# Patient Record
Sex: Female | Born: 1965
Health system: Southern US, Community
[De-identification: ages and names within clinical notes are randomized; demographics above are authoritative.]

## PROBLEM LIST (undated history)

## (undated) DIAGNOSIS — I2510 Atherosclerotic heart disease of native coronary artery without angina pectoris: Secondary | ICD-10-CM

## (undated) DIAGNOSIS — J189 Pneumonia, unspecified organism: Secondary | ICD-10-CM

## (undated) DIAGNOSIS — R112 Nausea with vomiting, unspecified: Secondary | ICD-10-CM

## (undated) DIAGNOSIS — E785 Hyperlipidemia, unspecified: Secondary | ICD-10-CM

## (undated) DIAGNOSIS — Z8489 Family history of other specified conditions: Secondary | ICD-10-CM

## (undated) DIAGNOSIS — G43909 Migraine, unspecified, not intractable, without status migrainosus: Secondary | ICD-10-CM

## (undated) DIAGNOSIS — F410 Panic disorder [episodic paroxysmal anxiety] without agoraphobia: Secondary | ICD-10-CM

## (undated) DIAGNOSIS — Z87442 Personal history of urinary calculi: Secondary | ICD-10-CM

## (undated) DIAGNOSIS — E119 Type 2 diabetes mellitus without complications: Secondary | ICD-10-CM

## (undated) DIAGNOSIS — L309 Dermatitis, unspecified: Secondary | ICD-10-CM

## (undated) DIAGNOSIS — F419 Anxiety disorder, unspecified: Secondary | ICD-10-CM

## (undated) DIAGNOSIS — I509 Heart failure, unspecified: Secondary | ICD-10-CM

## (undated) DIAGNOSIS — I10 Essential (primary) hypertension: Secondary | ICD-10-CM

## (undated) DIAGNOSIS — K219 Gastro-esophageal reflux disease without esophagitis: Secondary | ICD-10-CM

## (undated) DIAGNOSIS — T753XXA Motion sickness, initial encounter: Secondary | ICD-10-CM

## (undated) DIAGNOSIS — T8859XA Other complications of anesthesia, initial encounter: Secondary | ICD-10-CM

## (undated) DIAGNOSIS — M1990 Unspecified osteoarthritis, unspecified site: Secondary | ICD-10-CM

## (undated) DIAGNOSIS — Z9889 Other specified postprocedural states: Secondary | ICD-10-CM

## (undated) DIAGNOSIS — I5181 Takotsubo syndrome: Secondary | ICD-10-CM

## (undated) HISTORY — PX: TOE AMPUTATION: SHX809

## (undated) HISTORY — PX: CHOLECYSTECTOMY: SHX55

## (undated) HISTORY — PX: OTHER SURGICAL HISTORY: SHX169

## (undated) HISTORY — PX: KNEE ARTHROSCOPY W/ MENISCAL REPAIR: SHX1877

## (undated) HISTORY — DX: Essential (primary) hypertension: I10

## (undated) HISTORY — PX: TONSILLECTOMY: SUR1361

## (undated) HISTORY — PX: CARDIAC CATHETERIZATION: SHX172

---

## 1989-01-07 HISTORY — PX: NOSE SURGERY: SHX723

## 2004-12-08 ENCOUNTER — Emergency Department: Payer: Self-pay | Admitting: Emergency Medicine

## 2004-12-08 ENCOUNTER — Other Ambulatory Visit: Payer: Self-pay

## 2004-12-09 ENCOUNTER — Inpatient Hospital Stay: Payer: Self-pay | Admitting: Internal Medicine

## 2006-02-26 ENCOUNTER — Ambulatory Visit: Payer: Self-pay | Admitting: Family Medicine

## 2006-05-26 ENCOUNTER — Other Ambulatory Visit: Payer: Self-pay

## 2006-05-26 ENCOUNTER — Emergency Department: Payer: Self-pay | Admitting: Emergency Medicine

## 2006-05-28 ENCOUNTER — Inpatient Hospital Stay: Payer: Self-pay | Admitting: Internal Medicine

## 2006-10-15 ENCOUNTER — Ambulatory Visit: Payer: Self-pay | Admitting: Internal Medicine

## 2006-10-20 ENCOUNTER — Encounter: Payer: Self-pay | Admitting: Internal Medicine

## 2006-10-20 ENCOUNTER — Ambulatory Visit: Payer: Self-pay

## 2006-10-20 LAB — CONVERTED CEMR LAB
BUN: 13 mg/dL (ref 6–23)
CO2: 30 meq/L (ref 19–32)
Calcium: 9 mg/dL (ref 8.4–10.5)
Chloride: 106 meq/L (ref 96–112)
Creatinine, Ser: 0.7 mg/dL (ref 0.4–1.2)
GFR calc Af Amer: 119 mL/min
GFR calc non Af Amer: 98 mL/min
Glucose, Bld: 150 mg/dL — ABNORMAL HIGH (ref 70–99)
Potassium: 5.2 meq/L — ABNORMAL HIGH (ref 3.5–5.1)
Sodium: 142 meq/L (ref 135–145)

## 2006-10-21 ENCOUNTER — Ambulatory Visit: Payer: Self-pay

## 2006-10-29 ENCOUNTER — Ambulatory Visit: Payer: Self-pay | Admitting: Internal Medicine

## 2008-06-06 DIAGNOSIS — I509 Heart failure, unspecified: Secondary | ICD-10-CM

## 2008-06-06 HISTORY — DX: Heart failure, unspecified: I50.9

## 2008-08-09 DIAGNOSIS — I10 Essential (primary) hypertension: Secondary | ICD-10-CM | POA: Insufficient documentation

## 2008-11-23 ENCOUNTER — Emergency Department: Payer: Self-pay | Admitting: Emergency Medicine

## 2009-03-30 ENCOUNTER — Ambulatory Visit: Payer: Self-pay | Admitting: Unknown Physician Specialty

## 2009-04-10 ENCOUNTER — Ambulatory Visit: Payer: Self-pay | Admitting: Unknown Physician Specialty

## 2009-06-04 HISTORY — PX: CARDIAC CATHETERIZATION: SHX172

## 2009-09-07 ENCOUNTER — Ambulatory Visit: Payer: Self-pay | Admitting: Neurology

## 2010-03-02 ENCOUNTER — Ambulatory Visit: Payer: Self-pay | Admitting: Internal Medicine

## 2010-04-25 ENCOUNTER — Ambulatory Visit: Payer: Self-pay | Admitting: Family Medicine

## 2010-05-22 NOTE — Assessment & Plan Note (Signed)
[facility] OFFICE NOTE   Nancy [name]                  MRN:          [id number]  DATE:10/29/2006                            DOB:          December 14, 1965    PRIMARY CARE PHYSICIAN:  Dr. Lorie Phenix.   INTERVAL HISTORY:  Nancy [name] is a very pleasant 45 year old woman  with a history of obesity, diabetes, tobacco use and hypertension.  We  initially saw her several weeks ago for a preoperative surgical  evaluation and also evaluation of her hypertension.   She returns today to followup.  She has been very compliant with her  blood pressure medication, and although she noticed some fatigue feels  like she is getting used to it.  She denies any chest pain or shortness  of breath.  She had an adenosine Myoview which showed an ejection  fraction of 58%, there was some decreased uptake in the inferior basal  wall but this appeared to be a diaphragmatic attentuation and otherwise  appeared to be a normal stress test.  Echocardiogram showed an EF of  60%, RV was normal, there was no significant valvular disease.   CURRENT MEDICATIONS:  1. Metformin 1000 b.i.d.  2. Lisinopril HCTZ 20/12.5 b.i.d.   PHYSICAL EXAMINATION:  She is well-appearing, in no acute distress,  ambulates around the clinic without any respiratory difficulty.  Blood  pressure is 142/90, heart rate is 80.  Weight is 301 pounds.  HEENT:  Normal.  NECK:  Supple with no JVD, carotids are 2+ bilaterally without any  bruits, there is no lymphadenopathy or thyromegaly.  CARDIAC:  She has a regular rate and rhythm; no murmurs, rubs or  gallops; PMI is nonpalpable.  LUNGS:  Clear.  ABDOMEN:  Obese, nontender, nondistended.  I am unable to appreciate any  hepatosplenomegaly, no bruits, no masses.  EXTREMITIES:  Warm with no cyanosis, clubbing or edema.  NEURO:  Alert and oriented x3, cranial nerves II-XII are intact, moves  all 4 extremities  without difficulty, affect is very pleasant.   ASSESSMENT/PLAN:  1. Hypertension, much improved control.  We will add low dose Norvasc      at 2.5 mg a day and see her back in followup in a month or two.  2. Preoperative surgical evaluation.  Given her stress test and      echocardiogram she is at low risk for perioperative cardiac      complications and she can proceed without any further      cardiac testing.  3. Tobacco use.  Once again, reinforced the need for her to stop      smoking.     Nancy Buckles. Bensimhon, MD  Electronically Signed    DRB/MedQ  DD: 10/29/2006  DT: 10/30/2006  Job #: [id number]

## 2010-05-22 NOTE — Assessment & Plan Note (Signed)
[facility] OFFICE NOTE   Nancy [name], Nancy [name]                  MRN:          [id number]  DATE:10/15/2006                            DOB:          03-Aug-1965    REFERRING PHYSICIAN:  Christene Slates CARDIOLOGY CONSULTATION NOTE   REASON FOR CONSULTATION:  Preoperative surgical evaluation.   HISTORY OF PRESENT ILLNESS:  Nancy [name] is a delightful 45 year old  woman with multiple medical problems.  She has a history of severe  obesity.  She was previously about 470 pounds, and now has lost down to  306 pounds over the past 18 months.  She also has a history of diabetes,  hypertension, and recent tobacco use.  She denies any history of known  heart disease.  She has never had a stress test or cardiac  catheterization.   She has had problems with foot ulcers and infections, and she has been  referred for repair of her hammer toes to try to minimize these.  I do  not believe a date has been set yet.  She recently saw Dr. Elease Hashimoto, who  started her on some blood pressure medication, as her blood pressure was  quite elevated, but she has not been taking this regularly.  As  mentioned earlier, she has lost about 160 pounds over the past 18  months, but she has done this through diet and re-starting tobacco use,  and not exercise.  She says she is fairly active at work, though.  She  walks the floor all the time and does not have any significant chest  pressure or discomfort, and just minimal dyspnea.  She has not had  significant orthopnea, PND, lower extremity edema.   REVIEW OF SYSTEMS:  She denies any snoring or significant fatigue.  She  is still having regular periods.  She does have some arthritis pain and  depression, as well as constipation, asthma, and allergies.  The  remainder of the review of systems is negative, except for HPI and  problem list.   PROBLEM LIST:  1. Morbid obesity,  described above.  2. Hypertension.  3. Diabetes.  4. Tobacco use.  5. Medication noncompliance.   MEDICATIONS:  1. Metformin 1000 b.i.d.  2  Lisinopril 10 b.i.d.   ALLERGIES:  PENICILLIN.   SOCIAL HISTORY:  She lives with her partner here in [place].  She  has smoked one pack of cigarettes per day x2 years.  She had quit for  some time, but also has a remote history of tobacco use.  Just very  occasional alcohol use.  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   FAMILY HISTORY:  Mother is alive at 43 with back problems and  gastroesophageal reflux disease.  Father is alive at 12.  He has lung  cancer and Crohn's disease.  She has 2 sisters in their 5s, who are  healthy.  A brother is 12 and healthy.  A brother who died at age 74 due  to drowning.   PHYSICAL EXAM:  She is an  obese woman in no acute distress.  Gets around  the office fairly well without any respiratory difficulty.  Blood pressure is 184/100 with a manual cuff.  Heart rate is 64, weight  306.  HEENT:  Normal.  NECK:  Thick, supple.  There is no obvious JVD.  Carotids are 2+  bilaterally without bruits.  There is no lymphadenopathy or thyromegaly.  CARDIAC:  PMI is not palpable.  She has a regular rate and rhythm with a  2/6 systolic ejection murmur at the right sternal border.  S2 is well-  preserved.  LUNGS:  Clear.  ABDOMEN:  Obese, nontender, nondistended.  There is no obvious  hepatosplenomegaly.  No bruits.  No masses.  Good bowel sounds.  EXTREMITIES:  Warm with no cyanosis, clubbing, or edema.  Good distal  pulses.  No rash.  NEURO:  Alert and oriented x3.  Cranial nerves 2-12 are intact.  Moves  all 4 extremities without difficulty.  Affect is very pleasant.   EKG shows sinus bradycardia at a rate of 60.  No ST-T wave  abnormalities.   ASSESSMENT AND PLAN:  1. Preoperative cardiovascular risk stratification.  Given her age and      lack of family history, I think she is  probably low risk for any      perioperative cardiac complications.  However, she does have      multiple risk factors and we have decided to proceed with an      adenosine Myoview prior to her surgery.  We did discuss the      possibilities of a false positive given her size and breast      attentuation, but we will keep this in mind.  I do think it will be      important to get her blood pressure under better control prior to      surgery.  Unfortunately, her heart rate prohibits the use of a beta      blocker.  2. Hypertension.  This is quite elevated.  She is fairly noncompliant      with her medications and salt restriction.  We will start her on      lisinopril 20/hydrochlorothiazide 12.5 b.i.d. and see how it goes.      I told her she is probably going to need at least 3 or 4 agents.      We will see her back in 3 weeks for continued medication titration,      and probably add Norvasc next.  3. Murmur.  I think this is benign aortosclerosis.  I will get a 2D      echocardiogram.   DISPOSITION:  We will see her back in 3 weeks to recheck her blood  pressure.  We will get a BMET in 1 week to make sure her electrolytes  are stable with the addition of a diuretic.     Bevelyn Buckles. Bensimhon, MD  Electronically Signed    DRB/MedQ  DD: 10/15/2006  DT: 10/15/2006  Job #: [id number]   cc:   Lorie Phenix

## 2010-06-28 ENCOUNTER — Encounter: Payer: Self-pay | Admitting: Internal Medicine

## 2010-12-31 ENCOUNTER — Encounter (HOSPITAL_COMMUNITY): Payer: Self-pay | Admitting: Pharmacy Technician

## 2011-01-02 ENCOUNTER — Other Ambulatory Visit: Payer: Self-pay | Admitting: Orthopedic Surgery

## 2011-01-03 ENCOUNTER — Encounter (HOSPITAL_COMMUNITY)
Admission: RE | Admit: 2011-01-03 | Discharge: 2011-01-03 | Disposition: A | Discharge status: Home / Self Care | Payer: Worker's Compensation | Source: Ambulatory Visit | Attending: Orthopedic Surgery | Admitting: Orthopedic Surgery

## 2011-01-03 ENCOUNTER — Other Ambulatory Visit: Payer: Self-pay

## 2011-01-03 ENCOUNTER — Encounter (HOSPITAL_COMMUNITY): Payer: Self-pay

## 2011-01-03 HISTORY — DX: Unspecified osteoarthritis, unspecified site: M19.90

## 2011-01-03 HISTORY — DX: Anxiety disorder, unspecified: F41.9

## 2011-01-03 HISTORY — DX: Dermatitis, unspecified: L30.9

## 2011-01-03 LAB — CBC
MCH: 31.3 pg (ref 26.0–34.0)
MCHC: 34.3 g/dL (ref 30.0–36.0)
MCV: 91.2 fL (ref 78.0–100.0)
Platelets: 265 10*3/uL (ref 150–400)
RBC: 4.67 MIL/uL (ref 3.87–5.11)
RDW: 12.8 % (ref 11.5–15.5)

## 2011-01-03 LAB — DIFFERENTIAL
Basophils Absolute: 0.2 10*3/uL — ABNORMAL HIGH (ref 0.0–0.1)
Lymphocytes Relative: 31 % (ref 12–46)
Monocytes Absolute: 0.7 10*3/uL (ref 0.1–1.0)
Neutro Abs: 6.2 10*3/uL (ref 1.7–7.7)

## 2011-01-03 LAB — URINALYSIS, ROUTINE W REFLEX MICROSCOPIC
Bilirubin Urine: NEGATIVE
Hgb urine dipstick: NEGATIVE
Protein, ur: NEGATIVE mg/dL
Urobilinogen, UA: 0.2 mg/dL (ref 0.0–1.0)

## 2011-01-03 LAB — COMPREHENSIVE METABOLIC PANEL
AST: 12 U/L (ref 0–37)
CO2: 25 mEq/L (ref 19–32)
Calcium: 9.4 mg/dL (ref 8.4–10.5)
Creatinine, Ser: 0.62 mg/dL (ref 0.50–1.10)
GFR calc non Af Amer: >90 mL/min (ref >90)
Total Protein: 6.7 g/dL (ref 6.0–8.3)

## 2011-01-03 LAB — TYPE AND SCREEN
ABO/RH(D): A POS
Antibody Screen: NEGATIVE

## 2011-01-03 LAB — URINE MICROSCOPIC-ADD ON

## 2011-01-03 LAB — PROTIME-INR: Prothrombin Time: 13.1 seconds (ref 11.6–15.2)

## 2011-01-03 NOTE — Pre-Procedure Instructions (Signed)
19 Nancy [name]  01/03/2011   Your procedure is scheduled on:  Friday 01/11/11  Report to [facility] at 0630 AM.  Call this number if you have problems the morning of surgery: [phone]   Remember:   Do not eat food:After Midnight.  May have clear liquids: up to 4 Hours before arrival.  Clear liquids include soda, tea, black coffee, apple or grape juice, broth.  Take these medicines the morning of surgery with A SIP OF WATER: carvedilol,hydrcodone   Do not wear jewelry, make-up or nail polish.  Do not wear lotions, powders, or perfumes. You may wear deodorant.  Do not shave 48 hours prior to surgery.  Do not bring valuables to the hospital.  Contacts, dentures or bridgework may not be worn into surgery.  Leave suitcase in the car. After surgery it may be brought to your room.  For patients admitted to the hospital, checkout time is 11:00 AM the day of discharge.   Patients discharged the day of surgery will not be allowed to drive home.  Name and phone number of your driver: debra  Special Instructions: CHG Shower Use Special Wash: 1/2 bottle night before surgery and 1/2 bottle morning of surgery.   Please read over the following fact sheets that you were given: Pain Booklet, Coughing and Deep Breathing, Total Joint Packet, MRSA Information and Surgical Site Infection Prevention

## 2011-01-03 NOTE — Progress Notes (Signed)
Requested notes,echo dr Macky Lower [phone]

## 2011-01-04 NOTE — Consults (Signed)
Pt's chart reviewed, cardiac hx. Noted. H/O Takusubo cardiomyopathy. Cardiac cath non-obstructive 06/04/09, nl LV function by echo 05/14/10. Pt. Recently seen by Dr. Darrold Junker at the [facility]. Ok to proceed with surgery.                   Kipp Brood, MD

## 2011-01-10 MED ORDER — CLINDAMYCIN PHOSPHATE 600 MG/50ML IV SOLN
600.0000 mg | INTRAVENOUS | Status: AC
  Administered 2011-01-11: 600 mg via INTRAVENOUS
  Filled 2011-01-10: qty 50

## 2011-01-11 ENCOUNTER — Encounter (HOSPITAL_COMMUNITY): Payer: Self-pay | Admitting: *Deleted

## 2011-01-11 ENCOUNTER — Inpatient Hospital Stay (HOSPITAL_COMMUNITY)
Admission: RE | Admit: 2011-01-11 | Discharge: 2011-01-13 | DRG: 470 | Disposition: A | Discharge status: Home Health | Payer: Worker's Compensation | Source: Ambulatory Visit | Attending: Orthopedic Surgery | Admitting: Orthopedic Surgery

## 2011-01-11 ENCOUNTER — Encounter (HOSPITAL_COMMUNITY)
Admission: RE | Disposition: A | Discharge status: Home Health | Payer: Self-pay | Source: Ambulatory Visit | Attending: Orthopedic Surgery

## 2011-01-11 ENCOUNTER — Ambulatory Visit (HOSPITAL_COMMUNITY): Payer: Worker's Compensation | Admitting: Vascular Surgery

## 2011-01-11 ENCOUNTER — Encounter (HOSPITAL_COMMUNITY): Payer: Self-pay | Admitting: Anesthesiology

## 2011-01-11 DIAGNOSIS — Z7901 Long term (current) use of anticoagulants: Secondary | ICD-10-CM

## 2011-01-11 DIAGNOSIS — F411 Generalized anxiety disorder: Secondary | ICD-10-CM | POA: Diagnosis present

## 2011-01-11 DIAGNOSIS — Z79899 Other long term (current) drug therapy: Secondary | ICD-10-CM

## 2011-01-11 DIAGNOSIS — E119 Type 2 diabetes mellitus without complications: Secondary | ICD-10-CM | POA: Diagnosis present

## 2011-01-11 DIAGNOSIS — F17200 Nicotine dependence, unspecified, uncomplicated: Secondary | ICD-10-CM | POA: Diagnosis present

## 2011-01-11 DIAGNOSIS — I10 Essential (primary) hypertension: Secondary | ICD-10-CM | POA: Diagnosis present

## 2011-01-11 DIAGNOSIS — M1710 Unilateral primary osteoarthritis, unspecified knee: Principal | ICD-10-CM | POA: Diagnosis present

## 2011-01-11 DIAGNOSIS — L259 Unspecified contact dermatitis, unspecified cause: Secondary | ICD-10-CM | POA: Diagnosis present

## 2011-01-11 DIAGNOSIS — J45909 Unspecified asthma, uncomplicated: Secondary | ICD-10-CM | POA: Diagnosis present

## 2011-01-11 HISTORY — PX: TOTAL KNEE ARTHROPLASTY: SHX125

## 2011-01-11 LAB — HCG, SERUM, QUALITATIVE: Preg, Serum: NEGATIVE

## 2011-01-11 LAB — GLUCOSE, CAPILLARY
Glucose-Capillary: 182 mg/dL — ABNORMAL HIGH (ref 70–99)
Glucose-Capillary: 218 mg/dL — ABNORMAL HIGH (ref 70–99)
Glucose-Capillary: 197 mg/dL — ABNORMAL HIGH (ref 70–99)

## 2011-01-11 SURGERY — ARTHROPLASTY, KNEE, TOTAL
Anesthesia: General | Site: Knee | Laterality: Right | Wound class: Clean

## 2011-01-11 MED ORDER — MEPERIDINE HCL 25 MG/ML IJ SOLN: 6.2500 mg | INTRAMUSCULAR | Status: DC | PRN

## 2011-01-11 MED ORDER — ONDANSETRON HCL 4 MG PO TABS: 4.0000 mg | ORAL_TABLET | Freq: Four times a day (QID) | ORAL | Status: DC | PRN

## 2011-01-11 MED ORDER — PATIENT'S GUIDE TO USING COUMADIN BOOK
Freq: Once | Status: AC
  Administered 2011-01-11: 18:00:00
  Filled 2011-01-11: qty 1

## 2011-01-11 MED ORDER — FENTANYL CITRATE 0.05 MG/ML IJ SOLN
INTRAMUSCULAR | Status: DC | PRN
  Administered 2011-01-11: 150 ug via INTRAVENOUS
  Administered 2011-01-11: 100 ug via INTRAVENOUS
  Administered 2011-01-11 (×2): 50 ug via INTRAVENOUS

## 2011-01-11 MED ORDER — SODIUM CHLORIDE 0.9 % IJ SOLN: 9.0000 mL | INTRAMUSCULAR | Status: DC | PRN

## 2011-01-11 MED ORDER — ONDANSETRON HCL 4 MG/2ML IJ SOLN: 4.0000 mg | Freq: Once | INTRAMUSCULAR | Status: DC | PRN

## 2011-01-11 MED ORDER — DEXTROSE 5 % IV SOLN
500.0000 mg | INTRAVENOUS | Status: AC
  Administered 2011-01-11: 500 mg via INTRAVENOUS
  Filled 2011-01-11: qty 5

## 2011-01-11 MED ORDER — IPRATROPIUM-ALBUTEROL 18-103 MCG/ACT IN AERO
INHALATION_SPRAY | RESPIRATORY_TRACT | Status: DC | PRN
  Administered 2011-01-11 (×2): 4 via RESPIRATORY_TRACT

## 2011-01-11 MED ORDER — DOCUSATE SODIUM 100 MG PO CAPS
100.0000 mg | ORAL_CAPSULE | Freq: Two times a day (BID) | ORAL | Status: DC
  Administered 2011-01-11 – 2011-01-13 (×3): 100 mg via ORAL
  Filled 2011-01-11 (×5): qty 1

## 2011-01-11 MED ORDER — METFORMIN HCL 500 MG PO TABS: 1000.0000 mg | ORAL_TABLET | Freq: Two times a day (BID) | ORAL | Status: DC

## 2011-01-11 MED ORDER — VITAMIN B-12 1000 MCG PO TABS
1000.0000 ug | ORAL_TABLET | Freq: Two times a day (BID) | ORAL | Status: DC
  Administered 2011-01-11 – 2011-01-13 (×4): 1000 ug via ORAL
  Filled 2011-01-11 (×5): qty 1

## 2011-01-11 MED ORDER — METOCLOPRAMIDE HCL 5 MG/ML IJ SOLN
5.0000 mg | Freq: Three times a day (TID) | INTRAMUSCULAR | Status: DC | PRN
  Filled 2011-01-11: qty 2

## 2011-01-11 MED ORDER — TRIAMCINOLONE ACETONIDE 0.025 % EX CREA
1.0000 "application " | TOPICAL_CREAM | Freq: Every day | CUTANEOUS | Status: DC | PRN
  Filled 2011-01-11: qty 15

## 2011-01-11 MED ORDER — BUPIVACAINE-EPINEPHRINE PF 0.5-1:200000 % IJ SOLN
INTRAMUSCULAR | Status: DC | PRN
  Administered 2011-01-11: 30 mL

## 2011-01-11 MED ORDER — DIAZEPAM 5 MG PO TABS: 10.0000 mg | ORAL_TABLET | Freq: Four times a day (QID) | ORAL | Status: DC | PRN

## 2011-01-11 MED ORDER — MORPHINE SULFATE 2 MG/ML IJ SOLN: 0.0500 mg/kg | INTRAMUSCULAR | Status: DC | PRN

## 2011-01-11 MED ORDER — GLIMEPIRIDE 2 MG PO TABS
2.0000 mg | ORAL_TABLET | ORAL | Status: DC
  Administered 2011-01-12 – 2011-01-13 (×2): 2 mg via ORAL
  Filled 2011-01-11 (×3): qty 1

## 2011-01-11 MED ORDER — CARVEDILOL 3.125 MG PO TABS
3.1250 mg | ORAL_TABLET | Freq: Two times a day (BID) | ORAL | Status: DC
  Administered 2011-01-11 – 2011-01-13 (×4): 3.125 mg via ORAL
  Filled 2011-01-11 (×6): qty 1

## 2011-01-11 MED ORDER — DROPERIDOL 2.5 MG/ML IJ SOLN
INTRAMUSCULAR | Status: DC | PRN
  Administered 2011-01-11: 0.625 mg via INTRAVENOUS

## 2011-01-11 MED ORDER — SIMVASTATIN 20 MG PO TABS
20.0000 mg | ORAL_TABLET | Freq: Every day | ORAL | Status: DC
  Administered 2011-01-11 – 2011-01-12 (×2): 20 mg via ORAL
  Filled 2011-01-11 (×3): qty 1

## 2011-01-11 MED ORDER — KETOROLAC TROMETHAMINE 15 MG/ML IJ SOLN
15.0000 mg | Freq: Three times a day (TID) | INTRAMUSCULAR | Status: DC
  Administered 2011-01-11 – 2011-01-13 (×4): 15 mg via INTRAVENOUS
  Filled 2011-01-11 (×9): qty 1

## 2011-01-11 MED ORDER — ACETAMINOPHEN 10 MG/ML IV SOLN
INTRAVENOUS | Status: DC | PRN
  Administered 2011-01-11: 1000 mg via INTRAVENOUS

## 2011-01-11 MED ORDER — ACETAMINOPHEN 650 MG RE SUPP: 650.0000 mg | Freq: Four times a day (QID) | RECTAL | Status: DC | PRN

## 2011-01-11 MED ORDER — METHOCARBAMOL 100 MG/ML IJ SOLN
500.0000 mg | Freq: Four times a day (QID) | INTRAVENOUS | Status: DC | PRN
  Administered 2011-01-11: 500 mg via INTRAVENOUS

## 2011-01-11 MED ORDER — METFORMIN HCL 500 MG PO TABS
1000.0000 mg | ORAL_TABLET | Freq: Every day | ORAL | Status: DC
  Administered 2011-01-12 – 2011-01-13 (×2): 1000 mg via ORAL
  Filled 2011-01-11 (×3): qty 2

## 2011-01-11 MED ORDER — FERROUS SULFATE 325 (65 FE) MG PO TABS
325.0000 mg | ORAL_TABLET | Freq: Two times a day (BID) | ORAL | Status: DC
  Administered 2011-01-11 – 2011-01-13 (×4): 325 mg via ORAL
  Filled 2011-01-11 (×6): qty 1

## 2011-01-11 MED ORDER — ACETAMINOPHEN 325 MG PO TABS
650.0000 mg | ORAL_TABLET | Freq: Four times a day (QID) | ORAL | Status: DC | PRN
  Administered 2011-01-13: 650 mg via ORAL
  Filled 2011-01-11: qty 2

## 2011-01-11 MED ORDER — HYDROMORPHONE 0.3 MG/ML IV SOLN
INTRAVENOUS | Status: DC
  Administered 2011-01-11: 25 mL via INTRAVENOUS
  Administered 2011-01-11: 6.3 mg via INTRAVENOUS
  Administered 2011-01-11: 2.75 mg via INTRAVENOUS
  Administered 2011-01-11: 11:00:00 via INTRAVENOUS
  Administered 2011-01-12: 2.1 mg via INTRAVENOUS
  Administered 2011-01-12: 0.6 mg via INTRAVENOUS
  Administered 2011-01-12: 3 mg via INTRAVENOUS
  Filled 2011-01-11 (×3): qty 25

## 2011-01-11 MED ORDER — METHOCARBAMOL 500 MG PO TABS
500.0000 mg | ORAL_TABLET | Freq: Four times a day (QID) | ORAL | Status: DC | PRN
  Administered 2011-01-12 – 2011-01-13 (×3): 500 mg via ORAL
  Filled 2011-01-11 (×3): qty 1

## 2011-01-11 MED ORDER — ZOLPIDEM TARTRATE 5 MG PO TABS: 5.0000 mg | ORAL_TABLET | Freq: Every evening | ORAL | Status: DC | PRN

## 2011-01-11 MED ORDER — METOCLOPRAMIDE HCL 10 MG PO TABS
5.0000 mg | ORAL_TABLET | Freq: Three times a day (TID) | ORAL | Status: DC | PRN
  Administered 2011-01-11: 10 mg via ORAL
  Filled 2011-01-11: qty 1

## 2011-01-11 MED ORDER — ONDANSETRON HCL 4 MG/2ML IJ SOLN
4.0000 mg | Freq: Four times a day (QID) | INTRAMUSCULAR | Status: DC | PRN
  Filled 2011-01-11 (×2): qty 2

## 2011-01-11 MED ORDER — DIAZEPAM 5 MG PO TABS
10.0000 mg | ORAL_TABLET | Freq: Four times a day (QID) | ORAL | Status: DC | PRN
  Administered 2011-01-11 – 2011-01-13 (×4): 10 mg via ORAL
  Filled 2011-01-11 (×4): qty 2

## 2011-01-11 MED ORDER — CLINDAMYCIN PHOSPHATE 600 MG/50ML IV SOLN
600.0000 mg | Freq: Four times a day (QID) | INTRAVENOUS | Status: AC
  Administered 2011-01-11 – 2011-01-12 (×3): 600 mg via INTRAVENOUS
  Filled 2011-01-11 (×3): qty 50

## 2011-01-11 MED ORDER — DIPHENHYDRAMINE HCL 50 MG/ML IJ SOLN
12.5000 mg | Freq: Four times a day (QID) | INTRAMUSCULAR | Status: DC | PRN
  Administered 2011-01-12: 12.5 mg via INTRAVENOUS
  Filled 2011-01-11: qty 1

## 2011-01-11 MED ORDER — METFORMIN HCL 500 MG PO TABS
1500.0000 mg | ORAL_TABLET | Freq: Every day | ORAL | Status: DC
  Administered 2011-01-11 – 2011-01-12 (×2): 1500 mg via ORAL
  Filled 2011-01-11 (×3): qty 3

## 2011-01-11 MED ORDER — ONDANSETRON HCL 4 MG/2ML IJ SOLN
4.0000 mg | Freq: Four times a day (QID) | INTRAMUSCULAR | Status: DC | PRN
  Administered 2011-01-11 – 2011-01-12 (×2): 4 mg via INTRAVENOUS

## 2011-01-11 MED ORDER — GLYCOPYRROLATE 0.2 MG/ML IJ SOLN
INTRAMUSCULAR | Status: DC | PRN
  Administered 2011-01-11: .4 mg via INTRAVENOUS

## 2011-01-11 MED ORDER — NEOSTIGMINE METHYLSULFATE 1 MG/ML IJ SOLN
INTRAMUSCULAR | Status: DC | PRN
  Administered 2011-01-11: 3 mg via INTRAVENOUS

## 2011-01-11 MED ORDER — SODIUM CHLORIDE 0.9 % IR SOLN
Status: DC | PRN
  Administered 2011-01-11: 1000 mL

## 2011-01-11 MED ORDER — DIPHENHYDRAMINE HCL 12.5 MG/5ML PO ELIX
12.5000 mg | ORAL_SOLUTION | Freq: Four times a day (QID) | ORAL | Status: DC | PRN
  Filled 2011-01-11: qty 5

## 2011-01-11 MED ORDER — ACETAMINOPHEN 10 MG/ML IV SOLN
1000.0000 mg | Freq: Four times a day (QID) | INTRAVENOUS | Status: AC
  Administered 2011-01-11 – 2011-01-12 (×3): 1000 mg via INTRAVENOUS
  Filled 2011-01-11 (×3): qty 100

## 2011-01-11 MED ORDER — ONDANSETRON HCL 4 MG/2ML IJ SOLN
INTRAMUSCULAR | Status: DC | PRN
  Administered 2011-01-11: 4 mg via INTRAVENOUS

## 2011-01-11 MED ORDER — INSULIN ASPART 100 UNIT/ML ~~LOC~~ SOLN
0.0000 [IU] | Freq: Three times a day (TID) | SUBCUTANEOUS | Status: DC
  Administered 2011-01-11: 3 [IU] via SUBCUTANEOUS
  Administered 2011-01-12: 2 [IU] via SUBCUTANEOUS
  Administered 2011-01-12: 5 [IU] via SUBCUTANEOUS
  Administered 2011-01-13: 2 [IU] via SUBCUTANEOUS
  Administered 2011-01-13: 3 [IU] via SUBCUTANEOUS
  Filled 2011-01-11: qty 3

## 2011-01-11 MED ORDER — NALOXONE HCL 0.4 MG/ML IJ SOLN: 0.4000 mg | INTRAMUSCULAR | Status: DC | PRN

## 2011-01-11 MED ORDER — LISINOPRIL 5 MG PO TABS
5.0000 mg | ORAL_TABLET | Freq: Two times a day (BID) | ORAL | Status: DC
  Administered 2011-01-11 – 2011-01-13 (×4): 5 mg via ORAL
  Filled 2011-01-11 (×5): qty 1

## 2011-01-11 MED ORDER — WARFARIN SODIUM 7.5 MG PO TABS
7.5000 mg | ORAL_TABLET | Freq: Once | ORAL | Status: AC
  Administered 2011-01-11: 7.5 mg via ORAL
  Filled 2011-01-11: qty 1

## 2011-01-11 MED ORDER — HYDROMORPHONE HCL PF 1 MG/ML IJ SOLN: 0.2500 mg | INTRAMUSCULAR | Status: DC | PRN

## 2011-01-11 MED ORDER — MIDAZOLAM HCL 5 MG/5ML IJ SOLN
INTRAMUSCULAR | Status: DC | PRN
  Administered 2011-01-11 (×2): 2 mg via INTRAVENOUS

## 2011-01-11 MED ORDER — ACETAMINOPHEN 10 MG/ML IV SOLN
INTRAVENOUS | Status: AC
  Filled 2011-01-11: qty 100

## 2011-01-11 MED ORDER — BUPIVACAINE HCL (PF) 0.25 % IJ SOLN
INTRAMUSCULAR | Status: DC | PRN
  Administered 2011-01-11: 30 mL

## 2011-01-11 MED ORDER — PROPOFOL 10 MG/ML IV EMUL
INTRAVENOUS | Status: DC | PRN
  Administered 2011-01-11: 200 mg via INTRAVENOUS

## 2011-01-11 MED ORDER — LACTATED RINGERS IV SOLN
INTRAVENOUS | Status: DC | PRN
  Administered 2011-01-11 (×3): via INTRAVENOUS

## 2011-01-11 MED ORDER — POVIDONE-IODINE 7.5 % EX SOLN
Freq: Once | CUTANEOUS | Status: DC
  Filled 2011-01-11: qty 118

## 2011-01-11 MED ORDER — ROCURONIUM BROMIDE 100 MG/10ML IV SOLN
INTRAVENOUS | Status: DC | PRN
  Administered 2011-01-11: 50 mg via INTRAVENOUS

## 2011-01-11 MED ORDER — OXYCODONE HCL 5 MG PO TABS
5.0000 mg | ORAL_TABLET | ORAL | Status: DC | PRN
  Administered 2011-01-12 – 2011-01-13 (×6): 10 mg via ORAL
  Filled 2011-01-11 (×6): qty 2

## 2011-01-11 MED ORDER — SODIUM CHLORIDE 0.9 % IV SOLN: INTRAVENOUS | Status: DC

## 2011-01-11 SURGICAL SUPPLY — 69 items
APL SKNCLS STERI-STRIP NONHPOA (GAUZE/BANDAGES/DRESSINGS) ×1
BANDAGE ELASTIC 6 VELCRO ST LF (GAUZE/BANDAGES/DRESSINGS) ×1 IMPLANT
BANDAGE ESMARK 6X9 LF (GAUZE/BANDAGES/DRESSINGS) ×1 IMPLANT
BENZOIN TINCTURE PRP APPL 2/3 (GAUZE/BANDAGES/DRESSINGS) ×2 IMPLANT
BLADE SAGITTAL 25.0X1.19X90 (BLADE) ×2 IMPLANT
BLADE SAW SAG 90X13X1.27 (BLADE) ×2 IMPLANT
BNDG CMPR 9X6 STRL LF SNTH (GAUZE/BANDAGES/DRESSINGS) ×1
BNDG ESMARK 6X9 LF (GAUZE/BANDAGES/DRESSINGS) ×2
BOWL SMART MIX CTS (DISPOSABLE) ×2 IMPLANT
CEMENT HV SMART SET (Cement) ×4 IMPLANT
CLOSURE STERI STRIP 1/2 X4 (GAUZE/BANDAGES/DRESSINGS) ×1 IMPLANT
CLOTH BEACON ORANGE TIMEOUT ST (SAFETY) ×2 IMPLANT
COVER BACK TABLE 24X17X13 BIG (DRAPES) IMPLANT
COVER SURGICAL LIGHT HANDLE (MISCELLANEOUS) ×2 IMPLANT
CUFF TOURNIQUET SINGLE 34IN LL (TOURNIQUET CUFF) ×2 IMPLANT
CUFF TOURNIQUET SINGLE 44IN (TOURNIQUET CUFF) IMPLANT
DRAPE EXTREMITY T 121X128X90 (DRAPE) ×2 IMPLANT
DRAPE U-SHAPE 47X51 STRL (DRAPES) ×2 IMPLANT
DRSG PAD ABDOMINAL 8X10 ST (GAUZE/BANDAGES/DRESSINGS) ×2 IMPLANT
DURAPREP 26ML APPLICATOR (WOUND CARE) ×2 IMPLANT
ELECT REM PT RETURN 9FT ADLT (ELECTROSURGICAL) ×2
ELECTRODE REM PT RTRN 9FT ADLT (ELECTROSURGICAL) ×1 IMPLANT
EVACUATOR 1/8 PVC DRAIN (DRAIN) ×2 IMPLANT
FACESHIELD LNG OPTICON STERILE (SAFETY) ×2 IMPLANT
GAUZE SPONGE 4X4 12PLY STRL LF (GAUZE/BANDAGES/DRESSINGS) ×1 IMPLANT
GAUZE XEROFORM 5X9 LF (GAUZE/BANDAGES/DRESSINGS) ×2 IMPLANT
GLOVE BIOGEL PI IND STRL 8 (GLOVE) ×2 IMPLANT
GLOVE BIOGEL PI INDICATOR 8 (GLOVE) ×2
GLOVE ECLIPSE 7.5 STRL STRAW (GLOVE) ×4 IMPLANT
GOWN PREVENTION PLUS XLARGE (GOWN DISPOSABLE) ×2 IMPLANT
GOWN SRG XL XLNG 56XLVL 4 (GOWN DISPOSABLE) ×1 IMPLANT
GOWN STRL NON-REIN LRG LVL3 (GOWN DISPOSABLE) ×2 IMPLANT
GOWN STRL NON-REIN XL XLG LVL4 (GOWN DISPOSABLE) ×2
HANDPIECE INTERPULSE COAX TIP (DISPOSABLE) ×2
HOOD PEEL AWAY FACE SHEILD DIS (HOOD) ×6 IMPLANT
IMMOBILIZER KNEE 20 (SOFTGOODS)
IMMOBILIZER KNEE 20 THIGH 36 (SOFTGOODS) IMPLANT
IMMOBILIZER KNEE 22 UNIV (SOFTGOODS) ×2 IMPLANT
IMMOBILIZER KNEE 24 THIGH 36 (MISCELLANEOUS) IMPLANT
IMMOBILIZER KNEE 24 UNIV (MISCELLANEOUS)
KIT BASIN OR (CUSTOM PROCEDURE TRAY) ×2 IMPLANT
KIT ROOM TURNOVER OR (KITS) ×2 IMPLANT
MANIFOLD NEPTUNE II (INSTRUMENTS) ×2 IMPLANT
MARKER SPHERE PSV REFLC THRD 5 (MARKER) ×6 IMPLANT
NDL HYPO 25GX1X1/2 BEV (NEEDLE) IMPLANT
NEEDLE HYPO 25GX1X1/2 BEV (NEEDLE) IMPLANT
NS IRRIG 1000ML POUR BTL (IV SOLUTION) ×2 IMPLANT
PACK TOTAL JOINT (CUSTOM PROCEDURE TRAY) ×2 IMPLANT
PAD ARMBOARD 7.5X6 YLW CONV (MISCELLANEOUS) ×4 IMPLANT
PAD CAST 4YDX4 CTTN HI CHSV (CAST SUPPLIES) ×1 IMPLANT
PADDING CAST COTTON 4X4 STRL (CAST SUPPLIES) ×2
PADDING CAST COTTON 6X4 STRL (CAST SUPPLIES) ×1 IMPLANT
PIN SCHANZ 4MM 130MM (PIN) ×8 IMPLANT
SET HNDPC FAN SPRY TIP SCT (DISPOSABLE) ×1 IMPLANT
SPONGE GAUZE 4X4 12PLY (GAUZE/BANDAGES/DRESSINGS) ×2 IMPLANT
STAPLER VISISTAT 35W (STAPLE) IMPLANT
STRIP CLOSURE SKIN 1/2X4 (GAUZE/BANDAGES/DRESSINGS) ×2 IMPLANT
SUCTION FRAZIER TIP 10 FR DISP (SUCTIONS) ×2 IMPLANT
SUT MON AB 3-0 SH 27 (SUTURE)
SUT MON AB 3-0 SH27 (SUTURE) IMPLANT
SUT VIC AB 0 CTB1 27 (SUTURE) ×4 IMPLANT
SUT VIC AB 1 CT1 27 (SUTURE) ×4
SUT VIC AB 1 CT1 27XBRD ANBCTR (SUTURE) ×2 IMPLANT
SUT VIC AB 2-0 CTB1 (SUTURE) ×4 IMPLANT
SYR CONTROL 10ML LL (SYRINGE) IMPLANT
TOWEL OR 17X24 6PK STRL BLUE (TOWEL DISPOSABLE) ×2 IMPLANT
TOWEL OR 17X26 10 PK STRL BLUE (TOWEL DISPOSABLE) ×2 IMPLANT
TRAY FOLEY CATH 14FR (SET/KITS/TRAYS/PACK) ×2 IMPLANT
WATER STERILE IRR 1000ML POUR (IV SOLUTION) ×6 IMPLANT

## 2011-01-11 NOTE — Progress Notes (Signed)
ANTICOAGULATION CONSULT NOTE - Initial Consult  Pharmacy Consult for Coumadin  Indication: VTE prophlyaxis s/p TKA  Allergies  Allergen Reactions  . Morphine And Related     Reaction:Severe migraine  . Shellfish Allergy Swelling  . Penicillins Rash  . Theophyllines Itching and Rash    Patient Measurements: Weight: 306 lb (138.801 kg)  Vital Signs: Temp: 98.1 F (36.7 C) (01/04 1304) Temp src: Oral (01/04 0558) BP: 133/58 mmHg (01/04 1304) Pulse Rate: 88  (01/04 1304)  Labs: No results found for this basename: HGB:2,HCT:3,PLT:3,APTT:3,LABPROT:3,INR:3,HEPARINUNFRC:3,CREATININE:3,CKTOTAL:3,CKMB:3,TROPONINI:3 in the last 72 hours CrCl is unknown because there is no height on file for the current visit.  Medical History: Past Medical History  Diagnosis Date  . Diabetes mellitus   . Eczema   . Arthritis     r knee  . Anxiety     panic attacks  . Asthma      tx er 08/2010  . Unspecified essential hypertension     FOLLOWED BY  [facility].PREVIOUSLY SEEN BY [place].ECHO 2008    Medications:  Scheduled:    . acetaminophen  1,000 mg Intravenous Q6H  . carvedilol  3.125 mg Oral BID WC  . clindamycin (CLEOCIN) IV  600 mg Intravenous 60 min Pre-Op  . clindamycin (CLEOCIN) IV  600 mg Intravenous Q6H  . docusate sodium  100 mg Oral BID  . ferrous sulfate  325 mg Oral BID WC  . glimepiride  2 mg Oral Q0700  . HYDROmorphone PCA 0.3 mg/mL   Intravenous Q4H  . insulin aspart  0-15 Units Subcutaneous TID WC  . ketorolac  15 mg Intravenous Q8H  . lisinopril  5 mg Oral BID  . metFORMIN  1,000 mg Oral Q breakfast  . metFORMIN  1,500 mg Oral Q supper  . methocarbamol(ROBAXIN) IV  500 mg Intravenous To PACU  . simvastatin  20 mg Oral q1800  . vitamin B-12  1,000 mcg Oral BID  . DISCONTD: metFORMIN  1,000-1,500 mg Oral BID  . DISCONTD: povidone-iodine   Topical Once    Assessment: 46 yr old female on coumadin for VTE prophylaxis s/p TKA.  Goal of Therapy:  INR 2-3     Plan:  Coumadin 7.5mg  today x 1 dose. Daily PT/INR.   Wendie Simmer, PharmD, BCPS Clinical Pharmacist  Pager: [phone]  01/11/2011,2:12 PM

## 2011-01-11 NOTE — Plan of Care (Signed)
Problem: Consults Goal: Diagnosis- Total Joint Replacement Primary Total Knee     

## 2011-01-11 NOTE — H&P (Signed)
PREOPERATIVE H&P  Chief Complaint: r. Knee pain  HPI: Nancy [name] is a 46 y.o. female who presents for evaluation of r. Knee pain. It has been present for greater than 1 year and has been worsening. She has failed conservative measures and has bone on bone arthritis . Pain is rated as severe.  Past Medical History  Diagnosis Date  . Diabetes mellitus   . Eczema   . Arthritis     r knee  . Anxiety     panic attacks  . Asthma      tx er 08/2010  . Unspecified essential hypertension     FOLLOWED BY  [facility].PREVIOUSLY SEEN BY [place].ECHO 2008   Past Surgical History  Procedure Date  . Cholecystectomy   . Kidney stone removal   . Tonsillectomy   . Cardiac catheterization     2011 followed by dr bensimohn  . Knee arthroscopy w/ meniscal repair    History   Social History  . Marital Status: Single    Spouse Name: N/A    Number of Children: N/A  . Years of Education: N/A   Social History Main Topics  . Smoking status: Smoker, Current Status Unknown  . Smokeless tobacco: None  . Alcohol Use: Yes  . Drug Use: No  . Sexually Active:    Other Topics Concern  . None   Social History Narrative   Full time. Does not regularly exercise. Domestic partner.    Family History  Problem Relation Age of Onset  . Cancer Other   . Crohn's disease Other   . GER disease Other    Allergies  Allergen Reactions  . Morphine And Related     Reaction:Severe migraine  . Shellfish Allergy Swelling  . Penicillins Rash  . Theophyllines Itching and Rash   Prior to Admission medications   Medication Sig Start Date End Date Taking? Authorizing Provider  carvedilol (COREG) 3.125 MG tablet Take 3.125 mg by mouth 2 (two) times daily with a meal.     Yes Historical Provider, MD  Clobetasol Prop Crea-Coal Tar (CLOBETA CREAM EX) Apply 1 application topically daily as needed. For eczema. Apply to hands and feet.    Yes Historical Provider, MD  diazepam (VALIUM) 10 MG tablet  Take 10 mg by mouth every 6 (six) hours as needed. Patient took 1/2 tablet 01/09/10.    Yes Historical Provider, MD  glimepiride (AMARYL) 2 MG tablet Take 2 mg by mouth every morning.     Yes Historical Provider, MD  lisinopril (PRINIVIL,ZESTRIL) 5 MG tablet Take 5 mg by mouth 2 (two) times daily.     Yes Historical Provider, MD  metFORMIN (GLUCOPHAGE) 1000 MG tablet Take 1,000-1,500 mg by mouth 2 (two) times daily. Take 1 tab in the morning & 1.5 tab at night    Yes Historical Provider, MD  pravastatin (PRAVACHOL) 40 MG tablet Take 40 mg by mouth at bedtime.     Yes Historical Provider, MD  triamcinolone (KENALOG) 0.025 % cream Apply 1 application topically daily as needed. For eczema. Apply to hands and feet.    Yes Historical Provider, MD  Wound Dressings (AMERIGEL WOUND DRESSING) GEL Apply 1 application topically daily as needed. For eczema and wound care. Apply to hands and feet.    Yes Historical Provider, MD  aspirin EC 81 MG tablet Take 81 mg by mouth at bedtime.      Historical Provider, MD  clindamycin (CLEOCIN) 150 MG capsule Take 150 mg by mouth  3 (three) times daily.      Historical Provider, MD  HYDROcodone-acetaminophen (NORCO) 5-325 MG per tablet Take 1 tablet by mouth every 6 (six) hours as needed.      Historical Provider, MD  ibuprofen (ADVIL,MOTRIN) 800 MG tablet Take 800 mg by mouth every 8 (eight) hours as needed.      Historical Provider, MD  vitamin B-12 (CYANOCOBALAMIN) 1000 MCG tablet Take 1,000 mcg by mouth 2 (two) times daily.      Historical Provider, MD     Positive ROS: none  All other systems have been reviewed and were otherwise negative with the exception of those mentioned in the HPI and as above.  Physical Exam: Filed Vitals:   01/11/11 0558  BP: 163/84  Pulse: 67  Temp: 97.7 F (36.5 C)  Resp: 18    General: Alert, no acute distress Cardiovascular: No pedal edema Respiratory: No cyanosis, no use of accessory musculature GI: No organomegaly, abdomen  is soft and non-tender Skin: No lesions in the area of chief complaint Neurologic: Sensation intact distally Psychiatric: Patient is competent for consent with normal mood and affect Lymphatic: No axillary or cervical lymphadenopathy  MUSCULOSKELETAL: r. Knee pain om rom 5-95 rom med jt line tender  Assessment/Plan: degenerative joint disease Right knee Plan for Procedure(s): TOTAL KNEE ARTHROPLASTY with computer assist because of young age  The risks benefits and alternatives were discussed with the patient including but not limited to the risks of nonoperative treatment, versus surgical intervention including infection, bleeding, nerve injury, malunion, nonunion, hardware prominence, hardware failure, need for hardware removal, blood clots, cardiopulmonary complications, morbidity, mortality, among others, and they were willing to proceed.  Predicted outcome is good, although there will be at least a six to nine month expected recovery.  Jermel Artley L 01/11/2011 8:09 AM

## 2011-01-11 NOTE — Preoperative (Signed)
Beta Blockers   Reason not to administer Beta Blockers:Not Applicable, Took carvedilol 01/11/11 am

## 2011-01-11 NOTE — Anesthesia Preprocedure Evaluation (Addendum)
Anesthesia Evaluation  Patient identified by MRN, date of birth, ID band Patient awake    Reviewed: Allergy & Precautions, H&P , NPO status , Patient's Chart, lab work & pertinent test results, reviewed documented beta blocker date and time   Airway Mallampati: II TM Distance: >3 FB Neck ROM: Full    Dental No notable dental hx. (+) Teeth Intact and Dental Advisory Given   Pulmonary  clear to auscultation  Pulmonary exam normal       Cardiovascular Regular Normal    Neuro/Psych    GI/Hepatic   Endo/Other  Morbid obesity  Renal/GU      Musculoskeletal   Abdominal   Peds  Hematology   Anesthesia Other Findings   Reproductive/Obstetrics                           Anesthesia Physical Anesthesia Plan  ASA: III  Anesthesia Plan: General   Post-op Pain Management:    Induction: Intravenous  Airway Management Planned: Oral ETT  Additional Equipment:   Intra-op Plan:   Post-operative Plan: Extubation in OR  Informed Consent: I have reviewed the patients History and Physical, chart, labs and discussed the procedure including the risks, benefits and alternatives for the proposed anesthesia with the patient or authorized representative who has indicated his/her understanding and acceptance.   Dental advisory given  Plan Discussed with: CRNA, Anesthesiologist and Surgeon  Anesthesia Plan Comments:         Anesthesia Quick Evaluation

## 2011-01-11 NOTE — Anesthesia Procedure Notes (Addendum)
Anesthesia Regional Block:  Femoral nerve block  Pre-Anesthetic Checklist: ,, timeout performed, Correct Patient, Correct Site, Correct Laterality, Correct Procedure, Correct Position, site marked, Risks and benefits discussed,  Surgical consent,  Pre-op evaluation,  At surgeon's request and post-op pain management  Laterality: Right and Lower  Prep: chloraprep       Needles:  Injection technique: Single-shot  Needle Type: Echogenic Needle     Needle Length: 9cm  Needle Gauge: 22 and 22 G    Additional Needles:  Procedures: ultrasound guided Femoral nerve block Narrative:  Injection made incrementally with aspirations every 5 mL.  Performed by: Personally  Anesthesiologist: Sheldon Silvan, MD  Additional Notes: Maraine 0.5% with EPI 1:200000  [place] printer was not plugged in and would not print image but I did use it.  Femoral nerve block

## 2011-01-11 NOTE — Transfer of Care (Signed)
Immediate Anesthesia Transfer of Care Note  Patient: Nancy [name]  Procedure(s) Performed:  TOTAL KNEE ARTHROPLASTY - COMPUTER ASSISTED TOTAL KNEE REPLACEMENT  Patient Location: PACU  Anesthesia Type: General  Level of Consciousness: awake, alert , oriented and patient cooperative  Airway & Oxygen Therapy: Patient Spontanous Breathing and Patient connected to nasal cannula oxygen  Post-op Assessment: Report given to PACU RN, Post -op Vital signs reviewed and stable and Patient moving all extremities  Post vital signs: Reviewed and stable  Complications: No apparent anesthesia complications

## 2011-01-11 NOTE — Op Note (Signed)
NAMEGEORGI, TUEL NO.:  [id number]  MEDICAL RECORD NO.:  [id number]  LOCATION:  5041                         FACILITY:  [facility]  PHYSICIAN:  Harvie Junior, M.D.   DATE OF BIRTH:  1965-03-08  DATE OF PROCEDURE:  01/11/2011 DATE OF DISCHARGE:                              OPERATIVE REPORT   PREOPERATIVE DIAGNOSES:  End-stage degenerative joint disease of right knee with failure of all conservative care, bone-on-bone arthritis.  POSTOPERATIVE DIAGNOSES:  End-stage degenerative joint disease of right knee with failure of all conservative care, bone-on-bone arthritis.  PROCEDURE: 1. Right total knee replacement with a Sigma system, size 3 femur,     size 4 tibia with an MBT revision tray, a 10-mm bridging bearing,     and a 35-mm all-polyethylene patella. 2. Computer-assisted right total knee replacement.  SURGEON:  Harvie Junior, MD  ASSISTANT:  Marshia Ly, PA  ANESTHESIA:  General.  BRIEF HISTORY:  Ms. Crandell is a 46 year old female with long history of significant right knee pain after work-related injury.  She was treated conservatively initially, underwent arthroscopy and noted to have severe bone-on-bone changes.  After failure of her arthroscopy to completely resolve her pain and injection therapy, use of viscosupplementation, use of a cane, activity modification, x-ray showing severe bone-on-bone arthritis in the lateral compartment, she is ultimately taken to operating room for right total knee replacement.  Given her young age and valgus malalignment, we felt that computer assistance was critical and this was chosen to be used preoperatively.  PROCEDURE IN DETAIL:  The patient was taken to the operating room. After adequate anesthesia was achieved with general anesthetic, the patient was placed supine on the operating table.  The right leg was then prepped and draped in the usual sterile fashion.  Following this, the leg was exsanguinated.   Blood pressure tourniquet was inflated to 350 mmHg.  Following this, a midline incision was made, curved slightly lateral to follow valgus malalignment, subcutaneous tissue down to the level of extensor mechanism.  Medial parapatellar arthrotomy was undertaken, medial and lateral meniscus were removed as well as retropatellar fat pad, anterior and posterior cruciates, and the synovium anterior aspect of the femur.  Following this, computer components were placed, 2 pins in the tibia, 2 pins in the femur, and the registration process undertaken at 30 minutes of surgical procedure. Once this was completed, the tibia was cut perpendicular to its long axis.  The femur was then cut perpendicular to the anatomic axis.  Once this was completed, spacer blocks were put in place showing full extension and perfect neutral long alignment.  Once this was completed, attention was turned towards the lateral side where the IT band was released off the tibia and the capsular structures released off the tibia.  This gave much more neutral balance to the knee.  Once this was completed, attention was turned towards the femur, which had anterior and posterior cuts made as well as Chamfers and then the box cut, and after it had been sized with neutral rotation.  Once this was completed, attention turned to the tibia, which was sized to a 4, was drilled and keeled for MBT revision tray.  We  put 2 degrees of posterior slope and the tibial clot with the computer to accommodate the tibial tray.  Once this was completed, attention was turned towards the trial components were a size 4 tibia was place, size 3 femur, 10 mm bridging bearing, perfect neutral long alignment by computer assistance.  Attention turned to the patella cut down to the level 13 mm and 35 paddle was chosen. The lugs were drilled for this and the lugs drilled for the femur. Trial components removed.  The knee was copiously and thoroughly  lavaged and suctioned dry.  The final components were then cemented into place, size 3 femur, size 4 tibia, 10 mm bridging bearing and a 35-mm all poly- patella with a clamp placed.  Once the cement was allowed to harden, the tourniquet was let down.  We placed the trial poly initially. Tourniquet was let down at this point and all bleeders controlled with electrocautery.  The knee was again copiously and thoroughly lavaged and suctioned dry.  The final polyethylene was then put in place and put through a range of motion, no tendency towards lateral tracking of the patella.  At this point, the medium Hemovac drain was placed.  The medial parapatellar arthrotomy was closed with 1 Vicryl running, skin with 0 and 2-0 Vicryl and 3-0 Monocryl subcuticular.  Benzoin and Steri- Strips were applied.  Sterile compressive dressing was applied.  The patient was taken to recovery room where she was noted to be in satisfactory condition.  Estimated blood loss for the procedure was minimal.     Harvie Junior, M.D.     Ranae Plumber  D:  01/11/2011  T:  01/11/2011  Job:  [id number]

## 2011-01-11 NOTE — Progress Notes (Signed)
Utilization review completed. Kiaan Overholser, RN, BSN. 01/11/11  

## 2011-01-11 NOTE — Brief Op Note (Signed)
01/11/2011  10:11 AM  PATIENT:  Nancy [name]  46 y.o. female  PRE-OPERATIVE DIAGNOSIS:  degenerative joint disease Right knee  POST-OPERATIVE DIAGNOSIS:  degenerative joint disease Right knee  PROCEDURE:  Procedure(s): TOTAL KNEE ARTHROPLASTY  SURGEON:  Surgeon(s): Harvie Junior  PHYSICIAN ASSISTANT:   ASSISTANTS: bethune   ANESTHESIA:   general and w/ fem n. block  EBL:  Total I/O In: 1000 [I.V.:1000] Out: 300 [Urine:300]  BLOOD ADMINISTERED:none  DRAINS: (1) Hemovact drain(s) in the knee joint with  Suction Open   LOCAL MEDICATIONS USED:  NONE  SPECIMEN:  No Specimen  DISPOSITION OF SPECIMEN:  N/A  COUNTS:  YES  TOURNIQUET:   Total Tourniquet Time Documented: Thigh (Right) - 75 minutes  DICTATION: .Other Dictation: Dictation Number [id number]  PLAN OF CARE: Admit to inpatient   PATIENT DISPOSITION:  PACU - hemodynamically stable.   Delay start of Pharmacological VTE agent (>24hrs) due to surgical blood loss or risk of bleeding:  {YES/NO/NOT APPLICABLE:20182

## 2011-01-11 NOTE — Anesthesia Postprocedure Evaluation (Signed)
  Anesthesia Post-op Note  Patient: Nancy [name]  Procedure(s) Performed:  TOTAL KNEE ARTHROPLASTY - COMPUTER ASSISTED TOTAL KNEE REPLACEMENT  Patient Location: PACU  Anesthesia Type: General with regional  Level of Consciousness: awake, alert  and oriented  Airway and Oxygen Therapy: Patient Spontanous Breathing and Patient connected to nasal cannula oxygen  Post-op Pain: mild  Post-op Assessment: Post-op Vital signs reviewed, Patient's Cardiovascular Status Stable, Respiratory Function Stable, Patent Airway, No signs of Nausea or vomiting and Pain level controlled  Post-op Vital Signs: Reviewed and stable  Complications: No apparent anesthesia complications

## 2011-01-12 LAB — CBC
Hemoglobin: 11.8 g/dL — ABNORMAL LOW (ref 12.0–15.0)
MCHC: 33.1 g/dL (ref 30.0–36.0)
RBC: 3.84 MIL/uL — ABNORMAL LOW (ref 3.87–5.11)
WBC: 11.1 10*3/uL — ABNORMAL HIGH (ref 4.0–10.5)

## 2011-01-12 LAB — BASIC METABOLIC PANEL
Chloride: 96 mEq/L (ref 96–112)
GFR calc non Af Amer: >90 mL/min (ref >90)
Glucose, Bld: 176 mg/dL — ABNORMAL HIGH (ref 70–99)
Potassium: 4.4 mEq/L (ref 3.5–5.1)
Sodium: 133 mEq/L — ABNORMAL LOW (ref 135–145)

## 2011-01-12 LAB — PROTIME-INR
INR: 1.07 (ref 0.00–1.49)
Prothrombin Time: 14.1 seconds (ref 11.6–15.2)

## 2011-01-12 LAB — GLUCOSE, CAPILLARY: Glucose-Capillary: 143 mg/dL — ABNORMAL HIGH (ref 70–99)

## 2011-01-12 MED ORDER — HYDROMORPHONE HCL PF 1 MG/ML IJ SOLN: 1.0000 mg | INTRAMUSCULAR | Status: DC | PRN

## 2011-01-12 MED ORDER — WARFARIN SODIUM 7.5 MG PO TABS
7.5000 mg | ORAL_TABLET | Freq: Once | ORAL | Status: AC
  Administered 2011-01-12: 7.5 mg via ORAL
  Filled 2011-01-12: qty 1

## 2011-01-12 MED ORDER — HYDROMORPHONE HCL PF 1 MG/ML IJ SOLN
1.0000 mg | INTRAMUSCULAR | Status: DC | PRN
  Administered 2011-01-12 (×2): 1 mg via INTRAVENOUS
  Filled 2011-01-12 (×2): qty 1

## 2011-01-12 NOTE — Progress Notes (Signed)
OT Cancellation Note  Treatment cancelled today due to:  RN requested OT to return later as pt is extremely fatigued from being up and is experiencing quite a bit of pain.  Pt is not due for pain meds for several hours yet.  Will re-attempt tomorrow.  01/12/2011 Cipriano Mile OTR/L Pager [phone] Office [phone]

## 2011-01-12 NOTE — Progress Notes (Signed)
ANTICOAGULATION CONSULT NOTE - Follow Up Consult  Pharmacy Consult for Coumadin Indication: VTE prophylaxis  Allergies  Allergen Reactions  . Morphine And Related     Reaction:Severe migraine  . Shellfish Allergy Swelling  . Penicillins Rash  . Theophyllines Itching and Rash    Patient Measurements: Weight: 306 lb (138.801 kg)  Vital Signs: Temp: 97.7 F (36.5 C) (01/05 0954) Temp src: Oral (01/05 0954) BP: 117/55 mmHg (01/05 0954) Pulse Rate: 70  (01/05 0954)  Labs:  Basename 01/12/11 0730  HGB 11.8*  HCT 35.7*  PLT 203  APTT --  LABPROT 14.1  INR 1.07  HEPARINUNFRC --  CREATININE 0.67  CKTOTAL --  CKMB --  TROPONINI --   CrCl is unknown because there is no height on file for the current visit.   Medications:  Scheduled:    . acetaminophen  1,000 mg Intravenous Q6H  . carvedilol  3.125 mg Oral BID WC  . clindamycin (CLEOCIN) IV  600 mg Intravenous Q6H  . docusate sodium  100 mg Oral BID  . ferrous sulfate  325 mg Oral BID WC  . glimepiride  2 mg Oral Q0700  . insulin aspart  0-15 Units Subcutaneous TID WC  . ketorolac  15 mg Intravenous Q8H  . lisinopril  5 mg Oral BID  . metFORMIN  1,000 mg Oral Q breakfast  . metFORMIN  1,500 mg Oral Q supper  . patient's guide to using coumadin book   Does not apply Once  . simvastatin  20 mg Oral q1800  . vitamin B-12  1,000 mcg Oral BID  . warfarin  7.5 mg Oral ONCE-1800  . DISCONTD: HYDROmorphone PCA 0.3 mg/mL   Intravenous Q4H    Assessment: 46 y/o female patient s/p TKR receiving coumadin for dvt px. Coumadin started yesterday, INR subtherapeutic, no bleeding reported.  Goal of Therapy:  INR 2-3   Plan:  Repeat coumadin 7.5mg  today and f/u in am.  Verlene Mayer, PharmD, BCPS Pager [phone] 01/12/2011,2:21 PM

## 2011-01-12 NOTE — Plan of Care (Signed)
Problem: Phase II Progression Outcomes Goal: Ambulates Outcome: Progressing Ambulated with nursing and PT this AM.

## 2011-01-12 NOTE — Progress Notes (Signed)
Physical Therapy Treatment Patient Details Name: Nancy [name] MRN: [id number] DOB: 1965-09-05 Today's Date: 01/12/2011  PT Assessment/Plan  PT - Assessment/Plan Comments on Treatment Session: Pt made good progress with mobility from AM session (evaluation).   PT Plan: Discharge plan remains appropriate PT Frequency: 7X/week Follow Up Recommendations: Home health PT Equipment Recommended: None recommended by PT PT Goals  Acute Rehab PT Goals PT Goal Formulation: With patient Time For Goal Achievement: 4 days Pt will go Supine/Side to Sit: with modified independence Pt will go Sit to Stand: with modified independence PT Goal: Sit to Stand - Progress: Progressing toward goal Pt will Ambulate: 51 - 150 feet;with supervision;with rolling walker PT Goal: Ambulate - Progress: Progressing toward goal Pt will Go Up / Down Stairs: 3-5 stairs;with supervision;with rail(s) Pt will Perform Home Exercise Program: with supervision, verbal cues required/provided  PT Treatment Precautions/Restrictions  Precautions Precautions: Knee Precaution Comments: Knee immobilizer at all times except CPM/PT Required Braces or Orthoses: Yes Knee Immobilizer: On except when in CPM Restrictions Weight Bearing Restrictions: Yes RLE Weight Bearing: Weight bearing as tolerated Mobility (including Balance) Bed Mobility Bed Mobility: No Supine to Sit: 4: Min assist;With rails;HOB elevated (Comment degrees) (30 degrees) Supine to Sit Details (indicate cue type and reason): minimal assist with RLE Transfers Transfers: Yes Sit to Stand: 4: Min assist;Other (comment) (Min Guard (A)) Sit to Stand Details (indicate cue type and reason): (A) only during initiation of transfer- to support RLE as putting foot of recliner down & as pt scooting hips closer to edge of chair; Min Guard (A) to achieve standing Stand to Sit: 5: Supervision Stand to Sit Details: Cues for hand placement &  technique Ambulation/Gait Ambulation/Gait: Yes Ambulation/Gait Assistance: Other (comment) (Min Guard (A)) Ambulation/Gait Assistance Details (indicate cue type and reason): Cues for body positioning inside RW, upright posture Ambulation Distance (Feet): 50 Feet Assistive device: Rolling walker Gait Pattern: Step-to pattern;Antalgic Stairs: No Wheelchair Mobility Wheelchair Mobility: No    Exercise  Total Joint Exercises Ankle Circles/Pumps: AROM;10 reps;Seated;Both Quad Sets: AROM;10 reps;Seated  End of Session PT - End of Session Equipment Utilized During Treatment: Gait belt;Right knee immobilizer Activity Tolerance: Patient tolerated treatment well Patient left: in chair;with call bell in reach Nurse Communication: Mobility status for ambulation;Mobility status for transfers General Behavior During Session: [facility] for tasks performed Cognition: [facility] for tasks performed  Lara Mulch 01/12/2011, 12:53 PM [phone]

## 2011-01-12 NOTE — Progress Notes (Signed)
Physical Therapy Evaluation Patient Details Name: Nancy [name] MRN: [id number] DOB: [birth year] Today's Date: 01/12/2011  Problem List:  Patient Active Problem List  Diagnoses  . HYPERTENSION, UNSPECIFIED    Past Medical History:  Past Medical History  Diagnosis Date  . Diabetes mellitus   . Eczema   . Arthritis     r knee  . Anxiety     panic attacks  . Asthma      tx er 08/2010  . Unspecified essential hypertension     FOLLOWED BY  [facility].PREVIOUSLY SEEN BY [place].ECHO 2008   Past Surgical History:  Past Surgical History  Procedure Date  . Cholecystectomy   . Kidney stone removal   . Tonsillectomy   . Cardiac catheterization     2011 followed by dr bensimohn  . Knee arthroscopy w/ meniscal repair     PT Assessment/Plan/Recommendation PT Assessment Clinical Impression Statement: Pt s/p R TKA.  Will benefit from acute PT services to maximize functional mobility to allow for DC home in next few days with assistance from partner.  Pt very motivated.   PT Recommendation/Assessment: Patient will need skilled PT in the acute care venue PT Problem List: Decreased strength;Decreased range of motion;Decreased mobility;Decreased knowledge of use of DME;Obesity;Pain PT Therapy Diagnosis : Difficulty walking PT Plan PT Frequency: 7X/week PT Treatment/Interventions: DME instruction;Gait training;Stair training;Therapeutic exercise;Patient/family education PT Recommendation Follow Up Recommendations: Home health PT Equipment Recommended: None recommended by PT PT Goals  Acute Rehab PT Goals PT Goal Formulation: With patient Time For Goal Achievement: 4 days Pt will go Supine/Side to Sit: with modified independence Pt will go Sit to Stand: with modified independence Pt will Ambulate: 51 - 150 feet;with supervision;with rolling walker Pt will Go Up / Down Stairs: 3-5 stairs;with supervision;with rail(s) Pt will Perform Home Exercise Program: with supervision,  verbal cues required/provided  PT Evaluation Precautions/Restrictions  Precautions Precautions: Other (comment) Precaution Comments: Knee immobilizer at all times except CPM/PT Required Braces or Orthoses: Yes Knee Immobilizer: On except when in CPM Restrictions Weight Bearing Restrictions: Yes RLE Weight Bearing: Weight bearing as tolerated Prior Functioning  Home Living Lives With: Significant other Receives Help From: Other (Comment) (Partner) Type of Home: House Home Layout: One level Home Access: Stairs to enter Entrance Stairs-Rails: Can reach both Entrance Stairs-Number of Steps: 3 Home Adaptive Equipment: Reacher;Bedside commode/3-in-1;Walker - rolling Prior Function Level of Independence: Independent with basic ADLs;Independent with homemaking with ambulation Able to Take Stairs?: Yes Driving: Yes Vocation: Full time employment Vocation Requirements: Ambulation, standing Cognition Cognition Arousal/Alertness: Awake/alert Overall Cognitive Status: Appears within functional limits for tasks assessed Orientation Level: Oriented X4 Sensation/Coordination Coordination Gross Motor Movements are Fluid and Coordinated: Yes Extremity Assessment RUE Assessment RUE Assessment: Within Functional Limits LUE Assessment LUE Assessment: Within Functional Limits RLE Assessment RLE Assessment: Exceptions to [facility] RLE Strength RLE Overall Strength: Due to pain (strength and ROM decreased due to pain/recent surgery) LLE Assessment LLE Assessment: Within Functional Limits Mobility (including Balance) Bed Mobility Bed Mobility: Yes Supine to Sit: 4: Min assist;With rails;HOB elevated (Comment degrees) (30 degrees) Supine to Sit Details (indicate cue type and reason): minimal assist with RLE Transfers Transfers: Yes Sit to Stand: 4: Min assist;With upper extremity assist;From bed Sit to Stand Details (indicate cue type and reason): Verbal cues for technique Stand to Sit: 5:  Supervision;With upper extremity assist;To chair/3-in-1;With armrests Stand to Sit Details: Verbal cues for technique Ambulation/Gait Ambulation/Gait: Yes Ambulation/Gait Assistance: 4: Min assist;Other (comment) (assist for gaurding (safety)) Ambulation Distance (  Feet): 5 Feet (pt had already ambulated to bathroom twice this AM) Assistive device: Rolling walker;Other (Comment) (Bariatric RW) Gait Pattern: Step-to pattern    Exercise  Total Joint Exercises Ankle Circles/Pumps: 5 reps;Supine;AROM Quad Sets: AROM;Right;5 reps;Supine Straight Leg Raises: AAROM;Right;5 reps;Supine End of Session PT - End of Session Equipment Utilized During Treatment: Gait belt;Right knee immobilizer Activity Tolerance: Patient tolerated treatment well Patient left: in chair;with call bell in reach General Behavior During Session: [facility] for tasks performed Cognition: [facility] for tasks performed  Donnella Sham 01/12/2011, 10:04 AM Lavona Mound, PT  [phone] 01/12/2011

## 2011-01-12 NOTE — Progress Notes (Signed)
Subjective: 1 Day Post-Op Procedure(s) (LRB): TOTAL KNEE ARTHROPLASTY (Right) Patient reports pain as 5 on 0-10 scale.   Up with PT. Foley out this AM. Voiding ok. Objective: Vital signs in last 24 hours: Temp:  [97.5 F (36.4 C)-98.3 F (36.8 C)] 97.7 F (36.5 C) (01/05 0954) Pulse Rate:  [61-90] 70  (01/05 0954) Resp:  [16-20] 17  (01/05 0954) BP: (92-148)/(55-73) 117/55 mmHg (01/05 0954) SpO2:  [92 %-99 %] 96 % (01/05 0954) FiO2 (%):  [95 %-98 %] 98 % (01/05 0400)  Intake/Output from previous day: 01/04 0701 - 01/05 0700 In: 3040 [P.O.:240; I.V.:2800] Out: 2150 [Urine:1500; Drains:650] Intake/Output this shift:     Basename 01/12/11 0730  HGB 11.8*    Basename 01/12/11 0730  WBC 11.1*  RBC 3.84*  HCT 35.7*  PLT 203    Basename 01/12/11 0730  NA 133*  K 4.4  CL 96  CO2 27  BUN 16  CREATININE 0.67  GLUCOSE 176*  CALCIUM 8.3*    Basename 01/12/11 0730  LABPT --  INR 1.07  Right knee exam   Neurovascular intact Intact pulses distally Incision: dressing C/D/I  Assessment/Plan: 1 Day Post-Op Procedure(s) (LRB): TOTAL KNEE ARTHROPLASTY (Right) Up with therapy Cont po coumadin D/C PCA later today. Convert IV to saline lock. Home in 1-2 days. Pull drain in AM.  Skyelyn Scruggs G 01/12/2011, 1:25 PM

## 2011-01-13 LAB — CBC
Hemoglobin: 10.4 g/dL — ABNORMAL LOW (ref 12.0–15.0)
MCH: 30 pg (ref 26.0–34.0)
MCV: 92.2 fL (ref 78.0–100.0)
Platelets: 176 10*3/uL (ref 150–400)
RBC: 3.47 MIL/uL — ABNORMAL LOW (ref 3.87–5.11)

## 2011-01-13 MED ORDER — WARFARIN SODIUM 5 MG PO TABS: 5.0000 mg | ORAL_TABLET | Freq: Every day | ORAL | Status: DC

## 2011-01-13 MED ORDER — OXYCODONE-ACETAMINOPHEN 5-325 MG PO TABS: 1.0000 | ORAL_TABLET | Freq: Four times a day (QID) | ORAL | Status: AC | PRN

## 2011-01-13 MED ORDER — METHOCARBAMOL 750 MG PO TABS: 750.0000 mg | ORAL_TABLET | Freq: Three times a day (TID) | ORAL | Status: AC

## 2011-01-13 MED ORDER — WARFARIN SODIUM 7.5 MG PO TABS
7.5000 mg | ORAL_TABLET | Freq: Once | ORAL | Status: DC
  Filled 2011-01-13: qty 1

## 2011-01-13 NOTE — Progress Notes (Signed)
Occupational Therapy Evaluation Patient Details Name: Nancy [name] MRN: [id number] DOB: 09-25-1965 Today's Date: 01/13/2011  Problem List:  Patient Active Problem List  Diagnoses  . HYPERTENSION, UNSPECIFIED    Past Medical History:  Past Medical History  Diagnosis Date  . Diabetes mellitus   . Eczema   . Arthritis     r knee  . Anxiety     panic attacks  . Asthma      tx er 08/2010  . Unspecified essential hypertension     FOLLOWED BY  [facility].PREVIOUSLY SEEN BY [place].ECHO 2008   Past Surgical History:  Past Surgical History  Procedure Date  . Cholecystectomy   . Kidney stone removal   . Tonsillectomy   . Cardiac catheterization     2011 followed by dr bensimohn  . Knee arthroscopy w/ meniscal repair     OT Assessment/Plan/Recommendation OT Assessment Clinical Impression Statement: Pt s/p R TKA and demonstrates with minimal decrease in I with ADLs and functional transfers.  Pt educated on techniques and safety during ADLs and functional transfers.  No further acute OT needs as pt will have necessary level of assistance upon d/c home. No DME needs. OT Recommendation/Assessment: Patient does not need any further OT services OT Recommendation Follow Up Recommendations: 24 hour supervision/assistance Equipment Recommended: None recommended by OT OT Goals    OT Evaluation Precautions/Restrictions  Precautions Precautions: Knee Precaution Comments: Knee immobilizer at all times except CPM/PT Required Braces or Orthoses: Yes Knee Immobilizer: On except when in CPM Restrictions Weight Bearing Restrictions: No RLE Weight Bearing: Weight bearing as tolerated Prior Functioning Home Living Lives With: Significant other Receives Help From: Other (Comment) (partner) Type of Home: House Home Layout: One level Home Access: Stairs to enter Entrance Stairs-Rails: Can reach both Entrance Stairs-Number of Steps: 3 Bathroom Shower/Tub: Tub/shower  unit;Walk-in shower Bathroom Toilet: Standard Bathroom Accessibility: Yes How Accessible: Accessible via walker Home Adaptive Equipment: Shower chair with back;Reacher;Bedside commode/3-in-1;Walker - rolling Prior Function Level of Independence: Independent with basic ADLs;Independent with homemaking with ambulation Able to Take Stairs?: Yes Driving: Yes Vocation: Full time employment Vocation Requirements: Ambulation, standing ADL ADL Grooming: Performed;Wash/dry hands;Teeth care;Brushing hair;Supervision/safety Grooming Details (indicate cue type and reason): Supervision for safety Where Assessed - Grooming: Standing at sink Toilet Transfer: Research scientist (life sciences) Details (indicate cue type and reason): VC for safe hand placement. Pt ambulated from chair to bathroom. Able to use regular height toilet with no difficulty. Toilet Transfer Method: Proofreader: Regular height toilet Equipment Used: Rolling walker ADL Comments: Pt wearing shirt and shorts upon OT arrival. Pt reported that she required min A from partner to don UB/LB clothing. Pt demonstrates good dynamic standing balance during ADL tasks. Vision/Perception    Cognition Cognition Arousal/Alertness: Awake/alert Overall Cognitive Status: Appears within functional limits for tasks assessed Orientation Level: Oriented X4 Sensation/Coordination   Extremity Assessment RUE Assessment RUE Assessment: Within Functional Limits LUE Assessment LUE Assessment: Within Functional Limits Mobility  Bed Mobility Bed Mobility: No (Pt already in chair upon OT arrival.) Transfers Transfers: Yes Sit to Stand: 5: Supervision;With upper extremity assist;With armrests;From chair/3-in-1;From toilet Sit to Stand Details (indicate cue type and reason): Supervision for safety for transfer from lower surfaces.  Pt required increased time from chair.  Able to demonstrate correct hand placement and  technique. Stand to Sit: 5: Supervision;With armrests;With upper extremity assist;To chair/3-in-1;To toilet Stand to Sit Details: Supervision for safety. VC for hand placement for stand to sit to regular height toilet. Exercises  End of Session OT - End of Session Equipment Utilized During Treatment: Right knee immobilizer Activity Tolerance: Patient tolerated treatment well Patient left: in chair;with call bell in reach General Behavior During Session: [facility] for tasks performed Cognition: [facility] for tasks performed   Cipriano Mile 01/13/2011, 8:56 AM  01/13/2011 Cipriano Mile OTR/L Pager [phone] Office [phone]

## 2011-01-13 NOTE — Progress Notes (Signed)
Physical Therapy Treatment Patient Details Name: Nancy [name] MRN: [id number] DOB: 1965/05/11 Today's Date: 01/13/2011  PT Assessment/Plan  PT - Assessment/Plan Comments on Treatment Session: Great progress today with all mobility and knee strength/ROM. Pt has been discharged today as well by PA/MD. PT Plan: Discharge plan remains appropriate PT Frequency: 7X/week Follow Up Recommendations: Home health PT Equipment Recommended: None recommended by PT;None recommended by OT PT Goals  Acute Rehab PT Goals PT Goal: Sit to Stand - Progress: Met PT Goal: Ambulate - Progress: Met PT Goal: Up/Down Stairs - Progress: Met PT Goal: Perform Home Exercise Program - Progress: Met  PT Treatment Precautions/Restrictions  Precautions Precautions: Knee Precaution Comments: Knee immobilizer at all times except in CPM/PT. Pt with active SLR with less than 5 degree ext lag- KI discontinued today Required Braces or Orthoses: Yes Knee Immobilizer: On at all times;On except when in CPM;Other (comment) (discontinuted today with PA approval) Restrictions Weight Bearing Restrictions: Yes RLE Weight Bearing: Weight bearing as tolerated Mobility (including Balance) Transfers Sit to Stand: 6: Modified independent (Device/Increase time);From chair/3-in-1;With upper extremity assist Sit to Stand Details (indicate cue type and reason): no cues or assist needed Stand to Sit: 6: Modified independent (Device/Increase time);To chair/3-in-1;With upper extremity assist Stand to Sit Details: no cues or assist needed Ambulation/Gait Ambulation/Gait Assistance: 5: Supervision Ambulation/Gait Assistance Details (indicate cue type and reason): cues to progress gait from step-to to a reciprocal gait patter. VC x1 for posture. Ambulation Distance (Feet): 180 Feet (137feet x2) Assistive device: Rolling walker Gait Pattern: Step-to pattern;Step-through pattern;Decreased stance time - right;Decreased step length -  left;Antalgic Stairs: Yes Stairs Assistance: 5: Supervision Stair Management Technique: Two rails;Forwards;With walker Number of Stairs: 3  (2 with 2 rails. 1 with RW.)  Posture/Postural Control Posture/Postural Control: No significant limitations Exercise  Total Joint Exercises Ankle Circles/Pumps: AROM;Both;10 reps;Seated Quad Sets: AROM;Right;10 reps;Seated Heel Slides: AROM;Right;10 reps;Seated (seated knee AROM 9-66 degrees) Straight Leg Raises: AROM;Right;10 reps;Seated End of Session PT - End of Session Equipment Utilized During Treatment: Gait belt Activity Tolerance: Patient tolerated treatment well Patient left: in chair;with call bell in reach Nurse Communication: Mobility status for transfers;Mobility status for ambulation;Weight bearing status General Behavior During Session: [facility] for tasks performed Cognition: [facility] for tasks performed  Sallyanne Kuster 01/13/2011, 12:51 PM  .Sallyanne Kuster, PTA Office- [phone]

## 2011-01-13 NOTE — Progress Notes (Signed)
PATIENT ID:      Nancy [name]  MRN:     [id number] DOB/AGE:    1965-07-06 / 46 y.o.    PROGRESS NOTE Subjective: "I'm ready to go home"   Tolerating Diet: yes         Patient reports pain as 3 on 0-10 scale.    Objective: Vital signs in last 24 hours: Temp:  [98.1 F (36.7 C)-98.7 F (37.1 C)] 98.1 F (36.7 C) (01/06 0550) Pulse Rate:  [72-87] 75  (01/06 0550) Resp:  [16-18] 16  (01/06 0550) BP: (125-152)/(64-72) 137/64 mmHg (01/06 0550) SpO2:  [96 %-99 %] 96 % (01/06 0550)    Intake/Output from previous day: I/O last 3 completed shifts: In: 1520 [P.O.:720; I.V.:800] Out: 1270 [Urine:800; Drains:470]   Intake/Output this shift: Total I/O In: 1 [P.O.:1] Out: -    LABORATORY DATA: Lab Results  Component Value Date   WBC 10.6* 01/13/2011   HGB 10.4* 01/13/2011   HCT 32.0* 01/13/2011   MCV 92.2 01/13/2011   PLT 176 01/13/2011   Lab Results  Component Value Date   NA 133* 01/12/2011   K 4.4 01/12/2011   CL 96 01/12/2011   CO2 27 01/12/2011   BUN 16 01/12/2011   CREATININE 0.67 01/12/2011   GLUCOSE 176* 01/12/2011   CALCIUM 8.3* 01/12/2011   Lab Results  Component Value Date   INR 1.34 01/13/2011    Examination:  Wound: Benign, no sign of infection Neurovascular: Intact distally  Assessment:    POD #2 s/p Procedure(s) (LRB): TOTAL KNEE ARTHROPLASTY (Right)  Plan: PT as ordered Weight Bearing as Tolerated (WBAT)  DVT Prophylaxis:  Coumadin Discharge home. Needs home health PT/ [facility] for protimes. Home CPM. F/U Dr Luiz Blare 10-14 days.  Nancy [name] G, PA 01/13/2011, 11:10 AM

## 2011-01-13 NOTE — Progress Notes (Signed)
ANTICOAGULATION CONSULT NOTE - Follow Up Consult  Pharmacy Consult for Coumadin Indication: VTE prophylaxis  Allergies  Allergen Reactions  . Morphine And Related     Reaction:Severe migraine  . Shellfish Allergy Swelling  . Penicillins Rash  . Theophyllines Itching and Rash    Patient Measurements: Weight: 306 lb (138.801 kg) Adjusted Body Weight:   Vital Signs: Temp: 98.1 F (36.7 C) (01/06 0550) BP: 137/64 mmHg (01/06 0550) Pulse Rate: 75  (01/06 0550)  Labs:  Basename 01/13/11 0620 01/12/11 0730  HGB 10.4* 11.8*  HCT 32.0* 35.7*  PLT 176 203  APTT -- --  LABPROT 16.8* 14.1  INR 1.34 1.07  HEPARINUNFRC -- --  CREATININE -- 0.67  CKTOTAL -- --  CKMB -- --  TROPONINI -- --   CrCl is unknown because there is no height on file for the current visit.   Medications:  Scheduled:    . carvedilol  3.125 mg Oral BID WC  . docusate sodium  100 mg Oral BID  . ferrous sulfate  325 mg Oral BID WC  . glimepiride  2 mg Oral Q0700  . insulin aspart  0-15 Units Subcutaneous TID WC  . ketorolac  15 mg Intravenous Q8H  . lisinopril  5 mg Oral BID  . metFORMIN  1,000 mg Oral Q breakfast  . metFORMIN  1,500 mg Oral Q supper  . simvastatin  20 mg Oral q1800  . vitamin B-12  1,000 mcg Oral BID  . warfarin  7.5 mg Oral ONCE-1800    Assessment: 46 y/o female patient s/p TKA receiving coumadin for dvt px. INR subtherapeutic but trend up nicely on 7.5mg . No bleeding reported.  Goal of Therapy:  INR 2-3   Plan:  Repeat coumadin 7.5mg  today and f/u in am.  Verlene Mayer, PharmD, BCPS Pager [phone] 01/13/2011,2:31 PM

## 2011-01-13 NOTE — Discharge Summary (Signed)
Patient ID: [name] MRN: [id number] DOB/AGE: 1965-04-22 46 y.o.  Admit date: 01/11/2011 Discharge date: 01/13/2011  Admission Diagnoses:  Osteoarthritis right knee Discharge Diagnoses:  Same  Past Medical History  Diagnosis Date  . Diabetes mellitus   . Eczema   . Arthritis     r knee  . Anxiety     panic attacks  . Asthma      tx er 08/2010  . Unspecified essential hypertension     FOLLOWED BY  [facility].PREVIOUSLY SEEN BY [place].ECHO 2008    Surgeries: Procedure(s):Right TOTAL KNEE ARTHROPLASTY on 01/11/2011 computer assisted   Consultants:  none  Discharged Condition: Improved  Hospital Course: Nancy [name] is an 46 y.o. female who was admitted 01/11/2011 for operative treatment of osteoarthritis of right knee. Patient has severe unremitting pain that affects sleep, daily activities, and work/hobbies. After pre-op clearance the patient was taken to the operating room on 01/11/2011 and underwent  Procedure(s):Right  TOTAL KNEE ARTHROPLASTY.    Patient was given perioperative antibiotics: Anti-infectives     Start     Dose/Rate Route Frequency Ordered Stop   01/11/11 1500   clindamycin (CLEOCIN) IVPB 600 mg        600 mg 100 mL/hr over 30 Minutes Intravenous Every 6 hours 01/11/11 1345 02/02/11 0250   01/10/11 1445   clindamycin (CLEOCIN) IVPB 600 mg        600 mg 100 mL/hr over 30 Minutes Intravenous 60 min pre-op 01/10/11 1440 01/11/11 0823           Patient was given sequential compression devices, early ambulation, and chemoprophylaxis to prevent DVT.  Patient benefited maximally from hospital stay and there were no complications.    Recent vital signs: Patient Vitals for the past 24 hrs:  BP Temp Temp src Pulse Resp SpO2  01/13/11 0550 137/64 mmHg 98.1 F (36.7 C) - 75  16  96 %  02-02-2011 2318 152/72 mmHg - - - - -  Feb 02, 2011 2140 152/72 mmHg 98.7 F (37.1 C) - 87  18  99 %  2011/02/02 1400 125/70 mmHg 98.3 F (36.8 C) Oral 72  17  96  %     Recent laboratory studies:  Basename 01/13/11 0620 February 02, 2011 0730  WBC 10.6* 11.1*  HGB 10.4* 11.8*  HCT 32.0* 35.7*  PLT 176 203  NA -- 133*  K -- 4.4  CL -- 96  CO2 -- 27  BUN -- 16  CREATININE -- 0.67  GLUCOSE -- 176*  INR 1.34 1.07  CALCIUM -- 8.3*     Discharge Medications:  Current Discharge Medication List    START taking these medications   Details  methocarbamol (ROBAXIN-750) 750 MG tablet Take 1 tablet (750 mg total) by mouth 3 (three) times daily. Qty: 40 tablet, Refills: 0    oxyCODONE-acetaminophen (PERCOCET) 5-325 MG per tablet Take 1-2 tablets by mouth every 6 (six) hours as needed for pain. Qty: 40 tablet, Refills: 0    !! warfarin (COUMADIN) 5 MG tablet Take 1 tablet (5 mg total) by mouth daily. Qty: 30 tablet, Refills: 0    !! warfarin (COUMADIN) 5 MG tablet Take 1 tablet (5 mg total) by mouth daily. Qty: 30 tablet, Refills: 0     !! - Potential duplicate medications found. Please discuss with provider.    CONTINUE these medications which have NOT CHANGED   Details  carvedilol (COREG) 3.125 MG tablet Take 3.125 mg by mouth 2 (two) times daily with a meal.  Clobetasol Prop Crea-Coal Tar (CLOBETA CREAM EX) Apply 1 application topically daily as needed. For eczema. Apply to hands and feet.     diazepam (VALIUM) 10 MG tablet Take 10 mg by mouth every 6 (six) hours as needed. Patient took 1/2 tablet 01/09/10.     glimepiride (AMARYL) 2 MG tablet Take 2 mg by mouth every morning.      lisinopril (PRINIVIL,ZESTRIL) 5 MG tablet Take 5 mg by mouth 2 (two) times daily.      metFORMIN (GLUCOPHAGE) 1000 MG tablet Take 1,000-1,500 mg by mouth 2 (two) times daily. Take 1 tab in the morning & 1.5 tab at night     pravastatin (PRAVACHOL) 40 MG tablet Take 40 mg by mouth at bedtime.      triamcinolone (KENALOG) 0.025 % cream Apply 1 application topically daily as needed. For eczema. Apply to hands and feet.     Wound Dressings (AMERIGEL WOUND DRESSING)  GEL Apply 1 application topically daily as needed. For eczema and wound care. Apply to hands and feet.     clindamycin (CLEOCIN) 150 MG capsule Take 150 mg by mouth 3 (three) times daily.      vitamin B-12 (CYANOCOBALAMIN) 1000 MCG tablet Take 1,000 mcg by mouth 2 (two) times daily.        STOP taking these medications     aspirin EC 81 MG tablet      HYDROcodone-acetaminophen (NORCO) 5-325 MG per tablet      ibuprofen (ADVIL,MOTRIN) 800 MG tablet         Diagnostic Studies: Dg Chest 2 View  01/03/2011  *RADIOLOGY REPORT*  Clinical Data: Preop.  CHEST - 2 VIEW  Comparison: None.  Findings: Trachea is midline.  Heart size normal.  Lungs are clear. No pleural fluid.  IMPRESSION: No acute findings.  Original Report Authenticated By: Reyes Ivan, M.D.    Disposition: home   Follow-up Information    Follow up with GRAVES,JOHN L. Make an appointment in 2 weeks.   Contact information:   [address] [place] [address] [phone]           Signed: Matthew Folks 01/13/2011, 11:27 AM

## 2011-01-14 ENCOUNTER — Encounter (HOSPITAL_COMMUNITY): Payer: Self-pay | Admitting: Orthopedic Surgery

## 2011-05-07 ENCOUNTER — Ambulatory Visit: Payer: Self-pay

## 2011-05-27 ENCOUNTER — Inpatient Hospital Stay: Payer: Self-pay | Admitting: Podiatry

## 2011-05-27 LAB — BASIC METABOLIC PANEL
Anion Gap: 8 (ref 7–16)
BUN: 14 mg/dL (ref 7–18)
Calcium, Total: 9 mg/dL (ref 8.5–10.1)
Chloride: 99 mmol/L (ref 98–107)
Co2: 29 mmol/L (ref 21–32)
Creatinine: 1.14 mg/dL (ref 0.60–1.30)
EGFR (African American): >60
EGFR (Non-African Amer.): 58 — ABNORMAL LOW
Glucose: 291 mg/dL — ABNORMAL HIGH (ref 65–99)
Osmolality: 283 (ref 275–301)
Potassium: 4.2 mmol/L (ref 3.5–5.1)
Sodium: 136 mmol/L (ref 136–145)

## 2011-05-27 LAB — CBC WITH DIFFERENTIAL/PLATELET
Basophil #: 0.2 10*3/uL — ABNORMAL HIGH (ref 0.0–0.1)
Basophil %: 1.3 %
Eosinophil #: 0.5 10*3/uL (ref 0.0–0.7)
Eosinophil %: 2.8 %
HCT: 40.6 % (ref 35.0–47.0)
HGB: 13.5 g/dL (ref 12.0–16.0)
Lymphocyte #: 2 10*3/uL (ref 1.0–3.6)
Lymphocyte %: 11.6 %
MCH: 31 pg (ref 26.0–34.0)
MCHC: 33.3 g/dL (ref 32.0–36.0)
MCV: 93 fL (ref 80–100)
Monocyte #: 0.8 x10 3/mm (ref 0.2–0.9)
Monocyte %: 4.9 %
Neutrophil #: 13.4 10*3/uL — ABNORMAL HIGH (ref 1.4–6.5)
Neutrophil %: 79.4 %
Platelet: 211 10*3/uL (ref 150–440)
RBC: 4.37 10*6/uL (ref 3.80–5.20)
RDW: 12.4 % (ref 11.5–14.5)
WBC: 16.9 10*3/uL — ABNORMAL HIGH (ref 3.6–11.0)

## 2011-05-28 LAB — HEMOGLOBIN A1C: Hemoglobin A1C: 10.9 % — ABNORMAL HIGH (ref 4.2–6.3)

## 2011-05-28 LAB — LIPID PANEL
HDL Cholesterol: 24 mg/dL — ABNORMAL LOW (ref 40–60)
Ldl Cholesterol, Calc: 47 mg/dL (ref 0–100)
Triglycerides: 184 mg/dL (ref 0–200)
VLDL Cholesterol, Calc: 37 mg/dL (ref 5–40)

## 2011-05-28 LAB — TSH: Thyroid Stimulating Horm: 2.58 u[IU]/mL

## 2011-05-28 LAB — PREGNANCY, URINE: Pregnancy Test, Urine: NEGATIVE m[IU]/mL

## 2011-05-29 LAB — CBC WITH DIFFERENTIAL/PLATELET
Basophil #: 0.1 10*3/uL (ref 0.0–0.1)
Basophil %: 1 %
Eosinophil #: 0.6 10*3/uL (ref 0.0–0.7)
Eosinophil %: 6 %
HCT: 36.4 % (ref 35.0–47.0)
HGB: 12 g/dL (ref 12.0–16.0)
Lymphocyte #: 2.7 10*3/uL (ref 1.0–3.6)
Lymphocyte %: 27.9 %
MCH: 31 pg (ref 26.0–34.0)
MCHC: 32.9 g/dL (ref 32.0–36.0)
MCV: 94 fL (ref 80–100)
Monocyte #: 0.7 x10 3/mm (ref 0.2–0.9)
Monocyte %: 6.9 %
Neutrophil #: 5.6 10*3/uL (ref 1.4–6.5)
Neutrophil %: 58.2 %
Platelet: 198 10*3/uL (ref 150–440)
RBC: 3.87 10*6/uL (ref 3.80–5.20)
RDW: 12.6 % (ref 11.5–14.5)
WBC: 9.7 10*3/uL (ref 3.6–11.0)

## 2011-05-30 LAB — PATHOLOGY REPORT

## 2011-06-01 ENCOUNTER — Inpatient Hospital Stay: Payer: Self-pay | Admitting: Internal Medicine

## 2011-06-01 LAB — CBC WITH DIFFERENTIAL/PLATELET
Basophil #: 0.1 10*3/uL (ref 0.0–0.1)
Eosinophil %: 3.2 %
Lymphocyte #: 0.5 10*3/uL — ABNORMAL LOW (ref 1.0–3.6)
MCH: 30.1 pg (ref 26.0–34.0)
MCHC: 32.1 g/dL (ref 32.0–36.0)
Neutrophil #: 9.7 10*3/uL — ABNORMAL HIGH (ref 1.4–6.5)
Neutrophil %: 87.6 %
RDW: 13.1 % (ref 11.5–14.5)

## 2011-06-01 LAB — BASIC METABOLIC PANEL
Anion Gap: 12 (ref 7–16)
Calcium, Total: 8.7 mg/dL (ref 8.5–10.1)
Co2: 24 mmol/L (ref 21–32)
EGFR (Non-African Amer.): >60
Glucose: 299 mg/dL — ABNORMAL HIGH (ref 65–99)
Sodium: 136 mmol/L (ref 136–145)

## 2011-06-01 LAB — WOUND CULTURE

## 2011-06-02 LAB — CBC WITH DIFFERENTIAL/PLATELET
Basophil #: 0.1 10*3/uL (ref 0.0–0.1)
Basophil %: 0.6 %
Eosinophil #: 0.6 10*3/uL (ref 0.0–0.7)
Eosinophil %: 6.3 %
Lymphocyte #: 0.9 10*3/uL — ABNORMAL LOW (ref 1.0–3.6)
Lymphocyte %: 9.7 %
MCH: 30.5 pg (ref 26.0–34.0)
Monocyte #: 0.5 x10 3/mm (ref 0.2–0.9)
Monocyte %: 5 %
Neutrophil %: 78.4 %
RBC: 4.26 10*6/uL (ref 3.80–5.20)
WBC: 9.5 10*3/uL (ref 3.6–11.0)

## 2011-06-02 LAB — BASIC METABOLIC PANEL
BUN: 12 mg/dL (ref 7–18)
Chloride: 99 mmol/L (ref 98–107)
Co2: 27 mmol/L (ref 21–32)
EGFR (Non-African Amer.): >60
Potassium: 4 mmol/L (ref 3.5–5.1)
Sodium: 135 mmol/L — ABNORMAL LOW (ref 136–145)

## 2011-06-03 LAB — BASIC METABOLIC PANEL
Anion Gap: 9 (ref 7–16)
Calcium, Total: 7.7 mg/dL — ABNORMAL LOW (ref 8.5–10.1)
Chloride: 105 mmol/L (ref 98–107)
Co2: 25 mmol/L (ref 21–32)
Creatinine: 0.75 mg/dL (ref 0.60–1.30)
EGFR (African American): >60
EGFR (Non-African Amer.): >60
Osmolality: 286 (ref 275–301)
Potassium: 4 mmol/L (ref 3.5–5.1)

## 2011-06-03 LAB — WOUND CULTURE

## 2011-06-05 LAB — BASIC METABOLIC PANEL
Anion Gap: 8 (ref 7–16)
BUN: 12 mg/dL (ref 7–18)
Co2: 24 mmol/L (ref 21–32)
EGFR (African American): >60
Glucose: 222 mg/dL — ABNORMAL HIGH (ref 65–99)
Osmolality: 280 (ref 275–301)
Potassium: 3.9 mmol/L (ref 3.5–5.1)

## 2011-06-05 LAB — CBC WITH DIFFERENTIAL/PLATELET
Basophil #: 0.1 10*3/uL (ref 0.0–0.1)
Basophil %: 1.4 %
Eosinophil #: 0.6 10*3/uL (ref 0.0–0.7)
Eosinophil %: 10 %
HCT: 34.4 % — ABNORMAL LOW (ref 35.0–47.0)
HGB: 11.1 g/dL — ABNORMAL LOW (ref 12.0–16.0)
Lymphocyte %: 45.5 %
MCH: 30.3 pg (ref 26.0–34.0)
MCHC: 32.2 g/dL (ref 32.0–36.0)
Monocyte #: 0.4 x10 3/mm (ref 0.2–0.9)
Monocyte %: 6.2 %
Platelet: 247 10*3/uL (ref 150–440)
RBC: 3.66 10*6/uL — ABNORMAL LOW (ref 3.80–5.20)
RDW: 12.8 % (ref 11.5–14.5)
WBC: 6 10*3/uL (ref 3.6–11.0)

## 2011-06-05 LAB — PATHOLOGY REPORT

## 2011-06-07 LAB — CULTURE, BLOOD (SINGLE)

## 2012-02-07 LAB — LIPID PANEL
Cholesterol: 169 mg/dL (ref 0–200)
HDL: 34 mg/dL — AB (ref 35–70)
LDL Cholesterol: 97 mg/dL
Triglycerides: 188 mg/dL — AB (ref 40–160)

## 2012-02-07 LAB — BASIC METABOLIC PANEL
BUN: 13 mg/dL (ref 4–21)
Creatinine: 0.7 mg/dL (ref 0.5–1.1)
Glucose: 231 mg/dL
Potassium: 4.5 mmol/L (ref 3.4–5.3)
SODIUM: 136 mmol/L — AB (ref 137–147)

## 2012-02-07 LAB — HEPATIC FUNCTION PANEL
ALT: 16 U/L (ref 7–35)
AST: 14 U/L (ref 13–35)

## 2012-02-07 LAB — CBC AND DIFFERENTIAL
HCT: 44 % (ref 36–46)
HEMOGLOBIN: 14.7 g/dL (ref 12.0–16.0)
Platelets: 283 10*3/uL (ref 150–399)
WBC: 8.9 10*3/mL

## 2012-05-29 ENCOUNTER — Ambulatory Visit: Payer: Self-pay | Admitting: Family Medicine

## 2012-05-29 LAB — HM MAMMOGRAPHY

## 2012-08-21 ENCOUNTER — Emergency Department: Payer: Self-pay | Admitting: Emergency Medicine

## 2012-08-21 LAB — CBC
HGB: 14.3 g/dL (ref 12.0–16.0)
MCV: 90 fL (ref 80–100)
Platelet: 212 10*3/uL (ref 150–440)
WBC: 9.5 10*3/uL (ref 3.6–11.0)

## 2012-08-21 LAB — COMPREHENSIVE METABOLIC PANEL
Albumin: 3.4 g/dL (ref 3.4–5.0)
Alkaline Phosphatase: 73 U/L (ref 50–136)
Anion Gap: 5 — ABNORMAL LOW (ref 7–16)
BUN: 17 mg/dL (ref 7–18)
Bilirubin,Total: 0.2 mg/dL (ref 0.2–1.0)
Calcium, Total: 8.8 mg/dL (ref 8.5–10.1)
Co2: 26 mmol/L (ref 21–32)
EGFR (African American): >60
EGFR (Non-African Amer.): >60
Potassium: 4.1 mmol/L (ref 3.5–5.1)
Total Protein: 7.1 g/dL (ref 6.4–8.2)

## 2012-08-22 ENCOUNTER — Ambulatory Visit: Payer: Self-pay | Admitting: Family Medicine

## 2014-03-31 LAB — HEMOGLOBIN A1C: Hgb A1c MFr Bld: 12.2 % — AB (ref 4.0–6.0)

## 2014-05-01 NOTE — Consult Note (Signed)
PATIENT NAME:  Nancy [name], Nancy [name] MR#:  [id number] DATE OF BIRTH:  11-14-1965  DATE OF CONSULTATION:  06/02/2011  REFERRING PHYSICIAN:   CONSULTING PHYSICIAN:  Rhona RaiderMatthew G. Orlanda Frankum, DPM  REASON FOR CONSULTATION: Infected left second toe status postop on 05/21 after middle phalanx resection by Dr. Ether GriffinsFowler.   HISTORY OF PRESENT ILLNESS: Patient is a 49 year old female had surgery by Dr. Ether GriffinsFowler earlier this week because of a chronic ulceration on her left second toe. She had a hyperextension deformity on that toe with a prominent middle phalanx that continued to cause ulceration on that region. He did surgery here in the hospital keeping the patient overnight, gave her clindamycin for antibiotic coverage both during the surgery and also afterwards but she developed cellulitis and drainage from the area Friday and Saturday, came back to the hospital for re-evaluation and was admitted. She had elevated white count at that time and was febrile.   PAST MEDICAL HISTORY:  1. Chronic obstructive pulmonary disease with asthma. 2. Benign hypertension. 3. Hyperlipidemia. 4. Type 2 diabetes very poorly controlled with hemoglobin A1c over 10. 5. Nephrolithiasis.  6. Status post cholecystectomy. 7. Recent left foot surgery.   CURRENT MEDICATIONS:  1. Zyvox 600 mg p.o. q.12 hours. This was started on her on Friday. 2. Pravachol 40 mg at bedtime.  3. Metformin 1000 mg p.o. b.i.d.  4. Zestril 5 mg p.o. b.i.d.  5. Claritin 10 mg p.o. daily.  6. Amaryl 2 mg p.o. daily.  7. Flonase 2 puffs daily.  8. Diflucan 100 mg daily.  9. Coreg 3.125 mg p.o. b.i.d.  10. Advair 250/50, 1 puff b.i.d.  11. She has a prescription for Percocet 1 to 2 p.o. q.6 hours p.r.n.   ALLERGIES: Penicillin and seafood.   SOCIAL HISTORY: Negative for EtOH. Negative smoking.   FAMILY HISTORY: Diabetes, hypertension, coronary artery disease.   PHYSICAL EXAMINATION: GENERAL: She is an obese Caucasian female. She is alert, well  oriented. She denied any pain at this juncture but there is a lot of concerns about her digit.   VITAL SIGNS: Most recent vital signs include temperature 98.3, pulse 94, respirations 21, blood pressure 138/84, pulse ox 94%.   LOWER EXTREMITY EXAM: Vasculature deep pulses are very strong, pulsatile flow to DP and PT arteries bilaterally. Dermatologically, the patient has an incision margin on the dorsum of the left second toe. This has dehisced particularly at the DIPJ area. There is heavy drainage coming from the region. Mild cellulitis to the toe and none proximal at this juncture. She showed me a picture of what it looked like on Friday evening/Saturday and it was worse yesterday with more redness and it appears to be better with reduced cellulitis at this time frame.   NEUROLOGIC: Her neurologic status is diminished secondary to her diabetes. She also has some contracture of some of her other toes. The second toe in addition to being red and swollen to a certain extent is also hyperextended and it looks like it has some instability associated with it.   LABORATORY, DIAGNOSTIC AND RADIOLOGICAL DATA: Recent labs include glucose 223. White count is back to normal at 9.5 from 11.1 yesterday. BUN and creatinine are within normal limits at this juncture. Blood culture was negative following 8 to 12 hour check.   CLINICAL IMPRESSION: Dehisced secondary infection left second toe status post middle phalangectomy left second toe. Infection is with what appears to be an initial infection with streptococcus but Staphylococcus was more pronounced. This was sensitive to  oxacillin, Cipro, clindamycin, linezolid, tigecycline, trimethoprim and levofloxacin. Currently the patient is on linezolid IV which she seems to be responding to. The strep was a moderate growth and is sensitive to linezolid also.    TREATMENT PLAN: I talked to the patient at length. She goes not want to amputate the toe at this point which would  probably expedite the overall process and I think she would do fine with that. She wants to try to salvage the toe. I will try to schedule her for surgery tomorrow to at least clean this area out, debride it and pack it open for secondary intention healing and see if we can get some of the infected tissues resolved for her. Will keep her n.p.o. after midnight tonight and have her sign a consent form for that. I will have to get things arranged and organized to try and set that up. I want to talk to Dr. Judithann Sheen first, evaluate her and see if she is medically stable to do this. Have some concerns about getting an echocardiogram on her at some point just to make sure that she does not have any strep colonizations. She has also had a knee replacement in her right knee and had some drainage from incision margin on that which is a concern with this particular infection at this juncture. I will try to get this ordered so we can to try to clean this up tomorrow for her.   ____________________________ Rhona Raider. Taliesin Hartlage, DPM mgt:cms D: 06/02/2011 18:05:44 ET T: 06/03/2011 08:43:39 ET JOB#: [id number]  cc: Rhona Raider Tesla Bochicchio, DPM, <Dictator> Epimenio Sarin MD ELECTRONICALLY SIGNED 06/18/2011 9:35

## 2014-05-01 NOTE — Op Note (Signed)
PATIENT NAME:  Nancy [name], Nancy [name] MR#:  [id number] DATE OF BIRTH:  [birth year]  DATE OF PROCEDURE:  06/03/2011  PREOPERATIVE DIAGNOSIS: Infection right second toe with dehiscence of surgical site from last week with significant cellulitis and soft tissue infection.   POSTOPERATIVE DIAGNOSIS: Infection right second toe with dehiscence of surgical site from last week with significant cellulitis and soft tissue infection including extensive digital deformity.   PROCEDURE:  Amputation distal portion of left second toe.   SURGEON: Epimenio SarinMatthew G Peter Keyworth, DPM   ASSISTANT: None.   HISTORY OF PRESENT ILLNESS: The patient has had some structural deformity to the left second toe with secondary diabetic ulceration on the plantar aspect of that toe for quite a while. Dr. Ether GriffinsFowler has been treating it conservatively. He felt like he could take the middle phalanx out and resolve the problem as that was the bone that was prominent plantarly secondary to hyperextension deformity to the DIPJ of the second toe. Unfortunately, she developed infection with a combination of strep and staph and was admitted to the hospital on Saturday, two days ago. She was started on Zyvox at that time; the culture that Dr. Ether GriffinsFowler did Friday was showing evidence of possible staph infection as well as the strep that she had. I saw her yesterday. She has a noted hyperextension deformity. There was a lot of soft tissue damage/infection with heavy drainage coming from the area Saturday and Friday. There was extensive cellulitis of the toes extending on top of the foot that improved with the Zyvox. We discussed prior to her surgery different options with this. I felt like I needed to make a decision intraoperatively as to whether or not it would be best for her to remove the distal tip of the toe and close it up primarily secondary to distal amputation versus cleaning it out and irrigating it and packing it in hopes that it would heal.   ANESTHESIA:  General.   ANESTHESIOLOGIST: Dr. Ardelle LeschesPalmer   ESTIMATED BLOOD LOSS: Less than 10 mL.   OPERATIVE REPORT: The patient was brought to the operating room and placed on the operating room table and placed in the supine position. At this point after general and local anesthesia was achieved, the patient was prepped and draped in the usual sterile manner. Attention was directed to the left second toe. All the sutures were removed from the area and the area was copiously irrigated and cleaned out with mild dissection to the region. The tissues appeared to be fairly stable without involvement of the bone as far as infection. The distal phalanx may have had some bone involvement but the proximal phalanx head looked fairly clean. There was quite a bit of soft tissue damage to the region and loss of tissue structures in the area as well. The most difficult problem I felt like she is going to have was chronic deformity to the toe and instability of the distal portion of the toe possibly leading to further ulcerations at the proximal phalanx head with pressure and irritation. This was due to the soft tissue loss in the region and also previous loss of the middle phalanx. Based on this and the fact that I thought she would heal primarily more quickly, resolve it, and have a better chance of  fewer problems in the future, we elected to go ahead and do a distal amputation as was arranged and consented for with the patient prior to surgery.   At this point, an incision was made across the  dorsal and plantar distal portion of the toe. The distal phalanx and the infected tissue was removed. The ulcer plantarly was excised with a V-shaped incision and the area was copiously irrigated, removing all infected type soft tissue from the region. At this point I also elected to remove the proximal phalanx head. This was accomplished with bone forceps. The area was then copiously irrigated further and the skin margins were inspected and  were cleaned. At this point, the flaps were closed up from plantar to dorsal with 4-0 nylon simple interrupted sutures. The V-incision of the previous ulcer site was closed primarily also with 4-0 nylon simple interrupted sutures. The flap appeared to be intact and viable. She had good circulation to the region. The area was then dressed with sterile compressive dressing consisting of 4 x 4'[name], Kling, and Kerlix. Wet-to-dry dressing was placed on the toe itself. The patient appeared to tolerate the procedure and anesthesia well and left the operating room for the recovery room with vital signs stable and neurovascular status intact. Tourniquet had been elevated for just a brief period, about five minutes at the beginning of the case.    ____________________________ Rhona Raider. Robertine Kipper, DPM mgt:bjt D: 06/03/2011 11:06:29 ET T: 06/03/2011 15:09:03 ET JOB#: [id number] Epimenio Sarin MD ELECTRONICALLY SIGNED 06/18/2011 9:35

## 2014-05-01 NOTE — H&P (Signed)
PATIENT NAME:  Nancy [name], Nancy [name] MR#:  [id number] DATE OF BIRTH:  [birth year]  DATE OF ADMISSION:  05/27/2011  REASON FOR ADMISSION: Left foot diabetic ulcer.   HISTORY OF PRESENT ILLNESS: This is a 49 year old female who has had a left second toe ulcer for some time. She was scheduled for surgery at the end of the week, presented today with noted increasing swelling, redness, and pain to her left foot. She was seen in the outpatient clinic, had noted lymphangitic streaking to her left foot and the patient states she has been somewhat weak over the last couple of days and I felt it was appropriate to admit her at this time, start IV antibiotics, and plan on surgical intervention to get rid of this infected area.   PAST MEDICAL HISTORY:  1. History of cardiomyopathy with ejection fraction of 30% 06/04/2009, nonobstructive coronary artery disease by cardiac catheterization 06/04/2009.  2. Hypertension.  3. Hyperlipidemia.  4. Diabetes.  5. Normal left ventricular function with LVEF of 55% by echocardiogram on 05/14/2010.  MEDICATIONS: 1. Lisinopril 5 mg twice a day. 2. Metformin 1000 mg in the a.m. and 1500 mg at dinner. 3. Carvedilol 3.125 mg twice a day. 4. Pravastatin 40 mg daily. 5. Albuterol inhaler. 6. Insulin. 7. Byetta. 8. Glimepiride 4 mg daily. 9. Aspirin 81 mg daily. 10. Lasix 20 mg daily.   ALLERGIES: Penicillin, Theo-Dur, and general anesthesia causes anxiety.   SOCIAL HISTORY: She quit smoking 1-1/2 years ago. Is not married but has a life partner.   REVIEW OF SYSTEMS: Denies shortness of breath or chest pain. She has had the recent weakness of note. No fever or chills. GENERAL: She is alert and oriented. VASCULAR: She has fully palpable dorsalis pedis and posterior tibial pulses. Capillary fill time is brisk. NEUROLOGIC: She is completely neuropathic to the lower extremity though she has developed pain to this left second toe. DERMATOLOGIC: She has an open plantar  ulceration under the IPJ of the second toe plantarly. There is noted erythema diffusely to the second toe with erythema at the dorsal mid foot with minimal amount of lymphangitic streaking. No foul odor or purulent drainage is noted. There is an area that probes under the second toe down to the joint area. MUSCULOSKELETAL: Diffuse edema to the left forefoot. There are noted hammertoe contractures of second through fifth toes on the left side.   X-rays reviewed do not show any erosive changes of the second toe. The joint is dislocated at the DIPJ. There is no gas in the soft tissues.   Labs show an elevated white blood cell count at 16.9. It also shows elevated serum glucose at 291, BUN 14, creatinine 1.4.   ASSESSMENT: Diabetic left second toe ulceration with acute infection, cellulitis, and lymphangitis.   PLAN: Verlon AuWe're going to plan on surgical intervention tomorrow. Will place her n.p.o. tonight. We are planning on removal of basically the middle phalanx of the second toe and irrigation and debridement of this region. She understands there is a possible risk of amputation of this toe if intraoperatively there is noted destructive changes to the rest of the toe distally. These risks, benefits, alternatives, and complications associated with the surgery were discussed with the patient in full and consent has been verbally given. Will obtain consent in the hospital. Will consult Internal Medicine for medical evaluation and clearance. Will start on IV Zosyn and consult pharmacy for dosing of this. I will plan on surgery tomorrow.  ____________________________ Argentina DonovanJustin A. Ether GriffinsFowler, DPM  jaf:drc D: 05/27/2011 16:47:23 ET T: 05/27/2011 17:06:07 ET JOB#: [id number]  cc: Jill Alexanders A. Ether Griffins, DPM, <Dictator> [name] DPM ELECTRONICALLY SIGNED 05/28/2011 12:07

## 2014-05-01 NOTE — Discharge Summary (Signed)
PATIENT NAME:  Nancy [name], Nancy [name] MR#:  [id number] DATE OF BIRTH:  [birth year]  DATE OF ADMISSION:  06/01/2011 DATE OF DISCHARGE:  06/05/2011  ADMITTING PHYSICIAN: Aram BeechamJeffrey Sparks, MD  DISCHARGING PHYSICIAN: Enid Baasadhika Charmon Thorson, MD  PRIMARY CARE PHYSICIAN: Lorie PhenixNancy Maloney, MD   CONSULTANTS: Recardo EvangelistMatthew Troxler, DPM - Podiatry.   DISCHARGE DIAGNOSES:  1. Left foot second toe osteomyelitis with foot cellulitis status post toe amputation done on 06/03/2011.  2. Non-insulin-dependent type 2 diabetes mellitus.  3. Takotsubo cardiomyopathy.  4. Chronic obstructive pulmonary disease.  5. Anxiety.   DISCHARGE MEDICATIONS:  1. Bactrim double strength 1 tablet p.o. twice a day until 06/17/2011.  2. Xanax 0.5 mg p.o. every 8 hours p.r.n.  3. Fluconazole 100 mg p.o. daily.  4. Metformin 1000 mg in the morning and 1500 mg in the evening.  5. Percocet 5/325 mg 1 to 2 tablets p.o. every six hours p.r.n. for pain.  6. Advair 100/50 one puff twice a day. 7. Flonase 50 mcg two sprays to each nostril daily.  8. Pravachol 40 mg p.o. daily.  9. Coreg 3.125 mg p.o. twice a day.  10. Lisinopril 5 mg p.o. twice a day. 11. Loratadine 10 mg p.o. daily.  12. Glimepiride 2 mg p.o. daily.   DISCHARGE DIET: Low sodium, ADA diet.   DISCHARGE ACTIVITY: As tolerated. Left foot partial weight-bearing shoe on.   FOLLOWUP INSTRUCTIONS: Follow-up with Dr. Orland Jarredroxler in one week and PCP follow-up in 1 to 2 weeks. An appointment is scheduled with Dr. Orland Jarredroxler for 06/10/2011 at 2:30 p.m. Appointment is scheduled with Dr. Lorie PhenixNancy [name] on 06/21/2011 at 10:30 a.m.   LABS AND IMAGING STUDIES: WBC 6.0, hemoglobin 11.1, hematocrit 34.4, platelet count 247.  Sodium 137, potassium 3.9, chloride 105, bicarbonate 24, BUN 12, creatinine 0.7, glucose 222, calcium 7.7.   Pathology report from distal phalanx surgery is showing abscess and necrosis and underlying osteomyelitis which extends to the margin.   Echo Doppler showed normal  systolic function, ejection fraction of greater than 55%, no wall motion abnormalities seen.   Blood cultures have remained negative. Wound culture is showing MSSA sensitive to Bactrim.   BRIEF HOSPITAL COURSE: Nancy [name] is a 49 year old Caucasian female with past medical history significant for chronic obstructive pulmonary disease, diabetes, and hypertension who was following with Dr. Ether GriffinsFowler and Dr. Orland Jarredroxler as an outpatient for diabetic left foot ulcer and had medial phalanx and distal phalanx resection done on 05/28/2011 who comes back to the hospital complaining of redness and fever and also elevated white count.  1. Left foot cellulitis with progressive osteomyelitis - recently had middle and distal phalangectomy done by Dr. Ether GriffinsFowler one week prior to this admission with worsening of symptoms and was found to have further cellulitis and infection so Dr. Orland Jarredroxler saw the patient and has amputated the distal portion of the second left toe. The pathology shows an abscess and also osteomyelitis extending up to the margins. She was on linezolid and Bactrim while in the hospital and will be discharged only on Bactrim. Her wound looks clear and she will follow up with Dr. Orland Jarredroxler as an outpatient. She has a boot for partial weight-bearing on that foot and we will continue that at this point. She has four more days of linezolid pills left over at home so Dr. Orland Jarredroxler advised to continue taking that.  2. Non-insulin-dependent type 2 diabetes mellitus. She is on glimepiride and metformin. Metformin was stopped in the hospital so her sugars were slightly elevated; however, she will  go back on home medications at the time of discharge.  3. History of Takotsubo cardiomyopathy. She follows up with Dr. Gwen Pounds. She was well compensated and labs were stable. Ejection fraction has come back to greater than 55% at this me. She will continue Coreg, lisinopril, and statin and will follow up with cardiology as needed as an  outpatient.  4. She is on fluconazole probably to help with vaginal fungal infection while she is on antibiotics, which she will continue at this time.   Her course has been otherwise uneventful. She was a little anxious before and after the surgery and Xanax seemed to help her, so she is being discharged on the same.   CODE STATUS: FULL CODE.   DISCHARGE CONDITION: Stable.   DISCHARGE DISPOSITION: Home, as per physical therapy recommendations.   TIME SPENT ON DISCHARGE: 45 minutes. ____________________________ Enid Baas, MD rk:slb D: 06/05/2011 13:40:05 ET T: 06/05/2011 14:25:12 ET JOB#: [id number]  cc: Enid Baas, MD, <Dictator> Leo Grosser, MD Rhona Raider Troxler, DPM Enid Baas MD ELECTRONICALLY SIGNED 06/10/2011 10:13

## 2014-05-01 NOTE — H&P (Signed)
PATIENT NAME:  Nancy [name], Nancy [name] MR#:  [id number] DATE OF BIRTH:  1965/06/27  DATE OF ADMISSION:  06/01/2011  REFERRING PHYSICIAN: Governor Rooks, MD    PRIMARY CARE PHYSICIAN: Lorie Phenix, MD   REASON FOR ADMISSION: Left foot infection status post recent surgery.   HISTORY OF PRESENT ILLNESS: The patient is a 49 year old female with a history of COPD/asthma and diabetes who underwent left foot surgery by Dr. Ether Griffins approximately five days ago. She was sent out on clindamycin. She has had recurrent pain, fever, and redness at the surgical site. She was switched to oral Vioxx but presents to the Emergency Room today with worsening symptoms. In the Emergency Room, the patient was febrile with leukocytosis and is now admitted for further evaluation.   PAST MEDICAL HISTORY:  1. COPD/asthma.  2. Benign hypertension.  3. Hyperlipidemia.  4. Type II diabetes.  5. Nephrolithiasis.  6. Status post cholecystectomy.  7. Status post recent left foot surgery.   MEDICATIONS:  1. Zyvox 600 mg p.o. q.12 hours.  2. Pravachol 40 mg p.o. at bedtime.  3. Metformin 1000 mg p.o. b.i.d.  4. Zestril 5 mg p.o. b.i.d.  5. Claritin 10 mg p.o. daily.  6. Amaryl 2 mg p.o. daily.  7. Flonase 2 puffs each nostril daily.  8. Diflucan 100 mg p.o. daily.  9. Coreg 3.125 mg p.o. b.i.d.  10. Advair 250/50 1 puff b.i.d.  11. Percocet 1 to 2 p.o. q.6 hours p.r.n.   ALLERGIES: Penicillin and seafood.   SOCIAL HISTORY: Negative for alcohol or tobacco abuse.   FAMILY HISTORY: Positive for diabetes, hypertension, and coronary artery disease.  REVIEW OF SYSTEMS: CONSTITUTIONAL: The patient has had fever but no change in weight. EYES: No blurred or double vision. No glaucoma. ENT: No tinnitus or hearing loss. No nasal discharge or bleeding. No difficulty swallowing. RESPIRATORY: No cough or wheezing. No hemoptysis. No painful respiration. CARDIOVASCULAR: No chest pain or orthopnea. No palpitations or syncope. GI: Some  nausea but no vomiting or diarrhea. No abdominal pain. No change in bowel habits. GU: No dysuria or hematuria. No incontinence. ENDOCRINE: No polyuria or polydipsia. No heat or cold intolerance. HEMATOLOGIC: The patient denies anemia, easy bruising, or bleeding. LYMPHATIC: No swollen glands. MUSCULOSKELETAL: The patient denies pain in her neck, back, shoulders, knees, or hips. No gout. NEUROLOGIC: No numbness or migraines. Denies stroke or seizures. PSYCH: The patient denies anxiety, insomnia, or depression.   PHYSICAL EXAMINATION:   GENERAL: The patient is acutely ill appearing in moderate distress.   VITAL SIGNS: Vital signs are remarkable for a blood pressure of 164/84 with a heart rate of 100, respiratory rate of 18, and a temperature of 98.9.   HEENT: Normocephalic, atraumatic. Pupils equally round and reactive to light and accommodation. Extraocular movements are intact. Sclerae are nonicteric. Conjunctivae are clear. Oropharynx is clear.   NECK: Supple without JVD or bruits. No adenopathy or thyromegaly is noted.   LUNGS: Clear to auscultation and percussion without wheezes, rales, or rhonchi. No dullness.   CARDIAC: Regular rate and rhythm with normal S1 and S2. No significant rubs or gallops are present. There is a 2/6 systolic murmur noted throughout the precordium.   ABDOMEN: Soft, nontender with normoactive bowel sounds. No organomegaly or masses were appreciated. No hernias or bruits were noted.   EXTREMITIES: Without clubbing or cyanosis. The left foot showed erythema and tenderness to palpation. No purulent drainage.   NEUROLOGIC: Cranial nerves II through XII grossly intact. Deep tendon reflexes were symmetric.  Motor and sensory exams nonfocal.   PSYCH: The patient was alert and oriented to person, place, and time. She was cooperative and used good judgment.   LABORATORY DATA: White count was 11.1 with a hemoglobin of 14.2. Glucose was 299 with a BUN of 13 and a creatinine of  0.85 with a sodium of 136 and a potassium of 4.1.   ASSESSMENT:  1. Progressive cellulitis with possible osteomyelitis.  2. COPD/asthma.  3. Type II diabetes.  4. Benign hypertension.  5. Cardiac murmur.   PLAN:  1. The patient will be admitted to the floor with IV fluids and IV antibiotics.  2. Will send blood cultures in the Emergency Room.  3. Will obtain a bone scan as well as an echocardiogram.  4. Will consult Podiatry.  5. Follow her sugars with Accu-Cheks q.a.c. and at bedtime and add sliding scale insulin as needed.  6. Continue her pulmonary regimen for now.  7. Further treatment and evaluation will depend upon the patient's progress.   TOTAL TIME SPENT ON THIS PATIENT: 45 minutes.   ____________________________ Duane LopeJeffrey D. Judithann SheenSparks, MD jds:drc D: 06/01/2011 21:16:04 ET T: 06/02/2011 08:36:29 ET JOB#: [id number] cc: Duane LopeJeffrey D. Judithann SheenSparks, MD, <Dictator>, Leo GrosserNancy J. Maloney, MD Alinah Sheard Rodena Medin Rosha Cocker MD ELECTRONICALLY SIGNED 06/02/2011 12:49

## 2014-05-01 NOTE — Consult Note (Signed)
PATIENT NAME:  Nancy [name], Nancy [name] MR#:  [id number] DATE OF BIRTH:  04/11/1965  DATE OF CONSULTATION:  05/27/2011  REFERRING PHYSICIAN:  Dr. Ether GriffinsFowler  CONSULTING PHYSICIAN:  Lamar BlinksBruce J. Kenetra Hildenbrand, MD  REASON FOR CONSULTATION: Diabetes mellitus, hypertension, hyperlipidemia with previous cardiomyopathy.   CHIEF COMPLAINT: "I have foot pain."   HISTORY OF PRESENT ILLNESS: This is a 49 year old female with a history of cardiomyopathy and Takotsubo syndrome for which this occurred several years prior. She has had cardiac catheterization showing normal coronary arteries in the past. Since then she has had serial echocardiograms showing improvements of heart function back to normal ejection fraction with no evidence of severe valvular heart disease. The patient has been on appropriate medication management for diabetes mellitus, hypertension and hyperlipidemia currently controlled on these medications with no significant side effects. The patient has had no evidence of new congestive heart failure type symptoms, angina, PND, orthopnea, syncope. The patient has had recent infection of her left foot for which she needs surgical intervention. EKG is not available on the chart at this time.   REVIEW OF SYSTEMS: Remainder of review of systems negative for vision change, ringing in the ears, hearing loss, cough, congestion, heartburn, nausea, vomiting, diarrhea, bloody stools, stomach pain, extremity pain, leg weakness, cramping of the buttocks, known blood clots, headaches, blackouts, dizzy spells, frequent urination, urination at night, muscle weakness, numbness, anxiety, depression, skin lesions, skin rashes.   PAST MEDICAL HISTORY:  1. Takotsubo syndrome.  2. Hypertension.  3. Diabetes mellitus.  4. Hyperlipidemia.   FAMILY HISTORY: No family members with early onset of cardiovascular disease but some onset of hypotension.   SOCIAL HISTORY: Patient currently denies alcohol or tobacco use and/or caffeine use.    CURRENT MEDICATIONS: As listed.   PHYSICAL EXAMINATION:  VITAL SIGNS: Blood pressure 126/68 bilaterally, heart rate 72 upright, reclining, and regular.   GENERAL: She is a well appearing female in no acute distress.   HEENT: No icterus, thyromegaly, ulcers, hemorrhage, or xanthelasma.   CARDIOVASCULAR: Regular rate and rhythm with normal S1, S2 without murmur, gallop, or rub. Point of maximal impulse is normal size and placement. Carotid upstroke normal without bruit. Jugular venous pressure normal.   LUNGS: Lungs have few basilar crackles with normal respirations.   ABDOMEN: Soft, nontender without hepatosplenomegaly or masses. Abdominal aorta is normal size without bruit.   EXTREMITIES: 2+ radial, trace femoral, normal dorsal pedal pulses with no lower extremity edema. There is an ulcer on her left foot.   SKIN: Within normal limits with no evidence of rashes.   MUSCULOSKELETAL: Normal muscle tone without kyphosis.   NEUROLOGIC: She is oriented to time, place, and person with normal mood and affect.   ASSESSMENT: 49 year old female with hypertension, hyperlipidemia, Takotsubo, cardiomyopathy, improved with normal coronary arteries having no evidence of current congestive heart failure or angina at low risk for cardiovascular complication with foot surgery. Patient is cleared for surgery with provisions below.   RECOMMENDATIONS:  1. No further cardiac diagnostics necessary at this time due to no evidence of congestive heart failure or angina with recent echocardiogram improving with normal LV function. 2. No change current medical regimen for hypertension, heart rate control and continue throughout her surgery without change.  3. No restrictions to any surgical intervention and/or no restrictions to rehabilitation.  4. Continue hypertension control with a goal systolic blood pressure below 045130 mm with ACE inhibitor, beta blocker as before.  5. No further change in pravastatin for  LDL control below 100 mg/dL.  6. Further treatment options and rehabilitation as necessary.  ____________________________ Lamar Blinks, MD bjk:cms D: 05/27/2011 18:33:46 ET T: 05/28/2011 09:24:49 ET JOB#: [id number]  cc: Lamar Blinks, MD, <Dictator> Lamar Blinks MD ELECTRONICALLY SIGNED 05/29/2011 16:58

## 2014-05-01 NOTE — Consult Note (Signed)
PATIENT NAME:  Nancy [name], Nancy [name] MR#:  [id number] DATE OF BIRTH:  [birth year]  DATE OF CONSULTATION:  05/27/2011  REFERRING PHYSICIAN:  Dr. Ether GriffinsFowler  CONSULTING PHYSICIAN:  Shaune PollackQing Jewelz Kobus, MD  PRIMARY CARE PHYSICIAN: Nancy PhenixNancy Maloney, MD   REASON FOR CONSULTATION: Preop clearance.   HISTORY OF PRESENT ILLNESS: The patient is a 49 year old Caucasian female with a history of hypertension, diabetes, asthma, nephrolithiasis, broken heart syndrome, and possible CAD who was admitted for left second toe ulcer infection and cellulitis. Dr. Ether GriffinsFowler requested a medical consult for preop clearance. The patient is alert, awake, oriented in no acute disease. According to the patient, she has increasing swelling, redness, and pain to her left foot. She was scheduled for surgery this coming Friday but since the symptoms were getting worse she was admitted for left second toe surgery. She is not taking any antibiotics. She is taking metformin and glimepiride for diabetes but is not checking her blood sugar. In addition, she said she has broken heart syndrome but she does not know whether it was a heart attack or not.   PAST MEDICAL HISTORY:  1. Hypertension. 2. Hyperlipidemia. 3. Diabetes. 4. Possible coronary artery disease. 5. Asthma.  6. Nephrolithiasis.   SOCIAL HISTORY: Quit smoking two years ago. Denies any alcohol drinking or illicit drugs.   FAMILY HISTORY: Hypertension and diabetes.   ALLERGIES: Penicillin and fish.  MEDICATIONS: 1. Advair 250 mcg/50 mcg one inhalation twice daily.  2. Coreg 3.125 mg p.o. b.i.d.  3. Flonase 50 mcg two sprays nasal once daily.  4. Glimepiride 2 mg p.o. daily.  5. Ibuprofen 800 mg p.o. q.8 hours p.r.n.  6. Lisinopril 5 mg p.o. b.i.d.  7. Loratadine 10 mg p.o. once daily.  8. Metformin 1000 mg p.o. in the morning, 1500 mg p.o. at bedtime.  9. Pravastatin 40 mg p.o. at bedtime.  10. Tramadol 50 mg p.o. q.8 hours p.r.n. for pain.   REVIEW OF SYSTEMS: CONSTITUTIONAL:  The patient denies any fever or chills. No headache or dizziness. No weight loss or weakness. EYES: No blurred vision or blurred vision. No cataract. ENT: No postnasal drip or epistaxis. No dysphagia or slurred speech. RESPIRATORY: No cough, sputum, shortness of breath, or hemoptysis. CARDIOVASCULAR: No chest pain, palpitation, orthopnea, or nocturnal dyspnea. No leg edema. GI: No abdominal pain, nausea, vomiting, or diarrhea. No melena. No bloody stools. GU: No dysuria, hematuria, or incontinence. ENDOCRINE: No polyuria or polydipsia. No heat or cold intolerance. HEMATOLOGY: No easy bruising or bleeding. MUSCULOSKELETAL: Left second toe tenderness, leg swelling and erythema. NEUROLOGY: No syncope, loss of consciousness, or seizure. PSYCH: No depression or anxiety.   PHYSICAL EXAMINATION:   VITALS: Temperature 99.2, blood pressure 174/88, pulse 95, oxygen saturation 94% on room air.   GENERAL: The patient is alert, awake, oriented in no acute distress.   HEENT: Pupils round, equal, reactive to light and accommodation. Moist oral mucosa. Clear oropharynx.   NECK: Supple. No JVD or carotid bruit. No lymphadenopathy. No thyromegaly.   CARDIOVASCULAR: S1, S2 regular rate and rhythm. No murmurs or gallops.   PULMONARY: Bilateral air entry. No wheezing or rales.   ABDOMEN: Soft, obese. No distention. No tenderness. No organomegaly. Bowel sounds present.   EXTREMITIES: No edema, clubbing, or cyanosis. No calf tenderness but has erythema, tenderness, and mild swelling on the left lower part of leg. In addition, the left second toe is in dressing. Bilateral strong pedal pulses.   SKIN: No rash or jaundice.   NEUROLOGY: Alert and oriented x3.  No focal deficit. Power 5 out of 5. Sensation intact. Deep tendon reflexes 2+.   LABORATORY DATA: WBC 16.9, hemoglobin 13.5, platelets 211, glucose 291, BUN 14, creatinine 1.14, sodium 136, potassium 4.2, chloride 99, bicarb 29.  Left foot x-ray showed soft  tissue swelling over the second toe distally consistent with cellulitis. Dislocation of the DIP joint.   IMPRESSION:  1. Cellulitis, diabetic ulcer infection on the left second toe.  2. Leukocytosis.  3. Hypertension, uncontrolled.  4. Diabetes, uncontrolled.  5. Coronary artery disease.  6. Asthma, stable.   RECOMMENDATIONS:  1. The patient has a moderate risk for surgery. The patient will need an EKG and a Cardiology consult from Dr. Darrold Junker who is her cardiologist.  2. Recommended continuing Coreg and lisinopril to control blood pressure.  3. For diabetes, we will start sliding scale and check hemoglobin A1c but hold metformin and glimepiride.  4. GI and DVT prophylaxis.   Discussed the patient'[name] situation with the patient and the patient'[name] mother and the sister.   TIME SPENT: About 60 minutes.   ____________________________ Shaune Pollack, MD qc:drc D: 05/27/2011 19:19:27 ET T: 05/28/2011 09:31:34 ET JOB#: [id number] Shaune Pollack MD ELECTRONICALLY SIGNED 05/28/2011 17:15

## 2014-05-01 NOTE — Op Note (Signed)
PATIENT NAME:  Nancy [name], Nancy [name] MR#:  [id number] DATE OF BIRTH:  [birth year]  DATE OF PROCEDURE:  05/28/2011  PREOPERATIVE DIAGNOSIS:  Left second toe ulceration with infection.   POSTOPERATIVE DIAGNOSIS: Left second toe ulceration with infection.  PROCEDURE PERFORMED: Middle phalangectomy, left second toe.   SURGEON: Geanine Vandekamp A. Ether GriffinsFowler, DPM   ANESTHESIA: General.   SPECIMEN: Middle phalanx from ulceration.   COMPLICATIONS: None.   ESTIMATED BLOOD LOSS: Minimal.   HEMOSTASIS: None.   ANESTHESIA: Local with MAC.  OPERATIVE INDICATIONS: This patient was admitted for an obvious infectious process that has progressed for some time now. She has had a long-standing open left second toe ulceration. It was draining a fair amount of purulence, and at this time we recommended her undergoing surgical intervention. All risks, benefits, alternatives, and complications associated with surgery were discussed with the patient, and informed consent was given.   OPERATIVE PROCEDURE: The patient was brought into the Operating Room and placed on the operating table in the supine position. IV sedation was administered by the Anesthesia team. A local block was placed around the second toe. After sterile prep and drape, an incision was made dorsally along the PIPJ to the DIPJ. A full-thickness incision was taken down. The extensor tendon was noted. It was transected. The middle phalanx was noted and was sharply excised in toto. This was sent for pathological examination. The wound culture was also taken. At this time, the wound was then flushed with copious amounts of irrigation. There was no erosive change to the bone. I felt that there was no obvious osteomyelitis, so we performed a salvage procedure. A small dorsal wedge of skin was taken due to the redundancy of skin, and this was closed up primarily. The plantar ulceration was left open and packed. She will be readmitted to the floor. She tolerated the  procedure and anesthesia well with good perfusion to the toe, with all vital signs stable, and neurovascular status intact.  I will see her back on the floor and will continue to monitor.   ____________________________ Argentina DonovanJustin A. Ether GriffinsFowler, DPM jaf:cbb D: 06/11/2011 15:03:40 ET T: 06/11/2011 16:00:27 ET JOB#: [id number]  cc: Jill AlexandersJustin A. Ether GriffinsFowler, DPM, <Dictator> Ivalee Strauser DPM ELECTRONICALLY SIGNED 06/21/2011 10:06

## 2014-05-10 DIAGNOSIS — I2510 Atherosclerotic heart disease of native coronary artery without angina pectoris: Secondary | ICD-10-CM | POA: Insufficient documentation

## 2014-05-10 DIAGNOSIS — G43909 Migraine, unspecified, not intractable, without status migrainosus: Secondary | ICD-10-CM | POA: Insufficient documentation

## 2014-05-10 DIAGNOSIS — F329 Major depressive disorder, single episode, unspecified: Secondary | ICD-10-CM | POA: Insufficient documentation

## 2014-05-10 DIAGNOSIS — G43009 Migraine without aura, not intractable, without status migrainosus: Secondary | ICD-10-CM | POA: Insufficient documentation

## 2014-05-10 DIAGNOSIS — K219 Gastro-esophageal reflux disease without esophagitis: Secondary | ICD-10-CM

## 2014-05-10 DIAGNOSIS — G64 Other disorders of peripheral nervous system: Secondary | ICD-10-CM | POA: Insufficient documentation

## 2014-05-10 DIAGNOSIS — E782 Mixed hyperlipidemia: Secondary | ICD-10-CM | POA: Insufficient documentation

## 2014-05-10 DIAGNOSIS — J45909 Unspecified asthma, uncomplicated: Secondary | ICD-10-CM | POA: Insufficient documentation

## 2014-05-10 DIAGNOSIS — N6009 Solitary cyst of unspecified breast: Secondary | ICD-10-CM | POA: Insufficient documentation

## 2014-05-10 DIAGNOSIS — E1140 Type 2 diabetes mellitus with diabetic neuropathy, unspecified: Secondary | ICD-10-CM | POA: Insufficient documentation

## 2014-05-10 DIAGNOSIS — I25119 Atherosclerotic heart disease of native coronary artery with unspecified angina pectoris: Secondary | ICD-10-CM | POA: Insufficient documentation

## 2014-05-10 DIAGNOSIS — J309 Allergic rhinitis, unspecified: Secondary | ICD-10-CM | POA: Insufficient documentation

## 2014-05-10 DIAGNOSIS — E668 Other obesity: Secondary | ICD-10-CM | POA: Insufficient documentation

## 2014-05-10 DIAGNOSIS — T753XXA Motion sickness, initial encounter: Secondary | ICD-10-CM

## 2014-05-10 DIAGNOSIS — I5181 Takotsubo syndrome: Secondary | ICD-10-CM

## 2014-05-10 DIAGNOSIS — I10 Essential (primary) hypertension: Secondary | ICD-10-CM | POA: Insufficient documentation

## 2014-05-10 DIAGNOSIS — E669 Obesity, unspecified: Secondary | ICD-10-CM | POA: Insufficient documentation

## 2014-05-10 DIAGNOSIS — F32A Depression, unspecified: Secondary | ICD-10-CM | POA: Insufficient documentation

## 2014-05-10 HISTORY — DX: Takotsubo syndrome: I51.81

## 2014-05-10 HISTORY — DX: Atherosclerotic heart disease of native coronary artery without angina pectoris: I25.10

## 2014-05-10 HISTORY — DX: Motion sickness, initial encounter: T75.3XXA

## 2014-05-10 HISTORY — DX: Solitary cyst of unspecified breast: N60.09

## 2014-05-10 HISTORY — DX: Gastro-esophageal reflux disease without esophagitis: K21.9

## 2014-07-08 ENCOUNTER — Ambulatory Visit: Payer: Self-pay | Admitting: Family Medicine

## 2014-11-06 ENCOUNTER — Other Ambulatory Visit: Payer: Self-pay | Admitting: Family Medicine

## 2014-11-06 DIAGNOSIS — I10 Essential (primary) hypertension: Secondary | ICD-10-CM

## 2014-11-06 DIAGNOSIS — E1142 Type 2 diabetes mellitus with diabetic polyneuropathy: Secondary | ICD-10-CM

## 2014-11-07 NOTE — Telephone Encounter (Signed)
Printed, please fax or call in to pharmacy. Thank you.   

## 2014-11-09 ENCOUNTER — Other Ambulatory Visit: Payer: Self-pay

## 2014-11-09 DIAGNOSIS — E782 Mixed hyperlipidemia: Secondary | ICD-10-CM

## 2014-11-09 MED ORDER — PRAVASTATIN SODIUM 40 MG PO TABS: 40.0000 mg | ORAL_TABLET | Freq: Every day | ORAL | Status: DC

## 2014-11-09 NOTE — Telephone Encounter (Signed)
[facility]

## 2015-01-24 ENCOUNTER — Ambulatory Visit: Payer: Self-pay | Admitting: Obstetrics and Gynecology

## 2015-03-01 ENCOUNTER — Telehealth: Payer: Self-pay | Admitting: Family Medicine

## 2015-03-01 NOTE — Telephone Encounter (Signed)
Left message needs OV and may need to think about MD closer to her work as compliance has been a big issue for this patient.  Thanks.

## 2015-03-01 NOTE — Telephone Encounter (Signed)
Pt is calling to check on her FMLA papers.  CB#[phone]/MW

## 2015-03-09 ENCOUNTER — Ambulatory Visit: Payer: Self-pay | Admitting: Obstetrics and Gynecology

## 2015-03-13 ENCOUNTER — Inpatient Hospital Stay
Admission: EM | Admit: 2015-03-13 | Discharge: 2015-03-18 | DRG: 854 | Disposition: A | Discharge status: Home / Self Care | Payer: Commercial Managed Care - HMO | Attending: Internal Medicine | Admitting: Internal Medicine

## 2015-03-13 ENCOUNTER — Ambulatory Visit: Payer: Self-pay | Admitting: Family Medicine

## 2015-03-13 ENCOUNTER — Emergency Department: Payer: Commercial Managed Care - HMO

## 2015-03-13 ENCOUNTER — Telehealth: Payer: Self-pay | Admitting: Obstetrics and Gynecology

## 2015-03-13 ENCOUNTER — Encounter: Payer: Self-pay | Admitting: Emergency Medicine

## 2015-03-13 DIAGNOSIS — Z880 Allergy status to penicillin: Secondary | ICD-10-CM

## 2015-03-13 DIAGNOSIS — N764 Abscess of vulva: Secondary | ICD-10-CM | POA: Diagnosis present

## 2015-03-13 DIAGNOSIS — Z800 Family history of malignant neoplasm of digestive organs: Secondary | ICD-10-CM | POA: Diagnosis not present

## 2015-03-13 DIAGNOSIS — E860 Dehydration: Secondary | ICD-10-CM | POA: Diagnosis present

## 2015-03-13 DIAGNOSIS — L97509 Non-pressure chronic ulcer of other part of unspecified foot with unspecified severity: Secondary | ICD-10-CM

## 2015-03-13 DIAGNOSIS — Z79899 Other long term (current) drug therapy: Secondary | ICD-10-CM | POA: Diagnosis not present

## 2015-03-13 DIAGNOSIS — Z825 Family history of asthma and other chronic lower respiratory diseases: Secondary | ICD-10-CM | POA: Diagnosis not present

## 2015-03-13 DIAGNOSIS — K219 Gastro-esophageal reflux disease without esophagitis: Secondary | ICD-10-CM | POA: Diagnosis present

## 2015-03-13 DIAGNOSIS — I10 Essential (primary) hypertension: Secondary | ICD-10-CM | POA: Diagnosis present

## 2015-03-13 DIAGNOSIS — E871 Hypo-osmolality and hyponatremia: Secondary | ICD-10-CM

## 2015-03-13 DIAGNOSIS — Z7982 Long term (current) use of aspirin: Secondary | ICD-10-CM | POA: Diagnosis not present

## 2015-03-13 DIAGNOSIS — E11628 Type 2 diabetes mellitus with other skin complications: Secondary | ICD-10-CM | POA: Diagnosis present

## 2015-03-13 DIAGNOSIS — Z801 Family history of malignant neoplasm of trachea, bronchus and lung: Secondary | ICD-10-CM

## 2015-03-13 DIAGNOSIS — E1165 Type 2 diabetes mellitus with hyperglycemia: Secondary | ICD-10-CM | POA: Diagnosis present

## 2015-03-13 DIAGNOSIS — A419 Sepsis, unspecified organism: Secondary | ICD-10-CM | POA: Diagnosis present

## 2015-03-13 DIAGNOSIS — Z91013 Allergy to seafood: Secondary | ICD-10-CM | POA: Diagnosis not present

## 2015-03-13 DIAGNOSIS — I2510 Atherosclerotic heart disease of native coronary artery without angina pectoris: Secondary | ICD-10-CM | POA: Diagnosis present

## 2015-03-13 DIAGNOSIS — F17210 Nicotine dependence, cigarettes, uncomplicated: Secondary | ICD-10-CM | POA: Diagnosis present

## 2015-03-13 DIAGNOSIS — E669 Obesity, unspecified: Secondary | ICD-10-CM | POA: Diagnosis present

## 2015-03-13 DIAGNOSIS — A401 Sepsis due to streptococcus, group B: Secondary | ICD-10-CM | POA: Diagnosis present

## 2015-03-13 DIAGNOSIS — M2042 Other hammer toe(s) (acquired), left foot: Secondary | ICD-10-CM | POA: Diagnosis present

## 2015-03-13 DIAGNOSIS — E1151 Type 2 diabetes mellitus with diabetic peripheral angiopathy without gangrene: Secondary | ICD-10-CM | POA: Diagnosis present

## 2015-03-13 DIAGNOSIS — J45909 Unspecified asthma, uncomplicated: Secondary | ICD-10-CM | POA: Diagnosis present

## 2015-03-13 DIAGNOSIS — Z794 Long term (current) use of insulin: Secondary | ICD-10-CM

## 2015-03-13 DIAGNOSIS — L03116 Cellulitis of left lower limb: Secondary | ICD-10-CM | POA: Diagnosis not present

## 2015-03-13 DIAGNOSIS — Z87442 Personal history of urinary calculi: Secondary | ICD-10-CM | POA: Diagnosis not present

## 2015-03-13 DIAGNOSIS — D649 Anemia, unspecified: Secondary | ICD-10-CM | POA: Diagnosis present

## 2015-03-13 DIAGNOSIS — Z806 Family history of leukemia: Secondary | ICD-10-CM | POA: Diagnosis not present

## 2015-03-13 DIAGNOSIS — L089 Local infection of the skin and subcutaneous tissue, unspecified: Secondary | ICD-10-CM | POA: Diagnosis present

## 2015-03-13 DIAGNOSIS — Z8249 Family history of ischemic heart disease and other diseases of the circulatory system: Secondary | ICD-10-CM | POA: Diagnosis not present

## 2015-03-13 DIAGNOSIS — Z882 Allergy status to sulfonamides status: Secondary | ICD-10-CM

## 2015-03-13 DIAGNOSIS — E1142 Type 2 diabetes mellitus with diabetic polyneuropathy: Secondary | ICD-10-CM | POA: Diagnosis present

## 2015-03-13 DIAGNOSIS — L97529 Non-pressure chronic ulcer of other part of left foot with unspecified severity: Secondary | ICD-10-CM | POA: Diagnosis present

## 2015-03-13 DIAGNOSIS — E11621 Type 2 diabetes mellitus with foot ulcer: Secondary | ICD-10-CM | POA: Diagnosis present

## 2015-03-13 DIAGNOSIS — Z6839 Body mass index (BMI) 39.0-39.9, adult: Secondary | ICD-10-CM

## 2015-03-13 DIAGNOSIS — E785 Hyperlipidemia, unspecified: Secondary | ICD-10-CM | POA: Diagnosis present

## 2015-03-13 DIAGNOSIS — E119 Type 2 diabetes mellitus without complications: Secondary | ICD-10-CM

## 2015-03-13 DIAGNOSIS — Z96651 Presence of right artificial knee joint: Secondary | ICD-10-CM | POA: Diagnosis present

## 2015-03-13 DIAGNOSIS — Z720 Tobacco use: Secondary | ICD-10-CM | POA: Diagnosis present

## 2015-03-13 HISTORY — DX: Takotsubo syndrome: I51.81

## 2015-03-13 HISTORY — DX: Sepsis, unspecified organism: A41.9

## 2015-03-13 HISTORY — DX: Heart failure, unspecified: I50.9

## 2015-03-13 HISTORY — DX: Hypo-osmolality and hyponatremia: E87.1

## 2015-03-13 HISTORY — DX: Local infection of the skin and subcutaneous tissue, unspecified: L08.9

## 2015-03-13 HISTORY — DX: Local infection of the skin and subcutaneous tissue, unspecified: E11.628

## 2015-03-13 HISTORY — DX: Atherosclerotic heart disease of native coronary artery without angina pectoris: I25.10

## 2015-03-13 HISTORY — DX: Gastro-esophageal reflux disease without esophagitis: K21.9

## 2015-03-13 LAB — CBC WITH DIFFERENTIAL/PLATELET
Basophils Absolute: 0.2 10*3/uL — ABNORMAL HIGH (ref 0–0.1)
Basophils Relative: 1 %
EOS PCT: 1 %
Eosinophils Absolute: 0.3 10*3/uL (ref 0–0.7)
HEMATOCRIT: 43.3 % (ref 35.0–47.0)
Hemoglobin: 14.4 g/dL (ref 12.0–16.0)
LYMPHS ABS: 1.8 10*3/uL (ref 1.0–3.6)
LYMPHS PCT: 9 %
MCH: 29.6 pg (ref 26.0–34.0)
MCHC: 33.2 g/dL (ref 32.0–36.0)
MCV: 89.2 fL (ref 80.0–100.0)
Monocytes Absolute: 1.2 10*3/uL — ABNORMAL HIGH (ref 0.2–0.9)
Monocytes Relative: 6 %
NEUTROS ABS: 16.7 10*3/uL — AB (ref 1.4–6.5)
Neutrophils Relative %: 83 %
PLATELETS: 245 10*3/uL (ref 150–440)
RBC: 4.86 MIL/uL (ref 3.80–5.20)
RDW: 13 % (ref 11.5–14.5)
WBC: 20.1 10*3/uL — AB (ref 3.6–11.0)

## 2015-03-13 LAB — COMPREHENSIVE METABOLIC PANEL
ALK PHOS: 81 U/L (ref 38–126)
ALT: 15 U/L (ref 14–54)
AST: 13 U/L — AB (ref 15–41)
Albumin: 3.4 g/dL — ABNORMAL LOW (ref 3.5–5.0)
Anion gap: 10 (ref 5–15)
BILIRUBIN TOTAL: 0.8 mg/dL (ref 0.3–1.2)
BUN: 14 mg/dL (ref 6–20)
CALCIUM: 8.8 mg/dL — AB (ref 8.9–10.3)
CHLORIDE: 98 mmol/L — AB (ref 101–111)
CO2: 23 mmol/L (ref 22–32)
CREATININE: 0.87 mg/dL (ref 0.44–1.00)
Glucose, Bld: 237 mg/dL — ABNORMAL HIGH (ref 65–99)
Potassium: 4.4 mmol/L (ref 3.5–5.1)
Sodium: 131 mmol/L — ABNORMAL LOW (ref 135–145)
TOTAL PROTEIN: 7.7 g/dL (ref 6.5–8.1)

## 2015-03-13 LAB — PROTIME-INR
INR: 1.21
PROTHROMBIN TIME: 15.5 s — AB (ref 11.4–15.0)

## 2015-03-13 LAB — LACTIC ACID, PLASMA: LACTIC ACID, VENOUS: 1.8 mmol/L (ref 0.5–2.0)

## 2015-03-13 MED ORDER — VANCOMYCIN HCL IN DEXTROSE 1-5 GM/200ML-% IV SOLN
1000.0000 mg | Freq: Once | INTRAVENOUS | Status: AC
  Administered 2015-03-13: 1000 mg via INTRAVENOUS
  Filled 2015-03-13: qty 200

## 2015-03-13 MED ORDER — SODIUM CHLORIDE 0.9 % IV SOLN
INTRAVENOUS | Status: DC
  Administered 2015-03-13 – 2015-03-15 (×3): via INTRAVENOUS

## 2015-03-13 MED ORDER — METRONIDAZOLE IN NACL 5-0.79 MG/ML-% IV SOLN
500.0000 mg | Freq: Once | INTRAVENOUS | Status: AC
  Administered 2015-03-13: 500 mg via INTRAVENOUS
  Filled 2015-03-13: qty 100

## 2015-03-13 MED ORDER — AZTREONAM 2 G IJ SOLR
2.0000 g | Freq: Once | INTRAMUSCULAR | Status: AC
  Administered 2015-03-13: 2 g via INTRAVENOUS
  Filled 2015-03-13: qty 2

## 2015-03-13 MED ORDER — HYDROMORPHONE HCL 1 MG/ML IJ SOLN
1.0000 mg | Freq: Once | INTRAMUSCULAR | Status: AC
  Administered 2015-03-13: 1 mg via INTRAVENOUS
  Filled 2015-03-13: qty 1

## 2015-03-13 MED ORDER — SODIUM CHLORIDE 0.9 % IV BOLUS (SEPSIS)
1000.0000 mL | INTRAVENOUS | Status: AC
  Administered 2015-03-13 (×3): 1000 mL via INTRAVENOUS

## 2015-03-13 NOTE — ED Notes (Signed)
States started running fevers 2 days ago, nauseated. Thought she had virus. Today noticed L little toe black. L foot and lower leg reddened and more swollen than R. Is not aware if she injured. Had not visualized foot since Saturday morning.

## 2015-03-13 NOTE — ED Notes (Signed)
Charge nurse notified of pt's cc and lab values; will take pt to next available room for further care

## 2015-03-13 NOTE — ED Provider Notes (Signed)
[facility] Emergency Department Provider Note  ____________________________________________  Time seen: Approximately 815 PM  I have reviewed the triage vital signs and the nursing notes.   HISTORY  Chief Complaint Toe Injury    HPI Nancy [name] is a 50 y.o. female with a history of diabetes who is presenting to the emergency department today with 3 days of left small toe pain and swelling and fever over the past 2 days. She says that she sees Dr. Ether Griffins, the podiatrist, who sent her into the emergency department because of her symptoms today. The patient says her pain is mild but says that she does not have a lot of sensation in the feet as her baseline. No obvious injury. Has not been on any antibiotics recently for this foot or toe.   Past Medical History  Diagnosis Date  . Diabetes mellitus   . Eczema   . Arthritis     r knee  . Anxiety     panic attacks  . Asthma      tx er 08/2010  . Unspecified essential hypertension     FOLLOWED BY  [facility].PREVIOUSLY SEEN BY [facility] 2008    Patient Active Problem List   Diagnosis Date Noted  . Allergic rhinitis 05/10/2014  . Airway hyperreactivity 05/10/2014  . Atherosclerosis of coronary artery 05/10/2014  . Breast cyst 05/10/2014  . Clinical depression 05/10/2014  . Diabetic neuropathy (HCC) 05/10/2014  . Acid reflux 05/10/2014  . Combined fat and carbohydrate induced hyperlipemia 05/10/2014  . BP (high blood pressure) 05/10/2014  . Headache, migraine 05/10/2014  . Extreme obesity (HCC) 05/10/2014  . Disorder of peripheral nervous system (HCC) 05/10/2014  . 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 Adverse effect of motion 05/10/2014  . HYPERTENSION, UNSPECIFIED 08/09/2008  . CCF (congestive cardiac failure) (HCC) 06/06/2008    Past Surgical History  Procedure Laterality Date  . Cholecystectomy    . Kidney stone removal    . Tonsillectomy    . Cardiac catheterization       2011 followed by dr bensimohn  . Knee arthroscopy w/ meniscal repair    . Total knee arthroplasty  01/11/2011    Procedure: TOTAL KNEE ARTHROPLASTY;  Surgeon: Harvie Junior;  Location: [facility] OR;  Service: Orthopedics;  Laterality: Right;  COMPUTER ASSISTED TOTAL KNEE REPLACEMENT  . Nose surgery  1991    due to fracture  . Toe amputation      second toe    Current Outpatient Rx  Name  Route  Sig  Dispense  Refill  . albuterol (PROVENTIL HFA;VENTOLIN HFA) 108 (90 BASE) MCG/ACT inhaler   Inhalation   Inhale into the lungs.         . ALPRAZolam (XANAX) 0.5 MG tablet   Oral   Take by mouth.         Marland Kitchen aspirin 81 MG tablet   Oral   Take by mouth.         . carvedilol (COREG) 3.125 MG tablet   Oral   Take 3.125 mg by mouth 2 (two) times daily with a meal.           . diazepam (VALIUM) 10 MG tablet   Oral   Take 10 mg by mouth every 6 (six) hours as needed. Patient took 1/2 tablet 01/09/10.          Marland Kitchen Fluticasone-Salmeterol (ADVAIR) 250-50 MCG/DOSE AEPB   Inhalation   Inhale into the lungs.         Marland Kitchen  glimepiride (AMARYL) 2 MG tablet   Oral   Take 2 mg by mouth every morning.           Marland Kitchen ibuprofen (ADVIL,MOTRIN) 800 MG tablet   Oral   Take by mouth.         . insulin NPH-regular Human (NOVOLIN 70/30) (70-30) 100 UNIT/ML injection   Subcutaneous   Inject into the skin.         Marland Kitchen lisinopril (PRINIVIL,ZESTRIL) 10 MG tablet      TAKE ONE TABLET BY MOUTH TWICE DAILY   60 tablet   0     Needs to schedule office visit before anymore refi ...   . lisinopril (PRINIVIL,ZESTRIL) 5 MG tablet   Oral   Take 5 mg by mouth 2 (two) times daily.           Marland Kitchen loratadine (CLARITIN) 10 MG tablet   Oral   Take by mouth.         . metFORMIN (GLUCOPHAGE) 1000 MG tablet   Oral   Take 1,000-1,500 mg by mouth 2 (two) times daily. Take 1 tab in the morning & 1.5 tab at night          . pravastatin (PRAVACHOL) 40 MG tablet   Oral   Take 1 tablet (40 mg total) by mouth  at bedtime.   30 tablet   5   . promethazine (PHENERGAN) 25 MG tablet   Oral   Take by mouth.         . sitaGLIPtin (JANUVIA) 100 MG tablet   Oral   Take by mouth.         . traMADol (ULTRAM) 50 MG tablet      TAKE ONE TABLET BY MOUTH EVERY 4 HOURS AS NEEDED   30 tablet   0     Needs to schedule office visit before anymore refi ...   . EXPIRED: warfarin (COUMADIN) 5 MG tablet   Oral   Take 1 tablet (5 mg total) by mouth daily.   30 tablet   0   . EXPIRED: warfarin (COUMADIN) 5 MG tablet   Oral   Take 1 tablet (5 mg total) by mouth daily.   30 tablet   0     Unless otherwise directed.     Allergies Morphine and related; Shellfish allergy; Penicillins; Sulfa antibiotics; and Theophyllines  Family History  Problem Relation Age of Onset  . Cancer Other   . Crohn's disease Other   . GER disease Other   . Transient ischemic attack Mother   . Asthma Mother   . Hypertension Mother   . Lung cancer Father   . Hypertension Father   . Asthma Sister   . Anxiety disorder Sister   . Stomach cancer Maternal Grandmother   . Colon cancer Maternal Grandmother   . Leukemia Maternal Grandmother   . Melanoma Maternal Grandfather   . Asthma Sister   . Hypertension Brother     Social History Social History  Substance Use Topics  . Smoking status: Current Every Day Smoker -- 0.50 packs/day for 30 years    Types: Cigarettes  . Smokeless tobacco: None  . Alcohol Use: Yes     Comment: occasional    Review of Systems Constitutional:  fever/chills Eyes: No visual changes. ENT: No sore throat. Cardiovascular: Denies chest pain. Respiratory: Denies shortness of breath. Gastrointestinal: No abdominal pain.  No nausea, no vomiting.  No diarrhea.  No constipation. Genitourinary: Negative for dysuria. Musculoskeletal: Negative  for back pain. Skin: Negative for rash. Neurological: Negative for headaches, focal weakness or numbness.  10-point ROS otherwise  negative.  ____________________________________________   PHYSICAL EXAM:  VITAL SIGNS: ED Triage Vitals  Enc Vitals Group     BP 03/13/15 1657 116/74 mmHg     Pulse Rate 03/13/15 1657 101     Resp 03/13/15 1657 20     Temp 03/13/15 1657 100.4 F (38 C)     Temp Source 03/13/15 1657 Oral     SpO2 03/13/15 1657 94 %     Weight 03/13/15 1657 266 lb (120.657 kg)     Height 03/13/15 1657 5\' 9"  (1.753 m)     Head Cir --      Peak Flow --      Pain Score 03/13/15 1703 9     Pain Loc --      Pain Edu? --      Excl. in GC? --     Constitutional: Alert and oriented. Well appearing and in no acute distress. Eyes: Conjunctivae are normal. PERRL. EOMI. Head: Atraumatic. Nose: No congestion/rhinnorhea. Mouth/Throat: Mucous membranes are moist.   Neck: No stridor.   Cardiovascular: Normal rate, regular rhythm. Grossly normal heart sounds.  Good peripheral circulation. Respiratory: Normal respiratory effort.  No retractions. Lungs CTAB. Gastrointestinal: Soft and nontender. No distention. Musculoskeletal: Left small toe is ecchymotic and swollen. Very mild tenderness to palpation. No skin breakdown in the webspace. Ecchymosis extends only to the base of the toe but there is erythema with edema extending to the entirety of the foot extending up to the mid calf.  Bilateral dorsalis pedis pulses are present and equal. Neurologic:  Normal speech and language. No gross focal neurologic deficits are appreciated.  Skin:  Skin is warm, dry and intact. No rash noted. Psychiatric: Mood and affect are normal. Speech and behavior are normal.  ____________________________________________   LABS (all labs ordered are listed, but only abnormal results are displayed)  Labs Reviewed  CBC WITH DIFFERENTIAL/PLATELET - Abnormal; Notable for the following:    WBC 20.1 (*)    Neutro Abs 16.7 (*)    Monocytes Absolute 1.2 (*)    Basophils Absolute 0.2 (*)    All other components within normal limits   COMPREHENSIVE METABOLIC PANEL - Abnormal; Notable for the following:    Sodium 131 (*)    Chloride 98 (*)    Glucose, Bld 237 (*)    Calcium 8.8 (*)    Albumin 3.4 (*)    AST 13 (*)    All other components within normal limits  CULTURE, BLOOD (ROUTINE X 2)  CULTURE, BLOOD (ROUTINE X 2)  URINE CULTURE  LACTIC ACID, PLASMA  LACTIC ACID, PLASMA  URINALYSIS COMPLETEWITH MICROSCOPIC ([facility] ONLY)  PROTIME-INR   ____________________________________________  EKG   ____________________________________________  RADIOLOGY  IMPRESSION: Soft tissue swelling about the fifth digit. No definite evidence for underlying fracture or osseous destruction.  ____________________________________________   PROCEDURES    ____________________________________________   INITIAL IMPRESSION / ASSESSMENT AND PLAN / ED COURSE  Pertinent labs & imaging results that were available during my care of the patient were reviewed by me and considered in my medical decision making (see chart for details).  Patient made a code sepsis. She is aware of the need for admission to the hospital. Signed out to Dr. Junious Dresseronnie. ____________________________________________   FINAL CLINICAL IMPRESSION(S) / ED DIAGNOSES  Sepsis. Cellulitis.    Myrna Blazeravid Matthew Schaevitz, MD 03/13/15 2035

## 2015-03-13 NOTE — Telephone Encounter (Signed)
Called pt no answer, LM advising pt to seek care in the ER for possible infection as she has had a fever for 3 days, and there are no providers in office today. Advised pt to make a f/u appt with us after being seen, or pt can be seen in office tomorrow if she declines ED.

## 2015-03-13 NOTE — H&P (Signed)
[facility] [place] [facility]   PATIENT NAME: Nancy [name]    MR#:  [id number]  DATE OF BIRTH:  [birth year]  DATE OF ADMISSION:  03/13/2015  PRIMARY CARE PHYSICIAN: Lorie Phenix, MD   REQUESTING/REFERRING PHYSICIAN: dr Loreli Dollar  CHIEF COMPLAINT:   Chief Complaint  Patient presents with  . Toe Injury    HISTORY OF PRESENT ILLNESS: Nancy [name]  is a 50 y.o. female with a known history of diabetes mellitus, peripheral neuropathy, asthma, anxiety, hypertension, CAD, apical ballooning syndrome and GERD. She presents to the ED today due to left 50th infection. He states that she has a history of peripheral neuropathy and prescription had fixed left toe about 2 months ago. She's been following with podiatry and has been doing well until 2 days ago when she suddenly developed fever and generalized weakness. She has also had a significantly decreased appetite and had nausea with no vomiting. This morning, patient noted a purplish brown discoloration of the fifth left toe. There was minimal drainage and no purulent discharge noted. The left foot was swollen and erythematous and very painful. Sugars to pain at 9/10 and described the pain as sharp but she was able to ambulate. She denies any dizziness, lightheadedness, chest pain, shortness of breath, palpitations, diaphoresis or changes in bowel or urine habits. Her right foot is normal although she states that she has no sensation in both feet. She has a history of diabetes which is poorly controlled but she cannot recall her last A1c. She she is a current daily smoker but promises to quit starting from today as planned with her wife.  On arrival in ED today, patient had a temperature 100.4 with a pulse of 101 and a respiratory rate of 21 with the normal blood pressure. CBC was significant for a white count of 20.1 and CMP was only significant for a sodium of 131. INR was 1.4. Lactic acid and urinalysis are  pending. X-ray of the left foot shows no fracture or bone destruction. Blood cultures are pending ED and patient was started on vancomycin, aztreonam, metronidazole and she was also given 1 L of normal saline as well as Dilaudid. She will be admitted due to diabetic foot infection with sepsis as well as uncontrolled diabetes and hyponatremia. She wishes to be full code.  PAST MEDICAL HISTORY:   Past Medical History  Diagnosis Date  . Diabetes mellitus   . Eczema   . Arthritis     r knee  . Anxiety     panic attacks  . Asthma      tx er 08/2010  . Unspecified essential hypertension     FOLLOWED BY  [facility].PREVIOUSLY SEEN BY [facility].ECHO 2008  . Atherosclerosis of coronary artery 05/10/2014  . [facility] (congestive cardiac failure) (HCC) 06/06/2008    30% EF   . Acid reflux 05/10/2014  . Apical ballooning syndrome 05/10/2014    PAST SURGICAL HISTORY: Past Surgical History  Procedure Laterality Date  . Cholecystectomy    . Kidney stone removal    . Tonsillectomy    . Cardiac catheterization      2011 followed by dr bensimohn  . Knee arthroscopy w/ meniscal repair    . Total knee arthroplasty  01/11/2011    Procedure: TOTAL KNEE ARTHROPLASTY;  Surgeon: Harvie Junior;  Location: [facility] OR;  Service: Orthopedics;  Laterality: Right;  COMPUTER ASSISTED TOTAL KNEE REPLACEMENT  . Nose surgery  1991    due to fracture  .  Toe amputation      second toe    SOCIAL HISTORY:  Social History  Substance Use Topics  . Smoking status: Current Every Day Smoker -- 0.50 packs/day for 30 years    Types: Cigarettes  . Smokeless tobacco: Not on file  . Alcohol Use: Yes     Comment: occasional    FAMILY HISTORY:  Family History  Problem Relation Age of Onset  . Cancer Other   . Crohn's disease Other   . GER disease Other   . Transient ischemic attack Mother   . Asthma Mother   . Hypertension Mother   . Lung cancer Father   . Hypertension Father   . Asthma Sister   . Anxiety disorder Sister    . Stomach cancer Maternal Grandmother   . Colon cancer Maternal Grandmother   . Leukemia Maternal Grandmother   . Melanoma Maternal Grandfather   . Asthma Sister   . Hypertension Brother     DRUG ALLERGIES:  Allergies  Allergen Reactions  . Shellfish Allergy Anaphylaxis  . Morphine And Related Other (See Comments)    Reaction:  Severe migraines   . Percocet [Oxycodone-Acetaminophen] Nausea And Vomiting  . Penicillins Rash and Other (See Comments)    Has patient had a PCN reaction causing immediate rash, facial/tongue/throat swelling, SOB or lightheadedness with hypotension: No Has patient had a PCN reaction causing severe rash involving mucus membranes or skin necrosis: No Has patient had a PCN reaction that required hospitalization No Has patient had a PCN reaction occurring within the last 10 years: No If all of the above answers are "NO", then may proceed with Cephalosporin use.  . Sulfa Antibiotics Rash  . Theophyllines Itching and Rash    REVIEW OF SYSTEMS:   CONSTITUTIONAL: No fever, fatigue or weakness.  EYES: No blurred or double vision.  EARS, NOSE, AND THROAT: No tinnitus or ear pain.  RESPIRATORY: No cough, shortness of breath, wheezing or hemoptysis.  CARDIOVASCULAR: No chest pain, orthopnea, edema.  GASTROINTESTINAL: As in history of present illness GENITOURINARY: No dysuria, hematuria.  ENDOCRINE: No polyuria, nocturia,  HEMATOLOGY: No anemia, easy bruising or bleeding SKIN: No rash or lesion. MUSCULOSKELETAL: As in history of present illness   NEUROLOGIC: No tingling, numbness, weakness.  PSYCHIATRY: No anxiety or depression.   MEDICATIONS AT HOME:  Prior to Admission medications   Medication Sig Start Date End Date Taking? Authorizing Provider  albuterol (PROVENTIL HFA;VENTOLIN HFA) 108 (90 BASE) MCG/ACT inhaler Inhale 2 puffs into the lungs every 4 (four) hours as needed for wheezing or shortness of breath.    Yes Historical Provider, MD  aspirin EC 81  MG tablet Take 81 mg by mouth at bedtime.   Yes Historical Provider, MD  aspirin-acetaminophen-caffeine (EXCEDRIN MIGRAINE) [phone] MG tablet Take 2 tablets by mouth every 6 (six) hours as needed for migraine.   Yes Historical Provider, MD  carvedilol (COREG) 3.125 MG tablet Take 3.125 mg by mouth 2 (two) times daily with a meal.     Yes Historical Provider, MD  diazepam (VALIUM) 10 MG tablet Take 5-10 mg by mouth every 6 (six) hours as needed for anxiety.    Yes Historical Provider, MD  ibuprofen (ADVIL,MOTRIN) 800 MG tablet Take 800 mg by mouth every 8 (eight) hours as needed (for migraines).    Yes Historical Provider, MD  insulin NPH-regular Human (NOVOLIN 70/30) (70-30) 100 UNIT/ML injection Inject 40 Units into the skin 2 (two) times daily.    Yes Historical Provider, MD  lisinopril (PRINIVIL,ZESTRIL) 10 MG tablet Take 10 mg by mouth 2 (two) times daily.   Yes Historical Provider, MD  metFORMIN (GLUCOPHAGE) 500 MG tablet Take 1,000-1,500 mg by mouth 2 (two) times daily with a meal. Pt takes two tablets with breakfast and three tablets with dinner.   Yes Historical Provider, MD  pravastatin (PRAVACHOL) 40 MG tablet Take 1 tablet (40 mg total) by mouth at bedtime. 11/09/14  Yes Lorie Phenix, MD  promethazine (PHENERGAN) 25 MG tablet Take 25 mg by mouth every 6 (six) hours as needed for nausea or vomiting.    Yes Historical Provider, MD  traMADol (ULTRAM) 50 MG tablet Take 25-50 mg by mouth every 4 (four) hours as needed (for migraines).   Yes Historical Provider, MD      PHYSICAL EXAMINATION:   VITAL SIGNS: Blood pressure 105/93, pulse 85, temperature 100.4 F (38 C), temperature source Oral, resp. rate 16, height  (1.753 m), weight 120.657 kg (266 lb), SpO2 99 %.  GENERAL:  50 y.o.-year-old patient lying in the bed with no acute distress. Alert and oriented x 3. EYES: Pupils equal, round, reactive to light and accommodation. No scleral icterus. Extraocular muscles intact.  HEENT:  Head atraumatic, normocephalic. Oropharynx and nasopharynx clear.  NECK:  Supple, no jugular venous distention. No thyroid enlargement, no tenderness.  LUNGS: Normal breath sounds bilaterally, no wheezing, rales,rhonchi or crepitation. No use of accessory muscles of respiration.  CARDIOVASCULAR: S1, S2 normal. No murmurs, rubs, or gallops.  ABDOMEN: Soft, nontender, nondistended. Bowel sounds present. No organomegaly or mass.  EXTREMITIES: There is swelling of the dorsum of the left foot as well as erythema of the left foot. The fifth toe of the left foot appears population/brownish with no drainage or open wound. tenderness to palpation. NEUROLOGIC: Cranial nerves II through XII are intact. Muscle strength 5/5 in all extremities. Decreased sensation in feet bilaterally. Gait not checked.   SKIN: No obvious rash, lesion, or ulcer.   LABORATORY PANEL:   CBC  Recent Labs Lab 03/13/15 1709  WBC 20.1*  HGB 14.4  HCT 43.3  PLT 245  MCV 89.2  MCH 29.6  MCHC 33.2  RDW 13.0  LYMPHSABS 1.8  MONOABS 1.2*  EOSABS 0.3  BASOSABS 0.2*   ------------------------------------------------------------------------------------------------------------------  Chemistries   Recent Labs Lab 03/13/15 1709  NA 131*  K 4.4  CL 98*  CO2 23  GLUCOSE 237*  BUN 14  CREATININE 0.87  CALCIUM 8.8*  AST 13*  ALT 15  ALKPHOS 81  BILITOT 0.8   ------------------------------------------------------------------------------------------------------------------ estimated creatinine clearance is 107.5 mL/min (by C-G formula based on Cr of 0.87). ------------------------------------------------------------------------------------------------------------------ No results for input(s): TSH, T4TOTAL, T3FREE, THYROIDAB in the last 72 hours.  Invalid input(s): FREET3   Coagulation profile  Recent Labs Lab 03/13/15 1709  INR 1.21    ------------------------------------------------------------------------------------------------------------------- No results for input(s): DDIMER in the last 72 hours. -------------------------------------------------------------------------------------------------------------------  Cardiac Enzymes No results for input(s): CKMB, TROPONINI, MYOGLOBIN in the last 168 hours.  Invalid input(s): CK ------------------------------------------------------------------------------------------------------------------ Invalid input(s): POCBNP  ---------------------------------------------------------------------------------------------------------------  Urinalysis    Component Value Date/Time   COLORURINE YELLOW 01/03/2011 1138   APPEARANCEUR CLEAR 01/03/2011 1138   LABSPEC 1.030 01/03/2011 1138   PHURINE 5.0 01/03/2011 1138   GLUCOSEU >1000* 01/03/2011 1138   HGBUR NEGATIVE 01/03/2011 1138   BILIRUBINUR NEGATIVE 01/03/2011 1138   KETONESUR NEGATIVE 01/03/2011 1138   PROTEINUR NEGATIVE 01/03/2011 1138   UROBILINOGEN 0.2 01/03/2011 1138   NITRITE NEGATIVE 01/03/2011 1138   LEUKOCYTESUR NEGATIVE 01/03/2011  1138     RADIOLOGY: Dg Toe 5th Left  03/13/2015  CLINICAL DATA:  Patient with discoloration about the fifth toe. EXAM: DG TOE 5TH LEFT COMPARISON:  Foot radiograph 05/27/2011. FINDINGS: Oblique view demonstrates distal amputation of the second digit. There is soft tissue swelling about the fifth digit. No definite evidence for acute displaced fracture or cortical osseous destruction. IMPRESSION: Soft tissue swelling about the fifth digit. No definite evidence for underlying fracture or osseous destruction. Electronically Signed   By: Annia Belt M.D.   On: 03/13/2015 18:35    EKG: Orders placed or performed in visit on 08/21/12  . EKG 12-Lead    ASSESSMENT  Principal Problem:   Diabetic foot infection (HCC) Active Problems:   Essential hypertension   Diabetes mellitus, type 2  (HCC)   Sepsis secondary to diabetic foot infection   Tobacco abuse   Hyponatremia  PLAN   1). Diabetic foot infection affecting fifth left toe with sepsis. - Patient presents with a two-day history of fever, pain in left foot is without swelling or redness. She has a history of diabetes and peripheral neuropathy. - Physical exam reveals diabetic foot infection and patient is currently on vancomycin, aztreonam and metronidazole after blood cultures are pending in the ED. Continue these antibiotics. - Follow up results of blood cultures and adjust antibiotics as necessary. - Wound care and podiatry consult placed. - Tylenol when necessary for fever. - Continue pain control and follow lactic acid levels.  2). Poorly controlled type 2 diabetes mellitus - Patient is on metformin and 45 units twice a day of 70/30 insulin. Hold metformin and continue 70/30 insulin and add sliding scale insulin. - Consult patient on the need to better control her diabetes. Diabetes educator consult. - Consistent carbohydrate diet - Check A1c.  3). Hyponatremia - This is likely secondary to some volume depletion. Continue hydration. - Check sodium in the morning  4). Hypertension - Continue lisinopril and carvedilol. Monitor BP closely.  5). Tobacco abuse - I counseled patient on the need to quit tobacco use for about 4 minutes. She states that she will quit started today as planned with her life.  All the records are reviewed and case discussed with ED provider. Management plans discussed with the patient, family and they are in agreement.  CODE STATUS: Code Status History    This patient does not have a recorded code status. Please follow your organizational policy for patients in this situation.      I have independently reviewed all EKG and chest x-ray data  VTE prophylaxis: Lovenox if no contraindications and patient at low risk for bleeding. SCD's and progressive ambulation if patient has  contraindications to anticoagulants. No DVT prophylaxis if patient presently receiving therapeutic anticoagulation or is at significant risk of bleeding for which the risk of anticoagulation outweigh the potential benefits.  Vaccinations: Pneumonia & flu vaccine per hospital protocol Prevention: Will proceed with conservative measures for the prevention of delirium in patients older than 65. Fall precautions and 1:1 sitter as needed per hospital protocol.  TOTAL TIME TAKING CARE OF THIS PATIENT: 50 minutes.    Robley Fries M.D on 03/13/2015 at 9:43 PM  Between 7am to 6pm - Pager - [phone]  After 6pm go to [email or web address] - password [email or web address] [facility]  [facility]  Office  [phone]  CC: Primary care physician; Lorie Phenix, MD

## 2015-03-13 NOTE — Telephone Encounter (Signed)
Nancy [name] said her boil has come back and she has been running a fever for 3 days now. She wanted to be seen today but i explained no drs were here today. She wants you to call her.

## 2015-03-14 ENCOUNTER — Ambulatory Visit: Payer: Self-pay | Admitting: Obstetrics and Gynecology

## 2015-03-14 DIAGNOSIS — N764 Abscess of vulva: Secondary | ICD-10-CM

## 2015-03-14 LAB — BASIC METABOLIC PANEL
Anion gap: 5 (ref 5–15)
BUN: 20 mg/dL (ref 6–20)
CALCIUM: 7.5 mg/dL — AB (ref 8.9–10.3)
CO2: 24 mmol/L (ref 22–32)
Chloride: 104 mmol/L (ref 101–111)
Creatinine, Ser: 0.89 mg/dL (ref 0.44–1.00)
GFR calc Af Amer: >60 mL/min (ref >60)
GLUCOSE: 281 mg/dL — AB (ref 65–99)
POTASSIUM: 3.9 mmol/L (ref 3.5–5.1)
Sodium: 133 mmol/L — ABNORMAL LOW (ref 135–145)

## 2015-03-14 LAB — GLUCOSE, CAPILLARY
GLUCOSE-CAPILLARY: 111 mg/dL — AB (ref 65–99)
Glucose-Capillary: 254 mg/dL — ABNORMAL HIGH (ref 65–99)
Glucose-Capillary: 193 mg/dL — ABNORMAL HIGH (ref 65–99)
Glucose-Capillary: 165 mg/dL — ABNORMAL HIGH (ref 65–99)

## 2015-03-14 LAB — URINALYSIS COMPLETE WITH MICROSCOPIC (ARMC ONLY)
BILIRUBIN URINE: NEGATIVE
Glucose, UA: >500 mg/dL — AB
LEUKOCYTES UA: NEGATIVE
NITRITE: NEGATIVE
PROTEIN: 100 mg/dL — AB
SPECIFIC GRAVITY, URINE: 1.022 (ref 1.005–1.030)
pH: 5 (ref 5.0–8.0)

## 2015-03-14 LAB — CBC
HCT: 36.4 % (ref 35.0–47.0)
HCT: 35.6 % (ref 35.0–47.0)
Hemoglobin: 11.9 g/dL — ABNORMAL LOW (ref 12.0–16.0)
Hemoglobin: 11.8 g/dL — ABNORMAL LOW (ref 12.0–16.0)
MCH: 29.5 pg (ref 26.0–34.0)
MCH: 30.2 pg (ref 26.0–34.0)
MCHC: 32.7 g/dL (ref 32.0–36.0)
MCHC: 33 g/dL (ref 32.0–36.0)
MCV: 90.1 fL (ref 80.0–100.0)
MCV: 91.4 fL (ref 80.0–100.0)
PLATELETS: 215 10*3/uL (ref 150–440)
PLATELETS: 195 10*3/uL (ref 150–440)
RBC: 4.04 MIL/uL (ref 3.80–5.20)
RBC: 3.9 MIL/uL (ref 3.80–5.20)
RDW: 13.1 % (ref 11.5–14.5)
RDW: 13.3 % (ref 11.5–14.5)
WBC: 18.3 10*3/uL — ABNORMAL HIGH (ref 3.6–11.0)
WBC: 14.6 10*3/uL — ABNORMAL HIGH (ref 3.6–11.0)

## 2015-03-14 LAB — CREATININE, SERUM
CREATININE: 0.78 mg/dL (ref 0.44–1.00)
GFR calc Af Amer: >60 mL/min (ref >60)

## 2015-03-14 LAB — HEMOGLOBIN A1C: Hgb A1c MFr Bld: 11.6 % — ABNORMAL HIGH (ref 4.0–6.0)

## 2015-03-14 MED ORDER — HYDROCODONE-ACETAMINOPHEN 5-325 MG PO TABS
1.0000 | ORAL_TABLET | Freq: Four times a day (QID) | ORAL | Status: DC | PRN
  Administered 2015-03-14 – 2015-03-17 (×5): 1 via ORAL
  Filled 2015-03-14 (×6): qty 1

## 2015-03-14 MED ORDER — INSULIN ASPART PROT & ASPART (70-30 MIX) 100 UNIT/ML ~~LOC~~ SUSP
40.0000 [IU] | Freq: Two times a day (BID) | SUBCUTANEOUS | Status: DC
  Administered 2015-03-14 – 2015-03-18 (×8): 40 [IU] via SUBCUTANEOUS
  Filled 2015-03-14 (×9): qty 40

## 2015-03-14 MED ORDER — DEXTROSE 5 % IV SOLN
2.0000 g | Freq: Three times a day (TID) | INTRAVENOUS | Status: DC
  Administered 2015-03-14: 2 g via INTRAVENOUS
  Filled 2015-03-14 (×3): qty 2

## 2015-03-14 MED ORDER — INSULIN ASPART 100 UNIT/ML ~~LOC~~ SOLN
0.0000 [IU] | Freq: Three times a day (TID) | SUBCUTANEOUS | Status: DC
  Administered 2015-03-14 – 2015-03-17 (×5): 3 [IU] via SUBCUTANEOUS
  Filled 2015-03-14 (×5): qty 3

## 2015-03-14 MED ORDER — ENOXAPARIN SODIUM 40 MG/0.4ML ~~LOC~~ SOLN
40.0000 mg | SUBCUTANEOUS | Status: DC
  Administered 2015-03-14 – 2015-03-17 (×4): 40 mg via SUBCUTANEOUS
  Filled 2015-03-14 (×4): qty 0.4

## 2015-03-14 MED ORDER — ACETAMINOPHEN 325 MG PO TABS
650.0000 mg | ORAL_TABLET | ORAL | Status: DC | PRN
  Administered 2015-03-15: 650 mg via ORAL
  Filled 2015-03-14: qty 2

## 2015-03-14 MED ORDER — TRAMADOL HCL 50 MG PO TABS
25.0000 mg | ORAL_TABLET | ORAL | Status: DC | PRN
  Administered 2015-03-17: 50 mg via ORAL
  Filled 2015-03-14: qty 1

## 2015-03-14 MED ORDER — ALBUTEROL SULFATE (2.5 MG/3ML) 0.083% IN NEBU
3.0000 mL | INHALATION_SOLUTION | RESPIRATORY_TRACT | Status: DC | PRN
Start: 2015-03-14 — End: 2015-03-18
  Administered 2015-03-15: 3 mL via RESPIRATORY_TRACT
  Filled 2015-03-14: qty 3

## 2015-03-14 MED ORDER — LISINOPRIL 10 MG PO TABS
10.0000 mg | ORAL_TABLET | Freq: Two times a day (BID) | ORAL | Status: DC
  Administered 2015-03-14 – 2015-03-18 (×8): 10 mg via ORAL
  Filled 2015-03-14 (×8): qty 1

## 2015-03-14 MED ORDER — SODIUM CHLORIDE 0.9 % IV SOLN
1.0000 g | Freq: Three times a day (TID) | INTRAVENOUS | Status: DC
  Administered 2015-03-14 – 2015-03-17 (×8): 1 g via INTRAVENOUS
  Filled 2015-03-14 (×11): qty 1

## 2015-03-14 MED ORDER — SODIUM CHLORIDE 0.9 % IV SOLN
1750.0000 mg | Freq: Two times a day (BID) | INTRAVENOUS | Status: DC
  Administered 2015-03-14: 1750 mg via INTRAVENOUS
  Filled 2015-03-14 (×2): qty 1750

## 2015-03-14 MED ORDER — VANCOMYCIN HCL 10 G IV SOLR
1500.0000 mg | Freq: Two times a day (BID) | INTRAVENOUS | Status: DC
  Administered 2015-03-14 – 2015-03-15 (×3): 1500 mg via INTRAVENOUS
  Filled 2015-03-14 (×4): qty 1500

## 2015-03-14 MED ORDER — DIAZEPAM 5 MG PO TABS
5.0000 mg | ORAL_TABLET | Freq: Four times a day (QID) | ORAL | Status: DC | PRN
  Administered 2015-03-14 – 2015-03-16 (×3): 5 mg via ORAL
  Filled 2015-03-14 (×3): qty 1

## 2015-03-14 MED ORDER — HYDROMORPHONE HCL 1 MG/ML IJ SOLN
1.0000 mg | INTRAMUSCULAR | Status: DC | PRN
  Administered 2015-03-14 – 2015-03-17 (×12): 1 mg via INTRAVENOUS
  Filled 2015-03-14 (×12): qty 1

## 2015-03-14 MED ORDER — CARVEDILOL 3.125 MG PO TABS
3.1250 mg | ORAL_TABLET | Freq: Two times a day (BID) | ORAL | Status: DC
  Administered 2015-03-14 – 2015-03-18 (×9): 3.125 mg via ORAL
  Filled 2015-03-14 (×9): qty 1

## 2015-03-14 MED ORDER — INSULIN ASPART 100 UNIT/ML ~~LOC~~ SOLN
4.0000 [IU] | Freq: Three times a day (TID) | SUBCUTANEOUS | Status: DC
  Administered 2015-03-14 – 2015-03-18 (×7): 4 [IU] via SUBCUTANEOUS
  Filled 2015-03-14 (×8): qty 4

## 2015-03-14 MED ORDER — METRONIDAZOLE IN NACL 5-0.79 MG/ML-% IV SOLN
500.0000 mg | Freq: Three times a day (TID) | INTRAVENOUS | Status: DC
  Filled 2015-03-14 (×3): qty 100

## 2015-03-14 MED ORDER — PRAVASTATIN SODIUM 20 MG PO TABS
40.0000 mg | ORAL_TABLET | Freq: Every day | ORAL | Status: DC
  Administered 2015-03-14 – 2015-03-17 (×4): 40 mg via ORAL
  Filled 2015-03-14 (×4): qty 2

## 2015-03-14 MED ORDER — ASPIRIN EC 81 MG PO TBEC
81.0000 mg | DELAYED_RELEASE_TABLET | Freq: Every day | ORAL | Status: DC
  Administered 2015-03-14 – 2015-03-17 (×4): 81 mg via ORAL
  Filled 2015-03-14 (×4): qty 1

## 2015-03-14 MED ORDER — PROMETHAZINE HCL 25 MG PO TABS: 25.0000 mg | ORAL_TABLET | Freq: Four times a day (QID) | ORAL | Status: DC | PRN

## 2015-03-14 NOTE — Consult Note (Signed)
GYNECOLOGY INPATIENT CONSULT NOTE  Reason for Consult: Labial abscess Referring Physician: Cory Munch, MD  Nancy [name] is a 50 y.o. P0 female with a PMH of DM, HTN, and CAD who was admitted on yesterday due to complaints of redness changing to discoloration of left little toe, concerning for infection with necrosis, and fevers/chills (notes that she initially thought she had the flu). Of note, patient also mentioned complaints of a right labial abscess that has been intermittently occuring over the past 6-8 months.  States that she will have an abscess/boil appear in the same spot, it will usually rupture and heal after several days, and then will reoccur 1-2 months later.  Current abscess has been present for ~ 5 days.   Pertinent Gynecological History: Menses: irregular occurring approximately every 60-120 days without intermenstrual spotting Bleeding: dysfunctional uterine bleeding Contraception: none Previous GYN Procedures: None  Last mammogram: normal Date: patient cannot recall date Last pap: normal Date: 02/2012 OB History: G0, P0    Menstrual History: Menarche age: 78  Patient's last menstrual period was 01/25/2015.   Past Medical History  Diagnosis Date  . Diabetes mellitus   . Eczema   . Arthritis     r knee  . Anxiety     panic attacks  . Asthma      tx er 08/2010  . Unspecified essential hypertension     FOLLOWED BY  [facility].PREVIOUSLY SEEN BY [place].ECHO 2008  . Atherosclerosis of coronary artery 05/10/2014  . [facility] (congestive cardiac failure) ([place]) 06/06/2008    30% EF   . Acid reflux 05/10/2014  . Apical ballooning syndrome 05/10/2014    Past Surgical History  Procedure Laterality Date  . Cholecystectomy    . Kidney stone removal    . Tonsillectomy    . Cardiac catheterization      2011 followed by dr [email or web address]  . Knee arthroscopy w/ meniscal repair    . Total knee arthroplasty  01/11/2011    Procedure: TOTAL KNEE ARTHROPLASTY;  Surgeon: Alta Corning;  Location: [place];  Service: Orthopedics;  Laterality: Right;  COMPUTER ASSISTED TOTAL KNEE REPLACEMENT  . Nose surgery  1991    due to fracture  . Toe amputation      second toe    Family History  Problem Relation Age of Onset  . Cancer Other   . Crohn's disease Other   . GER disease Other   . Transient ischemic attack Mother   . Asthma Mother   . Hypertension Mother   . Lung cancer Father   . Hypertension Father   . Asthma Sister   . Anxiety disorder Sister   . Stomach cancer Maternal Grandmother   . Colon cancer Maternal Grandmother   . Leukemia Maternal Grandmother   . Melanoma Maternal Grandfather   . Asthma Sister   . Hypertension Brother     Social History:  reports that she has been smoking Cigarettes.  She has a 15 pack-year smoking history. She does not have any smokeless tobacco history on file. She reports that she drinks alcohol. She reports that she does not use illicit drugs.  Allergies:  Allergies  Allergen Reactions  . Shellfish Allergy Anaphylaxis  . Morphine And Related Other (See Comments)    Reaction:  Severe migraines   . Percocet [Oxycodone-Acetaminophen] Nausea And Vomiting  . Penicillins Rash and Other (See Comments)    Has patient had a PCN reaction causing immediate rash, facial/tongue/throat swelling, SOB or lightheadedness with hypotension:  No Has patient had a PCN reaction causing severe rash involving mucus membranes or skin necrosis: No Has patient had a PCN reaction that required hospitalization No Has patient had a PCN reaction occurring within the last 10 years: No If all of the above answers are "NO", then may proceed with Cephalosporin use.  . Sulfa Antibiotics Rash  . Theophyllines Itching and Rash    Medications:  I have reviewed the patient's current medications. Prior to Admission:  Prescriptions prior to admission  Medication Sig Dispense Refill Last Dose  . albuterol (PROVENTIL HFA;VENTOLIN HFA) 108 (90 BASE)  MCG/ACT inhaler Inhale 2 puffs into the lungs every 4 (four) hours as needed for wheezing or shortness of breath.    Past Month at Unknown time  . aspirin EC 81 MG tablet Take 81 mg by mouth at bedtime.   03/12/2015 at 2200  . aspirin-acetaminophen-caffeine (EXCEDRIN MIGRAINE) 250-250-65 MG tablet Take 2 tablets by mouth every 6 (six) hours as needed for migraine.   Past Week at Unknown time  . carvedilol (COREG) 3.125 MG tablet Take 3.125 mg by mouth 2 (two) times daily with a meal.     03/13/2015 at 1000  . diazepam (VALIUM) 10 MG tablet Take 5-10 mg by mouth every 6 (six) hours as needed for anxiety.    Past Month at Unknown time  . ibuprofen (ADVIL,MOTRIN) 800 MG tablet Take 800 mg by mouth every 8 (eight) hours as needed (for migraines).    Past Month at Unknown time  . insulin NPH-regular Human (NOVOLIN 70/30) (70-30) 100 UNIT/ML injection Inject 40 Units into the skin 2 (two) times daily.    03/13/2015 at Unknown time  . lisinopril (PRINIVIL,ZESTRIL) 10 MG tablet Take 10 mg by mouth 2 (two) times daily.   03/13/2015 at Unknown time  . metFORMIN (GLUCOPHAGE) 500 MG tablet Take 1,000-1,500 mg by mouth 2 (two) times daily with a meal. Pt takes two tablets with breakfast and three tablets with dinner.   03/13/2015 at Unknown time  . pravastatin (PRAVACHOL) 40 MG tablet Take 1 tablet (40 mg total) by mouth at bedtime. 30 tablet 5 03/12/2015 at Unknown time  . promethazine (PHENERGAN) 25 MG tablet Take 25 mg by mouth every 6 (six) hours as needed for nausea or vomiting.    Past Month at Unknown time  . traMADol (ULTRAM) 50 MG tablet Take 25-50 mg by mouth every 4 (four) hours as needed (for migraines).   03/13/2015 at 0200   Scheduled: . aspirin EC  81 mg Oral QHS  . carvedilol  3.125 mg Oral BID WC  . enoxaparin (LOVENOX) injection  40 mg Subcutaneous Q24H  . insulin aspart  0-15 Units Subcutaneous TID WC  . insulin aspart  4 Units Subcutaneous TID WC  . insulin aspart protamine- aspart  40 Units Subcutaneous  BID WC  . lisinopril  10 mg Oral BID  . meropenem (MERREM) IV  1 g Intravenous 3 times per day  . pravastatin  40 mg Oral QHS  . vancomycin  1,500 mg Intravenous Q12H   Continuous: . sodium chloride 75 mL/hr at 03/14/15 1108   Anti-infectives    Start     Dose/Rate Route Frequency Ordered Stop   03/14/15 1800  vancomycin (VANCOCIN) 1,500 mg in sodium chloride 0.9 % 500 mL IVPB     1,500 mg 250 mL/hr over 120 Minutes Intravenous Every 12 hours 03/14/15 1204     03/14/15 1400  meropenem (MERREM) 1 g in sodium chloride 0.9 %  100 mL IVPB     1 g 200 mL/hr over 30 Minutes Intravenous 3 times per day 03/14/15 0845     03/14/15 0600  metroNIDAZOLE (FLAGYL) IVPB 500 mg  Status:  Discontinued     500 mg 100 mL/hr over 60 Minutes Intravenous Every 8 hours 03/14/15 0254 03/14/15 0845   03/14/15 0600  vancomycin (VANCOCIN) 1,750 mg in sodium chloride 0.9 % 500 mL IVPB  Status:  Discontinued     1,750 mg 250 mL/hr over 120 Minutes Intravenous Every 12 hours 03/14/15 0254 03/14/15 1204   03/14/15 0500  aztreonam (AZACTAM) 2 g in dextrose 5 % 50 mL IVPB  Status:  Discontinued     2 g 100 mL/hr over 30 Minutes Intravenous 3 times per day 03/14/15 0254 03/14/15 0845   03/13/15 2030  aztreonam (AZACTAM) 2 g in dextrose 5 % 50 mL IVPB     2 g 100 mL/hr over 30 Minutes Intravenous  Once 03/13/15 2023 03/13/15 2140   03/13/15 2030  metroNIDAZOLE (FLAGYL) IVPB 500 mg     500 mg 100 mL/hr over 60 Minutes Intravenous  Once 03/13/15 2023 03/13/15 2226   03/13/15 2030  vancomycin (VANCOCIN) IVPB 1000 mg/200 mL premix     1,000 mg 200 mL/hr over 60 Minutes Intravenous  Once 03/13/15 2023 03/14/15 0047      Review of Systems  Constitutional: Negative for fever and chills.  HENT: Negative for congestion and sore throat.   Eyes: Negative for blurred vision.  Respiratory: Negative for shortness of breath.   Cardiovascular: Negative for chest pain, palpitations and leg swelling.  Gastrointestinal:  Negative for heartburn, nausea, vomiting, abdominal pain, diarrhea and constipation.  Genitourinary: Negative for dysuria, urgency and frequency.       Positive for tenderness of right groin/vulva with bump noted, improved since yesterday.   Musculoskeletal: Negative for myalgias, back pain and neck pain.       Positive for left toe pain  Skin: Negative for itching and rash.  Neurological: Negative for dizziness, tingling, weakness and headaches.  Endo/Heme/Allergies: Does not bruise/bleed easily.  Psychiatric/Behavioral: Negative for depression and suicidal ideas. The patient does not have insomnia.     Blood pressure 135/55, pulse 81, temperature 99.2 F (37.3 C), temperature source Oral, resp. rate 18, height _0  (1.753 m), weight 265 lb 8 oz (120.43 kg), SpO2 98 %. Physical Exam  Constitutional: She is oriented to person, place, and time. She appears well-developed and well-nourished. No distress.  HENT:  Head: Normocephalic and atraumatic.  Right Ear: External ear normal.  Left Ear: External ear normal.  Nose: Nose normal.  Mouth/Throat: No oropharyngeal exudate.  Eyes: Conjunctivae are normal. Pupils are equal, round, and reactive to light. Right eye exhibits no discharge. Left eye exhibits no discharge.  Neck: Normal range of motion. Neck supple. No tracheal deviation present. No thyromegaly present.  Cardiovascular: Normal rate, regular rhythm and normal heart sounds.  Exam reveals no friction rub.   No murmur heard. Respiratory: Effort normal and breath sounds normal. No respiratory distress. She has no wheezes. She has no rales.  GI: Soft. Bowel sounds are normal. She exhibits no distension and no mass. There is no tenderness.  Genitourinary: No vaginal discharge found.  Right vulvar region with cutaneous abscess noted, mildly tender, mild erythema, 2 cm wide, with fluctuance, no induration.  Internal exam deferred.   Musculoskeletal: Normal range of motion. She exhibits no  edema or tenderness.  Left foot bandaged.   Neurological:  She is alert and oriented to person, place, and time.  Skin: Skin is warm and dry. She is not diaphoretic. No erythema.  Psychiatric: She has a normal mood and affect. Her behavior is normal.    Results for orders placed or performed during the hospital encounter of 03/13/15 (from the past 48 hour(s))  CBC with Differential     Status: Abnormal   Collection Time: 03/13/15  5:09 PM  Result Value Ref Range   WBC 20.1 (H) 3.6 - 11.0 K/uL   RBC 4.86 3.80 - 5.20 MIL/uL   Hemoglobin 14.4 12.0 - 16.0 g/dL   HCT 43.3 35.0 - 47.0 %   MCV 89.2 80.0 - 100.0 fL   MCH 29.6 26.0 - 34.0 pg   MCHC 33.2 32.0 - 36.0 g/dL   RDW 13.0 11.5 - 14.5 %   Platelets 245 150 - 440 K/uL   Neutrophils Relative % 83 %   Neutro Abs 16.7 (H) 1.4 - 6.5 K/uL   Lymphocytes Relative 9 %   Lymphs Abs 1.8 1.0 - 3.6 K/uL   Monocytes Relative 6 %   Monocytes Absolute 1.2 (H) 0.2 - 0.9 K/uL   Eosinophils Relative 1 %   Eosinophils Absolute 0.3 0 - 0.7 K/uL   Basophils Relative 1 %   Basophils Absolute 0.2 (H) 0 - 0.1 K/uL  Comprehensive metabolic panel     Status: Abnormal   Collection Time: 03/13/15  5:09 PM  Result Value Ref Range   Sodium 131 (L) 135 - 145 mmol/L   Potassium 4.4 3.5 - 5.1 mmol/L   Chloride 98 (L) 101 - 111 mmol/L   CO2 23 22 - 32 mmol/L   Glucose, Bld 237 (H) 65 - 99 mg/dL   BUN 14 6 - 20 mg/dL   Creatinine, Ser 0.87 0.44 - 1.00 mg/dL   Calcium 8.8 (L) 8.9 - 10.3 mg/dL   Total Protein 7.7 6.5 - 8.1 g/dL   Albumin 3.4 (L) 3.5 - 5.0 g/dL   AST 13 (L) 15 - 41 U/L   ALT 15 14 - 54 U/L   Alkaline Phosphatase 81 38 - 126 U/L   Total Bilirubin 0.8 0.3 - 1.2 mg/dL   GFR calc non Af Amer >60 >60 mL/min   GFR calc Af Amer >60 >60 mL/min    Comment: (NOTE) The eGFR has been calculated using the CKD EPI equation. This calculation has not been validated in all clinical situations. eGFR's persistently <60 mL/min signify possible Chronic  Kidney Disease.    Anion gap 10 5 - 15  Protime-INR     Status: Abnormal   Collection Time: 03/13/15  5:09 PM  Result Value Ref Range   Prothrombin Time 15.5 (H) 11.4 - 15.0 seconds   INR 1.21   Lactic acid, plasma     Status: None   Collection Time: 03/13/15  8:38 PM  Result Value Ref Range   Lactic Acid, Venous 1.8 0.5 - 2.0 mmol/L  Blood Culture (routine x 2)     Status: None (Preliminary result)   Collection Time: 03/13/15  8:39 PM  Result Value Ref Range   Specimen Description BLOOD LEFT ASSIST CONTROL    Special Requests BOTTLES DRAWN AEROBIC AND ANAEROBIC 7ML    Culture NO GROWTH < 12 HOURS    Report Status PENDING   Urinalysis complete, with microscopic ([place] only)     Status: Abnormal   Collection Time: 03/14/15  1:01 AM  Result Value Ref Range   Color,  Urine AMBER (A) YELLOW   APPearance HAZY (A) CLEAR   Glucose, UA >500 (A) NEGATIVE mg/dL   Bilirubin Urine NEGATIVE NEGATIVE   Ketones, ur TRACE (A) NEGATIVE mg/dL   Specific Gravity, Urine 1.022 1.005 - 1.030   Hgb urine dipstick 1+ (A) NEGATIVE   pH 5.0 5.0 - 8.0   Protein, ur 100 (A) NEGATIVE mg/dL   Nitrite NEGATIVE NEGATIVE   Leukocytes, UA NEGATIVE NEGATIVE   RBC / HPF 0-5 0 - 5 RBC/hpf   WBC, UA 6-30 0 - 5 WBC/hpf   Bacteria, UA RARE (A) NONE SEEN   Squamous Epithelial / LPF 0-5 (A) NONE SEEN   Mucous PRESENT    Hyaline Casts, UA PRESENT   Blood Culture (routine x 2)     Status: None (Preliminary result)   Collection Time: 03/14/15  2:59 AM  Result Value Ref Range   Specimen Description BLOOD RIGHT ANTECUBITAL    Special Requests BOTTLES DRAWN AEROBIC AND ANAEROBIC 5ML    Culture NO GROWTH < 12 HOURS    Report Status PENDING   CBC     Status: Abnormal   Collection Time: 03/14/15  2:59 AM  Result Value Ref Range   WBC 18.3 (H) 3.6 - 11.0 K/uL   RBC 4.04 3.80 - 5.20 MIL/uL   Hemoglobin 11.9 (L) 12.0 - 16.0 g/dL   HCT 36.4 35.0 - 47.0 %   MCV 90.1 80.0 - 100.0 fL   MCH 29.5 26.0 - 34.0 pg   MCHC  32.7 32.0 - 36.0 g/dL   RDW 13.1 11.5 - 14.5 %   Platelets 215 150 - 440 K/uL  Creatinine, serum     Status: None   Collection Time: 03/14/15  2:59 AM  Result Value Ref Range   Creatinine, Ser 0.78 0.44 - 1.00 mg/dL   GFR calc non Af Amer >60 >60 mL/min   GFR calc Af Amer >60 >60 mL/min    Comment: (NOTE) The eGFR has been calculated using the CKD EPI equation. This calculation has not been validated in all clinical situations. eGFR's persistently <60 mL/min signify possible Chronic Kidney Disease.   Hemoglobin A1c     Status: Abnormal   Collection Time: 03/14/15  2:59 AM  Result Value Ref Range   Hgb A1c MFr Bld 11.6 (H) 4.0 - 6.0 %  CBC     Status: Abnormal   Collection Time: 03/14/15  5:37 AM  Result Value Ref Range   WBC 14.6 (H) 3.6 - 11.0 K/uL   RBC 3.90 3.80 - 5.20 MIL/uL   Hemoglobin 11.8 (L) 12.0 - 16.0 g/dL   HCT 35.6 35.0 - 47.0 %   MCV 91.4 80.0 - 100.0 fL   MCH 30.2 26.0 - 34.0 pg   MCHC 33.0 32.0 - 36.0 g/dL   RDW 13.3 11.5 - 14.5 %   Platelets 195 150 - 440 K/uL  Basic metabolic panel     Status: Abnormal   Collection Time: 03/14/15  5:37 AM  Result Value Ref Range   Sodium 133 (L) 135 - 145 mmol/L   Potassium 3.9 3.5 - 5.1 mmol/L   Chloride 104 101 - 111 mmol/L   CO2 24 22 - 32 mmol/L   Glucose, Bld 281 (H) 65 - 99 mg/dL   BUN 20 6 - 20 mg/dL   Creatinine, Ser 0.89 0.44 - 1.00 mg/dL   Calcium 7.5 (L) 8.9 - 10.3 mg/dL   GFR calc non Af Amer >60 >60 mL/min   GFR  calc Af Amer >60 >60 mL/min    Comment: (NOTE) The eGFR has been calculated using the CKD EPI equation. This calculation has not been validated in all clinical situations. eGFR's persistently <60 mL/min signify possible Chronic Kidney Disease.    Anion gap 5 5 - 15  Glucose, capillary     Status: Abnormal   Collection Time: 03/14/15  8:22 AM  Result Value Ref Range   Glucose-Capillary 254 (H) 65 - 99 mg/dL  Glucose, capillary     Status: Abnormal   Collection Time: 03/14/15 11:34 AM   Result Value Ref Range   Glucose-Capillary 193 (H) 65 - 99 mg/dL  Glucose, capillary     Status: Abnormal   Collection Time: 03/14/15  4:22 PM  Result Value Ref Range   Glucose-Capillary 165 (H) 65 - 99 mg/dL    Dg Toe 5th Left  03/13/2015  CLINICAL DATA:  Patient with discoloration about the fifth toe. EXAM: DG TOE 5TH LEFT COMPARISON:  Foot radiograph 05/27/2011. FINDINGS: Oblique view demonstrates distal amputation of the second digit. There is soft tissue swelling about the fifth digit. No definite evidence for acute displaced fracture or cortical osseous destruction. IMPRESSION: Soft tissue swelling about the fifth digit. No definite evidence for underlying fracture or osseous destruction. Electronically Signed   By: Lovey Newcomer M.D.   On: 03/13/2015 18:35    Assessment/Plan: 1. Right vulvar cutaneous abscess (recurrent) - Patient notes improvement since yesterday (less tender, less erythema, and has decreased in size).  Denies any drainage from lesion.  Has been on antibiotics since yesterday.  Recommend continuing antibiotics and applying warm compresses to area.  Does not warrant I&D at this time.  Discussed methods of prevention of future abscesses, such as decreasing risk factors (obesity, better glycemic control,) and counseled on appropriate hygiene.  Will follow patient again tomorrow, if no further improvement, or symptoms worsen, can consider I&D at that time if more induration noted.  2. Diabetic foot ulcer with abscess and cellulitis with necrosis - Patient states that she is scheduled to undergo likely amputation of toe on Thursday.  Currently being managed by Dr. Vickki Muff, Podiatrist. 3. Diabetes mellitus  - Patient has been seen by Diabetes coordinator.   Thank you for the consult.  Will follow up with patient tomorrow.    Rubie Maid, MD [facility] 03/14/2015

## 2015-03-14 NOTE — Progress Notes (Signed)
Inpatient Diabetes Program Recommendations  AACE/ADA: New Consensus Statement on Inpatient Glycemic Control (2015)  Target Ranges:  Prepandial:   less than 140 mg/dL      Peak postprandial:   less than 180 mg/dL (1-2 hours)      Critically ill patients:  140 - 180 mg/dL   Review of Glycemic Control  Results for Junie PanningDICKERSON, Jelissa S (MRN [id number]) as of 03/14/2015 09:06  Ref. Range 03/14/2015 08:22  Glucose-Capillary Latest Ref Range: 65-99 mg/dL 409254 (H)    Diabetes history: Type 2 Outpatient Diabetes medications: confirmed with patient- Novulin 70/30 40 units bid, Metformin 1000mg  qam, 1500mg  qpm Current orders for Inpatient glycemic control: Novolog 70/30 40 units bid, Novolog 0-15 units tid, Novolog 4 units tid with meals.   Inpatient Diabetes Program Recommendations:  Patient reports that although she is taking her medications most of the time, blood sugars remain above 200mg /dl.  Please hold mealtime Novolog if patient eats less than 50% of her meal.   Susette RacerJulie Merritt Kibby, RN, BA, [place]HA, CDE Diabetes Coordinator Inpatient Diabetes Program  [phone] (Team Pager) [phone] [facility] Office) 03/14/2015 9:09 AM

## 2015-03-14 NOTE — Consult Note (Signed)
ORTHOPAEDIC CONSULTATION  REQUESTING PHYSICIAN: Katha HammingSnehalatha Konidena, MD  Chief Complaint: left 5th toe ulceration  HPI: Nancy [name] is a 50 y.o. female who complains of  Infection left 5th toe.  Well known to me.  Has been seen for superficial ulcer to left 5th toe.  States wound was "fine" over the weekend but noticed discoloration yesterday. Has been feeling sick and believed she had a virus.  Noticed redness and swelling and seen in ER.  WBC was elevated and admitted for further w/u for diabetic foot infection.    Past Medical History  Diagnosis Date  . Diabetes mellitus   . Eczema   . Arthritis     r knee  . Anxiety     panic attacks  . Asthma      tx er 08/2010  . Unspecified essential hypertension     [facility]PREVIOUSLY SEEN BY [place].ECHO 2008  . Atherosclerosis of coronary artery 05/10/2014  . [facility] (congestive cardiac failure) (HCC) 06/06/2008    30% EF   . Acid reflux 05/10/2014  . Apical ballooning syndrome 05/10/2014   Past Surgical History  Procedure Laterality Date  . Cholecystectomy    . Kidney stone removal    . Tonsillectomy    . Cardiac catheterization      2011 followed by dr bensimohn  . Knee arthroscopy w/ meniscal repair    . Total knee arthroplasty  01/11/2011    Procedure: TOTAL KNEE ARTHROPLASTY;  Surgeon: Harvie JuniorJohn L Graves;  Location: [facility] OR;  Service: Orthopedics;  Laterality: Right;  COMPUTER ASSISTED TOTAL KNEE REPLACEMENT  . Nose surgery  1991    due to fracture  . Toe amputation      second toe   Social History   Social History  . Marital Status: Single    Spouse Name: N/A  . Number of Children: N/A  . Years of Education: N/A   Social History Main Topics  . Smoking status: Current Every Day Smoker -- 0.50 packs/day for 30 years    Types: Cigarettes  . Smokeless tobacco: None  . Alcohol Use: Yes     Comment: occasional  . Drug Use: No  . Sexual Activity: Not Asked   Other Topics Concern  . None   Social  History Narrative   Full time. Does not regularly exercise. Domestic partner.    Family History  Problem Relation Age of Onset  . Cancer Other   . Crohn's disease Other   . GER disease Other   . Transient ischemic attack Mother   . Asthma Mother   . Hypertension Mother   . Lung cancer Father   . Hypertension Father   . Asthma Sister   . Anxiety disorder Sister   . Stomach cancer Maternal Grandmother   . Colon cancer Maternal Grandmother   . Leukemia Maternal Grandmother   . Melanoma Maternal Grandfather   . Asthma Sister   . Hypertension Brother    Allergies  Allergen Reactions  . Shellfish Allergy Anaphylaxis  . Morphine And Related Other (See Comments)    Reaction:  Severe migraines   . Percocet [Oxycodone-Acetaminophen] Nausea And Vomiting  . Penicillins Rash and Other (See Comments)    Has patient had a PCN reaction causing immediate rash, facial/tongue/throat swelling, SOB or lightheadedness with hypotension: No Has patient had a PCN reaction causing severe rash involving mucus membranes or skin necrosis: No Has patient had a PCN reaction that required hospitalization No Has patient had a PCN  reaction occurring within the last 10 years: No If all of the above answers are "NO", then may proceed with Cephalosporin use.  . Sulfa Antibiotics Rash  . Theophyllines Itching and Rash   Prior to Admission medications   Medication Sig Start Date End Date Taking? Authorizing Provider  albuterol (PROVENTIL HFA;VENTOLIN HFA) 108 (90 BASE) MCG/ACT inhaler Inhale 2 puffs into the lungs every 4 (four) hours as needed for wheezing or shortness of breath.    Yes Historical Provider, MD  aspirin EC 81 MG tablet Take 81 mg by mouth at bedtime.   Yes Historical Provider, MD  aspirin-acetaminophen-caffeine (EXCEDRIN MIGRAINE) [id number] MG tablet Take 2 tablets by mouth every 6 (six) hours as needed for migraine.   Yes Historical Provider, MD  carvedilol (COREG) 3.125 MG tablet Take 3.125 mg  by mouth 2 (two) times daily with a meal.     Yes Historical Provider, MD  diazepam (VALIUM) 10 MG tablet Take 5-10 mg by mouth every 6 (six) hours as needed for anxiety.    Yes Historical Provider, MD  ibuprofen (ADVIL,MOTRIN) 800 MG tablet Take 800 mg by mouth every 8 (eight) hours as needed (for migraines).    Yes Historical Provider, MD  insulin NPH-regular Human (NOVOLIN 70/30) (70-30) 100 UNIT/ML injection Inject 40 Units into the skin 2 (two) times daily.    Yes Historical Provider, MD  lisinopril (PRINIVIL,ZESTRIL) 10 MG tablet Take 10 mg by mouth 2 (two) times daily.   Yes Historical Provider, MD  metFORMIN (GLUCOPHAGE) 500 MG tablet Take 1,000-1,500 mg by mouth 2 (two) times daily with a meal. Pt takes two tablets with breakfast and three tablets with dinner.   Yes Historical Provider, MD  pravastatin (PRAVACHOL) 40 MG tablet Take 1 tablet (40 mg total) by mouth at bedtime. 11/09/14  Yes Lorie Phenix, MD  promethazine (PHENERGAN) 25 MG tablet Take 25 mg by mouth every 6 (six) hours as needed for nausea or vomiting.    Yes Historical Provider, MD  traMADol (ULTRAM) 50 MG tablet Take 25-50 mg by mouth every 4 (four) hours as needed (for migraines).   Yes Historical Provider, MD   Dg Toe 5th Left  03/13/2015  CLINICAL DATA:  Patient with discoloration about the fifth toe. EXAM: DG TOE 5TH LEFT COMPARISON:  Foot radiograph 05/27/2011. FINDINGS: Oblique view demonstrates distal amputation of the second digit. There is soft tissue swelling about the fifth digit. No definite evidence for acute displaced fracture or cortical osseous destruction. IMPRESSION: Soft tissue swelling about the fifth digit. No definite evidence for underlying fracture or osseous destruction. Electronically Signed   By: Annia Belt M.D.   On: 03/13/2015 18:35    Positive ROS: All other systems have been reviewed and were otherwise negative with the exception of those mentioned in the HPI and as above.  12 point ROS was  performed.  Physical Exam: General: Alert and oriented.  No apparent distress.  Vascular:  Left foot:Dorsalis Pedis:  present Posterior Tibial:  present  Right foot: Dorsalis Pedis:  present Posterior Tibial:  present  Neuro:absent sensation to b/l lower extremities  Derm:Left 5th toe with marked eccymosis and necrosis.  Noted cellulitis diffuse to left lower leg and foot.  Small superficial eccymotic circle proximal to MTPJ.  Purulence from PIPJ upon removal of superficial ulceration.  Wound culture performed  Ortho/MS: Diffuse edema to left foot and lower leg. Rigid hammertoe contracture to toes.  Pt s/p paritial 2nd toe amp left.  Right foot without issue.  Assessment: Diabetic foot ulcer with abscess and cellulitis. Diabetic neuropathy  Plan: Wound culture performed.  Dressing applied. Xray is negative but pt with marked elevated WBC at 20k and trending down but still elevated.  With eccymosis proximal to mtpj concern for abscess and will order MRI.  Toe is necrotic and d/w pt will likely need to amputate toe.  Likely plan for Thursday.  Will f/u tomorrow.    Irean Hong, DPM Cell [phone]   03/14/2015 5:03 PM

## 2015-03-14 NOTE — Progress Notes (Signed)
Dr.Konidena here to see patient discussing plan of care.  

## 2015-03-14 NOTE — Progress Notes (Signed)
Pharmacy Antibiotic Note  Nancy [name] is a 50 y.o. female admitted on 03/13/2015 with cellulitis/sepsis.  Pharmacy was originally consulted on for vancomycin, metronidazole, and aztreonam dosing. After talking to Dr. Luberta MutterKonidena patient was changed to meropenem and vancomycin for diabetic foot infection.   Plan: DW 88kg  Vd 61.6L   Ke: 0.093 hr-1  T1/2 7 hours  Goal Trough: 15-20  Will change patients dose to Vancomycin 1500mg  q12 hours. Estimated trough at [facility] is 15.7.  Level before 5th dose.   Height: 5\' 9"  (175.3 cm) Weight: 265 lb 8 oz (120.43 kg) IBW/kg (Calculated) : 66.2  Temp (24hrs), Avg:99.2 F (37.3 C), Min:98.4 F (36.9 C), Max:100.4 F (38 C)   Recent Labs Lab 03/13/15 1709 03/13/15 2038 03/14/15 0259 03/14/15 0537  WBC 20.1*  --  18.3* 14.6*  CREATININE 0.87  --  0.78 0.89  LATICACIDVEN  --  1.8  --   --     Estimated Creatinine Clearance: 104.9 mL/min (by C-G formula based on Cr of 0.89).    Allergies  Allergen Reactions  . Shellfish Allergy Anaphylaxis  . Morphine And Related Other (See Comments)    Reaction:  Severe migraines   . Percocet [Oxycodone-Acetaminophen] Nausea And Vomiting  . Penicillins Rash and Other (See Comments)    Has patient had a PCN reaction causing immediate rash, facial/tongue/throat swelling, SOB or lightheadedness with hypotension: No Has patient had a PCN reaction causing severe rash involving mucus membranes or skin necrosis: No Has patient had a PCN reaction that required hospitalization No Has patient had a PCN reaction occurring within the last 10 years: No If all of the above answers are "NO", then may proceed with Cephalosporin use.  . Sulfa Antibiotics Rash  . Theophyllines Itching and Rash    Antimicrobials this admission:  Aztreonam 2gm >>3/6-3/7    Flagyl 500mg >> 3/6-3/7 Vancomycin >> 3/6-  Meropenem >> 3/7-   Microbiology results: 3/6 BCx: pending 3/7 UCx: pending   3/7 UA: LE(-) NO2 (-) WBC  6-30  Thank you for allowing pharmacy to be a part of this patient's care.  Cher NakaiSheema Saben Donigan, PharmD Pharmacy Resident  03/14/2015 12:04 PM

## 2015-03-14 NOTE — Progress Notes (Signed)
Pharmacy Antibiotic Note  Nancy [name] is a 50 y.o. female admitted on 03/13/2015 with cellulitis.  Pharmacy has been consulted for vancomycin, metronidazole, and aztreonam dosing.  Plan: DW 88kg  Vd 61.6L kei 0.093 hr-1  T1/2 7 hours Vancomycin 1750 mg IV q 12 hours ordered. Level before 5th dose. Goal trough 15-20.  Metronidazole 500 mg IV q 8 hours ordered.   Aztreonam 2 grams IV q 8 hours ordered.  Height: 5\' 9"  (175.3 cm) Weight: 266 lb (120.657 kg) IBW/kg (Calculated) : 66.2  Temp (24hrs), Avg:99.2 F (37.3 C), Min:98.4 F (36.9 C), Max:100.4 F (38 C)   Recent Labs Lab 03/13/15 1709 03/13/15 2038  WBC 20.1*  --   CREATININE 0.87  --   LATICACIDVEN  --  1.8    Estimated Creatinine Clearance: 107.5 mL/min (by C-G formula based on Cr of 0.87).    Allergies  Allergen Reactions  . Shellfish Allergy Anaphylaxis  . Morphine And Related Other (See Comments)    Reaction:  Severe migraines   . Percocet [Oxycodone-Acetaminophen] Nausea And Vomiting  . Penicillins Rash and Other (See Comments)    Has patient had a PCN reaction causing immediate rash, facial/tongue/throat swelling, SOB or lightheadedness with hypotension: No Has patient had a PCN reaction causing severe rash involving mucus membranes or skin necrosis: No Has patient had a PCN reaction that required hospitalization No Has patient had a PCN reaction occurring within the last 10 years: No If all of the above answers are "NO", then may proceed with Cephalosporin use.  . Sulfa Antibiotics Rash  . Theophyllines Itching and Rash    Antimicrobials this admission:   >>    >>   Dose adjustments this admission:   Microbiology results: 3/6 BCx: pending 3/7 UCx: pending   Sputum:    MRSA PCR:   3/7 UA: LE(-) NO2 (-) WBC 6-30  Thank you for allowing pharmacy to be a part of this patient's care.  Cloteal Isaacson S 03/14/2015 2:54 AM

## 2015-03-14 NOTE — Consult Note (Signed)
WOC wound consult note Reason for Consult:Left fifth metatarsal neuropathic ulcer.  Had upcoming hammer toe procedure scheduled, but wound has worsened.  Pain is reported in the toe and right dorsal foot. Erythema is noted to right dorsal foot. X-ray did not show osteomyelitis Wound type:Neuropathic ulcer with hammer toe to right fifth metatarsal  Pressure Ulcer POA: Yes Measurement: Entire toe is blackened with 0.5 cm round opening to dorsal aspect.  Toe is bent with obvious hammer toe deformity.  Has abscess to right labia and GYN has been consulted for this.  Wound WGN:FAOZHbed:Right fifth toe dark red, edematous Drainage (amount, consistency, odor) Minimal serosanguinous  No odor Periwound:Erythema and edema Dressing procedure/placement/frequency:Cleanse right fifth toe with NS and pat gently dry.  Wrap toe with small strip of Aquacel Ag.  Cover toe with 2x2 and tape.  Change daily.  Will not follow at this time.  Please re-consult if needed.  Maple HudsonKaren Roselene Gray RN BSN CWON Pager [phone]

## 2015-03-14 NOTE — Progress Notes (Signed)
[facility] [place] at [facility]   PATIENT NAME: Jamica Woodyard    MR#:  [id number]  DATE OF BIRTH:  11/06/1965  SUBJECTIVE: 50 year old female patient with history of for diabetes mellitus, hypertension, gonorrhea artery disease admitted because of infection in the left little toe. Noted to have redness swelling now changed to black discoloration of the left little toe. Patient has hammertoe and was about to get the amputation done by Dr. Myrle Sheng April 9. She says she feels little better today. Has right leg labial abscess which is a recurrent problem and supposed to see Dr. Hildred Laser from GYN, today but because she is admitted, she wants a GYN to see her for possible abscess drainage of right labial abscess.. Tender to touch in that area.   CHIEF COMPLAINT:   Chief Complaint  Patient presents with  . Toe Injury    REVIEW OF SYSTEMS:    Review of Systems  Constitutional: Negative for fever and chills.  HENT: Negative for hearing loss.   Eyes: Negative for blurred vision, double vision and photophobia.  Respiratory: Negative for cough, hemoptysis and shortness of breath.   Cardiovascular: Negative for chest pain, palpitations, orthopnea and leg swelling.  Gastrointestinal: Negative for vomiting, abdominal pain and diarrhea.  Genitourinary: Negative for dysuria and urgency.       Right labial area swollen,tender to palpation.  Musculoskeletal: Negative for myalgias and neck pain.       Little toe on left foot discolored,red and tender to  Palpation.  Skin: Negative for rash.  Neurological: Negative for dizziness, focal weakness, seizures, weakness and headaches.  Psychiatric/Behavioral: Negative for memory loss. The patient does not have insomnia.     Nutrition:  Tolerating Diet: Tolerating PT:      DRUG ALLERGIES:   Allergies  Allergen Reactions  . Shellfish Allergy Anaphylaxis  . Morphine And Related Other (See Comments)    Reaction:   Severe migraines   . Percocet [Oxycodone-Acetaminophen] Nausea And Vomiting  . Penicillins Rash and Other (See Comments)    Has patient had a PCN reaction causing immediate rash, facial/tongue/throat swelling, SOB or lightheadedness with hypotension: No Has patient had a PCN reaction causing severe rash involving mucus membranes or skin necrosis: No Has patient had a PCN reaction that required hospitalization No Has patient had a PCN reaction occurring within the last 10 years: No If all of the above answers are "NO", then may proceed with Cephalosporin use.  . Sulfa Antibiotics Rash  . Theophyllines Itching and Rash    VITALS:  Blood pressure 134/63, pulse 77, temperature 99 F (37.2 C), temperature source Oral, resp. rate 18, height  (1.753 m), weight 120.43 kg (265 lb 8 oz), SpO2 100 %.  PHYSICAL EXAMINATION:   Physical Exam  GENERAL:  51 y.o.-year-old patient lying in the bed with no acute distress.  EYES: Pupils equal, round, reactive to light and accommodation. No scleral icterus. Extraocular muscles intact.  HEENT: Head atraumatic, normocephalic. Oropharynx and nasopharynx clear.  NECK:  Supple, no jugular venous distention. No thyroid enlargement, no tenderness.  LUNGS: Normal breath sounds bilaterally, no wheezing, rales,rhonchi or crepitation. No use of accessory muscles of respiration.  CARDIOVASCULAR: S1, S2 normal. No murmurs, rubs, or gallops.  ABDOMEN: Soft, nontender, nondistended. Bowel sounds present. No organomegaly or mass.  EXTREMITIES: Patient has redness and tenderness at the left foot, black discoloration noted on left little toe.Marland Kitchen  NEUROLOGIC: Cranial nerves II through XII are intact. Muscle strength 5/5 in  all extremities. Sensation intact. Gait not checked.  PSYCHIATRIC: The patient is alert and oriented x 3.  SKIN; slightly lower area swollen and tender to palpation, some fluctuation present.   LABORATORY PANEL:   CBC  Recent Labs Lab  03/14/15 0537  WBC 14.6*  HGB 11.8*  HCT 35.6  PLT 195   ------------------------------------------------------------------------------------------------------------------  Chemistries   Recent Labs Lab 03/13/15 1709  03/14/15 0537  NA 131*  --  133*  K 4.4  --  3.9  CL 98*  --  104  CO2 23  --  24  GLUCOSE 237*  --  281*  BUN 14  --  20  CREATININE 0.87  < > 0.89  CALCIUM 8.8*  --  7.5*  AST 13*  --   --   ALT 15  --   --   ALKPHOS 81  --   --   BILITOT 0.8  --   --   < > = values in this interval not displayed. ------------------------------------------------------------------------------------------------------------------  Cardiac Enzymes No results for input(s): TROPONINI in the last 168 hours. ------------------------------------------------------------------------------------------------------------------  RADIOLOGY:  Dg Toe 5th Left  03/13/2015  CLINICAL DATA:  Patient with discoloration about the fifth toe. EXAM: DG TOE 5TH LEFT COMPARISON:  Foot radiograph 05/27/2011. FINDINGS: Oblique view demonstrates distal amputation of the second digit. There is soft tissue swelling about the fifth digit. No definite evidence for acute displaced fracture or cortical osseous destruction. IMPRESSION: Soft tissue swelling about the fifth digit. No definite evidence for underlying fracture or osseous destruction. Electronically Signed   By: Annia Beltrew  Davis M.D.   On: 03/13/2015 18:35     ASSESSMENT AND PLAN:   Principal Problem:   Diabetic foot infection (HCC) Active Problems:   Essential hypertension   Diabetes mellitus, type 2 (HCC)   Sepsis secondary to diabetic foot infection   Tobacco abuse   Hyponatremia   1. sepsis secondary to diabetic foot infection: Antibiotics were changed to meropenem and vancomycin. Patient WBC is improved. Bariatric Dr. Ether GriffinsFowler to see her because patient has seen him before, has left foot hammertoe , now with infection. Continue pain medicine.  Continue IV hydration for today.blood cultures no growth so far. 2 right labial abscess: GYN consult for possible drainage. Continue warm compressions.  3. diabetes mellitus type 2: Continue cerumen with coverage, NovoLog 70 /30  40 units twice a day, #4 hypertension controlled. #5 hyperlipidemia continue statins. #6 hyponatremia due to dehydration: Continue IV hydration, recheck sodium tomorrow. All the records are reviewed and case discussed with Care Management/Social Workerr. Management plans discussed with the patient, family and they are in agreement.  CODE STATUS: Full  TOTAL TIME TAKING CARE OF THIS PATIENT: 35 minutes.   POSSIBLE D/C IN 2-3 DAYS, DEPENDING ON CLINICAL CONDITION.   Katha HammingKONIDENA,Ruthel Martine M.D on 03/14/2015 at 8:46 AM  Between 7am to 6pm - Pager - [phone]  After 6pm go to [email or web address] - password [email or web address] [facility]  [facility]  Office  [phone]  CC: Primary care physician; Lorie PhenixNancy Maloney, MD

## 2015-03-15 ENCOUNTER — Inpatient Hospital Stay: Payer: Commercial Managed Care - HMO

## 2015-03-15 DIAGNOSIS — N764 Abscess of vulva: Secondary | ICD-10-CM

## 2015-03-15 LAB — GLUCOSE, CAPILLARY
GLUCOSE-CAPILLARY: 90 mg/dL (ref 65–99)
Glucose-Capillary: 137 mg/dL — ABNORMAL HIGH (ref 65–99)
Glucose-Capillary: 159 mg/dL — ABNORMAL HIGH (ref 65–99)
Glucose-Capillary: 96 mg/dL (ref 65–99)
Glucose-Capillary: 99 mg/dL (ref 65–99)

## 2015-03-15 LAB — URINE CULTURE

## 2015-03-15 LAB — VANCOMYCIN, TROUGH: VANCOMYCIN TR: 12 ug/mL (ref 10–20)

## 2015-03-15 MED ORDER — VANCOMYCIN HCL 10 G IV SOLR
2000.0000 mg | Freq: Two times a day (BID) | INTRAVENOUS | Status: DC
Start: 2015-03-16 — End: 2015-03-17
  Administered 2015-03-16: 2000 mg via INTRAVENOUS
  Filled 2015-03-15 (×4): qty 2000

## 2015-03-15 MED ORDER — DOCUSATE SODIUM 100 MG PO CAPS
100.0000 mg | ORAL_CAPSULE | Freq: Two times a day (BID) | ORAL | Status: DC
  Administered 2015-03-15 – 2015-03-17 (×5): 100 mg via ORAL
  Filled 2015-03-15 (×5): qty 1

## 2015-03-15 MED ORDER — GADOBENATE DIMEGLUMINE 529 MG/ML IV SOLN
20.0000 mL | Freq: Once | INTRAVENOUS | Status: AC | PRN
  Administered 2015-03-15: 20 mL via INTRAVENOUS

## 2015-03-15 NOTE — Progress Notes (Addendum)
Patient got very short of breath due to housekeeping cleaning with bleach near patient's room, given albuterol treatment, RT at bedside.  Pt stating - I am feeling much better post breathing treatment.

## 2015-03-15 NOTE — Progress Notes (Signed)
Daily Progress Note   Subjective  - * No surgery date entered *  F/u MRI left foot.  Left 5th toe infection  Objective Filed Vitals:   03/15/15 0404 03/15/15 0757 03/15/15 1548 03/15/15 1644  BP: 141/71 134/57 157/79   Pulse: 77 78 78   Temp: 98.3 F (36.8 C) 99.9 F (37.7 C) 100.2 F (37.9 C)   TempSrc: Oral Oral Oral   Resp: 17 18 18    Height:      Weight:      SpO2: 100% 99% 99% 98%    Physical Exam: MRI - for osteo or abscess to foot. L 5th Toe is necrotic with purulence and diffuse erythema to foot.    Laboratory CBC    Component Value Date/Time   WBC 14.6* 03/14/2015 0537   WBC 9.5 08/21/2012 1523   WBC 8.9 02/07/2012   HGB 11.8* 03/14/2015 0537   HGB 14.3 08/21/2012 1523   HCT 35.6 03/14/2015 0537   HCT 41.3 08/21/2012 1523   PLT 195 03/14/2015 0537   PLT 212 08/21/2012 1523    BMET    Component Value Date/Time   NA 133* 03/14/2015 0537   NA 133* 08/21/2012 1523   NA 136* 02/07/2012   K 3.9 03/14/2015 0537   K 4.1 08/21/2012 1523   CL 104 03/14/2015 0537   CL 102 08/21/2012 1523   CO2 24 03/14/2015 0537   CO2 26 08/21/2012 1523   GLUCOSE 281* 03/14/2015 0537   GLUCOSE 306* 08/21/2012 1523   BUN 20 03/14/2015 0537   BUN 17 08/21/2012 1523   BUN 13 02/07/2012   CREATININE 0.89 03/14/2015 0537   CREATININE 0.69 08/21/2012 1523   CREATININE 0.7 02/07/2012   CALCIUM 7.5* 03/14/2015 0537   CALCIUM 8.8 08/21/2012 1523   GFRNONAA >60 03/14/2015 0537   GFRNONAA >60 08/21/2012 1523   GFRAA >60 03/14/2015 0537   GFRAA >60 08/21/2012 1523    Assessment/Planning: Necrotic left 5th toe with diabetes, pvd, ulcer   Plan for 5th toe amputation tomorrow and I & D   NPO p midnight      Gwyneth RevelsFowler, Dai Mcadams A  03/15/2015, 5:40 PM

## 2015-03-15 NOTE — Progress Notes (Signed)
Dr.Fowler here to see patient- discussing plans for surgery.

## 2015-03-15 NOTE — Progress Notes (Signed)
[facility] - [place] at [facility]   PATIENT NAME: Nancy [name]    MR#:  [id number]  DATE OF BIRTH:  1965-12-19  SUBJECTIVE: 50 year old female patient with history of for diabetes mellitus, hypertension, Coronmary artery disease admitted because of infection in the left little toe. Noted to have redness swelling now changed to black discoloration of the left little toe. Patient has hammertoe and was about to get the amputation done by Dr. Myrle Sheng April 9. She says she feels little better today. Has right leg labial abscess which is a recurrent problem and supposed to see Dr. Hildred Laser from GYN, today but because she is admitted, she wants a GYN to see her for possible abscess drainage of right labial abscess.. Seen today,says her labial abcess pain is better ,and her left foot is wrapped,   CHIEF COMPLAINT:   Chief Complaint  Patient presents with  . Toe Injury    REVIEW OF SYSTEMS:    Review of Systems  Constitutional: Negative for fever and chills.  HENT: Negative for hearing loss.   Eyes: Negative for blurred vision, double vision and photophobia.  Respiratory: Negative for cough, hemoptysis and shortness of breath.   Cardiovascular: Negative for chest pain, palpitations, orthopnea and leg swelling.  Gastrointestinal: Negative for vomiting, abdominal pain and diarrhea.  Genitourinary: Negative for dysuria and urgency.       Right labial area swollen, lesstender to palpation.  Musculoskeletal: Negative for myalgias and neck pain.       Dressing  Present on left foot.,  Skin: Negative for rash.  Neurological: Negative for dizziness, focal weakness, seizures, weakness and headaches.  Psychiatric/Behavioral: Negative for memory loss. The patient does not have insomnia.     Nutrition:  Tolerating Diet: Tolerating PT:      DRUG ALLERGIES:   Allergies  Allergen Reactions  . Shellfish Allergy Anaphylaxis  . Morphine And Related Other (See  Comments)    Reaction:  Severe migraines   . Percocet [Oxycodone-Acetaminophen] Nausea And Vomiting  . Penicillins Rash and Other (See Comments)    Has patient had a PCN reaction causing immediate rash, facial/tongue/throat swelling, SOB or lightheadedness with hypotension: No Has patient had a PCN reaction causing severe rash involving mucus membranes or skin necrosis: No Has patient had a PCN reaction that required hospitalization No Has patient had a PCN reaction occurring within the last 10 years: No If all of the above answers are "NO", then may proceed with Cephalosporin use.  . Sulfa Antibiotics Rash  . Theophyllines Itching and Rash    VITALS:  Blood pressure 157/79, pulse 78, temperature 100.2 F (37.9 C), temperature source Oral, resp. rate 18, height  (1.753 m), weight 120.43 kg (265 lb 8 oz), SpO2 99 %.  PHYSICAL EXAMINATION:   Physical Exam  GENERAL:  50 y.o.-year-old patient lying in the bed with no acute distress.  EYES: Pupils equal, round, reactive to light and accommodation. No scleral icterus. Extraocular muscles intact.  HEENT: Head atraumatic, normocephalic. Oropharynx and nasopharynx clear.  NECK:  Supple, no jugular venous distention. No thyroid enlargement, no tenderness.  LUNGS: Normal breath sounds bilaterally, no wheezing, rales,rhonchi or crepitation. No use of accessory muscles of respiration.  CARDIOVASCULAR: S1, S2 normal. No murmurs, rubs, or gallops.  ABDOMEN: Soft, nontender, nondistended. Bowel sounds present. No organomegaly or mass.  EXTREMITIES: dressing present on left foot,.  NEUROLOGIC: Cranial nerves II through XII are intact. Muscle strength 5/5 in all extremities. Sensation intact. Gait not checked.  PSYCHIATRIC: The patient is alert and oriented x 3.  SKIN; left labial  area swollen and tender to palpation, some fluctuation present.   LABORATORY PANEL:   CBC  Recent Labs Lab 03/14/15 0537  WBC 14.6*  HGB 11.8*  HCT 35.6   PLT 195   ------------------------------------------------------------------------------------------------------------------  Chemistries   Recent Labs Lab 03/13/15 1709  03/14/15 0537  NA 131*  --  133*  K 4.4  --  3.9  CL 98*  --  104  CO2 23  --  24  GLUCOSE 237*  --  281*  BUN 14  --  20  CREATININE 0.87  < > 0.89  CALCIUM 8.8*  --  7.5*  AST 13*  --   --   ALT 15  --   --   ALKPHOS 81  --   --   BILITOT 0.8  --   --   < > = values in this interval not displayed. ------------------------------------------------------------------------------------------------------------------  Cardiac Enzymes No results for input(s): TROPONINI in the last 168 hours. ------------------------------------------------------------------------------------------------------------------  RADIOLOGY:  Mr Foot Left W Wo Contrast  03/15/2015  CLINICAL DATA:  Diabetic full ulcer, infection of the left fifth toe EXAM: MRI OF THE LEFT FOREFOOT WITHOUT AND WITH CONTRAST TECHNIQUE: Multiplanar, multisequence MR imaging was performed both before and after administration of intravenous contrast. CONTRAST:  20mL MULTIHANCE GADOBENATE DIMEGLUMINE 529 MG/ML IV SOLN COMPARISON:  None. FINDINGS: Soft tissue swelling of he left fifth digit. No focal fluid collection or hematoma. No focal marrow signal abnormality. No bone destruction or periosteal reaction. No joint effusion. There is hindfoot valgus. There is subcortical reactive marrow edema involving the lateral process of the talus and calcaneus. Mild osteoarthritis of the calcaneocuboid joint. Mild osteoarthritis of the navicular-medial cuneiform articulation. Mild osteoarthritis of the first MTP joint. Partial-thickness cartilage loss of the posterior tibiotalar joint with subchondral reactive marrow changes. Intact flexor and extensor compartment tendons. IMPRESSION: 1. No evidence of osteomyelitis of the left foot. Electronically Signed   By: Elige Ko   On:  03/15/2015 13:13   Dg Toe 5th Left  03/13/2015  CLINICAL DATA:  Patient with discoloration about the fifth toe. EXAM: DG TOE 5TH LEFT COMPARISON:  Foot radiograph 05/27/2011. FINDINGS: Oblique view demonstrates distal amputation of the second digit. There is soft tissue swelling about the fifth digit. No definite evidence for acute displaced fracture or cortical osseous destruction. IMPRESSION: Soft tissue swelling about the fifth digit. No definite evidence for underlying fracture or osseous destruction. Electronically Signed   By: Annia Belt M.D.   On: 03/13/2015 18:35     ASSESSMENT AND PLAN:   Principal Problem:   Diabetic foot infection (HCC) Active Problems:   Essential hypertension   Diabetes mellitus, type 2 (HCC)   Sepsis secondary to diabetic foot infection   Tobacco abuse   Hyponatremia   1. sepsis secondary to diabetic foot infection: Antibiotics were changed to meropenem and vancomycin. Patient WBC is improving.blood cultures no growth. Seen by podiatry,scheduled for amputation of left little toe tomorrow.MRI is negative for osteomyelitis. 2 right labial abscess;continue warm compressions,iv abx,no indication for drainage,seen by GYN. 3. diabetes mellitus type 2: Continue insulin with coverage, NovoLog 70 /30  40 units twice a day, #4 hypertension controlled. #5 hyperlipidemia continue statins. #6 hyponatremia due to dehydration: improving  All the records are reviewed and case discussed with Care Management/Social Workerr. Management plans discussed with the patient, family and they are in agreement.  CODE STATUS: Full  TOTAL  TIME TAKING CARE OF THIS PATIENT: 35 minutes.   POSSIBLE D/C IN 2-3 DAYS, DEPENDING ON CLINICAL CONDITION.   Katha HammingKONIDENA,Letecia Arps M.D on 03/15/2015 at 4:29 PM  Between 7am to 6pm - Pager - [phone]  After 6pm go to [email or web address] - password [email or web address] [email or web address]  [facility]  Office  [phone]  CC: Primary care physician; Lorie PhenixNancy  Maloney, MD

## 2015-03-15 NOTE — Consult Note (Signed)
GYNECOLOGY CONSULT NOTE  Subjective: Patient reports no major complaints.     Objective: I have reviewed patient's vital signs, intake and output, medications and labs.    Physical Exam:  Blood pressure 132/62, pulse 76, temperature 99.2 F (37.3 C), temperature source Oral, resp. rate 12, height 5\' 9"  (1.753 m), weight 265 lb 8 oz (120.43 kg), SpO2 97 %.  General: alert and no distress GI: soft, non-tender; bowel sounds normal; no masses,  no organomegaly Vulva: Right abscess noted, Same size and surrounding tissue noted .    Results for orders placed or performed during the hospital encounter of 03/13/15  Blood Culture (routine x 2)  Result Value Ref Range   Specimen Description BLOOD LEFT ASSIST CONTROL    Special Requests BOTTLES DRAWN AEROBIC AND ANAEROBIC 7ML    Culture NO GROWTH 2 DAYS    Report Status PENDING   Blood Culture (routine x 2)  Result Value Ref Range   Specimen Description BLOOD RIGHT ANTECUBITAL    Special Requests BOTTLES DRAWN AEROBIC AND ANAEROBIC 5ML    Culture NO GROWTH 1 DAY    Report Status PENDING   Urine culture  Result Value Ref Range   Specimen Description URINE, RANDOM    Special Requests NONE    Culture INSIGNIFICANT GROWTH    Report Status 03/15/2015 FINAL   Wound culture  Result Value Ref Range   Specimen Description ABSCESS    Special Requests Immunocompromised    Gram Stain FEW WBC SEEN FEW GRAM POSITIVE COCCI     Culture      MODERATE GROWTH GROUP B STREP(S.AGALACTIAE)ISOLATED Virtually 100% of S. agalactiae (Group B) strains are susceptible to Penicillin.  For Penicillin-allergic patients, Erythromycin (85-95% sensitive) and Clindamycin (80% sensitive) are drugs of choice. Contact microbiology lab to request sensitivities if  needed within 7 days.    Report Status PENDING   CBC with Differential  Result Value Ref Range   WBC 20.1 (H) 3.6 - 11.0 K/uL   RBC 4.86 3.80 - 5.20 MIL/uL   Hemoglobin 14.4 12.0 - 16.0 g/dL   HCT 16.143.3  09.635.0 - 04.547.0 %   MCV 89.2 80.0 - 100.0 fL   MCH 29.6 26.0 - 34.0 pg   MCHC 33.2 32.0 - 36.0 g/dL   RDW 40.913.0 81.111.5 - 91.414.5 %   Platelets 245 150 - 440 K/uL   Neutrophils Relative % 83 %   Neutro Abs 16.7 (H) 1.4 - 6.5 K/uL   Lymphocytes Relative 9 %   Lymphs Abs 1.8 1.0 - 3.6 K/uL   Monocytes Relative 6 %   Monocytes Absolute 1.2 (H) 0.2 - 0.9 K/uL   Eosinophils Relative 1 %   Eosinophils Absolute 0.3 0 - 0.7 K/uL   Basophils Relative 1 %   Basophils Absolute 0.2 (H) 0 - 0.1 K/uL  Comprehensive metabolic panel  Result Value Ref Range   Sodium 131 (L) 135 - 145 mmol/L   Potassium 4.4 3.5 - 5.1 mmol/L   Chloride 98 (L) 101 - 111 mmol/L   CO2 23 22 - 32 mmol/L   Glucose, Bld 237 (H) 65 - 99 mg/dL   BUN 14 6 - 20 mg/dL   Creatinine, Ser 7.820.87 0.44 - 1.00 mg/dL   Calcium 8.8 (L) 8.9 - 10.3 mg/dL   Total Protein 7.7 6.5 - 8.1 g/dL   Albumin 3.4 (L) 3.5 - 5.0 g/dL   AST 13 (L) 15 - 41 U/L   ALT 15 14 - 54 U/L  Alkaline Phosphatase 81 38 - 126 U/L   Total Bilirubin 0.8 0.3 - 1.2 mg/dL   GFR calc non Af Amer >60 >60 mL/min   GFR calc Af Amer >60 >60 mL/min   Anion gap 10 5 - 15  Lactic acid, plasma  Result Value Ref Range   Lactic Acid, Venous 1.8 0.5 - 2.0 mmol/L  Urinalysis complete, with microscopic ([facility] only)  Result Value Ref Range   Color, Urine AMBER (A) YELLOW   APPearance HAZY (A) CLEAR   Glucose, UA >500 (A) NEGATIVE mg/dL   Bilirubin Urine NEGATIVE NEGATIVE   Ketones, ur TRACE (A) NEGATIVE mg/dL   Specific Gravity, Urine 1.022 1.005 - 1.030   Hgb urine dipstick 1+ (A) NEGATIVE   pH 5.0 5.0 - 8.0   Protein, ur 100 (A) NEGATIVE mg/dL   Nitrite NEGATIVE NEGATIVE   Leukocytes, UA NEGATIVE NEGATIVE   RBC / HPF 0-5 0 - 5 RBC/hpf   WBC, UA 6-30 0 - 5 WBC/hpf   Bacteria, UA RARE (A) NONE SEEN   Squamous Epithelial / LPF 0-5 (A) NONE SEEN   Mucous PRESENT    Hyaline Casts, UA PRESENT   Protime-INR  Result Value Ref Range   Prothrombin Time 15.5 (H) 11.4 - 15.0 seconds    INR 1.21   CBC  Result Value Ref Range   WBC 18.3 (H) 3.6 - 11.0 K/uL   RBC 4.04 3.80 - 5.20 MIL/uL   Hemoglobin 11.9 (L) 12.0 - 16.0 g/dL   HCT 16.1 09.6 - 04.5 %   MCV 90.1 80.0 - 100.0 fL   MCH 29.5 26.0 - 34.0 pg   MCHC 32.7 32.0 - 36.0 g/dL   RDW 40.9 81.1 - 91.4 %   Platelets 215 150 - 440 K/uL  Creatinine, serum  Result Value Ref Range   Creatinine, Ser 0.78 0.44 - 1.00 mg/dL   GFR calc non Af Amer >60 >60 mL/min   GFR calc Af Amer >60 >60 mL/min  Hemoglobin A1c  Result Value Ref Range   Hgb A1c MFr Bld 11.6 (H) 4.0 - 6.0 %  CBC  Result Value Ref Range   WBC 14.6 (H) 3.6 - 11.0 K/uL   RBC 3.90 3.80 - 5.20 MIL/uL   Hemoglobin 11.8 (L) 12.0 - 16.0 g/dL   HCT 78.2 95.6 - 21.3 %   MCV 91.4 80.0 - 100.0 fL   MCH 30.2 26.0 - 34.0 pg   MCHC 33.0 32.0 - 36.0 g/dL   RDW 08.6 57.8 - 46.9 %   Platelets 195 150 - 440 K/uL  Basic metabolic panel  Result Value Ref Range   Sodium 133 (L) 135 - 145 mmol/L   Potassium 3.9 3.5 - 5.1 mmol/L   Chloride 104 101 - 111 mmol/L   CO2 24 22 - 32 mmol/L   Glucose, Bld 281 (H) 65 - 99 mg/dL   BUN 20 6 - 20 mg/dL   Creatinine, Ser 6.29 0.44 - 1.00 mg/dL   Calcium 7.5 (L) 8.9 - 10.3 mg/dL   GFR calc non Af Amer >60 >60 mL/min   GFR calc Af Amer >60 >60 mL/min   Anion gap 5 5 - 15  Glucose, capillary  Result Value Ref Range   Glucose-Capillary 254 (H) 65 - 99 mg/dL  Glucose, capillary  Result Value Ref Range   Glucose-Capillary 193 (H) 65 - 99 mg/dL  Glucose, capillary  Result Value Ref Range   Glucose-Capillary 165 (H) 65 - 99 mg/dL  Glucose,  capillary  Result Value Ref Range   Glucose-Capillary 111 (H) 65 - 99 mg/dL   Comment 1 Notify RN   Glucose, capillary  Result Value Ref Range   Glucose-Capillary 90 65 - 99 mg/dL   Comment 1 Notify RN   Glucose, capillary  Result Value Ref Range   Glucose-Capillary 137 (H) 65 - 99 mg/dL  Glucose, capillary  Result Value Ref Range   Glucose-Capillary 159 (H) 65 - 99 mg/dL   Comment  1 Notify RN   Glucose, capillary  Result Value Ref Range   Glucose-Capillary 96 65 - 99 mg/dL     Assessment/Plan: Right vulvar abscess - will continue to monitor, does not require drainage.  To continue Sitz baths.  If drainage with you , fevers, chills, noted, ou will be escorted.   Will check on symptoms  Within theweek    LOS: 2 days    Hildred Laser 03/15/2015, 10:11 AM

## 2015-03-15 NOTE — Progress Notes (Signed)
Pharmacy Antibiotic Note  Nancy [name] is a 50 y.o. female admitted on 03/13/2015 with cellulitis/sepsis.  Pharmacy was originally consulted on for vancomycin, metronidazole, and aztreonam dosing. After talking to Dr. Luberta MutterKonidena patient was changed to meropenem and vancomycin for diabetic foot infection.   Plan: DW 88kg  Vd 61.6L   Ke: 0.093 hr-1  T1/2 7 hours  Goal Trough: 15-20  Will change patients dose to Vancomycin 1500mg  q12 hours. Estimated trough at Css is 15.7.  Level before 5th dose.   3/8:  Vanc trough @ 17:00 = 12 mcg/mL         Will increase dose to Vancomycin 2000 gm IV Q12H to start 3/9 @ 6:00.         Will draw next trough before 3rd new dose on 3/10 @ 5:30.   Height: 5\' 9"  (175.3 cm) Weight: 265 lb 8 oz (120.43 kg) IBW/kg (Calculated) : 66.2  Temp (24hrs), Avg:99.8 F (37.7 C), Min:98.3 F (36.8 C), Max:101.3 F (38.5 C)   Recent Labs Lab 03/13/15 1709 03/13/15 2038 03/14/15 0259 03/14/15 0537 03/15/15 1708  WBC 20.1*  --  18.3* 14.6*  --   CREATININE 0.87  --  0.78 0.89  --   LATICACIDVEN  --  1.8  --   --   --   VANCOTROUGH  --   --   --   --  12    Estimated Creatinine Clearance: 104.9 mL/min (by C-G formula based on Cr of 0.89).    Allergies  Allergen Reactions  . Shellfish Allergy Anaphylaxis  . Morphine And Related Other (See Comments)    Reaction:  Severe migraines   . Percocet [Oxycodone-Acetaminophen] Nausea And Vomiting  . Penicillins Rash and Other (See Comments)    Has patient had a PCN reaction causing immediate rash, facial/tongue/throat swelling, SOB or lightheadedness with hypotension: No Has patient had a PCN reaction causing severe rash involving mucus membranes or skin necrosis: No Has patient had a PCN reaction that required hospitalization No Has patient had a PCN reaction occurring within the last 10 years: No If all of the above answers are "NO", then may proceed with Cephalosporin use.  . Sulfa Antibiotics Rash  .  Theophyllines Itching and Rash    Antimicrobials this admission:  Aztreonam 2gm >>3/6-3/7    Flagyl 500mg >> 3/6-3/7 Vancomycin >> 3/6-  Meropenem >> 3/7-   Microbiology results: 3/6 BCx: pending 3/7 UCx: pending   3/7 UA: LE(-) NO2 (-) WBC 6-30  Thank you for allowing pharmacy to be a part of this patient's care.  Cher NakaiSheema Hallaji, PharmD Pharmacy Resident  03/15/2015 10:29 PM

## 2015-03-16 ENCOUNTER — Inpatient Hospital Stay: Payer: Commercial Managed Care - HMO | Admitting: Anesthesiology

## 2015-03-16 ENCOUNTER — Encounter: Payer: Self-pay | Admitting: *Deleted

## 2015-03-16 ENCOUNTER — Encounter
Admission: EM | Disposition: A | Discharge status: Home / Self Care | Payer: Self-pay | Source: Home / Self Care | Attending: Internal Medicine

## 2015-03-16 HISTORY — PX: AMPUTATION TOE: SHX6595

## 2015-03-16 LAB — CREATININE, SERUM
Creatinine, Ser: 0.64 mg/dL (ref 0.44–1.00)
GFR calc Af Amer: >60 mL/min (ref >60)
GFR calc non Af Amer: >60 mL/min (ref >60)

## 2015-03-16 LAB — GLUCOSE, CAPILLARY
GLUCOSE-CAPILLARY: 122 mg/dL — AB (ref 65–99)
GLUCOSE-CAPILLARY: 178 mg/dL — AB (ref 65–99)
GLUCOSE-CAPILLARY: 114 mg/dL — AB (ref 65–99)
Glucose-Capillary: 116 mg/dL — ABNORMAL HIGH (ref 65–99)

## 2015-03-16 LAB — POCT PREGNANCY, URINE: PREG TEST UR: NEGATIVE

## 2015-03-16 SURGERY — AMPUTATION, TOE
Anesthesia: General | Laterality: Left

## 2015-03-16 MED ORDER — LIDOCAINE HCL (PF) 1 % IJ SOLN
INTRAMUSCULAR | Status: DC | PRN
  Administered 2015-03-16: 5 mL

## 2015-03-16 MED ORDER — PROPOFOL 500 MG/50ML IV EMUL
INTRAVENOUS | Status: DC | PRN
  Administered 2015-03-16: 150 ug/kg/min via INTRAVENOUS

## 2015-03-16 MED ORDER — OXYCODONE HCL 5 MG/5ML PO SOLN: 5.0000 mg | Freq: Once | ORAL | Status: DC | PRN

## 2015-03-16 MED ORDER — LIDOCAINE HCL (PF) 1 % IJ SOLN
INTRAMUSCULAR | Status: AC
  Filled 2015-03-16: qty 30

## 2015-03-16 MED ORDER — VANCOMYCIN HCL 1000 MG IV SOLR
INTRAVENOUS | Status: AC
  Filled 2015-03-16: qty 1000

## 2015-03-16 MED ORDER — BUPIVACAINE HCL (PF) 0.5 % IJ SOLN
INTRAMUSCULAR | Status: AC
  Filled 2015-03-16: qty 30

## 2015-03-16 MED ORDER — KETOROLAC TROMETHAMINE 30 MG/ML IJ SOLN
INTRAMUSCULAR | Status: DC | PRN
  Administered 2015-03-16: 5 mg via INTRAVENOUS

## 2015-03-16 MED ORDER — CHLORHEXIDINE GLUCONATE 4 % EX LIQD
60.0000 mL | Freq: Once | CUTANEOUS | Status: AC
  Administered 2015-03-16: 4 via TOPICAL

## 2015-03-16 MED ORDER — CLINDAMYCIN PHOSPHATE 900 MG/50ML IV SOLN
900.0000 mg | INTRAVENOUS | Status: DC
  Filled 2015-03-16: qty 50

## 2015-03-16 MED ORDER — FENTANYL CITRATE (PF) 100 MCG/2ML IJ SOLN
25.0000 ug | INTRAMUSCULAR | Status: DC | PRN
  Administered 2015-03-16 (×2): 50 ug via INTRAVENOUS

## 2015-03-16 MED ORDER — OXYCODONE HCL 5 MG PO TABS: 5.0000 mg | ORAL_TABLET | Freq: Once | ORAL | Status: DC | PRN

## 2015-03-16 MED ORDER — BUPIVACAINE HCL 0.5 % IJ SOLN
INTRAMUSCULAR | Status: DC | PRN
  Administered 2015-03-16: 5 mL

## 2015-03-16 MED ORDER — PROPOFOL 10 MG/ML IV BOLUS
INTRAVENOUS | Status: DC | PRN
Start: 2015-03-16 — End: 2015-03-16
  Administered 2015-03-16: 30 mg via INTRAVENOUS
  Administered 2015-03-16: 40 mg via INTRAVENOUS

## 2015-03-16 MED ORDER — SODIUM CHLORIDE 0.9 % IV SOLN
INTRAVENOUS | Status: DC
  Administered 2015-03-16 – 2015-03-17 (×3): via INTRAVENOUS

## 2015-03-16 MED ORDER — FENTANYL CITRATE (PF) 100 MCG/2ML IJ SOLN
INTRAMUSCULAR | Status: AC
  Filled 2015-03-16: qty 2

## 2015-03-16 MED ORDER — FENTANYL CITRATE (PF) 100 MCG/2ML IJ SOLN
INTRAMUSCULAR | Status: DC | PRN
  Administered 2015-03-16: 50 ug via INTRAVENOUS

## 2015-03-16 MED ORDER — CLINDAMYCIN PHOSPHATE 900 MG/50ML IV SOLN
INTRAVENOUS | Status: AC
  Administered 2015-03-16: 900 mg via INTRAVENOUS
  Filled 2015-03-16: qty 50

## 2015-03-16 MED ORDER — LIDOCAINE HCL (CARDIAC) 20 MG/ML IV SOLN
INTRAVENOUS | Status: DC | PRN
  Administered 2015-03-16: 40 mg via INTRAVENOUS

## 2015-03-16 MED ORDER — EPHEDRINE SULFATE 50 MG/ML IJ SOLN: INTRAMUSCULAR | Status: DC | PRN

## 2015-03-16 MED ORDER — MIDAZOLAM HCL 2 MG/2ML IJ SOLN
INTRAMUSCULAR | Status: DC | PRN
  Administered 2015-03-16: 2 mg via INTRAVENOUS

## 2015-03-16 SURGICAL SUPPLY — 45 items
BANDAGE ELASTIC 4 LF NS (GAUZE/BANDAGES/DRESSINGS) ×2 IMPLANT
BANDAGE STRETCH 3X4.1 STRL (GAUZE/BANDAGES/DRESSINGS) ×4 IMPLANT
BLADE OSC/SAGITTAL MD 5.5X18 (BLADE) ×2 IMPLANT
BLADE SURG MINI STRL (BLADE) ×2 IMPLANT
BNDG CMPR 75X21 PLY HI ABS (MISCELLANEOUS) ×1
BNDG CMPR MED 5X4 ELC HKLP NS (GAUZE/BANDAGES/DRESSINGS) ×1
BNDG ESMARK 4X12 TAN STRL LF (GAUZE/BANDAGES/DRESSINGS) ×2 IMPLANT
BNDG GAUZE 4.5X4.1 6PLY STRL (MISCELLANEOUS) ×2 IMPLANT
CANISTER SUCT 1200ML W/VALVE (MISCELLANEOUS) ×2 IMPLANT
CUFF TOURN SGL QUICK 18 (TOURNIQUET CUFF) ×1 IMPLANT
DRAPE FLUOR MINI C-ARM 54X84 (DRAPES) ×2 IMPLANT
DRAPE XRAY CASSETTE 23X24 (DRAPES) ×2 IMPLANT
DURAPREP 26ML APPLICATOR (WOUND CARE) ×2 IMPLANT
ELECT REM PT RETURN 9FT ADLT (ELECTROSURGICAL) ×2
ELECTRODE REM PT RTRN 9FT ADLT (ELECTROSURGICAL) ×1 IMPLANT
GAUZE IODOFORM PACK 1/2 7832 (GAUZE/BANDAGES/DRESSINGS) ×2 IMPLANT
GAUZE PACKING IODOFORM 1X5 (MISCELLANEOUS) ×1 IMPLANT
GAUZE PETRO XEROFOAM 1X8 (MISCELLANEOUS) ×2 IMPLANT
GAUZE SPONGE 4X4 12PLY STRL (GAUZE/BANDAGES/DRESSINGS) ×2 IMPLANT
GAUZE STRETCH 2X75IN STRL (MISCELLANEOUS) ×2 IMPLANT
GLOVE BIO SURGEON STRL SZ7.5 (GLOVE) ×2 IMPLANT
GLOVE INDICATOR 8.0 STRL GRN (GLOVE) ×2 IMPLANT
GOWN STRL REUS W/ TWL LRG LVL3 (GOWN DISPOSABLE) ×2 IMPLANT
GOWN STRL REUS W/TWL LRG LVL3 (GOWN DISPOSABLE) ×4
HANDPIECE SUCTION TUBG SURGILV (MISCELLANEOUS) ×2 IMPLANT
KIT RM TURNOVER STRD PROC AR (KITS) ×2 IMPLANT
LABEL OR SOLS (LABEL) ×2 IMPLANT
NDL FILTER BLUNT 18X1 1/2 (NEEDLE) ×1 IMPLANT
NDL HYPO 25X1 1.5 SAFETY (NEEDLE) ×1 IMPLANT
NEEDLE FILTER BLUNT 18X 1/2SAF (NEEDLE) ×1
NEEDLE FILTER BLUNT 18X1 1/2 (NEEDLE) ×1 IMPLANT
NEEDLE HYPO 25X1 1.5 SAFETY (NEEDLE) ×2 IMPLANT
NS IRRIG 500ML POUR BTL (IV SOLUTION) ×2 IMPLANT
PACK EXTREMITY ARMC (MISCELLANEOUS) ×2 IMPLANT
PAD ABD DERMACEA PRESS 5X9 (GAUZE/BANDAGES/DRESSINGS) ×4 IMPLANT
SOL .9 NS 3000ML IRR  AL (IV SOLUTION) ×1
SOL .9 NS 3000ML IRR AL (IV SOLUTION) ×1
SOL .9 NS 3000ML IRR UROMATIC (IV SOLUTION) ×1 IMPLANT
STOCKINETTE M/LG 89821 (MISCELLANEOUS) ×2 IMPLANT
STRAP SAFETY BODY (MISCELLANEOUS) ×2 IMPLANT
SUT ETHILON 3-0 FS-10 30 BLK (SUTURE) ×2
SUT ETHILON 5-0 FS-2 18 BLK (SUTURE) ×2 IMPLANT
SUT VIC AB 4-0 FS2 27 (SUTURE) ×2 IMPLANT
SUTURE EHLN 3-0 FS-10 30 BLK (SUTURE) ×1 IMPLANT
SYRINGE 10CC LL (SYRINGE) ×6 IMPLANT

## 2015-03-16 NOTE — Anesthesia Postprocedure Evaluation (Signed)
Anesthesia Post Note  Patient: Nancy [name]  Procedure(s) Performed: Procedure(s) (LRB): left fifthe toe amputation with incision and drainage (Left)  Patient location during evaluation: PACU Anesthesia Type: General Level of consciousness: awake and alert Pain management: pain level controlled Vital Signs Assessment: post-procedure vital signs reviewed and stable Respiratory status: spontaneous breathing, nonlabored ventilation, respiratory function stable and patient connected to nasal cannula oxygen Cardiovascular status: blood pressure returned to baseline and stable Postop Assessment: no signs of nausea or vomiting Anesthetic complications: no    Last Vitals:  Filed Vitals:   03/16/15 1429 03/16/15 1505  BP: 124/64 138/84  Pulse: 68 70  Temp: 36.7 C 36.9 C  Resp: 18 18    Last Pain:  Filed Vitals:   03/16/15 1505  PainSc: 0-No pain                 Cleda MccreedyJoseph K Shenoa Hattabaugh

## 2015-03-16 NOTE — Anesthesia Preprocedure Evaluation (Addendum)
Anesthesia Evaluation  Patient identified by MRN, date of birth, ID band Patient awake    Reviewed: Allergy & Precautions, H&P , NPO status , Patient's Chart, lab work & pertinent test results  History of Anesthesia Complications (+) PONV and history of anesthetic complications  Airway Mallampati: III  TM Distance: >3 FB Neck ROM: limited    Dental  (+) Poor Dentition, Chipped   Pulmonary shortness of breath and with exertion, asthma , Current Smoker,    Pulmonary exam normal breath sounds clear to auscultation       Cardiovascular Exercise Tolerance: Poor hypertension, (-) angina+ CAD, +CHF and + DOE  Normal cardiovascular examII Rhythm:regular Rate:Normal     Neuro/Psych  Headaches, PSYCHIATRIC DISORDERS Anxiety Depression  Neuromuscular disease    GI/Hepatic Neg liver ROS, GERD  Controlled,  Endo/Other  diabetes, Poorly Controlled, Type 2, Insulin Dependent  Renal/GU negative Renal ROS  negative genitourinary   Musculoskeletal  (+) Arthritis ,   Abdominal   Peds  Hematology negative hematology ROS (+)   Anesthesia Other Findings Past Medical History:   Diabetes mellitus                                            Eczema                                                       Arthritis                                                      Comment:r knee   Anxiety                                                        Comment:panic attacks   Asthma                                                         Comment: tx er 08/2010   Unspecified essential hypertension                             Comment:FOLLOWED BY  [facility].PREVIOUSLY SEEN               BY [place].ECHO 2008   Atherosclerosis of coronary artery              05/10/2014     [facility] (congestive cardiac failure) (HCC)          06/06/2008      Comment:30% EF    Acid reflux  05/10/2014     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/2014    Past Surgical History:   CHOLECYSTECTOMY                                               kidney stone removal                                          TONSILLECTOMY                                                 CARDIAC CATHETERIZATION                                         Comment:2011 followed by dr bensimohn   KNEE ARTHROSCOPY W/ MENISCAL REPAIR                           TOTAL KNEE ARTHROPLASTY                          01/11/2011       Comment:Procedure: TOTAL KNEE ARTHROPLASTY;  Surgeon:               Harvie JuniorJohn L Graves;  Location: [facility] OR;  Service:               Orthopedics;  Laterality: Right;  COMPUTER               ASSISTED TOTAL KNEE REPLACEMENT   NOSE SURGERY                                     1991           Comment:due to fracture   TOE AMPUTATION                                                  Comment:second toe  BMI    Body Mass Index   39.11 kg/m 2    Patient reports no problems with oxycodone  Reproductive/Obstetrics negative OB ROS                            Anesthesia Physical Anesthesia Plan  ASA: IV  Anesthesia Plan: General   Post-op Pain Management:    Induction:   Airway Management Planned:   Additional Equipment:   Intra-op Plan:   Post-operative Plan:   Informed Consent: I have reviewed the patients History and Physical, chart, labs and discussed the procedure including the risks, benefits and alternatives for the proposed anesthesia with the patient or authorized representative who has indicated his/her understanding  and acceptance.   Dental Advisory Given  Plan Discussed with: Anesthesiologist, CRNA and Surgeon  Anesthesia Plan Comments:         Anesthesia Quick Evaluation

## 2015-03-16 NOTE — Transfer of Care (Signed)
Immediate Anesthesia Transfer of Care Note  Patient: Nancy [name]  Procedure(s) Performed: Procedure(s): left fifthe toe amputation with incision and drainage (Left)  Patient Location: PACU  Anesthesia Type:MAC  Level of Consciousness: sedated  Airway & Oxygen Therapy: Patient Spontanous Breathing and Patient connected to face mask oxygen  Post-op Assessment: Report given to RN and Post -op Vital signs reviewed and stable  Post vital signs: Reviewed  Last Vitals:  Filed Vitals:   03/16/15 0745 03/16/15 1204  BP: 141/69 182/94  Pulse: 74 85  Temp: 37.4 C 38.7 C  Resp: 17 17    Complications: No apparent anesthesia complications

## 2015-03-16 NOTE — Progress Notes (Signed)
[facility] [place] at [facility]   PATIENT NAME: Nancy FormMalynda Filley    MR#:  [id number]  DATE OF BIRTH:  04-May-1965  SUBJECTIVE: 50 year old female patient with history of for diabetes mellitus, hypertension, Coronmary artery disease admitted because of infection in the left little toe. Noted to have redness swelling now changed to black discoloration of the left little toe. Patient has hammertoe and was about to get the amputation done by Dr. Myrle ShengFowleron April 9. She says she feels little better today. Has right leg labial abscess which is a recurrent problem and supposed to see Dr. Hildred LaserAnika Cherry from GYN, today but because she is admitted, she wants a GYN to see her for possible abscess drainage of right labial abscess.. Seen today,says her labial abcess pain is better ,and her left foot is wrapped,   CHIEF COMPLAINT:   Chief Complaint  Patient presents with  . Toe Injury   patient is for left fifth toe amputation today, denies any significant discomfort, pain , fevers  REVIEW OF SYSTEMS:    Review of Systems  Constitutional: Negative for fever and chills.  HENT: Negative for hearing loss.   Eyes: Negative for blurred vision, double vision and photophobia.  Respiratory: Negative for cough, hemoptysis and shortness of breath.   Cardiovascular: Negative for chest pain, palpitations, orthopnea and leg swelling.  Gastrointestinal: Negative for vomiting, abdominal pain and diarrhea.  Genitourinary: Negative for dysuria and urgency.       Right labial area swollen, lesstender to palpation.  Musculoskeletal: Negative for myalgias and neck pain.       Dressing  Present on left foot.,  Skin: Negative for rash.  Neurological: Negative for dizziness, focal weakness, seizures, weakness and headaches.  Psychiatric/Behavioral: Negative for memory loss. The patient does not have insomnia.     Nutrition:  Tolerating Diet: Tolerating PT:      DRUG ALLERGIES:    Allergies  Allergen Reactions  . Shellfish Allergy Anaphylaxis  . Morphine And Related Other (See Comments)    Reaction:  Severe migraines   . Percocet [Oxycodone-Acetaminophen] Nausea And Vomiting  . Penicillins Rash and Other (See Comments)    Has patient had a PCN reaction causing immediate rash, facial/tongue/throat swelling, SOB or lightheadedness with hypotension: No Has patient had a PCN reaction causing severe rash involving mucus membranes or skin necrosis: No Has patient had a PCN reaction that required hospitalization No Has patient had a PCN reaction occurring within the last 10 years: No If all of the above answers are "NO", then may proceed with Cephalosporin use.  . Sulfa Antibiotics Rash  . Theophyllines Itching and Rash    VITALS:  Blood pressure 182/94, pulse 85, temperature 101.7 F (38.7 C), temperature source Tympanic, resp. rate 17, height 5\' 9"  (1.753 m), weight 120.203 kg (265 lb), SpO2 99 %.  PHYSICAL EXAMINATION:   Physical Exam  GENERAL:  50 y.o.-year-old patient lying in the bed with no acute distress.  EYES: Pupils equal, round, reactive to light and accommodation. No scleral icterus. Extraocular muscles intact.  HEENT: Head atraumatic, normocephalic. Oropharynx and nasopharynx clear.  NECK:  Supple, no jugular venous distention. No thyroid enlargement, no tenderness.  LUNGS: Normal breath sounds bilaterally, no wheezing, rales,rhonchi or crepitation. No use of accessory muscles of respiration.  CARDIOVASCULAR: S1, S2 normal. No murmurs, rubs, or gallops.  ABDOMEN: Soft, nontender, nondistended. Bowel sounds present. No organomegaly or mass.  EXTREMITIES: dressing present on left foot, erythema and increased warmth, pain on palpation  extending from lower 2 mid cough up.  NEUROLOGIC: Cranial nerves II through XII are intact. Muscle strength 5/5 in all extremities. Sensation intact. Gait not checked.  PSYCHIATRIC: The patient is alert and oriented x 3.   SKIN; left labial  area swollen and tender to palpation, some fluctuation present.   LABORATORY PANEL:   CBC  Recent Labs Lab 03/14/15 0537  WBC 14.6*  HGB 11.8*  HCT 35.6  PLT 195   ------------------------------------------------------------------------------------------------------------------  Chemistries   Recent Labs Lab 03/13/15 1709  03/14/15 0537 03/16/15 1051  NA 131*  --  133*  --   K 4.4  --  3.9  --   CL 98*  --  104  --   CO2 23  --  24  --   GLUCOSE 237*  --  281*  --   BUN 14  --  20  --   CREATININE 0.87  < > 0.89 0.64  CALCIUM 8.8*  --  7.5*  --   AST 13*  --   --   --   ALT 15  --   --   --   ALKPHOS 81  --   --   --   BILITOT 0.8  --   --   --   < > = values in this interval not displayed. ------------------------------------------------------------------------------------------------------------------  Cardiac Enzymes No results for input(s): TROPONINI in the last 168 hours. ------------------------------------------------------------------------------------------------------------------  RADIOLOGY:  Mr Foot Left W Wo Contrast  03/15/2015  CLINICAL DATA:  Diabetic full ulcer, infection of the left fifth toe EXAM: MRI OF THE LEFT FOREFOOT WITHOUT AND WITH CONTRAST TECHNIQUE: Multiplanar, multisequence MR imaging was performed both before and after administration of intravenous contrast. CONTRAST:  20mL MULTIHANCE GADOBENATE DIMEGLUMINE 529 MG/ML IV SOLN COMPARISON:  None. FINDINGS: Soft tissue swelling of he left fifth digit. No focal fluid collection or hematoma. No focal marrow signal abnormality. No bone destruction or periosteal reaction. No joint effusion. There is hindfoot valgus. There is subcortical reactive marrow edema involving the lateral process of the talus and calcaneus. Mild osteoarthritis of the calcaneocuboid joint. Mild osteoarthritis of the navicular-medial cuneiform articulation. Mild osteoarthritis of the first MTP joint.  Partial-thickness cartilage loss of the posterior tibiotalar joint with subchondral reactive marrow changes. Intact flexor and extensor compartment tendons. IMPRESSION: 1. No evidence of osteomyelitis of the left foot. Electronically Signed   By: Elige Ko   On: 03/15/2015 13:13     ASSESSMENT AND PLAN:   Principal Problem:   Diabetic foot infection (HCC) Active Problems:   Essential hypertension   Diabetes mellitus, type 2 (HCC)   Sepsis secondary to diabetic foot infection   Tobacco abuse   Hyponatremia   1. sepsis secondary to diabetic foot infection: Continue meropenem and vancomycin. Follow WBC in the morning.blood cultures no growth. Wound culture revealed moderate growth of group B streptococcus Seen by podiatry,scheduled for amputation of left little toe today.MRI is negative for osteomyelitis. 2 right labial abscess;continue warm compressions,iv abx,no indication for drainage,seen by GYN. 3. diabetes mellitus type 2: Continue insulin with coverage, NovoLog 70 /30  40 units twice a day, blood glucose is well controlled, ranging between 90s to 160s #4 . Malignant essential hypertension, poorly controlled, partly due to stress, IV fluids, continue blood pressure medications, decrease IV fluids to minimal . #5 hyperlipidemia continue statins. #6 hyponatremia due to dehydration: Follow in the morning. Ex #7. Leukocytosis, check white blood cell counts tomorrow morning. #8 anemia, follow-up after surgery, transfuse as  needed #Tobacco abuse, discussed with patient and family for prolonged period of time, nicotine replacement therapy was offered, agreeable All the records are reviewed and case discussed with Care Management/Social Workerr. Management plans discussed with the patient, family and they are in agreement.  CODE STATUS: Full  TOTAL TIME TAKING CARE OF THIS PATIENT: 35 minutes.  Discussed with patient's family to 10 minutes, all questions answered POSSIBLE D/C IN 2-3  DAYS, DEPENDING ON CLINICAL CONDITION.   Katharina Caper M.D on 03/16/2015 at 1:12 PM  Between 7am to 6pm - Pager - [phone]  After 6pm go to [email or web address] - password [email or web address] [facility]  [place] [facility]  Office  [phone]  CC: Primary care physician; Lorie Phenix, MD

## 2015-03-16 NOTE — Op Note (Signed)
Operative note   Surgeon:Leasha Goldberger    Assistant:none    Preop diagnosis:Abscess and necrosis left 5th toe and joint    Postop diagnosis:same     Procedure:Partial left 5th ray amputation    UJW:[id number]:minimal    Anesthesia:local and IV sedation    Hemostasis:ankle tourniquet infalted to 250mmHg for 12 minutes    Specimen:left 5th toe and metatarsal     Complications:none    Operative indications:Nancy PanningMalynda S [name] is an 50 y.o. that presents today for surgical intervention.  The risks/benefits/alternatives/complications have been discussed and consent has been given.    Procedure:  Patient was brought into the OR and placed on the operating table in thesupine position. After anesthesia was obtained theleft lower extremity was prepped and draped in usual sterile fashion.  Attention directed to left 5th toe.  Longitudinal incision along 5th metatarsal.  Racket incision around 5th toe. Toe was disarticulated. At this time the marketed purulent drainage was noted and flushed away with copious amounts of irrigation. There was noted be quite a bit of extensive necrosis at the dorsal aspect of the fifth metatarsal. This was where the abscess was beginning to drain from the dorsal aspect of the foot. I was able to excise the necrotic tissue at this time. There was noted be extension along the dorsal extensor tendon and this was excised as well. The incision was taken proximally to the proximal one half of the fifth metatarsal. No further areas of infection were noted with dissection. Specifically under the lesser metatarsophalangeal joints no further areas noted. This time there was still a fair amount of tension on the distal aspect of the incision from the necrotic tissue that was excised. At this time I was able to remove the fifth metatarsal head along the surgical neck region to decompress the area and remove pressure from the skin. Final flushing with copious amounts of irrigation did occur .   The incision was then coapted with 4-0 nylon. A small central area where the previous purulent ulcerative necrotic tissue was noted was left open and packed. The proximal aspect of the incision was also packed with Nu Gauze packing. A large bulky sterile dressing was applied to the left foot.    Patient tolerated the procedure and anesthesia well.  Was transported from the OR to the PACU with all vital signs stable and vascular status intact. To be discharged per routine protocol.  Will follow up in approximately 1 week in the outpatient clinic.

## 2015-03-17 ENCOUNTER — Encounter: Payer: Self-pay | Admitting: Podiatry

## 2015-03-17 LAB — GLUCOSE, CAPILLARY
GLUCOSE-CAPILLARY: 151 mg/dL — AB (ref 65–99)
Glucose-Capillary: 103 mg/dL — ABNORMAL HIGH (ref 65–99)
Glucose-Capillary: 109 mg/dL — ABNORMAL HIGH (ref 65–99)
Glucose-Capillary: 176 mg/dL — ABNORMAL HIGH (ref 65–99)

## 2015-03-17 LAB — VANCOMYCIN, TROUGH: Vancomycin Tr: 21 ug/mL (ref 10–20)

## 2015-03-17 LAB — BASIC METABOLIC PANEL
ANION GAP: 6 (ref 5–15)
BUN: 17 mg/dL (ref 6–20)
CHLORIDE: 103 mmol/L (ref 101–111)
CO2: 28 mmol/L (ref 22–32)
CREATININE: 0.59 mg/dL (ref 0.44–1.00)
Calcium: 8.2 mg/dL — ABNORMAL LOW (ref 8.9–10.3)
GFR calc non Af Amer: >60 mL/min (ref >60)
GLUCOSE: 115 mg/dL — AB (ref 65–99)
Potassium: 3.9 mmol/L (ref 3.5–5.1)
Sodium: 137 mmol/L (ref 135–145)

## 2015-03-17 LAB — WOUND CULTURE

## 2015-03-17 LAB — CBC
HCT: 34.8 % — ABNORMAL LOW (ref 35.0–47.0)
HEMOGLOBIN: 11.6 g/dL — AB (ref 12.0–16.0)
MCH: 30.4 pg (ref 26.0–34.0)
MCHC: 33.3 g/dL (ref 32.0–36.0)
MCV: 91.3 fL (ref 80.0–100.0)
Platelets: 252 10*3/uL (ref 150–440)
RBC: 3.81 MIL/uL (ref 3.80–5.20)
RDW: 12.9 % (ref 11.5–14.5)
WBC: 8.4 10*3/uL (ref 3.6–11.0)

## 2015-03-17 MED ORDER — HYDROCODONE-ACETAMINOPHEN 5-325 MG PO TABS
1.0000 | ORAL_TABLET | ORAL | Status: DC | PRN
  Administered 2015-03-17 – 2015-03-18 (×4): 2 via ORAL
  Filled 2015-03-17 (×4): qty 2

## 2015-03-17 MED ORDER — CEPHALEXIN 500 MG PO CAPS
500.0000 mg | ORAL_CAPSULE | Freq: Three times a day (TID) | ORAL | Status: DC
  Administered 2015-03-17 – 2015-03-18 (×2): 500 mg via ORAL
  Filled 2015-03-17 (×2): qty 1

## 2015-03-17 MED ORDER — VANCOMYCIN HCL 10 G IV SOLR
2000.0000 mg | INTRAVENOUS | Status: DC
  Administered 2015-03-17: 2000 mg via INTRAVENOUS
  Filled 2015-03-17: qty 2000

## 2015-03-17 NOTE — Progress Notes (Signed)
Pharmacy Antibiotic Note  Nancy [name] is a 50 y.o. female admitted on 03/13/2015 with cellulitis/sepsis.  Pharmacy was originally consulted on for vancomycin, metronidazole, and aztreonam dosing. After talking to Dr. Luberta MutterKonidena patient was changed to meropenem and vancomycin for diabetic foot infection.  Vancomycin discontinued today (3/10)   Plan: Will continue meropenem 1g IV 3 times daily. Will recommend narrowing of therapy on rounds.   Height: 5\' 9"  (175.3 cm) Weight: 265 lb (120.203 kg) IBW/kg (Calculated) : 66.2  Temp (24hrs), Avg:98.7 F (37.1 C), Min:98.2 F (36.8 C), Max:99.8 F (37.7 C)   Recent Labs Lab 03/13/15 1709 03/13/15 2038 03/14/15 0259 03/14/15 0537 03/15/15 1708 03/16/15 1051 03/17/15 0528  WBC 20.1*  --  18.3* 14.6*  --   --  8.4  CREATININE 0.87  --  0.78 0.89  --  0.64 0.59  LATICACIDVEN  --  1.8  --   --   --   --   --   VANCOTROUGH  --   --   --   --  12  --  21*    Estimated Creatinine Clearance: 116.6 mL/min (by C-G formula based on Cr of 0.59).    Allergies  Allergen Reactions  . Shellfish Allergy Anaphylaxis  . Morphine And Related Other (See Comments)    Reaction:  Severe migraines   . Percocet [Oxycodone-Acetaminophen] Nausea And Vomiting  . Penicillins Rash and Other (See Comments)    Has patient had a PCN reaction causing immediate rash, facial/tongue/throat swelling, SOB or lightheadedness with hypotension: No Has patient had a PCN reaction causing severe rash involving mucus membranes or skin necrosis: No Has patient had a PCN reaction that required hospitalization No Has patient had a PCN reaction occurring within the last 10 years: No If all of the above answers are "NO", then may proceed with Cephalosporin use.  . Sulfa Antibiotics Rash  . Theophyllines Itching and Rash    Antimicrobials this admission:  Aztreonam 2gm >>3/6-3/7    Flagyl 500mg >> 3/6-3/7 Vancomycin >> 3/6-  Meropenem >> 3/7-   Microbiology  results: 3/6 BCx: pending 3/7 UCx: pending   3/7 UA: LE(-) NO2 (-) WBC 6-30  Thank you for allowing pharmacy to be a part of this patient's care.  Cher NakaiSheema Chardonay [place], PharmD Pharmacy Resident  03/17/2015 2:34 PM

## 2015-03-17 NOTE — Progress Notes (Signed)
[facility] - [place] at [facility]   PATIENT NAME: Nancy [name]    MR#:  [id number]  DATE OF BIRTH:  06/21/1965  SUBJECTIVE: 50 year old female patient with history of for diabetes mellitus, hypertension, Coronmary artery disease admitted because of infection in the left little toe. Noted to have redness swelling now changed to black discoloration of the left little toe. Patient has hammertoe and was about to get the amputation done by Dr. Myrle Sheng April 9. She says she feels little better today. Has right leg labial abscess which is a recurrent problem and supposed to see Dr. Hildred Laser from GYN, today but because she is admitted, she wants a GYN to see her for possible abscess drainage of right labial abscess.. Seen today,says her labial abcess pain is better ,and her left foot is wrapped,   CHIEF COMPLAINT:   Chief Complaint  Patient presents with  . Toe Injury   patient underwent left fifth toe amputation on the ninth of March 2017 by Dr. Ether Griffins due to abscess and necrosis of left fifth toe and joint,, patient feels good today, denies any significant discomfort, pain , fever. Dr. Hoyle Sauer saw patient in consultation and the found. Discussed skin is proximal skin incision sites and recommended to reassess tomorrow morning. Wound culture came back as moderate gram-positive cocci, group B Streptococcus, , now on meropenem intravenously to be changed to  Augmentin upon discharge  REVIEW OF SYSTEMS:    Review of Systems  Constitutional: Negative for fever and chills.  HENT: Negative for hearing loss.   Eyes: Negative for blurred vision, double vision and photophobia.  Respiratory: Negative for cough, hemoptysis and shortness of breath.   Cardiovascular: Negative for chest pain, palpitations, orthopnea and leg swelling.  Gastrointestinal: Negative for vomiting, abdominal pain and diarrhea.  Genitourinary: Negative for dysuria and urgency.       Right labial  area swollen, lesstender to palpation.  Musculoskeletal: Negative for myalgias and neck pain.       Dressing  Present on left foot.,  Skin: Negative for rash.  Neurological: Negative for dizziness, focal weakness, seizures, weakness and headaches.  Psychiatric/Behavioral: Negative for memory loss. The patient does not have insomnia.     Nutrition:  Tolerating Diet: Tolerating PT:      DRUG ALLERGIES:   Allergies  Allergen Reactions  . Shellfish Allergy Anaphylaxis  . Morphine And Related Other (See Comments)    Reaction:  Severe migraines   . Percocet [Oxycodone-Acetaminophen] Nausea And Vomiting  . Penicillins Rash and Other (See Comments)    Has patient had a PCN reaction causing immediate rash, facial/tongue/throat swelling, SOB or lightheadedness with hypotension: No Has patient had a PCN reaction causing severe rash involving mucus membranes or skin necrosis: No Has patient had a PCN reaction that required hospitalization No Has patient had a PCN reaction occurring within the last 10 years: No If all of the above answers are "NO", then may proceed with Cephalosporin use.  . Sulfa Antibiotics Rash  . Theophyllines Itching and Rash    VITALS:  Blood pressure 139/63, pulse 79, temperature 99.8 F (37.7 C), temperature source Oral, resp. rate 16, height  (1.753 m), weight 120.203 kg (265 lb), SpO2 95 %.  PHYSICAL EXAMINATION:   Physical Exam  GENERAL:  50 y.o.-year-old patient lying in the bed with no acute distress.  EYES: Pupils equal, round, reactive to light and accommodation. No scleral icterus. Extraocular muscles intact.  HEENT: Head atraumatic, normocephalic. Oropharynx and nasopharynx  clear.  NECK:  Supple, no jugular venous distention. No thyroid enlargement, no tenderness.  LUNGS: Normal breath sounds bilaterally, no wheezing, rales,rhonchi or crepitation. No use of accessory muscles of respiration.  CARDIOVASCULAR: S1, S2 normal. No murmurs, rubs, or  gallops.  ABDOMEN: Soft, nontender, nondistended. Bowel sounds present. No organomegaly or mass.  EXTREMITIES: dressing present on left foot, erythema and increased warmth, pain on palpation extending from lower 2 mid cough up.  NEUROLOGIC: Cranial nerves II through XII are intact. Muscle strength 5/5 in all extremities. Sensation intact. Gait not checked.  PSYCHIATRIC: The patient is alert and oriented x 3.  SKIN; left labial  area swollen and tender to palpation, some fluctuation present.   LABORATORY PANEL:   CBC  Recent Labs Lab 03/17/15 0528  WBC 8.4  HGB 11.6*  HCT 34.8*  PLT 252   ------------------------------------------------------------------------------------------------------------------  Chemistries   Recent Labs Lab 03/13/15 1709  03/17/15 0528  NA 131*  < > 137  K 4.4  < > 3.9  CL 98*  < > 103  CO2 23  < > 28  GLUCOSE 237*  < > 115*  BUN 14  < > 17  CREATININE 0.87  < > 0.59  CALCIUM 8.8*  < > 8.2*  AST 13*  --   --   ALT 15  --   --   ALKPHOS 81  --   --   BILITOT 0.8  --   --   < > = values in this interval not displayed. ------------------------------------------------------------------------------------------------------------------  Cardiac Enzymes No results for input(s): TROPONINI in the last 168 hours. ------------------------------------------------------------------------------------------------------------------  RADIOLOGY:  No results found.   ASSESSMENT AND PLAN:   Principal Problem:   Diabetic foot infection (HCC) Active Problems:   Essential hypertension   Diabetes mellitus, type 2 (HCC)   Sepsis secondary to diabetic foot infection   Tobacco abuse   Hyponatremia   1. sepsis secondary to diabetic foot infection: Continue meropenem for now, discontinue vancomycin. WBC is normal.blood cultures no growth. Wound culture revealed moderate growth of group B streptococcus, changed to Augmentin upon discharge Patient underwent  partial left fifth ray amputation MRI was negative for osteomyelitis. 2 right labial abscess;continue warm compressions,iv abx,no indication for drainage,seen by GYN. Change antibiotic to Augmentin upon discharge, follow-up with GYN as outpatient 3. diabetes mellitus type 2: Continue insulin with coverage, NovoLog 70 /30  40 units twice a day, blood glucose is well controlled, ranging between 90s to 160s #4 . Malignant essential hypertension, initially poorly controlled, partly due to stress, IV fluids, no IV fluids are discontinued, blood pressure has improved, continue blood pressure medications . #5 hyperlipidemia continue statins. #6 hyponatremia due to dehydration: Resolved with IV fluid administration #7. Leukocytosis, normalized with antibiotic therapy #8 anemia, stableafter surgery, no need to transfuse  #Tobacco abuse, discussed with patient and family yesterday, nicotine replacement therapy to be continued All the records are reviewed and case discussed with Care Management/Social Workerr. Management plans discussed with the patient, family and they are in agreement.  CODE STATUS: Full  TOTAL TIME TAKING CARE OF THIS PATIENT: 35 minutes.   POSSIBLE D/C IN 2-3 DAYS, DEPENDING ON CLINICAL CONDITION.   Katharina CaperVAICKUTE,Tisheena Maguire M.D on 03/17/2015 at 2:26 PM  Between 7am to 6pm - Pager - [phone]  After 6pm go to [email or web address] - password [email or web address] [facility]  [place] [place] [facility]  Office  [phone]  CC: Primary care physician; Lorie PhenixNancy Maloney, MD

## 2015-03-17 NOTE — Progress Notes (Signed)
Pharmacy Antibiotic Note  Junie PanningMalynda S Coppola is a 50 y.o. female admitted on 03/13/2015 with cellulitis/sepsis.  Pharmacy was originally consulted on for vancomycin, metronidazole, and aztreonam dosing. After talking to Dr. Luberta MutterKonidena patient was changed to meropenem and vancomycin for diabetic foot infection.   Plan: DW 88kg  Vd 61.6L   Ke: 0.093 hr-1  T1/2 7 hours  Goal Trough: 15-20  Will change patients dose to Vancomycin 1500mg  q12 hours. Estimated trough at Css is 15.7.  Level before 5th dose.   3/8:  Vanc trough @ 17:00 = 12 mcg/mL         Will increase dose to Vancomycin 2000 gm IV Q12H to start 3/9 @ 6:00.         Will draw next trough before 3rd new dose on 3/10 @ 5:30.   3.10 AM vanc level 21. Changed to 2 grams q 18 hours. Level before 3rd new dose.   Height: 5\' 9"  (175.3 cm) Weight: 265 lb (120.203 kg) IBW/kg (Calculated) : 66.2  Temp (24hrs), Avg:98.8 F (37.1 C), Min:97.8 F (36.6 C), Max:101.7 F (38.7 C)   Recent Labs Lab 03/13/15 1709 03/13/15 2038 03/14/15 0259 03/14/15 0537 03/15/15 1708 03/16/15 1051 03/17/15 0528  WBC 20.1*  --  18.3* 14.6*  --   --  8.4  CREATININE 0.87  --  0.78 0.89  --  0.64 0.59  LATICACIDVEN  --  1.8  --   --   --   --   --   VANCOTROUGH  --   --   --   --  12  --  21*    Estimated Creatinine Clearance: 116.6 mL/min (by C-G formula based on Cr of 0.59).    Allergies  Allergen Reactions  . Shellfish Allergy Anaphylaxis  . Morphine And Related Other (See Comments)    Reaction:  Severe migraines   . Percocet [Oxycodone-Acetaminophen] Nausea And Vomiting  . Penicillins Rash and Other (See Comments)    Has patient had a PCN reaction causing immediate rash, facial/tongue/throat swelling, SOB or lightheadedness with hypotension: No Has patient had a PCN reaction causing severe rash involving mucus membranes or skin necrosis: No Has patient had a PCN reaction that required hospitalization No Has patient had a PCN reaction occurring  within the last 10 years: No If all of the above answers are "NO", then may proceed with Cephalosporin use.  . Sulfa Antibiotics Rash  . Theophyllines Itching and Rash    Antimicrobials this admission:  Aztreonam 2gm >>3/6-3/7    Flagyl 500mg >> 3/6-3/7 Vancomycin >> 3/6-  Meropenem >> 3/7-   Microbiology results: 3/6 BCx: pending 3/7 UCx: pending   3/7 UA: LE(-) NO2 (-) WBC 6-30  Thank you for allowing pharmacy to be a part of this patient's care.  Cher NakaiSheema Hallaji, PharmD Pharmacy Resident  03/17/2015 6:21 AM

## 2015-03-17 NOTE — Progress Notes (Signed)
Daily Progress Note   Subjective  - 1 Day Post-Op  POD left 5th partial ray amputation   Objective Filed Vitals:   03/16/15 1807 03/16/15 1901 03/16/15 1950 03/17/15 0408  BP: 156/78 147/77 135/76 113/59  Pulse: 74 76 75 69  Temp: 98.2 F (36.8 C) 98.4 F (36.9 C) 98.5 F (36.9 C) 99.1 F (37.3 C)  TempSrc: Oral  Oral Oral  Resp: 18 18 20 20   Height:      Weight:      SpO2: 100% 98% 100% 99%    Physical Exam: Wound is stable with minimal drainage.  Packing pulled and no frank purulence.  Small area of dusky skin at proximal skin incision site.  ERythema to foot is stable and not increased.  Laboratory CBC    Component Value Date/Time   WBC 8.4 03/17/2015 0528   WBC 9.5 08/21/2012 1523   WBC 8.9 02/07/2012   HGB 11.6* 03/17/2015 0528   HGB 14.3 08/21/2012 1523   HCT 34.8* 03/17/2015 0528   HCT 41.3 08/21/2012 1523   PLT 252 03/17/2015 0528   PLT 212 08/21/2012 1523    BMET    Component Value Date/Time   NA 137 03/17/2015 0528   NA 133* 08/21/2012 1523   NA 136* 02/07/2012   K 3.9 03/17/2015 0528   K 4.1 08/21/2012 1523   CL 103 03/17/2015 0528   CL 102 08/21/2012 1523   CO2 28 03/17/2015 0528   CO2 26 08/21/2012 1523   GLUCOSE 115* 03/17/2015 0528   GLUCOSE 306* 08/21/2012 1523   BUN 17 03/17/2015 0528   BUN 17 08/21/2012 1523   BUN 13 02/07/2012   CREATININE 0.59 03/17/2015 0528   CREATININE 0.69 08/21/2012 1523   CREATININE 0.7 02/07/2012   CALCIUM 8.2* 03/17/2015 0528   CALCIUM 8.8 08/21/2012 1523   GFRNONAA >60 03/17/2015 0528   GFRNONAA >60 08/21/2012 1523   GFRAA >60 03/17/2015 0528   GFRAA >60 08/21/2012 1523  Wound culture 3/7 MODERATE GROWTH GROUP B STREP(S.AGALACTIAE)ISOLATED  Virtually 100% of S. agalactiae (Group B) strains are susceptible to Penicillin. For Penicillin-allergic patients, Erythromycin (85-95% sensitive) and Clindamycin (80% sensitive) are drugs of choice. Contact microbiology lab to request sensitivities if  needed  within 7 days.   Blood cx negative at this time. Assessment/Planning: Left 5th toe with necrosis and abscess   Dressing changed.  Culture with group b strep.    Will re-evaluate tomorrow and if continues to improve can likely d/c on po abx  PT has rolling knee scooter and postop shoe.    Gwyneth RevelsFowler, Drusilla Wampole A  03/17/2015, 8:32 AM

## 2015-03-18 LAB — GLUCOSE, CAPILLARY: Glucose-Capillary: 119 mg/dL — ABNORMAL HIGH (ref 65–99)

## 2015-03-18 LAB — WOUND CULTURE

## 2015-03-18 LAB — CULTURE, BLOOD (ROUTINE X 2): CULTURE: NO GROWTH

## 2015-03-18 MED ORDER — DOCUSATE SODIUM 100 MG PO CAPS: 100.0000 mg | ORAL_CAPSULE | Freq: Two times a day (BID) | ORAL | Status: DC

## 2015-03-18 MED ORDER — HYDROCODONE-ACETAMINOPHEN 5-325 MG PO TABS: 1.0000 | ORAL_TABLET | ORAL | Status: DC | PRN

## 2015-03-18 MED ORDER — CLINDAMYCIN HCL 300 MG PO CAPS: 300.0000 mg | ORAL_CAPSULE | Freq: Three times a day (TID) | ORAL | Status: DC

## 2015-03-18 NOTE — Progress Notes (Signed)
Daily Progress Note   Subjective  - 2 Days Post-Op  F/u left 5th ray amputation.  Some pain overnight but well today.  Objective Filed Vitals:   03/17/15 1542 03/17/15 1942 03/18/15 0436 03/18/15 0731  BP: 127/95 172/75 147/82 139/73  Pulse: 70 68 60 57  Temp: 98.5 F (36.9 C) 98.2 F (36.8 C) 97.9 F (36.6 C) 97.7 F (36.5 C)  TempSrc: Oral Oral Oral Oral  Resp: 16 20 18 16   Height:      Weight:      SpO2: 100% 97% 100% 96%    Physical Exam: Afebrile. Wound is healing nicely.  Erythema is decreasing to foot diffusely. Small superficial skin necrosis at proximal incision. No purulence.  Mild bloody drainage on bandage.   Laboratory CBC    Component Value Date/Time   WBC 8.4 03/17/2015 0528   WBC 9.5 08/21/2012 1523   WBC 8.9 02/07/2012   HGB 11.6* 03/17/2015 0528   HGB 14.3 08/21/2012 1523   HCT 34.8* 03/17/2015 0528   HCT 41.3 08/21/2012 1523   PLT 252 03/17/2015 0528   PLT 212 08/21/2012 1523    BMET    Component Value Date/Time   NA 137 03/17/2015 0528   NA 133* 08/21/2012 1523   NA 136* 02/07/2012   K 3.9 03/17/2015 0528   K 4.1 08/21/2012 1523   CL 103 03/17/2015 0528   CL 102 08/21/2012 1523   CO2 28 03/17/2015 0528   CO2 26 08/21/2012 1523   GLUCOSE 115* 03/17/2015 0528   GLUCOSE 306* 08/21/2012 1523   BUN 17 03/17/2015 0528   BUN 17 08/21/2012 1523   BUN 13 02/07/2012   CREATININE 0.59 03/17/2015 0528   CREATININE 0.69 08/21/2012 1523   CREATININE 0.7 02/07/2012   CALCIUM 8.2* 03/17/2015 0528   CALCIUM 8.8 08/21/2012 1523   GFRNONAA >60 03/17/2015 0528   GFRNONAA >60 08/21/2012 1523   GFRAA >60 03/17/2015 0528   GFRAA >60 08/21/2012 1523    Assessment/Planning: WBC is normal. Culture growing gourp b strep.   Pt with PCN allergy but can take Clindamycin po. Does not need IV abx. D/W family dressings and demonstrated to family.  Recommend daily to every other day dressings with dry bulky gauze. Family feels comfortable to perform  this and does not need home health. Heel WB for transfer but has rolling knee scooter for NWB mainly. Keep foot elevated. D/W pt f/u with me Wednesday am in [place] and pt will call office to schedule.   Gwyneth RevelsFowler, Rhonda Vangieson A  03/18/2015, 8:00 AM

## 2015-03-18 NOTE — Plan of Care (Signed)
Problem: Safety: Goal: Ability to remain free from injury will improve Outcome: Progressing Pt understands need for assistance while ambulating to the rest room. Pt is cognizant of safety concerns regarding her surgery.

## 2015-03-18 NOTE — Progress Notes (Signed)
Order to discharge patient to home today, discharge instructions given per MD order, home and new medications reviewed, rx.slip's given, advised to keep the dressing dry/clean/intact/ and use the post-op shoe - left foot, informed to call for follow up appt on Monday. Patient verbalized understanding of discharge instructions given. Waiting for ride home.

## 2015-03-18 NOTE — Progress Notes (Signed)
Discharge via wheelchair with nurse aid Toni Amend- Courtney.

## 2015-03-18 NOTE — Discharge Summary (Addendum)
[facility] [place] [facility]   PATIENT NAME: Nancy [name]    MR#:  [id number]  DATE OF BIRTH:  26-May-1965  DATE OF ADMISSION:  03/13/2015 ADMITTING PHYSICIAN: Altamese Dilling, MD  DATE OF DISCHARGE: No discharge date for patient encounter.  PRIMARY CARE PHYSICIAN: Lorie Phenix, MD    ADMISSION DIAGNOSIS:  Cellulitis of left lower extremity [L03.116] Sepsis, due to unspecified organism (HCC) [A41.9]  DISCHARGE DIAGNOSIS:  Principal Problem:   Diabetic foot infection (HCC) Active Problems:   Essential hypertension   Diabetes mellitus, type 2 (HCC)   Sepsis secondary to diabetic foot infection   Tobacco abuse   Hyponatremia  sepsis resolved  SECONDARY DIAGNOSIS:   Past Medical History  Diagnosis Date  . Diabetes mellitus   . Eczema   . Arthritis     r knee  . Anxiety     panic attacks  . Asthma      tx er 08/2010  . Unspecified essential hypertension     FOLLOWED BY  [facility].PREVIOUSLY SEEN BY [place].ECHO 2008  . Atherosclerosis of coronary artery 05/10/2014  . [facility] (congestive cardiac failure) (HCC) 06/06/2008    30% EF   . Acid reflux 05/10/2014  . Apical ballooning syndrome 05/10/2014    HOSPITAL COURSE:  Nancy [name]  is a 50 y.o. female admitted 03/13/2015 with chief complaint left foot injury. She originally presented to the hospital and found be meeting septic criteria secondary to diabetic foot ulcer placed on broad antibiotics. She was evaluated by podiatry and eventually had to undergo fifth toe amputation on 03/16/2015. Culture data returned group B strep antibiotics downgraded patient is doing well on current pain medication regimen reevaluated by podiatry deemed to be stable for discharge. Patient and family taught appropriate dressing changes she will follow up with podiatry as an outpatient  DISCHARGE CONDITIONS:   Stable/improved  CONSULTS OBTAINED:  Treatment Team:  Gwyneth Revels, DPM Hildred Laser, MD Suzy Bouchard, MD Wyatt Haste, MD  DRUG ALLERGIES:   Allergies  Allergen Reactions  . Shellfish Allergy Anaphylaxis  . Morphine And Related Other (See Comments)    Reaction:  Severe migraines   . Percocet [Oxycodone-Acetaminophen] Nausea And Vomiting  . Penicillins Rash and Other (See Comments)    Has patient had a PCN reaction causing immediate rash, facial/tongue/throat swelling, SOB or lightheadedness with hypotension: No Has patient had a PCN reaction causing severe rash involving mucus membranes or skin necrosis: No Has patient had a PCN reaction that required hospitalization No Has patient had a PCN reaction occurring within the last 10 years: No If all of the above answers are "NO", then may proceed with Cephalosporin use.  . Sulfa Antibiotics Rash  . Theophyllines Itching and Rash    DISCHARGE MEDICATIONS:   Current Discharge Medication List    START taking these medications   Details  clindamycin (CLEOCIN) 300 MG capsule Take 1 capsule (300 mg total) by mouth 3 (three) times daily. Qty: 15 capsule, Refills: 0    docusate sodium (COLACE) 100 MG capsule Take 1 capsule (100 mg total) by mouth 2 (two) times daily. Qty: 10 capsule, Refills: 0    HYDROcodone-acetaminophen (NORCO/VICODIN) 5-325 MG tablet Take 1-2 tablets by mouth every 4 (four) hours as needed for moderate pain. Qty: 30 tablet, Refills: 0      CONTINUE these medications which have NOT CHANGED   Details  albuterol (PROVENTIL HFA;VENTOLIN HFA) 108 (90 BASE) MCG/ACT inhaler Inhale 2 puffs into the  lungs every 4 (four) hours as needed for wheezing or shortness of breath.     aspirin EC 81 MG tablet Take 81 mg by mouth at bedtime.    aspirin-acetaminophen-caffeine (EXCEDRIN MIGRAINE) 250-250-65 MG tablet Take 2 tablets by mouth every 6 (six) hours as needed for migraine.    carvedilol (COREG) 3.125 MG tablet Take 3.125 mg by mouth 2 (two) times daily with a meal.      diazepam  (VALIUM) 10 MG tablet Take 5-10 mg by mouth every 6 (six) hours as needed for anxiety.     ibuprofen (ADVIL,MOTRIN) 800 MG tablet Take 800 mg by mouth every 8 (eight) hours as needed (for migraines).     insulin NPH-regular Human (NOVOLIN 70/30) (70-30) 100 UNIT/ML injection Inject 40 Units into the skin 2 (two) times daily.     lisinopril (PRINIVIL,ZESTRIL) 10 MG tablet Take 10 mg by mouth 2 (two) times daily.    metFORMIN (GLUCOPHAGE) 500 MG tablet Take 1,000-1,500 mg by mouth 2 (two) times daily with a meal. Pt takes two tablets with breakfast and three tablets with dinner.    pravastatin (PRAVACHOL) 40 MG tablet Take 1 tablet (40 mg total) by mouth at bedtime. Qty: 30 tablet, Refills: 5   Associated Diagnoses: Combined fat and carbohydrate induced hyperlipemia    promethazine (PHENERGAN) 25 MG tablet Take 25 mg by mouth every 6 (six) hours as needed for nausea or vomiting.     traMADol (ULTRAM) 50 MG tablet Take 25-50 mg by mouth every 4 (four) hours as needed (for migraines).         DISCHARGE INSTRUCTIONS:  Recommend daily to every other day dressings with dry bulky gauze.  DIET:  Diabetic diet  DISCHARGE CONDITION:  Stable  ACTIVITY:   Heel WB for transfer but has rolling knee scooter for NWB mainly. OXYGEN:  Home Oxygen: No.   Oxygen Delivery: room air  DISCHARGE LOCATION:  home   If you experience worsening of your admission symptoms, develop shortness of breath, life threatening emergency, suicidal or homicidal thoughts you must seek medical attention immediately by calling 911 or calling your MD immediately  if symptoms less severe.  You Must read complete instructions/literature along with all the possible adverse reactions/side effects for all the Medicines you take and that have been prescribed to you. Take any new Medicines after you have completely understood and accpet all the possible adverse reactions/side effects.   Please note  You were cared for  by a hospitalist during your hospital stay. If you have any questions about your discharge medications or the care you received while you were in the hospital after you are discharged, you can call the unit and asked to speak with the hospitalist on call if the hospitalist that took care of you is not available. Once you are discharged, your primary care physician will handle any further medical issues. Please note that NO REFILLS for any discharge medications will be authorized once you are discharged, as it is imperative that you return to your primary care physician (or establish a relationship with a primary care physician if you do not have one) for your aftercare needs so that they can reassess your need for medications and monitor your lab values.    On the day of Discharge:   VITAL SIGNS:  Blood pressure 139/73, pulse 63, temperature 97.7 F (36.5 C), temperature source Oral, resp. rate 16, height 5\' 9"  (1.753 m), weight 120.203 kg (265 lb), SpO2 96 %.  I/O:  Intake/Output Summary (Last 24 hours) at 03/18/15 0933 Last data filed at 03/18/15 0854  Gross per 24 hour  Intake    720 ml  Output   1400 ml  Net   -680 ml    PHYSICAL EXAMINATION:  GENERAL:  50 y.o.-year-old patient lying in the bed with no acute distress.  EYES: Pupils equal, round, reactive to light and accommodation. No scleral icterus. Extraocular muscles intact.  HEENT: Head atraumatic, normocephalic. Oropharynx and nasopharynx clear.  NECK:  Supple, no jugular venous distention. No thyroid enlargement, no tenderness.  LUNGS: Normal breath sounds bilaterally, no wheezing, rales,rhonchi or crepitation. No use of accessory muscles of respiration.  CARDIOVASCULAR: S1, S2 normal. No murmurs, rubs, or gallops.  ABDOMEN: Soft, non-tender, non-distended. Bowel sounds present. No organomegaly or mass.  EXTREMITIES: No pedal edema, cyanosis, or clubbing. Left foot dressing intact clean/Drive NEUROLOGIC: Cranial nerves II  through XII are intact. Muscle strength 5/5 in all extremities. Sensation intact. Gait not checked.  PSYCHIATRIC: The patient is alert and oriented x 3.  SKIN: No obvious rash, lesion, or ulcer.   DATA REVIEW:   CBC  Recent Labs Lab 03/17/15 0528  WBC 8.4  HGB 11.6*  HCT 34.8*  PLT 252    Chemistries   Recent Labs Lab 03/13/15 1709  03/17/15 0528  NA 131*  < > 137  K 4.4  < > 3.9  CL 98*  < > 103  CO2 23  < > 28  GLUCOSE 237*  < > 115*  BUN 14  < > 17  CREATININE 0.87  < > 0.59  CALCIUM 8.8*  < > 8.2*  AST 13*  --   --   ALT 15  --   --   ALKPHOS 81  --   --   BILITOT 0.8  --   --   < > = values in this interval not displayed.  Cardiac Enzymes No results for input(s): TROPONINI in the last 168 hours.  Microbiology Results  Results for orders placed or performed during the hospital encounter of 03/13/15  Blood Culture (routine x 2)     Status: None   Collection Time: 03/13/15  8:39 PM  Result Value Ref Range Status   Specimen Description BLOOD LEFT ASSIST CONTROL  Final   Special Requests BOTTLES DRAWN AEROBIC AND ANAEROBIC  Final   Culture NO GROWTH 5 DAYS  Final   Report Status 03/18/2015 FINAL  Final  Urine culture     Status: None   Collection Time: 03/14/15  1:01 AM  Result Value Ref Range Status   Specimen Description URINE, RANDOM  Final   Special Requests NONE  Final   Culture INSIGNIFICANT GROWTH  Final   Report Status 03/15/2015 FINAL  Final  Blood Culture (routine x 2)     Status: None (Preliminary result)   Collection Time: 03/14/15  2:59 AM  Result Value Ref Range Status   Specimen Description BLOOD RIGHT ANTECUBITAL  Final   Special Requests BOTTLES DRAWN AEROBIC AND ANAEROBIC  Final   Culture NO GROWTH 4 DAYS  Final   Report Status PENDING  Incomplete  Wound culture     Status: None   Collection Time: 03/14/15  5:04 PM  Result Value Ref Range Status   Specimen Description ABSCESS  Final   Special Requests Immunocompromised   Final   Gram Stain FEW WBC SEEN FEW GRAM POSITIVE COCCI   Final   Culture   Final  MODERATE GROWTH GROUP B STREP(S.AGALACTIAE)ISOLATED Virtually 100% of S. agalactiae (Group B) strains are susceptible to Penicillin.  For Penicillin-allergic patients, Erythromycin (85-95% sensitive) and Clindamycin (80% sensitive) are drugs of choice. Contact microbiology lab to request sensitivities if  needed within 7 days.    Report Status 03/17/2015 FINAL  Final  Wound culture     Status: None (Preliminary result)   Collection Time: 03/16/15  1:10 PM  Result Value Ref Range Status   Specimen Description TOE  Final   Special Requests NONE  Final   Gram Stain MANY WBC SEEN MODERATE GRAM POSITIVE COCCI   Final   Culture   Final    MODERATE GROWTH GROUP B STREP(S.AGALACTIAE)ISOLATED Virtually 100% of S. agalactiae (Group B) strains are susceptible to Penicillin.  For Penicillin-allergic patients, Erythromycin (85-95% sensitive) and Clindamycin (80% sensitive) are drugs of choice. Contact microbiology lab to request sensitivities if  needed within 7 days.    Report Status PENDING  Incomplete    RADIOLOGY:  No results found.   Management plans discussed with the patient, family and they are in agreement.  CODE STATUS:     Code Status Orders        Start     Ordered   03/14/15 0243  Full code   Continuous     03/14/15 0243    Code Status History    Date Active Date Inactive Code Status Order ID Comments User Context   This patient has a current code status but no historical code status.      TOTAL TIME TAKING CARE OF THIS PATIENT: 28 minutes.    Treyten Monestime,  Mardi Mainland.D on 03/18/2015 at 9:33 AM  Between 7am to 6pm - Pager - [phone]  After 6pm go to [email or web address] - password [email or web address] [facility]  [facility]  Office  [phone]  CC: Primary care physician; Lorie Phenix, MD

## 2015-03-19 LAB — CULTURE, BLOOD (ROUTINE X 2): Culture: NO GROWTH

## 2015-03-20 LAB — ANAEROBIC CULTURE

## 2015-03-20 LAB — SURGICAL PATHOLOGY

## 2015-03-28 ENCOUNTER — Ambulatory Visit: Payer: Self-pay | Admitting: Obstetrics and Gynecology

## 2015-03-29 ENCOUNTER — Ambulatory Visit (INDEPENDENT_AMBULATORY_CARE_PROVIDER_SITE_OTHER): Payer: 59 | Admitting: Family Medicine

## 2015-03-29 ENCOUNTER — Other Ambulatory Visit: Payer: Self-pay

## 2015-03-29 ENCOUNTER — Encounter: Payer: Self-pay | Admitting: Family Medicine

## 2015-03-29 VITALS — BP 146/84 | HR 80 | Temp 98.1°F | Resp 16 | Wt 263.0 lb

## 2015-03-29 DIAGNOSIS — E1142 Type 2 diabetes mellitus with diabetic polyneuropathy: Secondary | ICD-10-CM | POA: Diagnosis not present

## 2015-03-29 DIAGNOSIS — J4520 Mild intermittent asthma, uncomplicated: Secondary | ICD-10-CM

## 2015-03-29 DIAGNOSIS — G43809 Other migraine, not intractable, without status migrainosus: Secondary | ICD-10-CM

## 2015-03-29 MED ORDER — ALBUTEROL SULFATE HFA 108 (90 BASE) MCG/ACT IN AERS: 2.0000 | INHALATION_SPRAY | RESPIRATORY_TRACT | Status: AC | PRN

## 2015-03-29 MED ORDER — METFORMIN HCL 500 MG PO TABS: 1500.0000 mg | ORAL_TABLET | Freq: Two times a day (BID) | ORAL | Status: DC

## 2015-03-29 NOTE — Progress Notes (Signed)
Subjective:    Patient ID: Nancy [name], female    DOB: 1965/09/06, 50 y.o.   MRN: [id number]  Migraine  This is a chronic problem. Episode onset: years. The problem occurs daily. The problem has been unchanged (have "learned to tolerate" headaches). The pain is located in the retro-orbital region. The pain does not radiate. The quality of the pain is described as aching and stabbing. The pain is at a severity of 6/10. The pain is moderate. Associated symptoms include anorexia, ear pain (right ear "popped"), eye pain, phonophobia, photophobia and tinnitus. Pertinent negatives include no abdominal pain, abnormal behavior, blurred vision, dizziness, eye redness, loss of balance, numbness, vomiting or weakness. The symptoms are aggravated by bright light. Treatments tried: Excedrin, Tramadol, occasionally Toradol injection. The treatment provided moderate relief. Her past medical history is significant for migraine headaches.    Follow up Hospitalization  Patient was admitted to [facility] on 03/13/2015 and discharged on 03/18/2015. She was treated for foot infection. Treatment for this included toe amputation.  ------------------------------------------------------------------------------------     Review of Systems  HENT: Positive for ear pain (right ear "popped") and tinnitus.   Eyes: Positive for photophobia and pain. Negative for blurred vision and redness.  Gastrointestinal: Positive for anorexia. Negative for vomiting and abdominal pain.  Neurological: Negative for dizziness, weakness, numbness and loss of balance.   BP 146/84 mmHg  Pulse 80  Temp(Src) 98.1 F (36.7 C) (Oral)  Resp 16  Wt 263 lb (119.296 kg)  LMP 12/26/2014   Patient Active Problem List   Diagnosis Date Noted  . Diabetic foot infection (HCC) 03/13/2015  . Diabetes mellitus, type 2 (HCC) 03/13/2015  . Sepsis secondary to diabetic foot infection 03/13/2015  . Tobacco abuse 03/13/2015  . Hyponatremia  03/13/2015  . Allergic rhinitis 05/10/2014  . Airway hyperreactivity 05/10/2014  . Atherosclerosis of coronary artery 05/10/2014  . Breast cyst 05/10/2014  . Clinical depression 05/10/2014  . Diabetic neuropathy (HCC) 05/10/2014  . Acid reflux 05/10/2014  . Combined fat and carbohydrate induced hyperlipemia 05/10/2014  . BP (high blood pressure) 05/10/2014  . Headache, migraine 05/10/2014  . Extreme obesity (HCC) 05/10/2014  . Disorder of peripheral nervous system (HCC) 05/10/2014  . 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 Adverse effect of motion 05/10/2014  . Essential hypertension 08/09/2008  . CCF (congestive cardiac failure) (HCC) 06/06/2008   Past Medical History  Diagnosis Date  . Diabetes mellitus   . Eczema   . Arthritis     r knee  . Anxiety     panic attacks  . Asthma      tx er 08/2010  . Unspecified essential hypertension     FO[facility]ED BY  [facility].PREVIOUSLY SEEN BY [place].ECHO 2008  . Atherosclerosis of coronary artery 05/10/2014  . [facility] (congestive cardiac failure) (HCC) 06/06/2008    30% EF   . Acid reflux 05/10/2014  . Apical ballooning syndrome 05/10/2014   Current Outpatient Prescriptions on File Prior to Visit  Medication Sig  . aspirin EC 81 MG tablet Take 81 mg by mouth at bedtime.  Marland Kitchen. aspirin-acetaminophen-caffeine (EXCEDRIN MIGRAINE) 250-250-65 MG tablet Take 2 tablets by mouth every 6 (six) hours as needed for migraine.  . carvedilol (COREG) 3.125 MG tablet Take 3.125 mg by mouth 2 (two) times daily with a meal.    . clindamycin (CLEOCIN) 300 MG capsule Take 1 capsule (300 mg total) by mouth 3 (three) times daily.  . diazepam (VALIUM) 10 MG tablet Take 5-10 mg by mouth  every 6 (six) hours as needed for anxiety.   Marland Kitchen HYDROcodone-acetaminophen (NORCO/VICODIN) 5-325 MG tablet Take 1-2 tablets by mouth every 4 (four) hours as needed for moderate pain.  Marland Kitchen ibuprofen (ADVIL,MOTRIN) 800 MG tablet Take 800 mg by mouth every 8 (eight) hours as needed (for  migraines).   . insulin NPH-regular Human (NOVOLIN 70/30) (70-30) 100 UNIT/ML injection Inject 40 Units into the skin 2 (two) times daily.   Marland Kitchen lisinopril (PRINIVIL,ZESTRIL) 10 MG tablet Take 10 mg by mouth 2 (two) times daily.  . metFORMIN (GLUCOPHAGE) 500 MG tablet Take 1,000-1,500 mg by mouth 2 (two) times daily with a meal. Pt takes two tablets with breakfast and three tablets with dinner.  . pravastatin (PRAVACHOL) 40 MG tablet Take 1 tablet (40 mg total) by mouth at bedtime.  . traMADol (ULTRAM) 50 MG tablet Take 25-50 mg by mouth every 4 (four) hours as needed (for migraines).  Marland Kitchen albuterol (PROVENTIL HFA;VENTOLIN HFA) 108 (90 BASE) MCG/ACT inhaler Inhale 2 puffs into the lungs every 4 (four) hours as needed for wheezing or shortness of breath. Reported on 03/29/2015  . docusate sodium (COLACE) 100 MG capsule Take 1 capsule (100 mg total) by mouth 2 (two) times daily. (Patient not taking: Reported on 03/29/2015)  . promethazine (PHENERGAN) 25 MG tablet Take 25 mg by mouth every 6 (six) hours as needed for nausea or vomiting. Reported on 03/29/2015   No current facility-administered medications on file prior to visit.   Allergies  Allergen Reactions  . Shellfish Allergy Anaphylaxis  . Morphine And Related Other (See Comments)    Reaction:  Severe migraines   . Percocet [Oxycodone-Acetaminophen] Nausea And Vomiting  . Penicillins Rash and Other (See Comments)    Has patient had a PCN reaction causing immediate rash, facial/tongue/throat swelling, SOB or lightheadedness with hypotension: No Has patient had a PCN reaction causing severe rash involving mucus membranes or skin necrosis: No Has patient had a PCN reaction that required hospitalization No Has patient had a PCN reaction occurring within the last 10 years: No If all of the above answers are "NO", then may proceed with Cephalosporin use.  . Sulfa Antibiotics Rash  . Theophyllines Itching and Rash   Past Surgical History  Procedure  Laterality Date  . Cholecystectomy    . Kidney stone removal    . Tonsillectomy    . Cardiac catheterization      2011 followed by dr bensimohn  . Knee arthroscopy w/ meniscal repair    . Total knee arthroplasty  01/11/2011    Procedure: TOTAL KNEE ARTHROPLASTY;  Surgeon: Harvie Junior;  Location: [facility] OR;  Service: Orthopedics;  Laterality: Right;  COMPUTER ASSISTED TOTAL KNEE REPLACEMENT  . Nose surgery  1991    due to fracture  . Toe amputation      second toe  . Amputation toe Left 03/16/2015    Procedure: left fifthe toe amputation with incision and drainage;  Surgeon: Gwyneth Revels, DPM;  Location: [facility] ORS;  Service: Podiatry;  Laterality: Left;   Social History   Social History  . Marital Status: Single    Spouse Name: N/A  . Number of Children: N/A  . Years of Education: N/A   Occupational History  . Not on file.   Social History Main Topics  . Smoking status: Former Smoker -- 0.00 packs/day for 30 years    Quit date: 03/13/2015  . Smokeless tobacco: Not on file  . Alcohol Use: No  . Drug Use: No  .  Sexual Activity: Not on file   Other Topics Concern  . Not on file   Social History Narrative   Full time. Does not regularly exercise. Domestic partner.    Family History  Problem Relation Age of Onset  . Cancer Other   . Crohn's disease Other   . GER disease Other   . Transient ischemic attack Mother   . Asthma Mother   . Hypertension Mother   . Lung cancer Father   . Hypertension Father   . Asthma Sister   . Anxiety disorder Sister   . Stomach cancer Maternal Grandmother   . Colon cancer Maternal Grandmother   . Leukemia Maternal Grandmother   . Melanoma Maternal Grandfather   . Asthma Sister   . Hypertension Brother        Objective:   Physical Exam  Constitutional: She is oriented to person, place, and time. She appears well-developed and well-nourished.  Cardiovascular: Normal rate and regular rhythm.   Pulmonary/Chest: Effort normal and breath  sounds normal.  Neurological: She is alert and oriented to person, place, and time.  Psychiatric: She has a normal mood and affect. Her behavior is normal. Judgment and thought content normal.   BP 146/84 mmHg  Pulse 80  Temp(Src) 98.1 F (36.7 C) (Oral)  Resp 16  Wt 263 lb (119.296 kg)  LMP 12/26/2014      Assessment & Plan:  1. Diabetic polyneuropathy associated with type 2 diabetes mellitus (HCC) Follow up with new doctor to monitor her diabetes.  Stressed again importance of compliance.  Has had another surgery for infected toe.   Has had a long history of non-compliance. Hopefully, will continue to be more compliant this time.   2. Other type of migraine Does have chronic migraines. Does not come in here frequently. Takes medication at home and can not work while on medication. Did fill out FMLA paperwork.    3. Airway hyperreactivity, mild intermittent, uncomplicated Stable. Refill medication.    Patient was seen and examined by Leo Grosser, MD, and note scribed by Allene Dillon, CMA. I have reviewed the document for accuracy and completeness and I agree with above. Leo Grosser, MD   Lorie Phenix, MD

## 2015-04-04 ENCOUNTER — Inpatient Hospital Stay: Admission: RE | Admit: 2015-04-04 | Payer: Self-pay | Source: Ambulatory Visit

## 2015-04-13 ENCOUNTER — Other Ambulatory Visit: Payer: Self-pay | Admitting: Family Medicine

## 2015-04-13 MED ORDER — LISINOPRIL 10 MG PO TABS: 10.0000 mg | ORAL_TABLET | Freq: Two times a day (BID) | ORAL | Status: DC

## 2015-04-13 NOTE — Telephone Encounter (Signed)
Please review. Ok to refill for patient? Please advise. Thanks!

## 2015-04-13 NOTE — Telephone Encounter (Signed)
Rx sent.  Please notify  Patient. Thanks.

## 2015-04-13 NOTE — Telephone Encounter (Signed)
Pt contacted office for refill request on the following medications: lisinopril (PRINIVIL,ZESTRIL) 10 MG tablet to [facility] [place]. Pt stated she forgot to ask for refills when she was in the office last b/c she thought she had enough to last until she sees [facility]. Pt would like to get a refill to last until [facility]. Pt would like a call back to inform her either if it will or will not be refilled. Please advise. Thanks TNP

## 2015-04-14 ENCOUNTER — Ambulatory Visit: Admission: RE | Admit: 2015-04-14 | Payer: Self-pay | Source: Ambulatory Visit | Admitting: Podiatry

## 2015-05-03 ENCOUNTER — Encounter: Payer: Self-pay | Admitting: Obstetrics and Gynecology

## 2015-06-12 ENCOUNTER — Other Ambulatory Visit: Payer: Self-pay | Admitting: Family Medicine

## 2015-06-12 DIAGNOSIS — E1100 Type 2 diabetes mellitus with hyperosmolarity without nonketotic hyperglycemic-hyperosmolar coma (NKHHC): Secondary | ICD-10-CM

## 2015-06-12 DIAGNOSIS — G64 Other disorders of peripheral nervous system: Secondary | ICD-10-CM

## 2015-06-13 ENCOUNTER — Other Ambulatory Visit: Payer: Self-pay

## 2015-07-26 ENCOUNTER — Other Ambulatory Visit: Payer: Self-pay | Admitting: Family Medicine

## 2015-07-27 NOTE — Telephone Encounter (Signed)
Sees [facility] for primary care now. Allene DillonEmily Drozdowski, CMA

## 2015-07-29 ENCOUNTER — Other Ambulatory Visit: Payer: Self-pay | Admitting: Family Medicine

## 2015-08-03 ENCOUNTER — Ambulatory Visit: Payer: 59 | Admitting: Obstetrics and Gynecology

## 2015-08-10 ENCOUNTER — Encounter: Payer: Self-pay | Admitting: Obstetrics and Gynecology

## 2015-08-10 ENCOUNTER — Ambulatory Visit (INDEPENDENT_AMBULATORY_CARE_PROVIDER_SITE_OTHER): Payer: 59 | Admitting: Obstetrics and Gynecology

## 2015-08-10 ENCOUNTER — Telehealth: Payer: Self-pay | Admitting: Obstetrics and Gynecology

## 2015-08-10 VITALS — BP 160/80 | HR 80 | Ht 69.0 in | Wt 275.1 lb

## 2015-08-10 DIAGNOSIS — L0293 Carbuncle, unspecified: Secondary | ICD-10-CM | POA: Diagnosis not present

## 2015-08-10 DIAGNOSIS — N951 Menopausal and female climacteric states: Secondary | ICD-10-CM | POA: Diagnosis not present

## 2015-08-10 NOTE — Telephone Encounter (Signed)
Please inform patient that on review of her allergies, she is allergic to oxycodone (in Percocet), but it did not list codeine as an allergy.  If she is allergic to this, I can prescribe her a different medication.  What has she used in the past that has worked for her.

## 2015-08-10 NOTE — Telephone Encounter (Signed)
Pt called and she is allergic to codeine and cherry prescribed her tylenol 3 with codein.

## 2015-08-10 NOTE — Telephone Encounter (Signed)
Please advise 

## 2015-08-10 NOTE — Patient Instructions (Addendum)
-   Try [place] (1 tablet at night before bedtime) 1st for 7 days. If no relief, then can start the Premarin tablets daily for 7 days.   - Call office to inform nurse of which medication works best and a prescription will be called in.   - You will follow up in 6 weeks for annual exam and discuss medication

## 2015-08-10 NOTE — Progress Notes (Signed)
GYNECOLOGY PROGRESS NOTE  Subjective:    Patient ID: Nancy [name], female    DOB: Aug 21, 1965, 50 y.o.   MRN: [id number]  HPI  Patient is a 50 y.o. P0. female who presents for assessment of boil and discuss menopausal symptoms (severe hot flushes).   1)  Patient complains of recurrent boils in the vaginal area, occurring approximately every 3 months. Patient has had to be hospitalized at least once this year for sepsis due to cellulitis from the boils.  She has been on many different antibiotics however they still seem to recur. Is also trying to lanced them when they occur. 2) patient also noting menopausal symptoms, especially severe hot flushes. This has been going on for "quite some time", however now is affecting her daily life. Hot flushes occur multiple times a day. 3) Patient is desires to have an annual exam. She states that it has been a while.   The following portions of the patient's history were reviewed and updated as appropriate: allergies, current medications, past family history, past medical history, past social history, past surgical history and problem list.  Review of Systems A comprehensive review of systems was negative except for: Neurological: positive for headache (migraine with onset ~ 5 min ago), and what's noted in HPI.   Objective:   Blood pressure (!) 160/80, pulse 80, height 5\' 9"  (1.753 m), weight 275 lb 1.6 oz (124.8 kg). General appearance: alert and no distress Abdomen: soft, non-tender; bowel sounds normal; no masses,  no organomegaly Pelvic: external genitalia with several scarred over lesions (patient reports lancing at home) on right mons pubis.  Rectovaginal septum normal.  Vagina without discharge.  Cervix normal appearing, no lesions and no motion tenderness.  Uterus mobile, nontender, normal shape and size.  Adnexae non-palpable, nontender bilaterally.  Extremities: extremities normal, atraumatic, no cyanosis or edema Neurologic:  Grossly normal   Assessment:   Recurrent vulvar abscesses (boils) Menopausal vasomotor symptoms Need for annual exam  Plan:   1) Discussion had with patient regarding management of recurrent vulvar abscesses. Discussed hygiene including watching vulvar area with antibacterial soaps at least twice a day, and using Neosporin to the area that has the recurrent abscess. Patientshe has been placed on several different antibiotics in the past, however abscesses tend to recur. Patient may benefit from dermatology referral. Also recommend that when next will occurs, that patient come in for lancing in the office in order to obtain a culture to ensure adequate treatment.  2) Patient with bothersome menopausal vasomotor symptoms. Discussed lifestyle interventions such as wearing light clothing, remaining in cool environments, having fan/air conditioner in the room, avoiding hot beverages etc.  Discussed using hormone therapy and concerns about increased risk of heart disease, cerebrovascular disease, thromboembolic disease,  and breast cancer.  Also discussed other medical options such as Paxil, Effexor or Neurontin.   Also discussed alternative therapies such as herbal remedies but cautioned that most of the products contained phytoestrogens (plant estrogens) in unregulated amounts which can have the same effects on the body as the pharmaceutical estrogen preparations.   Patient unsure desires. Given samples of both [place] for 1 week and for Premarin tablets for 1 week. Patient to contact M.D. after use of samples to inform which medication worked best, and for a prescription. Patient will need to follow up in approximately 6 weeks after initiation of medication. 3) patient reports that she is overdue for an annual exam.  We'll schedule for patient to return in approximately  6 weeks to perform annual exam and assess current medications.    Hildred Laser, MD [facility]

## 2015-08-14 NOTE — Telephone Encounter (Signed)
Called pt no answer, LM for pt informing her of the need to verify allergies and change medication

## 2015-08-14 NOTE — Telephone Encounter (Signed)
Pt calls back states that when she takes codeine she gets bad migraines. Pt states that she usually has to have dilaudid due to her sensitivity to other drugs. Asked pt about her pain currently she states that at the moment it's not that bad.

## 2015-08-29 ENCOUNTER — Other Ambulatory Visit: Payer: Self-pay | Admitting: Family Medicine

## 2015-09-04 ENCOUNTER — Telehealth: Payer: Self-pay | Admitting: Obstetrics and Gynecology

## 2015-09-04 DIAGNOSIS — B373 Candidiasis of vulva and vagina: Secondary | ICD-10-CM

## 2015-09-04 DIAGNOSIS — B3731 Acute candidiasis of vulva and vagina: Secondary | ICD-10-CM

## 2015-09-04 MED ORDER — FLUCONAZOLE 150 MG PO TABS: 150.0000 mg | ORAL_TABLET | Freq: Once | ORAL | 3 refills | Status: AC

## 2015-09-04 NOTE — Telephone Encounter (Signed)
Medication sent in. 

## 2015-09-04 NOTE — Telephone Encounter (Signed)
Pt called and left message on office VM stating that she has been trying to get a refill on he rmedication, she stated that [facility] 2 refill request, she was calling to see if we have received them and have done the refill, pt would like a call back and given update.

## 2015-10-05 ENCOUNTER — Other Ambulatory Visit: Payer: Self-pay | Admitting: Family Medicine

## 2015-10-05 ENCOUNTER — Ambulatory Visit (INDEPENDENT_AMBULATORY_CARE_PROVIDER_SITE_OTHER): Payer: 59 | Admitting: Obstetrics and Gynecology

## 2015-10-05 ENCOUNTER — Encounter: Payer: Self-pay | Admitting: Obstetrics and Gynecology

## 2015-10-05 VITALS — BP 157/84 | HR 73 | Ht 69.0 in | Wt 270.5 lb

## 2015-10-05 DIAGNOSIS — Z1211 Encounter for screening for malignant neoplasm of colon: Secondary | ICD-10-CM

## 2015-10-05 DIAGNOSIS — Z13220 Encounter for screening for lipoid disorders: Secondary | ICD-10-CM

## 2015-10-05 DIAGNOSIS — E782 Mixed hyperlipidemia: Secondary | ICD-10-CM

## 2015-10-05 DIAGNOSIS — Z01419 Encounter for gynecological examination (general) (routine) without abnormal findings: Secondary | ICD-10-CM

## 2015-10-05 DIAGNOSIS — Z1239 Encounter for other screening for malignant neoplasm of breast: Secondary | ICD-10-CM | POA: Diagnosis not present

## 2015-10-05 DIAGNOSIS — Z131 Encounter for screening for diabetes mellitus: Secondary | ICD-10-CM | POA: Diagnosis not present

## 2015-10-05 NOTE — Patient Instructions (Signed)
Preventive Care for Adults, Female A healthy lifestyle and preventive care can promote health and wellness. Preventive health guidelines for women include the following key practices.  A routine yearly physical is a good way to check with your health care provider about your health and preventive screening. It is a chance to share any concerns and updates on your health and to receive a thorough exam.  Visit your dentist for a routine exam and preventive care every 6 months. Brush your teeth twice a day and floss once a day. Good oral hygiene prevents tooth decay and gum disease.  The frequency of eye exams is based on your age, health, family medical history, use of contact lenses, and other factors. Follow your health care provider's recommendations for frequency of eye exams.  Eat a healthy diet. Foods like vegetables, fruits, whole grains, low-fat dairy products, and lean protein foods contain the nutrients you need without too many calories. Decrease your intake of foods high in solid fats, added sugars, and salt. Eat the right amount of calories for you.Get information about a proper diet from your health care provider, if necessary.  Regular physical exercise is one of the most important things you can do for your health. Most adults should get at least 150 minutes of moderate-intensity exercise (any activity that increases your heart rate and causes you to sweat) each week. In addition, most adults need muscle-strengthening exercises on 2 or more days a week.  Maintain a healthy weight. The body mass index (BMI) is a screening tool to identify possible weight problems. It provides an estimate of body fat based on height and weight. Your health care provider can find your BMI and can help you achieve or maintain a healthy weight.For adults 20 years and older:  A BMI below 18.5 is considered underweight.  A BMI of 18.5 to 24.9 is normal.  A BMI of 25 to 29.9 is considered  overweight.  A BMI of 30 and above is considered obese.  Maintain normal blood lipids and cholesterol levels by exercising and minimizing your intake of saturated fat. Eat a balanced diet with plenty of fruit and vegetables. Blood tests for lipids and cholesterol should begin at age 64 and be repeated every 5 years. If your lipid or cholesterol levels are high, you are over 50, or you are at high risk for heart disease, you may need your cholesterol levels checked more frequently.Ongoing high lipid and cholesterol levels should be treated with medicines if diet and exercise are not working.  If you smoke, find out from your health care provider how to quit. If you do not use tobacco, do not start.  Lung cancer screening is recommended for adults aged 52-80 years who are at high risk for developing lung cancer because of a history of smoking. A yearly low-dose CT scan of the lungs is recommended for people who have at least a 30-pack-year history of smoking and are a current smoker or have quit within the past 15 years. A pack year of smoking is smoking an average of 1 pack of cigarettes a day for 1 year (for example: 1 pack a day for 30 years or 2 packs a day for 15 years). Yearly screening should continue until the smoker has stopped smoking for at least 15 years. Yearly screening should be stopped for people who develop a health problem that would prevent them from having lung cancer treatment.  If you are pregnant, do not drink alcohol. If you are  breastfeeding, be very cautious about drinking alcohol. If you are not pregnant and choose to drink alcohol, do not have more than 1 drink per day. One drink is considered to be 12 ounces (355 mL) of beer, 5 ounces (148 mL) of wine, or 1.5 ounces (44 mL) of liquor.  Avoid use of street drugs. Do not share needles with anyone. Ask for help if you need support or instructions about stopping the use of drugs.  High blood pressure causes heart disease and  increases the risk of stroke. Your blood pressure should be checked at least every 1 to 2 years. Ongoing high blood pressure should be treated with medicines if weight loss and exercise do not work.  If you are 25-78 years old, ask your health care provider if you should take aspirin to prevent strokes.  Diabetes screening is done by taking a blood sample to check your blood glucose level after you have not eaten for a certain period of time (fasting). If you are not overweight and you do not have risk factors for diabetes, you should be screened once every 3 years starting at age 86. If you are overweight or obese and you are 3-87 years of age, you should be screened for diabetes every year as part of your cardiovascular risk assessment.  Breast cancer screening is essential preventive care for women. You should practice "breast self-awareness." This means understanding the normal appearance and feel of your breasts and may include breast self-examination. Any changes detected, no matter how small, should be reported to a health care provider. Women in their 66s and 30s should have a clinical breast exam (CBE) by a health care provider as part of a regular health exam every 1 to 3 years. After age 43, women should have a CBE every year. Starting at age 37, women should consider having a mammogram (breast X-ray test) every year. Women who have a family history of breast cancer should talk to their health care provider about genetic screening. Women at a high risk of breast cancer should talk to their health care providers about having an MRI and a mammogram every year.  Breast cancer gene (BRCA)-related cancer risk assessment is recommended for women who have family members with BRCA-related cancers. BRCA-related cancers include breast, ovarian, tubal, and peritoneal cancers. Having family members with these cancers may be associated with an increased risk for harmful changes (mutations) in the breast  cancer genes BRCA1 and BRCA2. Results of the assessment will determine the need for genetic counseling and BRCA1 and BRCA2 testing.  Your health care provider may recommend that you be screened regularly for cancer of the pelvic organs (ovaries, uterus, and vagina). This screening involves a pelvic examination, including checking for microscopic changes to the surface of your cervix (Pap test). You may be encouraged to have this screening done every 3 years, beginning at age 78.  For women ages 79-65, health care providers may recommend pelvic exams and Pap testing every 3 years, or they may recommend the Pap and pelvic exam, combined with testing for human papilloma virus (HPV), every 5 years. Some types of HPV increase your risk of cervical cancer. Testing for HPV may also be done on women of any age with unclear Pap test results.  Other health care providers may not recommend any screening for nonpregnant women who are considered low risk for pelvic cancer and who do not have symptoms. Ask your health care provider if a screening pelvic exam is right for  you.  If you have had past treatment for cervical cancer or a condition that could lead to cancer, you need Pap tests and screening for cancer for at least 20 years after your treatment. If Pap tests have been discontinued, your risk factors (such as having a new sexual partner) need to be reassessed to determine if screening should resume. Some women have medical problems that increase the chance of getting cervical cancer. In these cases, your health care provider may recommend more frequent screening and Pap tests.  Colorectal cancer can be detected and often prevented. Most routine colorectal cancer screening begins at the age of 50 years and continues through age 75 years. However, your health care provider may recommend screening at an earlier age if you have risk factors for colon cancer. On a yearly basis, your health care provider may provide  home test kits to check for hidden blood in the stool. Use of a small camera at the end of a tube, to directly examine the colon (sigmoidoscopy or colonoscopy), can detect the earliest forms of colorectal cancer. Talk to your health care provider about this at age 50, when routine screening begins. Direct exam of the colon should be repeated every 5-10 years through age 75 years, unless early forms of precancerous polyps or small growths are found.  People who are at an increased risk for hepatitis B should be screened for this virus. You are considered at high risk for hepatitis B if:  You were born in a country where hepatitis B occurs often. Talk with your health care provider about which countries are considered high risk.  Your parents were born in a high-risk country and you have not received a shot to protect against hepatitis B (hepatitis B vaccine).  You have HIV or AIDS.  You use needles to inject street drugs.  You live with, or have sex with, someone who has hepatitis B.  You get hemodialysis treatment.  You take certain medicines for conditions like cancer, organ transplantation, and autoimmune conditions.  Hepatitis C blood testing is recommended for all people born from 1945 through 1965 and any individual with known risks for hepatitis C.  Practice safe sex. Use condoms and avoid high-risk sexual practices to reduce the spread of sexually transmitted infections (STIs). STIs include gonorrhea, chlamydia, syphilis, trichomonas, herpes, HPV, and human immunodeficiency virus (HIV). Herpes, HIV, and HPV are viral illnesses that have no cure. They can result in disability, cancer, and death.  You should be screened for sexually transmitted illnesses (STIs) including gonorrhea and chlamydia if:  You are sexually active and are younger than 24 years.  You are older than 24 years and your health care provider tells you that you are at risk for this type of infection.  Your sexual  activity has changed since you were last screened and you are at an increased risk for chlamydia or gonorrhea. Ask your health care provider if you are at risk.  If you are at risk of being infected with HIV, it is recommended that you take a prescription medicine daily to prevent HIV infection. This is called preexposure prophylaxis (PrEP). You are considered at risk if:  You are sexually active and do not regularly use condoms or know the HIV status of your partner(s).  You take drugs by injection.  You are sexually active with a partner who has HIV.  Talk with your health care provider about whether you are at high risk of being infected with HIV. If   you choose to begin PrEP, you should first be tested for HIV. You should then be tested every 3 months for as long as you are taking PrEP.  Osteoporosis is a disease in which the bones lose minerals and strength with aging. This can result in serious bone fractures or breaks. The risk of osteoporosis can be identified using a bone density scan. Women ages 1 years and over and women at risk for fractures or osteoporosis should discuss screening with their health care providers. Ask your health care provider whether you should take a calcium supplement or vitamin D to reduce the rate of osteoporosis.  Menopause can be associated with physical symptoms and risks. Hormone replacement therapy is available to decrease symptoms and risks. You should talk to your health care provider about whether hormone replacement therapy is right for you.  Use sunscreen. Apply sunscreen liberally and repeatedly throughout the day. You should seek shade when your shadow is shorter than you. Protect yourself by wearing long sleeves, pants, a wide-brimmed hat, and sunglasses year round, whenever you are outdoors.  Once a month, do a whole body skin exam, using a mirror to look at the skin on your back. Tell your health care provider of new moles, moles that have irregular  borders, moles that are larger than a pencil eraser, or moles that have changed in shape or color.  Stay current with required vaccines (immunizations).  Influenza vaccine. All adults should be immunized every year.  Tetanus, diphtheria, and acellular pertussis (Td, Tdap) vaccine. Pregnant women should receive 1 dose of Tdap vaccine during each pregnancy. The dose should be obtained regardless of the length of time since the last dose. Immunization is preferred during the 27th-36th week of gestation. An adult who has not previously received Tdap or who does not know her vaccine status should receive 1 dose of Tdap. This initial dose should be followed by tetanus and diphtheria toxoids (Td) booster doses every 10 years. Adults with an unknown or incomplete history of completing a 3-dose immunization series with Td-containing vaccines should begin or complete a primary immunization series including a Tdap dose. Adults should receive a Td booster every 10 years.  Varicella vaccine. An adult without evidence of immunity to varicella should receive 2 doses or a second dose if she has previously received 1 dose. Pregnant females who do not have evidence of immunity should receive the first dose after pregnancy. This first dose should be obtained before leaving the health care facility. The second dose should be obtained 4-8 weeks after the first dose.  Human papillomavirus (HPV) vaccine. Females aged 13-26 years who have not received the vaccine previously should obtain the 3-dose series. The vaccine is not recommended for use in pregnant females. However, pregnancy testing is not needed before receiving a dose. If a female is found to be pregnant after receiving a dose, no treatment is needed. In that case, the remaining doses should be delayed until after the pregnancy. Immunization is recommended for any person with an immunocompromised condition through the age of 24 years if she did not get any or all doses  earlier. During the 3-dose series, the second dose should be obtained 4-8 weeks after the first dose. The third dose should be obtained 24 weeks after the first dose and 16 weeks after the second dose.  Zoster vaccine. One dose is recommended for adults aged 97 years or older unless certain conditions are present.  Measles, mumps, and rubella (MMR) vaccine. Adults born  before 1957 generally are considered immune to measles and mumps. Adults born in [birth year] or later should have 1 or more doses of MMR vaccine unless there is a contraindication to the vaccine or there is laboratory evidence of immunity to each of the three diseases. A routine second dose of MMR vaccine should be obtained at least 28 days after the first dose for students attending postsecondary schools, health care workers, or international travelers. People who received inactivated measles vaccine or an unknown type of measles vaccine during 1963-1967 should receive 2 doses of MMR vaccine. People who received inactivated mumps vaccine or an unknown type of mumps vaccine before 1979 and are at high risk for mumps infection should consider immunization with 2 doses of MMR vaccine. For females of childbearing age, rubella immunity should be determined. If there is no evidence of immunity, females who are not pregnant should be vaccinated. If there is no evidence of immunity, females who are pregnant should delay immunization until after pregnancy. Unvaccinated health care workers born before 60 who lack laboratory evidence of measles, mumps, or rubella immunity or laboratory confirmation of disease should consider measles and mumps immunization with 2 doses of MMR vaccine or rubella immunization with 1 dose of MMR vaccine.  Pneumococcal 13-valent conjugate (PCV13) vaccine. When indicated, a person who is uncertain of his immunization history and has no record of immunization should receive the PCV13 vaccine. All adults 61 years of age and older  should receive this vaccine. An adult aged 92 years or older who has certain medical conditions and has not been previously immunized should receive 1 dose of PCV13 vaccine. This PCV13 should be followed with a dose of pneumococcal polysaccharide (PPSV23) vaccine. Adults who are at high risk for pneumococcal disease should obtain the PPSV23 vaccine at least 8 weeks after the dose of PCV13 vaccine. Adults older than 51 years of age who have normal immune system function should obtain the PPSV23 vaccine dose at least 1 year after the dose of PCV13 vaccine.  Pneumococcal polysaccharide (PPSV23) vaccine. When PCV13 is also indicated, PCV13 should be obtained first. All adults aged 2 years and older should be immunized. An adult younger than age 30 years who has certain medical conditions should be immunized. Any person who resides in a nursing home or long-term care facility should be immunized. An adult smoker should be immunized. People with an immunocompromised condition and certain other conditions should receive both PCV13 and PPSV23 vaccines. People with human immunodeficiency virus (HIV) infection should be immunized as soon as possible after diagnosis. Immunization during chemotherapy or radiation therapy should be avoided. Routine use of PPSV23 vaccine is not recommended for American Indians, [place] Natives, or people younger than 65 years unless there are medical conditions that require PPSV23 vaccine. When indicated, people who have unknown immunization and have no record of immunization should receive PPSV23 vaccine. One-time revaccination 5 years after the first dose of PPSV23 is recommended for people aged 19-64 years who have chronic kidney failure, nephrotic syndrome, asplenia, or immunocompromised conditions. People who received 1-2 doses of PPSV23 before age 44 years should receive another dose of PPSV23 vaccine at age 83 years or later if at least 5 years have passed since the previous dose. Doses  of PPSV23 are not needed for people immunized with PPSV23 at or after age 20 years.  Meningococcal vaccine. Adults with asplenia or persistent complement component deficiencies should receive 2 doses of quadrivalent meningococcal conjugate (MenACWY-D) vaccine. The doses should be obtained  at least 2 months apart. Microbiologists working with certain meningococcal bacteria, [place] recruits, people at risk during an outbreak, and people who travel to or live in countries with a high rate of meningitis should be immunized. A first-year college student up through age 28 years who is living in a residence hall should receive a dose if she did not receive a dose on or after her 16th birthday. Adults who have certain high-risk conditions should receive one or more doses of vaccine.  Hepatitis A vaccine. Adults who wish to be protected from this disease, have certain high-risk conditions, work with hepatitis A-infected animals, work in hepatitis A research labs, or travel to or work in countries with a high rate of hepatitis A should be immunized. Adults who were previously unvaccinated and who anticipate close contact with an international adoptee during the first 60 days after arrival in the [place] from a country with a high rate of hepatitis A should be immunized.  Hepatitis B vaccine. Adults who wish to be protected from this disease, have certain high-risk conditions, may be exposed to blood or other infectious body fluids, are household contacts or sex partners of hepatitis B positive people, are clients or workers in certain care facilities, or travel to or work in countries with a high rate of hepatitis B should be immunized.  Haemophilus influenzae type b (Hib) vaccine. A previously unvaccinated person with asplenia or sickle cell disease or having a scheduled splenectomy should receive 1 dose of Hib vaccine. Regardless of previous immunization, a recipient of a hematopoietic stem cell transplant  should receive a 3-dose series 6-12 months after her successful transplant. Hib vaccine is not recommended for adults with HIV infection. Preventive Services / Frequency Ages 71 to 87 years  Blood pressure check.** / Every 3-5 years.  Lipid and cholesterol check.** / Every 5 years beginning at age 1.  Clinical breast exam.** / Every 3 years for women in their 3s and 31s.  BRCA-related cancer risk assessment.** / For women who have family members with a BRCA-related cancer (breast, ovarian, tubal, or peritoneal cancers).  Pap test.** / Every 2 years from ages 50 through 86. Every 3 years starting at age 87 through age 7 or 75 with a history of 3 consecutive normal Pap tests.  HPV screening.** / Every 3 years from ages 59 through ages 35 to 6 with a history of 3 consecutive normal Pap tests.  Hepatitis C blood test.** / For any individual with known risks for hepatitis C.  Skin self-exam. / Monthly.  Influenza vaccine. / Every year.  Tetanus, diphtheria, and acellular pertussis (Tdap, Td) vaccine.** / Consult your health care provider. Pregnant women should receive 1 dose of Tdap vaccine during each pregnancy. 1 dose of Td every 10 years.  Varicella vaccine.** / Consult your health care provider. Pregnant females who do not have evidence of immunity should receive the first dose after pregnancy.  HPV vaccine. / 3 doses over 6 months, if 72 and younger. The vaccine is not recommended for use in pregnant females. However, pregnancy testing is not needed before receiving a dose.  Measles, mumps, rubella (MMR) vaccine.** / You need at least 1 dose of MMR if you were born in 1957 or later. You may also need a 2nd dose. For females of childbearing age, rubella immunity should be determined. If there is no evidence of immunity, females who are not pregnant should be vaccinated. If there is no evidence of immunity, females who are  pregnant should delay immunization until after  pregnancy.  Pneumococcal 13-valent conjugate (PCV13) vaccine.** / Consult your health care provider.  Pneumococcal polysaccharide (PPSV23) vaccine.** / 1 to 2 doses if you smoke cigarettes or if you have certain conditions.  Meningococcal vaccine.** / 1 dose if you are age 87 to 44 years and a Market researcher living in a residence hall, or have one of several medical conditions, you need to get vaccinated against meningococcal disease. You may also need additional booster doses.  Hepatitis A vaccine.** / Consult your health care provider.  Hepatitis B vaccine.** / Consult your health care provider.  Haemophilus influenzae type b (Hib) vaccine.** / Consult your health care provider. Ages 86 to 38 years  Blood pressure check.** / Every year.  Lipid and cholesterol check.** / Every 5 years beginning at age 49 years.  Lung cancer screening. / Every year if you are aged 71-80 years and have a 30-pack-year history of smoking and currently smoke or have quit within the past 15 years. Yearly screening is stopped once you have quit smoking for at least 15 years or develop a health problem that would prevent you from having lung cancer treatment.  Clinical breast exam.** / Every year after age 51 years.  BRCA-related cancer risk assessment.** / For women who have family members with a BRCA-related cancer (breast, ovarian, tubal, or peritoneal cancers).  Mammogram.** / Every year beginning at age 18 years and continuing for as long as you are in good health. Consult with your health care provider.  Pap test.** / Every 3 years starting at age 63 years through age 37 or 57 years with a history of 3 consecutive normal Pap tests.  HPV screening.** / Every 3 years from ages 41 years through ages 76 to 23 years with a history of 3 consecutive normal Pap tests.  Fecal occult blood test (FOBT) of stool. / Every year beginning at age 36 years and continuing until age 51 years. You may not need  to do this test if you get a colonoscopy every 10 years.  Flexible sigmoidoscopy or colonoscopy.** / Every 5 years for a flexible sigmoidoscopy or every 10 years for a colonoscopy beginning at age 36 years and continuing until age 35 years.  Hepatitis C blood test.** / For all people born from [birth year] through 1965 and any individual with known risks for hepatitis C.  Skin self-exam. / Monthly.  Influenza vaccine. / Every year.  Tetanus, diphtheria, and acellular pertussis (Tdap/Td) vaccine.** / Consult your health care provider. Pregnant women should receive 1 dose of Tdap vaccine during each pregnancy. 1 dose of Td every 10 years.  Varicella vaccine.** / Consult your health care provider. Pregnant females who do not have evidence of immunity should receive the first dose after pregnancy.  Zoster vaccine.** / 1 dose for adults aged 73 years or older.  Measles, mumps, rubella (MMR) vaccine.** / You need at least 1 dose of MMR if you were born in 1957 or later. You may also need a second dose. For females of childbearing age, rubella immunity should be determined. If there is no evidence of immunity, females who are not pregnant should be vaccinated. If there is no evidence of immunity, females who are pregnant should delay immunization until after pregnancy.  Pneumococcal 13-valent conjugate (PCV13) vaccine.** / Consult your health care provider.  Pneumococcal polysaccharide (PPSV23) vaccine.** / 1 to 2 doses if you smoke cigarettes or if you have certain conditions.  Meningococcal vaccine.** /  Consult your health care provider.  Hepatitis A vaccine.** / Consult your health care provider.  Hepatitis B vaccine.** / Consult your health care provider.  Haemophilus influenzae type b (Hib) vaccine.** / Consult your health care provider. Ages 80 years and over  Blood pressure check.** / Every year.  Lipid and cholesterol check.** / Every 5 years beginning at age 62 years.  Lung cancer  screening. / Every year if you are aged 32-80 years and have a 30-pack-year history of smoking and currently smoke or have quit within the past 15 years. Yearly screening is stopped once you have quit smoking for at least 15 years or develop a health problem that would prevent you from having lung cancer treatment.  Clinical breast exam.** / Every year after age 61 years.  BRCA-related cancer risk assessment.** / For women who have family members with a BRCA-related cancer (breast, ovarian, tubal, or peritoneal cancers).  Mammogram.** / Every year beginning at age 39 years and continuing for as long as you are in good health. Consult with your health care provider.  Pap test.** / Every 3 years starting at age 85 years through age 74 or 72 years with 3 consecutive normal Pap tests. Testing can be stopped between 65 and 70 years with 3 consecutive normal Pap tests and no abnormal Pap or HPV tests in the past 10 years.  HPV screening.** / Every 3 years from ages 55 years through ages 67 or 77 years with a history of 3 consecutive normal Pap tests. Testing can be stopped between 65 and 70 years with 3 consecutive normal Pap tests and no abnormal Pap or HPV tests in the past 10 years.  Fecal occult blood test (FOBT) of stool. / Every year beginning at age 81 years and continuing until age 22 years. You may not need to do this test if you get a colonoscopy every 10 years.  Flexible sigmoidoscopy or colonoscopy.** / Every 5 years for a flexible sigmoidoscopy or every 10 years for a colonoscopy beginning at age 67 years and continuing until age 22 years.  Hepatitis C blood test.** / For all people born from [birth year] through 1966 and any individual with known risks for hepatitis C.  Osteoporosis screening.** / A one-time screening for women ages 8 years and over and women at risk for fractures or osteoporosis.  Skin self-exam. / Monthly.  Influenza vaccine. / Every year.  Tetanus, diphtheria, and  acellular pertussis (Tdap/Td) vaccine.** / 1 dose of Td every 10 years.  Varicella vaccine.** / Consult your health care provider.  Zoster vaccine.** / 1 dose for adults aged 56 years or older.  Pneumococcal 13-valent conjugate (PCV13) vaccine.** / Consult your health care provider.  Pneumococcal polysaccharide (PPSV23) vaccine.** / 1 dose for all adults aged 15 years and older.  Meningococcal vaccine.** / Consult your health care provider.  Hepatitis A vaccine.** / Consult your health care provider.  Hepatitis B vaccine.** / Consult your health care provider.  Haemophilus influenzae type b (Hib) vaccine.** / Consult your health care provider. ** Family history and personal history of risk and conditions may change your health care provider's recommendations.   This information is not intended to replace advice given to you by your health care provider. Make sure you discuss any questions you have with your health care provider.   Document Released: 02/19/2001 Document Revised: 01/14/2014 Document Reviewed: 05/21/2010 Elsevier Interactive Patient Education [employer].

## 2015-10-05 NOTE — Progress Notes (Signed)
ANNUAL PREVENTATIVE CARE GYNECOLOGY  ENCOUNTER NOTE  Subjective:       Nancy [name] is a 50 y.o. G0P0000 postmenopausal female (x 1 year) here for a routine annual gynecologic exam. The patient is sexually active (same sex relationship). The patient is not taking hormone replacement therapy. Patient denies post-menopausal vaginal bleeding. The patient wears seatbelts: yes. The patient participates in regular exercise: no. Has the patient ever been transfused or tattooed?: no. The patient reports that there is not domestic violence in her life.  Current complaints: 1.  Patient continues to note painful boils in the vaginal area. Has tried multiple treatments (ncluding creams, antibiotics, antibacterial soaps).   Gynecologic History No LMP recorded. Patient is not currently having periods (Reason: Postmenopausal). Contraception: post menopausal status Last Pap: 91478. Results were: normal Last mammogram: 05/2012. Results were: normal Last Colonoscopy:  Last Dexa Scan:    Obstetric History OB History  Gravida Para Term Preterm AB Living  0 0 0 0 0 0  SAB TAB Ectopic Multiple Live Births  0 0 0 0 0        Past Medical History:  Diagnosis Date  . Acid reflux 05/10/2014  . Anxiety    panic attacks  . 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 Arthritis    r knee  . Asthma     tx er 08/2010  . Atherosclerosis of coronary artery 05/10/2014  . [facility] (congestive cardiac failure) (HCC) 06/06/2008   30% EF   . Diabetes mellitus   . Eczema   . Unspecified essential hypertension    FOLLOWED BY  [facility].PREVIOUSLY SEEN BY [facility].ECHO 2008    Family History  Problem Relation Age of Onset  . Transient ischemic attack Mother   . Asthma Mother   . Hypertension Mother   . Lung cancer Father   . Hypertension Father   . Asthma Sister   . Anxiety disorder Sister   . Stomach cancer Maternal Grandmother   . Colon cancer Maternal Grandmother   . Leukemia Maternal Grandmother     . Melanoma Maternal Grandfather   . Asthma Sister   . Hypertension Brother   . Cancer Other   . Crohn's disease Other   . GER disease Other     Past Surgical History:  Procedure Laterality Date  . AMPUTATION TOE Left 03/16/2015   Procedure: left fifthe toe amputation with incision and drainage;  Surgeon: Gwyneth Revels, DPM;  Location: [facility] ORS;  Service: Podiatry;  Laterality: Left;  . CARDIAC CATHETERIZATION     2011 followed by dr bensimohn  . CHOLECYSTECTOMY    . kidney stone removal    . KNEE ARTHROSCOPY W/ MENISCAL REPAIR    . NOSE SURGERY  1991   due to fracture  . TOE AMPUTATION     second toe  . TONSILLECTOMY    . TOTAL KNEE ARTHROPLASTY  01/11/2011   Procedure: TOTAL KNEE ARTHROPLASTY;  Surgeon: Harvie Junior;  Location: [facility] OR;  Service: Orthopedics;  Laterality: Right;  COMPUTER ASSISTED TOTAL KNEE REPLACEMENT    Social History   Social History  . Marital status: Single    Spouse name: N/A  . Number of children: N/A  . Years of education: N/A   Occupational History  . Not on file.   Social History Main Topics  . Smoking status: Former Smoker    Packs/day: 0.00    Years: 30.00    Quit date: 03/13/2015  . Smokeless tobacco: Never Used  . Alcohol  use No  . Drug use: No  . Sexual activity: Yes    Birth control/ protection: Post-menopausal   Other Topics Concern  . Not on file   Social History Narrative   Full time. Does not regularly exercise. Domestic partner.     Current Outpatient Prescriptions on File Prior to Visit  Medication Sig Dispense Refill  . albuterol (PROVENTIL HFA;VENTOLIN HFA) 108 (90 Base) MCG/ACT inhaler Inhale 2 puffs into the lungs every 4 (four) hours as needed for wheezing or shortness of breath. Reported on 03/29/2015 1 Inhaler 3  . ALPRAZolam (XANAX) 0.5 MG tablet     . aspirin EC 81 MG tablet Take by mouth.    Marland Kitchen aspirin-acetaminophen-caffeine (EXCEDRIN MIGRAINE) 250-250-65 MG tablet Take 2 tablets by mouth every 6 (six) hours as  needed for migraine.    . carvedilol (COREG) 3.125 MG tablet Take 3.125 mg by mouth 2 (two) times daily with a meal.      . diazepam (VALIUM) 10 MG tablet Take 5-10 mg by mouth every 6 (six) hours as needed for anxiety.     Marland Kitchen ibuprofen (ADVIL,MOTRIN) 800 MG tablet Take 800 mg by mouth every 8 (eight) hours as needed (for migraines).     . Insulin Glargine (LANTUS) 100 UNIT/ML Solostar Pen Inject into the skin.    Marland Kitchen insulin NPH-regular Human (NOVOLIN 70/30) (70-30) 100 UNIT/ML injection Inject 40 Units into the skin 2 (two) times daily.     . insulin NPH-regular Human (NOVOLIN 70/30) (70-30) 100 UNIT/ML injection 40 units in the am.  35 units in the pm    . lisinopril (PRINIVIL,ZESTRIL) 10 MG tablet Take 1 tablet (10 mg total) by mouth 2 (two) times daily. 60 tablet 1  . metFORMIN (GLUCOPHAGE) 500 MG tablet Take 3 tablets (1,500 mg total) by mouth 2 (two) times daily with a meal. Pt takes two tablets with breakfast and three tablets with dinner. 150 tablet 1  . pravastatin (PRAVACHOL) 40 MG tablet Take 1 tablet (40 mg total) by mouth at bedtime. 30 tablet 5  . traMADol (ULTRAM) 50 MG tablet Take 25-50 mg by mouth every 4 (four) hours as needed (for migraines).     No current facility-administered medications on file prior to visit.     Allergies  Allergen Reactions  . Shellfish Allergy Anaphylaxis  . Codeine Other (See Comments)    Migraine  . Morphine And Related Other (See Comments)    Reaction:  Severe migraines   . Percocet [Oxycodone-Acetaminophen] Nausea And Vomiting  . Penicillins Rash and Other (See Comments)    Has patient had a PCN reaction causing immediate rash, facial/tongue/throat swelling, SOB or lightheadedness with hypotension: No Has patient had a PCN reaction causing severe rash involving mucus membranes or skin necrosis: No Has patient had a PCN reaction that required hospitalization No Has patient had a PCN reaction occurring within the last 10 years: No If all of the  above answers are "NO", then may proceed with Cephalosporin use.  . Sulfa Antibiotics Rash  . Theophyllines Itching and Rash      Review of Systems ROS Review of Systems - General ROS: negative for - chills, fatigue, fever, hot flashes, night sweats, weight gain or weight loss Psychological ROS: negative for - anxiety, decreased libido, depression, mood swings, physical abuse or sexual abuse Ophthalmic ROS: negative for - blurry vision, eye pain or loss of vision ENT ROS: negative for - headaches, hearing change, visual changes or vocal changes Allergy and Immunology ROS:  negative for - hives, itchy/watery eyes or seasonal allergies Hematological and Lymphatic ROS: negative for - bleeding problems, bruising, swollen lymph nodes or weight loss Endocrine ROS: negative for - galactorrhea, hair pattern changes, hot flashes, malaise/lethargy, mood swings, palpitations, polydipsia/polyuria, skin changes, temperature intolerance or unexpected weight changes Breast ROS: negative for - new or changing breast lumps or nipple discharge Respiratory ROS: negative for - cough or shortness of breath Cardiovascular ROS: negative for - chest pain, irregular heartbeat, palpitations or shortness of breath Gastrointestinal ROS: no abdominal pain, change in bowel habits, or black or bloody stools Genito-Urinary ROS: no dysuria, trouble voiding, or hematuria Musculoskeletal ROS: negative for - joint pain or joint stiffness Neurological ROS: negative for - bowel and bladder control changes Dermatological ROS: Positive for recurrent boils on vulvar and groin regions.  Negative for rash and skin lesion changes   Objective:   BP (!) 157/84 (BP Location: Left Arm, Patient Position: Sitting, Cuff Size: Large)   Pulse 73   Ht 5\' 9"  (1.753 m)   Wt 270 lb 8 oz (122.7 kg)   BMI 39.95 kg/m  CONSTITUTIONAL: Well-developed, well-nourished female in no acute distress, moderately obese PSYCHIATRIC: Normal mood and  affect. Normal behavior. Normal judgment and thought content. NEUROLGIC: Alert and oriented to person, place, and time. Normal muscle tone coordination. No cranial nerve deficit noted. HENT:  Normocephalic, atraumatic, External right and left ear normal. Oropharynx is clear and moist EYES: Conjunctivae and EOM are normal. Pupils are equal, round, and reactive to light. No scleral icterus.  NECK: Normal range of motion, supple, no masses.  Normal thyroid.  SKIN: Skin is warm and dry. No rash noted. Not diaphoretic. No erythema. No pallor. CARDIOVASCULAR: Normal heart rate noted, regular rhythm, no murmur. RESPIRATORY: Clear to auscultation bilaterally. Effort and breath sounds normal, no problems with respiration noted. BREASTS: Symmetric in size. No masses, skin changes, nipple drainage, or lymphadenopathy. ABDOMEN: Soft, normal bowel sounds, no distention noted.  No tenderness, rebound or guarding.  BLADDER: Normal PELVIC:  Bladder no bladder distension noted  Urethra: normal appearing urethra with no masses, tenderness or lesions  Vulva: normal appearing vulva with no masses, tenderness or lesions  Vagina: normal appearing vagina with normal color and discharge, no lesions  Cervix: normal appearing cervix without discharge or lesions  Uterus: uterus is normal size, shape, consistency and nontender  Adnexa: normal adnexa in size, nontender and no masses  RV: External Exam NormaI, No Rectal Masses and Normal Sphincter tone  MUSCULOSKELETAL: Normal range of motion. No tenderness.  No cyanosis, clubbing, or edema.  2+ distal pulses. LYMPHATIC: No Axillary, Supraclavicular, or Inguinal Adenopathy.    Assessment:   Annual gynecologic examination 50 y.o. Contraception: post menopausal status Obesity 2  Recurrent vulvar boils.   Plan:  Pap: Not done.  Up to date until 2019.  Mammogram: Ordered Stool Guaiac Testing:  Not Ordered.  Colonoscopy ordered.  Labs: Lipid panel, CBC, Chem 14,  Vit D level, TSH and Hemoglobin A1C Routine preventative health maintenance measures emphasized: Exercise/Diet/Weight control, Alcohol/Substance use risks, Stress Management and Safe Sex Will refer to Dermatology for recurrent refractory boils.  Patient notes she used to have a Dermatologist whom she hasn't seen in a while.  Encouraged patient to re-establish care.  Declines flu vaccine.  Return to Clinic - 1 Year   Hildred LaserAnika Kaiyana Bedore, MD [facility]

## 2015-10-06 LAB — COMPREHENSIVE METABOLIC PANEL
A/G RATIO: 1.3 (ref 1.2–2.2)
ALK PHOS: 64 IU/L (ref 39–117)
ALT: 23 IU/L (ref 0–32)
AST: 17 IU/L (ref 0–40)
Albumin: 4 g/dL (ref 3.5–5.5)
BUN / CREAT RATIO: 27 — AB (ref 9–23)
BUN: 16 mg/dL (ref 6–24)
Bilirubin Total: 0.3 mg/dL (ref 0.0–1.2)
CALCIUM: 8.9 mg/dL (ref 8.7–10.2)
CHLORIDE: 98 mmol/L (ref 96–106)
CO2: 23 mmol/L (ref 18–29)
Creatinine, Ser: 0.6 mg/dL (ref 0.57–1.00)
GFR calc non Af Amer: 107 mL/min/{1.73_m2} (ref >59)
GFR, EST AFRICAN AMERICAN: 123 mL/min/{1.73_m2} (ref >59)
Globulin, Total: 3.1 g/dL (ref 1.5–4.5)
Glucose: 238 mg/dL — ABNORMAL HIGH (ref 65–99)
Potassium: 4.7 mmol/L (ref 3.5–5.2)
SODIUM: 137 mmol/L (ref 134–144)
Total Protein: 7.1 g/dL (ref 6.0–8.5)

## 2015-10-06 LAB — CBC
Hematocrit: 44.6 % (ref 34.0–46.6)
Hemoglobin: 14.7 g/dL (ref 11.1–15.9)
MCH: 30 pg (ref 26.6–33.0)
MCHC: 33 g/dL (ref 31.5–35.7)
MCV: 91 fL (ref 79–97)
PLATELETS: 251 10*3/uL (ref 150–379)
RBC: 4.9 x10E6/uL (ref 3.77–5.28)
RDW: 13.4 % (ref 12.3–15.4)
WBC: 6.6 10*3/uL (ref 3.4–10.8)

## 2015-10-06 LAB — TSH: TSH: 1.51 u[IU]/mL (ref 0.450–4.500)

## 2015-10-06 LAB — LIPID PANEL
CHOLESTEROL TOTAL: 132 mg/dL (ref 100–199)
Chol/HDL Ratio: 3.8 ratio units (ref 0.0–4.4)
HDL: 35 mg/dL — AB (ref >39)
LDL CALC: 44 mg/dL (ref 0–99)
TRIGLYCERIDES: 263 mg/dL — AB (ref 0–149)
VLDL CHOLESTEROL CAL: 53 mg/dL — AB (ref 5–40)

## 2015-10-06 LAB — VITAMIN D 25 HYDROXY (VIT D DEFICIENCY, FRACTURES): VIT D 25 HYDROXY: 14.7 ng/mL — AB (ref 30.0–100.0)

## 2015-10-06 LAB — HEMOGLOBIN A1C
Est. average glucose Bld gHb Est-mCnc: 260 mg/dL
Hgb A1c MFr Bld: 10.7 % — ABNORMAL HIGH (ref 4.8–5.6)

## 2015-10-06 NOTE — Addendum Note (Signed)
Addended by: Frankey ShownGLANTON, Mccoy Testa C on: 10/06/2015 10:12 AM   Modules accepted: Orders

## 2015-10-10 ENCOUNTER — Telehealth: Payer: Self-pay

## 2015-10-10 LAB — PAP IG AND HPV HIGH-RISK
HPV, HIGH-RISK: NEGATIVE
PAP SMEAR COMMENT: 0

## 2015-10-10 NOTE — Telephone Encounter (Signed)
Called pt no answer. LM for pt to call back.  

## 2015-10-10 NOTE — Telephone Encounter (Signed)
Called pt, no answer. LM for pt informing her of abnormal labs. Advised to call back for questions.

## 2015-10-10 NOTE — Telephone Encounter (Signed)
Pt called and was returning your call °

## 2015-10-10 NOTE — Telephone Encounter (Signed)
-----   Message from Hildred LaserAnika Cherry, MD sent at 10/09/2015  7:11 PM EDT ----- Please inform patient that her triglycerides are elevated. She should discuss this with her PCP and possibly be started on cholesterol meds if she is not already on them/or adjusting meds if she is on them.    Would also strongly encourage moderate activity exercise at least 30 min 5 days/week, and going to a low cholesterol diet.  Her glucose levels were also significantly elevated, meaning that her diabetes is still not under good control (HgbA1c has improved some, but not by much). Also, her Vit D levels are significantly low.  She should begin on Vit D supplementation 50,000 IU weekly x 12 weeks, then [phone] IU daily

## 2015-10-14 ENCOUNTER — Other Ambulatory Visit: Payer: Self-pay | Admitting: Family Medicine

## 2015-10-14 DIAGNOSIS — E782 Mixed hyperlipidemia: Secondary | ICD-10-CM

## 2015-10-16 ENCOUNTER — Other Ambulatory Visit: Payer: Self-pay

## 2015-10-16 DIAGNOSIS — E559 Vitamin D deficiency, unspecified: Secondary | ICD-10-CM

## 2015-10-16 MED ORDER — VITAMIN D (ERGOCALCIFEROL) 1.25 MG (50000 UNIT) PO CAPS: 50000.0000 [IU] | ORAL_CAPSULE | ORAL | 0 refills | Status: AC

## 2015-11-22 ENCOUNTER — Telehealth: Payer: Self-pay | Admitting: Obstetrics and Gynecology

## 2015-11-22 DIAGNOSIS — N6320 Unspecified lump in the left breast, unspecified quadrant: Secondary | ICD-10-CM

## 2015-11-22 NOTE — Telephone Encounter (Signed)
Orders placed.  You can schedule.

## 2015-11-22 NOTE — Telephone Encounter (Signed)
PT CALLED AND SHE STATED THAT LAST WEEK SHE NOTICED A LUMP ON HER LEFT BREAST 6:00 REGION, SHARPE PAINS RADIATING FROM IT, SORE AND TENDER SIZE ABOUT QUARTER AND ABOUT AN INCH DEEP. CAN YOU PLACE DX ORDERS FOR HER AND THEN I WILL CALL [facility] AND SCHEDULE HER APPT. LAST MAMMO WAS 2014.

## 2015-12-06 ENCOUNTER — Ambulatory Visit
Admission: RE | Admit: 2015-12-06 | Discharge: 2015-12-06 | Disposition: A | Discharge status: Home / Self Care | Payer: 59 | Source: Ambulatory Visit | Attending: Obstetrics and Gynecology | Admitting: Obstetrics and Gynecology

## 2015-12-06 ENCOUNTER — Other Ambulatory Visit: Payer: Self-pay | Admitting: Obstetrics and Gynecology

## 2015-12-06 DIAGNOSIS — N6320 Unspecified lump in the left breast, unspecified quadrant: Secondary | ICD-10-CM

## 2015-12-08 ENCOUNTER — Other Ambulatory Visit: Payer: Self-pay | Admitting: Obstetrics and Gynecology

## 2015-12-08 DIAGNOSIS — R928 Other abnormal and inconclusive findings on diagnostic imaging of breast: Secondary | ICD-10-CM

## 2016-02-17 ENCOUNTER — Other Ambulatory Visit: Payer: Self-pay | Admitting: Obstetrics and Gynecology

## 2016-02-17 DIAGNOSIS — E559 Vitamin D deficiency, unspecified: Secondary | ICD-10-CM

## 2016-05-10 ENCOUNTER — Other Ambulatory Visit: Payer: Self-pay | Admitting: Obstetrics and Gynecology

## 2016-05-10 ENCOUNTER — Ambulatory Visit
Admission: RE | Admit: 2016-05-10 | Discharge: 2016-05-10 | Disposition: A | Discharge status: Home / Self Care | Payer: Commercial Managed Care - HMO | Source: Ambulatory Visit | Attending: Obstetrics and Gynecology | Admitting: Obstetrics and Gynecology

## 2016-05-10 DIAGNOSIS — R928 Other abnormal and inconclusive findings on diagnostic imaging of breast: Secondary | ICD-10-CM | POA: Diagnosis not present

## 2016-07-02 DIAGNOSIS — R079 Chest pain, unspecified: Secondary | ICD-10-CM | POA: Insufficient documentation

## 2016-07-02 HISTORY — DX: Chest pain, unspecified: R07.9

## 2016-10-10 ENCOUNTER — Encounter: Payer: 59 | Admitting: Obstetrics and Gynecology

## 2016-10-19 ENCOUNTER — Other Ambulatory Visit: Payer: Self-pay | Admitting: Obstetrics and Gynecology

## 2016-10-21 ENCOUNTER — Telehealth: Payer: Self-pay | Admitting: Obstetrics and Gynecology

## 2016-10-21 NOTE — Telephone Encounter (Signed)
Left message on machine to call back to inform pt, physician is not in office and we will follow up with her accordingly

## 2016-10-21 NOTE — Telephone Encounter (Signed)
Patient called and stated that she needs her prescription refilled. The pharmacy stated that our office denied her medication. The patient also sated that she will be going out of town this Thursday and would like to get this medication filled before that date. No other information was disclosed. Please advise.

## 2016-10-21 NOTE — Telephone Encounter (Signed)
Dr. Valentino Saxon  Please see previous messages below. Pt is aware that you are not in office. She states she is calling requesting refills for her fluconazole.  Last filled  09/04/15 qty: 1 tab rf: 3

## 2016-10-21 NOTE — Telephone Encounter (Signed)
Patient needs refill by Thursday - she is going out of town   Her appointment was RSD to Jan 3rd for AE due to Dr Valentino Saxon being out of town 10/10/2016 and this was next available appointment for AE.

## 2016-10-22 NOTE — Telephone Encounter (Signed)
Patient has been RSd to the 0830am appointment on 11/04/2016.   She confirmed that she only needs the pills for yeast sent in before Thursday please.

## 2016-10-22 NOTE — Telephone Encounter (Signed)
LVM for patient to confirm medication refills needed and offer patient sooner appointment as per Dr Oretha Milch note.

## 2016-10-22 NOTE — Telephone Encounter (Signed)
I'm not sure how her annual appointment is scheduled all the way out until January.  I just looked at my schedule and I have an 8:00 appt on Oct 29th, as well as the week of Nov 12 is pretty much wide open.  I can refill her prescription but I know that we can move her appointment up.  We just need to find out which medication needs to be refilled as she is on many different medications. I'm assuming it's her Xanax, but I want to be sure.

## 2016-10-23 MED ORDER — FLUCONAZOLE 150 MG PO TABS: 150.0000 mg | ORAL_TABLET | Freq: Once | ORAL | 3 refills | Status: AC

## 2016-10-23 NOTE — Telephone Encounter (Signed)
LVM for patient that script sent in - she should call with any questions or concerns

## 2016-10-23 NOTE — Telephone Encounter (Signed)
Ok.  Medication was sent in.  Thanks for rescheduling her.

## 2016-11-04 ENCOUNTER — Encounter: Payer: Self-pay | Admitting: Obstetrics and Gynecology

## 2016-11-04 ENCOUNTER — Encounter: Payer: 59 | Admitting: Obstetrics and Gynecology

## 2016-11-04 ENCOUNTER — Ambulatory Visit (INDEPENDENT_AMBULATORY_CARE_PROVIDER_SITE_OTHER): Payer: 59 | Admitting: Obstetrics and Gynecology

## 2016-11-04 VITALS — BP 148/84 | HR 67 | Ht 69.0 in | Wt 262.5 lb

## 2016-11-04 DIAGNOSIS — E119 Type 2 diabetes mellitus without complications: Secondary | ICD-10-CM

## 2016-11-04 DIAGNOSIS — I10 Essential (primary) hypertension: Secondary | ICD-10-CM

## 2016-11-04 DIAGNOSIS — Z01419 Encounter for gynecological examination (general) (routine) without abnormal findings: Secondary | ICD-10-CM

## 2016-11-04 DIAGNOSIS — Z1211 Encounter for screening for malignant neoplasm of colon: Secondary | ICD-10-CM

## 2016-11-04 DIAGNOSIS — Z13220 Encounter for screening for lipoid disorders: Secondary | ICD-10-CM

## 2016-11-04 DIAGNOSIS — N950 Postmenopausal bleeding: Secondary | ICD-10-CM | POA: Diagnosis not present

## 2016-11-04 DIAGNOSIS — B372 Candidiasis of skin and nail: Secondary | ICD-10-CM

## 2016-11-04 DIAGNOSIS — Z124 Encounter for screening for malignant neoplasm of cervix: Secondary | ICD-10-CM

## 2016-11-04 MED ORDER — FLUCONAZOLE 150 MG PO TABS
150.0000 mg | ORAL_TABLET | ORAL | 1 refills | Status: DC | PRN
Start: 2016-11-04 — End: 2019-06-15

## 2016-11-04 NOTE — Patient Instructions (Signed)
Health Maintenance, Female Adopting a healthy lifestyle and getting preventive care can go a long way to promote health and wellness. Talk with your health care provider about what schedule of regular examinations is right for you. This is a good chance for you to check in with your provider about disease prevention and staying healthy. In between checkups, there are plenty of things you can do on your own. Experts have done a lot of research about which lifestyle changes and preventive measures are most likely to keep you healthy. Ask your health care provider for more information. Weight and diet Eat a healthy diet  Be sure to include plenty of vegetables, fruits, low-fat dairy products, and lean protein.  Do not eat a lot of foods high in solid fats, added sugars, or salt.  Get regular exercise. This is one of the most important things you can do for your health. ? Most adults should exercise for at least 150 minutes each week. The exercise should increase your heart rate and make you sweat (moderate-intensity exercise). ? Most adults should also do strengthening exercises at least twice a week. This is in addition to the moderate-intensity exercise.  Maintain a healthy weight  Body mass index (BMI) is a measurement that can be used to identify possible weight problems. It estimates body fat based on height and weight. Your health care provider can help determine your BMI and help you achieve or maintain a healthy weight.  For females 52 years of age and older: ? A BMI below 18.5 is considered underweight. ? A BMI of 18.5 to 24.9 is normal. ? A BMI of 25 to 29.9 is considered overweight. ? A BMI of 30 and above is considered obese.  Watch levels of cholesterol and blood lipids  You should start having your blood tested for lipids and cholesterol at 52 years of age, then have this test every 5 years.  You may need to have your cholesterol levels checked more often if: ? Your lipid or  cholesterol levels are high. ? You are older than 52 years of age. ? You are at high risk for heart disease.  Cancer screening Lung Cancer  Lung cancer screening is recommended for adults 52-58 years old who are at high risk for lung cancer because of a history of smoking.  A yearly low-dose CT scan of the lungs is recommended for people who: ? Currently smoke. ? Have quit within the past 15 years. ? Have at least a 30-pack-year history of smoking. A pack year is smoking an average of one pack of cigarettes a day for 1 year.  Yearly screening should continue until it has been 15 years since you quit.  Yearly screening should stop if you develop a health problem that would prevent you from having lung cancer treatment.  Breast Cancer  Practice breast self-awareness. This means understanding how your breasts normally appear and feel.  It also means doing regular breast self-exams. Let your health care provider know about any changes, no matter how small.  If you are in your 20s or 30s, you should have a clinical breast exam (CBE) by a health care provider every 1-3 years as part of a regular health exam.  If you are 52 or older, have a CBE every year. Also consider having a breast X-ray (mammogram) every year.  If you have a family history of breast cancer, talk to your health care provider about genetic screening.  If you are at high risk  for breast cancer, talk to your health care provider about having an MRI and a mammogram every year.  Breast cancer gene (BRCA) assessment is recommended for women who have family members with BRCA-related cancers. BRCA-related cancers include: ? Breast. ? Ovarian. ? Tubal. ? Peritoneal cancers.  Results of the assessment will determine the need for genetic counseling and BRCA1 and BRCA2 testing.  Cervical Cancer Your health care provider may recommend that you be screened regularly for cancer of the pelvic organs (ovaries, uterus, and  vagina). This screening involves a pelvic examination, including checking for microscopic changes to the surface of your cervix (Pap test). You may be encouraged to have this screening done every 3 years, beginning at age 22.  For women ages 56-65, health care providers may recommend pelvic exams and Pap testing every 3 years, or they may recommend the Pap and pelvic exam, combined with testing for human papilloma virus (HPV), every 5 years. Some types of HPV increase your risk of cervical cancer. Testing for HPV may also be done on women of any age with unclear Pap test results.  Other health care providers may not recommend any screening for nonpregnant women who are considered low risk for pelvic cancer and who do not have symptoms. Ask your health care provider if a screening pelvic exam is right for you.  If you have had past treatment for cervical cancer or a condition that could lead to cancer, you need Pap tests and screening for cancer for at least 20 years after your treatment. If Pap tests have been discontinued, your risk factors (such as having a new sexual partner) need to be reassessed to determine if screening should resume. Some women have medical problems that increase the chance of getting cervical cancer. In these cases, your health care provider may recommend more frequent screening and Pap tests.  Colorectal Cancer  This type of cancer can be detected and often prevented.  Routine colorectal cancer screening usually begins at 52 years of age and continues through 52 years of age.  Your health care provider may recommend screening at an earlier age if you have risk factors for colon cancer.  Your health care provider may also recommend using home test kits to check for hidden blood in the stool.  A small camera at the end of a tube can be used to examine your colon directly (sigmoidoscopy or colonoscopy). This is done to check for the earliest forms of colorectal  cancer.  Routine screening usually begins at age 33.  Direct examination of the colon should be repeated every 5-10 years through 52 years of age. However, you may need to be screened more often if early forms of precancerous polyps or small growths are found.  Skin Cancer  Check your skin from head to toe regularly.  Tell your health care provider about any new moles or changes in moles, especially if there is a change in a mole's shape or color.  Also tell your health care provider if you have a mole that is larger than the size of a pencil eraser.  Always use sunscreen. Apply sunscreen liberally and repeatedly throughout the day.  Protect yourself by wearing long sleeves, pants, a wide-brimmed hat, and sunglasses whenever you are outside.  Heart disease, diabetes, and high blood pressure  High blood pressure causes heart disease and increases the risk of stroke. High blood pressure is more likely to develop in: ? People who have blood pressure in the high end of  the normal range (130-139/85-89 mm Hg). ? People who are overweight or obese. ? People who are African American.  If you are 21-29 years of age, have your blood pressure checked every 3-5 years. If you are 3 years of age or older, have your blood pressure checked every year. You should have your blood pressure measured twice-once when you are at a hospital or clinic, and once when you are not at a hospital or clinic. Record the average of the two measurements. To check your blood pressure when you are not at a hospital or clinic, you can use: ? An automated blood pressure machine at a pharmacy. ? A home blood pressure monitor.  If you are between 17 years and 37 years old, ask your health care provider if you should take aspirin to prevent strokes.  Have regular diabetes screenings. This involves taking a blood sample to check your fasting blood sugar level. ? If you are at a normal weight and have a low risk for diabetes,  have this test once every three years after 52 years of age. ? If you are overweight and have a high risk for diabetes, consider being tested at a younger age or more often. Preventing infection Hepatitis B  If you have a higher risk for hepatitis B, you should be screened for this virus. You are considered at high risk for hepatitis B if: ? You were born in a country where hepatitis B is common. Ask your health care provider which countries are considered high risk. ? Your parents were born in a high-risk country, and you have not been immunized against hepatitis B (hepatitis B vaccine). ? You have HIV or AIDS. ? You use needles to inject street drugs. ? You live with someone who has hepatitis B. ? You have had sex with someone who has hepatitis B. ? You get hemodialysis treatment. ? You take certain medicines for conditions, including cancer, organ transplantation, and autoimmune conditions.  Hepatitis C  Blood testing is recommended for: ? Everyone born from [birth year] through 1966. ? Anyone with known risk factors for hepatitis C.  Sexually transmitted infections (STIs)  You should be screened for sexually transmitted infections (STIs) including gonorrhea and chlamydia if: ? You are sexually active and are younger than 52 years of age. ? You are older than 52 years of age and your health care provider tells you that you are at risk for this type of infection. ? Your sexual activity has changed since you were last screened and you are at an increased risk for chlamydia or gonorrhea. Ask your health care provider if you are at risk.  If you do not have HIV, but are at risk, it may be recommended that you take a prescription medicine daily to prevent HIV infection. This is called pre-exposure prophylaxis (PrEP). You are considered at risk if: ? You are sexually active and do not regularly use condoms or know the HIV status of your partner(s). ? You take drugs by injection. ? You are  sexually active with a partner who has HIV.  Talk with your health care provider about whether you are at high risk of being infected with HIV. If you choose to begin PrEP, you should first be tested for HIV. You should then be tested every 3 months for as long as you are taking PrEP. Pregnancy  If you are premenopausal and you may become pregnant, ask your health care provider about preconception counseling.  If you may become  pregnant, take 400 to 800 micrograms (mcg) of folic acid every day.  If you want to prevent pregnancy, talk to your health care provider about birth control (contraception). Osteoporosis and menopause  Osteoporosis is a disease in which the bones lose minerals and strength with aging. This can result in serious bone fractures. Your risk for osteoporosis can be identified using a bone density scan.  If you are 61 years of age or older, or if you are at risk for osteoporosis and fractures, ask your health care provider if you should be screened.  Ask your health care provider whether you should take a calcium or vitamin D supplement to lower your risk for osteoporosis.  Menopause may have certain physical symptoms and risks.  Hormone replacement therapy may reduce some of these symptoms and risks. Talk to your health care provider about whether hormone replacement therapy is right for you. Follow these instructions at home:  Schedule regular health, dental, and eye exams.  Stay current with your immunizations.  Do not use any tobacco products including cigarettes, chewing tobacco, or electronic cigarettes.  If you are pregnant, do not drink alcohol.  If you are breastfeeding, limit how much and how often you drink alcohol.  Limit alcohol intake to no more than 1 drink per day for nonpregnant women. One drink equals 12 ounces of beer, 5 ounces of wine, or 1 ounces of hard liquor.  Do not use street drugs.  Do not share needles.  Ask your health care  provider for help if you need support or information about quitting drugs.  Tell your health care provider if you often feel depressed.  Tell your health care provider if you have ever been abused or do not feel safe at home. This information is not intended to replace advice given to you by your health care provider. Make sure you discuss any questions you have with your health care provider. Document Released: 07/09/2010 Document Revised: 06/01/2015 Document Reviewed: 09/27/2014 Elsevier Interactive Patient Education  2018 [employer].  Fat and Cholesterol Restricted Diet High levels of fat and cholesterol in your blood may lead to various health problems, such as diseases of the heart, blood vessels, gallbladder, liver, and pancreas. Fats are concentrated sources of energy that come in various forms. Certain types of fat, including saturated fat, may be harmful in excess. Cholesterol is a substance needed by your body in small amounts. Your body makes all the cholesterol it needs. Excess cholesterol comes from the food you eat. When you have high levels of cholesterol and saturated fat in your blood, health problems can develop because the excess fat and cholesterol will gather along the walls of your blood vessels, causing them to narrow. Choosing the right foods will help you control your intake of fat and cholesterol. This will help keep the levels of these substances in your blood within normal limits and reduce your risk of disease. What is my plan? Your health care provider recommends that you:  Limit your fat intake to ______% or less of your total calories per day.  Limit the amount of cholesterol in your diet to less than _________mg per day.  Eat 20-30 grams of fiber each day.  What types of fat should I choose?  Choose healthy fats more often. Choose monounsaturated and polyunsaturated fats, such as olive and canola oil, flaxseeds, walnuts, almonds, and seeds.  Eat more  omega-3 fats. Good choices include salmon, mackerel, sardines, tuna, flaxseed oil, and ground flaxseeds. Aim to  eat fish at least two times a week.  Limit saturated fats. Saturated fats are primarily found in animal products, such as meats, butter, and cream. Plant sources of saturated fats include palm oil, palm kernel oil, and coconut oil.  Avoid foods with partially hydrogenated oils in them. These contain trans fats. Examples of foods that contain trans fats are stick margarine, some tub margarines, cookies, crackers, and other baked goods. What general guidelines do I need to follow? These guidelines for healthy eating will help you control your intake of fat and cholesterol:  Check food labels carefully to identify foods with trans fats or high amounts of saturated fat.  Fill one half of your plate with vegetables and green salads.  Fill one fourth of your plate with whole grains. Look for the word "whole" as the first word in the ingredient list.  Fill one fourth of your plate with lean protein foods.  Limit fruit to two servings a day. Choose fruit instead of juice.  Eat more foods that contain fiber, such as apples, broccoli, carrots, beans, peas, and barley.  Eat more home-cooked food and less restaurant, buffet, and fast food.  Limit or avoid alcohol.  Limit foods high in starch and sugar.  Limit fried foods.  Cook foods using methods other than frying. Baking, boiling, grilling, and broiling are all great options.  Lose weight if you are overweight. Losing just 5-10% of your initial body weight can help your overall health and prevent diseases such as diabetes and heart disease.  What foods can I eat? Grains  Whole grains, such as whole wheat or whole grain breads, crackers, cereals, and pasta. Unsweetened oatmeal, bulgur, barley, quinoa, or brown rice. Corn or whole wheat flour tortillas. Vegetables  Fresh or frozen vegetables (raw, steamed, roasted, or grilled).  Green salads. Fruits  All fresh, canned (in natural juice), or frozen fruits. Meats and other protein foods  Ground beef (85% or leaner), grass-fed beef, or beef trimmed of fat. Skinless chicken or [place]. Ground chicken or [place]. Pork trimmed of fat. All fish and seafood. Eggs. Dried beans, peas, or lentils. Unsalted nuts or seeds. Unsalted canned or dry beans. Dairy  Low-fat dairy products, such as skim or 1% milk, 2% or reduced-fat cheeses, low-fat ricotta or cottage cheese, or plain low-fat yo Fats and oils  Tub margarines without trans fats. Light or reduced-fat mayonnaise and salad dressings. Avocado. Olive, canola, sesame, or safflower oils. Natural peanut or almond butter (choose ones without added sugar and oil). The items listed above may not be a complete list of recommended foods or beverages. Contact your dietitian for more options. Foods to avoid Grains  White bread. White pasta. White rice. Cornbread. Bagels, pastries, and croissants. Crackers that contain trans fat. Vegetables  White potatoes. Corn. Creamed or fried vegetables. Vegetables in a cheese sauce. Fruits  Dried fruits. Canned fruit in light or heavy syrup. Fruit juice. Meats and other protein foods  Fatty cuts of meat. Ribs, chicken wings, bacon, sausage, bologna, salami, chitterlings, fatback, hot dogs, bratwurst, and packaged luncheon meats. Liver and organ meats. Dairy  Whole or 2% milk, cream, half-and-half, and cream cheese. Whole milk cheeses. Whole-fat or sweetened yogurt. Full-fat cheeses. Nondairy creamers and whipped toppings. Processed cheese, cheese spreads, or cheese curds. Beverages  Alcohol. Sweetened drinks (such as sodas, lemonade, and fruit drinks or punches). Fats and oils  Butter, stick margarine, lard, shortening, ghee, or bacon fat. Coconut, palm kernel, or palm oils. Sweets and desserts  Corn  syrup, sugars, honey, and molasses. Candy. Jam and jelly. Syrup. Sweetened cereals.  Cookies, pies, cakes, donuts, muffins, and ice cream. The items listed above may not be a complete list of foods and beverages to avoid. Contact your dietitian for more information. This information is not intended to replace advice given to you by your health care provider. Make sure you discuss any questions you have with your health care provider. Document Released: 12/24/2004 Document Revised: 01/14/2014 Document Reviewed: 03/24/2013 Elsevier Interactive Patient Education  2017 [employer].

## 2016-11-04 NOTE — Progress Notes (Signed)
ANNUAL PREVENTATIVE CARE GYNECOLOGY  ENCOUNTER NOTE  Subjective:       Nancy [name] is a 51 y.o. G0P0000 postmenopausal female (for almost 2 years) here for a routine annual gynecologic exam. The patient is sexually active (same sex relationship). The patient is not taking hormone replacement therapy. Patient reports post-menopausal vaginal bleeding. Notes 2 episodes (1 in May, the other in August, noted heavy bleeding for 2-3 days, bleeding ends after ~ 15 days. Also associated with cramping).  The patient wears seatbelts: yes. The patient participates in regular exercise: no. Has the patient ever been transfused or tattooed?: no. The patient reports that there is not domestic violence in her life.   Current complaints today:  1. Patient desires refill on Diflucan for recurrent yeast dermatitis in the groin region.  States that she uses the Nystatin powder, but still occasionally gets infections.  Notes that the tablets work better for her than the cream. Does not have an active infection currently.   Gynecologic History No LMP recorded. Patient is not currently having periods (Reason: Post-menopausal). Contraception: post menopausal status Last Pap: 2014. Results were: normal Last mammogram: 05/2016. Results were: normal Last Colonoscopy: Patient has never had.  Last Dexa Scan: Patient has never had.    Obstetric History OB History  Gravida Para Term Preterm AB Living  0 0 0 0 0 0  SAB TAB Ectopic Multiple Live Births  0 0 0 0 0        Past Medical History:  Diagnosis Date  . Acid reflux 05/10/2014  . Anxiety    panic attacks  . 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 Arthritis    r knee  . Asthma     tx er 08/2010  . Atherosclerosis of coronary artery 05/10/2014  . C[facility] (congestive cardiac failure) (HCC) 06/06/2008   30% EF   . Diabetes mellitus   . Eczema   . Unspecified essential hypertension    FOLLOWED BY  [facility].PREVIOUSLY SEEN BY [place].ECHO 2008     Family History  Problem Relation Age of Onset  . Transient ischemic attack Mother   . Asthma Mother   . Hypertension Mother   . Lung cancer Father   . Hypertension Father   . Asthma Sister   . Anxiety disorder Sister   . Stomach cancer Maternal Grandmother   . Colon cancer Maternal Grandmother   . Leukemia Maternal Grandmother   . Melanoma Maternal Grandfather   . Asthma Sister   . Hypertension Brother   . Cancer Other   . Crohn's disease Other   . GER disease Other   . Breast cancer Neg Hx     Past Surgical History:  Procedure Laterality Date  . AMPUTATION TOE Left 03/16/2015   Procedure: left fifthe toe amputation with incision and drainage;  Surgeon: Gwyneth Revels, DPM;  Location: [facility] ORS;  Service: Podiatry;  Laterality: Left;  . CARDIAC CATHETERIZATION     2011 followed by dr bensimohn  . CHOLECYSTECTOMY    . kidney stone removal    . KNEE ARTHROSCOPY W/ MENISCAL REPAIR    . NOSE SURGERY  1991   due to fracture  . TOE AMPUTATION     second toe  . TONSILLECTOMY    . TOTAL KNEE ARTHROPLASTY  01/11/2011   Procedure: TOTAL KNEE ARTHROPLASTY;  Surgeon: Harvie Junior;  Location: [facility] OR;  Service: Orthopedics;  Laterality: Right;  COMPUTER ASSISTED TOTAL KNEE REPLACEMENT    Social History   Social  History  . Marital status: Married    Spouse name: N/A  . Number of children: N/A  . Years of education: N/A   Occupational History  . Not on file.   Social History Main Topics  . Smoking status: Former Smoker    Packs/day: 0.00    Years: 30.00    Quit date: 03/13/2015  . Smokeless tobacco: Never Used  . Alcohol use No  . Drug use: No  . Sexual activity: Yes    Birth control/ protection: Post-menopausal   Other Topics Concern  . Not on file   Social History Narrative   Full time. Does not regularly exercise. Domestic partner.     Current Outpatient Prescriptions on File Prior to Visit  Medication Sig Dispense Refill  . albuterol (PROVENTIL HFA;VENTOLIN  HFA) 108 (90 Base) MCG/ACT inhaler Inhale 2 puffs into the lungs every 4 (four) hours as needed for wheezing or shortness of breath. Reported on 03/29/2015 1 Inhaler 3  . ALPRAZolam (XANAX) 0.5 MG tablet     . aspirin EC 81 MG tablet Take by mouth.    Marland Kitchen aspirin-acetaminophen-caffeine (EXCEDRIN MIGRAINE) 250-250-65 MG tablet Take 2 tablets by mouth every 6 (six) hours as needed for migraine.    . carvedilol (COREG) 3.125 MG tablet Take 3.125 mg by mouth 2 (two) times daily with a meal.      . diazepam (VALIUM) 10 MG tablet Take 5-10 mg by mouth every 6 (six) hours as needed for anxiety.     Marland Kitchen ibuprofen (ADVIL,MOTRIN) 800 MG tablet Take 800 mg by mouth every 8 (eight) hours as needed (for migraines).     . Insulin Glargine (LANTUS) 100 UNIT/ML Solostar Pen Inject into the skin.    Marland Kitchen insulin NPH-regular Human (NOVOLIN 70/30) (70-30) 100 UNIT/ML injection Inject 40 Units into the skin 2 (two) times daily.     . insulin NPH-regular Human (NOVOLIN 70/30) (70-30) 100 UNIT/ML injection 40 units in the am.  35 units in the pm    . lisinopril (PRINIVIL,ZESTRIL) 10 MG tablet Take 1 tablet (10 mg total) by mouth 2 (two) times daily. 60 tablet 1  . metFORMIN (GLUCOPHAGE) 500 MG tablet Take 3 tablets (1,500 mg total) by mouth 2 (two) times daily with a meal. Pt takes two tablets with breakfast and three tablets with dinner. 150 tablet 1  . ondansetron (ZOFRAN) 4 MG tablet     . pravastatin (PRAVACHOL) 40 MG tablet Take 1 tablet (40 mg total) by mouth at bedtime. 30 tablet 5  . traMADol (ULTRAM) 50 MG tablet Take 25-50 mg by mouth every 4 (four) hours as needed (for migraines).     No current facility-administered medications on file prior to visit.     Allergies  Allergen Reactions  . Shellfish Allergy Anaphylaxis  . Codeine Other (See Comments)    Migraine  . Morphine And Related Other (See Comments)    Reaction:  Severe migraines   . Percocet [Oxycodone-Acetaminophen] Nausea And Vomiting  . Penicillins  Rash and Other (See Comments)    Has patient had a PCN reaction causing immediate rash, facial/tongue/throat swelling, SOB or lightheadedness with hypotension: No Has patient had a PCN reaction causing severe rash involving mucus membranes or skin necrosis: No Has patient had a PCN reaction that required hospitalization No Has patient had a PCN reaction occurring within the last 10 years: No If all of the above answers are "NO", then may proceed with Cephalosporin use.  . Sulfa Antibiotics Rash  .  Theophyllines Itching and Rash      Review of Systems ROS Review of Systems - General ROS: negative for - chills, fatigue, fever, hot flashes, night sweats, weight gain or weight loss Psychological ROS: negative for - anxiety, decreased libido, depression, mood swings, physical abuse or sexual abuse Ophthalmic ROS: negative for - blurry vision, eye pain or loss of vision ENT ROS: negative for - headaches, hearing change, visual changes or vocal changes Allergy and Immunology ROS: negative for - hives, itchy/watery eyes or seasonal allergies Hematological and Lymphatic ROS: negative for - bleeding problems, bruising, swollen lymph nodes or weight loss Endocrine ROS: negative for - galactorrhea, hair pattern changes, hot flashes, malaise/lethargy, mood swings, palpitations, polydipsia/polyuria, skin changes, temperature intolerance or unexpected weight changes Breast ROS: negative for - new or changing breast lumps or nipple discharge Respiratory ROS: negative for - cough or shortness of breath Cardiovascular ROS: negative for - chest pain, irregular heartbeat, palpitations or shortness of breath Gastrointestinal ROS: no abdominal pain, change in bowel habits, or black or bloody stools Genito-Urinary ROS: no dysuria, trouble voiding, or hematuria Musculoskeletal ROS: negative for - joint pain or joint stiffness Neurological ROS: negative for - bowel and bladder control changes Dermatological ROS:   Negative for rash and skin lesion changes   Objective:   BP (!) 148/84 (BP Location: Right Arm, Patient Position: Sitting, Cuff Size: Large)   Pulse 67   Ht 5\' 9"  (1.753 m)   Wt 262 lb 8 oz (119.1 kg)   BMI 38.76 kg/m  CONSTITUTIONAL: Well-developed, well-nourished female in no acute distress, moderately obese PSYCHIATRIC: Normal mood and affect. Normal behavior. Normal judgment and thought content. NEUROLGIC: Alert and oriented to person, place, and time. Normal muscle tone coordination. No cranial nerve deficit noted. HENT:  Normocephalic, atraumatic, External right and left ear normal. Oropharynx is clear and moist EYES: Conjunctivae and EOM are normal. Pupils are equal, round, and reactive to light. No scleral icterus.  NECK: Normal range of motion, supple, no masses.  Normal thyroid.  SKIN: Skin is warm and dry. No rash noted. Not diaphoretic. No erythema. No pallor. CARDIOVASCULAR: Normal heart rate noted, regular rhythm, no murmur. RESPIRATORY: Clear to auscultation bilaterally. Effort and breath sounds normal, no problems with respiration noted. BREASTS: Symmetric in size. No masses, skin changes, nipple drainage, or lymphadenopathy. ABDOMEN: Soft, normal bowel sounds, no distention noted.  No tenderness, rebound or guarding.  BLADDER: Normal PELVIC:  Bladder no bladder distension noted  Urethra: normal appearing urethra with no masses, tenderness or lesions  Vulva: normal appearing vulva with no masses, tenderness or lesions  Vagina: normal appearing vagina with normal color and discharge, no lesions  Cervix: normal appearing cervix without discharge or lesions  Uterus: uterus is normal size, shape, consistency and nontender  Adnexa: normal adnexa in size, nontender and no masses  RV: External Exam NormaI, No Rectal Masses and Normal Sphincter tone  MUSCULOSKELETAL: Normal range of motion. No tenderness.  No cyanosis, clubbing, or edema.  2+ distal pulses. LYMPHATIC: No  Axillary, Supraclavicular, or Inguinal Adenopathy.    Assessment:   Annual gynecologic examination 51 y.o. Contraception: post menopausal status Obesity 2  Diabetes mellitus Hypertension Recurrent yeast dermatitis Postmenopausal bleeding  Plan:  Pap: Performed today.   Mammogram: Up to date Stool Guaiac Testing:  Not Ordered.  Colonoscopy ordered.  Labs: Lipid panel, CBC, Chem 14, Vit D level, Lipid 1 and Hemoglobin A1C Routine preventative health maintenance measures emphasized: Exercise/Diet/Weight control, Alcohol/Substance use risks, Stress Management and  Safe Sex  Declines flu vaccine.  Diabetes and Hypertension managed by PCP. Patient notes she's supposed to be seen again by them again soon.  Recurrent yeast dermatitis, desires refill on Diflucan.  Ordered. Discussed that if diabetes is not well controlled, she may be more susceptible to infections.  Postmenopausal bleeding - discussed need for further evaluation of bleeding with ultrasound, and likely endometrial biopsy.  Will schedule.  Return to Clinic - 1 Year   Hildred LaserAnika Rabia Argote, MD [facility]

## 2016-11-05 LAB — COMPREHENSIVE METABOLIC PANEL
ALK PHOS: 52 IU/L (ref 39–117)
ALT: 21 IU/L (ref 0–32)
AST: 14 IU/L (ref 0–40)
Albumin/Globulin Ratio: 1.6 (ref 1.2–2.2)
Albumin: 4.2 g/dL (ref 3.5–5.5)
BILIRUBIN TOTAL: 0.3 mg/dL (ref 0.0–1.2)
BUN/Creatinine Ratio: 23 (ref 9–23)
BUN: 16 mg/dL (ref 6–24)
CALCIUM: 9.2 mg/dL (ref 8.7–10.2)
CHLORIDE: 98 mmol/L (ref 96–106)
CO2: 23 mmol/L (ref 20–29)
Creatinine, Ser: 0.69 mg/dL (ref 0.57–1.00)
GFR calc Af Amer: 117 mL/min/{1.73_m2} (ref >59)
GFR calc non Af Amer: 101 mL/min/{1.73_m2} (ref >59)
GLOBULIN, TOTAL: 2.7 g/dL (ref 1.5–4.5)
Glucose: 290 mg/dL — ABNORMAL HIGH (ref 65–99)
Potassium: 4.9 mmol/L (ref 3.5–5.2)
SODIUM: 134 mmol/L (ref 134–144)
Total Protein: 6.9 g/dL (ref 6.0–8.5)

## 2016-11-05 LAB — ESTRADIOL: Estradiol: <5 pg/mL

## 2016-11-05 LAB — CBC
HEMATOCRIT: 46.1 % (ref 34.0–46.6)
Hemoglobin: 14.8 g/dL (ref 11.1–15.9)
MCH: 30.5 pg (ref 26.6–33.0)
MCHC: 32.1 g/dL (ref 31.5–35.7)
MCV: 95 fL (ref 79–97)
PLATELETS: 265 10*3/uL (ref 150–379)
RBC: 4.85 x10E6/uL (ref 3.77–5.28)
RDW: 13.3 % (ref 12.3–15.4)
WBC: 8.3 10*3/uL (ref 3.4–10.8)

## 2016-11-05 LAB — HEMOGLOBIN A1C
Est. average glucose Bld gHb Est-mCnc: 303 mg/dL
Hgb A1c MFr Bld: 12.2 % — ABNORMAL HIGH (ref 4.8–5.6)

## 2016-11-05 LAB — PROGESTERONE: Progesterone: <0.1 ng/mL

## 2016-11-05 LAB — FSH/LH
FSH: 37.5 m[IU]/mL
LH: 25 m[IU]/mL

## 2016-11-05 LAB — LIPID PANEL
CHOL/HDL RATIO: 4.9 ratio — AB (ref 0.0–4.4)
CHOLESTEROL TOTAL: 146 mg/dL (ref 100–199)
HDL: 30 mg/dL — AB (ref >39)
LDL CALC: 50 mg/dL (ref 0–99)
TRIGLYCERIDES: 330 mg/dL — AB (ref 0–149)
VLDL CHOLESTEROL CAL: 66 mg/dL — AB (ref 5–40)

## 2016-11-07 LAB — IGP, COBASHPV16/18
HPV 16: NEGATIVE
HPV 18: NEGATIVE
HPV other hr types: NEGATIVE
PAP Smear Comment: 0

## 2016-11-08 ENCOUNTER — Telehealth: Payer: Self-pay | Admitting: Obstetrics and Gynecology

## 2016-11-08 NOTE — Telephone Encounter (Signed)
Spoke with pt about her lab results and she agreed to the appt and u/s per Dr. Valentino Saxonherry.   Per pt she needs the earliest appts we have due to work    Notes recorded by Hildred Laserherry, Anika, MD on 11/07/2016 at 12:10 AM EDT Please inform patient of the following results:  1. Her HgbA1c has increased since last year. It is strongly encouraged that she follow up with a PCP for her diabetes as it is very uncontrolled. If she does not have a PCP we can refer her to one.  2. Her cholesterol levels are also elevated (increased since last year). Her cardiovascular risk (risk of heart attack and stroke) is also increasing because of this and her uncontrolled DM.  3. Her hormone levels do show that she has entered menopause, so her bleeding is considered abnormal. She will need to return to the office for an ultrasound and follow up with me for further evaluation. Please have her scheduled for an appointment.

## 2017-01-09 ENCOUNTER — Encounter: Payer: 59 | Admitting: Obstetrics and Gynecology

## 2017-01-31 ENCOUNTER — Encounter: Payer: Self-pay | Admitting: *Deleted

## 2017-03-04 DIAGNOSIS — E1165 Type 2 diabetes mellitus with hyperglycemia: Secondary | ICD-10-CM | POA: Diagnosis not present

## 2017-03-04 DIAGNOSIS — Z79899 Other long term (current) drug therapy: Secondary | ICD-10-CM | POA: Diagnosis not present

## 2017-03-04 DIAGNOSIS — I10 Essential (primary) hypertension: Secondary | ICD-10-CM | POA: Diagnosis not present

## 2017-03-04 DIAGNOSIS — E782 Mixed hyperlipidemia: Secondary | ICD-10-CM | POA: Diagnosis not present

## 2017-03-04 DIAGNOSIS — Z794 Long term (current) use of insulin: Secondary | ICD-10-CM | POA: Diagnosis not present

## 2017-03-04 DIAGNOSIS — E1142 Type 2 diabetes mellitus with diabetic polyneuropathy: Secondary | ICD-10-CM | POA: Diagnosis not present

## 2017-08-20 ENCOUNTER — Other Ambulatory Visit: Payer: Self-pay | Admitting: Obstetrics and Gynecology

## 2017-08-27 NOTE — Telephone Encounter (Signed)
Called pt to speak with her more concerning a medication refill. AC wants to know if she is continually having issues that she needs come into the office to make an appointment so [facility] the infection.

## 2017-09-01 MED ORDER — MICONAZOLE NITRATE 2 % POWD: 200.0000 mg | Freq: Two times a day (BID) | 1 refills | Status: DC | PRN

## 2017-09-01 MED ORDER — NYSTATIN 100000 UNIT/GM EX POWD: Freq: Four times a day (QID) | CUTANEOUS | 0 refills | Status: DC

## 2017-09-01 NOTE — Telephone Encounter (Signed)
Pt called no answer LM via voicemail to informed [facility] hold out on using diflucan so much due to the side effect of liver issues. AC also wanted to know the name of the powder she was taking that helped and was going to refill it for the pt.

## 2017-09-19 DIAGNOSIS — I10 Essential (primary) hypertension: Secondary | ICD-10-CM | POA: Diagnosis not present

## 2017-09-19 DIAGNOSIS — Z79899 Other long term (current) drug therapy: Secondary | ICD-10-CM | POA: Diagnosis not present

## 2017-09-19 DIAGNOSIS — Z794 Long term (current) use of insulin: Secondary | ICD-10-CM | POA: Diagnosis not present

## 2017-09-19 DIAGNOSIS — E782 Mixed hyperlipidemia: Secondary | ICD-10-CM | POA: Diagnosis not present

## 2017-09-19 DIAGNOSIS — E1142 Type 2 diabetes mellitus with diabetic polyneuropathy: Secondary | ICD-10-CM | POA: Diagnosis not present

## 2017-09-19 DIAGNOSIS — Z79891 Long term (current) use of opiate analgesic: Secondary | ICD-10-CM | POA: Diagnosis not present

## 2017-09-25 DIAGNOSIS — I10 Essential (primary) hypertension: Secondary | ICD-10-CM | POA: Diagnosis not present

## 2017-09-25 DIAGNOSIS — I5181 Takotsubo syndrome: Secondary | ICD-10-CM | POA: Diagnosis not present

## 2017-10-18 DIAGNOSIS — Z23 Encounter for immunization: Secondary | ICD-10-CM | POA: Diagnosis not present

## 2017-11-26 ENCOUNTER — Other Ambulatory Visit: Payer: Self-pay | Admitting: Obstetrics and Gynecology

## 2017-11-26 DIAGNOSIS — N6320 Unspecified lump in the left breast, unspecified quadrant: Secondary | ICD-10-CM

## 2017-12-16 DIAGNOSIS — E1169 Type 2 diabetes mellitus with other specified complication: Secondary | ICD-10-CM | POA: Diagnosis not present

## 2017-12-16 DIAGNOSIS — J40 Bronchitis, not specified as acute or chronic: Secondary | ICD-10-CM | POA: Diagnosis not present

## 2017-12-16 DIAGNOSIS — J4521 Mild intermittent asthma with (acute) exacerbation: Secondary | ICD-10-CM | POA: Diagnosis not present

## 2017-12-29 ENCOUNTER — Ambulatory Visit
Admission: RE | Admit: 2017-12-29 | Discharge: 2017-12-29 | Disposition: A | Discharge status: Home / Self Care | Payer: Commercial Managed Care - HMO | Source: Ambulatory Visit | Attending: Obstetrics and Gynecology | Admitting: Obstetrics and Gynecology

## 2017-12-29 DIAGNOSIS — N6489 Other specified disorders of breast: Secondary | ICD-10-CM | POA: Diagnosis not present

## 2017-12-29 DIAGNOSIS — N6320 Unspecified lump in the left breast, unspecified quadrant: Secondary | ICD-10-CM | POA: Diagnosis not present

## 2017-12-29 DIAGNOSIS — R928 Other abnormal and inconclusive findings on diagnostic imaging of breast: Secondary | ICD-10-CM | POA: Diagnosis not present

## 2018-04-02 ENCOUNTER — Encounter: Payer: 59 | Admitting: Obstetrics and Gynecology

## 2018-04-13 DIAGNOSIS — J4520 Mild intermittent asthma, uncomplicated: Secondary | ICD-10-CM | POA: Diagnosis not present

## 2018-04-13 DIAGNOSIS — I5181 Takotsubo syndrome: Secondary | ICD-10-CM | POA: Diagnosis not present

## 2018-04-13 DIAGNOSIS — I10 Essential (primary) hypertension: Secondary | ICD-10-CM | POA: Diagnosis not present

## 2018-04-13 DIAGNOSIS — Z79899 Other long term (current) drug therapy: Secondary | ICD-10-CM | POA: Diagnosis not present

## 2018-04-20 DIAGNOSIS — R0602 Shortness of breath: Secondary | ICD-10-CM | POA: Diagnosis not present

## 2018-04-20 DIAGNOSIS — R609 Edema, unspecified: Secondary | ICD-10-CM | POA: Diagnosis not present

## 2018-04-20 DIAGNOSIS — I5181 Takotsubo syndrome: Secondary | ICD-10-CM | POA: Diagnosis not present

## 2018-04-27 DIAGNOSIS — R609 Edema, unspecified: Secondary | ICD-10-CM

## 2018-04-27 DIAGNOSIS — I5181 Takotsubo syndrome: Secondary | ICD-10-CM | POA: Diagnosis not present

## 2018-04-27 DIAGNOSIS — R600 Localized edema: Secondary | ICD-10-CM

## 2018-04-27 DIAGNOSIS — E782 Mixed hyperlipidemia: Secondary | ICD-10-CM | POA: Diagnosis not present

## 2018-04-27 DIAGNOSIS — I10 Essential (primary) hypertension: Secondary | ICD-10-CM | POA: Diagnosis not present

## 2018-04-27 HISTORY — DX: Localized edema: R60.0

## 2018-04-27 HISTORY — DX: Edema, unspecified: R60.9

## 2018-05-08 ENCOUNTER — Encounter: Payer: 59 | Admitting: Obstetrics and Gynecology

## 2018-09-21 DIAGNOSIS — R9439 Abnormal result of other cardiovascular function study: Secondary | ICD-10-CM | POA: Insufficient documentation

## 2018-09-21 HISTORY — DX: Abnormal result of other cardiovascular function study: R94.39

## 2018-09-24 ENCOUNTER — Other Ambulatory Visit: Payer: Self-pay | Admitting: *Deleted

## 2018-09-24 DIAGNOSIS — I25119 Atherosclerotic heart disease of native coronary artery with unspecified angina pectoris: Secondary | ICD-10-CM

## 2018-10-06 ENCOUNTER — Telehealth (HOSPITAL_COMMUNITY): Payer: Self-pay | Admitting: Emergency Medicine

## 2018-10-06 NOTE — Telephone Encounter (Signed)
Left message on voicemail with name and callback number Amariya Liskey RN Navigator [facility] [phone] Office [phone] Cell  

## 2018-10-08 ENCOUNTER — Other Ambulatory Visit: Payer: Self-pay

## 2018-10-08 ENCOUNTER — Ambulatory Visit (HOSPITAL_COMMUNITY)
Admission: RE | Admit: 2018-10-08 | Discharge: 2018-10-08 | Disposition: A | Discharge status: Home / Self Care | Payer: 59 | Source: Ambulatory Visit | Attending: Cardiology | Admitting: Cardiology

## 2018-10-08 DIAGNOSIS — I25119 Atherosclerotic heart disease of native coronary artery with unspecified angina pectoris: Secondary | ICD-10-CM | POA: Diagnosis present

## 2018-10-08 LAB — POCT I-STAT CREATININE: Creatinine, Ser: 0.6 mg/dL (ref 0.44–1.00)

## 2018-10-08 MED ORDER — IOHEXOL 350 MG/ML SOLN
80.0000 mL | Freq: Once | INTRAVENOUS | Status: AC | PRN
  Administered 2018-10-08: 80 mL via INTRAVENOUS

## 2018-10-08 MED ORDER — NITROGLYCERIN 0.4 MG SL SUBL
0.8000 mg | SUBLINGUAL_TABLET | Freq: Once | SUBLINGUAL | Status: AC
  Administered 2018-10-08: 0.8 mg via SUBLINGUAL
  Filled 2018-10-08: qty 25

## 2018-10-08 MED ORDER — NITROGLYCERIN 0.4 MG SL SUBL
SUBLINGUAL_TABLET | SUBLINGUAL | Status: AC
  Filled 2018-10-08: qty 2

## 2018-10-08 NOTE — Progress Notes (Signed)
Ct complete. Patient reports of headache. Informed patient to use caffeine to help with headache. Offered patient snack and beverage.

## 2018-10-09 ENCOUNTER — Ambulatory Visit (HOSPITAL_COMMUNITY)
Admission: RE | Admit: 2018-10-09 | Discharge: 2018-10-09 | Disposition: A | Discharge status: Home / Self Care | Payer: 59 | Source: Ambulatory Visit | Attending: Cardiology | Admitting: Cardiology

## 2018-10-09 DIAGNOSIS — I25119 Atherosclerotic heart disease of native coronary artery with unspecified angina pectoris: Secondary | ICD-10-CM

## 2018-10-12 DIAGNOSIS — I25119 Atherosclerotic heart disease of native coronary artery with unspecified angina pectoris: Secondary | ICD-10-CM | POA: Diagnosis not present

## 2019-06-15 ENCOUNTER — Other Ambulatory Visit: Payer: Self-pay

## 2019-06-15 ENCOUNTER — Encounter: Payer: Self-pay | Admitting: Emergency Medicine

## 2019-06-15 ENCOUNTER — Ambulatory Visit
Admission: EM | Admit: 2019-06-15 | Discharge: 2019-06-15 | Disposition: A | Discharge status: Home / Self Care | Payer: 59 | Attending: Emergency Medicine | Admitting: Emergency Medicine

## 2019-06-15 DIAGNOSIS — R0602 Shortness of breath: Secondary | ICD-10-CM | POA: Diagnosis not present

## 2019-06-15 DIAGNOSIS — Z5329 Procedure and treatment not carried out because of patient's decision for other reasons: Secondary | ICD-10-CM | POA: Diagnosis not present

## 2019-06-15 DIAGNOSIS — I10 Essential (primary) hypertension: Secondary | ICD-10-CM

## 2019-06-15 NOTE — ED Triage Notes (Signed)
Patient c/o cough and shortness of breath that started 3 days ago/ She states she has had to sit up the last 2 nights to be able to breath. States she checked her oxygen and it was 89%.

## 2019-06-15 NOTE — ED Provider Notes (Signed)
HPI  SUBJECTIVE:  Nancy [name] is a 54 y.o. female who presents with 3 days of malaise, cough productive of white phlegm, shortness of breath, dyspnea on exertion to 10 feet.  She states that she is unable to lie flat, with 4 pillow orthopnea, and has to sit up straight to breathe for the past 2 nights.  Measured her pulse ox last night and states it was 89% on room air.  She reports wheezing, chest soreness secondary to the cough.  No body aches, headaches, nasal congestion, loss of smell or taste.  No nausea, vomiting, diarrhea, abdominal pain.  No known Covid exposure.  She has received both doses of the Covid vaccine.  No lower extremity edema, unintentional weight gain.  States that she has actually lost weight.  No nocturia.  She has been using her albuterol inhaler every 2 to 4 hours, has also tried over-the-counter cough medications and Mucinex without improvement in her symptoms.  Symptoms are worse with exertion and with lying down.  She has a past medical history of asthma, Takotsubo syndrome, CHF, hypertension, diabetes.  She does not know what her blood pressure has been running at home recently, states that she took her medications earlier today.  PMD: Dr. Collene Schlichter at [facility]   Past Medical History:  Diagnosis Date  . Acid reflux 05/10/2014  . Anxiety    panic attacks  . 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 Arthritis    r knee  . Asthma     tx er 08/2010  . Atherosclerosis of coronary artery 05/10/2014  . CCF (congestive cardiac failure) (HCC) 06/06/2008   30% EF   . Diabetes mellitus   . Eczema   . Unspecified essential hypertension    FOLLOWED BY  [facility].PREVIOUSLY SEEN BY [place].ECHO 2008    Past Surgical History:  Procedure Laterality Date  . AMPUTATION TOE Left 03/16/2015   Procedure: left fifthe toe amputation with incision and drainage;  Surgeon: Gwyneth Revels, DPM;  Location: [facility] ORS;  Service: Podiatry;  Laterality: Left;  . CARDIAC  CATHETERIZATION     2011 followed by dr bensimohn  . CHOLECYSTECTOMY    . kidney stone removal    . KNEE ARTHROSCOPY W/ MENISCAL REPAIR    . NOSE SURGERY  1991   due to fracture  . TOE AMPUTATION     second toe  . TONSILLECTOMY    . TOTAL KNEE ARTHROPLASTY  01/11/2011   Procedure: TOTAL KNEE ARTHROPLASTY;  Surgeon: Harvie Junior;  Location: [facility] OR;  Service: Orthopedics;  Laterality: Right;  COMPUTER ASSISTED TOTAL KNEE REPLACEMENT    Family History  Problem Relation Age of Onset  . Transient ischemic attack Mother   . Asthma Mother   . Hypertension Mother   . Lung cancer Father   . Hypertension Father   . Asthma Sister   . Anxiety disorder Sister   . Stomach cancer Maternal Grandmother   . Colon cancer Maternal Grandmother   . Leukemia Maternal Grandmother   . Melanoma Maternal Grandfather   . Asthma Sister   . Hypertension Brother   . Cancer Other   . Crohn's disease Other   . GER disease Other   . Breast cancer Neg Hx     Social History   Tobacco Use  . Smoking status: Former Smoker    Packs/day: 0.00    Years: 30.00    Pack years: 0.00    Quit date: 03/13/2015    Years since quitting: 4.2  .  Smokeless tobacco: Never Used  Substance Use Topics  . Alcohol use: No  . Drug use: No    No current facility-administered medications for this encounter.  Current Outpatient Medications:  .  albuterol (PROVENTIL HFA;VENTOLIN HFA) 108 (90 Base) MCG/ACT inhaler, Inhale 2 puffs into the lungs every 4 (four) hours as needed for wheezing or shortness of breath. Reported on 03/29/2015, Disp: 1 Inhaler, Rfl: 3 .  aspirin EC 81 MG tablet, Take by mouth., Disp: , Rfl:  .  carvedilol (COREG) 3.125 MG tablet, Take 3.125 mg by mouth 2 (two) times daily with a meal.  , Disp: , Rfl:  .  Insulin Glargine (LANTUS) 100 UNIT/ML Solostar Pen, Inject into the skin., Disp: , Rfl:  .  insulin NPH-regular Human (NOVOLIN 70/30) (70-30) 100 UNIT/ML injection, Inject 40 Units into the skin 2 (two)  times daily. , Disp: , Rfl:  .  lisinopril (PRINIVIL,ZESTRIL) 10 MG tablet, Take 1 tablet (10 mg total) by mouth 2 (two) times daily., Disp: 60 tablet, Rfl: 1 .  metFORMIN (GLUCOPHAGE) 500 MG tablet, Take 3 tablets (1,500 mg total) by mouth 2 (two) times daily with a meal. Pt takes two tablets with breakfast and three tablets with dinner., Disp: 150 tablet, Rfl: 1 .  nystatin (MYCOSTATIN/NYSTOP) powder, Apply topically 4 (four) times daily., Disp: 15 g, Rfl: 0 .  pravastatin (PRAVACHOL) 40 MG tablet, Take 1 tablet (40 mg total) by mouth at bedtime., Disp: 30 tablet, Rfl: 5 .  traMADol (ULTRAM) 50 MG tablet, Take 25-50 mg by mouth every 4 (four) hours as needed (for migraines)., Disp: , Rfl:  .  aspirin-acetaminophen-caffeine (EXCEDRIN MIGRAINE) 250-250-65 MG tablet, Take 2 tablets by mouth every 6 (six) hours as needed for migraine., Disp: , Rfl:  .  ibuprofen (ADVIL,MOTRIN) 800 MG tablet, Take 800 mg by mouth every 8 (eight) hours as needed (for migraines). , Disp: , Rfl:  .  insulin NPH-regular Human (NOVOLIN 70/30) (70-30) 100 UNIT/ML injection, 40 units in the am.  35 units in the pm, Disp: , Rfl:  .  Miconazole Nitrate 2 % POWD, 200 mg by Does not apply route 2 (two) times daily as needed., Disp: 100 g, Rfl: 1 .  ondansetron (ZOFRAN) 4 MG tablet, , Disp: , Rfl:   Allergies  Allergen Reactions  . Shellfish Allergy Anaphylaxis  . Codeine Other (See Comments)    Migraine  . Morphine And Related Other (See Comments)    Reaction:  Severe migraines   . Percocet [Oxycodone-Acetaminophen] Nausea And Vomiting  . Penicillins Rash and Other (See Comments)    Has patient had a PCN reaction causing immediate rash, facial/tongue/throat swelling, SOB or lightheadedness with hypotension: No Has patient had a PCN reaction causing severe rash involving mucus membranes or skin necrosis: No Has patient had a PCN reaction that required hospitalization No Has patient had a PCN reaction occurring within the  last 10 years: No If all of the above answers are "NO", then may proceed with Cephalosporin use.  . Sulfa Antibiotics Rash  . Theophyllines Itching and Rash     ROS  As noted in HPI.   Physical Exam  BP (!) 208/125 (BP Location: Right Arm)   Pulse 77   Temp 99.3 F (37.4 C) (Oral)   Resp (!) 24   Ht 5\' 9"  (1.753 m)   Wt 102.1 kg   SpO2 95%   BMI 33.23 kg/m   Constitutional: Well developed, well nourished, dyspneic.  Speaking in short sentences. Eyes:  EOMI, conjunctiva normal bilaterally HENT: Normocephalic, atraumatic,mucus membranes moist Respiratory: Increased inspiratory effort.  Poor air movement.  Wheezing throughout all lung fields Cardiovascular: Normal rate, regular rhythm no murmurs rubs or gallops GI: nondistended skin: No rash, skin intact Musculoskeletal: Calves symmetric, nontender, no edema Neurologic: Alert & oriented x 3, no focal neuro deficits Psychiatric: Speech and behavior appropriate   ED Course   Medications - No data to display  Orders Placed This Encounter  Procedures  . ED EKG    Standing Status:   Standing    Number of Occurrences:   1    Order Specific Question:   Reason for Exam    Answer:   Shortness of breath  . EKG 12-Lead    Standing Status:   Standing    Number of Occurrences:   1    No results found for this or any previous visit (from the past 24 hour(s)). No results found.  ED Clinical Impression  1. Shortness of breath   2. Accelerated hypertension   3. Left against medical advice      ED Assessment/Plan  Blood pressure noted.  Patient desaturates to 91% with walking.  Concern for hypertensive emergency, acute congestive heart failure, severe asthma exacerbation, Covid infection, pneumonia.  Discussed with patient that I think she needs to go via EMS for monitoring and possible bronchodilators on route.  Patient states that she is going only to the [facility] emergency department.  Discussed with her several  times that I was extremely worried about her and about impending respiratory fatigue/failure, and recommended EMS.  Patient states that she does not need to go via EMS,.  She is to sign out AMA.  Will notify the [facility] emergency department of patient's arrival. D/w charge RN.    No orders of the defined types were placed in this encounter.   *This clinic note was created using Dragon dictation software. Therefore, there may be occasional mistakes despite careful proofreading.   ?    Melynda Ripple, MD 06/15/19 1811

## 2019-06-15 NOTE — Discharge Instructions (Addendum)
Go immediately to the [facility] emergency department.  I have notified them to let them know that you are on your way.

## 2019-06-15 NOTE — ED Triage Notes (Signed)
Patient is being discharged from the Urgent Care and sent to the Emergency Department via EMS . Per Dr. Chaney Malling, patient is in need of higher level of care due to SOB. Patient is aware and verbalizes understanding of plan of care. Patient refused transport via EMS and signed out AMA. Vitals:   06/15/19 1720  BP: (!) 208/125  Pulse: 77  Resp: (!) 24  Temp: 99.3 F (37.4 C)  SpO2: 95%

## 2019-09-08 IMAGING — MG DIGITAL DIAGNOSTIC BILATERAL MAMMOGRAM WITH TOMO AND CAD
6 of 12 series · 6 of 36 positions shown · non-contrast
Comparison: Previous exam(s).

CLINICAL DATA: Two year follow-up of probably benign sebaceous cyst
in the left 6:30 o'clock breast and a probably reactive left lower
axillary lymph node. The patient has had a surgical excision of the
previously described sebaceous cyst in the lower left breast.

EXAM:
DIGITAL DIAGNOSTIC BILATERAL MAMMOGRAM WITH CAD AND TOMO
ULTRASOUND LEFT BREAST

[L MLO synth-2D]
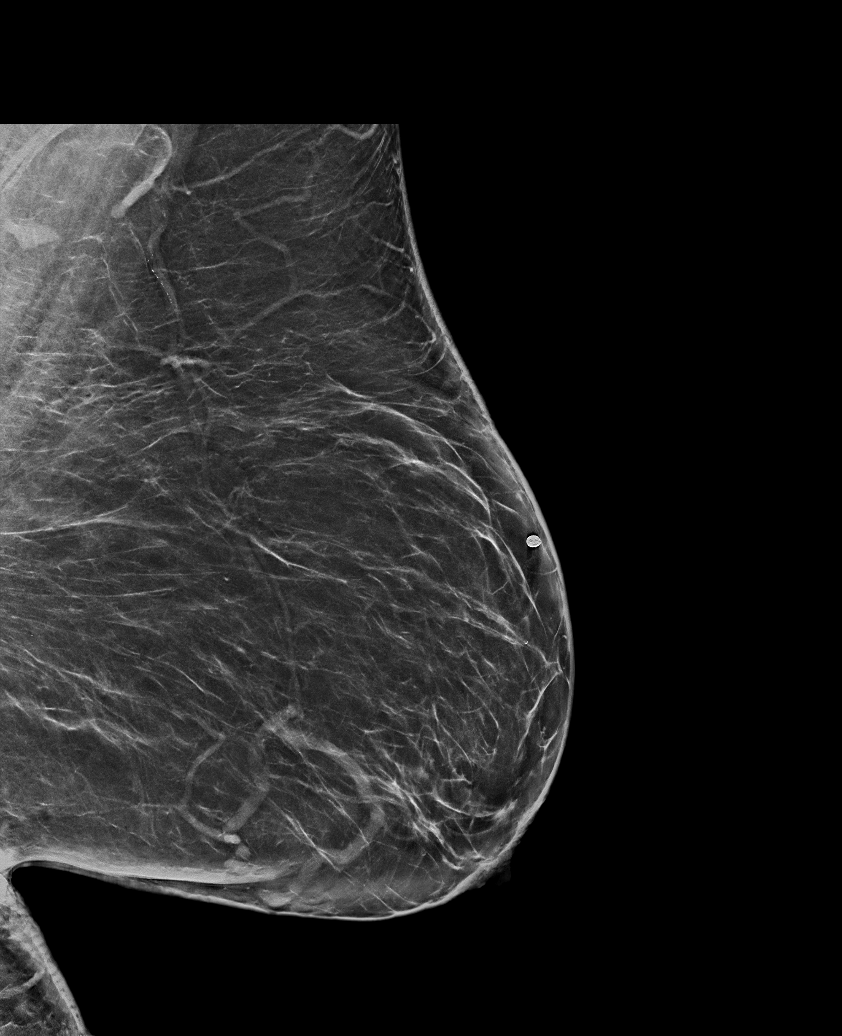

[R MLO synth-2D]
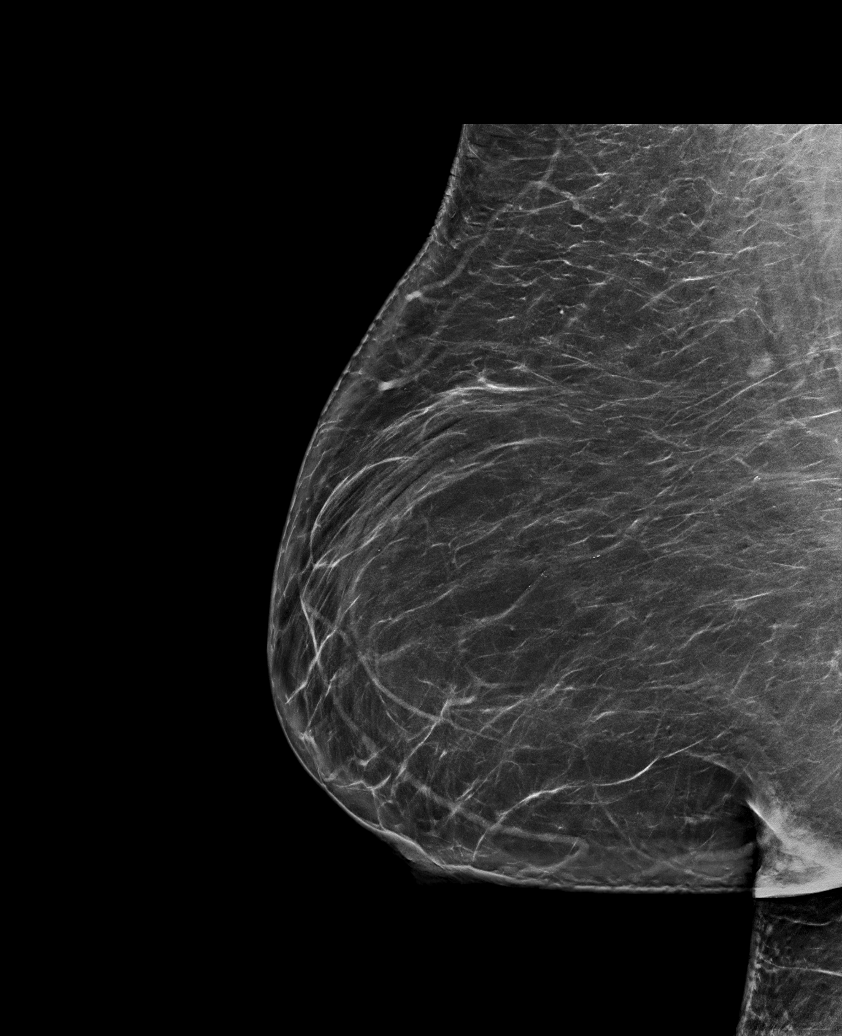

[R CC synth-2D]
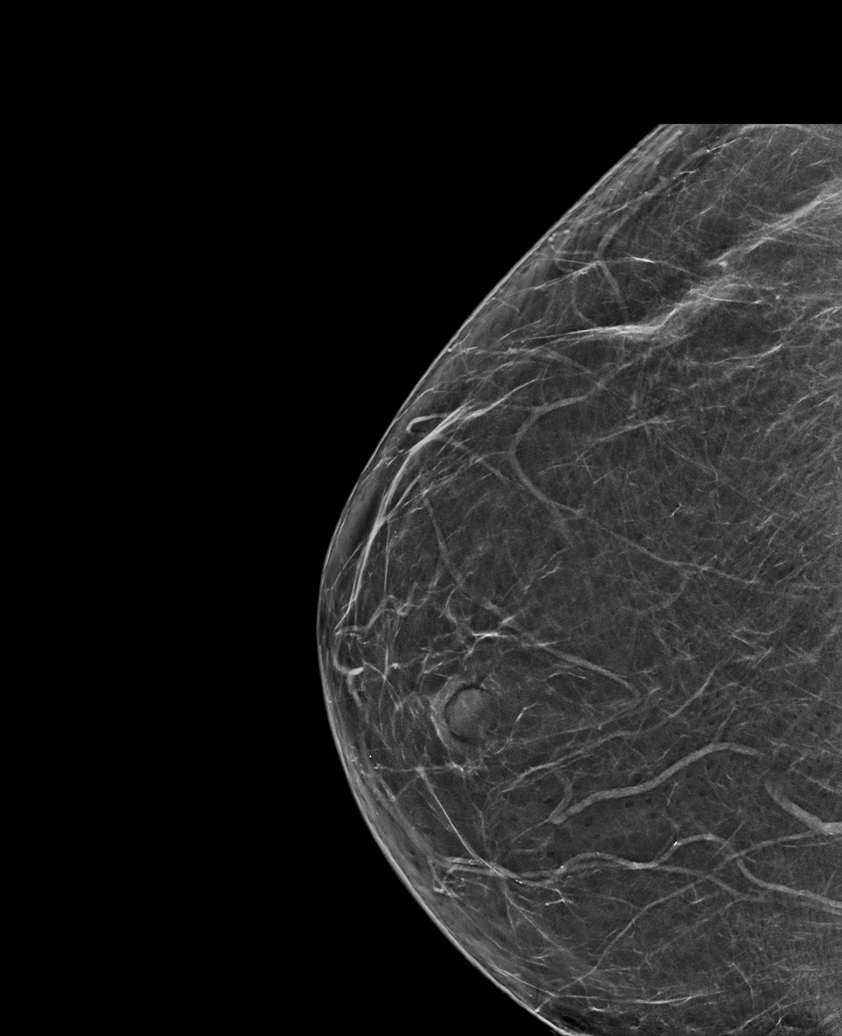

[L CV synth-2D]
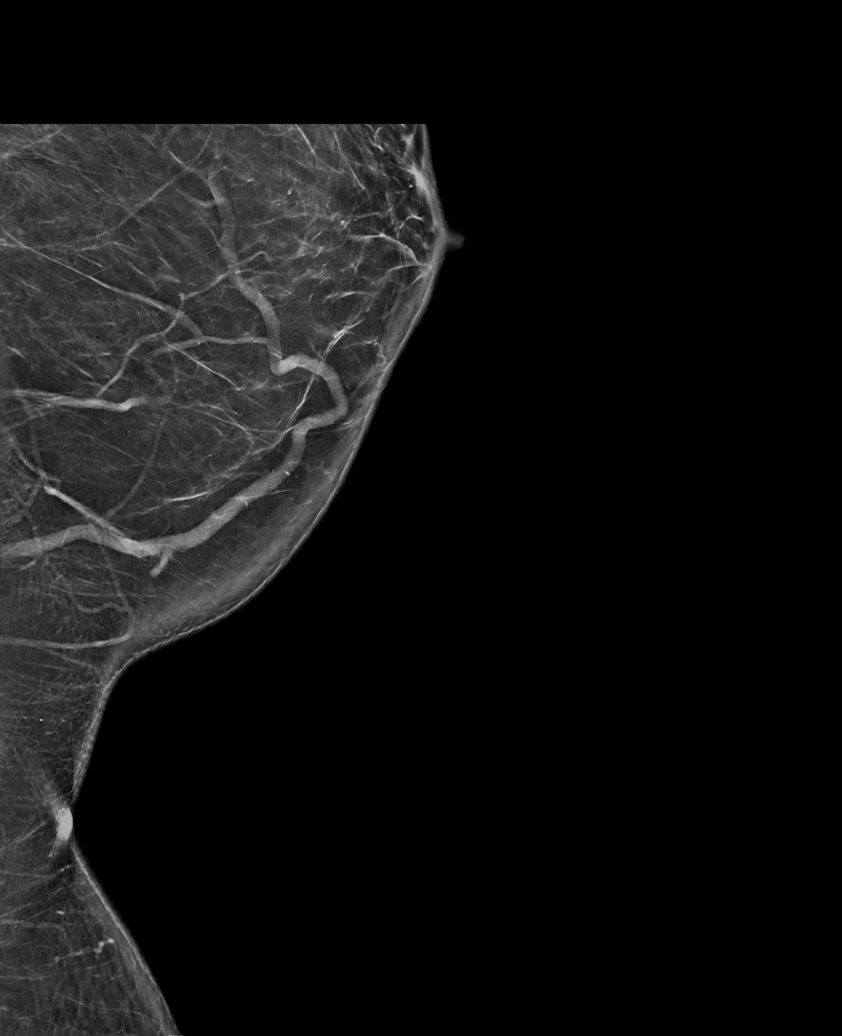

[L CC synth-2D]
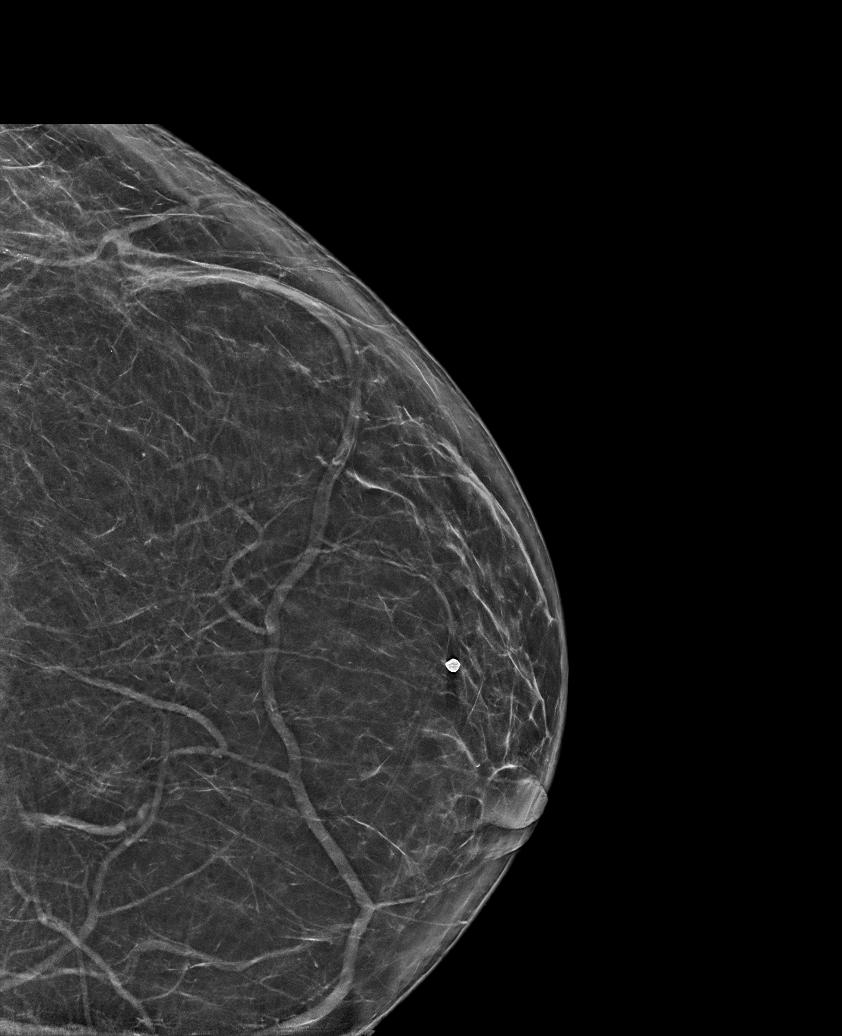

[R CV synth-2D]
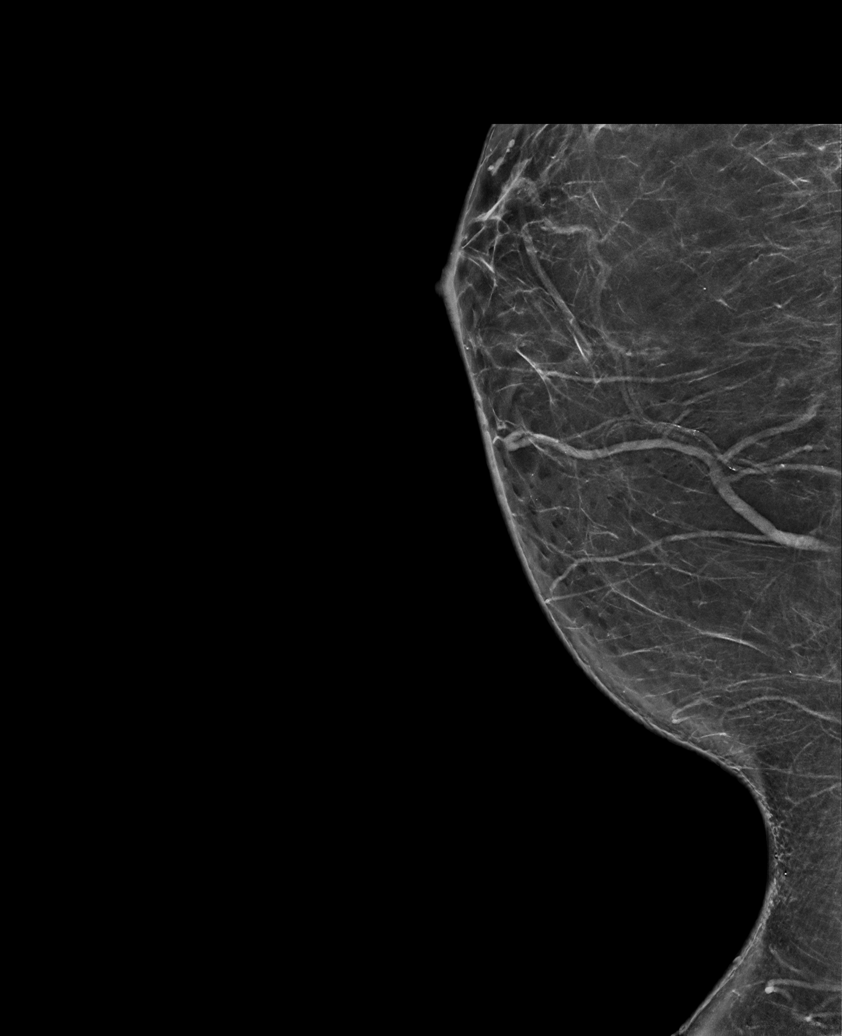

[6 of 36 positions shown; findings below may reference images not displayed]

ACR Breast Density Category b: There are scattered areas of
fibroglandular density.
FINDINGS: Mammographically, there are no suspicious masses, areas of
architectural distortion or microcalcifications in either breast.
There is near complete resolution of previously seen subcutaneous
focal asymmetry in the left lower central breast, which was thought
to represent a sebaceous cyst. No evidence of lymphadenopathy.

Mammographic images were processed with CAD.

On physical exam, no suspicious masses are palpated. Postsurgical
scarring and dimpling from prior excision of left breast sebaceous
cyst in the 6:30 o'clock position.

Targeted ultrasound is performed, showing no suspicious masses or
shadowing lesions. No evidence of lymphadenopathy in the lower left
axilla or axillary tail of the left breast.
IMPRESSION: No mammographic or sonographic evidence of malignancy in either
breast.

RECOMMENDATION:
Screening mammogram in one year.(Code:6E-[id number])

I have discussed the findings and recommendations with the patient.
Results were also provided in writing at the conclusion of the
visit. If applicable, a reminder letter will be sent to the patient
regarding the next appointment.

BI-RADS CATEGORY  2: Benign.

## 2019-10-25 ENCOUNTER — Other Ambulatory Visit: Payer: Self-pay | Admitting: Nurse Practitioner

## 2019-10-25 DIAGNOSIS — Z1231 Encounter for screening mammogram for malignant neoplasm of breast: Secondary | ICD-10-CM

## 2020-03-02 ENCOUNTER — Ambulatory Visit
Admission: RE | Admit: 2020-03-02 | Discharge: 2020-03-02 | Disposition: A | Discharge status: Home / Self Care | Payer: 59 | Source: Ambulatory Visit | Attending: Nurse Practitioner | Admitting: Nurse Practitioner

## 2020-03-02 ENCOUNTER — Other Ambulatory Visit: Payer: Self-pay

## 2020-03-02 DIAGNOSIS — Z1231 Encounter for screening mammogram for malignant neoplasm of breast: Secondary | ICD-10-CM | POA: Diagnosis not present

## 2021-01-30 ENCOUNTER — Ambulatory Visit (INDEPENDENT_AMBULATORY_CARE_PROVIDER_SITE_OTHER): Payer: 59

## 2021-01-30 ENCOUNTER — Other Ambulatory Visit (INDEPENDENT_AMBULATORY_CARE_PROVIDER_SITE_OTHER): Payer: Self-pay | Admitting: Vascular Surgery

## 2021-01-30 ENCOUNTER — Other Ambulatory Visit: Payer: Self-pay

## 2021-01-30 ENCOUNTER — Ambulatory Visit (INDEPENDENT_AMBULATORY_CARE_PROVIDER_SITE_OTHER): Payer: 59 | Admitting: Vascular Surgery

## 2021-01-30 ENCOUNTER — Encounter (INDEPENDENT_AMBULATORY_CARE_PROVIDER_SITE_OTHER): Payer: Self-pay | Admitting: Vascular Surgery

## 2021-01-30 VITALS — BP 159/120 | HR 85 | Resp 16 | Wt 241.8 lb

## 2021-01-30 DIAGNOSIS — I10 Essential (primary) hypertension: Secondary | ICD-10-CM | POA: Diagnosis not present

## 2021-01-30 DIAGNOSIS — E782 Mixed hyperlipidemia: Secondary | ICD-10-CM

## 2021-01-30 DIAGNOSIS — L97429 Non-pressure chronic ulcer of left heel and midfoot with unspecified severity: Secondary | ICD-10-CM

## 2021-01-30 DIAGNOSIS — E1100 Type 2 diabetes mellitus with hyperosmolarity without nonketotic hyperglycemic-hyperosmolar coma (NKHHC): Secondary | ICD-10-CM | POA: Diagnosis not present

## 2021-01-30 DIAGNOSIS — I7025 Atherosclerosis of native arteries of other extremities with ulceration: Secondary | ICD-10-CM

## 2021-01-30 NOTE — Progress Notes (Signed)
Patient ID: Nancy [name], female   DOB: Mar 04, 1965, 56 y.o.   MRN: [id number]  Chief Complaint  Patient presents with   New Patient (Initial Visit)    Ref ABI consult    HPI Nancy [name] is a 56 y.o. female.  I am asked to see the patient by Dr. Ether Griffins for evaluation of arterial perfusion in respect to a nonhealing ulceration on the left heel.  This has been present now for 2 to 3 months and has been slowly healing at best.  She has no pain as she is profoundly neuropathic.  Podiatry has been treating the wound and is done some debridement.  She is a longstanding type I diabetic and has had issues with wounds and healing before.  She does not describe typical claudication symptoms.  She does not describe rest pain.  ABIs were done today which were 1.1 bilaterally although the waveforms on the right were multiphasic and the waveforms on the left were monophasic so this may be falsely elevated due to medial calcification and it would suggest that the left leg perfusion is decent but not optimal.     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   Arthritis    r knee   Asthma     tx er 08/2010   Atherosclerosis of coronary artery 05/10/2014   [facility] (congestive cardiac failure) (HCC) 06/06/2008   30% EF    Diabetes mellitus    Eczema    Unspecified essential hypertension    FOLLOWED BY  [facility].PREVIOUSLY SEEN BY [facility] 2008    Past Surgical History:  Procedure Laterality Date   AMPUTATION TOE Left 03/16/2015   Procedure: left fifthe toe amputation with incision and drainage;  Surgeon: Gwyneth Revels, DPM;  Location: [facility] ORS;  Service: Podiatry;  Laterality: Left;   CARDIAC CATHETERIZATION     2011 followed by dr bensimohn   CHOLECYSTECTOMY     kidney stone removal     KNEE ARTHROSCOPY W/ MENISCAL REPAIR     NOSE SURGERY  1991   due to fracture   TOE AMPUTATION      second toe   TONSILLECTOMY     TOTAL KNEE ARTHROPLASTY  01/11/2011   Procedure: TOTAL KNEE ARTHROPLASTY;  Surgeon: Harvie Junior;  Location: [facility] OR;  Service: Orthopedics;  Laterality: Right;  COMPUTER ASSISTED TOTAL KNEE REPLACEMENT     Family History  Problem Relation Age of Onset   Transient ischemic attack Mother    Asthma Mother    Hypertension Mother    Lung cancer Father    Hypertension Father    Asthma Sister    Anxiety disorder Sister    Stomach cancer Maternal Grandmother    Colon cancer Maternal Grandmother    Leukemia Maternal Grandmother    Melanoma Maternal Grandfather    Asthma Sister    Hypertension Brother    Cancer Other    Crohn's disease Other    GER disease Other    Breast cancer Neg Hx       Social History   Tobacco Use   Smoking status: Former    Packs/day: 0.00    Years: 30.00    Pack years: 0.00    Types: Cigarettes    Quit date: 03/13/2015    Years since quitting: 5.8   Smokeless tobacco: Never  Vaping Use   Vaping Use: Never  used  Substance Use Topics   Alcohol use: No   Drug use: No     Allergies  Allergen Reactions   Shellfish Allergy Anaphylaxis   Codeine Other (See Comments)    Migraine   Morphine And Related Other (See Comments)    Reaction:  Severe migraines    Percocet [Oxycodone-Acetaminophen] Nausea And Vomiting   Rosuvastatin     Other reaction(s): Muscle Pain   Penicillins Rash and Other (See Comments)    Has patient had a PCN reaction causing immediate rash, facial/tongue/throat swelling, SOB or lightheadedness with hypotension: No Has patient had a PCN reaction causing severe rash involving mucus membranes or skin necrosis: No Has patient had a PCN reaction that required hospitalization No Has patient had a PCN reaction occurring within the last 10 years: No If all of the above answers are "NO", then may proceed with Cephalosporin use.   Sulfa Antibiotics Rash   Theophyllines Itching and Rash    Current Outpatient  Medications  Medication Sig Dispense Refill   albuterol (PROVENTIL HFA;VENTOLIN HFA) 108 (90 Base) MCG/ACT inhaler Inhale 2 puffs into the lungs every 4 (four) hours as needed for wheezing or shortness of breath. Reported on 03/29/2015 1 Inhaler 3   aspirin EC 81 MG tablet Take by mouth.     aspirin-acetaminophen-caffeine (EXCEDRIN MIGRAINE) 250-250-65 MG tablet Take 2 tablets by mouth every 6 (six) hours as needed for migraine.     carvedilol (COREG) 3.125 MG tablet Take 3.125 mg by mouth 2 (two) times daily with a meal.       desvenlafaxine (PRISTIQ) 50 MG 24 hr tablet Take 50 mg by mouth daily.     doxycycline (VIBRA-TABS) 100 MG tablet Take 100 mg by mouth 2 (two) times daily.     fenofibrate 54 MG tablet Take 54 mg by mouth daily.     ibuprofen (ADVIL,MOTRIN) 800 MG tablet Take 800 mg by mouth every 8 (eight) hours as needed (for migraines).      insulin NPH-regular Human (NOVOLIN 70/30) (70-30) 100 UNIT/ML injection Inject 40 Units into the skin 2 (two) times daily.      lisinopril (PRINIVIL,ZESTRIL) 10 MG tablet Take 1 tablet (10 mg total) by mouth 2 (two) times daily. (Patient taking differently: Take 5 mg by mouth 2 (two) times daily.) 60 tablet 1   metFORMIN (GLUCOPHAGE) 500 MG tablet Take 3 tablets (1,500 mg total) by mouth 2 (two) times daily with a meal. Pt takes two tablets with breakfast and three tablets with dinner. 150 tablet 1   Miconazole Nitrate 2 % POWD 200 mg by Does not apply route 2 (two) times daily as needed. 100 g 1   montelukast (SINGULAIR) 10 MG tablet Take 10 mg by mouth daily.     MOUNJARO 5 MG/0.5ML Pen Inject into the skin.     nystatin (MYCOSTATIN/NYSTOP) powder Apply topically 4 (four) times daily. (Patient taking differently: Apply topically as needed.) 15 g 0   ondansetron (ZOFRAN) 4 MG tablet      pantoprazole (PROTONIX) 40 MG tablet pantoprazole 40 mg tablet,delayed release     pravastatin (PRAVACHOL) 40 MG tablet Take 1 tablet (40 mg total) by mouth at  bedtime. 30 tablet 5   SANTYL ointment Apply topically.     traMADol (ULTRAM) 50 MG tablet Take 25-50 mg by mouth every 4 (four) hours as needed (for migraines).     Insulin Glargine (LANTUS) 100 UNIT/ML Solostar Pen Inject into the skin. (Patient not taking: Reported on 01/30/2021)  insulin NPH-regular Human (NOVOLIN 70/30) (70-30) 100 UNIT/ML injection 40 units in the am.  35 units in the pm (Patient not taking: Reported on 01/30/2021)     No current facility-administered medications for this visit.      REVIEW OF SYSTEMS (Negative unless checked)  Constitutional: [] Weight loss  [] Fever  [] Chills Cardiac: [] Chest pain   [] Chest pressure   [] Palpitations   [] Shortness of breath when laying flat   [] Shortness of breath at rest   [] Shortness of breath with exertion. Vascular:  [] Pain in legs with walking   [] Pain in legs at rest   [] Pain in legs when laying flat   [] Claudication   [] Pain in feet when walking  [] Pain in feet at rest  [] Pain in feet when laying flat   [] History of DVT   [] Phlebitis   [] Swelling in legs   [] Varicose veins   [x] Non-healing ulcers Pulmonary:   [] Uses home oxygen   [] Productive cough   [] Hemoptysis   [] Wheeze  [] COPD   [] Asthma Neurologic:  [] Dizziness  [] Blackouts   [] Seizures   [] History of stroke   [] History of TIA  [] Aphasia   [] Temporary blindness   [] Dysphagia   [] Weakness or numbness in arms   [x] Weakness or numbness in legs Musculoskeletal:  [x] Arthritis   [] Joint swelling   [] Joint pain   [] Low back pain Hematologic:  [] Easy bruising  [] Easy bleeding   [] Hypercoagulable state   [] Anemic  [] Hepatitis Gastrointestinal:  [] Blood in stool   [] Vomiting blood  [] Gastroesophageal reflux/heartburn   [] Abdominal pain Genitourinary:  [] Chronic kidney disease   [] Difficult urination  [] Frequent urination  [] Burning with urination   [] Hematuria Skin:  [] Rashes   [x] Ulcers   [x] Wounds Psychological:  [] History of anxiety   []  History of major depression.    Physical  Exam BP (!) 159/120 (BP Location: Left Arm)    Pulse 85    Resp 16    Wt 241 lb 12.8 oz (109.7 kg)    BMI 35.71 kg/m  Gen:  WD/WN, NAD.  Appears older than stated age Head: [place]/AT, No temporalis wasting.  Ear/Nose/Throat: Hearing grossly intact, nares w/o erythema or drainage, oropharynx w/o Erythema/Exudate Eyes: Conjunctiva clear, sclera non-icteric  Neck: trachea midline.  No JVD.  Pulmonary:  Good air movement, respirations not labored, no use of accessory muscles  Cardiac: RRR, no JVD Vascular:  Vessel Right Left  Radial Palpable Palpable                          DP 1+ 1+  PT 1+ NP   Gastrointestinal:. No masses, surgical incisions, or scars. Musculoskeletal: M/S 5/5 throughout.  Extremities without ischemic changes.  1-2+ LLE edema, left foot wound is currently dressed Neurologic: Sensation grossly intact in extremities.  Symmetrical.  Speech is fluent. Motor exam as listed above. Psychiatric: Judgment intact, Mood & affect appropriate for pt's clinical situation. Dermatologic: Left heel wound is currently dressed    Radiology No results found.  Labs No results found for this or any previous visit (from the past 2160 hour(s)).  Assessment/Plan:  Essential hypertension blood pressure control important in reducing the progression of atherosclerotic disease. On appropriate oral medications.   Diabetes mellitus, type 2 (HCC) blood glucose control important in reducing the progression of atherosclerotic disease. Also, involved in wound healing. On appropriate medications.   Combined fat and carbohydrate induced hyperlipemia lipid control important in reducing the progression of atherosclerotic disease. Continue statin therapy   Atherosclerosis  of native arteries of the extremities with ulceration (HCC) ABIs were done today which were 1.1 bilaterally although the waveforms on the right were multiphasic and the waveforms on the left were monophasic so this may be  falsely elevated due to medial calcification and it would suggest that the left leg perfusion is decent but not optimal. Although her flow would appear to be reasonable and possibly adequate for wound healing, this is a heel ulceration and it is possible that even with adequate ABIs there is tibial disease targeting this area of the foot that may be limiting wound healing.  An angiogram would be a reasonable option although a short interval follow-up is okay if the podiatry folks feel that the wound is healing at least slightly.  She is scheduled to see them later this week, and we will make a decision on whether or not she would like to proceed with angiogram following this visit.  For now, we will going to make a 2 to 69-month follow-up with ABIs.      Leotis Pain 01/31/2021, 1:55 PM   This note was created with Dragon medical transcription system.  Any errors from dictation are unintentional.

## 2021-01-31 NOTE — Assessment & Plan Note (Addendum)
ABIs were done today which were 1.1 bilaterally although the waveforms on the right were multiphasic and the waveforms on the left were monophasic so this may be falsely elevated due to medial calcification and it would suggest that the left leg perfusion is decent but not optimal. Although her flow would appear to be reasonable and possibly adequate for wound healing, this is a heel ulceration and it is possible that even with adequate ABIs there is tibial disease targeting this area of the foot that may be limiting wound healing.  An angiogram would be a reasonable option although a short interval follow-up is okay if the podiatry folks feel that the wound is healing at least slightly.  She is scheduled to see them later this week, and we will make a decision on whether or not she would like to proceed with angiogram following this visit.  For now, we will going to make a 2 to 8-month follow-up with ABIs.

## 2021-01-31 NOTE — Assessment & Plan Note (Signed)
blood pressure control important in reducing the progression of atherosclerotic disease. On appropriate oral medications.  

## 2021-01-31 NOTE — Assessment & Plan Note (Signed)
blood glucose control important in reducing the progression of atherosclerotic disease. Also, involved in wound healing. On appropriate medications.  

## 2021-01-31 NOTE — Assessment & Plan Note (Signed)
lipid control important in reducing the progression of atherosclerotic disease. Continue statin therapy  

## 2021-02-01 ENCOUNTER — Other Ambulatory Visit: Payer: Self-pay | Admitting: Podiatry

## 2021-02-05 ENCOUNTER — Encounter: Payer: Self-pay | Admitting: Podiatry

## 2021-02-05 ENCOUNTER — Other Ambulatory Visit: Payer: Self-pay

## 2021-02-05 ENCOUNTER — Other Ambulatory Visit
Admission: RE | Admit: 2021-02-05 | Discharge: 2021-02-05 | Disposition: A | Discharge status: Home / Self Care | Payer: 59 | Source: Ambulatory Visit | Attending: Podiatry | Admitting: Podiatry

## 2021-02-05 HISTORY — DX: Other complications of anesthesia, initial encounter: T88.59XA

## 2021-02-05 HISTORY — DX: Other specified postprocedural states: Z98.890

## 2021-02-05 HISTORY — DX: Personal history of urinary calculi: Z87.442

## 2021-02-05 HISTORY — DX: Nausea with vomiting, unspecified: R11.2

## 2021-02-05 HISTORY — DX: Family history of other specified conditions: Z84.89

## 2021-02-05 HISTORY — DX: Pneumonia, unspecified organism: J18.9

## 2021-02-05 NOTE — Patient Instructions (Addendum)
Your procedure is scheduled on:02/08/21 - Thursday Report to the Registration Desk on the 1st floor of [facility]. To find out your arrival time, please call [phone] between 1PM - 3PM on: 02/07/21 - Wednesday Report to [facility] for Labs/EKG on 02/06/21 at 3:30 pm.  REMEMBER: Instructions that are not followed completely may result in serious medical risk, up to and including death; or upon the discretion of your surgeon and anesthesiologist your surgery may need to be rescheduled.  Do not eat food after midnight the night before surgery.  No gum chewing, lozengers or hard candies.  You may however, drink CLEAR liquids up to 2 hours before you are scheduled to arrive for your surgery. Do not drink anything within 2 hours of your scheduled arrival time.  Clear liquids include: -Type 1 and Type 2 diabetics should only drink water.  TAKE THESE MEDICATIONS THE MORNING OF SURGERY WITH A SIP OF WATER:  - carvedilol (COREG) 3.125 MG tablet  - pantoprazole (PROTONIX) 40 MG tablet, (take one the night before and one on the morning of surgery - helps to prevent nausea after surgery.)  Do Not take metFORMIN (GLUCOPHAGE) 500 MG tablet on 01/31, 02/01 and do not take the day of surgery.  Use inhalers on the day of surgery and bring to the hospital.  Follow recommendations from Cardiologist, Pulmonologist or PCP regarding stopping Aspirin, Coumadin, Plavix, Eliquis, Pradaxa, or Pletal.  One week prior to surgery: Stop Anti-inflammatories (NSAIDS) such as Advil, Aleve, Ibuprofen, Motrin, Naproxen, Naprosyn and Aspirin based products such as Excedrin, Goodys Powder, BC Powder.  Stop ANY OVER THE COUNTER supplements until after surgery.  You may however, continue to take Tylenol if needed for pain up until the day of surgery.  No Alcohol for 24 hours before or after surgery.  No Smoking including e-cigarettes for 24 hours prior to surgery.  No chewable tobacco products for  at least 6 hours prior to surgery.  No nicotine patches on the day of surgery.  Do not use any "recreational" drugs for at least a week prior to your surgery.  Please be advised that the combination of cocaine and anesthesia may have negative outcomes, up to and including death. If you test positive for cocaine, your surgery will be cancelled.  On the morning of surgery brush your teeth with toothpaste and water, you may rinse your mouth with mouthwash if you wish. Do not swallow any toothpaste or mouthwash.  Use CHG Soap or wipes as directed on instruction sheet.  Do not wear jewelry, make-up, hairpins, clips or nail polish.  Do not wear lotions, powders, or perfumes.   Do not shave body from the neck down 48 hours prior to surgery just in case you cut yourself which could leave a site for infection.  Also, freshly shaved skin may become irritated if using the CHG soap.  Contact lenses, hearing aids and dentures may not be worn into surgery.  Do not bring valuables to the hospital. [facility] is not responsible for any missing/lost belongings or valuables.   Notify your doctor if there is any change in your medical condition (cold, fever, infection).  Wear comfortable clothing (specific to your surgery type) to the hospital.  After surgery, you can help prevent lung complications by doing breathing exercises.  Take deep breaths and cough every 1-2 hours. Your doctor may order a device called an Incentive Spirometer to help you take deep breaths. When coughing or sneezing, hold a pillow  firmly against your incision with both hands. This is called splinting. Doing this helps protect your incision. It also decreases belly discomfort.  If you are being admitted to the hospital overnight, leave your suitcase in the car. After surgery it may be brought to your room.  If you are being discharged the day of surgery, you will not be allowed to drive home. You will need a responsible  adult (18 years or older) to drive you home and stay with you that night.   If you are taking public transportation, you will need to have a responsible adult (18 years or older) with you. Please confirm with your physician that it is acceptable to use public transportation.   Please call the Pre-admissions Testing Dept. at [phone] if you have any questions about these instructions.  Surgery Visitation Policy:  Patients undergoing a surgery or procedure may have one family member or support person with them as long as that person is not COVID-19 positive or experiencing its symptoms.  That person may remain in the waiting area during the procedure and may rotate out with other people.  Inpatient Visitation:    Visiting hours are 7 a.m. to 8 p.m. Up to two visitors ages 16+ are allowed at one time in a patient room. The visitors may rotate out with other people during the day. Visitors must check out when they leave, or other visitors will not be allowed. One designated support person may remain overnight. The visitor must pass COVID-19 screenings, use hand sanitizer when entering and exiting the patients room and wear a mask at all times, including in the patients room. Patients must also wear a mask when staff or their visitor are in the room. Masking is required regardless of vaccination status.

## 2021-02-06 ENCOUNTER — Encounter: Payer: Self-pay | Admitting: Urgent Care

## 2021-02-06 ENCOUNTER — Encounter: Payer: Self-pay | Admitting: Podiatry

## 2021-02-06 ENCOUNTER — Other Ambulatory Visit
Admission: RE | Admit: 2021-02-06 | Discharge: 2021-02-06 | Disposition: A | Discharge status: Home / Self Care | Payer: 59 | Source: Ambulatory Visit | Attending: Podiatry | Admitting: Podiatry

## 2021-02-06 DIAGNOSIS — Z01812 Encounter for preprocedural laboratory examination: Secondary | ICD-10-CM

## 2021-02-06 DIAGNOSIS — Z01818 Encounter for other preprocedural examination: Secondary | ICD-10-CM | POA: Insufficient documentation

## 2021-02-06 DIAGNOSIS — A419 Sepsis, unspecified organism: Secondary | ICD-10-CM | POA: Diagnosis not present

## 2021-02-06 DIAGNOSIS — E11621 Type 2 diabetes mellitus with foot ulcer: Secondary | ICD-10-CM | POA: Diagnosis not present

## 2021-02-06 LAB — BASIC METABOLIC PANEL
Anion gap: 11 (ref 5–15)
BUN: 15 mg/dL (ref 6–20)
CO2: 27 mmol/L (ref 22–32)
Calcium: 8.9 mg/dL (ref 8.9–10.3)
Chloride: 92 mmol/L — ABNORMAL LOW (ref 98–111)
Creatinine, Ser: 0.86 mg/dL (ref 0.44–1.00)
GFR, Estimated: >60 mL/min (ref >60)
Glucose, Bld: 367 mg/dL — ABNORMAL HIGH (ref 70–99)
Potassium: 3.9 mmol/L (ref 3.5–5.1)
Sodium: 130 mmol/L — ABNORMAL LOW (ref 135–145)

## 2021-02-06 LAB — CBC
HCT: 39.1 % (ref 36.0–46.0)
Hemoglobin: 12.7 g/dL (ref 12.0–15.0)
MCH: 29.5 pg (ref 26.0–34.0)
MCHC: 32.5 g/dL (ref 30.0–36.0)
MCV: 90.9 fL (ref 80.0–100.0)
Platelets: 439 10*3/uL — ABNORMAL HIGH (ref 150–400)
RBC: 4.3 MIL/uL (ref 3.87–5.11)
RDW: 12.7 % (ref 11.5–15.5)
WBC: 11.6 10*3/uL — ABNORMAL HIGH (ref 4.0–10.5)
nRBC: 0 % (ref 0.0–0.2)

## 2021-02-06 NOTE — Progress Notes (Signed)
Perioperative Services  Pre-Admission/Anesthesia Testing Clinical Review  Date: 02/07/21  Patient Demographics:  Name: Nancy [name] DOB:   03/23/1965 MRN:   [id number]  Planned Surgical Procedure(s):    Case: [id number] Date/Time: 02/08/21 0715   Procedures:      IRRIGATION AND DEBRIDEMENT FOOT - LFT HEEL ULCER (Left)     APPLICATION OF WOUND VAC - KCI (Left)   Anesthesia type: Choice   Pre-op diagnosis:      L97.423 - Ulcer of left heel and midfoot with necrosis of muscle      L03.116 - Cellulitis of left foot   Location: [facility] OR ROOM 05 / [place] [facility]   Surgeons: Samara Deist, DPM   NOTE: Available PAT nursing documentation and vital signs have been reviewed. Clinical nursing staff has updated patient's PMH/PSHx, current medication list, and drug allergies/intolerances to ensure comprehensive history available to assist in medical decision making as it pertains to the aforementioned surgical procedure and anticipated anesthetic course. Extensive review of available clinical information performed. B and E PMH and PSHx updated with any diagnoses/procedures that  may have been inadvertently omitted during her intake with the pre-admission testing department's nursing staff.  Clinical Discussion:  Nancy [name] is a 56 y.o. female who is submitted for pre-surgical anesthesia review and clearance prior to her undergoing the above procedure. Patient is a Former Smoker (quit 03/2015). Pertinent PMH includes: CAD, Takotsubo cardiomyopathy, CHF, HTN, HLD, T2DM, GERD (on daily PPI), asthma, OA, motion sickness, anxiety, panic attacks.  Patient is followed by cardiology Saralyn Pilar, MD). She was last seen in the cardiology clinic on 08/21/2020; notes reviewed.  At the time of her clinic visit, patient doing well overall from a cardiovascular perspective.  She denied any chest pain, shortness breath, PND, orthopnea, palpitations, significant peripheral edema,  vertiginous symptoms, or presyncope/syncope.  Past medical history significant for cardiovascular diagnoses.  Remote TTE performed on 06/06/2008 revealed a reduced left ventricular systolic function with an EF of 30%.  Diagnostic left heart catheterization performed on 06/04/2009 at [name] revealed nonobstructive CAD.  Repeat TTE performed on 06/03/2011 revealed significant improvement in her left ventricular systolic function and overall EF to >55%.  There was trivial mitral valve regurgitation.  There was no evidence of a significant transvalvular gradient to suggest valvular stenosis.  Last TTE was performed on 04/27/2018 revealing normal left ventricular systolic function with mild LVH; LVEF 55%.  There was trivial PR and mild MR/TR.  Diastolic Doppler parameters consistent with impaired relaxation (G1DD).  There was no evidence of valvular stenosis.  Myocardial perfusion imaging study performed on 09/17/2018 revealed a normal left ventricular systolic function with an EF of 63%.  There was normal wall motion.  SPECT images demonstrate a small reversible perfusion abnormality of mild intensity present in the anterior region.  CTA with FFR performed on 10/08/2018 revealed extensive mixed plaque proximal to the mid LAD; 51-69%.  Coronary calcium score 217 Agatston units, placing the patient in the 99th percentile for age and sex matched control.  FFR 0.71 in the distal PDA, 0.86 in the mid LAD, and 0.87 in the distal LCx.  Proximal/mid LAD is disease not hemodynamically significant.  Mid PDA stenosis hemodynamically significant.  Blood pressure well controlled at 120/76 on currently prescribed beta-blocker and ACEi therapies.  Patient is on a statin + fenofibrate therapy for her HLD diagnosis and ASCVD prevention.  T2DM poorly controlled; last HgbA1c was 12.9% when checked on 06/29/2019.  Patient reported to be active  with no formal exercise regimen; has gained 12 pounds since her  last office visit.  Functional capacity somewhat limited by arthritides and foot ulcer, however patient still felt to be able to achieve at least 4 METS of activity.  No changes were made to her medication regimen.  Patient to follow-up with outpatient cardiology and 6 months or sooner if needed.  Nancy [name] is scheduled for an IRRIGATION AND DEBRIDEMENT FOOT - LEFT HEEL ULCER; APPLICATION OF WOUND VAC on 02/08/2021 with Dr. Samara Deist, DPM. Given patient's past medical history significant for cardiovascular diagnoses, presurgical cardiac clearance was sought by the PAT team. Per cardiology, "this patient is optimized for surgery and may proceed with the planned procedural course with a LOW risk of significant perioperative cardiovascular complications".  This patient is on daily antiplatelet therapy. She has been instructed on recommendations for holding her daily low-dose ASA for 3 days prior to her procedure with plans to restart as soon as postoperative bleeding risk felt to be minimized by her attending surgeon. The patient has been instructed that her last dose of her anticoagulant will be on 02/04/2021.  Patient reports previous perioperative complications with anesthesia in the past. Patient has a PMH (+) for PONV.  Order placed by PAT for patient to receive a prophylactic preoperative dose of aprepitant.  Additionally, patient has a (+) family history of  PONV in her mother and grandmother.  In review of the available records, it is noted that patient underwent a general anesthetic course here (ASA IV) in 03/2015 without documented complications.   Vitals with BMI 01/30/2021 06/15/2019 06/15/2019  Height - - -  Weight 241 lbs 13 oz - -  BMI - - -  Systolic Q000111Q - 123XX123  Diastolic 123456 - 0000000  Pulse 85 100 77    Providers/Specialists:   NOTE: Primary physician provider listed below. Patient may have been seen by APP or partner within same practice.   PROVIDER ROLE / SPECIALTY LAST Montel Culver, DPM Podiatry (Surgeon) 01/31/2021  Mechele Claude, FNP Primary Care Provider ???  Isaias Cowman, MD Cardiology 08/21/2020  Leotis Pain, MD Vascular Surgery 01/30/2021   Allergies:  Shellfish allergy, Codeine, Morphine and related, Percocet [oxycodone-acetaminophen], Rosuvastatin, Penicillins, Sulfa antibiotics, and Theophyllines  Current Home Medications:   No current facility-administered medications for this encounter.    acidophilus (RISAQUAD) CAPS capsule   albuterol (PROVENTIL HFA;VENTOLIN HFA) 108 (90 Base) MCG/ACT inhaler   aspirin EC 81 MG tablet   aspirin-acetaminophen-caffeine (EXCEDRIN MIGRAINE) 250-250-65 MG tablet   carvedilol (COREG) 3.125 MG tablet   desvenlafaxine (PRISTIQ) 50 MG 24 hr tablet   doxycycline (VIBRA-TABS) 100 MG tablet   fenofibrate 54 MG tablet   ibuprofen (ADVIL,MOTRIN) 800 MG tablet   metFORMIN (GLUCOPHAGE) 500 MG tablet   montelukast (SINGULAIR) 10 MG tablet   MOUNJARO 5 MG/0.5ML Pen   nystatin (MYCOSTATIN/NYSTOP) powder   ondansetron (ZOFRAN) 4 MG tablet   pantoprazole (PROTONIX) 40 MG tablet   pravastatin (PRAVACHOL) 40 MG tablet   SANTYL ointment   traMADol (ULTRAM) 50 MG tablet   lisinopril (PRINIVIL,ZESTRIL) 10 MG tablet   lisinopril (ZESTRIL) 5 MG tablet   Miconazole Nitrate 2 % POWD   History:   Past Medical History:  Diagnosis Date   Anxiety    Arthritis    r knee   Asthma    CAD (coronary artery disease)    a.) LHC 06/04/2009 at [facility]; non-obstructive CAD. b.) CTA with FFR 10/08/2018: extensive mixed plaque  proximal to mid LAD (51-69%); Coronary Ca score 217; FFR 0.71 dPDA, 0.86 mLAD, 0.87 dLCx.   [facility] (congestive cardiac failure) ([place]) 06/06/2008   a.) 30% EF. b.) TTE 06/03/2011: EF >55%; triv MR. c.) TTE 04/27/2018: EF 55%; mild LVH; triv PR, mild MR/TR; G1DD.   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 05/10/2014   Unspecified essential hypertension    Past Surgical History:  Procedure Laterality Date   AMPUTATION TOE Left 03/16/2015   Procedure: left fifth toe amputation with incision and drainage;  Surgeon: Samara Deist, DPM;  Location: [facility] ORS;  Service: Podiatry;  Laterality: Left;   CARDIAC CATHETERIZATION Left 06/04/2009   Procedure: CARDIAC CATHETERIZATION; Location: [place]     kidney stone removal     KNEE ARTHROSCOPY W/ MENISCAL REPAIR     NOSE SURGERY  01/07/1989   due to fracture   TOE AMPUTATION     second toe   TONSILLECTOMY     TOTAL KNEE ARTHROPLASTY  01/11/2011   Procedure: TOTAL KNEE ARTHROPLASTY;  Surgeon: Alta Corning;  Location: [place];  Service: Orthopedics;  Laterality: Right;  COMPUTER ASSISTED TOTAL KNEE REPLACEMENT   Family History  Problem Relation Age of Onset   Transient ischemic attack Mother    Asthma Mother    Hypertension Mother    Lung cancer Father    Hypertension Father    Asthma Sister    Anxiety disorder Sister    Stomach cancer Maternal Grandmother    Colon cancer Maternal Grandmother    Leukemia Maternal Grandmother    Melanoma Maternal Grandfather    Asthma Sister    Hypertension Brother    Cancer Other    Crohn's disease Other    GER disease Other    Breast cancer Neg Hx    Social History   Tobacco Use   Smoking status: Former    Packs/day: 0.00    Years: 30.00    Pack years: 0.00    Types: Cigarettes    Quit date: 03/13/2015    Years since quitting: 5.9   Smokeless tobacco: Never  Vaping Use   Vaping Use: Never used  Substance Use Topics    Alcohol use: No   Drug use: No    Pertinent Clinical Results:  LABS: Labs reviewed: Acceptable for surgery.  Hospital Outpatient Visit on 02/06/2021  Component Date Value Ref Range Status   WBC 02/06/2021 11.6 (H)  4.0 - 10.5 K/uL Final   RBC 02/06/2021 4.30  3.87 - 5.11 MIL/uL Final   Hemoglobin 02/06/2021 12.7  12.0 - 15.0 g/dL Final   HCT 02/06/2021 39.1  36.0 - 46.0 % Final   MCV 02/06/2021 90.9  80.0 - 100.0 fL Final   MCH 02/06/2021 29.5  26.0 - 34.0 pg Final   MCHC 02/06/2021 32.5  30.0 - 36.0 g/dL Final   RDW 02/06/2021 12.7  11.5 - 15.5 % Final   Platelets 02/06/2021 439 (H)  150 - 400 K/uL Final  nRBC 02/06/2021 0.0  0.0 - 0.2 % Final   Performed at [facility], [place], [place] [address]   Sodium 02/06/2021 130 (L)  135 - 145 mmol/L Final   Potassium 02/06/2021 3.9  3.5 - 5.1 mmol/L Final   Chloride 02/06/2021 92 (L)  98 - 111 mmol/L Final   CO2 02/06/2021 27  22 - 32 mmol/L Final   Glucose, Bld 02/06/2021 367 (H)  70 - 99 mg/dL Final   Glucose reference range applies only to samples taken after fasting for at least 8 hours.   BUN 02/06/2021 15  6 - 20 mg/dL Final   Creatinine, Ser 02/06/2021 0.86  0.44 - 1.00 mg/dL Final   Calcium 02/06/2021 8.9  8.9 - 10.3 mg/dL Final   GFR, Estimated 02/06/2021 >60  >60 mL/min Final   Comment: (NOTE) Calculated using the CKD-EPI Creatinine Equation (2021)    Anion gap 02/06/2021 11  5 - 15 Final   Performed at [facility], [place], [place] [address]   Hgb A1c MFr Bld 02/06/2021 12.4 (H)  4.8 - 5.6 % Final   Comment: (NOTE)         Prediabetes: 5.7 - 6.4         Diabetes: >6.4         Glycemic control for adults with diabetes: <7.0    Mean Plasma Glucose 02/06/2021 309  mg/dL Final   Comment: (NOTE) Performed At: [facility] [place], [place] [id number] Rush Farmer MD [id number]     ECG: Date: 02/07/2021 Time ECG obtained: 1524 PM Rate:  95 bpm Rhythm: normal sinus Axis (leads I and aVF): Normal Intervals: PR 130 ms. QRS 84 ms. QTc 444 ms. ST segment and T wave changes: No evidence of acute ST segment elevation or depression Comparison: Similar to previous tracing obtained on 06/15/2019   IMAGING / PROCEDURES: DIAGNOSTIC RADIOGRAPHS OF CALCANEUS LEFT MINIMUM 2 VIEWS performed on 01/31/2021 Lateral and calcaneal axial of the LEFT heel is negative for acute fracture Soft tissue defect on the posterior aspect of the heel is noted with shadowing Calcified arteries diffusely to the anterior and posterior aspect of the ankle Severe pes planus foot type is noted  VASCULAR US ABI WITH/WO TBI performed on 01/30/2021 Right:  Resting right ankle-brachial index is within normal range.  No evidence of significant right lower extremity arterial disease.  The right toe-brachial index is normal.  Left:  The left toe-brachial index is abnormal.  Although ankle brachial indices are within normal limits (0.95-1.29), arterial Doppler waveforms at the ankle suggest some component of arterial occlusive disease.   CT CORONARY MORPH W/CTA COR W/SCORE W/CA W/CM &/OR WO/CM performed on 10/09/2018 Coronary artery calcium score 217 Agatston units. This places the patient in the 99th percentile for age and gender. Possible moderate proximal to mid LAD stenosis --> send for FFR.  FFR: Distal PDA 0.71 Mid LAD 0.86 Distal LCx 0.87  IMPRESSION: The proximal/mid LAD disease does not appear to be hemodynamically significant. Hemodynamically significant mid PDA stenosis. Probably reasonable to manage medically unless the patient is significantly symptomatic.  MYOCARDIAL PERFUSION IMAGING STUDY (LEXISCAN) performed on 09/17/2018 LVEF 63% Normal myocardial thickening and wall motion No artifact Left ventricular cavity size normal Small reversible perfusion abnormality of mild intensity present in the anterior region on stress images Overall  quality of the study is good  TRANSTHORACIC ECHOCARDIOGRAM performed on 04/27/2018 LVEF >55% Normal left ventricular systolic function with  mild LVH Normal right ventricular systolic function Trivial PR Mild MR and TR No AR No valvular stenosis No evidence of pericardial effusion  Impression and Plan:  Nancy [name] has been referred for pre-anesthesia review and clearance prior to her undergoing the planned anesthetic and procedural courses. Available labs, pertinent testing, and imaging results were personally reviewed by me. Hgb A1c was repeated and noted to be persistently elevated (previously 12.9% in 06/2019). Unable to postpone procedure in the setting of already infected foot; benefits of procedure outweigh further associated risks. This patient has been appropriately cleared by cardiology with an overall LOW risk of significant perioperative cardiovascular complications.  Based on clinical review performed today (02/07/21), barring any significant acute changes in the patient's overall condition, it is anticipated that she will be able to proceed with the planned surgical intervention. Any acute changes in clinical condition may necessitate her procedure being postponed and/or cancelled. Patient will meet with anesthesia team (MD and/or CRNA) on the day of her procedure for preoperative evaluation/assessment. Questions regarding anesthetic course will be fielded at that time.   Pre-surgical instructions were reviewed with the patient during her PAT appointment and questions were fielded by PAT clinical staff. Patient was advised that if any questions or concerns arise prior to her procedure then she should return a call to PAT and/or her surgeon's office to discuss.  Honor Loh, MSN, APRN, FNP-C, CEN [employer]  Peri-operative Services Nurse Practitioner Phone: [phone] Fax: [phone] 02/07/21 11:56 AM  NOTE: This note has been prepared using  Dragon dictation software. Despite my best ability to proofread, there is always the potential that unintentional transcriptional errors may still occur from this process.

## 2021-02-07 ENCOUNTER — Other Ambulatory Visit: Payer: Self-pay

## 2021-02-07 ENCOUNTER — Encounter: Payer: Self-pay | Admitting: *Deleted

## 2021-02-07 ENCOUNTER — Inpatient Hospital Stay
Admission: EM | Admit: 2021-02-07 | Discharge: 2021-02-18 | DRG: 854 | Disposition: A | Discharge status: Home Health | Payer: 59 | Attending: Internal Medicine | Admitting: Internal Medicine

## 2021-02-07 ENCOUNTER — Emergency Department: Payer: 59

## 2021-02-07 DIAGNOSIS — Z825 Family history of asthma and other chronic lower respiratory diseases: Secondary | ICD-10-CM

## 2021-02-07 DIAGNOSIS — E11610 Type 2 diabetes mellitus with diabetic neuropathic arthropathy: Secondary | ICD-10-CM | POA: Diagnosis present

## 2021-02-07 DIAGNOSIS — E871 Hypo-osmolality and hyponatremia: Secondary | ICD-10-CM | POA: Diagnosis present

## 2021-02-07 DIAGNOSIS — I110 Hypertensive heart disease with heart failure: Secondary | ICD-10-CM | POA: Diagnosis present

## 2021-02-07 DIAGNOSIS — J45909 Unspecified asthma, uncomplicated: Secondary | ICD-10-CM | POA: Diagnosis present

## 2021-02-07 DIAGNOSIS — Z20822 Contact with and (suspected) exposure to covid-19: Secondary | ICD-10-CM | POA: Diagnosis present

## 2021-02-07 DIAGNOSIS — E11621 Type 2 diabetes mellitus with foot ulcer: Secondary | ICD-10-CM | POA: Diagnosis present

## 2021-02-07 DIAGNOSIS — R197 Diarrhea, unspecified: Secondary | ICD-10-CM | POA: Diagnosis not present

## 2021-02-07 DIAGNOSIS — Z888 Allergy status to other drugs, medicaments and biological substances status: Secondary | ICD-10-CM

## 2021-02-07 DIAGNOSIS — M84472A Pathological fracture, left ankle, initial encounter for fracture: Secondary | ICD-10-CM | POA: Diagnosis present

## 2021-02-07 DIAGNOSIS — E119 Type 2 diabetes mellitus without complications: Secondary | ICD-10-CM

## 2021-02-07 DIAGNOSIS — Z6835 Body mass index (BMI) 35.0-35.9, adult: Secondary | ICD-10-CM

## 2021-02-07 DIAGNOSIS — L03116 Cellulitis of left lower limb: Secondary | ICD-10-CM | POA: Diagnosis present

## 2021-02-07 DIAGNOSIS — K3184 Gastroparesis: Secondary | ICD-10-CM | POA: Diagnosis present

## 2021-02-07 DIAGNOSIS — Z96651 Presence of right artificial knee joint: Secondary | ICD-10-CM | POA: Diagnosis present

## 2021-02-07 DIAGNOSIS — L97429 Non-pressure chronic ulcer of left heel and midfoot with unspecified severity: Secondary | ICD-10-CM | POA: Diagnosis present

## 2021-02-07 DIAGNOSIS — L97509 Non-pressure chronic ulcer of other part of unspecified foot with unspecified severity: Secondary | ICD-10-CM | POA: Diagnosis present

## 2021-02-07 DIAGNOSIS — E1169 Type 2 diabetes mellitus with other specified complication: Secondary | ICD-10-CM | POA: Diagnosis present

## 2021-02-07 DIAGNOSIS — B952 Enterococcus as the cause of diseases classified elsewhere: Secondary | ICD-10-CM | POA: Diagnosis present

## 2021-02-07 DIAGNOSIS — Z720 Tobacco use: Secondary | ICD-10-CM | POA: Diagnosis present

## 2021-02-07 DIAGNOSIS — E1100 Type 2 diabetes mellitus with hyperosmolarity without nonketotic hyperglycemic-hyperosmolar coma (NKHHC): Secondary | ICD-10-CM

## 2021-02-07 DIAGNOSIS — A419 Sepsis, unspecified organism: Principal | ICD-10-CM | POA: Diagnosis present

## 2021-02-07 DIAGNOSIS — I10 Essential (primary) hypertension: Secondary | ICD-10-CM | POA: Diagnosis present

## 2021-02-07 DIAGNOSIS — Z79899 Other long term (current) drug therapy: Secondary | ICD-10-CM

## 2021-02-07 DIAGNOSIS — D638 Anemia in other chronic diseases classified elsewhere: Secondary | ICD-10-CM | POA: Diagnosis present

## 2021-02-07 DIAGNOSIS — E1165 Type 2 diabetes mellitus with hyperglycemia: Secondary | ICD-10-CM | POA: Diagnosis present

## 2021-02-07 DIAGNOSIS — L02612 Cutaneous abscess of left foot: Secondary | ICD-10-CM | POA: Diagnosis present

## 2021-02-07 DIAGNOSIS — Z885 Allergy status to narcotic agent status: Secondary | ICD-10-CM

## 2021-02-07 DIAGNOSIS — Z91013 Allergy to seafood: Secondary | ICD-10-CM

## 2021-02-07 DIAGNOSIS — K219 Gastro-esophageal reflux disease without esophagitis: Secondary | ICD-10-CM | POA: Diagnosis present

## 2021-02-07 DIAGNOSIS — E669 Obesity, unspecified: Secondary | ICD-10-CM | POA: Diagnosis present

## 2021-02-07 DIAGNOSIS — I2510 Atherosclerotic heart disease of native coronary artery without angina pectoris: Secondary | ICD-10-CM | POA: Diagnosis present

## 2021-02-07 DIAGNOSIS — E1140 Type 2 diabetes mellitus with diabetic neuropathy, unspecified: Secondary | ICD-10-CM | POA: Diagnosis present

## 2021-02-07 DIAGNOSIS — Z8249 Family history of ischemic heart disease and other diseases of the circulatory system: Secondary | ICD-10-CM

## 2021-02-07 DIAGNOSIS — L97426 Non-pressure chronic ulcer of left heel and midfoot with bone involvement without evidence of necrosis: Secondary | ICD-10-CM

## 2021-02-07 DIAGNOSIS — D75839 Thrombocytosis, unspecified: Secondary | ICD-10-CM | POA: Diagnosis present

## 2021-02-07 DIAGNOSIS — M009 Pyogenic arthritis, unspecified: Secondary | ICD-10-CM | POA: Diagnosis present

## 2021-02-07 DIAGNOSIS — L0390 Cellulitis, unspecified: Secondary | ICD-10-CM

## 2021-02-07 DIAGNOSIS — M86172 Other acute osteomyelitis, left ankle and foot: Secondary | ICD-10-CM | POA: Diagnosis present

## 2021-02-07 DIAGNOSIS — I5032 Chronic diastolic (congestive) heart failure: Secondary | ICD-10-CM | POA: Diagnosis present

## 2021-02-07 DIAGNOSIS — Z7982 Long term (current) use of aspirin: Secondary | ICD-10-CM

## 2021-02-07 DIAGNOSIS — Z880 Allergy status to penicillin: Secondary | ICD-10-CM

## 2021-02-07 DIAGNOSIS — E11628 Type 2 diabetes mellitus with other skin complications: Secondary | ICD-10-CM | POA: Diagnosis present

## 2021-02-07 DIAGNOSIS — E785 Hyperlipidemia, unspecified: Secondary | ICD-10-CM | POA: Diagnosis present

## 2021-02-07 DIAGNOSIS — Z882 Allergy status to sulfonamides status: Secondary | ICD-10-CM

## 2021-02-07 DIAGNOSIS — Z87891 Personal history of nicotine dependence: Secondary | ICD-10-CM

## 2021-02-07 DIAGNOSIS — Z7984 Long term (current) use of oral hypoglycemic drugs: Secondary | ICD-10-CM

## 2021-02-07 LAB — COMPREHENSIVE METABOLIC PANEL
ALT: 13 U/L (ref 0–44)
AST: 12 U/L — ABNORMAL LOW (ref 15–41)
Albumin: 2.3 g/dL — ABNORMAL LOW (ref 3.5–5.0)
Alkaline Phosphatase: 80 U/L (ref 38–126)
Anion gap: 12 (ref 5–15)
BUN: 16 mg/dL (ref 6–20)
CO2: 23 mmol/L (ref 22–32)
Calcium: 8.7 mg/dL — ABNORMAL LOW (ref 8.9–10.3)
Chloride: 96 mmol/L — ABNORMAL LOW (ref 98–111)
Creatinine, Ser: 0.83 mg/dL (ref 0.44–1.00)
GFR, Estimated: >60 mL/min (ref >60)
Glucose, Bld: 344 mg/dL — ABNORMAL HIGH (ref 70–99)
Potassium: 4.1 mmol/L (ref 3.5–5.1)
Sodium: 131 mmol/L — ABNORMAL LOW (ref 135–145)
Total Bilirubin: 0.7 mg/dL (ref 0.3–1.2)
Total Protein: 7 g/dL (ref 6.5–8.1)

## 2021-02-07 LAB — HEMOGLOBIN A1C
Hgb A1c MFr Bld: 12.4 % — ABNORMAL HIGH (ref 4.8–5.6)
Mean Plasma Glucose: 309 mg/dL

## 2021-02-07 LAB — CBC WITH DIFFERENTIAL/PLATELET
Abs Immature Granulocytes: 0.09 10*3/uL — ABNORMAL HIGH (ref 0.00–0.07)
Basophils Absolute: 0.1 10*3/uL (ref 0.0–0.1)
Basophils Relative: 1 %
Eosinophils Absolute: 0.3 10*3/uL (ref 0.0–0.5)
Eosinophils Relative: 2 %
HCT: 37 % (ref 36.0–46.0)
Hemoglobin: 11.9 g/dL — ABNORMAL LOW (ref 12.0–15.0)
Immature Granulocytes: 1 %
Lymphocytes Relative: 15 %
Lymphs Abs: 1.8 10*3/uL (ref 0.7–4.0)
MCH: 29.8 pg (ref 26.0–34.0)
MCHC: 32.2 g/dL (ref 30.0–36.0)
MCV: 92.7 fL (ref 80.0–100.0)
Monocytes Absolute: 1 10*3/uL (ref 0.1–1.0)
Monocytes Relative: 9 %
Neutro Abs: 8.9 10*3/uL — ABNORMAL HIGH (ref 1.7–7.7)
Neutrophils Relative %: 72 %
Platelets: 430 10*3/uL — ABNORMAL HIGH (ref 150–400)
RBC: 3.99 MIL/uL (ref 3.87–5.11)
RDW: 12.5 % (ref 11.5–15.5)
WBC: 12.1 10*3/uL — ABNORMAL HIGH (ref 4.0–10.5)
nRBC: 0 % (ref 0.0–0.2)

## 2021-02-07 LAB — SEDIMENTATION RATE: Sed Rate: 106 mm/h — ABNORMAL HIGH (ref 0–30)

## 2021-02-07 LAB — PROTIME-INR
INR: 1.2 (ref 0.8–1.2)
Prothrombin Time: 15.3 s — ABNORMAL HIGH (ref 11.4–15.2)

## 2021-02-07 LAB — LACTIC ACID, PLASMA: Lactic Acid, Venous: 1.5 mmol/L (ref 0.5–1.9)

## 2021-02-07 LAB — APTT: aPTT: 44 seconds — ABNORMAL HIGH (ref 24–36)

## 2021-02-07 MED ORDER — ACETAMINOPHEN 500 MG PO TABS
1000.0000 mg | ORAL_TABLET | Freq: Once | ORAL | Status: AC
Start: 2021-02-07 — End: 2021-02-07
  Administered 2021-02-07: 1000 mg via ORAL
  Filled 2021-02-07: qty 2

## 2021-02-07 MED ORDER — LACTATED RINGERS IV BOLUS (SEPSIS)
1000.0000 mL | Freq: Once | INTRAVENOUS | Status: AC
  Administered 2021-02-07: 1000 mL via INTRAVENOUS

## 2021-02-07 MED ORDER — VANCOMYCIN HCL IN DEXTROSE 1-5 GM/200ML-% IV SOLN
1000.0000 mg | Freq: Once | INTRAVENOUS | Status: AC
  Administered 2021-02-08: 1000 mg via INTRAVENOUS
  Filled 2021-02-07: qty 200

## 2021-02-07 MED ORDER — METRONIDAZOLE 500 MG/100ML IV SOLN
500.0000 mg | Freq: Once | INTRAVENOUS | Status: AC
  Administered 2021-02-07: 500 mg via INTRAVENOUS
  Filled 2021-02-07: qty 100

## 2021-02-07 MED ORDER — ONDANSETRON HCL 4 MG/2ML IJ SOLN
4.0000 mg | Freq: Once | INTRAMUSCULAR | Status: DC
  Filled 2021-02-07: qty 2

## 2021-02-07 MED ORDER — SODIUM CHLORIDE 0.9 % IV SOLN
2.0000 g | Freq: Once | INTRAVENOUS | Status: AC
  Administered 2021-02-07: 2 g via INTRAVENOUS
  Filled 2021-02-07: qty 2

## 2021-02-07 MED ORDER — HYDROMORPHONE HCL 1 MG/ML IJ SOLN
1.0000 mg | Freq: Once | INTRAMUSCULAR | Status: AC
Start: 2021-02-07 — End: 2021-02-07
  Administered 2021-02-07: 1 mg via INTRAVENOUS
  Filled 2021-02-07: qty 1

## 2021-02-07 MED ORDER — LACTATED RINGERS IV BOLUS (SEPSIS)
200.0000 mL | Freq: Once | INTRAVENOUS | Status: AC
  Administered 2021-02-08: 200 mL via INTRAVENOUS

## 2021-02-07 NOTE — H&P (Signed)
History and Physical    Patient: Nancy [name] [id number] DOB: 1965/11/02 DOA: 02/07/2021 DOS: the patient was seen and examined on 02/08/2021 PCP: Mechele Claude, FNP  Patient coming from: Home  Chief Complaint: Fever/ Diabetic foot wound.  Chief Complaint  Patient presents with   Fever    HPI: Nancy [name] is a 56 y.o. female with medical history significant of DM ii, asthma, cad, DFU. Hyperlipidemia presenting with DFU on the left ankle patient is scheduled to have surgery with podiatrist tomorrow.  Pain with weightbearing and 10 out of 10 severe nonradiating.  Podiatrist referred patient to the hospital.   Review of Systems: As mentioned in the history of present illness. All other systems reviewed and are negative. Review of Systems  Constitutional:  Positive for chills, fever and malaise/fatigue.  Musculoskeletal:  Positive for joint pain.   Past Medical History:  Diagnosis Date   Anxiety    Arthritis    r knee   Asthma    CAD (coronary artery disease)    a.) LHC 06/04/2009 at [facility]; non-obstructive CAD. b.) CTA with FFR 10/08/2018: extensive mixed plaque proximal to mid LAD (51-69%); Coronary Ca score 217; FFR 0.71 dPDA, 0.86 mLAD, 0.87 dLCx.   CCF (congestive cardiac failure) ([place]) 06/06/2008   a.) 30% EF. b.) TTE 06/03/2011: EF >55%; triv MR. c.) TTE 04/27/2018: EF 55%; mild LVH; triv PR, mild MR/TR; G1DD.   Complication of anesthesia    Eczema    Family history of adverse reaction to anesthesia    a.) PONV in mother and grandmother   GERD (gastroesophageal reflux disease) 05/10/2014   History of kidney stones    HLD (hyperlipidemia)    Migraines    Motion sickness    Panic attacks    Pneumonia    PONV (postoperative nausea and vomiting)    T2DM (type 2 diabetes mellitus) ([place])    Takotsubo cardiomyopathy / transient apical balooning syndrome / stress-induced cardiomyopathy 05/10/2014   Unspecified essential hypertension    Past Surgical  History:  Procedure Laterality Date   AMPUTATION TOE Left 03/16/2015   Procedure: left fifth toe amputation with incision and drainage;  Surgeon: Samara Deist, DPM;  Location: [facility];  Service: Podiatry;  Laterality: Left;   CARDIAC CATHETERIZATION Left 06/04/2009   Procedure: CARDIAC CATHETERIZATION; Location: [place]     kidney stone removal     KNEE ARTHROSCOPY W/ MENISCAL REPAIR     NOSE SURGERY  01/07/1989   due to fracture   TOE AMPUTATION     second toe   TONSILLECTOMY     TOTAL KNEE ARTHROPLASTY  01/11/2011   Procedure: TOTAL KNEE ARTHROPLASTY;  Surgeon: Alta Corning;  Location: [place];  Service: Orthopedics;  Laterality: Right;  COMPUTER ASSISTED TOTAL KNEE REPLACEMENT   Social History:  reports that she quit smoking about 5 years ago. Her smoking use included cigarettes. She has never used smokeless tobacco. She reports that she does not drink alcohol and does not use drugs.  Allergies  Allergen Reactions   Shellfish Allergy Anaphylaxis   Codeine Other (See Comments)    Migraine   Morphine And Related Other (See Comments)    Reaction:  Severe migraines    Percocet [Oxycodone-Acetaminophen] Nausea And Vomiting   Rosuvastatin     Other reaction(s): Muscle Pain   Penicillins Rash and Other (See Comments)    Has patient had a PCN reaction causing immediate rash, facial/tongue/throat swelling, SOB or lightheadedness with  hypotension: No Has patient had a PCN reaction causing severe rash involving mucus membranes or skin necrosis: No Has patient had a PCN reaction that required hospitalization No Has patient had a PCN reaction occurring within the last 10 years: No If all of the above answers are "NO", then may proceed with Cephalosporin use.   Sulfa Antibiotics Rash   Theophyllines Itching and Rash    Family History  Problem Relation Age of Onset   Transient ischemic attack Mother    Asthma Mother    Hypertension Mother    Lung  cancer Father    Hypertension Father    Asthma Sister    Anxiety disorder Sister    Stomach cancer Maternal Grandmother    Colon cancer Maternal Grandmother    Leukemia Maternal Grandmother    Melanoma Maternal Grandfather    Asthma Sister    Hypertension Brother    Cancer Other    Crohn's disease Other    GER disease Other    Breast cancer Neg Hx     Prior to Admission medications   Medication Sig Start Date End Date Taking? Authorizing Provider  acidophilus (RISAQUAD) CAPS capsule Take 1 capsule by mouth daily.   Yes [provider]  albuterol (PROVENTIL HFA;VENTOLIN HFA) 108 (90 Base) MCG/ACT inhaler Inhale 2 puffs into the lungs every 4 (four) hours as needed for wheezing or shortness of breath. Reported on 03/29/2015 03/29/15  Yes Margarita Rana, MD  aspirin EC 81 MG tablet Take 81 mg by mouth daily.   Yes [provider]  aspirin-acetaminophen-caffeine (EXCEDRIN MIGRAINE) [phone] MG tablet Take 2 tablets by mouth every 6 (six) hours as needed for migraine.   Yes [provider]  carvedilol (COREG) 3.125 MG tablet Take 3.125 mg by mouth 2 (two) times daily with a meal.     Yes [provider]  desvenlafaxine (PRISTIQ) 50 MG 24 hr tablet Take 50 mg by mouth daily. 01/15/21  Yes [provider]  fenofibrate 54 MG tablet Take 54 mg by mouth daily. 01/25/21  Yes [provider]  ibuprofen (ADVIL,MOTRIN) 800 MG tablet Take 800 mg by mouth every 8 (eight) hours as needed (for migraines).    Yes [provider]  lisinopril (ZESTRIL) 5 MG tablet Take 5 mg by mouth 2 (two) times daily.   Yes [provider]  metFORMIN (GLUCOPHAGE) 500 MG tablet Take 3 tablets (1,500 mg total) by mouth 2 (two) times daily with a meal. Pt takes two tablets with breakfast and three tablets with dinner. Patient taking differently: Take 1,000-1,500 mg by mouth See admin instructions. Take 1500 mg by mouth in the morning and 1000 mg in the  evening 03/29/15  Yes Margarita Rana, MD  [facility] 5 MG/0.5ML Pen Inject 5 mg into the skin every Sunday. 01/01/21  Yes [provider]  pantoprazole (PROTONIX) 40 MG tablet Take 40 mg by mouth daily. 02/01/20  Yes [provider]  pravastatin (PRAVACHOL) 40 MG tablet Take 1 tablet (40 mg total) by mouth at bedtime. 11/09/14  Yes Margarita Rana, MD  SANTYL ointment Apply 1 application topically daily. 01/24/21  Yes [provider]  traMADol (ULTRAM) 50 MG tablet Take 50 mg by mouth every 6 (six) hours as needed (for migraines).   Yes [provider]  doxycycline (VIBRA-TABS) 100 MG tablet Take 100 mg by mouth 2 (two) times daily. Patient not taking: Reported on 02/07/2021 01/24/21   [provider]  lisinopril (PRINIVIL,ZESTRIL) 10 MG tablet Take 1  tablet (10 mg total) by mouth 2 (two) times daily. Patient not taking: Reported on 02/02/2021 04/13/15   Margarita Rana, MD  Miconazole Nitrate 2 % POWD 200 mg by Does not apply route 2 (two) times daily as needed. Patient not taking: Reported on 02/02/2021 09/01/17   Rubie Maid, MD  montelukast (SINGULAIR) 10 MG tablet Take 10 mg by mouth at bedtime. 01/17/21   [provider]  nystatin (MYCOSTATIN/NYSTOP) powder Apply topically 4 (four) times daily. Patient taking differently: Apply 1 application topically daily as needed (irritation). 09/01/17   Rubie Maid, MD  ondansetron (ZOFRAN) 4 MG tablet Take 4 mg by mouth every 8 (eight) hours as needed for vomiting or nausea. 08/29/15   [provider]    Physical Exam: Vitals:   02/07/21 2109 02/07/21 2156 02/07/21 2200 02/07/21 2345  BP: (!) 181/87 (!) 176/84 (!) 176/84 (!) 147/76  Pulse: (!) 115 (!) 105 (!) 103 94  Resp: 18 16  19   Temp:  (!) 102.6 F (39.2 C)    TempSrc:  Oral    SpO2: 98% 96% 96% 95%  Weight:      Height:      Upon initial evaluation patient meets sepsis criteria.  Blood cultures collected in the emergency room.   Data  Reviewed: CMP shows: Labs shows sodium of 131 chloride 96 glucose 344 normal creatinine of 0.83 serum calcium of 8.7 albumin of 2.3 AST of 12 ALT of 13. CBC shows a white count of 12.1 and hemoglobin 11.9 platelets of 430 neutrophil of 8.9.  Sed rate of 106 X-ray of the left foot shows posterior calculi in the fracture, pathological fracture related to calcaneal osteomyelitis with an overlying wound.. Chest x-ray is negative for any acute cardiopulmonary disease. In the emergency room patient received Tylenol, cefepime, Dilaudid, LR, metronidazole, vancomycin.  Assessment and Plan: * Diabetic foot ulcer ([place])- (present on admission) Pt coming to [place] from podiatry office. Her wound vac is with her.  Plan is for surgery in am . Npo after midnight.    Sepsis secondary to diabetic foot infection- (present on admission) Pt meets sepsis criteria. We will cont abx and follow cultures.      Diabetes mellitus, type 2 (HCC) Cont SSI and glycemic protocol.   Hyponatremia- (present on admission) Cont with MIVf at low rate.   Essential hypertension- (present on admission) Blood pressure (!) 147/76, pulse 94, temperature (!) 102.6 F (39.2 C), temperature source Oral, resp. rate 19, height 5\' 9"  (1.753 m), weight 109.3 kg, SpO2 95 %. cont pt n prn hydralazine and coreg.   Tobacco abuse- (present on admission) Nicotine patch.    Advance Care Planning:   Code Status: Full Code Full code.   Consults: None  Family Communication: Neece,Deborah (Significant other)  [phone] (Mobile)  Severity of Illness: The appropriate patient status for this patient is INPATIENT. Inpatient status is judged to be reasonable and necessary in order to provide the required intensity of service to ensure the patient's safety. The patient's presenting symptoms, physical exam findings, and initial radiographic and laboratory data in the context of their chronic comorbidities is felt to place them at high  risk for further clinical deterioration. Furthermore, it is not anticipated that the patient will be medically stable for discharge from the hospital within 2 midnights of admission.   * I certify that at the point of admission it is my clinical judgment that the patient will require inpatient hospital care spanning beyond 2 midnights from the point  of admission due to high intensity of service, high risk for further deterioration and high frequency of surveillance required.*  Author: Para Skeans, MD 02/08/2021 12:47 AM  For on call review [email or web address].

## 2021-02-07 NOTE — ED Provider Notes (Signed)
[facility] Provider Note    Event Date/Time   First MD Initiated Contact with Patient 02/07/21 2132     (approximate)   History   Fever   HPI  Nancy [name] is a 56 y.o. female here with fever, chills, left leg pain.  The patient has had a chronic wound to her left ankle that is actually scheduled to go to surgery with Dr. Vickki Muff tomorrow morning.  She states that over the last 24 hours, it has gotten increasingly, severely painful.  It is began having a significant foul discharge, and she has also developed redness streaking up her leg.  She has had body aches.  She has had increasingly severe aching, throbbing, 10 out of 10 leg pain.  She had difficulty walking on it due to pain today.  She called Dr. Fellows office and was told to come to the ER for further evaluation.     Physical Exam   Triage Vital Signs: ED Triage Vitals  Enc Vitals Group     BP 02/07/21 2109 (!) 181/87     Pulse Rate 02/07/21 2109 (!) 115     Resp 02/07/21 2109 18     Temp 02/07/21 2109 (!) 100.4 F (38 C)     Temp Source 02/07/21 2109 Oral     SpO2 02/07/21 2109 98 %     Weight 02/07/21 2109 241 lb (109.3 kg)     Height 02/07/21 2109 5\' 9"  (1.753 m)     Head Circumference --      Peak Flow --      Pain Score 02/07/21 2109 10     Pain Loc --      Pain Edu? --      Excl. in [place]? --     Most recent vital signs: Vitals:   02/08/21 0030 02/08/21 0100  BP: (!) 153/72 (!) 142/73  Pulse: 88 82  Resp: 20 12  Temp:    SpO2: 97% 94%     General: Awake, no distress.  CV:  Good peripheral perfusion.  Tachycardic.  No murmurs. Resp:  Normal effort.  Lungs clear to auscultation bilaterally. Abd:  No distention.  No tenderness. Other:  Large, foul-smelling, purulent ulceration/wound to the left heel extending to the tendon level.  There is marked warmth and induration with erythema extending to the mid l calf circumferentially.  2+ DP pulse.  Intact distal cap  refill.   ED Results / Procedures / Treatments   Labs (all labs ordered are listed, but only abnormal results are displayed) Labs Reviewed  COMPREHENSIVE METABOLIC PANEL - Abnormal; Notable for the following components:      Result Value   Sodium 131 (*)    Chloride 96 (*)    Glucose, Bld 344 (*)    Calcium 8.7 (*)    Albumin 2.3 (*)    AST 12 (*)    All other components within normal limits  CBC WITH DIFFERENTIAL/PLATELET - Abnormal; Notable for the following components:   WBC 12.1 (*)    Hemoglobin 11.9 (*)    Platelets 430 (*)    Neutro Abs 8.9 (*)    Abs Immature Granulocytes 0.09 (*)    All other components within normal limits  APTT - Abnormal; Notable for the following components:   aPTT 44 (*)    All other components within normal limits  PROTIME-INR - Abnormal; Notable for the following components:   Prothrombin Time 15.3 (*)    All  other components within normal limits  SEDIMENTATION RATE - Abnormal; Notable for the following components:   Sed Rate 106 (*)    All other components within normal limits  RESP PANEL BY RT-PCR (FLU A&B, COVID) ARPGX2  CULTURE, BLOOD (ROUTINE X 2)  CULTURE, BLOOD (ROUTINE X 2)  URINE CULTURE  LACTIC ACID, PLASMA  URINALYSIS, COMPLETE (UACMP) WITH MICROSCOPIC  C-REACTIVE PROTEIN  LACTIC ACID, PLASMA  HEMOGLOBIN A1C  PREALBUMIN  LACTIC ACID, PLASMA  HIV ANTIBODY (ROUTINE TESTING W REFLEX)  BASIC METABOLIC PANEL  CBC     EKG   RADIOLOGY CXR: Clear DG Ankle Left: Posterior calcaneal fx, possible osteo   I also independently reviewed and agree wit radiologist interpretations.   PROCEDURES:  Critical Care performed: Yes, see critical care procedure note(s)  .Critical Care Performed by: Duffy Bruce, MD Authorized by: Duffy Bruce, MD   Critical care provider statement:    Critical care time (minutes):  30   Critical care time was exclusive of:  Separately billable procedures and treating other patients    Critical care was necessary to treat or prevent imminent or life-threatening deterioration of the following conditions:  Circulatory failure, sepsis and cardiac failure   Critical care was time spent personally by me on the following activities:  Development of treatment plan with patient or surrogate, discussions with consultants, evaluation of patient's response to treatment, examination of patient, ordering and review of laboratory studies, ordering and review of radiographic studies, ordering and performing treatments and interventions, pulse oximetry, re-evaluation of patient's condition and review of old charts    MEDICATIONS ORDERED IN ED: Medications  lactated ringers bolus 1,000 mL (1,000 mLs Intravenous New Bag/Given 02/07/21 2342)    And  lactated ringers bolus 1,000 mL (1,000 mLs Intravenous New Bag/Given 02/07/21 2337)    And  lactated ringers bolus 200 mL (has no administration in time range)  ondansetron (ZOFRAN) injection 4 mg (4 mg Intravenous Not Given 02/07/21 2337)  carvedilol (COREG) tablet 3.125 mg (has no administration in time range)  fenofibrate tablet 54 mg (has no administration in time range)  montelukast (SINGULAIR) tablet 10 mg (has no administration in time range)  albuterol (VENTOLIN HFA) 108 (90 Base) MCG/ACT inhaler 2 puff (has no administration in time range)  pantoprazole (PROTONIX) EC tablet 40 mg (has no administration in time range)  acidophilus (RISAQUAD) capsule 1 capsule (has no administration in time range)  venlafaxine XR (EFFEXOR-XR) 24 hr capsule 37.5 mg (has no administration in time range)  pravastatin (PRAVACHOL) tablet 40 mg (has no administration in time range)  enoxaparin (LOVENOX) injection 40 mg (has no administration in time range)  0.9 %  sodium chloride infusion (has no administration in time range)  acetaminophen (TYLENOL) tablet 650 mg (has no administration in time range)    Or  acetaminophen (TYLENOL) suppository 650 mg (has no  administration in time range)  HYDROcodone-acetaminophen (NORCO/VICODIN) 5-325 MG per tablet 1-2 tablet (has no administration in time range)  morphine (PF) 2 MG/ML injection 2 mg (has no administration in time range)  docusate sodium (COLACE) capsule 100 mg (has no administration in time range)  polyethylene glycol (MIRALAX / GLYCOLAX) packet 17 g (has no administration in time range)  bisacodyl (DULCOLAX) EC tablet 5 mg (has no administration in time range)  ondansetron (ZOFRAN) tablet 4 mg (has no administration in time range)    Or  ondansetron (ZOFRAN) injection 4 mg (has no administration in time range)  nicotine (NICODERM CQ - dosed in mg/24  hours) patch 14 mg (has no administration in time range)  hydrALAZINE (APRESOLINE) injection 10 mg (has no administration in time range)  metroNIDAZOLE (FLAGYL) IVPB 500 mg (has no administration in time range)  0.9 %  sodium chloride infusion (has no administration in time range)  aprepitant (EMEND) capsule 40 mg (has no administration in time range)  clindamycin (CLEOCIN) IVPB 900 mg (has no administration in time range)  insulin aspart (novoLOG) injection 0-6 Units (has no administration in time range)  0.9 %  sodium chloride infusion (has no administration in time range)  vancomycin (VANCOREADY) IVPB 1500 mg/300 mL (has no administration in time range)  ceFEPIme (MAXIPIME) 2 g in sodium chloride 0.9 % 100 mL IVPB (has no administration in time range)  vancomycin (VANCOCIN) IVPB 1000 mg/200 mL premix (has no administration in time range)  ceFEPIme (MAXIPIME) 2 g in sodium chloride 0.9 % 100 mL IVPB (0 g Intravenous Stopped 02/08/21 0027)  metroNIDAZOLE (FLAGYL) IVPB 500 mg (0 mg Intravenous Stopped 02/08/21 0109)  vancomycin (VANCOCIN) IVPB 1000 mg/200 mL premix (1,000 mg Intravenous New Bag/Given 02/08/21 0031)  HYDROmorphone (DILAUDID) injection 1 mg (1 mg Intravenous Given 02/07/21 2321)  acetaminophen (TYLENOL) tablet 1,000 mg (1,000 mg Oral Given  02/07/21 2308)     IMPRESSION / MDM / ASSESSMENT AND PLAN / ED COURSE  I reviewed the triage vital signs and the nursing notes.                               The patient is on the cardiac monitor to evaluate for evidence of arrhythmia and/or significant heart rate changes.   MDM:  56 year old female with history of diabetes, prior osteomyelitis of her toe, here with foul-smelling left heel wound with fever.  Concern for sepsis secondary to diabetic foot ulcer with concern for osteomyelitis and now superimposed cellulitis extending to the mid lower leg.  Patient is febrile, tachycardic, but not hypotensive and is mentating well.  Lab work is pending but initial white count 12.1 with left shift consistent with her acute infection.  Plain film of the ankle also shows calcaneal fracture which I suspect is a sign of osteomyelitis.  Chest x-ray is clear.  Reviewed prior notes including preadmit notes from Dr. Vickki Muff for surgery tomorrow as well as podiatry notes most recently on 01/31/2021 noting her ulcer and infection.  Patient updated and is in agreement.  I also discussed the case with Dr. Alvera Singh partner who will make him aware.  He recommends n.p.o. at midnight for likely operative debridement.   MEDICATIONS GIVEN IN ED: Medications  lactated ringers bolus 1,000 mL (1,000 mLs Intravenous New Bag/Given 02/07/21 2342)    And  lactated ringers bolus 1,000 mL (1,000 mLs Intravenous New Bag/Given 02/07/21 2337)    And  lactated ringers bolus 200 mL (has no administration in time range)  ondansetron (ZOFRAN) injection 4 mg (4 mg Intravenous Not Given 02/07/21 2337)  carvedilol (COREG) tablet 3.125 mg (has no administration in time range)  fenofibrate tablet 54 mg (has no administration in time range)  montelukast (SINGULAIR) tablet 10 mg (has no administration in time range)  albuterol (VENTOLIN HFA) 108 (90 Base) MCG/ACT inhaler 2 puff (has no administration in time range)  pantoprazole  (PROTONIX) EC tablet 40 mg (has no administration in time range)  acidophilus (RISAQUAD) capsule 1 capsule (has no administration in time range)  venlafaxine XR (EFFEXOR-XR) 24 hr capsule 37.5 mg (has no  administration in time range)  pravastatin (PRAVACHOL) tablet 40 mg (has no administration in time range)  enoxaparin (LOVENOX) injection 40 mg (has no administration in time range)  0.9 %  sodium chloride infusion (has no administration in time range)  acetaminophen (TYLENOL) tablet 650 mg (has no administration in time range)    Or  acetaminophen (TYLENOL) suppository 650 mg (has no administration in time range)  HYDROcodone-acetaminophen (NORCO/VICODIN) 5-325 MG per tablet 1-2 tablet (has no administration in time range)  morphine (PF) 2 MG/ML injection 2 mg (has no administration in time range)  docusate sodium (COLACE) capsule 100 mg (has no administration in time range)  polyethylene glycol (MIRALAX / GLYCOLAX) packet 17 g (has no administration in time range)  bisacodyl (DULCOLAX) EC tablet 5 mg (has no administration in time range)  ondansetron (ZOFRAN) tablet 4 mg (has no administration in time range)    Or  ondansetron (ZOFRAN) injection 4 mg (has no administration in time range)  nicotine (NICODERM CQ - dosed in mg/24 hours) patch 14 mg (has no administration in time range)  hydrALAZINE (APRESOLINE) injection 10 mg (has no administration in time range)  metroNIDAZOLE (FLAGYL) IVPB 500 mg (has no administration in time range)  0.9 %  sodium chloride infusion (has no administration in time range)  aprepitant (EMEND) capsule 40 mg (has no administration in time range)  clindamycin (CLEOCIN) IVPB 900 mg (has no administration in time range)  insulin aspart (novoLOG) injection 0-6 Units (has no administration in time range)  0.9 %  sodium chloride infusion (has no administration in time range)  vancomycin (VANCOREADY) IVPB 1500 mg/300 mL (has no administration in time range)  ceFEPIme  (MAXIPIME) 2 g in sodium chloride 0.9 % 100 mL IVPB (has no administration in time range)  vancomycin (VANCOCIN) IVPB 1000 mg/200 mL premix (has no administration in time range)  ceFEPIme (MAXIPIME) 2 g in sodium chloride 0.9 % 100 mL IVPB (0 g Intravenous Stopped 02/08/21 0027)  metroNIDAZOLE (FLAGYL) IVPB 500 mg (0 mg Intravenous Stopped 02/08/21 0109)  vancomycin (VANCOCIN) IVPB 1000 mg/200 mL premix (1,000 mg Intravenous New Bag/Given 02/08/21 0031)  HYDROmorphone (DILAUDID) injection 1 mg (1 mg Intravenous Given 02/07/21 2321)  acetaminophen (TYLENOL) tablet 1,000 mg (1,000 mg Oral Given 02/07/21 2308)     Consults:  Dr. Luana Shu Podiatry case discussed via telephone Dr. Posey Pronto hospitalist   EMR reviewed  Prior Podiatry and PCP notes     FINAL CLINICAL IMPRESSION(S) / ED DIAGNOSES   Final diagnoses:  Sepsis due to cellulitis [facility])  Diabetic ulcer of left heel associated with type 2 diabetes mellitus, with bone involvement without evidence of necrosis ([place])     Rx / DC Orders   ED Discharge Orders     None        Note:  This document was prepared using Dragon voice recognition software and may include unintentional dictation errors.   Duffy Bruce, MD 02/08/21 [phone]

## 2021-02-07 NOTE — Progress Notes (Signed)
CODE SEPSIS - PHARMACY COMMUNICATION  **Broad Spectrum Antibiotics should be administered within 1 hour of Sepsis diagnosis**  Time Code Sepsis Called/Page Received: 2134  Antibiotics Ordered: cefepime,vanc  Time of 1st antibiotic administration:    Additional action taken by pharmacy: messaged RN then called RN at 2230.  RN going in to hang abx  If necessary, Name of Provider/Nurse Contacted: N.Zohbi    Angelique Blonder ,PharmD Clinical Pharmacist  02/07/2021  10:40 PM

## 2021-02-07 NOTE — Progress Notes (Signed)
PHARMACY -  BRIEF ANTIBIOTIC NOTE   Pharmacy has received consult(s) for cefepime and vancomycin from an ED provider.  The patient's profile has been reviewed for ht/wt/allergies/indication/available labs.    One time order(s) placed by MD for vancomycin 1 gm and cefepime 2 gm  Further antibiotics/pharmacy consults should be ordered by admitting physician if indicated.                       Thank you, Emylia Latella A 02/07/2021  9:41 PM

## 2021-02-07 NOTE — ED Triage Notes (Signed)
Pt to triage via wheelchair. Pt has a fever for 4 days.  Pt has an open wound on left foot.  Pt is diabetic.  Pt to have surgery tomorrow by dr Ether Griffins.  Pt alert.

## 2021-02-08 ENCOUNTER — Inpatient Hospital Stay: Payer: 59 | Admitting: Urgent Care

## 2021-02-08 ENCOUNTER — Ambulatory Visit: Admission: RE | Admit: 2021-02-08 | Payer: 59 | Source: Home / Self Care | Admitting: Podiatry

## 2021-02-08 ENCOUNTER — Encounter
Admission: EM | Disposition: A | Discharge status: Home Health | Payer: Self-pay | Source: Home / Self Care | Attending: Family Medicine

## 2021-02-08 ENCOUNTER — Other Ambulatory Visit: Payer: Self-pay

## 2021-02-08 ENCOUNTER — Inpatient Hospital Stay: Payer: 59

## 2021-02-08 ENCOUNTER — Encounter: Payer: Self-pay | Admitting: Internal Medicine

## 2021-02-08 DIAGNOSIS — A419 Sepsis, unspecified organism: Secondary | ICD-10-CM | POA: Diagnosis present

## 2021-02-08 DIAGNOSIS — L97424 Non-pressure chronic ulcer of left heel and midfoot with necrosis of bone: Secondary | ICD-10-CM | POA: Diagnosis not present

## 2021-02-08 DIAGNOSIS — E1142 Type 2 diabetes mellitus with diabetic polyneuropathy: Secondary | ICD-10-CM | POA: Diagnosis not present

## 2021-02-08 DIAGNOSIS — K219 Gastro-esophageal reflux disease without esophagitis: Secondary | ICD-10-CM | POA: Diagnosis present

## 2021-02-08 DIAGNOSIS — M009 Pyogenic arthritis, unspecified: Secondary | ICD-10-CM | POA: Diagnosis present

## 2021-02-08 DIAGNOSIS — E11621 Type 2 diabetes mellitus with foot ulcer: Secondary | ICD-10-CM | POA: Diagnosis present

## 2021-02-08 DIAGNOSIS — I10 Essential (primary) hypertension: Secondary | ICD-10-CM | POA: Diagnosis not present

## 2021-02-08 DIAGNOSIS — E1165 Type 2 diabetes mellitus with hyperglycemia: Secondary | ICD-10-CM | POA: Diagnosis present

## 2021-02-08 DIAGNOSIS — Z01812 Encounter for preprocedural laboratory examination: Secondary | ICD-10-CM

## 2021-02-08 DIAGNOSIS — E871 Hypo-osmolality and hyponatremia: Secondary | ICD-10-CM | POA: Diagnosis present

## 2021-02-08 DIAGNOSIS — E11610 Type 2 diabetes mellitus with diabetic neuropathic arthropathy: Secondary | ICD-10-CM | POA: Diagnosis present

## 2021-02-08 DIAGNOSIS — E1169 Type 2 diabetes mellitus with other specified complication: Secondary | ICD-10-CM | POA: Diagnosis present

## 2021-02-08 DIAGNOSIS — B952 Enterococcus as the cause of diseases classified elsewhere: Secondary | ICD-10-CM | POA: Diagnosis present

## 2021-02-08 DIAGNOSIS — D638 Anemia in other chronic diseases classified elsewhere: Secondary | ICD-10-CM | POA: Diagnosis present

## 2021-02-08 DIAGNOSIS — E11628 Type 2 diabetes mellitus with other skin complications: Secondary | ICD-10-CM | POA: Diagnosis present

## 2021-02-08 DIAGNOSIS — Z20822 Contact with and (suspected) exposure to covid-19: Secondary | ICD-10-CM | POA: Diagnosis present

## 2021-02-08 DIAGNOSIS — I110 Hypertensive heart disease with heart failure: Secondary | ICD-10-CM | POA: Diagnosis present

## 2021-02-08 DIAGNOSIS — M84472A Pathological fracture, left ankle, initial encounter for fracture: Secondary | ICD-10-CM | POA: Diagnosis present

## 2021-02-08 DIAGNOSIS — J45909 Unspecified asthma, uncomplicated: Secondary | ICD-10-CM | POA: Diagnosis present

## 2021-02-08 DIAGNOSIS — L02612 Cutaneous abscess of left foot: Secondary | ICD-10-CM | POA: Diagnosis present

## 2021-02-08 DIAGNOSIS — L97429 Non-pressure chronic ulcer of left heel and midfoot with unspecified severity: Secondary | ICD-10-CM | POA: Diagnosis not present

## 2021-02-08 DIAGNOSIS — Z720 Tobacco use: Secondary | ICD-10-CM | POA: Diagnosis not present

## 2021-02-08 DIAGNOSIS — E669 Obesity, unspecified: Secondary | ICD-10-CM | POA: Diagnosis present

## 2021-02-08 DIAGNOSIS — L03116 Cellulitis of left lower limb: Secondary | ICD-10-CM | POA: Diagnosis present

## 2021-02-08 DIAGNOSIS — I5032 Chronic diastolic (congestive) heart failure: Secondary | ICD-10-CM | POA: Diagnosis present

## 2021-02-08 DIAGNOSIS — K3184 Gastroparesis: Secondary | ICD-10-CM | POA: Diagnosis present

## 2021-02-08 DIAGNOSIS — L089 Local infection of the skin and subcutaneous tissue, unspecified: Secondary | ICD-10-CM | POA: Diagnosis not present

## 2021-02-08 DIAGNOSIS — M86172 Other acute osteomyelitis, left ankle and foot: Secondary | ICD-10-CM | POA: Diagnosis present

## 2021-02-08 DIAGNOSIS — E785 Hyperlipidemia, unspecified: Secondary | ICD-10-CM | POA: Diagnosis present

## 2021-02-08 DIAGNOSIS — D75839 Thrombocytosis, unspecified: Secondary | ICD-10-CM | POA: Diagnosis present

## 2021-02-08 HISTORY — DX: Migraine, unspecified, not intractable, without status migrainosus: G43.909

## 2021-02-08 HISTORY — DX: Motion sickness, initial encounter: T75.3XXA

## 2021-02-08 HISTORY — PX: IRRIGATION AND DEBRIDEMENT FOOT: SHX6602

## 2021-02-08 HISTORY — DX: Panic disorder (episodic paroxysmal anxiety): F41.0

## 2021-02-08 HISTORY — DX: Hyperlipidemia, unspecified: E78.5

## 2021-02-08 HISTORY — DX: Type 2 diabetes mellitus with foot ulcer: E11.621

## 2021-02-08 HISTORY — DX: Atherosclerotic heart disease of native coronary artery without angina pectoris: I25.10

## 2021-02-08 HISTORY — DX: Type 2 diabetes mellitus without complications: E11.9

## 2021-02-08 LAB — CBG MONITORING, ED
Glucose-Capillary: 218 mg/dL — ABNORMAL HIGH (ref 70–99)
Glucose-Capillary: 248 mg/dL — ABNORMAL HIGH (ref 70–99)

## 2021-02-08 LAB — GLUCOSE, CAPILLARY
Glucose-Capillary: 191 mg/dL — ABNORMAL HIGH (ref 70–99)
Glucose-Capillary: 245 mg/dL — ABNORMAL HIGH (ref 70–99)

## 2021-02-08 LAB — BASIC METABOLIC PANEL
Anion gap: 8 (ref 5–15)
BUN: 15 mg/dL (ref 6–20)
CO2: 24 mmol/L (ref 22–32)
Calcium: 8.2 mg/dL — ABNORMAL LOW (ref 8.9–10.3)
Chloride: 98 mmol/L (ref 98–111)
Creatinine, Ser: 0.66 mg/dL (ref 0.44–1.00)
GFR, Estimated: >60 mL/min (ref >60)
Glucose, Bld: 245 mg/dL — ABNORMAL HIGH (ref 70–99)
Potassium: 4 mmol/L (ref 3.5–5.1)
Sodium: 130 mmol/L — ABNORMAL LOW (ref 135–145)

## 2021-02-08 LAB — URINALYSIS, COMPLETE (UACMP) WITH MICROSCOPIC
Bacteria, UA: NONE SEEN
Glucose, UA: >1000 mg/dL — AB
Ketones, ur: 80 mg/dL — AB
Leukocytes,Ua: NEGATIVE
Nitrite: NEGATIVE
Protein, ur: NEGATIVE mg/dL
Specific Gravity, Urine: 1.015 (ref 1.005–1.030)
pH: 5 (ref 5.0–8.0)

## 2021-02-08 LAB — HIV ANTIBODY (ROUTINE TESTING W REFLEX): HIV Screen 4th Generation wRfx: NONREACTIVE

## 2021-02-08 LAB — CBC
HCT: 35.5 % — ABNORMAL LOW (ref 36.0–46.0)
Hemoglobin: 11.5 g/dL — ABNORMAL LOW (ref 12.0–15.0)
MCH: 29.9 pg (ref 26.0–34.0)
MCHC: 32.4 g/dL (ref 30.0–36.0)
MCV: 92.2 fL (ref 80.0–100.0)
Platelets: 469 10*3/uL — ABNORMAL HIGH (ref 150–400)
RBC: 3.85 MIL/uL — ABNORMAL LOW (ref 3.87–5.11)
RDW: 12.9 % (ref 11.5–15.5)
WBC: 11 10*3/uL — ABNORMAL HIGH (ref 4.0–10.5)
nRBC: 0 % (ref 0.0–0.2)

## 2021-02-08 LAB — PREALBUMIN: Prealbumin: <5 mg/dL — ABNORMAL LOW (ref 18–38)

## 2021-02-08 LAB — C-REACTIVE PROTEIN: CRP: 36.7 mg/dL — ABNORMAL HIGH (ref <1.0)

## 2021-02-08 LAB — RESP PANEL BY RT-PCR (FLU A&B, COVID) ARPGX2
Influenza A by PCR: NEGATIVE
Influenza B by PCR: NEGATIVE
SARS Coronavirus 2 by RT PCR: NEGATIVE

## 2021-02-08 LAB — LACTIC ACID, PLASMA
Lactic Acid, Venous: 8.5 mmol/L (ref 0.5–1.9)
Lactic Acid, Venous: 0.9 mmol/L (ref 0.5–1.9)

## 2021-02-08 SURGERY — IRRIGATION AND DEBRIDEMENT FOOT
Anesthesia: General | Site: Heel | Laterality: Left

## 2021-02-08 MED ORDER — MIDAZOLAM HCL 2 MG/2ML IJ SOLN
INTRAMUSCULAR | Status: AC
  Filled 2021-02-08: qty 2

## 2021-02-08 MED ORDER — FENTANYL CITRATE (PF) 100 MCG/2ML IJ SOLN
INTRAMUSCULAR | Status: AC
  Filled 2021-02-08: qty 2

## 2021-02-08 MED ORDER — LIDOCAINE HCL (PF) 2 % IJ SOLN
INTRAMUSCULAR | Status: AC
  Filled 2021-02-08: qty 5

## 2021-02-08 MED ORDER — SODIUM CHLORIDE 0.9 % IV SOLN: INTRAVENOUS | Status: DC

## 2021-02-08 MED ORDER — VENLAFAXINE HCL ER 37.5 MG PO CP24
37.5000 mg | ORAL_CAPSULE | Freq: Every day | ORAL | Status: DC
  Administered 2021-02-09 – 2021-02-18 (×10): 37.5 mg via ORAL
  Filled 2021-02-08 (×10): qty 1

## 2021-02-08 MED ORDER — PROPOFOL 500 MG/50ML IV EMUL
INTRAVENOUS | Status: AC
  Filled 2021-02-08: qty 100

## 2021-02-08 MED ORDER — NICOTINE 14 MG/24HR TD PT24
14.0000 mg | MEDICATED_PATCH | Freq: Every day | TRANSDERMAL | Status: DC
  Filled 2021-02-08 (×4): qty 1

## 2021-02-08 MED ORDER — LIDOCAINE HCL (PF) 1 % IJ SOLN
INTRAMUSCULAR | Status: AC
  Filled 2021-02-08: qty 30

## 2021-02-08 MED ORDER — MONTELUKAST SODIUM 10 MG PO TABS
10.0000 mg | ORAL_TABLET | Freq: Every day | ORAL | Status: DC
  Administered 2021-02-08 – 2021-02-17 (×11): 10 mg via ORAL
  Filled 2021-02-08 (×11): qty 1

## 2021-02-08 MED ORDER — ACETAMINOPHEN 650 MG RE SUPP: 650.0000 mg | Freq: Four times a day (QID) | RECTAL | Status: DC | PRN

## 2021-02-08 MED ORDER — ONDANSETRON HCL 4 MG/2ML IJ SOLN
4.0000 mg | Freq: Four times a day (QID) | INTRAMUSCULAR | Status: DC | PRN
  Administered 2021-02-08 – 2021-02-09 (×2): 4 mg via INTRAVENOUS
  Filled 2021-02-08 (×3): qty 2

## 2021-02-08 MED ORDER — ACETAMINOPHEN 325 MG PO TABS: 650.0000 mg | ORAL_TABLET | Freq: Four times a day (QID) | ORAL | Status: DC | PRN

## 2021-02-08 MED ORDER — LISINOPRIL 5 MG PO TABS
5.0000 mg | ORAL_TABLET | Freq: Two times a day (BID) | ORAL | Status: DC
  Administered 2021-02-08 – 2021-02-18 (×20): 5 mg via ORAL
  Filled 2021-02-08 (×21): qty 1

## 2021-02-08 MED ORDER — PANTOPRAZOLE SODIUM 40 MG PO TBEC
40.0000 mg | DELAYED_RELEASE_TABLET | Freq: Every day | ORAL | Status: DC
  Administered 2021-02-09 – 2021-02-18 (×10): 40 mg via ORAL
  Filled 2021-02-08 (×10): qty 1

## 2021-02-08 MED ORDER — ENOXAPARIN SODIUM 60 MG/0.6ML IJ SOSY
0.5000 mg/kg | PREFILLED_SYRINGE | INTRAMUSCULAR | Status: DC
  Administered 2021-02-09 – 2021-02-17 (×8): 55 mg via SUBCUTANEOUS
  Filled 2021-02-08 (×8): qty 0.6

## 2021-02-08 MED ORDER — ASCORBIC ACID 500 MG PO TABS
500.0000 mg | ORAL_TABLET | Freq: Two times a day (BID) | ORAL | Status: DC
  Administered 2021-02-09 – 2021-02-18 (×19): 500 mg via ORAL
  Filled 2021-02-08 (×19): qty 1

## 2021-02-08 MED ORDER — MIDAZOLAM HCL 2 MG/2ML IJ SOLN
INTRAMUSCULAR | Status: DC | PRN
  Administered 2021-02-08: 2 mg via INTRAVENOUS

## 2021-02-08 MED ORDER — ENOXAPARIN SODIUM 40 MG/0.4ML IJ SOSY: 40.0000 mg | PREFILLED_SYRINGE | INTRAMUSCULAR | Status: DC

## 2021-02-08 MED ORDER — PRAVASTATIN SODIUM 20 MG PO TABS
40.0000 mg | ORAL_TABLET | Freq: Every day | ORAL | Status: DC
  Administered 2021-02-08 – 2021-02-17 (×11): 40 mg via ORAL
  Filled 2021-02-08 (×11): qty 2

## 2021-02-08 MED ORDER — FENTANYL CITRATE (PF) 100 MCG/2ML IJ SOLN
INTRAMUSCULAR | Status: DC | PRN
  Administered 2021-02-08 (×4): 25 ug via INTRAVENOUS

## 2021-02-08 MED ORDER — SODIUM CHLORIDE 0.9 % IR SOLN
Status: DC | PRN
  Administered 2021-02-08: 1000 mL

## 2021-02-08 MED ORDER — POLYETHYLENE GLYCOL 3350 17 G PO PACK: 17.0000 g | PACK | Freq: Every day | ORAL | Status: DC | PRN

## 2021-02-08 MED ORDER — BISACODYL 5 MG PO TBEC: 5.0000 mg | DELAYED_RELEASE_TABLET | Freq: Every day | ORAL | Status: DC | PRN

## 2021-02-08 MED ORDER — ALBUTEROL SULFATE (2.5 MG/3ML) 0.083% IN NEBU: 2.5000 mg | INHALATION_SOLUTION | RESPIRATORY_TRACT | Status: DC | PRN

## 2021-02-08 MED ORDER — ACETAMINOPHEN 10 MG/ML IV SOLN
INTRAVENOUS | Status: DC | PRN
Start: 2021-02-08 — End: 2021-02-08
  Administered 2021-02-08: 1000 mg via INTRAVENOUS

## 2021-02-08 MED ORDER — JUVEN PO PACK
1.0000 | PACK | Freq: Two times a day (BID) | ORAL | Status: DC
  Administered 2021-02-09 – 2021-02-18 (×14): 1 via ORAL
  Filled 2021-02-08 (×2): qty 1

## 2021-02-08 MED ORDER — INSULIN ASPART 100 UNIT/ML IJ SOLN
0.0000 [IU] | Freq: Three times a day (TID) | INTRAMUSCULAR | Status: DC
  Administered 2021-02-08: 1 [IU] via SUBCUTANEOUS
  Administered 2021-02-09: 2 [IU] via SUBCUTANEOUS
  Administered 2021-02-09: 1 [IU] via SUBCUTANEOUS
  Administered 2021-02-09 – 2021-02-10 (×3): 2 [IU] via SUBCUTANEOUS
  Administered 2021-02-10 – 2021-02-11 (×2): 3 [IU] via SUBCUTANEOUS
  Administered 2021-02-11 – 2021-02-12 (×4): 2 [IU] via SUBCUTANEOUS
  Administered 2021-02-12: 1 [IU] via SUBCUTANEOUS
  Filled 2021-02-08 (×13): qty 1

## 2021-02-08 MED ORDER — ACETAMINOPHEN 10 MG/ML IV SOLN
INTRAVENOUS | Status: AC
  Filled 2021-02-08: qty 100

## 2021-02-08 MED ORDER — BUPIVACAINE HCL (PF) 0.25 % IJ SOLN
INTRAMUSCULAR | Status: AC
  Filled 2021-02-08: qty 30

## 2021-02-08 MED ORDER — INSULIN ASPART 100 UNIT/ML IJ SOLN
4.0000 [IU] | Freq: Once | INTRAMUSCULAR | Status: AC
  Administered 2021-02-08: 4 [IU] via SUBCUTANEOUS

## 2021-02-08 MED ORDER — METRONIDAZOLE 500 MG/100ML IV SOLN
500.0000 mg | Freq: Three times a day (TID) | INTRAVENOUS | Status: DC
  Administered 2021-02-08 – 2021-02-09 (×3): 500 mg via INTRAVENOUS
  Filled 2021-02-08 (×4): qty 100

## 2021-02-08 MED ORDER — ONDANSETRON HCL 4 MG PO TABS: 4.0000 mg | ORAL_TABLET | Freq: Four times a day (QID) | ORAL | Status: DC | PRN

## 2021-02-08 MED ORDER — SODIUM CHLORIDE 0.9 % IV SOLN
INTRAVENOUS | Status: DC | PRN
  Administered 2021-02-15: 10 mL via INTRAVENOUS

## 2021-02-08 MED ORDER — INSULIN ASPART 100 UNIT/ML IJ SOLN
INTRAMUSCULAR | Status: AC
  Filled 2021-02-08: qty 1

## 2021-02-08 MED ORDER — VANCOMYCIN HCL IN DEXTROSE 1-5 GM/200ML-% IV SOLN
1000.0000 mg | Freq: Two times a day (BID) | INTRAVENOUS | Status: DC
  Administered 2021-02-09 – 2021-02-12 (×8): 1000 mg via INTRAVENOUS
  Filled 2021-02-08 (×10): qty 200

## 2021-02-08 MED ORDER — RISAQUAD PO CAPS
1.0000 | ORAL_CAPSULE | Freq: Every day | ORAL | Status: DC
  Administered 2021-02-09 – 2021-02-18 (×10): 1 via ORAL
  Filled 2021-02-08 (×10): qty 1

## 2021-02-08 MED ORDER — DIAZEPAM 2 MG PO TABS
2.0000 mg | ORAL_TABLET | Freq: Once | ORAL | Status: AC
  Administered 2021-02-08: 2 mg via ORAL
  Filled 2021-02-08: qty 1

## 2021-02-08 MED ORDER — ONDANSETRON HCL 4 MG/2ML IJ SOLN
INTRAMUSCULAR | Status: DC | PRN
  Administered 2021-02-08: 4 mg via INTRAVENOUS

## 2021-02-08 MED ORDER — METFORMIN HCL 500 MG PO TABS
1000.0000 mg | ORAL_TABLET | Freq: Every day | ORAL | Status: DC
  Administered 2021-02-08 – 2021-02-09 (×2): 1000 mg via ORAL
  Filled 2021-02-08 (×3): qty 2

## 2021-02-08 MED ORDER — LIDOCAINE-EPINEPHRINE 1 %-1:100000 IJ SOLN
INTRAMUSCULAR | Status: AC
  Filled 2021-02-08: qty 1

## 2021-02-08 MED ORDER — METFORMIN HCL 500 MG PO TABS
1500.0000 mg | ORAL_TABLET | Freq: Every day | ORAL | Status: DC
  Administered 2021-02-09 – 2021-02-10 (×2): 1500 mg via ORAL
  Filled 2021-02-08 (×2): qty 3

## 2021-02-08 MED ORDER — SACCHAROMYCES BOULARDII 250 MG PO CAPS
250.0000 mg | ORAL_CAPSULE | Freq: Two times a day (BID) | ORAL | Status: DC
  Administered 2021-02-08 – 2021-02-18 (×20): 250 mg via ORAL
  Filled 2021-02-08 (×21): qty 1

## 2021-02-08 MED ORDER — ONDANSETRON HCL 4 MG/2ML IJ SOLN
INTRAMUSCULAR | Status: AC
  Filled 2021-02-08: qty 2

## 2021-02-08 MED ORDER — KETAMINE HCL 10 MG/ML IJ SOLN
INTRAMUSCULAR | Status: DC | PRN
  Administered 2021-02-08 (×2): 25 mg via INTRAVENOUS

## 2021-02-08 MED ORDER — PROPOFOL 500 MG/50ML IV EMUL
INTRAVENOUS | Status: DC | PRN
  Administered 2021-02-08: 50 mg via INTRAVENOUS
  Administered 2021-02-08: 120 ug/kg/min via INTRAVENOUS

## 2021-02-08 MED ORDER — ENSURE MAX PROTEIN PO LIQD
11.0000 [oz_av] | Freq: Every day | ORAL | Status: DC
  Administered 2021-02-09 – 2021-02-17 (×9): 11 [oz_av] via ORAL
  Filled 2021-02-08: qty 330

## 2021-02-08 MED ORDER — OXYCODONE HCL 5 MG PO TABS
10.0000 mg | ORAL_TABLET | Freq: Once | ORAL | Status: AC
  Administered 2021-02-08: 10 mg via ORAL

## 2021-02-08 MED ORDER — ADULT MULTIVITAMIN W/MINERALS CH
1.0000 | ORAL_TABLET | Freq: Every day | ORAL | Status: DC
  Administered 2021-02-09 – 2021-02-18 (×10): 1 via ORAL
  Filled 2021-02-08 (×10): qty 1

## 2021-02-08 MED ORDER — CARVEDILOL 3.125 MG PO TABS
3.1250 mg | ORAL_TABLET | Freq: Two times a day (BID) | ORAL | Status: DC
  Administered 2021-02-08 – 2021-02-18 (×20): 3.125 mg via ORAL
  Filled 2021-02-08 (×21): qty 1

## 2021-02-08 MED ORDER — VANCOMYCIN HCL 1000 MG IV SOLR
INTRAVENOUS | Status: DC | PRN
  Administered 2021-02-08: 1000 mg

## 2021-02-08 MED ORDER — ZINC SULFATE 220 (50 ZN) MG PO CAPS
220.0000 mg | ORAL_CAPSULE | Freq: Every day | ORAL | Status: DC
  Administered 2021-02-09 – 2021-02-18 (×10): 220 mg via ORAL
  Filled 2021-02-08 (×10): qty 1

## 2021-02-08 MED ORDER — FENTANYL CITRATE PF 50 MCG/ML IJ SOSY
12.5000 ug | PREFILLED_SYRINGE | Freq: Once | INTRAMUSCULAR | Status: AC
  Administered 2021-02-08: 12.5 ug via INTRAVENOUS
  Filled 2021-02-08: qty 1

## 2021-02-08 MED ORDER — LIDOCAINE HCL (CARDIAC) PF 100 MG/5ML IV SOSY
PREFILLED_SYRINGE | INTRAVENOUS | Status: DC | PRN
Start: 2021-02-08 — End: 2021-02-08
  Administered 2021-02-08: 50 mg via INTRAVENOUS

## 2021-02-08 MED ORDER — SODIUM CHLORIDE 0.9 % IV SOLN
2.0000 g | Freq: Three times a day (TID) | INTRAVENOUS | Status: DC
  Administered 2021-02-08 – 2021-02-09 (×3): 2 g via INTRAVENOUS
  Filled 2021-02-08 (×6): qty 2

## 2021-02-08 MED ORDER — ONDANSETRON HCL 4 MG PO TABS: 4.0000 mg | ORAL_TABLET | Freq: Three times a day (TID) | ORAL | Status: DC | PRN

## 2021-02-08 MED ORDER — HYDROCODONE-ACETAMINOPHEN 5-325 MG PO TABS
ORAL_TABLET | ORAL | Status: AC
  Filled 2021-02-08: qty 2

## 2021-02-08 MED ORDER — MEPERIDINE HCL 25 MG/ML IJ SOLN: 6.2500 mg | INTRAMUSCULAR | Status: DC | PRN

## 2021-02-08 MED ORDER — VANCOMYCIN HCL 1500 MG/300ML IV SOLN
1500.0000 mg | Freq: Once | INTRAVENOUS | Status: AC
  Administered 2021-02-08: 1500 mg via INTRAVENOUS
  Filled 2021-02-08: qty 300

## 2021-02-08 MED ORDER — VANCOMYCIN HCL 1000 MG IV SOLR
INTRAVENOUS | Status: AC
  Filled 2021-02-08: qty 20

## 2021-02-08 MED ORDER — BUPIVACAINE HCL (PF) 0.5 % IJ SOLN
INTRAMUSCULAR | Status: AC
  Filled 2021-02-08: qty 30

## 2021-02-08 MED ORDER — FENOFIBRATE 54 MG PO TABS
54.0000 mg | ORAL_TABLET | Freq: Every day | ORAL | Status: DC
Start: 2021-02-08 — End: 2021-02-18
  Administered 2021-02-09 – 2021-02-18 (×10): 54 mg via ORAL
  Filled 2021-02-08 (×10): qty 1

## 2021-02-08 MED ORDER — HYDROCODONE-ACETAMINOPHEN 5-325 MG PO TABS
1.0000 | ORAL_TABLET | ORAL | Status: DC | PRN
  Administered 2021-02-08 – 2021-02-09 (×4): 2 via ORAL
  Filled 2021-02-08 (×4): qty 2

## 2021-02-08 MED ORDER — DIAZEPAM 5 MG PO TABS
ORAL_TABLET | ORAL | Status: AC
  Filled 2021-02-08: qty 1

## 2021-02-08 MED ORDER — 0.9 % SODIUM CHLORIDE (POUR BTL) OPTIME
TOPICAL | Status: DC | PRN
  Administered 2021-02-08: 1000 mL

## 2021-02-08 MED ORDER — KETAMINE HCL 50 MG/5ML IJ SOSY
PREFILLED_SYRINGE | INTRAMUSCULAR | Status: AC
  Filled 2021-02-08: qty 5

## 2021-02-08 MED ORDER — CHLORHEXIDINE GLUCONATE 0.12 % MT SOLN: 15.0000 mL | Freq: Once | OROMUCOSAL | Status: DC

## 2021-02-08 MED ORDER — FENTANYL CITRATE (PF) 100 MCG/2ML IJ SOLN
25.0000 ug | INTRAMUSCULAR | Status: DC | PRN
  Administered 2021-02-08 (×3): 50 ug via INTRAVENOUS

## 2021-02-08 MED ORDER — DOCUSATE SODIUM 100 MG PO CAPS
100.0000 mg | ORAL_CAPSULE | Freq: Two times a day (BID) | ORAL | Status: DC
  Administered 2021-02-08 – 2021-02-17 (×5): 100 mg via ORAL
  Filled 2021-02-08 (×16): qty 1

## 2021-02-08 MED ORDER — HYDRALAZINE HCL 20 MG/ML IJ SOLN: 10.0000 mg | Freq: Four times a day (QID) | INTRAMUSCULAR | Status: DC | PRN

## 2021-02-08 MED ORDER — CLINDAMYCIN PHOSPHATE 900 MG/50ML IV SOLN: 900.0000 mg | INTRAVENOUS | Status: DC

## 2021-02-08 MED ORDER — LIDOCAINE-EPINEPHRINE 1 %-1:100000 IJ SOLN
INTRAMUSCULAR | Status: DC | PRN
  Administered 2021-02-08: 10 mL

## 2021-02-08 MED ORDER — MORPHINE SULFATE (PF) 2 MG/ML IV SOLN
2.0000 mg | INTRAVENOUS | Status: DC | PRN
  Administered 2021-02-08 – 2021-02-15 (×24): 2 mg via INTRAVENOUS
  Filled 2021-02-08 (×26): qty 1

## 2021-02-08 MED ORDER — OXYCODONE HCL 5 MG PO TABS
ORAL_TABLET | ORAL | Status: AC
  Filled 2021-02-08: qty 2

## 2021-02-08 MED ORDER — APREPITANT 40 MG PO CAPS
40.0000 mg | ORAL_CAPSULE | Freq: Once | ORAL | Status: DC
  Filled 2021-02-08: qty 1

## 2021-02-08 MED ORDER — ORAL CARE MOUTH RINSE: 15.0000 mL | Freq: Once | OROMUCOSAL | Status: DC

## 2021-02-08 SURGICAL SUPPLY — 67 items
"PENCIL ELECTRO HAND CTR " (MISCELLANEOUS) ×1 IMPLANT
BLADE OSC/SAGITTAL MD 5.5X18 (BLADE) IMPLANT
BLADE OSCILLATING/SAGITTAL (BLADE)
BLADE SURG 15 STRL LF DISP TIS (BLADE) ×3 IMPLANT
BLADE SURG 15 STRL SS (BLADE) ×3
BLADE SW THK.38XMED LNG THN (BLADE) IMPLANT
BNDG COHESIVE 4X5 TAN ST LF (GAUZE/BANDAGES/DRESSINGS) ×4 IMPLANT
BNDG COHESIVE 6X5 TAN ST LF (GAUZE/BANDAGES/DRESSINGS) ×4 IMPLANT
BNDG CONFORM 2 STRL LF (GAUZE/BANDAGES/DRESSINGS) ×4 IMPLANT
BNDG CONFORM 3 STRL LF (GAUZE/BANDAGES/DRESSINGS) ×4 IMPLANT
BNDG ELASTIC 4X5.8 VLCR STR LF (GAUZE/BANDAGES/DRESSINGS) ×4 IMPLANT
BNDG ESMARK 4X12 TAN STRL LF (GAUZE/BANDAGES/DRESSINGS) ×4 IMPLANT
BNDG GAUZE ELAST 4 BULKY (GAUZE/BANDAGES/DRESSINGS) ×4 IMPLANT
CANISTER WOUND CARE 500ML ATS (WOUND CARE) ×2 IMPLANT
CUFF TOURN SGL QUICK 12 (TOURNIQUET CUFF) IMPLANT
CUFF TOURN SGL QUICK 18X4 (TOURNIQUET CUFF) IMPLANT
DRAPE FLUOR MINI C-ARM 54X84 (DRAPES) IMPLANT
DRAPE XRAY CASSETTE 23X24 (DRAPES) IMPLANT
DRSG MEPILEX FLEX 3X3 (GAUZE/BANDAGES/DRESSINGS) IMPLANT
DURAPREP 26ML APPLICATOR (WOUND CARE) ×4 IMPLANT
ELECT REM PT RETURN 9FT ADLT (ELECTROSURGICAL) ×3
ELECTRODE REM PT RTRN 9FT ADLT (ELECTROSURGICAL) ×3 IMPLANT
GAUZE PACKING 1/4 X5 YD (GAUZE/BANDAGES/DRESSINGS) ×4 IMPLANT
GAUZE PACKING IODOFORM 1X5 (PACKING) ×4 IMPLANT
GAUZE SPONGE 4X4 12PLY STRL (GAUZE/BANDAGES/DRESSINGS) ×4 IMPLANT
GAUZE XEROFORM 1X8 LF (GAUZE/BANDAGES/DRESSINGS) ×4 IMPLANT
GLOVE SURG ENC MOIS LTX SZ7.5 (GLOVE) ×4 IMPLANT
GLOVE SURG UNDER LTX SZ8 (GLOVE) ×4 IMPLANT
GOWN STRL REUS W/ TWL XL LVL3 (GOWN DISPOSABLE) ×6 IMPLANT
GOWN STRL REUS W/TWL MED LVL3 (GOWN DISPOSABLE) ×8 IMPLANT
GOWN STRL REUS W/TWL XL LVL3 (GOWN DISPOSABLE) ×6
HANDPIECE VERSAJET DEBRIDEMENT (MISCELLANEOUS) ×2 IMPLANT
IV NS 1000ML (IV SOLUTION) ×3
IV NS 1000ML BAXH (IV SOLUTION) ×3 IMPLANT
IV NS IRRIG 3000ML ARTHROMATIC (IV SOLUTION) ×4 IMPLANT
KIT DRSG VAC SLVR GRANUFM (MISCELLANEOUS) ×2 IMPLANT
KIT STIMULAN RAPID CURE 5CC (Orthopedic Implant) ×2 IMPLANT
KIT TURNOVER KIT A (KITS) ×4 IMPLANT
LABEL OR SOLS (LABEL) ×4 IMPLANT
MANIFOLD NEPTUNE II (INSTRUMENTS) ×4 IMPLANT
NDL FILTER BLUNT 18X1 1/2 (NEEDLE) ×2 IMPLANT
NDL HYPO 25X1 1.5 SAFETY (NEEDLE) ×2 IMPLANT
NEEDLE FILTER BLUNT 18X 1/2SAF (NEEDLE) ×1
NEEDLE FILTER BLUNT 18X1 1/2 (NEEDLE) ×2 IMPLANT
NEEDLE HYPO 25X1 1.5 SAFETY (NEEDLE) ×3 IMPLANT
NS IRRIG 500ML POUR BTL (IV SOLUTION) ×4 IMPLANT
PACK EXTREMITY ARMC (MISCELLANEOUS) ×4 IMPLANT
PAD ABD DERMACEA PRESS 5X9 (GAUZE/BANDAGES/DRESSINGS) ×4 IMPLANT
PENCIL ELECTRO HAND CTR (MISCELLANEOUS) ×4 IMPLANT
PULSAVAC PLUS IRRIG FAN TIP (DISPOSABLE)
RASP SM TEAR CROSS CUT (RASP) IMPLANT
SHIELD FULL FACE ANTIFOG 7M (MISCELLANEOUS) ×4 IMPLANT
SOL PREP PVP 2OZ (MISCELLANEOUS) ×3
SOLUTION PREP PVP 2OZ (MISCELLANEOUS) ×3 IMPLANT
STOCKINETTE IMPERVIOUS 9X36 MD (GAUZE/BANDAGES/DRESSINGS) ×4 IMPLANT
SUT ETHILON 2 0 FS 18 (SUTURE) ×6 IMPLANT
SUT ETHILON 4-0 (SUTURE)
SUT ETHILON 4-0 FS2 18XMFL BLK (SUTURE)
SUT VIC AB 3-0 SH 27 (SUTURE)
SUT VIC AB 3-0 SH 27X BRD (SUTURE) ×2 IMPLANT
SUT VIC AB 4-0 FS2 27 (SUTURE) ×2 IMPLANT
SUTURE ETHLN 4-0 FS2 18XMF BLK (SUTURE) ×2 IMPLANT
SWAB CULTURE AMIES ANAERIB BLU (MISCELLANEOUS) IMPLANT
SYR 10ML LL (SYRINGE) ×8 IMPLANT
SYR 3ML LL SCALE MARK (SYRINGE) ×4 IMPLANT
TIP FAN IRRIG PULSAVAC PLUS (DISPOSABLE) ×2 IMPLANT
WATER STERILE IRR 500ML POUR (IV SOLUTION) ×4 IMPLANT

## 2021-02-08 NOTE — Transfer of Care (Signed)
Immediate Anesthesia Transfer of Care Note  Patient: Nancy [name]  Procedure(s) Performed: IRRIGATION AND DEBRIDEMENT FOOT - LFT HEEL ULCER (Left: Heel)  Patient Location: PACU  Anesthesia Type:General  Level of Consciousness: awake and alert   Airway & Oxygen Therapy: Patient Spontanous Breathing  Post-op Assessment: Report given to RN and Post -op Vital signs reviewed and stable  Post vital signs: Reviewed and stable  Last Vitals:  Vitals Value Taken Time  BP 151/80 02/08/21 0839  Temp    Pulse 83 02/08/21 0842  Resp 9 02/08/21 0842  SpO2 100 % 02/08/21 0842  Vitals shown include unvalidated device data.  Last Pain:  Vitals:   02/08/21 0624  TempSrc: Temporal  PainSc: 7          Complications: No notable events documented.

## 2021-02-08 NOTE — Progress Notes (Signed)
Daily Progress Note   Subjective  - Day of Surgery  Patient admitted overnight secondary to sepsis.  Had worsening pain into her left foot with the wound worsening.  Had developed a fever.  We had planned for 4 OR today which we did perform this morning.  Patient underwent debridement of posterior left heel necrotic ulceration with removal of bone.  Intraoperatively found to have septic arthritis of the ankle joint as well.  Deep wound cultures and pathology has been sent.  Objective Vitals:   02/08/21 0839 02/08/21 0930 02/08/21 1030 02/08/21 1130  BP:  137/82 (!) 146/78   Pulse: 85 70 77 72  Resp:  18 15 13   Temp: (!) 97.5 F (36.4 C)  (!) 97.4 F (36.3 C)   TempSrc:      SpO2: 100%     Weight:      Height:        Physical Exam: She has a large posterior left heel necrotic ulceration.  She is status postdebridement.  She has an ulcer that measures 8 x 6 cm with a depth of about 5 cm down to the level of the ankle joint.  Bone has been removed and sent for cultures and pathology.  Laboratory CBC    Component Value Date/Time   WBC 12.1 (H) 02/07/2021 2310   HGB 11.9 (L) 02/07/2021 2310   HGB 14.8 11/04/2016 1109   HCT 37.0 02/07/2021 2310   HCT 46.1 11/04/2016 1109   PLT 430 (H) 02/07/2021 2310   PLT 265 11/04/2016 1109    BMET    Component Value Date/Time   NA 131 (L) 02/07/2021 2310   NA 134 11/04/2016 1109   NA 133 (L) 08/21/2012 1523   K 4.1 02/07/2021 2310   K 4.1 08/21/2012 1523   CL 96 (L) 02/07/2021 2310   CL 102 08/21/2012 1523   CO2 23 02/07/2021 2310   CO2 26 08/21/2012 1523   GLUCOSE 344 (H) 02/07/2021 2310   GLUCOSE 306 (H) 08/21/2012 1523   BUN 16 02/07/2021 2310   BUN 16 11/04/2016 1109   BUN 17 08/21/2012 1523   CREATININE 0.83 02/07/2021 2310   CREATININE 0.69 08/21/2012 1523   CALCIUM 8.7 (L) 02/07/2021 2310   CALCIUM 8.8 08/21/2012 1523   GFRNONAA >60 02/07/2021 2310   GFRNONAA >60 08/21/2012 1523   GFRAA 117 11/04/2016 1109   GFRAA >60  08/21/2012 1523    Assessment/Planning: Osteomyelitis left heel with septic arthritis of the ankle Diabetes with neuropathy hemoglobin A1c above 12  I had a long discussion with 08/23/2012 today status post surgery.  She was awake and alert and oriented.  I discussed the poor prognosis given the fact she has such a severe infection of a large wound and infection of the bone as well as septic arthritis.  At this time we have removed most of the bulk of the necrotic dead tissue.  My concern is residual osteomyelitis and given the size of this wound this will be a very difficult wound to heal. We will have to continue to follow for now.  She may need further debridement and/or wound VAC placement if things seem to be encouraging to try to have any attempt at limb salvage.  We have previously discussed the possibility she may not heal the wound and may end up undergoing an amputation.  We will have to see how she responds currently.  Continue with current IV antibiotics.  Continue with nonweightbearing to the foot.  Nancy [name],  Rodel Glaspy A  02/08/2021, 11:59 AM

## 2021-02-08 NOTE — ED Notes (Signed)
This RN irrigated wound on left heel with saline, applied non-absorbant pads around foot, and wrapped with kerlix.

## 2021-02-08 NOTE — Consult Note (Signed)
WOC Nurse Consult Note: Reason for Consult: Lower Extremity Wound Podiatrist Gwyneth Revels taking patient to surgery per notes in record. Please direct all questions pertaining to the wound(s) to Dr. Ether Griffins.The patient was not seen and will not be followed. Please re-consult the WOC team if needed.  Helmut Muster, RN, MSN, CWOCN, CNS-BC, pager [phone]

## 2021-02-08 NOTE — Progress Notes (Signed)
Pharmacy Antibiotic Note  Nancy [name] is a 56 y.o. female admitted on 02/07/2021 with sepsis and diabetic foot ulcer .  Pharmacy has been consulted for Vanc, Cefepime dosing.  Plan: Cefepime 2 gm IV X 1 given in ED on 2/01 @ 2321. Cefepime 2 gm IV Q8H ordered to start on 2/02 @ 0700.  Vancomycin 1 gm IV X 1 given in ED on 2/02 @ 0031. Additional Vanc 1500 mg IV X 1 ordered to make total loading dose of 2500 mg.  Vancomycin 1 gm IV Q12H ordered to start on 2/02 @ 1300.   AUC = 522.4 Vanc trough = 14.9   Height: 5\' 9"  (175.3 cm) Weight: 109.3 kg (241 lb) IBW/kg (Calculated) : 66.2  Temp (24hrs), Avg:101.5 F (38.6 C), Min:100.4 F (38 C), Max:102.6 F (39.2 C)  Recent Labs  Lab 02/06/21 1528 02/07/21 2310  WBC 11.6* 12.1*  CREATININE 0.86 0.83  LATICACIDVEN  --  1.5    Estimated Creatinine Clearance: 99.6 mL/min (by C-G formula based on SCr of 0.83 mg/dL).    Allergies  Allergen Reactions   Shellfish Allergy Anaphylaxis   Codeine Other (See Comments)    Migraine   Morphine And Related Other (See Comments)    Reaction:  Severe migraines    Percocet [Oxycodone-Acetaminophen] Nausea And Vomiting   Rosuvastatin     Other reaction(s): Muscle Pain   Penicillins Rash and Other (See Comments)    Has patient had a PCN reaction causing immediate rash, facial/tongue/throat swelling, SOB or lightheadedness with hypotension: No Has patient had a PCN reaction causing severe rash involving mucus membranes or skin necrosis: No Has patient had a PCN reaction that required hospitalization No Has patient had a PCN reaction occurring within the last 10 years: No If all of the above answers are "NO", then may proceed with Cephalosporin use.   Sulfa Antibiotics Rash   Theophyllines Itching and Rash    Antimicrobials this admission:   >>    >>   Dose adjustments this admission:   Microbiology results:  BCx:   UCx:    Sputum:    MRSA PCR:   Thank you for allowing  pharmacy to be a part of this patients care.  Nancy [name] D 02/08/2021 12:55 AM

## 2021-02-08 NOTE — Progress Notes (Addendum)
Inpatient Diabetes Program Recommendations  AACE/ADA: New Consensus Statement on Inpatient Glycemic Control (2015)  Target Ranges:  Prepandial:   less than 140 mg/dL      Peak postprandial:   less than 180 mg/dL (1-2 hours)      Critically ill patients:  140 - 180 mg/dL   Lab Results  Component Value Date   GLUCAP 248 (H) 02/08/2021   HGBA1C 12.4 (H) 02/06/2021    Review of Glycemic Control  Latest Reference Range & Units 02/08/21 06:21 02/08/21 08:43  Glucose-Capillary 70 - 99 mg/dL 218 (H) 248 (H)   Diabetes history: DM 2 Outpatient Diabetes medications:  Metformin 1500 mg bid, Mounjaro 5 mg weekly, Novolin 70/30 70 units bid Current orders for Inpatient glycemic control:  Novolog 0-6 units tid with meals  Inpatient Diabetes Program Recommendations:   Note that A1C is 12.4%- It appears that patient needs insulin at home. May consider adding Semglee 20 units daily while in the hospital. Also consider adding Novolog meal coverage 3 units tid with meals (hold if patient eats less than 50% or NPO). Will discuss A1C with patient.   Thanks,  Adah Perl, RN, BC-ADM Inpatient Diabetes Coordinator Pager [phone]  (8a-5p)  Addendum [phone]-  Spoke with patient regarding DM management.  She states that she was taking both insulin and Mounjaro at home. She is not taking insulin consistently and admits she is not checking blood sugars very much.  She states that she is working on being able to afford Webb using a coupon.  She currently buys her 70/30 at [facility] due to cost.  She is interested in use of insulin pen but states it has been too expensive in the past.  She is also interested in use of CGM for monitoring to be more consistent.  She admits that "sweets" are her weakness.  We discussed the importance of controlling blood sugars in order for her foot to heal.  Patient shared that she used to be >500 pounds and that she knows what to do with her diet.  We discussed importance of  follow- up with PCP and taking medications as ordered.

## 2021-02-08 NOTE — ED Notes (Signed)
Patient in gown, jewelery and accessories removed; belongings placed in bag to give to spouse.

## 2021-02-08 NOTE — Assessment & Plan Note (Addendum)
Pt meets sepsis criteria from admission follow deep wound as well as anaerobic intraoperative cultures.

## 2021-02-08 NOTE — ED Notes (Signed)
Notified Manuela Schwartz, NP of pt's critical lactic of 8.5

## 2021-02-08 NOTE — H&P (Addendum)
HISTORY AND PHYSICAL INTERVAL NOTE:  02/08/2021  7:12 AM  Nancy [name]  has presented today for surgery, with the diagnosis of L97.423 - Ulcer of left heel and midfoot with necrosis of muscle  L03.116 - Cellulitis of left foot.  The various methods of treatment have been discussed with the patient.  No guarantees were given.  After consideration of risks, benefits and other options for treatment, the patient has consented to surgery.  I have reviewed the patients chart and labs.     A history and physical examination was performed in my office.  The patient was reexamined.  Pt was admitted overnight through ER.  Noted changes to the ulcer with progression of ulcer, infection, necrosis and now pathological heel fracture on xray.  Will plan for debridement of wound including bone excision and cultures with biopsy for attempts at limb salvage. D/w pt changes since initial surgical plan and understands surgical plan today.  [name]

## 2021-02-08 NOTE — Hospital Course (Addendum)
°  56 year old Caucasian female community dwelling Prior cellulitis dating back to 03/2015-group B strep--left second toe osteomyelitis status post amputation 05/2011 Prior R TKR 2013  Patient follows with Dr. Gwyneth Revels D.P.M.--last seen in this office 01/31/2021 and noted to have posterior left heel ulcer, 3X2X 1 cm-was debrided to 3X2X 1.5 cm and deep fibrotic nonviable and necrotic tissue was removed  In addition patient was seen by Dr. Barbara Cower dew on 1/24 with nonhealing ulceration of left heel-ABIs showed 1.1 bilaterally with diminished waveforms on the right--it was felt that patient may have tibial disease and angiogram was contemplated  Patient called the office on 1/26 and was found to have fever 103.3 as well as severe pain-patient was told to present to the emergency room  Underwent full-thickness excisional debridement calcium sulfate bead impregnation Dr. Ether Griffins 02/08/2021 Patient currently getting supportive management-ID consulted with regards to duration antibiotics and have finalized their recommendations Has been seen several times subsequently by podiatry NS at bedside debridement-May benefit from further operative management for left limb salvage as per them

## 2021-02-08 NOTE — Assessment & Plan Note (Addendum)
Diabetic foot infection with peripheral neuropathy with necrotic wound and osteomyelitis of the calcaneum Status postdebridement of the wound and partial calcanectomy Patient is currently on vancomycin cefepime and Flagyl. Enterococcus faecalis and wound culture Awaiting f finalization of the culture Will continue with vancomycin. Will change cefepime and Flagyl to Unasyn.  Will need IV antibiotics on discharge.  She is currently not ready for discharge yet. She is going to need 4 to 6 weeks of IV

## 2021-02-08 NOTE — Assessment & Plan Note (Addendum)
Uncontrolled-A1c 12.4 indicating hyperglycemia predominantly We will add long-acting insulin Semglee/Levemir 15 units today Add back metformin 1500 twice daily-[upper limit of normal]

## 2021-02-08 NOTE — Op Note (Addendum)
Operative note   Surgeon:Mertha Clyatt [employer] forces logistics/support/administrative officer: None    Preop diagnosis: 1.  Full-thickness ulcer left heel to calcaneus 2.  Osteomyelitis left calcaneus 3. Cellulitis left lower leg    Postop diagnosis: 1.  Full-thickness necrotic ulcer down to and including bone 2.  Osteomyelitis left calcaneus 3.  Septic arthritis left ankle    Procedure: 1.  Full-thickness excisional debridement and removal of posterior superior calcaneus left posterior heel 2.  Arthrotomy with I&D of septic ankle left foot 3.  Placement of antibiotic impregnated calcium sulfate beads    EBL: Minimal    Anesthesia:local and general.  Local consisting of a one-to-one mixture of 0.5% plain bupivacaine and 1% lidocaine with epinephrine    Hemostasis: Lidocaine with epinephrine    Specimen: Posterior calcaneus for pathology and bone culture    Complications: None    Operative indications:Nancy [name] is an 56 y.o. that presents today for surgical intervention.  The risks/benefits/alternatives/complications have been discussed and consent has been given.    Procedure:  Patient was brought into the OR and placed on the operating table in thelateral position. After anesthesia was obtained theleft lower extremity was prepped and draped in usual sterile fashion.  Attention was directed to the posterior aspect of the left lower extremity where a large necrotic posterior heel ulcer was noted.  At this time sharp dissection was carried out removing all of the large necrotic tissue down to the level of bone.  There was noted to be a loose fracture of the posterior superior calcaneus that was removed from the surgical field in toto.  This was detached from the residual Achilles tendon.  Large amounts of necrotic and purulent drainage was noted from this wound area.  The Achilles tendon noted to have a sinus tract surrounding an incision was carried out more proximal.  This exposed the Achilles tendon and all of this was  debrided and removed from the surgical field in toto.  The bone was obviously infected as well to the residual superior aspect.  Arthrotomy was performed to the posterior aspect of the ankle and there was no to be purulence from this area as well.  All of the nonviable fibrotic necrotic and infected tissue was excised with a combination of scissors blade and Versajet.  The deep areas were flushed as well as the ankle joint flushed from the posterior aspect.  After final removal and Bovie cauterization of all bleeders the surrounding soft tissue of the posterior aspect of the ankle joint the plantar aspect of the calcaneus and along the Achilles region were packed with antibiotic impregnated vancomycin calcium sulfate beads.  Always in a large bulky sterile dressing was applied.  Of note prior to debridement there was a open ulceration that measured about 3 x 4 cm with a large amount of necrotic surrounding tissue.  Postdebridement the ulcer site measured approximately 8 cm x 6 5 cm with a depth down to the posterior aspect of the ankle of approximately 4 to 5 cm.    Patient tolerated the procedure and anesthesia well.  Was transported from the OR to the PACU with all vital signs stable and vascular status intact. To be discharged per routine protocol.

## 2021-02-08 NOTE — Progress Notes (Signed)
Progress Note   Patient: Nancy [name] TDV:[id number] DOB: [birth year] DOA: 02/07/2021     0 DOS: the patient was seen and examined on 02/08/2021   Brief hospital course:  56 year old Caucasian female community dwelling Prior cellulitis dating back to 03/2015-group B strep--left second toe osteomyelitis status post amputation 05/2011 Prior R TKR 2013  Patient follows with Dr. Gwyneth Revels D.P.M.--last seen in this office 01/31/2021 and noted to have posterior left heel ulcer, 3X2X 1 cm-was debrided to 3X2X 1.5 cm and deep fibrotic nonviable and necrotic tissue was removed  In addition patient was seen by Dr. Barbara Cower dew on 1/24 with nonhealing ulceration of left heel-ABIs showed 1.1 bilaterally with diminished waveforms on the right--it was felt that patient may have tibial disease and angiogram was contemplated  Patient called the office on 1/26 and was found to have fever 103.3 as well as severe pain-patient was told to present to the emergency room   Assessment and Plan: * Diabetic foot ulcer (HCC)- (present on admission) Underwent debridement under the care of Dr. Ether Griffins with full-thickness excisional debridement arthrotomy and I&D septic left ankle antibiotic impregnated CaSo4 beads Defer to Dr. Ether Griffins regarding follow-up with Dr. Wyn Quaker of vascular?  Apparently has a 75-month follow-up appointment with him Pain is controlled at this time on oral meds  Sepsis secondary to diabetic foot infection- (present on admission) Pt meets sepsis criteria from admission follow deep wound as well as anaerobic intraoperative cultures.      Diabetes mellitus, type 2 (HCC) Uncontrolled-A1c 12.4 indicating hyperglycemia predominantly We will add long-acting insulin Semglee/Levemir 15 units today Add back metformin 1500 twice daily-[upper limit of normal]   Hyponatremia- (present on admission) Cont with MIVf at low rate. May need to test for urine electrolytes if goes below 130  predominantly   Tobacco abuse- (present on admission) Nicotine patch.   Essential hypertension- (present on admission) cont Coreg 3.125 twice daily meals, hydralazine 10 every 6 as needed pressure >160  add back lisinopril from prior to admission         Subjective:   Awake coherent no distress seen on PACU and is pretty stable vitals are stable received oxycodone in addition to IV Tylenol and other meds for pain Did not receive a nerve block   Physical Exam: Vitals:   02/08/21 0624 02/08/21 0839 02/08/21 0930 02/08/21 1030  BP: (!) 180/79  137/82 (!) 146/78  Pulse: 78 85 70 77  Resp: 18  18 15   Temp: 97.8 F (36.6 C) (!) 97.5 F (36.4 C)  (!) 97.4 F (36.3 C)  TempSrc: Temporal     SpO2: 100% 100%    Weight: 109.3 kg     Height: 5\' 9"  (1.753 m)      EOMI NCAT no focal deficit no icterus no pallor thick neck Mallampati 4, moderate dentition S1-S2 no murmur no rub no gallop Abdomen soft no rebound no guarding ROM intact moving all 4 limbs equally Left lower extremity is bandaged and Coban dressing she is able to wiggle her toes I did not assess pulses Neurologically intact moving all other limbs without deficit sensory is intact   Data Reviewed:  Patient's sodium is still a little bit low in the 131 range random glucose ranging from 3 67-2 90 Lactic acid is 0.9 CRP is 36, WBC 11.6-->12.1 sed rate 106  Family Communication: Discussed with patient alone  Disposition: Status is: Inpatient Remains inpatient appropriate because: needs BKA?   Planned Discharge Destination:  Unclear at this  time will need vascular surgery to see the patient and follow-up with podiatrist to determine next steps in terms of plan of care44     Time spent: 44 minutes  Author: Nita Sells, MD 02/08/2021 11:15 AM  For on call review [email or web address].

## 2021-02-08 NOTE — Assessment & Plan Note (Signed)
-  Nicotine patch 

## 2021-02-08 NOTE — Progress Notes (Signed)
Initial Nutrition Assessment  DOCUMENTATION CODES:   Obesity unspecified  INTERVENTION:   -Once diet is advanced, add:   -1 packet Juven BID, each packet provides 95 calories, 2.5 grams of protein (collagen), and 9.8 grams of carbohydrate (3 grams sugar); also contains 7 grams of L-arginine and L-glutamine, 300 mg vitamin C, 15 mg vitamin E, 1.2 mcg vitamin B-12, 9.5 mg zinc, 200 mg calcium, and 1.5 g  Calcium Beta-hydroxy-Beta-methylbutyrate to support wound healing  -MVI with minerals daily -Ensure Max po daily, each supplement provides 150 kcal and 30 grams of protein -500 mg vitamin C BID -220 mg zinc sulfate daily   NUTRITION DIAGNOSIS:   Increased nutrient needs related to post-op healing as evidenced by estimated needs.  GOAL:   Patient will meet greater than or equal to 90% of their needs  MONITOR:   PO intake, Supplement acceptance, Diet advancement, Labs, Weight trends, Skin, I & O's  REASON FOR ASSESSMENT:   Consult Wound healing  ASSESSMENT:   Nancy [name] is a 56 y.o. female with medical history significant of DM ii, asthma, cad, DFU. Hyperlipidemia presenting with DFU on the left ankle patient is scheduled to have surgery with podiatrist tomorrow.  Pain with weightbearing and 10 out of 10 severe nonradiating.  Podiatrist referred patient to the hospital.  Pt admitted with lt heel diabetic foot ulcer.   2/2- s/p 1.  Full-thickness excisional debridement and removal of posterior superior calcaneus left posterior heel 2.  Arthrotomy with I&D of septic ankle left foot 3.  Placement of antibiotic impregnated calcium sulfate beads  Reviewed I/O's: +3.1 L x 24 hours  Pt unavailable at time of visit. Pt down in OR at time of visit. RD unable to obtain further nutrition-related history or complete nutrition-focused physical exam at this time.     Pt currently NPO for procedure. Per H&P, pt had wound vac PTA and was followed by podiatry for wound care.    Reviewed wt hx; no wt loss over over the past 18 months.   Pt would greatly benefit from addition of oral nutrition supplements to promote wound healing.   Medications reviewed and include colace, zofran, florastor, and 0.9% sodium chloride infusion @ 10 ml/hr.  Lab Results  Component Value Date   HGBA1C 12.4 (H) 02/06/2021   PTA DM medications are 1500 mg metformin TID and 5 mg mounjaro weekly.   Labs reviewed: Na: 131, CBGS: 248 (inpatient orders for glycemic control are 0-6 units insulin aspart TID).    Diet Order:   Diet Order             Diet NPO time specified  Diet effective midnight           Diet heart healthy/carb modified Room service appropriate? Yes; Fluid consistency: Thin  Diet effective now                   EDUCATION NEEDS:   No education needs have been identified at this time  Skin:  Skin Assessment: Skin Integrity Issues: Skin Integrity Issues:: Diabetic Ulcer Diabetic Ulcer: lt heel  Last BM:  Unknown  Height:   Ht Readings from Last 1 Encounters:  02/08/21 5\' 9"  (1.753 m)    Weight:   Wt Readings from Last 1 Encounters:  02/08/21 109.3 kg    Ideal Body Weight:  65.9 kg  BMI:  Body mass index is 35.59 kg/m.  Estimated Nutritional Needs:   Kcal:  2100-2300  Protein:  115-130 grams  Fluid:  > 2.1 L    Levada Schilling, RD, LDN, CDCES Registered Dietitian II Certified Diabetes Care and Education Specialist Please refer to [facility] for RD and/or RD on-call/weekend/after hours pager

## 2021-02-08 NOTE — Assessment & Plan Note (Addendum)
Cont with MIVf at low rate. May need to test for urine electrolytes if goes below 130 predominantly

## 2021-02-08 NOTE — Sepsis Progress Note (Signed)
Sepsis monitoring complete. Appears the lactic of 8.5 was an erroneous value.

## 2021-02-08 NOTE — Anesthesia Postprocedure Evaluation (Signed)
Anesthesia Post Note  Patient: Nancy [name]  Procedure(s) Performed: IRRIGATION AND DEBRIDEMENT FOOT - LFT HEEL ULCER (Left: Heel)  Patient location during evaluation: PACU Anesthesia Type: General Level of consciousness: awake and alert, awake and oriented Pain management: pain level controlled Vital Signs Assessment: post-procedure vital signs reviewed and stable Respiratory status: spontaneous breathing, nonlabored ventilation and respiratory function stable Cardiovascular status: blood pressure returned to baseline and stable Postop Assessment: no apparent nausea or vomiting Anesthetic complications: no   No notable events documented.   Last Vitals:  Vitals:   02/08/21 0839 02/08/21 0930  BP:  137/82  Pulse: 85 70  Resp:  18  Temp: (!) 36.4 C   SpO2: 100%     Last Pain:  Vitals:   02/08/21 0930  TempSrc:   PainSc: 6                  Phill Mutter

## 2021-02-08 NOTE — Progress Notes (Signed)
CODE SEPSIS - PHARMACY COMMUNICATION  **Broad Spectrum Antibiotics should be administered within 1 hour of Sepsis diagnosis**  Time Code Sepsis Called/Page Received: 2134  Antibiotics Ordered: cefepime,vanc  Time of 1st antibiotic administration:  Cefepime 2 gm IV X 1 on 2/1 @ 2321  Additional action taken by pharmacy: messaged RN then called RN at 2230.  RN going in to hang abx  If necessary, Name of Provider/Nurse Contacted: N.Zohbi    Orene Desanctis ,PharmD Clinical Pharmacist  02/08/2021  12:47 AM

## 2021-02-08 NOTE — Assessment & Plan Note (Addendum)
cont Coreg 3.125 twice daily meals, hydralazine 10 every 6 as needed pressure >160  add back lisinopril from prior to admission

## 2021-02-08 NOTE — Anesthesia Preprocedure Evaluation (Addendum)
Anesthesia Evaluation  Patient identified by MRN, date of birth, ID band Patient awake    Reviewed: Allergy & Precautions, NPO status , Patient's Chart, lab work & pertinent test results, reviewed documented beta blocker date and time   History of Anesthesia Complications (+) PONV, Family history of anesthesia reaction and history of anesthetic complications  Airway Mallampati: II  TM Distance: >3 FB Neck ROM: Full    Dental no notable dental hx.    Pulmonary asthma , pneumonia, resolved, former smoker,    Pulmonary exam normal        Cardiovascular hypertension, Pt. on medications and Pt. on home beta blockers + CAD, + Peripheral Vascular Disease and +CHF  Normal cardiovascular exam     Neuro/Psych  Headaches, PSYCHIATRIC DISORDERS Anxiety Depression  Neuromuscular disease    GI/Hepatic Neg liver ROS, GERD  Medicated,  Endo/Other  negative endocrine ROSdiabetes  Renal/GU negative Renal ROS  negative genitourinary   Musculoskeletal  (+) Arthritis , Osteoarthritis,    Abdominal   Peds negative pediatric ROS (+)  Hematology negative hematology ROS (+)   Anesthesia Other Findings Anxiety    Arthritis  r knee  Asthma    CAD (coronary artery disease)  a.) LHC 06/04/2009 at [facility]; non-obstructive CAD. b.) CTA with FFR 10/08/2018: extensive mixed plaque proximal to mid LAD (51-69%); Coronary Ca score 217; FFR 0.71 dPDA, 0.86 mLAD, 0.87 dLCx.  CCF (congestive cardiac failure) ([place]) 06/06/2008 a.) 30% EF. b.) TTE 06/03/2011: EF >55%; triv MR. c.) TTE 04/27/2018: EF 55%; mild LVH; triv PR, mild MR/TR; G1DD.  Complication of anesthesia    Eczema    Family history of adverse reaction to anesthesia GERD (gastroesophageal reflux disease)  History of kidney stones    HLD (hyperlipidemia)    Migraines    Motion sickness    Panic attacks    Pneumonia    PONV (postoperative nausea and vomiting)   T2DM (type 2 diabetes  mellitus) ([place])   Takotsubo cardiomyopathy / transient apical balooning syndrome / stress-induced cardiomyopathy 05/10/2014 Unspecified essential hypertension       Reproductive/Obstetrics negative OB ROS                            Anesthesia Physical Anesthesia Plan  ASA: 3  Anesthesia Plan: General   Post-op Pain Management:    Induction: Intravenous  PONV Risk Score and Plan: 4 or greater and Propofol infusion, Ondansetron and Midazolam  Airway Management Planned: Natural Airway and Nasal Cannula  Additional Equipment:   Intra-op Plan:   Post-operative Plan: Extubation in OR  Informed Consent: I have reviewed the patients History and Physical, chart, labs and discussed the procedure including the risks, benefits and alternatives for the proposed anesthesia with the patient or authorized representative who has indicated his/her understanding and acceptance.       Plan Discussed with: CRNA, Anesthesiologist and Surgeon  Anesthesia Plan Comments:        Anesthesia Quick Evaluation

## 2021-02-08 NOTE — Progress Notes (Signed)
Notified patient spouse that we are waiting for a room assignment, and that the patient is good and stable. Patient also communicated with spouse on the phone.

## 2021-02-09 DIAGNOSIS — L089 Local infection of the skin and subcutaneous tissue, unspecified: Secondary | ICD-10-CM

## 2021-02-09 DIAGNOSIS — E11628 Type 2 diabetes mellitus with other skin complications: Secondary | ICD-10-CM

## 2021-02-09 DIAGNOSIS — M86172 Other acute osteomyelitis, left ankle and foot: Secondary | ICD-10-CM | POA: Diagnosis not present

## 2021-02-09 DIAGNOSIS — E1142 Type 2 diabetes mellitus with diabetic polyneuropathy: Secondary | ICD-10-CM | POA: Diagnosis not present

## 2021-02-09 DIAGNOSIS — I2510 Atherosclerotic heart disease of native coronary artery without angina pectoris: Secondary | ICD-10-CM

## 2021-02-09 DIAGNOSIS — I509 Heart failure, unspecified: Secondary | ICD-10-CM

## 2021-02-09 DIAGNOSIS — L97429 Non-pressure chronic ulcer of left heel and midfoot with unspecified severity: Secondary | ICD-10-CM | POA: Diagnosis not present

## 2021-02-09 DIAGNOSIS — E11621 Type 2 diabetes mellitus with foot ulcer: Secondary | ICD-10-CM | POA: Diagnosis not present

## 2021-02-09 DIAGNOSIS — E785 Hyperlipidemia, unspecified: Secondary | ICD-10-CM

## 2021-02-09 LAB — COMPREHENSIVE METABOLIC PANEL
ALT: 11 U/L (ref 0–44)
AST: 13 U/L — ABNORMAL LOW (ref 15–41)
Albumin: 1.9 g/dL — ABNORMAL LOW (ref 3.5–5.0)
Alkaline Phosphatase: 71 U/L (ref 38–126)
Anion gap: 9 (ref 5–15)
BUN: 16 mg/dL (ref 6–20)
CO2: 26 mmol/L (ref 22–32)
Calcium: 8.4 mg/dL — ABNORMAL LOW (ref 8.9–10.3)
Chloride: 98 mmol/L (ref 98–111)
Creatinine, Ser: 0.6 mg/dL (ref 0.44–1.00)
GFR, Estimated: >60 mL/min (ref >60)
Glucose, Bld: 240 mg/dL — ABNORMAL HIGH (ref 70–99)
Potassium: 3.7 mmol/L (ref 3.5–5.1)
Sodium: 133 mmol/L — ABNORMAL LOW (ref 135–145)
Total Bilirubin: 0.5 mg/dL (ref 0.3–1.2)
Total Protein: 6 g/dL — ABNORMAL LOW (ref 6.5–8.1)

## 2021-02-09 LAB — URINE CULTURE: Culture: NO GROWTH

## 2021-02-09 LAB — CBC WITH DIFFERENTIAL/PLATELET
Abs Immature Granulocytes: 0.09 10*3/uL — ABNORMAL HIGH (ref 0.00–0.07)
Basophils Absolute: 0.1 10*3/uL (ref 0.0–0.1)
Basophils Relative: 1 %
Eosinophils Absolute: 0.4 10*3/uL (ref 0.0–0.5)
Eosinophils Relative: 4 %
HCT: 33.9 % — ABNORMAL LOW (ref 36.0–46.0)
Hemoglobin: 10.9 g/dL — ABNORMAL LOW (ref 12.0–15.0)
Immature Granulocytes: 1 %
Lymphocytes Relative: 20 %
Lymphs Abs: 1.8 10*3/uL (ref 0.7–4.0)
MCH: 29.3 pg (ref 26.0–34.0)
MCHC: 32.2 g/dL (ref 30.0–36.0)
MCV: 91.1 fL (ref 80.0–100.0)
Monocytes Absolute: 0.9 10*3/uL (ref 0.1–1.0)
Monocytes Relative: 10 %
Neutro Abs: 5.5 10*3/uL (ref 1.7–7.7)
Neutrophils Relative %: 64 %
Platelets: 420 10*3/uL — ABNORMAL HIGH (ref 150–400)
RBC: 3.72 MIL/uL — ABNORMAL LOW (ref 3.87–5.11)
RDW: 12.9 % (ref 11.5–15.5)
WBC: 8.8 10*3/uL (ref 4.0–10.5)
nRBC: 0 % (ref 0.0–0.2)

## 2021-02-09 LAB — HEMOGLOBIN A1C
Hgb A1c MFr Bld: 12.9 % — ABNORMAL HIGH (ref 4.8–5.6)
Mean Plasma Glucose: 324 mg/dL

## 2021-02-09 LAB — SURGICAL PATHOLOGY

## 2021-02-09 LAB — GLUCOSE, CAPILLARY
Glucose-Capillary: 247 mg/dL — ABNORMAL HIGH (ref 70–99)
Glucose-Capillary: 245 mg/dL — ABNORMAL HIGH (ref 70–99)
Glucose-Capillary: 195 mg/dL — ABNORMAL HIGH (ref 70–99)
Glucose-Capillary: 238 mg/dL — ABNORMAL HIGH (ref 70–99)

## 2021-02-09 MED ORDER — HYDROMORPHONE HCL 2 MG PO TABS
1.0000 mg | ORAL_TABLET | ORAL | Status: DC | PRN
  Administered 2021-02-09 – 2021-02-10 (×2): 1 mg via ORAL
  Filled 2021-02-09 (×3): qty 1

## 2021-02-09 MED ORDER — INSULIN GLARGINE-YFGN 100 UNIT/ML ~~LOC~~ SOLN
10.0000 [IU] | Freq: Every day | SUBCUTANEOUS | Status: DC
  Administered 2021-02-09 – 2021-02-10 (×2): 10 [IU] via SUBCUTANEOUS
  Filled 2021-02-09 (×3): qty 0.1

## 2021-02-09 MED ORDER — TRAMADOL HCL 50 MG PO TABS
50.0000 mg | ORAL_TABLET | Freq: Four times a day (QID) | ORAL | Status: DC | PRN
  Administered 2021-02-09: 20:00:00 50 mg via ORAL
  Filled 2021-02-09: qty 1

## 2021-02-09 MED ORDER — SODIUM CHLORIDE 0.9 % IV SOLN
3.0000 g | Freq: Four times a day (QID) | INTRAVENOUS | Status: DC
  Administered 2021-02-09 – 2021-02-18 (×34): 3 g via INTRAVENOUS
  Filled 2021-02-09 (×7): qty 8
  Filled 2021-02-09: qty 3
  Filled 2021-02-09 (×2): qty 8
  Filled 2021-02-09: qty 3
  Filled 2021-02-09 (×2): qty 8
  Filled 2021-02-09 (×2): qty 3
  Filled 2021-02-09 (×4): qty 8
  Filled 2021-02-09: qty 3
  Filled 2021-02-09 (×4): qty 8
  Filled 2021-02-09: qty 3
  Filled 2021-02-09: qty 8
  Filled 2021-02-09: qty 3
  Filled 2021-02-09 (×2): qty 8
  Filled 2021-02-09: qty 3
  Filled 2021-02-09 (×3): qty 8
  Filled 2021-02-09 (×2): qty 3
  Filled 2021-02-09 (×3): qty 8

## 2021-02-09 NOTE — Progress Notes (Signed)
Daily Progress Note   Subjective  - 1 Day Post-Op  F/u left heel debridement.  Little nausea this morning but she believes secondary to the Norco.  She states prior to taking the Norco she actually felt pretty good.  Appetite is good.  She states she did spike a fever last night of 102.  Objective Vitals:   02/08/21 2042 02/08/21 2142 02/09/21 0111 02/09/21 0551  BP: (!) 158/65  117/70 114/64  Pulse: 85  79 73  Resp: 20  18 20   Temp: (!) 102.6 F (39.2 C) (!) 101.4 F (38.6 C) 99.7 F (37.6 C) 98 F (36.7 C)  TempSrc: Oral Oral Oral Oral  SpO2: 99%  97% 99%  Weight:      Height:        Physical Exam: At this point the posterior heel is stable.  No further purulence at this time.  The bones exposed to the entire posterior superior aspect of the calcaneus.  Some fibrotic tissue residual.  Predebridement   Postdebridement    Laboratory CBC    Component Value Date/Time   WBC 11.0 (H) 02/08/2021 1411   HGB 11.5 (L) 02/08/2021 1411   HGB 14.8 11/04/2016 1109   HCT 35.5 (L) 02/08/2021 1411   HCT 46.1 11/04/2016 1109   PLT 469 (H) 02/08/2021 1411   PLT 265 11/04/2016 1109    BMET    Component Value Date/Time   NA 130 (L) 02/08/2021 1411   NA 134 11/04/2016 1109   NA 133 (L) 08/21/2012 1523   K 4.0 02/08/2021 1411   K 4.1 08/21/2012 1523   CL 98 02/08/2021 1411   CL 102 08/21/2012 1523   CO2 24 02/08/2021 1411   CO2 26 08/21/2012 1523   GLUCOSE 245 (H) 02/08/2021 1411   GLUCOSE 306 (H) 08/21/2012 1523   BUN 15 02/08/2021 1411   BUN 16 11/04/2016 1109   BUN 17 08/21/2012 1523   CREATININE 0.66 02/08/2021 1411   CREATININE 0.69 08/21/2012 1523   CALCIUM 8.2 (L) 02/08/2021 1411   CALCIUM 8.8 08/21/2012 1523   GFRNONAA >60 02/08/2021 1411   GFRNONAA >60 08/21/2012 1523   GFRAA 117 11/04/2016 1109   GFRAA >60 08/21/2012 1523    Assessment/Planning: Status post debridement of posterior heel decubitus ulcer with pathological calcaneal fracture and septic  ankle arthritis  Diabetes with neuropathy  At this point the foot is stable postoperative day #1.  We will have to continue to monitor for improvement or regression of the wound at this time.  There is a large amount of bone exposed to the posterior calcaneus at this time. We have discussed amputation but at this point she would like to try any and all options prior to amputation.  I believe the only attempts at limb salvage would be attempt to granulation getting the infection under control and possible flap to the posterior aspect of the heel via plastic surgery. I discussed this would likely take months having attempts at healing. Fortunately today her foot does have improvement as the edema and erythema around the ankle and posterior heel have improved from yesterday. Patient will need infectious disease consultation for IV antibiotics. We will reach out to plastic surgery if they are willing to evaluate for possible flap in the future.  08/23/2012 A  02/09/2021, 7:32 AM

## 2021-02-09 NOTE — TOC Initial Note (Signed)
Transition of Care [facility]) - Initial/Assessment Note    Patient Details  Name: Nancy [name] MRN: [id number] Date of Birth: 1965/07/04  Transition of [facility]) CM/SW Contact:    Marlowe Sax, RN Phone Number: 02/09/2021, 9:07 AM . Clinical Narrative:                The patient had a debridement done by podiatry,   Patient will need infectious disease consultation for IV antibiotics TOC to follow for needs, Will likely need Long term IV ABX at home, possibly SNF       Patient Goals and CMS Choice        Expected Discharge Plan and Services                                                Prior Living Arrangements/Services                       Activities of Daily Living Home Assistive Devices/Equipment: None ADL Screening (condition at time of admission) Patient's cognitive ability adequate to safely complete daily activities?: Yes Is the patient deaf or have difficulty hearing?: No Does the patient have difficulty seeing, even when wearing glasses/contacts?: No (reading glasses) Does the patient have difficulty concentrating, remembering, or making decisions?: No Patient able to express need for assistance with ADLs?: Yes Does the patient have difficulty dressing or bathing?: No Independently performs ADLs?: Yes (appropriate for developmental age) Does the patient have difficulty walking or climbing stairs?: Yes (pt states  she had trouble climbing stairs) Weakness of Legs: Left Weakness of Arms/Hands: None  Permission Sought/Granted                  Emotional Assessment              Admission diagnosis:  Diabetic foot ulcer (HCC) [E83.151, L97.509] Sepsis due to cellulitis (HCC) [L03.90, A41.9] Diabetic ulcer of left heel associated with type 2 diabetes mellitus, with bone involvement without evidence of necrosis (HCC) [V61.607, L97.426] Patient Active Problem List   Diagnosis Date Noted   Diabetic foot ulcer (HCC)  02/08/2021   Atherosclerosis of native arteries of the extremities with ulceration (HCC) 01/30/2021   Well woman exam with routine gynecological exam 10/05/2015   Diabetic foot infection (HCC) 03/13/2015   Diabetes mellitus, type 2 (HCC) 03/13/2015   Sepsis secondary to diabetic foot infection 03/13/2015   Tobacco abuse 03/13/2015   Hyponatremia 03/13/2015   Allergic rhinitis 05/10/2014   Asthma without status asthmaticus 05/10/2014   Atherosclerosis of coronary artery 05/10/2014   Breast cyst 05/10/2014   Clinical depression 05/10/2014   Neuropathy, diabetic (HCC) 05/10/2014   Acid reflux 05/10/2014   Combined fat and carbohydrate induced hyperlipemia 05/10/2014   BP (high blood pressure) 05/10/2014   Headache, migraine 05/10/2014   Extreme obesity 05/10/2014   Disorder of peripheral nervous system 05/10/2014   Apical ballooning syndrome 05/10/2014   Adverse effect of motion 05/10/2014   Essential hypertension 08/09/2008   CCF (congestive cardiac failure) (HCC) 06/06/2008   PCP:  Miki Kins, FNP Pharmacy:   [facility] [address] [place] - [address] [place] [address] Phone: [phone] Fax: [phone]  [facility] [place], [place] - [address] [place] [address] Phone: [phone]  Fax: [phone]  [id number] [place], [place] - [address] [place] [address] Phone: [phone] Fax: [phone]     Social Determinants of Health (SDOH) Interventions    Readmission Risk Interventions No flowsheet data found.

## 2021-02-09 NOTE — Evaluation (Signed)
Occupational Therapy Evaluation Patient Details Name: Nancy [name] MRN: [id number] DOB: March 01, 1965 Today's Date: 02/09/2021   History of Present Illness Nancy [name] is a 56 y.o. female admitted on 02/07/2021 with sepsis and diabetic foot ulcer. Patient underwent full-thickness excisional debridement and removal of posterior superior calcaneus left posterior heel on 02/08/21.   Clinical Impression   [name] was seen for OT evaluation this date. Prior to hospital admission, pt was MOD I for mobility using knee scooter as needed. Pt lives with wife in home c 2 STE however plans to d/c to mothers handicap accessible apartment. Pt currently MOD I bed mobility and MOD I don/doff R sock seated EOB. SUPERVISION + RW toilet t/f and clothing mgmt in standing - MIN cues to maintain NWBing pcns as pt places toes on ground for stability. Pt reports increased comfort using knee scooter - trialed with good balance and safety awareness. Educated pt and spouse on functional application of NWBing pcns and home/routines modifications. All education complete, no acute OT needs identified. Will sign off, no follow up OT recommended.      Recommendations for follow up therapy are one component of a multi-disciplinary discharge planning process, led by the attending physician.  Recommendations may be updated based on patient status, additional functional criteria and insurance authorization.   Follow Up Recommendations  No OT follow up    Assistance Recommended at Discharge Intermittent Supervision/Assistance  Patient can return home with the following A little help with walking and/or transfers;A little help with bathing/dressing/bathroom;Help with stairs or ramp for entrance    Functional Status Assessment  Patient has had a recent decline in their functional status and demonstrates the ability to make significant improvements in function in a reasonable and predictable amount of time.  Equipment  Recommendations  None recommended by OT (has all needed equipment)    Recommendations for Other Services       Precautions / Restrictions Precautions Precautions: Fall Restrictions Weight Bearing Restrictions: Yes LLE Weight Bearing: Non weight bearing      Mobility Bed Mobility Overal bed mobility: Modified Independent                  Transfers Overall transfer level: Needs assistance Equipment used: Rolling walker (2 wheels) (knee scooter) Transfers: Sit to/from Stand Sit to Stand: Min guard           General transfer comment: cues for safety      Balance Overall balance assessment: Needs assistance Sitting-balance support: No upper extremity supported, Feet supported Sitting balance-Leahy Scale: Normal     Standing balance support: During functional activity, Single extremity supported Standing balance-Leahy Scale: Fair                             ADL either performed or assessed with clinical judgement   ADL Overall ADL's : Needs assistance/impaired                                       General ADL Comments: MOD I don/doff R sock seated EOB. SUPERVISION + RW toilet t/f and clothing mgmt in standing - MIN cues to maintain NWBing pcns as pt places toes on ground for stability      Pertinent Vitals/Pain Pain Assessment Pain Assessment: 0-10 Pain Score: 3  Pain Location: L heel Pain Descriptors / Indicators: Discomfort, Dull Pain  Intervention(s): Limited activity within patient's tolerance, Premedicated before session     Hand Dominance     Extremity/Trunk Assessment Upper Extremity Assessment Upper Extremity Assessment: Overall WFL for tasks assessed   Lower Extremity Assessment Lower Extremity Assessment: Generalized weakness       Communication Communication Communication: No difficulties   Cognition Arousal/Alertness: Awake/alert Behavior During Therapy: WFL for tasks assessed/performed Overall  Cognitive Status: Within Functional Limits for tasks assessed                                                  Home Living Family/patient expects to be discharged to:: Private residence Living Arrangements: Spouse/significant other Available Help at Discharge: Family;Available 24 hours/day Type of Home: Apartment Home Access: Level entry     Home Layout: One level     Bathroom Shower/Tub: Producer, television/film/video: Handicapped height     Home Equipment: Agricultural consultant (2 wheels);Shower seat;Other (comment) (knee scooter)   Additional Comments: home setup for mothers house - plan to d/c there for handicap accessiblity and 24/7 supervision      Prior Functioning/Environment Prior Level of Function : Independent/Modified Independent                        OT Problem List: Decreased activity tolerance         OT Goals(Current goals can be found in the care plan section) Acute Rehab OT Goals Patient Stated Goal: to go home OT Goal Formulation: With patient/family Time For Goal Achievement: 02/23/21 Potential to Achieve Goals: Good   AM-PAC OT "6 Clicks" Daily Activity     Outcome Measure Help from another person eating meals?: None Help from another person taking care of personal grooming?: A Little Help from another person toileting, which includes using toliet, bedpan, or urinal?: A Little Help from another person bathing (including washing, rinsing, drying)?: A Little Help from another person to put on and taking off regular upper body clothing?: None Help from another person to put on and taking off regular lower body clothing?: A Little 6 Click Score: 20   End of Session Equipment Utilized During Treatment: Rolling walker (2 wheels) Nurse Communication: Mobility status  Activity Tolerance: Patient tolerated treatment well Patient left: in bed;with call bell/phone within reach;with nursing/sitter in room;with family/visitor  present  OT Visit Diagnosis: Unsteadiness on feet (R26.81);Other abnormalities of gait and mobility (R26.89)                Time: 7169-6789 OT Time Calculation (min): 20 min Charges:  OT General Charges $OT Visit: 1 Visit OT Evaluation $OT Eval Low Complexity: 1 Low OT Treatments $Self Care/Home Management : 8-22 mins  Kathie Dike, M.S. OTR/L  02/09/21, 4:27 PM  ascom [phone]

## 2021-02-09 NOTE — Consult Note (Signed)
NAME: Nancy [name]  DOB: [birth year]  MRN: [id number]  Date/Time: 02/09/2021 7:39 PM  REQUESTING PROVIDER: Dr. [name] Subjective:  REASON FOR CONSULT: Left foot infection ? Nancy [name] is a 56 y.o. female with a history of diabetes mellitus, CAD, CHF, hyperlipidemia this presenting to the hospital for an infected left foot wound Patient developed a small blister on the left heel and Achilles area many months ago.  She was followed by podiatrist and was having some local wound care.  The progression of the wound was intermittent with good days and bad periods.  Recently when she saw Dr. Vickki Muff on 01/31/2021 he had debrided the wound and put her on doxycycline.  He was scheduled to go for surgery this week.  But on 02/01/2021 patient had called his office with a fever of 103.3. Patient says within 24 hours the wound changed and became more necrotic with discharge painful and hence she came to the ED on 02/07/2021. Chills in the ED BP 147/76, temperature 102.6, pulse 94, sats 95%.  WBC 12.1, hemoglobin 11.9, platelet 430 and creatinine 0.83.  Blood culture was sent and she was started on vancomycin and cefepime and Flagyl.  She was taken for surgery on 02/08/2021 and underwent full-thickness excisional debridement and removal of posterior superior calcaneus of the left posterior heel.  Arthrotomy with I&D of the septic ankle left foot and placement of antibiotic  beads. Cultures were sent. The preliminary culture was Enterococcus faecalis I am asked to see the patient for antibiotic management  Past Medical History:  Diagnosis Date   Anxiety    Arthritis    r knee   Asthma    CAD (coronary artery disease)    a.) LHC 06/04/2009 at [facility]; non-obstructive CAD. b.) CTA with FFR 10/08/2018: extensive mixed plaque proximal to mid LAD (51-69%); Coronary Ca score 217; FFR 0.71 dPDA, 0.86 mLAD, 0.87 dLCx.   CCF (congestive cardiac failure) ([place]) 06/06/2008   a.) 30% EF. b.) TTE 06/03/2011: EF >55%;  triv MR. c.) TTE 04/27/2018: EF 55%; mild LVH; triv PR, mild MR/TR; G1DD.   Complication of anesthesia    Eczema    Family history of adverse reaction to anesthesia    a.) PONV in mother and grandmother   GERD (gastroesophageal reflux disease) 05/10/2014   History of kidney stones    HLD (hyperlipidemia)    Migraines    Motion sickness    Panic attacks    Pneumonia    PONV (postoperative nausea and vomiting)    T2DM (type 2 diabetes mellitus) ([place])    Takotsubo cardiomyopathy / transient apical balooning syndrome / stress-induced cardiomyopathy 05/10/2014   Unspecified essential hypertension     Past Surgical History:  Procedure Laterality Date   AMPUTATION TOE Left 03/16/2015   Procedure: left fifth toe amputation with incision and drainage;  Surgeon: Samara Deist, DPM;  Location: [facility] ORS;  Service: Podiatry;  Laterality: Left;   CARDIAC CATHETERIZATION Left 06/04/2009   Procedure: CARDIAC CATHETERIZATION; Location: [place] FOOT Left 02/08/2021   Procedure: IRRIGATION AND DEBRIDEMENT FOOT - LFT HEEL ULCER;  Surgeon: Samara Deist, DPM;  Location: [facility] OR[facility];  Service: Podiatry;  Laterality: Left;   kidney stone removal     KNEE ARTHROSCOPY W/ MENISCAL REPAIR     NOSE SURGERY  01/07/1989   due to fracture   TOE AMPUTATION     second toe   TONSILLECTOMY     TOTAL  KNEE ARTHROPLASTY  01/11/2011   Procedure: TOTAL KNEE ARTHROPLASTY;  Surgeon: Alta Corning;  Location: [place];  Service: Orthopedics;  Laterality: Right;  COMPUTER ASSISTED TOTAL KNEE REPLACEMENT    Social History   Socioeconomic History   Marital status: Married    Spouse name: Not on file   Number of children: Not on file   Years of education: Not on file   Highest education level: Not on file  Occupational History   Not on file  Tobacco Use   Smoking status: Former    Packs/day: 0.00    Years: 30.00    Pack years: 0.00    Types: Cigarettes     Quit date: 03/13/2015    Years since quitting: 5.9   Smokeless tobacco: Never  Vaping Use   Vaping Use: Never used  Substance and Sexual Activity   Alcohol use: No   Drug use: No   Sexual activity: Yes    Birth control/protection: Post-menopausal  Other Topics Concern   Not on file  Social History Narrative   Full time. Does not regularly exercise. Domestic partner.    Social Determinants of Health   Financial Resource Strain: Not on file  Food Insecurity: Not on file  Transportation Needs: Not on file  Physical Activity: Not on file  Stress: Not on file  Social Connections: Not on file  Intimate Partner Violence: Not on file    Family History  Problem Relation Age of Onset   Transient ischemic attack Mother    Asthma Mother    Hypertension Mother    Lung cancer Father    Hypertension Father    Asthma Sister    Anxiety disorder Sister    Stomach cancer Maternal Grandmother    Colon cancer Maternal Grandmother    Leukemia Maternal Grandmother    Melanoma Maternal Grandfather    Asthma Sister    Hypertension Brother    Cancer Other    Crohn's disease Other    GER disease Other    Breast cancer Neg Hx    Allergies  Allergen Reactions   Shellfish Allergy Anaphylaxis   Codeine Other (See Comments)    Migraine   Morphine And Related Other (See Comments)    Reaction:  Severe migraines    Percocet [Oxycodone-Acetaminophen] Nausea And Vomiting   Rosuvastatin     Other reaction(s): Muscle Pain   Penicillins Rash and Other (See Comments)    Has patient had a PCN reaction causing immediate rash, facial/tongue/throat swelling, SOB or lightheadedness with hypotension: No Has patient had a PCN reaction causing severe rash involving mucus membranes or skin necrosis: No Has patient had a PCN reaction that required hospitalization No Has patient had a PCN reaction occurring within the last 10 years: No If all of the above answers are "NO", then may proceed with  Cephalosporin use.   Sulfa Antibiotics Rash   Theophyllines Itching and Rash   I? Current Facility-Administered Medications  Medication Dose Route Frequency Provider Last Rate Last Admin   0.9 %  sodium chloride infusion   Intravenous PRN Para Skeans, MD 10 mL/hr at 02/09/21 0436 Infusion Verify at 02/09/21 0436   acetaminophen (TYLENOL) tablet 650 mg  650 mg Oral Q6H PRN Samara Deist, DPM       Or   acetaminophen (TYLENOL) suppository 650 mg  650 mg Rectal Q6H PRN Samara Deist, DPM       acidophilus (RISAQUAD) capsule 1 capsule  1 capsule Oral Daily Samara Deist, DPM  1 capsule at 02/09/21 0826   albuterol (PROVENTIL) (2.5 MG/3ML) 0.083% nebulizer solution 2.5 mg  2.5 mg Inhalation Q4H PRN Samara Deist, DPM       Ampicillin-Sulbactam (UNASYN) 3 g in sodium chloride 0.9 % 100 mL IVPB  3 g Intravenous Q6H Nayanna Seaborn, Joellyn Quails, MD 200 mL/hr at 02/09/21 1536 3 g at 02/09/21 1536   ascorbic acid (VITAMIN C) tablet 500 mg  500 mg Oral BID Nita Sells, MD   500 mg at 02/09/21 0827   bisacodyl (DULCOLAX) EC tablet 5 mg  5 mg Oral Daily PRN Samara Deist, DPM       carvedilol (COREG) tablet 3.125 mg  3.125 mg Oral BID WC Samara Deist, DPM   3.125 mg at 02/09/21 1806   docusate sodium (COLACE) capsule 100 mg  100 mg Oral BID Samara Deist, DPM   100 mg at 02/09/21 0827   enoxaparin (LOVENOX) injection 55 mg  0.5 mg/kg Subcutaneous Q24H Para Skeans, MD   55 mg at 02/09/21 1359   fenofibrate tablet 54 mg  54 mg Oral Daily Samara Deist, DPM   54 mg at 02/09/21 [id number]   hydrALAZINE (APRESOLINE) injection 10 mg  10 mg Intravenous Q6H PRN Samara Deist, DPM       HYDROmorphone (DILAUDID) tablet 1 mg  1 mg Oral Q3H PRN Nita Sells, MD   1 mg at 02/09/21 1355   insulin aspart (novoLOG) injection 0-6 Units  0-6 Units Subcutaneous TID WC Samara Deist, DPM   1 Units at 02/09/21 1700   insulin glargine-yfgn (SEMGLEE) injection 10 Units  10 Units Subcutaneous Daily Nita Sells, MD   10 Units at 02/09/21 1540   lisinopril (ZESTRIL) tablet 5 mg  5 mg Oral BID Nita Sells, MD   5 mg at 02/09/21 0827   metFORMIN (GLUCOPHAGE) tablet 1,000 mg  1,000 mg Oral Q supper Nita Sells, MD   1,000 mg at 02/09/21 1805   metFORMIN (GLUCOPHAGE) tablet 1,500 mg  1,500 mg Oral Q breakfast Florina Ou V, MD   1,500 mg at 02/09/21 0826   montelukast (SINGULAIR) tablet 10 mg  10 mg Oral QHS Samara Deist, DPM   10 mg at 02/08/21 2049   morphine (PF) 2 MG/ML injection 2 mg  2 mg Intravenous Q2H PRN Samara Deist, DPM   2 mg at 02/09/21 1537   multivitamin with minerals tablet 1 tablet  1 tablet Oral Daily Nita Sells, MD   1 tablet at 02/09/21 [id number]   nicotine (NICODERM CQ - dosed in mg/24 hours) patch 14 mg  14 mg Transdermal Daily Samara Deist, DPM       nutrition supplement (JUVEN) (JUVEN) powder packet 1 packet  1 packet Oral BID BM Nita Sells, MD   1 packet at 02/09/21 1612   ondansetron (ZOFRAN) injection 4 mg  4 mg Intravenous Once Samara Deist, DPM       ondansetron [facility]) tablet 4 mg  4 mg Oral Q6H PRN Samara Deist, DPM       Or   ondansetron [facility]) injection 4 mg  4 mg Intravenous Q6H PRN Samara Deist, DPM   4 mg at 02/09/21 0813   pantoprazole (PROTONIX) EC tablet 40 mg  40 mg Oral Daily Samara Deist, DPM   40 mg at 02/09/21 0828   polyethylene glycol (MIRALAX / GLYCOLAX) packet 17 g  17 g Oral Daily PRN Samara Deist, DPM       pravastatin (PRAVACHOL) tablet 40 mg  40 mg Oral QHS  Samara Deist, DPM   40 mg at 02/08/21 2049   protein supplement (ENSURE MAX) liquid  11 oz Oral QHS Nita Sells, MD       saccharomyces boulardii (FLORASTOR) capsule 250 mg  250 mg Oral BID Samara Deist, DPM   250 mg at 02/09/21 [id number]   traMADol (ULTRAM) tablet 50 mg  50 mg Oral Q6H PRN Nita Sells, MD       vancomycin (VANCOCIN) IVPB 1000 mg/200 mL premix  1,000 mg Intravenous Q12H Samara Deist, DPM 200 mL/hr at  02/09/21 1619 1,000 mg at 02/09/21 1619   venlafaxine XR (EFFEXOR-XR) 24 hr capsule 37.5 mg  37.5 mg Oral Q breakfast Samara Deist, DPM   37.5 mg at 02/09/21 [id number]   zinc sulfate capsule 220 mg  220 mg Oral Daily Nita Sells, MD   220 mg at 02/09/21 0827     Abtx:  Anti-infectives (From admission, onward)    Start     Dose/Rate Route Frequency Ordered Stop   02/09/21 1630  Ampicillin-Sulbactam (UNASYN) 3 g in sodium chloride 0.9 % 100 mL IVPB        3 g 200 mL/hr over 30 Minutes Intravenous Every 6 hours 02/09/21 1522     02/08/21 1300  vancomycin (VANCOCIN) IVPB 1000 mg/200 mL premix        1,000 mg 200 mL/hr over 60 Minutes Intravenous Every 12 hours 02/08/21 0054     02/08/21 0824  vancomycin (VANCOCIN) powder  Status:  Discontinued          As needed 02/08/21 0824 02/08/21 0836   02/08/21 0800  metroNIDAZOLE (FLAGYL) IVPB 500 mg  Status:  Discontinued        500 mg 100 mL/hr over 60 Minutes Intravenous Every 8 hours 02/08/21 0021 02/09/21 1522   02/08/21 0700  ceFEPIme (MAXIPIME) 2 g in sodium chloride 0.9 % 100 mL IVPB  Status:  Discontinued        2 g 200 mL/hr over 30 Minutes Intravenous Every 8 hours 02/08/21 0051 02/09/21 1522   02/08/21 0600  clindamycin (CLEOCIN) IVPB 900 mg  Status:  Discontinued        900 mg 100 mL/hr over 30 Minutes Intravenous On call to O.R. 02/08/21 0022 02/08/21 1257   02/08/21 0100  vancomycin (VANCOREADY) IVPB 1500 mg/300 mL        1,500 mg 150 mL/hr over 120 Minutes Intravenous  Once 02/08/21 0050 02/08/21 0416   02/07/21 2145  ceFEPIme (MAXIPIME) 2 g in sodium chloride 0.9 % 100 mL IVPB        2 g 200 mL/hr over 30 Minutes Intravenous  Once 02/07/21 2134 02/08/21 0027   02/07/21 2145  metroNIDAZOLE (FLAGYL) IVPB 500 mg        500 mg 100 mL/hr over 60 Minutes Intravenous  Once 02/07/21 2134 02/08/21 0109   02/07/21 2145  vancomycin (VANCOCIN) IVPB 1000 mg/200 mL premix        1,000 mg 200 mL/hr over 60 Minutes Intravenous  Once  02/07/21 2134 02/08/21 0133       REVIEW OF SYSTEMS:  Const:  fever,  chills, negative weight loss Eyes: negative diplopia or visual changes, negative eye pain ENT: negative coryza, negative sore throat Resp: negative cough, hemoptysis, dyspnea Cards: negative for chest pain, palpitations, lower extremity edema GU: negative for frequency, dysuria and hematuria GI: Nausea and vomiting.  Constipation.  General Skin: negative for rash and pruritus Heme: negative for easy bruising and gum/nose bleeding MS: Generalized  weakness Neurolo:negative for headaches, dizziness, vertigo, memory problems  Psych: negative for feelings of anxiety, depression  Endocrine: PCI, diabetes Allergy/Immunology- PCN allergy as a child but has tolerated amoxicillin Objective:  VITALS:  BP (!) 157/67 (BP Location: Left Arm)    Pulse 82    Temp 99 F (37.2 C)    Resp 18    Ht 5\' 9"  (1.753 m)    Wt 109.3 kg    LMP 02/08/2013 (Within Months)    SpO2 100%    BMI 35.59 kg/m  PHYSICAL EXAM:  General: Alert, cooperative, no distress, appears stated age.  Pale Head: Normocephalic, without obvious abnormality, atraumatic. Eyes: Conjunctivae clear, anicteric sclerae. Pupils are equal ENT Nares normal. No drainage or sinus tenderness. Lips, mucosa, and tongue normal. No Thrush Neck: symmetrical, no adenopathy, thyroid: non tender no carotid bruit and no JVD. Back: No CVA tenderness. Lungs: Clear to auscultation bilaterally. No Wheezing or Rhonchi. No rales. Heart: Regular rate and rhythm, no murmur, rub or gallop. Abdomen: Soft, non-tender,not distended. Bowel sounds normal. No masses Extremities: Left foot surgical dressing present Picture reviewed  02/08/21 post debridement   Skin: No rashes or lesions. Or bruising Lymph: Cervical, supraclavicular normal. Neurologic: Peripheral neuropathy Pertinent Labs Lab Results CBC    Component Value Date/Time   WBC 8.8 02/09/2021 0648   RBC 3.72 (L) 02/09/2021  0648   HGB 10.9 (L) 02/09/2021 0648   HGB 14.8 11/04/2016 1109   HCT 33.9 (L) 02/09/2021 0648   HCT 46.1 11/04/2016 1109   PLT 420 (H) 02/09/2021 0648   PLT 265 11/04/2016 1109   MCV 91.1 02/09/2021 0648   MCV 95 11/04/2016 1109   MCV 90 08/21/2012 1523   MCH 29.3 02/09/2021 0648   MCHC 32.2 02/09/2021 0648   RDW 12.9 02/09/2021 0648   RDW 13.3 11/04/2016 1109   RDW 13.4 08/21/2012 1523   LYMPHSABS 1.8 02/09/2021 0648   LYMPHSABS 2.7 06/05/2011 0550   MONOABS 0.9 02/09/2021 0648   MONOABS 0.4 06/05/2011 0550   EOSABS 0.4 02/09/2021 0648   EOSABS 0.6 06/05/2011 0550   BASOSABS 0.1 02/09/2021 0648   BASOSABS 0.1 06/05/2011 0550    CMP Latest Ref Rng & Units 02/09/2021 02/08/2021 02/07/2021  Glucose 70 - 99 mg/dL 240(H) 245(H) 344(H)  BUN 6 - 20 mg/dL 16 15 16   Creatinine 0.44 - 1.00 mg/dL 0.60 0.66 0.83  Sodium 135 - 145 mmol/L 133(L) 130(L) 131(L)  Potassium 3.5 - 5.1 mmol/L 3.7 4.0 4.1  Chloride 98 - 111 mmol/L 98 98 96(L)  CO2 22 - 32 mmol/L 26 24 23   Calcium 8.9 - 10.3 mg/dL 8.4(L) 8.2(L) 8.7(L)  Total Protein 6.5 - 8.1 g/dL 6.0(L) - 7.0  Total Bilirubin 0.3 - 1.2 mg/dL 0.5 - 0.7  Alkaline Phos 38 - 126 U/L 71 - 80  AST 15 - 41 U/L 13(L) - 12(L)  ALT 0 - 44 U/L 11 - 13      Microbiology: Recent Results (from the past 240 hour(s))  Resp Panel by RT-PCR (Flu A&B, Covid) Nasopharyngeal Swab     Status: None   Collection Time: 02/07/21 11:10 PM   Specimen: Nasopharyngeal Swab; Nasopharyngeal(NP) swabs in vial transport medium  Result Value Ref Range Status   SARS Coronavirus 2 by RT PCR NEGATIVE NEGATIVE Final    Comment: (NOTE) SARS-CoV-2 target nucleic acids are NOT DETECTED.  The SARS-CoV-2 RNA is generally detectable in upper respiratory specimens during the acute phase of infection. The lowest concentration of SARS-CoV-2 viral  copies this assay can detect is 138 copies/mL. A negative result does not preclude SARS-Cov-2 infection and should not be used as the sole  basis for treatment or other patient management decisions. A negative result may occur with  improper specimen collection/handling, submission of specimen other than nasopharyngeal swab, presence of viral mutation(s) within the areas targeted by this assay, and inadequate number of viral copies(<138 copies/mL). A negative result must be combined with clinical observations, patient history, and epidemiological information. The expected result is Negative.  Fact Sheet for Patients:  EntrepreneurPulse.com.au  Fact Sheet for Healthcare Providers:  IncredibleEmployment.be  This test is no t yet approved or cleared by the [place] FDA and  has been authorized for detection and/or diagnosis of SARS-CoV-2 by FDA under an Emergency Use Authorization (EUA). This EUA will remain  in effect (meaning this test can be used) for the duration of the COVID-19 declaration under Section 564(b)(1) of the Act, 21 U.S.C.section 360bbb-3(b)(1), unless the authorization is terminated  or revoked sooner.       Influenza A by PCR NEGATIVE NEGATIVE Final   Influenza B by PCR NEGATIVE NEGATIVE Final    Comment: (NOTE) The Xpert Xpress SARS-CoV-2/FLU/RSV plus assay is intended as an aid in the diagnosis of influenza from Nasopharyngeal swab specimens and should not be used as a sole basis for treatment. Nasal washings and aspirates are unacceptable for Xpert Xpress SARS-CoV-2/FLU/RSV testing.  Fact Sheet for Patients: EntrepreneurPulse.com.au  Fact Sheet for Healthcare Providers: IncredibleEmployment.be  This test is not yet approved or cleared by the [place] FDA and has been authorized for detection and/or diagnosis of SARS-CoV-2 by FDA under an Emergency Use Authorization (EUA). This EUA will remain in effect (meaning this test can be used) for the duration of the COVID-19 declaration under Section 564(b)(1) of the Act,  21 U.S.C. section 360bbb-3(b)(1), unless the authorization is terminated or revoked.  Performed at [facility], [address] [place], [place] [address]   Blood Culture (routine x 2)     Status: None (Preliminary result)   Collection Time: 02/07/21 11:10 PM   Specimen: BLOOD  Result Value Ref Range Status   Specimen Description BLOOD LEFT FOREARM  Final   Special Requests   Final    BOTTLES DRAWN AEROBIC AND ANAEROBIC Blood Culture results may not be optimal due to an excessive volume of blood received in culture bottles   Culture   Final    NO GROWTH 2 DAYS Performed at [facility], [address] [place], [place] [address]    Report Status PENDING  Incomplete  Blood Culture (routine x 2)     Status: None (Preliminary result)   Collection Time: 02/07/21 11:10 PM   Specimen: BLOOD  Result Value Ref Range Status   Specimen Description BLOOD LEFT ASSIST CONTROL  Final   Special Requests   Final    BOTTLES DRAWN AEROBIC AND ANAEROBIC Blood Culture adequate volume   Culture   Final    NO GROWTH 2 DAYS Performed at [facility], [address] [place], [place] [address]    Report Status PENDING  Incomplete  Urine Culture     Status: None   Collection Time: 02/07/21 11:10 PM   Specimen: In/Out Cath Urine  Result Value Ref Range Status   Specimen Description   Final    IN/OUT CATH URINE Performed at [facility], [address], [place], [place] [address]    Special Requests   Final  NONE Performed at [facility], [address] [place], [place] [address]    Culture   Final    NO GROWTH Performed at [facility], [place] [address], [place], [place] [address]    Report Status 02/09/2021 FINAL  Final  Aerobic/Anaerobic Culture w Gram Stain (surgical/deep wound)     Status: None (Preliminary result)   Collection Time: 02/08/21  7:55 AM   Specimen: PATH Bone resection; Tissue  Result Value Ref Range Status    Specimen Description   Final    BONE LEFT CALCANEUS Performed at [facility], [address] [place], [place] [address]    Special Requests   Final    NONE Performed at [facility], [place], [place] [address]    Gram Stain RARE WBC SEEN FEW GRAM POSITIVE COCCI   Final   Culture   Final    MODERATE ENTEROCOCCUS FAECALIS SUSCEPTIBILITIES TO FOLLOW Performed at [facility], [place] [address] [place], [place] [address]    Report Status PENDING  Incomplete    IMAGING RESULTS: I have personally reviewed the films ? Impression/Recommendation Diabetic foot infection with peripheral neuropathy with necrotic wound and osteomyelitis of the calcaneum Status postdebridement of the wound and partial calcanectomy Patient is currently on vancomycin cefepime and Flagyl. Enterococcus faecalis and wound culture Awaiting f finalization of the culture Will continue with vancomycin. Will change cefepime and Flagyl to Unasyn.  Will need IV antibiotics on discharge.  She is currently not ready for discharge yet. She is going to need 4 to 6 weeks of IV  Diabetes mellitus on insulin, metformin  Coronary artery disease    Hyperlipidemia on pravastatin and fenofibrate  CHF on carvedilol ___________________________________________________ Discussed with patient, and her wife at bedside. Discussed with requesting provider Note:  This document was prepared using Dragon voice recognition software and may include unintentional dictation errors.

## 2021-02-09 NOTE — Progress Notes (Signed)
Inpatient Diabetes Program Recommendations  AACE/ADA: New Consensus Statement on Inpatient Glycemic Control (2015)  Target Ranges:  Prepandial:   less than 140 mg/dL      Peak postprandial:   less than 180 mg/dL (1-2 hours)      Critically ill patients:  140 - 180 mg/dL   Lab Results  Component Value Date   GLUCAP 245 (H) 02/09/2021   HGBA1C 12.9 (H) 02/08/2021    Review of Glycemic Control  Latest Reference Range & Units 02/08/21 08:43 02/08/21 16:34 02/08/21 20:46 02/09/21 07:59 02/09/21 11:47  Glucose-Capillary 70 - 99 mg/dL 170 (H) 017 (H) 494 (H) 247 (H) 245 (H)   Diabetes history: DM 2 Outpatient Diabetes medications:  Metformin 1500 mg bid, Mounjaro 5 mg weekly, Novolin 70/30 70 units bid Current orders for Inpatient glycemic control:  Novolog 0-6 units tid with meals Semglee 10 units daily Inpatient Diabetes Program Recommendations:    Spoke with patient and partner regarding importance of glucose control again. They are interested in CGM to help monitoring. Patient is familiar with application process.  Left sensor, reader and instructions with them just in case patient is d/c'd this weekend.  Explained again the importance of glucose control for healing.  Agree with start of basal insulin.   May consider increasing Semglee to 20 units daily and add Novolog meal coverage 3 units tid with meals (hold if patient eats less than 50 % or NPO). Will likely need to be switched back to 70/30 once she is d/c'd home due to cost.    Thanks,  Beryl Meager, RN, BC-ADM Inpatient Diabetes Coordinator Pager [phone]  (8a-5p)

## 2021-02-09 NOTE — Consult Note (Signed)
WOC Nurse Consult Note: Dr. Rosetta Posner, Podiatrist, reached out via Secure Chat and requested that I order [facility] supplies for this patient so that he can apply the [facility] tomorrow.  In completing this request, I spoke with the Unit Secretary and provided the following information for ordering, and requested that the supplies be placed in the patient room when they arrive. Place the following supplies at the bedside today (2/3) for use by the doctor tomorrow 2/4:  VAC cannister [employer] [phone]); [facility] dressing [employer] [phone]); Mepitel [employer] [place] [phone]).  These come from [employer]. And, a VAC machine that comes from [employer].  WOC nurse will not follow at this time.   Helmut Muster, RN, MSN, CWOCN, CNS-BC, pager [phone]

## 2021-02-09 NOTE — Progress Notes (Signed)
Pharmacy Antibiotic Note  Nancy [name] is a 56 y.o. female admitted on 02/07/2021 with sepsis and diabetic foot ulcer . Pharmacy has been consulted for Vanc, Cefepime dosing.  Plan: Vancomycin 2500 mg IV loading dose, followed by Vancomycin 1 g IV Q12H  Est AUC = 522.4 Est Vanc trough = 14.9   Cefepime 2 g IV Q8H   Pt also ordered Flagyl 500 mg IV q8h   Monitor clinical picture, renal function, and vancomycin levels at steady state F/U C&S, abx deescalation / LOT   Height: 5\' 9"  (175.3 cm) Weight: 109.3 kg (241 lb) IBW/kg (Calculated) : 66.2  Temp (24hrs), Avg:99.5 F (37.5 C), Min:97.8 F (36.6 C), Max:102.6 F (39.2 C)  Recent Labs  Lab 02/06/21 1528 02/07/21 2310 02/08/21 0148 02/08/21 0318 02/08/21 1411 02/09/21 0648  WBC 11.6* 12.1*  --   --  11.0* 8.8  CREATININE 0.86 0.83  --   --  0.66 0.60  LATICACIDVEN  --  1.5 8.5* 0.9  --   --      Estimated Creatinine Clearance: 103.4 mL/min (by C-G formula based on SCr of 0.6 mg/dL).    Allergies  Allergen Reactions   Shellfish Allergy Anaphylaxis   Codeine Other (See Comments)    Migraine   Morphine And Related Other (See Comments)    Reaction:  Severe migraines    Percocet [Oxycodone-Acetaminophen] Nausea And Vomiting   Rosuvastatin     Other reaction(s): Muscle Pain   Penicillins Rash and Other (See Comments)    Has patient had a PCN reaction causing immediate rash, facial/tongue/throat swelling, SOB or lightheadedness with hypotension: No Has patient had a PCN reaction causing severe rash involving mucus membranes or skin necrosis: No Has patient had a PCN reaction that required hospitalization No Has patient had a PCN reaction occurring within the last 10 years: No If all of the above answers are "NO", then may proceed with Cephalosporin use.   Sulfa Antibiotics Rash   Theophyllines Itching and Rash    Antimicrobials this admission: 2/1 cefepime >>  2/1 Flagyl >> 2/2 Vancomycin >>   Dose  adjustments this admission:   Microbiology results: 2/1 BCx: NG x 2 days  2/1 UCx: NG 2/2 bone cx: E. Faecalis    Thank you for allowing pharmacy to be a part of this patients care.  04/09/21, PharmD 02/09/2021 2:12 PM

## 2021-02-09 NOTE — Progress Notes (Signed)
Progress Note   Patient: Nancy [name] DDU:[id number] DOB: [birth year] DOA: 02/07/2021     1 DOS: the patient was seen and examined on 02/09/2021   Brief hospital course:  56 year old Caucasian female community dwelling Prior cellulitis dating back to 03/2015-group B strep--left second toe osteomyelitis status post amputation 05/2011 Prior R TKR 2013  Patient follows with Dr. Gwyneth Revels D.P.M.--last seen in this office 01/31/2021 and noted to have posterior left heel ulcer, 3X2X 1 cm-was debrided to 3X2X 1.5 cm and deep fibrotic nonviable and necrotic tissue was removed  In addition patient was seen by Dr. Barbara Cower dew on 1/24 with nonhealing ulceration of left heel-ABIs showed 1.1 bilaterally with diminished waveforms on the right--it was felt that patient may have tibial disease and angiogram was contemplated  Patient called the office on 1/26 and was found to have fever 103.3 as well as severe pain-patient was told to present to the emergency room   Assessment and Plan: * Diabetic foot ulcer (HCC)- (present on admission) Underwent debridement under the care of Dr. Ether Griffins with full-thickness excisional debridement arthrotomy and I&D septic left ankle antibiotic impregnated CaSo4 beads Patient is very reluctant to consider any type of amputation so we have deferred consulting vascular surgery at this time I have asked ID informally to make a recommendation regarding the moderate Enterococcus faecalis growing in deep wound culture--may need an extended course of antibiotics PT OT has been consulted and patient will be nonweightbearing on that he will to assist with healing Nausea, vomiting DDx gastroparesis?-Abdomen is benign to exam Nursing to let me know if continues will get plain film x-ray rule out other sources, patient passing gas and stool sepsis secondary to diabetic foot infection- (present on admission) Pt meets sepsis criteria from admission follow deep wound as well as  anaerobic intraoperative cultures.  Diabetes mellitus, type 2 (HCC) Uncontrolled-A1c 12.4 indicating hyperglycemia predominantly Adding long-acting insulin 10 units daily Continue metformin 1500 twice daily-[upper limit of normal] Hyponatremia- (present on admission) Cont with MIVf at low rate. Improved Tobacco abuse- (present on admission) Nicotine patch. Essential hypertension- (present on admission) cont Coreg 3.125 twice daily meals, hydralazine 10 every 6 as needed pressure >160 Pain 10/10 add back lisinopril from prior to admission    Long discussion at the bedside with patient's husband significant other [name] [phone]     Subjective:   Pain is 7/10 3 episodes of nausea/vomiting this morning-relates intolerances to many medications for pain control Tells me she cannot take tramadol without issue, tells me that Dilaudid works best for her pain  Physical Exam: Vitals:   02/09/21 0111 02/09/21 0551 02/09/21 0757 02/09/21 1145  BP: 117/70 114/64 (!) 141/72 124/68  Pulse: 79 73 71 71  Resp: 18 20 19 18   Temp: 99.7 F (37.6 C) 98 F (36.7 C) 97.8 F (36.6 C) 98 F (36.7 C)  TempSrc: Oral Oral    SpO2: 97% 99% 97% 100%  Weight:      Height:       Obese pleasant white female in no distress Thick neck Mallampati 4 S1-S2 no murmur no rub no gallop Abdomen soft no rebound no guarding quite obese Chest is clear no rales no rhonchi Left lower extremity is wrapped in Coban with reinforce dressing at heel I do not appreciate any lower extremity edema  Data Reviewed:  Sodium has corrected range WBCs improved to 8 platelet is 420  Family Communication: Discussed with patient alone  Disposition: Status is: Inpatient Remains inpatient appropriate because:  needs BKA?   Planned Discharge Destination:  Unclear at this time will need to monitor the wound and ensure healing continues to occur --defer this to Dr. Ether Griffins    Time spent: 24  minutes  Author: Rhetta Mura, MD 02/09/2021 1:50 PM  For on call review [email or web address].

## 2021-02-10 DIAGNOSIS — E11628 Type 2 diabetes mellitus with other skin complications: Secondary | ICD-10-CM | POA: Diagnosis not present

## 2021-02-10 DIAGNOSIS — M86172 Other acute osteomyelitis, left ankle and foot: Secondary | ICD-10-CM | POA: Diagnosis not present

## 2021-02-10 DIAGNOSIS — E11621 Type 2 diabetes mellitus with foot ulcer: Secondary | ICD-10-CM | POA: Diagnosis not present

## 2021-02-10 DIAGNOSIS — L089 Local infection of the skin and subcutaneous tissue, unspecified: Secondary | ICD-10-CM | POA: Diagnosis not present

## 2021-02-10 DIAGNOSIS — L97429 Non-pressure chronic ulcer of left heel and midfoot with unspecified severity: Secondary | ICD-10-CM | POA: Diagnosis not present

## 2021-02-10 LAB — GLUCOSE, CAPILLARY
Glucose-Capillary: 214 mg/dL — ABNORMAL HIGH (ref 70–99)
Glucose-Capillary: 277 mg/dL — ABNORMAL HIGH (ref 70–99)
Glucose-Capillary: 232 mg/dL — ABNORMAL HIGH (ref 70–99)
Glucose-Capillary: 220 mg/dL — ABNORMAL HIGH (ref 70–99)

## 2021-02-10 LAB — CREATININE, SERUM
Creatinine, Ser: 0.65 mg/dL (ref 0.44–1.00)
GFR, Estimated: >60 mL/min (ref >60)

## 2021-02-10 MED ORDER — HYDROMORPHONE HCL 2 MG PO TABS
2.0000 mg | ORAL_TABLET | ORAL | Status: DC | PRN
Start: 2021-02-10 — End: 2021-02-18
  Administered 2021-02-11 – 2021-02-18 (×26): 2 mg via ORAL
  Filled 2021-02-10 (×27): qty 1

## 2021-02-10 MED ORDER — INSULIN GLARGINE-YFGN 100 UNIT/ML ~~LOC~~ SOLN
15.0000 [IU] | Freq: Every day | SUBCUTANEOUS | Status: DC
  Administered 2021-02-11: 15 [IU] via SUBCUTANEOUS
  Filled 2021-02-10 (×2): qty 0.15

## 2021-02-10 MED ORDER — SODIUM CHLORIDE 0.9 % IR SOLN
1000.0000 mL | Freq: Once | Status: AC
  Administered 2021-02-10: 1000 mL

## 2021-02-10 NOTE — Progress Notes (Addendum)
Daily Progress Note   Subjective  - 2 Days Post-Op  F/u left heel debridement.  Patient has tolerated the procedure well so far and is having mild pain to the posterior aspect of the back of the heel.  Patient stayed off of her foot.  Patient denies nausea, vomiting, fevers, chills at this time.  Objective Vitals:   02/09/21 1500 02/09/21 2053 02/10/21 0335 02/10/21 0759  BP: (!) 157/67 (!) 163/79 (!) 165/85 128/75  Pulse: 82 82 87 71  Resp: 18 20 16 18   Temp: 99 F (37.2 C) 99 F (37.2 C) 98.6 F (37 C) 98.4 F (36.9 C)  TempSrc:      SpO2: 100% 97% 94% 96%  Weight:      Height:        Physical Exam: At this point the posterior heel is stable.  No further purulence at this time.  The bones exposed to the entire posterior superior aspect of the calcaneus and posterior ankle.  Some fibrotic tissue residual.  Scant areas of necrosis also seen.  Improvement in redness and swelling but still appears to be inflamed.  Wound measures 7 cm x 5 cm x 2 cm.  Predebridement   Postdebridement   Laboratory CBC    Component Value Date/Time   WBC 8.8 02/09/2021 0648   HGB 10.9 (L) 02/09/2021 0648   HGB 14.8 11/04/2016 1109   HCT 33.9 (L) 02/09/2021 0648   HCT 46.1 11/04/2016 1109   PLT 420 (H) 02/09/2021 0648   PLT 265 11/04/2016 1109    BMET    Component Value Date/Time   NA 133 (L) 02/09/2021 0648   NA 134 11/04/2016 1109   NA 133 (L) 08/21/2012 1523   K 3.7 02/09/2021 0648   K 4.1 08/21/2012 1523   CL 98 02/09/2021 0648   CL 102 08/21/2012 1523   CO2 26 02/09/2021 0648   CO2 26 08/21/2012 1523   GLUCOSE 240 (H) 02/09/2021 0648   GLUCOSE 306 (H) 08/21/2012 1523   BUN 16 02/09/2021 0648   BUN 16 11/04/2016 1109   BUN 17 08/21/2012 1523   CREATININE 0.65 02/10/2021 0408   CREATININE 0.69 08/21/2012 1523   CALCIUM 8.4 (L) 02/09/2021 0648   CALCIUM 8.8 08/21/2012 1523   GFRNONAA >60 02/10/2021 0408   GFRNONAA >60 08/21/2012 1523   GFRAA 117 11/04/2016 1109   GFRAA  >60 08/21/2012 1523    Assessment/Planning: Status post debridement of posterior heel decubitus ulcer with pathological calcaneal fracture and septic ankle arthritis Diabetes with neuropathy, uncontrolled Osteomyelitis of calcaneus - enterococcus  At this point the foot is stable postoperative day #2.  We will have to continue to monitor for improvement or regression of the wound at this time.  There is a large amount of bone exposed to the posterior calcaneus at this time. Bedside further wound debridement was performed with 15 blade without incident.  Patient tolerated well.  Removed nonviable necrotic tissue present throughout the entirety of the wound, 100% excisional to bone.  Postdebridement measurement same as predebridement 7 cm x 5 cm x 2 cm. Discussed amputation but at this point she would like to try any and all options prior to amputation.  In agreement with Dr. 08/23/2012, the only attempts at limb salvage would be attempt to granulation getting the infection under control with possible skin graft placement eventually and possible flap to the posterior aspect of the heel via plastic surgery. Likely would recommend taking patient back early next week for  further debridement and continued application of wound VAC.  Wound still appears to be slightly contaminated with large amount of bone exposed. I discussed this would likely take months/years to heal and it may never heal. According to path report and intraop culture patient does have osteomyelitis of the calcaneus - enterococcus.  Appreciate ID recommendations.  Will likely need 6w IV Abx and prolonged Abx potentially thereafter. Dr. Ether Griffins will reach out to plastic surgery if they are willing to evaluate for possible flap in the future as outpatient. Wound vac applied today.  Recommend changes 3x weekly.  Will coordinate with wound care team for changes, VAC will need changed Monday.  Will f/u on Monday for further evaluation.  If VAC  leakage occurs recommend Dakin's or saline wet-to-dry dressings to be performed daily.  Order placed.  Rosetta Posner, DPM  02/10/2021, 10:28 AM

## 2021-02-10 NOTE — Consult Note (Addendum)
WOC Nurse Consult Note: Consult received for NPWT dressing changes 3xW.  WOC Nursing will see on Monday, 2/6.  Note:  Dr.Baker indicates that he will also follow up on Monday, and that a return to the OR is possible early next week.WOC nursing to coordinate with Dr. Whitman Hero for definitive plan on Monday. Dr. Bernette Redbird provision of topical care in the event the NPWT dressing leaks or if staff are unable to maintain a seal are appreciated.   WOC nursing team will follow, and will remain available to this patient, the nursing and medical teams.   Thanks, Ladona Mow, MSN, RN, GNP, [place]  Pager# [phone]

## 2021-02-10 NOTE — Progress Notes (Signed)
Inpatient Diabetes Program Recommendations  AACE/ADA: New Consensus Statement on Inpatient Glycemic Control (2015)  Target Ranges:  Prepandial:   less than 140 mg/dL      Peak postprandial:   less than 180 mg/dL (1-2 hours)      Critically ill patients:  140 - 180 mg/dL   Lab Results  Component Value Date   GLUCAP 277 (H) 02/10/2021   HGBA1C 12.9 (H) 02/08/2021    Review of Glycemic Control  Latest Reference Range & Units 02/10/21 07:31 02/10/21 12:07  Glucose-Capillary 70 - 99 mg/dL 159 (H) 458 (H)  (H): Data is abnormally high  Current orders for Inpatient glycemic control: Novolog 0-6 units TID, Semglee 10 units QD, Metformin 1500 mg QAM and 1000 mg QPM  Inpatient Diabetes Program Recommendations:    Semglee 15 units QAM (could give additional 5 units now as she has had am dose of 10 units) Novolog 3 units TID with meals  Will continue to follow while inpatient.  Thank you, Dulce Sellar, MSN, RN Diabetes Coordinator Inpatient Diabetes Program [phone] (team pager from 8a-5p)

## 2021-02-10 NOTE — Progress Notes (Signed)
ID Pt doing fine No fever Tolerated unasyn No rash No itching Pt moving with knee scooter Wound vac failed this morning  O/E Awake and aleer Patient Vitals for the past 24 hrs:  BP Temp Pulse Resp SpO2  02/10/21 1525 (!) 145/77 98.5 F (36.9 C) 90 18 97 %  02/10/21 1212 130/70 98.4 F (36.9 C) 78 18 97 %  02/10/21 0759 128/75 98.4 F (36.9 C) 71 18 96 %  02/10/21 0335 (!) 165/85 98.6 F (37 C) 87 16 94 %  02/09/21 2053 (!) 163/79 99 F (37.2 C) 82 20 97 %   Chest CTA Hss1s2 Abd soft Left foot wound picture reviewed   CNS non focal   Labs CBC Latest Ref Rng & Units 02/09/2021 02/08/2021 02/07/2021  WBC 4.0 - 10.5 K/uL 8.8 11.0(H) 12.1(H)  Hemoglobin 12.0 - 15.0 g/dL 10.9(L) 11.5(L) 11.9(L)  Hematocrit 36.0 - 46.0 % 33.9(L) 35.5(L) 37.0  Platelets 150 - 400 K/uL 420(H) 469(H) 430(H)    CMP Latest Ref Rng & Units 02/10/2021 02/09/2021 02/08/2021  Glucose 70 - 99 mg/dL - 240(H) 245(H)  BUN 6 - 20 mg/dL - 16 15  Creatinine 0.44 - 1.00 mg/dL 0.65 0.60 0.66  Sodium 135 - 145 mmol/L - 133(L) 130(L)  Potassium 3.5 - 5.1 mmol/L - 3.7 4.0  Chloride 98 - 111 mmol/L - 98 98  CO2 22 - 32 mmol/L - 26 24  Calcium 8.9 - 10.3 mg/dL - 8.4(L) 8.2(L)  Total Protein 6.5 - 8.1 g/dL - 6.0(L) -  Total Bilirubin 0.3 - 1.2 mg/dL - 0.5 -  Alkaline Phos 38 - 126 U/L - 71 -  AST 15 - 41 U/L - 13(L) -  ALT 0 - 44 U/L - 11 -    Micro Wound culture emterococcus fecalis  Impression/recommendation Diabetic foot infection with necrotic wound with osteomyelitis- s/p debridement- wound has exposed calcaneum Seen by podiatrist today -bedside debridement done today  for wound vac- possible OR early next week depending on progress of wound Continue unasyn and vanco until culture is finalized So far enterococcus. Rare staph epidermidis is likely a skin contaminant and not a true pathogen- anerobes pending'  DM- on insulin, metformin  PCN allergy as a child- tolerating unasyn well  Discussed the  management with the patient and her wife Will see her again on Monday

## 2021-02-10 NOTE — Evaluation (Signed)
Physical Therapy Evaluation Patient Details Name: Nancy [name] MRN: [id number] DOB: [birth year] Today's Date: 02/10/2021  History of Present Illness  Nancy [name] is a 56 y.o. female admitted on 02/07/2021 with sepsis and diabetic foot ulcer. Patient underwent full-thickness excisional debridement and removal of posterior superior calcaneus left posterior heel on 02/08/21.  Clinical Impression  Pt seen for PT evaluation with pt agreeable despite citing significant pain. Pt is mod I overall with mobility with knee scooter to ambulate 1 lap around nurses station & PT managing wound vac. PT provides pt with HEP & pt performs standing LLE strengthening exercises with cuing for technique; HEP given to maintain strength until pt is cleared to weight bear again. Pt reports comfort with all mobility tasks as she reports she's been NWB since November 2022. Pt denies any concerns re: mobility at this time. PT to sign off & pt in agreement; notified nurse of recommendation of pt to ambulate with nursing staff while in house.        Recommendations for follow up therapy are one component of a multi-disciplinary discharge planning process, led by the attending physician.  Recommendations may be updated based on patient status, additional functional criteria and insurance authorization.  Follow Up Recommendations No PT follow up    Assistance Recommended at Discharge PRN  Patient can return home with the following  A little help with bathing/dressing/bathroom;Assistance with cooking/housework    Equipment Recommendations None recommended by PT  Recommendations for Other Services       Functional Status Assessment Patient has not had a recent decline in their functional status     Precautions / Restrictions Precautions Precautions: None Restrictions Weight Bearing Restrictions: Yes LLE Weight Bearing: Non weight bearing      Mobility  Bed Mobility Overal bed mobility: Modified  Independent             General bed mobility comments: HOB slightly elevated, slight use of bed rails    Transfers Overall transfer level: Needs assistance Equipment used:  (knee scooter) Transfers: Sit to/from Stand Sit to Stand: Modified independent (Device/Increase time)                Ambulation/Gait Ambulation/Gait assistance: Modified independent (Device/Increase time) Gait Distance (Feet): 160 Feet Assistive device:  (knee scooter)   Gait velocity: slightly decreased     General Gait Details: Pt ambulates with step to pattern with knee scooter  Stairs            Wheelchair Mobility    Modified Rankin (Stroke Patients Only)       Balance Overall balance assessment: Needs assistance Sitting-balance support: No upper extremity supported, Feet supported Sitting balance-Leahy Scale: Normal     Standing balance support: During functional activity Standing balance-Leahy Scale: Good                               Pertinent Vitals/Pain Pain Assessment Pain Assessment: 0-10 Pain Score: 8  Pain Location: L heel Pain Descriptors / Indicators: Discomfort, Dull Pain Intervention(s): Premedicated before session, Monitored during session    Home Living Family/patient expects to be discharged to:: Private residence Living Arrangements: Spouse/significant other   Type of Home: Apartment Home Access: Level entry       Home Layout: One level Home Equipment: Agricultural consultant (2 wheels);Shower seat;Other (comment) (knee scooter) Additional Comments: home setup for mothers house - plan to d/c there for handicap accessiblity and 24/7 supervision  Prior Function Prior Level of Function : Independent/Modified Independent             Mobility Comments: Pt reports she's been using knee scooter since November 2/2 NWB status & has negotiated stairs with NWB as well.       Hand Dominance        Extremity/Trunk Assessment   Upper  Extremity Assessment Upper Extremity Assessment: Overall WFL for tasks assessed    Lower Extremity Assessment Lower Extremity Assessment: Overall WFL for tasks assessed       Communication   Communication: No difficulties  Cognition Arousal/Alertness: Awake/alert Behavior During Therapy: WFL for tasks assessed/performed Overall Cognitive Status: Within Functional Limits for tasks assessed                                          General Comments      Exercises Other Exercises Other Exercises: Provided pt with HEP & pt performs standing: marching, hip flexion, hip extension, hip abduction, hamstring curls   Assessment/Plan    PT Assessment Patient does not need any further PT services  PT Problem List         PT Treatment Interventions      PT Goals (Current goals can be found in the Care Plan section)  Acute Rehab PT Goals Patient Stated Goal: go home with family PT Goal Formulation: With patient Time For Goal Achievement: 02/24/21 Potential to Achieve Goals: Good    Frequency       Co-evaluation               AM-PAC PT "6 Clicks" Mobility  Outcome Measure Help needed turning from your back to your side while in a flat bed without using bedrails?: None Help needed moving from lying on your back to sitting on the side of a flat bed without using bedrails?: None Help needed moving to and from a bed to a chair (including a wheelchair)?: None Help needed standing up from a chair using your arms (e.g., wheelchair or bedside chair)?: None Help needed to walk in hospital room?: None Help needed climbing 3-5 steps with a railing? : A Little 6 Click Score: 23    End of Session   Activity Tolerance: Patient tolerated treatment well Patient left: in chair;with call bell/phone within reach (wound vac intact) Nurse Communication: Mobility status      Time: 4665-9935 PT Time Calculation (min) (ACUTE ONLY): 22 min   Charges:   PT  Evaluation $PT Eval Low Complexity: 1 Low PT Treatments $Therapeutic Activity: 8-22 mins        Nancy [name], PT, DPT 02/10/21, 12:47 PM   Nancy [name] 02/10/2021, 12:45 PM

## 2021-02-10 NOTE — Progress Notes (Signed)
Patient refused evening dose of Metformin because she feels that it is causing an upset stomach and she is not able to get to the bathroom as quickly as she is at home. Patient remains on sliding scale insulin.

## 2021-02-10 NOTE — Progress Notes (Signed)
°  Progress Note   Patient: Nancy [name] GUR:[id number] DOB: May 05, 1965 DOA: 02/07/2021     2 DOS: the patient was seen and examined on 02/10/2021   Brief hospital course:  56 year old Caucasian female community dwelling Prior cellulitis dating back to 03/2015-group B strep--left second toe osteomyelitis status post amputation 05/2011 Prior R TKR 2013  Patient follows with Dr. Gwyneth Revels D.P.M.--last seen in this office 01/31/2021 and noted to have posterior left heel ulcer, 3X2X 1 cm-was debrided to 3X2X 1.5 cm and deep fibrotic nonviable and necrotic tissue was removed  In addition patient was seen by Dr. Barbara Cower dew on 1/24 with nonhealing ulceration of left heel-ABIs showed 1.1 bilaterally with diminished waveforms on the right--it was felt that patient may have tibial disease and angiogram was contemplated  Patient called the office on 1/26 and was found to have fever 103.3 as well as severe pain-patient was told to present to the emergency room   Assessment and Plan: * Diabetic foot ulcer (HCC)- (present on admission) Underwent debridement under the care of Dr. Ether Griffins with full-thickness excisional debridement arthrotomy and I&D septic left ankle antibiotic impregnated CaSo4 beads Patient reluctant to consider BKA--defer consulting vascular surgery at this time ID input appreciated transitioned ceftriaxone/Flagyl-->02/09/2021 to Unasyn-likely completing 6 weeks on 03/20/2021 PT OT has been consulted and patient will be nonweightbearing on that he will to assist with healing Nausea, vomiting 02/09/2021 DDx gastroparesis?-Abdomen is benign to exam It has resolved sepsis secondary to diabetic foot infection- (present on admission) Pt meets sepsis criteria from admission follow deep wound as well as anaerobic intraoperative cultures.  Diabetes mellitus, type 2 (HCC) Uncontrolled-A1c 12.4 indicating hyperglycemia predominantly increase long-acting insulin 10 -->15 units daily Continue  metformin 1500 twice daily-[upper limit of normal] Hyponatremia- (present on admission) Saline locked IV 2/3 Improved Tobacco abuse- (present on admission) Nicotine patch. Essential hypertension- (present on admission) cont Coreg 3.125 twice daily meals, hydralazine 10 every 6 as needed pressure >160  Long discussion at the bedside with patient's husband significant other Nancy [name] [phone] on 02/09/2021     Subjective:   Pain is 5/10 slightly better than prior No further nausea vomiting No diarrhea Wound VAC will be reassessed in the next several days  Physical Exam: Vitals:   02/10/21 0335 02/10/21 0759 02/10/21 1212 02/10/21 1525  BP: (!) 165/85 128/75 130/70 (!) 145/77  Pulse: 87 71 78 90  Resp: 16 18 18 18   Temp: 98.6 F (37 C) 98.4 F (36.9 C) 98.4 F (36.9 C) 98.5 F (36.9 C)  TempSrc:      SpO2: 94% 96% 97% 97%  Weight:      Height:       Obese pleasant white female in no distress Thick neck Mallampati 4 S1-S2 no murmur no rub no gallop Abdomen soft no rebound no guarding quite obese Chest is clear no rales no rhonchi Left lower extremity is wrapped in Coban with reinforce dressing at heel wound not reviewed today  Data Reviewed:  No labs  Family Communication: Discussed with patient alone disposition: Status is: Inpatient Remains inpatient appropriate because: needs BKA?   Planned Discharge Destination:  Unclear at this time will need to monitor the wound and ensure healing continues to occur --defer this to Dr.    Time spent: 24 minutes  Author: Ether Griffins, MD 02/10/2021 3:30 PM  For on call review [email or web address].

## 2021-02-10 NOTE — Plan of Care (Signed)
  Problem: Education: Goal: Knowledge of General Education information will improve Description: Including pain rating scale, medication(s)/side effects and non-pharmacologic comfort measures Outcome: Progressing   Problem: Health Behavior/Discharge Planning: Goal: Ability to manage health-related needs will improve Outcome: Progressing   Problem: Clinical Measurements: Goal: Will remain free from infection Outcome: Not Progressing   

## 2021-02-11 DIAGNOSIS — L97429 Non-pressure chronic ulcer of left heel and midfoot with unspecified severity: Secondary | ICD-10-CM | POA: Diagnosis not present

## 2021-02-11 DIAGNOSIS — E11621 Type 2 diabetes mellitus with foot ulcer: Secondary | ICD-10-CM | POA: Diagnosis not present

## 2021-02-11 LAB — CBC WITH DIFFERENTIAL/PLATELET
Abs Immature Granulocytes: 0.11 10*3/uL — ABNORMAL HIGH (ref 0.00–0.07)
Basophils Absolute: 0.1 10*3/uL (ref 0.0–0.1)
Basophils Relative: 1 %
Eosinophils Absolute: 0.4 10*3/uL (ref 0.0–0.5)
Eosinophils Relative: 5 %
HCT: 31.6 % — ABNORMAL LOW (ref 36.0–46.0)
Hemoglobin: 10.1 g/dL — ABNORMAL LOW (ref 12.0–15.0)
Immature Granulocytes: 1 %
Lymphocytes Relative: 25 %
Lymphs Abs: 2.2 10*3/uL (ref 0.7–4.0)
MCH: 29.7 pg (ref 26.0–34.0)
MCHC: 32 g/dL (ref 30.0–36.0)
MCV: 92.9 fL (ref 80.0–100.0)
Monocytes Absolute: 0.8 10*3/uL (ref 0.1–1.0)
Monocytes Relative: 10 %
Neutro Abs: 5.2 10*3/uL (ref 1.7–7.7)
Neutrophils Relative %: 58 %
Platelets: 493 10*3/uL — ABNORMAL HIGH (ref 150–400)
RBC: 3.4 MIL/uL — ABNORMAL LOW (ref 3.87–5.11)
RDW: 12.4 % (ref 11.5–15.5)
WBC: 8.8 10*3/uL (ref 4.0–10.5)
nRBC: 0 % (ref 0.0–0.2)

## 2021-02-11 LAB — GLUCOSE, CAPILLARY
Glucose-Capillary: 201 mg/dL — ABNORMAL HIGH (ref 70–99)
Glucose-Capillary: 257 mg/dL — ABNORMAL HIGH (ref 70–99)
Glucose-Capillary: 240 mg/dL — ABNORMAL HIGH (ref 70–99)
Glucose-Capillary: 267 mg/dL — ABNORMAL HIGH (ref 70–99)

## 2021-02-11 LAB — COMPREHENSIVE METABOLIC PANEL
ALT: 11 U/L (ref 0–44)
AST: 10 U/L — ABNORMAL LOW (ref 15–41)
Albumin: 1.8 g/dL — ABNORMAL LOW (ref 3.5–5.0)
Alkaline Phosphatase: 57 U/L (ref 38–126)
Anion gap: 8 (ref 5–15)
BUN: 11 mg/dL (ref 6–20)
CO2: 28 mmol/L (ref 22–32)
Calcium: 8.5 mg/dL — ABNORMAL LOW (ref 8.9–10.3)
Chloride: 99 mmol/L (ref 98–111)
Creatinine, Ser: 0.62 mg/dL (ref 0.44–1.00)
GFR, Estimated: >60 mL/min (ref >60)
Glucose, Bld: 255 mg/dL — ABNORMAL HIGH (ref 70–99)
Potassium: 3.6 mmol/L (ref 3.5–5.1)
Sodium: 135 mmol/L (ref 135–145)
Total Bilirubin: 0.4 mg/dL (ref 0.3–1.2)
Total Protein: 5.7 g/dL — ABNORMAL LOW (ref 6.5–8.1)

## 2021-02-11 MED ORDER — INSULIN GLARGINE-YFGN 100 UNIT/ML ~~LOC~~ SOLN
20.0000 [IU] | Freq: Every day | SUBCUTANEOUS | Status: DC
  Administered 2021-02-12 – 2021-02-13 (×2): 20 [IU] via SUBCUTANEOUS
  Filled 2021-02-11 (×2): qty 0.2

## 2021-02-11 MED ORDER — METFORMIN HCL 500 MG PO TABS
500.0000 mg | ORAL_TABLET | Freq: Every day | ORAL | Status: DC
  Filled 2021-02-11: qty 1

## 2021-02-11 MED ORDER — METFORMIN HCL 500 MG PO TABS
500.0000 mg | ORAL_TABLET | Freq: Every day | ORAL | Status: DC
  Administered 2021-02-11: 500 mg via ORAL
  Filled 2021-02-11 (×2): qty 1

## 2021-02-11 NOTE — Progress Notes (Signed)
Inpatient Diabetes Program Recommendations  AACE/ADA: New Consensus Statement on Inpatient Glycemic Control (2015)  Target Ranges:  Prepandial:   less than 140 mg/dL      Peak postprandial:   less than 180 mg/dL (1-2 hours)      Critically ill patients:  140 - 180 mg/dL   Lab Results  Component Value Date   GLUCAP 201 (H) 02/11/2021   HGBA1C 12.9 (H) 02/08/2021    Review of Glycemic Control  Latest Reference Range & Units 02/10/21 07:31 02/10/21 12:07 02/10/21 16:01 02/10/21 19:48 02/11/21 07:21  Glucose-Capillary 70 - 99 mg/dL 902 (H) 409 (H) 735 (H) 220 (H) 201 (H)  (H): Data is abnormally high  Current orders for Inpatient glycemic control: Current orders for Inpatient glycemic control: Novolog 0-6 units TID, Semglee 15 units QD, Metformin 1500 mg QAM and 1000 mg QPM  Inpatient Diabetes Program Recommendations:    Novolog 3 units TID with meals.  If MD does not want to add meal coverage, please consider Novolog 0-9 units TID.    Will continue to follow while inpatient.  Thank you, Dulce Sellar, MSN, RN Diabetes Coordinator Inpatient Diabetes Program [phone] (team pager from 8a-5p)

## 2021-02-11 NOTE — Progress Notes (Signed)
Progress Note   Patient: Nancy [name] DDU:[id number] DOB: 07-Sep-1965 DOA: 02/07/2021     3 DOS: the patient was seen and examined on 02/11/2021   Brief hospital course:  56 year old Caucasian female community dwelling Prior cellulitis dating back to 03/2015-group B strep--left second toe osteomyelitis status post amputation 05/2011 Prior R TKR 2013  Patient follows with Dr. Gwyneth Revels D.P.M.--last seen in this office 01/31/2021 and noted to have posterior left heel ulcer, 3X2X 1 cm-was debrided to 3X2X 1.5 cm and deep fibrotic nonviable and necrotic tissue was removed  In addition patient was seen by Dr. Barbara Cower dew on 1/24 with nonhealing ulceration of left heel-ABIs showed 1.1 bilaterally with diminished waveforms on the right--it was felt that patient may have tibial disease and angiogram was contemplated  Patient called the office on 1/26 and was found to have fever 103.3 as well as severe pain-patient was told to present to the emergency room  Underwent full-thickness excisional debridement calcium sulfate bead impregnation Dr. Ether Griffins 02/08/2021 Patient currently getting supportive management-ID consulted with regards to duration antibiotics   Assessment and Plan: Diabetic foot ulcer (HCC)- (present on admission)--does have exposed calcaneus Enterococcus fecalis on growth from WC 02/08/2021--resultant osteomyelitis Underwent debridement -Dr. Ether Griffins -full-thickness excisional debridement arthrotomy and I&D septic left ankle antibiotic impregnated CaSo4 beads Reluctant to consider BKA--defer consulting vascular surgery  ID input appreciated - ceftriaxone/Flagyl-->02/09/2021 to Unasyn-likely completing 6 weeks on 03/20/2021 PT OT has been consulted and patient will be nonweightbearing on that he will to assist with healing Pain is controlled on Dilaudid 2 mg every 3 as needed with breakthrough morphine 2 mg IV Q2 as needed-we have discussed that she will probably need to de-escalate off of  these postprocedural It appears that another procedure may be done on 2/6 by Dr. Ether Griffins? Wound VAC changes 3 times next week-can use [employer] wet-to-dry for dressings Nausea, vomiting 02/09/2021 DDx gastroparesis?-Abdomen is benign to exam It has resolved sepsis secondary to diabetic foot infection- (present on admission) Pt meets sepsis criteria from admission Diabetes mellitus, type 2 (HCC) Uncontrolled-A1c 12.4 indicating hyperglycemia predominantly increase long-acting insulin 10 -->20 units daily Had discontinued metformin after salmonellosis in the summer 2023 Because of side effects we will cutback metformin to 500 twice daily \ Hyponatremia- (present on admission) Saline locked IV 2/3 Improved Tobacco abuse- (present on admission) Nicotine patch. Essential hypertension- (present on admission) cont Coreg 3.125 twice daily meals, hydralazine 10 every 6 as needed pressure >160  Long discussion at the bedside with patient's husband significant other Nancy [name] [phone] on 02/11/2021     Subjective:   Several bouts of diarrhea-declining laxative Tells me she had Salmonella in the summer and was taken off of her metformin completely She was taking the metformin thousand 500 mg at night only until recently We have cut back the dose to 500 twice daily She expresses concerns about the foot wonders when podiatrist will determine if she needs any further debridement Patient's partner was at the bedside we had a frank discussion about the pros and cons of BKA versus limb salvage debridement-I have asked her to consider options carefully and discussed this with her podiatrist-she seems still unwilling to consider BKA  Physical Exam: Vitals:   02/10/21 1948 02/11/21 0753 02/11/21 1116 02/11/21 1530  BP: 133/73 (!) 143/66 134/78 (!) 152/75  Pulse: 82 73 66 83  Resp: 19 18 18 18   Temp: 98.5 F (36.9 C) 98.1 F (36.7 C) 98 F (36.7 C) 98.2 F (36.8 C)  TempSrc:  Oral     SpO2:  98% 98% 97% 96%  Weight:      Height:       Obese white female in no distress Thick neck Mallampati 4 S1-S2 no murmur no rub no gallop Abdomen soft no rebound  Chest is clear no rales no rhonchi Left lower extremity is wrapped in Coban with reinforce dressing at heel wound not reviewed today  Data Reviewed:  WBC down to 8.8 Some thrombocytosis likely reactive secondary to infection Sugars remain high despite increase in insulin recently-her albumin is low   Family Communication: Discussed with patient alone disposition: Status is: Inpatient Remains inpatient appropriate because: needs BKA?   Planned Discharge Destination:  Unclear at this time will need to monitor the wound and ensure healing continues to occur --defer this to Dr. Ether Griffins    Time spent: 24 minutes  Author: Rhetta Mura, MD 02/11/2021 4:15 PM  For on call review [email or web address].

## 2021-02-12 ENCOUNTER — Inpatient Hospital Stay: Payer: 59

## 2021-02-12 DIAGNOSIS — L089 Local infection of the skin and subcutaneous tissue, unspecified: Secondary | ICD-10-CM | POA: Diagnosis not present

## 2021-02-12 DIAGNOSIS — E1169 Type 2 diabetes mellitus with other specified complication: Secondary | ICD-10-CM | POA: Diagnosis not present

## 2021-02-12 DIAGNOSIS — M86172 Other acute osteomyelitis, left ankle and foot: Secondary | ICD-10-CM | POA: Diagnosis not present

## 2021-02-12 DIAGNOSIS — L97429 Non-pressure chronic ulcer of left heel and midfoot with unspecified severity: Secondary | ICD-10-CM | POA: Diagnosis not present

## 2021-02-12 DIAGNOSIS — E11628 Type 2 diabetes mellitus with other skin complications: Secondary | ICD-10-CM | POA: Diagnosis not present

## 2021-02-12 DIAGNOSIS — E11621 Type 2 diabetes mellitus with foot ulcer: Secondary | ICD-10-CM | POA: Diagnosis not present

## 2021-02-12 LAB — GLUCOSE, CAPILLARY
Glucose-Capillary: 189 mg/dL — ABNORMAL HIGH (ref 70–99)
Glucose-Capillary: 222 mg/dL — ABNORMAL HIGH (ref 70–99)
Glucose-Capillary: 223 mg/dL — ABNORMAL HIGH (ref 70–99)
Glucose-Capillary: 243 mg/dL — ABNORMAL HIGH (ref 70–99)

## 2021-02-12 LAB — CULTURE, BLOOD (ROUTINE X 2)
Culture: NO GROWTH
Culture: NO GROWTH
Special Requests: ADEQUATE

## 2021-02-12 LAB — AEROBIC/ANAEROBIC CULTURE W GRAM STAIN (SURGICAL/DEEP WOUND)

## 2021-02-12 MED ORDER — INSULIN ASPART 100 UNIT/ML IJ SOLN
3.0000 [IU] | Freq: Three times a day (TID) | INTRAMUSCULAR | Status: DC
  Administered 2021-02-13 – 2021-02-15 (×7): 3 [IU] via SUBCUTANEOUS
  Filled 2021-02-12 (×7): qty 1

## 2021-02-12 MED ORDER — INSULIN ASPART 100 UNIT/ML IJ SOLN
0.0000 [IU] | Freq: Three times a day (TID) | INTRAMUSCULAR | Status: DC
  Administered 2021-02-13: 5 [IU] via SUBCUTANEOUS
  Administered 2021-02-13: 3 [IU] via SUBCUTANEOUS
  Administered 2021-02-13 – 2021-02-14 (×2): 5 [IU] via SUBCUTANEOUS
  Filled 2021-02-12 (×4): qty 1

## 2021-02-12 NOTE — Progress Notes (Signed)
Progress Note   Patient: Nancy [name] CXK:[id number] DOB: [birth year] DOA: 02/07/2021     4 DOS: the patient was seen and examined on 02/12/2021   Brief hospital course:  56 year old Caucasian female community dwelling Prior cellulitis dating back to 03/2015-group B strep--left second toe osteomyelitis status post amputation 05/2011 Prior R TKR 2013  Patient follows with Dr. Gwyneth Revels D.P.M.--last seen in this office 01/31/2021 and noted to have posterior left heel ulcer, 3X2X 1 cm-was debrided to 3X2X 1.5 cm and deep fibrotic nonviable and necrotic tissue was removed  In addition patient was seen by Dr. Barbara Cower dew on 1/24 with nonhealing ulceration of left heel-ABIs showed 1.1 bilaterally with diminished waveforms on the right--it was felt that patient may have tibial disease and angiogram was contemplated  Patient called the office on 1/26 and was found to have fever 103.3 as well as severe pain-patient was told to present to the emergency room  Underwent full-thickness excisional debridement calcium sulfate bead impregnation Dr. Ether Griffins 02/08/2021 Patient currently getting supportive management-ID consulted with regards to duration antibiotics and have finalized their recommendations Has been seen several times subsequently by podiatry NS at bedside debridement-May benefit from further operative management for left limb salvage as per them  Assessment and Plan: Diabetic foot ulcer (HCC)- (present on admission)--does have exposed calcaneus Enterococcus fecalis on growth from WC 02/08/2021--resultant osteomyelitis Underwent debridement -Dr. Ether Griffins -full-thickness excisional debridement arthrotomy and I&D septic left ankle antibiotic impregnated CaSo4 beads Reluctant to consider BKA--defer consulting vascular surgery  ID input appreciated - ceftriaxone/Flagyl-->02/09/2021 to Unasyn-likely completing 6 weeks on 03/20/2021 PT OT has been consulted and patient will be nonweightbearing on that he  will to assist with healing Pain is controlled on Dilaudid 2 mg every 3 as needed with breakthrough morphine 2 mg IV Q2 as needed-we have discussed that she will probably need to de-escalate off of these postprocedural further debridement or surgical options wound care and wound VAC as per podiatry Nausea, vomiting 02/09/2021 DDx gastroparesis?-Abdomen is benign to exam It has resolved sepsis secondary to diabetic foot infection- (present on admission) Pt meets sepsis criteria from admission Diabetes mellitus, type 2 (HCC) Uncontrolled-A1c 12.4 indicating hyperglycemia predominantly increase long-acting insulin 10 -->20 units daily Had discontinued metformin after salmonellosis in the summer 2023 Metformin discontinued completely 2/62/2 ongoing diarrhea Hyponatremia- (present on admission) Saline locked IV 2/3 Improved Tobacco abuse- (present on admission) Nicotine patch. Essential hypertension- (present on admission) cont Coreg 3.125 twice daily meals, hydralazine 10 every 6 as needed pressure >160 Cont lisinopril 5 bid  Long discussion at the bedside with patient's husband significant other [name] [phone] on 02/11/2021-no family is present today-     Subjective:   Pain is moderate Patient seems somewhat frustrated-wound VAC is not fitting it was not the dressing issues apparently Her pain seems better controlled overall she is eating about 50% of her meals She tells me she may be going for an MRI later today and subsequently operative management in OR tomorrow?  Physical Exam: Vitals:   02/12/21 0351 02/12/21 0743 02/12/21 0805 02/12/21 1129  BP: (!) 160/84 (!) 146/70 (!) 161/77 (!) 152/78  Pulse: 71 68 79 71  Resp: 18 14 16 16   Temp: 98.3 F (36.8 C) 98.5 F (36.9 C) 97.9 F (36.6 C) 98.8 F (37.1 C)  TempSrc: Oral Oral  Oral  SpO2: 98% 93% 99% 98%  Weight:      Height:       Obese white female in no distress Thick neck  Chest clear no rales  rhonchi Left lower extremity not examined Abdomen soft no rebound  Data Reviewed:  No labs this morning  Family Communication: Discussed with patient alone disposition: Status is: Inpatient Remains inpatient appropriate because: needs BKA?   Planned Discharge Destination:  Unclear at this time will need to monitor the wound and ensure healing continues to occur --defer this to Dr. Ether Griffins    Time spent: 22 minutes  Author: Rhetta Mura, MD 02/12/2021 1:56 PM  For on call review [email or web address].

## 2021-02-12 NOTE — Progress Notes (Signed)
Daily Progress Note   Subjective  - 4 Days Post-Op  F/u left heel debridement.  Patient has tolerated the procedure well so far and is having mild pain to the posterior aspect of the back of the heel.  Patient stayed off of her foot.  Patient denies nausea, vomiting, fevers, chills at this time.  Objective Vitals:   02/12/21 0351 02/12/21 0743 02/12/21 0805 02/12/21 1129  BP: (!) 160/84 (!) 146/70 (!) 161/77 (!) 152/78  Pulse: 71 68 79 71  Resp: 18 14 16 16   Temp: 98.3 F (36.8 C) 98.5 F (36.9 C) 97.9 F (36.6 C) 98.8 F (37.1 C)  TempSrc: Oral Oral  Oral  SpO2: 98% 93% 99% 98%  Weight:      Height:        Physical Exam: At this point the posterior heel is stable.  No further purulence at this time.  The bones exposed to the entire posterior superior aspect of the calcaneus and posterior ankle.  Some fibrotic tissue residual, small areas of granularity.  Scant areas of necrosis also seen.  Improvement in redness and swelling but still appears to be inflamed.  Wound measures 7 cm x 5 cm x 2 cm.  At the area of the proximal margin where the sutures were placed did appear to have some dusky/dark/necrotic changes to the Achilles tendon area.  Predebridement   Postdebridement    Laboratory CBC    Component Value Date/Time   WBC 8.8 02/11/2021 0414   HGB 10.1 (L) 02/11/2021 0414   HGB 14.8 11/04/2016 1109   HCT 31.6 (L) 02/11/2021 0414   HCT 46.1 11/04/2016 1109   PLT 493 (H) 02/11/2021 0414   PLT 265 11/04/2016 1109    BMET    Component Value Date/Time   NA 135 02/11/2021 0414   NA 134 11/04/2016 1109   NA 133 (L) 08/21/2012 1523   K 3.6 02/11/2021 0414   K 4.1 08/21/2012 1523   CL 99 02/11/2021 0414   CL 102 08/21/2012 1523   CO2 28 02/11/2021 0414   CO2 26 08/21/2012 1523   GLUCOSE 255 (H) 02/11/2021 0414   GLUCOSE 306 (H) 08/21/2012 1523   BUN 11 02/11/2021 0414   BUN 16 11/04/2016 1109   BUN 17 08/21/2012 1523   CREATININE 0.62 02/11/2021 0414    CREATININE 0.69 08/21/2012 1523   CALCIUM 8.5 (L) 02/11/2021 0414   CALCIUM 8.8 08/21/2012 1523   GFRNONAA >60 02/11/2021 0414   GFRNONAA >60 08/21/2012 1523   GFRAA 117 11/04/2016 1109   GFRAA >60 08/21/2012 1523    Assessment/Planning: Status post debridement of posterior heel decubitus ulcer with pathological calcaneal fracture and septic ankle arthritis Diabetes with neuropathy, uncontrolled Osteomyelitis of calcaneus - enterococcus  At this point the foot is stable postoperative day #4.  We will have to continue to monitor for improvement or regression of the wound at this time.  There is a large amount of bone exposed to the posterior calcaneus at this time. Bedside further wound debridement was performed with 15 blade without incident.  Patient tolerated well.  Removed nonviable necrotic tissue present throughout the entirety of the wound, 100% excisional to bone.  Postdebridement measurement same as predebridement 7 cm x 5 cm x 2 cm. Discussed amputation but at this point she would like to try any and all options prior to amputation.  In agreement with Dr. 08/23/2012, the only attempts at limb salvage would be attempt to granulation getting the infection under  control with possible skin graft placement eventually and possible flap to the posterior aspect of the heel via plastic surgery. Would recommend taking patient back to the operating room for further debridement prior to discharge to remove a significant portion of the calcaneus.  Ordering an MRI prior to this of the left ankle without contrast to assess extent of infection.  Will defer to Dr. Ether Griffins for any further surgical intervention. I discussed this would likely take months/years to heal and it may never heal. According to path report and intraop culture patient does have osteomyelitis of the calcaneus - enterococcus.  Appreciate ID recommendations.  Will likely need 6w IV Abx and prolonged Abx potentially thereafter. Dr. Ether Griffins  will reach out to plastic surgery if they are willing to evaluate for possible flap in the future as outpatient. Wound VAC to be applied today by nursing.  Recommend changes 3x weekly.    Rosetta Posner, DPM  02/12/2021, 12:41 PM

## 2021-02-12 NOTE — Progress Notes (Signed)
Inpatient Diabetes Program Recommendations  AACE/ADA: New Consensus Statement on Inpatient Glycemic Control   Target Ranges:  Prepandial:   less than 140 mg/dL      Peak postprandial:   less than 180 mg/dL (1-2 hours)      Critically ill patients:  140 - 180 mg/dL    Latest Reference Range & Units 02/11/21 07:21 02/11/21 11:14 02/11/21 15:28 02/11/21 21:48  Glucose-Capillary 70 - 99 mg/dL 704 (H)  Novolog 2 units  Semglee 15 units  Metformin 500 mg 257 (H)  Novolog 3 units 240 (H)  Novolog 2 units 267 (H)   Review of Glycemic Control  Diabetes history: DM2 Outpatient Diabetes medications: Metformin 1500 mg AM, Metformin 1000 mg QPM, Mounjaro 5 mg Qweek, Novolin 70/30 70 units BID Current orders for Inpatient glycemic control: Semglee 20 units daily, Metformin 500 mg BID, Novolog 0-6 units TID with meals  Inpatient Diabetes Program Recommendations:    Insulin: Noted Semglee increased from 15 to 20 units daily. Please consider adding Novolog 0-5 units QHS and ordering Novolog 4 units TID with meals for meal coverage if patient eats at least 50% of meals.  Thanks, Orlando Penner, RN, MSN, CDE Diabetes Coordinator Inpatient Diabetes Program [phone] (Team Pager from 8am to 5pm)

## 2021-02-12 NOTE — Consult Note (Signed)
[facility] Nurse Consult Note: Reason for Consult:replace NPWT to the left heel Large surgical wound left heel; several OR debridements  MRI planned for today; may go back to OR tomorrow per patient Wound type: surgical  Pressure Injury POA: NA Measurement: 9cm x 5cm x 1.5cm  Wound bed: large amount of calcaneous bone exposed > 50% of wound bed, other tissue clean, pink and moist Drainage (amount, consistency, odor) minimal, serosanguinous  Periwound:intact  Dressing procedure/placement/frequency: Lined wound edges with ostomy strips (barrier), cut to fit one pc of black foam. Sealed with VAC drape and suction applied, sealed down but machine continued to read leak, would not even go to green seal at all despite the dressing being "sucked down" appropriately. Reinforced wound VAC drape edges. Apply skin prep over drape. No seal obtained  Unclamped machine from patient and clamped canister side off, will not obtain seal this way either. Machine malfunction.  Requested new NPWT machine. Canister left in room for bedside nurse. Dressing intact, clamped. Bolster of gauze used under tubing to prevent tubing from lying on patient's leg. Wrapped with ACE wrap. Prevalon boot reapplied. Explained situation to patient, she is very grateful for my help.   [employer] to MD and to RN to place new machine when it arrived to the unit.  Requested unit secretary to order new machine, same problem for Dr. Luana Shu over the weekend.  Orders updated in case seal can not be achieved with updated machine as well.   Will follow up in the AM on needs for VAC. Instructed patient to bring home unit in to be charged, it is still in box from delivery. Patient's CG will bring in tomorrow.   [place], [place], [place]

## 2021-02-12 NOTE — Progress Notes (Signed)
Date of Admission:  02/07/2021     ID: Nancy [name] is a 56 y.o. female  Principal Problem:   Diabetic foot ulcer (HCC) Active Problems:   Essential hypertension   Diabetes mellitus, type 2 (HCC)   Sepsis secondary to diabetic foot infection   Tobacco abuse   Hyponatremia    Subjective: Patient doing fine No fever Tolerating Unasyn very well Has wound VAC which was placed this morning on the left heel.  Medications:   acidophilus  1 capsule Oral Daily   vitamin C  500 mg Oral BID   carvedilol  3.125 mg Oral BID WC   docusate sodium  100 mg Oral BID   enoxaparin (LOVENOX) injection  0.5 mg/kg Subcutaneous Q24H   fenofibrate  54 mg Oral Daily   [START ON 02/13/2021] insulin aspart  0-15 Units Subcutaneous TID WC   [START ON 02/13/2021] insulin aspart  3 Units Subcutaneous TID WC   insulin glargine-yfgn  20 Units Subcutaneous Daily   lisinopril  5 mg Oral BID   montelukast  10 mg Oral QHS   multivitamin with minerals  1 tablet Oral Daily   nicotine  14 mg Transdermal Daily   nutrition supplement (JUVEN)  1 packet Oral BID BM   ondansetron (ZOFRAN) IV  4 mg Intravenous Once   pantoprazole  40 mg Oral Daily   pravastatin  40 mg Oral QHS   Ensure Max Protein  11 oz Oral QHS   saccharomyces boulardii  250 mg Oral BID   venlafaxine XR  37.5 mg Oral Q breakfast   zinc sulfate  220 mg Oral Daily    Objective: Vital signs in last 24 hours: Temp:  [97.9 F (36.6 C)-99.1 F (37.3 C)] 98.2 F (36.8 C) (02/06 1524) Pulse Rate:  [68-83] 75 (02/06 1524) Resp:  [14-18] 16 (02/06 1524) BP: (146-165)/(70-84) 165/78 (02/06 1524) SpO2:  [93 %-99 %] 98 % (02/06 1524)  PHYSICAL EXAM:  General: Alert, cooperative, no distress, appears stated age.  Head: Normocephalic, without obvious abnormality, atraumatic. Lungs: Clear to auscultation bilaterally. No Wheezing or Rhonchi. No rales. Heart: Regular rate and rhythm, no murmur, rub or gallop. Abdomen: Soft, non-tender,not  distended. Bowel sounds normal. No masses Extremities: Left foot has wound VAC on the heel Skin: No rashes or lesions. Or bruising Lymph: Cervical, supraclavicular normal. Neurologic: Grossly non-focal  Lab Results Recent Labs    02/10/21 0408 02/11/21 0414  WBC  --  8.8  HGB  --  10.1*  HCT  --  31.6*  NA  --  135  K  --  3.6  CL  --  99  CO2  --  28  BUN  --  11  CREATININE 0.65 0.62   Liver Panel Recent Labs    02/11/21 0414  PROT 5.7*  ALBUMIN 1.8*  AST 10*  ALT 11  ALKPHOS 57  BILITOT 0.4    Microbiology: Left heel wound culture Enterococcus faecalis Staph epidermidis Studies/Results: MR ANKLE LEFT WO CONTRAST  Result Date: 02/12/2021 CLINICAL DATA:  Osteomyelitis suspected. EXAM: MRI OF THE LEFT ANKLE WITHOUT CONTRAST TECHNIQUE: Multiplanar, multisequence MR imaging of the ankle was performed. No intravenous contrast was administered. COMPARISON:  Radiographs dated February 07, 2021 FINDINGS: TENDONS Peroneal: Peroneal longus tendon intact. Peroneal brevis intact. Posteromedial: Posterior tibial tendon intact. Flexor hallucis longus tendon intact. Flexor digitorum longus tendon intact. Anterior: Tibialis anterior tendon intact. Extensor hallucis longus tendon intact Extensor digitorum longus tendon intact. Achilles: There is avulsion fracture  about the insertion of the Achilles with small avulsed, osseous fragment above the remaining calcaneus. There is large adjacent deep skin wound. Plantar Fascia: Intact. LIGAMENTS Lateral: Anterior talofibular ligament intact. Calcaneofibular ligament intact. Posterior talofibular ligament intact. Anterior and posterior tibiofibular ligaments intact. Medial: Chronic degeneration of the deltoid ligament. Chronic degenerative tear of the spring ligament. CARTILAGE Ankle Joint: No joint effusion. Normal ankle mortise. No chondral defect. Subtalar Joints/Sinus Tarsi: Subtalar joint intact. No joint effusion. Bones: Marrow edema of the  posterior aspect of the calcaneus with a pathological avulsion fracture of the Achilles. Partial subluxations of the talus at the talonavicular joint, likely sequela of chronic ligamentous injury/degeneration in the settings of Charcot arthropathy. There is also subchondral edema at the calcaneocuboid joint. Generalized heterogeneous marrow of the distal tibia and fibula as well as multiple tarsal bones. Soft Tissue: No fluid collection or hematoma. Muscles are normal without edema or atrophy. Tarsal tunnel is normal. IMPRESSION: 1. Osteomyelitis of the posterior aspect of the calcaneus with avulsion fracture about the insertion of the Achilles tendon. Large skin defect about the posterior aspect of the calcaneus. 2. Partial subluxation at the talonavicular joint, likely sequela of chronic ligamentous injury and/or Charcot arthropathy. 3. Generalized heterogeneous marrow of the calcaneus as well as degenerative changes at the talonavicular and calcaneocuboid joints. 4. Generalized subcutaneous soft tissue edema with myopathy/myositis of the plantar muscles. 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 No drainable fluid collection or abscess. Electronically Signed   By: Larose Hires D.O.   On: 02/12/2021 17:29     Assessment/Plan: Diabetic foot infection with necrotic wound with osteomyelitis of the left heel.  Status post debridement and partial excision of the calcaneal bone.  The patient has exposed calcaneus.  She is gotten wound VAC currently.  Wound culture had Enterococcus faecalis.  Staph epi is a contaminant.  Patient currently on Unasyn and vancomycin.  Will discontinue Vanco.  Will need 6 weeks of IV Unasyn  Diabetes mellitus on insulin and metformin  Penicillin allergy as a child but has tolerated Unasyn very well  Discussed the management with the patient and the care team.

## 2021-02-13 DIAGNOSIS — E11621 Type 2 diabetes mellitus with foot ulcer: Secondary | ICD-10-CM | POA: Diagnosis not present

## 2021-02-13 DIAGNOSIS — L97429 Non-pressure chronic ulcer of left heel and midfoot with unspecified severity: Secondary | ICD-10-CM | POA: Diagnosis not present

## 2021-02-13 LAB — CBC WITH DIFFERENTIAL/PLATELET
Abs Immature Granulocytes: 0.13 10*3/uL — ABNORMAL HIGH (ref 0.00–0.07)
Basophils Absolute: 0.1 10*3/uL (ref 0.0–0.1)
Basophils Relative: 1 %
Eosinophils Absolute: 0.4 10*3/uL (ref 0.0–0.5)
Eosinophils Relative: 4 %
HCT: 31.4 % — ABNORMAL LOW (ref 36.0–46.0)
Hemoglobin: 10 g/dL — ABNORMAL LOW (ref 12.0–15.0)
Immature Granulocytes: 1 %
Lymphocytes Relative: 21 %
Lymphs Abs: 2 10*3/uL (ref 0.7–4.0)
MCH: 29.2 pg (ref 26.0–34.0)
MCHC: 31.8 g/dL (ref 30.0–36.0)
MCV: 91.5 fL (ref 80.0–100.0)
Monocytes Absolute: 0.9 10*3/uL (ref 0.1–1.0)
Monocytes Relative: 9 %
Neutro Abs: 6.3 10*3/uL (ref 1.7–7.7)
Neutrophils Relative %: 64 %
Platelets: 572 10*3/uL — ABNORMAL HIGH (ref 150–400)
RBC: 3.43 MIL/uL — ABNORMAL LOW (ref 3.87–5.11)
RDW: 12.7 % (ref 11.5–15.5)
WBC: 9.9 10*3/uL (ref 4.0–10.5)
nRBC: 0 % (ref 0.0–0.2)

## 2021-02-13 LAB — BASIC METABOLIC PANEL
Anion gap: 8 (ref 5–15)
BUN: 12 mg/dL (ref 6–20)
CO2: 29 mmol/L (ref 22–32)
Calcium: 8.5 mg/dL — ABNORMAL LOW (ref 8.9–10.3)
Chloride: 98 mmol/L (ref 98–111)
Creatinine, Ser: 0.65 mg/dL (ref 0.44–1.00)
GFR, Estimated: >60 mL/min (ref >60)
Glucose, Bld: 246 mg/dL — ABNORMAL HIGH (ref 70–99)
Potassium: 3.8 mmol/L (ref 3.5–5.1)
Sodium: 135 mmol/L (ref 135–145)

## 2021-02-13 LAB — GLUCOSE, CAPILLARY
Glucose-Capillary: 191 mg/dL — ABNORMAL HIGH (ref 70–99)
Glucose-Capillary: 206 mg/dL — ABNORMAL HIGH (ref 70–99)
Glucose-Capillary: 235 mg/dL — ABNORMAL HIGH (ref 70–99)
Glucose-Capillary: 211 mg/dL — ABNORMAL HIGH (ref 70–99)

## 2021-02-13 LAB — SURGICAL PCR SCREEN
MRSA, PCR: NEGATIVE
Staphylococcus aureus: NEGATIVE

## 2021-02-13 MED ORDER — INSULIN GLARGINE-YFGN 100 UNIT/ML ~~LOC~~ SOLN
25.0000 [IU] | Freq: Every day | SUBCUTANEOUS | Status: DC
  Administered 2021-02-14 – 2021-02-18 (×5): 25 [IU] via SUBCUTANEOUS
  Filled 2021-02-13 (×5): qty 0.25

## 2021-02-13 NOTE — Progress Notes (Addendum)
°  Progress Note   Patient: Nancy [name] YBW:[id number] DOB: January 31, 1965 DOA: 02/07/2021     5 DOS: the patient was seen and examined on 02/13/2021   Brief hospital course:  56 year old Caucasian female community dwelling Prior cellulitis dating back to 03/2015-group B strep--left second toe osteomyelitis status post amputation 05/2011 Prior R TKR 2013  Patient follows with Dr. Gwyneth Revels D.P.M.--last seen in this office 01/31/2021 and noted to have posterior left heel ulcer, 3X2X 1 cm-was debrided to 3X2X 1.5 cm and deep fibrotic nonviable and necrotic tissue was removed  In addition patient was seen by Dr. Barbara Cower dew on 1/24 with nonhealing ulceration of left heel-ABIs showed 1.1 bilaterally with diminished waveforms on the right--it was felt that patient may have tibial disease and angiogram was contemplated  Patient called the office on 1/26 and was found to have fever 103.3 as well as severe pain-patient was told to present to the emergency room  Underwent full-thickness excisional debridement calcium sulfate bead impregnation Dr. Ether Griffins 02/08/2021 Patient currently getting supportive management-ID consulted with regards to duration antibiotics and have finalized their recommendations Has been seen several times subsequently by podiatry NS at bedside debridement-May benefit from further operative management for left limb salvage as per them  Assessment and Plan: Diabetic foot ulcer (HCC)- (present on admission)--does have exposed calcaneus Enterococcus fecalis on growth from WC 02/08/2021--resultant osteomyelitis Underwent debridement -Dr. Ether Griffins -full-thickness excisional debridement arthrotomy and I&D septic left ankle antibiotic impregnated CaSo4 beads Reluctant to consider BKA- ceftriaxone/Flagyl-->02/09/2021 to Unasyn [ 6 weeks] on 03/20/2021 Pain is controlled on Dilaudid 2 mg every 3 as needed with breakthrough morphine 2 mg IV Q2 as needed Slow de-escalate off postprocedural Surgery  02/13/21 per Dr Eliezer Champagne surgery to see Nausea, vomiting 02/09/2021 DDx gastroparesis?-Abdomen is benign to exam It has resolved sepsis secondary to diabetic foot infection- (present on admission) Pt meets sepsis criteria from admission Diabetes mellitus, type 2 (HCC) Uncontrolled-A1c 12.4 --sugars better ctrlld on long-acting insulin 20 units daily Had discontinued metformin after salmonellosis in the summer 2023 Metformin discontinued completely 2/62/2 ongoing diarrhea Hyponatremia- (present on admission) Saline locked IV 2/3 Improved Tobacco abuse- (present on admission) Nicotine patch. Essential hypertension- (present on admission) cont Coreg 3.125 twice daily meals, hydralazine 10 every 6 as needed pressure >160 Cont lisinopril 5 bid  Long discussion at the bedside with patient's husband significant other [name] [phone]  Subjective:   Well Pain controlled moderately No fever chills Podiatry note reviewed   Physical Exam: Vitals:   02/13/21 0404 02/13/21 0755 02/13/21 1030 02/13/21 1122  BP: 137/78 139/89  139/61  Pulse: 72 74 78 70  Resp: 17 16 17 16   Temp: 98.9 F (37.2 C) 98.7 F (37.1 C)  98.8 F (37.1 C)  TempSrc:      SpO2: 96% 100%  96%  Weight:      Height:       Data Reviewed:  Wbc normalized, bun/creat stable  Family Communication: Discussed with patient alone disposition: Status is: Inpatient Remains inpatient appropriate because: needs BKA?   Planned Discharge Destination:  Unclear at this time --defer this to Dr.    Time spent: 12 minutes  Author: Ether Griffins, MD 02/13/2021 2:33 PM  For on call review [email or web address].

## 2021-02-13 NOTE — Progress Notes (Signed)
Daily Progress Note   Subjective  - 5 Days Post-Op  Left heel ulceration with osteomyelitis.  She is on IV antibiotics.  She currently has a wound VAC in place.  The seal has maintained.  Has been evaluated by Dr. Excell Seltzer over the weekend.  He noted some residual areas of fibrotic tissue and likely need for further debridement going forward  Objective Vitals:   02/13/21 0404 02/13/21 0755 02/13/21 1030 02/13/21 1122  BP: 137/78 139/89  139/61  Pulse: 72 74 78 70  Resp: 17 16 17 16   Temp: 98.9 F (37.2 C) 98.7 F (37.1 C)  98.8 F (37.1 C)  TempSrc:      SpO2: 96% 100%  96%  Weight:      Height:        Physical Exam: Wound VAC intact.  Mild surrounding erythema as expected.  Mild maceration.  Per previous note fibrotic tissue with areas of superficial necrosis of bone of the superior aspect of the calcaneus.  In need of further debridement.  Laboratory CBC    Component Value Date/Time   WBC 9.9 02/13/2021 0323   HGB 10.0 (L) 02/13/2021 0323   HGB 14.8 11/04/2016 1109   HCT 31.4 (L) 02/13/2021 0323   HCT 46.1 11/04/2016 1109   PLT 572 (H) 02/13/2021 0323   PLT 265 11/04/2016 1109    BMET    Component Value Date/Time   NA 135 02/13/2021 0323   NA 134 11/04/2016 1109   NA 133 (L) 08/21/2012 1523   K 3.8 02/13/2021 0323   K 4.1 08/21/2012 1523   CL 98 02/13/2021 0323   CL 102 08/21/2012 1523   CO2 29 02/13/2021 0323   CO2 26 08/21/2012 1523   GLUCOSE 246 (H) 02/13/2021 0323   GLUCOSE 306 (H) 08/21/2012 1523   BUN 12 02/13/2021 0323   BUN 16 11/04/2016 1109   BUN 17 08/21/2012 1523   CREATININE 0.65 02/13/2021 0323   CREATININE 0.69 08/21/2012 1523   CALCIUM 8.5 (L) 02/13/2021 0323   CALCIUM 8.8 08/21/2012 1523   GFRNONAA >60 02/13/2021 0323   GFRNONAA >60 08/21/2012 1523   GFRAA 117 11/04/2016 1109   GFRAA >60 08/21/2012 1523    Assessment/Planning: Osteomyelitis left heel with deep wound.  We will plan for debridement and washout tomorrow.  Plan for  placement of wound VAC postdebridement.  Likely she will need wound VAC placement for some time.  Orders placed for surgery tomorrow.  I discussed with patient today and consent was given verbally. Long-term she is going to need chronic wound VAC therapy and likely further modalities.  I have discussed the case with plastics in [place] and upon discharge we should be able to set her up for consultation for evaluation for possible flap closure at some point down the road. She will need long-term IV antibiotics for chronic osteomyelitis. We will perform a bone biopsy tomorrow for the residual calcaneus and evaluate for further infection in this area  [place] A  02/13/2021, 1:07 PM

## 2021-02-13 NOTE — Progress Notes (Signed)
Nutrition Follow-up  DOCUMENTATION CODES:   Obesity unspecified  INTERVENTION:   -Continue 1 packet Juven BID, each packet provides 95 calories, 2.5 grams of protein (collagen), and 9.8 grams of carbohydrate (3 grams sugar); also contains 7 grams of L-arginine and L-glutamine, 300 mg vitamin C, 15 mg vitamin E, 1.2 mcg vitamin B-12, 9.5 mg zinc, 200 mg calcium, and 1.5 g  Calcium Beta-hydroxy-Beta-methylbutyrate to support wound healing  -Continue MVI with minerals daily -Continue Ensure Max po daily, each supplement provides 150 kcal and 30 grams of protein -Continue 500 mg vitamin C BID -Continue 220 mg zinc sulfate daily   NUTRITION DIAGNOSIS:   Increased nutrient needs related to post-op healing as evidenced by estimated needs.  Ongoing  GOAL:   Patient will meet greater than or equal to 90% of their needs  Progressing   MONITOR:   PO intake, Supplement acceptance, Diet advancement, Labs, Weight trends, Skin, I & O's  REASON FOR ASSESSMENT:   Consult Wound healing  ASSESSMENT:   Nancy [name] is a 56 y.o. female with medical history significant of DM ii, asthma, cad, DFU. Hyperlipidemia presenting with DFU on the left ankle patient is scheduled to have surgery with podiatrist tomorrow.  Pain with weightbearing and 10 out of 10 severe nonradiating.  Podiatrist referred patient to the hospital.  2/2- s/p Procedure: 1.  Full-thickness excisional debridement and removal of posterior superior calcaneus left posterior heel 2.  Arthrotomy with I&D of septic ankle left foot 3.  Placement of antibiotic impregnated calcium sulfate beads  Reviewed I/O's: +519 ml x 24 hours and +8.8 L isnce admission  Per MD notes, plan doe debridement and washout tomorrow with wound vac placement. May also plan for bone biopsy.   Spoke with pt and wife at bedside. Pt reports her appetite has improved since admission. Noted meal completions 25-100%. Pt shares that her appetite improved  yesterday and was consuming about 50% of meals prior. Pt enjoys her nutritional supplements and has been consuming them.   Discussed importance of good meal and supplement intake to promote healing. Also discussed importance of good glycemic control to promote wound healing and how DM is managed in the hospital.   Medications reviewed and include vitamin D3 and colace.  Labs reviewed: CBGS: [id number] (inpatient orders for glycemic control are 0-15 units insulin aspart TID with meals,. 3 units insulin aspart TID with meals, and 25 units insulin glargine-yfgn daily).    NUTRITION - FOCUSED PHYSICAL EXAM:  Flowsheet Row Most Recent Value  Orbital Region No depletion  Upper Arm Region No depletion  Thoracic and Lumbar Region No depletion  Buccal Region No depletion  Temple Region No depletion  Clavicle Bone Region No depletion  Clavicle and Acromion Bone Region No depletion  Scapular Bone Region No depletion  Dorsal Hand No depletion  Patellar Region No depletion  Anterior Thigh Region No depletion  Posterior Calf Region No depletion  Edema (RD Assessment) Mild  Hair Reviewed  Eyes Reviewed  Mouth Reviewed  Skin Reviewed  Nails Reviewed       Diet Order:   Diet Order             Diet NPO time specified  Diet effective ____           Diet heart healthy/carb modified Room service appropriate? Yes; Fluid consistency: Thin  Diet effective now                   EDUCATION NEEDS:  No education needs have been identified at this time  Skin:  Skin Assessment: Skin Integrity Issues: Skin Integrity Issues:: Diabetic Ulcer Diabetic Ulcer: lt heel  Last BM:  02/12/21  Height:   Ht Readings from Last 1 Encounters:  02/08/21 5\' 9"  (1.753 m)    Weight:   Wt Readings from Last 1 Encounters:  02/08/21 109.3 kg    Ideal Body Weight:  65.9 kg  BMI:  Body mass index is 35.59 kg/m.  Estimated Nutritional Needs:   Kcal:  2100-2300  Protein:  115-130 grams  Fluid:  >  2.1 L    Loistine Chance, RD, LDN, [place] Registered Dietitian II Certified Diabetes Care and Education Specialist Please refer to [facility] for RD and/or RD on-call/weekend/after hours pager

## 2021-02-13 NOTE — Progress Notes (Signed)
Inpatient Diabetes Program Recommendations  AACE/ADA: New Consensus Statement on Inpatient Glycemic Control   Target Ranges:  Prepandial:   less than 140 mg/dL      Peak postprandial:   less than 180 mg/dL (1-2 hours)      Critically ill patients:  140 - 180 mg/dL    Latest Reference Range & Units 02/13/21 03:23  Glucose 70 - 99 mg/dL 480 (H)    Latest Reference Range & Units 02/12/21 08:06 02/12/21 11:33 02/12/21 17:01 02/12/21 20:57  Glucose-Capillary 70 - 99 mg/dL 165 (H) 537 (H) 482 (H) 243 (H)   Review of Glycemic Control  Diabetes history: DM2 Outpatient Diabetes medications: Metformin 1500 mg AM, Metformin 1000 mg QPM, Mounjaro 5 mg Qweek, Novolin 70/30 70 units BID Current orders for Inpatient glycemic control: Semglee 20 units daily, Novolog 0-15 units TID with meals, Novolog 3 units TID with meals   Inpatient Diabetes Program Recommendations:     Insulin: Please consider increasing Semglee to 25 units daily and adding Novolog 0-5 units QHS.  Thanks, Orlando Penner, RN, MSN, CDE Diabetes Coordinator Inpatient Diabetes Program [phone] (Team Pager from 8am to 5pm)

## 2021-02-14 ENCOUNTER — Other Ambulatory Visit: Payer: Self-pay

## 2021-02-14 ENCOUNTER — Inpatient Hospital Stay: Payer: Self-pay

## 2021-02-14 ENCOUNTER — Encounter: Payer: Self-pay | Admitting: Internal Medicine

## 2021-02-14 ENCOUNTER — Encounter
Admission: EM | Disposition: A | Discharge status: Home Health | Payer: Self-pay | Source: Home / Self Care | Attending: Family Medicine

## 2021-02-14 ENCOUNTER — Inpatient Hospital Stay: Payer: 59

## 2021-02-14 DIAGNOSIS — L97424 Non-pressure chronic ulcer of left heel and midfoot with necrosis of bone: Secondary | ICD-10-CM | POA: Diagnosis not present

## 2021-02-14 DIAGNOSIS — I10 Essential (primary) hypertension: Secondary | ICD-10-CM

## 2021-02-14 DIAGNOSIS — E11621 Type 2 diabetes mellitus with foot ulcer: Secondary | ICD-10-CM

## 2021-02-14 DIAGNOSIS — M86172 Other acute osteomyelitis, left ankle and foot: Secondary | ICD-10-CM | POA: Diagnosis not present

## 2021-02-14 DIAGNOSIS — E871 Hypo-osmolality and hyponatremia: Secondary | ICD-10-CM

## 2021-02-14 DIAGNOSIS — L97429 Non-pressure chronic ulcer of left heel and midfoot with unspecified severity: Secondary | ICD-10-CM

## 2021-02-14 HISTORY — PX: APPLICATION OF WOUND VAC: SHX5189

## 2021-02-14 HISTORY — PX: IRRIGATION AND DEBRIDEMENT FOOT: SHX6602

## 2021-02-14 LAB — GLUCOSE, CAPILLARY
Glucose-Capillary: 265 mg/dL — ABNORMAL HIGH (ref 70–99)
Glucose-Capillary: 188 mg/dL — ABNORMAL HIGH (ref 70–99)
Glucose-Capillary: 124 mg/dL — ABNORMAL HIGH (ref 70–99)
Glucose-Capillary: 100 mg/dL — ABNORMAL HIGH (ref 70–99)
Glucose-Capillary: 106 mg/dL — ABNORMAL HIGH (ref 70–99)
Glucose-Capillary: 181 mg/dL — ABNORMAL HIGH (ref 70–99)

## 2021-02-14 SURGERY — IRRIGATION AND DEBRIDEMENT FOOT
Anesthesia: General | Laterality: Left

## 2021-02-14 MED ORDER — INSULIN ASPART 100 UNIT/ML IJ SOLN
0.0000 [IU] | Freq: Every day | INTRAMUSCULAR | Status: DC
  Administered 2021-02-15 – 2021-02-17 (×2): 2 [IU] via SUBCUTANEOUS
  Filled 2021-02-14 (×2): qty 1

## 2021-02-14 MED ORDER — FENTANYL CITRATE (PF) 100 MCG/2ML IJ SOLN
INTRAMUSCULAR | Status: AC
  Filled 2021-02-14: qty 2

## 2021-02-14 MED ORDER — FENTANYL CITRATE (PF) 100 MCG/2ML IJ SOLN
INTRAMUSCULAR | Status: DC | PRN
  Administered 2021-02-14: 50 ug via INTRAVENOUS

## 2021-02-14 MED ORDER — FENTANYL CITRATE (PF) 100 MCG/2ML IJ SOLN
25.0000 ug | INTRAMUSCULAR | Status: DC | PRN
  Administered 2021-02-14 (×2): 25 ug via INTRAVENOUS

## 2021-02-14 MED ORDER — STERILE WATER FOR INJECTION IJ SOLN
INTRAMUSCULAR | Status: DC | PRN
  Administered 2021-02-14: 2 mL

## 2021-02-14 MED ORDER — MIDAZOLAM HCL 2 MG/2ML IJ SOLN
INTRAMUSCULAR | Status: DC | PRN
  Administered 2021-02-14: 2 mg via INTRAVENOUS

## 2021-02-14 MED ORDER — HEMOSTATIC AGENTS (NO CHARGE) OPTIME
TOPICAL | Status: DC | PRN
  Administered 2021-02-14: 1 via TOPICAL

## 2021-02-14 MED ORDER — PROPOFOL 1000 MG/100ML IV EMUL
INTRAVENOUS | Status: AC
  Filled 2021-02-14: qty 100

## 2021-02-14 MED ORDER — LIDOCAINE HCL (CARDIAC) PF 100 MG/5ML IV SOSY
PREFILLED_SYRINGE | INTRAVENOUS | Status: DC | PRN
  Administered 2021-02-14: 50 mg via INTRAVENOUS

## 2021-02-14 MED ORDER — 0.9 % SODIUM CHLORIDE (POUR BTL) OPTIME
TOPICAL | Status: DC | PRN
  Administered 2021-02-14: 1000 mL

## 2021-02-14 MED ORDER — SODIUM CHLORIDE 0.9 % IR SOLN
Status: DC | PRN
  Administered 2021-02-14: 3000 mL
  Administered 2021-02-14: 1000 mL

## 2021-02-14 MED ORDER — LIDOCAINE-EPINEPHRINE 1 %-1:100000 IJ SOLN
INTRAMUSCULAR | Status: AC
  Filled 2021-02-14: qty 1

## 2021-02-14 MED ORDER — MIDAZOLAM HCL 2 MG/2ML IJ SOLN
INTRAMUSCULAR | Status: AC
  Filled 2021-02-14: qty 2

## 2021-02-14 MED ORDER — KETAMINE HCL 50 MG/5ML IJ SOSY
PREFILLED_SYRINGE | INTRAMUSCULAR | Status: AC
  Filled 2021-02-14: qty 5

## 2021-02-14 MED ORDER — PROPOFOL 10 MG/ML IV BOLUS
INTRAVENOUS | Status: DC | PRN
Start: 2021-02-14 — End: 2021-02-14
  Administered 2021-02-14: 100 ug/kg/min via INTRAVENOUS

## 2021-02-14 MED ORDER — STERILE WATER FOR INJECTION IJ SOLN
INTRAMUSCULAR | Status: AC
  Filled 2021-02-14: qty 10

## 2021-02-14 MED ORDER — INSULIN ASPART 100 UNIT/ML IJ SOLN
0.0000 [IU] | Freq: Three times a day (TID) | INTRAMUSCULAR | Status: DC
  Administered 2021-02-14 – 2021-02-15 (×2): 4 [IU] via SUBCUTANEOUS
  Administered 2021-02-15 (×2): 7 [IU] via SUBCUTANEOUS
  Administered 2021-02-16: 4 [IU] via SUBCUTANEOUS
  Administered 2021-02-16: 7 [IU] via SUBCUTANEOUS
  Administered 2021-02-16: 11 [IU] via SUBCUTANEOUS
  Administered 2021-02-17: 7 [IU] via SUBCUTANEOUS
  Administered 2021-02-17 (×2): 11 [IU] via SUBCUTANEOUS
  Administered 2021-02-18: 7 [IU] via SUBCUTANEOUS
  Filled 2021-02-14 (×11): qty 1

## 2021-02-14 MED ORDER — ONDANSETRON HCL 4 MG/2ML IJ SOLN: 4.0000 mg | Freq: Once | INTRAMUSCULAR | Status: DC | PRN

## 2021-02-14 MED ORDER — BUPIVACAINE HCL (PF) 0.5 % IJ SOLN
INTRAMUSCULAR | Status: AC
  Filled 2021-02-14: qty 30

## 2021-02-14 MED ORDER — PHENYLEPHRINE HCL (PRESSORS) 10 MG/ML IV SOLN
INTRAVENOUS | Status: DC | PRN
  Administered 2021-02-14 (×3): 200 ug via INTRAVENOUS

## 2021-02-14 MED ORDER — ONDANSETRON HCL 4 MG/2ML IJ SOLN
INTRAMUSCULAR | Status: AC
  Filled 2021-02-14: qty 2

## 2021-02-14 MED ORDER — PHENYLEPHRINE HCL (PRESSORS) 10 MG/ML IV SOLN: INTRAVENOUS | Status: DC | PRN

## 2021-02-14 MED ORDER — FENTANYL CITRATE (PF) 100 MCG/2ML IJ SOLN
INTRAMUSCULAR | Status: AC
  Administered 2021-02-14: 25 ug via INTRAVENOUS
  Filled 2021-02-14: qty 2

## 2021-02-14 MED ORDER — LIDOCAINE HCL (PF) 1 % IJ SOLN
INTRAMUSCULAR | Status: AC
  Filled 2021-02-14: qty 30

## 2021-02-14 SURGICAL SUPPLY — 75 items
AGENT HMST KT MTR STRL THRMB (HEMOSTASIS) ×1
BLADE OSC/SAGITTAL MD 5.5X18 (BLADE) IMPLANT
BLADE OSCILLATING/SAGITTAL (BLADE)
BLADE SURG 15 STRL LF DISP TIS (BLADE) ×2 IMPLANT
BLADE SURG 15 STRL SS (BLADE) ×2
BLADE SW THK.38XMED LNG THN (BLADE) IMPLANT
BNDG COHESIVE 4X5 TAN ST LF (GAUZE/BANDAGES/DRESSINGS) ×3 IMPLANT
BNDG COHESIVE 6X5 TAN ST LF (GAUZE/BANDAGES/DRESSINGS) ×2 IMPLANT
BNDG CONFORM 2 STRL LF (GAUZE/BANDAGES/DRESSINGS) ×2 IMPLANT
BNDG CONFORM 3 STRL LF (GAUZE/BANDAGES/DRESSINGS) ×2 IMPLANT
BNDG ELASTIC 4X5.8 VLCR STR LF (GAUZE/BANDAGES/DRESSINGS) ×2 IMPLANT
BNDG ESMARK 4X12 TAN STRL LF (GAUZE/BANDAGES/DRESSINGS) ×3 IMPLANT
BNDG GAUZE ELAST 4 BULKY (GAUZE/BANDAGES/DRESSINGS) ×3 IMPLANT
CANISTER WOUND CARE 500ML ATS (WOUND CARE) ×3 IMPLANT
CNTNR SPEC 2.5X3XGRAD LEK (MISCELLANEOUS) ×1
CONT SPEC 4OZ STER OR WHT (MISCELLANEOUS) ×1
CONT SPEC 4OZ STRL OR WHT (MISCELLANEOUS) ×1
CONTAINER SPEC 2.5X3XGRAD LEK (MISCELLANEOUS) IMPLANT
CUFF TOURN SGL QUICK 12 (TOURNIQUET CUFF) IMPLANT
CUFF TOURN SGL QUICK 18X4 (TOURNIQUET CUFF) IMPLANT
DRAPE FLUOR MINI C-ARM 54X84 (DRAPES) IMPLANT
DRAPE XRAY CASSETTE 23X24 (DRAPES) IMPLANT
DRSG MEPILEX FLEX 3X3 (GAUZE/BANDAGES/DRESSINGS) IMPLANT
DRSG VAC ATS MED SENSATRAC (GAUZE/BANDAGES/DRESSINGS) IMPLANT
DURAPREP 26ML APPLICATOR (WOUND CARE) ×2 IMPLANT
ELECT REM PT RETURN 9FT ADLT (ELECTROSURGICAL) ×2
ELECTRODE REM PT RTRN 9FT ADLT (ELECTROSURGICAL) ×2 IMPLANT
GAUZE PACKING 1/4 X5 YD (GAUZE/BANDAGES/DRESSINGS) ×3 IMPLANT
GAUZE PACKING IODOFORM 1X5 (PACKING) ×3 IMPLANT
GAUZE SPONGE 4X4 12PLY STRL (GAUZE/BANDAGES/DRESSINGS) ×4 IMPLANT
GAUZE XEROFORM 1X8 LF (GAUZE/BANDAGES/DRESSINGS) ×2 IMPLANT
GLOVE SURG ENC MOIS LTX SZ7.5 (GLOVE) ×3 IMPLANT
GLOVE SURG UNDER LTX SZ8 (GLOVE) ×3 IMPLANT
GOWN STRL REUS W/ TWL XL LVL3 (GOWN DISPOSABLE) ×4 IMPLANT
GOWN STRL REUS W/TWL MED LVL3 (GOWN DISPOSABLE) ×6 IMPLANT
GOWN STRL REUS W/TWL XL LVL3 (GOWN DISPOSABLE) ×4
HANDPIECE VERSAJET DEBRIDEMENT (MISCELLANEOUS) ×1 IMPLANT
IV NS 1000ML (IV SOLUTION) ×2
IV NS 1000ML BAXH (IV SOLUTION) ×2 IMPLANT
IV NS IRRIG 3000ML ARTHROMATIC (IV SOLUTION) ×3 IMPLANT
KIT DRSG VAC SLVR GRANUFM (MISCELLANEOUS) ×3 IMPLANT
KIT TURNOVER KIT A (KITS) ×3 IMPLANT
LABEL OR SOLS (LABEL) ×2 IMPLANT
MANIFOLD NEPTUNE II (INSTRUMENTS) ×3 IMPLANT
NDL FILTER BLUNT 18X1 1/2 (NEEDLE) ×2 IMPLANT
NDL HYPO 25X1 1.5 SAFETY (NEEDLE) ×2 IMPLANT
NEEDLE FILTER BLUNT 18X 1/2SAF (NEEDLE)
NEEDLE FILTER BLUNT 18X1 1/2 (NEEDLE) IMPLANT
NEEDLE HYPO 25X1 1.5 SAFETY (NEEDLE) ×2 IMPLANT
NS IRRIG 1000ML POUR BTL (IV SOLUTION) ×1 IMPLANT
NS IRRIG 500ML POUR BTL (IV SOLUTION) ×3 IMPLANT
PACK EXTREMITY ARMC (MISCELLANEOUS) ×3 IMPLANT
PAD ABD DERMACEA PRESS 5X9 (GAUZE/BANDAGES/DRESSINGS) ×2 IMPLANT
PENCIL ELECTRO HAND CTR (MISCELLANEOUS) ×3 IMPLANT
PULSAVAC PLUS IRRIG FAN TIP (DISPOSABLE) ×2
PUNCH BIOPSY 4MM (MISCELLANEOUS) ×2
PUNCH BIOPSY DISP 4 (MISCELLANEOUS) IMPLANT
RASP SM TEAR CROSS CUT (RASP) IMPLANT
SHIELD FULL FACE ANTIFOG 7M (MISCELLANEOUS) ×3 IMPLANT
SOL PREP PVP 2OZ (MISCELLANEOUS) ×2
SOLUTION PREP PVP 2OZ (MISCELLANEOUS) ×2 IMPLANT
STOCKINETTE IMPERVIOUS 9X36 MD (GAUZE/BANDAGES/DRESSINGS) ×2 IMPLANT
SURGIFLO W/THROMBIN 8M KIT (HEMOSTASIS) ×1 IMPLANT
SUT ETHILON 2 0 FS 18 (SUTURE) ×6 IMPLANT
SUT ETHILON 4-0 (SUTURE)
SUT ETHILON 4-0 FS2 18XMFL BLK (SUTURE)
SUT VIC AB 3-0 SH 27 (SUTURE)
SUT VIC AB 3-0 SH 27X BRD (SUTURE) ×2 IMPLANT
SUT VIC AB 4-0 FS2 27 (SUTURE) ×2 IMPLANT
SUTURE ETHLN 4-0 FS2 18XMF BLK (SUTURE) ×2 IMPLANT
SWAB CULTURE AMIES ANAERIB BLU (MISCELLANEOUS) IMPLANT
SYR 10ML LL (SYRINGE) ×6 IMPLANT
SYR 3ML LL SCALE MARK (SYRINGE) ×2 IMPLANT
TIP FAN IRRIG PULSAVAC PLUS (DISPOSABLE) ×2 IMPLANT
WATER STERILE IRR 500ML POUR (IV SOLUTION) ×3 IMPLANT

## 2021-02-14 NOTE — Anesthesia Preprocedure Evaluation (Signed)
Anesthesia Evaluation  Patient identified by MRN, date of birth, ID band Patient awake    Reviewed: Allergy & Precautions, H&P , NPO status , Patient's Chart, lab work & pertinent test results, reviewed documented beta blocker date and time   History of Anesthesia Complications (+) PONV, Family history of anesthesia reaction and history of anesthetic complications  Airway Mallampati: III   Neck ROM: full    Dental  (+) Poor Dentition   Pulmonary neg pulmonary ROS, neg shortness of breath, asthma , pneumonia, resolved, former smoker,    Pulmonary exam normal        Cardiovascular Exercise Tolerance: Poor hypertension, On Medications + CAD, + Peripheral Vascular Disease and +CHF  (-) Orthopnea negative cardio ROS Normal cardiovascular exam Rhythm:regular Rate:Normal     Neuro/Psych  Headaches, PSYCHIATRIC DISORDERS Anxiety  Neuromuscular disease negative neurological ROS  negative psych ROS   GI/Hepatic negative GI ROS, Neg liver ROS, GERD  Medicated,  Endo/Other  negative endocrine ROSdiabetes, Poorly Controlled  Renal/GU negative Renal ROS  negative genitourinary   Musculoskeletal   Abdominal   Peds  Hematology negative hematology ROS (+)   Anesthesia Other Findings Past Medical History: No date: Anxiety No date: Arthritis     Comment:  r knee No date: Asthma No date: CAD (coronary artery disease)     Comment:  a.) LHC 06/04/2009 at [facility]; non-obstructive CAD. b.) CTA               with FFR 10/08/2018: extensive mixed plaque proximal to               mid LAD (51-69%); Coronary Ca score 217; FFR 0.71 dPDA,               0.86 mLAD, 0.87 dLCx. 06/06/2008: [facility] (congestive cardiac failure) (HCC)     Comment:  a.) 30% EF. b.) TTE 06/03/2011: EF >55%; triv MR. c.)               TTE 04/27/2018: EF 55%; mild LVH; triv PR, mild MR/TR;               G1DD. No date: Complication of anesthesia No date: Eczema No date:  Family history of adverse reaction to anesthesia     Comment:  a.) PONV in mother and grandmother 05/10/2014: GERD (gastroesophageal reflux disease) No date: History of kidney stones No date: HLD (hyperlipidemia) No date: Migraines No date: Motion sickness No date: Panic attacks No date: Pneumonia No date: PONV (postoperative nausea and vomiting) No date: T2DM (type 2 diabetes mellitus) (HCC) 05/10/2014: Takotsubo cardiomyopathy / transient apical balooning  syndrome / stress-induced cardiomyopathy No date: Unspecified essential hypertension Past Surgical History: 03/16/2015: AMPUTATION TOE; Left     Comment:  Procedure: left fifth toe amputation with incision and               drainage;  Surgeon: Gwyneth Revels, DPM;  Location: [facility]               ORS;  Service: Podiatry;  Laterality: Left; 06/04/2009: CARDIAC CATHETERIZATION; Left     Comment:  Procedure: CARDIAC CATHETERIZATION; Location: [facility] No date: CHOLECYSTECTOMY 02/08/2021: IRRIGATION AND DEBRIDEMENT FOOT; Left     Comment:  Procedure: IRRIGATION AND DEBRIDEMENT FOOT - LFT HEEL               ULCER;  Surgeon: Gwyneth Revels, DPM;  Location: [facility]  ORS;  Service: Podiatry;  Laterality: Left; No date: kidney stone removal No date: KNEE ARTHROSCOPY W/ MENISCAL REPAIR 01/07/1989: NOSE SURGERY     Comment:  due to fracture No date: TOE AMPUTATION     Comment:  second toe No date: TONSILLECTOMY 01/11/2011: TOTAL KNEE ARTHROPLASTY     Comment:  Procedure: TOTAL KNEE ARTHROPLASTY;  Surgeon: Alta Corning;  Location: [place];  Service: Orthopedics;                Laterality: Right;  COMPUTER ASSISTED TOTAL KNEE               REPLACEMENT BMI    Body Mass Index: 35.58 kg/m     Reproductive/Obstetrics negative OB ROS                             Anesthesia Physical Anesthesia Plan  ASA: 4  Anesthesia Plan: General   Post-op Pain Management:     Induction:   PONV Risk Score and Plan: 4 or greater  Airway Management Planned:   Additional Equipment:   Intra-op Plan:   Post-operative Plan:   Informed Consent: I have reviewed the patients History and Physical, chart, labs and discussed the procedure including the risks, benefits and alternatives for the proposed anesthesia with the patient or authorized representative who has indicated his/her understanding and acceptance.     Dental Advisory Given  Plan Discussed with: CRNA  Anesthesia Plan Comments:         Anesthesia Quick Evaluation

## 2021-02-14 NOTE — Op Note (Incomplete Revision)
Operative note   Surgeon:Rosita Guzzetta Lawyer: None    Preop diagnosis: Abscess with osteomyelitis left heel and posterior lower leg    Postop diagnosis: Same    Procedure: 1.  I&D left lower leg Achilles tendon with debridement 2.  Debridement posterior left heel including posterior calcaneus 56 cm squared.  3.  Biopsy posterior left calcaneus 4.  Placement of wound VAC left posterior calcaneus    EBL: Minimal    Anesthesia:IV sedation    Hemostasis: None    Specimen: Posterior superior calcaneal bone for pathology rule out osteomyelitis    Complications: None    Operative indications:Nancy [name] is an 56 y.o. that presents today for surgical intervention.  The risks/benefits/alternatives/complications have been discussed and consent has been given.    Procedure:  Patient was brought into the OR and placed on the operating table in thelateral position. After anesthesia was obtained theleft lower extremity was prepped and draped in usual sterile fashion.  Attention was directed to the posterior aspect of the leg with a large posterior defect was noted to the entire calcaneus with exposure of the posterior and superior aspect of the entire calcaneus.  At this point there was noted to be a large amount of purulent drainage coming from the Achilles tendon region.  An incision was made along the posterior aspect of the lower leg to just distal to the gastroc soleal junction.  At this time the entire posterior Achilles was noted.  It was a large amount of purulent drainage encompassing the entire posterior Achilles tendon to the gastroc soleal region.  This was excised at this time.  Further dissection did not reveal any infection into the calf muscle at this time.  This area was then flushed with a pulse lavage of the entire course of the incision site.  Attention was then directed to the posterior aspect of the calcaneus with the use of the Versajet full-thickness excisional  debridement was performed including the posterior calcaneus and all of the surrounding tissue.  The ulcerative site measures approximately 8 cm x 7 cm.  All nonviable fibrotic tissue was excised.  At this time attention was directed to the posterior superior aspect of the calcaneus.  A punch biopsy was used to obtain a small 4 mm cylinder of the posterior superior calcaneus.  This was sent for pathological evaluation for rule out of osteomyelitis.  At this time all bleeding was cauterized as appropriate.  The area was infiltrated with Surgi-Flo with topical thrombin.  The 6 Achilles incisional site was reapproximated with a 2-0 nylon.  The most proximal aspect was then packed with a iodoform packing.  A wound VAC was applied to the remaining posterior superior calcaneus and entire posterior aspect of the heel.  This was placed at 125 mmHg continuous.  A good seal was noted.    Patient tolerated the procedure and anesthesia well.  Was transported from the OR to the PACU with all vital signs stable and vascular status intact. To be discharged per routine protocol.  Will follow up in approximately 1 week in the outpatient clinic.

## 2021-02-14 NOTE — Progress Notes (Signed)
PROGRESS NOTE    Nancy [name]  WPY:[id number] DOB: 1965/01/13 DOA: 02/07/2021 PCP: Miki Kins, FNP   Brief Narrative:  56 year old female with history of group B strep left second toe osteomyelitis status post amputation in 05/2011, prior right TKR in 2013, chronic left heel ulcer requiring recent debridement by podiatry as an outpatient presented with fever and severe left foot pain.  She was started on broad-spectrum antibiotics.  Podiatry was consulted.  She underwent surgical intervention by podiatry on 02/08/2021: Wound cultures grew Enterococcus faecalis; antibiotics changed to IV Unasyn by ID.  Podiatry is planning for further surgical intervention on 02/14/2021.  Assessment & Plan:   Acute left foot osteomyelitis/diabetic foot ulcer infection -Status post excisional debridement arthrotomy and I&D on 02/08/2021 by podiatry.  Wound cultures grew Enterococcus faecalis and Staph epidermidis: ID subsequently changed ID antibiotics to IV Unasyn considering Staph epidermidis as contaminant.  Will need 6 weeks of IV Unasyn till 03/20/2021. -Podiatry planning for further surgical intervention today.  Continue pain management.  Sepsis: Present on admission -Resolved  Diabetes mellitus type 2 uncontrolled with hyperglycemia -Continue long-acting insulin along with short-acting insulin along with CBGs with SSI  Nausea and vomiting: Resolved -Possibly from diabetic gastroparesis.  Currently tolerating diet  Hyponatremia -Resolved  Leukocytosis -Resolved  Anemia of chronic disease -Possibly from chronic illnesses.  Hemoglobin stable  Thrombocytosis -Possibly reactive  Tobacco abuse -Continue nicotine patch  Essential hypertension -Blood pressure currently stable.  Continue Coreg and lisinopril.  Hyperlipidemia -Continue statin  Obesity  -outpatient follow-up    DVT prophylaxis: Lovenox Code Status: Full Family Communication: Significant other at  bedside Disposition Plan: Status is: Inpatient Remains inpatient appropriate because: Of severity of illness.  Need for surgical intervention and IV antibiotics  Consultants: Podiatry/ID  Procedures: As above  Antimicrobials:  Anti-infectives (From admission, onward)    Start     Dose/Rate Route Frequency Ordered Stop   02/09/21 1630  Ampicillin-Sulbactam (UNASYN) 3 g in sodium chloride 0.9 % 100 mL IVPB        3 g 200 mL/hr over 30 Minutes Intravenous Every 6 hours 02/09/21 1522     02/08/21 1300  vancomycin (VANCOCIN) IVPB 1000 mg/200 mL premix  Status:  Discontinued        1,000 mg 200 mL/hr over 60 Minutes Intravenous Every 12 hours 02/08/21 0054 02/12/21 1631   02/08/21 0824  vancomycin (VANCOCIN) powder  Status:  Discontinued          As needed 02/08/21 0824 02/08/21 0836   02/08/21 0800  metroNIDAZOLE (FLAGYL) IVPB 500 mg  Status:  Discontinued        500 mg 100 mL/hr over 60 Minutes Intravenous Every 8 hours 02/08/21 0021 02/09/21 1522   02/08/21 0700  ceFEPIme (MAXIPIME) 2 g in sodium chloride 0.9 % 100 mL IVPB  Status:  Discontinued        2 g 200 mL/hr over 30 Minutes Intravenous Every 8 hours 02/08/21 0051 02/09/21 1522   02/08/21 0600  clindamycin (CLEOCIN) IVPB 900 mg  Status:  Discontinued        900 mg 100 mL/hr over 30 Minutes Intravenous On call to O.R. 02/08/21 0022 02/08/21 1257   02/08/21 0100  vancomycin (VANCOREADY) IVPB 1500 mg/300 mL        1,500 mg 150 mL/hr over 120 Minutes Intravenous  Once 02/08/21 0050 02/08/21 0416   02/07/21 2145  ceFEPIme (MAXIPIME) 2 g in sodium chloride 0.9 % 100 mL IVPB  2 g 200 mL/hr over 30 Minutes Intravenous  Once 02/07/21 2134 02/08/21 0027   02/07/21 2145  metroNIDAZOLE (FLAGYL) IVPB 500 mg        500 mg 100 mL/hr over 60 Minutes Intravenous  Once 02/07/21 2134 02/08/21 0109   02/07/21 2145  vancomycin (VANCOCIN) IVPB 1000 mg/200 mL premix        1,000 mg 200 mL/hr over 60 Minutes Intravenous  Once 02/07/21 2134  02/08/21 0133        Subjective: Patient seen and examined at bedside.  Denies worsening shortness of breath, nausea, vomiting.  Objective: Vitals:   02/13/21 2016 02/14/21 0438 02/14/21 0505 02/14/21 0732  BP: (!) 141/79 (!) 100/53 (!) 147/64 139/67  Pulse: 74 73 72 68  Resp: 16 16  17   Temp: 98.8 F (37.1 C) 98.7 F (37.1 C)  98.5 F (36.9 C)  TempSrc: Oral Oral    SpO2: 98% 96%  97%  Weight:      Height:        Intake/Output Summary (Last 24 hours) at 02/14/2021 1020 Last data filed at 02/14/2021 0900 Gross per 24 hour  Intake 400 ml  Output 75 ml  Net 325 ml   Filed Weights   02/07/21 2109 02/08/21 0624  Weight: 109.3 kg 109.3 kg    Examination:  General exam: Appears calm and comfortable.  Currently on room air. Respiratory system: Bilateral decreased breath sounds at bases Cardiovascular system: S1 & S2 heard, Rate controlled Gastrointestinal system: Abdomen is obese, nondistended, soft and nontender. Normal bowel sounds heard. Extremities: No cyanosis, clubbing; left foot dressing with wound VAC present    Data Reviewed: I have personally reviewed following labs and imaging studies  CBC: Recent Labs  Lab 02/07/21 2310 02/08/21 1411 02/09/21 0648 02/11/21 0414 02/13/21 0323  WBC 12.1* 11.0* 8.8 8.8 9.9  NEUTROABS 8.9*  --  5.5 5.2 6.3  HGB 11.9* 11.5* 10.9* 10.1* 10.0*  HCT 37.0 35.5* 33.9* 31.6* 31.4*  MCV 92.7 92.2 91.1 92.9 91.5  PLT 430* 469* 420* 493* 572*   Basic Metabolic Panel: Recent Labs  Lab 02/07/21 2310 02/08/21 1411 02/09/21 0648 02/10/21 0408 02/11/21 0414 02/13/21 0323  NA 131* 130* 133*  --  135 135  K 4.1 4.0 3.7  --  3.6 3.8  CL 96* 98 98  --  99 98  CO2 23 24 26   --  28 29  GLUCOSE 344* 245* 240*  --  255* 246*  BUN 16 15 16   --  11 12  CREATININE 0.83 0.66 0.60 0.65 0.62 0.65  CALCIUM 8.7* 8.2* 8.4*  --  8.5* 8.5*   GFR: Estimated Creatinine Clearance: 103.4 mL/min (by C-G formula based on SCr of 0.65  mg/dL). Liver Function Tests: Recent Labs  Lab 02/07/21 2310 02/09/21 0648 02/11/21 0414  AST 12* 13* 10*  ALT 13 11 11   ALKPHOS 80 71 57  BILITOT 0.7 0.5 0.4  PROT 7.0 6.0* 5.7*  ALBUMIN 2.3* 1.9* 1.8*   No results for input(s): LIPASE, AMYLASE in the last 168 hours. No results for input(s): AMMONIA in the last 168 hours. Coagulation Profile: Recent Labs  Lab 02/07/21 2310  INR 1.2   Cardiac Enzymes: No results for input(s): CKTOTAL, CKMB, CKMBINDEX, TROPONINI in the last 168 hours. BNP (last 3 results) No results for input(s): PROBNP in the last 8760 hours. HbA1C: No results for input(s): HGBA1C in the last 72 hours. CBG: Recent Labs  Lab 02/13/21 0757 02/13/21 1124 02/13/21 1631  02/13/21 2045 02/14/21 0733  GLUCAP 191* 206* 235* 211* 265*   Lipid Profile: No results for input(s): CHOL, HDL, LDLCALC, TRIG, CHOLHDL, LDLDIRECT in the last 72 hours. Thyroid Function Tests: No results for input(s): TSH, T4TOTAL, FREET4, T3FREE, THYROIDAB in the last 72 hours. Anemia Panel: No results for input(s): VITAMINB12, FOLATE, FERRITIN, TIBC, IRON, RETICCTPCT in the last 72 hours. Sepsis Labs: Recent Labs  Lab 02/07/21 2310 02/08/21 0148 02/08/21 0318  LATICACIDVEN 1.5 8.5* 0.9    Recent Results (from the past 240 hour(s))  Resp Panel by RT-PCR (Flu A&B, Covid) Nasopharyngeal Swab     Status: None   Collection Time: 02/07/21 11:10 PM   Specimen: Nasopharyngeal Swab; Nasopharyngeal(NP) swabs in vial transport medium  Result Value Ref Range Status   SARS Coronavirus 2 by RT PCR NEGATIVE NEGATIVE Final    Comment: (NOTE) SARS-CoV-2 target nucleic acids are NOT DETECTED.  The SARS-CoV-2 RNA is generally detectable in upper respiratory specimens during the acute phase of infection. The lowest concentration of SARS-CoV-2 viral copies this assay can detect is 138 copies/mL. A negative result does not preclude SARS-Cov-2 infection and should not be used as the sole basis  for treatment or other patient management decisions. A negative result may occur with  improper specimen collection/handling, submission of specimen other than nasopharyngeal swab, presence of viral mutation(s) within the areas targeted by this assay, and inadequate number of viral copies(<138 copies/mL). A negative result must be combined with clinical observations, patient history, and epidemiological information. The expected result is Negative.  Fact Sheet for Patients:  BloggerCourse.com  Fact Sheet for Healthcare Providers:  SeriousBroker.it  This test is no t yet approved or cleared by the [place] FDA and  has been authorized for detection and/or diagnosis of SARS-CoV-2 by FDA under an Emergency Use Authorization (EUA). This EUA will remain  in effect (meaning this test can be used) for the duration of the COVID-19 declaration under Section 564(b)(1) of the Act, 21 U.S.C.section 360bbb-3(b)(1), unless the authorization is terminated  or revoked sooner.       Influenza A by PCR NEGATIVE NEGATIVE Final   Influenza B by PCR NEGATIVE NEGATIVE Final    Comment: (NOTE) The Xpert Xpress SARS-CoV-2/FLU/RSV plus assay is intended as an aid in the diagnosis of influenza from Nasopharyngeal swab specimens and should not be used as a sole basis for treatment. Nasal washings and aspirates are unacceptable for Xpert Xpress SARS-CoV-2/FLU/RSV testing.  Fact Sheet for Patients: BloggerCourse.com  Fact Sheet for Healthcare Providers: SeriousBroker.it  This test is not yet approved or cleared by the [place] FDA and has been authorized for detection and/or diagnosis of SARS-CoV-2 by FDA under an Emergency Use Authorization (EUA). This EUA will remain in effect (meaning this test can be used) for the duration of the COVID-19 declaration under Section 564(b)(1) of the Act, 21  U.S.C. section 360bbb-3(b)(1), unless the authorization is terminated or revoked.  Performed at [facility], [address] [place], [place] [address]   Blood Culture (routine x 2)     Status: None   Collection Time: 02/07/21 11:10 PM   Specimen: BLOOD  Result Value Ref Range Status   Specimen Description BLOOD LEFT FOREARM  Final   Special Requests   Final    BOTTLES DRAWN AEROBIC AND ANAEROBIC Blood Culture results may not be optimal due to an excessive volume of blood received in culture bottles   Culture   Final    NO GROWTH 5 DAYS  Performed at [facility], [address] [place], [place] [address]    Report Status 02/12/2021 FINAL  Final  Blood Culture (routine x 2)     Status: None   Collection Time: 02/07/21 11:10 PM   Specimen: BLOOD  Result Value Ref Range Status   Specimen Description BLOOD LEFT ASSIST CONTROL  Final   Special Requests   Final    BOTTLES DRAWN AEROBIC AND ANAEROBIC Blood Culture adequate volume   Culture   Final    NO GROWTH 5 DAYS Performed at [facility], [address] [place], [place] [address]    Report Status 02/12/2021 FINAL  Final  Urine Culture     Status: None   Collection Time: 02/07/21 11:10 PM   Specimen: In/Out Cath Urine  Result Value Ref Range Status   Specimen Description   Final    IN/OUT CATH URINE Performed at [facility], [address] [place], [place] [address]    Special Requests   Final    NONE Performed at [facility], [address] [place], [place] [address]    Culture   Final    NO GROWTH Performed at [facility], [address] [place], [place] [address]    Report Status 02/09/2021 FINAL  Final  Aerobic/Anaerobic Culture w Gram Stain (surgical/deep wound)     Status: None   Collection Time: 02/08/21  7:55 AM   Specimen: PATH Bone resection; Tissue  Result Value Ref Range Status   Specimen Description   Final    BONE LEFT CALCANEUS Performed  at [facility], [address] [place], [place] [address]    Special Requests   Final    NONE Performed at [facility], [address] [place], [place] [address]    Gram Stain RARE WBC SEEN FEW GRAM POSITIVE COCCI   Final   Culture   Final    MODERATE ENTEROCOCCUS FAECALIS RARE STAPHYLOCOCCUS EPIDERMIDIS MODERATE FINEGOLDIA MAGNA Standardized susceptibility testing for this organism is not available. Performed at [facility], [address] [place], [place] [address]    Report Status 02/12/2021 FINAL  Final   Organism ID, Bacteria ENTEROCOCCUS FAECALIS  Final   Organism ID, Bacteria STAPHYLOCOCCUS EPIDERMIDIS  Final      Susceptibility   Enterococcus faecalis - MIC*    AMPICILLIN <=2 SENSITIVE Sensitive     VANCOMYCIN 1 SENSITIVE Sensitive     GENTAMICIN SYNERGY RESISTANT Resistant     * MODERATE ENTEROCOCCUS FAECALIS   Staphylococcus epidermidis - MIC*    CIPROFLOXACIN 4 RESISTANT Resistant     ERYTHROMYCIN >=8 RESISTANT Resistant     GENTAMICIN <=0.5 SENSITIVE Sensitive     OXACILLIN <=0.25 SENSITIVE Sensitive     TETRACYCLINE >=16 RESISTANT Resistant     VANCOMYCIN 1 SENSITIVE Sensitive     TRIMETH/SULFA <=10 SENSITIVE Sensitive     CLINDAMYCIN <=0.25 SENSITIVE Sensitive     RIFAMPIN <=0.5 SENSITIVE Sensitive     Inducible Clindamycin NEGATIVE Sensitive     * RARE STAPHYLOCOCCUS EPIDERMIDIS  Surgical pcr screen     Status: None   Collection Time: 02/13/21  1:56 PM   Specimen: Nasal Mucosa; Nasal Swab  Result Value Ref Range Status   MRSA, PCR NEGATIVE NEGATIVE Final   Staphylococcus aureus NEGATIVE NEGATIVE Final    Comment: (NOTE) The Xpert SA Assay (FDA approved for NASAL specimens in patients 57 years of age and older), is one component of a comprehensive  surveillance program. It is not intended to diagnose infection nor to guide or monitor treatment. Performed at [facility], [address] [place], [place]  [address]          Radiology Studies: MR ANKLE LEFT WO CONTRAST  Result Date: 02/12/2021 CLINICAL DATA:  Osteomyelitis suspected. EXAM: MRI OF THE LEFT ANKLE WITHOUT CONTRAST TECHNIQUE: Multiplanar, multisequence MR imaging of the ankle was performed. No intravenous contrast was administered. COMPARISON:  Radiographs dated February 07, 2021 FINDINGS: TENDONS Peroneal: Peroneal longus tendon intact. Peroneal brevis intact. Posteromedial: Posterior tibial tendon intact. Flexor hallucis longus tendon intact. Flexor digitorum longus tendon intact. Anterior: Tibialis anterior tendon intact. Extensor hallucis longus tendon intact Extensor digitorum longus tendon intact. Achilles: There is avulsion fracture about the insertion of the Achilles with small avulsed, osseous fragment above the remaining calcaneus. There is large adjacent deep skin wound. Plantar Fascia: Intact. LIGAMENTS Lateral: Anterior talofibular ligament intact. Calcaneofibular ligament intact. Posterior talofibular ligament intact. Anterior and posterior tibiofibular ligaments intact. Medial: Chronic degeneration of the deltoid ligament. Chronic degenerative tear of the spring ligament. CARTILAGE Ankle Joint: No joint effusion. Normal ankle mortise. No chondral defect. Subtalar Joints/Sinus Tarsi: Subtalar joint intact. No joint effusion. Bones: Marrow edema of the posterior aspect of the calcaneus with a pathological avulsion fracture of the Achilles. Partial subluxations of the talus at the talonavicular joint, likely sequela of chronic ligamentous injury/degeneration in the settings of Charcot arthropathy. There is also subchondral edema at the calcaneocuboid joint. Generalized heterogeneous marrow of the distal tibia and fibula as well as multiple tarsal bones. Soft Tissue: No fluid collection or hematoma. Muscles are normal without edema or atrophy. Tarsal tunnel is normal. IMPRESSION: 1. Osteomyelitis of the posterior aspect of the calcaneus  with avulsion fracture about the insertion of the Achilles tendon. Large skin defect about the posterior aspect of the calcaneus. 2. Partial subluxation at the talonavicular joint, likely sequela of chronic ligamentous injury and/or Charcot arthropathy. 3. Generalized heterogeneous marrow of the calcaneus as well as degenerative changes at the talonavicular and calcaneocuboid joints. 4. Generalized subcutaneous soft tissue edema with myopathy/myositis of the plantar muscles. 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 No drainable fluid collection or abscess. Electronically Signed   By: Larose Hires D.O.   On: 02/12/2021 17:29        Scheduled Meds:  acidophilus  1 capsule Oral Daily   vitamin C  500 mg Oral BID   carvedilol  3.125 mg Oral BID WC   docusate sodium  100 mg Oral BID   enoxaparin (LOVENOX) injection  0.5 mg/kg Subcutaneous Q24H   fenofibrate  54 mg Oral Daily   insulin aspart  0-15 Units Subcutaneous TID WC   insulin aspart  3 Units Subcutaneous TID WC   insulin glargine-yfgn  25 Units Subcutaneous Daily   lisinopril  5 mg Oral BID   montelukast  10 mg Oral QHS   multivitamin with minerals  1 tablet Oral Daily   nicotine  14 mg Transdermal Daily   nutrition supplement (JUVEN)  1 packet Oral BID BM   ondansetron (ZOFRAN) IV  4 mg Intravenous Once   pantoprazole  40 mg Oral Daily   pravastatin  40 mg Oral QHS   Ensure Max Protein  11 oz Oral QHS   saccharomyces boulardii  250 mg Oral BID   venlafaxine XR  37.5 mg Oral Q breakfast   zinc sulfate  220 mg Oral Daily   Continuous Infusions:  sodium chloride 10 mL/hr at 02/10/21  0302   ampicillin-sulbactam (UNASYN) IV 3 g (02/14/21 16100814)          Glade LloydKshitiz Shatasia Cutshaw, MD [facility] 02/14/2021, 10:20 AM

## 2021-02-14 NOTE — Transfer of Care (Signed)
Immediate Anesthesia Transfer of Care Note  Patient: Nancy [name]  Procedure(s) Performed: IRRIGATION AND DEBRIDEMENT LEFT HEEL (Left) APPLICATION OF WOUND VAC (Left)  Patient Location: PACU  Anesthesia Type:MAC  Level of Consciousness: drowsy  Airway & Oxygen Therapy: Patient Spontanous Breathing  Post-op Assessment: Report given to RN and Post -op Vital signs reviewed and stable  Post vital signs: Reviewed and stable  Last Vitals:  Vitals Value Taken Time  BP 143/73 02/14/21 1535  Temp    Pulse 72 02/14/21 1542  Resp 17 02/14/21 1542  SpO2 98 % 02/14/21 1542  Vitals shown include unvalidated device data.  Last Pain:  Vitals:   02/14/21 1535  TempSrc:   PainSc: 0-No pain      Patients Stated Pain Goal: 0 (61/53/79 4327)  Complications: No notable events documented.

## 2021-02-14 NOTE — TOC Progression Note (Signed)
Transition of Care [facility]) - Progression Note    Patient Details  Name: Nancy [name] MRN: [id number] Date of Birth: 1965/08/25  Transition of [facility]) CM/SW Contact  Maree Krabbe, LCSW Phone Number: 02/14/2021, 4:34 PM  Clinical Narrative:   CSW following for potential home iv antibiotics.         Expected Discharge Plan and Services                                                 Social Determinants of Health (SDOH) Interventions    Readmission Risk Interventions No flowsheet data found.

## 2021-02-14 NOTE — Anesthesia Postprocedure Evaluation (Signed)
Anesthesia Post Note  Patient: Nancy [name]  Procedure(s) Performed: IRRIGATION AND DEBRIDEMENT LEFT HEEL (Left) APPLICATION OF WOUND VAC (Left)  Patient location during evaluation: PACU Anesthesia Type: General Level of consciousness: awake and alert, oriented and patient cooperative Pain management: pain level controlled Vital Signs Assessment: post-procedure vital signs reviewed and stable Respiratory status: spontaneous breathing, nonlabored ventilation and respiratory function stable Cardiovascular status: blood pressure returned to baseline and stable Postop Assessment: adequate PO intake Anesthetic complications: no   No notable events documented.   Last Vitals:  Vitals:   02/14/21 1545 02/14/21 1600  BP: 135/76 135/76  Pulse: 71 67  Resp: 18 11  Temp:  37.1 C  SpO2: 97% 95%    Last Pain:  Vitals:   02/14/21 1558  TempSrc:   PainSc: 5                  Reed Breech

## 2021-02-14 NOTE — Plan of Care (Signed)
°  Problem: Education: Goal: Knowledge of General Education information will improve Description: Including pain rating scale, medication(s)/side effects and non-pharmacologic comfort measures Outcome: Progressing   Problem: Health Behavior/Discharge Planning: Goal: Ability to manage health-related needs will improve Outcome: Progressing   Problem: Clinical Measurements: Goal: Will remain free from infection Outcome: Progressing   Problem: Clinical Measurements: Goal: Respiratory complications will improve Outcome: Progressing   Problem: Elimination: Goal: Will not experience complications related to urinary retention Outcome: Progressing   Problem: Safety: Goal: Ability to remain free from injury will improve Outcome: Progressing   Problem: Skin Integrity: Goal: Risk for impaired skin integrity will decrease Outcome: Progressing

## 2021-02-14 NOTE — Consult Note (Signed)
Patient for NPWT dressing change to the left heel today, she is scheduled for surgery today at 150pm will have VAC replaced if needed per podiatry.  Will follow up for Friday dressing change if needed.  Nancy [name] [facility], CNS, [employer] [phone]

## 2021-02-14 NOTE — Progress Notes (Signed)
Date of Admission:  02/07/2021     ID: Nancy [name] is a 56 y.o. female  Principal Problem:   Diabetic foot ulcer ([place]) Active Problems:   Essential hypertension   Diabetes mellitus, type 2 ([place])   Sepsis secondary to diabetic foot infection   Tobacco abuse   Hyponatremia    Subjective: Patient doing fine Underwent  I&D left lower leg Achilles tendon with debridement 2.  Debridement posterior left heel including posterior calcaneus 3.  Biopsy posterior left calcaneus 4.  Placement of wound VAC left posterior calcaneus   Medications:   acidophilus  1 capsule Oral Daily   vitamin C  500 mg Oral BID   carvedilol  3.125 mg Oral BID WC   docusate sodium  100 mg Oral BID   enoxaparin (LOVENOX) injection  0.5 mg/kg Subcutaneous Q24H   fenofibrate  54 mg Oral Daily   insulin aspart  0-15 Units Subcutaneous TID WC   insulin aspart  3 Units Subcutaneous TID WC   insulin glargine-yfgn  25 Units Subcutaneous Daily   lisinopril  5 mg Oral BID   montelukast  10 mg Oral QHS   multivitamin with minerals  1 tablet Oral Daily   nicotine  14 mg Transdermal Daily   nutrition supplement (JUVEN)  1 packet Oral BID BM   ondansetron (ZOFRAN) IV  4 mg Intravenous Once   pantoprazole  40 mg Oral Daily   pravastatin  40 mg Oral QHS   Ensure Max Protein  11 oz Oral QHS   saccharomyces boulardii  250 mg Oral BID   venlafaxine XR  37.5 mg Oral Q breakfast   zinc sulfate  220 mg Oral Daily    Objective: Vital signs in last 24 hours: Temp:  [98.5 F (36.9 C)-99.4 F (37.4 C)] 98.5 F (36.9 C) (02/08 0732) Pulse Rate:  [68-78] 68 (02/08 0732) Resp:  [16-17] 17 (02/08 0732) BP: (100-157)/(53-79) 139/67 (02/08 0732) SpO2:  [96 %-98 %] 97 % (02/08 0732)  PHYSICAL EXAM:  General: Alert, cooperative, no distress, appears stated age.  Lungs: Clear to auscultation bilaterally. No Wheezing or Rhonchi. No rales. Heart: Regular rate and rhythm, no murmur, rub or gallop. Abdomen: Soft,  non-tender,not distended. Bowel sounds normal. No masses Extremities: Left foot has wound VAC on the heel 02/12/21   02/08/21   Skin: No rashes or lesions. Or bruising Lymph: Cervical, supraclavicular normal. Neurologic: Grossly non-focal  Lab Results Recent Labs    02/13/21 0323  WBC 9.9  HGB 10.0*  HCT 31.4*  NA 135  K 3.8  CL 98  CO2 29  BUN 12  CREATININE 0.65     Microbiology: Left heel wound culture Enterococcus faecalis Staph epidermidis Fingoldia magna Studies/Results: MR ANKLE LEFT WO CONTRAST  Result Date: 02/12/2021 CLINICAL DATA:  Osteomyelitis suspected. EXAM: MRI OF THE LEFT ANKLE WITHOUT CONTRAST TECHNIQUE: Multiplanar, multisequence MR imaging of the ankle was performed. No intravenous contrast was administered. COMPARISON:  Radiographs dated February 07, 2021 FINDINGS: TENDONS Peroneal: Peroneal longus tendon intact. Peroneal brevis intact. Posteromedial: Posterior tibial tendon intact. Flexor hallucis longus tendon intact. Flexor digitorum longus tendon intact. Anterior: Tibialis anterior tendon intact. Extensor hallucis longus tendon intact Extensor digitorum longus tendon intact. Achilles: There is avulsion fracture about the insertion of the Achilles with small avulsed, osseous fragment above the remaining calcaneus. There is large adjacent deep skin wound. Plantar Fascia: Intact. LIGAMENTS Lateral: Anterior talofibular ligament intact. Calcaneofibular ligament intact. Posterior talofibular ligament intact. Anterior and posterior tibiofibular  ligaments intact. Medial: Chronic degeneration of the deltoid ligament. Chronic degenerative tear of the spring ligament. CARTILAGE Ankle Joint: No joint effusion. Normal ankle mortise. No chondral defect. Subtalar Joints/Sinus Tarsi: Subtalar joint intact. No joint effusion. Bones: Marrow edema of the posterior aspect of the calcaneus with a pathological avulsion fracture of the Achilles. Partial subluxations of the talus at  the talonavicular joint, likely sequela of chronic ligamentous injury/degeneration in the settings of Charcot arthropathy. There is also subchondral edema at the calcaneocuboid joint. Generalized heterogeneous marrow of the distal tibia and fibula as well as multiple tarsal bones. Soft Tissue: No fluid collection or hematoma. Muscles are normal without edema or atrophy. Tarsal tunnel is normal. IMPRESSION: 1. Osteomyelitis of the posterior aspect of the calcaneus with avulsion fracture about the insertion of the Achilles tendon. Large skin defect about the posterior aspect of the calcaneus. 2. Partial subluxation at the talonavicular joint, likely sequela of chronic ligamentous injury and/or Charcot arthropathy. 3. Generalized heterogeneous marrow of the calcaneus as well as degenerative changes at the talonavicular and calcaneocuboid joints. 4. Generalized subcutaneous soft tissue edema with myopathy/myositis of the plantar muscles. 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 No drainable fluid collection or abscess. Electronically Signed   By: Keane Police D.O.   On: 02/12/2021 17:29     Assessment/Plan: Diabetic foot infection with necrotic wound with osteomyelitis of the left heel.  Status post debridement and partial excision of the calcaneal bone.  The patient has exposed calcaneus.  She is gotten wound VAC currently.  Wound culture had Enterococcus faecalis., finegoldia magna  Staph epi is a contaminant. Unasyn is adequate .  Will need 6 weeks of IV Unasyn- end date 03/26/21  Diabetes mellitus on insulin and metformin  Penicillin allergy as a child but has tolerated Unasyn very well  Discussed the management with the patient and her wife

## 2021-02-14 NOTE — Op Note (Addendum)
Operative note ° ° Surgeon:Jermari Tamargo ° °  Assistant: None ° °  Preop diagnosis: Abscess with osteomyelitis left heel and posterior lower leg ° °  Postop diagnosis: Same ° °  Procedure: 1.  I&D left lower leg Achilles tendon with debridement 2.  Debridement posterior left heel including posterior calcaneus 56 cm squared.  3.  Biopsy posterior left calcaneus 4.  Placement of wound VAC left posterior calcaneus ° °  EBL: Minimal ° °  Anesthesia:IV sedation ° °  Hemostasis: None ° °  Specimen: Posterior superior calcaneal bone for pathology rule out osteomyelitis ° °  Complications: None ° °  Operative indications:Taniya S Atkison is an 55 y.o. that presents today for surgical intervention.  The risks/benefits/alternatives/complications have been discussed and consent has been given. ° °  Procedure:  °Patient was brought into the OR and placed on the operating table in thelateral position. After anesthesia was obtained theleft lower extremity was prepped and draped in usual sterile fashion. ° °Attention was directed to the posterior aspect of the leg with a large posterior defect was noted to the entire calcaneus with exposure of the posterior and superior aspect of the entire calcaneus.  At this point there was noted to be a large amount of purulent drainage coming from the Achilles tendon region.  An incision was made along the posterior aspect of the lower leg to just distal to the gastroc soleal junction.  At this time the entire posterior Achilles was noted.  It was a large amount of purulent drainage encompassing the entire posterior Achilles tendon to the gastroc soleal region.  This was excised at this time.  Further dissection did not reveal any infection into the calf muscle at this time.  This area was then flushed with a pulse lavage of the entire course of the incision site.  Attention was then directed to the posterior aspect of the calcaneus with the use of the Versajet full-thickness excisional  debridement was performed including the posterior calcaneus and all of the surrounding tissue.  The ulcerative site measures approximately 8 cm x 7 cm.  All nonviable fibrotic tissue was excised.  At this time attention was directed to the posterior superior aspect of the calcaneus.  A punch biopsy was used to obtain a small 4 mm cylinder of the posterior superior calcaneus.  This was sent for pathological evaluation for rule out of osteomyelitis.  At this time all bleeding was cauterized as appropriate.  The area was infiltrated with Surgi-Flo with topical thrombin.  The 6 Achilles incisional site was reapproximated with a 2-0 nylon.  The most proximal aspect was then packed with a iodoform packing.  A wound VAC was applied to the remaining posterior superior calcaneus and entire posterior aspect of the heel.  This was placed at 125 mmHg continuous.  A good seal was noted. ° °  Patient tolerated the procedure and anesthesia well.  Was transported from the OR to the PACU with all vital signs stable and vascular status intact. To be discharged per routine protocol.  Will follow up in approximately 1 week in the outpatient clinic. ° °

## 2021-02-15 ENCOUNTER — Inpatient Hospital Stay: Payer: Self-pay

## 2021-02-15 ENCOUNTER — Encounter: Payer: Self-pay | Admitting: Podiatry

## 2021-02-15 DIAGNOSIS — I10 Essential (primary) hypertension: Secondary | ICD-10-CM | POA: Diagnosis not present

## 2021-02-15 DIAGNOSIS — M86172 Other acute osteomyelitis, left ankle and foot: Secondary | ICD-10-CM | POA: Diagnosis not present

## 2021-02-15 DIAGNOSIS — L97424 Non-pressure chronic ulcer of left heel and midfoot with necrosis of bone: Secondary | ICD-10-CM | POA: Diagnosis not present

## 2021-02-15 DIAGNOSIS — E11621 Type 2 diabetes mellitus with foot ulcer: Secondary | ICD-10-CM | POA: Diagnosis not present

## 2021-02-15 LAB — GLUCOSE, CAPILLARY
Glucose-Capillary: 173 mg/dL — ABNORMAL HIGH (ref 70–99)
Glucose-Capillary: 249 mg/dL — ABNORMAL HIGH (ref 70–99)
Glucose-Capillary: 240 mg/dL — ABNORMAL HIGH (ref 70–99)
Glucose-Capillary: 208 mg/dL — ABNORMAL HIGH (ref 70–99)
Glucose-Capillary: 210 mg/dL — ABNORMAL HIGH (ref 70–99)

## 2021-02-15 MED ORDER — INSULIN ASPART 100 UNIT/ML IJ SOLN
5.0000 [IU] | Freq: Three times a day (TID) | INTRAMUSCULAR | Status: DC
  Administered 2021-02-15 – 2021-02-18 (×9): 5 [IU] via SUBCUTANEOUS
  Filled 2021-02-15 (×9): qty 1

## 2021-02-15 MED ORDER — SODIUM CHLORIDE 0.9% FLUSH: 10.0000 mL | INTRAVENOUS | Status: DC | PRN

## 2021-02-15 MED ORDER — CHLORHEXIDINE GLUCONATE CLOTH 2 % EX PADS
6.0000 | MEDICATED_PAD | Freq: Every day | CUTANEOUS | Status: DC
  Administered 2021-02-15 – 2021-02-18 (×4): 6 via TOPICAL

## 2021-02-15 MED ORDER — SODIUM CHLORIDE 0.9% FLUSH
10.0000 mL | Freq: Two times a day (BID) | INTRAVENOUS | Status: DC
  Administered 2021-02-15 – 2021-02-18 (×6): 10 mL

## 2021-02-15 NOTE — Progress Notes (Signed)
PROGRESS NOTE    Nancy [name]  [id number] DOB: 1965-12-22 DOA: 02/07/2021 PCP: Mechele Claude, FNP   Brief Narrative:  56 year old female with history of group B strep left second toe osteomyelitis status post amputation in 05/2011, prior right TKR in 2013, chronic left heel ulcer requiring recent debridement by podiatry as an outpatient presented with fever and severe left foot pain.  She was started on broad-spectrum antibiotics.  Podiatry was consulted.  She underwent surgical intervention by podiatry on 02/08/2021: Wound cultures grew Enterococcus faecalis; antibiotics changed to IV Unasyn by ID.  She underwent further surgical intervention on 02/14/2021.  Assessment & Plan:   Acute left foot osteomyelitis/diabetic foot ulcer infection -Status post excisional debridement arthrotomy and I&D on 02/08/2021 by podiatry.  Wound cultures grew Enterococcus faecalis and Staph epidermidis: ID subsequently changed ID antibiotics to IV Unasyn considering Staph epidermidis as contaminant.  Will need 6 weeks of IV Unasyn till 03/20/2021. -Status post repeat I&D with placement of wound VAC on 02/14/2021 by podiatry.  Follow further recommendations from podiatry.  Continue pain management.  Sepsis: Present on admission -Resolved  Diabetes mellitus type 2 uncontrolled with hyperglycemia -Continue long-acting insulin along with short-acting insulin along with CBGs with SSI  Nausea and vomiting: Resolved -Possibly from diabetic gastroparesis.  Currently tolerating diet  Hyponatremia -Resolved  Leukocytosis -Resolved  Anemia of chronic disease -Possibly from chronic illnesses.  Hemoglobin stable  Thrombocytosis -Possibly reactive  Tobacco abuse -Continue nicotine patch  Essential hypertension -Blood pressure currently stable.  Continue Coreg and lisinopril.  Hyperlipidemia -Continue statin  Obesity  -outpatient follow-up    DVT prophylaxis: Lovenox Code Status: Full Family  Communication: Significant other at bedside on 02/14/2021 Disposition Plan: Status is: Inpatient Remains inpatient appropriate because: Of severity of illness.  Need for surgical intervention and IV antibiotics  Consultants: Podiatry/ID  Procedures: As above  Antimicrobials:  Anti-infectives (From admission, onward)    Start     Dose/Rate Route Frequency Ordered Stop   02/09/21 1630  Ampicillin-Sulbactam (UNASYN) 3 g in sodium chloride 0.9 % 100 mL IVPB        3 g 200 mL/hr over 30 Minutes Intravenous Every 6 hours 02/09/21 1522     02/08/21 1300  vancomycin (VANCOCIN) IVPB 1000 mg/200 mL premix  Status:  Discontinued        1,000 mg 200 mL/hr over 60 Minutes Intravenous Every 12 hours 02/08/21 0054 02/12/21 1631   02/08/21 0824  vancomycin (VANCOCIN) powder  Status:  Discontinued          As needed 02/08/21 0824 02/08/21 0836   02/08/21 0800  metroNIDAZOLE (FLAGYL) IVPB 500 mg  Status:  Discontinued        500 mg 100 mL/hr over 60 Minutes Intravenous Every 8 hours 02/08/21 0021 02/09/21 1522   02/08/21 0700  ceFEPIme (MAXIPIME) 2 g in sodium chloride 0.9 % 100 mL IVPB  Status:  Discontinued        2 g 200 mL/hr over 30 Minutes Intravenous Every 8 hours 02/08/21 0051 02/09/21 1522   02/08/21 0600  clindamycin (CLEOCIN) IVPB 900 mg  Status:  Discontinued        900 mg 100 mL/hr over 30 Minutes Intravenous On call to O.R. 02/08/21 0022 02/08/21 1257   02/08/21 0100  vancomycin (VANCOREADY) IVPB 1500 mg/300 mL        1,500 mg 150 mL/hr over 120 Minutes Intravenous  Once 02/08/21 0050 02/08/21 0416   02/07/21 2145  ceFEPIme (MAXIPIME) 2 g  in sodium chloride 0.9 % 100 mL IVPB        2 g 200 mL/hr over 30 Minutes Intravenous  Once 02/07/21 2134 02/08/21 0027   02/07/21 2145  metroNIDAZOLE (FLAGYL) IVPB 500 mg        500 mg 100 mL/hr over 60 Minutes Intravenous  Once 02/07/21 2134 02/08/21 0109   02/07/21 2145  vancomycin (VANCOCIN) IVPB 1000 mg/200 mL premix        1,000 mg 200 mL/hr  over 60 Minutes Intravenous  Once 02/07/21 2134 02/08/21 0133        Subjective: Patient seen and examined at bedside.  No fever, vomiting, worsening shortness of breath reported. Objective: Vitals:   02/14/21 1619 02/14/21 1953 02/14/21 2307 02/15/21 0328  BP: (!) 128/55 (!) 128/53 (!) 115/105 124/66  Pulse: 69 74 77 71  Resp: 18 15 18 14   Temp: 98.1 F (36.7 C) 98.5 F (36.9 C) 98.8 F (37.1 C) 98.4 F (36.9 C)  TempSrc:  Oral    SpO2: 98% 98%  95%  Weight:      Height:        Intake/Output Summary (Last 24 hours) at 02/15/2021 0735 Last data filed at 02/14/2021 1456 Gross per 24 hour  Intake 100 ml  Output 100 ml  Net 0 ml    Filed Weights   02/07/21 2109 02/08/21 0624 02/14/21 1335  Weight: 109.3 kg 109.3 kg 109.3 kg    Examination:  General exam: On room air.  No distress. Respiratory system: Decreased breath sounds at bases bilaterally, no wheezing  cardiovascular system: Currently rate controlled; S1-S2 heart gastrointestinal system: Abdomen is obese, distended slightly; soft and nontender.  Bowel sounds are heard  extremities: Left foot has dressing present with wound VAC; no clubbing  Data Reviewed: I have personally reviewed following labs and imaging studies  CBC: Recent Labs  Lab 02/08/21 1411 02/09/21 0648 02/11/21 0414 02/13/21 0323  WBC 11.0* 8.8 8.8 9.9  NEUTROABS  --  5.5 5.2 6.3  HGB 11.5* 10.9* 10.1* 10.0*  HCT 35.5* 33.9* 31.6* 31.4*  MCV 92.2 91.1 92.9 91.5  PLT 469* 420* 493* 572*    Basic Metabolic Panel: Recent Labs  Lab 02/08/21 1411 02/09/21 0648 02/10/21 0408 02/11/21 0414 02/13/21 0323  NA 130* 133*  --  135 135  K 4.0 3.7  --  3.6 3.8  CL 98 98  --  99 98  CO2 24 26  --  28 29  GLUCOSE 245* 240*  --  255* 246*  BUN 15 16  --  11 12  CREATININE 0.66 0.60 0.65 0.62 0.65  CALCIUM 8.2* 8.4*  --  8.5* 8.5*    GFR: Estimated Creatinine Clearance: 103.4 mL/min (by C-G formula based on SCr of 0.65 mg/dL). Liver  Function Tests: Recent Labs  Lab 02/09/21 0648 02/11/21 0414  AST 13* 10*  ALT 11 11  ALKPHOS 71 57  BILITOT 0.5 0.4  PROT 6.0* 5.7*  ALBUMIN 1.9* 1.8*    No results for input(s): LIPASE, AMYLASE in the last 168 hours. No results for input(s): AMMONIA in the last 168 hours. Coagulation Profile: No results for input(s): INR, PROTIME in the last 168 hours.  Cardiac Enzymes: No results for input(s): CKTOTAL, CKMB, CKMBINDEX, TROPONINI in the last 168 hours. BNP (last 3 results) No results for input(s): PROBNP in the last 8760 hours. HbA1C: No results for input(s): HGBA1C in the last 72 hours. CBG: Recent Labs  Lab 02/14/21 1127 02/14/21 1345 02/14/21  1538 02/14/21 1616 02/14/21 2030  GLUCAP 188* 124* 106* 100* 181*    Lipid Profile: No results for input(s): CHOL, HDL, LDLCALC, TRIG, CHOLHDL, LDLDIRECT in the last 72 hours. Thyroid Function Tests: No results for input(s): TSH, T4TOTAL, FREET4, T3FREE, THYROIDAB in the last 72 hours. Anemia Panel: No results for input(s): VITAMINB12, FOLATE, FERRITIN, TIBC, IRON, RETICCTPCT in the last 72 hours. Sepsis Labs: No results for input(s): PROCALCITON, LATICACIDVEN in the last 168 hours.   Recent Results (from the past 240 hour(s))  Resp Panel by RT-PCR (Flu A&B, Covid) Nasopharyngeal Swab     Status: None   Collection Time: 02/07/21 11:10 PM   Specimen: Nasopharyngeal Swab; Nasopharyngeal(NP) swabs in vial transport medium  Result Value Ref Range Status   SARS Coronavirus 2 by RT PCR NEGATIVE NEGATIVE Final    Comment: (NOTE) SARS-CoV-2 target nucleic acids are NOT DETECTED.  The SARS-CoV-2 RNA is generally detectable in upper respiratory specimens during the acute phase of infection. The lowest concentration of SARS-CoV-2 viral copies this assay can detect is 138 copies/mL. A negative result does not preclude SARS-Cov-2 infection and should not be used as the sole basis for treatment or other patient management  decisions. A negative result may occur with  improper specimen collection/handling, submission of specimen other than nasopharyngeal swab, presence of viral mutation(s) within the areas targeted by this assay, and inadequate number of viral copies(<138 copies/mL). A negative result must be combined with clinical observations, patient history, and epidemiological information. The expected result is Negative.  Fact Sheet for Patients:  EntrepreneurPulse.com.au  Fact Sheet for Healthcare Providers:  IncredibleEmployment.be  This test is no t yet approved or cleared by the [place] FDA and  has been authorized for detection and/or diagnosis of SARS-CoV-2 by FDA under an Emergency Use Authorization (EUA). This EUA will remain  in effect (meaning this test can be used) for the duration of the COVID-19 declaration under Section 564(b)(1) of the Act, 21 U.S.C.section 360bbb-3(b)(1), unless the authorization is terminated  or revoked sooner.       Influenza A by PCR NEGATIVE NEGATIVE Final   Influenza B by PCR NEGATIVE NEGATIVE Final    Comment: (NOTE) The Xpert Xpress SARS-CoV-2/FLU/RSV plus assay is intended as an aid in the diagnosis of influenza from Nasopharyngeal swab specimens and should not be used as a sole basis for treatment. Nasal washings and aspirates are unacceptable for Xpert Xpress SARS-CoV-2/FLU/RSV testing.  Fact Sheet for Patients: EntrepreneurPulse.com.au  Fact Sheet for Healthcare Providers: IncredibleEmployment.be  This test is not yet approved or cleared by the [place] FDA and has been authorized for detection and/or diagnosis of SARS-CoV-2 by FDA under an Emergency Use Authorization (EUA). This EUA will remain in effect (meaning this test can be used) for the duration of the COVID-19 declaration under Section 564(b)(1) of the Act, 21 U.S.C. section 360bbb-3(b)(1), unless the  authorization is terminated or revoked.  Performed at [facility], [address] [place], [place] [address]   Blood Culture (routine x 2)     Status: None   Collection Time: 02/07/21 11:10 PM   Specimen: BLOOD  Result Value Ref Range Status   Specimen Description BLOOD LEFT FOREARM  Final   Special Requests   Final    BOTTLES DRAWN AEROBIC AND ANAEROBIC Blood Culture results may not be optimal due to an excessive volume of blood received in culture bottles   Culture   Final    NO GROWTH 5 DAYS Performed at [facility], [address], [place], [place] [address]    Report Status 02/12/2021 FINAL  Final  Blood Culture (routine x 2)     Status: None   Collection Time: 02/07/21 11:10 PM   Specimen: BLOOD  Result Value Ref Range Status   Specimen Description BLOOD LEFT ASSIST CONTROL  Final   Special Requests   Final    BOTTLES DRAWN AEROBIC AND ANAEROBIC Blood Culture adequate volume   Culture   Final    NO GROWTH 5 DAYS Performed at [facility], [address] [place], [place] [address]    Report Status 02/12/2021 FINAL  Final  Urine Culture     Status: None   Collection Time: 02/07/21 11:10 PM   Specimen: In/Out Cath Urine  Result Value Ref Range Status   Specimen Description   Final    IN/OUT CATH URINE Performed at [facility], [address], [place], [place] [address]    Special Requests   Final    NONE Performed at [facility], [address], [place], [place] [address]    Culture   Final    NO GROWTH Performed at [facility], [place] [address] [place], [place] [address]    Report Status 02/09/2021 FINAL  Final  Aerobic/Anaerobic Culture w Gram Stain (surgical/deep wound)     Status: None   Collection Time: 02/08/21  7:55 AM   Specimen: PATH Bone resection; Tissue  Result Value Ref Range Status   Specimen Description   Final    BONE LEFT CALCANEUS Performed at [facility], [address], [place], [place] [address]    Special Requests   Final    NONE Performed at [facility], [place], [place] [address]    Gram Stain RARE WBC SEEN FEW GRAM POSITIVE COCCI   Final   Culture   Final    MODERATE ENTEROCOCCUS FAECALIS RARE STAPHYLOCOCCUS EPIDERMIDIS MODERATE FINEGOLDIA MAGNA Standardized susceptibility testing for this organism is not available. Performed at [facility], [place] [address], [place], [place] [address]    Report Status 02/12/2021 FINAL  Final   Organism ID, Bacteria ENTEROCOCCUS FAECALIS  Final   Organism ID, Bacteria STAPHYLOCOCCUS EPIDERMIDIS  Final      Susceptibility   Enterococcus faecalis - MIC*    AMPICILLIN <=2 SENSITIVE Sensitive     VANCOMYCIN 1 SENSITIVE Sensitive     GENTAMICIN SYNERGY RESISTANT Resistant     * MODERATE ENTEROCOCCUS FAECALIS   Staphylococcus epidermidis - MIC*    CIPROFLOXACIN 4 RESISTANT Resistant     ERYTHROMYCIN >=8 RESISTANT Resistant     GENTAMICIN <=0.5 SENSITIVE Sensitive     OXACILLIN <=0.25 SENSITIVE Sensitive     TETRACYCLINE >=16 RESISTANT Resistant     VANCOMYCIN 1 SENSITIVE Sensitive     TRIMETH/SULFA <=10 SENSITIVE Sensitive     CLINDAMYCIN <=0.25 SENSITIVE Sensitive     RIFAMPIN <=0.5 SENSITIVE Sensitive     Inducible Clindamycin NEGATIVE Sensitive     * RARE STAPHYLOCOCCUS EPIDERMIDIS  Surgical pcr screen     Status: None   Collection Time: 02/13/21  1:56 PM   Specimen: Nasal Mucosa; Nasal Swab  Result Value Ref Range Status   MRSA, PCR NEGATIVE NEGATIVE Final   Staphylococcus aureus NEGATIVE NEGATIVE Final    Comment: (NOTE) The Xpert SA Assay (FDA approved for NASAL specimens in patients 57 years of age and older), is one component of a comprehensive surveillance program. It  is not intended to diagnose infection nor to guide or monitor treatment. Performed at [facility], [address], [place], [place] [address]           Radiology  Studies: [place] EKG SITE RITE  Result Date: 02/14/2021 If [facility] image not attached, placement could not be confirmed due to current cardiac rhythm.       Scheduled Meds:  acidophilus  1 capsule Oral Daily   vitamin C  500 mg Oral BID   carvedilol  3.125 mg Oral BID WC   docusate sodium  100 mg Oral BID   enoxaparin (LOVENOX) injection  0.5 mg/kg Subcutaneous Q24H   fenofibrate  54 mg Oral Daily   insulin aspart  0-20 Units Subcutaneous TID WC   insulin aspart  0-5 Units Subcutaneous QHS   insulin aspart  3 Units Subcutaneous TID WC   insulin glargine-yfgn  25 Units Subcutaneous Daily   lisinopril  5 mg Oral BID   montelukast  10 mg Oral QHS   multivitamin with minerals  1 tablet Oral Daily   nicotine  14 mg Transdermal Daily   nutrition supplement (JUVEN)  1 packet Oral BID BM   ondansetron (ZOFRAN) IV  4 mg Intravenous Once   pantoprazole  40 mg Oral Daily   pravastatin  40 mg Oral QHS   Ensure Max Protein  11 oz Oral QHS   saccharomyces boulardii  250 mg Oral BID   venlafaxine XR  37.5 mg Oral Q breakfast   zinc sulfate  220 mg Oral Daily   Continuous Infusions:  sodium chloride 10 mL/hr at 02/10/21 0302   ampicillin-sulbactam (UNASYN) IV 3 g (02/15/21 0630)          Aline August, MD [facility] 02/15/2021, 7:35 AM

## 2021-02-15 NOTE — TOC Progression Note (Addendum)
Transition of Care [facility]) - Progression Note    Patient Details  Name: Nancy [name] MRN: [id number] Date of Birth: [birth year]  Transition of Care [facility]) CM/SW Contact  Marlowe Sax, RN Phone Number: 02/15/2021, 8:44 AM  Clinical Narrative:    Will need 6 weeks of IV Unasyn- end date 03/26/21 Spoke with the patient she will be going to her mothers house at [address] [place] [address], She has a 3 in 1, a RW, a scooter, and the wound vac to go home is at the bedside charging, Will need [facility] Nursing only, She has transportation and can afford her medications I contacted [facility], they are not able to accept the patient        Expected Discharge Plan and Services                                                 Social Determinants of Health (SDOH) Interventions    Readmission Risk Interventions No flowsheet data found.

## 2021-02-15 NOTE — TOC Progression Note (Addendum)
Transition of Care [facility]) - Progression Note    Patient Details  Name: RICHA SHOR MRN: [id number] Date of Birth: 27-Jan-1965  Transition of Care [facility]) CM/SW Contact  Marlowe Sax, RN Phone Number: 02/15/2021, 9:14 AM  Clinical Narrative:   I reached out to [facility] and requested to accept the patient, I reached out to [facility] and requested them to accept the patient I reached out to [facility], [place], [place], [facility], [facility], and [facility] is not able to accept the patient, [facility] can not accept the patient Awaiting to hear from others  [facility] can do start of Care as early as Monday I notified the physician   Expected Discharge Plan: Home w Home Health Services Barriers to Discharge: Continued Medical Work up  Expected Discharge Plan and Services Expected Discharge Plan: Home w Home Health Services   Discharge Planning Services: CM Consult   Living arrangements for the past 2 months: Single Family Home                 DME Arranged: N/A         HH Arranged: RN           Social Determinants of Health (SDOH) Interventions    Readmission Risk Interventions No flowsheet data found.

## 2021-02-15 NOTE — Progress Notes (Signed)
PT Cancellation Note  Patient Details Name: Nancy [name] MRN: [id number] DOB: 03/04/1965   Cancelled Treatment:    Reason Eval/Treat Not Completed: PT screened, no needs identified, will sign off New order placed after repeat I&D and placement of wound vac.  Chart reviewed and spoke with pt/family.  She reports nothing has changed regarding mobility since she was seen, evaluated, and cleared by PT 2/4. Nursing reports she "does fine" with transfers (BSC, etc) and maintaining NWBing. She has been using the knee scooter since November, has a good idea of her limitations, how to manage in the home and will have 24/7 assist. In light of this neither she nor I feel the need for another formal PT exam.  We did discuss wound vac management and while she notes that it's added some trickiness to mobility she has been able to manage confidently.  Case discussed with attending/ordering MD, will complete PT orders.  Malachi Pro, DPT 02/15/2021, 4:22 PM

## 2021-02-15 NOTE — Consult Note (Signed)
WOC involved in secure chat with podiatrist and CM.  Patient will have NPWT dressing changed by podiatrist tomorrow (2/10).  WOC nurse team will follow up Monday if patient still inpatient to determine needs for NPWT dressing change.   Kastin Cerda [facility], CNS, [employer] [phone]

## 2021-02-15 NOTE — Progress Notes (Signed)
Peripherally Inserted Central Catheter Placement  The IV Nurse has discussed with the patient and/or persons authorized to consent for the patient, the purpose of this procedure and the potential benefits and risks involved with this procedure.  The benefits include less needle sticks, lab draws from the catheter, and the patient may be discharged home with the catheter. Risks include, but not limited to, infection, bleeding, blood clot (thrombus formation), and puncture of an artery; nerve damage and irregular heartbeat and possibility to perform a PICC exchange if needed/ordered by physician.  Alternatives to this procedure were also discussed.  Bard Power PICC patient education guide, fact sheet on infection prevention and patient information card has been provided to patient /or left at bedside.  Telephone consent obtained by staff nurse from spouse d/t pt post op.  Verbally explained PICC procedure to pt at bedside, in agreement and understanding of spousal consent.  PICC Placement Documentation  PICC Single Lumen 02/15/21 Right Basilic 40 cm 0 cm (Active)  Indication for Insertion or Continuance of Line Home intravenous therapies (PICC only) 02/15/21 1341  Exposed Catheter (cm) 0 cm 02/15/21 1341  Site Assessment Clean;Dry;Intact 02/15/21 1341  Line Status Flushed;Saline locked;Blood return noted 02/15/21 1341  Dressing Type Transparent;Securing device 02/15/21 1341  Dressing Status Clean;Intact;Dry 02/15/21 1341  Antimicrobial disc in place? Yes 02/15/21 1341  Safety Lock Not Applicable 02/15/21 1341  Line Care Connections checked and tightened;Tubing changed 02/15/21 1341  Line Adjustment (NICU/IV Team Only) No 02/15/21 1341  Dressing Intervention New dressing 02/15/21 1341  Dressing Change Due 02/22/21 02/15/21 1341       Nancy [name] 02/15/2021, 1:42 PM

## 2021-02-15 NOTE — Progress Notes (Signed)
Secure chat sent to [facility] and [facility] LPN that PICC is ready for use and to remove the PIV.

## 2021-02-16 LAB — GLUCOSE, CAPILLARY
Glucose-Capillary: 173 mg/dL — ABNORMAL HIGH (ref 70–99)
Glucose-Capillary: 203 mg/dL — ABNORMAL HIGH (ref 70–99)
Glucose-Capillary: 288 mg/dL — ABNORMAL HIGH (ref 70–99)
Glucose-Capillary: 146 mg/dL — ABNORMAL HIGH (ref 70–99)

## 2021-02-16 LAB — SURGICAL PATHOLOGY

## 2021-02-16 MED ORDER — POLYETHYLENE GLYCOL 3350 17 G PO PACK: 17.0000 g | PACK | Freq: Every day | ORAL | 0 refills | Status: DC | PRN

## 2021-02-16 MED ORDER — DOCUSATE SODIUM 100 MG PO CAPS: 100.0000 mg | ORAL_CAPSULE | Freq: Two times a day (BID) | ORAL | 0 refills | Status: DC

## 2021-02-16 MED ORDER — ZINC SULFATE 220 (50 ZN) MG PO CAPS: 220.0000 mg | ORAL_CAPSULE | Freq: Every day | ORAL | 0 refills | Status: AC

## 2021-02-16 MED ORDER — NYSTATIN 100000 UNIT/GM EX POWD: 1.0000 "application " | Freq: Every day | CUTANEOUS | Status: DC | PRN

## 2021-02-16 MED ORDER — AMPICILLIN-SULBACTAM IV (FOR PTA / DISCHARGE USE ONLY): 3.0000 g | Freq: Four times a day (QID) | INTRAVENOUS | 0 refills | Status: AC

## 2021-02-16 MED ORDER — ASCORBIC ACID 500 MG PO TABS: 500.0000 mg | ORAL_TABLET | Freq: Two times a day (BID) | ORAL | 0 refills | Status: DC

## 2021-02-16 NOTE — Progress Notes (Signed)
Daily Progress Note   Subjective  - 2 Days Post-Op  Follow-up of left heel debridement with Achilles tendon debridement wound VAC placement and bone biopsy.  States pain is under control with pain medication at this time.  Objective Vitals:   02/15/21 1734 02/15/21 2006 02/16/21 0434 02/16/21 0810  BP: (!) 146/74 (!) 143/72 137/85 116/72  Pulse: 82 77 71 77  Resp:  17 16 16   Temp:  99.4 F (37.4 C) 98.3 F (36.8 C)   TempSrc:      SpO2:  96% 97% 100%  Weight:      Height:        Physical Exam: See clinical picture.  Large 9 x 5 cm full-thickness ulcer with exposed posterior calcaneus.  Mixed fibrotic tissue on the posterior aspect of the heel region.  The Achilles debridement site is healing well with no active purulent drainage.  The surrounding erythema has improved.  See clinical picture     Laboratory CBC    Component Value Date/Time   WBC 9.9 02/13/2021 0323   HGB 10.0 (L) 02/13/2021 0323   HGB 14.8 11/04/2016 1109   HCT 31.4 (L) 02/13/2021 0323   HCT 46.1 11/04/2016 1109   PLT 572 (H) 02/13/2021 0323   PLT 265 11/04/2016 1109    BMET    Component Value Date/Time   NA 135 02/13/2021 0323   NA 134 11/04/2016 1109   NA 133 (L) 08/21/2012 1523   K 3.8 02/13/2021 0323   K 4.1 08/21/2012 1523   CL 98 02/13/2021 0323   CL 102 08/21/2012 1523   CO2 29 02/13/2021 0323   CO2 26 08/21/2012 1523   GLUCOSE 246 (H) 02/13/2021 0323   GLUCOSE 306 (H) 08/21/2012 1523   BUN 12 02/13/2021 0323   BUN 16 11/04/2016 1109   BUN 17 08/21/2012 1523   CREATININE 0.65 02/13/2021 0323   CREATININE 0.69 08/21/2012 1523   CALCIUM 8.5 (L) 02/13/2021 0323   CALCIUM 8.8 08/21/2012 1523   GFRNONAA >60 02/13/2021 0323   GFRNONAA >60 08/21/2012 1523   GFRAA 117 11/04/2016 1109   GFRAA >60 08/21/2012 1523    Assessment/Planning: Status post debridement with osteomyelitis left calcaneus Diabetic foot ulceration  The foot looks to be stable at this point.  Biopsy of the  calcaneus does show acute osteomyelitis.  Will need long-term IV antibiotics. No further debridement needed at this time.  I would recommend continuing with the wound VAC for the next several weeks.  Continue with local wound care and she is scheduled for IV antibiotics and home care through home health nursing.  I will see her in 2 weeks in my office. I have discussed this case briefly with plastic surgery and she will need referral upon discharge to plastic surgery in [place]..  Dr. Waterford has agreed to see in consultation in her outpatient clinic.  Please schedule appointment with her upon discharge. Patient is stable from podiatry standpoint for discharge  Nancy [name]  02/16/2021, 11:45 AM

## 2021-02-16 NOTE — Progress Notes (Signed)
PHARMACY CONSULT NOTE FOR:  OUTPATIENT  PARENTERAL ANTIBIOTIC THERAPY (OPAT)  Indication: Diabetic foot infection with osteomyelitis Regimen: ampicillin/sulbactam 3gm IV q6h End date: 03/22/2021    IV antibiotic discharge orders are pended. To discharging provider:  please sign these orders via discharge navigator,  Select New Orders & click on the button choice - Manage This Unsigned Work.     Thank you for allowing pharmacy to be a part of this patient's care.  Juliette Alcide, PharmD, BCPS, BCIDP Work Cell: [phone] 02/16/2021 12:07 PM

## 2021-02-16 NOTE — TOC Progression Note (Addendum)
Transition of Care [facility]) - Progression Note    Patient Details  Name: Nancy [name] MRN: [id number] Date of Birth: 1965/08/05  Transition of Care [facility]) CM/SW Contact  Liliana Cline, LCSW Phone Number: 02/16/2021, 1:04 PM  Clinical Narrative:   Notified Meg with [facility] that patient is planned to DC tomorrow, Meg confirmed this is fine and HHRN will start Monday.  Notified Pam with [facility] who stated she has been in touch with patient and her friend for teaching and everything is fine for DC tomorrow.   2:40- Call from [facility] with [facility] asking for [facility] orders for PT and RN. Notified MD.  Expected Discharge Plan: Home w Home Health Services Barriers to Discharge: Continued Medical Work up  Expected Discharge Plan and Services Expected Discharge Plan: Home w Home Health Services   Discharge Planning Services: CM Consult   Living arrangements for the past 2 months: Single Family Home                 DME Arranged: N/A         HH Arranged: RN HH Agency: [facility] Date HH Agency Contacted: 02/15/21 Time HH Agency Contacted: 1024 Representative spoke with at [facility] Agency: Meg   Social Determinants of Health (SDOH) Interventions    Readmission Risk Interventions No flowsheet data found.

## 2021-02-16 NOTE — Plan of Care (Signed)

## 2021-02-16 NOTE — Progress Notes (Signed)
Inpatient Diabetes Program Recommendations  AACE/ADA: New Consensus Statement on Inpatient Glycemic Control   Target Ranges:  Prepandial:   less than 140 mg/dL      Peak postprandial:   less than 180 mg/dL (1-2 hours)      Critically ill patients:  140 - 180 mg/dL    Latest Reference Range & Units 02/15/21 07:35 02/15/21 11:26 02/15/21 15:17 02/15/21 17:03 02/15/21 21:39  Glucose-Capillary 70 - 99 mg/dL 174 (H) 944 (H) 967 (H) 208 (H) 210 (H)   Review of Glycemic Control  Diabetes history: DM2 Outpatient Diabetes medications: Metformin 1500 mg AM, Metformin 1000 mg QPM, Mounjaro 5 mg Qweek, Novolin 70/30 70 units BID Current orders for Inpatient glycemic control: Semglee 25 units daily, Novolog 0-20 units TID with meals, Novolog 0-5 units QHS, Novolog 5 units TID with meals  Inpatient Diabetes Program Recommendations:    Insulin: If post prandial glucose continues to be consistently over 180 mg/dl, please consider increasing meal coverage to Novolog 8 units TID with meals.  Thanks, Orlando Penner, RN, MSN, CDE Diabetes Coordinator Inpatient Diabetes Program [phone] (Team Pager from 8am to 5pm)

## 2021-02-16 NOTE — Progress Notes (Addendum)
Date of Admission:  02/07/2021     ID: Nancy [name] is a 56 y.o. female  Principal Problem:   Diabetic foot ulcer ([place]) Active Problems:   Essential hypertension   Diabetes mellitus, type 2 ([place])   Sepsis secondary to diabetic foot infection   Tobacco abuse   Hyponatremia    Subjective: Patient doing fine Had vac change today   Medications:   acidophilus  1 capsule Oral Daily   vitamin C  500 mg Oral BID   carvedilol  3.125 mg Oral BID WC   Chlorhexidine Gluconate Cloth  6 each Topical Daily   docusate sodium  100 mg Oral BID   enoxaparin (LOVENOX) injection  0.5 mg/kg Subcutaneous Q24H   fenofibrate  54 mg Oral Daily   insulin aspart  0-20 Units Subcutaneous TID WC   insulin aspart  0-5 Units Subcutaneous QHS   insulin aspart  5 Units Subcutaneous TID WC   insulin glargine-yfgn  25 Units Subcutaneous Daily   lisinopril  5 mg Oral BID   montelukast  10 mg Oral QHS   multivitamin with minerals  1 tablet Oral Daily   nicotine  14 mg Transdermal Daily   nutrition supplement (JUVEN)  1 packet Oral BID BM   ondansetron (ZOFRAN) IV  4 mg Intravenous Once   pantoprazole  40 mg Oral Daily   pravastatin  40 mg Oral QHS   Ensure Max Protein  11 oz Oral QHS   saccharomyces boulardii  250 mg Oral BID   sodium chloride flush  10-40 mL Intracatheter Q12H   venlafaxine XR  37.5 mg Oral Q breakfast   zinc sulfate  220 mg Oral Daily    Objective: Vital signs in last 24 hours: Temp:  [98.3 F (36.8 C)-99.5 F (37.5 C)] 98.3 F (36.8 C) (02/10 0434) Pulse Rate:  [71-82] 77 (02/10 0810) Resp:  [16-18] 16 (02/10 0810) BP: (116-152)/(65-85) 116/72 (02/10 0810) SpO2:  [96 %-100 %] 100 % (02/10 0810)  PHYSICAL EXAM:  General: Alert, cooperative, no distress,  Lungs: Clear to auscultation bilaterally. No Wheezing or Rhonchi. No rales. Heart: Regular rate and rhythm, no murmur, rub or gallop. Abdomen: Soft, non-tender,not distended. Bowel sounds normal. No  masses Extremities: Left foot has wound VAC on the heel 02/16/21     02/08/21   Skin: No rashes or lesions. Or bruising Lymph: Cervical, supraclavicular normal. Neurologic: Grossly non-focal  Lab Results No results for input(s): WBC, HGB, HCT, NA, K, CL, CO2, BUN, CREATININE, GLU in the last 72 hours.  Invalid input(s): PLATELETS    Microbiology: Left heel wound culture Enterococcus faecalis Staph epidermidis Fingoldia magna Studies/Results: [place] EKG SITE RITE  Result Date: 02/15/2021 If Site Rite image not attached, placement could not be confirmed due to current cardiac rhythm.  [place] EKG SITE RITE  Result Date: 02/14/2021 If [facility] image not attached, placement could not be confirmed due to current cardiac rhythm.    Assessment/Plan: Diabetic foot infection with necrotic wound with osteomyelitis of the left heel.  Status post debridement and partial excision of the calcaneal bone.    Wound culture had Enterococcus faecalis., finegoldia magna  Staph epi is a contaminant. On unasyn .  Will need 6 weeks of IV Unasyn- end date 03/26/21 She will follow with Dr.Fowler- also will be seeing plastic surgery-   Diabetes mellitus on insulin and metformin  Penicillin allergy as a child but has tolerated Unasyn very well  Discussed the management with the patient and  her wife and Dr.Fowler F/u as OP with me ID will sign off- call if needed     OPAT Orders Discharge antibiotics: Unasyn 3 grams IV every 6 hours for total of 6 weeks Until 03/26/21 Will need augmentin  PO 826m BID after she completes IV   [facility]   Labs weekly while on IV antibiotics: _X_ CBC with differential   _X_ CMP   _X_ ESR     _X_ Please pull PIC at completion of IV antibiotics     Fax weekly labs to [phone]  Clinic Follow Up Appt: with Dr.Ravishankar3/30/23 at 8.30am     Call [phone] any concerns , critical labs

## 2021-02-16 NOTE — Progress Notes (Signed)
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  She was started on broad-spectrum antibiotics.  Podiatry was consulted.  She underwent surgical intervention by podiatry on 02/08/2021: Wound cultures grew Enterococcus faecalis; antibiotics changed to IV Unasyn by ID.  She underwent further surgical intervention on 02/14/2021.  Assessment & Plan:   Acute left foot osteomyelitis/diabetic foot ulcer infection -Status post excisional debridement arthrotomy and I&D on 02/08/2021 by podiatry.  Wound cultures grew Enterococcus faecalis and Staph epidermidis: ID subsequently changed ID antibiotics to IV Unasyn considering Staph epidermidis as contaminant.  Will need 6 weeks of IV Unasyn till 03/20/2021. -Status post repeat I&D with placement of wound VAC on 02/14/2021 by podiatry.  Follow further recommendations from podiatry.  Continue pain management.  Sepsis: Present on admission -Resolved  Diabetes mellitus type 2 uncontrolled with hyperglycemia -Continue long-acting insulin along with short-acting insulin along with CBGs with SSI  Nausea and vomiting: Resolved -Possibly from diabetic gastroparesis.  Currently tolerating diet  Hyponatremia -Resolved  Leukocytosis -Resolved  Anemia of chronic disease -Possibly from chronic illnesses.  Hemoglobin stable  Thrombocytosis -Possibly reactive  Tobacco abuse -Continue nicotine patch  Essential hypertension -Blood pressure currently stable.  Continue Coreg and lisinopril.  Hyperlipidemia -Continue statin  Obesity  -outpatient follow-up    DVT prophylaxis: Lovenox Code Status: Full Family  Communication: Significant other at bedside on 02/14/2021 Disposition Plan: Status is: Inpatient Remains inpatient appropriate because: Of severity of illness.  Need for IV antibiotics  Consultants: Podiatry/ID  Procedures: As above  Antimicrobials:  Anti-infectives (From admission, onward)    Start     Dose/Rate Route Frequency Ordered Stop   02/09/21 1630  Ampicillin-Sulbactam (UNASYN) 3 g in sodium chloride 0.9 % 100 mL IVPB        3 g 200 mL/hr over 30 Minutes Intravenous Every 6 hours 02/09/21 1522     02/08/21 1300  vancomycin (VANCOCIN) IVPB 1000 mg/200 mL premix  Status:  Discontinued        1,000 mg 200 mL/hr over 60 Minutes Intravenous Every 12 hours 02/08/21 0054 02/12/21 1631   02/08/21 0824  vancomycin (VANCOCIN) powder  Status:  Discontinued          As needed 02/08/21 0824 02/08/21 0836   02/08/21 0800  metroNIDAZOLE (FLAGYL) IVPB 500 mg  Status:  Discontinued        500 mg 100 mL/hr over 60 Minutes Intravenous Every 8 hours 02/08/21 0021 02/09/21 1522   02/08/21 0700  ceFEPIme (MAXIPIME) 2 g in sodium chloride 0.9 % 100 mL IVPB  Status:  Discontinued        2 g 200 mL/hr over 30 Minutes Intravenous Every 8 hours 02/08/21 0051 02/09/21 1522   02/08/21 0600  clindamycin (CLEOCIN) IVPB 900 mg  Status:  Discontinued        900 mg 100 mL/hr over 30 Minutes Intravenous On call to O.R. 02/08/21 0022 02/08/21 1257   02/08/21 0100  vancomycin (VANCOREADY) IVPB 1500 mg/300 mL        1,500 mg 150 mL/hr over 120 Minutes Intravenous  Once 02/08/21 0050 02/08/21 0416   02/07/21 2145  ceFEPIme (MAXIPIME) 2 g in sodium chloride  0.9 % 100 mL IVPB        2 g 200 mL/hr over 30 Minutes Intravenous  Once 02/07/21 2134 02/08/21 0027   02/07/21 2145  metroNIDAZOLE (FLAGYL) IVPB 500 mg        500 mg 100 mL/hr over 60 Minutes Intravenous  Once 02/07/21 2134 02/08/21 0109   02/07/21 2145  vancomycin (VANCOCIN) IVPB 1000 mg/200 mL premix        1,000 mg 200 mL/hr over 60 Minutes  Intravenous  Once 02/07/21 2134 02/08/21 0133        Subjective: Patient seen and examined at bedside.  Denies worsening lower extremity pain, shortness of breath, nausea, vomiting.   Objective: Vitals:   02/15/21 1525 02/15/21 1734 02/15/21 2006 02/16/21 0434  BP: (!) 152/65 (!) 146/74 (!) 143/72 137/85  Pulse: 79 82 77 71  Resp: 18  17 16   Temp: 99.5 F (37.5 C)  99.4 F (37.4 C) 98.3 F (36.8 C)  TempSrc: Oral     SpO2: 98%  96% 97%  Weight:      Height:        Intake/Output Summary (Last 24 hours) at 02/16/2021 0752 Last data filed at 02/15/2021 1411 Gross per 24 hour  Intake 200 ml  Output 1 ml  Net 199 ml    Filed Weights   02/07/21 2109 02/08/21 0624 02/14/21 1335  Weight: 109.3 kg 109.3 kg 109.3 kg    Examination:  General exam: Currently on room air.  No acute distress.   Respiratory system: Bilateral respirations at bases with some scattered crackles  cardiovascular system: S1-S2 heard; rate controlled  gastrointestinal system: Abdomen is obese, mildly distended; soft and nontender.  Normal bowel sounds heard  extremities: No cyanosis; left foot still has dressing present along with wound VAC  Data Reviewed: I have personally reviewed following labs and imaging studies  CBC: Recent Labs  Lab 02/11/21 0414 02/13/21 0323  WBC 8.8 9.9  NEUTROABS 5.2 6.3  HGB 10.1* 10.0*  HCT 31.6* 31.4*  MCV 92.9 91.5  PLT 493* 572*    Basic Metabolic Panel: Recent Labs  Lab 02/10/21 0408 02/11/21 0414 02/13/21 0323  NA  --  135 135  K  --  3.6 3.8  CL  --  99 98  CO2  --  28 29  GLUCOSE  --  255* 246*  BUN  --  11 12  CREATININE 0.65 0.62 0.65  CALCIUM  --  8.5* 8.5*    GFR: Estimated Creatinine Clearance: 103.4 mL/min (by C-G formula based on SCr of 0.65 mg/dL). Liver Function Tests: Recent Labs  Lab 02/11/21 0414  AST 10*  ALT 11  ALKPHOS 57  BILITOT 0.4  PROT 5.7*  ALBUMIN 1.8*    No results for input(s): LIPASE, AMYLASE in the last 168  hours. No results for input(s): AMMONIA in the last 168 hours. Coagulation Profile: No results for input(s): INR, PROTIME in the last 168 hours.  Cardiac Enzymes: No results for input(s): CKTOTAL, CKMB, CKMBINDEX, TROPONINI in the last 168 hours. BNP (last 3 results) No results for input(s): PROBNP in the last 8760 hours. HbA1C: No results for input(s): HGBA1C in the last 72 hours. CBG: Recent Labs  Lab 02/15/21 1126 02/15/21 1517 02/15/21 1703 02/15/21 2139 02/16/21 0743  GLUCAP 249* 240* 208* 210* 173*    Lipid Profile: No results for input(s): CHOL, HDL, LDLCALC, TRIG, CHOLHDL, LDLDIRECT in the last 72 hours. Thyroid Function Tests: No results for input(s): TSH, T4TOTAL, FREET4,  T3FREE, THYROIDAB in the last 72 hours. Anemia Panel: No results for input(s): VITAMINB12, FOLATE, FERRITIN, TIBC, IRON, RETICCTPCT in the last 72 hours. Sepsis Labs: No results for input(s): PROCALCITON, LATICACIDVEN in the last 168 hours.   Recent Results (from the past 240 hour(s))  Resp Panel by RT-PCR (Flu A&B, Covid) Nasopharyngeal Swab     Status: None   Collection Time: 02/07/21 11:10 PM   Specimen: Nasopharyngeal Swab; Nasopharyngeal(NP) swabs in vial transport medium  Result Value Ref Range Status   SARS Coronavirus 2 by RT PCR NEGATIVE NEGATIVE Final    Comment: (NOTE) SARS-CoV-2 target nucleic acids are NOT DETECTED.  The SARS-CoV-2 RNA is generally detectable in upper respiratory specimens during the acute phase of infection. The lowest concentration of SARS-CoV-2 viral copies this assay can detect is 138 copies/mL. A negative result does not preclude SARS-Cov-2 infection and should not be used as the sole basis for treatment or other patient management decisions. A negative result may occur with  improper specimen collection/handling, submission of specimen other than nasopharyngeal swab, presence of viral mutation(s) within the areas targeted by this assay, and inadequate  number of viral copies(<138 copies/mL). A negative result must be combined with clinical observations, patient history, and epidemiological information. The expected result is Negative.  Fact Sheet for Patients:  BloggerCourse.com  Fact Sheet for Healthcare Providers:  SeriousBroker.it  This test is no t yet approved or cleared by the [place] FDA and  has been authorized for detection and/or diagnosis of SARS-CoV-2 by FDA under an Emergency Use Authorization (EUA). This EUA will remain  in effect (meaning this test can be used) for the duration of the COVID-19 declaration under Section 564(b)(1) of the Act, 21 U.S.C.section 360bbb-3(b)(1), unless the authorization is terminated  or revoked sooner.       Influenza A by PCR NEGATIVE NEGATIVE Final   Influenza B by PCR NEGATIVE NEGATIVE Final    Comment: (NOTE) The Xpert Xpress SARS-CoV-2/FLU/RSV plus assay is intended as an aid in the diagnosis of influenza from Nasopharyngeal swab specimens and should not be used as a sole basis for treatment. Nasal washings and aspirates are unacceptable for Xpert Xpress SARS-CoV-2/FLU/RSV testing.  Fact Sheet for Patients: BloggerCourse.com  Fact Sheet for Healthcare Providers: SeriousBroker.it  This test is not yet approved or cleared by the [place] FDA and has been authorized for detection and/or diagnosis of SARS-CoV-2 by FDA under an Emergency Use Authorization (EUA). This EUA will remain in effect (meaning this test can be used) for the duration of the COVID-19 declaration under Section 564(b)(1) of the Act, 21 U.S.C. section 360bbb-3(b)(1), unless the authorization is terminated or revoked.  Performed at [facility], [address] [place], [place] [address]   Blood Culture (routine x 2)     Status: None   Collection Time: 02/07/21 11:10 PM   Specimen: BLOOD   Result Value Ref Range Status   Specimen Description BLOOD LEFT FOREARM  Final   Special Requests   Final    BOTTLES DRAWN AEROBIC AND ANAEROBIC Blood Culture results may not be optimal due to an excessive volume of blood received in culture bottles   Culture   Final    NO GROWTH 5 DAYS Performed at [facility], [address] [place], [place] [address]    Report Status 02/12/2021 FINAL  Final  Blood Culture (routine x 2)     Status: None   Collection Time: 02/07/21 11:10 PM   Specimen: BLOOD  Result Value Ref Range Status   Specimen Description BLOOD LEFT ASSIST CONTROL  Final   Special Requests   Final    BOTTLES DRAWN AEROBIC AND ANAEROBIC Blood Culture adequate volume   Culture   Final    NO GROWTH 5 DAYS Performed at [facility], [address] [place], [place] [address]    Report Status 02/12/2021 FINAL  Final  Urine Culture     Status: None   Collection Time: 02/07/21 11:10 PM   Specimen: In/Out Cath Urine  Result Value Ref Range Status   Specimen Description   Final    IN/OUT CATH URINE Performed at [facility], [address] [place], [place] [address]    Special Requests   Final    NONE Performed at [facility], [address] [place], [place] [address]    Culture   Final    NO GROWTH Performed at [facility], [address] [place], [place] [address]    Report Status 02/09/2021 FINAL  Final  Aerobic/Anaerobic Culture w Gram Stain (surgical/deep wound)     Status: None   Collection Time: 02/08/21  7:55 AM   Specimen: PATH Bone resection; Tissue  Result Value Ref Range Status   Specimen Description   Final    BONE LEFT CALCANEUS Performed at [facility], [address] [place], [place] [address]    Special Requests   Final    NONE Performed at [facility], [address] [place], [place] [address]    Gram Stain RARE WBC SEEN FEW GRAM POSITIVE COCCI   Final   Culture   Final     MODERATE ENTEROCOCCUS FAECALIS RARE STAPHYLOCOCCUS EPIDERMIDIS MODERATE FINEGOLDIA MAGNA Standardized susceptibility testing for this organism is not available. Performed at [facility], [address] [place], [place] [address]    Report Status 02/12/2021 FINAL  Final   Organism ID, Bacteria ENTEROCOCCUS FAECALIS  Final   Organism ID, Bacteria STAPHYLOCOCCUS EPIDERMIDIS  Final      Susceptibility   Enterococcus faecalis - MIC*    AMPICILLIN <=2 SENSITIVE Sensitive     VANCOMYCIN 1 SENSITIVE Sensitive     GENTAMICIN SYNERGY RESISTANT Resistant     * MODERATE ENTEROCOCCUS FAECALIS   Staphylococcus epidermidis - MIC*    CIPROFLOXACIN 4 RESISTANT Resistant     ERYTHROMYCIN >=8 RESISTANT Resistant     GENTAMICIN <=0.5 SENSITIVE Sensitive     OXACILLIN <=0.25 SENSITIVE Sensitive     TETRACYCLINE >=16 RESISTANT Resistant     VANCOMYCIN 1 SENSITIVE Sensitive     TRIMETH/SULFA <=10 SENSITIVE Sensitive     CLINDAMYCIN <=0.25 SENSITIVE Sensitive     RIFAMPIN <=0.5 SENSITIVE Sensitive     Inducible Clindamycin NEGATIVE Sensitive     * RARE STAPHYLOCOCCUS EPIDERMIDIS  Surgical pcr screen     Status: None   Collection Time: 02/13/21  1:56 PM   Specimen: Nasal Mucosa; Nasal Swab  Result Value Ref Range Status   MRSA, PCR NEGATIVE NEGATIVE Final   Staphylococcus aureus NEGATIVE NEGATIVE Final    Comment: (NOTE) The Xpert SA Assay (FDA approved for NASAL specimens in patients 57 years of age and older), is one component of a comprehensive surveillance program. It is not intended to diagnose infection nor to guide or monitor treatment. Performed at [facility], [address] [place], [place] [address]           Radiology Studies: US EKG SITE RITE  Result  Date: 02/15/2021 If Site Rite image not attached, placement could not be confirmed due to current cardiac rhythm.  US EKG SITE RITE  Result Date: 02/14/2021 If [facility] not attached, placement could  not be confirmed due to current cardiac rhythm.       Scheduled Meds:  acidophilus  1 capsule Oral Daily   vitamin C  500 mg Oral BID   carvedilol  3.125 mg Oral BID WC   Chlorhexidine Gluconate Cloth  6 each Topical Daily   docusate sodium  100 mg Oral BID   enoxaparin (LOVENOX) injection  0.5 mg/kg Subcutaneous Q24H   fenofibrate  54 mg Oral Daily   insulin aspart  0-20 Units Subcutaneous TID WC   insulin aspart  0-5 Units Subcutaneous QHS   insulin aspart  5 Units Subcutaneous TID WC   insulin glargine-yfgn  25 Units Subcutaneous Daily   lisinopril  5 mg Oral BID   montelukast  10 mg Oral QHS   multivitamin with minerals  1 tablet Oral Daily   nicotine  14 mg Transdermal Daily   nutrition supplement (JUVEN)  1 packet Oral BID BM   ondansetron (ZOFRAN) IV  4 mg Intravenous Once   pantoprazole  40 mg Oral Daily   pravastatin  40 mg Oral QHS   Ensure Max Protein  11 oz Oral QHS   saccharomyces boulardii  250 mg Oral BID   sodium chloride flush  10-40 mL Intracatheter Q12H   venlafaxine XR  37.5 mg Oral Q breakfast   zinc sulfate  220 mg Oral Daily   Continuous Infusions:  sodium chloride 10 mL/hr at 02/15/21 1513   ampicillin-sulbactam (UNASYN) IV 3 g (02/16/21 0247)          Glade LloydKshitiz Jun Osment, MD [facility] 02/16/2021, 7:52 AM

## 2021-02-16 NOTE — Treatment Plan (Addendum)
Diagnosis: Diabetes mellitus Left foot infection with osteomyelitis  Baseline Creatinine <1  Culture Result:polymicrobial ( enterococcus fingoldia )  Allergies  Allergen Reactions   Shellfish Allergy Anaphylaxis   Codeine Other (See Comments)    Migraine   Morphine And Related Other (See Comments)    Reaction:  Severe migraines    Percocet [Oxycodone-Acetaminophen] Nausea And Vomiting   Rosuvastatin     Other reaction(s): Muscle Pain   Penicillins Rash and Other (See Comments)    Has patient had a PCN reaction causing immediate rash, facial/tongue/throat swelling, SOB or lightheadedness with hypotension: No Has patient had a PCN reaction causing severe rash involving mucus membranes or skin necrosis: No Has patient had a PCN reaction that required hospitalization No Has patient had a PCN reaction occurring within the last 10 years: No If all of the above answers are "NO", then may proceed with Cephalosporin use.   Sulfa Antibiotics Rash   Theophyllines Itching and Rash    OPAT Orders Discharge antibiotics: Unasyn 3 grams IV every 6 hours for total of 6 weeks Until 03/26/21 Will need augmentin  PO $R'875mg'bI$  BID after she completes IV  [facility] Per Protocol:  Labs weekly while on IV antibiotics: _X_ CBC with differential  _X_ CMP  _X_ ESR   _X_ Please pull PIC at completion of IV antibiotics   Fax weekly labs to [phone]  Clinic Follow Up Appt: with Dr.Ravishankar3/30/23 at 8.30am   Call [phone] with any concerns , critical labs

## 2021-02-16 NOTE — Plan of Care (Signed)

## 2021-02-17 DIAGNOSIS — Z720 Tobacco use: Secondary | ICD-10-CM

## 2021-02-17 LAB — GLUCOSE, CAPILLARY
Glucose-Capillary: 206 mg/dL — ABNORMAL HIGH (ref 70–99)
Glucose-Capillary: 256 mg/dL — ABNORMAL HIGH (ref 70–99)
Glucose-Capillary: 259 mg/dL — ABNORMAL HIGH (ref 70–99)
Glucose-Capillary: 210 mg/dL — ABNORMAL HIGH (ref 70–99)

## 2021-02-17 NOTE — TOC Transition Note (Signed)
Transition of Care [facility]) - CM/SW Discharge Note   Patient Details  Name: Nancy [name] MRN: [id number] Date of Birth: 08-24-1965  Transition of Care [facility]) CM/SW Contact:  Verna [place], [place] Phone Number: 02/17/2021, 2:34 PM   Clinical Narrative:     Patient to discharge home with [facility] through [facility]. Wheelchair ordered today through [employer] and will be delivered before discharge today.  [address], LCSW Transition of Care [phone]   Final next level of care: Home w Home Health Services Barriers to Discharge: Continued Medical Work up   Patient Goals and CMS Choice        Discharge Placement                       Discharge Plan and Services   Discharge Planning Services: CM Consult            DME Arranged: N/A         HH Arranged: RN HH Agency: [facility] Date [facility] Agency Contacted: 02/15/21 Time HH Agency Contacted: [phone] Representative spoke with at [facility] Agency: Meg  Social Determinants of Health (SDOH) Interventions     Readmission Risk Interventions No flowsheet data found.

## 2021-02-17 NOTE — Discharge Summary (Signed)
Physician Discharge Summary  Nancy [name] YQI:[id number] DOB: 1965-09-07 DOA: 02/07/2021  PCP: Mechele Claude, FNP  Admit date: 02/07/2021 Discharge date: 02/17/2021  Admitted From: Home Disposition: Home  Recommendations for Outpatient Follow-up:  Follow up with PCP  Outpatient follow-up with podiatry.  Discharge wound care/pain medication regimen as per podiatry recommendations Outpatient follow-up with ID.  Patient will need weekly labs as per ID Follow up in ED if symptoms worsen or new appear   Home Health: PT/RN Equipment/Devices: PICC line  Discharge Condition: Stable CODE STATUS: Full Diet recommendation: Heart healthy/carb modified  Brief/Interim Summary: 56 year old female with history of group B strep left second toe osteomyelitis status post amputation in 05/2011, prior right TKR in 2013, chronic left heel ulcer requiring recent debridement by podiatry as an outpatient presented with fever and severe left foot pain.  She was started on broad-spectrum antibiotics.  Podiatry was consulted.  She underwent surgical intervention by podiatry on 02/08/2021: Wound cultures grew Enterococcus faecalis; antibiotics changed to IV Unasyn by ID.  She underwent further surgical intervention on 02/14/2021.  Subsequently, she has remained stable.  ID recommended IV Unasyn till 03/26/2021 followed by oral Augmentin and outpatient follow-up with ID.  She currently has PICC line.  Patient is currently hemodynamically stable.  Podiatry and ID have cleared the patient for discharge.  She will be discharged home today with home health PT/RN.  Discharge Diagnoses:  Acute left foot osteomyelitis/diabetic foot ulcer infection -Status post excisional debridement arthrotomy and I&D on 02/08/2021 by podiatry.  Wound cultures grew Enterococcus faecalis and Staph epidermidis: ID subsequently changed ID antibiotics to IV Unasyn considering Staph epidermidis as contaminant.  Will need 6 weeks of IV Unasyn till  03/26/2021 followed by Augmentin as per ID.  Outpatient follow-up with ID.  Patient currently has a PICC line. -Status post repeat I&D with placement of wound VAC on 02/14/2021 by podiatry.  Patient is currently hemodynamically stable.  Podiatry and ID have cleared the patient for discharge.  She will be discharged home today with home health PT/RN.   Sepsis: Present on admission -Resolved   Diabetes mellitus type 2 uncontrolled with hyperglycemia -Continue Home regimen.  [place] for GERD.  Outpatient follow-up with PCP.    Nausea and vomiting: Resolved -Possibly from diabetic gastroparesis.  Currently tolerating diet   Hyponatremia -Resolved  Leukocytosis -Resolved  Anemia of chronic disease -Possibly from chronic illnesses.  Hemoglobin stable  Thrombocytosis -Possibly reactive   Tobacco abuse -Outpatient follow-up.  Essential hypertension -Blood pressure currently stable.  Continue Coreg and lisinopril.  Hyperlipidemia -Continue statin  Obesity  -outpatient follow-up   Discharge Instructions  Discharge Instructions     [facility] pharmacist to adjust dose for Vancomycin, Aminoglycosides and other anti-infective therapies as requested by physician.   Complete by: As directed    [facility] to provide Cath Flo 64m   Complete by: As directed    Administer for PICC line occlusion and as ordered by physician for other access device issues.   Anaphylaxis Kit: Provided to treat any anaphylactic reaction to the medication being provided to the patient if First Dose or when requested by physician   Complete by: As directed    Epinephrine 116mml vial / amp: Administer 0.79m79m0.79ml58mubcutaneously once for moderate to severe anaphylaxis, nurse to call physician and pharmacy when reaction occurs and call 911 if needed for immediate care   Diphenhydramine 50mg57mIV vial: Administer 25-50mg 80mM PRN for first dose reaction, rash, itching, mild reaction,  nurse to call physician and pharmacy when reaction occurs   Sodium Chloride 0.9% NS 537m IV: Administer if needed for hypovolemic blood pressure drop or as ordered by physician after call to physician with anaphylactic reaction   Change dressing on IV access line weekly and PRN   Complete by: As directed    Diet - low sodium heart healthy   Complete by: As directed    Diet Carb Modified   Complete by: As directed    Discharge wound care:   Complete by: As directed    As per podiatry recommendations   Flush IV access with Sodium Chloride 0.9% and Heparin 10 units/ml or 100 units/ml   Complete by: As directed    Home infusion instructions - [facility]   Complete by: As directed    Instructions: Flush IV access with Sodium Chloride 0.9% and Heparin 10units/ml or 100units/ml   Change dressing on IV access line: Weekly and PRN   Instructions Cath Flo 2108m Administer for PICC Line occlusion and as ordered by physician for other access device   [facility] pharmacist to adjust dose for: Vancomycin, Aminoglycosides and other anti-infective therapies as requested by physician   Increase activity slowly   Complete by: As directed    Method of administration may be changed at the discretion of home infusion pharmacist based upon assessment of the patient and/or caregivers ability to self-administer the medication ordered   Complete by: As directed       Allergies as of 02/17/2021       Reactions   Shellfish Allergy Anaphylaxis   Codeine Other (See Comments)   Migraine   Morphine And Related Other (See Comments)   Reaction:  Severe migraines    Percocet [oxycodone-acetaminophen] Nausea And Vomiting   Rosuvastatin    Other reaction(s): Muscle Pain   Penicillins Rash, Other (See Comments)   Has patient had a PCN reaction causing immediate rash, facial/tongue/throat swelling, SOB or lightheadedness with hypotension: No Has patient had a PCN reaction causing severe rash  involving mucus membranes or skin necrosis: No Has patient had a PCN reaction that required hospitalization No Has patient had a PCN reaction occurring within the last 10 years: No If all of the above answers are "NO", then may proceed with Cephalosporin use.   Sulfa Antibiotics Rash   Theophyllines Itching, Rash        Medication List     STOP taking these medications    doxycycline 100 MG tablet Commonly known as: VIBRA-TABS   Santyl ointment Generic drug: collagenase       TAKE these medications    acidophilus Caps capsule Take 1 capsule by mouth daily.   albuterol 108 (90 Base) MCG/ACT inhaler Commonly known as: VENTOLIN HFA Inhale 2 puffs into the lungs every 4 (four) hours as needed for wheezing or shortness of breath. Reported on 03/29/2015   ampicillin-sulbactam  IVPB Commonly known as: UNASYN Inject 3 g into the vein every 6 (six) hours. Indication:  diabetic foot infection with osteomyelitis First Dose: Yes Last Day of Therapy:  03/22/2021 Labs - Once weekly:  CBC/D, CMP, ESR Please pull PIC at completion of IV antibiotics Fax weekly labs to [phone] of administration: Mini-Bag Plus / Gravity Method of administration may be changed at the discretion of home infusion pharmacist based upon assessment of the patient and/or caregiver's ability to self-administer the medication ordered.   ascorbic acid 500 MG tablet Commonly known as: VITAMIN C Take 1  tablet (500 mg total) by mouth 2 (two) times daily.   aspirin EC 81 MG tablet Take 81 mg by mouth daily.   aspirin-acetaminophen-caffeine 250-250-65 MG tablet Commonly known as: EXCEDRIN MIGRAINE Take 2 tablets by mouth every 6 (six) hours as needed for migraine.   carvedilol 3.125 MG tablet Commonly known as: COREG Take 3.125 mg by mouth 2 (two) times daily with a meal.   desvenlafaxine 50 MG 24 hr tablet Commonly known as: PRISTIQ Take 50 mg by mouth daily.   docusate sodium 100 MG  capsule Commonly known as: COLACE Take 1 capsule (100 mg total) by mouth 2 (two) times daily.   fenofibrate 54 MG tablet Take 54 mg by mouth daily.   ibuprofen 800 MG tablet Commonly known as: ADVIL Take 800 mg by mouth every 8 (eight) hours as needed (for migraines).   lisinopril 5 MG tablet Commonly known as: ZESTRIL Take 5 mg by mouth 2 (two) times daily.   metFORMIN 500 MG tablet Commonly known as: GLUCOPHAGE Take 3 tablets (1,500 mg total) by mouth 2 (two) times daily with a meal. Pt takes two tablets with breakfast and three tablets with dinner. What changed:  how much to take when to take this additional instructions   Miconazole Nitrate 2 % Powd 200 mg by Does not apply route 2 (two) times daily as needed.   montelukast 10 MG tablet Commonly known as: SINGULAIR Take 10 mg by mouth at bedtime.   Mounjaro 5 MG/0.5ML Pen Generic drug: tirzepatide Inject 5 mg into the skin every _0 /10/23 1324            Follow-up Information     Samara Deist, DPM Follow up on 02/14/2021.   Specialty: Podiatry Why: @ 10:15 am at [facility] information: [place] [address] [address], [place], [place]. Schedule an appointment as soon as possible for a visit in 1 week(s).   Specialty: Family Medicine Contact information: [place] [address] [phone]         Tsosie Billing, MD. Call in  2 week(s).   Specialty: Infectious Diseases Contact information: [place] [address] [phone]                Allergies  Allergen Reactions   Shellfish Allergy Anaphylaxis   Codeine Other (See Comments)    Migraine   Morphine And Related Other (See Comments)    Reaction:  Severe migraines    Percocet [Oxycodone-Acetaminophen] Nausea And Vomiting   Rosuvastatin     Other reaction(s): Muscle Pain   Penicillins Rash and Other (See Comments)    Has patient had a PCN reaction causing immediate rash, facial/tongue/throat swelling, SOB or lightheadedness with hypotension: No Has patient had a PCN reaction causing severe rash involving mucus membranes or skin necrosis: No Has patient had a PCN reaction that required hospitalization No Has patient had a PCN reaction occurring within the last 10 years: No If all of the above answers are "NO", then may proceed with Cephalosporin use.   Sulfa Antibiotics Rash   Theophyllines Itching and Rash    Consultations: Podiatry/ID   Procedures/Studies: DG Ankle Complete Left  Result Date: 02/07/2021 CLINICAL DATA:  Redness/swelling, wound to left ankle, fever EXAM: LEFT ANKLE COMPLETE - 3+ VIEW COMPARISON:  None. FINDINGS: Degenerative changes of the mid foot, chronic. Soft tissue wound along the  posterior aspect of the calcaneus. Associated posterior calcaneal fracture, presumed to reflect a pathologic fracture related to calcaneal osteomyelitis given the appearance. IMPRESSION: Posterior calcaneal fracture, presumed to reflect a pathologic fracture related to calcaneal osteomyelitis, with overlying soft tissue wound. Electronically Signed   By: Julian Hy M.D.   On: 02/07/2021 22:35   MR ANKLE LEFT WO CONTRAST  Result Date: 02/12/2021 CLINICAL DATA:  Osteomyelitis suspected. EXAM: MRI OF THE LEFT ANKLE WITHOUT CONTRAST TECHNIQUE: Multiplanar, multisequence MR imaging of the ankle was performed. No intravenous contrast was administered. COMPARISON:  Radiographs dated February 07, 2021 FINDINGS: TENDONS Peroneal: Peroneal longus tendon intact. Peroneal brevis intact. Posteromedial: Posterior tibial tendon intact. Flexor hallucis longus tendon intact. Flexor digitorum longus tendon intact. Anterior: Tibialis anterior tendon intact. Extensor hallucis longus tendon intact Extensor digitorum longus tendon intact. Achilles: There is avulsion fracture about the insertion of the Achilles with small avulsed, osseous fragment above the remaining calcaneus. There is large adjacent deep skin wound. Plantar Fascia: Intact. LIGAMENTS Lateral: Anterior talofibular ligament intact. Calcaneofibular ligament intact. Posterior talofibular ligament intact. Anterior and posterior tibiofibular ligaments intact. Medial: Chronic degeneration of the deltoid ligament. Chronic degenerative tear of the spring ligament. CARTILAGE Ankle Joint: No joint effusion. Normal ankle mortise. No chondral defect. Subtalar Joints/Sinus Tarsi: Subtalar joint intact. No joint effusion. Bones: Marrow edema of the posterior aspect of the calcaneus with a pathological avulsion fracture of the Achilles. Partial subluxations of the talus at the talonavicular joint, likely sequela of chronic ligamentous injury/degeneration in the settings of  Charcot arthropathy. There is also subchondral edema at the calcaneocuboid joint. Generalized heterogeneous marrow of the distal tibia and fibula as well as multiple tarsal bones. Soft Tissue: No fluid collection or hematoma. Muscles are normal without edema or atrophy. Tarsal tunnel is normal. IMPRESSION: 1. Osteomyelitis of the posterior aspect of the calcaneus with avulsion fracture about the insertion of the Achilles tendon. Large skin defect about the posterior aspect of the calcaneus. 2. Partial subluxation at the talonavicular joint, likely sequela of chronic ligamentous injury and/or Charcot arthropathy. 3. Generalized heterogeneous marrow of the calcaneus as well as degenerative changes at the talonavicular and calcaneocuboid joints. 4. Generalized subcutaneous  soft tissue edema with myopathy/myositis of the plantar muscles. 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 No drainable fluid collection or abscess. Electronically Signed   By: Keane Police D.O.   On: 02/12/2021 17:29   DG Chest Port 1 View  Result Date: 02/07/2021 CLINICAL DATA:  Fever, left ankle redness/swelling EXAM: PORTABLE CHEST 1 VIEW COMPARISON:  05/07/2011 FINDINGS: Lungs are clear.  No pleural effusion or pneumothorax. The heart is normal in size. IMPRESSION: No evidence of acute cardiopulmonary disease. Electronically Signed   By: Julian Hy M.D.   On: 02/07/2021 22:26   DG MINI C-ARM IMAGE ONLY  Result Date: 02/08/2021 There is no interpretation for this exam.  This order is for images obtained during a surgical procedure.  Please See "Surgeries" Tab for more information regarding the procedure.   VAS [place] ABI WITH/WO TBI  Result Date: 02/05/2021  LOWER EXTREMITY DOPPLER STUDY Patient Name:  NINETTE COTTA  Date of Exam:   01/30/2021 Medical Rec #: [id number]            Accession #:    [id number] Date of Birth: 02/10/1965           Patient Gender: F Patient Age:   76 years Exam Location:  [facility]  Procedure:      VAS [place] ABI WITH/WO TBI Referring Phys: --------------------------------------------------------------------------------  Indications: Left heel ulcer slow healing.  Performing Technologist: Concha Norway RVT  Examination Guidelines: A complete evaluation includes at minimum, Doppler waveform signals and systolic blood pressure reading at the level of bilateral brachial, anterior tibial, and posterior tibial arteries, when vessel segments are accessible. Bilateral testing is considered an integral part of a complete examination. Photoelectric Plethysmograph (PPG) waveforms and toe systolic pressure readings are included as required and additional duplex testing as needed. Limited examinations for reoccurring indications may be performed as noted.  ABI Findings: +---------+------------------+-----+---------+--------+  Right     Rt Pressure (mmHg) Index Waveform  Comment   +---------+------------------+-----+---------+--------+  Brachial  184                                          +---------+------------------+-----+---------+--------+  ATA       204                1.11  triphasic           +---------+------------------+-----+---------+--------+  PTA       179                0.97  biphasic            +---------+------------------+-----+---------+--------+  Great Toe 185                1.01  Normal              +---------+------------------+-----+---------+--------+ +---------+------------------+-----+----------+-------+  Left      Lt Pressure (mmHg) Index Waveform   Comment  +---------+------------------+-----+----------+-------+  Brachial  180                                          +---------+------------------+-----+----------+-------+  ATA       206                1.12  monophasic          +---------+------------------+-----+----------+-------+  PTA       196                1.07  monophasic          +---------+------------------+-----+----------+-------+  Great Toe 123                0.67   Abnormal            +---------+------------------+-----+----------+-------+  Summary: Right: Resting right ankle-brachial index is within normal range. No evidence of significant right lower extremity arterial disease. The right toe-brachial index is normal. Left: The left toe-brachial index is abnormal. Although ankle brachial indices are within normal limits (0.95-1.29), arterial Doppler waveforms at the ankle suggest some component of arterial occlusive disease.  *See table(s) above for measurements and observations.  Electronically signed by Leotis Pain MD on 02/05/2021 at 10:20:11 AM.    Final    [place] EKG SITE RITE  Result Date: 02/15/2021 If Site Rite image not attached, placement could not be confirmed due to current cardiac rhythm.  [place] EKG SITE RITE  Result Date: 02/14/2021 If [facility] image not attached, placement could not be confirmed due to current cardiac rhythm.     Subjective: Patient seen and examined adverse event feels okay to go home today.  Denies worsening fever, nausea, vomiting or lower extremity pain.  Discharge Exam: Vitals:   02/17/21 0407 02/17/21 0747  BP: (!) 138/54 139/69  Pulse: 64 69  Resp: 14 15  Temp: 97.8 F (36.6 C) 98 F (36.7 C)  SpO2: 96% 97%    General: Pt is alert, awake, not in acute distress.  Currently on room air. Cardiovascular: rate controlled, S1/S2 + Respiratory: bilateral decreased breath sounds at bases Abdominal: Soft, obese, NT, ND, bowel sounds + Extremities: Left foot dressing present along with wound VAC.   The results of significant diagnostics from this hospitalization (including imaging, microbiology, ancillary and laboratory) are listed below for reference.     Microbiology: Recent Results (from the past 240 hour(s))  Resp Panel by RT-PCR (Flu A&B, Covid) Nasopharyngeal Swab     Status: None   Collection Time: 02/07/21 11:10 PM   Specimen: Nasopharyngeal Swab; Nasopharyngeal(NP) swabs in vial transport medium  Result  Value Ref Range Status   SARS Coronavirus 2 by RT PCR NEGATIVE NEGATIVE Final    Comment: (NOTE) SARS-CoV-2 target nucleic acids are NOT DETECTED.  The SARS-CoV-2 RNA is generally detectable in upper respiratory specimens during the acute phase of infection. The lowest concentration of SARS-CoV-2 viral copies this assay can detect is 138 copies/mL. A negative result does not preclude SARS-Cov-2 infection and should not be used as the sole basis for treatment or other patient management decisions. A negative result may occur with  improper specimen collection/handling, submission of specimen other than nasopharyngeal swab, presence of viral mutation(s) within the areas targeted by this assay, and inadequate number of viral copies(<138 copies/mL). A negative result must be combined with clinical observations, patient history, and epidemiological information. The expected result is Negative.  Fact Sheet for Patients:  EntrepreneurPulse.com.au  Fact Sheet for Healthcare Providers:  IncredibleEmployment.be  This test is no t yet approved or cleared by the [place] FDA and  has been authorized for detection and/or diagnosis of SARS-CoV-2 by FDA under an Emergency Use Authorization (EUA). This EUA will remain  in effect (meaning this test can be used) for the duration of the COVID-19 declaration under Section 564(b)(1) of the Act, 21 U.S.C.section 360bbb-3(b)(1), unless the authorization is  terminated  or revoked sooner.       Influenza A by PCR NEGATIVE NEGATIVE Final   Influenza B by PCR NEGATIVE NEGATIVE Final    Comment: (NOTE) The Xpert Xpress SARS-CoV-2/FLU/RSV plus assay is intended as an aid in the diagnosis of influenza from Nasopharyngeal swab specimens and should not be used as a sole basis for treatment. Nasal washings and aspirates are unacceptable for Xpert Xpress SARS-CoV-2/FLU/RSV testing.  Fact Sheet for  Patients: EntrepreneurPulse.com.au  Fact Sheet for Healthcare Providers: IncredibleEmployment.be  This test is not yet approved or cleared by the [place] FDA and has been authorized for detection and/or diagnosis of SARS-CoV-2 by FDA under an Emergency Use Authorization (EUA). This EUA will remain in effect (meaning this test can be used) for the duration of the COVID-19 declaration under Section 564(b)(1) of the Act, 21 U.S.C. section 360bbb-3(b)(1), unless the authorization is terminated or revoked.  Performed at [facility], [address] [place], [place] [address]   Blood Culture (routine x 2)     Status: None   Collection Time: 02/07/21 11:10 PM   Specimen: BLOOD  Result Value Ref Range Status   Specimen Description BLOOD LEFT FOREARM  Final   Special Requests   Final    BOTTLES DRAWN AEROBIC AND ANAEROBIC Blood Culture results may not be optimal due to an excessive volume of blood received in culture bottles   Culture   Final    NO GROWTH 5 DAYS Performed at [facility], [address], [place], [place] [address]    Report Status 02/12/2021 FINAL  Final  Blood Culture (routine x 2)     Status: None   Collection Time: 02/07/21 11:10 PM   Specimen: BLOOD  Result Value Ref Range Status   Specimen Description BLOOD LEFT ASSIST CONTROL  Final   Special Requests   Final    BOTTLES DRAWN AEROBIC AND ANAEROBIC Blood Culture adequate volume   Culture   Final    NO GROWTH 5 DAYS Performed at [facility], [address] [place], [place] [address]    Report Status 02/12/2021 FINAL  Final  Urine Culture     Status: None   Collection Time: 02/07/21 11:10 PM   Specimen: In/Out Cath Urine  Result Value Ref Range Status   Specimen Description   Final    IN/OUT CATH URINE Performed at [facility], [address] [place], [place] [address]    Special Requests   Final    NONE Performed at  [facility], [address] [place], [place] [address]    Culture   Final    NO GROWTH Performed at [facility], [place] [address], [place], [place] [address]    Report Status 02/09/2021 FINAL  Final  Aerobic/Anaerobic Culture w Gram Stain (surgical/deep wound)     Status: None   Collection Time: 02/08/21  7:55 AM   Specimen: PATH Bone resection; Tissue  Result Value Ref Range Status   Specimen Description   Final    BONE LEFT CALCANEUS Performed at [facility], [address] [place], [place] [address]    Special Requests   Final    NONE Performed at [facility], [place], [place] [address]    Gram Stain RARE WBC SEEN FEW GRAM POSITIVE COCCI   Final   Culture   Final    MODERATE ENTEROCOCCUS FAECALIS RARE STAPHYLOCOCCUS EPIDERMIDIS MODERATE FINEGOLDIA MAGNA Standardized susceptibility testing for  this organism is not available. Performed at [facility], [place] [address] [place], [place] [address]    Report Status 02/12/2021 FINAL  Final   Organism ID, Bacteria ENTEROCOCCUS FAECALIS  Final   Organism ID, Bacteria STAPHYLOCOCCUS EPIDERMIDIS  Final      Susceptibility   Enterococcus faecalis - MIC*    AMPICILLIN <=2 SENSITIVE Sensitive     VANCOMYCIN 1 SENSITIVE Sensitive     GENTAMICIN SYNERGY RESISTANT Resistant     * MODERATE ENTEROCOCCUS FAECALIS   Staphylococcus epidermidis - MIC*    CIPROFLOXACIN 4 RESISTANT Resistant     ERYTHROMYCIN >=8 RESISTANT Resistant     GENTAMICIN <=0.5 SENSITIVE Sensitive     OXACILLIN <=0.25 SENSITIVE Sensitive     TETRACYCLINE >=16 RESISTANT Resistant     VANCOMYCIN 1 SENSITIVE Sensitive     TRIMETH/SULFA <=10 SENSITIVE Sensitive     CLINDAMYCIN <=0.25 SENSITIVE Sensitive     RIFAMPIN <=0.5 SENSITIVE Sensitive     Inducible Clindamycin NEGATIVE Sensitive     * RARE STAPHYLOCOCCUS EPIDERMIDIS  Surgical pcr screen     Status: None   Collection Time: 02/13/21  1:56 PM    Specimen: Nasal Mucosa; Nasal Swab  Result Value Ref Range Status   MRSA, PCR NEGATIVE NEGATIVE Final   Staphylococcus aureus NEGATIVE NEGATIVE Final    Comment: (NOTE) The Xpert SA Assay (FDA approved for NASAL specimens in patients 50 years of age and older), is one component of a comprehensive surveillance program. It is not intended to diagnose infection nor to guide or monitor treatment. Performed at [facility], [address] [place], [place] [address]      Labs: BNP (last 3 results) No results for input(s): BNP in the last 8760 hours. Basic Metabolic Panel: Recent Labs  Lab 02/11/21 0414 02/13/21 0323  NA 135 135  K 3.6 3.8  CL 99 98  CO2 28 29  GLUCOSE 255* 246*  BUN 11 12  CREATININE 0.62 0.65  CALCIUM 8.5* 8.5*   Liver Function Tests: Recent Labs  Lab 02/11/21 0414  AST 10*  ALT 11  ALKPHOS 57  BILITOT 0.4  PROT 5.7*  ALBUMIN 1.8*   No results for input(s): LIPASE, AMYLASE in the last 168 hours. No results for input(s): AMMONIA in the last 168 hours. CBC: Recent Labs  Lab 02/11/21 0414 02/13/21 0323  WBC 8.8 9.9  NEUTROABS 5.2 6.3  HGB 10.1* 10.0*  HCT 31.6* 31.4*  MCV 92.9 91.5  PLT 493* 572*   Cardiac Enzymes: No results for input(s): CKTOTAL, CKMB, CKMBINDEX, TROPONINI in the last 168 hours. BNP: Invalid input(s): POCBNP CBG: Recent Labs  Lab 02/16/21 0743 02/16/21 1159 02/16/21 1659 02/16/21 2051 02/17/21 0751  GLUCAP 173* 203* 288* 146* 206*   D-Dimer No results for input(s): DDIMER in the last 72 hours. Hgb A1c No results for input(s): HGBA1C in the last 72 hours. Lipid Profile No results for input(s): CHOL, HDL, LDLCALC, TRIG, CHOLHDL, LDLDIRECT in the last 72 hours. Thyroid function studies No results for input(s): TSH, T4TOTAL, T3FREE, THYROIDAB in the last 72 hours.  Invalid input(s): FREET3 Anemia work up No results for input(s): VITAMINB12, FOLATE, FERRITIN, TIBC, IRON, RETICCTPCT in the last 72  hours. Urinalysis    Component Value Date/Time   COLORURINE YELLOW 02/07/2021 2310   APPEARANCEUR CLEAR 02/07/2021 2310   LABSPEC 1.015 02/07/2021 2310   PHURINE 5.0 02/07/2021 2310   GLUCOSEU >1,000 (A) 02/07/2021 2310   HGBUR MODERATE (A) 02/07/2021 2310  BILIRUBINUR SMALL (A) 02/07/2021 2310   KETONESUR 80 (A) 02/07/2021 2310   PROTEINUR NEGATIVE 02/07/2021 2310   UROBILINOGEN 0.2 01/03/2011 1138   NITRITE NEGATIVE 02/07/2021 2310   LEUKOCYTESUR NEGATIVE 02/07/2021 2310   Sepsis Labs Invalid input(s): PROCALCITONIN,  WBC,  LACTICIDVEN Microbiology Recent Results (from the past 240 hour(s))  Resp Panel by RT-PCR (Flu A&B, Covid) Nasopharyngeal Swab     Status: None   Collection Time: 02/07/21 11:10 PM   Specimen: Nasopharyngeal Swab; Nasopharyngeal(NP) swabs in vial transport medium  Result Value Ref Range Status   SARS Coronavirus 2 by RT PCR NEGATIVE NEGATIVE Final    Comment: (NOTE) SARS-CoV-2 target nucleic acids are NOT DETECTED.  The SARS-CoV-2 RNA is generally detectable in upper respiratory specimens during the acute phase of infection. The lowest concentration of SARS-CoV-2 viral copies this assay can detect is 138 copies/mL. A negative result does not preclude SARS-Cov-2 infection and should not be used as the sole basis for treatment or other patient management decisions. A negative result may occur with  improper specimen collection/handling, submission of specimen other than nasopharyngeal swab, presence of viral mutation(s) within the areas targeted by this assay, and inadequate number of viral copies(<138 copies/mL). A negative result must be combined with clinical observations, patient history, and epidemiological information. The expected result is Negative.  Fact Sheet for Patients:  EntrepreneurPulse.com.au  Fact Sheet for Healthcare Providers:  IncredibleEmployment.be  This test is no t yet approved or cleared  by the [place] FDA and  has been authorized for detection and/or diagnosis of SARS-CoV-2 by FDA under an Emergency Use Authorization (EUA). This EUA will remain  in effect (meaning this test can be used) for the duration of the COVID-19 declaration under Section 564(b)(1) of the Act, 21 U.S.C.section 360bbb-3(b)(1), unless the authorization is terminated  or revoked sooner.       Influenza A by PCR NEGATIVE NEGATIVE Final   Influenza B by PCR NEGATIVE NEGATIVE Final    Comment: (NOTE) The Xpert Xpress SARS-CoV-2/FLU/RSV plus assay is intended as an aid in the diagnosis of influenza from Nasopharyngeal swab specimens and should not be used as a sole basis for treatment. Nasal washings and aspirates are unacceptable for Xpert Xpress SARS-CoV-2/FLU/RSV testing.  Fact Sheet for Patients: EntrepreneurPulse.com.au  Fact Sheet for Healthcare Providers: IncredibleEmployment.be  This test is not yet approved or cleared by the [place] FDA and has been authorized for detection and/or diagnosis of SARS-CoV-2 by FDA under an Emergency Use Authorization (EUA). This EUA will remain in effect (meaning this test can be used) for the duration of the COVID-19 declaration under Section 564(b)(1) of the Act, 21 U.S.C. section 360bbb-3(b)(1), unless the authorization is terminated or revoked.  Performed at [facility], [address] [place], [place] [address]   Blood Culture (routine x 2)     Status: None   Collection Time: 02/07/21 11:10 PM   Specimen: BLOOD  Result Value Ref Range Status   Specimen Description BLOOD LEFT FOREARM  Final   Special Requests   Final    BOTTLES DRAWN AEROBIC AND ANAEROBIC Blood Culture results may not be optimal due to an excessive volume of blood received in culture bottles   Culture   Final    NO GROWTH 5 DAYS Performed at [facility], [address], [place], [place] [address]    Report  Status 02/12/2021 FINAL  Final  Blood Culture (routine x 2)     Status: None   Collection  Time: 02/07/21 11:10 PM   Specimen: BLOOD  Result Value Ref Range Status   Specimen Description BLOOD LEFT ASSIST CONTROL  Final   Special Requests   Final    BOTTLES DRAWN AEROBIC AND ANAEROBIC Blood Culture adequate volume   Culture   Final    NO GROWTH 5 DAYS Performed at [facility], [address], [place], [place] [address]    Report Status 02/12/2021 FINAL  Final  Urine Culture     Status: None   Collection Time: 02/07/21 11:10 PM   Specimen: In/Out Cath Urine  Result Value Ref Range Status   Specimen Description   Final    IN/OUT CATH URINE Performed at [facility], [address] [place], [place] [address]    Special Requests   Final    NONE Performed at [facility], [address], [place], [place] [address]    Culture   Final    NO GROWTH Performed at [facility], [place] [address] [place], [place] [address]    Report Status 02/09/2021 FINAL  Final  Aerobic/Anaerobic Culture w Gram Stain (surgical/deep wound)     Status: None   Collection Time: 02/08/21  7:55 AM   Specimen: PATH Bone resection; Tissue  Result Value Ref Range Status   Specimen Description   Final    BONE LEFT CALCANEUS Performed at [facility], [address], [place], [place] [address]    Special Requests   Final    NONE Performed at [facility], [place], [place] [address]    Gram Stain RARE WBC SEEN FEW GRAM POSITIVE COCCI   Final   Culture   Final    MODERATE ENTEROCOCCUS FAECALIS RARE STAPHYLOCOCCUS EPIDERMIDIS MODERATE FINEGOLDIA MAGNA Standardized susceptibility testing for this organism is not available. Performed at [facility], [place] [address], [place], [place] [address]    Report Status 02/12/2021 FINAL  Final   Organism ID, Bacteria ENTEROCOCCUS FAECALIS  Final   Organism ID, Bacteria STAPHYLOCOCCUS  EPIDERMIDIS  Final      Susceptibility   Enterococcus faecalis - MIC*    AMPICILLIN <=2 SENSITIVE Sensitive     VANCOMYCIN 1 SENSITIVE Sensitive     GENTAMICIN SYNERGY RESISTANT Resistant     * MODERATE ENTEROCOCCUS FAECALIS   Staphylococcus epidermidis - MIC*    CIPROFLOXACIN 4 RESISTANT Resistant     ERYTHROMYCIN >=8 RESISTANT Resistant     GENTAMICIN <=0.5 SENSITIVE Sensitive     OXACILLIN <=0.25 SENSITIVE Sensitive     TETRACYCLINE >=16 RESISTANT Resistant     VANCOMYCIN 1 SENSITIVE Sensitive     TRIMETH/SULFA <=10 SENSITIVE Sensitive     CLINDAMYCIN <=0.25 SENSITIVE Sensitive     RIFAMPIN <=0.5 SENSITIVE Sensitive     Inducible Clindamycin NEGATIVE Sensitive     * RARE STAPHYLOCOCCUS EPIDERMIDIS  Surgical pcr screen     Status: None   Collection Time: 02/13/21  1:56 PM   Specimen: Nasal Mucosa; Nasal Swab  Result Value Ref Range Status   MRSA, PCR NEGATIVE NEGATIVE Final   Staphylococcus aureus NEGATIVE NEGATIVE Final    Comment: (NOTE) The Xpert SA Assay (FDA approved for NASAL specimens in patients 75 years of age and older), is one component of a comprehensive surveillance program. It is not intended to diagnose infection nor to guide or monitor treatment. Performed at [facility], [address] [place],  [address]      Time coordinating discharge: 58  minutes  SIGNED:   Aline August, MD  [facility] 02/17/2021, 10:38 AM

## 2021-02-18 LAB — GLUCOSE, CAPILLARY: Glucose-Capillary: 222 mg/dL — ABNORMAL HIGH (ref 70–99)

## 2021-02-18 NOTE — Progress Notes (Signed)
Patient still waiting on wheelchair to discharge home.  Patients pharmacy closed at 8pm and she would be unable to fill her pain medication.  On call MD Jon Billings notified that patient states she does not want to be discharged.  AC was notified by Consulting civil engineer.  Discharge was cancelled.

## 2021-02-18 NOTE — Progress Notes (Signed)
Patient was supposed to be discharged on 02/17/2021 but discharge was held because wheelchair could not be delivered.  She is currently medically stable for discharge.  She will be discharged home today.  Please review the full discharge summary done by me on 02/17/2021 for full details.

## 2021-02-18 NOTE — Progress Notes (Signed)
Discharge Note: Reviewed discharge instructions with pt. Pt verbalized understanding. Pt discharged with WC and all personal belongings. PICC in place, blood return noted. Staff Wheeled pt out. Pt transported to home via family vehicle.

## 2021-02-19 ENCOUNTER — Emergency Department
Admission: EM | Admit: 2021-02-19 | Discharge: 2021-02-19 | Disposition: A | Discharge status: Home / Self Care | Payer: 59 | Attending: Emergency Medicine | Admitting: Emergency Medicine

## 2021-02-19 ENCOUNTER — Other Ambulatory Visit: Payer: Self-pay

## 2021-02-19 DIAGNOSIS — N1832 Chronic kidney disease, stage 3b: Secondary | ICD-10-CM | POA: Diagnosis not present

## 2021-02-19 DIAGNOSIS — E1165 Type 2 diabetes mellitus with hyperglycemia: Secondary | ICD-10-CM | POA: Diagnosis not present

## 2021-02-19 DIAGNOSIS — Y828 Other medical devices associated with adverse incidents: Secondary | ICD-10-CM | POA: Diagnosis not present

## 2021-02-19 DIAGNOSIS — Z794 Long term (current) use of insulin: Secondary | ICD-10-CM | POA: Diagnosis not present

## 2021-02-19 DIAGNOSIS — T82594A Other mechanical complication of infusion catheter, initial encounter: Secondary | ICD-10-CM | POA: Diagnosis present

## 2021-02-19 DIAGNOSIS — D638 Anemia in other chronic diseases classified elsewhere: Secondary | ICD-10-CM | POA: Diagnosis not present

## 2021-02-19 DIAGNOSIS — L97421 Non-pressure chronic ulcer of left heel and midfoot limited to breakdown of skin: Secondary | ICD-10-CM | POA: Diagnosis not present

## 2021-02-19 DIAGNOSIS — Z5321 Procedure and treatment not carried out due to patient leaving prior to being seen by health care provider: Secondary | ICD-10-CM | POA: Insufficient documentation

## 2021-02-19 DIAGNOSIS — I129 Hypertensive chronic kidney disease with stage 1 through stage 4 chronic kidney disease, or unspecified chronic kidney disease: Secondary | ICD-10-CM | POA: Diagnosis not present

## 2021-02-19 DIAGNOSIS — Z7984 Long term (current) use of oral hypoglycemic drugs: Secondary | ICD-10-CM | POA: Diagnosis not present

## 2021-02-19 DIAGNOSIS — E1122 Type 2 diabetes mellitus with diabetic chronic kidney disease: Secondary | ICD-10-CM | POA: Diagnosis not present

## 2021-02-19 DIAGNOSIS — E669 Obesity, unspecified: Secondary | ICD-10-CM | POA: Diagnosis not present

## 2021-02-19 DIAGNOSIS — E11621 Type 2 diabetes mellitus with foot ulcer: Secondary | ICD-10-CM | POA: Diagnosis not present

## 2021-02-19 NOTE — TOC CM/SW Note (Signed)
Received call from [facility] requesting update on when [facility] would be starting.   TOC RN CM outreached to [facility] @ [phone] and confirmed patient is being seen today and they have also already made contact with Pam @ [facility]fusion [facility] to confirm everything is set for infusion as well. Meg reports they have been in contact with family/patient. No other needs.

## 2021-02-19 NOTE — ED Provider Triage Note (Signed)
Emergency Medicine Provider Triage Evaluation Note  Nancy [name], a 56 y.o. female  was evaluated in triage.  Pt complains of PICC line blockage.  The PICC line was initially placed on Friday without difficulty.  Patient has been tolerating daily infusions without problems.  She is being sent at the advice of her home health nurse, after her daily infusion took longer than expected.  The nurse attempted to flush the line with heparin, but denies any significant change.  Patient presents to the ED for further evaluation patient denies any fevers, chills, sweats, chest pain, shortness of breath.  Review of Systems   Positive: PICC line dysfunction  Negative: FCS  Physical Exam  BP (!) 176/90    Pulse 87    Temp 98.7 F (37.1 C) (Oral)    Resp 18    Ht 5\' 9"  (1.753 m)    Wt 109.3 kg    SpO2 97%    BMI 35.58 kg/m  Gen:   Awake, no distress   Resp:  Normal effort  MSK:   Moves extremities without difficulty Left heel wound/vacuum in place Other:  CVS: RRR  Medical Decision Making  Medically screening exam initiated at 5:27 PM.  Appropriate orders placed.  Nancy [name] was informed that the remainder of the evaluation will be completed by another provider, this initial triage assessment does not replace that evaluation, and the importance of remaining in the ED until their evaluation is complete.  Patient to the ED from home, for evaluation of dysfunction of her PICC line.   Nancy Panning, PA-C 02/19/21 1755

## 2021-02-19 NOTE — ED Triage Notes (Signed)
Patient to ER via POV with picc line blockage. States she was discharged from inpatient yesterday and line was working appropriately. Reports her home health nurse came by today to administer IV abx but was unable to get the line to flush, states she was finally able to flush some heparin through it with her arm reached over her shoulder. Was told to come to the ER for further evaluation of picc line.   Picc line was placed on Friday with no complication.

## 2021-02-19 NOTE — ED Notes (Signed)
Pt not answering at this time and may have left.

## 2021-03-06 ENCOUNTER — Encounter (INDEPENDENT_AMBULATORY_CARE_PROVIDER_SITE_OTHER): Payer: Self-pay

## 2021-03-06 ENCOUNTER — Encounter (INDEPENDENT_AMBULATORY_CARE_PROVIDER_SITE_OTHER): Payer: Self-pay | Admitting: Vascular Surgery

## 2021-03-12 ENCOUNTER — Telehealth: Payer: Self-pay

## 2021-03-12 NOTE — Telephone Encounter (Signed)
Patient called to clarify IV antibiotic end date, she says her home health nurse told her it wound end on 03/22/21. Per Dr. Lynne Logan note, end date is 03/26/21 - confirmed with Larita Fife at [facility] that they have 3/20 as end date.  ? ?Reminded patient of hr follow up appointment on 03/29/21. ? ?Sandie Ano, RN ? ?

## 2021-03-20 ENCOUNTER — Telehealth: Payer: Self-pay

## 2021-03-20 NOTE — Telephone Encounter (Signed)
Received call today from Daryll Brod, Rn regarding picc line concerns. States that she is has administered cath flow today, but is still not able to get any blood drawn from picc. States that patient has one week left of antibiotics. Would like to know if she should administer cath flow again today or send patient to ED for picc evaluation.  ?Spoke with pharmacy team who recommended patient see ED today. Patient was made aware. ? ?Juanita Laster, RMA ? ?

## 2021-03-29 ENCOUNTER — Other Ambulatory Visit: Payer: Self-pay

## 2021-03-29 ENCOUNTER — Ambulatory Visit: Payer: 59 | Attending: Infectious Diseases | Admitting: Infectious Diseases

## 2021-03-29 VITALS — BP 179/85 | HR 69 | Temp 98.8°F

## 2021-03-29 DIAGNOSIS — E785 Hyperlipidemia, unspecified: Secondary | ICD-10-CM | POA: Diagnosis not present

## 2021-03-29 DIAGNOSIS — Z7984 Long term (current) use of oral hypoglycemic drugs: Secondary | ICD-10-CM | POA: Insufficient documentation

## 2021-03-29 DIAGNOSIS — Z794 Long term (current) use of insulin: Secondary | ICD-10-CM | POA: Diagnosis not present

## 2021-03-29 DIAGNOSIS — I10 Essential (primary) hypertension: Secondary | ICD-10-CM | POA: Diagnosis not present

## 2021-03-29 DIAGNOSIS — E11621 Type 2 diabetes mellitus with foot ulcer: Secondary | ICD-10-CM | POA: Insufficient documentation

## 2021-03-29 DIAGNOSIS — L089 Local infection of the skin and subcutaneous tissue, unspecified: Secondary | ICD-10-CM

## 2021-03-29 DIAGNOSIS — E11628 Type 2 diabetes mellitus with other skin complications: Secondary | ICD-10-CM | POA: Diagnosis not present

## 2021-03-29 DIAGNOSIS — Z7985 Long-term (current) use of injectable non-insulin antidiabetic drugs: Secondary | ICD-10-CM | POA: Diagnosis not present

## 2021-03-29 DIAGNOSIS — Z79899 Other long term (current) drug therapy: Secondary | ICD-10-CM | POA: Insufficient documentation

## 2021-03-29 MED ORDER — AMOXICILLIN-POT CLAVULANATE 875-125 MG PO TABS: 1.0000 | ORAL_TABLET | Freq: Two times a day (BID) | ORAL | 1 refills | Status: DC

## 2021-03-29 NOTE — Progress Notes (Signed)
NAME: Nancy [name]  ?DOB: [birth year]  ?MRN: [id number]  ?Date/Time: 03/29/2021 10:27 AM ? ? ?Subjective:  ? ??follow up after recent hospitalization ?02/07/21-02/17/21 ?Nancy [name] is a 56 y.o. with a history of ?DM, DFI with osteo/necrosis was recently in [facility] and underwent debridement and partial excision of calcaneum by Dr.Fowler- culture positive for enterococcus and finegoldia- she was sent to rehab on Iv unasyn to complete 6 weeks on 3/20./23 ?Saw Dr.Fowler on 03/15/21 10X 5.5 cm wound with no infection at that time. Was asked to see plastic surgeon ?She is here for follow up ?She is here with her wife. ?She is doing very well ?No fever or chills ?The wound is healing very well ?She has a wound VAC ?She uses a knee scooter.  She has not put any weight on that leg ? ?Past Medical History:  ?Diagnosis Date  ? Anxiety   ? Arthritis   ? r knee  ? Asthma   ? CAD (coronary artery disease)   ? a.) LHC 06/04/2009 at [facility]; non-obstructive CAD. b.) CTA with FFR 10/08/2018: extensive mixed plaque proximal to mid LAD (51-69%); Coronary Ca score 217; FFR 0.71 dPDA, 0.86 mLAD, 0.87 dLCx.  ? CCF (congestive cardiac failure) ([place]) 06/06/2008  ? a.) 30% EF. b.) TTE 06/03/2011: EF >55%; triv MR. c.) TTE 04/27/2018: EF 55%; mild LVH; triv PR, mild MR/TR; G1DD.  ? Complication of anesthesia   ? Eczema   ? Family history of adverse reaction to anesthesia   ? a.) PONV in mother and grandmother  ? GERD (gastroesophageal reflux disease) 05/10/2014  ? History of kidney stones   ? HLD (hyperlipidemia)   ? Migraines   ? Motion sickness   ? Panic attacks   ? Pneumonia   ? PONV (postoperative nausea and vomiting)   ? T2DM (type 2 diabetes mellitus) ([place])   ? 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 Unspecified essential hypertension   ?  ?Past Surgical History:  ?Procedure Laterality Date  ? AMPUTATION TOE Left 03/16/2015  ? Procedure: left fifth toe amputation  with incision and drainage;  Surgeon: Samara Deist, DPM;  Location: [facility] ORS;  Service: Podiatry;  Laterality: Left;  ? APPLICATION OF WOUND VAC Left 02/14/2021  ? Procedure: APPLICATION OF WOUND VAC;  Surgeon: Samara Deist, DPM;  Location: [facility] OR[facility];  Service: Podiatry;  Laterality: Left;  ? CARDIAC CATHETERIZATION Left 06/04/2009  ? Procedure: CARDIAC CATHETERIZATION; Location: [facility]  ? CHOLECYSTECTOMY    ? IRRIGATION AND DEBRIDEMENT FOOT Left 02/08/2021  ? Procedure: IRRIGATION AND DEBRIDEMENT FOOT - LFT HEEL ULCER;  Surgeon: Samara Deist, DPM;  Location: [facility] OR[facility];  Service: Podiatry;  Laterality: Left;  ? IRRIGATION AND DEBRIDEMENT FOOT Left 02/14/2021  ? Procedure: IRRIGATION AND DEBRIDEMENT LEFT HEEL;  Surgeon: Samara Deist, DPM;  Location: [facility];  Service: Podiatry;  Laterality: Left;  ? kidney stone removal    ? KNEE ARTHROSCOPY W/ MENISCAL REPAIR    ? NOSE SURGERY  01/07/1989  ? due to fracture  ? TOE AMPUTATION    ? second toe  ? TONSILLECTOMY    ? TOTAL KNEE ARTHROPLASTY  01/11/2011  ? Procedure: TOTAL KNEE ARTHROPLASTY;  Surgeon: Alta Corning;  Location: [facility];  Service: Orthopedics;  Laterality: Right;  COMPUTER ASSISTED TOTAL KNEE REPLACEMENT  ?  ?Social History  ? ?Socioeconomic History  ? Marital status: Married  ?  Spouse name: Not on file  ? Number of children: Not  on file  ? Years of education: Not on file  ? Highest education level: Not on file  ?Occupational History  ? Not on file  ?Tobacco Use  ? Smoking status: Former  ?  Packs/day: 0.00  ?  Years: 30.00  ?  Pack years: 0.00  ?  Types: Cigarettes  ?  Quit date: 03/13/2015  ?  Years since quitting: 6.0  ? Smokeless tobacco: Never  ?Vaping Use  ? Vaping Use: Never used  ?Substance and Sexual Activity  ? Alcohol use: No  ? Drug use: No  ? Sexual activity: Yes  ?  Birth control/protection: Post-menopausal  ?Other Topics Concern  ? Not on file  ?Social History Narrative  ? Full time. Does not regularly exercise. 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   ? ?Social Determinants of Health  ? ?Financial Resource Strain: Not on file  ?Food Insecurity: Not on file  ?Transportation Needs: Not on file  ?Physical Activity: Not on file  ?Stress: Not on file  ?Social Connections: Not on file  ?Intimate Partner Violence: Not on file  ?  ?Family History  ?Problem Relation Age of Onset  ? Transient ischemic attack Mother   ? Asthma Mother   ? Hypertension Mother   ? Lung cancer Father   ? Hypertension Father   ? Asthma Sister   ? Anxiety disorder Sister   ? Stomach cancer Maternal Grandmother   ? Colon cancer Maternal Grandmother   ? Leukemia Maternal Grandmother   ? Melanoma Maternal Grandfather   ? Asthma Sister   ? Hypertension Brother   ? Cancer Other   ? Crohn's disease Other   ? GER disease Other   ? Breast cancer Neg Hx   ? ?Allergies  ?Allergen Reactions  ? Shellfish Allergy Anaphylaxis  ? Codeine Other (See Comments)  ?  Migraine  ? Morphine And Related Other (See Comments)  ?  Reaction:  Severe migraines   ? Percocet [Oxycodone-Acetaminophen] Nausea And Vomiting  ? Rosuvastatin   ?  Other reaction(s): Muscle Pain  ? Penicillins Rash and Other (See Comments)  ?  Has patient had a PCN reaction causing immediate rash, facial/tongue/throat swelling, SOB or lightheadedness with hypotension: No ?Has patient had a PCN reaction causing severe rash involving mucus membranes or skin necrosis: No ?Has patient had a PCN reaction that required hospitalization No ?Has patient had a PCN reaction occurring within the last 10 years: No ?If all of the above answers are "NO", then may proceed with Cephalosporin use.  ? Sulfa Antibiotics Rash  ? Theophyllines Itching and Rash  ? ?I? ?Current Outpatient Medications  ?Medication Sig Dispense Refill  ? acidophilus (RISAQUAD) CAPS capsule Take 1 capsule by mouth daily.    ? albuterol (PROVENTIL HFA;VENTOLIN HFA) 108 (90 Base) MCG/ACT inhaler Inhale 2 puffs into the lungs every 4 (four) hours as needed for wheezing or shortness of  breath. Reported on 03/29/2015 1 Inhaler 3  ? ascorbic acid (VITAMIN C) 500 MG tablet Take 1 tablet (500 mg total) by mouth 2 (two) times daily. 60 tablet 0  ? aspirin EC 81 MG tablet Take 81 mg by mouth daily.    ? aspirin-acetaminophen-caffeine (EXCEDRIN MIGRAINE) 250-250-65 MG tablet Take 2 tablets by mouth every 6 (six) hours as needed for migraine.    ? carvedilol (COREG) 3.125 MG tablet Take 3.125 mg by mouth 2 (two) times daily with a meal.      ? desvenlafaxine (PRISTIQ) 50 MG 24 hr tablet Take 50 mg by mouth  daily.    ? docusate sodium (COLACE) 100 MG capsule Take 1 capsule (100 mg total) by mouth 2 (two) times daily. 30 capsule 0  ? fenofibrate 54 MG tablet Take 54 mg by mouth daily.    ? ibuprofen (ADVIL,MOTRIN) 800 MG tablet Take 800 mg by mouth every 8 (eight) hours as needed (for migraines).     ? lisinopril (ZESTRIL) 5 MG tablet Take 5 mg by mouth 2 (two) times daily.    ? metFORMIN (GLUCOPHAGE) 500 MG tablet Take 3 tablets (1,500 mg total) by mouth 2 (two) times daily with a meal. Pt takes two tablets with breakfast and three tablets with dinner. (Patient taking differently: Take 1,000-1,500 mg by mouth See admin instructions. Take 1500 mg by mouth in the morning and 1000 mg in the evening) 150 tablet 1  ? Miconazole Nitrate 2 % POWD 200 mg by Does not apply route 2 (two) times daily as needed. (Patient not taking: Reported on 02/02/2021) 100 g 1  ? montelukast (SINGULAIR) 10 MG tablet Take 10 mg by mouth at bedtime.    ? MOUNJARO 5 MG/0.5ML Pen Inject 5 mg into the skin every Sunday.    ? nystatin (MYCOSTATIN/NYSTOP) powder Apply 1 application topically daily as needed (irritation).    ? ondansetron (ZOFRAN) 4 MG tablet Take 4 mg by mouth every 8 (eight) hours as needed for vomiting or nausea.    ? pantoprazole (PROTONIX) 40 MG tablet Take 40 mg by mouth daily.    ? polyethylene glycol (MIRALAX / GLYCOLAX) 17 g packet Take 17 g by mouth daily as needed for mild constipation. 14 each 0  ? pravastatin  (PRAVACHOL) 40 MG tablet Take 1 tablet (40 mg total) by mouth at bedtime. 30 tablet 5  ? traMADol (ULTRAM) 50 MG tablet Take 50 mg by mouth every 6 (six) hours as needed (for migraines).    ? zinc sulfate 220 (5

## 2021-03-29 NOTE — Patient Instructions (Signed)
You are here for follow up of left foot infection- you completed 6 weeks of IV and will go on Augmentin ( amoxicillin/clavulunate) 875mg  Po BID for 30 days( may need more_) until you see plastic surgery and they close the wound ?

## 2021-03-30 ENCOUNTER — Encounter (INDEPENDENT_AMBULATORY_CARE_PROVIDER_SITE_OTHER): Payer: 59

## 2021-03-30 ENCOUNTER — Ambulatory Visit (INDEPENDENT_AMBULATORY_CARE_PROVIDER_SITE_OTHER): Payer: 59 | Admitting: Vascular Surgery

## 2021-04-03 ENCOUNTER — Ambulatory Visit (INDEPENDENT_AMBULATORY_CARE_PROVIDER_SITE_OTHER): Payer: 59 | Admitting: Plastic Surgery

## 2021-04-03 ENCOUNTER — Encounter: Payer: Self-pay | Admitting: Plastic Surgery

## 2021-04-03 ENCOUNTER — Other Ambulatory Visit: Payer: Self-pay

## 2021-04-03 VITALS — BP 136/83 | HR 87 | Ht 69.0 in | Wt 241.0 lb

## 2021-04-03 DIAGNOSIS — L97424 Non-pressure chronic ulcer of left heel and midfoot with necrosis of bone: Secondary | ICD-10-CM

## 2021-04-03 DIAGNOSIS — E11621 Type 2 diabetes mellitus with foot ulcer: Secondary | ICD-10-CM | POA: Diagnosis not present

## 2021-04-03 NOTE — Progress Notes (Signed)
? ?  Patient ID: Nancy [name], female    DOB: 19-May-1965, 56 y.o.   MRN: [id number] ? ? ?Chief Complaint  ?Patient presents with  ? Consult  ? ? ?The patient is a 56 year old female here for evaluation of her left heel.  She had a blister in November which quickly developed into infection.  She was seen in [place] by an orthopedist and underwent debridement.  She does have diabetes which is now under better control.  She is 5 feet 9 inches tall and weighs 241 pounds.  The wound is 5 x 9 cm.  She has been using the [facility] since February 11.  She has some granulation tissue and does not appear to have active infection.  She did have resection of some bone in her Achilles tendon. ? ? ?Review of Systems  ?Constitutional:  Positive for activity change. Negative for appetite change.  ?Eyes: Negative.   ?Respiratory: Negative.    ?Cardiovascular:  Positive for leg swelling.  ?Gastrointestinal: Negative.   ?Endocrine: Negative.   ?Genitourinary: Negative.   ?Musculoskeletal:  Positive for gait problem.  ?Skin:  Positive for wound.  ? ?Past Medical History:  ?Diagnosis Date  ? Anxiety   ? Arthritis   ? r knee  ? Asthma   ? CAD (coronary artery disease)   ? a.) LHC 06/04/2009 at [facility]; non-obstructive CAD. b.) CTA with FFR 10/08/2018: extensive mixed plaque proximal to mid LAD (51-69%); Coronary Ca score 217; FFR 0.71 dPDA, 0.86 mLAD, 0.87 dLCx.  ? CCF (congestive cardiac failure) ([place]) 06/06/2008  ? a.) 30% EF. b.) TTE 06/03/2011: EF >55%; triv MR. c.) TTE 04/27/2018: EF 55%; mild LVH; triv PR, mild MR/TR; G1DD.  ? Complication of anesthesia   ? Eczema   ? Family history of adverse reaction to anesthesia   ? a.) PONV in mother and grandmother  ? GERD (gastroesophageal reflux disease) 05/10/2014  ? History of kidney stones   ? HLD (hyperlipidemia)   ? Migraines   ? Motion sickness   ? Panic attacks   ? Pneumonia   ? PONV (postoperative nausea and vomiting)   ? T2DM (type 2 diabetes mellitus) ([place])   ? Takotsubo  cardiomyopathy / transient apical balooning syndrome / stress-induced cardiomyopathy 05/10/2014  ? Unspecified essential hypertension   ?  ?Past Surgical History:  ?Procedure Laterality Date  ? AMPUTATION TOE Left 03/16/2015  ? Procedure: left fifth toe amputation with incision and drainage;  Surgeon: Samara Deist, DPM;  Location: [facility];  Service: Podiatry;  Laterality: Left;  ? APPLICATION OF WOUND VAC Left 02/14/2021  ? Procedure: APPLICATION OF WOUND VAC;  Surgeon: Samara Deist, DPM;  Location: [facility];  Service: Podiatry;  Laterality: Left;  ? CARDIAC CATHETERIZATION Left 06/04/2009  ? Procedure: CARDIAC CATHETERIZATION; Location: [facility]  ? CHOLECYSTECTOMY    ? IRRIGATION AND DEBRIDEMENT FOOT Left 02/08/2021  ? Procedure: IRRIGATION AND DEBRIDEMENT FOOT - LFT HEEL ULCER;  Surgeon: Samara Deist, DPM;  Location: [facility];  Service: Podiatry;  Laterality: Left;  ? IRRIGATION AND DEBRIDEMENT FOOT Left 02/14/2021  ? Procedure: IRRIGATION AND DEBRIDEMENT LEFT HEEL;  Surgeon: Samara Deist, DPM;  Location: [facility];  Service: Podiatry;  Laterality: Left;  ? kidney stone removal    ? KNEE ARTHROSCOPY W/ MENISCAL REPAIR    ? NOSE SURGERY  01/07/1989  ? due to fracture  ? TOE AMPUTATION    ? second toe  ? TONSILLECTOMY    ? TOTAL KNEE ARTHROPLASTY  01/11/2011  ?  Procedure: TOTAL KNEE ARTHROPLASTY;  Surgeon: Alta Corning;  Location: [facility];  Service: Orthopedics;  Laterality: Right;  COMPUTER ASSISTED TOTAL KNEE REPLACEMENT  ?  ? ? ?Current Outpatient Medications:  ?  acidophilus (RISAQUAD) CAPS capsule, Take 1 capsule by mouth daily., Disp: , Rfl:  ?  albuterol (PROVENTIL HFA;VENTOLIN HFA) 108 (90 Base) MCG/ACT inhaler, Inhale 2 puffs into the lungs every 4 (four) hours as needed for wheezing or shortness of breath. Reported on 03/29/2015, Disp: 1 Inhaler, Rfl: 3 ?  amoxicillin-clavulanate (AUGMENTIN) 875-125 MG tablet, Take 1 tablet by mouth 2 (two) times daily., Disp: 60 tablet, Rfl: 1 ?  ascorbic  acid (VITAMIN C) 500 MG tablet, Take 1 tablet (500 mg total) by mouth 2 (two) times daily., Disp: 60 tablet, Rfl: 0 ?  aspirin EC 81 MG tablet, Take 81 mg by mouth daily., Disp: , Rfl:  ?  aspirin-acetaminophen-caffeine (EXCEDRIN MIGRAINE) 250-250-65 MG tablet, Take 2 tablets by mouth every 6 (six) hours as needed for migraine., Disp: , Rfl:  ?  carvedilol (COREG) 3.125 MG tablet, Take 3.125 mg by mouth 2 (two) times daily with a meal.  , Disp: , Rfl:  ?  Continuous Blood Gluc Sensor (FREESTYLE LIBRE 2 SENSOR) MISC, APPLY ONE SENSOR EVERY 2 WEEKS., Disp: , Rfl:  ?  desvenlafaxine (PRISTIQ) 50 MG 24 hr tablet, Take 50 mg by mouth daily., Disp: , Rfl:  ?  docusate sodium (COLACE) 100 MG capsule, Take 1 capsule (100 mg total) by mouth 2 (two) times daily., Disp: 30 capsule, Rfl: 0 ?  fenofibrate 54 MG tablet, Take 54 mg by mouth daily., Disp: , Rfl:  ?  HYDROmorphone (DILAUDID) 2 MG tablet, Take 2 mg by mouth every 4 (four) hours as needed., Disp: , Rfl:  ?  ibuprofen (ADVIL,MOTRIN) 800 MG tablet, Take 800 mg by mouth every 8 (eight) hours as needed (for migraines). , Disp: , Rfl:  ?  lisinopril (ZESTRIL) 5 MG tablet, Take 5 mg by mouth 2 (two) times daily., Disp: , Rfl:  ?  metFORMIN (GLUCOPHAGE) 500 MG tablet, Take 3 tablets (1,500 mg total) by mouth 2 (two) times daily with a meal. Pt takes two tablets with breakfast and three tablets with dinner. (Patient taking differently: Take 1,000-1,500 mg by mouth See admin instructions. Take 1500 mg by mouth in the morning and 1000 mg in the evening), Disp: 150 tablet, Rfl: 1 ?  Miconazole Nitrate 2 % POWD, 200 mg by Does not apply route 2 (two) times daily as needed., Disp: 100 g, Rfl: 1 ?  montelukast (SINGULAIR) 10 MG tablet, Take 10 mg by mouth at bedtime., Disp: , Rfl:  ?  MOUNJARO 5 MG/0.5ML Pen, Inject 5 mg into the skin every Sunday., Disp: , Rfl:  ?  nystatin (MYCOSTATIN/NYSTOP) powder, Apply 1 application topically daily as needed (irritation)., Disp: , Rfl:  ?   ondansetron (ZOFRAN) 4 MG tablet, Take 4 mg by mouth every 8 (eight) hours as needed for vomiting or nausea., Disp: , Rfl:  ?  ondansetron (ZOFRAN-ODT) 4 MG disintegrating tablet, Take 4 mg by mouth every 8 (eight) hours as needed., Disp: , Rfl:  ?  pantoprazole (PROTONIX) 40 MG tablet, Take 40 mg by mouth daily., Disp: , Rfl:  ?  polyethylene glycol (MIRALAX / GLYCOLAX) 17 g packet, Take 17 g by mouth daily as needed for mild constipation., Disp: 14 each, Rfl: 0 ?  polyethylene glycol powder (GLYCOLAX/MIRALAX) 17 GM/SCOOP powder, Take by mouth., Disp: , Rfl:  ?  pravastatin (PRAVACHOL) 40 MG tablet, Take 1 tablet (40 mg total) by mouth at bedtime., Disp: 30 tablet, Rfl: 5 ?  traMADol (ULTRAM) 50 MG tablet, Take 50 mg by mouth every 6 (six) hours as needed (for migraines)., Disp: , Rfl:  ?  zinc sulfate 220 (50 Zn) MG capsule, Take 1 capsule (220 mg total) by mouth daily., Disp: 30 capsule, Rfl: 0  ? ?Objective:  ? ?Vitals:  ? 04/03/21 1051  ?BP: 136/83  ?Pulse: 87  ?SpO2: 99%  ? ? ?Physical Exam ?Vitals reviewed.  ?Constitutional:   ?   Appearance: Normal appearance.  ?HENT:  ?   Head: Normocephalic.  ?Cardiovascular:  ?   Rate and Rhythm: Normal rate.  ?   Pulses: Normal pulses.  ?Pulmonary:  ?   Effort: Pulmonary effort is normal.  ?Abdominal:  ?   Palpations: Abdomen is soft.  ?Musculoskeletal:     ?   General: Swelling and deformity present.  ?Skin: ?   General: Skin is warm.  ?Neurological:  ?   Mental Status: She is alert and oriented to person, place, and time.  ?Psychiatric:     ?   Mood and Affect: Mood normal.     ?   Behavior: Behavior normal.     ?   Thought Content: Thought content normal.  ? ? ?Assessment & Plan:  ?Diabetic ulcer of left heel associated with type 2 diabetes mellitus, with necrosis of bone ([place]) ? ?Plan for OR for excision of left heel wound with [facility] placement as soon as possible.  Patient will need to bring [facility] and all supplies to the OR with her. ? ?Pictures were obtained of the  patient and placed in the chart with the patient's or guardian's permission. ? ? ?Loel Lofty Terell Kincy, DO ?

## 2021-04-05 ENCOUNTER — Encounter (HOSPITAL_BASED_OUTPATIENT_CLINIC_OR_DEPARTMENT_OTHER): Payer: Self-pay | Admitting: Plastic Surgery

## 2021-04-05 ENCOUNTER — Ambulatory Visit: Payer: 59 | Admitting: Infectious Diseases

## 2021-04-05 ENCOUNTER — Other Ambulatory Visit: Payer: Self-pay

## 2021-04-10 NOTE — H&P (View-Only) (Signed)
? ?  Patient ID: Nancy [name], female    DOB: [birth year], 56 y.o.   MRN: [id number] ? ?Chief Complaint  ?Patient presents with  ? Pre-op Exam  ? ? ?  ICD-10-CM   ?1. Diabetic ulcer of left heel associated with type 2 diabetes mellitus, with necrosis of bone ([place])  E11.621   ? J62.831   ?  ? ? ? ?History of Present Illness: ?Nancy [name] is a 56 y.o.  female  with a history of left heel ulceration.  She presents for preoperative evaluation for upcoming procedure, debridement of left heel wound with placement of myriad and wound VAC, scheduled for 04/16/2021 with Dr. Marla Roe. ? ?The patient reports that she has had postoperative nausea and vomiting as well as significant procedural anxiety with previous surgeries.  She will discuss with anesthesiologist on day of surgery.  She has already spoken with an OR nurse and completed preop labs yesterday.  She has been nicotine free for 3 years.  She has been continuing with protein drinks since discharge from the hospital.  She has home health nurse coming MWF to assist with her wound VAC changes.  She is currently living with her 42 year old mother.  She has a history of asthma, but well controlled and has not required use of her rescue inhaler in over a year.  Denies any varicose veins or leg swelling.  Denies any personal or family history of blood clots or clotting disorder.  She is not on any hormone replacement.  Plan is for her to hold her aspirin and Excedrin between now and time of surgery.  She reports that she has been able to do this in the past for previous surgeries.  We will still reach out to her primary care provider as well as her cardiologist for permission.  She clarifies her cardiac history, Takotsubo's cardiomyopathy 12 years ago and no cardiac issues ever since.  Plan is to hold metformin 48 hours prior to surgery which she states is fine given that her sugars are controlled with insulin.  Discussed perioperative insulin management.   She will also hold her multivitamins and supplements until after surgery.  She reports that she has reviewed the consent forms on my chart and all of her questions have been addressed. ? ?Summary of Previous Visit: Patient was seen for consult on 04/03/2021 by Dr. Marla Roe.  She reports that she had a blister in November 2022 which turned into an infected left heel ulcer.  She is s/p debridement performed by orthopedist.  Wound is approximately 5 x 9 cm.  She has been managing with wound VAC since 02/17/2021.  Discussed plan for excision of left heel wound with placement of myriad followed by wound VAC. ? 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  She also reports that she will lose her HHN if she were to return to work and her mother would not be able to assist with wound VAC changes. ? ?PMH Significant for: IDDM, Takotsubo syndrome 2011, HTN, mixed HLD, PAD, and diabetic foot ulcer.  She was evaluated by vascular surgery 01/30/2021, left leg perfusion is decent but not optimal.   ?We will try to obtain surgical clearance from her primary care provider or her cardiologist, Dr. Saralyn Pilar, at [facility].   ? ? ?Past Medical History: ?Allergies: ?Allergies  ?Allergen Reactions  ? Shellfish Allergy Anaphylaxis  ? Codeine Other (See Comments)  ?  Migraine  ? Morphine And Related  Other (See Comments)  ?  Reaction:  Severe migraines   ? Percocet [Oxycodone-Acetaminophen] Nausea And Vomiting  ? Rosuvastatin   ?  Other reaction(s): Muscle Pain  ? Penicillins Rash and Other (See Comments)  ?  Is tolerating amoxicillin so no allergies  ? Sulfa Antibiotics Rash  ? Theophyllines Itching and Rash  ? ? ?Current Medications: ? ?Current Outpatient Medications:  ?  acidophilus (RISAQUAD) CAPS capsule, Take 1 capsule by mouth daily., Disp: , Rfl:  ?  albuterol (PROVENTIL HFA;VENTOLIN HFA) 108 (90 Base) MCG/ACT inhaler, Inhale 2 puffs into the lungs every 4 (four) hours as needed for  wheezing or shortness of breath. Reported on 03/29/2015, Disp: 1 Inhaler, Rfl: 3 ?  amoxicillin-clavulanate (AUGMENTIN) 875-125 MG tablet, Take 1 tablet by mouth 2 (two) times daily., Disp: 60 tablet, Rfl: 1 ?  ascorbic acid (VITAMIN C) 500 MG tablet, Take 1 tablet (500 mg total) by mouth 2 (two) times daily., Disp: 60 tablet, Rfl: 0 ?  aspirin EC 81 MG tablet, Take 81 mg by mouth daily., Disp: , Rfl:  ?  aspirin-acetaminophen-caffeine (EXCEDRIN MIGRAINE) 250-250-65 MG tablet, Take 2 tablets by mouth every 6 (six) hours as needed for migraine., Disp: , Rfl:  ?  carvedilol (COREG) 3.125 MG tablet, Take 3.125 mg by mouth 2 (two) times daily with a meal.  , Disp: , Rfl:  ?  Continuous Blood Gluc Sensor (FREESTYLE LIBRE 2 SENSOR) MISC, APPLY ONE SENSOR EVERY 2 WEEKS., Disp: , Rfl:  ?  desvenlafaxine (PRISTIQ) 50 MG 24 hr tablet, Take 50 mg by mouth daily., Disp: , Rfl:  ?  docusate sodium (COLACE) 100 MG capsule, Take 1 capsule (100 mg total) by mouth 2 (two) times daily., Disp: 30 capsule, Rfl: 0 ?  fenofibrate 54 MG tablet, Take 54 mg by mouth daily., Disp: , Rfl:  ?  HYDROmorphone (DILAUDID) 2 MG tablet, Take 2 mg by mouth every 4 (four) hours as needed., Disp: , Rfl:  ?  ibuprofen (ADVIL,MOTRIN) 800 MG tablet, Take 800 mg by mouth every 8 (eight) hours as needed (for migraines). , Disp: , Rfl:  ?  lisinopril (ZESTRIL) 5 MG tablet, Take 5 mg by mouth 2 (two) times daily., Disp: , Rfl:  ?  metFORMIN (GLUCOPHAGE) 500 MG tablet, Take 3 tablets (1,500 mg total) by mouth 2 (two) times daily with a meal. Pt takes two tablets with breakfast and three tablets with dinner. (Patient taking differently: Take 1,000-1,500 mg by mouth See admin instructions. Take 1500 mg by mouth in the morning and 1000 mg in the evening), Disp: 150 tablet, Rfl: 1 ?  Miconazole Nitrate 2 % POWD, 200 mg by Does not apply route 2 (two) times daily as needed., Disp: 100 g, Rfl: 1 ?  montelukast (SINGULAIR) 10 MG tablet, Take 10 mg by mouth at  bedtime., Disp: , Rfl:  ?  MOUNJARO 5 MG/0.5ML Pen, Inject 5 mg into the skin every Sunday., Disp: , Rfl:  ?  nystatin (MYCOSTATIN/NYSTOP) powder, Apply 1 application topically daily as needed (irritation)., Disp: , Rfl:  ?  ondansetron (ZOFRAN) 4 MG tablet, Take 4 mg by mouth every 8 (eight) hours as needed for vomiting or nausea., Disp: , Rfl:  ?  ondansetron (ZOFRAN-ODT) 4 MG disintegrating tablet, Take 4 mg by mouth every 8 (eight) hours as needed., Disp: , Rfl:  ?  pantoprazole (PROTONIX) 40 MG tablet, Take 40 mg by mouth daily., Disp: , Rfl:  ?  polyethylene glycol (MIRALAX / GLYCOLAX) 17 g  packet, Take 17 g by mouth daily as needed for mild constipation., Disp: 14 each, Rfl: 0 ?  polyethylene glycol powder (GLYCOLAX/MIRALAX) 17 GM/SCOOP powder, Take by mouth., Disp: , Rfl:  ?  pravastatin (PRAVACHOL) 40 MG tablet, Take 1 tablet (40 mg total) by mouth at bedtime., Disp: 30 tablet, Rfl: 5 ?  traMADol (ULTRAM) 50 MG tablet, Take 50 mg by mouth every 6 (six) hours as needed (for migraines)., Disp: , Rfl:  ?  zinc sulfate 220 (50 Zn) MG capsule, Take 1 capsule (220 mg total) by mouth daily., Disp: 30 capsule, Rfl: 0 ? ?Past Medical Problems: ?Past Medical History:  ?Diagnosis Date  ? Anxiety   ? Arthritis   ? r knee  ? Asthma   ? CAD (coronary artery disease)   ? a.) LHC 06/04/2009 at [facility]; non-obstructive CAD. b.) CTA with FFR 10/08/2018: extensive mixed plaque proximal to mid LAD (51-69%); Coronary Ca score 217; FFR 0.71 dPDA, 0.86 mLAD, 0.87 dLCx.  ? CCF (congestive cardiac failure) ([place]) 06/06/2008  ? a.) 30% EF. b.) TTE 06/03/2011: EF >55%; triv MR. c.) TTE 04/27/2018: EF 55%; mild LVH; triv PR, mild MR/TR; G1DD.  ? Complication of anesthesia   ? Eczema   ? Family history of adverse reaction to anesthesia   ? a.) PONV in mother and grandmother  ? GERD (gastroesophageal reflux disease) 05/10/2014  ? History of kidney stones   ? HLD (hyperlipidemia)   ? Migraines   ? Motion sickness   ? Panic attacks   ?  Pneumonia   ? PONV (postoperative nausea and vomiting)   ? T2DM (type 2 diabetes mellitus) ([place])   ? 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 Unspecified

## 2021-04-10 NOTE — Progress Notes (Signed)
? ?  Patient ID: Nancy [name], female    DOB: 09/06/1965, 55 y.o.   MRN: [id number] ? ?Chief Complaint  ?Patient presents with  ? Pre-op Exam  ? ? ?  ICD-10-CM   ?1. 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 L97.424   ?  ? ? ? ?History of Present Illness: ?Nancy [name] is a 55 y.o.  female  with a history of left heel ulceration.  She presents for preoperative evaluation for upcoming procedure, debridement of left heel wound with placement of myriad and wound VAC, scheduled for 04/16/2021 with Dr. Dillingham. ? ?The patient reports that she has had postoperative nausea and vomiting as well as significant procedural anxiety with previous surgeries.  She will discuss with anesthesiologist on day of surgery.  She has already spoken with an OR nurse and completed preop labs yesterday.  She has been nicotine free for 3 years.  She has been continuing with protein drinks since discharge from the hospital.  She has home health nurse coming MWF to assist with her wound VAC changes.  She is currently living with her 80-year-old mother.  She has a history of asthma, but well controlled and has not required use of her rescue inhaler in over a year.  Denies any varicose veins or leg swelling.  Denies any personal or family history of blood clots or clotting disorder.  She is not on any hormone replacement.  Plan is for her to hold her aspirin and Excedrin between now and time of surgery.  She reports that she has been able to do this in the past for previous surgeries.  We will still reach out to her primary care provider as well as her cardiologist for permission.  She clarifies her cardiac history, Takotsubo's cardiomyopathy 12 years ago and no cardiac issues ever since.  Plan is to hold metformin 48 hours prior to surgery which she states is fine given that her sugars are controlled with insulin.  Discussed perioperative insulin management.   She will also hold her multivitamins and supplements until after surgery.  She reports that she has reviewed the consent forms on my chart and all of her questions have been addressed. ? ?Summary of Previous Visit: Patient was seen for consult on 04/03/2021 by Dr. Dillingham.  She reports that she had a blister in November 2022 which turned into an infected left heel ulcer.  She is s/p debridement performed by orthopedist.  Wound is approximately 5 x 9 cm.  She has been managing with wound VAC since 02/17/2021.  Discussed plan for excision of left heel wound with placement of myriad followed by wound VAC. ? 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  She also reports that she will lose her HHN if she were to return to work and her mother would not be able to assist with wound VAC changes. ? ?PMH Significant for: IDDM, Takotsubo syndrome 2011, HTN, mixed HLD, PAD, and diabetic foot ulcer.  She was evaluated by vascular surgery 01/30/2021, left leg perfusion is decent but not optimal.   ?We will try to obtain surgical clearance from her primary care provider or her cardiologist, Dr. Paraschos, at [facility].   ? ? ?Past Medical History: ?Allergies: ?Allergies  ?Allergen Reactions  ? Shellfish Allergy Anaphylaxis  ? Codeine Other (See Comments)  ?  Migraine  ? Morphine And Related   Other (See Comments)  ?  Reaction:  Severe migraines   ? Percocet [Oxycodone-Acetaminophen] Nausea And Vomiting  ? Rosuvastatin   ?  Other reaction(s): Muscle Pain  ? Penicillins Rash and Other (See Comments)  ?  Is tolerating amoxicillin so no allergies  ? Sulfa Antibiotics Rash  ? Theophyllines Itching and Rash  ? ? ?Current Medications: ? ?Current Outpatient Medications:  ?  acidophilus (RISAQUAD) CAPS capsule, Take 1 capsule by mouth daily., Disp: , Rfl:  ?  albuterol (PROVENTIL HFA;VENTOLIN HFA) 108 (90 Base) MCG/ACT inhaler, Inhale 2 puffs into the lungs every 4 (four) hours as needed for  wheezing or shortness of breath. Reported on 03/29/2015, Disp: 1 Inhaler, Rfl: 3 ?  amoxicillin-clavulanate (AUGMENTIN) 875-125 MG tablet, Take 1 tablet by mouth 2 (two) times daily., Disp: 60 tablet, Rfl: 1 ?  ascorbic acid (VITAMIN C) 500 MG tablet, Take 1 tablet (500 mg total) by mouth 2 (two) times daily., Disp: 60 tablet, Rfl: 0 ?  aspirin EC 81 MG tablet, Take 81 mg by mouth daily., Disp: , Rfl:  ?  aspirin-acetaminophen-caffeine (EXCEDRIN MIGRAINE) 250-250-65 MG tablet, Take 2 tablets by mouth every 6 (six) hours as needed for migraine., Disp: , Rfl:  ?  carvedilol (COREG) 3.125 MG tablet, Take 3.125 mg by mouth 2 (two) times daily with a meal.  , Disp: , Rfl:  ?  Continuous Blood Gluc Sensor (FREESTYLE LIBRE 2 SENSOR) MISC, APPLY ONE SENSOR EVERY 2 WEEKS., Disp: , Rfl:  ?  desvenlafaxine (PRISTIQ) 50 MG 24 hr tablet, Take 50 mg by mouth daily., Disp: , Rfl:  ?  docusate sodium (COLACE) 100 MG capsule, Take 1 capsule (100 mg total) by mouth 2 (two) times daily., Disp: 30 capsule, Rfl: 0 ?  fenofibrate 54 MG tablet, Take 54 mg by mouth daily., Disp: , Rfl:  ?  HYDROmorphone (DILAUDID) 2 MG tablet, Take 2 mg by mouth every 4 (four) hours as needed., Disp: , Rfl:  ?  ibuprofen (ADVIL,MOTRIN) 800 MG tablet, Take 800 mg by mouth every 8 (eight) hours as needed (for migraines). , Disp: , Rfl:  ?  lisinopril (ZESTRIL) 5 MG tablet, Take 5 mg by mouth 2 (two) times daily., Disp: , Rfl:  ?  metFORMIN (GLUCOPHAGE) 500 MG tablet, Take 3 tablets (1,500 mg total) by mouth 2 (two) times daily with a meal. Pt takes two tablets with breakfast and three tablets with dinner. (Patient taking differently: Take 1,000-1,500 mg by mouth See admin instructions. Take 1500 mg by mouth in the morning and 1000 mg in the evening), Disp: 150 tablet, Rfl: 1 ?  Miconazole Nitrate 2 % POWD, 200 mg by Does not apply route 2 (two) times daily as needed., Disp: 100 g, Rfl: 1 ?  montelukast (SINGULAIR) 10 MG tablet, Take 10 mg by mouth at  bedtime., Disp: , Rfl:  ?  MOUNJARO 5 MG/0.5ML Pen, Inject 5 mg into the skin every Sunday., Disp: , Rfl:  ?  nystatin (MYCOSTATIN/NYSTOP) powder, Apply 1 application topically daily as needed (irritation)., Disp: , Rfl:  ?  ondansetron (ZOFRAN) 4 MG tablet, Take 4 mg by mouth every 8 (eight) hours as needed for vomiting or nausea., Disp: , Rfl:  ?  ondansetron (ZOFRAN-ODT) 4 MG disintegrating tablet, Take 4 mg by mouth every 8 (eight) hours as needed., Disp: , Rfl:  ?  pantoprazole (PROTONIX) 40 MG tablet, Take 40 mg by mouth daily., Disp: , Rfl:  ?  polyethylene glycol (MIRALAX / GLYCOLAX) 17 g  packet, Take 17 g by mouth daily as needed for mild constipation., Disp: 14 each, Rfl: 0 ?  polyethylene glycol powder (GLYCOLAX/MIRALAX) 17 GM/SCOOP powder, Take by mouth., Disp: , Rfl:  ?  pravastatin (PRAVACHOL) 40 MG tablet, Take 1 tablet (40 mg total) by mouth at bedtime., Disp: 30 tablet, Rfl: 5 ?  traMADol (ULTRAM) 50 MG tablet, Take 50 mg by mouth every 6 (six) hours as needed (for migraines)., Disp: , Rfl:  ?  zinc sulfate 220 (50 Zn) MG capsule, Take 1 capsule (220 mg total) by mouth daily., Disp: 30 capsule, Rfl: 0 ? ?Past Medical Problems: ?Past Medical History:  ?Diagnosis Date  ? Anxiety   ? Arthritis   ? r knee  ? Asthma   ? CAD (coronary artery disease)   ? a.) LHC 06/04/2009 at [facility]; non-obstructive CAD. b.) CTA with FFR 10/08/2018: extensive mixed plaque proximal to mid LAD (51-69%); Coronary Ca score 217; FFR 0.71 dPDA, 0.86 mLAD, 0.87 dLCx.  ? CCF (congestive cardiac failure) ([place]) 06/06/2008  ? a.) 30% EF. b.) TTE 06/03/2011: EF >55%; triv MR. c.) TTE 04/27/2018: EF 55%; mild LVH; triv PR, mild MR/TR; G1DD.  ? Complication of anesthesia   ? Eczema   ? Family history of adverse reaction to anesthesia   ? a.) PONV in mother and grandmother  ? GERD (gastroesophageal reflux disease) 05/10/2014  ? History of kidney stones   ? HLD (hyperlipidemia)   ? Migraines   ? Motion sickness   ? Panic attacks   ?  Pneumonia   ? PONV (postoperative nausea and vomiting)   ? T2DM (type 2 diabetes mellitus) ([place])   ? 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 Unspecified

## 2021-04-11 ENCOUNTER — Encounter (HOSPITAL_BASED_OUTPATIENT_CLINIC_OR_DEPARTMENT_OTHER)
Admission: RE | Admit: 2021-04-11 | Discharge: 2021-04-11 | Disposition: A | Discharge status: Home / Self Care | Payer: 59 | Source: Ambulatory Visit | Attending: Plastic Surgery | Admitting: Plastic Surgery

## 2021-04-11 DIAGNOSIS — Z01812 Encounter for preprocedural laboratory examination: Secondary | ICD-10-CM | POA: Insufficient documentation

## 2021-04-11 LAB — BASIC METABOLIC PANEL
Anion gap: 8 (ref 5–15)
BUN: 21 mg/dL — ABNORMAL HIGH (ref 6–20)
CO2: 26 mmol/L (ref 22–32)
Calcium: 9.4 mg/dL (ref 8.9–10.3)
Chloride: 104 mmol/L (ref 98–111)
Creatinine, Ser: 0.92 mg/dL (ref 0.44–1.00)
GFR, Estimated: >60 mL/min (ref >60)
Glucose, Bld: 150 mg/dL — ABNORMAL HIGH (ref 70–99)
Potassium: 4.7 mmol/L (ref 3.5–5.1)
Sodium: 138 mmol/L (ref 135–145)

## 2021-04-12 ENCOUNTER — Telehealth (INDEPENDENT_AMBULATORY_CARE_PROVIDER_SITE_OTHER): Payer: 59 | Admitting: Physician Assistant

## 2021-04-12 ENCOUNTER — Telehealth: Payer: 59 | Admitting: Physician Assistant

## 2021-04-12 DIAGNOSIS — E11621 Type 2 diabetes mellitus with foot ulcer: Secondary | ICD-10-CM

## 2021-04-12 DIAGNOSIS — L97424 Non-pressure chronic ulcer of left heel and midfoot with necrosis of bone: Secondary | ICD-10-CM

## 2021-04-12 MED ORDER — DOXYCYCLINE HYCLATE 100 MG PO TABS: 100.0000 mg | ORAL_TABLET | Freq: Two times a day (BID) | ORAL | 0 refills | Status: AC

## 2021-04-12 NOTE — Anesthesia Preprocedure Evaluation (Addendum)
Anesthesia Evaluation  ?Patient identified by MRN, date of birth, ID band ?Patient awake ? ? ? ?Reviewed: ?Allergy & Precautions, NPO status , Patient's Chart, lab work & pertinent test results, reviewed documented beta blocker date and time  ? ?History of Anesthesia Complications ?(+) PONV and history of anesthetic complications (PONV, anxiety w/ anesthesia ) ? ?Airway ?Mallampati: III ? ?TM Distance: >3 FB ?Neck ROM: Full ? ? ? Dental ? ?(+) Teeth Intact, Dental Advisory Given ?  ?Pulmonary ?asthma (well controlled) , former smoker,  ?  ?Pulmonary exam normal ?breath sounds clear to auscultation ? ? ? ? ? ? Cardiovascular ?hypertension (164/87 in preop, per pt can sometimes be high ), Pt. on medications and Pt. on home beta blockers ?+ CAD, + Peripheral Vascular Disease and +CHF (hx low EF now resolved)  ?Normal cardiovascular exam ?Rhythm:Regular Rate:Normal ? ?Echo 2020 @ [facility] ?NORMAL LEFT VENTRICULAR SYSTOLIC FUNCTION WITH AN ESTIMATED EF = >55 %  ?NORMAL RIGHT VENTRICULAR SYSTOLIC FUNCTION  ?MILD TRICUSPID AND MITRAL VALVE INSUFFICIENCY  ?NO VALVULAR STENOSIS  ?MILD LVH  ? ? ?Cardiac CTA 2020 ?1. Coronary artery calcium score 217 Agatston units. This places the ?patient in the 99th percentile for age and gender. ?? ?2. Possible moderate proximal to mid LAD stenosis. Will send for FFR ?to confirm. ?FFR ruled out stenosis  ?  ?Neuro/Psych ? Headaches, PSYCHIATRIC DISORDERS Anxiety Depression  Neuromuscular disease (peripheral neuropathy)   ? GI/Hepatic ?Neg liver ROS, GERD  Controlled and Medicated,  ?Endo/Other  ?diabetes, Poorly Controlled, Type 2, Oral Hypoglycemic AgentsObesity BMI 36 ?FS 128 in preop ? Renal/GU ?negative Renal ROS  ?negative genitourinary ?  ?Musculoskeletal ? ?(+) Arthritis , Diabetic ulcer L heel   ? Abdominal ?(+) + obese,   ?Peds ? Hematology ?negative hematology ROS ?(+)   ?Anesthesia Other Findings ? ? Reproductive/Obstetrics ?negative OB ROS ? ?   ? ? ? ? ? ? ? ? ? ? ? ? ? ?  ?  ? ? ? ? ? ? ?Anesthesia Physical ?Anesthesia Plan ? ?ASA: 3 ? ?Anesthesia Plan: MAC and Regional  ? ?Post-op Pain Management: Regional block* and Tylenol PO (pre-op)*  ? ?Induction: Intravenous ? ?PONV Risk Score and Plan: 3 and Propofol infusion, TIVA and Treatment may vary due to age or medical condition ? ?Airway Management Planned: Natural Airway and Simple Face Mask ? ?Additional Equipment: None ? ?Intra-op Plan:  ? ?Post-operative Plan:  ? ?Informed Consent: I have reviewed the patients History and Physical, chart, labs and discussed the procedure including the risks, benefits and alternatives for the proposed anesthesia with the patient or authorized representative who has indicated his/her understanding and acceptance.  ? ? ? ?Dental advisory given ? ?Plan Discussed with: CRNA ? ?Anesthesia Plan Comments: (Per pt severe PONV- will do regional/MAC to avoid GA and narcotics  ?If pain meds are needed, prefers dilaudid)  ? ? ? ? ? ?Anesthesia Quick Evaluation ? ?

## 2021-04-16 ENCOUNTER — Ambulatory Visit (HOSPITAL_BASED_OUTPATIENT_CLINIC_OR_DEPARTMENT_OTHER)
Admission: RE | Admit: 2021-04-16 | Discharge: 2021-04-16 | Disposition: A | Discharge status: Home Health | Payer: 59 | Attending: Plastic Surgery | Admitting: Plastic Surgery

## 2021-04-16 ENCOUNTER — Ambulatory Visit (HOSPITAL_BASED_OUTPATIENT_CLINIC_OR_DEPARTMENT_OTHER): Payer: 59 | Admitting: Certified Registered"

## 2021-04-16 ENCOUNTER — Other Ambulatory Visit: Payer: Self-pay | Admitting: Surgical

## 2021-04-16 ENCOUNTER — Other Ambulatory Visit: Payer: Self-pay

## 2021-04-16 ENCOUNTER — Encounter (HOSPITAL_BASED_OUTPATIENT_CLINIC_OR_DEPARTMENT_OTHER)
Admission: RE | Disposition: A | Discharge status: Home Health | Payer: Self-pay | Source: Home / Self Care | Attending: Plastic Surgery

## 2021-04-16 ENCOUNTER — Encounter (HOSPITAL_BASED_OUTPATIENT_CLINIC_OR_DEPARTMENT_OTHER): Payer: Self-pay | Admitting: Plastic Surgery

## 2021-04-16 DIAGNOSIS — I509 Heart failure, unspecified: Secondary | ICD-10-CM | POA: Insufficient documentation

## 2021-04-16 DIAGNOSIS — J45909 Unspecified asthma, uncomplicated: Secondary | ICD-10-CM | POA: Diagnosis not present

## 2021-04-16 DIAGNOSIS — F419 Anxiety disorder, unspecified: Secondary | ICD-10-CM | POA: Insufficient documentation

## 2021-04-16 DIAGNOSIS — Z7984 Long term (current) use of oral hypoglycemic drugs: Secondary | ICD-10-CM | POA: Insufficient documentation

## 2021-04-16 DIAGNOSIS — E1151 Type 2 diabetes mellitus with diabetic peripheral angiopathy without gangrene: Secondary | ICD-10-CM | POA: Diagnosis not present

## 2021-04-16 DIAGNOSIS — Z794 Long term (current) use of insulin: Secondary | ICD-10-CM | POA: Diagnosis not present

## 2021-04-16 DIAGNOSIS — E11621 Type 2 diabetes mellitus with foot ulcer: Secondary | ICD-10-CM | POA: Insufficient documentation

## 2021-04-16 DIAGNOSIS — I110 Hypertensive heart disease with heart failure: Secondary | ICD-10-CM | POA: Diagnosis not present

## 2021-04-16 DIAGNOSIS — I2510 Atherosclerotic heart disease of native coronary artery without angina pectoris: Secondary | ICD-10-CM | POA: Diagnosis not present

## 2021-04-16 DIAGNOSIS — E669 Obesity, unspecified: Secondary | ICD-10-CM | POA: Diagnosis not present

## 2021-04-16 DIAGNOSIS — Z6836 Body mass index (BMI) 36.0-36.9, adult: Secondary | ICD-10-CM | POA: Insufficient documentation

## 2021-04-16 DIAGNOSIS — Z87891 Personal history of nicotine dependence: Secondary | ICD-10-CM | POA: Insufficient documentation

## 2021-04-16 DIAGNOSIS — E1165 Type 2 diabetes mellitus with hyperglycemia: Secondary | ICD-10-CM | POA: Diagnosis not present

## 2021-04-16 DIAGNOSIS — K219 Gastro-esophageal reflux disease without esophagitis: Secondary | ICD-10-CM | POA: Insufficient documentation

## 2021-04-16 DIAGNOSIS — S91302A Unspecified open wound, left foot, initial encounter: Secondary | ICD-10-CM

## 2021-04-16 DIAGNOSIS — L97429 Non-pressure chronic ulcer of left heel and midfoot with unspecified severity: Secondary | ICD-10-CM | POA: Diagnosis not present

## 2021-04-16 DIAGNOSIS — Z7982 Long term (current) use of aspirin: Secondary | ICD-10-CM | POA: Insufficient documentation

## 2021-04-16 DIAGNOSIS — E1100 Type 2 diabetes mellitus with hyperosmolarity without nonketotic hyperglycemic-hyperosmolar coma (NKHHC): Secondary | ICD-10-CM

## 2021-04-16 DIAGNOSIS — L97424 Non-pressure chronic ulcer of left heel and midfoot with necrosis of bone: Secondary | ICD-10-CM | POA: Insufficient documentation

## 2021-04-16 HISTORY — PX: APPLICATION OF WOUND VAC: SHX5189

## 2021-04-16 HISTORY — PX: DEBRIDEMENT AND CLOSURE WOUND: SHX5614

## 2021-04-16 LAB — GLUCOSE, CAPILLARY
Glucose-Capillary: 128 mg/dL — ABNORMAL HIGH (ref 70–99)
Glucose-Capillary: 139 mg/dL — ABNORMAL HIGH (ref 70–99)

## 2021-04-16 SURGERY — DEBRIDEMENT, WOUND, WITH CLOSURE
Anesthesia: Monitor Anesthesia Care | Site: Heel | Laterality: Left

## 2021-04-16 MED ORDER — FENTANYL CITRATE (PF) 100 MCG/2ML IJ SOLN
100.0000 ug | Freq: Once | INTRAMUSCULAR | Status: AC
  Administered 2021-04-16: 100 ug via INTRAVENOUS

## 2021-04-16 MED ORDER — OXYCODONE HCL 5 MG PO TABS: 5.0000 mg | ORAL_TABLET | ORAL | Status: DC | PRN

## 2021-04-16 MED ORDER — MIDAZOLAM HCL 5 MG/5ML IJ SOLN
INTRAMUSCULAR | Status: DC | PRN
  Administered 2021-04-16 (×2): 1 mg via INTRAVENOUS

## 2021-04-16 MED ORDER — ACETAMINOPHEN 500 MG PO TABS
ORAL_TABLET | ORAL | Status: AC
  Filled 2021-04-16: qty 2

## 2021-04-16 MED ORDER — LACTATED RINGERS IV SOLN: INTRAVENOUS | Status: DC

## 2021-04-16 MED ORDER — CEFAZOLIN SODIUM-DEXTROSE 2-4 GM/100ML-% IV SOLN
INTRAVENOUS | Status: AC
  Filled 2021-04-16: qty 100

## 2021-04-16 MED ORDER — PROPOFOL 500 MG/50ML IV EMUL
INTRAVENOUS | Status: DC | PRN
Start: 2021-04-16 — End: 2021-04-16
  Administered 2021-04-16: 75 ug/kg/min via INTRAVENOUS

## 2021-04-16 MED ORDER — SODIUM CHLORIDE 0.9% FLUSH: 3.0000 mL | INTRAVENOUS | Status: DC | PRN

## 2021-04-16 MED ORDER — AMISULPRIDE (ANTIEMETIC) 5 MG/2ML IV SOLN: 10.0000 mg | Freq: Once | INTRAVENOUS | Status: DC | PRN

## 2021-04-16 MED ORDER — ROPIVACAINE HCL 5 MG/ML IJ SOLN
INTRAMUSCULAR | Status: DC | PRN
  Administered 2021-04-16: 40 mL via PERINEURAL

## 2021-04-16 MED ORDER — MIDAZOLAM HCL 2 MG/2ML IJ SOLN
INTRAMUSCULAR | Status: AC
  Filled 2021-04-16: qty 2

## 2021-04-16 MED ORDER — PROPOFOL 10 MG/ML IV BOLUS
INTRAVENOUS | Status: AC
  Filled 2021-04-16: qty 20

## 2021-04-16 MED ORDER — HYDROCODONE-ACETAMINOPHEN 7.5-325 MG PO TABS: 1.0000 | ORAL_TABLET | Freq: Once | ORAL | Status: DC | PRN

## 2021-04-16 MED ORDER — CHLORHEXIDINE GLUCONATE CLOTH 2 % EX PADS: 6.0000 | MEDICATED_PAD | Freq: Once | CUTANEOUS | Status: DC

## 2021-04-16 MED ORDER — CEFAZOLIN SODIUM-DEXTROSE 2-4 GM/100ML-% IV SOLN
2.0000 g | INTRAVENOUS | Status: AC
  Administered 2021-04-16: 2 g via INTRAVENOUS

## 2021-04-16 MED ORDER — FENTANYL CITRATE (PF) 100 MCG/2ML IJ SOLN
INTRAMUSCULAR | Status: AC
  Filled 2021-04-16: qty 2

## 2021-04-16 MED ORDER — FENTANYL CITRATE (PF) 100 MCG/2ML IJ SOLN: 25.0000 ug | INTRAMUSCULAR | Status: DC | PRN

## 2021-04-16 MED ORDER — MIDAZOLAM HCL 2 MG/2ML IJ SOLN
2.0000 mg | Freq: Once | INTRAMUSCULAR | Status: AC
Start: 2021-04-16 — End: 2021-04-16
  Administered 2021-04-16: 2 mg via INTRAVENOUS

## 2021-04-16 MED ORDER — SODIUM CHLORIDE 0.9% FLUSH: 3.0000 mL | Freq: Two times a day (BID) | INTRAVENOUS | Status: DC

## 2021-04-16 MED ORDER — ACETAMINOPHEN 500 MG PO TABS
1000.0000 mg | ORAL_TABLET | Freq: Once | ORAL | Status: AC
  Administered 2021-04-16: 1000 mg via ORAL

## 2021-04-16 MED ORDER — SODIUM CHLORIDE 0.9 % IV SOLN: 250.0000 mL | INTRAVENOUS | Status: DC | PRN

## 2021-04-16 MED ORDER — TRAMADOL HCL 50 MG PO TABS: 50.0000 mg | ORAL_TABLET | Freq: Four times a day (QID) | ORAL | 0 refills | Status: AC | PRN

## 2021-04-16 MED ORDER — ONDANSETRON HCL 4 MG/2ML IJ SOLN: 4.0000 mg | Freq: Once | INTRAMUSCULAR | Status: DC | PRN

## 2021-04-16 MED ORDER — ONDANSETRON HCL 4 MG/2ML IJ SOLN
INTRAMUSCULAR | Status: AC
  Filled 2021-04-16: qty 2

## 2021-04-16 MED ORDER — PROPOFOL 10 MG/ML IV BOLUS
INTRAVENOUS | Status: DC | PRN
  Administered 2021-04-16: 30 mg via INTRAVENOUS

## 2021-04-16 MED ORDER — 0.9 % SODIUM CHLORIDE (POUR BTL) OPTIME
TOPICAL | Status: DC | PRN
  Administered 2021-04-16: 700 mL

## 2021-04-16 MED ORDER — DEXAMETHASONE SODIUM PHOSPHATE 10 MG/ML IJ SOLN
INTRAMUSCULAR | Status: DC | PRN
  Administered 2021-04-16: 10 mg

## 2021-04-16 SURGICAL SUPPLY — 92 items
ADH SKN CLS APL DERMABOND .7 (GAUZE/BANDAGES/DRESSINGS)
APL SKNCLS STERI-STRIP NONHPOA (GAUZE/BANDAGES/DRESSINGS)
BAG DECANTER FOR FLEXI CONT (MISCELLANEOUS) IMPLANT
BENZOIN TINCTURE PRP APPL 2/3 (GAUZE/BANDAGES/DRESSINGS) IMPLANT
BLADE CLIPPER SURG (BLADE) IMPLANT
BLADE HEX COATED 2.75 (ELECTRODE) ×3 IMPLANT
BLADE MINI RND TIP GREEN BEAV (BLADE) IMPLANT
BLADE SURG 10 STRL SS (BLADE) ×1 IMPLANT
BLADE SURG 15 STRL LF DISP TIS (BLADE) ×2 IMPLANT
BLADE SURG 15 STRL SS (BLADE) ×2
BNDG COHESIVE 4X5 TAN ST LF (GAUZE/BANDAGES/DRESSINGS) IMPLANT
BNDG ELASTIC 3X5.8 VLCR STR LF (GAUZE/BANDAGES/DRESSINGS) IMPLANT
BNDG ELASTIC 4X5.8 VLCR STR LF (GAUZE/BANDAGES/DRESSINGS) ×2 IMPLANT
BNDG ELASTIC 6X5.8 VLCR STR LF (GAUZE/BANDAGES/DRESSINGS) IMPLANT
BNDG GAUZE ELAST 4 BULKY (GAUZE/BANDAGES/DRESSINGS) IMPLANT
CANISTER SUCT 1200ML W/VALVE (MISCELLANEOUS) IMPLANT
CORD BIPOLAR FORCEPS 12FT (ELECTRODE) ×1 IMPLANT
COVER BACK TABLE 60X90IN (DRAPES) ×3 IMPLANT
COVER MAYO STAND STRL (DRAPES) ×3 IMPLANT
DERMABOND ADVANCED (GAUZE/BANDAGES/DRESSINGS)
DERMABOND ADVANCED .7 DNX12 (GAUZE/BANDAGES/DRESSINGS) IMPLANT
DRAIN CHANNEL 19F RND (DRAIN) IMPLANT
DRAIN PENROSE .5X12 LATEX STL (DRAIN) IMPLANT
DRAPE INCISE IOBAN 66X45 STRL (DRAPES) ×1 IMPLANT
DRAPE LAPAROSCOPIC ABDOMINAL (DRAPES) IMPLANT
DRAPE LAPAROTOMY 100X72 PEDS (DRAPES) IMPLANT
DRAPE SURG 17X23 STRL (DRAPES) IMPLANT
DRAPE U-SHAPE 76X120 STRL (DRAPES) IMPLANT
DRSG ADAPTIC 3X8 NADH LF (GAUZE/BANDAGES/DRESSINGS) IMPLANT
DRSG CUTIMED SORBACT 7X9 (GAUZE/BANDAGES/DRESSINGS) ×1 IMPLANT
DRSG EMULSION OIL 3X3 NADH (GAUZE/BANDAGES/DRESSINGS) IMPLANT
DRSG HYDROCOLLOID 4X4 (GAUZE/BANDAGES/DRESSINGS) IMPLANT
DRSG PAD ABDOMINAL 8X10 ST (GAUZE/BANDAGES/DRESSINGS) IMPLANT
DRSG TEGADERM 4X10 (GAUZE/BANDAGES/DRESSINGS) IMPLANT
DRSG TELFA 3X8 NADH (GAUZE/BANDAGES/DRESSINGS) IMPLANT
DRSG VAC ATS MED SENSATRAC (GAUZE/BANDAGES/DRESSINGS) ×1 IMPLANT
ELECT NDL TIP 2.8 STRL (NEEDLE) IMPLANT
ELECT NEEDLE TIP 2.8 STRL (NEEDLE) IMPLANT
ELECT REM PT RETURN 9FT ADLT (ELECTROSURGICAL) ×2
ELECTRODE REM PT RTRN 9FT ADLT (ELECTROSURGICAL) ×2 IMPLANT
EVACUATOR SILICONE 100CC (DRAIN) IMPLANT
GAUZE SPONGE 4X4 12PLY STRL (GAUZE/BANDAGES/DRESSINGS) ×3 IMPLANT
GAUZE SPONGE 4X4 12PLY STRL LF (GAUZE/BANDAGES/DRESSINGS) IMPLANT
GAUZE XEROFORM 5X9 LF (GAUZE/BANDAGES/DRESSINGS) IMPLANT
GLOVE BIO SURGEON STRL SZ 6.5 (GLOVE) ×6 IMPLANT
GOWN STRL REUS W/ TWL LRG LVL3 (GOWN DISPOSABLE) ×4 IMPLANT
GOWN STRL REUS W/TWL LRG LVL3 (GOWN DISPOSABLE) ×4
GRAFT MYRIAD 3 LAYER 7X10 (Graft) ×1 IMPLANT
NDL HYPO 25X1 1.5 SAFETY (NEEDLE) ×2 IMPLANT
NDL HYPO 27GX1-1/4 (NEEDLE) IMPLANT
NEEDLE HYPO 25X1 1.5 SAFETY (NEEDLE) ×2 IMPLANT
NEEDLE HYPO 27GX1-1/4 (NEEDLE) IMPLANT
NS IRRIG 1000ML POUR BTL (IV SOLUTION) ×3 IMPLANT
PACK BASIN DAY SURGERY FS (CUSTOM PROCEDURE TRAY) ×3 IMPLANT
PAD DRESSING TELFA 3X8 NADH (GAUZE/BANDAGES/DRESSINGS) IMPLANT
PADDING CAST ABS 3INX4YD NS (CAST SUPPLIES)
PADDING CAST ABS 4INX4YD NS (CAST SUPPLIES)
PADDING CAST ABS COTTON 3X4 (CAST SUPPLIES) IMPLANT
PADDING CAST ABS COTTON 4X4 ST (CAST SUPPLIES) IMPLANT
PENCIL SMOKE EVACUATOR (MISCELLANEOUS) ×3 IMPLANT
POWDER MYRIAD MORCELLS 1000MG (Miscellaneous) ×1 IMPLANT
SHEET MEDIUM DRAPE 40X70 STRL (DRAPES) IMPLANT
SLEEVE SCD COMPRESS KNEE MED (STOCKING) ×3 IMPLANT
SPIKE FLUID TRANSFER (MISCELLANEOUS) IMPLANT
SPLINT PLASTER CAST XFAST 3X15 (CAST SUPPLIES) IMPLANT
SPLINT PLASTER XTRA FASTSET 3X (CAST SUPPLIES)
SPONGE T-LAP 18X18 ~~LOC~~+RFID (SPONGE) ×3 IMPLANT
STAPLER VISISTAT 35W (STAPLE) IMPLANT
STOCKINETTE 4X48 STRL (DRAPES) IMPLANT
STOCKINETTE 6  STRL (DRAPES) ×2
STOCKINETTE 6 STRL (DRAPES) ×2 IMPLANT
STOCKINETTE IMPERVIOUS LG (DRAPES) IMPLANT
SUCTION FRAZIER HANDLE 10FR (MISCELLANEOUS)
SUCTION TUBE FRAZIER 10FR DISP (MISCELLANEOUS) IMPLANT
SURGILUBE 2OZ TUBE FLIPTOP (MISCELLANEOUS) IMPLANT
SUT MNCRL AB 3-0 PS2 18 (SUTURE) IMPLANT
SUT MNCRL AB 4-0 PS2 18 (SUTURE) IMPLANT
SUT MON AB 5-0 PS2 18 (SUTURE) IMPLANT
SUT SILK 3 0 PS 1 (SUTURE) IMPLANT
SUT VIC AB 3-0 FS2 27 (SUTURE) IMPLANT
SUT VIC AB 5-0 P-3 18X BRD (SUTURE) IMPLANT
SUT VIC AB 5-0 P3 18 (SUTURE)
SUT VIC AB 5-0 PS2 18 (SUTURE) ×3 IMPLANT
SWAB COLLECTION DEVICE MRSA (MISCELLANEOUS) IMPLANT
SWAB CULTURE ESWAB REG 1ML (MISCELLANEOUS) IMPLANT
SYR BULB IRRIG 60ML STRL (SYRINGE) IMPLANT
SYR CONTROL 10ML LL (SYRINGE) ×2 IMPLANT
TOWEL GREEN STERILE FF (TOWEL DISPOSABLE) ×6 IMPLANT
TRAY DSU PREP LF (CUSTOM PROCEDURE TRAY) ×3 IMPLANT
TUBE CONNECTING 20X1/4 (TUBING) ×3 IMPLANT
UNDERPAD 30X36 HEAVY ABSORB (UNDERPADS AND DIAPERS) ×3 IMPLANT
YANKAUER SUCT BULB TIP NO VENT (SUCTIONS) ×3 IMPLANT

## 2021-04-16 NOTE — Interval H&P Note (Signed)
History and Physical Interval Note: ? ?04/16/2021 ?7:10 AM ? 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  The various methods of treatment have been discussed with the patient and family. After consideration of risks, benefits and other options for treatment, the patient has consented to  Procedure(s) with comments: ?DEBRIDEMENT AND CLOSURE WOUND (Left) - 1 hour ?APPLICATION OF WOUND VAC (Left) ?APPLICATION OF MYRIAD (Left) as a surgical intervention.  The patient's history has been reviewed, patient examined, no change in status, stable for surgery.  I have reviewed the patient's chart and labs.  Questions were answered to the patient's satisfaction.   ? ? ?Nancy [name] ? ? ?

## 2021-04-16 NOTE — Anesthesia Procedure Notes (Signed)
Procedure Name: [place] ?Date/Time: 04/16/2021 8:41 AM ?Performed by: Glory Buff, CRNA ?Pre-anesthesia Checklist: Patient identified, Emergency Drugs available, Suction available and Patient being monitored ?Patient Re-evaluated:Patient Re-evaluated prior to induction ?Oxygen Delivery Method: Simple face mask ? ? ? ? ?

## 2021-04-16 NOTE — Progress Notes (Signed)
Post-op pain meds. Patient reports PCP who rx tramadol for migraines is aware. She cannot tolerate oxy/norco. She reports she is not on a pain contract. She reports she is aware of risks of mixing narcotics.  ? ?Rx for tramadol sent to patient pharmacy for 5 days. ?Call w/ questions/concerns. She is doing well post-op. ?

## 2021-04-16 NOTE — Anesthesia Procedure Notes (Signed)
Anesthesia Regional Block: Adductor canal block  ? ?Pre-Anesthetic Checklist: , timeout performed,  Correct Patient, Correct Site, Correct Laterality,  Correct Procedure, Correct Position, site marked,  Risks and benefits discussed,  Surgical consent,  Pre-op evaluation,  At surgeon's request and post-op pain management ? ?Laterality: Left ? ?Prep: Maximum Sterile Barrier Precautions used, chloraprep     ?  ?Needles:  ?Injection technique: Single-shot ? ?Needle Type: Echogenic Stimulator Needle   ? ? ?Needle Length: 9cm  ?Needle Gauge: 22  ? ? ? ?Additional Needles: ? ? ?Procedures:,,,, ultrasound used (permanent image in chart),,    ?Narrative:  ?Start time: 04/16/2021 8:10 AM ?End time: 04/16/2021 8:15 AM ?Injection made incrementally with aspirations every 5 mL. ? ?Performed by: Personally  ?Anesthesiologist: Pervis Hocking, DO ? ?Additional Notes: ?Monitors applied. No increased pain on injection. No increased resistance to injection. Injection made in 5cc increments. Good needle visualization. Patient tolerated procedure well.  ? ? ? ? ?

## 2021-04-16 NOTE — Op Note (Signed)
DATE OF OPERATION: 04/16/2021 ? ?LOCATION: [facility] ? ?PREOPERATIVE DIAGNOSIS: left heel wound ? ?POSTOPERATIVE DIAGNOSIS: Same ? ?PROCEDURE:  ?Excision of left heel wound 5 x 10 cm skin and soft tissue and 1 cm bone  ?Placement of Myriad powder 1 gm and sheet 7 x 10 cm ?Placement of VAC ? ?SURGEON: Tung Pustejovsky Sanger Darren Caldron, DO ? ?ASSISTANT: Matthew Visual merchandiser ? ?EBL: 1 cc ? ?CONDITION: Stable ? ?COMPLICATIONS: None ? ?INDICATION: The patient, Nancy [name], is a 56 y.o. female born on [birth year], is here for treatment of a chronic right heel wound.  ? ?PROCEDURE DETAILS:  ?The patient was seen prior to surgery and marked.  The IV antibiotics were given. The patient was taken to the operating room and given a general anesthetic. A standard time out was performed and all information was confirmed by those in the room. SCD was placed on the right leg.   The left leg was prepped and draped.  The heel was irrigated with saline.  The #10 blade was used to excise the skin and soft tissue of the 5 x 10 cm wound.  The bone rongeur was used to excise the exposed 1 cm area of bone. Hemostasis was achieved with electrocautery.  All of the myriad powder and sheet was applied.  The Myriad sheet was sutured in place with the 5-0 Vicryl.  The sorbact was applied and secured with the 5-0 Vicryl.  KY gel and the VAC was applied.  There was an excellent seal.  The leg was wrapped with an ace wrap. The patient was allowed to wake up and taken to recovery room in stable condition at the end of the case. The family was notified at the end of the case.  ? ?The advanced practice practitioner (APP) assisted throughout the case.  The APP was essential in retraction and counter traction when needed to make the case progress smoothly.  This retraction and assistance made it possible to see the tissue plans for the procedure.  The assistance was needed for blood control, tissue re-approximation and assisted with closure of  the incision site. ? ?

## 2021-04-16 NOTE — Progress Notes (Addendum)
Assisted Dr. Doroteo Glassman with left, ultrasound guided adductor canal, popliteal block. Side rails up, monitors on throughout procedure. See vital signs in flow sheet. Tolerated Procedure well. ?

## 2021-04-16 NOTE — Transfer of Care (Signed)
Immediate Anesthesia Transfer of Care Note ? ?Patient: Nancy [name] ? ?Procedure(s) Performed: DEBRIDEMENT AND CLOSURE WOUND (Left: Heel) ?APPLICATION OF WOUND VAC (Left: Heel) ?APPLICATION OF MYRIAD (Left: Heel) ? ?Patient Location: PACU ? ?Anesthesia Type:MAC ? ?Level of Consciousness: drowsy, patient cooperative and responds to stimulation ? ?Airway & Oxygen Therapy: Patient Spontanous Breathing and Patient connected to face mask oxygen ? ?Post-op Assessment: Report given to RN and Post -op Vital signs reviewed and stable ? ?Post vital signs: Reviewed and stable ? ?Last Vitals:  ?Vitals Value Taken Time  ?BP    ?Temp    ?Pulse    ?Resp    ?SpO2    ? ? ?Last Pain:  ?Vitals:  ? 04/16/21 0744  ?TempSrc: Oral  ?PainSc: 0-No pain  ?   ? ?Patients Stated Pain Goal: 3 (04/16/21 0744) ? ?Complications: No notable events documented. ?

## 2021-04-16 NOTE — Discharge Instructions (Addendum)
Wound Care  ? ?Guide to Wound Care  ?Proper wound care may reduce the risk of infection, improve healing rates, and limit scarring.  This is a general guide to help care for and manage wounds treated with product.  ? ?Dressing Changes ?The frequency of dressing changes can vary based on which product was applied, the size of the wound, or the amount of wound drainage. Dressing inspections are recommended, at least weekly.   ?If you have a Wound VAC it will be changed in one week after the first time it is applied.  Then it will be changed once or twice a week.   ?If you don't have a Wound VAC, then place KY gel on the wound daily and cover with gauze. ? ?Dressing Types ?Primary Dressing:  Non-adherent dressing goes directly over wounds being treated with the powder or sheet.  ?Secondary Dressing:  Secures the primary dressing in place and provides extra protection, compression, and absorption. ? ?1. Wash Hands - To help decrease the risk of infection, caregivers should wash their hands for a minimum of 20 seconds and may use medical gloves.  ? ?2. Remove the Dressings - Avoid removing product from the wound by carefully removing the applicable dressing(s) at the time points recommended above, or as recommended by the treating physician.  ?Expected Color and Odor:  It is entirely normal for the wound to have an unpleasant odor and to form a caramel-colored gel as the product absorbs into the wound. It is important to leave this gel on the wound site. ? ?3. Clean the Wound - Use clean water or saline to gently rinse around the wound surface and remove any excess discharge that may be present on the wound. Do not wipe off any of the caramel-colored gel on the wound.  ? ?What to look out for: ? Large or increased amount of drainage  ? Surrounding skin has worsening redness or hot to touch  ? Increased pain in or around the wound  ? Flu-like symptoms, fatigue, decreased appetite, fever  ? Hard, crusty wound surface with  black or brown coloring ? ?4. Apply New Dressings - Dressings should cover the entire wound and be suitable for maintaining a moist wound environment.  The non-adherent mesh dressing should be left in place.  New dressing should consist of KY Jelly to keep the wound moist and soft gauze secured with a wrap or tape.  ? ?Maintain a Hydrated Wound Area ?It is important to keep the wound area moist throughout the healing process. If the wound appears to be dry during dressing changes, select a dressing that will hydrate the wound and maintain that ideal moist environment. If you are unsure what to do, ask the treating physician. ? ?Remodeling Process ?Every patient heals differently, and no two cases are the same. The size and location of the wound, product type and layering configurations, and general patient health all contribute to how quickly a wound will heal.  While many factors can influence the rate at which the product absorbs, the following can be used as a general guide.  ? ?THINGS TO DO: ?Refrain from smoking ?High protein diet with plenty of vegetables and some fruit  ?Limit simple processed carbohydrates and sugar ?Protect the wound from trauma ?Protect the dressing ? ?powder      Sheet            Sorbact dressing ? ? ?May have Tylenol again after 1:50pm, if needed. ? ? ?Post Anesthesia  Home Care Instructions ? ?Activity: ?Get plenty of rest for the remainder of the day. A responsible individual must stay with you for 24 hours following the procedure.  ?For the next 24 hours, DO NOT: ?-Drive a car ?-Advertising copywriter ?-Drink alcoholic beverages ?-Take any medication unless instructed by your physician ?-Make any legal decisions or sign important papers. ? ?Meals: ?Start with liquid foods such as gelatin or soup. Progress to regular foods as tolerated. Avoid greasy, spicy, heavy foods. If nausea and/or vomiting occur, drink only clear liquids until the nausea and/or vomiting subsides. Call your physician if  vomiting continues. ? ?Special Instructions/Symptoms: ?Your throat may feel dry or sore from the anesthesia or the breathing tube placed in your throat during surgery. If this causes discomfort, gargle with warm salt water. The discomfort should disappear within 24 hours. ? ?If you had a scopolamine patch placed behind your ear for the management of post- operative nausea and/or vomiting: ? ?1. The medication in the patch is effective for 72 hours, after which it should be removed.  Wrap patch in a tissue and discard in the trash. Wash hands thoroughly with soap and water. ?2. You may remove the patch earlier than 72 hours if you experience unpleasant side effects which may include dry mouth, dizziness or visual disturbances. ?3. Avoid touching the patch. Wash your hands with soap and water after contact with the patch. ?   Regional Anesthesia Blocks ? ?1. Numbness or the inability to move the "blocked" extremity may last from 3-48 hours after placement. The length of time depends on the medication injected and your individual response to the medication. If the numbness is not going away after 48 hours, call your surgeon. ? ?2. The extremity that is blocked will need to be protected until the numbness is gone and the  Strength has returned. Because you cannot feel it, you will need to take extra care to avoid injury. Because it may be weak, you may have difficulty moving it or using it. You may not know what position it is in without looking at it while the block is in effect. ? ?3. For blocks in the legs and feet, returning to weight bearing and walking needs to be done carefully. You will need to wait until the numbness is entirely gone and the strength has returned. You should be able to move your leg and foot normally before you try and bear weight or walk. You will need someone to be with you when you first try to ensure you do not fall and possibly risk injury. ? ?4. Bruising and tenderness at the needle site  are common side effects and will resolve in a few days. ? ?5. Persistent numbness or new problems with movement should be communicated to the surgeon or the [facility] [phone] [facility] [phone]).  ?

## 2021-04-16 NOTE — Anesthesia Postprocedure Evaluation (Signed)
Anesthesia Post Note ? ?Patient: Nancy [name] ? ?Procedure(s) Performed: DEBRIDEMENT AND CLOSURE WOUND (Left: Heel) ?APPLICATION OF WOUND VAC (Left: Heel) ?APPLICATION OF MYRIAD (Left: Heel) ? ?  ? ?Patient location during evaluation: PACU ?Anesthesia Type: Regional and MAC ?Level of consciousness: awake and alert ?Pain management: pain level controlled ?Vital Signs Assessment: post-procedure vital signs reviewed and stable ?Respiratory status: spontaneous breathing, nonlabored ventilation and respiratory function stable ?Cardiovascular status: blood pressure returned to baseline and stable ?Postop Assessment: no apparent nausea or vomiting ?Anesthetic complications: no ? ? ?No notable events documented. ? ?Last Vitals:  ?Vitals:  ? 04/16/21 0945 04/16/21 1000  ?BP: (!) 162/80   ?Pulse: 71 71  ?Resp: 16 15  ?Temp:    ?SpO2: 100% 98%  ?  ?Last Pain:  ?Vitals:  ? 04/16/21 1000  ?TempSrc:   ?PainSc: 0-No pain  ? ? ?LLE Motor Response: Purposeful movement;Responds to commands (04/16/21 1000) ?LLE Sensation: Decreased;No pain (04/16/21 1000) ?  ?  ?  ?  ? ?[name] ? ? ? ? ?

## 2021-04-16 NOTE — Anesthesia Procedure Notes (Signed)
Anesthesia Regional Block: Popliteal block  ? ?Pre-Anesthetic Checklist: , timeout performed,  Correct Patient, Correct Site, Correct Laterality,  Correct Procedure, Correct Position, site marked,  Risks and benefits discussed,  Surgical consent,  Pre-op evaluation,  At surgeon's request and post-op pain management ? ?Laterality: Left ? ?Prep: Maximum Sterile Barrier Precautions used, chloraprep     ?  ?Needles:  ?Injection technique: Single-shot ? ?Needle Type: Echogenic Stimulator Needle   ? ? ?Needle Length: 9cm  ?Needle Gauge: 22  ? ? ? ?Additional Needles: ? ? ?Procedures:,,,, ultrasound used (permanent image in chart),,    ?Narrative:  ?Start time: 04/16/2021 8:05 AM ?End time: 04/16/2021 8:10 AM ?Injection made incrementally with aspirations every 5 mL. ? ?Performed by: Personally  ?Anesthesiologist: Lannie Fields, DO ? ?Additional Notes: ?Monitors applied. No increased pain on injection. No increased resistance to injection. Injection made in 5cc increments. Good needle visualization. Patient tolerated procedure well.  ? ? ? ? ?

## 2021-04-17 ENCOUNTER — Encounter (HOSPITAL_BASED_OUTPATIENT_CLINIC_OR_DEPARTMENT_OTHER): Payer: Self-pay | Admitting: Plastic Surgery

## 2021-04-17 ENCOUNTER — Telehealth: Payer: Self-pay | Admitting: Plastic Surgery

## 2021-04-17 NOTE — Telephone Encounter (Signed)
Lauren called from [facility] regarding clarification of visit orders for patient. Requested a callback at [phone].  ?

## 2021-04-18 NOTE — Telephone Encounter (Signed)
Called and spoke with Lauren with [facility] regarding the message below.  Informed Lauren per Dr. Rolland Porter wound vac starting next week.  Changing wound vac 2x/week.  Lauren verbalized understanding and agreed.//AB/CMA ?

## 2021-04-20 ENCOUNTER — Encounter (HOSPITAL_BASED_OUTPATIENT_CLINIC_OR_DEPARTMENT_OTHER): Payer: Self-pay | Admitting: Plastic Surgery

## 2021-04-23 ENCOUNTER — Ambulatory Visit (INDEPENDENT_AMBULATORY_CARE_PROVIDER_SITE_OTHER): Payer: 59 | Admitting: Surgical

## 2021-04-23 DIAGNOSIS — L97424 Non-pressure chronic ulcer of left heel and midfoot with necrosis of bone: Secondary | ICD-10-CM

## 2021-04-23 DIAGNOSIS — E11621 Type 2 diabetes mellitus with foot ulcer: Secondary | ICD-10-CM | POA: Diagnosis not present

## 2021-04-23 NOTE — Progress Notes (Signed)
? ?  Referring Provider ?Shirley, Amanda M, FNP ?[address] ?[place],  [place] [id number]  ? ?CC: No chief complaint on file. ?   ? ?Nancy [name] is an 56 y.o. female.  ?HPI: 56-year-old female here for follow-up after debridement of her left heel wound with Dr. Dillingham on 04/16/2021.  She had placement of myriad wound matrix powder and sheet and placement of wound VAC.  She is here today with her spouse.  She reports she is doing well, has been using Tylenol for pain control.  Wound VAC is in place, good seal was held throughout the postoperative period. ? ?She reports home health is scheduled to come in tomorrow to replace the VAC.  She reports that she previously discussed with her podiatrist that she will follow-up with them after the wound has completely healed to discuss therapy for ambulation due to loss of her Achilles tendon and to discuss her limitations moving forward. ? ?She has FMLA and disability forms that she would like me to complete.  She reports she has been provided with forms by the podiatrist to be out until end of July. ? ?Review of Systems ?General: No fevers or chills ? ?Physical Exam ? ?  04/16/2021  ? 10:30 AM 04/16/2021  ? 10:00 AM 04/16/2021  ?  9:45 AM  ?Vitals with BMI  ?Systolic 179  162  ?Diastolic 80  80  ?Pulse 71 71 71  ?  ?General:  No acute distress,  Alert and oriented, Non-Toxic, Normal speech and affect ?Left lower extremity: Left heel wound noted.  No surrounding erythema.  Wound matrix in place.  Sorbact was removed due to adherence to the VAC sponge.  There is no foul odor.  No tenderness with palpation.  DP pulse noted. ? ? ?  ? ?Assessment/Plan ?Wound VAC was removed, Adaptic, K-Y jelly, 4 4 gauze, ABD, Kerlix, Ace wrap was applied.  Recommend wound VAC change 3 times per week with assistance of home health starting tomorrow.  Recommend following up in 1 week for reevaluation.  Recommend calling with questions or concerns. ? ?We will fill out FMLA and disability forms  for patient to remain out of work until July due to her inability to travel to work.  She reports she is currently living with her mother and care facility and does not have Wi-Fi and therefore cannot work from home. ? ?Pictures were obtained of the patient and placed in the chart with the patient's or guardian's permission. ? ? ? J  ?04/23/2021, 10:25 AM  ? ? ?    ?

## 2021-04-23 NOTE — Addendum Note (Signed)
Addended by: Drema Dallas K on: 04/23/2021 01:42 PM ? ? Modules accepted: Orders ? ?

## 2021-04-25 ENCOUNTER — Encounter: Payer: 59 | Admitting: Physician Assistant

## 2021-05-09 ENCOUNTER — Ambulatory Visit (INDEPENDENT_AMBULATORY_CARE_PROVIDER_SITE_OTHER): Payer: 59 | Admitting: Surgical

## 2021-05-09 DIAGNOSIS — E11621 Type 2 diabetes mellitus with foot ulcer: Secondary | ICD-10-CM

## 2021-05-09 DIAGNOSIS — L97424 Non-pressure chronic ulcer of left heel and midfoot with necrosis of bone: Secondary | ICD-10-CM

## 2021-05-09 NOTE — Progress Notes (Signed)
Subjective:  ? ?  Patient ID: JAHNAE MCADOO, female    DOB: [birth year], 56 y.o.   MRN: [id number] ? ?Chief Complaint  ?Patient presents with  ? Follow-up  ? ? ?HPI: The patient is a 56 y.o. female here for follow-up after debridement of her left heel wound with Dr. Marla Roe on 04/16/2021.  She had placement of myriad wound matrix and wound VAC at this time.  She is here today with her spouse. ? ?She reports she is overall doing well, home health RN has been changing wound [facility] Monday Wednesday Friday.  They did notice an area of separation within the granulation tissue along the heel.  She is not having any infectious symptoms.  No increased pain. ? ?They have some questions about additional surgery. ? ?She reports most recent A1c was 12, however blood sugars have been under much better control and she is scheduled for a recheck soon. ? ?Review of Systems  ?Constitutional:  Positive for activity change.  ?Skin:  Positive for wound.  ? ? ?Objective:  ? ?Vital Signs ?LMP 01/15/2015 (Within Weeks)  ?Vital Signs and Nursing Note Reviewed ?Chaperone present ?Physical Exam ?Constitutional:   ?   General: She is not in acute distress. ?   Appearance: Normal appearance. She is not ill-appearing.  ?HENT:  ?   Head: Normocephalic and atraumatic.  ?Musculoskeletal:     ?   General: Deformity present. No swelling.  ?   Right lower leg: No edema.  ?   Left lower leg: No edema.  ?Skin: ?   General: Skin is warm and dry.  ?   Findings: No erythema.  ?Neurological:  ?   Mental Status: She is alert.  ?Psychiatric:     ?   Mood and Affect: Mood normal.     ?   Behavior: Behavior normal.  ? ? ? ?Left heel: Left heel wound with granulation tissue noted superiorly and new epithelialization noted.  She does have a split within the granulation tissue on the inferior most portion of the wound.  There is no foul odors.  She has some maceration of the epithelium surrounding the interim most part of the  wound. ? ? ? ?Assessment/Plan:  ? ?  ICD-10-CM   ?1. 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 D32.202   ?  ? ? ?Left heel wound improving, wound matrix still incorporating.  I did debride some of the wound matrix today using pickups and scissors as there were multiple layers that were not incorporating.  Patient tolerated this well.  Sterile technique was used.  Recommend VAC holiday until Friday.  Adaptic, K-Y jelly, 4 x 4 gauze dressings were applied followed by Kerlix, Ace wrap. ? ?Recommend calling with questions or concerns. Follow up scheduled for 2 weeks ? ?Pictures were obtained of the patient and placed in the chart with the patient's or guardian's permission. ? ? ?Carola Rhine Scheeler, PA-C ?05/09/2021, 12:33 PM ? ? ? ? ? ?  Subjective:  ? ?  Patient ID: Lavine S Zwahlen, female    DOB: 12/25/1965, 55 y.o.   MRN: [id number] ? ?Chief Complaint  ?Patient presents with  ? Follow-up  ? ? ?HPI: The patient is a 55 y.o. female here for follow-up after debridement of her left heel wound with Dr. Dillingham on 04/16/2021.  She had placement of myriad wound matrix and wound VAC at this time.  She is here today with her spouse. ? ?She reports she is overall doing well, home health RN has been changing wound VAC Monday Wednesday Friday.  They did notice an area of separation within the granulation tissue along the heel.  She is not having any infectious symptoms.  No increased pain. ? ?They have some questions about additional surgery. ? ?She reports most recent A1c was 12, however blood sugars have been under much better control and she is scheduled for a recheck soon. ? ?Review of Systems  ?Constitutional:  Positive for activity change.  ?Skin:  Positive for wound.  ? ? ?Objective:  ? ?Vital Signs ?LMP 01/15/2015 (Within Weeks)  ?Vital Signs and Nursing Note Reviewed ?Chaperone present ?Physical Exam ?Constitutional:   ?   General: She is not in acute distress. ?   Appearance: Normal appearance. She is not ill-appearing.  ?HENT:  ?   Head: Normocephalic and atraumatic.  ?Musculoskeletal:     ?   General: Deformity present. No swelling.  ?   Right lower leg: No edema.  ?   Left lower leg: No edema.  ?Skin: ?   General: Skin is warm and dry.  ?   Findings: No erythema.  ?Neurological:  ?   Mental Status: She is alert.  ?Psychiatric:     ?   Mood and Affect: Mood normal.     ?   Behavior: Behavior normal.  ? ? ? ?Left heel: Left heel wound with granulation tissue noted superiorly and new epithelialization noted.  She does have a split within the granulation tissue on the inferior most portion of the wound.  There is no foul odors.  She has some maceration of the epithelium surrounding the interim most part of the  wound. ? ? ? ?Assessment/Plan:  ? ?  ICD-10-CM   ?1. 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 L97.424   ?  ? ? ?Left heel wound improving, wound matrix still incorporating.  I did debride some of the wound matrix today using pickups and scissors as there were multiple layers that were not incorporating.  Patient tolerated this well.  Sterile technique was used.  Recommend VAC holiday until Friday.  Adaptic, K-Y jelly, 4 x 4 gauze dressings were applied followed by Kerlix, Ace wrap. ? ?Recommend calling with questions or concerns. Follow up scheduled for 2 weeks ? ?Pictures were obtained of the patient and placed in the chart with the patient's or guardian's permission. ? ? ?Matthew J Scheeler, PA-C ?05/09/2021, 12:33 PM ? ? ? ? ? ?

## 2021-05-17 ENCOUNTER — Other Ambulatory Visit: Payer: Self-pay

## 2021-05-17 MED ORDER — AMOXICILLIN-POT CLAVULANATE 875-125 MG PO TABS: 1.0000 | ORAL_TABLET | Freq: Two times a day (BID) | ORAL | 0 refills | Status: DC

## 2021-05-28 NOTE — Progress Notes (Unsigned)
 Subjective: Patient Name: Nancy [name], Female  DOB: 02/11/1965, 55 y.o.  MRN: [id number]   Chief Complaint  Patient presents with   Follow-up    HPI: Patient is a 55-year-old female with a history of diabetic ulcer of the left heel associated with type 2 diabetes with necrosis of the bone.  Patient underwent excision of left heel wound 5 x 10 cm skin and soft tissue and 1 cm of bone as well as placement of myriad of powder and sheet and placement of wound VAC with Dr. Dillingham on 04/16/2021.  Patient was last seen on 05/09/2021 in the clinic.  At this visit, she was doing well and her wound VAC was being changed 3 times a week.  The left heel wound was found to be improving but the wound matrix is still incorporating.  Patient was recommended to continue Adaptic, K-Y jelly, 4 x 4 gauze dressing followed by Kerlix and Ace wrap. Patient was also to continue resume wound vac a few days after the appointment.   Patient here for a postoperative follow up. Today, patient reports that she has been doing well.  She states that home health care has been doing the wound VAC changes 3 times a week.  She denies any fevers or chills.  Patient does not have any new concerns. She does state that the home health nurse wanted her to ask about a small split in the granulation tissue near the heel in the wound.   Review of Systems  Constitutional:  Negative for chills and fever.  Skin:        Positive for wound    Objective: Physical Exam Constitutional:      General: She is not in acute distress.    Appearance: She is not ill-appearing.  HENT:     Head: Normocephalic.  Pulmonary:     Effort: Pulmonary effort is normal.  Musculoskeletal:        General: Deformity (Left heel) present.     Right lower leg: No edema.     Left lower leg: No edema.     Comments: Left heel: Dressings and wound VAC to left foot were removed.  The wound to her left heel is approximately 6 cm in width and 9 cm in  length.  There appears to be granulation tissue formation within the wound.  There is a split at the inferior aspect of the wound.  There is no malodor.  There are no signs of infection.  Neurological:     Mental Status: She is alert.  Psychiatric:        Mood and Affect: Mood normal.        Behavior: Behavior normal.    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  Patient to resume wound VAC changes with home health tomorrow. Patient acknowledged.   Discussed with patient that the home health nurse should use Vashe wound solution on the wound, then Adaptic, [place] jelly and then the wound vac 3x / week. Patient acknowledged. Vashe wound solution sample was given to patient. Explained that if patient runs out of vashe solution she may find some online. She acknowledged.   Discussed objective findings and plan with Dr. Dillingham.  Told patient to call if she has any questions or concerns.   Patient   Subjective: Patient Name: Nancy [name], Female  DOB: 1965/09/20, 56 y.o.  MRN: [id number]   Chief Complaint  Patient presents with   Follow-up    HPI: Patient is a 56 year old female with a history of diabetic ulcer of the left heel associated with type 2 diabetes with necrosis of the bone.  Patient underwent excision of left heel wound 5 x 10 cm skin and soft tissue and 1 cm of bone as well as placement of myriad of powder and sheet and placement of wound VAC with Dr. Marla Roe on 04/16/2021.  Patient was last seen on 05/09/2021 in the clinic.  At this visit, she was doing well and her wound VAC was being changed 3 times a week.  The left heel wound was found to be improving but the wound matrix is still incorporating.  Patient was recommended to continue Adaptic, K-Y jelly, 4 x 4 gauze dressing followed by Kerlix and Ace wrap. Patient was also to continue resume wound vac a few days after the appointment.   Patient here for a postoperative follow up. Today, patient reports that she has been doing well.  She states that home health care has been doing the wound VAC changes 3 times a week.  She denies any fevers or chills.  Patient does not have any new concerns. She does state that the home health nurse wanted her to ask about a small split in the granulation tissue near the heel in the wound.   Review of Systems  Constitutional:  Negative for chills and fever.  Skin:        Positive for wound    Objective: Physical Exam Constitutional:      General: She is not in acute distress.    Appearance: She is not ill-appearing.  HENT:     Head: Normocephalic.  Pulmonary:     Effort: Pulmonary effort is normal.  Musculoskeletal:        General: Deformity (Left heel) present.     Right lower leg: No edema.     Left lower leg: No edema.     Comments: Left heel: Dressings and wound VAC to left foot were removed.  The wound to her left heel is approximately 6 cm in width and 9 cm in  length.  There appears to be granulation tissue formation within the wound.  There is a split at the inferior aspect of the wound.  There is no malodor.  There are no signs of infection.  Neurological:     Mental Status: She is alert.  Psychiatric:        Mood and Affect: Mood normal.        Behavior: Behavior normal.    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  Patient to resume wound VAC changes with home health tomorrow. Patient acknowledged.   Discussed with patient that the home health nurse should use Vashe wound solution on the wound, then Adaptic, [place] jelly and then the wound vac 3x / week. Patient acknowledged. Vashe wound solution sample was given to patient. Explained that if patient runs out of vashe solution she may find some online. She acknowledged.   Discussed objective findings and plan with Dr. Marla Roe.  Told patient to call if she has any questions or concerns.   Patient

## 2021-05-29 ENCOUNTER — Encounter: Payer: Self-pay | Admitting: Student

## 2021-05-29 ENCOUNTER — Ambulatory Visit (INDEPENDENT_AMBULATORY_CARE_PROVIDER_SITE_OTHER): Payer: 59 | Admitting: Student

## 2021-05-29 DIAGNOSIS — L97424 Non-pressure chronic ulcer of left heel and midfoot with necrosis of bone: Secondary | ICD-10-CM | POA: Diagnosis not present

## 2021-05-29 DIAGNOSIS — E11621 Type 2 diabetes mellitus with foot ulcer: Secondary | ICD-10-CM

## 2021-05-30 ENCOUNTER — Telehealth: Payer: Self-pay | Admitting: *Deleted

## 2021-05-30 ENCOUNTER — Encounter: Payer: Self-pay | Admitting: Student

## 2021-05-30 ENCOUNTER — Telehealth: Payer: Self-pay | Admitting: Student

## 2021-05-30 NOTE — Telephone Encounter (Signed)
Spoke with pt re: FMLA/disability paperwork. States she would like the work note faxed with the paperwork when completed

## 2021-05-30 NOTE — Telephone Encounter (Signed)
PTN NEEDS WORK NOTE - PRINTED FOR CLAIRE TO SIGN

## 2021-05-31 ENCOUNTER — Other Ambulatory Visit: Payer: Self-pay | Admitting: Infectious Diseases

## 2021-05-31 ENCOUNTER — Ambulatory Visit: Payer: 59 | Attending: Infectious Diseases | Admitting: Infectious Diseases

## 2021-05-31 ENCOUNTER — Encounter: Payer: Self-pay | Admitting: Infectious Diseases

## 2021-05-31 VITALS — BP 166/90 | HR 76 | Temp 97.9°F

## 2021-05-31 DIAGNOSIS — Z7984 Long term (current) use of oral hypoglycemic drugs: Secondary | ICD-10-CM

## 2021-05-31 DIAGNOSIS — E119 Type 2 diabetes mellitus without complications: Secondary | ICD-10-CM | POA: Diagnosis not present

## 2021-05-31 DIAGNOSIS — Z794 Long term (current) use of insulin: Secondary | ICD-10-CM | POA: Insufficient documentation

## 2021-05-31 DIAGNOSIS — I10 Essential (primary) hypertension: Secondary | ICD-10-CM | POA: Insufficient documentation

## 2021-05-31 DIAGNOSIS — E11628 Type 2 diabetes mellitus with other skin complications: Secondary | ICD-10-CM

## 2021-05-31 DIAGNOSIS — E785 Hyperlipidemia, unspecified: Secondary | ICD-10-CM | POA: Diagnosis not present

## 2021-05-31 DIAGNOSIS — L089 Local infection of the skin and subcutaneous tissue, unspecified: Secondary | ICD-10-CM | POA: Diagnosis not present

## 2021-05-31 NOTE — Progress Notes (Signed)
NAME: Nancy [name]  DOB: 23-Mar-1965  MRN: [id number]  Date/Time: 05/31/2021 10:37 AM   Subjective:   ?follow up visit For left heel wound  Nancy [name] is a 56 y.o. with a history of DM, DFI with osteo/necrosis  and underwent debridement and partial excision of calcaneum by Dr.Fowler- culture positive for enterococcus and finegoldia- she was sent to rehab on Iv unasyn to complete 6 weeks on 3/20./23. she was placed on augmentin following that She saw Dr.Dillingham plastic surgeon and underwent  Excision of left heel wound 5 x 10 cm skin and soft tissue and 1 cm bone  Placement of Myriad powder 1 gm and sheet 7 x 10 cm Placement of VAC  She follows up with them and during her last visit on 05/29/21 the wound was looking good except for the lower edge, where there is stil a slit like opening. Recommendation given by plastic surgery team was Vashe wound solution on the wound, then Adaptic, [place] jelly and then the wound vac 3x / week Pt still on augmentin No fever or chills Has not had weight bearing to the foot    Past Medical History:  Diagnosis Date   Anxiety    Arthritis    r knee   Asthma    CAD (coronary artery disease)    a.) LHC 06/04/2009 at [facility]; non-obstructive CAD. b.) CTA with FFR 10/08/2018: extensive mixed plaque proximal to mid LAD (51-69%); Coronary Ca score 217; FFR 0.71 dPDA, 0.86 mLAD, 0.87 dLCx.   CCF (congestive cardiac failure) ([place]) 06/06/2008   a.) 30% EF. b.) TTE 06/03/2011: EF >55%; triv MR. c.) TTE 04/27/2018: EF 55%; mild LVH; triv PR, mild MR/TR; G1DD.   Complication of anesthesia    Eczema    Family history of adverse reaction to anesthesia    a.) PONV in mother and grandmother   GERD (gastroesophageal reflux disease) 05/10/2014   History of kidney stones    HLD (hyperlipidemia)    Migraines    Motion sickness    Panic attacks    Pneumonia    PONV (postoperative nausea and vomiting)    T2DM (type 2 diabetes mellitus) ([place])     Takotsubo cardiomyopathy / transient apical balooning syndrome / stress-induced cardiomyopathy 05/10/2014   Unspecified essential hypertension     Past Surgical History:  Procedure Laterality Date   AMPUTATION TOE Left 03/16/2015   Procedure: left fifth toe amputation with incision and drainage;  Surgeon: Samara Deist, DPM;  Location: [facility];  Service: Podiatry;  Laterality: Left;   APPLICATION OF WOUND VAC Left 02/14/2021   Procedure: APPLICATION OF WOUND VAC;  Surgeon: Samara Deist, DPM;  Location: [facility];  Service: Podiatry;  Laterality: Left;   APPLICATION OF WOUND VAC Left 04/16/2021   Procedure: APPLICATION OF WOUND VAC;  Surgeon: Wallace Going, DO;  Location: [place];  Service: Plastics;  Laterality: Left;   CARDIAC CATHETERIZATION Left 06/04/2009   Procedure: CARDIAC CATHETERIZATION; Location: [place] AND CLOSURE WOUND Left 04/16/2021   Procedure: DEBRIDEMENT AND CLOSURE WOUND;  Surgeon: Wallace Going, DO;  Location: [place];  Service: Plastics;  Laterality: Left;  1 hour   IRRIGATION AND DEBRIDEMENT FOOT Left 02/08/2021   Procedure: IRRIGATION AND DEBRIDEMENT FOOT - LFT HEEL ULCER;  Surgeon: Samara Deist, DPM;  Location: [facility];  Service: Podiatry;  Laterality: Left;   IRRIGATION AND DEBRIDEMENT FOOT Left 02/14/2021   Procedure: IRRIGATION  AND DEBRIDEMENT LEFT HEEL;  Surgeon: Samara Deist, DPM;  Location: [facility];  Service: Podiatry;  Laterality: Left;   kidney stone removal     KNEE ARTHROSCOPY W/ MENISCAL REPAIR     NOSE SURGERY  01/07/1989   due to fracture   TOE AMPUTATION     second toe   TONSILLECTOMY     TOTAL KNEE ARTHROPLASTY  01/11/2011   Procedure: TOTAL KNEE ARTHROPLASTY;  Surgeon: Alta Corning;  Location: [place];  Service: Orthopedics;  Laterality: Right;  COMPUTER ASSISTED TOTAL KNEE REPLACEMENT    Social History   Socioeconomic History   Marital status:  Married    Spouse name: Not on file   Number of children: Not on file   Years of education: Not on file   Highest education level: Not on file  Occupational History   Not on file  Tobacco Use   Smoking status: Former    Packs/day: 0.00    Years: 30.00    Pack years: 0.00    Types: Cigarettes    Quit date: 03/13/2015    Years since quitting: 6.2   Smokeless tobacco: Never  Vaping Use   Vaping Use: Never used  Substance and Sexual Activity   Alcohol use: No   Drug use: No   Sexual activity: Yes    Birth control/protection: Post-menopausal  Other Topics Concern   Not on file  Social History Narrative   Full time. Does not regularly exercise. Domestic partner.    Social Determinants of Radio broadcast assistant Strain: Not on file  Food Insecurity: Not on file  Transportation Needs: Not on file  Physical Activity: Not on file  Stress: Not on file  Social Connections: Not on file  Intimate Partner Violence: Not on file    Family History  Problem Relation Age of Onset   Transient ischemic attack Mother    Asthma Mother    Hypertension Mother    Lung cancer Father    Hypertension Father    Asthma Sister    Anxiety disorder Sister    Stomach cancer Maternal Grandmother    Colon cancer Maternal Grandmother    Leukemia Maternal Grandmother    Melanoma Maternal Grandfather    Asthma Sister    Hypertension Brother    Cancer Other    Crohn's disease Other    GER disease Other    Breast cancer Neg Hx    Allergies  Allergen Reactions   Shellfish Allergy Anaphylaxis   Codeine Other (See Comments)    Migraine   Morphine And Related Other (See Comments)    Reaction:  Severe migraines    Percocet [Oxycodone-Acetaminophen] Nausea And Vomiting   Rosuvastatin     Other reaction(s): Muscle Pain   Penicillins Rash and Other (See Comments)    Is tolerating amoxicillin so no allergies   Sulfa Antibiotics Rash   Theophyllines Itching and Rash   I? Current Outpatient  Medications  Medication Sig Dispense Refill   acidophilus (RISAQUAD) CAPS capsule Take 1 capsule by mouth daily.     albuterol (PROVENTIL HFA;VENTOLIN HFA) 108 (90 Base) MCG/ACT inhaler Inhale 2 puffs into the lungs every 4 (four) hours as needed for wheezing or shortness of breath. Reported on 03/29/2015 1 Inhaler 3   amoxicillin-clavulanate (AUGMENTIN) 875-125 MG tablet Take 1 tablet by mouth 2 (two) times daily. 60 tablet 0   ascorbic acid (VITAMIN C) 500 MG tablet Take 1 tablet (500 mg total) by mouth 2 (two) times daily.  60 tablet 0   aspirin EC 81 MG tablet Take 81 mg by mouth daily.     aspirin-acetaminophen-caffeine (EXCEDRIN MIGRAINE) 250-250-65 MG tablet Take 2 tablets by mouth every 6 (six) hours as needed for migraine.     carvedilol (COREG) 3.125 MG tablet Take 3.125 mg by mouth 2 (two) times daily with a meal.       Continuous Blood Gluc Sensor (FREESTYLE LIBRE 2 SENSOR) MISC APPLY ONE SENSOR EVERY 2 WEEKS.     desvenlafaxine (PRISTIQ) 50 MG 24 hr tablet Take 50 mg by mouth daily.     fenofibrate 54 MG tablet Take 54 mg by mouth daily.     ibuprofen (ADVIL,MOTRIN) 800 MG tablet Take 800 mg by mouth every 8 (eight) hours as needed (for migraines).      insulin NPH-regular Human (70-30) 100 UNIT/ML injection Inject 70 Units into the skin.     lisinopril (ZESTRIL) 5 MG tablet Take 5 mg by mouth 2 (two) times daily.     metFORMIN (GLUCOPHAGE) 500 MG tablet Take 3 tablets (1,500 mg total) by mouth 2 (two) times daily with a meal. Pt takes two tablets with breakfast and three tablets with dinner. (Patient taking differently: Take 1,000-1,500 mg by mouth See admin instructions. Take 1500 mg by mouth in the morning and 1000 mg in the evening) 150 tablet 1   Miconazole Nitrate 2 % POWD 200 mg by Does not apply route 2 (two) times daily as needed. 100 g 1   montelukast (SINGULAIR) 10 MG tablet Take 10 mg by mouth at bedtime.     MOUNJARO 5 MG/0.5ML Pen Inject 5 mg into the skin every Sunday.      nystatin (MYCOSTATIN/NYSTOP) powder Apply 1 application topically daily as needed (irritation).     ondansetron (ZOFRAN) 4 MG tablet Take 4 mg by mouth every 8 (eight) hours as needed for vomiting or nausea.     ondansetron (ZOFRAN-ODT) 4 MG disintegrating tablet Take 4 mg by mouth every 8 (eight) hours as needed.     pantoprazole (PROTONIX) 40 MG tablet Take 40 mg by mouth daily.     pravastatin (PRAVACHOL) 40 MG tablet Take 1 tablet (40 mg total) by mouth at bedtime. 30 tablet 5   traMADol (ULTRAM) 50 MG tablet Take 50 mg by mouth every 6 (six) hours as needed (for migraines).     zinc sulfate 220 (50 Zn) MG capsule Take 1 capsule (220 mg total) by mouth daily. 30 capsule 0   No current facility-administered medications for this visit.     Abtx:  Anti-infectives (From admission, onward)    None       REVIEW OF SYSTEMS:  Const: negative fever, negative chills, history of weight loss.  Intentional. Eyes: negative diplopia or visual changes, negative eye pain ENT: negative coryza, negative sore throat Resp: negative cough, hemoptysis, dyspnea Cards: negative for chest pain, palpitations, lower extremity edema GU: negative for frequency, dysuria and hematuria GI: Negative for abdominal pain, diarrhea, bleeding, constipation Skin: negative for rash and pruritus Heme: negative for easy bruising and gum/nose bleeding MS: negative for myalgias, arthralgias, back pain .  Has weakness left leg Neurolo:negative for headaches, dizziness, vertigo, memory problems  Psych: negative for feelings of anxiety, depression  Endocrine has diabetes and states is well controlled. Allergy/Immunology-penicillin allergy was documented.  But she tolerated ampicillin/sulbactam very well. Objective:  VITALS:  BP (!) 166/90   Pulse 76   Temp 97.9 F (36.6 C) (Temporal)   LMP 01/15/2015 (  Within Weeks)   PHYSICAL EXAM:  General: Alert, cooperative, no distress, appears stated age.  Head:  Normocephalic, without obvious abnormality, atraumatic. Eyes: Conjunctivae clear, anicteric sclerae. Pupils are equal ENT Nares normal. No drainage or sinus tenderness. Lips, mucosa, and tongue normal. No Thrush Neck: Supple, symmetrical, no adenopathy, thyroid: non tender no carotid bruit and no JVD. Back: No CVA tenderness. Lungs: Clear to auscultation bilaterally. No Wheezing or Rhonchi. No rales. Heart: Regular rate and rhythm, no murmur, rub or gallop. Abdomen: Soft, non-tender,not distended. Bowel sounds normal. No masses Extremities: left leg has wound vac Pictures reviewed 02/16/21   03/30/21   03/28/21   05/30/21    Skin: No rashes or lesions. Or bruising Lymph: Cervical, supraclavicular normal. Neurologic: Grossly non-focal  ? Impression/Recommendation ? 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  Will discuss with plastic surgeon    DM- On metformin, insulin, mounjaro ? _HTN__on lisinopril  HLD- on pravachol and fenofibrate  ________________________________________________ Discussed with patient and her wife Follow up PRN Note:  This document was prepared using Dragon voice recognition software and may include unintentional dictation errors.

## 2021-05-31 NOTE — Patient Instructions (Signed)
You are here for follow up of the left foot wound- no infection noted now- can stop the the augmentin and observe closely- if any changes please let me know. Follow prn

## 2021-06-01 ENCOUNTER — Telehealth: Payer: Self-pay | Admitting: *Deleted

## 2021-06-01 NOTE — Telephone Encounter (Signed)
[place] orders signed and faxed to [phone] with confirmation received.

## 2021-06-05 ENCOUNTER — Telehealth: Payer: Self-pay | Admitting: *Deleted

## 2021-06-05 NOTE — Telephone Encounter (Signed)
Spoke with Napoleon Form today to confirm fax number. Work note faxed to [phone] Attn: Cindra Presume per pt request. Fax confirmation received.

## 2021-06-15 ENCOUNTER — Ambulatory Visit: Payer: 59 | Admitting: Student

## 2021-06-15 ENCOUNTER — Ambulatory Visit (INDEPENDENT_AMBULATORY_CARE_PROVIDER_SITE_OTHER): Payer: 59 | Admitting: Student

## 2021-06-15 DIAGNOSIS — E11621 Type 2 diabetes mellitus with foot ulcer: Secondary | ICD-10-CM | POA: Diagnosis not present

## 2021-06-15 DIAGNOSIS — L97424 Non-pressure chronic ulcer of left heel and midfoot with necrosis of bone: Secondary | ICD-10-CM | POA: Diagnosis not present

## 2021-06-15 NOTE — Progress Notes (Signed)
Subjective: Patient ID: Nancy [name], female  DOB: 03-Jun-1965  MRN: [id number]   Chief Complaint:  Chief Complaint  Patient presents with   Follow-up     HPI:  Patient is a 56 year old female with history of chronic left heel wound.  Patient underwent excision of 5 x 10 cm left heel wound skin and soft tissue and 1 cm of bone as well as placement of myriad powder and sheet with wound VAC placed over the wound with Dr. Marla Roe on 04/16/2021.  Patient was last seen in the clinic on 05/29/2021.  At this visit she was doing well.  Patient reported that she was getting regular wound VAC changes.  She was concerned about a split aspect of her wound.  Granulation tissue formation within the wound was found on exam.  There was a small split at the inferior aspect of the wound.  There were no signs of infection.  Sent patient home to use Vashe wound solution, Adaptic, K-Y jelly and then wound VAC changes 3 times a week.  Patient is here today for evaluation of her wound.  Patient reports she has been doing well.  She states that her home health nurse has been doing her VAC changes regularly.  She states that the area that was split in her wound at her previous visit is now healing.  She has no new concerns today.  Review of Systems  Constitutional:  Negative for chills and fever.  Skin:        + for wound to left heel     Objective:  Physical Exam Constitutional:      General: She is not in acute distress. HENT:     Head: Normocephalic and atraumatic.  Pulmonary:     Effort: Pulmonary effort is normal. No respiratory distress.  Musculoskeletal:        General: Deformity present.     Comments: Left heel wound: Wound is approximately 7 cm at its longest and 5 cm at its widest.  There is healthy granulation tissue forming within the wound.  There is a small split area at the inferior and medial aspect of the wound.  There is a slight malodor present but there is no drainage, no  purulent drainage or surrounding erythema.  Neurological:     Mental Status: She is alert and oriented to person, place, and time.  Psychiatric:        Mood and Affect: Mood normal.        Behavior: Behavior normal.      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  Moistened dermal matrix was placed upon the wound bed.  Adaptic, K-Y jelly and then the wound VAC was then placed over the dermal matrix.  Wound VAC was turned on and had an adequate seal.  Foot was then wrapped with Kerlix and Ace wrap.  Discussed with patient that the wound VAC will stay on for a week without any changes.  Patient may have home health nurse change the wound VAC next Friday.  Patient then may resume normal wound VAC changes 3 times per week.  Discussed with patient to call us with any questions or concerns.  Patient to follow-up in 2 weeks.  I discussed the objective findings and plan with Dr. Marla Roe.  Pictures were obtained and placed in the patient's chart with the patient's permission.   Verdie Shire  [place], PA-C 06/15/2021

## 2021-06-18 ENCOUNTER — Telehealth: Payer: Self-pay | Admitting: *Deleted

## 2021-06-18 NOTE — Telephone Encounter (Signed)
Office notes from visit on 06/15/2021 faxed to [place]. Per pt request. ROI signed by pt and sent to batch scanning

## 2021-06-27 ENCOUNTER — Ambulatory Visit: Payer: 59 | Admitting: Surgical

## 2021-07-05 ENCOUNTER — Ambulatory Visit (INDEPENDENT_AMBULATORY_CARE_PROVIDER_SITE_OTHER): Payer: 59 | Admitting: Surgical

## 2021-07-05 ENCOUNTER — Encounter: Payer: Self-pay | Admitting: Surgical

## 2021-07-05 DIAGNOSIS — E11621 Type 2 diabetes mellitus with foot ulcer: Secondary | ICD-10-CM | POA: Diagnosis not present

## 2021-07-05 DIAGNOSIS — L97424 Non-pressure chronic ulcer of left heel and midfoot with necrosis of bone: Secondary | ICD-10-CM

## 2021-07-05 NOTE — Progress Notes (Signed)
   Referring Provider Miki Kins, FNP [address] [place],  [place] [address]   CC:  Chief Complaint  Patient presents with   Follow-up      Nancy [name] is an 56 y.o. female.  HPI: Patient is a 56 year old female here for follow-up on her chronic left heel wound.  She did undergo excision with placement of wound matrix and wound VAC with Dr. Ulice Bold on 04/16/2021.  She was last seen here in our clinic on 06/15/2021, she was doing well.  The area had a good base of granulation tissue with what appears to be some fibrinous exudate versus residual wound matrix per EMR review of photos. She reports that the area became infected after her last appointment, per EMR review podiatry recommended dressing changes with Dakin solutions and discontinuing the wound VAC for short period of time.   She presents today with a wound VAC in place.  Home health has been helping her with these dressing changes.  She is not having any infectious symptoms.  She is here with her spouse. She has no new concerns this morning.  Review of Systems General: No fevers or chills  Physical Exam    05/31/2021   10:20 AM 04/16/2021   10:30 AM 04/16/2021   10:00 AM  Vitals with BMI  Systolic 166 179   Diastolic 90 80   Pulse 76 71 71    General:  No acute distress,  Alert and oriented, Non-Toxic, Normal speech and affect Left heel: New epithelialization noted at the wound edges, specifically the superior portion.  The wound bed is 85% granulation tissue. There is no foul odor noted.  No surrounding erythema.  No active drainage.  No purulence.  The wound is approximately 6 x 5 cm.  There is some maceration along the inferior portion.      Assessment/Plan 56 year old female presents today for evaluation of left heel wound.  She is overall doing really well and has made some good progress in regards to wound healing.  She is also being seen by podiatry.  Per patient, podiatry would like to continue  with wound VAC.  Discussed with patient that I do not feel as if it is necessary for her to continue to see our office for wound care and that if she feels comfortable continuing to follow with podiatry we would be happy to see her back on an as-needed basis for any further surgical intervention.  Patient is in agreement with this and would like to continue seeing podiatry.  They are very appreciative of our care.  Recommend continuing with wound care per podiatry.  Picture taken and placed in the patient's chart with patient's permission.  I do not see any signs of infection on exam.  Recommend calling with questions or concerns.  We would be happy to see the patient for follow-up if podiatry would like [place] to continue to follow.    Kermit Balo Natnael Biederman 07/05/2021, 9:26 AM

## 2021-07-09 ENCOUNTER — Telehealth: Payer: Self-pay | Admitting: *Deleted

## 2021-07-09 NOTE — Telephone Encounter (Signed)
Received on (04/25/21) via of fax Physician Orders from [facility].  Requesting signature and return.  Given to provider to sign.     Physician Orders signed and faxed back to [facility].  Confirmation received and copy scanned into the chart.//AB/CMA

## 2021-09-28 ENCOUNTER — Ambulatory Visit (INDEPENDENT_AMBULATORY_CARE_PROVIDER_SITE_OTHER): Payer: 59 | Admitting: Vascular Surgery

## 2021-09-28 ENCOUNTER — Ambulatory Visit (INDEPENDENT_AMBULATORY_CARE_PROVIDER_SITE_OTHER): Payer: 59

## 2021-09-28 ENCOUNTER — Encounter (INDEPENDENT_AMBULATORY_CARE_PROVIDER_SITE_OTHER): Payer: Self-pay | Admitting: Vascular Surgery

## 2021-09-28 VITALS — BP 161/85 | HR 76 | Resp 16

## 2021-09-28 DIAGNOSIS — E1100 Type 2 diabetes mellitus with hyperosmolarity without nonketotic hyperglycemic-hyperosmolar coma (NKHHC): Secondary | ICD-10-CM

## 2021-09-28 DIAGNOSIS — E782 Mixed hyperlipidemia: Secondary | ICD-10-CM | POA: Diagnosis not present

## 2021-09-28 DIAGNOSIS — I10 Essential (primary) hypertension: Secondary | ICD-10-CM

## 2021-09-28 DIAGNOSIS — I7025 Atherosclerosis of native arteries of other extremities with ulceration: Secondary | ICD-10-CM | POA: Diagnosis not present

## 2021-09-28 NOTE — Progress Notes (Signed)
MRN : [id number]  Nancy [name] is a 56 y.o. ([birth year]) female who presents with chief complaint of  Chief Complaint  Patient presents with   Follow-up    Ref [facility] consult non pressure chronic ulcer,left heel and midfoot  .  History of Present Illness: Patient returns today in follow up of her left foot ulceration to recheck her perfusion.  She developed significant infection and had to have a partial calcanectomy with what sounds like an Achilles flap and some other major reconstructions several months ago.  She has just been cleared to start walking on this by her podiatrist who has done an amazing job at saving her foot.  She has longstanding diabetes multiple medical issues and had her perfusion checked about 8 months ago.  We did noninvasive studies today as well. ABIs today are likely noncompressible due to medial calcification but the waveforms are triphasic on the right and biphasic on the left with digit pressures of 147 on the right and 119 on the left.  Current Outpatient Medications  Medication Sig Dispense Refill   acidophilus (RISAQUAD) CAPS capsule Take 1 capsule by mouth daily.     albuterol (PROVENTIL HFA;VENTOLIN HFA) 108 (90 Base) MCG/ACT inhaler Inhale 2 puffs into the lungs every 4 (four) hours as needed for wheezing or shortness of breath. Reported on 03/29/2015 1 Inhaler 3   ascorbic acid (VITAMIN C) 500 MG tablet Take 1 tablet (500 mg total) by mouth 2 (two) times daily. 60 tablet 0   aspirin EC 81 MG tablet Take 81 mg by mouth daily.     aspirin-acetaminophen-caffeine (EXCEDRIN MIGRAINE) 250-250-65 MG tablet Take 2 tablets by mouth every 6 (six) hours as needed for migraine.     carvedilol (COREG) 3.125 MG tablet Take 3.125 mg by mouth 2 (two) times daily with a meal.       Continuous Blood Gluc Sensor (FREESTYLE LIBRE 2 SENSOR) MISC APPLY ONE SENSOR EVERY 2 WEEKS.     desvenlafaxine (PRISTIQ) 50 MG 24 hr tablet Take 50 mg by mouth daily.     fenofibrate 54  MG tablet Take 54 mg by mouth daily.     ibuprofen (ADVIL,MOTRIN) 800 MG tablet Take 800 mg by mouth every 8 (eight) hours as needed (for migraines).      insulin NPH-regular Human (70-30) 100 UNIT/ML injection Inject 70 Units into the skin.     lisinopril (ZESTRIL) 5 MG tablet Take 5 mg by mouth 2 (two) times daily.     metFORMIN (GLUCOPHAGE) 500 MG tablet Take 3 tablets (1,500 mg total) by mouth 2 (two) times daily with a meal. Pt takes two tablets with breakfast and three tablets with dinner. (Patient taking differently: Take 1,000-1,500 mg by mouth See admin instructions. Take 1500 mg by mouth in the morning and 1000 mg in the evening) 150 tablet 1   montelukast (SINGULAIR) 10 MG tablet Take 10 mg by mouth at bedtime.     MOUNJARO 7.5 MG/0.5ML Pen SMARTSIG:1 Milliliter(s) SUB-Q Once a Week     nystatin (MYCOSTATIN/NYSTOP) powder Apply 1 application topically daily as needed (irritation).     ondansetron (ZOFRAN) 4 MG tablet Take 4 mg by mouth every 8 (eight) hours as needed for vomiting or nausea.     ondansetron (ZOFRAN-ODT) 4 MG disintegrating tablet Take 4 mg by mouth every 8 (eight) hours as needed.     pantoprazole (PROTONIX) 40 MG tablet Take 40 mg by mouth daily.     pravastatin (PRAVACHOL) 40  MG tablet Take 1 tablet (40 mg total) by mouth at bedtime. 30 tablet 5   traMADol (ULTRAM) 50 MG tablet Take 50 mg by mouth every 6 (six) hours as needed (for migraines).     zinc sulfate 220 (50 Zn) MG capsule Take 1 capsule (220 mg total) by mouth daily. 30 capsule 0   amoxicillin-clavulanate (AUGMENTIN) 875-125 MG tablet Take 1 tablet by mouth 2 (two) times daily. (Patient not taking: Reported on 06/15/2021) 60 tablet 0   Miconazole Nitrate 2 % POWD 200 mg by Does not apply route 2 (two) times daily as needed. (Patient not taking: Reported on 06/15/2021) 100 g 1   MOUNJARO 5 MG/0.5ML Pen Inject 5 mg into the skin every Sunday. (Patient not taking: Reported on 09/28/2021)     No current  facility-administered medications for this visit.    Past Medical History:  Diagnosis Date   Anxiety    Arthritis    r knee   Asthma    CAD (coronary artery disease)    a.) [facility]; non-obstructive CAD. b.) CTA with FFR 10/08/2018: extensive mixed plaque proximal to mid LAD (51-69%); Coronary Ca score 217; FFR 0.71 dPDA, 0.86 mLAD, 0.87 dLCx.   CCF (congestive cardiac failure) (HCC) 06/06/2008   a.) 30% EF. b.) TTE 06/03/2011: EF >55%; triv MR. c.) TTE 04/27/2018: EF 55%; mild LVH; triv PR, mild MR/TR; G1DD.   Complication of anesthesia    Eczema    Family history of adverse reaction to anesthesia    a.) PONV in mother and grandmother   GERD (gastroesophageal reflux disease) 05/10/2014   History of kidney stones    HLD (hyperlipidemia)    Migraines    Motion sickness    Panic attacks    Pneumonia    PONV (postoperative nausea and vomiting)    T2DM (type 2 diabetes mellitus) (HCC)    Takotsubo cardiomyopathy / transient apical balooning syndrome / stress-induced cardiomyopathy 05/10/2014   Unspecified essential hypertension     Past Surgical History:  Procedure Laterality Date   AMPUTATION TOE Left 03/16/2015   Procedure: left fifth toe amputation with incision and drainage;  Surgeon: Gwyneth RevelsJustin Fowler, DPM;  Location: [facility];  Service: Podiatry;  Laterality: Left;   APPLICATION OF WOUND VAC Left 02/14/2021   Procedure: APPLICATION OF WOUND VAC;  Surgeon: Gwyneth RevelsFowler, Justin, DPM;  Location: [facility];  Service: Podiatry;  Laterality: Left;   APPLICATION OF WOUND VAC Left 04/16/2021   Procedure: APPLICATION OF WOUND VAC;  Surgeon: Peggye Formillingham, Claire S, DO;  Location: [facility];  Service: Plastics;  Laterality: Left;   CARDIAC CATHETERIZATION Left 06/04/2009   Procedure: CARDIAC CATHETERIZATION; [facility]   CHOLECYSTECTOMY     DEBRIDEMENT AND CLOSURE WOUND Left 04/16/2021   Procedure: DEBRIDEMENT AND CLOSURE WOUND;  Surgeon:  Peggye Formillingham, Claire S, DO;  Location: [facility];  Service: Plastics;  Laterality: Left;  1 hour   IRRIGATION AND DEBRIDEMENT FOOT Left 02/08/2021   Procedure: IRRIGATION AND DEBRIDEMENT FOOT - LFT HEEL ULCER;  Surgeon: Gwyneth RevelsFowler, Justin, DPM;  Location: [facility];  Service: Podiatry;  Laterality: Left;   IRRIGATION AND DEBRIDEMENT FOOT Left 02/14/2021   Procedure: IRRIGATION AND DEBRIDEMENT LEFT HEEL;  Surgeon: Gwyneth RevelsFowler, Justin, DPM;  Location: [facility];  Service: Podiatry;  Laterality: Left;   kidney stone removal     KNEE ARTHROSCOPY W/ MENISCAL REPAIR     NOSE SURGERY  01/07/1989   due to fracture   TOE AMPUTATION  second toe   TONSILLECTOMY     TOTAL KNEE ARTHROPLASTY  01/11/2011   Procedure: TOTAL KNEE ARTHROPLASTY;  Surgeon: Harvie Junior;  Location: [facility];  Service: Orthopedics;  Laterality: Right;  COMPUTER ASSISTED TOTAL KNEE REPLACEMENT     Social History   Tobacco Use   Smoking status: Former    Packs/day: 0.00    Years: 30.00    Total pack years: 0.00    Types: Cigarettes    Quit date: 03/13/2015    Years since quitting: 6.5   Smokeless tobacco: Never  Vaping Use   Vaping Use: Never used  Substance Use Topics   Alcohol use: No   Drug use: No      Family History  Problem Relation Age of Onset   Transient ischemic attack Mother    Asthma Mother    Hypertension Mother    Lung cancer Father    Hypertension Father    Asthma Sister    Anxiety disorder Sister    Stomach cancer Maternal Grandmother    Colon cancer Maternal Grandmother    Leukemia Maternal Grandmother    Melanoma Maternal Grandfather    Asthma Sister    Hypertension Brother    Cancer Other    Crohn's disease Other    GER disease Other    Breast cancer Neg Hx      Allergies  Allergen Reactions   Shellfish Allergy Anaphylaxis   Codeine Other (See Comments)    Migraine   Morphine And Related Other (See Comments)    Reaction:  Severe migraines    Percocet  [Oxycodone-Acetaminophen] Nausea And Vomiting   Rosuvastatin     Other reaction(s): Muscle Pain   Penicillins Rash and Other (See Comments)    Is tolerating amoxicillin so no allergies   Sulfa Antibiotics Rash   Theophyllines Itching and Rash    REVIEW OF SYSTEMS (Negative unless checked)   Constitutional: [] Weight loss  [] Fever  [] Chills Cardiac: [] Chest pain   [] Chest pressure   [] Palpitations   [] Shortness of breath when laying flat   [] Shortness of breath at rest   [] Shortness of breath with exertion. Vascular:  [] Pain in legs with walking   [] Pain in legs at rest   [] Pain in legs when laying flat   [] Claudication   [] Pain in feet when walking  [] Pain in feet at rest  [] Pain in feet when laying flat   [] History of DVT   [] Phlebitis   [] Swelling in legs   [] Varicose veins   [x] Non-healing ulcers Pulmonary:   [] Uses home oxygen   [] Productive cough   [] Hemoptysis   [] Wheeze  [] COPD   [] Asthma Neurologic:  [] Dizziness  [] Blackouts   [] Seizures   [] History of stroke   [] History of TIA  [] Aphasia   [] Temporary blindness   [] Dysphagia   [] Weakness or numbness in arms   [x] Weakness or numbness in legs Musculoskeletal:  [x] Arthritis   [] Joint swelling   [] Joint pain   [] Low back pain Hematologic:  [] Easy bruising  [] Easy bleeding   [] Hypercoagulable state   [] Anemic  [] Hepatitis Gastrointestinal:  [] Blood in stool   [] Vomiting blood  [] Gastroesophageal reflux/heartburn   [] Abdominal pain Genitourinary:  [] Chronic kidney disease   [] Difficult urination  [] Frequent urination  [] Burning with urination   [] Hematuria Skin:  [] Rashes   [x] Ulcers   [x] Wounds Psychological:  [] History of anxiety   []  History of major depression.   Physical Examination  BP (!) 161/85 (BP Location: Left Arm)   Pulse 76  Resp 16   LMP 02/08/2013 (Within Months)  Gen:  WD/WN, NAD Head: [place]/AT, No temporalis wasting. Ear/Nose/Throat: Hearing grossly intact, nares w/o erythema or drainage Eyes: Conjunctiva clear.  Sclera non-icteric Neck: Supple.  Trachea midline Pulmonary:  Good air movement, no use of accessory muscles.  Cardiac: RRR, no JVD Vascular:  Vessel Right Left  Radial Palpable Palpable                          PT 1+ Palpable 1+ Palpable  DP 1+ Palpable 1+ Palpable   Musculoskeletal: M/S 5/5 throughout.  No deformity or atrophy.  Walking with a rolling scooter.  Left heel surgical site almost completely healed with only a small scab remaining.  No right lower extremity edema, 1-2+ left lower extremity edema. Neurologic: Sensation grossly intact in extremities.  Symmetrical.  Speech is fluent.  Psychiatric: Judgment intact, Mood & affect appropriate for pt's clinical situation. Dermatologic: Left heel surgical site with only a small scab remaining      Labs No results found for this or any previous visit (from the past 2160 hour(s)).  Radiology No results found.  Assessment/Plan Combined fat and carbohydrate induced hyperlipemia lipid control important in reducing the progression of atherosclerotic disease. Continue statin therapy  Essential hypertension blood pressure control important in reducing the progression of atherosclerotic disease. On appropriate oral medications.  Diabetes mellitus, type 2 (HCC) blood glucose control important in reducing the progression of atherosclerotic disease. Also, involved in wound healing. On appropriate medications.   Atherosclerosis of native arteries of the extremities with ulceration ([place]) ABIs today are likely noncompressible due to medial calcification but the waveforms are triphasic on the right and biphasic on the left with digit pressures of 147 on the right and 119 on the left.  Seems to have adequate perfusion for wound healing and her wound seems to be doing well.  No role for intervention unless she has worsening wound or infectious issues.  Plan to recheck in 1 year.    Leotis Pain, MD  09/28/2021 9:10 AM    This  note was created with Dragon medical transcription system.  Any errors from dictation are purely unintentional

## 2021-09-28 NOTE — Assessment & Plan Note (Signed)
ABIs today are likely noncompressible due to medial calcification but the waveforms are triphasic on the right and biphasic on the left with digit pressures of 147 on the right and 119 on the left.  Seems to have adequate perfusion for wound healing and her wound seems to be doing well.  No role for intervention unless she has worsening wound or infectious issues.  Plan to recheck in 1 year.

## 2021-09-28 NOTE — Assessment & Plan Note (Signed)
blood glucose control important in reducing the progression of atherosclerotic disease. Also, involved in wound healing. On appropriate medications.  

## 2021-10-08 DIAGNOSIS — Z794 Long term (current) use of insulin: Secondary | ICD-10-CM | POA: Diagnosis not present

## 2021-10-08 DIAGNOSIS — E1165 Type 2 diabetes mellitus with hyperglycemia: Secondary | ICD-10-CM | POA: Diagnosis not present

## 2021-10-08 DIAGNOSIS — E669 Obesity, unspecified: Secondary | ICD-10-CM | POA: Diagnosis not present

## 2021-10-08 DIAGNOSIS — Z7984 Long term (current) use of oral hypoglycemic drugs: Secondary | ICD-10-CM | POA: Diagnosis not present

## 2021-10-08 DIAGNOSIS — L97421 Non-pressure chronic ulcer of left heel and midfoot limited to breakdown of skin: Secondary | ICD-10-CM | POA: Diagnosis not present

## 2021-10-08 DIAGNOSIS — I129 Hypertensive chronic kidney disease with stage 1 through stage 4 chronic kidney disease, or unspecified chronic kidney disease: Secondary | ICD-10-CM | POA: Diagnosis not present

## 2021-10-08 DIAGNOSIS — E11621 Type 2 diabetes mellitus with foot ulcer: Secondary | ICD-10-CM | POA: Diagnosis not present

## 2021-10-08 DIAGNOSIS — E1122 Type 2 diabetes mellitus with diabetic chronic kidney disease: Secondary | ICD-10-CM | POA: Diagnosis not present

## 2021-10-08 DIAGNOSIS — D638 Anemia in other chronic diseases classified elsewhere: Secondary | ICD-10-CM | POA: Diagnosis not present

## 2021-10-08 DIAGNOSIS — N1832 Chronic kidney disease, stage 3b: Secondary | ICD-10-CM | POA: Diagnosis not present

## 2021-10-10 DIAGNOSIS — L97423 Non-pressure chronic ulcer of left heel and midfoot with necrosis of muscle: Secondary | ICD-10-CM | POA: Diagnosis not present

## 2021-10-10 DIAGNOSIS — E11621 Type 2 diabetes mellitus with foot ulcer: Secondary | ICD-10-CM | POA: Diagnosis not present

## 2021-10-18 DIAGNOSIS — L97423 Non-pressure chronic ulcer of left heel and midfoot with necrosis of muscle: Secondary | ICD-10-CM | POA: Diagnosis not present

## 2021-10-18 DIAGNOSIS — M25572 Pain in left ankle and joints of left foot: Secondary | ICD-10-CM | POA: Diagnosis not present

## 2021-10-18 DIAGNOSIS — R262 Difficulty in walking, not elsewhere classified: Secondary | ICD-10-CM | POA: Diagnosis not present

## 2021-10-26 DIAGNOSIS — L97423 Non-pressure chronic ulcer of left heel and midfoot with necrosis of muscle: Secondary | ICD-10-CM | POA: Diagnosis not present

## 2021-10-26 DIAGNOSIS — E11621 Type 2 diabetes mellitus with foot ulcer: Secondary | ICD-10-CM | POA: Diagnosis not present

## 2021-11-05 ENCOUNTER — Encounter (INDEPENDENT_AMBULATORY_CARE_PROVIDER_SITE_OTHER): Payer: Self-pay

## 2021-11-14 DIAGNOSIS — L97423 Non-pressure chronic ulcer of left heel and midfoot with necrosis of muscle: Secondary | ICD-10-CM | POA: Diagnosis not present

## 2021-11-14 DIAGNOSIS — E11621 Type 2 diabetes mellitus with foot ulcer: Secondary | ICD-10-CM | POA: Diagnosis not present

## 2021-11-14 DIAGNOSIS — L97509 Non-pressure chronic ulcer of other part of unspecified foot with unspecified severity: Secondary | ICD-10-CM | POA: Diagnosis not present

## 2021-12-19 DIAGNOSIS — L97423 Non-pressure chronic ulcer of left heel and midfoot with necrosis of muscle: Secondary | ICD-10-CM | POA: Diagnosis not present

## 2021-12-19 DIAGNOSIS — L97509 Non-pressure chronic ulcer of other part of unspecified foot with unspecified severity: Secondary | ICD-10-CM | POA: Diagnosis not present

## 2021-12-19 DIAGNOSIS — E11621 Type 2 diabetes mellitus with foot ulcer: Secondary | ICD-10-CM | POA: Diagnosis not present

## 2021-12-20 DIAGNOSIS — J069 Acute upper respiratory infection, unspecified: Secondary | ICD-10-CM | POA: Diagnosis not present

## 2021-12-20 DIAGNOSIS — J309 Allergic rhinitis, unspecified: Secondary | ICD-10-CM | POA: Diagnosis not present

## 2021-12-20 DIAGNOSIS — J209 Acute bronchitis, unspecified: Secondary | ICD-10-CM | POA: Diagnosis not present

## 2021-12-21 DIAGNOSIS — Z7984 Long term (current) use of oral hypoglycemic drugs: Secondary | ICD-10-CM | POA: Diagnosis not present

## 2021-12-21 DIAGNOSIS — E1140 Type 2 diabetes mellitus with diabetic neuropathy, unspecified: Secondary | ICD-10-CM | POA: Diagnosis not present

## 2021-12-21 DIAGNOSIS — L97423 Non-pressure chronic ulcer of left heel and midfoot with necrosis of muscle: Secondary | ICD-10-CM | POA: Diagnosis not present

## 2021-12-21 DIAGNOSIS — I110 Hypertensive heart disease with heart failure: Secondary | ICD-10-CM | POA: Diagnosis not present

## 2021-12-21 DIAGNOSIS — J45909 Unspecified asthma, uncomplicated: Secondary | ICD-10-CM | POA: Diagnosis not present

## 2021-12-21 DIAGNOSIS — I509 Heart failure, unspecified: Secondary | ICD-10-CM | POA: Diagnosis not present

## 2021-12-21 DIAGNOSIS — E11621 Type 2 diabetes mellitus with foot ulcer: Secondary | ICD-10-CM | POA: Diagnosis not present

## 2021-12-21 DIAGNOSIS — L97411 Non-pressure chronic ulcer of right heel and midfoot limited to breakdown of skin: Secondary | ICD-10-CM | POA: Diagnosis not present

## 2021-12-21 DIAGNOSIS — Z7985 Long-term (current) use of injectable non-insulin antidiabetic drugs: Secondary | ICD-10-CM | POA: Diagnosis not present

## 2021-12-21 DIAGNOSIS — Z794 Long term (current) use of insulin: Secondary | ICD-10-CM | POA: Diagnosis not present

## 2021-12-21 DIAGNOSIS — I2510 Atherosclerotic heart disease of native coronary artery without angina pectoris: Secondary | ICD-10-CM | POA: Diagnosis not present

## 2021-12-26 DIAGNOSIS — I110 Hypertensive heart disease with heart failure: Secondary | ICD-10-CM | POA: Diagnosis not present

## 2021-12-26 DIAGNOSIS — I509 Heart failure, unspecified: Secondary | ICD-10-CM | POA: Diagnosis not present

## 2021-12-26 DIAGNOSIS — E11621 Type 2 diabetes mellitus with foot ulcer: Secondary | ICD-10-CM | POA: Diagnosis not present

## 2021-12-26 DIAGNOSIS — E1140 Type 2 diabetes mellitus with diabetic neuropathy, unspecified: Secondary | ICD-10-CM | POA: Diagnosis not present

## 2021-12-26 DIAGNOSIS — Z7984 Long term (current) use of oral hypoglycemic drugs: Secondary | ICD-10-CM | POA: Diagnosis not present

## 2021-12-26 DIAGNOSIS — I2510 Atherosclerotic heart disease of native coronary artery without angina pectoris: Secondary | ICD-10-CM | POA: Diagnosis not present

## 2021-12-26 DIAGNOSIS — L97423 Non-pressure chronic ulcer of left heel and midfoot with necrosis of muscle: Secondary | ICD-10-CM | POA: Diagnosis not present

## 2021-12-26 DIAGNOSIS — Z7985 Long-term (current) use of injectable non-insulin antidiabetic drugs: Secondary | ICD-10-CM | POA: Diagnosis not present

## 2021-12-26 DIAGNOSIS — J45909 Unspecified asthma, uncomplicated: Secondary | ICD-10-CM | POA: Diagnosis not present

## 2021-12-26 DIAGNOSIS — Z794 Long term (current) use of insulin: Secondary | ICD-10-CM | POA: Diagnosis not present

## 2021-12-27 DIAGNOSIS — L97509 Non-pressure chronic ulcer of other part of unspecified foot with unspecified severity: Secondary | ICD-10-CM | POA: Diagnosis not present

## 2022-01-02 DIAGNOSIS — I2510 Atherosclerotic heart disease of native coronary artery without angina pectoris: Secondary | ICD-10-CM | POA: Diagnosis not present

## 2022-01-02 DIAGNOSIS — E1140 Type 2 diabetes mellitus with diabetic neuropathy, unspecified: Secondary | ICD-10-CM | POA: Diagnosis not present

## 2022-01-02 DIAGNOSIS — E11621 Type 2 diabetes mellitus with foot ulcer: Secondary | ICD-10-CM | POA: Diagnosis not present

## 2022-01-02 DIAGNOSIS — I509 Heart failure, unspecified: Secondary | ICD-10-CM | POA: Diagnosis not present

## 2022-01-02 DIAGNOSIS — Z794 Long term (current) use of insulin: Secondary | ICD-10-CM | POA: Diagnosis not present

## 2022-01-02 DIAGNOSIS — L97423 Non-pressure chronic ulcer of left heel and midfoot with necrosis of muscle: Secondary | ICD-10-CM | POA: Diagnosis not present

## 2022-01-02 DIAGNOSIS — I110 Hypertensive heart disease with heart failure: Secondary | ICD-10-CM | POA: Diagnosis not present

## 2022-01-02 DIAGNOSIS — Z7984 Long term (current) use of oral hypoglycemic drugs: Secondary | ICD-10-CM | POA: Diagnosis not present

## 2022-01-02 DIAGNOSIS — Z7985 Long-term (current) use of injectable non-insulin antidiabetic drugs: Secondary | ICD-10-CM | POA: Diagnosis not present

## 2022-01-02 DIAGNOSIS — J45909 Unspecified asthma, uncomplicated: Secondary | ICD-10-CM | POA: Diagnosis not present

## 2022-01-09 DIAGNOSIS — J45909 Unspecified asthma, uncomplicated: Secondary | ICD-10-CM | POA: Diagnosis not present

## 2022-01-09 DIAGNOSIS — I509 Heart failure, unspecified: Secondary | ICD-10-CM | POA: Diagnosis not present

## 2022-01-09 DIAGNOSIS — L97423 Non-pressure chronic ulcer of left heel and midfoot with necrosis of muscle: Secondary | ICD-10-CM | POA: Diagnosis not present

## 2022-01-09 DIAGNOSIS — Z794 Long term (current) use of insulin: Secondary | ICD-10-CM | POA: Diagnosis not present

## 2022-01-09 DIAGNOSIS — I2510 Atherosclerotic heart disease of native coronary artery without angina pectoris: Secondary | ICD-10-CM | POA: Diagnosis not present

## 2022-01-09 DIAGNOSIS — Z7984 Long term (current) use of oral hypoglycemic drugs: Secondary | ICD-10-CM | POA: Diagnosis not present

## 2022-01-09 DIAGNOSIS — I110 Hypertensive heart disease with heart failure: Secondary | ICD-10-CM | POA: Diagnosis not present

## 2022-01-09 DIAGNOSIS — E11621 Type 2 diabetes mellitus with foot ulcer: Secondary | ICD-10-CM | POA: Diagnosis not present

## 2022-01-09 DIAGNOSIS — Z7985 Long-term (current) use of injectable non-insulin antidiabetic drugs: Secondary | ICD-10-CM | POA: Diagnosis not present

## 2022-01-09 DIAGNOSIS — E1140 Type 2 diabetes mellitus with diabetic neuropathy, unspecified: Secondary | ICD-10-CM | POA: Diagnosis not present

## 2022-01-16 DIAGNOSIS — B351 Tinea unguium: Secondary | ICD-10-CM | POA: Diagnosis not present

## 2022-01-16 DIAGNOSIS — E11621 Type 2 diabetes mellitus with foot ulcer: Secondary | ICD-10-CM | POA: Diagnosis not present

## 2022-01-16 DIAGNOSIS — Z89422 Acquired absence of other left toe(s): Secondary | ICD-10-CM | POA: Diagnosis not present

## 2022-01-16 DIAGNOSIS — L97423 Non-pressure chronic ulcer of left heel and midfoot with necrosis of muscle: Secondary | ICD-10-CM | POA: Diagnosis not present

## 2022-01-16 DIAGNOSIS — L97509 Non-pressure chronic ulcer of other part of unspecified foot with unspecified severity: Secondary | ICD-10-CM | POA: Diagnosis not present

## 2022-01-18 DIAGNOSIS — E1140 Type 2 diabetes mellitus with diabetic neuropathy, unspecified: Secondary | ICD-10-CM | POA: Diagnosis not present

## 2022-01-18 DIAGNOSIS — Z7985 Long-term (current) use of injectable non-insulin antidiabetic drugs: Secondary | ICD-10-CM | POA: Diagnosis not present

## 2022-01-18 DIAGNOSIS — E11621 Type 2 diabetes mellitus with foot ulcer: Secondary | ICD-10-CM | POA: Diagnosis not present

## 2022-01-18 DIAGNOSIS — I509 Heart failure, unspecified: Secondary | ICD-10-CM | POA: Diagnosis not present

## 2022-01-18 DIAGNOSIS — I110 Hypertensive heart disease with heart failure: Secondary | ICD-10-CM | POA: Diagnosis not present

## 2022-01-18 DIAGNOSIS — L97423 Non-pressure chronic ulcer of left heel and midfoot with necrosis of muscle: Secondary | ICD-10-CM | POA: Diagnosis not present

## 2022-01-18 DIAGNOSIS — Z794 Long term (current) use of insulin: Secondary | ICD-10-CM | POA: Diagnosis not present

## 2022-01-18 DIAGNOSIS — I2510 Atherosclerotic heart disease of native coronary artery without angina pectoris: Secondary | ICD-10-CM | POA: Diagnosis not present

## 2022-01-18 DIAGNOSIS — Z7984 Long term (current) use of oral hypoglycemic drugs: Secondary | ICD-10-CM | POA: Diagnosis not present

## 2022-01-18 DIAGNOSIS — J45909 Unspecified asthma, uncomplicated: Secondary | ICD-10-CM | POA: Diagnosis not present

## 2022-01-23 DIAGNOSIS — Z7984 Long term (current) use of oral hypoglycemic drugs: Secondary | ICD-10-CM | POA: Diagnosis not present

## 2022-01-23 DIAGNOSIS — Z7985 Long-term (current) use of injectable non-insulin antidiabetic drugs: Secondary | ICD-10-CM | POA: Diagnosis not present

## 2022-01-23 DIAGNOSIS — I110 Hypertensive heart disease with heart failure: Secondary | ICD-10-CM | POA: Diagnosis not present

## 2022-01-23 DIAGNOSIS — E1140 Type 2 diabetes mellitus with diabetic neuropathy, unspecified: Secondary | ICD-10-CM | POA: Diagnosis not present

## 2022-01-23 DIAGNOSIS — Z794 Long term (current) use of insulin: Secondary | ICD-10-CM | POA: Diagnosis not present

## 2022-01-23 DIAGNOSIS — L97423 Non-pressure chronic ulcer of left heel and midfoot with necrosis of muscle: Secondary | ICD-10-CM | POA: Diagnosis not present

## 2022-01-23 DIAGNOSIS — I509 Heart failure, unspecified: Secondary | ICD-10-CM | POA: Diagnosis not present

## 2022-01-23 DIAGNOSIS — I2510 Atherosclerotic heart disease of native coronary artery without angina pectoris: Secondary | ICD-10-CM | POA: Diagnosis not present

## 2022-01-23 DIAGNOSIS — E11621 Type 2 diabetes mellitus with foot ulcer: Secondary | ICD-10-CM | POA: Diagnosis not present

## 2022-01-23 DIAGNOSIS — J45909 Unspecified asthma, uncomplicated: Secondary | ICD-10-CM | POA: Diagnosis not present

## 2022-01-29 ENCOUNTER — Encounter: Payer: Self-pay | Admitting: Plastic Surgery

## 2022-01-29 ENCOUNTER — Ambulatory Visit: Payer: 59 | Admitting: Plastic Surgery

## 2022-01-29 VITALS — BP 180/80 | HR 82

## 2022-01-29 DIAGNOSIS — L97424 Non-pressure chronic ulcer of left heel and midfoot with necrosis of bone: Secondary | ICD-10-CM | POA: Diagnosis not present

## 2022-01-29 DIAGNOSIS — G64 Other disorders of peripheral nervous system: Secondary | ICD-10-CM | POA: Diagnosis not present

## 2022-01-29 DIAGNOSIS — E11621 Type 2 diabetes mellitus with foot ulcer: Secondary | ICD-10-CM | POA: Diagnosis not present

## 2022-01-29 DIAGNOSIS — E668 Other obesity: Secondary | ICD-10-CM | POA: Diagnosis not present

## 2022-01-29 NOTE — Progress Notes (Signed)
   Subjective:    Patient ID: Nancy [name], female    DOB: [birth year], 57 y.o.   MRN: [id number]  The patient is a 57 year old female here for follow-up on her left heel.  In March she was seen by orthopedics for a blister.  She underwent debridement and it unfortunately got out of control.  At that time it was 5 x 9 cm.  A VAC was started.  Her Achilles tendon was exposed and some of it was resected.  She is 5 feet 9 inches tall and weighed 241 pounds at the time.  I took her to the operating room in April and we did an excision of the wound with placement of myriad and the VAC.  She was seen back in the office in May and in June but then did not come back after that until today.  The wound has now decreased in size significantly.  Overall she is doing really well.  She has been using the Vashe and Hydrofera Blue.  She is being followed by a podiatrist.  The wound is approximately 1 and half by 2 cm.  The only complaint is that it stalled and does not seem to be gaining progress.      Review of Systems  Constitutional:  Positive for activity change. Negative for appetite change.  Eyes: Negative.   Respiratory: Negative.    Cardiovascular: Negative.   Gastrointestinal: Negative.   Genitourinary: Negative.   Musculoskeletal: Negative.   Skin:  Positive for wound.       Objective:   Physical Exam Cardiovascular:     Rate and Rhythm: Normal rate.  Pulmonary:     Effort: Pulmonary effort is normal.  Musculoskeletal:        General: Swelling and tenderness present.  Skin:    General: Skin is warm.  Neurological:     Mental Status: She is alert and oriented to person, place, and time.  Psychiatric:        Mood and Affect: Mood normal.        Behavior: Behavior normal.        Thought Content: Thought content normal.        Judgment: Judgment normal.        Assessment & Plan:     ICD-10-CM   1. Extreme obesity  E66.8     2. Disorder of peripheral nervous system  G64      3. 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  I like to see her back in about a month.  I recommended she continue with her current regimen because there is going to be a change in who is doing the dressings so lets keep it the same for the next month.  She can leave the current dressing on with only changing the outer layers with KY for the next 3 days.  Pictures were obtained of the patient and placed in the chart with the patient's or guardian's permission.

## 2022-02-01 DIAGNOSIS — J45909 Unspecified asthma, uncomplicated: Secondary | ICD-10-CM | POA: Diagnosis not present

## 2022-02-01 DIAGNOSIS — I2510 Atherosclerotic heart disease of native coronary artery without angina pectoris: Secondary | ICD-10-CM | POA: Diagnosis not present

## 2022-02-01 DIAGNOSIS — E1140 Type 2 diabetes mellitus with diabetic neuropathy, unspecified: Secondary | ICD-10-CM | POA: Diagnosis not present

## 2022-02-01 DIAGNOSIS — Z7984 Long term (current) use of oral hypoglycemic drugs: Secondary | ICD-10-CM | POA: Diagnosis not present

## 2022-02-01 DIAGNOSIS — I509 Heart failure, unspecified: Secondary | ICD-10-CM | POA: Diagnosis not present

## 2022-02-01 DIAGNOSIS — Z7985 Long-term (current) use of injectable non-insulin antidiabetic drugs: Secondary | ICD-10-CM | POA: Diagnosis not present

## 2022-02-01 DIAGNOSIS — L97423 Non-pressure chronic ulcer of left heel and midfoot with necrosis of muscle: Secondary | ICD-10-CM | POA: Diagnosis not present

## 2022-02-01 DIAGNOSIS — E11621 Type 2 diabetes mellitus with foot ulcer: Secondary | ICD-10-CM | POA: Diagnosis not present

## 2022-02-01 DIAGNOSIS — Z794 Long term (current) use of insulin: Secondary | ICD-10-CM | POA: Diagnosis not present

## 2022-02-01 DIAGNOSIS — I110 Hypertensive heart disease with heart failure: Secondary | ICD-10-CM | POA: Diagnosis not present

## 2022-02-07 ENCOUNTER — Other Ambulatory Visit: Payer: Self-pay

## 2022-02-07 ENCOUNTER — Other Ambulatory Visit: Payer: Self-pay | Admitting: Family

## 2022-02-07 DIAGNOSIS — E1100 Type 2 diabetes mellitus with hyperosmolarity without nonketotic hyperglycemic-hyperosmolar coma (NKHHC): Secondary | ICD-10-CM

## 2022-02-07 DIAGNOSIS — E039 Hypothyroidism, unspecified: Secondary | ICD-10-CM

## 2022-02-07 DIAGNOSIS — E785 Hyperlipidemia, unspecified: Secondary | ICD-10-CM

## 2022-02-07 DIAGNOSIS — E119 Type 2 diabetes mellitus without complications: Secondary | ICD-10-CM

## 2022-02-07 DIAGNOSIS — I10 Essential (primary) hypertension: Secondary | ICD-10-CM

## 2022-02-07 DIAGNOSIS — E559 Vitamin D deficiency, unspecified: Secondary | ICD-10-CM | POA: Diagnosis not present

## 2022-02-07 DIAGNOSIS — E1165 Type 2 diabetes mellitus with hyperglycemia: Secondary | ICD-10-CM | POA: Diagnosis not present

## 2022-02-07 DIAGNOSIS — D519 Vitamin B12 deficiency anemia, unspecified: Secondary | ICD-10-CM | POA: Diagnosis not present

## 2022-02-07 DIAGNOSIS — E782 Mixed hyperlipidemia: Secondary | ICD-10-CM

## 2022-02-07 DIAGNOSIS — E538 Deficiency of other specified B group vitamins: Secondary | ICD-10-CM

## 2022-02-07 NOTE — Addendum Note (Signed)
Addended by: Georgian Co on: 02/07/2022 03:38 PM   Modules accepted: Orders

## 2022-02-08 DIAGNOSIS — Z7985 Long-term (current) use of injectable non-insulin antidiabetic drugs: Secondary | ICD-10-CM | POA: Diagnosis not present

## 2022-02-08 DIAGNOSIS — L97423 Non-pressure chronic ulcer of left heel and midfoot with necrosis of muscle: Secondary | ICD-10-CM | POA: Diagnosis not present

## 2022-02-08 DIAGNOSIS — Z7984 Long term (current) use of oral hypoglycemic drugs: Secondary | ICD-10-CM | POA: Diagnosis not present

## 2022-02-08 DIAGNOSIS — I2510 Atherosclerotic heart disease of native coronary artery without angina pectoris: Secondary | ICD-10-CM | POA: Diagnosis not present

## 2022-02-08 DIAGNOSIS — E1140 Type 2 diabetes mellitus with diabetic neuropathy, unspecified: Secondary | ICD-10-CM | POA: Diagnosis not present

## 2022-02-08 DIAGNOSIS — I509 Heart failure, unspecified: Secondary | ICD-10-CM | POA: Diagnosis not present

## 2022-02-08 DIAGNOSIS — E11621 Type 2 diabetes mellitus with foot ulcer: Secondary | ICD-10-CM | POA: Diagnosis not present

## 2022-02-08 DIAGNOSIS — I110 Hypertensive heart disease with heart failure: Secondary | ICD-10-CM | POA: Diagnosis not present

## 2022-02-08 DIAGNOSIS — Z794 Long term (current) use of insulin: Secondary | ICD-10-CM | POA: Diagnosis not present

## 2022-02-08 DIAGNOSIS — J45909 Unspecified asthma, uncomplicated: Secondary | ICD-10-CM | POA: Diagnosis not present

## 2022-02-08 LAB — COMPREHENSIVE METABOLIC PANEL
ALT: 27 IU/L (ref 0–32)
AST: 15 IU/L (ref 0–40)
Albumin/Globulin Ratio: 1.4 (ref 1.2–2.2)
Albumin: 3.8 g/dL (ref 3.8–4.9)
Alkaline Phosphatase: 49 IU/L (ref 44–121)
BUN/Creatinine Ratio: 29 — ABNORMAL HIGH (ref 9–23)
BUN: 22 mg/dL (ref 6–24)
Bilirubin Total: <0.2 mg/dL (ref 0.0–1.2)
CO2: 23 mmol/L (ref 20–29)
Calcium: 9.1 mg/dL (ref 8.7–10.2)
Chloride: 98 mmol/L (ref 96–106)
Creatinine, Ser: 0.75 mg/dL (ref 0.57–1.00)
Globulin, Total: 2.8 g/dL (ref 1.5–4.5)
Glucose: 286 mg/dL — ABNORMAL HIGH (ref 70–99)
Potassium: 5.3 mmol/L — ABNORMAL HIGH (ref 3.5–5.2)
Sodium: 138 mmol/L (ref 134–144)
Total Protein: 6.6 g/dL (ref 6.0–8.5)
eGFR: 93 mL/min/{1.73_m2} (ref >59)

## 2022-02-08 LAB — CBC WITH DIFFERENTIAL/PLATELET
Basophils Absolute: 0.2 10*3/uL (ref 0.0–0.2)
Basos: 2 %
EOS (ABSOLUTE): 0.4 10*3/uL (ref 0.0–0.4)
Eos: 5 %
Hematocrit: 40.1 % (ref 34.0–46.6)
Hemoglobin: 12.9 g/dL (ref 11.1–15.9)
Immature Grans (Abs): 0 10*3/uL (ref 0.0–0.1)
Immature Granulocytes: 1 %
Lymphocytes Absolute: 2.7 10*3/uL (ref 0.7–3.1)
Lymphs: 37 %
MCH: 29.2 pg (ref 26.6–33.0)
MCHC: 32.2 g/dL (ref 31.5–35.7)
MCV: 91 fL (ref 79–97)
Monocytes Absolute: 0.7 10*3/uL (ref 0.1–0.9)
Monocytes: 10 %
Neutrophils Absolute: 3.3 10*3/uL (ref 1.4–7.0)
Neutrophils: 45 %
Platelets: 311 10*3/uL (ref 150–450)
RBC: 4.42 x10E6/uL (ref 3.77–5.28)
RDW: 12.2 % (ref 11.7–15.4)
WBC: 7.4 10*3/uL (ref 3.4–10.8)

## 2022-02-08 LAB — LIPID PANEL W/O CHOL/HDL RATIO
Cholesterol, Total: 144 mg/dL (ref 100–199)
HDL: 33 mg/dL — ABNORMAL LOW (ref >39)
LDL Chol Calc (NIH): 76 mg/dL (ref 0–99)
Triglycerides: 211 mg/dL — ABNORMAL HIGH (ref 0–149)
VLDL Cholesterol Cal: 35 mg/dL (ref 5–40)

## 2022-02-08 LAB — HGB A1C W/O EAG: Hgb A1c MFr Bld: 8.6 % — ABNORMAL HIGH (ref 4.8–5.6)

## 2022-02-08 LAB — TSH: TSH: 1.6 u[IU]/mL (ref 0.450–4.500)

## 2022-02-11 DIAGNOSIS — E11621 Type 2 diabetes mellitus with foot ulcer: Secondary | ICD-10-CM | POA: Diagnosis not present

## 2022-02-11 DIAGNOSIS — L97423 Non-pressure chronic ulcer of left heel and midfoot with necrosis of muscle: Secondary | ICD-10-CM | POA: Diagnosis not present

## 2022-02-11 DIAGNOSIS — L97512 Non-pressure chronic ulcer of other part of right foot with fat layer exposed: Secondary | ICD-10-CM | POA: Diagnosis not present

## 2022-02-13 ENCOUNTER — Encounter: Payer: Self-pay | Admitting: Family

## 2022-02-13 ENCOUNTER — Ambulatory Visit: Payer: 59 | Admitting: Family

## 2022-02-13 VITALS — BP 140/82 | HR 79 | Ht 69.0 in | Wt 274.2 lb

## 2022-02-13 DIAGNOSIS — E039 Hypothyroidism, unspecified: Secondary | ICD-10-CM | POA: Diagnosis not present

## 2022-02-13 DIAGNOSIS — E11621 Type 2 diabetes mellitus with foot ulcer: Secondary | ICD-10-CM

## 2022-02-13 DIAGNOSIS — I25119 Atherosclerotic heart disease of native coronary artery with unspecified angina pectoris: Secondary | ICD-10-CM

## 2022-02-13 DIAGNOSIS — Z1231 Encounter for screening mammogram for malignant neoplasm of breast: Secondary | ICD-10-CM

## 2022-02-13 DIAGNOSIS — E119 Type 2 diabetes mellitus without complications: Secondary | ICD-10-CM | POA: Diagnosis not present

## 2022-02-13 DIAGNOSIS — I509 Heart failure, unspecified: Secondary | ICD-10-CM

## 2022-02-13 DIAGNOSIS — I10 Essential (primary) hypertension: Secondary | ICD-10-CM

## 2022-02-13 DIAGNOSIS — L97424 Non-pressure chronic ulcer of left heel and midfoot with necrosis of bone: Secondary | ICD-10-CM

## 2022-02-13 DIAGNOSIS — M86172 Other acute osteomyelitis, left ankle and foot: Secondary | ICD-10-CM

## 2022-02-13 DIAGNOSIS — G43E19 Chronic migraine with aura, intractable, without status migrainosus: Secondary | ICD-10-CM | POA: Diagnosis not present

## 2022-02-13 DIAGNOSIS — E782 Mixed hyperlipidemia: Secondary | ICD-10-CM | POA: Diagnosis not present

## 2022-02-13 DIAGNOSIS — E538 Deficiency of other specified B group vitamins: Secondary | ICD-10-CM | POA: Diagnosis not present

## 2022-02-13 DIAGNOSIS — E559 Vitamin D deficiency, unspecified: Secondary | ICD-10-CM | POA: Diagnosis not present

## 2022-02-13 DIAGNOSIS — Z79891 Long term (current) use of opiate analgesic: Secondary | ICD-10-CM | POA: Insufficient documentation

## 2022-02-13 HISTORY — DX: Long term (current) use of opiate analgesic: Z79.891

## 2022-02-13 HISTORY — DX: Other acute osteomyelitis, left ankle and foot: M86.172

## 2022-02-13 LAB — GLUCOSE, POCT (MANUAL RESULT ENTRY): POC Glucose: 158 mg/dl — AB (ref 70–99)

## 2022-02-13 MED ORDER — MONTELUKAST SODIUM 10 MG PO TABS: 10.0000 mg | ORAL_TABLET | Freq: Every day | ORAL | 1 refills | Status: DC

## 2022-02-13 MED ORDER — TRULICITY 4.5 MG/0.5ML ~~LOC~~ SOAJ: 4.5000 mg | SUBCUTANEOUS | 3 refills | Status: DC

## 2022-02-13 NOTE — Progress Notes (Signed)
Established Patient Office Visit  Subjective   Patient ID: Nancy [name], female    DOB: 30-Jun-1965  Age: 57 y.o. MRN: [id number]  No chief complaint on file.   Patient is here for her 3 month f/u.   She has been feeling well since her last appointment.    Due for labs No other concerns at this time  HM Mammogram         MAMMOGRAM (Every 2 Years) Next due on 03/02/2022   03/02/2020  MM 3D SCREEN BREAST BILATERAL   Only the first 1 history entries have been loaded, but more history  exists.           Health Maintenance reviewed - mammogram ordered, patient to schedule appointment, patient asked to schedule diabetic eye exam      Past Medical History:  Diagnosis Date   Anxiety    Arthritis    r knee   Asthma    CAD (coronary artery disease)    a.) LHC 06/04/2009 at [facility]; non-obstructive CAD. b.) CTA with FFR 10/08/2018: extensive mixed plaque proximal to mid LAD (51-69%); Coronary Ca score 217; FFR 0.71 dPDA, 0.86 mLAD, 0.87 dLCx.   CCF (congestive cardiac failure) ([place]) 06/06/2008   a.) 30% EF. b.) TTE 06/03/2011: EF >55%; triv MR. c.) TTE 04/27/2018: EF 55%; mild LVH; triv PR, mild MR/TR; G1DD.   Complication of anesthesia    Eczema    Family history of adverse reaction to anesthesia    a.) PONV in mother and grandmother   GERD (gastroesophageal reflux disease) 05/10/2014   History of kidney stones    HLD (hyperlipidemia)    Migraines    Motion sickness    Panic attacks    Pneumonia    PONV (postoperative nausea and vomiting)    T2DM (type 2 diabetes mellitus) (HCC)    Takotsubo cardiomyopathy / transient apical balooning syndrome / stress-induced cardiomyopathy 05/10/2014   Unspecified essential hypertension     Social History   Socioeconomic History   Marital status: Married    Spouse name: Not on file   Number of children: Not on file   Years of education: Not on file   Highest education level: Not on file  Occupational History   Not on  file  Tobacco Use   Smoking status: Former    Packs/day: 0.00    Years: 30.00    Total pack years: 0.00    Types: Cigarettes    Quit date: 03/13/2015    Years since quitting: 6.9   Smokeless tobacco: Never  Vaping Use   Vaping Use: Never used  Substance and Sexual Activity   Alcohol use: No   Drug use: No   Sexual activity: Yes    Birth control/protection: Post-menopausal  Other Topics Concern   Not on file  Social History Narrative   Full time. Does not regularly exercise. Domestic partner.    Social Determinants of Health   Financial Resource Strain: Not on file  Food Insecurity: Not on file  Transportation Needs: Not on file  Physical Activity: Not on file  Stress: Not on file  Social Connections: Not on file  Intimate Partner Violence: Not on file    Family History  Problem Relation Age of Onset   Transient ischemic attack Mother    Asthma Mother    Hypertension Mother    Lung cancer Father    Hypertension Father    Asthma Sister    Anxiety disorder Sister  Stomach cancer Maternal Grandmother    Colon cancer Maternal Grandmother    Leukemia Maternal Grandmother    Melanoma Maternal Grandfather    Asthma Sister    Hypertension Brother    Cancer Other    Crohn's disease Other    GER disease Other    Breast cancer Neg Hx     Allergies  Allergen Reactions   Shellfish Allergy Anaphylaxis   Codeine Other (See Comments)    Migraine   Morphine And Related Other (See Comments)    Reaction:  Severe migraines    Percocet [Oxycodone-Acetaminophen] Nausea And Vomiting   Rosuvastatin     Other reaction(s): Muscle Pain   Penicillins Rash and Other (See Comments)    Is tolerating amoxicillin so no allergies   Sulfa Antibiotics Rash   Theophyllines Itching and Rash    ROS     Objective:     BP (!) 140/82 (BP Location: Right Arm, Patient Position: Sitting, Cuff Size: Normal)   Pulse 79   Ht 5\' 9"  (1.753 m)   Wt 274 lb 3.2 oz (124.4 kg)   LMP  02/08/2013 (Within Months)   SpO2 97%   BMI 40.49 kg/m   Vitals:   02/13/22 1316  BP: (!) 140/82  Pulse: 79  Height: 5\' 9"  (1.753 m)  Weight: 274 lb 3.2 oz (124.4 kg)  SpO2: 97%  BMI (Calculated): 40.47    Physical Exam   No results found for any visits on 02/13/22.  Recent Results (from the past 2160 hour(s))  CBC with Differential/Platelet     Status: None   Collection Time: 02/07/22  4:34 PM  Result Value Ref Range   WBC 7.4 3.4 - 10.8 x10E3/uL   RBC 4.42 3.77 - 5.28 x10E6/uL   Hemoglobin 12.9 11.1 - 15.9 g/dL   Hematocrit 40.1 34.0 - 46.6 %   MCV 91 79 - 97 fL   MCH 29.2 26.6 - 33.0 pg   MCHC 32.2 31.5 - 35.7 g/dL   RDW 12.2 11.7 - 15.4 %   Platelets 311 150 - 450 x10E3/uL   Neutrophils 45 Not Estab. %   Lymphs 37 Not Estab. %   Monocytes 10 Not Estab. %   Eos 5 Not Estab. %   Basos 2 Not Estab. %   Neutrophils Absolute 3.3 1.4 - 7.0 x10E3/uL   Lymphocytes Absolute 2.7 0.7 - 3.1 x10E3/uL   Monocytes Absolute 0.7 0.1 - 0.9 x10E3/uL   EOS (ABSOLUTE) 0.4 0.0 - 0.4 x10E3/uL   Basophils Absolute 0.2 0.0 - 0.2 x10E3/uL   Immature Granulocytes 1 Not Estab. %   Immature Grans (Abs) 0.0 0.0 - 0.1 x10E3/uL  Comprehensive metabolic panel     Status: Abnormal   Collection Time: 02/07/22  4:34 PM  Result Value Ref Range   Glucose 286 (H) 70 - 99 mg/dL   BUN 22 6 - 24 mg/dL   Creatinine, Ser 0.75 0.57 - 1.00 mg/dL   eGFR 93 >59 mL/min/1.73   BUN/Creatinine Ratio 29 (H) 9 - 23   Sodium 138 134 - 144 mmol/L   Potassium 5.3 (H) 3.5 - 5.2 mmol/L   Chloride 98 96 - 106 mmol/L   CO2 23 20 - 29 mmol/L   Calcium 9.1 8.7 - 10.2 mg/dL   Total Protein 6.6 6.0 - 8.5 g/dL   Albumin 3.8 3.8 - 4.9 g/dL   Globulin, Total 2.8 1.5 - 4.5 g/dL   Albumin/Globulin Ratio 1.4 1.2 - 2.2   Bilirubin Total <0.2 0.0 -  1.2 mg/dL   Alkaline Phosphatase 49 44 - 121 IU/L   AST 15 0 - 40 IU/L   ALT 27 0 - 32 IU/L  Lipid Panel w/o Chol/HDL Ratio     Status: Abnormal   Collection Time: 02/07/22   4:34 PM  Result Value Ref Range   Cholesterol, Total 144 100 - 199 mg/dL   Triglycerides 211 (H) 0 - 149 mg/dL   HDL 33 (L) >39 mg/dL   VLDL Cholesterol Cal 35 5 - 40 mg/dL   LDL Chol Calc (NIH) 76 0 - 99 mg/dL  Hgb A1c w/o eAG     Status: Abnormal   Collection Time: 02/07/22  4:34 PM  Result Value Ref Range   Hgb A1c MFr Bld 8.6 (H) 4.8 - 5.6 %    Comment:          Prediabetes: 5.7 - 6.4          Diabetes: >6.4          Glycemic control for adults with diabetes: <7.0   TSH     Status: None   Collection Time: 02/07/22  4:34 PM  Result Value Ref Range   TSH 1.600 0.450 - 4.500 uIU/mL      Assessment & Plan:   Problem List Items Addressed This Visit     Diabetes mellitus, type 2 ([place]) - Primary   Relevant Medications   Dulaglutide (TRULICITY) 4.5 ZO/1.0RU SOPN   Other Relevant Orders   POCT glucose (manual entry)   CBC With Differential   CMP14+EGFR   Hemoglobin A1c   Other Visit Diagnoses     B12 deficiency due to diet       Relevant Orders   Vitamin B12   Vitamin D deficiency, unspecified       Relevant Orders   VITAMIN D 25 Hydroxy (Vit-D Deficiency, Fractures)   Mixed hyperlipidemia       Relevant Orders   Lipid panel   CBC With Differential   CMP14+EGFR   Essential hypertension, benign       Hypothyroidism (acquired)       Relevant Orders   TSH       No follow-ups on file.   Total time spent: 45 minutes  [place], FNP

## 2022-02-13 NOTE — Assessment & Plan Note (Addendum)
Continue current medications.  Patient has asked to set up care with Dr. Humphrey Rolls for Cardiology, so I will get this sent to the front to schedule.

## 2022-02-13 NOTE — Assessment & Plan Note (Signed)
Checking B12 in office today.   Will adjust meds as needed.

## 2022-02-13 NOTE — Assessment & Plan Note (Signed)
Blood work reviewed today.  Increasing Trulicity dose. Reinforced the need for strict dietary control.

## 2022-02-14 LAB — VITAMIN B12: Vitamin B-12: >2000 pg/mL — ABNORMAL HIGH (ref 232–1245)

## 2022-02-14 LAB — SPECIMEN STATUS REPORT

## 2022-02-15 DIAGNOSIS — Z7985 Long-term (current) use of injectable non-insulin antidiabetic drugs: Secondary | ICD-10-CM | POA: Diagnosis not present

## 2022-02-15 DIAGNOSIS — L97423 Non-pressure chronic ulcer of left heel and midfoot with necrosis of muscle: Secondary | ICD-10-CM | POA: Diagnosis not present

## 2022-02-15 DIAGNOSIS — E1140 Type 2 diabetes mellitus with diabetic neuropathy, unspecified: Secondary | ICD-10-CM | POA: Diagnosis not present

## 2022-02-15 DIAGNOSIS — Z794 Long term (current) use of insulin: Secondary | ICD-10-CM | POA: Diagnosis not present

## 2022-02-15 DIAGNOSIS — I110 Hypertensive heart disease with heart failure: Secondary | ICD-10-CM | POA: Diagnosis not present

## 2022-02-15 DIAGNOSIS — E11621 Type 2 diabetes mellitus with foot ulcer: Secondary | ICD-10-CM | POA: Diagnosis not present

## 2022-02-15 DIAGNOSIS — J45909 Unspecified asthma, uncomplicated: Secondary | ICD-10-CM | POA: Diagnosis not present

## 2022-02-15 DIAGNOSIS — I2510 Atherosclerotic heart disease of native coronary artery without angina pectoris: Secondary | ICD-10-CM | POA: Diagnosis not present

## 2022-02-15 DIAGNOSIS — I509 Heart failure, unspecified: Secondary | ICD-10-CM | POA: Diagnosis not present

## 2022-02-15 DIAGNOSIS — Z7984 Long term (current) use of oral hypoglycemic drugs: Secondary | ICD-10-CM | POA: Diagnosis not present

## 2022-02-18 DIAGNOSIS — Z7985 Long-term (current) use of injectable non-insulin antidiabetic drugs: Secondary | ICD-10-CM | POA: Diagnosis not present

## 2022-02-18 DIAGNOSIS — I110 Hypertensive heart disease with heart failure: Secondary | ICD-10-CM | POA: Diagnosis not present

## 2022-02-18 DIAGNOSIS — I2510 Atherosclerotic heart disease of native coronary artery without angina pectoris: Secondary | ICD-10-CM | POA: Diagnosis not present

## 2022-02-18 DIAGNOSIS — I509 Heart failure, unspecified: Secondary | ICD-10-CM | POA: Diagnosis not present

## 2022-02-18 DIAGNOSIS — E11621 Type 2 diabetes mellitus with foot ulcer: Secondary | ICD-10-CM | POA: Diagnosis not present

## 2022-02-18 DIAGNOSIS — Z7984 Long term (current) use of oral hypoglycemic drugs: Secondary | ICD-10-CM | POA: Diagnosis not present

## 2022-02-18 DIAGNOSIS — J45909 Unspecified asthma, uncomplicated: Secondary | ICD-10-CM | POA: Diagnosis not present

## 2022-02-18 DIAGNOSIS — L97423 Non-pressure chronic ulcer of left heel and midfoot with necrosis of muscle: Secondary | ICD-10-CM | POA: Diagnosis not present

## 2022-02-18 DIAGNOSIS — Z794 Long term (current) use of insulin: Secondary | ICD-10-CM | POA: Diagnosis not present

## 2022-02-18 DIAGNOSIS — E1140 Type 2 diabetes mellitus with diabetic neuropathy, unspecified: Secondary | ICD-10-CM | POA: Diagnosis not present

## 2022-02-20 ENCOUNTER — Other Ambulatory Visit: Payer: Self-pay | Admitting: Family

## 2022-02-22 ENCOUNTER — Encounter: Payer: Self-pay | Admitting: Family

## 2022-02-22 ENCOUNTER — Ambulatory Visit: Payer: 59 | Admitting: Family

## 2022-02-22 VITALS — BP 134/78 | HR 77 | Ht 69.0 in | Wt 278.0 lb

## 2022-02-22 DIAGNOSIS — S80921A Unspecified superficial injury of right lower leg, initial encounter: Secondary | ICD-10-CM

## 2022-02-22 DIAGNOSIS — E11621 Type 2 diabetes mellitus with foot ulcer: Secondary | ICD-10-CM | POA: Diagnosis not present

## 2022-02-22 DIAGNOSIS — T63301A Toxic effect of unspecified spider venom, accidental (unintentional), initial encounter: Secondary | ICD-10-CM | POA: Diagnosis not present

## 2022-02-22 DIAGNOSIS — L97509 Non-pressure chronic ulcer of other part of unspecified foot with unspecified severity: Secondary | ICD-10-CM | POA: Diagnosis not present

## 2022-02-22 MED ORDER — LEVOFLOXACIN 500 MG PO TABS: 500.0000 mg | ORAL_TABLET | Freq: Every day | ORAL | 0 refills | Status: AC

## 2022-02-22 NOTE — Progress Notes (Signed)
   Acute Office Visit  Subjective:     Patient ID: Nancy [name], female    DOB: 02-Dec-1965, 57 y.o.   MRN: [id number]  Chief Complaint  Patient presents with  . Follow-up    Bug bite on right leg    Pt. Here today for a bug bite on her leg.  She noticed it a day or two ago, but it has spread and worsened since she first saw it.   The area is warm to the touch and tender.   No other concerns today.   Patient is in today for bug bite on leg.     Review of Systems  Skin:        Bite on right lower medial leg (approx 3" above ankle).   All other systems reviewed and are negative.       Objective:    BP 134/78   Pulse 77   Ht 5' 9"$  (1.753 m)   Wt 278 lb (126.1 kg)   LMP 02/08/2013 (Within Months)   SpO2 96%   BMI 41.05 kg/m   Physical Exam  No results found for any visits on 02/22/22.      Assessment & Plan:   Problem List Items Addressed This Visit   None    No follow-ups on file.  Total time spent: 15 minutes  Mechele Claude, FNP  02/22/2022

## 2022-03-01 DIAGNOSIS — Z7985 Long-term (current) use of injectable non-insulin antidiabetic drugs: Secondary | ICD-10-CM | POA: Diagnosis not present

## 2022-03-01 DIAGNOSIS — Z7984 Long term (current) use of oral hypoglycemic drugs: Secondary | ICD-10-CM | POA: Diagnosis not present

## 2022-03-01 DIAGNOSIS — Z794 Long term (current) use of insulin: Secondary | ICD-10-CM | POA: Diagnosis not present

## 2022-03-01 DIAGNOSIS — I509 Heart failure, unspecified: Secondary | ICD-10-CM | POA: Diagnosis not present

## 2022-03-01 DIAGNOSIS — I2510 Atherosclerotic heart disease of native coronary artery without angina pectoris: Secondary | ICD-10-CM | POA: Diagnosis not present

## 2022-03-01 DIAGNOSIS — I110 Hypertensive heart disease with heart failure: Secondary | ICD-10-CM | POA: Diagnosis not present

## 2022-03-01 DIAGNOSIS — L97411 Non-pressure chronic ulcer of right heel and midfoot limited to breakdown of skin: Secondary | ICD-10-CM | POA: Diagnosis not present

## 2022-03-01 DIAGNOSIS — E1140 Type 2 diabetes mellitus with diabetic neuropathy, unspecified: Secondary | ICD-10-CM | POA: Diagnosis not present

## 2022-03-01 DIAGNOSIS — E11621 Type 2 diabetes mellitus with foot ulcer: Secondary | ICD-10-CM | POA: Diagnosis not present

## 2022-03-01 DIAGNOSIS — L97423 Non-pressure chronic ulcer of left heel and midfoot with necrosis of muscle: Secondary | ICD-10-CM | POA: Diagnosis not present

## 2022-03-09 ENCOUNTER — Other Ambulatory Visit: Payer: Self-pay | Admitting: Family

## 2022-03-09 DIAGNOSIS — E782 Mixed hyperlipidemia: Secondary | ICD-10-CM

## 2022-03-10 ENCOUNTER — Other Ambulatory Visit: Payer: Self-pay | Admitting: Family

## 2022-03-11 ENCOUNTER — Ambulatory Visit: Payer: 59 | Admitting: Physician Assistant

## 2022-03-12 ENCOUNTER — Other Ambulatory Visit: Payer: Self-pay | Admitting: Family

## 2022-03-15 ENCOUNTER — Ambulatory Visit: Payer: 59 | Admitting: Physician Assistant

## 2022-03-15 DIAGNOSIS — E11621 Type 2 diabetes mellitus with foot ulcer: Secondary | ICD-10-CM | POA: Diagnosis not present

## 2022-03-15 DIAGNOSIS — L97424 Non-pressure chronic ulcer of left heel and midfoot with necrosis of bone: Secondary | ICD-10-CM | POA: Diagnosis not present

## 2022-03-15 NOTE — Progress Notes (Signed)
   Referring Provider Mechele Claude, FNP [address] [place],   [address]   CC: No chief complaint on file.     Nancy [name] is an 57 y.o. female.  HPI: This is a 57 year old female seen in our office for follow-up evaluation of her left heel.  To recap, she was originally seen in March 2023 by orthopedics for a blister to her left heel.  She underwent debridement and unfortunately the wound enlarged.  The wound reached approximately 5 x 9 cm, wound VAC was started, her Achilles tendon was exposed at some point and was resected.  Dr. Marla Roe took her to the operating room in April and performed an excision of the wound with placement of myriad and a VAC.  She was seen back in the office in May and June for follow-up evaluations.  She was most recently seen in the office on 01/29/2022, at that time the wound had decreased in size significantly.  She had been using Vashe and Hydrofera Blue and is being followed by podiatrist.  At the her last visit the wound measured approximately 1.5 x 2 cm.  She notes it has somewhat stabilized at that size.  Since her last office visit she notes she has been doing well.  She continues to follow along with her podiatrist to debrides the wound once a month, she notes that she has a follow-up appointment next week with him.  She has been using collagen sheets twice a week.  She notes that the wound appears to be getting smaller and is very happy with this.  She denies any other concerns today.    Review of Systems General: negative for fever  Physical Exam    02/22/2022    9:56 AM 02/13/2022    1:16 PM 01/29/2022    2:23 PM  Vitals with BMI  Height 5\' 9"  5\' 9"    Weight 278 lbs 274 lbs 3 oz   BMI 01.60 10.93   Systolic 235 573 220  Diastolic 78 82 80  Pulse 77 79 82    General:  No acute distress,  Alert and oriented, Non-Toxic, Normal speech and affect 1.5 x 2 cm wound to the left heel with no significant exudate, granulation tissue noted  with some associated slough, no surrounding redness, wound edges are clean  Assessment/Plan This is a 57 year old female with a chronic wound to the left heel.  She notes that the wound appears better to her, size wise it is very similar to previous.  It does look clean with good granulation tissue.  I did note that superior to the wound there is a small area of thinner skin.  I did debride some of the surrounding collagen sheet and dead tissue.  The patient notes she is following along with her podiatrist, she has a follow-up appointment with him in a several days, he does a reported thorough evaluation with debridement.  I did not debride the wound today, she will continue the collagen sheets and close follow-up with podiatry.  I like to see her in approximately 1.5 months staggering her visits with the podiatrist.  I did not apply any product today as she will be seeing him shortly and it will be removed.  The patient was given strict return precautions, she verbalized understanding and agreement to today's plan.  Nancy [name] Nancy [name] 03/15/2022, 8:23 AM

## 2022-03-20 DIAGNOSIS — L97423 Non-pressure chronic ulcer of left heel and midfoot with necrosis of muscle: Secondary | ICD-10-CM | POA: Diagnosis not present

## 2022-03-20 DIAGNOSIS — E11621 Type 2 diabetes mellitus with foot ulcer: Secondary | ICD-10-CM | POA: Diagnosis not present

## 2022-03-20 DIAGNOSIS — L03031 Cellulitis of right toe: Secondary | ICD-10-CM | POA: Diagnosis not present

## 2022-03-20 DIAGNOSIS — L97509 Non-pressure chronic ulcer of other part of unspecified foot with unspecified severity: Secondary | ICD-10-CM | POA: Diagnosis not present

## 2022-03-20 DIAGNOSIS — L02611 Cutaneous abscess of right foot: Secondary | ICD-10-CM | POA: Diagnosis not present

## 2022-03-21 ENCOUNTER — Other Ambulatory Visit: Payer: Self-pay

## 2022-03-23 MED ORDER — ALPRAZOLAM 0.5 MG PO TABS: ORAL_TABLET | ORAL | 0 refills | Status: DC

## 2022-03-25 DIAGNOSIS — L97423 Non-pressure chronic ulcer of left heel and midfoot with necrosis of muscle: Secondary | ICD-10-CM | POA: Diagnosis not present

## 2022-03-25 DIAGNOSIS — E11621 Type 2 diabetes mellitus with foot ulcer: Secondary | ICD-10-CM | POA: Diagnosis not present

## 2022-03-25 DIAGNOSIS — L02611 Cutaneous abscess of right foot: Secondary | ICD-10-CM | POA: Diagnosis not present

## 2022-03-25 DIAGNOSIS — L03031 Cellulitis of right toe: Secondary | ICD-10-CM | POA: Diagnosis not present

## 2022-03-25 DIAGNOSIS — L97509 Non-pressure chronic ulcer of other part of unspecified foot with unspecified severity: Secondary | ICD-10-CM | POA: Diagnosis not present

## 2022-03-31 ENCOUNTER — Other Ambulatory Visit: Payer: Self-pay | Admitting: Family

## 2022-05-08 DIAGNOSIS — E11621 Type 2 diabetes mellitus with foot ulcer: Secondary | ICD-10-CM | POA: Diagnosis not present

## 2022-05-08 DIAGNOSIS — L97423 Non-pressure chronic ulcer of left heel and midfoot with necrosis of muscle: Secondary | ICD-10-CM | POA: Diagnosis not present

## 2022-05-08 DIAGNOSIS — L97509 Non-pressure chronic ulcer of other part of unspecified foot with unspecified severity: Secondary | ICD-10-CM | POA: Diagnosis not present

## 2022-05-15 ENCOUNTER — Ambulatory Visit: Payer: 59 | Admitting: Physician Assistant

## 2022-05-15 ENCOUNTER — Ambulatory Visit: Payer: 59 | Admitting: Family

## 2022-05-15 ENCOUNTER — Other Ambulatory Visit: Payer: Self-pay | Admitting: Family

## 2022-05-15 ENCOUNTER — Encounter: Payer: Self-pay | Admitting: Family

## 2022-05-15 VITALS — BP 132/80 | HR 65 | Ht 69.0 in | Wt 278.4 lb

## 2022-05-15 DIAGNOSIS — M86172 Other acute osteomyelitis, left ankle and foot: Secondary | ICD-10-CM

## 2022-05-15 DIAGNOSIS — L97424 Non-pressure chronic ulcer of left heel and midfoot with necrosis of bone: Secondary | ICD-10-CM

## 2022-05-15 DIAGNOSIS — I10 Essential (primary) hypertension: Secondary | ICD-10-CM

## 2022-05-15 DIAGNOSIS — I25119 Atherosclerotic heart disease of native coronary artery with unspecified angina pectoris: Secondary | ICD-10-CM | POA: Diagnosis not present

## 2022-05-15 DIAGNOSIS — E782 Mixed hyperlipidemia: Secondary | ICD-10-CM

## 2022-05-15 DIAGNOSIS — E559 Vitamin D deficiency, unspecified: Secondary | ICD-10-CM

## 2022-05-15 DIAGNOSIS — E538 Deficiency of other specified B group vitamins: Secondary | ICD-10-CM | POA: Diagnosis not present

## 2022-05-15 DIAGNOSIS — R5383 Other fatigue: Secondary | ICD-10-CM

## 2022-05-15 DIAGNOSIS — I50812 Chronic right heart failure: Secondary | ICD-10-CM | POA: Diagnosis not present

## 2022-05-15 DIAGNOSIS — E11621 Type 2 diabetes mellitus with foot ulcer: Secondary | ICD-10-CM

## 2022-05-15 DIAGNOSIS — E1100 Type 2 diabetes mellitus with hyperosmolarity without nonketotic hyperglycemic-hyperosmolar coma (NKHHC): Secondary | ICD-10-CM | POA: Diagnosis not present

## 2022-05-15 DIAGNOSIS — E1142 Type 2 diabetes mellitus with diabetic polyneuropathy: Secondary | ICD-10-CM

## 2022-05-15 NOTE — Progress Notes (Signed)
   Referring Provider Miki Kins, FNP [address] [place],  [place] [address]   CC: No chief complaint on file.     Nancy [name] is an 57 y.o. female.   HPI: This is a 57 year old female seen in our office for follow-up evaluation of her left heel.  To recap, she was originally seen in March 2023 by orthopedics for a blister to her left heel.  She underwent debridement and unfortunately the wound enlarged.  The wound reached approximately 5 x 9 cm, wound VAC was started, her Achilles tendon was exposed at some point and was resected.  Dr. Ulice Bold took her to the operating room in April and performed an excision of the wound with placement of myriad and a VAC.  She was seen back in the office in May and June for follow-up evaluations.  She was recently seen in the office on 01/29/2022, at that time the wound had decreased in size significantly.  She had been using Vashe and Hydrofera Blue and is being followed by podiatrist.  At that visit the wound measured approximately 1.5 x 2 cm.  She then followed up again on 03/15/2022.  At that time she had noted some improvement in her wound.  She has been using collagen sheets and continues to be seen by podiatry as well.  She is happy with the progression but would like to have this wound healed.  She notes overall she has been doing well.    Review of Systems General: Positive for wound  Physical Exam    02/22/2022    9:56 AM 02/13/2022    1:16 PM 01/29/2022    2:23 PM  Vitals with BMI  Height 5\' 9"  5\' 9"    Weight 278 lbs 274 lbs 3 oz   BMI 41.03 40.47   Systolic 134 140 604  Diastolic 78 82 80  Pulse 77 79 82    General:  No acute distress,  Alert and oriented, Non-Toxic, Normal speech and affect There are 2 areas along the heel that are separated by some fibrous tissue, 1 that is approximately 1.5 x 1.5, the other approximately 1 cm x 1 cm with approximately 50% granulation tissue, no significant exudate, no surrounding  redness.  This photo was submitted to me from the patient and was taken 2 days ago   Assessment/Plan  Overall the patient continues to improve, the wound edges are clean and the wound is smaller.  She has been using collagen sheets on this.  She does note that previously when we did use myriad she had significant improvement.  I debrided the wound and remove some of the fibrous material.  I applied donated had powder and sheet to the wounds, and covered this with Adaptic dressing and K-Y jelly.  The patient will change the outer dressing daily, she will apply K-Y jelly and maintain a degree of moisture.  I would like to see her back in our office in 3 weeks for repeat evaluation or sooner as needed, they will reach out to me if the wound does not improve or worsens, the patient verbalized understanding and agreement to this plan had no further questions or concerns.  Kelle Darting Fredi Geiler 05/15/2022, 11:03 AM

## 2022-05-16 ENCOUNTER — Encounter: Payer: Self-pay | Admitting: Family

## 2022-05-16 DIAGNOSIS — E559 Vitamin D deficiency, unspecified: Secondary | ICD-10-CM | POA: Insufficient documentation

## 2022-05-16 DIAGNOSIS — E538 Deficiency of other specified B group vitamins: Secondary | ICD-10-CM | POA: Insufficient documentation

## 2022-05-16 LAB — CMP14+EGFR
ALT: 24 IU/L (ref 0–32)
AST: 20 IU/L (ref 0–40)
Albumin/Globulin Ratio: 1.3 (ref 1.2–2.2)
Albumin: 4 g/dL (ref 3.8–4.9)
Alkaline Phosphatase: 50 IU/L (ref 44–121)
BUN/Creatinine Ratio: 24 — ABNORMAL HIGH (ref 9–23)
BUN: 24 mg/dL (ref 6–24)
Bilirubin Total: 0.2 mg/dL (ref 0.0–1.2)
CO2: 24 mmol/L (ref 20–29)
Calcium: 10.1 mg/dL (ref 8.7–10.2)
Chloride: 99 mmol/L (ref 96–106)
Creatinine, Ser: 1 mg/dL (ref 0.57–1.00)
Globulin, Total: 3 g/dL (ref 1.5–4.5)
Glucose: 213 mg/dL — ABNORMAL HIGH (ref 70–99)
Potassium: 5.5 mmol/L — ABNORMAL HIGH (ref 3.5–5.2)
Sodium: 139 mmol/L (ref 134–144)
Total Protein: 7 g/dL (ref 6.0–8.5)
eGFR: 66 mL/min/{1.73_m2} (ref >59)

## 2022-05-16 LAB — LIPID PANEL
Chol/HDL Ratio: 4 ratio (ref 0.0–4.4)
Cholesterol, Total: 133 mg/dL (ref 100–199)
HDL: 33 mg/dL — ABNORMAL LOW (ref >39)
LDL Chol Calc (NIH): 66 mg/dL (ref 0–99)
Triglycerides: 205 mg/dL — ABNORMAL HIGH (ref 0–149)
VLDL Cholesterol Cal: 34 mg/dL (ref 5–40)

## 2022-05-16 LAB — CBC WITH DIFFERENTIAL
Basophils Absolute: 0.2 10*3/uL (ref 0.0–0.2)
Basos: 2 %
EOS (ABSOLUTE): 0.5 10*3/uL — ABNORMAL HIGH (ref 0.0–0.4)
Eos: 4 %
Hematocrit: 39.7 % (ref 34.0–46.6)
Hemoglobin: 13 g/dL (ref 11.1–15.9)
Immature Grans (Abs): 0.1 10*3/uL (ref 0.0–0.1)
Immature Granulocytes: 1 %
Lymphocytes Absolute: 2.7 10*3/uL (ref 0.7–3.1)
Lymphs: 26 %
MCH: 29.7 pg (ref 26.6–33.0)
MCHC: 32.7 g/dL (ref 31.5–35.7)
MCV: 91 fL (ref 79–97)
Monocytes Absolute: 0.7 10*3/uL (ref 0.1–0.9)
Monocytes: 7 %
Neutrophils Absolute: 6.3 10*3/uL (ref 1.4–7.0)
Neutrophils: 60 %
RBC: 4.38 x10E6/uL (ref 3.77–5.28)
RDW: 12.5 % (ref 11.7–15.4)
WBC: 10.4 10*3/uL (ref 3.4–10.8)

## 2022-05-16 LAB — VITAMIN B12: Vitamin B-12: >2000 pg/mL — ABNORMAL HIGH (ref 232–1245)

## 2022-05-16 LAB — HEMOGLOBIN A1C
Est. average glucose Bld gHb Est-mCnc: 209 mg/dL
Hgb A1c MFr Bld: 8.9 % — ABNORMAL HIGH (ref 4.8–5.6)

## 2022-05-16 LAB — TSH: TSH: 1.63 u[IU]/mL (ref 0.450–4.500)

## 2022-05-16 LAB — VITAMIN D 25 HYDROXY (VIT D DEFICIENCY, FRACTURES): Vit D, 25-Hydroxy: 37.9 ng/mL (ref 30.0–100.0)

## 2022-05-16 NOTE — Assessment & Plan Note (Signed)
Checking labs today.  Will continue supplements as needed.  

## 2022-05-16 NOTE — Assessment & Plan Note (Signed)
Patient sees cardiology.  She is doing well with current treatment.  Will defer to them for any treatment changes.  

## 2022-05-16 NOTE — Assessment & Plan Note (Signed)
Patient sees cardiology.  She is doing well with current treatment.  Will defer to them for any treatment changes.

## 2022-05-16 NOTE — Assessment & Plan Note (Signed)
Checking labs today. Will call pt. With results  Continue current diabetes POC, as patient has been well controlled on current regimen.  Will adjust meds if needed based on labs.  

## 2022-05-16 NOTE — Assessment & Plan Note (Signed)
Patient is under the care of podiatry.  Foot is healing well.  Will defer to them for continued treatment.

## 2022-05-16 NOTE — Assessment & Plan Note (Signed)
Blood pressure well controlled with current medications.  Continue current therapy.  Will reassess at follow up.  

## 2022-05-16 NOTE — Assessment & Plan Note (Signed)
Checking labs today.  Continue current therapy for lipid control. Will modify as needed based on labwork results.  

## 2022-05-16 NOTE — Progress Notes (Signed)
Established Patient Office Visit  Subjective:  Patient ID: Nancy [name], female    DOB: 05-29-1965  Age: 57 y.o. MRN: [id number]  Chief Complaint  Patient presents with   Follow-up    3 month follow up    Patient is here today for her 3 months follow up.  She has been feeling fairly well since last appointment.   She does not have additional concerns to discuss today.  She did go to the specialist for her foot, it is much improved.   Labs are due today. She needs refills.   I have reviewed her active problem list, medication list, allergies, notes from last encounter, lab results for her appointment today.      No other concerns at this time.   Past Medical History:  Diagnosis Date   Abnormal cardiovascular stress test 09/21/2018   Formatting of this note might be different from the original. Lexiscan Myoview 09/16/2018 revealed mild anterior ischemia   Adverse effect of motion 05/10/2014   Anxiety    Arthritis    r knee   Asthma    Breast cyst 05/10/2014   CAD (coronary artery disease)    a.) LHC 06/04/2009 at [facility]; non-obstructive CAD. b.) CTA with FFR 10/08/2018: extensive mixed plaque proximal to mid LAD (51-69%); Coronary Ca score 217; FFR 0.71 dPDA, 0.86 mLAD, 0.87 dLCx.   CCF (congestive cardiac failure) (HCC) 06/06/2008   a.) 30% EF. b.) TTE 06/03/2011: EF >55%; triv MR. c.) TTE 04/27/2018: EF 55%; mild LVH; triv PR, mild MR/TR; G1DD.   Chest pain with high risk for cardiac etiology 07/02/2016   Chronic use of opiate drug for therapeutic purpose 02/13/2022   Formatting of this note might be different from the original. [facility] Pain Contract signed on 04/18/16 & updated 03/04/17; UDS done on 04/18/16.   Complication of anesthesia    Diabetic foot infection (HCC) 03/13/2015   Eczema    Family history of adverse reaction to anesthesia    a.) PONV in mother and grandmother   GERD (gastroesophageal reflux disease) 05/10/2014   History of kidney stones    HLD  (hyperlipidemia)    Hyponatremia 03/13/2015   Migraines    Motion sickness    Panic attacks    Peripheral edema 04/27/2018   Pneumonia    PONV (postoperative nausea and vomiting)    Sepsis secondary to diabetic foot infection 03/13/2015   T2DM (type 2 diabetes mellitus) (HCC)    Takotsubo cardiomyopathy / transient apical balooning syndrome / stress-induced cardiomyopathy 05/10/2014   Unspecified essential hypertension     Past Surgical History:  Procedure Laterality Date   AMPUTATION TOE Left 03/16/2015   Procedure: left fifth toe amputation with incision and drainage;  Surgeon: Gwyneth Revels, DPM;  Location: [facility];  Service: Podiatry;  Laterality: Left;   APPLICATION OF WOUND VAC Left 02/14/2021   Procedure: APPLICATION OF WOUND VAC;  Surgeon: Gwyneth Revels, DPM;  Location: [facility];  Service: Podiatry;  Laterality: Left;   APPLICATION OF WOUND VAC Left 04/16/2021   Procedure: APPLICATION OF WOUND VAC;  Surgeon: Peggye Form, DO;  Location: [facility];  Service: Plastics;  Laterality: Left;   CARDIAC CATHETERIZATION Left 06/04/2009   Procedure: CARDIAC CATHETERIZATION; Location: [facility]   CHOLECYSTECTOMY     DEBRIDEMENT AND CLOSURE WOUND Left 04/16/2021   Procedure: DEBRIDEMENT AND CLOSURE WOUND;  Surgeon: Peggye Form, DO;  Location: [facility];  Service: Plastics;  Laterality: Left;  1  hour   IRRIGATION AND DEBRIDEMENT FOOT Left 02/08/2021   Procedure: IRRIGATION AND DEBRIDEMENT FOOT - LFT HEEL ULCER;  Surgeon: Gwyneth Revels, DPM;  Location: [facility];  Service: Podiatry;  Laterality: Left;   IRRIGATION AND DEBRIDEMENT FOOT Left 02/14/2021   Procedure: IRRIGATION AND DEBRIDEMENT LEFT HEEL;  Surgeon: Gwyneth Revels, DPM;  Location: [facility];  Service: Podiatry;  Laterality: Left;   kidney stone removal     KNEE ARTHROSCOPY W/ MENISCAL REPAIR     NOSE SURGERY  01/07/1989   due to fracture   TOE AMPUTATION     second toe    TONSILLECTOMY     TOTAL KNEE ARTHROPLASTY  01/11/2011   Procedure: TOTAL KNEE ARTHROPLASTY;  Surgeon: Harvie Junior;  Location: [facility];  Service: Orthopedics;  Laterality: Right;  COMPUTER ASSISTED TOTAL KNEE REPLACEMENT    Social History   Socioeconomic History   Marital status: Married    Spouse name: Not on file   Number of children: Not on file   Years of education: Not on file   Highest education level: Not on file  Occupational History   Not on file  Tobacco Use   Smoking status: Former    Packs/day: 0.00    Years: 30.00    Additional pack years: 0.00    Total pack years: 0.00    Types: Cigarettes    Quit date: 03/13/2015    Years since quitting: 7.1   Smokeless tobacco: Never  Vaping Use   Vaping Use: Never used  Substance and Sexual Activity   Alcohol use: No   Drug use: No   Sexual activity: Yes    Birth control/protection: Post-menopausal  Other Topics Concern   Not on file  Social History Narrative   Full time. Does not regularly exercise. Domestic partner.    Social Determinants of Corporate investment banker Strain: Not on file  Food Insecurity: Not on file  Transportation Needs: Not on file  Physical Activity: Not on file  Stress: Not on file  Social Connections: Not on file  Intimate Partner Violence: Not on file    Family History  Problem Relation Age of Onset   Transient ischemic attack Mother    Asthma Mother    Hypertension Mother    Lung cancer Father    Hypertension Father    Asthma Sister    Anxiety disorder Sister    Stomach cancer Maternal Grandmother    Colon cancer Maternal Grandmother    Leukemia Maternal Grandmother    Melanoma Maternal Grandfather    Asthma Sister    Hypertension Brother    Cancer Other    Crohn's disease Other    GER disease Other    Breast cancer Neg Hx     Allergies  Allergen Reactions   Shellfish Allergy Anaphylaxis   Codeine Other (See Comments)    Migraine   Morphine And Related Other (See  Comments)    Reaction:  Severe migraines    Percocet [Oxycodone-Acetaminophen] Nausea And Vomiting   Rosuvastatin     Other reaction(s): Muscle Pain   Penicillins Rash and Other (See Comments)    Is tolerating amoxicillin so no allergies   Sulfa Antibiotics Rash   Theophyllines Itching and Rash    Review of Systems  Constitutional:  Positive for malaise/fatigue.  Neurological:  Positive for tingling.  All other systems reviewed and are negative.      Objective:   BP 132/80   Pulse 65   Ht 5'  9" (1.753 m)   Wt 278 lb 6.4 oz (126.3 kg)   LMP 02/08/2013 (Within Months)   SpO2 95%   BMI 41.11 kg/m   Vitals:   05/15/22 1107  BP: 132/80  Pulse: 65  Height: 5\' 9"  (1.753 m)  Weight: 278 lb 6.4 oz (126.3 kg)  SpO2: 95%  BMI (Calculated): 41.09    Physical Exam Vitals and nursing note reviewed.  Constitutional:      Appearance: Normal appearance. She is normal weight.  HENT:     Head: Normocephalic.  Eyes:     Pupils: Pupils are equal, round, and reactive to light.  Cardiovascular:     Rate and Rhythm: Normal rate.  Pulmonary:     Effort: Pulmonary effort is normal.     Breath sounds: Normal breath sounds.  Feet:     Left foot:     Skin integrity: Ulcer (on left heel, is under care for this. Area is healing well.) present.  Neurological:     General: No focal deficit present.     Mental Status: She is alert and oriented to person, place, and time.     Gait: Gait abnormal.  Psychiatric:        Mood and Affect: Mood normal.        Behavior: Behavior normal.        Thought Content: Thought content normal.        Judgment: Judgment normal.      Results for orders placed or performed in visit on 05/15/22  Vitamin B12  Result Value Ref Range   Vitamin B-12 >2000 (H) 232 - 1245 pg/mL  Hemoglobin A1c  Result Value Ref Range   Hgb A1c MFr Bld 8.9 (H) 4.8 - 5.6 %   Est. average glucose Bld gHb Est-mCnc 209 mg/dL  TSH  Result Value Ref Range   TSH 1.630 0.450  - 4.500 uIU/mL  CMP14+EGFR  Result Value Ref Range   Glucose 213 (H) 70 - 99 mg/dL   BUN 24 6 - 24 mg/dL   Creatinine, Ser 1.47 0.57 - 1.00 mg/dL   eGFR 66 >82 NF/AOZ/3.08   BUN/Creatinine Ratio 24 (H) 9 - 23   Sodium 139 134 - 144 mmol/L   Potassium 5.5 (H) 3.5 - 5.2 mmol/L   Chloride 99 96 - 106 mmol/L   CO2 24 20 - 29 mmol/L   Calcium 10.1 8.7 - 10.2 mg/dL   Total Protein 7.0 6.0 - 8.5 g/dL   Albumin 4.0 3.8 - 4.9 g/dL   Globulin, Total 3.0 1.5 - 4.5 g/dL   Albumin/Globulin Ratio 1.3 1.2 - 2.2   Bilirubin Total 0.2 0.0 - 1.2 mg/dL   Alkaline Phosphatase 50 44 - 121 IU/L   AST 20 0 - 40 IU/L   ALT 24 0 - 32 IU/L  CBC With Differential  Result Value Ref Range   WBC 10.4 3.4 - 10.8 x10E3/uL   RBC 4.38 3.77 - 5.28 x10E6/uL   Hemoglobin 13.0 11.1 - 15.9 g/dL   Hematocrit 65.7 84.6 - 46.6 %   MCV 91 79 - 97 fL   MCH 29.7 26.6 - 33.0 pg   MCHC 32.7 31.5 - 35.7 g/dL   RDW 96.2 95.2 - 84.1 %   Neutrophils 60 Not Estab. %   Lymphs 26 Not Estab. %   Monocytes 7 Not Estab. %   Eos 4 Not Estab. %   Basos 2 Not Estab. %   Neutrophils Absolute 6.3 1.4 - 7.0 x10E3/uL  Lymphocytes Absolute 2.7 0.7 - 3.1 x10E3/uL   Monocytes Absolute 0.7 0.1 - 0.9 x10E3/uL   EOS (ABSOLUTE) 0.5 (H) 0.0 - 0.4 x10E3/uL   Basophils Absolute 0.2 0.0 - 0.2 x10E3/uL   Immature Granulocytes 1 Not Estab. %   Immature Grans (Abs) 0.1 0.0 - 0.1 x10E3/uL  VITAMIN D 25 Hydroxy (Vit-D Deficiency, Fractures)  Result Value Ref Range   Vit D, 25-Hydroxy 37.9 30.0 - 100.0 ng/mL  Lipid panel  Result Value Ref Range   Cholesterol, Total 133 100 - 199 mg/dL   Triglycerides 086 (H) 0 - 149 mg/dL   HDL 33 (L) >57 mg/dL   VLDL Cholesterol Cal 34 5 - 40 mg/dL   LDL Chol Calc (NIH) 66 0 - 99 mg/dL   Chol/HDL Ratio 4.0 0.0 - 4.4 ratio    Recent Results (from the past 2160 hour(s))  Vitamin B12     Status: Abnormal   Collection Time: 05/15/22 12:16 PM  Result Value Ref Range   Vitamin B-12 >2000 (H) 232 - 1245  pg/mL  Hemoglobin A1c     Status: Abnormal   Collection Time: 05/15/22 12:16 PM  Result Value Ref Range   Hgb A1c MFr Bld 8.9 (H) 4.8 - 5.6 %    Comment:          Prediabetes: 5.7 - 6.4          Diabetes: >6.4          Glycemic control for adults with diabetes: <7.0    Est. average glucose Bld gHb Est-mCnc 209 mg/dL  TSH     Status: None   Collection Time: 05/15/22 12:16 PM  Result Value Ref Range   TSH 1.630 0.450 - 4.500 uIU/mL  CMP14+EGFR     Status: Abnormal   Collection Time: 05/15/22 12:16 PM  Result Value Ref Range   Glucose 213 (H) 70 - 99 mg/dL   BUN 24 6 - 24 mg/dL   Creatinine, Ser 8.46 0.57 - 1.00 mg/dL   eGFR 66 >96 EX/BMW/4.13   BUN/Creatinine Ratio 24 (H) 9 - 23   Sodium 139 134 - 144 mmol/L   Potassium 5.5 (H) 3.5 - 5.2 mmol/L   Chloride 99 96 - 106 mmol/L   CO2 24 20 - 29 mmol/L   Calcium 10.1 8.7 - 10.2 mg/dL   Total Protein 7.0 6.0 - 8.5 g/dL   Albumin 4.0 3.8 - 4.9 g/dL   Globulin, Total 3.0 1.5 - 4.5 g/dL   Albumin/Globulin Ratio 1.3 1.2 - 2.2   Bilirubin Total 0.2 0.0 - 1.2 mg/dL   Alkaline Phosphatase 50 44 - 121 IU/L   AST 20 0 - 40 IU/L   ALT 24 0 - 32 IU/L  CBC With Differential     Status: Abnormal   Collection Time: 05/15/22 12:16 PM  Result Value Ref Range   WBC 10.4 3.4 - 10.8 x10E3/uL   RBC 4.38 3.77 - 5.28 x10E6/uL   Hemoglobin 13.0 11.1 - 15.9 g/dL   Hematocrit 24.4 01.0 - 46.6 %   MCV 91 79 - 97 fL   MCH 29.7 26.6 - 33.0 pg   MCHC 32.7 31.5 - 35.7 g/dL   RDW 27.2 53.6 - 64.4 %   Neutrophils 60 Not Estab. %   Lymphs 26 Not Estab. %   Monocytes 7 Not Estab. %   Eos 4 Not Estab. %   Basos 2 Not Estab. %   Neutrophils Absolute 6.3 1.4 - 7.0 x10E3/uL   Lymphocytes  Absolute 2.7 0.7 - 3.1 x10E3/uL   Monocytes Absolute 0.7 0.1 - 0.9 x10E3/uL   EOS (ABSOLUTE) 0.5 (H) 0.0 - 0.4 x10E3/uL   Basophils Absolute 0.2 0.0 - 0.2 x10E3/uL   Immature Granulocytes 1 Not Estab. %   Immature Grans (Abs) 0.1 0.0 - 0.1 x10E3/uL  VITAMIN D 25 Hydroxy  (Vit-D Deficiency, Fractures)     Status: None   Collection Time: 05/15/22 12:16 PM  Result Value Ref Range   Vit D, 25-Hydroxy 37.9 30.0 - 100.0 ng/mL    Comment: Vitamin D deficiency has been defined by the Institute of Medicine and an Endocrine Society practice guideline as a level of serum 25-OH vitamin D less than 20 ng/mL (1,2). The Endocrine Society went on to further define vitamin D insufficiency as a level between 21 and 29 ng/mL (2). 1. IOM (Institute of Medicine). 2010. Dietary reference    intakes for calcium and D. [place]: The    [employer]. 2. Holick MF, Binkley [place], Bischoff-Ferrari HA, et al.    Evaluation, treatment, and prevention of vitamin D    deficiency: an Endocrine Society clinical practice    guideline. JCEM. 2011 Jul; 96(7):1911-30.   Lipid panel     Status: Abnormal   Collection Time: 05/15/22 12:16 PM  Result Value Ref Range   Cholesterol, Total 133 100 - 199 mg/dL   Triglycerides 308 (H) 0 - 149 mg/dL   HDL 33 (L) >65 mg/dL   VLDL Cholesterol Cal 34 5 - 40 mg/dL   LDL Chol Calc (NIH) 66 0 - 99 mg/dL   Chol/HDL Ratio 4.0 0.0 - 4.4 ratio    Comment:                                   T. Chol/HDL Ratio                                             Men  Women                               1/2 Avg.Risk  3.4    3.3                                   Avg.Risk  5.0    4.4                                2X Avg.Risk  9.6    7.1                                3X Avg.Risk 23.4   11.0        Assessment & Plan:   Problem List Items Addressed This Visit       Active Problems   Essential hypertension - Primary    Blood pressure well controlled with current medications.  Continue current therapy.  Will reassess at follow up.        Relevant Orders   CBC With Differential   CMP14+EGFR   Atherosclerosis of native coronary artery  of native heart with angina pectoris [facility])    Patient sees cardiology.  She is doing well with current  treatment.  Will defer to them for any treatment changes.       Heart failure [facility])    Patient sees cardiology.  She is doing well with current treatment.  Will defer to them for any treatment changes.       Neuropathy, diabetic (HCC)    Checking labs today. Will call pt. With results  Continue current diabetes POC, as patient has been well controlled on current regimen.  Will adjust meds if needed based on labs.       Mixed hyperlipidemia    Checking labs today.  Continue current therapy for lipid control. Will modify as needed based on labwork results.        Relevant Orders   Lipid panel   CBC With Differential   CMP14+EGFR   Diabetes mellitus, type 2 (HCC)    Checking labs today. Will call pt. With results  Continue current diabetes POC, as patient has been well controlled on current regimen.  Will adjust meds if needed based on labs.       Relevant Orders   CBC With Differential   CMP14+EGFR   Hemoglobin A1c   Diabetic foot ulcer (HCC)    Checking labs today. Will call pt. With results  Continue current diabetes POC, as patient has been well controlled on current regimen.  Will adjust meds if needed based on labs.        Other acute osteomyelitis, left ankle and foot (HCC)    Patient is under the care of podiatry.  Foot is healing well.  Will defer to them for continued treatment.       B12 deficiency due to diet    Checking labs today.  Will continue supplements as needed.        Relevant Orders   CBC With Differential   CMP14+EGFR   Vitamin B12   Vitamin D deficiency, unspecified    Checking labs today.  Will continue supplements as needed.        Relevant Orders   VITAMIN D 25 Hydroxy (Vit-D Deficiency, Fractures)   CBC With Differential   CMP14+EGFR   Other Visit Diagnoses     Other fatigue       Relevant Orders   CBC With Differential   CMP14+EGFR   TSH       Return in about 3 months (around 08/15/2022) for F/U.   Total  time spent: 30 minutes  Miki Kins, FNP  05/15/2022

## 2022-05-20 ENCOUNTER — Telehealth: Payer: Self-pay

## 2022-05-20 NOTE — Telephone Encounter (Signed)
Pt called and left vm regarding her & deborah's rx trulicity, said the copay went up to $400 for both of them to get rx. Said she doesn't know where they should go from here with this? Please advise

## 2022-05-24 ENCOUNTER — Other Ambulatory Visit: Payer: Self-pay | Admitting: Family

## 2022-05-29 ENCOUNTER — Telehealth: Payer: Self-pay | Admitting: Physician Assistant

## 2022-05-29 NOTE — Telephone Encounter (Signed)
I was able to speak with her, she is doing fantastic, the donated myriad powder has made a significant difference.  She has a follow-up visit with me in a week and a half she will make that visit.  She will call if she needs anything in the meantime.

## 2022-05-29 NOTE — Telephone Encounter (Signed)
Needs PA to give her a call.  Current dressing has came off and needs to know if she is to do collagen dressing or do wet to dry.  She did want you to know that the wound looks good.

## 2022-06-10 ENCOUNTER — Ambulatory Visit: Payer: 59 | Admitting: Physician Assistant

## 2022-06-10 ENCOUNTER — Encounter: Payer: Self-pay | Admitting: Physician Assistant

## 2022-06-10 VITALS — BP 181/77 | HR 77

## 2022-06-10 DIAGNOSIS — L97424 Non-pressure chronic ulcer of left heel and midfoot with necrosis of bone: Secondary | ICD-10-CM | POA: Diagnosis not present

## 2022-06-10 DIAGNOSIS — E11621 Type 2 diabetes mellitus with foot ulcer: Secondary | ICD-10-CM

## 2022-06-10 NOTE — Progress Notes (Signed)
   Referring Provider Miki Kins, FNP [address] [place],  [place] [address]   CC: No chief complaint on file.     Nancy [name] is an 57 y.o. female.    HPI: This is a 57 year old female seen in our office for follow-up evaluation of her left heel.  To recap, she was originally seen in March 2023 by orthopedics for a blister to her left heel.  She underwent debridement and unfortunately the wound enlarged.  The wound reached approximately 5 x 9 cm, wound VAC was started, her Achilles tendon was exposed at some point and was resected.  Dr. Ulice Bold took her to the operating room in April and performed an excision of the wound with placement of myriad and a VAC.  She was seen back in the office in May and June for follow-up evaluations.  She was recently seen in the office on 01/29/2022, at that time the wound had decreased in size significantly.  She had been using Vashe and Hydrofera Blue and is being followed by podiatrist.  At that visit the wound measured approximately 1.5 x 2 cm.  She then followed up again on 03/15/2022.  At that time she had noted some improvement in her wound.  She had been using collagen sheets and continues to be seen by podiatry as well.  At her last visit on 05/29/22 I placed donated Myriad on her wound.   Since her last visit she notes that she has been doing well, she feels like the wound is improving.  She denies any infectious signs or symptoms.  Review of Systems General: positive for heel wound, negative for fever  Physical Exam    05/15/2022   11:07 AM 02/22/2022    9:56 AM 02/13/2022    1:16 PM  Vitals with BMI  Height 5\' 9"  5\' 9"  5\' 9"   Weight 278 lbs 6 oz 278 lbs 274 lbs 3 oz  BMI 41.09 41.03 40.47  Systolic 132 134 604  Diastolic 80 78 82  Pulse 65 77 79    General:  No acute distress,  Alert and oriented, Non-Toxic, Normal speech and affect There are 2 areas along the left heel with a fibrinous band separating them, the inferior wound  measures 1.25 x 0.75x 0.25 cm, the superior wound measures 1.5 x 2 x 0.5 cm.  No surrounding redness, minimal serous exudate  Assessment/Plan  This is a 57 year old female seen in our office for follow-up evaluation of left heel wound.  The inferior wound does appear to be improving with more granulation tissue.  The superior wound does appear slightly larger than at her previous visit.  I debrided the area of any dead tissue and placed donated myriad on this.  They will continue to monitor the area applying K-Y jelly over the top daily for the next several weeks.  They have a follow-up appointment with podiatry in 3 weeks.  I would like the patient to be seen back in our office in approximately 5 weeks for repeat evaluation.  She understands that if the wound progresses any further she will need to reach out to our office for a sooner visit.  She was given strict return precautions.  She verbalized understanding and agreement to today's plan.  Nancy [name] Nancy [name] 06/10/2022, 8:51 AM

## 2022-06-24 ENCOUNTER — Other Ambulatory Visit: Payer: Self-pay | Admitting: Family

## 2022-06-27 ENCOUNTER — Telehealth: Payer: Self-pay | Admitting: Physician Assistant

## 2022-06-27 NOTE — Telephone Encounter (Signed)
Pt. Called and wants to know if she should keep her appt with the Podiatrist on Mon, 07/01/22 since they will want to remove the bandages.  She did not want to pay the $100 copay if you would prefer the Podiatrist not touch it.  Please call her at [phone]

## 2022-06-28 NOTE — Telephone Encounter (Signed)
I am not sure, I have not seen patient in a year. Please check with Trey Paula

## 2022-06-28 NOTE — Telephone Encounter (Signed)
Yes she needs to keep that appointment, its ok for them to remove the bandages and treat as needed.

## 2022-07-01 DIAGNOSIS — L97509 Non-pressure chronic ulcer of other part of unspecified foot with unspecified severity: Secondary | ICD-10-CM | POA: Diagnosis not present

## 2022-07-01 DIAGNOSIS — L97421 Non-pressure chronic ulcer of left heel and midfoot limited to breakdown of skin: Secondary | ICD-10-CM | POA: Diagnosis not present

## 2022-07-01 DIAGNOSIS — L97423 Non-pressure chronic ulcer of left heel and midfoot with necrosis of muscle: Secondary | ICD-10-CM | POA: Diagnosis not present

## 2022-07-01 DIAGNOSIS — E11621 Type 2 diabetes mellitus with foot ulcer: Secondary | ICD-10-CM | POA: Diagnosis not present

## 2022-07-08 ENCOUNTER — Telehealth: Payer: Self-pay

## 2022-07-08 NOTE — Telephone Encounter (Signed)
Patient is requesting rx for fluconazole and for scolpamine patches  to go to [facility] in [place]

## 2022-07-16 ENCOUNTER — Ambulatory Visit: Payer: 59 | Admitting: Plastic Surgery

## 2022-07-16 ENCOUNTER — Encounter: Payer: Self-pay | Admitting: Plastic Surgery

## 2022-07-16 VITALS — BP 196/72 | HR 75 | Ht 69.0 in | Wt 275.0 lb

## 2022-07-16 DIAGNOSIS — L97424 Non-pressure chronic ulcer of left heel and midfoot with necrosis of bone: Secondary | ICD-10-CM

## 2022-07-16 DIAGNOSIS — E11621 Type 2 diabetes mellitus with foot ulcer: Secondary | ICD-10-CM | POA: Diagnosis not present

## 2022-07-16 NOTE — Progress Notes (Signed)
   Subjective:    Patient ID: Nancy [name], female    DOB: 1965-07-29, 57 y.o.   MRN: [id number]  The patient is a 57 year old female here for follow-up on her left heel wound.  She started off with a blister underwent debridement.  The area got out of control and became a 5 x 9 cm wound.  She had the VAC at 1 point.  Her Achilles tendon was exposed and some of it was resected.  She is 5 feet 9 inches tall.  She went to the operating room in April for debridement and placement of myriad.  Seeing her in the office for follow-up. She is using KY gel at home and Vashe.  The wound is approximately 2 x 5 cm on the posterior aspect of her left foot ankle area.      Review of Systems  Constitutional: Negative.   Eyes: Negative.   Respiratory: Negative.    Cardiovascular: Negative.   Gastrointestinal: Negative.   Endocrine: Negative.   Genitourinary: Negative.        Objective:   Physical Exam Constitutional:      Appearance: Normal appearance.  Cardiovascular:     Rate and Rhythm: Normal rate.     Pulses: Normal pulses.  Musculoskeletal:        General: Tenderness present.  Skin:    General: Skin is warm.     Capillary Refill: Capillary refill takes less than 2 seconds.     Findings: Lesion present.  Neurological:     Mental Status: She is alert and oriented to person, place, and time.  Psychiatric:        Mood and Affect: Mood normal.        Behavior: Behavior normal.        Thought Content: Thought content normal.        Assessment & Plan:     ICD-10-CM   1. 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  I would like to see her back in 3 weeks but due to her schedule it will be 4 weeks.  At that time we will have to decide on whether or not to do more surgery.

## 2022-07-17 ENCOUNTER — Telehealth: Payer: Self-pay | Admitting: *Deleted

## 2022-07-17 NOTE — Telephone Encounter (Signed)
Faxed order to [facility].   Order:Kerlix:(4x4)           Gauze:(4x4)            Adaptic

## 2022-07-18 ENCOUNTER — Telehealth: Payer: Self-pay | Admitting: Family

## 2022-07-18 NOTE — Telephone Encounter (Signed)
Patient left VM needing Rx of fluconazole [facility] and a Rx for motion sickness patches. Please advise.

## 2022-07-19 ENCOUNTER — Telehealth: Payer: Self-pay | Admitting: *Deleted

## 2022-07-19 ENCOUNTER — Other Ambulatory Visit: Payer: Self-pay

## 2022-07-19 MED ORDER — SCOPOLAMINE 1 MG/3DAYS TD PT72: 1.0000 | MEDICATED_PATCH | TRANSDERMAL | 3 refills | Status: DC

## 2022-07-19 MED ORDER — FLUCONAZOLE 100 MG PO TABS: ORAL_TABLET | ORAL | 1 refills | Status: DC

## 2022-07-19 NOTE — Telephone Encounter (Signed)
Received on (07/18/22) via of fax Order Status Notification from [facility].  ATTENTION:there are some issues delaying service to your patient.  [facility] will be contacting the patient with pricing options.  Their deductible has not yes been satisfied.  Thank you for your patient!//AB/CMA

## 2022-07-25 ENCOUNTER — Telehealth: Payer: Self-pay | Admitting: Plastic Surgery

## 2022-07-25 NOTE — Telephone Encounter (Signed)
Pt called.  Dr. Ulice Bold put in order to [facility] for supplies for her wound.  [facility] called one day last week and asked for [employer], but Ms. Aull told them it was not [employer].  The person from [employer] said they would check on it and call you back but she has not heard anything yet and it has been a week.  Please call her to tell her if [facility] is going to be sending supplies or give her a list so that she can get what she needs.  Please call her at [phone]

## 2022-07-27 ENCOUNTER — Other Ambulatory Visit: Payer: Self-pay | Admitting: *Deleted

## 2022-08-01 NOTE — Telephone Encounter (Signed)
Patient called to follow up on this, She said someone had talked to her over this past weekend and said that someone was supposed to call her back Monday but she still didn't hear anything. She still hasn't received anything. She is requesting a call back to let her know what she needs to do. Please advise. Call back is [phone]

## 2022-08-05 ENCOUNTER — Ambulatory Visit: Payer: 59 | Admitting: Student

## 2022-08-05 ENCOUNTER — Telehealth: Payer: Self-pay | Admitting: *Deleted

## 2022-08-05 VITALS — BP 160/75 | HR 73

## 2022-08-05 DIAGNOSIS — E11621 Type 2 diabetes mellitus with foot ulcer: Secondary | ICD-10-CM | POA: Diagnosis not present

## 2022-08-05 DIAGNOSIS — L97424 Non-pressure chronic ulcer of left heel and midfoot with necrosis of bone: Secondary | ICD-10-CM | POA: Diagnosis not present

## 2022-08-05 NOTE — Progress Notes (Signed)
Referring Provider Miki Kins, FNP [address] [place],  [place] [address]   CC:  Chief Complaint  Patient presents with   Follow-up      Nancy [name] is an 57 y.o. female.  HPI: This is a 57 year old female seen in our office for follow-up evaluation of her left heel.  To recap, she was originally seen in March 2023 by orthopedics for a blister to her left heel.  She underwent debridement and unfortunately the wound enlarged.  The wound reached approximately 5 x 9 cm, wound VAC was started, her Achilles tendon was exposed at some point and was resected.  Dr. Ulice Bold took her to the operating room in April and performed an excision of the wound with placement of myriad and a VAC.  She was seen back in the office in May and June for follow-up evaluations.  She was then seen in the office on 01/29/2022, at that time the wound had decreased in size significantly.  She has been following up with her clinic for continued management of her wound.  Patient was last seen in the clinic on 07/16/2022.  At this visit, patient reported she had been using K-Y jelly at home and Vashe to the wound.  The wound was approximately 2 x 5 cm on the posterior aspect of her left foot/ankle area.  Plan was for patient to use endoform on the wound for a few weeks and to come back to the clinic in 4 weeks.  It was discussed at that time it have to be decided if patient would undergo surgery or not.  Today, patient reports she is doing well.  She states that her wound has been draining quite a bit, but no changes since previous visit.  Patient states that she was unable to get supplies that were ordered last time because her insurance did not cover.  Patient and her partner states that they have been applying collagen to the wound and a small amount of K-Y jelly daily.  Patient reports that she has also been seeing podiatry.  Patient states she also has an appointment with podiatry in 2 weeks.   Review of  Systems General: No changes in health since previous visit  Physical Exam    08/05/2022   10:18 AM 07/16/2022    8:59 AM 06/10/2022    9:27 AM  Vitals with BMI  Height  5\' 9"    Weight  275 lbs   BMI  40.59   Systolic 160 196 010  Diastolic 75 72 77  Pulse 73 75 77    General:  No acute distress,  Alert and oriented, Non-Toxic, Normal speech and affect On exam, patient is sitting upright in no acute distress.  Wound to the left heel is approximately 4 cm x 1.5 cm x 0.5 cm.  Wound edges appear to be macerated.  There does appear to be some healthy tissue noted throughout the wound, but at the inferior aspect of the wound there is some fibrinous exudate.  Some of this was debrided.  Patient tolerated well.  There is no surrounding erythema.  There is also some dry appearing skin noted surrounding the wound.  No signs of infection on exam.  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  I would also like them to continue Vashe cleanses daily.  They may then wrap with Kerlix and Ace wrap.  Also discussed that they may apply some Vaseline to the dry skin around the wound.  Patient and patient's partner expressed understanding.  Discussed with them that I will discuss today's visit with Dr. Ulice Bold and talk about potential return to the operating room for debridement and possible placement of wound matrix or other interventions.   Will plan to see the patient back in 3 weeks.  I instructed the patient to call in the meantime if she has any questions or concerns about anything.  Pictures were obtained of the patient and placed in the chart with the patient's or guardian's permission.   Laurena Spies 08/05/2022, 10:25 AM

## 2022-08-05 NOTE — Telephone Encounter (Signed)
  Nancy [name], I talked to [name] and she states she spoke to the [name] on Saturday to let her know she has not met her deductible and will have to pay out of pocket until then. Dr. Algis Downs has her continuing wet to dry dressings at this time

## 2022-08-05 NOTE — Telephone Encounter (Signed)
Hello, would someone mind checking on supplies order for this patient? I am unsure what the call from last weekend was about, there is not a note in the chart.  Thanks, Alan Ripper

## 2022-08-05 NOTE — Telephone Encounter (Addendum)
Patient was seen in the office today by Claire,PA-C.  New [facility] order was given and faxed to [facility].  Confirmation received and copy scanned into the chart.  Called [facility] and asked them to cancel the old order.  New order faxed:Calcium Alginate Dressing-Daily                             Bulky Roll Gauze-Daily                              Telfa-Daily  Confirmation received and copy scanned into the chart.//AB/CMA

## 2022-08-05 NOTE — Telephone Encounter (Signed)
  Per Claire,PA-C-Thank you

## 2022-08-07 ENCOUNTER — Other Ambulatory Visit: Payer: 59

## 2022-08-07 ENCOUNTER — Other Ambulatory Visit: Payer: Self-pay

## 2022-08-07 DIAGNOSIS — E538 Deficiency of other specified B group vitamins: Secondary | ICD-10-CM

## 2022-08-07 DIAGNOSIS — I10 Essential (primary) hypertension: Secondary | ICD-10-CM

## 2022-08-07 DIAGNOSIS — E559 Vitamin D deficiency, unspecified: Secondary | ICD-10-CM | POA: Diagnosis not present

## 2022-08-07 DIAGNOSIS — E1100 Type 2 diabetes mellitus with hyperosmolarity without nonketotic hyperglycemic-hyperosmolar coma (NKHHC): Secondary | ICD-10-CM | POA: Diagnosis not present

## 2022-08-07 DIAGNOSIS — R5383 Other fatigue: Secondary | ICD-10-CM | POA: Diagnosis not present

## 2022-08-07 DIAGNOSIS — E782 Mixed hyperlipidemia: Secondary | ICD-10-CM

## 2022-08-12 ENCOUNTER — Telehealth: Payer: Self-pay | Admitting: *Deleted

## 2022-08-12 NOTE — Telephone Encounter (Signed)
Called and spoke with the patient on (08/07/22) and informed her of the message from [facility] regarding the wound supply order.    Asked the patient if she has spoken with [facility] regarding supplies, and she stated that she has not spoken to [facility].  Asked the patient to call [employer] and speak with them regarding pricing options.  Patient verbalized understanding and agreed to give [employer] a call.//AB/CMA

## 2022-08-12 NOTE — Telephone Encounter (Signed)
Received on (08/06/22) via of fax Order Status Notification from [facility] stating:there are some issues delaying service to your patient.  [employer] will be contacting the patient with pricing options.  Their deductible has not yet been satisfied.  Thank you for your patient!

## 2022-08-19 ENCOUNTER — Other Ambulatory Visit: Payer: Self-pay | Admitting: Family

## 2022-08-19 DIAGNOSIS — F411 Generalized anxiety disorder: Secondary | ICD-10-CM

## 2022-08-22 ENCOUNTER — Other Ambulatory Visit: Payer: Self-pay | Admitting: Family

## 2022-08-22 ENCOUNTER — Telehealth: Payer: Self-pay | Admitting: Family

## 2022-08-22 DIAGNOSIS — E782 Mixed hyperlipidemia: Secondary | ICD-10-CM

## 2022-08-22 NOTE — Telephone Encounter (Signed)
Patient requesting her lab results since she had to reschedule her follow up. Please advise.

## 2022-08-23 ENCOUNTER — Ambulatory Visit: Payer: 59 | Admitting: Family

## 2022-08-26 ENCOUNTER — Ambulatory Visit: Payer: 59 | Admitting: Student

## 2022-08-26 NOTE — Progress Notes (Deleted)
   Referring Provider Miki Kins, FNP [address] [place],  [place] [address]   CC: No chief complaint on file.     Nancy [name] is an 57 y.o. female.  HPI: This is a 57 year old female seen in our office for follow-up evaluation of her left heel.  To recap, she was originally seen in March 2023 by orthopedics for a blister to her left heel.  She underwent debridement and unfortunately the wound enlarged.  The wound reached approximately 5 x 9 cm, wound VAC was started, her Achilles tendon was exposed at some point and was resected.  Dr. Ulice Bold took her to the operating room in April and performed an excision of the wound with placement of myriad and a VAC.  She was seen back in the office in May and June for follow-up evaluations.  She was then seen in the office on 01/29/2022, at that time the wound had decreased in size significantly.  She has been following up with her clinic for continued management of her wound.     Review of Systems General: ***  Physical Exam    08/05/2022   10:18 AM 07/16/2022    8:59 AM 06/10/2022    9:27 AM  Vitals with BMI  Height  5\' 9"    Weight  275 lbs   BMI  40.59   Systolic 160 196 962  Diastolic 75 72 77  Pulse 73 75 77    General:  No acute distress,  Alert and oriented, Non-Toxic, Normal speech and affect ***   Assessment/Plan ***  Nancy [name] 08/26/2022, 9:07 AM

## 2022-08-28 ENCOUNTER — Ambulatory Visit (INDEPENDENT_AMBULATORY_CARE_PROVIDER_SITE_OTHER): Payer: 59 | Admitting: Internal Medicine

## 2022-08-28 DIAGNOSIS — R300 Dysuria: Secondary | ICD-10-CM

## 2022-08-28 LAB — POCT URINALYSIS DIPSTICK
Bilirubin, UA: NEGATIVE
Blood, UA: POSITIVE
Glucose, UA: POSITIVE — AB
Ketones, UA: NEGATIVE
Leukocytes, UA: NEGATIVE
Nitrite, UA: POSITIVE
Protein, UA: POSITIVE — AB
Spec Grav, UA: >=1.03 — AB (ref 1.010–1.025)
Urobilinogen, UA: 0.2 E.U./dL
pH, UA: 6 (ref 5.0–8.0)

## 2022-08-29 ENCOUNTER — Ambulatory Visit: Payer: 59 | Admitting: Student

## 2022-08-29 ENCOUNTER — Encounter: Payer: Self-pay | Admitting: Internal Medicine

## 2022-08-29 ENCOUNTER — Telehealth: Payer: Self-pay

## 2022-08-29 ENCOUNTER — Other Ambulatory Visit: Payer: Self-pay | Admitting: Internal Medicine

## 2022-08-29 DIAGNOSIS — R300 Dysuria: Secondary | ICD-10-CM

## 2022-08-29 MED ORDER — CIPROFLOXACIN HCL 500 MG PO TABS: 500.0000 mg | ORAL_TABLET | Freq: Two times a day (BID) | ORAL | 0 refills | Status: AC

## 2022-08-29 NOTE — Telephone Encounter (Signed)
Pt called requesting an updated on her UA results. Voicemail was left informed pt that ABT was sent in and if pt had any other questions to reach out.

## 2022-08-29 NOTE — Progress Notes (Signed)
Patient having symptoms suggestive of UTI. Urine dipstick positive for nitrites- po Cipro sent. Repeat u/a in 1 week-

## 2022-09-02 NOTE — Progress Notes (Signed)
Patient notified

## 2022-09-03 ENCOUNTER — Encounter: Payer: Self-pay | Admitting: Physician Assistant

## 2022-09-03 ENCOUNTER — Ambulatory Visit: Payer: 59 | Admitting: Physician Assistant

## 2022-09-03 VITALS — BP 165/83 | HR 71 | Ht 69.0 in | Wt 280.0 lb

## 2022-09-03 DIAGNOSIS — L97424 Non-pressure chronic ulcer of left heel and midfoot with necrosis of bone: Secondary | ICD-10-CM

## 2022-09-03 DIAGNOSIS — E11621 Type 2 diabetes mellitus with foot ulcer: Secondary | ICD-10-CM | POA: Diagnosis not present

## 2022-09-03 NOTE — Progress Notes (Signed)
Referring Provider Miki Kins, FNP [address] [place],  [place] [address]   CC:  Chief Complaint  Patient presents with   Wound Check   Follow-up      Nancy [name] is an 57 y.o. female.  HPI: Patient is a pleasant 57 year old female who presents to clinic for a left heel wound follow-up.  In short, she presented to our clinic 03/2021 for a 5 x 9 cm over left heel.  It had been previously debrided with some resection of Nancy bone and Achilles tendon.  Myriad wound VAC was placed by Dr. Ulice Bold 04/16/2021.  She has continued to see both our service as well as podiatry since then.  Patient has been cleaning the wound with Vashe and placing Hydrofera Blue and other collagen products.  She was last seen here in clinic on 08/05/2022.  At that time, she reports that despite wound care at home, including use of endoform most recently, she has stalled with Nancy progress and had inquired about possible surgical intervention.  This was discussed as a possibility by Dr. Ulice Bold on 07/16/2022 depending on how she responded to endoform.  Wound measured 4 x 1.5 x 0.5 cm, wound edges appeared macerated.  Fibrinous exudate noted on inferior aspect of wound.  Gently debrided at bedside.  Follow-up in 3 weeks.  Today, patient is doing well.  She accompanied by Nancy [name] at bedside.  She states that she has peripheral neuropathy and does not have much sensation in Nancy foot.  She rarely experiences any discomfort or soreness.  If anything, slight burning sensation, consistent with neuropathy.  As for Nancy wound, Nancy [name] has been performing wound care regularly with continued endoform.  She feels as though the wound has gotten a bit better and with improved moisture balance compared to the encounter in July.  She states that given the possibility for surgical intervention on our end, she has paused Nancy visits with Nancy podiatrist.  She is understandably frustrated with the chronicity of Nancy wound  healing.  Patient reports drainage on dressings at times of dressing changes every 2 to 3 days.  They have continued to use Vashe periodically.    Allergies  Allergen Reactions   Shellfish Allergy Anaphylaxis   Codeine Other (See Comments)    Migraine   Morphine And Codeine Other (See Comments)    Reaction:  Severe migraines    Percocet [Oxycodone-Acetaminophen] Nausea And Vomiting   Rosuvastatin     Other reaction(s): Muscle Pain   Penicillins Rash and Other (See Comments)    Is tolerating amoxicillin so no allergies   Sulfa Antibiotics Rash   Theophyllines Itching and Rash    Outpatient Encounter Medications as of 09/03/2022  Medication Sig Note   acidophilus (RISAQUAD) CAPS capsule Take 1 capsule by mouth daily.    albuterol (PROVENTIL HFA;VENTOLIN HFA) 108 (90 Base) MCG/ACT inhaler Inhale 2 puffs into the lungs every 4 (four) hours as needed for wheezing or shortness of breath. Reported on 03/29/2015    albuterol (PROVENTIL) (2.5 MG/3ML) 0.083% nebulizer solution Take 2.5 mg by nebulization as needed.    ALPRAZolam (XANAX) 0.5 MG tablet TAKE 1/2 TO 1 TABLET BY MOUTH AS NEEDED FOR PANIC ATTACKS    ascorbic acid (VITAMIN C) 500 MG tablet Take 1 tablet (500 mg total) by mouth 2 (two) times daily.    aspirin EC 81 MG tablet Take 81 mg by mouth daily. 02/07/2021: Being held starting 02/04/21 for procedure   aspirin-acetaminophen-caffeine (EXCEDRIN  MIGRAINE) 250-250-65 MG tablet Take 2 tablets by mouth every 6 (six) hours as needed for migraine.    carvedilol (COREG) 6.25 MG tablet TAKE 1 TABLET BY MOUTH TWICE A DAY    ciprofloxacin (CIPRO) 500 MG tablet Take 1 tablet (500 mg total) by mouth 2 (two) times daily for 7 days.    Continuous Blood Gluc Sensor (FREESTYLE LIBRE 2 SENSOR) MISC APPLY ONE SENSOR EVERY 2 WEEKS.    desvenlafaxine (PRISTIQ) 100 MG 24 hr tablet TAKE 1 TABLET BY MOUTH EVERY DAY    Dulaglutide (TRULICITY) 3 MG/0.5ML SOPN INJECT 1 AUTO INJECTOR INTO SKIN ONCE WEEKLY.     Dulaglutide (TRULICITY) 4.5 MG/0.5ML SOPN Inject 4.5 mg as directed once a week.    fenofibrate 54 MG tablet TAKE 1 TABLET BY MOUTH EVERY DAY    fluconazole (DIFLUCAN) 100 MG tablet Take one tablet by mouth on day 1 and second tablet by mouth on day 4    ibuprofen (ADVIL,MOTRIN) 800 MG tablet Take 800 mg by mouth every 8 (eight) hours as needed (for migraines).     insulin NPH-regular Human (70-30) 100 UNIT/ML injection Inject 70 Units into the skin.    lisinopril (ZESTRIL) 5 MG tablet TAKE 1 TABLET BY MOUTH EVERY DAY    metFORMIN (GLUCOPHAGE) 500 MG tablet TAKE TWO TABLETS EACH MORNING. AND 3 TABLETS IN THE EVENING AS DIRECTED    montelukast (SINGULAIR) 10 MG tablet Take 1 tablet (10 mg total) by mouth at bedtime.    nystatin (MYCOSTATIN/NYSTOP) powder APPLY TOPICALLY TWICE DAILY AS NEEDED    ondansetron (ZOFRAN-ODT) 4 MG disintegrating tablet Take 4 mg by mouth every 8 (eight) hours as needed.    pantoprazole (PROTONIX) 40 MG tablet TAKE 1 TABLET BY MOUTH EVERY DAY    pravastatin (PRAVACHOL) 40 MG tablet TAKE 1 TABLET BY MOUTH EVERY DAY    SANTYL 250 UNIT/GM ointment Apply topically daily.    scopolamine (TRANSDERM-SCOP) 1 MG/3DAYS Place 1 patch (1.5 mg total) onto the skin every 3 (three) days.    traMADol (ULTRAM) 50 MG tablet Take 50 mg by mouth every 6 (six) hours as needed (for migraines).    zinc sulfate 220 (50 Zn) MG capsule Take 1 capsule (220 mg total) by mouth daily.    No facility-administered encounter medications on file as of 09/03/2022.     Past Medical History:  Diagnosis Date   Abnormal cardiovascular stress test 09/21/2018   Formatting of this note might be different from the original. Lexiscan Myoview 09/16/2018 revealed mild anterior ischemia   Adverse effect of motion 05/10/2014   Anxiety    Arthritis    r knee   Asthma    Breast cyst 05/10/2014   CAD (coronary artery disease)    a.) LHC 06/04/2009 at [facility]; non-obstructive CAD. b.) CTA with FFR 10/08/2018: extensive  mixed plaque proximal to mid LAD (51-69%); Coronary Ca score 217; FFR 0.71 dPDA, 0.86 mLAD, 0.87 dLCx.   CCF (congestive cardiac failure) (HCC) 06/06/2008   a.) 30% EF. b.) TTE 06/03/2011: EF >55%; triv MR. c.) TTE 04/27/2018: EF 55%; mild LVH; triv PR, mild MR/TR; G1DD.   Chest pain with high risk for cardiac etiology 07/02/2016   Chronic use of opiate drug for therapeutic purpose 02/13/2022   Formatting of this note might be different from the original. [facility] Pain Contract signed on 04/18/16 & updated 03/04/17; UDS done on 04/18/16.   Complication of anesthesia    Diabetic foot infection (HCC) 03/13/2015   Eczema  Family history of adverse reaction to anesthesia    a.) PONV in mother and grandmother   GERD (gastroesophageal reflux disease) 05/10/2014   History of kidney stones    HLD (hyperlipidemia)    Hyponatremia 03/13/2015   Migraines    Motion sickness    Panic attacks    Peripheral edema 04/27/2018   Pneumonia    PONV (postoperative nausea and vomiting)    Sepsis secondary to diabetic foot infection 03/13/2015   T2DM (type 2 diabetes mellitus) (HCC)    Takotsubo cardiomyopathy / transient apical balooning syndrome / stress-induced cardiomyopathy 05/10/2014   Unspecified essential hypertension     Past Surgical History:  Procedure Laterality Date   AMPUTATION TOE Left 03/16/2015   Procedure: left fifth toe amputation with incision and drainage;  Surgeon: Gwyneth Revels, DPM;  Location: [facility];  Service: Podiatry;  Laterality: Left;   APPLICATION OF WOUND VAC Left 02/14/2021   Procedure: APPLICATION OF WOUND VAC;  Surgeon: Gwyneth Revels, DPM;  Location: [facility];  Service: Podiatry;  Laterality: Left;   APPLICATION OF WOUND VAC Left 04/16/2021   Procedure: APPLICATION OF WOUND VAC;  Surgeon: Peggye Form, DO;  Location: [facility];  Service: Plastics;  Laterality: Left;   CARDIAC CATHETERIZATION Left 06/04/2009   Procedure: CARDIAC CATHETERIZATION;  Location[facility]   CHOLECYSTECTOMY     DEBRIDEMENT AND CLOSURE WOUND Left 04/16/2021   Procedure: DEBRIDEMENT AND CLOSURE WOUND;  Surgeon: Peggye Form, DO;  Location: [facility];  Service: Plastics;  Laterality: Left;  1 hour   IRRIGATION AND DEBRIDEMENT FOOT Left 02/08/2021   Procedure: IRRIGATION AND DEBRIDEMENT FOOT - LFT HEEL ULCER;  Surgeon: Gwyneth Revels, DPM;  Location: [facility];  Service: Podiatry;  Laterality: Left;   IRRIGATION AND DEBRIDEMENT FOOT Left 02/14/2021   Procedure: IRRIGATION AND DEBRIDEMENT LEFT HEEL;  Surgeon: Gwyneth Revels, DPM;  Location: [facility];  Service: Podiatry;  Laterality: Left;   kidney stone removal     KNEE ARTHROSCOPY W/ MENISCAL REPAIR     NOSE SURGERY  01/07/1989   due to fracture   TOE AMPUTATION     second toe   TONSILLECTOMY     TOTAL KNEE ARTHROPLASTY  01/11/2011   Procedure: TOTAL KNEE ARTHROPLASTY;  Surgeon: Harvie Junior;  Location: [facility];  Service: Orthopedics;  Laterality: Right;  COMPUTER ASSISTED TOTAL KNEE REPLACEMENT    Family History  Problem Relation Age of Onset   Transient ischemic attack Mother    Asthma Mother    Hypertension Mother    Lung cancer Father    Hypertension Father    Asthma Sister    Anxiety disorder Sister    Stomach cancer Maternal Grandmother    Colon cancer Maternal Grandmother    Leukemia Maternal Grandmother    Melanoma Maternal Grandfather    Asthma Sister    Hypertension Brother    Cancer Other    Crohn's disease Other    GER disease Other    Breast cancer Neg Hx     Social History   Social History Narrative   Full time. Does not regularly exercise. Domestic partner.      Review of Systems General: Denies fevers or chills Skin: Endorses drainage  Physical Exam    09/03/2022    9:43 AM 08/05/2022   10:18 AM 07/16/2022    8:59 AM  Vitals with BMI  Height 5\' 9"   5\' 9"   Weight 280 lbs  275 lbs  BMI 41.33  40.59  Systolic 165 160 086  Diastolic 83  75 72  Pulse 71 73 75    General:  No acute distress, nontoxic appearing  Respiratory: No increased work of breathing Neuro: Alert and oriented Psychiatric: Normal mood and affect  Skin: Wound now measures 3 x 2 x 0.5 cm, improved compared to last encounter.  Fibrinous white exudate on inferior aspect of wound.  No active drainage.  No evidence concerning for infection.  Assessment/Plan  Chronic left heel wound:  Continue with endoform dressing changes.  Emphasized the importance of Vashe wound cleansing regularly.  Picture(s) obtained of the patient and placed in the chart were with the patient's or guardian's permission.  Patient is not a candidate for STSG at this juncture, but will discuss with Dr. Ulice Bold to see if she wants to do anything surgically whether it be debridement and/or placement of wound matrix.  Continued conservative management in interim.  Follow-up in 4 weeks with PA on a day that Dr. Ulice Bold is available for consult.  Evelena Leyden PA-C 09/03/2022, 2:40 PM

## 2022-09-04 ENCOUNTER — Ambulatory Visit: Payer: 59 | Admitting: Family

## 2022-09-04 VITALS — BP 138/81 | HR 70 | Ht 69.0 in | Wt 285.4 lb

## 2022-09-04 DIAGNOSIS — I25119 Atherosclerotic heart disease of native coronary artery with unspecified angina pectoris: Secondary | ICD-10-CM | POA: Diagnosis not present

## 2022-09-04 DIAGNOSIS — E1100 Type 2 diabetes mellitus with hyperosmolarity without nonketotic hyperglycemic-hyperosmolar coma (NKHHC): Secondary | ICD-10-CM | POA: Diagnosis not present

## 2022-09-04 DIAGNOSIS — N390 Urinary tract infection, site not specified: Secondary | ICD-10-CM | POA: Diagnosis not present

## 2022-09-04 DIAGNOSIS — E669 Obesity, unspecified: Secondary | ICD-10-CM | POA: Diagnosis not present

## 2022-09-04 DIAGNOSIS — E782 Mixed hyperlipidemia: Secondary | ICD-10-CM

## 2022-09-04 DIAGNOSIS — E559 Vitamin D deficiency, unspecified: Secondary | ICD-10-CM | POA: Diagnosis not present

## 2022-09-04 DIAGNOSIS — I509 Heart failure, unspecified: Secondary | ICD-10-CM

## 2022-09-04 DIAGNOSIS — E538 Deficiency of other specified B group vitamins: Secondary | ICD-10-CM | POA: Diagnosis not present

## 2022-09-04 DIAGNOSIS — I10 Essential (primary) hypertension: Secondary | ICD-10-CM

## 2022-09-04 LAB — POCT URINALYSIS DIPSTICK
Bilirubin, UA: NEGATIVE
Blood, UA: NEGATIVE
Glucose, UA: POSITIVE — AB
Ketones, UA: POSITIVE
Protein, UA: POSITIVE — AB
Spec Grav, UA: >=1.03 — AB (ref 1.010–1.025)
Urobilinogen, UA: 1 E.U./dL
pH, UA: 6 (ref 5.0–8.0)

## 2022-09-15 ENCOUNTER — Encounter: Payer: Self-pay | Admitting: Family

## 2022-09-15 NOTE — Assessment & Plan Note (Signed)
Patient is seen by Cardiology, who manage this condition.  She is well controlled with current therapy.   Will defer to them for further changes to plan of care.

## 2022-09-15 NOTE — Assessment & Plan Note (Signed)
Checking labs today.  Continue current therapy for lipid control. Will modify as needed based on labwork results.  

## 2022-09-15 NOTE — Assessment & Plan Note (Signed)
Continue current meds.  Will adjust as needed based on results.  The patient is asked to make an attempt to improve diet and exercise patterns to aid in medical management of this problem. Addressed importance of increasing and maintaining water intake.   

## 2022-09-15 NOTE — Assessment & Plan Note (Signed)
Blood pressure well controlled with current medications.  Continue current therapy.  Will reassess at follow up.  

## 2022-09-15 NOTE — Assessment & Plan Note (Signed)
Continue B12 injections.  Will recheck labs prior to next appointment.

## 2022-09-15 NOTE — Assessment & Plan Note (Signed)
>>  ASSESSMENT AND PLAN FOR OBESITY (BMI 30-39.9) WRITTEN ON 09/15/2022  1:04 PM BY Miki Kins, FNP  Continue current meds.  Will adjust as needed based on results.  The patient is asked to make an attempt to improve diet and exercise patterns to aid in medical management of this problem. Addressed importance of increasing and maintaining water intake.

## 2022-09-15 NOTE — Progress Notes (Signed)
Established Patient Office Visit  Subjective:  Patient ID: Nancy [name], female    DOB: 09/15/1965  Age: 57 y.o. MRN: [id number]  Chief Complaint  Patient presents with   Follow-up    Patient is here today for her 3 months follow up.  She has been feeling fairly well since last appointment.   She does not have additional concerns to discuss today.  Labs were done recently, so we will discuss these in detail today. She needs refills.   I have reviewed her active problem list, medication list, allergies, notes from last encounter, lab results for her appointment today.    No other concerns at this time.   Past Medical History:  Diagnosis Date   Abnormal cardiovascular stress test 09/21/2018   Formatting of this note might be different from the original. Lexiscan Myoview 09/16/2018 revealed mild anterior ischemia   Adverse effect of motion 05/10/2014   Anxiety    Arthritis    r knee   Asthma    Breast cyst 05/10/2014   CAD (coronary artery disease)    a.) LHC 06/04/2009 at [facility]; non-obstructive CAD. b.) CTA with FFR 10/08/2018: extensive mixed plaque proximal to mid LAD (51-69%); Coronary Ca score 217; FFR 0.71 dPDA, 0.86 mLAD, 0.87 dLCx.   CCF (congestive cardiac failure) (HCC) 06/06/2008   a.) 30% EF. b.) TTE 06/03/2011: EF >55%; triv MR. c.) TTE 04/27/2018: EF 55%; mild LVH; triv PR, mild MR/TR; G1DD.   Chest pain with high risk for cardiac etiology 07/02/2016   Chronic use of opiate drug for therapeutic purpose 02/13/2022   Formatting of this note might be different from the original. [facility] Pain Contract signed on 04/18/16 & updated 03/04/17; UDS done on 04/18/16.   Complication of anesthesia    Diabetic foot infection (HCC) 03/13/2015   Diabetic foot ulcer (HCC) 02/08/2021   Eczema    Family history of adverse reaction to anesthesia    a.) PONV in mother and grandmother   GERD (gastroesophageal reflux disease) 05/10/2014   History of kidney stones    HLD  (hyperlipidemia)    Hyponatremia 03/13/2015   Migraines    Motion sickness    Other acute osteomyelitis, left ankle and foot (HCC) 02/13/2022   Panic attacks    Peripheral edema 04/27/2018   Pneumonia    PONV (postoperative nausea and vomiting)    Sepsis secondary to diabetic foot infection 03/13/2015   T2DM (type 2 diabetes mellitus) (HCC)    Takotsubo cardiomyopathy / transient apical balooning syndrome / stress-induced cardiomyopathy 05/10/2014   Unspecified essential hypertension     Past Surgical History:  Procedure Laterality Date   AMPUTATION TOE Left 03/16/2015   Procedure: left fifth toe amputation with incision and drainage;  Surgeon: Gwyneth Revels, DPM;  Location: [facility];  Service: Podiatry;  Laterality: Left;   APPLICATION OF WOUND VAC Left 02/14/2021   Procedure: APPLICATION OF WOUND VAC;  Surgeon: Gwyneth Revels, DPM;  Location: [facility];  Service: Podiatry;  Laterality: Left;   APPLICATION OF WOUND VAC Left 04/16/2021   Procedure: APPLICATION OF WOUND VAC;  Surgeon: Peggye Form, DO;  Location: [facility];  Service: Plastics;  Laterality: Left;   CARDIAC CATHETERIZATION Left 06/04/2009   Procedure: CARDIAC CATHETERIZATION; Location: [facility]   CHOLECYSTECTOMY     DEBRIDEMENT AND CLOSURE WOUND Left 04/16/2021   Procedure: DEBRIDEMENT AND CLOSURE WOUND;  Surgeon: Peggye Form, DO;  Location:  SURGERY CENTER;  Service: Plastics;  Laterality: Left;  1 hour   IRRIGATION AND DEBRIDEMENT FOOT Left 02/08/2021   Procedure: IRRIGATION AND DEBRIDEMENT FOOT - LFT HEEL ULCER;  Surgeon: Gwyneth Revels, DPM;  Location: [facility];  Service: Podiatry;  Laterality: Left;   IRRIGATION AND DEBRIDEMENT FOOT Left 02/14/2021   Procedure: IRRIGATION AND DEBRIDEMENT LEFT HEEL;  Surgeon: Gwyneth Revels, DPM;  Location: [facility];  Service: Podiatry;  Laterality: Left;   kidney stone removal     KNEE ARTHROSCOPY W/ MENISCAL REPAIR     NOSE  SURGERY  01/07/1989   due to fracture   TOE AMPUTATION     second toe   TONSILLECTOMY     TOTAL KNEE ARTHROPLASTY  01/11/2011   Procedure: TOTAL KNEE ARTHROPLASTY;  Surgeon: Harvie Junior;  Location: [facility];  Service: Orthopedics;  Laterality: Right;  COMPUTER ASSISTED TOTAL KNEE REPLACEMENT    Social History   Socioeconomic History   Marital status: Married    Spouse name: Not on file   Number of children: Not on file   Years of education: Not on file   Highest education level: Not on file  Occupational History   Not on file  Tobacco Use   Smoking status: Former    Current packs/day: 0.00    Types: Cigarettes    Quit date: 03/12/1985    Years since quitting: 37.5   Smokeless tobacco: Never  Vaping Use   Vaping status: Never Used  Substance and Sexual Activity   Alcohol use: No   Drug use: No   Sexual activity: Yes    Birth control/protection: Post-menopausal  Other Topics Concern   Not on file  Social History Narrative   Full time. Does not regularly exercise. Domestic partner.    Social Determinants of Health   Financial Resource Strain: Not on file  Food Insecurity: Not on file  Transportation Needs: Not on file  Physical Activity: Not on file  Stress: Not on file  Social Connections: Not on file  Intimate Partner Violence: Not on file    Family History  Problem Relation Age of Onset   Transient ischemic attack Mother    Asthma Mother    Hypertension Mother    Lung cancer Father    Hypertension Father    Asthma Sister    Anxiety disorder Sister    Stomach cancer Maternal Grandmother    Colon cancer Maternal Grandmother    Leukemia Maternal Grandmother    Melanoma Maternal Grandfather    Asthma Sister    Hypertension Brother    Cancer Other    Crohn's disease Other    GER disease Other    Breast cancer Neg Hx     Allergies  Allergen Reactions   Shellfish Allergy Anaphylaxis   Codeine Other (See Comments)    Migraine  Other Reaction(s): Other  (See Comments)  Reaction:  Severe migraines   Morphine And Codeine Other (See Comments)    Reaction:  Severe migraines    Percocet [Oxycodone-Acetaminophen] Nausea And Vomiting   Rosuvastatin     Other reaction(s): Muscle Pain   Penicillins Rash and Other (See Comments)    Is tolerating amoxicillin so no allergies   Sulfa Antibiotics Rash   Theophyllines Itching and Rash    Review of Systems  All other systems reviewed and are negative.      Objective:   BP 138/81   Pulse 70   Ht 5\' 9"  (1.753 m)   Wt 285 lb 6.4 oz (129.5 kg)   LMP 02/08/2013 (Within Months)  SpO2 94%   BMI 42.15 kg/m   Vitals:   09/04/22 1053  BP: 138/81  Pulse: 70  Height: 5\' 9"  (1.753 m)  Weight: 285 lb 6.4 oz (129.5 kg)  SpO2: 94%  BMI (Calculated): 42.13    Physical Exam Vitals and nursing note reviewed.  Constitutional:      Appearance: Normal appearance. She is normal weight.  HENT:     Head: Normocephalic.  Eyes:     Extraocular Movements: Extraocular movements intact.     Conjunctiva/sclera: Conjunctivae normal.     Pupils: Pupils are equal, round, and reactive to light.  Cardiovascular:     Rate and Rhythm: Normal rate.  Pulmonary:     Effort: Pulmonary effort is normal.  Neurological:     General: No focal deficit present.     Mental Status: She is alert and oriented to person, place, and time. Mental status is at baseline.  Psychiatric:        Mood and Affect: Mood normal.        Behavior: Behavior normal.        Thought Content: Thought content normal.        Judgment: Judgment normal.      Results for orders placed or performed in visit on 09/04/22  POCT Urinalysis Dipstick ([id number])  Result Value Ref Range   Color, UA Yellow    Clarity, UA Clear    Glucose, UA Positive (A) Negative   Bilirubin, UA Negative    Ketones, UA positive    Spec Grav, UA >=1.030 (A) 1.010 - 1.025   Blood, UA negative    pH, UA 6.0 5.0 - 8.0   Protein, UA Positive (A) Negative    Urobilinogen, UA 1.0 0.2 or 1.0 E.U./dL   Nitrite, UA positve    Leukocytes, UA Small (1+) (A) Negative   Appearance Clear    Odor No     Recent Results (from the past 2160 hour(s))  Lipid panel     Status: Abnormal   Collection Time: 08/07/22 12:10 PM  Result Value Ref Range   Cholesterol, Total 127 100 - 199 mg/dL   Triglycerides 454 (H) 0 - 149 mg/dL   HDL 37 (L) >09 mg/dL   VLDL Cholesterol Cal 27 5 - 40 mg/dL   LDL Chol Calc (NIH) 63 0 - 99 mg/dL   Chol/HDL Ratio 3.4 0.0 - 4.4 ratio    Comment:                                   T. Chol/HDL Ratio                                             Men  Women                               1/2 Avg.Risk  3.4    3.3                                   Avg.Risk  5.0    4.4  2X Avg.Risk  9.6    7.1                                3X Avg.Risk 23.4   11.0   VITAMIN D 25 Hydroxy (Vit-D Deficiency, Fractures)     Status: None   Collection Time: 08/07/22 12:10 PM  Result Value Ref Range   Vit D, 25-Hydroxy 41.4 30.0 - 100.0 ng/mL    Comment: Vitamin D deficiency has been defined by the Institute of Medicine and an Endocrine Society practice guideline as a level of serum 25-OH vitamin D less than 20 ng/mL (1,2). The Endocrine Society went on to further define vitamin D insufficiency as a level between 21 and 29 ng/mL (2). 1. IOM (Institute of Medicine). 2010. Dietary reference    intakes for calcium and D. [place]: The    [employer]. 2. Holick MF, [place], Bischoff-Ferrari HA, et al.    Evaluation, treatment, and prevention of vitamin D    deficiency: an Endocrine Society clinical practice    guideline. JCEM. 2011 Jul; 96(7):1911-30.   CMP14+EGFR     Status: Abnormal   Collection Time: 08/07/22 12:10 PM  Result Value Ref Range   Glucose 173 (H) 70 - 99 mg/dL   BUN 23 6 - 24 mg/dL   Creatinine, Ser 8.11 0.57 - 1.00 mg/dL   eGFR 81 >91 YN/WGN/5.62   BUN/Creatinine Ratio 27 (H) 9 - 23    Sodium 132 (L) 134 - 144 mmol/L   Potassium 4.9 3.5 - 5.2 mmol/L   Chloride 98 96 - 106 mmol/L   CO2 25 20 - 29 mmol/L   Calcium 9.4 8.7 - 10.2 mg/dL   Total Protein 7.0 6.0 - 8.5 g/dL   Albumin 4.0 3.8 - 4.9 g/dL   Globulin, Total 3.0 1.5 - 4.5 g/dL   Bilirubin Total 0.3 0.0 - 1.2 mg/dL   Alkaline Phosphatase 49 44 - 121 IU/L   AST 15 0 - 40 IU/L   ALT 16 0 - 32 IU/L  TSH     Status: None   Collection Time: 08/07/22 12:10 PM  Result Value Ref Range   TSH 2.160 0.450 - 4.500 uIU/mL  Hemoglobin A1c     Status: Abnormal   Collection Time: 08/07/22 12:10 PM  Result Value Ref Range   Hgb A1c MFr Bld 10.6 (H) 4.8 - 5.6 %    Comment:          Prediabetes: 5.7 - 6.4          Diabetes: >6.4          Glycemic control for adults with diabetes: <7.0    Est. average glucose Bld gHb Est-mCnc 258 mg/dL  Vitamin Z30     Status: Abnormal   Collection Time: 08/07/22 12:10 PM  Result Value Ref Range   Vitamin B-12 >2000 (H) 232 - 1245 pg/mL  CBC with Differential/Platelet     Status: Abnormal   Collection Time: 08/07/22 12:10 PM  Result Value Ref Range   WBC 9.2 3.4 - 10.8 x10E3/uL   RBC 4.31 3.77 - 5.28 x10E6/uL   Hemoglobin 12.3 11.1 - 15.9 g/dL   Hematocrit 86.5 78.4 - 46.6 %   MCV 90 79 - 97 fL   MCH 28.5 26.6 - 33.0 pg   MCHC 31.9 31.5 - 35.7 g/dL   RDW 69.6 29.5 - 28.4 %   Platelets 320 150 -  450 x10E3/uL   Neutrophils 61 Not Estab. %   Lymphs 26 Not Estab. %   Monocytes 7 Not Estab. %   Eos 5 Not Estab. %   Basos 1 Not Estab. %   Neutrophils Absolute 5.5 1.4 - 7.0 x10E3/uL   Lymphocytes Absolute 2.4 0.7 - 3.1 x10E3/uL   Monocytes Absolute 0.7 0.1 - 0.9 x10E3/uL   EOS (ABSOLUTE) 0.5 (H) 0.0 - 0.4 x10E3/uL   Basophils Absolute 0.1 0.0 - 0.2 x10E3/uL   Immature Granulocytes 0 Not Estab. %   Immature Grans (Abs) 0.0 0.0 - 0.1 x10E3/uL  POCT Urinalysis Dipstick ([id number])     Status: Abnormal   Collection Time: 08/28/22  2:49 PM  Result Value Ref Range   Color, UA yellow     Clarity, UA cloudy    Glucose, UA Positive (A) Negative   Bilirubin, UA neg    Ketones, UA neg    Spec Grav, UA >=1.030 (A) 1.010 - 1.025   Blood, UA positive    pH, UA 6.0 5.0 - 8.0   Protein, UA Positive (A) Negative   Urobilinogen, UA 0.2 0.2 or 1.0 E.U./dL   Nitrite, UA positive    Leukocytes, UA Negative Negative   Appearance cloudy    Odor yes   POCT Urinalysis Dipstick ([id number])     Status: Abnormal   Collection Time: 09/04/22 11:19 AM  Result Value Ref Range   Color, UA Yellow    Clarity, UA Clear    Glucose, UA Positive (A) Negative   Bilirubin, UA Negative    Ketones, UA positive    Spec Grav, UA >=1.030 (A) 1.010 - 1.025   Blood, UA negative    pH, UA 6.0 5.0 - 8.0   Protein, UA Positive (A) Negative   Urobilinogen, UA 1.0 0.2 or 1.0 E.U./dL   Nitrite, UA positve    Leukocytes, UA Small (1+) (A) Negative   Appearance Clear    Odor No        Assessment & Plan:   Problem List Items Addressed This Visit       Active Problems   Essential hypertension    Blood pressure well controlled with current medications.  Continue current therapy.  Will reassess at follow up.        Atherosclerosis of native coronary artery of native heart with angina pectoris [facility])    Patient is seen by Cardiology, who manage this condition.  She is well controlled with current therapy.   Will defer to them for further changes to plan of care.       Heart failure [facility])    Patient is seen by Cardiology, who manage this condition.  She is well controlled with current therapy.   Will defer to them for further changes to plan of care.       Mixed hyperlipidemia    Checking labs today.  Continue current therapy for lipid control. Will modify as needed based on labwork results.       Obesity (BMI 30-39.9)    Continue current meds.  Will adjust as needed based on results.  The patient is asked to make an attempt to improve diet and exercise patterns to aid in medical  management of this problem. Addressed importance of increasing and maintaining water intake.        Diabetes mellitus, type 2 (HCC) - Primary    Continue current diabetes POC, Patient needs Mounjaro, but her insurance currently makes this cost prohibitive.  As such, we  are working to discover the best way forward, and will continue to try and get this taken care of.   Will adjust meds if needed based on labs.       B12 deficiency due to diet    Continue B12 injections.  Will recheck labs prior to next appointment.       Vitamin D deficiency, unspecified    Checking labs today.  Will continue supplements as needed.        Other Visit Diagnoses     Recurrent UTI       UA In office today wnl.  no apparent UTI.   Relevant Orders   POCT Urinalysis Dipstick ([id number]) (Completed)       Return in about 3 months (around 12/05/2022).   Total time spent: 30 minutes  Miki Kins, FNP  09/04/2022   This document may have been prepared by [facility] Voice Recognition software and as such may include unintentional dictation errors.

## 2022-09-15 NOTE — Assessment & Plan Note (Addendum)
Continue current diabetes POC, Patient needs Nancy [name], but her insurance currently makes this cost prohibitive.  As such, we are working to discover the best way forward, and will continue to try and get this taken care of.   Will adjust meds if needed based on labs.

## 2022-09-15 NOTE — Assessment & Plan Note (Signed)
Checking labs today.  Will continue supplements as needed.  

## 2022-09-17 ENCOUNTER — Other Ambulatory Visit: Payer: Self-pay | Admitting: Family

## 2022-09-18 ENCOUNTER — Other Ambulatory Visit: Payer: Self-pay

## 2022-09-18 ENCOUNTER — Emergency Department: Payer: 59

## 2022-09-18 ENCOUNTER — Inpatient Hospital Stay
Admission: EM | Admit: 2022-09-18 | Discharge: 2022-09-30 | DRG: 853 | Disposition: A | Discharge status: Home Health | Payer: 59 | Attending: Internal Medicine | Admitting: Internal Medicine

## 2022-09-18 DIAGNOSIS — E1165 Type 2 diabetes mellitus with hyperglycemia: Secondary | ICD-10-CM | POA: Diagnosis present

## 2022-09-18 DIAGNOSIS — Z8249 Family history of ischemic heart disease and other diseases of the circulatory system: Secondary | ICD-10-CM

## 2022-09-18 DIAGNOSIS — Z7984 Long term (current) use of oral hypoglycemic drugs: Secondary | ICD-10-CM

## 2022-09-18 DIAGNOSIS — G546 Phantom limb syndrome with pain: Secondary | ICD-10-CM | POA: Diagnosis not present

## 2022-09-18 DIAGNOSIS — E1100 Type 2 diabetes mellitus with hyperosmolarity without nonketotic hyperglycemic-hyperosmolar coma (NKHHC): Secondary | ICD-10-CM | POA: Diagnosis not present

## 2022-09-18 DIAGNOSIS — A4101 Sepsis due to Methicillin susceptible Staphylococcus aureus: Principal | ICD-10-CM | POA: Diagnosis present

## 2022-09-18 DIAGNOSIS — Z800 Family history of malignant neoplasm of digestive organs: Secondary | ICD-10-CM

## 2022-09-18 DIAGNOSIS — E11621 Type 2 diabetes mellitus with foot ulcer: Secondary | ICD-10-CM | POA: Diagnosis not present

## 2022-09-18 DIAGNOSIS — G4700 Insomnia, unspecified: Secondary | ICD-10-CM | POA: Diagnosis not present

## 2022-09-18 DIAGNOSIS — E669 Obesity, unspecified: Secondary | ICD-10-CM | POA: Diagnosis not present

## 2022-09-18 DIAGNOSIS — M86672 Other chronic osteomyelitis, left ankle and foot: Secondary | ICD-10-CM | POA: Diagnosis not present

## 2022-09-18 DIAGNOSIS — L97429 Non-pressure chronic ulcer of left heel and midfoot with unspecified severity: Secondary | ICD-10-CM | POA: Diagnosis not present

## 2022-09-18 DIAGNOSIS — M19072 Primary osteoarthritis, left ankle and foot: Secondary | ICD-10-CM | POA: Diagnosis not present

## 2022-09-18 DIAGNOSIS — Z9049 Acquired absence of other specified parts of digestive tract: Secondary | ICD-10-CM

## 2022-09-18 DIAGNOSIS — E119 Type 2 diabetes mellitus without complications: Secondary | ICD-10-CM

## 2022-09-18 DIAGNOSIS — E1169 Type 2 diabetes mellitus with other specified complication: Secondary | ICD-10-CM | POA: Diagnosis not present

## 2022-09-18 DIAGNOSIS — Z818 Family history of other mental and behavioral disorders: Secondary | ICD-10-CM

## 2022-09-18 DIAGNOSIS — Z6841 Body Mass Index (BMI) 40.0 and over, adult: Secondary | ICD-10-CM

## 2022-09-18 DIAGNOSIS — A480 Gas gangrene: Secondary | ICD-10-CM | POA: Diagnosis not present

## 2022-09-18 DIAGNOSIS — Z7985 Long-term (current) use of injectable non-insulin antidiabetic drugs: Secondary | ICD-10-CM

## 2022-09-18 DIAGNOSIS — L03116 Cellulitis of left lower limb: Secondary | ICD-10-CM | POA: Diagnosis present

## 2022-09-18 DIAGNOSIS — Z7982 Long term (current) use of aspirin: Secondary | ICD-10-CM

## 2022-09-18 DIAGNOSIS — Z9889 Other specified postprocedural states: Secondary | ICD-10-CM | POA: Diagnosis not present

## 2022-09-18 DIAGNOSIS — E1122 Type 2 diabetes mellitus with diabetic chronic kidney disease: Secondary | ICD-10-CM | POA: Diagnosis not present

## 2022-09-18 DIAGNOSIS — Z96651 Presence of right artificial knee joint: Secondary | ICD-10-CM | POA: Diagnosis present

## 2022-09-18 DIAGNOSIS — Z888 Allergy status to other drugs, medicaments and biological substances status: Secondary | ICD-10-CM

## 2022-09-18 DIAGNOSIS — E11628 Type 2 diabetes mellitus with other skin complications: Secondary | ICD-10-CM | POA: Diagnosis present

## 2022-09-18 DIAGNOSIS — Z885 Allergy status to narcotic agent status: Secondary | ICD-10-CM

## 2022-09-18 DIAGNOSIS — E782 Mixed hyperlipidemia: Secondary | ICD-10-CM | POA: Diagnosis present

## 2022-09-18 DIAGNOSIS — E1152 Type 2 diabetes mellitus with diabetic peripheral angiopathy with gangrene: Secondary | ICD-10-CM | POA: Diagnosis present

## 2022-09-18 DIAGNOSIS — I96 Gangrene, not elsewhere classified: Secondary | ICD-10-CM

## 2022-09-18 DIAGNOSIS — F4323 Adjustment disorder with mixed anxiety and depressed mood: Secondary | ICD-10-CM | POA: Diagnosis present

## 2022-09-18 DIAGNOSIS — I2510 Atherosclerotic heart disease of native coronary artery without angina pectoris: Secondary | ICD-10-CM | POA: Diagnosis not present

## 2022-09-18 DIAGNOSIS — E1142 Type 2 diabetes mellitus with diabetic polyneuropathy: Secondary | ICD-10-CM | POA: Diagnosis not present

## 2022-09-18 DIAGNOSIS — K219 Gastro-esophageal reflux disease without esophagitis: Secondary | ICD-10-CM | POA: Diagnosis present

## 2022-09-18 DIAGNOSIS — Z87891 Personal history of nicotine dependence: Secondary | ICD-10-CM | POA: Diagnosis not present

## 2022-09-18 DIAGNOSIS — I5181 Takotsubo syndrome: Secondary | ICD-10-CM | POA: Diagnosis present

## 2022-09-18 DIAGNOSIS — J449 Chronic obstructive pulmonary disease, unspecified: Secondary | ICD-10-CM | POA: Diagnosis not present

## 2022-09-18 DIAGNOSIS — I10 Essential (primary) hypertension: Secondary | ICD-10-CM | POA: Diagnosis present

## 2022-09-18 DIAGNOSIS — K5900 Constipation, unspecified: Secondary | ICD-10-CM | POA: Diagnosis not present

## 2022-09-18 DIAGNOSIS — Z801 Family history of malignant neoplasm of trachea, bronchus and lung: Secondary | ICD-10-CM

## 2022-09-18 DIAGNOSIS — I509 Heart failure, unspecified: Secondary | ICD-10-CM | POA: Diagnosis not present

## 2022-09-18 DIAGNOSIS — M86472 Chronic osteomyelitis with draining sinus, left ankle and foot: Secondary | ICD-10-CM

## 2022-09-18 DIAGNOSIS — Z79899 Other long term (current) drug therapy: Secondary | ICD-10-CM

## 2022-09-18 DIAGNOSIS — B9561 Methicillin susceptible Staphylococcus aureus infection as the cause of diseases classified elsewhere: Secondary | ICD-10-CM | POA: Diagnosis present

## 2022-09-18 DIAGNOSIS — R7881 Bacteremia: Secondary | ICD-10-CM | POA: Diagnosis not present

## 2022-09-18 DIAGNOSIS — D72829 Elevated white blood cell count, unspecified: Secondary | ICD-10-CM | POA: Diagnosis not present

## 2022-09-18 DIAGNOSIS — Z808 Family history of malignant neoplasm of other organs or systems: Secondary | ICD-10-CM

## 2022-09-18 DIAGNOSIS — S88112A Complete traumatic amputation at level between knee and ankle, left lower leg, initial encounter: Secondary | ICD-10-CM | POA: Diagnosis not present

## 2022-09-18 DIAGNOSIS — E875 Hyperkalemia: Secondary | ICD-10-CM | POA: Diagnosis not present

## 2022-09-18 DIAGNOSIS — I110 Hypertensive heart disease with heart failure: Secondary | ICD-10-CM | POA: Diagnosis not present

## 2022-09-18 DIAGNOSIS — Z89422 Acquired absence of other left toe(s): Secondary | ICD-10-CM | POA: Diagnosis not present

## 2022-09-18 DIAGNOSIS — L97509 Non-pressure chronic ulcer of other part of unspecified foot with unspecified severity: Secondary | ICD-10-CM | POA: Diagnosis not present

## 2022-09-18 DIAGNOSIS — Z89512 Acquired absence of left leg below knee: Secondary | ICD-10-CM | POA: Diagnosis not present

## 2022-09-18 DIAGNOSIS — D509 Iron deficiency anemia, unspecified: Secondary | ICD-10-CM | POA: Diagnosis present

## 2022-09-18 DIAGNOSIS — M869 Osteomyelitis, unspecified: Secondary | ICD-10-CM

## 2022-09-18 DIAGNOSIS — L97529 Non-pressure chronic ulcer of other part of left foot with unspecified severity: Secondary | ICD-10-CM | POA: Diagnosis not present

## 2022-09-18 DIAGNOSIS — A419 Sepsis, unspecified organism: Secondary | ICD-10-CM | POA: Diagnosis not present

## 2022-09-18 DIAGNOSIS — Z882 Allergy status to sulfonamides status: Secondary | ICD-10-CM

## 2022-09-18 DIAGNOSIS — J4489 Other specified chronic obstructive pulmonary disease: Secondary | ICD-10-CM | POA: Diagnosis present

## 2022-09-18 DIAGNOSIS — Z880 Allergy status to penicillin: Secondary | ICD-10-CM

## 2022-09-18 DIAGNOSIS — Z794 Long term (current) use of insulin: Secondary | ICD-10-CM | POA: Diagnosis not present

## 2022-09-18 DIAGNOSIS — J45909 Unspecified asthma, uncomplicated: Secondary | ICD-10-CM | POA: Diagnosis present

## 2022-09-18 DIAGNOSIS — L97524 Non-pressure chronic ulcer of other part of left foot with necrosis of bone: Secondary | ICD-10-CM | POA: Diagnosis not present

## 2022-09-18 DIAGNOSIS — E1140 Type 2 diabetes mellitus with diabetic neuropathy, unspecified: Secondary | ICD-10-CM | POA: Diagnosis present

## 2022-09-18 DIAGNOSIS — L089 Local infection of the skin and subcutaneous tissue, unspecified: Secondary | ICD-10-CM | POA: Diagnosis present

## 2022-09-18 DIAGNOSIS — E785 Hyperlipidemia, unspecified: Secondary | ICD-10-CM | POA: Diagnosis not present

## 2022-09-18 DIAGNOSIS — Z91013 Allergy to seafood: Secondary | ICD-10-CM

## 2022-09-18 DIAGNOSIS — M868X7 Other osteomyelitis, ankle and foot: Secondary | ICD-10-CM | POA: Diagnosis not present

## 2022-09-18 DIAGNOSIS — E1151 Type 2 diabetes mellitus with diabetic peripheral angiopathy without gangrene: Secondary | ICD-10-CM | POA: Diagnosis not present

## 2022-09-18 DIAGNOSIS — Z825 Family history of asthma and other chronic lower respiratory diseases: Secondary | ICD-10-CM

## 2022-09-18 DIAGNOSIS — I25119 Atherosclerotic heart disease of native coronary artery with unspecified angina pectoris: Secondary | ICD-10-CM | POA: Diagnosis not present

## 2022-09-18 DIAGNOSIS — Z823 Family history of stroke: Secondary | ICD-10-CM

## 2022-09-18 DIAGNOSIS — Z806 Family history of leukemia: Secondary | ICD-10-CM

## 2022-09-18 HISTORY — DX: Gas gangrene: A48.0

## 2022-09-18 HISTORY — DX: Osteomyelitis, unspecified: M86.9

## 2022-09-18 HISTORY — DX: Cellulitis of left lower limb: L03.116

## 2022-09-18 LAB — LIPASE, BLOOD: Lipase: 19 U/L (ref 11–51)

## 2022-09-18 LAB — CBC
HCT: 33.8 % — ABNORMAL LOW (ref 36.0–46.0)
Hemoglobin: 10.5 g/dL — ABNORMAL LOW (ref 12.0–15.0)
MCH: 28.5 pg (ref 26.0–34.0)
MCHC: 31.1 g/dL (ref 30.0–36.0)
MCV: 91.8 fL (ref 80.0–100.0)
Platelets: 460 10*3/uL — ABNORMAL HIGH (ref 150–400)
RBC: 3.68 MIL/uL — ABNORMAL LOW (ref 3.87–5.11)
RDW: 13.1 % (ref 11.5–15.5)
WBC: 19.9 10*3/uL — ABNORMAL HIGH (ref 4.0–10.5)
nRBC: 0 % (ref 0.0–0.2)

## 2022-09-18 LAB — SEDIMENTATION RATE
Sed Rate: 80 mm/h — ABNORMAL HIGH (ref 0–30)
Sed Rate: 107 mm/h — ABNORMAL HIGH (ref 0–30)

## 2022-09-18 LAB — COMPREHENSIVE METABOLIC PANEL
ALT: 21 U/L (ref 0–44)
AST: 20 U/L (ref 15–41)
Albumin: 2.9 g/dL — ABNORMAL LOW (ref 3.5–5.0)
Alkaline Phosphatase: 101 U/L (ref 38–126)
Anion gap: 13 (ref 5–15)
BUN: 28 mg/dL — ABNORMAL HIGH (ref 6–20)
CO2: 23 mmol/L (ref 22–32)
Calcium: 9 mg/dL (ref 8.9–10.3)
Chloride: 94 mmol/L — ABNORMAL LOW (ref 98–111)
Creatinine, Ser: 0.97 mg/dL (ref 0.44–1.00)
GFR, Estimated: >60 mL/min (ref >60)
Glucose, Bld: 336 mg/dL — ABNORMAL HIGH (ref 70–99)
Potassium: 5 mmol/L (ref 3.5–5.1)
Sodium: 130 mmol/L — ABNORMAL LOW (ref 135–145)
Total Bilirubin: 1 mg/dL (ref 0.3–1.2)
Total Protein: 7.8 g/dL (ref 6.5–8.1)

## 2022-09-18 LAB — PROTIME-INR
INR: 1.5 — ABNORMAL HIGH (ref 0.8–1.2)
Prothrombin Time: 17.9 s — ABNORMAL HIGH (ref 11.4–15.2)

## 2022-09-18 LAB — HIV ANTIBODY (ROUTINE TESTING W REFLEX): HIV Screen 4th Generation wRfx: NONREACTIVE

## 2022-09-18 LAB — PREALBUMIN: Prealbumin: <5 mg/dL — ABNORMAL LOW (ref 18–38)

## 2022-09-18 LAB — C-REACTIVE PROTEIN
CRP: 47.1 mg/dL — ABNORMAL HIGH (ref <1.0)
CRP: 47.1 mg/dL — ABNORMAL HIGH (ref <1.0)

## 2022-09-18 LAB — LACTIC ACID, PLASMA
Lactic Acid, Venous: 2.1 mmol/L (ref 0.5–1.9)
Lactic Acid, Venous: 1.2 mmol/L (ref 0.5–1.9)

## 2022-09-18 LAB — GLUCOSE, CAPILLARY: Glucose-Capillary: 340 mg/dL — ABNORMAL HIGH (ref 70–99)

## 2022-09-18 LAB — APTT: aPTT: 51 s — ABNORMAL HIGH (ref 24–36)

## 2022-09-18 MED ORDER — INSULIN ASPART 100 UNIT/ML IJ SOLN
0.0000 [IU] | Freq: Three times a day (TID) | INTRAMUSCULAR | Status: DC
  Administered 2022-09-19 – 2022-09-20 (×3): 11 [IU] via SUBCUTANEOUS
  Administered 2022-09-20: 15 [IU] via SUBCUTANEOUS
  Administered 2022-09-20: 8 [IU] via SUBCUTANEOUS
  Administered 2022-09-21: 11 [IU] via SUBCUTANEOUS
  Administered 2022-09-21: 5 [IU] via SUBCUTANEOUS
  Administered 2022-09-21: 8 [IU] via SUBCUTANEOUS
  Administered 2022-09-22 (×3): 3 [IU] via SUBCUTANEOUS
  Administered 2022-09-23 (×2): 5 [IU] via SUBCUTANEOUS
  Administered 2022-09-23: 2 [IU] via SUBCUTANEOUS
  Administered 2022-09-24 (×2): 5 [IU] via SUBCUTANEOUS
  Administered 2022-09-24: 11 [IU] via SUBCUTANEOUS
  Administered 2022-09-25: 5 [IU] via SUBCUTANEOUS
  Administered 2022-09-25: 8 [IU] via SUBCUTANEOUS
  Administered 2022-09-25 – 2022-09-26 (×2): 3 [IU] via SUBCUTANEOUS
  Administered 2022-09-26: 5 [IU] via SUBCUTANEOUS
  Administered 2022-09-27: 3 [IU] via SUBCUTANEOUS
  Administered 2022-09-27: 5 [IU] via SUBCUTANEOUS
  Administered 2022-09-27 – 2022-09-30 (×8): 3 [IU] via SUBCUTANEOUS
  Administered 2022-09-30: 2 [IU] via SUBCUTANEOUS
  Filled 2022-09-18 (×33): qty 1

## 2022-09-18 MED ORDER — SODIUM CHLORIDE 0.9 % IV SOLN
2.0000 g | Freq: Three times a day (TID) | INTRAVENOUS | Status: DC
  Administered 2022-09-18 – 2022-09-19 (×4): 2 g via INTRAVENOUS
  Filled 2022-09-18 (×5): qty 12.5

## 2022-09-18 MED ORDER — LACTATED RINGERS IV SOLN
150.0000 mL/h | INTRAVENOUS | Status: AC
  Administered 2022-09-18: 150 mL/h via INTRAVENOUS

## 2022-09-18 MED ORDER — LORAZEPAM 0.5 MG PO TABS
0.5000 mg | ORAL_TABLET | Freq: Once | ORAL | Status: AC
  Administered 2022-09-18: 0.5 mg via ORAL
  Filled 2022-09-18: qty 1

## 2022-09-18 MED ORDER — VANCOMYCIN HCL 10 G IV SOLR: 20.0000 mg/kg | Freq: Once | INTRAVENOUS | Status: DC

## 2022-09-18 MED ORDER — METRONIDAZOLE 500 MG/100ML IV SOLN
500.0000 mg | Freq: Three times a day (TID) | INTRAVENOUS | Status: DC
  Administered 2022-09-18 – 2022-09-23 (×14): 500 mg via INTRAVENOUS
  Filled 2022-09-18 (×16): qty 100

## 2022-09-18 MED ORDER — LACTATED RINGERS IV BOLUS
1000.0000 mL | Freq: Once | INTRAVENOUS | Status: AC
  Administered 2022-09-18: 1000 mL via INTRAVENOUS

## 2022-09-18 MED ORDER — ACETAMINOPHEN 650 MG RE SUPP: 650.0000 mg | Freq: Four times a day (QID) | RECTAL | Status: DC | PRN

## 2022-09-18 MED ORDER — ONDANSETRON HCL 4 MG/2ML IJ SOLN
4.0000 mg | Freq: Once | INTRAMUSCULAR | Status: AC
  Administered 2022-09-18: 4 mg via INTRAVENOUS
  Filled 2022-09-18: qty 2

## 2022-09-18 MED ORDER — INSULIN ASPART 100 UNIT/ML IJ SOLN
0.0000 [IU] | Freq: Every day | INTRAMUSCULAR | Status: DC
  Administered 2022-09-18: 4 [IU] via SUBCUTANEOUS
  Administered 2022-09-20 – 2022-09-24 (×2): 3 [IU] via SUBCUTANEOUS
  Administered 2022-09-26: 2 [IU] via SUBCUTANEOUS
  Filled 2022-09-18 (×4): qty 1

## 2022-09-18 MED ORDER — VANCOMYCIN HCL 1750 MG/350ML IV SOLN
1750.0000 mg | INTRAVENOUS | Status: DC
  Administered 2022-09-19: 1750 mg via INTRAVENOUS
  Filled 2022-09-18: qty 350

## 2022-09-18 MED ORDER — ACETAMINOPHEN 325 MG PO TABS
650.0000 mg | ORAL_TABLET | Freq: Four times a day (QID) | ORAL | Status: DC | PRN
  Administered 2022-09-18 – 2022-09-28 (×5): 650 mg via ORAL
  Filled 2022-09-18 (×5): qty 2

## 2022-09-18 MED ORDER — INSULIN ASPART 100 UNIT/ML IJ SOLN
3.0000 [IU] | Freq: Three times a day (TID) | INTRAMUSCULAR | Status: DC
  Administered 2022-09-19: 3 [IU] via SUBCUTANEOUS
  Filled 2022-09-18 (×2): qty 1

## 2022-09-18 MED ORDER — ALPRAZOLAM 0.5 MG PO TABS
0.5000 mg | ORAL_TABLET | Freq: Two times a day (BID) | ORAL | Status: DC | PRN
  Administered 2022-09-18 – 2022-09-29 (×12): 0.5 mg via ORAL
  Filled 2022-09-18 (×12): qty 1

## 2022-09-18 MED ORDER — VANCOMYCIN HCL 1500 MG/300ML IV SOLN
1500.0000 mg | Freq: Once | INTRAVENOUS | Status: DC
  Filled 2022-09-18: qty 300

## 2022-09-18 MED ORDER — VANCOMYCIN HCL IN DEXTROSE 1-5 GM/200ML-% IV SOLN
1000.0000 mg | Freq: Once | INTRAVENOUS | Status: AC
  Administered 2022-09-18: 1000 mg via INTRAVENOUS
  Filled 2022-09-18: qty 200

## 2022-09-18 MED ORDER — ENOXAPARIN SODIUM 60 MG/0.6ML IJ SOSY
60.0000 mg | PREFILLED_SYRINGE | INTRAMUSCULAR | Status: DC
  Administered 2022-09-18 – 2022-09-29 (×12): 60 mg via SUBCUTANEOUS
  Filled 2022-09-18 (×12): qty 0.6

## 2022-09-18 MED ORDER — LORAZEPAM 2 MG/ML PO CONC
0.5000 mg | Freq: Once | ORAL | Status: DC
Start: 2022-09-18 — End: 2022-09-18

## 2022-09-18 MED ORDER — FENTANYL CITRATE PF 50 MCG/ML IJ SOSY
50.0000 ug | PREFILLED_SYRINGE | Freq: Once | INTRAMUSCULAR | Status: AC
  Administered 2022-09-18: 50 ug via INTRAVENOUS
  Filled 2022-09-18: qty 1

## 2022-09-18 MED ORDER — HYDROMORPHONE HCL 1 MG/ML IJ SOLN
1.0000 mg | INTRAMUSCULAR | Status: DC | PRN
  Administered 2022-09-18 – 2022-09-25 (×11): 1 mg via INTRAVENOUS
  Filled 2022-09-18 (×12): qty 1

## 2022-09-18 MED ORDER — SODIUM CHLORIDE 0.9 % IV SOLN: INTRAVENOUS | Status: DC

## 2022-09-18 NOTE — Assessment & Plan Note (Signed)
Stable.  Continue statin. 

## 2022-09-18 NOTE — ED Notes (Addendum)
Wound doctor at bedside cleaning pt wound

## 2022-09-18 NOTE — ED Notes (Signed)
Hospitalist messaged to order tylenol for pt tempeture, see orders.

## 2022-09-18 NOTE — ED Provider Notes (Addendum)
[facility] Provider Note    Event Date/Time   First MD Initiated Contact with Patient 09/18/22 1507     (approximate)   History   multiple complaints   HPI Nancy [name] is a 57 y.o. female with HTN, DM2, depression presenting today for leg swelling.  Patient has history of diabetic ulcer to her left heel but has noticed worsening pain and skin changes over the past several days.  New redness has developed over the past 3 days extending up her leg.  She started having fevers and nausea as well.  Follows with podiatry for this wound.  No recent antibiotic use.     Physical Exam   Triage Vital Signs: ED Triage Vitals  Encounter Vitals Group     BP 09/18/22 1345 (!) 179/71     Systolic BP Percentile --      Diastolic BP Percentile --      Pulse Rate 09/18/22 1345 97     Resp 09/18/22 1345 18     Temp 09/18/22 1345 99.3 F (37.4 C)     Temp src --      SpO2 09/18/22 1345 95 %     Weight 09/18/22 1346 274 lb (124.3 kg)     Height 09/18/22 1346 5\' 9"  (1.753 m)     Head Circumference --      Peak Flow --      Pain Score 09/18/22 1346 8     Pain Loc --      Pain Education --      Exclude from Growth Chart --     Most recent vital signs: Vitals:   09/18/22 1345  BP: (!) 179/71  Pulse: 97  Resp: 18  Temp: 99.3 F (37.4 C)  SpO2: 95%   I have reviewed the vital signs. General:  Awake, alert, no acute distress. Head:  Normocephalic, Atraumatic. EENT:  PERRL, EOMI, Oral mucosa pink and moist, Neck is supple. Cardiovascular: Regular rate, 2+ distal pulses. Respiratory:  Normal respiratory effort, symmetrical expansion, no distress.   Extremities: Mild swelling noted to the left lower extremity Neuro:  Alert and oriented.  Interacting appropriately.   Skin: Large diabetic ulcer to left heel with surrounding erythema, and other tissue changes concerning for necrotic wound.  Diminished sensation in foot which is chronic with peripheral  neuropathy. Psych: Appropriate affect.     ED Results / Procedures / Treatments   Labs (all labs ordered are listed, but only abnormal results are displayed) Labs Reviewed  CBC - Abnormal; Notable for the following components:      Result Value   WBC 19.9 (*)    RBC 3.68 (*)    Hemoglobin 10.5 (*)    HCT 33.8 (*)    Platelets 460 (*)    All other components within normal limits  COMPREHENSIVE METABOLIC PANEL - Abnormal; Notable for the following components:   Sodium 130 (*)    Chloride 94 (*)    Glucose, Bld 336 (*)    BUN 28 (*)    Albumin 2.9 (*)    All other components within normal limits  AEROBIC/ANAEROBIC CULTURE W GRAM STAIN (SURGICAL/DEEP WOUND)  CULTURE, BLOOD (ROUTINE X 2)  CULTURE, BLOOD (ROUTINE X 2)  LIPASE, BLOOD  SEDIMENTATION RATE  C-REACTIVE PROTEIN     EKG    RADIOLOGY Independently interpreted x-ray of left heel shows significant soft tissue breakdown and possible concern of breakdown of the left calcaneus concerning for necrosis.  PROCEDURES:  Critical Care performed: No  .Critical Care  Performed by: Janith Lima, MD Authorized by: Janith Lima, MD   Critical care provider statement:    Critical care time (minutes):  35   Critical care was necessary to treat or prevent imminent or life-threatening deterioration of the following conditions:  Sepsis (gas gangrene of left heel)   Critical care was time spent personally by me on the following activities:  Development of treatment plan with patient or surrogate, discussions with consultants, evaluation of patient's response to treatment, examination of patient, ordering and review of laboratory studies, ordering and review of radiographic studies, ordering and performing treatments and interventions, pulse oximetry, re-evaluation of patient's condition and review of old charts    MEDICATIONS ORDERED IN ED: Medications  ceFEPIme (MAXIPIME) 2 g in sodium chloride 0.9 % 100 mL IVPB (2 g  Intravenous New Bag/Given 09/18/22 1558)  vancomycin (VANCOCIN) IVPB 1000 mg/200 mL premix (has no administration in time range)    Followed by  vancomycin (VANCOREADY) IVPB 1500 mg/300 mL (has no administration in time range)  lactated ringers bolus 1,000 mL (1,000 mLs Intravenous New Bag/Given 09/18/22 1542)  fentaNYL (SUBLIMAZE) injection 50 mcg (50 mcg Intravenous Given 09/18/22 1539)  ondansetron (ZOFRAN) injection 4 mg (4 mg Intravenous Given 09/18/22 1538)     IMPRESSION / MDM / ASSESSMENT AND PLAN / ED COURSE  I reviewed the triage vital signs and the nursing notes.                              Differential diagnosis includes, but is not limited to, diabetic ulcer with necrosis of the soft tissue and bone, cellulitis, sepsis.  Patient's presentation is most consistent with acute presentation with potential threat to life or bodily function.  Patient is a 57 year old female presenting today for infected diabetic ulcer of the left heel.  Worsening symptoms over the past 2 days along with fevers at home.  Elevated leukocytosis here.  X-ray showing breakdown of the soft tissue as well as concern for necrosis of the bone.  Spoke with podiatry who recommends admission for IV antibiotics and plan for surgical debridement.  Patient started on vancomycin and cefepime.  Given fluids here in the emergency department along with pain and nausea control.  Patient admitted to hospitalist for further care.  The patient is on the cardiac monitor to evaluate for evidence of arrhythmia and/or significant heart rate changes. Clinical Course as of 09/18/22 1611  Wed Sep 18, 2022  1528 WBC(!): 19.9 [DW]  1528 Sodium(!): 130 135 when corrected for hyperglycemia [DW]  1538 Spoke with Dr. Excell Seltzer with podiatry.  Agrees with admission for IV antibiotics and likely surgical debridement. [DW]    Clinical Course User Index [DW] Janith Lima, MD     FINAL CLINICAL IMPRESSION(S) / ED DIAGNOSES   Final  diagnoses:  Diabetic ulcer of left heel associated with type 2 diabetes mellitus, unspecified ulcer stage (HCC)  Leukocytosis, unspecified type  Cellulitis of left lower extremity     Rx / DC Orders   ED Discharge Orders     None        Note:  This document was prepared using Dragon voice recognition software and may include unintentional dictation errors.   Janith Lima, MD 09/18/22 1611    Janith Lima, MD 09/18/22 1620

## 2022-09-18 NOTE — Assessment & Plan Note (Signed)
Stable from respiratory standpoint Continue home inhalers 

## 2022-09-18 NOTE — Assessment & Plan Note (Signed)
Worsening L heel pain w/ gas gangrene secondary to posterior heel ulceration with osteomyelitis on imaging  Vascular surgery and podiatry has evaluated the patient w/ plan for likely BKA vs. AKA pending 48 hours of IV antibiotics  Will place on IV cefepime, flagyl and vancomycin for infectious coverage  Blood cultures drawn  Follow up formal podiatry and vascular surgery recommendations

## 2022-09-18 NOTE — Assessment & Plan Note (Signed)
Poorly controlled Blood sugar in 300s Sliding-scale insulin A1c Monitor

## 2022-09-18 NOTE — ED Notes (Signed)
Dr. Excell Seltzer at bedside explaining options to pt and family.

## 2022-09-18 NOTE — Progress Notes (Signed)
       CROSS COVER NOTE  NAME: Nancy [name] MRN: [id number] DOB : 03/19/1965    Concern as stated by nurse / staff    Patient is asking for her anxiety medicine, she said she is taking Xanax at home twice daily. No PRN ordered.    Pertinent findings on chart review: Patient with history of anxiety and panic attacks  Assessment and  Interventions   Assessment:    09/18/2022    8:15 PM 09/18/2022    6:36 PM 09/18/2022    5:31 PM  Vitals with BMI  Systolic 125 180 086  Diastolic 53 72 62  Pulse 90 94 99    Plan: Xanax ordered from med       Donnie Mesa NP [facility] Cross Cover 7pm-7am - check amion for availability Pager [phone]

## 2022-09-18 NOTE — Progress Notes (Signed)
PHARMACIST - PHYSICIAN COMMUNICATION  CONCERNING:  Enoxaparin (Lovenox) for DVT Prophylaxis    RECOMMENDATION: Patient was prescribed enoxaprin 40mg  q24 hours for VTE prophylaxis.   Filed Weights   09/18/22 1346  Weight: 124.3 kg (274 lb)    Body mass index is 40.46 kg/m.  Estimated Creatinine Clearance: 90.3 mL/min (by C-G formula based on SCr of 0.97 mg/dL).   Based on [facility] policy patient is candidate for enoxaparin 0.5mg /kg TBW SQ every 24 hours based on BMI being >30.   DESCRIPTION: Pharmacy has adjusted enoxaparin dose per [facility] policy.  Patient is now receiving enoxaparin 60 mg every 24 hours    Barrie Folk, PharmD Clinical Pharmacist  09/18/2022 5:34 PM

## 2022-09-18 NOTE — Consult Note (Signed)
Hospital Consult    Reason for Consult:  Gangrene of left foot/Heel with open sore.  Requesting Physician:  Dr Claudell Kyle MD MRN #:  [id number]  History of Present Illness: This is a 57 y.o. female with a known HX of diabetic ulcer to her left heel but has noticed worsening pain and skin changes over the past several days. New redness has developed over the past 3 days extending up her leg. She started having fevers and nausea as well. Follows with podiatry for this wound.   On exam she is resting comfortably in the emergency room. She denies any chest pain, shortness of breath, dizziness or blurred vision.   Past Medical History:  Diagnosis Date   Abnormal cardiovascular stress test 09/21/2018   Formatting of this note might be different from the original. Lexiscan Myoview 09/16/2018 revealed mild anterior ischemia   Adverse effect of motion 05/10/2014   Anxiety    Arthritis    r knee   Asthma    Breast cyst 05/10/2014   CAD (coronary artery disease)    a.) LHC 06/04/2009 at [facility]; non-obstructive CAD. b.) CTA with FFR 10/08/2018: extensive mixed plaque proximal to mid LAD (51-69%); Coronary Ca score 217; FFR 0.71 dPDA, 0.86 mLAD, 0.87 dLCx.   CCF (congestive cardiac failure) (HCC) 06/06/2008   a.) 30% EF. b.) TTE 06/03/2011: EF >55%; triv MR. c.) TTE 04/27/2018: EF 55%; mild LVH; triv PR, mild MR/TR; G1DD.   Chest pain with high risk for cardiac etiology 07/02/2016   Chronic use of opiate drug for therapeutic purpose 02/13/2022   Formatting of this note might be different from the original. [facility] Contract signed on 04/18/16 & updated 03/04/17; UDS done on 04/18/16.   Complication of anesthesia    Diabetic foot infection (HCC) 03/13/2015   Diabetic foot ulcer (HCC) 02/08/2021   Eczema    Family history of adverse reaction to anesthesia    a.) PONV in mother and grandmother   GERD (gastroesophageal reflux disease) 05/10/2014   History of kidney stones    HLD (hyperlipidemia)     Hyponatremia 03/13/2015   Migraines    Motion sickness    Other acute osteomyelitis, left ankle and foot (HCC) 02/13/2022   Panic attacks    Peripheral edema 04/27/2018   Pneumonia    PONV (postoperative nausea and vomiting)    Sepsis secondary to diabetic foot infection 03/13/2015   T2DM (type 2 diabetes mellitus) (HCC)    Takotsubo cardiomyopathy / transient apical balooning syndrome / stress-induced cardiomyopathy 05/10/2014   Unspecified essential hypertension     Past Surgical History:  Procedure Laterality Date   AMPUTATION TOE Left 03/16/2015   Procedure: left fifth toe amputation with incision and drainage;  Surgeon: Gwyneth Revels, DPM;  Location: [facility];  Service: Podiatry;  Laterality: Left;   APPLICATION OF WOUND VAC Left 02/14/2021   Procedure: APPLICATION OF WOUND VAC;  Surgeon: Gwyneth Revels, DPM;  Location: [facility];  Service: Podiatry;  Laterality: Left;   APPLICATION OF WOUND VAC Left 04/16/2021   Procedure: APPLICATION OF WOUND VAC;  Surgeon: Peggye Form, DO;  Location: [facility];  Service: Plastics;  Laterality: Left;   CARDIAC CATHETERIZATION Left 06/04/2009   Procedure: CARDIAC CATHETERIZATION; Location: [facility]   CHOLECYSTECTOMY     DEBRIDEMENT AND CLOSURE WOUND Left 04/16/2021   Procedure: DEBRIDEMENT AND CLOSURE WOUND;  Surgeon: Peggye Form, DO;  Location: [facility];  Service: Plastics;  Laterality: Left;  1  hour   IRRIGATION AND DEBRIDEMENT FOOT Left 02/08/2021   Procedure: IRRIGATION AND DEBRIDEMENT FOOT - LFT HEEL ULCER;  Surgeon: Gwyneth Revels, DPM;  Location: [facility];  Service: Podiatry;  Laterality: Left;   IRRIGATION AND DEBRIDEMENT FOOT Left 02/14/2021   Procedure: IRRIGATION AND DEBRIDEMENT LEFT HEEL;  Surgeon: Gwyneth Revels, DPM;  Location: [facility];  Service: Podiatry;  Laterality: Left;   kidney stone removal     KNEE ARTHROSCOPY W/ MENISCAL REPAIR     NOSE SURGERY  01/07/1989   due  to fracture   TOE AMPUTATION     second toe   TONSILLECTOMY     TOTAL KNEE ARTHROPLASTY  01/11/2011   Procedure: TOTAL KNEE ARTHROPLASTY;  Surgeon: Harvie Junior;  Location: [facility];  Service: Orthopedics;  Laterality: Right;  COMPUTER ASSISTED TOTAL KNEE REPLACEMENT    Allergies  Allergen Reactions   Shellfish Allergy Anaphylaxis   Codeine Other (See Comments)    Migraine  Other Reaction(s): Other (See Comments)  Reaction:  Severe migraines   Morphine And Codeine Other (See Comments)    Reaction:  Severe migraines    Percocet [Oxycodone-Acetaminophen] Nausea And Vomiting   Rosuvastatin     Other reaction(s): Muscle Pain   Penicillins Rash and Other (See Comments)    Is tolerating amoxicillin so no allergies   Sulfa Antibiotics Rash   Theophyllines Itching and Rash    Prior to Admission medications   Medication Sig Start Date End Date Taking? Authorizing Provider  acidophilus (RISAQUAD) CAPS capsule Take 1 capsule by mouth daily.    [provider]  albuterol (PROVENTIL HFA;VENTOLIN HFA) 108 (90 Base) MCG/ACT inhaler Inhale 2 puffs into the lungs every 4 (four) hours as needed for wheezing or shortness of breath. Reported on 03/29/2015 03/29/15   Lorie Phenix, MD  albuterol (PROVENTIL) (2.5 MG/3ML) 0.083% nebulizer solution Take 2.5 mg by nebulization as needed. 12/20/21   [provider]  ALPRAZolam Prudy Feeler) 0.5 MG tablet TAKE 1/2 TO 1 TABLET BY MOUTH AS NEEDED FOR PANIC ATTACKS 06/25/22   Miki Kins, FNP  ascorbic acid (VITAMIN C) 500 MG tablet Take 1 tablet (500 mg total) by mouth 2 (two) times daily. 02/16/21   Glade Lloyd, MD  aspirin EC 81 MG tablet Take 81 mg by mouth daily.    [provider]  aspirin-acetaminophen-caffeine (EXCEDRIN MIGRAINE) [phone] MG tablet Take 2 tablets by mouth every 6 (six) hours as needed for migraine.    [provider]  carvedilol (COREG) 6.25 MG tablet TAKE 1 TABLET BY MOUTH TWICE A DAY 08/19/22    Miki Kins, FNP  Continuous Blood Gluc Sensor (FREESTYLE LIBRE 2 SENSOR) MISC APPLY ONE SENSOR EVERY 2 WEEKS. 03/07/21   [provider]  desvenlafaxine (PRISTIQ) 100 MG 24 hr tablet TAKE 1 TABLET BY MOUTH EVERY DAY 08/19/22   Miki Kins, FNP  Dulaglutide (TRULICITY) 3 MG/0.5ML SOPN INJECT 1 AUTO INJECTOR INTO SKIN ONCE WEEKLY. 05/15/22   Miki Kins, FNP  Dulaglutide (TRULICITY) 4.5 MG/0.5ML SOPN Inject 4.5 mg as directed once a week. 02/13/22   Miki Kins, FNP  fenofibrate 54 MG tablet TAKE 1 TABLET BY MOUTH EVERY DAY 03/11/22   Miki Kins, FNP  fluconazole (DIFLUCAN) 100 MG tablet Take one tablet by mouth on day 1 and second tablet by mouth on day 4 07/19/22   Miki Kins, FNP  ibuprofen (ADVIL,MOTRIN) 800 MG tablet Take 800 mg by mouth every 8 (eight) hours as needed (for  migraines).     [provider]  insulin NPH-regular Human (70-30) 100 UNIT/ML injection Inject 70 Units into the skin.    [provider]  lisinopril (ZESTRIL) 5 MG tablet TAKE 1 TABLET BY MOUTH EVERY DAY 05/24/22   Miki Kins, FNP  metFORMIN (GLUCOPHAGE) 500 MG tablet TAKE TWO TABLETS EACH MORNING. AND 3 TABLETS IN THE EVENING AS DIRECTED 09/17/22   Miki Kins, FNP  montelukast (SINGULAIR) 10 MG tablet TAKE 1 TABLET BY MOUTH EVERYDAY AT BEDTIME 09/17/22   Miki Kins, FNP  nystatin (MYCOSTATIN/NYSTOP) powder APPLY TOPICALLY TWICE DAILY AS NEEDED 04/01/22   Miki Kins, FNP  ondansetron (ZOFRAN-ODT) 4 MG disintegrating tablet Take 4 mg by mouth every 8 (eight) hours as needed. 02/20/21   [provider]  pantoprazole (PROTONIX) 40 MG tablet TAKE 1 TABLET BY MOUTH EVERY DAY 03/12/22   Miki Kins, FNP  pravastatin (PRAVACHOL) 40 MG tablet TAKE 1 TABLET BY MOUTH EVERY DAY 08/23/22   Miki Kins, FNP  SANTYL 250 UNIT/GM ointment Apply topically daily. 12/04/21   [provider]  scopolamine (TRANSDERM-SCOP) 1 MG/3DAYS Place 1  patch (1.5 mg total) onto the skin every 3 (three) days. 07/19/22   Miki Kins, FNP  traMADol (ULTRAM) 50 MG tablet Take 50 mg by mouth every 6 (six) hours as needed (for migraines).    [provider]  zinc sulfate 220 (50 Zn) MG capsule Take 1 capsule (220 mg total) by mouth daily. 02/17/21   Glade Lloyd, MD    Social History   Socioeconomic History   Marital status: Married    Spouse name: Not on file   Number of children: Not on file   Years of education: Not on file   Highest education level: Not on file  Occupational History   Not on file  Tobacco Use   Smoking status: Former    Current packs/day: 0.00    Types: Cigarettes    Quit date: 03/12/1985    Years since quitting: 37.5   Smokeless tobacco: Never  Vaping Use   Vaping status: Never Used  Substance and Sexual Activity   Alcohol use: No   Drug use: No   Sexual activity: Yes    Birth control/protection: Post-menopausal  Other Topics Concern   Not on file  Social History Narrative   Full time. Does not regularly exercise. Domestic partner.    Social Determinants of Health   Financial Resource Strain: Not on file  Food Insecurity: Not on file  Transportation Needs: Not on file  Physical Activity: Not on file  Stress: Not on file  Social Connections: Not on file  Intimate Partner Violence: Not on file     Family History  Problem Relation Age of Onset   Transient ischemic attack Mother    Asthma Mother    Hypertension Mother    Lung cancer Father    Hypertension Father    Asthma Sister    Anxiety disorder Sister    Stomach cancer Maternal Grandmother    Colon cancer Maternal Grandmother    Leukemia Maternal Grandmother    Melanoma Maternal Grandfather    Asthma Sister    Hypertension Brother    Cancer Other    Crohn's disease Other    GER disease Other    Breast cancer Neg Hx     ROS: Otherwise negative unless mentioned in HPI  Physical Examination  Vitals:   09/18/22 1345   BP: (!) 179/71  Pulse: 97  Resp: 18  Temp: 99.3 F (37.4 C)  SpO2: 95%   Body mass index is 40.46 kg/m.  General:  WDWN in NAD Gait: Not observed HENT: WNL, normocephalic Pulmonary: normal non-labored breathing, without Rales, rhonchi,  wheezing Cardiac: regular, without  Murmurs, rubs or gallops; without carotid bruits Abdomen: Positive bowel sounds,  soft, NT/ND, no masses Skin: without rashes Vascular Exam/Pulses: Non palpable PT and DP pulses to left foot due to +2 edema. Left lower extremity warm to touch.  Extremities: without ischemic changes, with Gangrene , with cellulitis; with open wounds;  Musculoskeletal: no muscle wasting or atrophy  Neurologic: A&O X 3;  No focal weakness or paresthesias are detected; speech is fluent/normal Psychiatric:  The pt has Normal affect. Lymph:  Unremarkable  CBC    Component Value Date/Time   WBC 19.9 (H) 09/18/2022 1348   RBC 3.68 (L) 09/18/2022 1348   HGB 10.5 (L) 09/18/2022 1348   HGB 12.3 08/07/2022 1210   HCT 33.8 (L) 09/18/2022 1348   HCT 38.6 08/07/2022 1210   PLT 460 (H) 09/18/2022 1348   PLT 320 08/07/2022 1210   MCV 91.8 09/18/2022 1348   MCV 90 08/07/2022 1210   MCV 90 08/21/2012 1523   MCH 28.5 09/18/2022 1348   MCHC 31.1 09/18/2022 1348   RDW 13.1 09/18/2022 1348   RDW 11.8 08/07/2022 1210   RDW 13.4 08/21/2012 1523   LYMPHSABS 2.4 08/07/2022 1210   LYMPHSABS 2.7 06/05/2011 0550   MONOABS 0.9 02/13/2021 0323   MONOABS 0.4 06/05/2011 0550   EOSABS 0.5 (H) 08/07/2022 1210   EOSABS 0.6 06/05/2011 0550   BASOSABS 0.1 08/07/2022 1210   BASOSABS 0.1 06/05/2011 0550    BMET    Component Value Date/Time   NA 130 (L) 09/18/2022 1348   NA 132 (L) 08/07/2022 1210   NA 133 (L) 08/21/2012 1523   K 5.0 09/18/2022 1348   K 4.1 08/21/2012 1523   CL 94 (L) 09/18/2022 1348   CL 102 08/21/2012 1523   CO2 23 09/18/2022 1348   CO2 26 08/21/2012 1523   GLUCOSE 336 (H) 09/18/2022 1348   GLUCOSE 306 (H) 08/21/2012  1523   BUN 28 (H) 09/18/2022 1348   BUN 23 08/07/2022 1210   BUN 17 08/21/2012 1523   CREATININE 0.97 09/18/2022 1348   CREATININE 0.69 08/21/2012 1523   CALCIUM 9.0 09/18/2022 1348   CALCIUM 8.8 08/21/2012 1523   GFRNONAA >60 09/18/2022 1348   GFRNONAA >60 08/21/2012 1523   GFRAA 117 11/04/2016 1109   GFRAA >60 08/21/2012 1523    COAGS: Lab Results  Component Value Date   INR 1.2 02/07/2021   INR 1.21 03/13/2015   INR 1.34 01/13/2011     Non-Invasive Vascular Imaging:   Exrays of left foot ordered.   Statin:  No. Beta Blocker:  Yes.   Aspirin:  Yes.   ACEI:  No. ARB:  No. CCB use:  No Other antiplatelets/anticoagulants:  No.    ASSESSMENT/PLAN: This is a 57 y.o. female with History of diabetic ulcer to her left heel but has noticed worsening pain and skin changes over the past several days. New redness has developed over the past 3 days extending up her leg. She started having fevers and nausea as well. Follows with podiatry for this wound.   PLAN: Administer IV antibiotics over night and re-evaluate first thing in the morning. Plan is for possible Guillotine amputation of left lower extremity tomorrow but if responding well to  antibiotics will plan for left below the knee amputation on Friday 09/20/2022.    -I discussed in detail the plan with Dr Vilinda Flake MD and he is in agreement with the plan.    Marcie Bal [facility] 09/18/2022 4:45 PM

## 2022-09-18 NOTE — Assessment & Plan Note (Signed)
Meeting sepsis criteria by temperature 101.2, white count 20  Noted concurrent left diabetic foot infection with concern for gas gangrene Lactate pending Blood pressure reassuring Pancultured IV cefepime, Flagyl and vancomycin for diabetic foot coverage Otherwise monitor closely

## 2022-09-18 NOTE — Consult Note (Signed)
Pharmacy Antibiotic Note  Nancy [name] is a 57 y.o. female admitted on 09/18/2022 with  lower extremity wound infection .  Pharmacy has been consulted for vancomycin and cefepime dosing.  Plan: Vancomycin 2500 mg IV x 1 bolus ordered Start vancomycin 1750 mg IV every 24 hours Estimated AUC 470, Cmin 9.9 IBW, Scr 0.97, Vd 0.5 (BMI 40) Start cefepime 2 grams IV every 8 hours Flagyl 500 mg IV every 12 hours per provider Vancomycin levels at steady state Follow renal function for adjustments  Height: 5\' 9"  (175.3 cm) Weight: 124.3 kg (274 lb) IBW/kg (Calculated) : 66.2  Temp (24hrs), Avg:100.2 F (37.9 C), Min:99.3 F (37.4 C), Max:101.2 F (38.4 C)  Recent Labs  Lab 09/18/22 1348  WBC 19.9*  CREATININE 0.97    Estimated Creatinine Clearance: 90.3 mL/min (by C-G formula based on SCr of 0.97 mg/dL).    Allergies  Allergen Reactions   Shellfish Allergy Anaphylaxis   Codeine Other (See Comments)    Migraine  Other Reaction(s): Other (See Comments)  Reaction:  Severe migraines   Morphine And Codeine Other (See Comments)    Reaction:  Severe migraines    Percocet [Oxycodone-Acetaminophen] Nausea And Vomiting   Rosuvastatin     Other reaction(s): Muscle Pain   Sulfa Antibiotics Rash   Theophyllines Itching and Rash    Antimicrobials this admission: vancomycin 9/11 >>  cefepime 9/11 >>  Flagyl 9/11 >>  Dose adjustments this admission: N/A  Microbiology results: 9/11 BCx: pending 9/11 Wound cx: pending  Thank you for allowing pharmacy to be a part of this patient's care.  Barrie Folk, PharmD 09/18/2022 5:48 PM

## 2022-09-18 NOTE — Assessment & Plan Note (Signed)
Baseline history of transient Takotsubo cardiomyopathy Appears euvolemic at present No active chest pain Monitor

## 2022-09-18 NOTE — ED Triage Notes (Signed)
Pt to ED for multiple complaints. C/o left leg swelling x3 days, reports has wound to left leg for past 2 years.  C/o nausea for the past few days. Also reports blood in stool today, first BM in one week.  NAD noted

## 2022-09-18 NOTE — ED Notes (Signed)
Dr. Excell Seltzer- use bedadine soaked gauze and wrap

## 2022-09-18 NOTE — H&P (Addendum)
History and Physical    Patient: Nancy [name] QMV:[id number] DOB: April 29, 1965 DOA: 09/18/2022 DOS: the patient was seen and examined on 09/18/2022 PCP: Miki Kins, FNP  Patient coming from: Home  Chief Complaint:  Chief Complaint  Patient presents with   multiple complaints   HPI: Nancy [name] is a 57 y.o. female with medical history significant of type 2 diabetes, CAD, asthma, chronic pain, diabetic foot infection, GERD, hyperlipidemia, Takotsubo cardiomyopathy presenting with sepsis, diabetic foot infection.  Patient reports worsening left foot pain over the past 2 to 3 days.  Patient is noted to have issues with the same leg for at least the past 2- 3 years. Feb 2023 admission for osteomyelitis  Wound cultures grew Enterococcus faecalis and Staph epidermidis s/p course of abx and outpt ID, plastic surgery and podiatry follow.   Had a left fifth toe amputation 2017. Has had multiple wound vacs placed to the affected area since around April of this year.  Significant worsening pain as noted acutely.  Positive fevers and nausea.Marland Kitchen  Positive generalized malaise.  Blood sugars have been poorly controlled.  Worsening redness and swelling. Presented to ER Tmax 101.2, heart rate 90s, respiration stable, blood pressure 160s to 170s over 60s to 70s.  Satting well on room air.  White count 20, hemoglobin 10.5, platelets 460, sed rate 80, creatinine 0.97 glucose 300s.  Pancultured in the ER.  Lactate pending.  Left heel imaging with posterior heel ulcer with large amount of gas with the heel pad and posterior heel consistent with heel ulcer and necrotic infection.  Positive osteomyelitic changes.  Podiatry and vascular surgery formally consulted at the bedside. Review of Systems: As mentioned in the history of present illness. All other systems reviewed and are negative. Past Medical History:  Diagnosis Date   Abnormal cardiovascular stress test 09/21/2018   Formatting of this note might  be different from the original. Lexiscan Myoview 09/16/2018 revealed mild anterior ischemia   Adverse effect of motion 05/10/2014   Anxiety    Arthritis    r knee   Asthma    Breast cyst 05/10/2014   CAD (coronary artery disease)    a.) LHC 06/04/2009 at [facility]; non-obstructive CAD. b.) CTA with FFR 10/08/2018: extensive mixed plaque proximal to mid LAD (51-69%); Coronary Ca score 217; FFR 0.71 dPDA, 0.86 mLAD, 0.87 dLCx.   CCF (congestive cardiac failure) (HCC) 06/06/2008   a.) 30% EF. b.) TTE 06/03/2011: EF >55%; triv MR. c.) TTE 04/27/2018: EF 55%; mild LVH; triv PR, mild MR/TR; G1DD.   Chest pain with high risk for cardiac etiology 07/02/2016   Chronic use of opiate drug for therapeutic purpose 02/13/2022   Formatting of this note might be different from the original. [facility] signed on 04/18/16 & updated 03/04/17; UDS done on 04/18/16.   Complication of anesthesia    Diabetic foot infection (HCC) 03/13/2015   Diabetic foot ulcer (HCC) 02/08/2021   Eczema    Family history of adverse reaction to anesthesia    a.) PONV in mother and grandmother   GERD (gastroesophageal reflux disease) 05/10/2014   History of kidney stones    HLD (hyperlipidemia)    Hyponatremia 03/13/2015   Migraines    Motion sickness    Other acute osteomyelitis, left ankle and foot (HCC) 02/13/2022   Panic attacks    Peripheral edema 04/27/2018   Pneumonia    PONV (postoperative nausea and vomiting)    Sepsis secondary to diabetic foot infection 03/13/2015  T2DM (type 2 diabetes mellitus) (HCC)    Takotsubo cardiomyopathy / transient apical balooning syndrome / stress-induced cardiomyopathy 05/10/2014   Unspecified essential hypertension    Past Surgical History:  Procedure Laterality Date   AMPUTATION TOE Left 03/16/2015   Procedure: left fifth toe amputation with incision and drainage;  Surgeon: Gwyneth Revels, DPM;  Location: [facility];  Service: Podiatry;  Laterality: Left;   APPLICATION OF WOUND VAC  Left 02/14/2021   Procedure: APPLICATION OF WOUND VAC;  Surgeon: Gwyneth Revels, DPM;  Location: [facility];  Service: Podiatry;  Laterality: Left;   APPLICATION OF WOUND VAC Left 04/16/2021   Procedure: APPLICATION OF WOUND VAC;  Surgeon: Peggye Form, DO;  Location: [facility];  Service: Plastics;  Laterality: Left;   CARDIAC CATHETERIZATION Left 06/04/2009   Procedure: CARDIAC CATHETERIZATION; Location: [facility]   CHOLECYSTECTOMY     DEBRIDEMENT AND CLOSURE WOUND Left 04/16/2021   Procedure: DEBRIDEMENT AND CLOSURE WOUND;  Surgeon: Peggye Form, DO;  Location:  SURGERY CENTER;  Service: Plastics;  Laterality: Left;  1 hour   IRRIGATION AND DEBRIDEMENT FOOT Left 02/08/2021   Procedure: IRRIGATION AND DEBRIDEMENT FOOT - LFT HEEL ULCER;  Surgeon: Gwyneth Revels, DPM;  Location: [facility];  Service: Podiatry;  Laterality: Left;   IRRIGATION AND DEBRIDEMENT FOOT Left 02/14/2021   Procedure: IRRIGATION AND DEBRIDEMENT LEFT HEEL;  Surgeon: Gwyneth Revels, DPM;  Location: [facility];  Service: Podiatry;  Laterality: Left;   kidney stone removal     KNEE ARTHROSCOPY W/ MENISCAL REPAIR     NOSE SURGERY  01/07/1989   due to fracture   TOE AMPUTATION     second toe   TONSILLECTOMY     TOTAL KNEE ARTHROPLASTY  01/11/2011   Procedure: TOTAL KNEE ARTHROPLASTY;  Surgeon: Harvie Junior;  Location: [facility];  Service: Orthopedics;  Laterality: Right;  COMPUTER ASSISTED TOTAL KNEE REPLACEMENT   Social History:  reports that she quit smoking about 37 years ago. Her smoking use included cigarettes. She has never used smokeless tobacco. She reports that she does not drink alcohol and does not use drugs.  Allergies  Allergen Reactions   Shellfish Allergy Anaphylaxis   Codeine Other (See Comments)    Migraine  Other Reaction(s): Other (See Comments)  Reaction:  Severe migraines   Morphine And Codeine Other (See Comments)    Reaction:  Severe migraines     Percocet [Oxycodone-Acetaminophen] Nausea And Vomiting   Rosuvastatin     Other reaction(s): Muscle Pain   Sulfa Antibiotics Rash   Theophyllines Itching and Rash    Family History  Problem Relation Age of Onset   Transient ischemic attack Mother    Asthma Mother    Hypertension Mother    Lung cancer Father    Hypertension Father    Asthma Sister    Anxiety disorder Sister    Stomach cancer Maternal Grandmother    Colon cancer Maternal Grandmother    Leukemia Maternal Grandmother    Melanoma Maternal Grandfather    Asthma Sister    Hypertension Brother    Cancer Other    Crohn's disease Other    GER disease Other    Breast cancer Neg Hx     Prior to Admission medications   Medication Sig Start Date End Date Taking? Authorizing Provider  acidophilus (RISAQUAD) CAPS capsule Take 1 capsule by mouth daily.   Yes [provider]  albuterol (PROVENTIL HFA;VENTOLIN HFA) 108 (90 Base) MCG/ACT inhaler Inhale 2  puffs into the lungs every 4 (four) hours as needed for wheezing or shortness of breath. Reported on 03/29/2015 03/29/15  Yes Lorie Phenix, MD  albuterol (PROVENTIL) (2.5 MG/3ML) 0.083% nebulizer solution Take 2.5 mg by nebulization as needed. 12/20/21  Yes [provider]  ALPRAZolam Prudy Feeler) 0.5 MG tablet TAKE 1/2 TO 1 TABLET BY MOUTH AS NEEDED FOR PANIC ATTACKS 06/25/22  Yes Miki Kins, FNP  ascorbic acid (VITAMIN C) 500 MG tablet Take 1 tablet (500 mg total) by mouth 2 (two) times daily. 02/16/21  Yes Glade Lloyd, MD  aspirin EC 81 MG tablet Take 81 mg by mouth daily.   Yes [provider]  aspirin-acetaminophen-caffeine (EXCEDRIN MIGRAINE) 412 121 4279 MG tablet Take 2 tablets by mouth every 6 (six) hours as needed for migraine.   Yes [provider]  carvedilol (COREG) 6.25 MG tablet TAKE 1 TABLET BY MOUTH TWICE A DAY 08/19/22  Yes Miki Kins, FNP  desvenlafaxine (PRISTIQ) 100 MG 24 hr tablet TAKE 1 TABLET BY MOUTH EVERY DAY  08/19/22  Yes Miki Kins, FNP  fenofibrate 54 MG tablet TAKE 1 TABLET BY MOUTH EVERY DAY 03/11/22  Yes Miki Kins, FNP  ibuprofen (ADVIL,MOTRIN) 800 MG tablet Take 800 mg by mouth every 8 (eight) hours as needed (for migraines).    Yes [provider]  insulin NPH-regular Human (70-30) 100 UNIT/ML injection Inject 50 Units into the skin 2 (two) times daily with a meal.   Yes [provider]  lisinopril (ZESTRIL) 5 MG tablet TAKE 1 TABLET BY MOUTH EVERY DAY Patient taking differently: Take 5 mg by mouth 2 (two) times daily. 05/24/22  Yes Miki Kins, FNP  metFORMIN (GLUCOPHAGE) 500 MG tablet TAKE TWO TABLETS EACH MORNING. AND 3 TABLETS IN THE EVENING AS DIRECTED 09/17/22  Yes Miki Kins, FNP  montelukast (SINGULAIR) 10 MG tablet TAKE 1 TABLET BY MOUTH EVERYDAY AT BEDTIME 09/17/22  Yes Miki Kins, FNP  nystatin (MYCOSTATIN/NYSTOP) powder APPLY TOPICALLY TWICE DAILY AS NEEDED 04/01/22  Yes Miki Kins, FNP  ondansetron (ZOFRAN-ODT) 4 MG disintegrating tablet Take 4 mg by mouth every 8 (eight) hours as needed. 02/20/21  Yes [provider]  pantoprazole (PROTONIX) 40 MG tablet TAKE 1 TABLET BY MOUTH EVERY DAY 03/12/22  Yes Miki Kins, FNP  pravastatin (PRAVACHOL) 40 MG tablet TAKE 1 TABLET BY MOUTH EVERY DAY 08/23/22  Yes Miki Kins, FNP  traMADol (ULTRAM) 50 MG tablet Take 50 mg by mouth every 6 (six) hours as needed (for migraines).   Yes [provider]  zinc sulfate 220 (50 Zn) MG capsule Take 1 capsule (220 mg total) by mouth daily. 02/17/21  Yes Glade Lloyd, MD  Continuous Blood Gluc Sensor (FREESTYLE LIBRE 2 SENSOR) MISC APPLY ONE SENSOR EVERY 2 WEEKS. 03/07/21   [provider]  Dulaglutide (TRULICITY) 3 MG/0.5ML SOPN INJECT 1 AUTO INJECTOR INTO SKIN ONCE WEEKLY. Patient not taking: Reported on 09/18/2022 05/15/22   Miki Kins, FNP  Dulaglutide (TRULICITY) 4.5 MG/0.5ML SOPN Inject 4.5 mg as directed once  a week. Patient not taking: Reported on 09/18/2022 02/13/22   Miki Kins, FNP  fluconazole (DIFLUCAN) 100 MG tablet Take one tablet by mouth on day 1 and second tablet by mouth on day 4 Patient not taking: Reported on 09/18/2022 07/19/22   Miki Kins, FNP  SANTYL 250 UNIT/GM ointment Apply topically daily. Patient not taking: Reported on 09/18/2022 12/04/21   [provider]  scopolamine (TRANSDERM-SCOP)  1 MG/3DAYS Place 1 patch (1.5 mg total) onto the skin every 3 (three) days. Patient not taking: Reported on 09/18/2022 07/19/22   Miki Kins, FNP    Physical Exam: Vitals:   09/18/22 1345 09/18/22 1346 09/18/22 1441 09/18/22 1731  BP: (!) 179/71   (!) 163/62  Pulse: 97   99  Resp: 18   18  Temp: 99.3 F (37.4 C)  100.1 F (37.8 C) (!) 101.2 F (38.4 C)  TempSrc:   Oral Oral  SpO2: 95%   100%  Weight:  124.3 kg    Height:  5\' 9"  (1.753 m)     Physical Exam Constitutional:      Appearance: She is obese.  HENT:     Head: Normocephalic.     Nose: Nose normal.     Mouth/Throat:     Mouth: Mucous membranes are moist.  Eyes:     Pupils: Pupils are equal, round, and reactive to light.  Cardiovascular:     Rate and Rhythm: Normal rate.     Pulses: Normal pulses.     Heart sounds: Normal heart sounds.  Abdominal:     General: Abdomen is flat.  Skin:    Comments: See pictures    Neurological:     General: No focal deficit present.  Psychiatric:        Mood and Affect: Mood normal.       Data Reviewed:  There are no new results to review at this time. DG Foot Complete Left CLINICAL DATA:  Heel ulcer  EXAM: LEFT FOOT - COMPLETE 3+ VIEW  COMPARISON:  02/07/2021 radiographs of the ankle  FINDINGS: Extensive vascular calcifications. Posterior heel ulcer with large amount of gas within the heel pad and posterior heel. Possible cortical bone resorption posterior calcaneal margin on lateral view. Patient is status post partial amputation of the  second digit at the level of head of proximal phalanx and transmetatarsal amputation of fifth digit. Degenerative changes at the first MTP joint.  IMPRESSION: Posterior heel ulcer with large amount of gas within the heel pad and posterior heel consistent with heel ulcer and necrotic infection. Possible cortical resorption/subtle osteomyelitis changes posterior calcaneus deep to the heel ulcer  Electronically Signed   By: Jasmine Pang M.D.   On: 09/18/2022 17:17  Lab Results  Component Value Date   WBC 19.9 (H) 09/18/2022   HGB 10.5 (L) 09/18/2022   HCT 33.8 (L) 09/18/2022   MCV 91.8 09/18/2022   PLT 460 (H) 09/18/2022   Last metabolic panel Lab Results  Component Value Date   GLUCOSE 336 (H) 09/18/2022   NA 130 (L) 09/18/2022   K 5.0 09/18/2022   CL 94 (L) 09/18/2022   CO2 23 09/18/2022   BUN 28 (H) 09/18/2022   CREATININE 0.97 09/18/2022   GFRNONAA >60 09/18/2022   CALCIUM 9.0 09/18/2022   PROT 7.8 09/18/2022   ALBUMIN 2.9 (L) 09/18/2022   LABGLOB 3.0 08/07/2022   AGRATIO 1.3 05/15/2022   BILITOT 1.0 09/18/2022   ALKPHOS 101 09/18/2022   AST 20 09/18/2022   ALT 21 09/18/2022   ANIONGAP 13 09/18/2022    Assessment and Plan: Osteomyelitis (HCC) Worsening L heel pain w/ gas gangrene secondary to posterior heel ulceration with osteomyelitis on imaging  Vascular surgery and podiatry has evaluated the patient w/ plan for likely BKA vs. AKA pending 48 hours of IV antibiotics  Will place on IV cefepime, flagyl and vancomycin for infectious coverage  Blood cultures drawn  Follow up formal podiatry and vascular surgery recommendations    Sepsis secondary to diabetic foot infection Meeting sepsis criteria by temperature 101.2, white count 20  Noted concurrent left diabetic foot infection with concern for gas gangrene Lactate pending Blood pressure reassuring Pancultured IV cefepime, Flagyl and vancomycin for diabetic foot coverage Otherwise monitor  closely  Diabetes mellitus, type 2 (HCC) Poorly controlled Blood sugar in 300s Sliding-scale insulin A1c Monitor  Takotsubo cardiomyopathy Baseline history of transient Takotsubo cardiomyopathy Appears euvolemic at present No active chest pain Monitor  Mixed hyperlipidemia Stable Continue statin  Asthma without status asthmaticus Stable from respiratory standpoint Continue home inhalers  Essential hypertension BP stable Titrate home regimen    Greater than 50% was spent in counseling and coordination of care with patient Total encounter time 80 minutes or more   Advance Care Planning:   Code Status: Full Code   Consults: Vascular and Podiatry   Family Communication: Partner at the bedside   Severity of Illness: The appropriate patient status for this patient is INPATIENT. Inpatient status is judged to be reasonable and necessary in order to provide the required intensity of service to ensure the patient's safety. The patient's presenting symptoms, physical exam findings, and initial radiographic and laboratory data in the context of their chronic comorbidities is felt to place them at high risk for further clinical deterioration. Furthermore, it is not anticipated that the patient will be medically stable for discharge from the hospital within 2 midnights of admission.   * I certify that at the point of admission it is my clinical judgment that the patient will require inpatient hospital care spanning beyond 2 midnights from the point of admission due to high intensity of service, high risk for further deterioration and high frequency of surveillance required.*  Author: Floydene Flock, MD 09/18/2022 5:50 PM  For on call review [email or web address].

## 2022-09-18 NOTE — ED Notes (Signed)
Informed RN bed assigned 

## 2022-09-18 NOTE — Assessment & Plan Note (Signed)
BP stable Titrate home regimen 

## 2022-09-18 NOTE — Consult Note (Signed)
PODIATRY / FOOT AND ANKLE SURGERY CONSULTATION NOTE  Requesting Physician: Dr. Anner Crete  Reason for consult: L foot infection  Chief Complaint: Left foot wound with infection   HPI: Nancy [name] is a 57 y.o. female who presents with a nonhealing ulcer to the posterior aspect of the left heel.  Patient has had this wound now for a number of years.  She has seen Dr. Ether Griffins myself as well as plastic surgery where she has had multiple surgeries performed with wound VAC therapy and different dressings to try to help heal the wound.  She has been feeling sick now for the past couple of days to almost a week now.  She was not sure if it was related to her foot but she started to have more pain to the foot that was extending from the foot up to the hip and knee.  She has not been able to eat or drink anything much over the past several days now.  Patient is not complaining of much pain currently and presents with partner at bedside.  PMHx:  Past Medical History:  Diagnosis Date   Abnormal cardiovascular stress test 09/21/2018   Formatting of this note might be different from the original. Lexiscan Myoview 09/16/2018 revealed mild anterior ischemia   Adverse effect of motion 05/10/2014   Anxiety    Arthritis    r knee   Asthma    Breast cyst 05/10/2014   CAD (coronary artery disease)    a.) LHC 06/04/2009 at [facility]; non-obstructive CAD. b.) CTA with FFR 10/08/2018: extensive mixed plaque proximal to mid LAD (51-69%); Coronary Ca score 217; FFR 0.71 dPDA, 0.86 mLAD, 0.87 dLCx.   CCF (congestive cardiac failure) (HCC) 06/06/2008   a.) 30% EF. b.) TTE 06/03/2011: EF >55%; triv MR. c.) TTE 04/27/2018: EF 55%; mild LVH; triv PR, mild MR/TR; G1DD.   Chest pain with high risk for cardiac etiology 07/02/2016   Chronic use of opiate drug for therapeutic purpose 02/13/2022   Formatting of this note might be different from the original. [facility] Pain Contract signed on 04/18/16 & updated 03/04/17; UDS done on 04/18/16.    Complication of anesthesia    Diabetic foot infection (HCC) 03/13/2015   Diabetic foot ulcer (HCC) 02/08/2021   Eczema    Family history of adverse reaction to anesthesia    a.) PONV in mother and grandmother   GERD (gastroesophageal reflux disease) 05/10/2014   History of kidney stones    HLD (hyperlipidemia)    Hyponatremia 03/13/2015   Migraines    Motion sickness    Other acute osteomyelitis, left ankle and foot (HCC) 02/13/2022   Panic attacks    Peripheral edema 04/27/2018   Pneumonia    PONV (postoperative nausea and vomiting)    Sepsis secondary to diabetic foot infection 03/13/2015   T2DM (type 2 diabetes mellitus) (HCC)    Takotsubo cardiomyopathy / transient apical balooning syndrome / stress-induced cardiomyopathy 05/10/2014   Unspecified essential hypertension     Surgical Hx:  Past Surgical History:  Procedure Laterality Date   AMPUTATION TOE Left 03/16/2015   Procedure: left fifth toe amputation with incision and drainage;  Surgeon: Gwyneth Revels, DPM;  Location: [facility];  Service: Podiatry;  Laterality: Left;   APPLICATION OF WOUND VAC Left 02/14/2021   Procedure: APPLICATION OF WOUND VAC;  Surgeon: Gwyneth Revels, DPM;  Location: [facility];  Service: Podiatry;  Laterality: Left;   APPLICATION OF WOUND VAC Left 04/16/2021   Procedure: APPLICATION OF WOUND VAC;  Surgeon: Peggye Form, DO;  Location: [facility];  Service: Plastics;  Laterality: Left;   CARDIAC CATHETERIZATION Left 06/04/2009   Procedure: CARDIAC CATHETERIZATION; Location: [facility]   CHOLECYSTECTOMY     DEBRIDEMENT AND CLOSURE WOUND Left 04/16/2021   Procedure: DEBRIDEMENT AND CLOSURE WOUND;  Surgeon: Peggye Form, DO;  Location: [facility];  Service: Plastics;  Laterality: Left;  1 hour   IRRIGATION AND DEBRIDEMENT FOOT Left 02/08/2021   Procedure: IRRIGATION AND DEBRIDEMENT FOOT - LFT HEEL ULCER;  Surgeon: Gwyneth Revels, DPM;  Location:  [facility];  Service: Podiatry;  Laterality: Left;   IRRIGATION AND DEBRIDEMENT FOOT Left 02/14/2021   Procedure: IRRIGATION AND DEBRIDEMENT LEFT HEEL;  Surgeon: Gwyneth Revels, DPM;  Location: [facility];  Service: Podiatry;  Laterality: Left;   kidney stone removal     KNEE ARTHROSCOPY W/ MENISCAL REPAIR     NOSE SURGERY  01/07/1989   due to fracture   TOE AMPUTATION     second toe   TONSILLECTOMY     TOTAL KNEE ARTHROPLASTY  01/11/2011   Procedure: TOTAL KNEE ARTHROPLASTY;  Surgeon: Harvie Junior;  Location: [facility];  Service: Orthopedics;  Laterality: Right;  COMPUTER ASSISTED TOTAL KNEE REPLACEMENT    FHx:  Family History  Problem Relation Age of Onset   Transient ischemic attack Mother    Asthma Mother    Hypertension Mother    Lung cancer Father    Hypertension Father    Asthma Sister    Anxiety disorder Sister    Stomach cancer Maternal Grandmother    Colon cancer Maternal Grandmother    Leukemia Maternal Grandmother    Melanoma Maternal Grandfather    Asthma Sister    Hypertension Brother    Cancer Other    Crohn's disease Other    GER disease Other    Breast cancer Neg Hx     Social History:  reports that she quit smoking about 37 years ago. Her smoking use included cigarettes. She has never used smokeless tobacco. She reports that she does not drink alcohol and does not use drugs.  Allergies:  Allergies  Allergen Reactions   Shellfish Allergy Anaphylaxis   Codeine Other (See Comments)    Migraine  Other Reaction(s): Other (See Comments)  Reaction:  Severe migraines   Morphine And Codeine Other (See Comments)    Reaction:  Severe migraines    Percocet [Oxycodone-Acetaminophen] Nausea And Vomiting   Rosuvastatin     Other reaction(s): Muscle Pain   Penicillins Rash and Other (See Comments)    Is tolerating amoxicillin so no allergies   Sulfa Antibiotics Rash   Theophyllines Itching and Rash   (Not in a hospital admission)   Physical Exam: General: Alert  and oriented.  No apparent distress.  Vascular: DP/PT pulses faintly palpable bilateral.  No hair growth noted to digits bilaterally.  Capillary fill time appears to be intact to digits bilaterally.  Moderate to severe erythema and edema present to the left rear foot and ankle.  Neuro: Light touch sensation reduced to bilateral lower extremities.  Derm: Open ulceration of the posterior aspect the left heel that probes to subcutaneous tissue, blister type formation present to the medial instep extending to the heel with dark discoloration of tissue, malodor consistent with likely gaseous bacteria, associated erythema and edema that appears to be moderate to severe.      MSK: Left partial calcaneal resection  Results for orders placed or performed during  the hospital encounter of 09/18/22 (from the past 48 hour(s))  CBC     Status: Abnormal   Collection Time: 09/18/22  1:48 PM  Result Value Ref Range   WBC 19.9 (H) 4.0 - 10.5 K/uL   RBC 3.68 (L) 3.87 - 5.11 MIL/uL   Hemoglobin 10.5 (L) 12.0 - 15.0 g/dL   HCT 16.1 (L) 09.6 - 04.5 %   MCV 91.8 80.0 - 100.0 fL   MCH 28.5 26.0 - 34.0 pg   MCHC 31.1 30.0 - 36.0 g/dL   RDW 40.9 81.1 - 91.4 %   Platelets 460 (H) 150 - 400 K/uL   nRBC 0.0 0.0 - 0.2 %    Comment: Performed at [facility], [address] [place], [place] [address]  Comprehensive metabolic panel     Status: Abnormal   Collection Time: 09/18/22  1:48 PM  Result Value Ref Range   Sodium 130 (L) 135 - 145 mmol/L   Potassium 5.0 3.5 - 5.1 mmol/L   Chloride 94 (L) 98 - 111 mmol/L   CO2 23 22 - 32 mmol/L   Glucose, Bld 336 (H) 70 - 99 mg/dL    Comment: Glucose reference range applies only to samples taken after fasting for at least 8 hours.   BUN 28 (H) 6 - 20 mg/dL   Creatinine, Ser 6.21 0.44 - 1.00 mg/dL   Calcium 9.0 8.9 - 30.8 mg/dL   Total Protein 7.8 6.5 - 8.1 g/dL   Albumin 2.9 (L) 3.5 - 5.0 g/dL   AST 20 15 - 41 U/L   ALT 21 0 - 44 U/L   Alkaline  Phosphatase 101 38 - 126 U/L   Total Bilirubin 1.0 0.3 - 1.2 mg/dL   GFR, Estimated >65 >78 mL/min    Comment: (NOTE) Calculated using the CKD-EPI Creatinine Equation (2021)    Anion gap 13 5 - 15    Comment: Performed at [facility], [address] [place], [place] [address]  Lipase, blood     Status: None   Collection Time: 09/18/22  1:48 PM  Result Value Ref Range   Lipase 19 11 - 51 U/L    Comment: Performed at [facility], [address] [place], [place] [address]  Sedimentation rate     Status: Abnormal   Collection Time: 09/18/22  3:54 PM  Result Value Ref Range   Sed Rate 80 (H) 0 - 30 mm/hr    Comment: Performed at [facility], [address] [place], [place] [address]   No results found.  Blood pressure (!) 179/71, pulse 97, temperature 99.3 F (37.4 C), resp. rate 18, height 5\' 9"  (1.753 m), weight 124.3 kg, last menstrual period 02/08/2013, SpO2 95%.  Assessment Sepsis related to gas gangrene secondary to posterior heel ulceration with osteomyelitis Diabetes type 2 polyneuropathy PVD  Plan -Patient seen and examined. -X-ray imaging reviewed and discussed with patient showing gas within the tissues of the rear foot as well as vascular calcification and signs consistent with osteomyelitis of the calcaneus posteriorly around the posterior tuber. -Remove some of the blister type material present around the wound and medial heel exposing the gangrenous/necrotic skin extending from the mid arch area to the posterior lateral heel, malodorous consistent with gas gangrene. -All treatment options were discussed with the patient but at this time believe that the limb is nonsalvageable due to the state of infection.  Believe that patient will be best served with below-knee amputation to get rid  of the necrotizing infection.  Discussed with vascular.  They are agreeable.  Patient is also agreeable.  Plan for surgery at some point within the next  day.  Discussed with vascular, Dr. Gilda Crease to perform at some point soon. -For now recommend Betadine wet-to-dry dressings. -Appreciate medicine recommendations for antibiotic therapy.  Wound culture taken and sent off.  Podiatry team to sign off at this time.  Rosetta Posner, DPM 09/18/2022, 4:45 PM

## 2022-09-19 ENCOUNTER — Inpatient Hospital Stay: Payer: 59 | Admitting: Anesthesiology

## 2022-09-19 ENCOUNTER — Encounter
Admission: EM | Disposition: A | Discharge status: Home Health | Payer: Self-pay | Source: Home / Self Care | Attending: Internal Medicine

## 2022-09-19 ENCOUNTER — Other Ambulatory Visit: Payer: Self-pay

## 2022-09-19 DIAGNOSIS — E1100 Type 2 diabetes mellitus with hyperosmolarity without nonketotic hyperglycemic-hyperosmolar coma (NKHHC): Secondary | ICD-10-CM | POA: Diagnosis not present

## 2022-09-19 DIAGNOSIS — E11621 Type 2 diabetes mellitus with foot ulcer: Secondary | ICD-10-CM

## 2022-09-19 DIAGNOSIS — L089 Local infection of the skin and subcutaneous tissue, unspecified: Secondary | ICD-10-CM

## 2022-09-19 DIAGNOSIS — M86472 Chronic osteomyelitis with draining sinus, left ankle and foot: Secondary | ICD-10-CM

## 2022-09-19 DIAGNOSIS — M868X7 Other osteomyelitis, ankle and foot: Secondary | ICD-10-CM | POA: Diagnosis not present

## 2022-09-19 DIAGNOSIS — L03116 Cellulitis of left lower limb: Secondary | ICD-10-CM | POA: Diagnosis not present

## 2022-09-19 DIAGNOSIS — J449 Chronic obstructive pulmonary disease, unspecified: Secondary | ICD-10-CM

## 2022-09-19 DIAGNOSIS — Z89422 Acquired absence of other left toe(s): Secondary | ICD-10-CM

## 2022-09-19 DIAGNOSIS — A419 Sepsis, unspecified organism: Secondary | ICD-10-CM | POA: Diagnosis not present

## 2022-09-19 DIAGNOSIS — I10 Essential (primary) hypertension: Secondary | ICD-10-CM

## 2022-09-19 DIAGNOSIS — I5181 Takotsubo syndrome: Secondary | ICD-10-CM

## 2022-09-19 DIAGNOSIS — I96 Gangrene, not elsewhere classified: Secondary | ICD-10-CM | POA: Diagnosis not present

## 2022-09-19 DIAGNOSIS — L97529 Non-pressure chronic ulcer of other part of left foot with unspecified severity: Secondary | ICD-10-CM

## 2022-09-19 DIAGNOSIS — L97424 Non-pressure chronic ulcer of left heel and midfoot with necrosis of bone: Secondary | ICD-10-CM

## 2022-09-19 DIAGNOSIS — Z87891 Personal history of nicotine dependence: Secondary | ICD-10-CM

## 2022-09-19 DIAGNOSIS — A480 Gas gangrene: Secondary | ICD-10-CM

## 2022-09-19 DIAGNOSIS — E785 Hyperlipidemia, unspecified: Secondary | ICD-10-CM

## 2022-09-19 DIAGNOSIS — E782 Mixed hyperlipidemia: Secondary | ICD-10-CM

## 2022-09-19 DIAGNOSIS — E11628 Type 2 diabetes mellitus with other skin complications: Secondary | ICD-10-CM

## 2022-09-19 HISTORY — PX: AMPUTATION: SHX166

## 2022-09-19 LAB — BLOOD CULTURE ID PANEL (REFLEXED) - BCID2

## 2022-09-19 LAB — IRON AND TIBC
Iron: 14 ug/dL — ABNORMAL LOW (ref 28–170)
Saturation Ratios: 7 % — ABNORMAL LOW (ref 10.4–31.8)
TIBC: 195 ug/dL — ABNORMAL LOW (ref 250–450)
UIBC: 181 ug/dL

## 2022-09-19 LAB — COMPREHENSIVE METABOLIC PANEL
ALT: 19 U/L (ref 0–44)
AST: 17 U/L (ref 15–41)
Albumin: 2.2 g/dL — ABNORMAL LOW (ref 3.5–5.0)
Alkaline Phosphatase: 87 U/L (ref 38–126)
Anion gap: 9 (ref 5–15)
BUN: 32 mg/dL — ABNORMAL HIGH (ref 6–20)
CO2: 24 mmol/L (ref 22–32)
Calcium: 8.4 mg/dL — ABNORMAL LOW (ref 8.9–10.3)
Chloride: 97 mmol/L — ABNORMAL LOW (ref 98–111)
Creatinine, Ser: 0.96 mg/dL (ref 0.44–1.00)
GFR, Estimated: >60 mL/min (ref >60)
Glucose, Bld: 303 mg/dL — ABNORMAL HIGH (ref 70–99)
Potassium: 4.7 mmol/L (ref 3.5–5.1)
Sodium: 130 mmol/L — ABNORMAL LOW (ref 135–145)
Total Bilirubin: 0.9 mg/dL (ref 0.3–1.2)
Total Protein: 6.6 g/dL (ref 6.5–8.1)

## 2022-09-19 LAB — GLUCOSE, CAPILLARY
Glucose-Capillary: 304 mg/dL — ABNORMAL HIGH (ref 70–99)
Glucose-Capillary: 234 mg/dL — ABNORMAL HIGH (ref 70–99)
Glucose-Capillary: 229 mg/dL — ABNORMAL HIGH (ref 70–99)
Glucose-Capillary: 325 mg/dL — ABNORMAL HIGH (ref 70–99)
Glucose-Capillary: 409 mg/dL — ABNORMAL HIGH (ref 70–99)

## 2022-09-19 LAB — CBC
HCT: 27.7 % — ABNORMAL LOW (ref 36.0–46.0)
Hemoglobin: 8.9 g/dL — ABNORMAL LOW (ref 12.0–15.0)
MCH: 28.8 pg (ref 26.0–34.0)
MCHC: 32.1 g/dL (ref 30.0–36.0)
MCV: 89.6 fL (ref 80.0–100.0)
Platelets: 404 10*3/uL — ABNORMAL HIGH (ref 150–400)
RBC: 3.09 MIL/uL — ABNORMAL LOW (ref 3.87–5.11)
RDW: 13.1 % (ref 11.5–15.5)
WBC: 17.6 10*3/uL — ABNORMAL HIGH (ref 4.0–10.5)
nRBC: 0 % (ref 0.0–0.2)

## 2022-09-19 LAB — TYPE AND SCREEN
ABO/RH(D): A POS
Antibody Screen: NEGATIVE

## 2022-09-19 LAB — FERRITIN: Ferritin: 314 ng/mL — ABNORMAL HIGH (ref 11–307)

## 2022-09-19 LAB — GLUCOSE, RANDOM: Glucose, Bld: 464 mg/dL — ABNORMAL HIGH (ref 70–99)

## 2022-09-19 LAB — FOLATE: Folate: 9.8 ng/mL (ref >5.9)

## 2022-09-19 SURGERY — AMPUTATION BELOW KNEE
Anesthesia: General | Site: Knee | Laterality: Left

## 2022-09-19 MED ORDER — CARVEDILOL 3.125 MG PO TABS
6.2500 mg | ORAL_TABLET | Freq: Two times a day (BID) | ORAL | Status: DC
  Administered 2022-09-19 – 2022-09-30 (×22): 6.25 mg via ORAL
  Filled 2022-09-19 (×22): qty 2

## 2022-09-19 MED ORDER — METHYLPREDNISOLONE SODIUM SUCC 125 MG IJ SOLR: 125.0000 mg | Freq: Once | INTRAMUSCULAR | Status: DC | PRN

## 2022-09-19 MED ORDER — ONDANSETRON HCL 4 MG/2ML IJ SOLN
4.0000 mg | Freq: Four times a day (QID) | INTRAMUSCULAR | Status: DC
  Administered 2022-09-19 – 2022-09-20 (×2): 4 mg via INTRAVENOUS
  Filled 2022-09-19 (×2): qty 2

## 2022-09-19 MED ORDER — PHENYLEPHRINE 80 MCG/ML (10ML) SYRINGE FOR IV PUSH (FOR BLOOD PRESSURE SUPPORT)
PREFILLED_SYRINGE | INTRAVENOUS | Status: DC | PRN
  Administered 2022-09-19: 80 ug via INTRAVENOUS

## 2022-09-19 MED ORDER — TRAMADOL HCL 50 MG PO TABS: 50.0000 mg | ORAL_TABLET | Freq: Four times a day (QID) | ORAL | Status: DC | PRN

## 2022-09-19 MED ORDER — CHLORHEXIDINE GLUCONATE 4 % EX SOLN: 60.0000 mL | Freq: Once | CUTANEOUS | Status: DC

## 2022-09-19 MED ORDER — ENSURE MAX PROTEIN PO LIQD
11.0000 [oz_av] | Freq: Two times a day (BID) | ORAL | Status: DC
  Administered 2022-09-20 – 2022-09-24 (×7): 11 [oz_av] via ORAL
  Filled 2022-09-19: qty 330

## 2022-09-19 MED ORDER — DEXAMETHASONE SODIUM PHOSPHATE 10 MG/ML IJ SOLN
INTRAMUSCULAR | Status: AC
  Filled 2022-09-19: qty 1

## 2022-09-19 MED ORDER — FENTANYL CITRATE PF 50 MCG/ML IJ SOSY: 12.5000 ug | PREFILLED_SYRINGE | Freq: Once | INTRAMUSCULAR | Status: DC | PRN

## 2022-09-19 MED ORDER — MORPHINE SULFATE (PF) 2 MG/ML IV SOLN
1.0000 mg | INTRAVENOUS | Status: DC | PRN
  Administered 2022-09-19: 2 mg via INTRAVENOUS
  Filled 2022-09-19: qty 1

## 2022-09-19 MED ORDER — ONDANSETRON HCL 4 MG/2ML IJ SOLN
INTRAMUSCULAR | Status: AC
  Filled 2022-09-19: qty 2

## 2022-09-19 MED ORDER — FAMOTIDINE 20 MG PO TABS: 40.0000 mg | ORAL_TABLET | Freq: Once | ORAL | Status: DC | PRN

## 2022-09-19 MED ORDER — VITAMIN C 500 MG PO TABS
500.0000 mg | ORAL_TABLET | Freq: Two times a day (BID) | ORAL | Status: DC
  Administered 2022-09-19 – 2022-09-30 (×21): 500 mg via ORAL
  Filled 2022-09-19 (×22): qty 1

## 2022-09-19 MED ORDER — CEFAZOLIN SODIUM-DEXTROSE 2-4 GM/100ML-% IV SOLN: 2.0000 g | INTRAVENOUS | Status: DC

## 2022-09-19 MED ORDER — ACETAMINOPHEN 10 MG/ML IV SOLN
INTRAVENOUS | Status: DC | PRN
  Administered 2022-09-19: 1000 mg via INTRAVENOUS

## 2022-09-19 MED ORDER — PROPOFOL 10 MG/ML IV BOLUS
INTRAVENOUS | Status: AC
  Filled 2022-09-19: qty 40

## 2022-09-19 MED ORDER — CHLORHEXIDINE GLUCONATE 4 % EX SOLN
60.0000 mL | Freq: Once | CUTANEOUS | Status: AC
  Administered 2022-09-19: 4 via TOPICAL

## 2022-09-19 MED ORDER — SUGAMMADEX SODIUM 500 MG/5ML IV SOLN
INTRAVENOUS | Status: DC | PRN
  Administered 2022-09-19: 300 mg via INTRAVENOUS

## 2022-09-19 MED ORDER — INSULIN GLARGINE-YFGN 100 UNIT/ML ~~LOC~~ SOLN
20.0000 [IU] | Freq: Every day | SUBCUTANEOUS | Status: DC
  Administered 2022-09-19: 20 [IU] via SUBCUTANEOUS
  Filled 2022-09-19 (×2): qty 0.2

## 2022-09-19 MED ORDER — INSULIN ASPART 100 UNIT/ML IJ SOLN
20.0000 [IU] | Freq: Once | INTRAMUSCULAR | Status: AC
  Administered 2022-09-19: 20 [IU] via SUBCUTANEOUS
  Filled 2022-09-19: qty 1

## 2022-09-19 MED ORDER — ONDANSETRON HCL 4 MG/2ML IJ SOLN
4.0000 mg | Freq: Four times a day (QID) | INTRAMUSCULAR | Status: DC
  Filled 2022-09-19: qty 2

## 2022-09-19 MED ORDER — CEFAZOLIN SODIUM-DEXTROSE 2-4 GM/100ML-% IV SOLN
2.0000 g | Freq: Three times a day (TID) | INTRAVENOUS | Status: DC
  Administered 2022-09-19 – 2022-09-30 (×33): 2 g via INTRAVENOUS
  Filled 2022-09-19 (×33): qty 100

## 2022-09-19 MED ORDER — FENTANYL CITRATE (PF) 100 MCG/2ML IJ SOLN
INTRAMUSCULAR | Status: AC
  Filled 2022-09-19: qty 2

## 2022-09-19 MED ORDER — LISINOPRIL 5 MG PO TABS
5.0000 mg | ORAL_TABLET | Freq: Every day | ORAL | Status: DC
  Filled 2022-09-19: qty 1

## 2022-09-19 MED ORDER — LIDOCAINE HCL (PF) 2 % IJ SOLN
INTRAMUSCULAR | Status: AC
  Filled 2022-09-19: qty 5

## 2022-09-19 MED ORDER — ONDANSETRON HCL 4 MG/2ML IJ SOLN
INTRAMUSCULAR | Status: DC | PRN
  Administered 2022-09-19: 4 mg via INTRAVENOUS

## 2022-09-19 MED ORDER — ACETAMINOPHEN 10 MG/ML IV SOLN
INTRAVENOUS | Status: AC
  Filled 2022-09-19: qty 100

## 2022-09-19 MED ORDER — BUPIVACAINE LIPOSOME 1.3 % IJ SUSP
INTRAMUSCULAR | Status: AC
  Filled 2022-09-19: qty 20

## 2022-09-19 MED ORDER — PROPOFOL 10 MG/ML IV BOLUS
INTRAVENOUS | Status: AC
  Filled 2022-09-19: qty 20

## 2022-09-19 MED ORDER — MIDAZOLAM HCL 2 MG/ML PO SYRP: 8.0000 mg | ORAL_SOLUTION | Freq: Once | ORAL | Status: DC | PRN

## 2022-09-19 MED ORDER — OXYCODONE-ACETAMINOPHEN 5-325 MG PO TABS
1.0000 | ORAL_TABLET | ORAL | Status: DC | PRN
  Administered 2022-09-19 – 2022-09-20 (×3): 2 via ORAL
  Filled 2022-09-19 (×3): qty 2

## 2022-09-19 MED ORDER — HYDROMORPHONE HCL 1 MG/ML IJ SOLN
1.0000 mg | Freq: Once | INTRAMUSCULAR | Status: AC | PRN
  Administered 2022-09-19: 1 mg via INTRAVENOUS

## 2022-09-19 MED ORDER — PANTOPRAZOLE SODIUM 40 MG PO TBEC
40.0000 mg | DELAYED_RELEASE_TABLET | Freq: Every day | ORAL | Status: DC
  Administered 2022-09-19 – 2022-09-30 (×12): 40 mg via ORAL
  Filled 2022-09-19 (×12): qty 1

## 2022-09-19 MED ORDER — SODIUM CHLORIDE 0.9 % IV SOLN: INTRAVENOUS | Status: DC

## 2022-09-19 MED ORDER — ONDANSETRON HCL 4 MG/2ML IJ SOLN
4.0000 mg | Freq: Four times a day (QID) | INTRAMUSCULAR | Status: DC | PRN
  Administered 2022-09-19 – 2022-09-23 (×3): 4 mg via INTRAVENOUS
  Filled 2022-09-19 (×3): qty 2

## 2022-09-19 MED ORDER — MIDAZOLAM HCL 2 MG/2ML IJ SOLN
INTRAMUSCULAR | Status: AC
  Filled 2022-09-19: qty 2

## 2022-09-19 MED ORDER — FENTANYL CITRATE (PF) 100 MCG/2ML IJ SOLN: 25.0000 ug | INTRAMUSCULAR | Status: DC | PRN

## 2022-09-19 MED ORDER — DIPHENHYDRAMINE HCL 50 MG/ML IJ SOLN: 50.0000 mg | Freq: Once | INTRAMUSCULAR | Status: DC | PRN

## 2022-09-19 MED ORDER — BUPIVACAINE HCL (PF) 0.5 % IJ SOLN
INTRAMUSCULAR | Status: AC
  Filled 2022-09-19: qty 30

## 2022-09-19 MED ORDER — PROPOFOL 10 MG/ML IV BOLUS
INTRAVENOUS | Status: DC | PRN
  Administered 2022-09-19: 150 mg via INTRAVENOUS
  Administered 2022-09-19: 100 ug/kg/min via INTRAVENOUS

## 2022-09-19 MED ORDER — MIDAZOLAM HCL 2 MG/2ML IJ SOLN
INTRAMUSCULAR | Status: DC | PRN
Start: 2022-09-19 — End: 2022-09-19
  Administered 2022-09-19: 2 mg via INTRAVENOUS

## 2022-09-19 MED ORDER — FENTANYL CITRATE (PF) 100 MCG/2ML IJ SOLN
INTRAMUSCULAR | Status: DC | PRN
Start: 2022-09-19 — End: 2022-09-19
  Administered 2022-09-19: 100 ug via INTRAVENOUS

## 2022-09-19 MED ORDER — DEXAMETHASONE SODIUM PHOSPHATE 10 MG/ML IJ SOLN
INTRAMUSCULAR | Status: DC | PRN
  Administered 2022-09-19: 10 mg via INTRAVENOUS

## 2022-09-19 MED ORDER — ROCURONIUM BROMIDE 100 MG/10ML IV SOLN
INTRAVENOUS | Status: DC | PRN
  Administered 2022-09-19: 5 mg via INTRAVENOUS
  Administered 2022-09-19: 60 mg via INTRAVENOUS

## 2022-09-19 MED ORDER — ADULT MULTIVITAMIN W/MINERALS CH
1.0000 | ORAL_TABLET | Freq: Every day | ORAL | Status: DC
  Administered 2022-09-20 – 2022-09-30 (×11): 1 via ORAL
  Filled 2022-09-19 (×11): qty 1

## 2022-09-19 MED ORDER — BUPIVACAINE LIPOSOME 1.3 % IJ SUSP
INTRAMUSCULAR | Status: DC | PRN
  Administered 2022-09-19: 40 mL

## 2022-09-19 SURGICAL SUPPLY — 38 items
BAG COUNTER SPONGE SURGICOUNT (BAG) ×2 IMPLANT
BAG SPNG CNTER NS LX DISP (BAG) ×1
BLADE SAGITTAL WIDE XTHICK NO (BLADE) ×2 IMPLANT
BLADE SURG SZ10 CARB STEEL (BLADE) ×2 IMPLANT
BNDG GAUZE DERMACEA FLUFF 4 (GAUZE/BANDAGES/DRESSINGS) ×2 IMPLANT
BNDG GZE DERMACEA 4 6PLY (GAUZE/BANDAGES/DRESSINGS) ×1
BRUSH SCRUB EZ 4% CHG (MISCELLANEOUS) ×2 IMPLANT
CANISTER WOUND CARE 500ML ATS (WOUND CARE) IMPLANT
DRAPE INCISE IOBAN 66X45 STRL (DRAPES) ×2 IMPLANT
DRSG VAC GRANUFOAM MED (GAUZE/BANDAGES/DRESSINGS) IMPLANT
ELECT CAUTERY BLADE 6.4 (BLADE) ×2 IMPLANT
ELECT REM PT RETURN 9FT ADLT (ELECTROSURGICAL) ×1
ELECTRODE REM PT RTRN 9FT ADLT (ELECTROSURGICAL) ×2 IMPLANT
GLOVE BIO SURGEON STRL SZ7 (GLOVE) ×4 IMPLANT
GLOVE INDICATOR 7.5 STRL GRN (GLOVE) ×2 IMPLANT
GLOVE SURG SYN 8.0 (GLOVE) ×1 IMPLANT
GLOVE SURG SYN 8.0 PF PI (GLOVE) ×2 IMPLANT
GOWN STRL REUS W/ TWL LRG LVL3 (GOWN DISPOSABLE) ×4 IMPLANT
GOWN STRL REUS W/ TWL XL LVL3 (GOWN DISPOSABLE) ×2 IMPLANT
GOWN STRL REUS W/TWL LRG LVL3 (GOWN DISPOSABLE) ×1
GOWN STRL REUS W/TWL XL LVL3 (GOWN DISPOSABLE) ×2
HANDLE YANKAUER SUCT BULB TIP (MISCELLANEOUS) ×2 IMPLANT
KIT TURNOVER KIT A (KITS) ×2 IMPLANT
LABEL OR SOLS (LABEL) ×2 IMPLANT
MANIFOLD NEPTUNE II (INSTRUMENTS) ×2 IMPLANT
NS IRRIG 500ML POUR BTL (IV SOLUTION) ×2 IMPLANT
PACK EXTREMITY ARMC (MISCELLANEOUS) ×2 IMPLANT
STOCKINETTE M/LG 89821 (MISCELLANEOUS) ×2 IMPLANT
SUT SILK 2 0 (SUTURE) ×1
SUT SILK 2-0 18XBRD TIE 12 (SUTURE) ×2 IMPLANT
SUT SILK 3 0 (SUTURE) ×1
SUT SILK 3-0 18XBRD TIE 12 (SUTURE) ×2 IMPLANT
SUT VIC AB 3-0 SH 27 (SUTURE) ×2
SUT VIC AB 3-0 SH 27X BRD (SUTURE) ×4 IMPLANT
SYR 20ML LL LF (SYRINGE) ×4 IMPLANT
TAPE UMBILICAL 1/8 X36 TWILL (MISCELLANEOUS) ×2 IMPLANT
TOWEL OR 17X26 4PK STRL BLUE (TOWEL DISPOSABLE) ×4 IMPLANT
TRAP FLUID SMOKE EVACUATOR (MISCELLANEOUS) ×2 IMPLANT

## 2022-09-19 NOTE — Progress Notes (Signed)
/  PHARMACY - PHYSICIAN COMMUNICATION  CRITICAL VALUE ALERT - BLOOD CULTURE IDENTIFICATION (BCID)  Nancy [name] is an 57 y.o. female who presented to [facility] on 09/18/2022 with a chief complaint of L leg swelling with history of chronic L heel ulcer  Assessment:  blood cultures from 9/11 with GPC currently in 1 of 4 bottles (interestingly lab tech stated that a bottle in the first set alarmed but unable to see anything on gram stain).  Source likely heel ulcer  Name of physician (or Provider) Contacted: Drs Clide Dales and Drue Second  Current antibiotics: Vancomycin, Cefepime, metronidazole  Changes to prescribed antibiotics recommended:  Auto ID consult - will evaluate tomorrow after surgery  Results for orders placed or performed during the hospital encounter of 09/18/22  Blood Culture ID Panel (Reflexed) (Collected: 09/18/2022  3:30 PM)  Result Value Ref Range   Enterococcus faecalis NOT DETECTED NOT DETECTED   Enterococcus Faecium NOT DETECTED NOT DETECTED   Listeria monocytogenes NOT DETECTED NOT DETECTED   Staphylococcus species DETECTED (A) NOT DETECTED   Staphylococcus aureus (BCID) DETECTED (A) NOT DETECTED   Staphylococcus epidermidis NOT DETECTED NOT DETECTED   Staphylococcus lugdunensis NOT DETECTED NOT DETECTED   Streptococcus species NOT DETECTED NOT DETECTED   Streptococcus agalactiae NOT DETECTED NOT DETECTED   Streptococcus pneumoniae NOT DETECTED NOT DETECTED   Streptococcus pyogenes NOT DETECTED NOT DETECTED   A.calcoaceticus-baumannii NOT DETECTED NOT DETECTED   Bacteroides fragilis NOT DETECTED NOT DETECTED   Enterobacterales NOT DETECTED NOT DETECTED   Enterobacter cloacae complex NOT DETECTED NOT DETECTED   Escherichia coli NOT DETECTED NOT DETECTED   Klebsiella aerogenes NOT DETECTED NOT DETECTED   Klebsiella oxytoca NOT DETECTED NOT DETECTED   Klebsiella pneumoniae NOT DETECTED NOT DETECTED   Proteus species NOT DETECTED NOT DETECTED   Salmonella species  NOT DETECTED NOT DETECTED   Serratia marcescens NOT DETECTED NOT DETECTED   Haemophilus influenzae NOT DETECTED NOT DETECTED   Neisseria meningitidis NOT DETECTED NOT DETECTED   Pseudomonas aeruginosa NOT DETECTED NOT DETECTED   Stenotrophomonas maltophilia NOT DETECTED NOT DETECTED   Candida albicans NOT DETECTED NOT DETECTED   Candida auris NOT DETECTED NOT DETECTED   Candida glabrata NOT DETECTED NOT DETECTED   Candida krusei NOT DETECTED NOT DETECTED   Candida parapsilosis NOT DETECTED NOT DETECTED   Candida tropicalis NOT DETECTED NOT DETECTED   Cryptococcus neoformans/gattii NOT DETECTED NOT DETECTED   Meth resistant mecA/C and MREJ NOT DETECTED NOT DETECTED    Juliette Alcide, PharmD, BCPS, BCIDP Work Cell: [phone] 09/19/2022 8:58 AM

## 2022-09-19 NOTE — Anesthesia Procedure Notes (Signed)
Procedure Name: Intubation Date/Time: 09/19/2022 12:30 PM  Performed by: Elisabeth Pigeon, CRNAPre-anesthesia Checklist: Patient identified, Patient being monitored, Timeout performed, Emergency Drugs available and Suction available Patient Re-evaluated:Patient Re-evaluated prior to induction Oxygen Delivery Method: Circle system utilized Preoxygenation: Pre-oxygenation with 100% oxygen Induction Type: IV induction Ventilation: Mask ventilation without difficulty Laryngoscope Size: Mac, 3 and McGraph Grade View: Grade I Tube type: Oral Tube size: 6.5 mm Number of attempts: 1 Airway Equipment and Method: Stylet Placement Confirmation: ETT inserted through vocal cords under direct vision, positive ETCO2 and breath sounds checked- equal and bilateral Secured at: 21 cm Tube secured with: Tape Dental Injury: Teeth and Oropharynx as per pre-operative assessment

## 2022-09-19 NOTE — Progress Notes (Signed)
Initial Nutrition Assessment  DOCUMENTATION CODES:   Morbid obesity  INTERVENTION:   Ensure Max protein supplement po BID, each supplement provides 150kcal and 30g of protein.  MVI po daily   Vitamin C 500mg  po BID  NUTRITION DIAGNOSIS:   Increased nutrient needs related to wound healing as evidenced by estimated needs.  GOAL:   Patient will meet greater than or equal to 90% of their needs  MONITOR:   PO intake, Supplement acceptance, Labs, Weight trends, I & O's, Skin  REASON FOR ASSESSMENT:   Consult Wound healing  ASSESSMENT:   57 y/o female with h/o HTN, asthma, HLD, cardiomyopathy, DM, CHF, depression, CAD and GERD who is admitted with diabetic foot wound, sepsis and osteomyelitis.  RD unable to see patient today as pt in the OR at time of RD visit. Pt with increased estimated needs r/t wound healing. RD will add supplements and vitamins to help pt meet her estimated needs and to support wound healing. Plan is for guillotine amputation today and then formal below-knee amputation likely this coming Tuesday. RD will follow up to obtain nutrition related history and exam. Per chart, pt appears weight stable at baseline.   Medications reviewed and include: lovenox, insulin, NaCl @75ml /hr, cefepime, metronidazole, vancomycin   Labs reviewed: Na 130(L), K 4.7 wnl, BUN 32(H) Wbc- 17.6(H), Hgb 8.9(L), Hct 27.7(L) Cbgs- 234, 304, 340 x 24 hrs  AIC 10.6(H)- 7/31  NUTRITION - FOCUSED PHYSICAL EXAM: Unable to perform at this time   Diet Order:   Diet Order             Diet NPO time specified Except for: Sips with Meds  Diet effective midnight                  EDUCATION NEEDS:   Not appropriate for education at this time  Skin:  Skin Assessment: Reviewed RN Assessment (diabetic foot wound)  Last BM:  9/11  Height:   Ht Readings from Last 1 Encounters:  09/18/22 5\' 9"  (1.753 m)    Weight:   Wt Readings from Last 1 Encounters:  09/18/22 124.3 kg     Ideal Body Weight:  65.9 kg  BMI:  Body mass index is 40.46 kg/m.  Estimated Nutritional Needs:   Kcal:  2400-2700kcal/day  Protein:  >120g/day  Fluid:  1.7-2.0L/day  Betsey Holiday MS, RD, LDN Please refer to [facility] for RD and/or RD on-call/weekend/after hours pager

## 2022-09-19 NOTE — Anesthesia Postprocedure Evaluation (Signed)
Anesthesia Post Note  Patient: Nancy [name]  Procedure(s) Performed: AMPUTATION BELOW KNEE (Left: Knee)  Patient location during evaluation: PACU Anesthesia Type: General Level of consciousness: awake and alert Pain management: pain level controlled Vital Signs Assessment: post-procedure vital signs reviewed and stable Respiratory status: spontaneous breathing, nonlabored ventilation, respiratory function stable and patient connected to nasal cannula oxygen Cardiovascular status: blood pressure returned to baseline and stable Postop Assessment: no apparent nausea or vomiting Anesthetic complications: no   No notable events documented.   Last Vitals:  Vitals:   09/19/22 1320 09/19/22 1330  BP:  118/72  Pulse: 84 87  Resp: (!) 23 18  Temp:    SpO2: 100% 100%    Last Pain:  Vitals:   09/19/22 1317  TempSrc:   PainSc: Asleep                 Louie Boston

## 2022-09-19 NOTE — Consult Note (Signed)
Pharmacy Antibiotic Note  Nancy [name] is a 57 y.o. female admitted on 09/18/2022 with  lower extremity wound infection, bacteremia .  Pharmacy has been consulted for Cefazolin dosing.  Plan: Cefazolin 2 g IV every 8 hours Flagyl 500 mg IV every 12 hours per provider Follow renal function for adjustments  Height: 5\' 9"  (175.3 cm) Weight: 124.3 kg (274 lb) IBW/kg (Calculated) : 66.2  Temp (24hrs), Avg:98.6 F (37 C), Min:97.5 F (36.4 C), Max:100.5 F (38.1 C)  Recent Labs  Lab 09/18/22 1348 09/18/22 1845 09/18/22 2040 09/19/22 0417  WBC 19.9*  --   --  17.6*  CREATININE 0.97  --   --  0.96  LATICACIDVEN  --  2.1* 1.2  --     Estimated Creatinine Clearance: 91.2 mL/min (by C-G formula based on SCr of 0.96 mg/dL).    Allergies  Allergen Reactions   Shellfish Allergy Anaphylaxis   Codeine Other (See Comments)    Migraine  Other Reaction(s): Other (See Comments)  Reaction:  Severe migraines   Morphine And Codeine Other (See Comments)    Reaction:  Severe migraines    Percocet [Oxycodone-Acetaminophen] Nausea And Vomiting   Rosuvastatin     Other reaction(s): Muscle Pain   Sulfa Antibiotics Rash   Theophyllines Itching and Rash    Antimicrobials this admission: Vancomycin 9/11 >> 9/12 Cefepime 9/11 >> 9/12 Flagyl 9/11 >> Cefazolin 9/12 >>   Dose adjustments this admission: N/A  Microbiology results: 9/11 BCx: pending (BCID with GPC in 1 of 4 bottles, source likely heel ulcer) 9/11 Wound Cx: pending  Thank you for allowing pharmacy to be a part of this patient's care.  Jerrilyn Cairo, PharmD 09/19/2022 8:57 PM

## 2022-09-19 NOTE — Anesthesia Preprocedure Evaluation (Addendum)
Anesthesia Evaluation  Patient identified by MRN, date of birth, ID band Patient awake    Reviewed: Allergy & Precautions, NPO status , Patient's Chart, lab work & pertinent test results  History of Anesthesia Complications (+) PONV and history of anesthetic complications  Airway Mallampati: III  TM Distance: >3 FB Neck ROM: full    Dental no notable dental hx.    Pulmonary asthma , former smoker   Pulmonary exam normal        Cardiovascular hypertension, On Medications and On Home Beta Blockers + CAD, + Peripheral Vascular Disease and +CHF  Normal cardiovascular exam  Takotsubo Cardiomyopathy   Neuro/Psych  Headaches PSYCHIATRIC DISORDERS Anxiety Depression       GI/Hepatic Neg liver ROS,GERD  Medicated,,  Endo/Other  diabetes, Poorly Controlled, Type 2, Insulin Dependent    Renal/GU      Musculoskeletal   Abdominal   Peds  Hematology negative hematology ROS (+)   Anesthesia Other Findings Past Medical History: 09/21/2018: Abnormal cardiovascular stress test     Comment:  Formatting of this note might be different from the               original. Lexiscan Myoview 09/16/2018 revealed mild               anterior ischemia 05/10/2014: Adverse effect of motion No date: Anxiety No date: Arthritis     Comment:  r knee No date: Asthma 05/10/2014: Breast cyst No date: CAD (coronary artery disease)     Comment:  a.) LHC 06/04/2009 at [facility]; non-obstructive CAD. b.) CTA               with FFR 10/08/2018: extensive mixed plaque proximal to               mid LAD (51-69%); Coronary Ca score 217; FFR 0.71 dPDA,               0.86 mLAD, 0.87 dLCx. 06/06/2008: CCF (congestive cardiac failure) (HCC)     Comment:  a.) 30% EF. b.) TTE 06/03/2011: EF >55%; triv MR. c.)               TTE 04/27/2018: EF 55%; mild LVH; triv PR, mild MR/TR;               G1DD. 07/02/2016: Chest pain with high risk for cardiac etiology 02/13/2022:  Chronic use of opiate drug for therapeutic purpose     Comment:  Formatting of this note might be different from the               original. [facility] Contract signed on 04/18/16 & updated               03/04/17; UDS done on 04/18/16. No date: Complication of anesthesia 03/13/2015: Diabetic foot infection (HCC) 02/08/2021: Diabetic foot ulcer (HCC) No date: Eczema No date: Family history of adverse reaction to anesthesia     Comment:  a.) PONV in mother and grandmother 05/10/2014: GERD (gastroesophageal reflux disease) No date: History of kidney stones No date: HLD (hyperlipidemia) 03/13/2015: Hyponatremia No date: Migraines No date: Motion sickness 02/13/2022: Other acute osteomyelitis, left ankle and foot (HCC) No date: Panic attacks 04/27/2018: Peripheral edema No date: Pneumonia No date: PONV (postoperative nausea and vomiting) 03/13/2015: Sepsis secondary to diabetic foot infection No date: T2DM (type 2 diabetes mellitus) (HCC) 05/10/2014: Takotsubo cardiomyopathy / transient apical balooning  syndrome / stress-induced cardiomyopathy No date: Unspecified essential hypertension  Past Surgical History: 03/16/2015: AMPUTATION  TOE; Left     Comment:  Procedure: left fifth toe amputation with incision and               drainage;  Surgeon: Gwyneth Revels, DPM;  Location: [facility]               ORS;  Service: Podiatry;  Laterality: Left; 02/14/2021: APPLICATION OF WOUND VAC; Left     Comment:  Procedure: APPLICATION OF WOUND VAC;  Surgeon: Gwyneth Revels, DPM;  Location: [facility] ORS;  Service: Podiatry;                Laterality: Left; 04/16/2021: APPLICATION OF WOUND VAC; Left     Comment:  Procedure: APPLICATION OF WOUND VAC;  Surgeon:               Peggye Form, DO;  Location: [facility];  Service: Plastics;  Laterality: Left; 06/04/2009: CARDIAC CATHETERIZATION; Left     Comment:  Procedure: CARDIAC CATHETERIZATION; Location: [facility] No date: CHOLECYSTECTOMY 04/16/2021: DEBRIDEMENT AND CLOSURE WOUND; Left     Comment:  Procedure: DEBRIDEMENT AND CLOSURE WOUND;  Surgeon:               Peggye Form, DO;  Location: [facility];  Service: Plastics;  Laterality: Left;  1 hour 02/08/2021: IRRIGATION AND DEBRIDEMENT FOOT; Left     Comment:  Procedure: IRRIGATION AND DEBRIDEMENT FOOT - LFT HEEL               ULCER;  Surgeon: Gwyneth Revels, DPM;  Location: [facility]               ORS;  Service: Podiatry;  Laterality: Left; 02/14/2021: IRRIGATION AND DEBRIDEMENT FOOT; Left     Comment:  Procedure: IRRIGATION AND DEBRIDEMENT LEFT HEEL;                Surgeon: Gwyneth Revels, DPM;  Location: [facility] ORS;                Service: Podiatry;  Laterality: Left; No date: kidney stone removal No date: KNEE ARTHROSCOPY W/ MENISCAL REPAIR 01/07/1989: NOSE SURGERY     Comment:  due to fracture No date: TOE AMPUTATION     Comment:  second toe No date: TONSILLECTOMY 01/11/2011: TOTAL KNEE ARTHROPLASTY     Comment:  Procedure: TOTAL KNEE ARTHROPLASTY;  Surgeon: Harvie Junior;  [facility]: [facility];  Service: Orthopedics;                Laterality: Right;  COMPUTER ASSISTED TOTAL KNEE               REPLACEMENT  BMI    Body Mass Index: 40.46 kg/m      Reproductive/Obstetrics negative OB ROS                             Anesthesia Physical Anesthesia Plan  ASA: 3  Anesthesia Plan: General ETT   Post-op Pain Management: Toradol IV (intra-op)* and Ofirmev IV (intra-op)*   Induction: Intravenous  PONV Risk Score and Plan: 4 or greater and Ondansetron, Dexamethasone, Treatment may vary due to age or medical condition, Midazolam, Propofol infusion and TIVA  Airway Management Planned: Oral ETT  Additional Equipment:   Intra-op Plan:   Post-operative Plan: Extubation in OR  Informed Consent: I have reviewed the patients History and Physical,  chart, labs and discussed the procedure including the risks, benefits and alternatives for the proposed anesthesia with the patient or authorized representative who has indicated his/her understanding and acceptance.     Dental Advisory Given  Plan Discussed with: Anesthesiologist, CRNA and Surgeon  Anesthesia Plan Comments: (Patient consented for risks of anesthesia including but not limited to:  - adverse reactions to medications - damage to eyes, teeth, lips or other oral mucosa - nerve damage due to positioning  - sore throat or hoarseness - Damage to heart, brain, nerves, lungs, other parts of body or loss of life  Patient voiced understanding.)       Anesthesia Quick Evaluation

## 2022-09-19 NOTE — Interval H&P Note (Signed)
History and Physical Interval Note:  09/19/2022 11:55 AM  Nancy [name]  has presented today for surgery, with the diagnosis of Osteomylitis.  The various methods of treatment have been discussed with the patient and family. After consideration of risks, benefits and other options for treatment, the patient has consented to  Procedure(s): AMPUTATION BELOW KNEE (Left) as a surgical intervention.  The patient's history has been reviewed, patient examined, no change in status, stable for surgery.  I have reviewed the patient's chart and labs.  Questions were answered to the patient's satisfaction.     [name]

## 2022-09-19 NOTE — TOC Transition Note (Signed)
Transition of Care [facility]) - CM/SW Discharge Note   Patient Details  Name: Nancy [name] MRN: [id number] Date of Birth: [birth year]  [facility] CM/SW Contact:  Marlowe Sax, RN Phone Number: 09/19/2022, 9:42 AM   Clinical Narrative:    The patient is from home, plan guillotine amputation today and then formal below-knee amputation likely this coming Tuesday  TOC to continue to follow and assist with DC planning   Final next level of care:  (TBD) Barriers to Discharge: Continued Medical Work up   Patient Goals and CMS Choice      Discharge Placement                         Discharge Plan and Services Additional resources added to the After Visit Summary for     Discharge Planning Services: CM Consult            DME Arranged: N/A DME Agency: NA                  Social Determinants of Health (SDOH) Interventions SDOH Screenings   Depression (PHQ2-9): Low Risk  (05/31/2021)  Tobacco Use: Medium Risk (09/18/2022)     Readmission Risk Interventions     No data to display

## 2022-09-19 NOTE — Progress Notes (Signed)
MRN : [id number]  Nancy [name] is a 57 y.o. (11-Sep-1965) female who presents with chief complaint of check circulation.  History of Present Illness:   Patient admitted to [facility] yesterday with signs and symptoms of sepsis secondary to a left ankle/heel wound.  Workup has demonstrated significant bony involvement as well as gas within the soft tissues.  Podiatry has evaluated and does not feel that the situation is salvageable.  Since admission the patient states she is feeling slightly better although her primary complaint remains the severe pain in her left ankle.  She denies shaking chills or spiking fevers.  Nausea is improved she is not exhibiting any systemic symptoms.  She was seen in the office approximately 1 year ago September 28, 2021.  ABIs at that time showed she was noncompressible due to medial calcification but the waveforms are triphasic on the right and biphasic on the left with digit pressures of 147 on the right and 119 on the left.   Current Meds  Medication Sig   acidophilus (RISAQUAD) CAPS capsule Take 1 capsule by mouth daily.   albuterol (PROVENTIL HFA;VENTOLIN HFA) 108 (90 Base) MCG/ACT inhaler Inhale 2 puffs into the lungs every 4 (four) hours as needed for wheezing or shortness of breath. Reported on 03/29/2015   albuterol (PROVENTIL) (2.5 MG/3ML) 0.083% nebulizer solution Take 2.5 mg by nebulization as needed.   ALPRAZolam (XANAX) 0.5 MG tablet TAKE 1/2 TO 1 TABLET BY MOUTH AS NEEDED FOR PANIC ATTACKS   ascorbic acid (VITAMIN C) 500 MG tablet Take 1 tablet (500 mg total) by mouth 2 (two) times daily.   aspirin EC 81 MG tablet Take 81 mg by mouth daily.   aspirin-acetaminophen-caffeine (EXCEDRIN MIGRAINE) 250-250-65 MG tablet Take 2 tablets by mouth every 6 (six) hours as needed for migraine.   carvedilol (COREG) 6.25 MG tablet TAKE 1 TABLET BY MOUTH TWICE A DAY    desvenlafaxine (PRISTIQ) 100 MG 24 hr tablet TAKE 1 TABLET BY MOUTH EVERY DAY   fenofibrate 54 MG tablet TAKE 1 TABLET BY MOUTH EVERY DAY   ibuprofen (ADVIL,MOTRIN) 800 MG tablet Take 800 mg by mouth every 8 (eight) hours as needed (for migraines).    insulin NPH-regular Human (70-30) 100 UNIT/ML injection Inject 50 Units into the skin 2 (two) times daily with a meal.   lisinopril (ZESTRIL) 5 MG tablet TAKE 1 TABLET BY MOUTH EVERY DAY (Patient taking differently: Take 5 mg by mouth 2 (two) times daily.)   metFORMIN (GLUCOPHAGE) 500 MG tablet TAKE TWO TABLETS EACH MORNING. AND 3 TABLETS IN THE EVENING AS DIRECTED   montelukast (SINGULAIR) 10 MG tablet TAKE 1 TABLET BY MOUTH EVERYDAY AT BEDTIME   nystatin (MYCOSTATIN/NYSTOP) powder APPLY TOPICALLY TWICE DAILY AS NEEDED   ondansetron (ZOFRAN-ODT) 4 MG disintegrating tablet Take 4 mg by mouth every 8 (eight) hours as needed.   pantoprazole (PROTONIX) 40 MG tablet TAKE 1 TABLET BY MOUTH EVERY DAY   pravastatin (PRAVACHOL) 40 MG tablet TAKE 1 TABLET BY MOUTH EVERY DAY   traMADol (ULTRAM) 50 MG tablet Take 50 mg by mouth  every 6 (six) hours as needed (for migraines).   zinc sulfate 220 (50 Zn) MG capsule Take 1 capsule (220 mg total) by mouth daily.    Past Medical History:  Diagnosis Date   Abnormal cardiovascular stress test 09/21/2018   Formatting of this note might be different from the original. Lexiscan Myoview 09/16/2018 revealed mild anterior ischemia   Adverse effect of motion 05/10/2014   Anxiety    Arthritis    r knee   Asthma    Breast cyst 05/10/2014   CAD (coronary artery disease)    a.) LHC 06/04/2009 at [facility]; non-obstructive CAD. b.) CTA with FFR 10/08/2018: extensive mixed plaque proximal to mid LAD (51-69%); Coronary Ca score 217; FFR 0.71 dPDA, 0.86 mLAD, 0.87 dLCx.   CCF (congestive cardiac failure) (HCC) 06/06/2008   a.) 30% EF. b.) TTE 06/03/2011: EF >55%; triv MR. c.) TTE 04/27/2018: EF 55%; mild LVH; triv PR, mild MR/TR;  G1DD.   Chest pain with high risk for cardiac etiology 07/02/2016   Chronic use of opiate drug for therapeutic purpose 02/13/2022   Formatting of this note might be different from the original. [facility] Pain Contract signed on 04/18/16 & updated 03/04/17; UDS done on 04/18/16.   Complication of anesthesia    Diabetic foot infection (HCC) 03/13/2015   Diabetic foot ulcer (HCC) 02/08/2021   Eczema    Family history of adverse reaction to anesthesia    a.) PONV in mother and grandmother   GERD (gastroesophageal reflux disease) 05/10/2014   History of kidney stones    HLD (hyperlipidemia)    Hyponatremia 03/13/2015   Migraines    Motion sickness    Other acute osteomyelitis, left ankle and foot (HCC) 02/13/2022   Panic attacks    Peripheral edema 04/27/2018   Pneumonia    PONV (postoperative nausea and vomiting)    Sepsis secondary to diabetic foot infection 03/13/2015   T2DM (type 2 diabetes mellitus) (HCC)    Takotsubo cardiomyopathy / transient apical balooning syndrome / stress-induced cardiomyopathy 05/10/2014   Unspecified essential hypertension     Past Surgical History:  Procedure Laterality Date   AMPUTATION TOE Left 03/16/2015   Procedure: left fifth toe amputation with incision and drainage;  Surgeon: Gwyneth Revels, DPM;  Location: [facility] ORS;  Service: Podiatry;  Laterality: Left;   APPLICATION OF WOUND VAC Left 02/14/2021   Procedure: APPLICATION OF WOUND VAC;  Surgeon: Gwyneth Revels, DPM;  Location: [facility] ORS;  Service: Podiatry;  Laterality: Left;   APPLICATION OF WOUND VAC Left 04/16/2021   Procedure: APPLICATION OF WOUND VAC;  Surgeon: Peggye Form, DO;  Location: [facility];  Service: Plastics;  Laterality: Left;   CARDIAC CATHETERIZATION Left 06/04/2009   Procedure: CARDIAC CATHETERIZATION; Location: [facility]   CHOLECYSTECTOMY     DEBRIDEMENT AND CLOSURE WOUND Left 04/16/2021   Procedure: DEBRIDEMENT AND CLOSURE WOUND;  Surgeon:  Peggye Form, DO;  Location:  SURGERY CENTER;  Service: Plastics;  Laterality: Left;  1 hour   IRRIGATION AND DEBRIDEMENT FOOT Left 02/08/2021   Procedure: IRRIGATION AND DEBRIDEMENT FOOT - LFT HEEL ULCER;  Surgeon: Gwyneth Revels, DPM;  Location: [facility] ORS;  Service: Podiatry;  Laterality: Left;   IRRIGATION AND DEBRIDEMENT FOOT Left 02/14/2021   Procedure: IRRIGATION AND DEBRIDEMENT LEFT HEEL;  Surgeon: Gwyneth Revels, DPM;  Location: [facility] ORS;  Service: Podiatry;  Laterality: Left;   kidney stone removal     KNEE ARTHROSCOPY W/ MENISCAL REPAIR     NOSE SURGERY  01/07/1989   due to fracture   TOE AMPUTATION     second toe   TONSILLECTOMY     TOTAL KNEE ARTHROPLASTY  01/11/2011   Procedure: TOTAL KNEE ARTHROPLASTY;  Surgeon: Harvie Junior;  Location: [facility];  Service: Orthopedics;  Laterality: Right;  COMPUTER ASSISTED TOTAL KNEE REPLACEMENT    Social History Social History   Tobacco Use   Smoking status: Former    Current packs/day: 0.00    Types: Cigarettes    Quit date: 03/12/1985    Years since quitting: 37.5   Smokeless tobacco: Never  Vaping Use   Vaping status: Never Used  Substance Use Topics   Alcohol use: No   Drug use: No    Family History Family History  Problem Relation Age of Onset   Transient ischemic attack Mother    Asthma Mother    Hypertension Mother    Lung cancer Father    Hypertension Father    Asthma Sister    Anxiety disorder Sister    Stomach cancer Maternal Grandmother    Colon cancer Maternal Grandmother    Leukemia Maternal Grandmother    Melanoma Maternal Grandfather    Asthma Sister    Hypertension Brother    Cancer Other    Crohn's disease Other    GER disease Other    Breast cancer Neg Hx     Allergies  Allergen Reactions   Shellfish Allergy Anaphylaxis   Codeine Other (See Comments)    Migraine  Other Reaction(s): Other (See Comments)  Reaction:  Severe migraines   Morphine And Codeine Other (See Comments)     Reaction:  Severe migraines    Percocet [Oxycodone-Acetaminophen] Nausea And Vomiting   Rosuvastatin     Other reaction(s): Muscle Pain   Sulfa Antibiotics Rash   Theophyllines Itching and Rash     REVIEW OF SYSTEMS (Negative unless checked)  Constitutional: [] Weight loss  [] Fever  [] Chills Cardiac: [] Chest pain   [] Chest pressure   [] Palpitations   [] Shortness of breath when laying flat   [] Shortness of breath with exertion. Vascular:  [x] Pain in legs with walking   [] Pain in legs at rest  [] History of DVT   [] Phlebitis   [] Swelling in legs   [] Varicose veins   [] Non-healing ulcers Pulmonary:   [] Uses home oxygen   [] Productive cough   [] Hemoptysis   [] Wheeze  [] COPD   [] Asthma Neurologic:  [] Dizziness   [] Seizures   [] History of stroke   [] History of TIA  [] Aphasia   [] Vissual changes   [] Weakness or numbness in arm   [] Weakness or numbness in leg Musculoskeletal:   [] Joint swelling   [] Joint pain   [] Low back pain Hematologic:  [] Easy bruising  [] Easy bleeding   [] Hypercoagulable state   [] Anemic Gastrointestinal:  [] Diarrhea   [] Vomiting  [] Gastroesophageal reflux/heartburn   [] Difficulty swallowing. Genitourinary:  [] Chronic kidney disease   [] Difficult urination  [] Frequent urination   [] Blood in urine Skin:  [] Rashes   [] Ulcers  Psychological:  [] History of anxiety   []  History of major depression.  Physical Examination  Vitals:   09/18/22 1836 09/18/22 2015 09/18/22 2358 09/19/22 0801  BP: (!) 180/72 (!) 125/53 (!) 139/56 136/60  Pulse: 94 90 87 93  Resp: 18 18 18 14   Temp: (!) 101.1 F (38.4 C) (!) 100.8 F (38.2 C) (!) 100.5 F (38.1 C) 99 F (37.2 C)  TempSrc: Oral     SpO2: 96% 97% 94% 96%  Weight:  Height:       Body mass index is 40.46 kg/m. Gen: WD/WN, NAD Head: [place]/AT, No temporalis wasting.  Ear/Nose/Throat: Hearing grossly intact, nares w/o erythema or drainage Eyes: PER, EOMI, sclera nonicteric.  Neck: Supple, no masses.  No bruit or JVD.   Pulmonary:  Good air movement, no audible wheezing, no use of accessory muscles.  Cardiac: RRR, normal S1, S2, no Murmurs. Vascular: Left ankle is wrapped in an Ace however there is 3+ edema of the ankle and distal calf area.  There is erythema of the skin which is extending up to the level of the gastroc muscles. Vessel Right Left  Radial Palpable Palpable  Gastrointestinal: soft, non-distended. No guarding/no peritoneal signs.  Musculoskeletal: M/S 5/5 throughout.  No visible deformity.  Neurologic: CN 2-12 intact. Pain and light touch intact in extremities.  Symmetrical.  Speech is fluent. Motor exam as listed above. Psychiatric: Judgment intact, Mood & affect appropriate for pt's clinical situation. Dermatologic: No rashes.  + changes consistent with cellulitis.   CBC Lab Results  Component Value Date   WBC 17.6 (H) 09/19/2022   HGB 8.9 (L) 09/19/2022   HCT 27.7 (L) 09/19/2022   MCV 89.6 09/19/2022   PLT 404 (H) 09/19/2022    BMET    Component Value Date/Time   NA 130 (L) 09/19/2022 0417   NA 132 (L) 08/07/2022 1210   NA 133 (L) 08/21/2012 1523   K 4.7 09/19/2022 0417   K 4.1 08/21/2012 1523   CL 97 (L) 09/19/2022 0417   CL 102 08/21/2012 1523   CO2 24 09/19/2022 0417   CO2 26 08/21/2012 1523   GLUCOSE 303 (H) 09/19/2022 0417   GLUCOSE 306 (H) 08/21/2012 1523   BUN 32 (H) 09/19/2022 0417   BUN 23 08/07/2022 1210   BUN 17 08/21/2012 1523   CREATININE 0.96 09/19/2022 0417   CREATININE 0.69 08/21/2012 1523   CALCIUM 8.4 (L) 09/19/2022 0417   CALCIUM 8.8 08/21/2012 1523   GFRNONAA >60 09/19/2022 0417   GFRNONAA >60 08/21/2012 1523   GFRAA 117 11/04/2016 1109   GFRAA >60 08/21/2012 1523   Estimated Creatinine Clearance: 91.2 mL/min (by C-G formula based on SCr of 0.96 mg/dL).  COAG Lab Results  Component Value Date   INR 1.5 (H) 09/18/2022   INR 1.2 02/07/2021   INR 1.21 03/13/2015    Radiology DG Foot Complete Left  Result Date: 09/18/2022 CLINICAL DATA:   Heel ulcer EXAM: LEFT FOOT - COMPLETE 3+ VIEW COMPARISON:  02/07/2021 radiographs of the ankle FINDINGS: Extensive vascular calcifications. Posterior heel ulcer with large amount of gas within the heel pad and posterior heel. Possible cortical bone resorption posterior calcaneal margin on lateral view. Patient is status post partial amputation of the second digit at the level of head of proximal phalanx and transmetatarsal amputation of fifth digit. Degenerative changes at the first MTP joint. IMPRESSION: Posterior heel ulcer with large amount of gas within the heel pad and posterior heel consistent with heel ulcer and necrotic infection. Possible cortical resorption/subtle osteomyelitis changes posterior calcaneus deep to the heel ulcer Electronically Signed   By: Jasmine Pang M.D.   On: 09/18/2022 17:17     Assessment/Plan 1.  Diabetic foot ulcer left lower extremity with cellulitis and sepsis: The patient has evidence of gas in the soft tissues as well as severe bony destruction consistent with osteomyelitis of the calcaneus.  Podiatry has evaluated her and there is really nothing further that can be offered for limb salvage.  Considering all the patient has been through both she and the podiatry service have done an incredible job of preserving her foot this long.  Given the markedly elevated blood sugars in association with the cellulitis extending into the potential operative field I have discussed with the patient 2 options.  1.  Proceed with guillotine amputation today and then allowing for further antibiotics and eradication of the infection as well as better control of her blood sugars.  Then moving forward in 4 to 5 days with a formal below-knee amputation.  Although this is a more conservative option requiring 2 surgeries clearly offers the best chance of healing and overall a more rapid recovery with a successful below-knee amputation.  2.  Wait for another 24 hours and then move forward with a  below-knee amputation.  This does offer a 1 surgery option but has increased risk for poor wound healing given her cellulitic changes that would be within the operative field as well as with her poorly controlled blood sugars.  After a lengthy discussion with the patient and her partner we will opt to move forward with a guillotine amputation today and then formal below-knee amputation likely this coming Tuesday.  Risks and benefits were reviewed all questions have been answered patient agreed to proceed.  2.  Diabetes mellitus: Continue hypoglycemic medications as already ordered, these medications have been reviewed and there are no changes at this time.  Hgb A1C to be monitored as already arranged by primary service  3.  COPD: Continue pulmonary medications and aerosols as already ordered, these medications have been reviewed and there are no changes at this time.    4.  Coronary artery disease: Continue cardiac and antihypertensive medications as already ordered and reviewed, no changes at this time.  Continue statin as ordered and reviewed, no changes at this time  Nitrates PRN for chest pain.    5.  Hyperlipidemia: Continue statin as ordered and reviewed, no changes at this time    [name], MD  09/19/2022 9:17 AM

## 2022-09-19 NOTE — Op Note (Addendum)
   OPERATIVE NOTE   PROCEDURE: Open guillotine amputation left foot Application of a hospital nondisposable VAC  PRE-OPERATIVE DIAGNOSIS: Left foot gangrene  POST-OPERATIVE DIAGNOSIS: same as above  SURGEON: Renford Dills, MD  ASSISTANT(S): Rolla Plate, NP  ANESTHESIA: general  ESTIMATED BLOOD LOSS: 50 cc  FINDING(S): No no frank purulence encountered  SPECIMEN(S):  Left ankle amputation  INDICATIONS:   Nancy [name] is a 57 y.o. female who presents with left leg gangrene.  The patient is scheduled for a left ankle guillotine amputation.  I discussed in depth with the patient the risks, benefits, and alternatives to this procedure.  The patient is aware that the risk of this operation included but are not limited to:  bleeding, infection, myocardial infarction, stroke, death, failure to heal amputation wound, and possible need for more proximal amputation.  The patient is aware of the risks and agrees proceed forward with the procedure.  DESCRIPTION:  After full informed written consent was obtained from the patient, the patient was brought back to the operating room, and placed supine upon the operating table.  Prior to induction, the patient received IV antibiotics.  The patient was then prepped and draped in the standard fashion for a guillotine amputation.  After obtaining adequate anesthesia, the patient was prepped and draped in the standard fashion for a left ankle guillotine amputation.    Circumferential incision was then made around the ankle with a 10 blade scalpel and carried down through the skin and soft tissues to expose the bones and the tendons.  Tendons were transected with Bovie cautery and the neurovascular bundle for the anterior tibial and posterior tibial arteries was identified and ligated with 3-0 Vicryl.  The oscillating saw was then used to transect the tibia as well as the fibula.  Bovie cautery completed transection of the soft tissues distally.   Peroneal neurovascular bundle was identified and ligated with 3-0 Vicryl.  Specimen was passed off the field.  The wound was then irrigated with sterile saline hemostasis was achieved with Bovie cautery.  Wound measures 6 cm x 8 cm.  A VAC dressing was applied this is a permanent VAC not a disposable VAC.  Patient tolerated procedure well and was taken to the recovery room in good condition  COMPLICATIONS: none  CONDITION: stable   [name]  09/19/2022, 1:10 PM    This note was created with Dragon Medical transcription system. Any errors in dictation are purely unintentional.

## 2022-09-19 NOTE — Inpatient Diabetes Management (Signed)
Inpatient Diabetes Program Recommendations  AACE/ADA: New Consensus Statement on Inpatient Glycemic Control   Target Ranges:  Prepandial:   less than 140 mg/dL      Peak postprandial:   less than 180 mg/dL (1-2 hours)      Critically ill patients:  140 - 180 mg/dL    Latest Reference Range & Units 09/18/22 21:37 09/19/22 08:02  Glucose-Capillary 70 - 99 mg/dL 161 (H) 096 (H)    Latest Reference Range & Units 09/18/22 13:48 09/19/22 04:17  Glucose 70 - 99 mg/dL 045 (H) 409 (H)   Review of Glycemic Control  Diabetes history: DM2 Outpatient Diabetes medications: 70/30 50 units BID, Metformin 1000 mg QAM, Metformin 1500 mg QPM, Trulicity 4.5 mg Qweek (not taking) Current orders for Inpatient glycemic control: Novolog 0-15 units TID with meals, Novolog 0-5 units at bedtime, Novolog 3 units TID with meals  Inpatient Diabetes Program Recommendations:    Insulin: CBG 304 mg/dl today. Please consider ordering Semglee 20 units daily.  Thanks, Orlando Penner, RN, MSN, CDCES Diabetes Coordinator Inpatient Diabetes Program [phone] (Team Pager from 8am to 5pm)

## 2022-09-19 NOTE — Plan of Care (Signed)
  Problem: Health Behavior/Discharge Planning: Goal: Ability to manage health-related needs will improve Outcome: Progressing   Problem: Education: Goal: Knowledge of General Education information will improve Description: Including pain rating scale, medication(s)/side effects and non-pharmacologic comfort measures Outcome: Progressing   Problem: Health Behavior/Discharge Planning: Goal: Ability to manage health-related needs will improve Outcome: Progressing   Problem: Activity: Goal: Risk for activity intolerance will decrease Outcome: Progressing   Problem: Nutrition: Goal: Adequate nutrition will be maintained Outcome: Progressing   Problem: Coping: Goal: Level of anxiety will decrease Outcome: Progressing   Problem: Elimination: Goal: Will not experience complications related to bowel motility Outcome: Progressing Goal: Will not experience complications related to urinary retention Outcome: Progressing   Problem: Pain Managment: Goal: General experience of comfort will improve Outcome: Progressing

## 2022-09-19 NOTE — H&P (View-Only) (Signed)
MRN : [id number]  Nancy [name] is a 57 y.o. (11-Sep-1965) female who presents with chief complaint of check circulation.  History of Present Illness:   Patient admitted to [facility] yesterday with signs and symptoms of sepsis secondary to a left ankle/heel wound.  Workup has demonstrated significant bony involvement as well as gas within the soft tissues.  Podiatry has evaluated and does not feel that the situation is salvageable.  Since admission the patient states she is feeling slightly better although her primary complaint remains the severe pain in her left ankle.  She denies shaking chills or spiking fevers.  Nausea is improved she is not exhibiting any systemic symptoms.  She was seen in the office approximately 1 year ago September 28, 2021.  ABIs at that time showed she was noncompressible due to medial calcification but the waveforms are triphasic on the right and biphasic on the left with digit pressures of 147 on the right and 119 on the left.   Current Meds  Medication Sig   acidophilus (RISAQUAD) CAPS capsule Take 1 capsule by mouth daily.   albuterol (PROVENTIL HFA;VENTOLIN HFA) 108 (90 Base) MCG/ACT inhaler Inhale 2 puffs into the lungs every 4 (four) hours as needed for wheezing or shortness of breath. Reported on 03/29/2015   albuterol (PROVENTIL) (2.5 MG/3ML) 0.083% nebulizer solution Take 2.5 mg by nebulization as needed.   ALPRAZolam (XANAX) 0.5 MG tablet TAKE 1/2 TO 1 TABLET BY MOUTH AS NEEDED FOR PANIC ATTACKS   ascorbic acid (VITAMIN C) 500 MG tablet Take 1 tablet (500 mg total) by mouth 2 (two) times daily.   aspirin EC 81 MG tablet Take 81 mg by mouth daily.   aspirin-acetaminophen-caffeine (EXCEDRIN MIGRAINE) 250-250-65 MG tablet Take 2 tablets by mouth every 6 (six) hours as needed for migraine.   carvedilol (COREG) 6.25 MG tablet TAKE 1 TABLET BY MOUTH TWICE A DAY    desvenlafaxine (PRISTIQ) 100 MG 24 hr tablet TAKE 1 TABLET BY MOUTH EVERY DAY   fenofibrate 54 MG tablet TAKE 1 TABLET BY MOUTH EVERY DAY   ibuprofen (ADVIL,MOTRIN) 800 MG tablet Take 800 mg by mouth every 8 (eight) hours as needed (for migraines).    insulin NPH-regular Human (70-30) 100 UNIT/ML injection Inject 50 Units into the skin 2 (two) times daily with a meal.   lisinopril (ZESTRIL) 5 MG tablet TAKE 1 TABLET BY MOUTH EVERY DAY (Patient taking differently: Take 5 mg by mouth 2 (two) times daily.)   metFORMIN (GLUCOPHAGE) 500 MG tablet TAKE TWO TABLETS EACH MORNING. AND 3 TABLETS IN THE EVENING AS DIRECTED   montelukast (SINGULAIR) 10 MG tablet TAKE 1 TABLET BY MOUTH EVERYDAY AT BEDTIME   nystatin (MYCOSTATIN/NYSTOP) powder APPLY TOPICALLY TWICE DAILY AS NEEDED   ondansetron (ZOFRAN-ODT) 4 MG disintegrating tablet Take 4 mg by mouth every 8 (eight) hours as needed.   pantoprazole (PROTONIX) 40 MG tablet TAKE 1 TABLET BY MOUTH EVERY DAY   pravastatin (PRAVACHOL) 40 MG tablet TAKE 1 TABLET BY MOUTH EVERY DAY   traMADol (ULTRAM) 50 MG tablet Take 50 mg by mouth  every 6 (six) hours as needed (for migraines).   zinc sulfate 220 (50 Zn) MG capsule Take 1 capsule (220 mg total) by mouth daily.    Past Medical History:  Diagnosis Date   Abnormal cardiovascular stress test 09/21/2018   Formatting of this note might be different from the original. Lexiscan Myoview 09/16/2018 revealed mild anterior ischemia   Adverse effect of motion 05/10/2014   Anxiety    Arthritis    r knee   Asthma    Breast cyst 05/10/2014   CAD (coronary artery disease)    a.) LHC 06/04/[facility]; non-obstructive CAD. b.) CTA with FFR 10/08/2018: extensive mixed plaque proximal to mid LAD (51-69%); Coronary Ca score 217; FFR 0.71 dPDA, 0.86 mLAD, 0.87 dLCx.   CCF (congestive cardiac failure) (HCC) 06/06/2008   a.) 30% EF. b.) TTE 06/03/2011: EF >55%; triv MR. c.) TTE 04/27/2018: EF 55%; mild LVH; triv PR, mild MR/TR;  G1DD.   Chest pain with high risk for cardiac etiology 07/02/2016   Chronic use of opiate drug for therapeutic purpose 02/13/2022   Formatting of this note might be different from the original. [facility] Pain Contract signed on 04/18/16 & updated 03/04/17; UDS done on 04/18/16.   Complication of anesthesia    Diabetic foot infection (HCC) 03/13/2015   Diabetic foot ulcer (HCC) 02/08/2021   Eczema    Family history of adverse reaction to anesthesia    a.) PONV in mother and grandmother   GERD (gastroesophageal reflux disease) 05/10/2014   History of kidney stones    HLD (hyperlipidemia)    Hyponatremia 03/13/2015   Migraines    Motion sickness    Other acute osteomyelitis, left ankle and foot (HCC) 02/13/2022   Panic attacks    Peripheral edema 04/27/2018   Pneumonia    PONV (postoperative nausea and vomiting)    Sepsis secondary to diabetic foot infection 03/13/2015   T2DM (type 2 diabetes mellitus) (HCC)    Takotsubo cardiomyopathy / transient apical balooning syndrome / stress-induced cardiomyopathy 05/10/2014   Unspecified essential hypertension     Past Surgical History:  Procedure Laterality Date   AMPUTATION TOE Left 03/16/2015   Procedure: left fifth toe amputation with incision and drainage;  Surgeon: Gwyneth Revels, DPM;  Location: [facility] OR[facility];  Service: Podiatry;  Laterality: Left;   APPLICATION OF WOUND VAC Left 02/14/2021   Procedure: APPLICATION OF WOUND VAC;  Surgeon: Gwyneth Revels, DPM;  Location: [facility];  Service: Podiatry;  Laterality: Left;   APPLICATION OF WOUND VAC Left 04/16/2021   Procedure: APPLICATION OF WOUND VAC;  Surgeon: Peggye Form, DO;  Location: [facility];  Service: Plastics;  Laterality: Left;   CARDIAC CATHETERIZATION Left 06/04/2009   Procedure: CARDIAC CATHETERIZATION; Location: [facility]   CHOLECYSTECTOMY     DEBRIDEMENT AND CLOSURE WOUND Left 04/16/2021   Procedure: DEBRIDEMENT AND CLOSURE WOUND;  Surgeon:  Peggye Form, DO;  Location:  SURGERY CENTER;  Service: Plastics;  Laterality: Left;  1 hour   IRRIGATION AND DEBRIDEMENT FOOT Left 02/08/2021   Procedure: IRRIGATION AND DEBRIDEMENT FOOT - LFT HEEL ULCER;  Surgeon: Gwyneth Revels, DPM;  Location: [facility];  Service: Podiatry;  Laterality: Left;   IRRIGATION AND DEBRIDEMENT FOOT Left 02/14/2021   Procedure: IRRIGATION AND DEBRIDEMENT LEFT HEEL;  Surgeon: Gwyneth Revels, DPM;  Location: [facility];  Service: Podiatry;  Laterality: Left;   kidney stone removal     KNEE ARTHROSCOPY W/ MENISCAL REPAIR     NOSE SURGERY  01/07/1989   due to fracture   TOE AMPUTATION     second toe   TONSILLECTOMY     TOTAL KNEE ARTHROPLASTY  01/11/2011   Procedure: TOTAL KNEE ARTHROPLASTY;  Surgeon: Harvie Junior;  Location: [facility];  Service: Orthopedics;  Laterality: Right;  COMPUTER ASSISTED TOTAL KNEE REPLACEMENT    Social History Social History   Tobacco Use   Smoking status: Former    Current packs/day: 0.00    Types: Cigarettes    Quit date: 03/12/1985    Years since quitting: 37.5   Smokeless tobacco: Never  Vaping Use   Vaping status: Never Used  Substance Use Topics   Alcohol use: No   Drug use: No    Family History Family History  Problem Relation Age of Onset   Transient ischemic attack Mother    Asthma Mother    Hypertension Mother    Lung cancer Father    Hypertension Father    Asthma Sister    Anxiety disorder Sister    Stomach cancer Maternal Grandmother    Colon cancer Maternal Grandmother    Leukemia Maternal Grandmother    Melanoma Maternal Grandfather    Asthma Sister    Hypertension Brother    Cancer Other    Crohn's disease Other    GER disease Other    Breast cancer Neg Hx     Allergies  Allergen Reactions   Shellfish Allergy Anaphylaxis   Codeine Other (See Comments)    Migraine  Other Reaction(s): Other (See Comments)  Reaction:  Severe migraines   Morphine And Codeine Other (See Comments)     Reaction:  Severe migraines    Percocet [Oxycodone-Acetaminophen] Nausea And Vomiting   Rosuvastatin     Other reaction(s): Muscle Pain   Sulfa Antibiotics Rash   Theophyllines Itching and Rash     REVIEW OF SYSTEMS (Negative unless checked)  Constitutional: [] Weight loss  [] Fever  [] Chills Cardiac: [] Chest pain   [] Chest pressure   [] Palpitations   [] Shortness of breath when laying flat   [] Shortness of breath with exertion. Vascular:  [x] Pain in legs with walking   [] Pain in legs at rest  [] History of DVT   [] Phlebitis   [] Swelling in legs   [] Varicose veins   [] Non-healing ulcers Pulmonary:   [] Uses home oxygen   [] Productive cough   [] Hemoptysis   [] Wheeze  [] COPD   [] Asthma Neurologic:  [] Dizziness   [] Seizures   [] History of stroke   [] History of TIA  [] Aphasia   [] Vissual changes   [] Weakness or numbness in arm   [] Weakness or numbness in leg Musculoskeletal:   [] Joint swelling   [] Joint pain   [] Low back pain Hematologic:  [] Easy bruising  [] Easy bleeding   [] Hypercoagulable state   [] Anemic Gastrointestinal:  [] Diarrhea   [] Vomiting  [] Gastroesophageal reflux/heartburn   [] Difficulty swallowing. Genitourinary:  [] Chronic kidney disease   [] Difficult urination  [] Frequent urination   [] Blood in urine Skin:  [] Rashes   [] Ulcers  Psychological:  [] History of anxiety   []  History of major depression.  Physical Examination  Vitals:   09/18/22 1836 09/18/22 2015 09/18/22 2358 09/19/22 0801  BP: (!) 180/72 (!) 125/53 (!) 139/56 136/60  Pulse: 94 90 87 93  Resp: 18 18 18 14   Temp: (!) 101.1 F (38.4 C) (!) 100.8 F (38.2 C) (!) 100.5 F (38.1 C) 99 F (37.2 C)  TempSrc: Oral     SpO2: 96% 97% 94% 96%  Weight:  Height:       Body mass index is 40.46 kg/m. Gen: WD/WN, NAD Head: [place]/AT, No temporalis wasting.  Ear/Nose/Throat: Hearing grossly intact, nares w/o erythema or drainage Eyes: PER, EOMI, sclera nonicteric.  Neck: Supple, no masses.  No bruit or JVD.   Pulmonary:  Good air movement, no audible wheezing, no use of accessory muscles.  Cardiac: RRR, normal S1, S2, no Murmurs. Vascular: Left ankle is wrapped in an Ace however there is 3+ edema of the ankle and distal calf area.  There is erythema of the skin which is extending up to the level of the gastroc muscles. Vessel Right Left  Radial Palpable Palpable  Gastrointestinal: soft, non-distended. No guarding/no peritoneal signs.  Musculoskeletal: M/S 5/5 throughout.  No visible deformity.  Neurologic: CN 2-12 intact. Pain and light touch intact in extremities.  Symmetrical.  Speech is fluent. Motor exam as listed above. Psychiatric: Judgment intact, Mood & affect appropriate for pt's clinical situation. Dermatologic: No rashes.  + changes consistent with cellulitis.   CBC Lab Results  Component Value Date   WBC 17.6 (H) 09/19/2022   HGB 8.9 (L) 09/19/2022   HCT 27.7 (L) 09/19/2022   MCV 89.6 09/19/2022   PLT 404 (H) 09/19/2022    BMET    Component Value Date/Time   NA 130 (L) 09/19/2022 0417   NA 132 (L) 08/07/2022 1210   NA 133 (L) 08/21/2012 1523   K 4.7 09/19/2022 0417   K 4.1 08/21/2012 1523   CL 97 (L) 09/19/2022 0417   CL 102 08/21/2012 1523   CO2 24 09/19/2022 0417   CO2 26 08/21/2012 1523   GLUCOSE 303 (H) 09/19/2022 0417   GLUCOSE 306 (H) 08/21/2012 1523   BUN 32 (H) 09/19/2022 0417   BUN 23 08/07/2022 1210   BUN 17 08/21/2012 1523   CREATININE 0.96 09/19/2022 0417   CREATININE 0.69 08/21/2012 1523   CALCIUM 8.4 (L) 09/19/2022 0417   CALCIUM 8.8 08/21/2012 1523   GFRNONAA >60 09/19/2022 0417   GFRNONAA >60 08/21/2012 1523   GFRAA 117 11/04/2016 1109   GFRAA >60 08/21/2012 1523   Estimated Creatinine Clearance: 91.2 mL/min (by C-G formula based on SCr of 0.96 mg/dL).  COAG Lab Results  Component Value Date   INR 1.5 (H) 09/18/2022   INR 1.2 02/07/2021   INR 1.21 03/13/2015    Radiology DG Foot Complete Left  Result Date: 09/18/2022 CLINICAL DATA:   Heel ulcer EXAM: LEFT FOOT - COMPLETE 3+ VIEW COMPARISON:  02/07/2021 radiographs of the ankle FINDINGS: Extensive vascular calcifications. Posterior heel ulcer with large amount of gas within the heel pad and posterior heel. Possible cortical bone resorption posterior calcaneal margin on lateral view. Patient is status post partial amputation of the second digit at the level of head of proximal phalanx and transmetatarsal amputation of fifth digit. Degenerative changes at the first MTP joint. IMPRESSION: Posterior heel ulcer with large amount of gas within the heel pad and posterior heel consistent with heel ulcer and necrotic infection. Possible cortical resorption/subtle osteomyelitis changes posterior calcaneus deep to the heel ulcer Electronically Signed   By: Jasmine Pang M.D.   On: 09/18/2022 17:17     Assessment/Plan 1.  Diabetic foot ulcer left lower extremity with cellulitis and sepsis: The patient has evidence of gas in the soft tissues as well as severe bony destruction consistent with osteomyelitis of the calcaneus.  Podiatry has evaluated her and there is really nothing further that can be offered for limb salvage.  Considering all the patient has been through both she and the podiatry service have done an incredible job of preserving her foot this long.  Given the markedly elevated blood sugars in association with the cellulitis extending into the potential operative field I have discussed with the patient 2 options.  1.  Proceed with guillotine amputation today and then allowing for further antibiotics and eradication of the infection as well as better control of her blood sugars.  Then moving forward in 4 to 5 days with a formal below-knee amputation.  Although this is a more conservative option requiring 2 surgeries clearly offers the best chance of healing and overall a more rapid recovery with a successful below-knee amputation.  2.  Wait for another 24 hours and then move forward with a  below-knee amputation.  This does offer a 1 surgery option but has increased risk for poor wound healing given her cellulitic changes that would be within the operative field as well as with her poorly controlled blood sugars.  After a lengthy discussion with the patient and her partner we will opt to move forward with a guillotine amputation today and then formal below-knee amputation likely this coming Tuesday.  Risks and benefits were reviewed all questions have been answered patient agreed to proceed.  2.  Diabetes mellitus: Continue hypoglycemic medications as already ordered, these medications have been reviewed and there are no changes at this time.  Hgb A1C to be monitored as already arranged by primary service  3.  COPD: Continue pulmonary medications and aerosols as already ordered, these medications have been reviewed and there are no changes at this time.    4.  Coronary artery disease: Continue cardiac and antihypertensive medications as already ordered and reviewed, no changes at this time.  Continue statin as ordered and reviewed, no changes at this time  Nitrates PRN for chest pain.    5.  Hyperlipidemia: Continue statin as ordered and reviewed, no changes at this time    [name], MD  09/19/2022 9:17 AM

## 2022-09-19 NOTE — Transfer of Care (Signed)
Immediate Anesthesia Transfer of Care Note  Patient: Nancy [name]  Procedure(s) Performed: AMPUTATION BELOW KNEE (Left: Knee)  Patient Location: PACU  Anesthesia Type:General  Level of Consciousness: awake  Airway & Oxygen Therapy: Patient Spontanous Breathing and Patient connected to face mask oxygen  Post-op Assessment: Report given to RN and Post -op Vital signs reviewed and stable  Post vital signs: Reviewed and stable  Last Vitals:  Vitals Value Taken Time  BP 124/58 09/19/22 1317  Temp    Pulse 85 09/19/22 1323  Resp 19 09/19/22 1323  SpO2 99 % 09/19/22 1323  Vitals shown include unfiled device data.  Last Pain:  Vitals:   09/19/22 0728  TempSrc:   PainSc: 4       Patients Stated Pain Goal: 2 (09/18/22 1822)  Complications: No notable events documented.

## 2022-09-19 NOTE — Progress Notes (Addendum)
Progress Note   Patient: Nancy [name] OEV:[id number] DOB: [birth year] DOA: 09/18/2022     1 DOS: the patient was seen and examined on 09/19/2022   Brief hospital course: Nancy [name] is a 57 y.o. female with medical history significant of type 2 diabetes, CAD, asthma, chronic pain, diabetic foot infection, GERD, hyperlipidemia, Takotsubo cardiomyopathy presented with worsening left foot pain over the past 2 to 3 days. In the ED, Tmax 101.2, heart rate 90s, respiration stable, blood pressure 160s to 170s over 60s to 70s. Satting well on room air. White count 20, hemoglobin 10.5, platelets 460, sed rate 80, creatinine 0.97 glucose 300s. Pancultured in the ER. Lactate 2.1. Left heel imaging with posterior heel ulcer with large amount of gas with the heel pad and posterior heel consistent with heel ulcer and necrotic infection. Positive osteomyelitic changes. Podiatry and vascular surgery evaluated her.  Patient is admitted to the hospitalist service for sepsis secondary to diabetic foot infection, osteomyelitis.  Assessment and Plan: Sepsis secondary to diabetic foot infection Osteomyelitis (HCC) Staphylococcus aureus Bacteremia Sepsis criteria by fever, white count, lactate  Worsening L heel pain w/ gas gangrene secondary to posterior heel ulceration with osteomyelitis on imaging  Vascular surgery and podiatry evaluated the patient w/ plan for amputation left foot today. Continue broad spectrum antibiotics IV cefepime, flagyl and vancomycin. I discussed with pharmacy team, ID evaluation for Staph bacteremia. Echo ordered.   Follow final Blood cultures, sensitivities. Appreciate podiatry and vascular surgery recommendations.  Diabetes mellitus, type 2 (HCC) Poorly controlled Blood sugar in 300s A1c 2 months ago 10.6. Discussed with diabetes educator, started [employer] daily. Continue sliding-scale insulin  Takotsubo cardiomyopathy Baseline history of transient Takotsubo  cardiomyopathy Appears euvolemic at present No active chest pain.  Pseudohyponatremia: Due to her high blood sugars. Continue to trend sodium, correction of blood sugars.  Acute on chronic anemia: In the setting of sepsis. Continue to monitor H&H closely post amputation surgery. Check iron, folate, ferritin, b12 levels.  Mixed hyperlipidemia Stable Continue statin  Asthma without status asthmaticus Stable from respiratory standpoint Continue home inhalers  Essential hypertension BP stable Resumed Coreg, Lisinopril.  Morbid obesity with BMI 40.46: Contributing to her current condition. Diet, exercise and lifestyle modification advised. Weight reduction advised.     Subjective: Patient is seen and examined today morning.  She is weak, has poor appetite, not eating since last 4 days.  Tmax 101.2.  Patient is awaiting amputation surgery.  Family at bedside.  Physical Exam: Vitals:   09/19/22 1330 09/19/22 1345 09/19/22 1420 09/19/22 1548  BP: 118/72 120/78 (!) 123/58 (!) 113/44  Pulse: 87 79 76 78  Resp: 18 20 14 14   Temp:  97.7 F (36.5 C) 98.4 F (36.9 C) 98.4 F (36.9 C)  TempSrc:      SpO2: 100% 96% 100% 95%  Weight:      Height:       Exam Data Reviewed:     Latest Ref Rng & Units 09/19/2022    4:17 AM 09/18/2022    1:48 PM 08/07/2022   12:10 PM  CBC  WBC 4.0 - 10.5 K/uL 17.6  19.9  9.2   Hemoglobin 12.0 - 15.0 g/dL 8.9  82.9  93.7   Hematocrit 36.0 - 46.0 % 27.7  33.8  38.6   Platelets 150 - 400 K/uL 404  460  320        Latest Ref Rng & Units 09/19/2022    4:17 AM 09/18/2022  1:48 PM 08/07/2022   12:10 PM  BMP  Glucose 70 - 99 mg/dL 604  540  981   BUN 6 - 20 mg/dL 32  28  23   Creatinine 0.44 - 1.00 mg/dL 1.91  4.78  2.95   BUN/Creat Ratio 9 - 23   27   Sodium 135 - 145 mmol/L 130  130  132   Potassium 3.5 - 5.1 mmol/L 4.7  5.0  4.9   Chloride 98 - 111 mmol/L 97  94  98   CO2 22 - 32 mmol/L 24  23  25    Calcium 8.9 - 10.3 mg/dL 8.4  9.0   9.4    DG Foot Complete Left  Result Date: 09/18/2022 CLINICAL DATA:  Heel ulcer EXAM: LEFT FOOT - COMPLETE 3+ VIEW COMPARISON:  02/07/2021 radiographs of the ankle FINDINGS: Extensive vascular calcifications. Posterior heel ulcer with large amount of gas within the heel pad and posterior heel. Possible cortical bone resorption posterior calcaneal margin on lateral view. Patient is status post partial amputation of the second digit at the level of head of proximal phalanx and transmetatarsal amputation of fifth digit. Degenerative changes at the first MTP joint. IMPRESSION: Posterior heel ulcer with large amount of gas within the heel pad and posterior heel consistent with heel ulcer and necrotic infection. Possible cortical resorption/subtle osteomyelitis changes posterior calcaneus deep to the heel ulcer Electronically Signed   By: Jasmine Pang M.D.   On: 09/18/2022 17:17    Family Communication: Patient's family at bedside, agree with current care plan  Disposition: Status is: Inpatient Remains inpatient appropriate because: amputation surgery  Planned Discharge Destination: Home with Home Health and Rehab   MDM level 3-patient has sepsis, diabetic foot infection, osteomyelitis, uncontrolled blood sugars.  Patient is currently on broad-spectrum triple antibiotic coverage.  Patient is waiting to undergo amputation surgery and she is at high risk for clinical deterioration.  Author: Marcelino Duster, MD 09/19/2022 5:09 PM  For on call review [email or web address].

## 2022-09-20 ENCOUNTER — Telehealth: Payer: Self-pay

## 2022-09-20 ENCOUNTER — Encounter: Payer: Self-pay | Admitting: Vascular Surgery

## 2022-09-20 DIAGNOSIS — R7881 Bacteremia: Secondary | ICD-10-CM | POA: Diagnosis not present

## 2022-09-20 DIAGNOSIS — E11621 Type 2 diabetes mellitus with foot ulcer: Secondary | ICD-10-CM | POA: Diagnosis not present

## 2022-09-20 DIAGNOSIS — B9561 Methicillin susceptible Staphylococcus aureus infection as the cause of diseases classified elsewhere: Secondary | ICD-10-CM | POA: Diagnosis present

## 2022-09-20 DIAGNOSIS — L03116 Cellulitis of left lower limb: Secondary | ICD-10-CM | POA: Diagnosis not present

## 2022-09-20 DIAGNOSIS — E875 Hyperkalemia: Secondary | ICD-10-CM

## 2022-09-20 DIAGNOSIS — A480 Gas gangrene: Secondary | ICD-10-CM | POA: Diagnosis not present

## 2022-09-20 DIAGNOSIS — E1100 Type 2 diabetes mellitus with hyperosmolarity without nonketotic hyperglycemic-hyperosmolar coma (NKHHC): Secondary | ICD-10-CM | POA: Diagnosis not present

## 2022-09-20 DIAGNOSIS — A419 Sepsis, unspecified organism: Secondary | ICD-10-CM | POA: Diagnosis not present

## 2022-09-20 HISTORY — DX: Methicillin susceptible Staphylococcus aureus infection as the cause of diseases classified elsewhere: B95.61

## 2022-09-20 LAB — CBC
HCT: 27.5 % — ABNORMAL LOW (ref 36.0–46.0)
Hemoglobin: 8.6 g/dL — ABNORMAL LOW (ref 12.0–15.0)
MCH: 28.5 pg (ref 26.0–34.0)
MCHC: 31.3 g/dL (ref 30.0–36.0)
MCV: 91.1 fL (ref 80.0–100.0)
Platelets: 447 10*3/uL — ABNORMAL HIGH (ref 150–400)
RBC: 3.02 MIL/uL — ABNORMAL LOW (ref 3.87–5.11)
RDW: 13.6 % (ref 11.5–15.5)
WBC: 12.7 10*3/uL — ABNORMAL HIGH (ref 4.0–10.5)
nRBC: 0 % (ref 0.0–0.2)

## 2022-09-20 LAB — SURGICAL PATHOLOGY

## 2022-09-20 LAB — BASIC METABOLIC PANEL
Anion gap: 11 (ref 5–15)
BUN: 45 mg/dL — ABNORMAL HIGH (ref 6–20)
CO2: 21 mmol/L — ABNORMAL LOW (ref 22–32)
Calcium: 8.2 mg/dL — ABNORMAL LOW (ref 8.9–10.3)
Chloride: 99 mmol/L (ref 98–111)
Creatinine, Ser: 1.1 mg/dL — ABNORMAL HIGH (ref 0.44–1.00)
GFR, Estimated: 59 mL/min — ABNORMAL LOW (ref >60)
Glucose, Bld: 447 mg/dL — ABNORMAL HIGH (ref 70–99)
Potassium: 5.6 mmol/L — ABNORMAL HIGH (ref 3.5–5.1)
Sodium: 131 mmol/L — ABNORMAL LOW (ref 135–145)

## 2022-09-20 LAB — GLUCOSE, CAPILLARY
Glucose-Capillary: 406 mg/dL — ABNORMAL HIGH (ref 70–99)
Glucose-Capillary: 343 mg/dL — ABNORMAL HIGH (ref 70–99)
Glucose-Capillary: 271 mg/dL — ABNORMAL HIGH (ref 70–99)
Glucose-Capillary: 258 mg/dL — ABNORMAL HIGH (ref 70–99)

## 2022-09-20 LAB — VITAMIN B12: Vitamin B-12: 5871 pg/mL — ABNORMAL HIGH (ref 180–914)

## 2022-09-20 MED ORDER — INSULIN GLARGINE-YFGN 100 UNIT/ML ~~LOC~~ SOLN
30.0000 [IU] | Freq: Every day | SUBCUTANEOUS | Status: DC
  Administered 2022-09-20 – 2022-09-25 (×5): 30 [IU] via SUBCUTANEOUS
  Filled 2022-09-20 (×6): qty 0.3

## 2022-09-20 MED ORDER — INSULIN ASPART 100 UNIT/ML IJ SOLN
8.0000 [IU] | Freq: Three times a day (TID) | INTRAMUSCULAR | Status: DC
  Administered 2022-09-20 – 2022-09-30 (×30): 8 [IU] via SUBCUTANEOUS
  Filled 2022-09-20 (×30): qty 1

## 2022-09-20 MED ORDER — SODIUM ZIRCONIUM CYCLOSILICATE 10 G PO PACK
10.0000 g | PACK | Freq: Every day | ORAL | Status: AC
  Administered 2022-09-20 – 2022-09-21 (×2): 10 g via ORAL
  Filled 2022-09-20 (×2): qty 1

## 2022-09-20 MED ORDER — SODIUM CHLORIDE 0.9 % IV SOLN
200.0000 mg | INTRAVENOUS | Status: AC
  Administered 2022-09-20 – 2022-09-22 (×3): 200 mg via INTRAVENOUS
  Filled 2022-09-20 (×3): qty 200

## 2022-09-20 NOTE — Progress Notes (Signed)
Progress Note    09/20/2022 9:27 AM 1 Day Post-Op  Subjective:  Nancy [name] is a 57 yo female now POD #1 open guillotine amputation of the left foot.  Patient is resting comfortably in bed this morning.  She does endorse some pain to the lower extremity but not as bad as preamputation.  Patient endorses physical therapy tried to help her yesterday to get to the commode at the bedside but it was difficult.  She endorses wanting to work with PT soon as possible.  No other complaints overnight.  Wound VAC is in place and working well.  Vitals all remained stable.   Vitals:   09/20/22 0432 09/20/22 0754  BP: 127/66 (!) 130/53  Pulse: 62 (!) 59  Resp: 18 18  Temp: (!) 97.5 F (36.4 C) (!) 97.3 F (36.3 C)  SpO2: 96% 96%   Physical Exam: Cardiac:  RRR, normal S1 and S2.  No rubs or gallops or murmurs. Lungs: Clear on auscultation throughout.  Normal nonlabored breathing.  No rales, rhonchi or wheezing. Incisions: Left lower extremity guillotine amputation of left foot.  Wound VAC is in place and working well. Extremities: Left lower extremity foot guillotine amputation.  Right lower extremity with palpable pulses. Abdomen: Positive bowel sounds throughout, soft, nontender and nondistended. Neurologic: Alert and oriented x 4, follows commands and answers questions appropriately.  Tearful today post procedure.  CBC    Component Value Date/Time   WBC 12.7 (H) 09/20/2022 0411   RBC 3.02 (L) 09/20/2022 0411   HGB 8.6 (L) 09/20/2022 0411   HGB 12.3 08/07/2022 1210   HCT 27.5 (L) 09/20/2022 0411   HCT 38.6 08/07/2022 1210   PLT 447 (H) 09/20/2022 0411   PLT 320 08/07/2022 1210   MCV 91.1 09/20/2022 0411   MCV 90 08/07/2022 1210   MCV 90 08/21/2012 1523   MCH 28.5 09/20/2022 0411   MCHC 31.3 09/20/2022 0411   RDW 13.6 09/20/2022 0411   RDW 11.8 08/07/2022 1210   RDW 13.4 08/21/2012 1523   LYMPHSABS 2.4 08/07/2022 1210   LYMPHSABS 2.7 06/05/2011 0550   MONOABS 0.9 02/13/2021  0323   MONOABS 0.4 06/05/2011 0550   EOSABS 0.5 (H) 08/07/2022 1210   EOSABS 0.6 06/05/2011 0550   BASOSABS 0.1 08/07/2022 1210   BASOSABS 0.1 06/05/2011 0550    BMET    Component Value Date/Time   NA 131 (L) 09/20/2022 0411   NA 132 (L) 08/07/2022 1210   NA 133 (L) 08/21/2012 1523   K 5.6 (H) 09/20/2022 0411   K 4.1 08/21/2012 1523   CL 99 09/20/2022 0411   CL 102 08/21/2012 1523   CO2 21 (L) 09/20/2022 0411   CO2 26 08/21/2012 1523   GLUCOSE 447 (H) 09/20/2022 0411   GLUCOSE 306 (H) 08/21/2012 1523   BUN 45 (H) 09/20/2022 0411   BUN 23 08/07/2022 1210   BUN 17 08/21/2012 1523   CREATININE 1.10 (H) 09/20/2022 0411   CREATININE 0.69 08/21/2012 1523   CALCIUM 8.2 (L) 09/20/2022 0411   CALCIUM 8.8 08/21/2012 1523   GFRNONAA 59 (L) 09/20/2022 0411   GFRNONAA >60 08/21/2012 1523   GFRAA 117 11/04/2016 1109   GFRAA >60 08/21/2012 1523    INR    Component Value Date/Time   INR 1.5 (H) 09/18/2022 1845     Intake/Output Summary (Last 24 hours) at 09/20/2022 0927 Last data filed at 09/20/2022 0600 Gross per 24 hour  Intake 1740 ml  Output 700 ml  Net 1040  ml     Assessment/Plan:  57 y.o. female is s/p open guillotine amputation of the left foot.  1 Day Post-Op   PLAN: Plan is to complete left below the knee amputation on Tuesday, 09/24/2022.  We will keep wound VAC in place until then to help reduce infection and swelling. Continue to work with PT/OT OOB as much as possible with assistance. Pain medications as needed.  DVT prophylaxis: Lovenox 60 mg subcu every 24 hours.   Marcie [facility] 09/20/2022 9:27 AM

## 2022-09-20 NOTE — TOC Progression Note (Signed)
Transition of Care [facility]) - Progression Note    Patient Details  Name: Nancy [name] MRN: [id number] Date of Birth: Dec 14, 1965  [facility]) CM/SW Contact  Marlowe Sax, RN Phone Number: 09/20/2022, 9:21 AM  Clinical Narrative:    TOC continues to follow and will assist with DC planning and needs, BKA planned for Tuesday    Expected Discharge Plan: Home w Home Health Services Barriers to Discharge: Continued Medical Work up  Expected Discharge Plan and Services   Discharge Planning Services: CM Consult   Living arrangements for the past 2 months: Single Family Home                 DME Arranged: N/A DME Agency: NA                   Social Determinants of Health (SDOH) Interventions SDOH Screenings   Depression (PHQ2-9): Low Risk  (05/31/2021)  Tobacco Use: Medium Risk (09/18/2022)    Readmission Risk Interventions     No data to display

## 2022-09-20 NOTE — Progress Notes (Signed)
Progress Note   Patient: Nancy [name]:[id number] DOB: 1965-01-14 DOA: 09/18/2022     2 DOS: the patient was seen and examined on 09/20/2022   Brief hospital course: Nancy [name] is a 57 y.o. female with medical history significant of type 2 diabetes, CAD, asthma, chronic pain, diabetic foot infection, GERD, hyperlipidemia, Takotsubo cardiomyopathy presented with worsening left foot pain over the past 2 to 3 days. In the ED, Tmax 101.2, heart rate 90s, respiration stable, blood pressure 160s to 170s over 60s to 70s. Satting well on room air. White count 20, hemoglobin 10.5, platelets 460, sed rate 80, creatinine 0.97 glucose 300s. Pancultured in the ER. Lactate 2.1. Left heel imaging with posterior heel ulcer with large amount of gas with the heel pad and posterior heel consistent with heel ulcer and necrotic infection. Positive osteomyelitic changes. Podiatry and vascular surgery evaluated her.  Patient is admitted to the hospitalist service for sepsis secondary to diabetic foot infection, osteomyelitis. 09/19/22 she had left ankle amputation with plan for BKA coming Tuesday.   Assessment and Plan: Sepsis secondary to diabetic foot infection Osteomyelitis (HCC) Sepsis criteria by fever, white count, lactate. Fever, white count improved. Worsening L heel pain w/ gas gangrene secondary to posterior heel ulceration with osteomyelitis on imaging  Vascular surgery performed open guillotine amputation left foot 09/19/22. Wound vac placed. Plan to take her for Left BKA on Tuesday.  Staphylococcus aureus Bacteremia Continue antibiotics Ancef, flagyl. ID consulted.  Follow final Blood cultures, sensitivities. Echo ordered.  Diabetes mellitus, type 2 (HCC) Poorly controlled Blood sugar in 400s A1c 2 months ago 10.6. Semglee increased to 30units daily, pre-meal to 8 units TID. Continue accucheks, sliding-scale insulin.  Hyperkalemia- One dose lokelma ordered. Stop  Lisinopril. Trend potassium. Mild kidney dysfunction noted. Avoid nephrotoxic drugs.  Takotsubo cardiomyopathy Baseline history of transient Takotsubo cardiomyopathy Appears euvolemic at present No active chest pain.  Pseudohyponatremia: Due to her high blood sugars. Continue to trend sodium, correction of blood sugars.  Acute on chronic anemia: In the setting of sepsis, iron deficiency Hb dropped to 8.6 post amputation surgery. Monitor H/H, transfuse for Hb less than 7. Will give IV iron x 3 days, oral supplementation upon dc.  Mixed hyperlipidemia Stable Continue statin  Asthma without status asthmaticus Stable from respiratory standpoint Continue home inhalers  Essential hypertension BP stable Continue Core. Hold Lisinopril for high K  Morbid obesity with BMI 40.46: Contributing to her current condition. Diet, exercise and lifestyle modification advised. Weight reduction advised.     Subjective: Patient is seen and examined today morning.  She did not get out of bed. Left foot amputation site with wound vac noted. Pain better. Does not wish to take Morphine. No family at bedside.   Physical Exam: Vitals:   09/20/22 0157 09/20/22 0432 09/20/22 0709 09/20/22 0754  BP: 121/65 127/66  (!) 130/53  Pulse: 64 62  (!) 59  Resp: 16 18  18   Temp: (!) 97.5 F (36.4 C) (!) 97.5 F (36.4 C)  (!) 97.3 F (36.3 C)  TempSrc:      SpO2: 97% 96%  96%  Weight:   124 kg   Height:       General -middle aged obese Caucasian female, no apparent distress HEENT - PERRLA, EOMI, atraumatic head, non tender sinuses. Lung - Clear, rales, rhonchi, wheezes. Heart - S1, S2 heard, no murmurs, rubs, no pedal edema. Abdomen-soft, obese, nontender, bowel sounds good Neuro - Alert, awake and oriented x 3, non focal  exam. Skin - Warm and dry.  Left foot amputation site wound VAC noted.  Data Reviewed:     Latest Ref Rng & Units 09/20/2022    4:11 AM 09/19/2022    4:17 AM 09/18/2022     1:48 PM  CBC  WBC 4.0 - 10.5 K/uL 12.7  17.6  19.9   Hemoglobin 12.0 - 15.0 g/dL 8.6  8.9  16.1   Hematocrit 36.0 - 46.0 % 27.5  27.7  33.8   Platelets 150 - 400 K/uL 447  404  460        Latest Ref Rng & Units 09/20/2022    4:11 AM 09/19/2022   11:01 PM 09/19/2022    4:17 AM  BMP  Glucose 70 - 99 mg/dL 096  045  409   BUN 6 - 20 mg/dL 45   32   Creatinine 8.11 - 1.00 mg/dL 9.14   7.82   Sodium 956 - 145 mmol/L 131   130   Potassium 3.5 - 5.1 mmol/L 5.6   4.7   Chloride 98 - 111 mmol/L 99   97   CO2 22 - 32 mmol/L 21   24   Calcium 8.9 - 10.3 mg/dL 8.2   8.4    DG Foot Complete Left  Result Date: 09/18/2022 CLINICAL DATA:  Heel ulcer EXAM: LEFT FOOT - COMPLETE 3+ VIEW COMPARISON:  02/07/2021 radiographs of the ankle FINDINGS: Extensive vascular calcifications. Posterior heel ulcer with large amount of gas within the heel pad and posterior heel. Possible cortical bone resorption posterior calcaneal margin on lateral view. Patient is status post partial amputation of the second digit at the level of head of proximal phalanx and transmetatarsal amputation of fifth digit. Degenerative changes at the first MTP joint. IMPRESSION: Posterior heel ulcer with large amount of gas within the heel pad and posterior heel consistent with heel ulcer and necrotic infection. Possible cortical resorption/subtle osteomyelitis changes posterior calcaneus deep to the heel ulcer Electronically Signed   By: Jasmine Pang M.D.   On: 09/18/2022 17:17    Family Communication: Discussed with patient's family yesterday, they agree with current care plan  Disposition: Status is: Inpatient Remains inpatient appropriate because: amputation surgery  Planned Discharge Destination: Home with Home Health and Rehab   MDM level 3-patient presented with sepsis, diabetic foot infection, osteomyelitis, bacteremia, uncontrolled blood sugars.  Patient is currently on broad-spectrum antibiotic coverage.  She had amputation  surgery and she is at high risk for clinical deterioration.  Author: Marcelino Duster, MD 09/20/2022 11:22 AM  For on call review [email or web address].

## 2022-09-20 NOTE — Plan of Care (Signed)
  Problem: Health Behavior/Discharge Planning: Goal: Ability to manage health-related needs will improve Outcome: Progressing   Problem: Clinical Measurements: Goal: Cardiovascular complication will be avoided Outcome: Progressing   Problem: Activity: Goal: Risk for activity intolerance will decrease Outcome: Progressing   Problem: Nutrition: Goal: Adequate nutrition will be maintained Outcome: Progressing   Problem: Coping: Goal: Level of anxiety will decrease Outcome: Progressing   Problem: Elimination: Goal: Will not experience complications related to bowel motility Outcome: Progressing Goal: Will not experience complications related to urinary retention Outcome: Progressing   Problem: Pain Managment: Goal: General experience of comfort will improve Outcome: Progressing   Problem: Safety: Goal: Ability to remain free from injury will improve Outcome: Progressing

## 2022-09-20 NOTE — Inpatient Diabetes Management (Signed)
Inpatient Diabetes Program Recommendations  AACE/ADA: New Consensus Statement on Inpatient Glycemic Control  Target Ranges:  Prepandial:   less than 140 mg/dL      Peak postprandial:   less than 180 mg/dL (1-2 hours)      Critically ill patients:  140 - 180 mg/dL    Latest Reference Range & Units 09/19/22 08:02 09/19/22 11:55 09/19/22 13:22 09/19/22 16:38 09/19/22 21:18 09/20/22 07:55  Glucose-Capillary 70 - 99 mg/dL 409 (H) 811 (H) 914 (H) 325 (H) 409 (H) 406 (H)   Review of Glycemic Control  Diabetes history: DM2 Outpatient Diabetes medications: 70/30 50 units BID, Metformin 1000 mg QAM, Metformin 1500 mg QPM, Trulicity 4.5 mg Qweek (not taking) Current orders for Inpatient glycemic control: Semglee 30 units daily, Novolog 0-15 units TID with meals, Novolog 0-5 units at bedtime, Novolog 8 units TID with meals  Inpatient Diabetes Program Recommendations:    Insulin: Noted Semglee increased to 30 units daily and meal coverage increased to Novolog 8 units TID. If CBGs continue to be consistently over 180 mg/dl with changes, please consider increasing Semglee to 40 units daily and meal coverage to Novolog 10 units TID with meals.  Thanks, Orlando Penner, RN, MSN, CDCES Diabetes Coordinator Inpatient Diabetes Program [phone] (Team Pager from 8am to 5pm)

## 2022-09-20 NOTE — Plan of Care (Signed)

## 2022-09-20 NOTE — Consult Note (Signed)
Date of Admission:  09/18/2022          Reason for Consult: MSSA bacteremia secondary to gangrene, osteomyelitis of left lower extremity sp  guillotine amputation     Referring Provider: [facility] and Marcelino Duster, MD   Assessment:  MSSA bacteremia secondary to Gangrenous heel infection with calcaneal osteomyelitis status post guillotine amputation with plans for below the knee amputation next week Diabetes mellitus Remote history of Takotsubo cardiomyopathy   Plan:  Narrowed to cefazolin 2D echocardiogram and she will need a transesophageal echocardiogram potentially this could be timed with her vascular surgery for below the knee amputation Repeat blood cultures to ensure clearance of her bacteremia DO NOT Place any long term IV access until we have assured clearance of her bacteremia Monitor for metastatic sites of infection  Partner Dr. Luciana Axe is available for questions this weekend and will follow-up on her blood culture data.  I will return on Monday.  Active Problems:   Essential hypertension   Asthma without status asthmaticus   Mixed hyperlipidemia   Takotsubo cardiomyopathy   Diabetes mellitus, type 2 (HCC)   Sepsis secondary to diabetic foot infection   Diabetic ulcer of left heel associated with type 2 diabetes mellitus (HCC)   Diabetic foot infection (HCC)   Osteomyelitis (HCC)   Cellulitis of left lower extremity   Gas gangrene (HCC)   Scheduled Meds:  vitamin C  500 mg Oral BID   carvedilol  6.25 mg Oral BID   chlorhexidine  60 mL Topical Once   enoxaparin (LOVENOX) injection  60 mg Subcutaneous Q24H   insulin aspart  0-15 Units Subcutaneous TID WC   insulin aspart  0-5 Units Subcutaneous QHS   insulin aspart  8 Units Subcutaneous TID WC   insulin glargine-yfgn  30 Units Subcutaneous Daily   multivitamin with minerals  1 tablet Oral Daily   pantoprazole  40 mg Oral Daily   Ensure Max Protein  11 oz Oral BID   sodium  zirconium cyclosilicate  10 g Oral Daily   Continuous Infusions:   ceFAZolin (ANCEF) IV 2 g (09/20/22 1410)   iron sucrose 200 mg (09/20/22 0850)   metronidazole 500 mg (09/20/22 0958)   PRN Meds:.acetaminophen **OR** acetaminophen, ALPRAZolam, fentaNYL (SUBLIMAZE) injection, HYDROmorphone (DILAUDID) injection, ondansetron (ZOFRAN) IV, oxyCODONE-acetaminophen  HPI: Nancy [name] is a 57 y.o. female history of diabetes mellitus prior osteomyelitis status post surgery and courses of antibiotics having been followed by Dr. Rivka Safer closely who was admitted with fevers malaise found to have a necrotic gangrenous infection in her left lower extremity where imaging had shown in the heel pad consistent with necrotic infection and cortical resorption osteomyelitis in the posterior calcaneus.  Blood cultures were drawn which have subsequent back for MSSA.  She has undergone guillotine amputation with plans for below the knee amputation next week to facilitate ultimate fitting with a prosthesis.  She has been on broad-spectrum antibiotics which we have narrowed to cefazolin.  I have ordered repeat blood cultures.  Echocardiogram has been ordered but not yet performed.  She will need a transesophageal echocardiogram to exclude endocarditis.  I think it would be wise if this could be timed with vascular surgeries below the knee amputation next week.  She currently has no other potential sites of "metastatic infection based on her symptoms and exam.  She is counseled to monitor for any such symptoms.  If her repeat blood cultures today are still positive will  need to repeat them.  We will need to ensure clearance of her bacteremia prior to consideration of placement of any durable line such as PICC line  I have personally spent 82 minutes involved in face-to-face and non-face-to-face activities for this patient on the day of the visit. Professional time spent includes the following  activities: Preparing to see the patient (review of tests), Obtaining and/or reviewing separately obtained history (admission/discharge record), Performing a medically appropriate examination and/or evaluation , Ordering medications/tests/procedures, referring and communicating with other health care professionals, Documenting clinical information in the EMR, Independently interpreting results (not separately reported), Communicating results to the patient/family/caregiver, Counseling and educating the patient/family/caregiver and Care coordination (not separately reported).     Review of Systems: Review of Systems  Constitutional:  Positive for fever. Negative for chills, malaise/fatigue and weight loss.  HENT:  Negative for congestion and sore throat.   Eyes:  Negative for blurred vision and photophobia.  Respiratory:  Negative for cough, shortness of breath and wheezing.   Cardiovascular:  Negative for chest pain, palpitations and leg swelling.  Gastrointestinal:  Negative for abdominal pain, blood in stool, constipation, diarrhea, heartburn, melena, nausea and vomiting.  Genitourinary:  Negative for dysuria, flank pain and hematuria.  Musculoskeletal:  Positive for joint pain. Negative for back pain, falls and myalgias.  Skin:  Negative for itching and rash.  Neurological:  Positive for weakness. Negative for dizziness, focal weakness, loss of consciousness and headaches.  Endo/Heme/Allergies:  Does not bruise/bleed easily.  Psychiatric/Behavioral:  Negative for depression and suicidal ideas. The patient does not have insomnia.     Past Medical History:  Diagnosis Date   Abnormal cardiovascular stress test 09/21/2018   Formatting of this note might be different from the original. Lexiscan Myoview 09/16/2018 revealed mild anterior ischemia   Adverse effect of motion 05/10/2014   Anxiety    Arthritis    r knee   Asthma    Breast cyst 05/10/2014   CAD (coronary artery disease)    a.)  LHC 06/04/2009 at [facility]; non-obstructive CAD. b.) CTA with FFR 10/08/2018: extensive mixed plaque proximal to mid LAD (51-69%); Coronary Ca score 217; FFR 0.71 dPDA, 0.86 mLAD, 0.87 dLCx.   CCF (congestive cardiac failure) (HCC) 06/06/2008   a.) 30% EF. b.) TTE 06/03/2011: EF >55%; triv MR. c.) TTE 04/27/2018: EF 55%; mild LVH; triv PR, mild MR/TR; G1DD.   Chest pain with high risk for cardiac etiology 07/02/2016   Chronic use of opiate drug for therapeutic purpose 02/13/2022   Formatting of this note might be different from the original. [facility] Pain Contract signed on 04/18/16 & updated 03/04/17; UDS done on 04/18/16.   Complication of anesthesia    Diabetic foot infection (HCC) 03/13/2015   Diabetic foot ulcer (HCC) 02/08/2021   Eczema    Family history of adverse reaction to anesthesia    a.) PONV in mother and grandmother   GERD (gastroesophageal reflux disease) 05/10/2014   History of kidney stones    HLD (hyperlipidemia)    Hyponatremia 03/13/2015   Migraines    Motion sickness    Other acute osteomyelitis, left ankle and foot (HCC) 02/13/2022   Panic attacks    Peripheral edema 04/27/2018   Pneumonia    PONV (postoperative nausea and vomiting)    Sepsis secondary to diabetic foot infection 03/13/2015   T2DM (type 2 diabetes mellitus) (HCC)    Takotsubo cardiomyopathy / transient apical balooning syndrome / stress-induced cardiomyopathy 05/10/2014   Unspecified essential hypertension  Social History   Tobacco Use   Smoking status: Former    Current packs/day: 0.00    Types: Cigarettes    Quit date: 03/12/1985    Years since quitting: 37.5   Smokeless tobacco: Never  Vaping Use   Vaping status: Never Used  Substance Use Topics   Alcohol use: No   Drug use: No    Family History  Problem Relation Age of Onset   Transient ischemic attack Mother    Asthma Mother    Hypertension Mother    Lung cancer Father    Hypertension Father    Asthma Sister    Anxiety disorder  Sister    Stomach cancer Maternal Grandmother    Colon cancer Maternal Grandmother    Leukemia Maternal Grandmother    Melanoma Maternal Grandfather    Asthma Sister    Hypertension Brother    Cancer Other    Crohn's disease Other    GER disease Other    Breast cancer Neg Hx    Allergies  Allergen Reactions   Shellfish Allergy Anaphylaxis   Codeine Other (See Comments)    Migraine  Other Reaction(s): Other (See Comments)  Reaction:  Severe migraines   Morphine And Codeine Other (See Comments)    Reaction:  Severe migraines    Percocet [Oxycodone-Acetaminophen] Nausea And Vomiting   Rosuvastatin     Other reaction(s): Muscle Pain   Sulfa Antibiotics Rash   Theophyllines Itching and Rash    OBJECTIVE: Blood pressure (!) 157/64, pulse 74, temperature 98.3 F (36.8 C), resp. rate 18, height 5\' 9"  (1.753 m), weight 124 kg, last menstrual period 02/08/2013, SpO2 96%.  Physical Exam Constitutional:      General: She is not in acute distress.    Appearance: Normal appearance. She is well-developed. She is not ill-appearing or diaphoretic.  HENT:     Head: Normocephalic and atraumatic.     Right Ear: Hearing and external ear normal.     Left Ear: Hearing and external ear normal.     Nose: No nasal deformity or rhinorrhea.  Eyes:     General: No scleral icterus.    Conjunctiva/sclera: Conjunctivae normal.     Right eye: Right conjunctiva is not injected.     Left eye: Left conjunctiva is not injected.     Pupils: Pupils are equal, round, and reactive to light.  Neck:     Vascular: No JVD.  Cardiovascular:     Rate and Rhythm: Normal rate and regular rhythm.     Heart sounds: S1 normal and S2 normal.  Pulmonary:     Effort: Pulmonary effort is normal. No respiratory distress.     Breath sounds: No wheezing.  Abdominal:     General: Bowel sounds are normal. There is no distension.     Palpations: Abdomen is soft.     Tenderness: There is no abdominal tenderness.   Musculoskeletal:     Right shoulder: Normal.     Left shoulder: Normal.     Cervical back: Normal range of motion and neck supple.     Right hip: Normal.     Left hip: Normal.     Right knee: Normal.     Left knee: Normal.  Lymphadenopathy:     Head:     Right side of head: No submandibular, preauricular or posterior auricular adenopathy.     Left side of head: No submandibular, preauricular or posterior auricular adenopathy.     Cervical: No cervical adenopathy.  Right cervical: No superficial or deep cervical adenopathy.    Left cervical: No superficial or deep cervical adenopathy.  Skin:    General: Skin is warm and dry.     Coloration: Skin is not pale.     Findings: No abrasion, bruising, ecchymosis, erythema, lesion or rash.     Nails: There is no clubbing.  Neurological:     General: No focal deficit present.     Mental Status: She is alert and oriented to person, place, and time.     Sensory: No sensory deficit.     Coordination: Coordination normal.     Gait: Gait normal.  Psychiatric:        Attention and Perception: She is attentive.        Mood and Affect: Mood normal.        Speech: Speech normal.        Behavior: Behavior normal. Behavior is cooperative.        Thought Content: Thought content normal.        Judgment: Judgment normal.    Left lower extremity with vacuum dressing  Lab Results Lab Results  Component Value Date   WBC 12.7 (H) 09/20/2022   HGB 8.6 (L) 09/20/2022   HCT 27.5 (L) 09/20/2022   MCV 91.1 09/20/2022   PLT 447 (H) 09/20/2022    Lab Results  Component Value Date   CREATININE 1.10 (H) 09/20/2022   BUN 45 (H) 09/20/2022   NA 131 (L) 09/20/2022   K 5.6 (H) 09/20/2022   CL 99 09/20/2022   CO2 21 (L) 09/20/2022    Lab Results  Component Value Date   ALT 19 09/19/2022   AST 17 09/19/2022   ALKPHOS 87 09/19/2022   BILITOT 0.9 09/19/2022     Microbiology: Recent Results (from the past 240 hour(s))  Blood culture (routine  x 2)     Status: Abnormal (Preliminary result)   Collection Time: 09/18/22  3:30 PM   Specimen: BLOOD  Result Value Ref Range Status   Specimen Description   Final    BLOOD RIGHT ANTECUBITAL Performed at [facility], [address], [place], [place] [address]    Special Requests   Final    BOTTLES DRAWN AEROBIC AND ANAEROBIC Blood Culture adequate volume Performed at [facility], [address] [place], [place] [address]    Culture  Setup Time   Final    GRAM POSITIVE COCCI IN BOTH AEROBIC AND ANAEROBIC BOTTLES CRITICAL RESULT CALLED TO, READ BACK BY AND VERIFIED WITH: ALEX CHAPPELL 09/19/22 0816 KLW Performed at [facility], [address], [place], [place] [address]    Culture STAPHYLOCOCCUS AUREUS (A)  Final   Report Status PENDING  Incomplete  Blood Culture ID Panel (Reflexed)     Status: Abnormal   Collection Time: 09/18/22  3:30 PM  Result Value Ref Range Status   Enterococcus faecalis NOT DETECTED NOT DETECTED Final   Enterococcus Faecium NOT DETECTED NOT DETECTED Final   Listeria monocytogenes NOT DETECTED NOT DETECTED Final   Staphylococcus species DETECTED (A) NOT DETECTED Final    Comment: CRITICAL RESULT CALLED TO, READ BACK BY AND VERIFIED WITH: ALEX CHAPPELL 09/19/22 0816 KLW    Staphylococcus aureus (BCID) DETECTED (A) NOT DETECTED Final    Comment: CRITICAL RESULT CALLED TO, READ BACK BY AND VERIFIED WITH: ALEX CHAPPELL 09/19/22 0816 KLW    Staphylococcus epidermidis NOT DETECTED NOT DETECTED Final   Staphylococcus lugdunensis NOT DETECTED NOT DETECTED Final  Streptococcus species NOT DETECTED NOT DETECTED Final   Streptococcus agalactiae NOT DETECTED NOT DETECTED Final   Streptococcus pneumoniae NOT DETECTED NOT DETECTED Final   Streptococcus pyogenes NOT DETECTED NOT DETECTED Final   A.calcoaceticus-baumannii NOT DETECTED NOT DETECTED Final   Bacteroides fragilis NOT DETECTED NOT DETECTED Final   Enterobacterales NOT DETECTED  NOT DETECTED Final   Enterobacter cloacae complex NOT DETECTED NOT DETECTED Final   Escherichia coli NOT DETECTED NOT DETECTED Final   Klebsiella aerogenes NOT DETECTED NOT DETECTED Final   Klebsiella oxytoca NOT DETECTED NOT DETECTED Final   Klebsiella pneumoniae NOT DETECTED NOT DETECTED Final   Proteus species NOT DETECTED NOT DETECTED Final   Salmonella species NOT DETECTED NOT DETECTED Final   Serratia marcescens NOT DETECTED NOT DETECTED Final   Haemophilus influenzae NOT DETECTED NOT DETECTED Final   Neisseria meningitidis NOT DETECTED NOT DETECTED Final   Pseudomonas aeruginosa NOT DETECTED NOT DETECTED Final   Stenotrophomonas maltophilia NOT DETECTED NOT DETECTED Final   Candida albicans NOT DETECTED NOT DETECTED Final   Candida auris NOT DETECTED NOT DETECTED Final   Candida glabrata NOT DETECTED NOT DETECTED Final   Candida krusei NOT DETECTED NOT DETECTED Final   Candida parapsilosis NOT DETECTED NOT DETECTED Final   Candida tropicalis NOT DETECTED NOT DETECTED Final   Cryptococcus neoformans/gattii NOT DETECTED NOT DETECTED Final   Meth resistant mecA/C and MREJ NOT DETECTED NOT DETECTED Final    Comment: Performed at [facility], [address] [place], [place] [address]  Aerobic/Anaerobic Culture w Gram Stain (surgical/deep wound)     Status: None (Preliminary result)   Collection Time: 09/18/22  3:50 PM   Specimen: Heel; Wound  Result Value Ref Range Status   Specimen Description   Final    HEEL Performed at [facility], [address] [place], [place] [address]    Special Requests   Final    NONE Performed at [facility], [address] [place], [place] [address]    Gram Stain   Final    NO WBC SEEN MODERATE GRAM POSITIVE COCCI IN PAIRS Performed at [facility], [address], [place], [place] [address]    Culture   Final    MODERATE STAPHYLOCOCCUS AUREUS CULTURE REINCUBATED FOR BETTER GROWTH SUSCEPTIBILITIES TO  FOLLOW NO ANAEROBES ISOLATED; CULTURE IN PROGRESS FOR 5 DAYS    Report Status PENDING  Incomplete  Blood culture (routine x 2)     Status: Abnormal (Preliminary result)   Collection Time: 09/18/22  3:55 PM   Specimen: BLOOD  Result Value Ref Range Status   Specimen Description   Final    BLOOD LEFT ANTECUBITAL Performed at [facility], [address] [place], [place] [address]    Special Requests   Final    BOTTLES DRAWN AEROBIC AND ANAEROBIC Blood Culture results may not be optimal due to an excessive volume of blood received in culture bottles Performed at [facility], [address] [place], [place] [address]    Culture  Setup Time   Final    AEROBIC BOTTLE ONLY GRAM POSITIVE COCCI CRITICAL VALUE NOTED.  VALUE IS CONSISTENT WITH PREVIOUSLY REPORTED AND CALLED VALUE. Performed at [facility], [address] [place], [place] [address]    Culture STAPHYLOCOCCUS AUREUS (A)  Final   Report Status PENDING  Incomplete  Culture, blood (Routine X 2) w Reflex to ID Panel     Status: None (Preliminary result)   Collection  Time: 09/19/22  9:13 PM   Specimen: BLOOD RIGHT ARM  Result Value Ref Range Status   Specimen Description BLOOD RIGHT ARM  Final   Special Requests   Final    BOTTLES DRAWN AEROBIC AND ANAEROBIC Blood Culture adequate volume   Culture   Final    NO GROWTH < 12 HOURS Performed at [facility], [address], [place], [place] [address]    Report Status PENDING  Incomplete  Culture, blood (Routine X 2) w Reflex to ID Panel     Status: None (Preliminary result)   Collection Time: 09/19/22  9:15 PM   Specimen: BLOOD LEFT ARM  Result Value Ref Range Status   Specimen Description BLOOD LEFT ARM  Final   Special Requests   Final    BOTTLES DRAWN AEROBIC AND ANAEROBIC Blood Culture adequate volume   Culture   Final    NO GROWTH < 12 HOURS Performed at [facility], [address], [place], [place] [address]    Report  Status PENDING  Incomplete    Acey Lav, MD [facility] for Infectious [facility] [phone] pager  09/20/2022, 3:59 PM

## 2022-09-21 ENCOUNTER — Inpatient Hospital Stay
Admit: 2022-09-21 | Discharge: 2022-09-21 | Disposition: A | Discharge status: Home / Self Care | Payer: 59 | Attending: Vascular Surgery | Admitting: Vascular Surgery

## 2022-09-21 DIAGNOSIS — B9561 Methicillin susceptible Staphylococcus aureus infection as the cause of diseases classified elsewhere: Secondary | ICD-10-CM | POA: Diagnosis not present

## 2022-09-21 DIAGNOSIS — R7881 Bacteremia: Secondary | ICD-10-CM | POA: Diagnosis not present

## 2022-09-21 LAB — CBC
HCT: 26.2 % — ABNORMAL LOW (ref 36.0–46.0)
Hemoglobin: 8.7 g/dL — ABNORMAL LOW (ref 12.0–15.0)
MCH: 28.8 pg (ref 26.0–34.0)
MCHC: 33.2 g/dL (ref 30.0–36.0)
MCV: 86.8 fL (ref 80.0–100.0)
Platelets: 498 10*3/uL — ABNORMAL HIGH (ref 150–400)
RBC: 3.02 MIL/uL — ABNORMAL LOW (ref 3.87–5.11)
RDW: 13.7 % (ref 11.5–15.5)
WBC: 11.4 10*3/uL — ABNORMAL HIGH (ref 4.0–10.5)
nRBC: 0 % (ref 0.0–0.2)

## 2022-09-21 LAB — BASIC METABOLIC PANEL
Anion gap: 6 (ref 5–15)
BUN: 51 mg/dL — ABNORMAL HIGH (ref 6–20)
CO2: 25 mmol/L (ref 22–32)
Calcium: 8.5 mg/dL — ABNORMAL LOW (ref 8.9–10.3)
Chloride: 102 mmol/L (ref 98–111)
Creatinine, Ser: 1.09 mg/dL — ABNORMAL HIGH (ref 0.44–1.00)
GFR, Estimated: 59 mL/min — ABNORMAL LOW (ref >60)
Glucose, Bld: 352 mg/dL — ABNORMAL HIGH (ref 70–99)
Potassium: 4.7 mmol/L (ref 3.5–5.1)
Sodium: 133 mmol/L — ABNORMAL LOW (ref 135–145)

## 2022-09-21 LAB — GLUCOSE, CAPILLARY
Glucose-Capillary: 319 mg/dL — ABNORMAL HIGH (ref 70–99)
Glucose-Capillary: 292 mg/dL — ABNORMAL HIGH (ref 70–99)
Glucose-Capillary: 309 mg/dL — ABNORMAL HIGH (ref 70–99)
Glucose-Capillary: 206 mg/dL — ABNORMAL HIGH (ref 70–99)
Glucose-Capillary: 193 mg/dL — ABNORMAL HIGH (ref 70–99)

## 2022-09-21 MED ORDER — HYDROMORPHONE HCL 2 MG PO TABS
2.0000 mg | ORAL_TABLET | ORAL | Status: DC | PRN
  Administered 2022-09-21 – 2022-09-26 (×14): 2 mg via ORAL
  Filled 2022-09-21 (×15): qty 1

## 2022-09-21 MED ORDER — NALOXONE HCL 0.4 MG/ML IJ SOLN: 0.4000 mg | INTRAMUSCULAR | Status: DC | PRN

## 2022-09-21 NOTE — Plan of Care (Signed)
  Problem: Education: Goal: Ability to describe self-care measures that may prevent or decrease complications (Diabetes Survival Skills Education) will improve Outcome: Progressing   Problem: Coping: Goal: Ability to adjust to condition or change in health will improve Outcome: Progressing   Problem: Fluid Volume: Goal: Ability to maintain a balanced intake and output will improve Outcome: Progressing   Problem: Health Behavior/Discharge Planning: Goal: Ability to identify and utilize available resources and services will improve Outcome: Progressing   Problem: Activity: Goal: Risk for activity intolerance will decrease Outcome: Not Progressing   Problem: Pain Managment: Goal: General experience of comfort will improve Outcome: Not Progressing

## 2022-09-21 NOTE — Progress Notes (Signed)
[facility]  PROGRESS NOTE  ASSESSMENT / PLAN: Nancy [name] is a 57 y.o. female status post guillotine amputation of left lower extremity. Doing well overall. Plan formal below knee amputation on Tuesday 09/24/22 with Dr. Wyn Quaker   SUBJECTIVE: No complaints. Understandably emotional about what has happened to her. Pain is well controlled. I offered reassurance.   OBJECTIVE: BP (!) 154/62   Pulse 73   Temp 97.7 F (36.5 C) (Oral)   Resp 18   Ht 5\' 9"  (1.753 m)   Wt 124 kg   LMP 02/08/2013 (Within Months)   SpO2 94%   BMI 40.37 kg/m   Intake/Output Summary (Last 24 hours) at 09/21/2022 0827 Last data filed at 09/21/2022 0657 Gross per 24 hour  Intake 1472.79 ml  Output 1400 ml  Net 72.79 ml    No complaints. Regular rate and rhythm Unlabored breathing Left amputation stump appears healthy VAC in place with good seal     Latest Ref Rng & Units 09/21/2022    4:36 AM 09/20/2022    4:11 AM 09/19/2022    4:17 AM  CBC  WBC 4.0 - 10.5 K/uL 11.4  12.7  17.6   Hemoglobin 12.0 - 15.0 g/dL 8.7  8.6  8.9   Hematocrit 36.0 - 46.0 % 26.2  27.5  27.7   Platelets 150 - 400 K/uL 498  447  404         Latest Ref Rng & Units 09/21/2022    4:36 AM 09/20/2022    4:11 AM 09/19/2022   11:01 PM  CMP  Glucose 70 - 99 mg/dL 161  096  045   BUN 6 - 20 mg/dL 51  45    Creatinine 4.09 - 1.00 mg/dL 8.11  9.14    Sodium 782 - 145 mmol/L 133  131    Potassium 3.5 - 5.1 mmol/L 4.7  5.6    Chloride 98 - 111 mmol/L 102  99    CO2 22 - 32 mmol/L 25  21    Calcium 8.9 - 10.3 mg/dL 8.5  8.2      Estimated Creatinine Clearance: 80.3 mL/min (A) (by C-G formula based on SCr of 1.09 mg/dL (H)).  Rande Brunt. Lenell Antu, MD [facility] Phone Number: [phone] 09/21/2022 8:27 AM

## 2022-09-21 NOTE — Progress Notes (Signed)
  Echocardiogram 2D Echocardiogram has NOT been performed today. The patient is working with PT and will try to complete this test in the morning on 09/22/22.  Lenor Coffin 09/21/2022, 2:07 PM

## 2022-09-21 NOTE — Plan of Care (Signed)
  Problem: Education: Goal: Ability to describe self-care measures that may prevent or decrease complications (Diabetes Survival Skills Education) will improve Outcome: Progressing   Problem: Elimination: Goal: Will not experience complications related to bowel motility Outcome: Progressing   Problem: Activity: Goal: Risk for activity intolerance will decrease Outcome: Progressing   Problem: Pain Managment: Goal: General experience of comfort will improve Outcome: Progressing   Problem: Skin Integrity: Goal: Risk for impaired skin integrity will decrease Outcome: Progressing   Problem: Safety: Goal: Ability to remain free from injury will improve Outcome: Progressing

## 2022-09-21 NOTE — Evaluation (Signed)
Occupational Therapy Evaluation Patient Details Name: Nancy [name] MRN: [id number] DOB: [birth year] Today's Date: 09/21/2022   History of Present Illness Nancy [name] is a 57 y.o. female who presents with left leg gangrene. 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 PMH includes: anxiety, CAD, DMII, CCF.   Clinical Impression   Ms. Zullo was seen for OT/PT co-evaluation this date. Prior to hospital admission, pt was MOD I for BADL management. She endorses using a shower chair for bathing and a 4WW for HH mobility. Her spouse is present t/o evaluation and reports assisting with IADL management and wound care regularly. Pt lives with her wife in a 1 level home with a ramped entrance. Pt presents to acute OT demonstrating impaired ADL performance and functional mobility 2/2 generalized weakness, decreased balance, and increased pain 2/2 LLE amputation (See OT problem list for additional functional deficits). Pt currently requires +2 MOD A for functional transfers, LB dressing from STS, and toilet transfers. See ADL section below for additional detail. Pt would benefit from skilled OT services to address noted impairments and functional limitations (see below for any additional details) in order to maximize safety and independence while minimizing falls risk and caregiver burden. Anticipate the need for follow up therapy services upon acute hospital DC (>/=3 hours /day).        If plan is discharge home, recommend the following: Two people to help with walking and/or transfers;Two people to help with bathing/dressing/bathroom;Assistance with cooking/housework;Help with stairs or ramp for entrance;Assist for transportation    Functional Status Assessment  Patient has had a recent decline in their functional status and demonstrates the ability to make significant improvements in function in a reasonable and  predictable amount of time.  Equipment Recommendations  Other (comment) (defer to next venue of care)    Recommendations for Other Services Rehab consult     Precautions / Restrictions Precautions Precautions: Fall Restrictions Weight Bearing Restrictions: Yes LLE Weight Bearing: Non weight bearing Other Position/Activity Restrictions: wound vac on LLE      Mobility Bed Mobility Overal bed mobility: Needs Assistance Bed Mobility: Supine to Sit, Sit to Supine     Supine to sit: Contact guard Sit to supine: Contact guard assist        Transfers Overall transfer level: Needs assistance Equipment used: Rolling walker (2 wheels) Transfers: Sit to/from Stand, Bed to chair/wheelchair/BSC Sit to Stand: Mod assist, +2 physical assistance   Squat pivot transfers: Mod assist, +2 physical assistance       General transfer comment: Requires multiple attempts to power up to stand with cueing for hand/foot placement during each transfer. Fatigues as session progresses, requires increased assist to safely lower onto EOB when attempting to squat pivot from BSC to EOB (Turning toward L side). Becomes significantly afraid of falling. Yells out "I'm falling, I'm falling" despite staff and family assurances of safety. Creats increased falls risk 2/2 anxiety over falling.      Balance Overall balance assessment: Needs assistance Sitting-balance support: Feet supported, No upper extremity supported Sitting balance-Leahy Scale: Fair     Standing balance support: Reliant on assistive device for balance, During functional activity, Bilateral upper extremity supported Standing balance-Leahy Scale: Poor Standing balance comment: Heavy reliance on RW                           ADL either performed or assessed with clinical  judgement   ADL Overall ADL's : Needs assistance/impaired                                       General ADL Comments: Pt is functionally  limited by generalized weakness, increased pain with mobility and decreased functional use of her LLE. She requires +2 MOD A for STS and squat pivot t/f to<>from BSC. CGA for peri-care using lateral leans. CGA for bed mobility and to perform lateral scoots toward HOB. Anticipate MOD A with +2 assist for STS LB dressing and bathing.     Vision Patient Visual Report: No change from baseline       Perception         Praxis         Pertinent Vitals/Pain Pain Assessment Pain Assessment: 0-10 Pain Score: 6  Pain Location: LLE Pain Descriptors / Indicators: Aching, Sore, Discomfort Pain Intervention(s): Limited activity within patient's tolerance, Monitored during session, Repositioned, Premedicated before session     Extremity/Trunk Assessment Upper Extremity Assessment Upper Extremity Assessment: Generalized weakness (grossly weak 3+ to 4/5 t/o. No focal deficit appreciated.)   Lower Extremity Assessment Lower Extremity Assessment: LLE deficits/detail LLE Deficits / Details: s/p L foot amputation with bandage/wound vac intact at start/end of session LLE Coordination: decreased fine motor;decreased gross motor   Cervical / Trunk Assessment Cervical / Trunk Assessment: Normal   Communication Communication Communication: No apparent difficulties Cueing Techniques: Verbal cues;Tactile cues   Cognition Arousal: Alert Behavior During Therapy: WFL for tasks assessed/performed, Anxious Overall Cognitive Status: Within Functional Limits for tasks assessed                                 General Comments: Awake/alert, conversational. Becomes anxious with mobility attempts and endorses significant fear of falling during session.     General Comments       Exercises Other Exercises Other Exercises: Pt/family members educated on role of OT/PT in acute setting, safety, falls prevention strategies for home and hospital, residual limb management techniques, and early  post-operative prosthetic preparation techniques.   Shoulder Instructions      Home Living Family/patient expects to be discharged to:: Private residence Living Arrangements: Spouse/significant other Available Help at Discharge: Family Type of Home: House Home Access: Ramped entrance     Home Layout: One level     Bathroom Shower/Tub: Walk-in shower         Home Equipment: Agricultural consultant (2 wheels);Shower seat;Grab bars - toilet;Grab bars - tub/shower;Hand held shower head;Rollator (4 wheels);Wheelchair - power          Prior Functioning/Environment Prior Level of Function : Independent/Modified Independent             Mobility Comments: Using [id number] for HH mobility. has an electric WC for community distances. Standing brief priods of time with UE suopport. ADLs Comments: Wife assists with wound care and IADL management, but pt otherwise MOD I for BADL management.        OT Problem List: Decreased strength;Decreased coordination;Pain;Decreased activity tolerance;Decreased safety awareness;Impaired balance (sitting and/or standing);Decreased knowledge of use of DME or AE;Decreased knowledge of precautions      OT Treatment/Interventions: Self-care/ADL training;Therapeutic exercise;Therapeutic activities;DME and/or AE instruction;Patient/family education;Balance training;Energy conservation;Neuromuscular education    OT Goals(Current goals can be found in the care plan section) Acute Rehab OT Goals Patient Stated Goal:  To get stronger so I can walk again OT Goal Formulation: With patient Time For Goal Achievement: 10/05/22 Potential to Achieve Goals: Good ADL Goals Pt Will Perform Grooming: sitting;with modified independence Pt Will Perform Lower Body Dressing: sitting/lateral leans;sit to/from stand;with supervision;with set-up;with caregiver independent in assisting Pt Will Transfer to Toilet: bedside commode;stand pivot transfer;with transfer board;with min  assist Pt Will Perform Toileting - Clothing Manipulation and hygiene: with adaptive equipment;sitting/lateral leans;with supervision;with set-up;with caregiver independent in assisting  OT Frequency: Min 1X/week    Co-evaluation              AM-PAC OT "6 Clicks" Daily Activity     Outcome Measure Help from another person eating meals?: None Help from another person taking care of personal grooming?: A Little Help from another person toileting, which includes using toliet, bedpan, or urinal?: A Lot Help from another person bathing (including washing, rinsing, drying)?: A Lot Help from another person to put on and taking off regular upper body clothing?: A Little Help from another person to put on and taking off regular lower body clothing?: A Lot 6 Click Score: 16   End of Session Equipment Utilized During Treatment: Gait belt;Rolling walker (2 wheels) Nurse Communication: Mobility status  Activity Tolerance: Patient tolerated treatment well Patient left: in bed;with call bell/phone within reach;with bed alarm set;with family/visitor present  OT Visit Diagnosis: Other abnormalities of gait and mobility (R26.89);Muscle weakness (generalized) (M62.81);Pain Pain - Right/Left: Left Pain - part of body: Knee;Leg;Ankle and joints of foot                Time: 1332-1415 OT Time Calculation (min): 43 min Charges:  OT General Charges $OT Visit: 1 Visit OT Evaluation $OT Eval Moderate Complexity: 1 Mod OT Treatments $Self Care/Home Management : 8-22 mins  Rockney Ghee, M.S., OTR/L 09/21/22, 3:02 PM

## 2022-09-21 NOTE — Progress Notes (Signed)
Inpatient Rehab Admissions Coordinator:  ? ?Per therapy recommendations,  patient was screened for CIR candidacy by Devaney Segers, MS, CCC-SLP. At this time, Pt. Appears to be a a potential candidate for CIR. I will place   order for rehab consult per protocol for full assessment. Please contact me any with questions. ? ?Trine Fread, MS, CCC-SLP ?Rehab Admissions Coordinator  ?[phone] (celll) ?[phone] (office) ? ?

## 2022-09-21 NOTE — Evaluation (Signed)
Physical Therapy Evaluation Patient Details Name: Nancy [name] MRN: [id number] DOB: Apr 11, 1965 Today's Date: 09/21/2022  History of Present Illness  Nancy [name] is a 57 y.o. female who presents with left leg gangrene. 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 PMH includes: anxiety, CAD, DMII, CCF.  Clinical Impression  Pt admitted with above diagnosis. Pt currently with functional limitations due to the deficits listed below (see PT Problem List). Pt received upright in bed agreeable to PT/OT co-eval for pt/therapist safety. Supportive family at bedside. Pt and spouse reports ADA compliant home and ambulates at baseline with rollator. Mod-I for ADL's/IADL's at baseline with spouse support PRN.   To date pt educated on importance of maintaining knee and hip flexibility for future gait mechanics for pre prosthetic training. Pt CGA for bed mobility this date and modA+2 for STS efforts and stand pivot to<> from bed and BSC. Pt initially needing some steadying in standing and does well with pivoting on RLE to [facility]. Pt with significantly more difficulty transferring from [facility] to bed towards amputated side with anxiety attack leading to quick need for bringing bed behind pt for sitting. Pt supervision to then side scoot towards Hob and CGA to return to supine. All needs in reach. Pt would benefit from intensive PT prior to transition home to maximize independence and reduced caregiver burden.          If plan is discharge home, recommend the following: Two people to help with walking and/or transfers;Two people to help with bathing/dressing/bathroom;Help with stairs or ramp for entrance;Assist for transportation;Assistance with cooking/housework   Can travel by private vehicle        Equipment Recommendations Other (comment) (TBD by next venue of care)  Recommendations for Other Services       Functional  Status Assessment Patient has had a recent decline in their functional status and demonstrates the ability to make significant improvements in function in a reasonable and predictable amount of time.     Precautions / Restrictions Precautions Precautions: Fall Restrictions Weight Bearing Restrictions: Yes LLE Weight Bearing: Non weight bearing Other Position/Activity Restrictions: wound vac on LLE      Mobility  Bed Mobility Overal bed mobility: Needs Assistance Bed Mobility: Supine to Sit, Sit to Supine     Supine to sit: Contact guard Sit to supine: Contact guard assist     Patient Response: Cooperative, Anxious  Transfers Overall transfer level: Needs assistance Equipment used: Rolling walker (2 wheels) Transfers: Sit to/from Stand, Bed to chair/wheelchair/BSC Sit to Stand: Mod assist, +2 physical assistance     Squat pivot transfers: Mod assist, +2 physical assistance     General transfer comment: Requires multiple attempts to power up to stand with cueing for hand/foot placement during each transfer. Fatigues as session progresses, requires increased assist to safely lower onto EOB when attempting to squat pivot from BSC to EOB (Turning toward L side). Becomes significantly afraid of falling. Yells out "I'm falling, I'm falling" despite staff and family assurances of safety. Creates increased falls risk 2/2 anxiety over falling.    Ambulation/Gait                  Stairs            Wheelchair Mobility     Tilt Bed Tilt Bed Patient Response: Cooperative, Anxious  Modified Rankin (Stroke Patients Only)       Balance Overall balance assessment:  Needs assistance Sitting-balance support: Feet supported, No upper extremity supported Sitting balance-Leahy Scale: Fair     Standing balance support: Reliant on assistive device for balance, During functional activity, Bilateral upper extremity supported Standing balance-Leahy Scale: Poor Standing  balance comment: Heavy reliance on RW                             Pertinent Vitals/Pain Pain Assessment Pain Assessment: 0-10 Pain Score: 6  Pain Location: LLE Pain Descriptors / Indicators: Aching, Sore, Discomfort Pain Intervention(s): Limited activity within patient's tolerance, Monitored during session, Repositioned, Premedicated before session    Home Living Family/patient expects to be discharged to:: Private residence Living Arrangements: Spouse/significant other Available Help at Discharge: Family Type of Home: House Home Access: Ramped entrance       Home Layout: One level Home Equipment: Agricultural consultant (2 wheels);Shower seat;Grab bars - toilet;Grab bars - tub/shower;Hand held shower head;Rollator (4 wheels);Wheelchair - power      Prior Function Prior Level of Function : Independent/Modified Independent             Mobility Comments: Using [id number] for HH mobility. has an electric WC for community distances. Standing brief priods of time with UE support. ADLs Comments: Wife assists with wound care and IADL management, but pt otherwise MOD I for BADL management.     Extremity/Trunk Assessment   Upper Extremity Assessment Upper Extremity Assessment: Generalized weakness    Lower Extremity Assessment Lower Extremity Assessment: LLE deficits/detail LLE Deficits / Details: s/p L foot amputation with bandage/wound vac intact at start/end of session. Has 0 deg of TKE LLE Coordination: decreased fine motor;decreased gross motor    Cervical / Trunk Assessment Cervical / Trunk Assessment: Normal  Communication   Communication Communication: No apparent difficulties Cueing Techniques: Verbal cues;Tactile cues  Cognition Arousal: Alert Behavior During Therapy: WFL for tasks assessed/performed, Anxious Overall Cognitive Status: Within Functional Limits for tasks assessed                                 General Comments: Awake/alert,  conversational. Becomes anxious with mobility attempts and endorses significant fear of falling during session.        General Comments      Exercises General Exercises - Lower Extremity Quad Sets: AROM, Left, 5 reps, Supine Straight Leg Raises: AROM, Left, 5 reps, Supine Other Exercises Other Exercises: Role of PT in acute setting, d/c recs, pre prosthetic therex and flexibility exercises.   Assessment/Plan    PT Assessment Patient needs continued PT services  PT Problem List Decreased strength;Pain;Decreased activity tolerance;Decreased balance;Decreased mobility       PT Treatment Interventions DME instruction;Balance training;Gait training;Neuromuscular re-education;Stair training;Functional mobility training;Patient/family education;Therapeutic activities;Therapeutic exercise    PT Goals (Current goals can be found in the Care Plan section)  Acute Rehab PT Goals Patient Stated Goal: Improve mobility PT Goal Formulation: With patient/family Time For Goal Achievement: 10/05/22 Potential to Achieve Goals: Good    Frequency Min 1X/week     Co-evaluation PT/OT/SLP Co-Evaluation/Treatment: Yes Reason for Co-Treatment: Complexity of the patient's impairments (multi-system involvement);Necessary to address cognition/behavior during functional activity;For patient/therapist safety;To address functional/ADL transfers PT goals addressed during session: Mobility/safety with mobility;Proper use of DME;Strengthening/ROM OT goals addressed during session: ADL's and self-care;Strengthening/ROM       AM-PAC PT "6 Clicks" Mobility  Outcome Measure Help needed turning from your back to your side  while in a flat bed without using bedrails?: A Little Help needed moving from lying on your back to sitting on the side of a flat bed without using bedrails?: A Little Help needed moving to and from a bed to a chair (including a wheelchair)?: A Lot Help needed standing up from a chair using  your arms (e.g., wheelchair or bedside chair)?: A Lot Help needed to walk in hospital room?: Total Help needed climbing 3-5 steps with a railing? : Total 6 Click Score: 12    End of Session Equipment Utilized During Treatment: Gait belt Activity Tolerance: Patient tolerated treatment well Patient left: in bed;with call bell/phone within reach;with bed alarm set;with family/visitor present Nurse Communication: Mobility status PT Visit Diagnosis: Other abnormalities of gait and mobility (R26.89);Muscle weakness (generalized) (M62.81);Difficulty in walking, not elsewhere classified (R26.2);Pain Pain - Right/Left: Left Pain - part of body: Leg    Time: 8119-1478 PT Time Calculation (min) (ACUTE ONLY): 41 min   Charges:   PT Evaluation $PT Eval Moderate Complexity: 1 Mod PT Treatments $Therapeutic Exercise: 8-22 mins PT General Charges $$ ACUTE PT VISIT: 1 Visit         Delphia Grates. Fairly IV, PT, DPT Physical Therapist- [place]  [facility]  09/21/2022, 3:55 PM

## 2022-09-21 NOTE — Progress Notes (Signed)
[facility] Progress Note  Patient: Nancy [name]    ZOX:[id number]  DOA: 09/18/2022     Date of Service: the patient was seen and examined on 09/21/2022  Chief Complaint  Patient presents with   multiple complaints   Brief hospital course: ASJHA SAMUEL is a 57 y.o. female with medical history significant of type 2 diabetes, CAD, asthma, chronic pain, diabetic foot infection, GERD, hyperlipidemia, Takotsubo cardiomyopathy presented with worsening left foot pain over the past 2 to 3 days. In the ED, Tmax 101.2, heart rate 90s, respiration stable, blood pressure 160s to 170s over 60s to 70s. Satting well on room air. White count 20, hemoglobin 10.5, platelets 460, sed rate 80, creatinine 0.97 glucose 300s. Pancultured in the ER. Lactate 2.1. Left heel imaging with posterior heel ulcer with large amount of gas with the heel pad and posterior heel consistent with heel ulcer and necrotic infection. Positive osteomyelitic changes. Podiatry and vascular surgery evaluated her.  Patient is admitted to the hospitalist service for sepsis secondary to diabetic foot infection, osteomyelitis. 09/19/22 she had left ankle amputation with plan for BKA coming Tuesday.    Assessment and Plan: Sepsis secondary to diabetic foot infection Osteomyelitis (HCC) Sepsis criteria by fever, white count, lactate. Fever, white count improved. Worsening L heel pain w/ gas gangrene secondary to posterior heel ulceration with osteomyelitis on imaging  Continue prn meds for pain control Vascular surgery performed open guillotine amputation left foot 09/19/22. Wound vac placed. Plan to take her for Left BKA on Tuesday.   Staphylococcus aureus Bacteremia Continue antibiotics Ancef, flagyl. ID consulted.  Follow final Blood cultures, sensitivities. Echo ordered.   Diabetes mellitus, type 2  Poorly controlled Blood sugar in 400s A1c 2 months ago 10.6. Semglee increased to 30units daily, pre-meal to 8 units  TID. Continue accucheks, sliding-scale insulin.   Hyperkalemia-resolved S/p One dose lokelma  Stop Lisinopril. Trend potassium. Mild kidney dysfunction noted. Avoid nephrotoxic drugs.   Takotsubo cardiomyopathy Baseline history of transient Takotsubo cardiomyopathy Appears euvolemic at present No active chest pain.   Pseudohyponatremia: Due to her high blood sugars. Continue to trend sodium, correction of blood sugars.   Acute on chronic anemia: In the setting of sepsis, iron deficiency Hb dropped to 8.6 post amputation surgery. Monitor H/H, transfuse for Hb less than 7. Will give IV iron x 3 days, oral supplementation upon dc.   Mixed hyperlipidemia Stable Continue statin   Asthma without status asthmaticus Stable from respiratory standpoint Continue home inhalers   Essential hypertension BP stable Continue Coreg 6.25 bid Hold Lisinopril for high K   Morbid obesity with BMI 40.46: Contributing to her current condition. Diet, exercise and lifestyle modification advised. Weight reduction advised.    Body mass index is 40.37 kg/m.  Nutrition Problem: Increased nutrient needs Etiology: wound healing Interventions:  Diet: Carb modified diet DVT Prophylaxis: Subcutaneous Lovenox   Advance goals of care discussion: Full code  Family Communication: family was not present at bedside, at the time of interview.  The pt provided permission to discuss medical plan with the family. Opportunity was given to ask question and all questions were answered satisfactorily.   Disposition:  Pt is from home, admitted with LLE gangrene, Avsc Sx plan for Left BKA on 9/17, which precludes a safe discharge. Discharge to [facility] vs SNF TBD after surgery.  Subjective: No significant events overnight, patient still has significant pain 5/10 in the left lower extremity, requesting that Percocet does not help.  Denied any other complaints.  Physical Exam: General: NAD, lying  comfortably Appear in no distress, affect appropriate Eyes: PERRLA ENT: Oral Mucosa Clear, moist  Neck: no JVD,  Cardiovascular: S1 and S2 Present, no Murmur,  Respiratory: good respiratory effort, Bilateral Air entry equal and Decreased, no Crackles, no wheezes Abdomen: Bowel Sound present, Soft and no tenderness,  Skin: no rashes Extremities: no Pedal edema, no calf tenderness, LLE s/p guillotine amputation, wound VAC attached Neurologic: without any new focal findings Gait not checked due to patient safety concerns  Vitals:   09/20/22 1943 09/21/22 0012 09/21/22 0100 09/21/22 0813  BP: (!) 172/62 (!) 183/52 (!) 154/62   Pulse: 70 74  73  Resp: 18 17  18   Temp: 98.2 F (36.8 C) 98.2 F (36.8 C)  97.7 F (36.5 C)  TempSrc:  Oral  Oral  SpO2: 98% 98%  94%  Weight:      Height:        Intake/Output Summary (Last 24 hours) at 09/21/2022 1428 Last data filed at 09/21/2022 1148 Gross per 24 hour  Intake 872.79 ml  Output 2000 ml  Net -1127.21 ml   Filed Weights   09/18/22 1346 09/20/22 0709  Weight: 124.3 kg 124 kg    Data Reviewed: I have personally reviewed and interpreted daily labs, tele strips, imagings as discussed above. I reviewed all nursing notes, pharmacy notes, vitals, pertinent old records I have discussed plan of care as described above with RN and patient/family.  CBC: Recent Labs  Lab 09/18/22 1348 09/19/22 0417 09/20/22 0411 09/21/22 0436  WBC 19.9* 17.6* 12.7* 11.4*  HGB 10.5* 8.9* 8.6* 8.7*  HCT 33.8* 27.7* 27.5* 26.2*  MCV 91.8 89.6 91.1 86.8  PLT 460* 404* 447* 498*   Basic Metabolic Panel: Recent Labs  Lab 09/18/22 1348 09/19/22 0417 09/19/22 2301 09/20/22 0411 09/21/22 0436  NA 130* 130*  --  131* 133*  K 5.0 4.7  --  5.6* 4.7  CL 94* 97*  --  99 102  CO2 23 24  --  21* 25  GLUCOSE 336* 303* 464* 447* 352*  BUN 28* 32*  --  45* 51*  CREATININE 0.97 0.96  --  1.10* 1.09*  CALCIUM 9.0 8.4*  --  8.2* 8.5*    Studies: No  results found.  Scheduled Meds:  vitamin C  500 mg Oral BID   carvedilol  6.25 mg Oral BID   chlorhexidine  60 mL Topical Once   enoxaparin (LOVENOX) injection  60 mg Subcutaneous Q24H   insulin aspart  0-15 Units Subcutaneous TID WC   insulin aspart  0-5 Units Subcutaneous QHS   insulin aspart  8 Units Subcutaneous TID WC   insulin glargine-yfgn  30 Units Subcutaneous Daily   multivitamin with minerals  1 tablet Oral Daily   pantoprazole  40 mg Oral Daily   Ensure Max Protein  11 oz Oral BID   Continuous Infusions:   ceFAZolin (ANCEF) IV 2 g (09/21/22 0526)   iron sucrose 200 mg (09/21/22 0836)   metronidazole 500 mg (09/21/22 0941)   PRN Meds: acetaminophen **OR** acetaminophen, ALPRAZolam, fentaNYL (SUBLIMAZE) injection, HYDROmorphone (DILAUDID) injection, HYDROmorphone, naLOXone (NARCAN)  injection, ondansetron (ZOFRAN) IV  Time spent: 35 minutes  Author: Gillis Santa. MD [facility] 09/21/2022 2:28 PM  To reach On-call, see care teams to locate the attending and reach out to them via [email or web address]. If 7PM-7AM, please contact night-coverage If you still have difficulty reaching the attending provider, please page the [facility] (Director on Call) for  [facility] on amion for assistance.

## 2022-09-22 ENCOUNTER — Inpatient Hospital Stay (HOSPITAL_COMMUNITY)
Admit: 2022-09-22 | Discharge: 2022-09-22 | Disposition: A | Discharge status: Home / Self Care | Payer: 59 | Attending: Vascular Surgery | Admitting: Vascular Surgery

## 2022-09-22 DIAGNOSIS — B9561 Methicillin susceptible Staphylococcus aureus infection as the cause of diseases classified elsewhere: Secondary | ICD-10-CM | POA: Diagnosis not present

## 2022-09-22 DIAGNOSIS — R7881 Bacteremia: Secondary | ICD-10-CM | POA: Diagnosis not present

## 2022-09-22 LAB — ECHOCARDIOGRAM COMPLETE
AR max vel: 2.5 cm2
AV Peak grad: 7.4 mmHg
Ao pk vel: 1.36 m/s
Area-P 1/2: 4.01 cm2
Height: 69 in
S' Lateral: 3.8 cm
Weight: 4373.93 [oz_av]

## 2022-09-22 LAB — BASIC METABOLIC PANEL
Anion gap: 9 (ref 5–15)
BUN: 35 mg/dL — ABNORMAL HIGH (ref 6–20)
CO2: 27 mmol/L (ref 22–32)
Calcium: 8.6 mg/dL — ABNORMAL LOW (ref 8.9–10.3)
Chloride: 101 mmol/L (ref 98–111)
Creatinine, Ser: 0.84 mg/dL (ref 0.44–1.00)
GFR, Estimated: >60 mL/min (ref >60)
Glucose, Bld: 169 mg/dL — ABNORMAL HIGH (ref 70–99)
Potassium: 4.6 mmol/L (ref 3.5–5.1)
Sodium: 137 mmol/L (ref 135–145)

## 2022-09-22 LAB — GLUCOSE, CAPILLARY
Glucose-Capillary: 161 mg/dL — ABNORMAL HIGH (ref 70–99)
Glucose-Capillary: 197 mg/dL — ABNORMAL HIGH (ref 70–99)
Glucose-Capillary: 183 mg/dL — ABNORMAL HIGH (ref 70–99)
Glucose-Capillary: 196 mg/dL — ABNORMAL HIGH (ref 70–99)

## 2022-09-22 LAB — CULTURE, BLOOD (ROUTINE X 2): Special Requests: ADEQUATE

## 2022-09-22 LAB — PHOSPHORUS: Phosphorus: 3 mg/dL (ref 2.5–4.6)

## 2022-09-22 LAB — CBC
HCT: 30 % — ABNORMAL LOW (ref 36.0–46.0)
Hemoglobin: 9.8 g/dL — ABNORMAL LOW (ref 12.0–15.0)
MCH: 28.5 pg (ref 26.0–34.0)
MCHC: 32.7 g/dL (ref 30.0–36.0)
MCV: 87.2 fL (ref 80.0–100.0)
Platelets: 622 10*3/uL — ABNORMAL HIGH (ref 150–400)
RBC: 3.44 MIL/uL — ABNORMAL LOW (ref 3.87–5.11)
RDW: 14 % (ref 11.5–15.5)
WBC: 13.8 10*3/uL — ABNORMAL HIGH (ref 4.0–10.5)
nRBC: 0.4 % — ABNORMAL HIGH (ref 0.0–0.2)

## 2022-09-22 LAB — MAGNESIUM: Magnesium: 1.8 mg/dL (ref 1.7–2.4)

## 2022-09-22 MED ORDER — BISACODYL 5 MG PO TBEC
10.0000 mg | DELAYED_RELEASE_TABLET | Freq: Every day | ORAL | Status: DC
  Administered 2022-09-22 – 2022-09-24 (×3): 10 mg via ORAL
  Filled 2022-09-22 (×7): qty 2

## 2022-09-22 MED ORDER — BISACODYL 10 MG RE SUPP: 10.0000 mg | Freq: Every day | RECTAL | Status: DC | PRN

## 2022-09-22 MED ORDER — GABAPENTIN 100 MG PO CAPS
100.0000 mg | ORAL_CAPSULE | Freq: Three times a day (TID) | ORAL | Status: DC
  Administered 2022-09-22 – 2022-09-27 (×17): 100 mg via ORAL
  Filled 2022-09-22 (×17): qty 1

## 2022-09-22 MED ORDER — POLYSACCHARIDE IRON COMPLEX 150 MG PO CAPS
150.0000 mg | ORAL_CAPSULE | Freq: Every day | ORAL | Status: DC
  Administered 2022-09-23 – 2022-09-30 (×7): 150 mg via ORAL
  Filled 2022-09-22 (×8): qty 1

## 2022-09-22 MED ORDER — PERFLUTREN LIPID MICROSPHERE
1.0000 mL | INTRAVENOUS | Status: AC | PRN
  Administered 2022-09-22: 3 mL via INTRAVENOUS

## 2022-09-22 MED ORDER — POLYETHYLENE GLYCOL 3350 17 G PO PACK
17.0000 g | PACK | Freq: Two times a day (BID) | ORAL | Status: DC
  Administered 2022-09-22 – 2022-09-29 (×4): 17 g via ORAL
  Filled 2022-09-22 (×12): qty 1

## 2022-09-22 MED ORDER — BISACODYL 5 MG PO TBEC
10.0000 mg | DELAYED_RELEASE_TABLET | Freq: Once | ORAL | Status: AC
  Administered 2022-09-22: 10 mg via ORAL
  Filled 2022-09-22: qty 2

## 2022-09-22 NOTE — Progress Notes (Signed)
[facility]  PROGRESS NOTE  ASSESSMENT / PLAN: Nancy [name] is a 57 y.o. female status post guillotine amputation of left lower extremity. Doing well overall. Plan formal below knee amputation on Tuesday 09/24/22 with Dr. Wyn Quaker   SUBJECTIVE: No complaints. IV nurse getting access. No new issues.  OBJECTIVE: BP (!) 160/79 (BP Location: Right Arm)   Pulse 78   Temp 98.1 F (36.7 C)   Resp 18   Ht 5\' 9"  (1.753 m)   Wt 124 kg   LMP 02/08/2013 (Within Months)   SpO2 100%   BMI 40.37 kg/m   Intake/Output Summary (Last 24 hours) at 09/22/2022 0932 Last data filed at 09/22/2022 0641 Gross per 24 hour  Intake 820.61 ml  Output 1130 ml  Net -309.39 ml    No complaints. Regular rate and rhythm Unlabored breathing Left amputation stump appears healthy VAC in place with good seal     Latest Ref Rng & Units 09/22/2022    5:41 AM 09/21/2022    4:36 AM 09/20/2022    4:11 AM  CBC  WBC 4.0 - 10.5 K/uL 13.8  11.4  12.7   Hemoglobin 12.0 - 15.0 g/dL 9.8  8.7  8.6   Hematocrit 36.0 - 46.0 % 30.0  26.2  27.5   Platelets 150 - 400 K/uL 622  498  447         Latest Ref Rng & Units 09/22/2022    5:41 AM 09/21/2022    4:36 AM 09/20/2022    4:11 AM  CMP  Glucose 70 - 99 mg/dL 440  102  725   BUN 6 - 20 mg/dL 35  51  45   Creatinine 0.44 - 1.00 mg/dL 3.66  4.40  3.47   Sodium 135 - 145 mmol/L 137  133  131   Potassium 3.5 - 5.1 mmol/L 4.6  4.7  5.6   Chloride 98 - 111 mmol/L 101  102  99   CO2 22 - 32 mmol/L 27  25  21    Calcium 8.9 - 10.3 mg/dL 8.6  8.5  8.2     Estimated Creatinine Clearance: 104.2 mL/min (by C-G formula based on SCr of 0.84 mg/dL).  Rande Brunt. Lenell Antu, MD [facility] Phone Number: [phone] 09/22/2022 9:32 AM

## 2022-09-22 NOTE — Progress Notes (Signed)
[facility] Progress Note  Patient: Nancy [name]    ZOX:[id number]  DOA: 09/18/2022     Date of Service: the patient was seen and examined on 09/22/2022  Chief Complaint  Patient presents with   multiple complaints   Brief hospital course: Nancy [name] is a 57 y.o. female with medical history significant of type 2 diabetes, CAD, asthma, chronic pain, diabetic foot infection, GERD, hyperlipidemia, Takotsubo cardiomyopathy presented with worsening left foot pain over the past 2 to 3 days. In the ED, Tmax 101.2, heart rate 90s, respiration stable, blood pressure 160s to 170s over 60s to 70s. Satting well on room air. White count 20, hemoglobin 10.5, platelets 460, sed rate 80, creatinine 0.97 glucose 300s. Pancultured in the ER. Lactate 2.1. Left heel imaging with posterior heel ulcer with large amount of gas with the heel pad and posterior heel consistent with heel ulcer and necrotic infection. Positive osteomyelitic changes. Podiatry and vascular surgery evaluated her.  Patient is admitted to the hospitalist service for sepsis secondary to diabetic foot infection, osteomyelitis. 09/19/22 she had left ankle amputation with plan for BKA coming Tuesday.    Assessment and Plan: Sepsis secondary to diabetic foot infection Osteomyelitis  Sepsis criteria by fever, white count, lactate. Fever, white count improved. Worsening L heel pain w/ gas gangrene secondary to posterior heel ulceration with osteomyelitis on imaging  Continue prn meds for pain control 9/15 started gabapentin 100 mg p.o. 3 times daily Vascular surgery performed open guillotine amputation left foot 09/19/22. Wound vac placed. Plan to take her for Left BKA on Tuesday.    Staphylococcus aureus Bacteremia Continue antibiotics Ancef, flagyl. ID consulted.  Follow final Blood cultures, sensitivities.  F/u TTE, still pending    Diabetes mellitus, type 2  Poorly controlled Blood sugar in 400s A1c 2 months ago  10.6. Semglee increased to 30units daily, pre-meal to 8 units TID. Continue accucheks, sliding-scale insulin.   Hyperkalemia-resolved S/p One dose lokelma  Stop Lisinopril. Trend potassium. Mild kidney dysfunction noted. Avoid nephrotoxic drugs.   Takotsubo cardiomyopathy Baseline history of transient Takotsubo cardiomyopathy Appears euvolemic at present No active chest pain.   Pseudohyponatremia: Resolved  Due to her high blood sugars. Continue to trend sodium, correction of blood sugars.   Acute on chronic anemia: In the setting of sepsis, iron deficiency Hb dropped to 8.6 post amputation surgery. Monitor H/H, transfuse for Hb less than 7. S/p IV iron x 3 days (last dose 9/15) continue oral supplementation upon dc.   Mixed hyperlipidemia Stable Continue statin   Asthma without status asthmaticus Stable from respiratory standpoint Continue home inhalers   Essential hypertension BP stable Continue Coreg 6.25 bid Hold Lisinopril for high K   Constipation, continue laxatives.  Morbid obesity with BMI 40.46: Contributing to her current condition. Diet, exercise and lifestyle modification advised. Weight reduction advised.    Body mass index is 40.37 kg/m.  Nutrition Problem: Increased nutrient needs Etiology: wound healing Interventions:  Diet: Carb modified diet DVT Prophylaxis: Subcutaneous Lovenox   Advance goals of care discussion: Full code  Family Communication: family was not present at bedside, at the time of interview.  The pt provided permission to discuss medical plan with the family. Opportunity was given to ask question and all questions were answered satisfactorily.   Disposition:  Pt is from home, admitted with LLE gangrene, Avsc Sx plan for Left BKA on 9/17, which precludes a safe discharge. Discharge to [facility] vs SNF TBD after surgery.  Subjective: No significant events  overnight, left lower rib pain is well-controlled with current  medications.  Patient denied any other complaints.   Physical Exam: General: NAD, lying comfortably Appear in no distress, affect appropriate Eyes: PERRLA ENT: Oral Mucosa Clear, moist  Neck: no JVD,  Cardiovascular: S1 and S2 Present, no Murmur,  Respiratory: good respiratory effort, Bilateral Air entry equal and Decreased, no Crackles, no wheezes Abdomen: Bowel Sound present, Soft and no tenderness,  Skin: no rashes Extremities: no Pedal edema, no calf tenderness, LLE s/p guillotine amputation, wound VAC attached Neurologic: without any new focal findings Gait not checked due to patient safety concerns  Vitals:   09/21/22 0813 09/21/22 1628 09/22/22 0048 09/22/22 0812  BP:  129/64 (!) 121/54 (!) 160/79  Pulse: 73 71 68 78  Resp: 18  18 18   Temp: 97.7 F (36.5 C) 97.8 F (36.6 C) 98.3 F (36.8 C) 98.1 F (36.7 C)  TempSrc: Oral     SpO2: 94% 97% 92% 100%  Weight:      Height:        Intake/Output Summary (Last 24 hours) at 09/22/2022 1230 Last data filed at 09/22/2022 0641 Gross per 24 hour  Intake 820.61 ml  Output 530 ml  Net 290.61 ml   Filed Weights   09/18/22 1346 09/20/22 0709  Weight: 124.3 kg 124 kg    Data Reviewed: I have personally reviewed and interpreted daily labs, tele strips, imagings as discussed above. I reviewed all nursing notes, pharmacy notes, vitals, pertinent old records I have discussed plan of care as described above with RN and patient/family.  CBC: Recent Labs  Lab 09/18/22 1348 09/19/22 0417 09/20/22 0411 09/21/22 0436 09/22/22 0541  WBC 19.9* 17.6* 12.7* 11.4* 13.8*  HGB 10.5* 8.9* 8.6* 8.7* 9.8*  HCT 33.8* 27.7* 27.5* 26.2* 30.0*  MCV 91.8 89.6 91.1 86.8 87.2  PLT 460* 404* 447* 498* 622*   Basic Metabolic Panel: Recent Labs  Lab 09/18/22 1348 09/19/22 0417 09/19/22 2301 09/20/22 0411 09/21/22 0436 09/22/22 0541  NA 130* 130*  --  131* 133* 137  K 5.0 4.7  --  5.6* 4.7 4.6  CL 94* 97*  --  99 102 101  CO2 23 24   --  21* 25 27  GLUCOSE 336* 303* 464* 447* 352* 169*  BUN 28* 32*  --  45* 51* 35*  CREATININE 0.97 0.96  --  1.10* 1.09* 0.84  CALCIUM 9.0 8.4*  --  8.2* 8.5* 8.6*  MG  --   --   --   --   --  1.8  PHOS  --   --   --   --   --  3.0    Studies: No results found.  Scheduled Meds:  vitamin C  500 mg Oral BID   bisacodyl  10 mg Oral QHS   carvedilol  6.25 mg Oral BID   chlorhexidine  60 mL Topical Once   enoxaparin (LOVENOX) injection  60 mg Subcutaneous Q24H   gabapentin  100 mg Oral TID   insulin aspart  0-15 Units Subcutaneous TID WC   insulin aspart  0-5 Units Subcutaneous QHS   insulin aspart  8 Units Subcutaneous TID WC   insulin glargine-yfgn  30 Units Subcutaneous Daily   multivitamin with minerals  1 tablet Oral Daily   pantoprazole  40 mg Oral Daily   polyethylene glycol  17 g Oral BID   Ensure Max Protein  11 oz Oral BID   Continuous Infusions:   ceFAZolin (  ANCEF) IV 2 g (09/22/22 0522)   metronidazole 500 mg (09/22/22 1022)   PRN Meds: acetaminophen **OR** acetaminophen, ALPRAZolam, bisacodyl, fentaNYL (SUBLIMAZE) injection, HYDROmorphone (DILAUDID) injection, HYDROmorphone, naLOXone (NARCAN)  injection, ondansetron (ZOFRAN) IV  Time spent: 35 minutes  Author: Gillis Santa. MD [facility] 09/22/2022 12:30 PM  To reach On-call, see care teams to locate the attending and reach out to them via [email or web address]. If 7PM-7AM, please contact night-coverage If you still have difficulty reaching the attending provider, please page the [facility] (Director on Call) for [facility] on amion for assistance.

## 2022-09-22 NOTE — Progress Notes (Signed)
  Echocardiogram 2D Echocardiogram has been performed. Definity IV ultrasound imaging agent used on this study.  Nancy [name] 09/22/2022, 12:03 PM

## 2022-09-22 NOTE — Progress Notes (Signed)
Physical Therapy Treatment Patient Details Name: Nancy [name] MRN: [id number] DOB: 1965/08/29 Today's Date: 09/22/2022   History of Present Illness Nancy [name] is a 57 y.o. female who presents with left leg gangrene. 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 PMH includes: anxiety, CAD, DMII, CCF.    PT Comments  Pt up to/from [facility] earlier with nursing staff with RW.  She feels more confident and is progressing.  She is given a bariatric RW for increased stability for future trials.  Long talk with pt and answered questions she had in regards to recovery, rehab venues and process for prosthetics.  Pt felt more educated on process after talking.  She is able to get up/down from supine on right and left side of the bed with rail but no physical assist.  Worked on lateral scoots left/right with cues for technique and to use RLE to assist.  +2 not available to assist with standing so opportunity to work on lateral scoots to drop arm recliner is taken.  She is able to lateral scoot with set up and supervision/verbal cues.  She does well with technique and again talked about different transfer strategies and ways to increase independence upon discharge.  Remained in recliner after session with needs met.   If plan is discharge home, recommend the following: Two people to help with walking and/or transfers;Two people to help with bathing/dressing/bathroom;Help with stairs or ramp for entrance;Assist for transportation;Assistance with cooking/housework   Can travel by private vehicle        Equipment Recommendations       Recommendations for Other Services       Precautions / Restrictions Precautions Precautions: Fall Restrictions Weight Bearing Restrictions: Yes LLE Weight Bearing: Non weight bearing Other Position/Activity Restrictions: wound vac on LLE     Mobility  Bed Mobility Overal bed mobility:  Needs Assistance Bed Mobility: Sit to Supine, Supine to Sit     Supine to sit: Supervision Sit to supine: Supervision        Transfers Overall transfer level: Needs assistance Equipment used: None Transfers: Bed to chair/wheelchair/BSC            Lateral/Scoot Transfers: Contact guard assist General transfer comment: progressing with RW transfers with nursing.  worked on lateral scoot trasfers and did well to Lobbyist Bed    Modified Rankin (Stroke Patients Only)       Balance Overall balance assessment: Needs assistance Sitting-balance support: Feet supported, No upper extremity supported Sitting balance-Leahy Scale: Good                                      Cognition Arousal: Alert Behavior During Therapy: WFL for tasks assessed/performed, Anxious Overall Cognitive Status: Within Functional Limits for tasks assessed                                          Exercises      General Comments        Pertinent Vitals/Pain Pain Assessment  Pain Assessment: Faces Faces Pain Scale: Hurts a little bit Pain Intervention(s): Monitored during session    Home Living                          Prior Function            PT Goals (current goals can now be found in the care plan section) Progress towards PT goals: Progressing toward goals    Frequency    Min 1X/week      PT Plan      Co-evaluation              AM-PAC PT "6 Clicks" Mobility   Outcome Measure  Help needed turning from your back to your side while in a flat bed without using bedrails?: A Little Help needed moving from lying on your back to sitting on the side of a flat bed without using bedrails?: A Little Help needed moving to and from a bed to a chair (including a wheelchair)?: A Little Help needed standing up from a chair using your  arms (e.g., wheelchair or bedside chair)?: A Lot Help needed to walk in hospital room?: Total Help needed climbing 3-5 steps with a railing? : Total 6 Click Score: 13    End of Session   Activity Tolerance: Patient tolerated treatment well Patient left: in chair;with call bell/phone within reach;with chair alarm set Nurse Communication: Mobility status PT Visit Diagnosis: Other abnormalities of gait and mobility (R26.89);Muscle weakness (generalized) (M62.81);Difficulty in walking, not elsewhere classified (R26.2);Pain Pain - Right/Left: Left Pain - part of body: Leg     Time: 1610-9604 PT Time Calculation (min) (ACUTE ONLY): 49 min  Charges:    $Therapeutic Activity: 38-52 mins PT General Charges $$ ACUTE PT VISIT: 1 Visit                   Danielle Dess, PTA 09/22/22, 1:35 PM

## 2022-09-22 NOTE — Plan of Care (Signed)
  Problem: Education: Goal: Ability to describe self-care measures that may prevent or decrease complications (Diabetes Survival Skills Education) will improve Outcome: Progressing   Problem: Coping: Goal: Ability to adjust to condition or change in health will improve Outcome: Progressing   Problem: Fluid Volume: Goal: Ability to maintain a balanced intake and output will improve Outcome: Progressing   Problem: Metabolic: Goal: Ability to maintain appropriate glucose levels will improve Outcome: Progressing   Problem: Nutritional: Goal: Maintenance of adequate nutrition will improve Outcome: Progressing   Problem: Tissue Perfusion: Goal: Adequacy of tissue perfusion will improve Outcome: Progressing   Problem: Pain Managment: Goal: General experience of comfort will improve Outcome: Progressing   Problem: Safety: Goal: Ability to remain free from injury will improve Outcome: Progressing   Problem: Elimination: Goal: Will not experience complications related to bowel motility Outcome: Not Progressing

## 2022-09-23 DIAGNOSIS — B9561 Methicillin susceptible Staphylococcus aureus infection as the cause of diseases classified elsewhere: Secondary | ICD-10-CM | POA: Diagnosis not present

## 2022-09-23 DIAGNOSIS — E11621 Type 2 diabetes mellitus with foot ulcer: Secondary | ICD-10-CM | POA: Diagnosis not present

## 2022-09-23 DIAGNOSIS — R7881 Bacteremia: Secondary | ICD-10-CM | POA: Diagnosis not present

## 2022-09-23 DIAGNOSIS — A480 Gas gangrene: Secondary | ICD-10-CM | POA: Diagnosis not present

## 2022-09-23 LAB — CBC
HCT: 29.6 % — ABNORMAL LOW (ref 36.0–46.0)
Hemoglobin: 9.6 g/dL — ABNORMAL LOW (ref 12.0–15.0)
MCH: 28.5 pg (ref 26.0–34.0)
MCHC: 32.4 g/dL (ref 30.0–36.0)
MCV: 87.8 fL (ref 80.0–100.0)
Platelets: 589 10*3/uL — ABNORMAL HIGH (ref 150–400)
RBC: 3.37 MIL/uL — ABNORMAL LOW (ref 3.87–5.11)
RDW: 14 % (ref 11.5–15.5)
WBC: 13.9 10*3/uL — ABNORMAL HIGH (ref 4.0–10.5)
nRBC: 0.4 % — ABNORMAL HIGH (ref 0.0–0.2)

## 2022-09-23 LAB — AEROBIC/ANAEROBIC CULTURE W GRAM STAIN (SURGICAL/DEEP WOUND): Gram Stain: NONE SEEN

## 2022-09-23 LAB — BASIC METABOLIC PANEL
Anion gap: 10 (ref 5–15)
BUN: 23 mg/dL — ABNORMAL HIGH (ref 6–20)
CO2: 26 mmol/L (ref 22–32)
Calcium: 8.6 mg/dL — ABNORMAL LOW (ref 8.9–10.3)
Chloride: 101 mmol/L (ref 98–111)
Creatinine, Ser: 0.73 mg/dL (ref 0.44–1.00)
GFR, Estimated: >60 mL/min (ref >60)
Glucose, Bld: 208 mg/dL — ABNORMAL HIGH (ref 70–99)
Potassium: 4.6 mmol/L (ref 3.5–5.1)
Sodium: 137 mmol/L (ref 135–145)

## 2022-09-23 LAB — GLUCOSE, CAPILLARY
Glucose-Capillary: 219 mg/dL — ABNORMAL HIGH (ref 70–99)
Glucose-Capillary: 242 mg/dL — ABNORMAL HIGH (ref 70–99)
Glucose-Capillary: 147 mg/dL — ABNORMAL HIGH (ref 70–99)
Glucose-Capillary: 167 mg/dL — ABNORMAL HIGH (ref 70–99)

## 2022-09-23 LAB — MAGNESIUM: Magnesium: 1.7 mg/dL (ref 1.7–2.4)

## 2022-09-23 LAB — TYPE AND SCREEN
ABO/RH(D): A POS
Antibody Screen: NEGATIVE

## 2022-09-23 LAB — PHOSPHORUS: Phosphorus: 3.6 mg/dL (ref 2.5–4.6)

## 2022-09-23 NOTE — Progress Notes (Signed)
Physical Therapy Treatment Patient Details Name: Nancy [name] MRN: [id number] DOB: July 09, 1965 Today's Date: 09/23/2022   History of Present Illness Nancy [name] is a 57 y.o. female who presents with left leg gangrene. 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 PMH includes: anxiety, CAD, DMII, CCF.    PT Comments  Assisted pt off BSC prior to lunch with RW and min a x 1.  Pt feels more comfortable with bariatric walker.  Returned after lunch for independent bed mobility, practiced lateral scoot transfers to drop arm recliner x 3 with set up and supervision.  Cues to use RLE and BUE to assist and overall does better with increased hip clearance today rather than a shimmy to chair.  Mini sit to stands 3 x 10 with good clearance.  BLE AROM in sitting 3 x 10.  Answered questions as asked.   If plan is discharge home, recommend the following: Two people to help with walking and/or transfers;Two people to help with bathing/dressing/bathroom;Help with stairs or ramp for entrance;Assist for transportation;Assistance with cooking/housework   Can travel by private vehicle        Equipment Recommendations       Recommendations for Other Services       Precautions / Restrictions Precautions Precautions: Fall Restrictions Weight Bearing Restrictions: Yes LLE Weight Bearing: Non weight bearing Other Position/Activity Restrictions: wound vac on LLE     Mobility  Bed Mobility Overal bed mobility: Modified Independent                  Transfers Overall transfer level: Needs assistance Equipment used: Rolling walker (2 wheels), None Transfers: Sit to/from Stand, Bed to chair/wheelchair/BSC Sit to Stand: Min assist Stand pivot transfers: Min assist        Lateral/Scoot Transfers: Contact guard assist General transfer comment: transfered with and without RW during session.  progressing well  with RW and lateral scoot transfers    Ambulation/Gait                   Stairs             Wheelchair Mobility     Tilt Bed    Modified Rankin (Stroke Patients Only)       Balance Overall balance assessment: Needs assistance Sitting-balance support: Feet supported, No upper extremity supported Sitting balance-Leahy Scale: Good       Standing balance-Leahy Scale: Poor Standing balance comment: Heavy reliance on RW                            Cognition Arousal: Alert Behavior During Therapy: WFL for tasks assessed/performed Overall Cognitive Status: Within Functional Limits for tasks assessed                                          Exercises Other Exercises Other Exercises: seated AROM BLE, mini sit to stands 3 x 10 in recliner    General Comments        Pertinent Vitals/Pain Pain Assessment Pain Assessment: Faces Faces Pain Scale: Hurts a little bit Pain Location: LLE Pain Descriptors / Indicators: Aching, Sore, Discomfort Pain Intervention(s): Limited activity within patient's tolerance, Monitored during session    Home Living  Prior Function            PT Goals (current goals can now be found in the care plan section) Progress towards PT goals: Progressing toward goals    Frequency    Min 1X/week      PT Plan      Co-evaluation              AM-PAC PT "6 Clicks" Mobility   Outcome Measure  Help needed turning from your back to your side while in a flat bed without using bedrails?: A Little Help needed moving from lying on your back to sitting on the side of a flat bed without using bedrails?: A Little Help needed moving to and from a bed to a chair (including a wheelchair)?: A Little Help needed standing up from a chair using your arms (e.g., wheelchair or bedside chair)?: A Little Help needed to walk in hospital room?: Total Help needed climbing 3-5  steps with a railing? : Total 6 Click Score: 14    End of Session Equipment Utilized During Treatment: Gait belt Activity Tolerance: Patient tolerated treatment well Patient left: in chair;with call bell/phone within reach;with chair alarm set Nurse Communication: Mobility status PT Visit Diagnosis: Other abnormalities of gait and mobility (R26.89);Muscle weakness (generalized) (M62.81);Difficulty in walking, not elsewhere classified (R26.2);Pain Pain - Right/Left: Left Pain - part of body: Leg     Time: 9562-1308 PT Time Calculation (min) (ACUTE ONLY): 40 min  Charges:    $Therapeutic Exercise: 8-22 mins $Therapeutic Activity: 23-37 mins PT General Charges $$ ACUTE PT VISIT: 1 Visit                   Danielle Dess, PTA 09/23/22, 1:50 PM

## 2022-09-23 NOTE — H&P (View-Only) (Signed)
Progress Note    09/23/2022 10:30 AM 4 Days Post-Op  Subjective:  Nancy [name] is a 57 yo female now POD #4 open guillotine amputation of the left foot.  Patient is resting comfortably in bed this morning.  She does endorse some pain to the lower extremity but not as bad as preamputation.  No other complaints overnight.  Wound VAC is in place and working well.  Vitals all remained stable.    Vitals:   09/22/22 2300 09/23/22 0747  BP: (!) 141/53 (!) 156/70  Pulse: 76 75  Resp: 20 14  Temp: 98.3 F (36.8 C) 98.2 F (36.8 C)  SpO2: 97% 95%   Physical Exam: Cardiac:  RRR, normal S1 and S2.  No rubs or gallops or murmurs. Lungs: Clear on auscultation throughout.  Normal nonlabored breathing.  No rales, rhonchi or wheezing. Incisions: Left lower extremity guillotine amputation of left foot.  Wound VAC is in place and working well. Extremities: Left lower extremity foot guillotine amputation.  Right lower extremity with palpable pulses. Abdomen: Positive bowel sounds throughout, soft, nontender and nondistended. Neurologic: Alert and oriented x 4, follows commands and answers questions appropriately.  Tearful today post procedure.  CBC    Component Value Date/Time   WBC 13.9 (H) 09/23/2022 0237   RBC 3.37 (L) 09/23/2022 0237   HGB 9.6 (L) 09/23/2022 0237   HGB 12.3 08/07/2022 1210   HCT 29.6 (L) 09/23/2022 0237   HCT 38.6 08/07/2022 1210   PLT 589 (H) 09/23/2022 0237   PLT 320 08/07/2022 1210   MCV 87.8 09/23/2022 0237   MCV 90 08/07/2022 1210   MCV 90 08/21/2012 1523   MCH 28.5 09/23/2022 0237   MCHC 32.4 09/23/2022 0237   RDW 14.0 09/23/2022 0237   RDW 11.8 08/07/2022 1210   RDW 13.4 08/21/2012 1523   LYMPHSABS 2.4 08/07/2022 1210   LYMPHSABS 2.7 06/05/2011 0550   MONOABS 0.9 02/13/2021 0323   MONOABS 0.4 06/05/2011 0550   EOSABS 0.5 (H) 08/07/2022 1210   EOSABS 0.6 06/05/2011 0550   BASOSABS 0.1 08/07/2022 1210   BASOSABS 0.1 06/05/2011 0550    BMET     Component Value Date/Time   NA 137 09/23/2022 0237   NA 132 (L) 08/07/2022 1210   NA 133 (L) 08/21/2012 1523   K 4.6 09/23/2022 0237   K 4.1 08/21/2012 1523   CL 101 09/23/2022 0237   CL 102 08/21/2012 1523   CO2 26 09/23/2022 0237   CO2 26 08/21/2012 1523   GLUCOSE 208 (H) 09/23/2022 0237   GLUCOSE 306 (H) 08/21/2012 1523   BUN 23 (H) 09/23/2022 0237   BUN 23 08/07/2022 1210   BUN 17 08/21/2012 1523   CREATININE 0.73 09/23/2022 0237   CREATININE 0.69 08/21/2012 1523   CALCIUM 8.6 (L) 09/23/2022 0237   CALCIUM 8.8 08/21/2012 1523   GFRNONAA >60 09/23/2022 0237   GFRNONAA >60 08/21/2012 1523   GFRAA 117 11/04/2016 1109   GFRAA >60 08/21/2012 1523    INR    Component Value Date/Time   INR 1.5 (H) 09/18/2022 1845     Intake/Output Summary (Last 24 hours) at 09/23/2022 1030 Last data filed at 09/23/2022 0830 Gross per 24 hour  Intake 600.07 ml  Output 875 ml  Net -274.93 ml     Assessment/Plan:  57 y.o. female is s/p open guillotine amputation of the left foot.  4 Days Post-Op   PLAN: Plan is to complete left below the knee amputation on Tuesday, 09/24/2022.  I again this morning discussed with the patient in detail the procedure, risks, benefits and complications. She verbalizes her understanding and wishes to proceed. I answered all her questions today. Patient will be made NPO after midnight for surgery tomorrow.    We will keep wound VAC in place until then to help reduce infection and swelling. Continue to work with PT/OT OOB as much as possible with assistance. Pain medications as needed.   DVT prophylaxis: Lovenox 60 mg subcu every 24 hours.   Marcie [facility] 09/23/2022 10:30 AM

## 2022-09-23 NOTE — Plan of Care (Signed)

## 2022-09-23 NOTE — Progress Notes (Signed)
Subjective: No new complaints   Antibiotics:  Anti-infectives (From admission, onward)    Start     Dose/Rate Route Frequency Ordered Stop   09/19/22 2200  ceFAZolin (ANCEF) IVPB 2g/100 mL premix        2 g 200 mL/hr over 30 Minutes Intravenous Every 8 hours 09/19/22 2100     09/19/22 1800  vancomycin (VANCOREADY) IVPB 1750 mg/350 mL  Status:  Discontinued        1,750 mg 175 mL/hr over 120 Minutes Intravenous Every 24 hours 09/18/22 1809 09/19/22 2028   09/19/22 1308  ceFAZolin (ANCEF) IVPB 2g/100 mL premix  Status:  Discontinued        2 g 200 mL/hr over 30 Minutes Intravenous 30 min pre-op 09/19/22 1308 09/19/22 1442   09/18/22 1800  metroNIDAZOLE (FLAGYL) IVPB 500 mg  Status:  Discontinued        500 mg 100 mL/hr over 60 Minutes Intravenous Every 8 hours 09/18/22 1733 09/23/22 1026   09/18/22 1715  vancomycin (VANCOREADY) IVPB 1500 mg/300 mL  Status:  Discontinued       Placed in "Followed by" Linked Group   1,500 mg 150 mL/hr over 120 Minutes Intravenous  Once 09/18/22 1545 09/19/22 2028   09/18/22 1545  ceFEPIme (MAXIPIME) 2 g in sodium chloride 0.9 % 100 mL IVPB  Status:  Discontinued        2 g 200 mL/hr over 30 Minutes Intravenous Every 8 hours 09/18/22 1544 09/19/22 2028   09/18/22 1545  vancomycin (VANCOCIN) 2,490 mg in sodium chloride 0.9 % 500 mL IVPB  Status:  Discontinued        20 mg/kg  124.3 kg 262.5 mL/hr over 120 Minutes Intravenous  Once 09/18/22 1544 09/18/22 1545   09/18/22 1545  vancomycin (VANCOCIN) IVPB 1000 mg/200 mL premix       Placed in "Followed by" Linked Group   1,000 mg 200 mL/hr over 60 Minutes Intravenous  Once 09/18/22 1545 09/18/22 1742       Medications: Scheduled Meds:  vitamin C  500 mg Oral BID   bisacodyl  10 mg Oral QHS   carvedilol  6.25 mg Oral BID   chlorhexidine  60 mL Topical Once   enoxaparin (LOVENOX) injection  60 mg Subcutaneous Q24H   gabapentin  100 mg Oral TID   insulin aspart  0-15 Units Subcutaneous  TID WC   insulin aspart  0-5 Units Subcutaneous QHS   insulin aspart  8 Units Subcutaneous TID WC   insulin glargine-yfgn  30 Units Subcutaneous Daily   iron polysaccharides  150 mg Oral Daily   multivitamin with minerals  1 tablet Oral Daily   pantoprazole  40 mg Oral Daily   polyethylene glycol  17 g Oral BID   Ensure Max Protein  11 oz Oral BID   Continuous Infusions:   ceFAZolin (ANCEF) IV 2 g (09/23/22 0521)   PRN Meds:.acetaminophen **OR** acetaminophen, ALPRAZolam, bisacodyl, fentaNYL (SUBLIMAZE) injection, HYDROmorphone (DILAUDID) injection, HYDROmorphone, naLOXone (NARCAN)  injection, ondansetron (ZOFRAN) IV    Objective: Weight change:   Intake/Output Summary (Last 24 hours) at 09/23/2022 1233 Last data filed at 09/23/2022 0920 Gross per 24 hour  Intake 960.07 ml  Output 875 ml  Net 85.07 ml   Blood pressure (!) 156/70, pulse 75, temperature 98.2 F (36.8 C), resp. rate 14, height 5\' 9"  (1.753 m), weight 125.8 kg, last menstrual period 02/08/2013, SpO2 95%. Temp:  [98.2 F (36.8 C)-99.1 F (37.3  C)] 98.2 F (36.8 C) (09/16 0747) Pulse Rate:  [75-88] 75 (09/16 0747) Resp:  [14-20] 14 (09/16 0747) BP: (124-156)/(53-70) 156/70 (09/16 0747) SpO2:  [95 %-99 %] 95 % (09/16 0747) Weight:  [125.8 kg] 125.8 kg (09/16 0500)  Physical Exam: Physical Exam Constitutional:      General: She is not in acute distress.    Appearance: She is well-developed. She is not diaphoretic.  HENT:     Head: Normocephalic and atraumatic.     Right Ear: External ear normal.     Left Ear: External ear normal.     Mouth/Throat:     Pharynx: No oropharyngeal exudate.  Eyes:     General: No scleral icterus.    Conjunctiva/sclera: Conjunctivae normal.     Pupils: Pupils are equal, round, and reactive to light.  Cardiovascular:     Rate and Rhythm: Normal rate and regular rhythm.  Pulmonary:     Effort: Pulmonary effort is normal. No respiratory distress.     Breath sounds: No wheezing.   Abdominal:     General: There is no distension.     Palpations: Abdomen is soft.  Lymphadenopathy:     Cervical: No cervical adenopathy.  Skin:    General: Skin is warm and dry.     Coloration: Skin is not pale.     Findings: No erythema or rash.  Neurological:     General: No focal deficit present.     Mental Status: She is alert and oriented to person, place, and time.     Motor: No abnormal muscle tone.     Coordination: Coordination normal.  Psychiatric:        Mood and Affect: Mood normal.        Behavior: Behavior normal.        Thought Content: Thought content normal.        Judgment: Judgment normal.     Left Lower extremity with vaccuum dressing CBC:    BMET Recent Labs    09/22/22 0541 09/23/22 0237  NA 137 137  K 4.6 4.6  CL 101 101  CO2 27 26  GLUCOSE 169* 208*  BUN 35* 23*  CREATININE 0.84 0.73  CALCIUM 8.6* 8.6*     Liver Panel  No results for input(s): "PROT", "ALBUMIN", "AST", "ALT", "ALKPHOS", "BILITOT", "BILIDIR", "IBILI" in the last 72 hours.     Sedimentation Rate No results for input(s): "ESRSEDRATE" in the last 72 hours. C-Reactive Protein No results for input(s): "CRP" in the last 72 hours.  Micro Results: Recent Results (from the past 720 hour(s))  Blood culture (routine x 2)     Status: Abnormal   Collection Time: 09/18/22  3:30 PM   Specimen: BLOOD  Result Value Ref Range Status   Specimen Description   Final    BLOOD RIGHT ANTECUBITAL Performed at [facility], [address] [place], [place] [address]    Special Requests   Final    BOTTLES DRAWN AEROBIC AND ANAEROBIC Blood Culture adequate volume Performed at [facility], [address], [place], [place] [address]    Culture  Setup Time   Final    GRAM POSITIVE COCCI IN BOTH AEROBIC AND ANAEROBIC BOTTLES CRITICAL RESULT CALLED TO, READ BACK BY AND VERIFIED WITH: ALEX CHAPPELL 09/19/22 0816 KLW Performed at [facility], [address], [place], [place] [address]    Culture STAPHYLOCOCCUS AUREUS (A)  Final   Report Status 09/22/2022 FINAL  Final  Organism ID, Bacteria STAPHYLOCOCCUS AUREUS  Final      Susceptibility   Staphylococcus aureus - MIC*    CIPROFLOXACIN <=0.5 SENSITIVE Sensitive     ERYTHROMYCIN <=0.25 SENSITIVE Sensitive     GENTAMICIN <=0.5 SENSITIVE Sensitive     OXACILLIN 0.5 SENSITIVE Sensitive     TETRACYCLINE <=1 SENSITIVE Sensitive     VANCOMYCIN <=0.5 SENSITIVE Sensitive     TRIMETH/SULFA <=10 SENSITIVE Sensitive     CLINDAMYCIN <=0.25 SENSITIVE Sensitive     RIFAMPIN <=0.5 SENSITIVE Sensitive     Inducible Clindamycin NEGATIVE Sensitive     LINEZOLID 2 SENSITIVE Sensitive     * STAPHYLOCOCCUS AUREUS  Blood Culture ID Panel (Reflexed)     Status: Abnormal   Collection Time: 09/18/22  3:30 PM  Result Value Ref Range Status   Enterococcus faecalis NOT DETECTED NOT DETECTED Final   Enterococcus Faecium NOT DETECTED NOT DETECTED Final   Listeria monocytogenes NOT DETECTED NOT DETECTED Final   Staphylococcus species DETECTED (A) NOT DETECTED Final    Comment: CRITICAL RESULT CALLED TO, READ BACK BY AND VERIFIED WITH: ALEX CHAPPELL 09/19/22 0816 KLW    Staphylococcus aureus (BCID) DETECTED (A) NOT DETECTED Final    Comment: CRITICAL RESULT CALLED TO, READ BACK BY AND VERIFIED WITH: ALEX CHAPPELL 09/19/22 0816 KLW    Staphylococcus epidermidis NOT DETECTED NOT DETECTED Final   Staphylococcus lugdunensis NOT DETECTED NOT DETECTED Final   Streptococcus species NOT DETECTED NOT DETECTED Final   Streptococcus agalactiae NOT DETECTED NOT DETECTED Final   Streptococcus pneumoniae NOT DETECTED NOT DETECTED Final   Streptococcus pyogenes NOT DETECTED NOT DETECTED Final   A.calcoaceticus-baumannii NOT DETECTED NOT DETECTED Final   Bacteroides fragilis NOT DETECTED NOT DETECTED Final   Enterobacterales NOT DETECTED NOT DETECTED Final   Enterobacter cloacae complex NOT DETECTED NOT DETECTED Final    Escherichia coli NOT DETECTED NOT DETECTED Final   Klebsiella aerogenes NOT DETECTED NOT DETECTED Final   Klebsiella oxytoca NOT DETECTED NOT DETECTED Final   Klebsiella pneumoniae NOT DETECTED NOT DETECTED Final   Proteus species NOT DETECTED NOT DETECTED Final   Salmonella species NOT DETECTED NOT DETECTED Final   Serratia marcescens NOT DETECTED NOT DETECTED Final   Haemophilus influenzae NOT DETECTED NOT DETECTED Final   Neisseria meningitidis NOT DETECTED NOT DETECTED Final   Pseudomonas aeruginosa NOT DETECTED NOT DETECTED Final   Stenotrophomonas maltophilia NOT DETECTED NOT DETECTED Final   Candida albicans NOT DETECTED NOT DETECTED Final   Candida auris NOT DETECTED NOT DETECTED Final   Candida glabrata NOT DETECTED NOT DETECTED Final   Candida krusei NOT DETECTED NOT DETECTED Final   Candida parapsilosis NOT DETECTED NOT DETECTED Final   Candida tropicalis NOT DETECTED NOT DETECTED Final   Cryptococcus neoformans/gattii NOT DETECTED NOT DETECTED Final   Meth resistant mecA/C and MREJ NOT DETECTED NOT DETECTED Final    Comment: Performed at [facility], [address] [place], [place] [address]  Aerobic/Anaerobic Culture w Gram Stain (surgical/deep wound)     Status: None (Preliminary result)   Collection Time: 09/18/22  3:50 PM   Specimen: Heel; Wound  Result Value Ref Range Status   Specimen Description   Final    HEEL Performed at [facility], [address] [place], [place] [address]    Special Requests   Final    NONE Performed at [facility], [address] [place], [place] [address]    Gram Stain   Final    NO WBC  SEEN MODERATE GRAM POSITIVE COCCI IN PAIRS Performed at [facility], [address] [place], [place] [address]    Culture   Final    MODERATE STAPHYLOCOCCUS AUREUS MODERATE CORYNEBACTERIUM STRIATUM Standardized susceptibility testing for this organism is not available. CULTURE REINCUBATED FOR BETTER  GROWTH NO ANAEROBES ISOLATED; CULTURE IN PROGRESS FOR 5 DAYS    Report Status PENDING  Incomplete   Organism ID, Bacteria STAPHYLOCOCCUS AUREUS  Final      Susceptibility   Staphylococcus aureus - MIC*    CIPROFLOXACIN <=0.5 SENSITIVE Sensitive     ERYTHROMYCIN <=0.25 SENSITIVE Sensitive     GENTAMICIN <=0.5 SENSITIVE Sensitive     OXACILLIN 0.5 SENSITIVE Sensitive     TETRACYCLINE <=1 SENSITIVE Sensitive     VANCOMYCIN <=0.5 SENSITIVE Sensitive     TRIMETH/SULFA <=10 SENSITIVE Sensitive     CLINDAMYCIN <=0.25 SENSITIVE Sensitive     RIFAMPIN <=0.5 SENSITIVE Sensitive     Inducible Clindamycin NEGATIVE Sensitive     LINEZOLID 2 SENSITIVE Sensitive     * MODERATE STAPHYLOCOCCUS AUREUS  Blood culture (routine x 2)     Status: Abnormal   Collection Time: 09/18/22  3:55 PM   Specimen: BLOOD  Result Value Ref Range Status   Specimen Description   Final    BLOOD LEFT ANTECUBITAL Performed at [facility], [address] [place], [place] [address]    Special Requests   Final    BOTTLES DRAWN AEROBIC AND ANAEROBIC Blood Culture results may not be optimal due to an excessive volume of blood received in culture bottles Performed at [facility], [address] [place], [place] [address]    Culture  Setup Time   Final    AEROBIC BOTTLE ONLY GRAM POSITIVE COCCI CRITICAL VALUE NOTED.  VALUE IS CONSISTENT WITH PREVIOUSLY REPORTED AND CALLED VALUE.    Culture (A)  Final    STAPHYLOCOCCUS AUREUS SUSCEPTIBILITIES PERFORMED ON PREVIOUS CULTURE WITHIN THE LAST 5 DAYS. Performed at [facility], [address], [place], [place] [address]    Report Status 09/22/2022 FINAL  Final  Culture, blood (Routine X 2) w Reflex to ID Panel     Status: None (Preliminary result)   Collection Time: 09/19/22  9:13 PM   Specimen: BLOOD RIGHT ARM  Result Value Ref Range Status   Specimen Description BLOOD RIGHT ARM  Final   Special Requests   Final    BOTTLES DRAWN AEROBIC AND  ANAEROBIC Blood Culture adequate volume   Culture   Final    NO GROWTH 4 DAYS Performed at [facility], [address], [place], [place] [address]    Report Status PENDING  Incomplete  Culture, blood (Routine X 2) w Reflex to ID Panel     Status: None (Preliminary result)   Collection Time: 09/19/22  9:15 PM   Specimen: BLOOD LEFT ARM  Result Value Ref Range Status   Specimen Description BLOOD LEFT ARM  Final   Special Requests   Final    BOTTLES DRAWN AEROBIC AND ANAEROBIC Blood Culture adequate volume   Culture   Final    NO GROWTH 4 DAYS Performed at [facility], [address] [place], [place] [address]    Report Status PENDING  Incomplete    Studies/Results: ECHOCARDIOGRAM COMPLETE  Result Date: 09/22/2022    ECHOCARDIOGRAM REPORT   Patient Name:   Nancy [name] Date of Exam: 09/22/2022 Medical Rec #:  [id number]  Height:       69.0 in Accession #:    [id number]          Weight:       273.4 lb Date of Birth:  1965-11-22          BSA:          2.359 m Patient Age:    56 years            BP:           121/54 mmHg Patient Gender: F                   HR:           74 bpm. Exam Location:  [facility] Procedure: 2D Echo and Intracardiac Opacification Agent Indications:     BACTEREMIA R78.81  History:         Patient has no prior history of Echocardiogram examinations.  Sonographer:     Overton Mam RDCS, FASE Referring Phys:  [id number] Latina Craver SCHNIER Diagnosing Phys: Debbe Odea MD  Sonographer Comments: Technically challenging study due to limited acoustic windows, suboptimal parasternal window, suboptimal apical window and patient is obese. Image acquisition challenging due to patient body habitus. IMPRESSIONS  1. Left ventricular ejection fraction, by estimation, is 60 to 65%. Left ventricular ejection fraction by PLAX is 56 %. The left ventricle has normal function. The left ventricle has no regional wall motion abnormalities. There is mild left  ventricular hypertrophy. Left ventricular diastolic parameters were normal.  2. Right ventricular systolic function is normal. The right ventricular size is normal.  3. The mitral valve was not well visualized. No evidence of mitral valve regurgitation.  4. The aortic valve was not well visualized. Aortic valve regurgitation is not visualized.  5. The inferior vena cava is normal in size with greater than 50% respiratory variability, suggesting right atrial pressure of 3 mmHg. FINDINGS  Left Ventricle: Left ventricular ejection fraction, by estimation, is 60 to 65%. Left ventricular ejection fraction by PLAX is 56 %. The left ventricle has normal function. The left ventricle has no regional wall motion abnormalities. Definity contrast agent was given IV to delineate the left ventricular endocardial borders. The left ventricular internal cavity size was normal in size. There is mild left ventricular hypertrophy. Left ventricular diastolic parameters were normal. Right Ventricle: The right ventricular size is normal. No increase in right ventricular wall thickness. Right ventricular systolic function is normal. Left Atrium: Left atrial size was normal in size. Right Atrium: Right atrial size was normal in size. Pericardium: There is no evidence of pericardial effusion. Mitral Valve: The mitral valve was not well visualized. No evidence of mitral valve regurgitation. Tricuspid Valve: The tricuspid valve is not well visualized. Tricuspid valve regurgitation is not demonstrated. Aortic Valve: The aortic valve was not well visualized. Aortic valve regurgitation is not visualized. Aortic valve peak gradient measures 7.4 mmHg. Pulmonic Valve: The pulmonic valve was not well visualized. Pulmonic valve regurgitation is not visualized. Aorta: The aortic root is normal in size and structure. Venous: The inferior vena cava is normal in size with greater than 50% respiratory variability, suggesting right atrial pressure of 3  mmHg. IAS/Shunts: No atrial level shunt detected by color flow Doppler.  LEFT VENTRICLE PLAX 2D LV EF:         Left            Diastology  ventricular     LV e' medial:    8.81 cm/s                ejection        LV E/e' medial:  12.3                fraction by     LV e' lateral:   10.80 cm/s                PLAX is 56      LV E/e' lateral: 10.0                %. LVIDd:         5.40 cm LVIDs:         3.80 cm LV PW:         1.10 cm LV IVS:        1.30 cm LVOT diam:     1.90 cm LV SV:         71 LV SV Index:   30 LVOT Area:     2.84 cm  LEFT ATRIUM           Index LA diam:      3.00 cm 1.27 cm/m LA Vol (A4C): 24.8 ml 10.51 ml/m  AORTIC VALVE AV Area (Vmax): 2.50 cm AV Vmax:        136.00 cm/s AV Peak Grad:   7.4 mmHg LVOT Vmax:      120.00 cm/s LVOT Vmean:     80.900 cm/s LVOT VTI:       0.252 m  AORTA Ao Root diam: 3.00 cm MITRAL VALVE MV Area (PHT): 4.01 cm     SHUNTS MV Decel Time: 189 msec     Systemic VTI:  0.25 m MV E velocity: 108.00 cm/s  Systemic Diam: 1.90 cm MV A velocity: 117.00 cm/s MV E/A ratio:  0.92 Debbe Odea MD Electronically signed by Debbe Odea MD Signature Date/Time: 09/22/2022/4:00:37 PM    Final       Assessment/Plan:  INTERVAL HISTORY: Patient scheduled for surgery tomorrow for below the knee amputation     Principal Problem:   MSSA bacteremia Active Problems:   Essential hypertension   Asthma without status asthmaticus   Mixed hyperlipidemia   Takotsubo cardiomyopathy   Diabetes mellitus, type 2 (HCC)   Sepsis secondary to diabetic foot infection   Diabetic ulcer of left heel associated with type 2 diabetes mellitus (HCC)   Diabetic foot infection (HCC)   Osteomyelitis (HCC)   Cellulitis of left lower extremity   Gas gangrene (HCC)    Nancy [name] is a 57 y.o. female with with history of diabetes mellitus prior osteomyelitis status postsurgery and courses of antibiotics now admitted with necrotic gangrenous infection left lower  extremity with likely calcaneal osteomyelitis and with MSSA bacteremia and sepsis status post guillotine amputation.  Her repeat blood cultures have cleared she is scheduled for below the knee amputation.  2D echocardiogram failed to show evidence of endocarditis.  #17M SSA bacteremia due to necrotic gangrenous infection involving left lower extremity:  We have source control but we need to exclude infectious endocarditis  Wondering if she could have a transesophageal echocardiogram during her amputation tomorrow.  Otherwise would recommend obtaining TEE later when the in the week when able.  I have personally spent involved in face-to-face and non-face-to-face activities for this patient on the day of the visit. Professional time spent includes the following activities: Preparing  to see the patient (review of tests), Obtaining and/or reviewing separately obtained history (admission/discharge record), Performing a medically appropriate examination and/or evaluation , Ordering medications/tests/procedures, referring and communicating with other health care professionals, Documenting clinical information in the EMR, Independently interpreting results (not separately reported), Communicating results to the patient/family/caregiver, Counseling and educating the patient/family/caregiver and Care coordination (not separately reported).   Dr. Rivka Safer back tomorrow.   LOS: 5 days   Acey Lav 09/23/2022, 12:33 PM

## 2022-09-23 NOTE — TOC Progression Note (Signed)
Transition of Care [facility]) - Progression Note    Patient Details  Name: Nancy [name] MRN: [id number] Date of Birth: 1965-06-07  Transition of [facility]) CM/SW Contact  Marlowe Sax, RN Phone Number: 09/23/2022, 1:57 PM  Clinical Narrative:     Met with the patient and answered her questions, I explained the process, she will have surgery tomorrow then work with therapy, I will do a search with her Ins to determine if they cover TSR and how much she will have out of pocket, she has a handicapped assessable home and All the Dme needed including an electric wheelchair, I explained worse case she will go home with [facility] PT but there may be a copay for that too, I did the ins inquire and will let her know once I hear  Expected Discharge Plan: Home w Home Health Services Barriers to Discharge: Continued Medical Work up  Expected Discharge Plan and Services   Discharge Planning Services: CM Consult   Living arrangements for the past 2 months: Single Family Home                 DME Arranged: N/A DME Agency: NA                   Social Determinants of Health (SDOH) Interventions SDOH Screenings   Depression (PHQ2-9): Low Risk  (05/31/2021)  Tobacco Use: Medium Risk (09/18/2022)    Readmission Risk Interventions     No data to display

## 2022-09-23 NOTE — Plan of Care (Signed)
Problem: Education: Goal: Knowledge of General Education information will improve Description: Including pain rating scale, medication(s)/side effects and non-pharmacologic comfort measures Outcome: Progressing   Problem: Health Behavior/Discharge Planning: Goal: Ability to manage health-related needs will improve Outcome: Progressing   Problem: Clinical Measurements: Goal: Cardiovascular complication will be avoided Outcome: Progressing   Problem: Nutrition: Goal: Adequate nutrition will be maintained Outcome: Progressing   Problem: Elimination: Goal: Will not experience complications related to bowel motility Outcome: Progressing Goal: Will not experience complications related to urinary retention Outcome: Progressing   Problem: Pain Managment: Goal: General experience of comfort will improve Outcome: Progressing

## 2022-09-23 NOTE — Progress Notes (Signed)
Inpatient Rehab Admissions Coordinator:    CIR following, will follow up once surgery is complete.   Megan Salon, MS, CCC-SLP Rehab Admissions Coordinator  [phone] (celll) [phone] (office)

## 2022-09-23 NOTE — Inpatient Diabetes Management (Signed)
Inpatient Diabetes Program Recommendations  AACE/ADA: New Consensus Statement on Inpatient Glycemic Control (2015)  Target Ranges:  Prepandial:   less than 140 mg/dL      Peak postprandial:   less than 180 mg/dL (1-2 hours)      Critically ill patients:  140 - 180 mg/dL   Lab Results  Component Value Date   GLUCAP 242 (H) 09/23/2022   HGBA1C 10.6 (H) 08/07/2022    Review of Glycemic Control  Latest Reference Range & Units 09/23/22 07:48 09/23/22 11:27  Glucose-Capillary 70 - 99 mg/dL 119 (H) 147 (H)  (H): Data is abnormally high  Diabetes history: DM2 Outpatient Diabetes medications: 70/30 50 units BID, Metformin 1000 mg QAM, Metformin 1500 mg QPM, Trulicity 4.5 mg Qweek (not taking) Current orders for Inpatient glycemic control: Semglee 30 units daily, Novolog 0-15 units TID with meals, Novolog 0-5 units at bedtime, Novolog 8 units TID with meals    Inpatient Diabetes Program Recommendations:    Semglee 35 units daily Novolog 10 units TID with meals if she eats at least 50%  Will continue to follow while inpatient.  Thank you, Dulce Sellar, MSN, CDCES Diabetes Coordinator Inpatient Diabetes Program [phone] (team pager from 8a-5p)

## 2022-09-23 NOTE — TOC Benefit Eligibility Note (Signed)
Transition of Care [facility]) Benefit Eligibility Note    Patient Details  Name: Nancy [name] MRN: [id number] Date of Birth: [birth year]  Plan Information: Number called: [phone] Representative: Thayer Ohm Reference Number: [id number]  Policy: [employer] [facility] Open Access Effective Date: 01/07/2022 Deductible: $7,500, met $6,055.12 as of time of call Out of Pocket: $9,400, met $8,050.23 as of time of call  In-Network SNF Benefit: After $7,500 deductible met, responsible for 50% co-insurance until out of pocket max met.  Limited to 90 days per calendar year, all days available at time of call.   Home Health Benefit: Responsible for $50 copay per visit until out of pocket max met.  No deductible or co-insurance applied.       [name] Phone Number: [phone] 09/23/2022, 2:35 PM

## 2022-09-23 NOTE — TOC Progression Note (Signed)
Transition of Care [facility]) - Progression Note    Patient Details  Name: Nancy [name] MRN: [id number] Date of Birth: 1965/04/11  Transition of Care [facility]) CM/SW Contact  Marlowe Sax, RN Phone Number: 09/23/2022, 9:18 AM  Clinical Narrative:    The patient on  09/19/22 had a  left ankle amputation with plan for BKA coming Tuesday. (Tomorrow) TOC to continue to follow for Assistance with DC planning and needs   Expected Discharge Plan: Home w Home Health Services Barriers to Discharge: Continued Medical Work up  Expected Discharge Plan and Services   Discharge Planning Services: CM Consult   Living arrangements for the past 2 months: Single Family Home                 DME Arranged: N/A DME Agency: NA                   Social Determinants of Health (SDOH) Interventions SDOH Screenings   Depression (PHQ2-9): Low Risk  (05/31/2021)  Tobacco Use: Medium Risk (09/18/2022)    Readmission Risk Interventions     No data to display

## 2022-09-23 NOTE — Progress Notes (Signed)
[facility] Progress Note  Patient: Nancy [name]    KGM:[id number]  DOA: 09/18/2022     Date of Service: the patient was seen and examined on 09/23/2022  Chief Complaint  Patient presents with   multiple complaints   Brief hospital course: Nancy [name] is a 57 y.o. female with medical history significant of type 2 diabetes, CAD, asthma, chronic pain, diabetic foot infection, GERD, hyperlipidemia, Takotsubo cardiomyopathy presented with worsening left foot pain over the past 2 to 3 days. In the ED, Tmax 101.2, heart rate 90s, respiration stable, blood pressure 160s to 170s over 60s to 70s. Satting well on room air. White count 20, hemoglobin 10.5, platelets 460, sed rate 80, creatinine 0.97 glucose 300s. Pancultured in the ER. Lactate 2.1. Left heel imaging with posterior heel ulcer with large amount of gas with the heel pad and posterior heel consistent with heel ulcer and necrotic infection. Positive osteomyelitic changes. Podiatry and vascular surgery evaluated her.  Patient is admitted to the hospitalist service for sepsis secondary to diabetic foot infection, osteomyelitis. 09/19/22 she had left ankle amputation with plan for BKA coming Tuesday.    Assessment and Plan: Sepsis secondary to diabetic foot infection Osteomyelitis  Sepsis criteria by fever, white count, lactate. Fever, white count improved. Worsening L heel pain w/ gas gangrene secondary to posterior heel ulceration with osteomyelitis on imaging  Continue prn meds for pain control 9/15 started gabapentin 100 mg p.o. 3 times daily Vascular surgery performed open guillotine amputation left foot 09/19/22. Wound vac placed. Plan to take her for Left BKA on Tuesday 9/17, kept n.p.o. after midnight    Staphylococcus aureus Bacteremia Continue antibiotics Ancef, flagyl. ID consulted.  Follow final Blood cultures, sensitivities.  TTE LVEF 6065%, no wall motion abnormality.  No significant valvular abnormality, no  vegetations, negative PFO ID requested transesophageal echocardiogram, cardiology was consulted, recommended it can be done on Wednesday 9/18 after recovery from surgery.    Diabetes mellitus, type 2  Poorly controlled Blood sugar in 400s A1c 2 months ago 10.6. Semglee increased to 30units daily, pre-meal to 8 units TID. Continue accucheks, sliding-scale insulin.   Hyperkalemia-resolved S/p One dose lokelma  Stop Lisinopril. Trend potassium. Mild kidney dysfunction noted. Avoid nephrotoxic drugs.   Takotsubo cardiomyopathy Baseline history of transient Takotsubo cardiomyopathy Appears euvolemic at present No active chest pain.   Pseudohyponatremia: Resolved  Due to her high blood sugars. Continue to trend sodium, correction of blood sugars.   Acute on chronic anemia: In the setting of sepsis, iron deficiency Hb dropped to 8.6 post amputation surgery. Monitor H/H, transfuse for Hb less than 7. S/p IV iron x 3 days (last dose 9/15) continue oral supplementation upon dc.   Mixed hyperlipidemia Stable Continue statin   Asthma without status asthmaticus Stable from respiratory standpoint Continue home inhalers   Essential hypertension BP stable Continue Coreg 6.25 bid Hold Lisinopril for high K   Constipation, continue laxatives.  Morbid obesity with BMI 40.46: Contributing to her current condition. Diet, exercise and lifestyle modification advised. Weight reduction advised.    Body mass index is 40.96 kg/m.  Nutrition Problem: Increased nutrient needs Etiology: wound healing Interventions:  Diet: Carb modified diet DVT Prophylaxis: Subcutaneous Lovenox   Advance goals of care discussion: Full code  Family Communication: family was not present at bedside, at the time of interview.  The pt provided permission to discuss medical plan with the family. Opportunity was given to ask question and all questions were answered satisfactorily.  Disposition:  Pt  is from home, admitted with LLE gangrene, Avsc Sx plan for Left BKA on 9/17, which precludes a safe discharge. Discharge to [facility] vs SNF TBD after surgery.  Subjective: No significant events overnight, pain is well-controlled now, 3/10, gabapentin is helping with throbbing neuropathic pain.  Constipation resolved, patient did move bowels.  Patient had an episode of vomiting in the morning, feels nauseous.  No any other complaints.  Physical Exam: General: NAD, lying comfortably Appear in no distress, affect appropriate Eyes: PERRLA ENT: Oral Mucosa Clear, moist  Neck: no JVD,  Cardiovascular: S1 and S2 Present, no Murmur,  Respiratory: good respiratory effort, Bilateral Air entry equal and Decreased, no Crackles, no wheezes Abdomen: Bowel Sound present, Soft and no tenderness,  Skin: no rashes Extremities: no Pedal edema, no calf tenderness, LLE s/p guillotine amputation, wound VAC attached Neurologic: without any new focal findings Gait not checked due to patient safety concerns  Vitals:   09/22/22 2300 09/23/22 0500 09/23/22 0747 09/23/22 1504  BP: (!) 141/53  (!) 156/70 126/61  Pulse: 76  75 80  Resp: 20  14 14   Temp: 98.3 F (36.8 C)  98.2 F (36.8 C) 98.4 F (36.9 C)  TempSrc:      SpO2: 97%  95% 98%  Weight:  125.8 kg    Height:        Intake/Output Summary (Last 24 hours) at 09/23/2022 1514 Last data filed at 09/23/2022 1330 Gross per 24 hour  Intake 1160.07 ml  Output 875 ml  Net 285.07 ml   Filed Weights   09/18/22 1346 09/20/22 0709 09/23/22 0500  Weight: 124.3 kg 124 kg 125.8 kg    Data Reviewed: I have personally reviewed and interpreted daily labs, tele strips, imagings as discussed above. I reviewed all nursing notes, pharmacy notes, vitals, pertinent old records I have discussed plan of care as described above with RN and patient/family.  CBC: Recent Labs  Lab 09/19/22 0417 09/20/22 0411 09/21/22 0436 09/22/22 0541 09/23/22 0237  WBC 17.6* 12.7*  11.4* 13.8* 13.9*  HGB 8.9* 8.6* 8.7* 9.8* 9.6*  HCT 27.7* 27.5* 26.2* 30.0* 29.6*  MCV 89.6 91.1 86.8 87.2 87.8  PLT 404* 447* 498* 622* 589*   Basic Metabolic Panel: Recent Labs  Lab 09/19/22 0417 09/19/22 2301 09/20/22 0411 09/21/22 0436 09/22/22 0541 09/23/22 0237  NA 130*  --  131* 133* 137 137  K 4.7  --  5.6* 4.7 4.6 4.6  CL 97*  --  99 102 101 101  CO2 24  --  21* 25 27 26   GLUCOSE 303* 464* 447* 352* 169* 208*  BUN 32*  --  45* 51* 35* 23*  CREATININE 0.96  --  1.10* 1.09* 0.84 0.73  CALCIUM 8.4*  --  8.2* 8.5* 8.6* 8.6*  MG  --   --   --   --  1.8 1.7  PHOS  --   --   --   --  3.0 3.6    Studies: No results found.  Scheduled Meds:  vitamin C  500 mg Oral BID   bisacodyl  10 mg Oral QHS   carvedilol  6.25 mg Oral BID   chlorhexidine  60 mL Topical Once   enoxaparin (LOVENOX) injection  60 mg Subcutaneous Q24H   gabapentin  100 mg Oral TID   insulin aspart  0-15 Units Subcutaneous TID WC   insulin aspart  0-5 Units Subcutaneous QHS   insulin aspart  8 Units Subcutaneous TID  WC   insulin glargine-yfgn  30 Units Subcutaneous Daily   iron polysaccharides  150 mg Oral Daily   multivitamin with minerals  1 tablet Oral Daily   pantoprazole  40 mg Oral Daily   polyethylene glycol  17 g Oral BID   Ensure Max Protein  11 oz Oral BID   Continuous Infusions:   ceFAZolin (ANCEF) IV 2 g (09/23/22 1350)   PRN Meds: acetaminophen **OR** acetaminophen, ALPRAZolam, bisacodyl, fentaNYL (SUBLIMAZE) injection, HYDROmorphone (DILAUDID) injection, HYDROmorphone, naLOXone (NARCAN)  injection, ondansetron (ZOFRAN) IV  Time spent: 35 minutes  Author: Gillis Santa. MD [facility] 09/23/2022 3:14 PM  To reach On-call, see care teams to locate the attending and reach out to them via [email or web address]. If 7PM-7AM, please contact night-coverage If you still have difficulty reaching the attending provider, please page the [facility] (Director on Call) for [facility] on amion  for assistance.

## 2022-09-23 NOTE — Progress Notes (Signed)
Progress Note    09/23/2022 10:30 AM 4 Days Post-Op  Subjective:  Nancy [name] is a 57 yo female now POD #4 open guillotine amputation of the left foot.  Patient is resting comfortably in bed this morning.  She does endorse some pain to the lower extremity but not as bad as preamputation.  No other complaints overnight.  Wound VAC is in place and working well.  Vitals all remained stable.    Vitals:   09/22/22 2300 09/23/22 0747  BP: (!) 141/53 (!) 156/70  Pulse: 76 75  Resp: 20 14  Temp: 98.3 F (36.8 C) 98.2 F (36.8 C)  SpO2: 97% 95%   Physical Exam: Cardiac:  RRR, normal S1 and S2.  No rubs or gallops or murmurs. Lungs: Clear on auscultation throughout.  Normal nonlabored breathing.  No rales, rhonchi or wheezing. Incisions: Left lower extremity guillotine amputation of left foot.  Wound VAC is in place and working well. Extremities: Left lower extremity foot guillotine amputation.  Right lower extremity with palpable pulses. Abdomen: Positive bowel sounds throughout, soft, nontender and nondistended. Neurologic: Alert and oriented x 4, follows commands and answers questions appropriately.  Tearful today post procedure.  CBC    Component Value Date/Time   WBC 13.9 (H) 09/23/2022 0237   RBC 3.37 (L) 09/23/2022 0237   HGB 9.6 (L) 09/23/2022 0237   HGB 12.3 08/07/2022 1210   HCT 29.6 (L) 09/23/2022 0237   HCT 38.6 08/07/2022 1210   PLT 589 (H) 09/23/2022 0237   PLT 320 08/07/2022 1210   MCV 87.8 09/23/2022 0237   MCV 90 08/07/2022 1210   MCV 90 08/21/2012 1523   MCH 28.5 09/23/2022 0237   MCHC 32.4 09/23/2022 0237   RDW 14.0 09/23/2022 0237   RDW 11.8 08/07/2022 1210   RDW 13.4 08/21/2012 1523   LYMPHSABS 2.4 08/07/2022 1210   LYMPHSABS 2.7 06/05/2011 0550   MONOABS 0.9 02/13/2021 0323   MONOABS 0.4 06/05/2011 0550   EOSABS 0.5 (H) 08/07/2022 1210   EOSABS 0.6 06/05/2011 0550   BASOSABS 0.1 08/07/2022 1210   BASOSABS 0.1 06/05/2011 0550    BMET     Component Value Date/Time   NA 137 09/23/2022 0237   NA 132 (L) 08/07/2022 1210   NA 133 (L) 08/21/2012 1523   K 4.6 09/23/2022 0237   K 4.1 08/21/2012 1523   CL 101 09/23/2022 0237   CL 102 08/21/2012 1523   CO2 26 09/23/2022 0237   CO2 26 08/21/2012 1523   GLUCOSE 208 (H) 09/23/2022 0237   GLUCOSE 306 (H) 08/21/2012 1523   BUN 23 (H) 09/23/2022 0237   BUN 23 08/07/2022 1210   BUN 17 08/21/2012 1523   CREATININE 0.73 09/23/2022 0237   CREATININE 0.69 08/21/2012 1523   CALCIUM 8.6 (L) 09/23/2022 0237   CALCIUM 8.8 08/21/2012 1523   GFRNONAA >60 09/23/2022 0237   GFRNONAA >60 08/21/2012 1523   GFRAA 117 11/04/2016 1109   GFRAA >60 08/21/2012 1523    INR    Component Value Date/Time   INR 1.5 (H) 09/18/2022 1845     Intake/Output Summary (Last 24 hours) at 09/23/2022 1030 Last data filed at 09/23/2022 0830 Gross per 24 hour  Intake 600.07 ml  Output 875 ml  Net -274.93 ml     Assessment/Plan:  57 y.o. female is s/p open guillotine amputation of the left foot.  4 Days Post-Op   PLAN: Plan is to complete left below the knee amputation on Tuesday, 09/24/2022.  I again this morning discussed with the patient in detail the procedure, risks, benefits and complications. She verbalizes her understanding and wishes to proceed. I answered all her questions today. Patient will be made NPO after midnight for surgery tomorrow.    We will keep wound VAC in place until then to help reduce infection and swelling. Continue to work with PT/OT OOB as much as possible with assistance. Pain medications as needed.   DVT prophylaxis: Lovenox 60 mg subcu every 24 hours.   Marcie Bal [facility] 09/23/2022 10:30 AM

## 2022-09-24 ENCOUNTER — Encounter: Payer: Self-pay | Admitting: Family Medicine

## 2022-09-24 ENCOUNTER — Inpatient Hospital Stay: Payer: 59 | Admitting: Anesthesiology

## 2022-09-24 ENCOUNTER — Other Ambulatory Visit: Payer: Self-pay

## 2022-09-24 ENCOUNTER — Encounter
Admission: EM | Disposition: A | Discharge status: Home Health | Payer: Self-pay | Source: Home / Self Care | Attending: Internal Medicine

## 2022-09-24 DIAGNOSIS — A480 Gas gangrene: Secondary | ICD-10-CM | POA: Diagnosis not present

## 2022-09-24 DIAGNOSIS — L03116 Cellulitis of left lower limb: Secondary | ICD-10-CM | POA: Diagnosis not present

## 2022-09-24 DIAGNOSIS — A419 Sepsis, unspecified organism: Secondary | ICD-10-CM | POA: Diagnosis not present

## 2022-09-24 DIAGNOSIS — Z9889 Other specified postprocedural states: Secondary | ICD-10-CM | POA: Diagnosis not present

## 2022-09-24 DIAGNOSIS — E1100 Type 2 diabetes mellitus with hyperosmolarity without nonketotic hyperglycemic-hyperosmolar coma (NKHHC): Secondary | ICD-10-CM | POA: Diagnosis not present

## 2022-09-24 DIAGNOSIS — Z89512 Acquired absence of left leg below knee: Secondary | ICD-10-CM

## 2022-09-24 DIAGNOSIS — E11621 Type 2 diabetes mellitus with foot ulcer: Secondary | ICD-10-CM | POA: Diagnosis not present

## 2022-09-24 DIAGNOSIS — I96 Gangrene, not elsewhere classified: Secondary | ICD-10-CM | POA: Diagnosis not present

## 2022-09-24 DIAGNOSIS — B9561 Methicillin susceptible Staphylococcus aureus infection as the cause of diseases classified elsewhere: Secondary | ICD-10-CM | POA: Diagnosis not present

## 2022-09-24 DIAGNOSIS — L97429 Non-pressure chronic ulcer of left heel and midfoot with unspecified severity: Secondary | ICD-10-CM | POA: Diagnosis not present

## 2022-09-24 HISTORY — PX: AMPUTATION: SHX166

## 2022-09-24 LAB — CULTURE, BLOOD (ROUTINE X 2)
Culture: NO GROWTH
Culture: NO GROWTH
Special Requests: ADEQUATE
Special Requests: ADEQUATE

## 2022-09-24 LAB — CBC
HCT: 28 % — ABNORMAL LOW (ref 36.0–46.0)
Hemoglobin: 9 g/dL — ABNORMAL LOW (ref 12.0–15.0)
MCH: 28.5 pg (ref 26.0–34.0)
MCHC: 32.1 g/dL (ref 30.0–36.0)
MCV: 88.6 fL (ref 80.0–100.0)
Platelets: 557 10*3/uL — ABNORMAL HIGH (ref 150–400)
RBC: 3.16 MIL/uL — ABNORMAL LOW (ref 3.87–5.11)
RDW: 14 % (ref 11.5–15.5)
WBC: 13.6 10*3/uL — ABNORMAL HIGH (ref 4.0–10.5)
nRBC: 0.1 % (ref 0.0–0.2)

## 2022-09-24 LAB — BASIC METABOLIC PANEL
Anion gap: 6 (ref 5–15)
BUN: 20 mg/dL (ref 6–20)
CO2: 28 mmol/L (ref 22–32)
Calcium: 8.3 mg/dL — ABNORMAL LOW (ref 8.9–10.3)
Chloride: 102 mmol/L (ref 98–111)
Creatinine, Ser: 0.7 mg/dL (ref 0.44–1.00)
GFR, Estimated: >60 mL/min (ref >60)
Glucose, Bld: 198 mg/dL — ABNORMAL HIGH (ref 70–99)
Potassium: 4.5 mmol/L (ref 3.5–5.1)
Sodium: 136 mmol/L (ref 135–145)

## 2022-09-24 LAB — PHOSPHORUS: Phosphorus: 3.9 mg/dL (ref 2.5–4.6)

## 2022-09-24 LAB — GLUCOSE, CAPILLARY
Glucose-Capillary: 205 mg/dL — ABNORMAL HIGH (ref 70–99)
Glucose-Capillary: 185 mg/dL — ABNORMAL HIGH (ref 70–99)
Glucose-Capillary: 215 mg/dL — ABNORMAL HIGH (ref 70–99)
Glucose-Capillary: 309 mg/dL — ABNORMAL HIGH (ref 70–99)
Glucose-Capillary: 251 mg/dL — ABNORMAL HIGH (ref 70–99)

## 2022-09-24 LAB — MAGNESIUM: Magnesium: 1.8 mg/dL (ref 1.7–2.4)

## 2022-09-24 SURGERY — AMPUTATION BELOW KNEE
Anesthesia: General | Site: Knee | Laterality: Left

## 2022-09-24 MED ORDER — BUPIVACAINE LIPOSOME 1.3 % IJ SUSP
INTRAMUSCULAR | Status: DC | PRN
  Administered 2022-09-24: 10 mL

## 2022-09-24 MED ORDER — FENTANYL CITRATE (PF) 100 MCG/2ML IJ SOLN
INTRAMUSCULAR | Status: AC
  Filled 2022-09-24: qty 2

## 2022-09-24 MED ORDER — MIDAZOLAM HCL 2 MG/2ML IJ SOLN
INTRAMUSCULAR | Status: AC
  Filled 2022-09-24: qty 2

## 2022-09-24 MED ORDER — PHENYLEPHRINE HCL (PRESSORS) 10 MG/ML IV SOLN
INTRAVENOUS | Status: DC | PRN
  Administered 2022-09-24 (×3): 80 ug via INTRAVENOUS
  Administered 2022-09-24: 160 ug via INTRAVENOUS
  Administered 2022-09-24 (×2): 80 ug via INTRAVENOUS
  Administered 2022-09-24: 160 ug via INTRAVENOUS
  Administered 2022-09-24: 80 ug via INTRAVENOUS

## 2022-09-24 MED ORDER — ONDANSETRON HCL 4 MG/2ML IJ SOLN
INTRAMUSCULAR | Status: DC | PRN
  Administered 2022-09-24: 4 mg via INTRAVENOUS

## 2022-09-24 MED ORDER — SUGAMMADEX SODIUM 200 MG/2ML IV SOLN
INTRAVENOUS | Status: DC | PRN
  Administered 2022-09-24: 250 mg via INTRAVENOUS

## 2022-09-24 MED ORDER — CEFAZOLIN SODIUM-DEXTROSE 1-4 GM/50ML-% IV SOLN
INTRAVENOUS | Status: DC | PRN
Start: 2022-09-24 — End: 2022-09-24
  Administered 2022-09-24: 1 g via INTRAVENOUS

## 2022-09-24 MED ORDER — PROPOFOL 10 MG/ML IV BOLUS
INTRAVENOUS | Status: AC
  Filled 2022-09-24: qty 20

## 2022-09-24 MED ORDER — DEXAMETHASONE SODIUM PHOSPHATE 10 MG/ML IJ SOLN
INTRAMUSCULAR | Status: DC | PRN
  Administered 2022-09-24: 5 mg via INTRAVENOUS

## 2022-09-24 MED ORDER — SODIUM CHLORIDE 0.9 % IV SOLN
INTRAVENOUS | Status: DC | PRN
Start: 2022-09-24 — End: 2022-09-24

## 2022-09-24 MED ORDER — ONDANSETRON HCL 4 MG/2ML IJ SOLN
INTRAMUSCULAR | Status: AC
  Filled 2022-09-24: qty 2

## 2022-09-24 MED ORDER — CEFAZOLIN SODIUM-DEXTROSE 2-4 GM/100ML-% IV SOLN: 2.0000 g | Freq: Three times a day (TID) | INTRAVENOUS | Status: DC

## 2022-09-24 MED ORDER — FENTANYL CITRATE (PF) 100 MCG/2ML IJ SOLN
INTRAMUSCULAR | Status: DC | PRN
  Administered 2022-09-24: 50 ug via INTRAVENOUS
  Administered 2022-09-24 (×2): 25 ug via INTRAVENOUS

## 2022-09-24 MED ORDER — PROPOFOL 10 MG/ML IV BOLUS
INTRAVENOUS | Status: DC | PRN
  Administered 2022-09-24: 150 mg via INTRAVENOUS

## 2022-09-24 MED ORDER — LIDOCAINE HCL (PF) 2 % IJ SOLN
INTRAMUSCULAR | Status: AC
  Filled 2022-09-24: qty 5

## 2022-09-24 MED ORDER — ROCURONIUM BROMIDE 10 MG/ML (PF) SYRINGE
PREFILLED_SYRINGE | INTRAVENOUS | Status: AC
  Filled 2022-09-24: qty 10

## 2022-09-24 MED ORDER — LIDOCAINE HCL (CARDIAC) PF 100 MG/5ML IV SOSY
PREFILLED_SYRINGE | INTRAVENOUS | Status: DC | PRN
  Administered 2022-09-24: 100 mg via INTRAVENOUS

## 2022-09-24 MED ORDER — ONDANSETRON HCL 4 MG/2ML IJ SOLN: 4.0000 mg | Freq: Once | INTRAMUSCULAR | Status: DC | PRN

## 2022-09-24 MED ORDER — ROCURONIUM BROMIDE 100 MG/10ML IV SOLN
INTRAVENOUS | Status: DC | PRN
  Administered 2022-09-24: 50 mg via INTRAVENOUS

## 2022-09-24 MED ORDER — PHENYLEPHRINE 80 MCG/ML (10ML) SYRINGE FOR IV PUSH (FOR BLOOD PRESSURE SUPPORT)
PREFILLED_SYRINGE | INTRAVENOUS | Status: AC
  Filled 2022-09-24: qty 10

## 2022-09-24 MED ORDER — EPHEDRINE SULFATE-NACL 50-0.9 MG/10ML-% IV SOSY
PREFILLED_SYRINGE | INTRAVENOUS | Status: DC | PRN
Start: 2022-09-24 — End: 2022-09-24
  Administered 2022-09-24 (×2): 5 mg via INTRAVENOUS

## 2022-09-24 MED ORDER — LISINOPRIL 5 MG PO TABS
5.0000 mg | ORAL_TABLET | Freq: Every day | ORAL | Status: DC
  Administered 2022-09-25: 5 mg via ORAL
  Filled 2022-09-24: qty 1

## 2022-09-24 MED ORDER — FENTANYL CITRATE (PF) 100 MCG/2ML IJ SOLN
25.0000 ug | INTRAMUSCULAR | Status: DC | PRN
  Administered 2022-09-24: 50 ug via INTRAVENOUS
  Administered 2022-09-24 (×2): 25 ug via INTRAVENOUS

## 2022-09-24 MED ORDER — PROPOFOL 500 MG/50ML IV EMUL
INTRAVENOUS | Status: DC | PRN
Start: 2022-09-24 — End: 2022-09-24
  Administered 2022-09-24: 150 ug/kg/min via INTRAVENOUS

## 2022-09-24 MED ORDER — PHENYLEPHRINE 80 MCG/ML (10ML) SYRINGE FOR IV PUSH (FOR BLOOD PRESSURE SUPPORT)
PREFILLED_SYRINGE | INTRAVENOUS | Status: DC | PRN
Start: 2022-09-24 — End: 2022-09-24

## 2022-09-24 MED ORDER — 0.9 % SODIUM CHLORIDE (POUR BTL) OPTIME
TOPICAL | Status: DC | PRN
  Administered 2022-09-24: 1500 mL

## 2022-09-24 MED ORDER — ACETAMINOPHEN 10 MG/ML IV SOLN: 1000.0000 mg | Freq: Once | INTRAVENOUS | Status: DC | PRN

## 2022-09-24 MED ORDER — MIDAZOLAM HCL 2 MG/2ML IJ SOLN
INTRAMUSCULAR | Status: DC | PRN
  Administered 2022-09-24: 2 mg via INTRAVENOUS

## 2022-09-24 SURGICAL SUPPLY — 59 items
ADH SKN CLS APL DERMABOND .7 (GAUZE/BANDAGES/DRESSINGS)
APL PRP STRL LF DISP 70% ISPRP (MISCELLANEOUS)
BAG COUNTER SPONGE SURGICOUNT (BAG) ×2 IMPLANT
BAG SPNG CNTER NS LX DISP (BAG) ×1
BLADE SAGITTAL WIDE XTHICK NO (BLADE) ×2 IMPLANT
BLADE SURG SZ10 CARB STEEL (BLADE) ×2 IMPLANT
BNDG CMPR 5X4 CHSV STRCH STRL (GAUZE/BANDAGES/DRESSINGS) ×1
BNDG CMPR STD VLCR NS LF 5.8X4 (GAUZE/BANDAGES/DRESSINGS)
BNDG COHESIVE 4X5 TAN STRL LF (GAUZE/BANDAGES/DRESSINGS) ×2 IMPLANT
BNDG ELASTIC 4X5.8 VLCR NS LF (GAUZE/BANDAGES/DRESSINGS) ×2 IMPLANT
BNDG GAUZE DERMACEA FLUFF 4 (GAUZE/BANDAGES/DRESSINGS) ×2 IMPLANT
BNDG GZE DERMACEA 4 6PLY (GAUZE/BANDAGES/DRESSINGS)
BRUSH SCRUB EZ 4% CHG (MISCELLANEOUS) ×2 IMPLANT
CANISTER WOUND CARE 500ML ATS (WOUND CARE) IMPLANT
CHLORAPREP W/TINT 26 (MISCELLANEOUS) ×2 IMPLANT
DERMABOND ADVANCED .7 DNX12 (GAUZE/BANDAGES/DRESSINGS) ×4 IMPLANT
DRAPE INCISE IOBAN 66X45 STRL (DRAPES) ×2 IMPLANT
DRSG GAUZE FLUFF 36X18 (GAUZE/BANDAGES/DRESSINGS) ×2 IMPLANT
DRSG VAC GRANUFOAM MED (GAUZE/BANDAGES/DRESSINGS) IMPLANT
ELECT CAUTERY BLADE 6.4 (BLADE) ×2 IMPLANT
ELECT REM PT RETURN 9FT ADLT (ELECTROSURGICAL) ×1
ELECTRODE REM PT RTRN 9FT ADLT (ELECTROSURGICAL) ×2 IMPLANT
GAUZE SPONGE 4X4 12PLY STRL (GAUZE/BANDAGES/DRESSINGS) IMPLANT
GLOVE BIO SURGEON STRL SZ7 (GLOVE) ×4 IMPLANT
GLOVE INDICATOR 7.5 STRL GRN (GLOVE) ×2 IMPLANT
GLOVE SURG SYN 8.0 (GLOVE) ×3 IMPLANT
GLOVE SURG SYN 8.0 PF PI (GLOVE) ×2 IMPLANT
GOWN STRL REUS W/ TWL LRG LVL3 (GOWN DISPOSABLE) ×4 IMPLANT
GOWN STRL REUS W/ TWL XL LVL3 (GOWN DISPOSABLE) ×2 IMPLANT
GOWN STRL REUS W/TWL LRG LVL3 (GOWN DISPOSABLE) ×2
GOWN STRL REUS W/TWL XL LVL3 (GOWN DISPOSABLE) ×1
HANDLE YANKAUER SUCT BULB TIP (MISCELLANEOUS) ×2 IMPLANT
KIT TURNOVER KIT A (KITS) ×2 IMPLANT
LABEL OR SOLS (LABEL) ×2 IMPLANT
MANIFOLD NEPTUNE II (INSTRUMENTS) ×2 IMPLANT
MAT ABSORB FLUID 56X50 GRAY (MISCELLANEOUS) ×2 IMPLANT
NS IRRIG 500ML POUR BTL (IV SOLUTION) ×2 IMPLANT
PACK EXTREMITY ARMC (MISCELLANEOUS) ×2 IMPLANT
PAD ABD DERMACEA PRESS 5X9 (GAUZE/BANDAGES/DRESSINGS) ×2 IMPLANT
PAD PREP OB/GYN DISP 24X41 (PERSONAL CARE ITEMS) ×2 IMPLANT
SPONGE T-LAP 18X18 ~~LOC~~+RFID (SPONGE) ×4 IMPLANT
STAPLER SKIN PROX 35W (STAPLE) IMPLANT
STOCKINETTE M/LG 89821 (MISCELLANEOUS) ×2 IMPLANT
SUT MNCRL 4-0 (SUTURE) ×2
SUT MNCRL 4-0 27XMFL (SUTURE) ×2
SUT SILK 2 0 (SUTURE) ×1
SUT SILK 2-0 18XBRD TIE 12 (SUTURE) ×2 IMPLANT
SUT SILK 3 0 (SUTURE) ×1
SUT SILK 3-0 18XBRD TIE 12 (SUTURE) ×2 IMPLANT
SUT VIC AB 0 CT1 36 (SUTURE) ×8 IMPLANT
SUT VIC AB 3-0 SH 27 (SUTURE) ×2
SUT VIC AB 3-0 SH 27X BRD (SUTURE) ×4 IMPLANT
SUT VICRYL PLUS ABS 0 54 (SUTURE) ×2 IMPLANT
SUTURE MNCRL 4-0 27XMF (SUTURE) ×2 IMPLANT
SYR 20ML LL LF (SYRINGE) ×4 IMPLANT
TAPE UMBILICAL 1/8 X36 TWILL (MISCELLANEOUS) ×2 IMPLANT
TOWEL OR 17X26 4PK STRL BLUE (TOWEL DISPOSABLE) ×4 IMPLANT
TRAP FLUID SMOKE EVACUATOR (MISCELLANEOUS) ×2 IMPLANT
WATER STERILE IRR 500ML POUR (IV SOLUTION) ×2 IMPLANT

## 2022-09-24 NOTE — Anesthesia Procedure Notes (Signed)
Procedure Name: Intubation Date/Time: 09/24/2022 9:31 AM  Performed by: Hezzie Bump, CRNAPre-anesthesia Checklist: Patient identified, Patient being monitored, Timeout performed, Emergency Drugs available and Suction available Patient Re-evaluated:Patient Re-evaluated prior to induction Oxygen Delivery Method: Circle system utilized Preoxygenation: Pre-oxygenation with 100% oxygen Induction Type: IV induction Ventilation: Mask ventilation without difficulty Laryngoscope Size: 3 and McGraph Grade View: Grade I Tube type: Oral Tube size: 7.0 mm Number of attempts: 1 Airway Equipment and Method: Stylet Placement Confirmation: ETT inserted through vocal cords under direct vision, positive ETCO2 and breath sounds checked- equal and bilateral Secured at: 21 cm Tube secured with: Tape Dental Injury: Teeth and Oropharynx as per pre-operative assessment

## 2022-09-24 NOTE — Op Note (Signed)
   OPERATIVE NOTE   PROCEDURE: Left below-the-knee amputation  PRE-OPERATIVE DIAGNOSIS: Left foot gangrene status post guillotine amputation at the ankle  POST-OPERATIVE DIAGNOSIS: same as above  SURGEON: Renford Dills, MD  ASSISTANT(S): Rolla Plate, NP  ANESTHESIA: general  ESTIMATED BLOOD LOSS: 25 cc  FINDING(S): Muscle bellies appear viable moderate edema of the soft tissues  SPECIMEN(S):  Left below-the-knee amputation  INDICATIONS:   Nancy [name] is a 57 y.o. female who presents with left leg gangrene, status post guillotine amputation last week.  The patient is scheduled for a left below-the-knee amputation.  I discussed in depth with the patient the risks, benefits, and alternatives to this procedure.  The patient is aware that the risk of this operation included but are not limited to:  bleeding, infection, myocardial infarction, stroke, death, failure to heal amputation wound, and possible need for more proximal amputation.  The patient is aware of the risks and agrees proceed forward with the procedure.  DESCRIPTION:  After full informed written consent was obtained from the patient, the patient was brought back to the operating room, and placed supine upon the operating table.  Prior to induction, the patient received IV antibiotics.  The patient was then prepped and draped in the standard fashion for a below-the-knee amputation.  After obtaining adequate anesthesia, the patient was prepped and draped in the standard fashion for a left below-the-knee amputation.  I marked out the anterior incision two finger breadths below the tibial tuberosity and then the marked out a posterior flap that was one third of the circumference of the calf in length.   I made the incisions for these flaps, and then dissected through the subcutaneous tissue, fascia, and muscle anteriorly.  I elevated  the periosteal tissue superiorly so that the tibia was about 3-4 cm shorter than the  anterior skin flap.  I then transected the tibia with a power saw and then took a wedge off the tibia anteriorly with the power saw.  Then I smoothed out the rough edges.  In a similar fashion, I cut back the fibula about two centimeters higher than the level of the tibia with a bone cutter.  I put a bone hook into the distal tibia and then used a large amputation knife to sharply develop a tissue plane through the muscle along the fibula.  In such fashion, the posterior flap was developed.  At this point, the specimen was passed off the field as the below-the-knee amputation.  At this point, I clamped all visibly bleeding arteries and veins using a combination of suture ligation with Silk suture and electrocautery.  Bleeding continued to be controlled with electrocautery and suture ligature.  The stump was washed off with sterile normal saline and no further active bleeding was noted.  I reapproximated the anterior and posterior fascia  with interrupted stitches of 0 Vicryl.  This was completed along the entire length of anterior and posterior fascia until there were no more loose space in the fascial line. The skin was then  reapproximated with 4-0 Monocryl subcuticular.  The stump was washed off and dried.  The incision was dressed with Xeroform and  then fluffs were applied.  Kerlix was wrapped around the leg and then gently an ACE wrap was applied.    COMPLICATIONS: none  CONDITION: stable   [name]  09/24/2022, 11:07 AM    This note was created with Dragon Medical transcription system. Any errors in dictation are purely unintentional.

## 2022-09-24 NOTE — Progress Notes (Signed)
Progress Note   Patient: Nancy [name] WJX:[id number] DOB: 1965-04-08 DOA: 09/18/2022     6 DOS: the patient was seen and examined on 09/24/2022   Brief hospital course: Nancy [name] is a 57 y.o. female with medical history significant of type 2 diabetes, CAD, asthma, chronic pain, diabetic foot infection, GERD, hyperlipidemia, Takotsubo cardiomyopathy presented with worsening left foot pain over the past 2 to 3 days. In the ED, Tmax 101.2, heart rate 90s, respiration stable, blood pressure 160s to 170s over 60s to 70s. Satting well on room air. White count 20, hemoglobin 10.5, platelets 460, sed rate 80, creatinine 0.97 glucose 300s. Pancultured in the ER. Lactate 2.1. Left heel imaging with posterior heel ulcer with large amount of gas with the heel pad and posterior heel consistent with heel ulcer and necrotic infection. Positive osteomyelitic changes. Podiatry and vascular surgery evaluated her.  Patient is admitted to the hospitalist service for sepsis secondary to diabetic foot infection, osteomyelitis. 09/19/22 she had left ankle amputation with plan for BKA coming Tuesday.   Assessment and Plan: Sepsis secondary to diabetic foot infection Osteomyelitis (HCC) Sepsis resolved. Worsening L heel pain w/ gas gangrene secondary to posterior heel ulceration with osteomyelitis on imaging  Vascular surgery performed open guillotine amputation left foot 09/19/22. Wound vac placed.  She had Left BKA 09/24/22. Continue antibiotics, pain control. Follow ID, culture results.  Staphylococcus aureus Bacteremia Source infected lt heel wound. Continue Ancef. repeat Blood cultures negative so far. TTE LVEF 60-65%, no wall motion abnormality.  No significant valvular abnormality, no vegetations, negative PFO ID requested transesophageal echocardiogram, cardiology planned to do TEE Wednesday 9/18. NPO past midnight.  Diabetes mellitus, type 2 (HCC) Poorly controlled Blood sugars better.   Continue Semglee 30units daily, pre-meal to 8 units TID. Continue accucheks, sliding-scale insulin.  Hyperkalemia- Resolved. Resumed Lisinopril. Avoid nephrotoxic drugs.  Takotsubo cardiomyopathy Baseline history of transient Takotsubo cardiomyopathy Appears euvolemic at present No active chest pain.  Pseudohyponatremia: Due to her high blood sugars. Continue to trend sodium, correction of blood sugars.  Acute on chronic anemia: In the setting of sepsis, iron deficiency Hb dropped to 8.6 post amputation surgery. Monitor H/H, transfuse for Hb less than 7. Oral supplementation upon dc.  Mixed hyperlipidemia Stable Continue statin  Asthma without status asthmaticus Stable from respiratory standpoint Continue home inhalers  Essential hypertension BP stable Continue Coreg, Lisinopril.  Morbid obesity with BMI 40.46: Contributing to her current condition. Diet, exercise and lifestyle modification advised. Weight reduction advised.     Subjective: Patient is seen and examined today morning after her surgery.  Family at bedside. She has pain but tolerable. Eating poor. Wonders why her sugars high. No nausea.  Physical Exam: Vitals:   09/24/22 1200 09/24/22 1205 09/24/22 1259 09/24/22 1518  BP: 134/64  (!) 141/74 (!) 156/69  Pulse: 70 69 69 85  Resp: 17 13 16 14   Temp: (!) 97 F (36.1 C)  98.1 F (36.7 C) 97.8 F (36.6 C)  TempSrc:   Axillary   SpO2: 94% 95% 95% 97%  Weight:      Height:       General -middle aged obese Caucasian female, no apparent distress HEENT - PERRLA, EOMI, atraumatic head, non tender sinuses. Lung - Clear, rales, rhonchi, wheezes. Heart - S1, S2 heard, no murmurs, rubs, no pedal edema. Abdomen-soft, obese, nontender, bowel sounds good Neuro - Alert, awake and oriented x 3, non focal exam. Skin - Warm and dry.  Left BKA stump with wound.  Data Reviewed:     Latest Ref Rng & Units 09/24/2022    3:42 AM 09/23/2022    2:37 AM  09/22/2022    5:41 AM  CBC  WBC 4.0 - 10.5 K/uL 13.6  13.9  13.8   Hemoglobin 12.0 - 15.0 g/dL 9.0  9.6  9.8   Hematocrit 36.0 - 46.0 % 28.0  29.6  30.0   Platelets 150 - 400 K/uL 557  589  622        Latest Ref Rng & Units 09/24/2022    3:42 AM 09/23/2022    2:37 AM 09/22/2022    5:41 AM  BMP  Glucose 70 - 99 mg/dL 147  829  562   BUN 6 - 20 mg/dL 20  23  35   Creatinine 0.44 - 1.00 mg/dL 1.30  8.65  7.84   Sodium 135 - 145 mmol/L 136  137  137   Potassium 3.5 - 5.1 mmol/L 4.5  4.6  4.6   Chloride 98 - 111 mmol/L 102  101  101   CO2 22 - 32 mmol/L 28  26  27    Calcium 8.9 - 10.3 mg/dL 8.3  8.6  8.6    No results found.  Family Communication: Discussed with patient's family yesterday, they agree with current care plan  Disposition: Status is: Inpatient Remains inpatient appropriate because: amputation surgery  Planned Discharge Destination: Home with Home Health and Rehab   MDM level 3-patient presented with sepsis, diabetic foot infection, osteomyelitis, bacteremia, uncontrolled blood sugars.  Patient is currently on antibiotic coverage.  She had BKA amputation surgery and she is at high risk for clinical deterioration.  Author: Marcelino Duster, MD 09/24/2022 4:04 PM  For on call review [email or web address].

## 2022-09-24 NOTE — Progress Notes (Signed)
Inpatient Rehab Admissions Coordinator:    Pt. Had L BKA today, Will see how she does post op with therapies and determine candidacy for CIR at that time.   Megan Salon, MS, CCC-SLP Rehab Admissions Coordinator  [phone] (celll) [phone] (office)

## 2022-09-24 NOTE — Plan of Care (Signed)
  Problem: Coping: Goal: Ability to adjust to condition or change in health will improve Outcome: Progressing   Problem: Tissue Perfusion: Goal: Adequacy of tissue perfusion will improve Outcome: Progressing   Problem: Fluid Volume: Goal: Hemodynamic stability will improve Outcome: Progressing   Problem: Clinical Measurements: Goal: Diagnostic test results will improve Outcome: Progressing   Problem: Respiratory: Goal: Ability to maintain adequate ventilation will improve Outcome: Progressing   Problem: Education: Goal: Knowledge of General Education information will improve Description: Including pain rating scale, medication(s)/side effects and non-pharmacologic comfort measures Outcome: Progressing

## 2022-09-24 NOTE — Plan of Care (Signed)
Problem: Education: Goal: Knowledge of General Education information will improve Description: Including pain rating scale, medication(s)/side effects and non-pharmacologic comfort measures Outcome: Progressing   Problem: Health Behavior/Discharge Planning: Goal: Ability to manage health-related needs will improve Outcome: Progressing   Problem: Clinical Measurements: Goal: Ability to maintain clinical measurements within normal limits will improve Outcome: Progressing   Problem: Activity: Goal: Risk for activity intolerance will decrease Outcome: Progressing   Problem: Nutrition: Goal: Adequate nutrition will be maintained Outcome: Progressing   Problem: Coping: Goal: Level of anxiety will decrease Outcome: Progressing   Problem: Elimination: Goal: Will not experience complications related to bowel motility Outcome: Progressing Goal: Will not experience complications related to urinary retention Outcome: Progressing   Problem: Pain Managment: Goal: General experience of comfort will improve Outcome: Progressing

## 2022-09-24 NOTE — Anesthesia Preprocedure Evaluation (Signed)
Anesthesia Evaluation  Patient identified by MRN, date of birth, ID band Patient awake    Reviewed: Allergy & Precautions, NPO status , Patient's Chart, lab work & pertinent test results  History of Anesthesia Complications (+) PONV and history of anesthetic complications  Airway Mallampati: III  TM Distance: >3 FB Neck ROM: full    Dental no notable dental hx. (+) Teeth Intact   Pulmonary asthma , former smoker   Pulmonary exam normal breath sounds clear to auscultation       Cardiovascular hypertension, On Medications and On Home Beta Blockers + CAD, + Peripheral Vascular Disease and +CHF  Normal cardiovascular exam Rhythm:Regular Rate:Normal - Systolic murmurs Takotsubo Cardiomyopathy   Neuro/Psych  Headaches PSYCHIATRIC DISORDERS Anxiety Depression       GI/Hepatic Neg liver ROS,GERD  Medicated,,  Endo/Other  diabetes, Poorly Controlled, Type 2, Insulin Dependent    Renal/GU      Musculoskeletal   Abdominal  (+) + obese  Peds  Hematology negative hematology ROS (+)   Anesthesia Other Findings Past Medical History: 09/21/2018: Abnormal cardiovascular stress test     Comment:  Formatting of this note might be different from the               original. Lexiscan Myoview 09/16/2018 revealed mild               anterior ischemia 05/10/2014: Adverse effect of motion No date: Anxiety No date: Arthritis     Comment:  r knee No date: Asthma 05/10/2014: Breast cyst No date: CAD (coronary artery disease)     Comment:  a.) LHC 06/04/2009 at [facility]; non-obstructive CAD. b.) CTA               with FFR 10/08/2018: extensive mixed plaque proximal to               mid LAD (51-69%); Coronary Ca score 217; FFR 0.71 dPDA,               0.86 mLAD, 0.87 dLCx. 06/06/2008: CCF (congestive cardiac failure) (HCC)     Comment:  a.) 30% EF. b.) TTE 06/03/2011: EF >55%; triv MR. c.)               TTE 04/27/2018: EF 55%; mild LVH; triv  PR, mild MR/TR;               G1DD. 07/02/2016: Chest pain with high risk for cardiac etiology 02/13/2022: Chronic use of opiate drug for therapeutic purpose     Comment:  Formatting of this note might be different from the               original. [facility] signed on 04/18/16 & updated               03/04/17; UDS done on 04/18/16. No date: Complication of anesthesia 03/13/2015: Diabetic foot infection (HCC) 02/08/2021: Diabetic foot ulcer (HCC) No date: Eczema No date: Family history of adverse reaction to anesthesia     Comment:  a.) PONV in mother and grandmother 05/10/2014: GERD (gastroesophageal reflux disease) No date: History of kidney stones No date: HLD (hyperlipidemia) 03/13/2015: Hyponatremia No date: Migraines No date: Motion sickness 02/13/2022: Other acute osteomyelitis, left ankle and foot (HCC) No date: Panic attacks 04/27/2018: Peripheral edema No date: Pneumonia No date: PONV (postoperative nausea and vomiting) 03/13/2015: Sepsis secondary to diabetic foot infection No date: T2DM (type 2 diabetes mellitus) (HCC) 05/10/2014: Takotsubo cardiomyopathy / transient apical balooning  syndrome /  stress-induced cardiomyopathy No date: Unspecified essential hypertension  Past Surgical History: 03/16/2015: AMPUTATION TOE; Left     Comment:  Procedure: left fifth toe amputation with incision and               drainage;  Surgeon: Gwyneth Revels, DPM;  Location: [facility]               ORS;  Service: Podiatry;  Laterality: Left; 02/14/2021: APPLICATION OF WOUND VAC; Left     Comment:  Procedure: APPLICATION OF WOUND VAC;  Surgeon: Gwyneth Revels, DPM;  Location: [facility] ORS;  Service: Podiatry;                Laterality: Left; 04/16/2021: APPLICATION OF WOUND VAC; Left     Comment:  Procedure: APPLICATION OF WOUND VAC;  Surgeon:               Peggye Form, DO;  Location: [facility];  Service: Plastics;  Laterality:  Left; 06/04/2009: CARDIAC CATHETERIZATION; Left     Comment:  Procedure: CARDIAC CATHETERIZATION; Location: [facility] No date: CHOLECYSTECTOMY 04/16/2021: DEBRIDEMENT AND CLOSURE WOUND; Left     Comment:  Procedure: DEBRIDEMENT AND CLOSURE WOUND;  Surgeon:               Peggye Form, DO;  Location: [facility];  Service: Plastics;  Laterality: Left;  1 hour 02/08/2021: IRRIGATION AND DEBRIDEMENT FOOT; Left     Comment:  Procedure: IRRIGATION AND DEBRIDEMENT FOOT - LFT HEEL               ULCER;  Surgeon: Gwyneth Revels, DPM;  Location: [facility]               ORS;  Service: Podiatry;  Laterality: Left; 02/14/2021: IRRIGATION AND DEBRIDEMENT FOOT; Left     Comment:  Procedure: IRRIGATION AND DEBRIDEMENT LEFT HEEL;                Surgeon: Gwyneth Revels, DPM;  Location: [facility] ORS;                Service: Podiatry;  Laterality: Left; No date: kidney stone removal No date: KNEE ARTHROSCOPY W/ MENISCAL REPAIR 01/07/1989: NOSE SURGERY     Comment:  due to fracture No date: TOE AMPUTATION     Comment:  second toe No date: TONSILLECTOMY 01/11/2011: TOTAL KNEE ARTHROPLASTY     Comment:  Procedure: TOTAL KNEE ARTHROPLASTY;  Surgeon: Harvie Junior;  Location: [facility] OR;  Service: Orthopedics;                Laterality: Right;  COMPUTER ASSISTED TOTAL KNEE               REPLACEMENT  BMI    Body Mass Index: 40.46 kg/m      Reproductive/Obstetrics negative OB ROS                             Anesthesia Physical Anesthesia Plan  ASA: 3  Anesthesia Plan: General   Post-op Pain  Management: Toradol IV (intra-op)* and Ofirmev IV (intra-op)*   Induction: Intravenous  PONV Risk Score and Plan: 4 or greater and Ondansetron, Dexamethasone, Treatment may vary due to age or medical condition, Midazolam, Propofol infusion and TIVA  Airway Management Planned: Oral ETT  Additional Equipment:   Intra-op  Plan:   Post-operative Plan: Extubation in OR  Informed Consent: I have reviewed the patients History and Physical, chart, labs and discussed the procedure including the risks, benefits and alternatives for the proposed anesthesia with the patient or authorized representative who has indicated his/her understanding and acceptance.     Dental Advisory Given  Plan Discussed with: Anesthesiologist, CRNA and Surgeon  Anesthesia Plan Comments: (Patient consented for risks of anesthesia including but not limited to:  - adverse reactions to medications - damage to eyes, teeth, lips or other oral mucosa - nerve damage due to positioning  - sore throat or hoarseness - Damage to heart, brain, nerves, lungs, other parts of body or loss of life  Patient voiced understanding.)       Anesthesia Quick Evaluation

## 2022-09-24 NOTE — Transfer of Care (Signed)
Immediate Anesthesia Transfer of Care Note  Patient: Nancy [name]  Procedure(s) Performed: AMPUTATION BELOW KNEE WITH WOUND CLOSURE (Left: Knee)  Patient Location: PACU  Anesthesia Type:General  Level of Consciousness: drowsy and patient cooperative  Airway & Oxygen Therapy: Patient Spontanous Breathing and Patient connected to face mask oxygen  Post-op Assessment: Report given to RN and Post -op Vital signs reviewed and stable  Post vital signs: Reviewed and stable  Last Vitals:  Vitals Value Taken Time  BP 132/65 09/24/22 1134  Temp    Pulse 71 09/24/22 1135  Resp 19 09/24/22 1135  SpO2 100 % 09/24/22 1135  Vitals shown include unfiled device data.  Last Pain:  Vitals:   09/24/22 0837  TempSrc: Oral  PainSc:       Patients Stated Pain Goal: 2 (09/20/22 1015)  Complications: No notable events documented.

## 2022-09-24 NOTE — Anesthesia Postprocedure Evaluation (Signed)
Anesthesia Post Note  Patient: Nancy [name]  Procedure(s) Performed: AMPUTATION BELOW KNEE WITH WOUND CLOSURE (Left: Knee)  Patient location during evaluation: PACU Anesthesia Type: General Level of consciousness: awake and alert, oriented and patient cooperative Pain management: pain level controlled Vital Signs Assessment: post-procedure vital signs reviewed and stable Respiratory status: spontaneous breathing, nonlabored ventilation and respiratory function stable Cardiovascular status: blood pressure returned to baseline and stable Postop Assessment: adequate PO intake Anesthetic complications: no   No notable events documented.   Last Vitals:  Vitals:   09/24/22 1200 09/24/22 1205  BP: 134/64   Pulse: 70 69  Resp: 17 13  Temp: (!) 36.1 C   SpO2: 94% 95%    Last Pain:  Vitals:   09/24/22 1205  TempSrc:   PainSc: Asleep                 Reed Breech

## 2022-09-24 NOTE — Interval H&P Note (Signed)
History and Physical Interval Note:  09/24/2022 9:02 AM  Nancy [name]  has presented today for surgery, with the diagnosis of osteomyelitis.  The various methods of treatment have been discussed with the patient and family. After consideration of risks, benefits and other options for treatment, the patient has consented to  Procedure(s): AMPUTATION BELOW KNEE WITH WOUND CLOSURE (Left) as a surgical intervention.  The patient's history has been reviewed, patient examined, no change in status, stable for surgery.  I have reviewed the patient's chart and labs.  Questions were answered to the patient's satisfaction.     [name]

## 2022-09-24 NOTE — Progress Notes (Signed)
Date of Admission:  09/18/2022      ID: Nancy [name] is a 56 y.o. female Principal Problem:   MSSA bacteremia Active Problems:   Essential hypertension   Asthma without status asthmaticus   Mixed hyperlipidemia   Takotsubo cardiomyopathy   Diabetes mellitus, type 2 (HCC)   Sepsis secondary to diabetic foot infection   Diabetic ulcer of left heel associated with type 2 diabetes mellitus (HCC)   Diabetic foot infection (HCC)   Osteomyelitis (HCC)   Cellulitis of left lower extremity   Gas gangrene (HCC)    Subjective: Doing better Had left BKA today  Medications:   vitamin C  500 mg Oral BID   bisacodyl  10 mg Oral QHS   carvedilol  6.25 mg Oral BID   chlorhexidine  60 mL Topical Once   enoxaparin (LOVENOX) injection  60 mg Subcutaneous Q24H   gabapentin  100 mg Oral TID   insulin aspart  0-15 Units Subcutaneous TID WC   insulin aspart  0-5 Units Subcutaneous QHS   insulin aspart  8 Units Subcutaneous TID WC   insulin glargine-yfgn  30 Units Subcutaneous Daily   iron polysaccharides  150 mg Oral Daily   [START ON 09/25/2022] lisinopril  5 mg Oral Daily   multivitamin with minerals  1 tablet Oral Daily   pantoprazole  40 mg Oral Daily   polyethylene glycol  17 g Oral BID   Ensure Max Protein  11 oz Oral BID    Objective: Vital signs in last 24 hours: Patient Vitals for the past 24 hrs:  BP Temp Temp src Pulse Resp SpO2 Weight  09/24/22 2040 (!) 150/61 98.9 F (37.2 C) -- 80 18 100 % --  09/24/22 1518 (!) 156/69 97.8 F (36.6 C) -- 85 14 97 % --  09/24/22 1259 (!) 141/74 98.1 F (36.7 C) Axillary 69 16 95 % --  09/24/22 1205 -- -- -- 69 13 95 % --  09/24/22 1200 134/64 (!) 97 F (36.1 C) -- 70 17 94 % --  09/24/22 1155 -- -- -- 70 13 93 % --  09/24/22 1150 -- -- -- (!) 55 (!) 22 (!) 87 % --  09/24/22 1148 -- -- -- 71 18 94 % --  09/24/22 1145 131/67 -- -- 70 (!) 21 94 % --  09/24/22 1134 132/65 -- -- -- 16 98 % --  09/24/22 1133 132/65 98.1 F (36.7  C) -- 70 18 100 % --  09/24/22 0837 (!) 182/60 -- Oral 83 16 97 % --  09/24/22 0726 -- -- -- -- -- -- 129.9 kg  09/23/22 2338 (!) 160/60 98.6 F (37 C) -- 81 16 98 % --      PHYSICAL EXAM:  General: Alert, cooperative, no distress, appears stated age.  Head: Normocephalic, without obvious abnormality, atraumatic. Eyes: Conjunctivae clear, anicteric sclerae. Pupils are equal ENT Nares normal. No drainage or sinus tenderness. Lips, mucosa, and tongue normal. No Thrush Neck: Supple, symmetrical, no adenopathy, thyroid: non tender no carotid bruit and no JVD. Back: No CVA tenderness. Lungs: Clear to auscultation bilaterally. No Wheezing or Rhonchi. No rales. Heart: Regular rate and rhythm, no murmur, rub or gallop. Abdomen: Soft, non-tender,not distended. Bowel sounds normal. No masses Extremities: left BKA Skin: No rashes or lesions. Or bruising Lymph: Cervical, supraclavicular normal. Neurologic: Grossly non-focal  Lab Results    Latest Ref Rng & Units 09/24/2022    3:42 AM 09/23/2022    2:37 AM 09/22/2022  5:41 AM  CBC  WBC 4.0 - 10.5 K/uL 13.6  13.9  13.8   Hemoglobin 12.0 - 15.0 g/dL 9.0  9.6  9.8   Hematocrit 36.0 - 46.0 % 28.0  29.6  30.0   Platelets 150 - 400 K/uL 557  589  622        Latest Ref Rng & Units 09/24/2022    3:42 AM 09/23/2022    2:37 AM 09/22/2022    5:41 AM  CMP  Glucose 70 - 99 mg/dL 161  096  045   BUN 6 - 20 mg/dL 20  23  35   Creatinine 0.44 - 1.00 mg/dL 4.09  8.11  9.14   Sodium 135 - 145 mmol/L 136  137  137   Potassium 3.5 - 5.1 mmol/L 4.5  4.6  4.6   Chloride 98 - 111 mmol/L 102  101  101   CO2 22 - 32 mmol/L 28  26  27    Calcium 8.9 - 10.3 mg/dL 8.3  8.6  8.6       Microbiology: [facility] MSSa 09/18/22 Repeat BC 09/19/22    Assessment/Plan:  MSSA bacteremia- source is the left heel ulcer/infection/of the foot Ischemic /gangrenous changes- underwent guillotine amputation on 9/12 Followed by BKA today 09/24/22 S/p BKA Currently on  cefazolin  Need TEE to look for endocarditis Duration will be decided after that  Poorly controlled DM  Anemia  Discussed the management with patient and mother at bed side

## 2022-09-25 ENCOUNTER — Encounter: Payer: Self-pay | Admitting: Vascular Surgery

## 2022-09-25 DIAGNOSIS — B9561 Methicillin susceptible Staphylococcus aureus infection as the cause of diseases classified elsewhere: Secondary | ICD-10-CM | POA: Diagnosis not present

## 2022-09-25 DIAGNOSIS — E11621 Type 2 diabetes mellitus with foot ulcer: Secondary | ICD-10-CM | POA: Diagnosis not present

## 2022-09-25 DIAGNOSIS — L97429 Non-pressure chronic ulcer of left heel and midfoot with unspecified severity: Secondary | ICD-10-CM | POA: Diagnosis not present

## 2022-09-25 DIAGNOSIS — A480 Gas gangrene: Secondary | ICD-10-CM | POA: Diagnosis not present

## 2022-09-25 DIAGNOSIS — R7881 Bacteremia: Secondary | ICD-10-CM | POA: Diagnosis not present

## 2022-09-25 LAB — GLUCOSE, CAPILLARY
Glucose-Capillary: 210 mg/dL — ABNORMAL HIGH (ref 70–99)
Glucose-Capillary: 224 mg/dL — ABNORMAL HIGH (ref 70–99)
Glucose-Capillary: 198 mg/dL — ABNORMAL HIGH (ref 70–99)
Glucose-Capillary: 185 mg/dL — ABNORMAL HIGH (ref 70–99)

## 2022-09-25 MED ORDER — AMLODIPINE BESYLATE 5 MG PO TABS
5.0000 mg | ORAL_TABLET | Freq: Every day | ORAL | Status: DC
  Administered 2022-09-25 – 2022-09-30 (×6): 5 mg via ORAL
  Filled 2022-09-25 (×6): qty 1

## 2022-09-25 MED ORDER — GLUCERNA SHAKE PO LIQD: 237.0000 mL | Freq: Two times a day (BID) | ORAL | Status: DC

## 2022-09-25 MED ORDER — ZINC SULFATE 220 (50 ZN) MG PO CAPS
220.0000 mg | ORAL_CAPSULE | Freq: Every day | ORAL | Status: DC
  Administered 2022-09-25 – 2022-09-30 (×6): 220 mg via ORAL
  Filled 2022-09-25 (×6): qty 1

## 2022-09-25 MED ORDER — HYDROMORPHONE HCL 1 MG/ML IJ SOLN
0.5000 mg | INTRAMUSCULAR | Status: DC | PRN
  Administered 2022-09-25: 0.5 mg via INTRAVENOUS
  Filled 2022-09-25: qty 0.5

## 2022-09-25 MED ORDER — ENSURE MAX PROTEIN PO LIQD
11.0000 [oz_av] | Freq: Two times a day (BID) | ORAL | Status: DC
  Administered 2022-09-25 – 2022-09-30 (×10): 11 [oz_av] via ORAL
  Filled 2022-09-25: qty 330

## 2022-09-25 MED ORDER — JUVEN PO PACK
1.0000 | PACK | Freq: Two times a day (BID) | ORAL | Status: DC
  Administered 2022-09-26 – 2022-09-30 (×8): 1 via ORAL

## 2022-09-25 MED ORDER — INSULIN GLARGINE-YFGN 100 UNIT/ML ~~LOC~~ SOLN
35.0000 [IU] | Freq: Every day | SUBCUTANEOUS | Status: DC
  Administered 2022-09-26 – 2022-09-30 (×5): 35 [IU] via SUBCUTANEOUS
  Filled 2022-09-25 (×5): qty 0.35

## 2022-09-25 NOTE — Plan of Care (Signed)
  Problem: Education: Goal: Ability to describe self-care measures that may prevent or decrease complications (Diabetes Survival Skills Education) will improve Outcome: Progressing   Problem: Coping: Goal: Ability to adjust to condition or change in health will improve 09/25/2022 1111 by Corena Pilgrim, LPN Outcome: Progressing 09/25/2022 1110 by Corena Pilgrim, LPN Outcome: Progressing   Problem: Health Behavior/Discharge Planning: Goal: Ability to identify and utilize available resources and services will improve Outcome: Progressing   Problem: Nutritional: Goal: Maintenance of adequate nutrition will improve 09/25/2022 1111 by Corena Pilgrim, LPN Outcome: Progressing 09/25/2022 1110 by Corena Pilgrim, LPN Outcome: Progressing   Problem: Tissue Perfusion: Goal: Adequacy of tissue perfusion will improve Outcome: Progressing   Problem: Fluid Volume: Goal: Hemodynamic stability will improve Outcome: Progressing

## 2022-09-25 NOTE — Progress Notes (Signed)
Nutrition Follow-up  DOCUMENTATION CODES:   Morbid obesity  INTERVENTION:   -Continue 500 mg vitamin C BID -220 mg zinc sulfate daily -Continue Ensure Max po BID, each supplement provides 150 kcal and 30 grams of protein.   -1 packet Juven BID, each packet provides 95 calories, 2.5 grams of protein (collagen), and 9.8 grams of carbohydrate (3 grams sugar); also contains 7 grams of L-arginine and L-glutamine, 300 mg vitamin C, 15 mg vitamin E, 1.2 mcg vitamin B-12, 9.5 mg zinc, 200 mg calcium, and 1.5 g  Calcium Beta-hydroxy-Beta-methylbutyrate to support wound healing  -Continue MVI with minerals daily   NUTRITION DIAGNOSIS:   Increased nutrient needs related to wound healing as evidenced by estimated needs.  Ongoing  GOAL:   Patient will meet greater than or equal to 90% of their needs  Progressing   MONITOR:   PO intake, Supplement acceptance, Labs, Weight trends, I & O's, Skin  REASON FOR ASSESSMENT:   Consult Wound healing  ASSESSMENT:   57 y/o female with h/o HTN, asthma, HLD, cardiomyopathy, DM, CHF, depression, CAD and GERD who is admitted with diabetic foot wound, sepsis and osteomyelitis.  9/12- s/p Open guillotine amputation left foot 9/17- s/p Left below-the-knee amputation   Reviewed I/O's: +990 ml x 24 hours and +1.6 L since admission  UOP: 1.1 L x 24 hours  Spoke with pt and wife at bedside. Pt is pleasant and in good spirits. Wife and pt share that pt is very motivated to all offered interventions and hopeful to CIR stay after hospital discharge for optimal rehab potential.    Pt reports pain and appetite have improved since surgery. Pt shares that she had a poor appetite PTA secondary to paina nd was often eating convenience foods and frozen dinners secondary to limited mobility. Pt shares she consumed 100% of her chicken wrap for lunch. Noted meal completions 100%.   Pt reports that she was taking Ensure MAx and Juven daily at home to optimize  nutritional benefits for wound healing. She is interested in resuming these supplements here.   Pt reports wt gain due to limited mobility. Wt has been stable over the past 6 months.   Pt reports difficulty with DM control secondary to access to medications. Per pt, she has tried both mounjaro and trulicity in the past and had very good control with that, however, unable to stay on medications long term secondary to high co-pays. Discussed importance of good meal and supplement intake including optimal glycemic control for post-op healing. Pt expresses frustration over hyperglycemia in hospital. Discussed stress response after surgery and how blood sugar is impacted by that as well as differences between outpatient and inpatient DM management. Pt very appreciative of RD assistance  Medications reviewed and include vitamin C, dulcolax, and miralax.  Labs reviewed: CBGS: 185-309 (inpatient orders for glycemic control are 0-15 units insulin aspart TID with meals, 0-5 units insulin aspart daily at bedtime, 8 units insulin aspart TID with meals, and 35 units insulin glargine-yfgn daily).    NUTRITION - FOCUSED PHYSICAL EXAM:  Flowsheet Row Most Recent Value  Orbital Region No depletion  Upper Arm Region No depletion  Thoracic and Lumbar Region No depletion  Buccal Region No depletion  Temple Region No depletion  Clavicle Bone Region No depletion  Clavicle and Acromion Bone Region No depletion  Scapular Bone Region No depletion  Dorsal Hand No depletion  Patellar Region No depletion  Anterior Thigh Region No depletion  Posterior Calf Region No depletion  Edema (RD Assessment) None  Hair Reviewed  Eyes Reviewed  Mouth Reviewed  Skin Reviewed  Nails Reviewed       Diet Order:   Diet Order             Diet NPO time specified Except for: Sips with Meds  Diet effective midnight           Diet Carb Modified Fluid consistency: Thin  Diet effective now                   EDUCATION  NEEDS:   Education needs have been addressed  Skin:  Skin Assessment: Skin Integrity Issues: Skin Integrity Issues:: Incisions, Wound VAC Wound Vac: lt BKA Incisions: s/p lt BKA  Last BM:  09/24/22  Height:   Ht Readings from Last 1 Encounters:  09/18/22 5\' 9"  (1.753 m)    Weight:   Wt Readings from Last 1 Encounters:  09/25/22 124.6 kg    Ideal Body Weight:  65.5 kg (adjusted for lt BKA)  BMI:  Body mass index is 40.57 kg/m.  Estimated Nutritional Needs:   Kcal:  1950-2150  Protein:  100-115 grams  Fluid:  > 1.9 L    Levada Schilling, RD, LDN, CDCES Registered Dietitian II Certified Diabetes Care and Education Specialist Please refer to [facility] for RD and/or RD on-call/weekend/after hours pager

## 2022-09-25 NOTE — Progress Notes (Signed)
Progress Note   Patient: Nancy [name]:[id number] DOB: [birth year] DOA: 09/18/2022     7 DOS: the patient was seen and examined on 09/25/2022   Brief hospital course: BURNETT WOMAC is a 57 y.o. female with medical history significant of type 2 diabetes, CAD, asthma, chronic pain, diabetic foot infection, GERD, hyperlipidemia, Takotsubo cardiomyopathy presented with worsening left foot pain over the past 2 to 3 days. In the ED, Tmax 101.2, heart rate 90s, respiration stable, blood pressure 160s to 170s over 60s to 70s. Satting well on room air. White count 20, hemoglobin 10.5, platelets 460, sed rate 80, creatinine 0.97 glucose 300s. Pancultured in the ER. Lactate 2.1. Left heel imaging with posterior heel ulcer with large amount of gas with the heel pad and posterior heel consistent with heel ulcer and necrotic infection. Positive osteomyelitic changes. Podiatry and vascular surgery evaluated her.  Patient is admitted to the hospitalist service for sepsis secondary to diabetic foot infection, osteomyelitis. 09/19/22 she had left ankle amputation with plan for BKA coming Tuesday.   Assessment and Plan: Sepsis secondary to diabetic foot infection Osteomyelitis (HCC) Sepsis resolved. Worsening L heel pain w/ gas gangrene secondary to posterior heel ulceration with osteomyelitis on imaging. Vascular surgery performed open guillotine amputation left foot 09/19/22 with vac placement She had Left BKA 09/24/22.  Continue Cefazolin per ID recommendations. Follow culture results. Pain control.  Staphylococcus aureus Bacteremia Source - infected lt heel wound. Continue Ancef. repeat Blood cultures negative so far. TTE LVEF 60-65%, no wall motion abnormality.  No significant valvular abnormality, no vegetations, negative PFO TEE r/o endocarditis will be scheduled for tomorrow per cardiology. NPO past midnight. Further antibiotic duration per TEE.  Diabetes mellitus, type 2 (HCC) Poorly  controlled Blood sugars 200s  Increased Semglee to 35units daily, pre-meal to 8 units TID. Continue accucheks, sliding-scale insulin.  Hyperkalemia- K 5.0 Hold ACEI. Avoid nephrotoxic drugs.  Takotsubo cardiomyopathy Baseline history of transient Takotsubo cardiomyopathy Appears euvolemic at present No active chest pain.  Pseudohyponatremia: Due to her high blood sugars. Continue to trend sodium, correction of blood sugars.  Acute on chronic anemia: In the setting of sepsis, iron deficiency Hb dropped to 8.4 post amputation surgery. Monitor H/H, transfuse for Hb less than 7. Oral iron supplementation upon dc.  Mixed hyperlipidemia Stable Continue statin  Asthma without status asthmaticus Stable from respiratory standpoint Continue home inhalers  Essential hypertension BP stable Continue Coreg.  Morbid obesity with BMI 40.46: Contributing to her current condition. Diet, exercise and lifestyle modification advised. Weight reduction advised.     Subjective: Patient is seen and examined today morning, she is sitting on edge of bed.  Sister at bedside. Worried about severe pain, wishes IV form on board. Eating fair. Wishes to work with PT.  Physical Exam: Vitals:   09/24/22 2354 09/25/22 0323 09/25/22 0403 09/25/22 0814  BP: (!) 141/47 (!) 124/50  (!) 169/82  Pulse: 76 70  77  Resp: 18 18  16   Temp: 98.5 F (36.9 C) 97.7 F (36.5 C)  98 F (36.7 C)  TempSrc:      SpO2: 100% 97%  99%  Weight:   124.6 kg   Height:       General -middle aged obese Caucasian female, no apparent distress HEENT - PERRLA, EOMI, atraumatic head, non tender sinuses. Lung - Clear, rales, rhonchi, wheezes. Heart - S1, S2 heard, no murmurs, rubs, no pedal edema. Abdomen-soft, obese, nontender, bowel sounds good Neuro - Alert, awake and oriented x  3, non focal exam. Skin - Warm and dry.  Left BKA stump with wound dressing.  Data Reviewed:     Latest Ref Rng & Units 09/25/2022     2:36 AM 09/24/2022    3:42 AM 09/23/2022    2:37 AM  CBC  WBC 4.0 - 10.5 K/uL 17.1  13.6  13.9   Hemoglobin 12.0 - 15.0 g/dL 8.4  9.0  9.6   Hematocrit 36.0 - 46.0 % 27.2  28.0  29.6   Platelets 150 - 400 K/uL 570  557  589        Latest Ref Rng & Units 09/25/2022    2:36 AM 09/24/2022    3:42 AM 09/23/2022    2:37 AM  BMP  Glucose 70 - 99 mg/dL 696  295  284   BUN 6 - 20 mg/dL 20  20  23    Creatinine 0.44 - 1.00 mg/dL 1.32  4.40  1.02   Sodium 135 - 145 mmol/L 136  136  137   Potassium 3.5 - 5.1 mmol/L 5.0  4.5  4.6   Chloride 98 - 111 mmol/L 101  102  101   CO2 22 - 32 mmol/L 26  28  26    Calcium 8.9 - 10.3 mg/dL 8.5  8.3  8.6    No results found.  Family Communication: Discussed with patient, her sister at bedside, they agree with current care plan  Disposition: Status is: Inpatient Remains inpatient appropriate because: post amputation surgery, IV antibiotics, TEE  Planned Discharge Destination: Home with Home Health and Rehab   Time spent  Author: Marcelino Duster, MD 09/25/2022 1:02 PM  For on call review [email or web address].

## 2022-09-25 NOTE — Plan of Care (Signed)
Problem: Education: Goal: Knowledge of General Education information will improve Description: Including pain rating scale, medication(s)/side effects and non-pharmacologic comfort measures Outcome: Progressing   Problem: Health Behavior/Discharge Planning: Goal: Ability to manage health-related needs will improve Outcome: Progressing   Problem: Clinical Measurements: Goal: Ability to maintain clinical measurements within normal limits will improve Outcome: Progressing Goal: Diagnostic test results will improve Outcome: Progressing   Problem: Activity: Goal: Risk for activity intolerance will decrease Outcome: Progressing   Problem: Nutrition: Goal: Adequate nutrition will be maintained Outcome: Progressing   Problem: Coping: Goal: Level of anxiety will decrease Outcome: Progressing   Problem: Elimination: Goal: Will not experience complications related to bowel motility Outcome: Progressing Goal: Will not experience complications related to urinary retention Outcome: Progressing   Problem: Pain Managment: Goal: General experience of comfort will improve Outcome: Progressing   Problem: Safety: Goal: Ability to remain free from injury will improve Outcome: Progressing

## 2022-09-25 NOTE — Evaluation (Signed)
Occupational Therapy Re-Evaluation Patient Details Name: Nancy [name] MRN: [id number] DOB: [birth year] Today's Date: 09/25/2022   History of Present Illness Nancy [name] is a 57 y.o. female who presents with left leg gangrene. 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 PMH includes: anxiety, CAD, DMII, CCF.   Clinical Impression   Nancy [name] was seen for OT re-evaluation on this date. Upon arrival to room pt seated on [facility], supportive family at bed side, pt agreeable to tx. Pt requires MAX A don shoe sitting on BSC improved to SUPERVISION donning seated EOB with modified figure 4 position on bed. MIN A + RW don underwear and LB bathing washing bottom in standing - assist for dynamic balance in standing portion. MIN A + RW for BSC>bed stand pivot t/f. Green theraband provided and BUE HEP reviewed with good return demonstration. Educated on DME recs, falls prevention, and desensitization techniques. Goals updated to reflect progress, will continue to follow POC. Discharge recommendation remains appropriate.     If plan is discharge home, recommend the following: Help with stairs or ramp for entrance;Assist for transportation;A little help with walking and/or transfers;A little help with bathing/dressing/bathroom    Functional Status Assessment  Patient has had a recent decline in their functional status and demonstrates the ability to make significant improvements in function in a reasonable and predictable amount of time.  Equipment Recommendations  BSC/3in1    Recommendations for Other Services       Precautions / Restrictions Precautions Precautions: Fall Restrictions Weight Bearing Restrictions: Yes LLE Weight Bearing: Non weight bearing      Mobility Bed Mobility Overal bed mobility: Modified Independent                  Transfers Overall transfer level: Needs  assistance Equipment used: Rolling walker (2 wheels) Transfers: Sit to/from Stand, Bed to chair/wheelchair/BSC Sit to Stand: Min assist Stand pivot transfers: Min assist                Balance Overall balance assessment: Needs assistance Sitting-balance support: Feet supported, No upper extremity supported Sitting balance-Leahy Scale: Good     Standing balance support: Single extremity supported, During functional activity Standing balance-Leahy Scale: Poor                             ADL either performed or assessed with clinical judgement   ADL Overall ADL's : Needs assistance/impaired                                       General ADL Comments: MAX A don shoe sitting on BSC improved to SUPERVISION donning seated EOB with modified figure 4 position on bed. MIN A + RW don underwear and LB bathing - assist for dynamic balance in standing portion. SETUP seated grooming and UB bathing      Pertinent Vitals/Pain Pain Assessment Pain Assessment: No/denies pain     Extremity/Trunk Assessment Upper Extremity Assessment Upper Extremity Assessment: Generalized weakness   Lower Extremity Assessment Lower Extremity Assessment: Generalized weakness       Communication Communication Communication: No apparent difficulties Cueing Techniques: Verbal cues   Cognition Arousal: Alert Behavior During Therapy: WFL for tasks assessed/performed Overall Cognitive Status: Within Functional Limits for tasks assessed  General Comments       Exercises Other Exercises Other Exercises: green theraband provided and BUE HEP reviewed   Shoulder Instructions      Home Living Family/patient expects to be discharged to:: Private residence Living Arrangements: Spouse/significant other Available Help at Discharge: Family Type of Home: House Home Access: Ramped entrance     Home Layout: One level      Bathroom Shower/Tub: Walk-in shower         Home Equipment: Agricultural consultant (2 wheels);Shower seat;Grab bars - toilet;Grab bars - tub/shower;Hand held shower head;Rollator (4 wheels);Wheelchair - power          Prior Functioning/Environment Prior Level of Function : Independent/Modified Independent             Mobility Comments: Using [id number] for HH mobility. has an electric WC for community distances. Standing brief priods of time with UE support. ADLs Comments: Wife assists with wound care and IADL management, but pt otherwise MOD I for BADL management.        OT Problem List: Decreased strength;Decreased coordination;Pain;Decreased activity tolerance;Decreased safety awareness;Impaired balance (sitting and/or standing);Decreased knowledge of use of DME or AE;Decreased knowledge of precautions      OT Treatment/Interventions: Self-care/ADL training;Therapeutic exercise;Therapeutic activities;DME and/or AE instruction;Patient/family education;Balance training;Energy conservation;Neuromuscular education    OT Goals(Current goals can be found in the care plan section) Acute Rehab OT Goals Patient Stated Goal: to walk OT Goal Formulation: With patient/family Time For Goal Achievement: 10/09/22 Potential to Achieve Goals: Good ADL Goals Pt Will Perform Lower Body Dressing: with modified independence;sit to/from stand Pt Will Transfer to Toilet: with modified independence;stand pivot transfer;bedside commode Pt Will Perform Toileting - Clothing Manipulation and hygiene: with modified independence;sitting/lateral leans;sit to/from stand  OT Frequency: Min 1X/week    Co-evaluation              AM-PAC OT "6 Clicks" Daily Activity     Outcome Measure Help from another person eating meals?: None Help from another person taking care of personal grooming?: A Little Help from another person toileting, which includes using toliet, bedpan, or urinal?: A Lot Help from another person  bathing (including washing, rinsing, drying)?: A Little Help from another person to put on and taking off regular upper body clothing?: A Little Help from another person to put on and taking off regular lower body clothing?: A Lot 6 Click Score: 17   End of Session Equipment Utilized During Treatment: Rolling walker (2 wheels)  Activity Tolerance: Patient tolerated treatment well Patient left: in bed;with call bell/phone within reach;with family/visitor present  OT Visit Diagnosis: Other abnormalities of gait and mobility (R26.89);Muscle weakness (generalized) (M62.81)                Time: 4098-1191 OT Time Calculation (min): 29 min Charges:  OT General Charges $OT Visit: 1 Visit OT Evaluation $OT Re-eval: 1 Re-eval OT Treatments $Self Care/Home Management : 23-37 mins  Kathie Dike, M.S. OTR/L  09/25/22, 1:56 PM  ascom [phone]

## 2022-09-25 NOTE — TOC Progression Note (Signed)
Transition of Care [facility]) - Progression Note    Patient Details  Name: HENA TALLO MRN: [id number] Date of Birth: 27-Oct-1965  Transition of [facility]) CM/SW Contact  Marlowe Sax, RN Phone Number: 09/25/2022, 8:57 AM  Clinical Narrative:    TOC to continue to Monitor and assist with needs To be seen by Therapy and assessed, CIR assessing for candidacy   Will get TEE   Expected Discharge Plan: Home w Home Health Services Barriers to Discharge: Continued Medical Work up  Expected Discharge Plan and Services   Discharge Planning Services: CM Consult   Living arrangements for the past 2 months: Single Family Home                 DME Arranged: N/A DME Agency: NA                   Social Determinants of Health (SDOH) Interventions SDOH Screenings   Depression (PHQ2-9): Low Risk  (05/31/2021)  Tobacco Use: Medium Risk (09/24/2022)    Readmission Risk Interventions     No data to display

## 2022-09-25 NOTE — Evaluation (Signed)
Physical Therapy Re-Evaluation Patient Details Name: Nancy [name] MRN: [id number] DOB: 09-Feb-1965 Today's Date: 09/25/2022  History of Present Illness  Nancy [name] is a 57 y.o. female who presents with left leg gangrene. 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 PMH includes: anxiety, CAD, DMII, CCF. Pt is now S/P L BKA  Clinical Impression  Pt was pleasant and motivated to participate during the session and put forth good effort throughout. Pt is Mod I with bed mobility, and required CGA for STS transfer with BRW and CGA from slightly elevated EOB. Pt provided cues for forwards weight shift with good results, needing encouragement to attempt, pt continues to show mild apprehensiveness when attempting transfer. Once up, pt able to take shuffling steps forwards on RLE, with cues to keep BRW close by and support through BUE's as needed. Pt was able to take shuffling steps forwards ~ 2 feet and turn to sit in recliner, pt given cues for hand placement and eccentric control during descent. Pt will benefit from continued PT services upon discharge to safely address deficits listed in patient problem list for decreased caregiver assistance and eventual return to PLOF.          If plan is discharge home, recommend the following: Help with stairs or ramp for entrance;Assist for transportation;Assistance with cooking/housework;Two people to help with walking and/or transfers;Two people to help with bathing/dressing/bathroom   Can travel by private vehicle        Equipment Recommendations Other (comment) (TBD by next venue of care)  Recommendations for Other Services       Functional Status Assessment Patient has had a recent decline in their functional status and demonstrates the ability to make significant improvements in function in a reasonable and predictable amount of time.     Precautions /  Restrictions Precautions Precautions: Fall Restrictions Weight Bearing Restrictions: Yes LLE Weight Bearing: Non weight bearing      Mobility  Bed Mobility Overal bed mobility: Modified Independent       Supine to sit: Modified independent (Device/Increase time) Sit to supine: Modified independent (Device/Increase time)   General bed mobility comments: able to adjust self in bed as needed    Transfers Overall transfer level: Needs assistance Equipment used: Rolling walker (2 wheels) (BRW) Transfers: Sit to/from Stand Sit to Stand: Contact guard assist, From elevated surface           General transfer comment: Transfers to chair with STS to BRW from slightly elevated EOB, progressing to CGA with cues for forward lean "nose over toes" with good results    Ambulation/Gait Ambulation/Gait assistance: Contact guard assist Gait Distance (Feet): 2 Feet Assistive device: Rolling walker (2 wheels) (BRW)         General Gait Details: Pt able to shuffle RLE forwards then turn to sit in recliner  Stairs            Wheelchair Mobility     Tilt Bed    Modified Rankin (Stroke Patients Only)       Balance Overall balance assessment: Needs assistance Sitting-balance support: Feet supported, No upper extremity supported Sitting balance-Leahy Scale: Good     Standing balance support: Single extremity supported, During functional activity, Reliant on assistive device for balance Standing balance-Leahy Scale: Fair Standing balance comment: Heavy reliance on RW  Pertinent Vitals/Pain Pain Assessment Pain Assessment: Faces Faces Pain Scale: Hurts a little bit Pain Location: LLE Pain Descriptors / Indicators: Aching, Sore, Discomfort Pain Intervention(s): Monitored during session, Limited activity within patient's tolerance    Home Living Family/patient expects to be discharged to:: Private residence Living Arrangements:  Spouse/significant other Available Help at Discharge: Family Type of Home: House Home Access: Ramped entrance       Home Layout: One level Home Equipment: Agricultural consultant (2 wheels);Shower seat;Grab bars - toilet;Grab bars - tub/shower;Hand held shower head;Rollator (4 wheels);Wheelchair - power      Prior Function Prior Level of Function : Independent/Modified Independent             Mobility Comments: Using [id number] for HH mobility. has an electric WC for community distances. Standing brief priods of time with UE support. ADLs Comments: Wife assists with wound care and IADL management, but pt otherwise MOD I for BADL management.     Extremity/Trunk Assessment   Upper Extremity Assessment Upper Extremity Assessment: Overall WFL for tasks assessed    Lower Extremity Assessment Lower Extremity Assessment: Generalized weakness;LLE deficits/detail LLE Deficits / Details: s/p L BKA with bandage intact at start/end of session. Has 0 deg of TKE       Communication   Communication Communication: No apparent difficulties Cueing Techniques: Verbal cues  Cognition Arousal: Alert Behavior During Therapy: WFL for tasks assessed/performed Overall Cognitive Status: Within Functional Limits for tasks assessed                                          General Comments      Exercises Total Joint Exercises Quad Sets: Left, 10 reps, AROM, Strengthening Gluteal Sets: Both, 5 reps, AROM, Strengthening Straight Leg Raises: AROM, Left, 5 reps, Strengthening Long Arc Quad: Strengthening, Left, 5 reps, AROM Other Exercises Other Exercises: Pt educated on LLE resting position and frequency of LLE exercises in order to prevent flexion contracture and promote LLE muscle priming   Assessment/Plan    PT Assessment Patient needs continued PT services  PT Problem List Decreased strength;Pain;Decreased activity tolerance;Decreased balance;Decreased mobility       PT Treatment  Interventions DME instruction;Balance training;Gait training;Neuromuscular re-education;Stair training;Functional mobility training;Patient/family education;Therapeutic activities;Therapeutic exercise    PT Goals (Current goals can be found in the Care Plan section)  Acute Rehab PT Goals Patient Stated Goal: Improve mobility PT Goal Formulation: With patient/family Time For Goal Achievement: 10/08/22 Potential to Achieve Goals: Good    Frequency Min 1X/week     Co-evaluation               AM-PAC PT "6 Clicks" Mobility  Outcome Measure Help needed turning from your back to your side while in a flat bed without using bedrails?: A Little Help needed moving from lying on your back to sitting on the side of a flat bed without using bedrails?: A Little Help needed moving to and from a bed to a chair (including a wheelchair)?: A Little Help needed standing up from a chair using your arms (e.g., wheelchair or bedside chair)?: A Little Help needed to walk in hospital room?: Total Help needed climbing 3-5 steps with a railing? : Total 6 Click Score: 14    End of Session Equipment Utilized During Treatment: Gait belt Activity Tolerance: Patient tolerated treatment well Patient left: in chair;with call bell/phone within reach;with family/visitor present Nurse Communication:  Mobility status PT Visit Diagnosis: Other abnormalities of gait and mobility (R26.89);Muscle weakness (generalized) (M62.81);Difficulty in walking, not elsewhere classified (R26.2);Pain Pain - Right/Left: Left Pain - part of body: Leg    Time: 5284-1324 PT Time Calculation (min) (ACUTE ONLY): 32 min   Charges:                 Cecile Sheerer, SPT 09/25/22, 3:01 PM

## 2022-09-25 NOTE — Progress Notes (Signed)
Date of Admission:  09/18/2022      ID: Nancy [name] is a 57 y.o. female Principal Problem:   MSSA bacteremia Active Problems:   Essential hypertension   Asthma without status asthmaticus   Mixed hyperlipidemia   Takotsubo cardiomyopathy   Diabetes mellitus, type 2 (HCC)   Sepsis secondary to diabetic foot infection   Diabetic ulcer of left heel associated with type 2 diabetes mellitus (HCC)   Diabetic foot infection (HCC)   Osteomyelitis (HCC)   Cellulitis of left lower extremity   Gas gangrene (HCC)    Subjective: Doing better Says she was tearful this morning.  Feeling depressed  Medications:   amLODipine  5 mg Oral Daily   vitamin C  500 mg Oral BID   bisacodyl  10 mg Oral QHS   carvedilol  6.25 mg Oral BID   chlorhexidine  60 mL Topical Once   enoxaparin (LOVENOX) injection  60 mg Subcutaneous Q24H   gabapentin  100 mg Oral TID   insulin aspart  0-15 Units Subcutaneous TID WC   insulin aspart  0-5 Units Subcutaneous QHS   insulin aspart  8 Units Subcutaneous TID WC   [START ON 09/26/2022] insulin glargine-yfgn  35 Units Subcutaneous Daily   iron polysaccharides  150 mg Oral Daily   multivitamin with minerals  1 tablet Oral Daily   pantoprazole  40 mg Oral Daily   polyethylene glycol  17 g Oral BID   Ensure Max Protein  11 oz Oral BID    Objective: Vital signs in last 24 hours: Patient Vitals for the past 24 hrs:  BP Temp Pulse Resp SpO2 Weight  09/25/22 0814 (!) 169/82 98 F (36.7 C) 77 16 99 % --  09/25/22 0403 -- -- -- -- -- 124.6 kg  09/25/22 0323 (!) 124/50 97.7 F (36.5 C) 70 18 97 % --  09/24/22 2354 (!) 141/47 98.5 F (36.9 C) 76 18 100 % --  09/24/22 2040 (!) 150/61 98.9 F (37.2 C) 80 18 100 % --  09/24/22 1518 (!) 156/69 97.8 F (36.6 C) 85 14 97 % --      PHYSICAL EXAM:  General: Alert, cooperative, no distress, appears stated age.  Lungs: Clear to auscultation bilaterally. No Wheezing or Rhonchi. No rales. Heart: Regular rate  and rhythm, no murmur, rub or gallop. Abdomen: Soft, non-tender,not distended. Bowel sounds normal. No masses Extremities: left BKA Skin: No rashes or lesions. Or bruising Lymph: Cervical, supraclavicular normal. Neurologic: Grossly non-focal  Lab Results    Latest Ref Rng & Units 09/25/2022    2:36 AM 09/24/2022    3:42 AM 09/23/2022    2:37 AM  CBC  WBC 4.0 - 10.5 K/uL 17.1  13.6  13.9   Hemoglobin 12.0 - 15.0 g/dL 8.4  9.0  9.6   Hematocrit 36.0 - 46.0 % 27.2  28.0  29.6   Platelets 150 - 400 K/uL 570  557  589        Latest Ref Rng & Units 09/25/2022    2:36 AM 09/24/2022    3:42 AM 09/23/2022    2:37 AM  CMP  Glucose 70 - 99 mg/dL 454  098  119   BUN 6 - 20 mg/dL 20  20  23    Creatinine 0.44 - 1.00 mg/dL 1.47  8.29  5.62   Sodium 135 - 145 mmol/L 136  136  137   Potassium 3.5 - 5.1 mmol/L 5.0  4.5  4.6  Chloride 98 - 111 mmol/L 101  102  101   CO2 22 - 32 mmol/L 26  28  26    Calcium 8.9 - 10.3 mg/dL 8.5  8.3  8.6       Microbiology: [facility] MSSa 09/18/22 Repeat BC 09/19/22    Assessment/Plan:  MSSA bacteremia- source is the left heel ulcer/infection/of the foot Ischemic /gangrenous changes- underwent guillotine amputation on 9/12 Followed by BKA today 09/24/22 S/p BKA Currently on cefazolin  Need TEE to look for endocarditis Duration will be decided after that  Poorly controlled DM  Anemia  Discussed the management with patient and her partner at bedside

## 2022-09-25 NOTE — Progress Notes (Signed)
Inpatient Rehab Admissions Coordinator:    I spoke with pt. And her wife regarding potential CIR admit. They state understanding and state that family can work out a plan for 24/7 support at d/c. I will send case to insurance once PT note is in.   Megan Salon, MS, CCC-SLP Rehab Admissions Coordinator  [phone] (celll) [phone] (office)

## 2022-09-25 NOTE — Progress Notes (Signed)
Pt requested purewick to be placed for comfort. MD Mansy made aware. Will continue to monitor.

## 2022-09-26 ENCOUNTER — Inpatient Hospital Stay: Payer: 59 | Admitting: Anesthesiology

## 2022-09-26 ENCOUNTER — Encounter
Admission: EM | Disposition: A | Discharge status: Home Health | Payer: Self-pay | Source: Home / Self Care | Attending: Internal Medicine

## 2022-09-26 ENCOUNTER — Inpatient Hospital Stay
Admit: 2022-09-26 | Discharge: 2022-09-26 | Disposition: A | Discharge status: Home / Self Care | Payer: 59 | Attending: Student | Admitting: Student

## 2022-09-26 DIAGNOSIS — E11621 Type 2 diabetes mellitus with foot ulcer: Secondary | ICD-10-CM | POA: Diagnosis not present

## 2022-09-26 DIAGNOSIS — B9561 Methicillin susceptible Staphylococcus aureus infection as the cause of diseases classified elsewhere: Secondary | ICD-10-CM | POA: Diagnosis not present

## 2022-09-26 DIAGNOSIS — F4323 Adjustment disorder with mixed anxiety and depressed mood: Secondary | ICD-10-CM

## 2022-09-26 DIAGNOSIS — A480 Gas gangrene: Secondary | ICD-10-CM | POA: Diagnosis not present

## 2022-09-26 DIAGNOSIS — R7881 Bacteremia: Secondary | ICD-10-CM | POA: Diagnosis not present

## 2022-09-26 DIAGNOSIS — L97429 Non-pressure chronic ulcer of left heel and midfoot with unspecified severity: Secondary | ICD-10-CM | POA: Diagnosis not present

## 2022-09-26 HISTORY — PX: TEE WITHOUT CARDIOVERSION: SHX5443

## 2022-09-26 LAB — BASIC METABOLIC PANEL
Anion gap: 7 (ref 5–15)
BUN: 24 mg/dL — ABNORMAL HIGH (ref 6–20)
CO2: 28 mmol/L (ref 22–32)
Calcium: 8.1 mg/dL — ABNORMAL LOW (ref 8.9–10.3)
Chloride: 101 mmol/L (ref 98–111)
Creatinine, Ser: 0.89 mg/dL (ref 0.44–1.00)
GFR, Estimated: >60 mL/min (ref >60)
Glucose, Bld: 248 mg/dL — ABNORMAL HIGH (ref 70–99)
Potassium: 4.6 mmol/L (ref 3.5–5.1)
Sodium: 136 mmol/L (ref 135–145)

## 2022-09-26 LAB — CBC
HCT: 23.6 % — ABNORMAL LOW (ref 36.0–46.0)
Hemoglobin: 7.6 g/dL — ABNORMAL LOW (ref 12.0–15.0)
MCH: 29 pg (ref 26.0–34.0)
MCHC: 32.2 g/dL (ref 30.0–36.0)
MCV: 90.1 fL (ref 80.0–100.0)
Platelets: 514 10*3/uL — ABNORMAL HIGH (ref 150–400)
RBC: 2.62 MIL/uL — ABNORMAL LOW (ref 3.87–5.11)
RDW: 14.7 % (ref 11.5–15.5)
WBC: 12.3 10*3/uL — ABNORMAL HIGH (ref 4.0–10.5)
nRBC: 0 % (ref 0.0–0.2)

## 2022-09-26 LAB — GLUCOSE, CAPILLARY
Glucose-Capillary: 205 mg/dL — ABNORMAL HIGH (ref 70–99)
Glucose-Capillary: 149 mg/dL — ABNORMAL HIGH (ref 70–99)
Glucose-Capillary: 191 mg/dL — ABNORMAL HIGH (ref 70–99)
Glucose-Capillary: 226 mg/dL — ABNORMAL HIGH (ref 70–99)

## 2022-09-26 SURGERY — ECHOCARDIOGRAM, TRANSESOPHAGEAL
Anesthesia: General

## 2022-09-26 MED ORDER — VENLAFAXINE HCL ER 150 MG PO CP24
150.0000 mg | ORAL_CAPSULE | Freq: Every day | ORAL | Status: DC
  Administered 2022-09-26 – 2022-09-30 (×5): 150 mg via ORAL
  Filled 2022-09-26 (×5): qty 1

## 2022-09-26 MED ORDER — HYDROMORPHONE HCL 1 MG/ML IJ SOLN
0.5000 mg | INTRAMUSCULAR | Status: DC | PRN
  Administered 2022-09-28 – 2022-09-30 (×3): 0.5 mg via INTRAVENOUS
  Filled 2022-09-26 (×3): qty 0.5

## 2022-09-26 MED ORDER — HYDROMORPHONE HCL 2 MG PO TABS
2.0000 mg | ORAL_TABLET | ORAL | Status: DC | PRN
  Administered 2022-09-26 – 2022-09-30 (×13): 2 mg via ORAL
  Filled 2022-09-26 (×15): qty 1

## 2022-09-26 MED ORDER — LIDOCAINE HCL (CARDIAC) PF 100 MG/5ML IV SOSY
PREFILLED_SYRINGE | INTRAVENOUS | Status: AC
  Filled 2022-09-26: qty 5

## 2022-09-26 MED ORDER — SODIUM CHLORIDE 0.9 % IV SOLN: INTRAVENOUS | Status: DC

## 2022-09-26 MED ORDER — PROPOFOL 10 MG/ML IV BOLUS
INTRAVENOUS | Status: DC | PRN
Start: 2022-09-26 — End: 2022-09-26
  Administered 2022-09-26: 40 mg via INTRAVENOUS
  Administered 2022-09-26: 80 mg via INTRAVENOUS
  Administered 2022-09-26 (×2): 30 mg via INTRAVENOUS

## 2022-09-26 NOTE — CV Procedure (Signed)
TEE: Under moderate sedation, TEE was performed without complications: LV: Normal. Normal EF. RV: Normal LA: Normal. Left atrial appendage: Normal without thrombus. Normal function.  RA: Normal  MV: Normal Trace MR. No vegetation. TV: Normal Trace TR. No vegetation. AV: Normal. No AI or AS. No vegetation. PV: Normal. Trace PI. No vegetation.  Thoracic and ascending aorta: Normal without significant plaque or atheromatous changes.   Rozell Searing Abdul Beirne, DO 09/26/22 2:24 PM

## 2022-09-26 NOTE — Transfer of Care (Signed)
Immediate Anesthesia Transfer of Care Note  Patient: Nancy [name]  Procedure(s) Performed: TRANSESOPHAGEAL ECHOCARDIOGRAM  Patient Location: PACU  Anesthesia Type:General  Level of Consciousness: awake, alert , and oriented  Airway & Oxygen Therapy: Patient Spontanous Breathing  Post-op Assessment: Report given to RN  Post vital signs: Reviewed and stable  Last Vitals:  Vitals Value Taken Time  BP 133/65 All taken at 1358 pm   Temp  98.5 oral    Pulse 78   Resp 20   SpO2 98     Last Pain:  Vitals:   09/26/22 1211  TempSrc: Oral  PainSc: 6       Patients Stated Pain Goal: 2 (09/26/22 0649)  Complications: No notable events documented.

## 2022-09-26 NOTE — Progress Notes (Signed)
Progress Note   Patient: Nancy [name] QMV:[id number] DOB: 1965-12-04 DOA: 09/18/2022     8 DOS: the patient was seen and examined on 09/26/2022   Brief hospital course: Nancy [name] is a 57 y.o. female with medical history significant of type 2 diabetes, CAD, asthma, chronic pain, diabetic foot infection, GERD, hyperlipidemia, Takotsubo cardiomyopathy presented with worsening left foot pain over the past 2 to 3 days. In the ED, Tmax 101.2, heart rate 90s, respiration stable, blood pressure 160s to 170s over 60s to 70s. Satting well on room air. White count 20, hemoglobin 10.5, platelets 460, sed rate 80, creatinine 0.97 glucose 300s. Pancultured in the ER. Lactate 2.1. Left heel imaging with posterior heel ulcer with large amount of gas with the heel pad and posterior heel consistent with heel ulcer and necrotic infection. Positive osteomyelitic changes. Podiatry and vascular surgery evaluated her.  Patient is admitted to the hospitalist service for sepsis secondary to diabetic foot infection, osteomyelitis. 09/19/22 she had left ankle amputation with plan for BKA coming Tuesday.   Assessment and Plan: Sepsis secondary to diabetic foot infection Osteomyelitis (HCC) Sepsis resolved. Worsening L heel pain w/ gas gangrene secondary to posterior heel ulceration with osteomyelitis on imaging. Vascular surgery performed open guillotine amputation left foot 09/19/22 with vac placement She had Left BKA 09/24/22.  Continue Cefazolin per ID recommendations. Pain control.  Staphylococcus aureus Bacteremia Source - infected lt heel wound. Continue Ancef. repeat Blood cultures negative so far. TTE LVEF 60-65%, no wall motion abnormality.  No significant valvular abnormality, no vegetations, negative PFO TEE negative for endocarditis. She will need PICC line, OPAT evaluation for long term antibiotics.  Diabetes mellitus, type 2 (HCC) Poorly controlled Blood sugars 200s  Increased Semglee to  35units daily, pre-meal to 8 units TID. Continue accucheks, sliding-scale insulin.  Hyperkalemia- K 4.6 resume ACEI if K stable. Avoid nephrotoxic drugs.  Takotsubo cardiomyopathy Baseline history of transient Takotsubo cardiomyopathy Appears euvolemic at present No active chest pain.  Pseudohyponatremia: Due to her high blood sugars. Continue to trend sodium, correction of blood sugars.  Acute on chronic anemia: In the setting of sepsis, iron deficiency Hb dropped to 8.4 post amputation surgery. Monitor H/H, transfuse for Hb less than 7. Oral iron supplementation upon dc.  Mixed hyperlipidemia Stable Continue statin  Asthma without status asthmaticus Stable from respiratory standpoint Continue home inhalers  Essential hypertension BP stable Continue Coreg.  Morbid obesity with BMI 40.46: Contributing to her current condition. Diet, exercise and lifestyle modification advised. Weight reduction advised.     Subjective: Patient is seen and examined today morning, she is sitting on chair, pain better. Eating fair. Blood sugars in 200. No overnight issues. She had her TEE today.  Physical Exam: Vitals:   09/26/22 0436 09/26/22 0741 09/26/22 1211 09/26/22 1402  BP:  (!) 151/80 (!) 151/69 127/62  Pulse:  83 78 77  Resp:  16 13 12   Temp:  98.3 F (36.8 C) 98.9 F (37.2 C)   TempSrc:  Oral Oral   SpO2:  99% 98% 97%  Weight: 124.5 kg     Height:       General -middle aged obese Caucasian female, no apparent distress HEENT - PERRLA, EOMI, atraumatic head, non tender sinuses. Lung - Clear, rales, rhonchi, wheezes. Heart - S1, S2 heard, no murmurs, rubs, no pedal edema. Abdomen-soft, obese, nontender, bowel sounds good Neuro - Alert, awake and oriented x 3, non focal exam. Skin - Warm and dry.  Left BKA stump  with wound dressing.  Data Reviewed:     Latest Ref Rng & Units 09/26/2022    2:25 AM 09/25/2022    2:36 AM 09/24/2022    3:42 AM  CBC  WBC 4.0 - 10.5  K/uL 12.3  17.1  13.6   Hemoglobin 12.0 - 15.0 g/dL 7.6  8.4  9.0   Hematocrit 36.0 - 46.0 % 23.6  27.2  28.0   Platelets 150 - 400 K/uL 514  570  557        Latest Ref Rng & Units 09/26/2022    2:25 AM 09/25/2022    2:36 AM 09/24/2022    3:42 AM  BMP  Glucose 70 - 99 mg/dL 952  841  324   BUN 6 - 20 mg/dL 24  20  20    Creatinine 0.44 - 1.00 mg/dL 4.01  0.27  2.53   Sodium 135 - 145 mmol/L 136  136  136   Potassium 3.5 - 5.1 mmol/L 4.6  5.0  4.5   Chloride 98 - 111 mmol/L 101  101  102   CO2 22 - 32 mmol/L 28  26  28    Calcium 8.9 - 10.3 mg/dL 8.1  8.5  8.3    No results found.  Family Communication: Discussed with patient, her sister at bedside, they agree with current care plan  Disposition: Status is: Inpatient Remains inpatient appropriate because: post amputation surgery, IV antibiotic therapy, PICC line  Planned Discharge Destination: Skilled nursing facility   Time spent 42 min  Author: Marcelino Duster, MD 09/26/2022 2:17 PM  For on call review [email or web address].

## 2022-09-26 NOTE — CV Procedure (Signed)
No changes to H&P. TEE this afternoon.   Rozell Searing Elzy Tomasello, DO 8:51 AM 09/26/22

## 2022-09-26 NOTE — Anesthesia Postprocedure Evaluation (Signed)
Anesthesia Post Note  Patient: Nancy [name]  Procedure(s) Performed: TRANSESOPHAGEAL ECHOCARDIOGRAM  Patient location during evaluation: Endoscopy Anesthesia Type: General Level of consciousness: awake and alert Pain management: pain level controlled Vital Signs Assessment: post-procedure vital signs reviewed and stable Respiratory status: spontaneous breathing, nonlabored ventilation, respiratory function stable and patient connected to nasal cannula oxygen Cardiovascular status: blood pressure returned to baseline and stable Postop Assessment: no apparent nausea or vomiting Anesthetic complications: no   No notable events documented.   Last Vitals:  Vitals:   09/26/22 1410 09/26/22 1420  BP: 131/64 134/68  Pulse:    Resp:    Temp:    SpO2: 94% 95%    Last Pain:  Vitals:   09/26/22 1420  TempSrc:   PainSc: 0-No pain                 Louie Boston

## 2022-09-26 NOTE — Treatment Plan (Signed)
Diagnosis: MSSA bacteremia Left foot gangrene and infection s/p BKA   Allergies  Allergen Reactions   Shellfish Allergy Anaphylaxis   Codeine Other (See Comments)    Migraine  Other Reaction(s): Other (See Comments)  Reaction:  Severe migraines   Morphine And Codeine Other (See Comments)    Reaction:  Severe migraines    Percocet [Oxycodone-Acetaminophen] Nausea And Vomiting   Rosuvastatin     Other reaction(s): Muscle Pain   Sulfa Antibiotics Rash   Theophyllines Itching and Rash    OPAT Orders Discharge antibiotics: Cefazolin 2 grams IV every 8 hrs Duration: 4 weeks End Date: 10/16/22  [facility] Care Per Protocol:  Labs weekly while on IV antibiotics: _X_ CBC with differential  _X_ CMP  _X_ Please pull PIC at completion of IV antibiotics   Fax weekly lab results  promptly to [phone]  Clinic Follow Up Appt: 10/10/22 at 9.30 am   Call [phone] with any questions or concerns

## 2022-09-26 NOTE — Plan of Care (Signed)
  Problem: Education: Goal: Knowledge of General Education information will improve Description: Including pain rating scale, medication(s)/side effects and non-pharmacologic comfort measures Outcome: Progressing   Problem: Clinical Measurements: Goal: Signs and symptoms of infection will decrease Outcome: Progressing   Problem: Respiratory: Goal: Ability to maintain adequate ventilation will improve Outcome: Progressing   Problem: Clinical Measurements: Goal: Cardiovascular complication will be avoided Outcome: Progressing   Problem: Activity: Goal: Risk for activity intolerance will decrease Outcome: Progressing

## 2022-09-26 NOTE — Plan of Care (Signed)

## 2022-09-26 NOTE — Inpatient Diabetes Management (Signed)
Inpatient Diabetes Program Recommendations  AACE/ADA: New Consensus Statement on Inpatient Glycemic Control (2015)  Target Ranges:  Prepandial:   less than 140 mg/dL      Peak postprandial:   less than 180 mg/dL (1-2 hours)      Critically ill patients:  140 - 180 mg/dL   Lab Results  Component Value Date   GLUCAP 205 (H) 09/26/2022   HGBA1C 10.6 (H) 08/07/2022    Review of Glycemic Control  Latest Reference Range & Units 09/25/22 08:42 09/25/22 11:38 09/25/22 15:39 09/25/22 21:27 09/26/22 07:43  Glucose-Capillary 70 - 99 mg/dL 161 (H) 096 (H) 045 (H) 185 (H) 205 (H)  (H): Data is abnormally high  Diabetes history: DM2  Outpatient Diabetes medications:  70/30 50 units BID Metformin 1 gm QAM, 1.5 gm QPM Trulicity 4.5 mg weekly (not taking)  Current orders for Inpatient glycemic control:  Semglee 35 units every day, Novolog 0-15 units TID and 0-5 units at bedtime Novolog 8 units TID with meals  Inpatient Diabetes Program Recommendations:    Please consider increasing basal insulin:  Semglee 40 units QAM (could administer additional 5 units now as she has already received 35  units this morning).  Will continue to follow while inpatient.  Thank you, Dulce Sellar, MSN, CDCES Diabetes Coordinator Inpatient Diabetes Program [phone] (team pager from 8a-5p)

## 2022-09-26 NOTE — Consult Note (Addendum)
[facility] Face-to-Face Psychiatry Consult   Reason for Consult:  Psychiatric Evaluation Referring Physician:  Marcelino Duster MD Patient Identification: Nancy [name] MRN:  [id number] Principal Diagnosis: <principal problem not specified> Diagnosis:  Active Problems:   Adjustment disorder with mixed anxiety and depressed mood   Total Time spent with patient: 1 hour  Subjective:   Nancy [name] is a 57 y.o. female patient seen for psychiatric evaluation. Patient reports "I'm just having a hard time with losing my foot".   HPI:  Nancy [name] is a 57 y.o. female patient seen for psychiatric evaluation for depressed mood following a recent left below the knee amputation (BKA). Patient reports that she cries mostly in the morning and at night and is finding it difficult to cope with the changes of requiring assistance due to undergoing a L BKA. Patient's mother and wife are at bedside and patient reports them as her social support system. Patient reports that normally she manages her mood but feels as though her depressive symptoms have increased. She reports a history of panic attacks and anxiety. Patient also stated that she experienced depression 30 years ago after a "bad break-up" and participated in outpatient therapy. She reports being prescribed desvenlafaxine (Pristiq) by her PCP, which usually manages her symptoms. Patient reports remembering taking her medication last prior to admission, last Tuesday night. Patient informed that Pristiq is not listed on her current hospital medication list. This Clinical research associate consulted with patient's attending MD, Dr. Clide Dales, to coordinate care to ensure patient's hospital medication list matched with her home medications. Patient also recommended for outpatient therapy upon discharge to help cope with adjusting after a BKA. TOC consulted to assist with outpatient therapy referral upon discharge, as well as inpatient counseling.  Past Psychiatric  History: anxiety with panic attacks and depression, per patient.  Risk to Self:  denies Risk to Others: denies  Prior Inpatient Therapy:  denies Prior Outpatient Therapy: see above   Past Medical History:  Past Medical History:  Diagnosis Date   Abnormal cardiovascular stress test 09/21/2018   Formatting of this note might be different from the original. Lexiscan Myoview 09/16/2018 revealed mild anterior ischemia   Adverse effect of motion 05/10/2014   Anxiety    Arthritis    r knee   Asthma    Breast cyst 05/10/2014   CAD (coronary artery disease)    a.) LHC 06/04/2009 at [facility]; non-obstructive CAD. b.) CTA with FFR 10/08/2018: extensive mixed plaque proximal to mid LAD (51-69%); Coronary Ca score 217; FFR 0.71 dPDA, 0.86 mLAD, 0.87 dLCx.   CCF (congestive cardiac failure) (HCC) 06/06/2008   a.) 30% EF. b.) TTE 06/03/2011: EF >55%; triv MR. c.) TTE 04/27/2018: EF 55%; mild LVH; triv PR, mild MR/TR; G1DD.   Chest pain with high risk for cardiac etiology 07/02/2016   Chronic use of opiate drug for therapeutic purpose 02/13/2022   Formatting of this note might be different from the original. [facility] Contract signed on 04/18/16 & updated 03/04/17; UDS done on 04/18/16.   Complication of anesthesia    Diabetic foot infection (HCC) 03/13/2015   Diabetic foot ulcer (HCC) 02/08/2021   Eczema    Family history of adverse reaction to anesthesia    a.) PONV in mother and grandmother   GERD (gastroesophageal reflux disease) 05/10/2014   History of kidney stones    HLD (hyperlipidemia)    Hyponatremia 03/13/2015   Migraines    Motion sickness    Other acute osteomyelitis, left ankle and  foot (HCC) 02/13/2022   Panic attacks    Peripheral edema 04/27/2018   Pneumonia    PONV (postoperative nausea and vomiting)    Sepsis secondary to diabetic foot infection 03/13/2015   T2DM (type 2 diabetes mellitus) (HCC)    Takotsubo cardiomyopathy / transient apical balooning syndrome / stress-induced  cardiomyopathy 05/10/2014   Unspecified essential hypertension     Past Surgical History:  Procedure Laterality Date   AMPUTATION Left 09/19/2022   Procedure: AMPUTATION BELOW KNEE;  Surgeon: Renford Dills, MD;  Location: [facility];  Service: Vascular;  Laterality: Left;   AMPUTATION Left 09/24/2022   Procedure: AMPUTATION BELOW KNEE WITH WOUND CLOSURE;  Surgeon: Renford Dills, MD;  Location: [facility];  Service: Vascular;  Laterality: Left;   AMPUTATION TOE Left 03/16/2015   Procedure: left fifth toe amputation with incision and drainage;  Surgeon: Gwyneth Revels, DPM;  Location: [facility];  Service: Podiatry;  Laterality: Left;   APPLICATION OF WOUND VAC Left 02/14/2021   Procedure: APPLICATION OF WOUND VAC;  Surgeon: Gwyneth Revels, DPM;  Location: [facility];  Service: Podiatry;  Laterality: Left;   APPLICATION OF WOUND VAC Left 04/16/2021   Procedure: APPLICATION OF WOUND VAC;  Surgeon: Peggye Form, DO;  Location: [facility];  Service: Plastics;  Laterality: Left;   CARDIAC CATHETERIZATION Left 06/04/2009   Procedure: CARDIAC CATHETERIZATION; Location: [facility]   CHOLECYSTECTOMY     DEBRIDEMENT AND CLOSURE WOUND Left 04/16/2021   Procedure: DEBRIDEMENT AND CLOSURE WOUND;  Surgeon: Peggye Form, DO;  Location: [facility];  Service: Plastics;  Laterality: Left;  1 hour   IRRIGATION AND DEBRIDEMENT FOOT Left 02/08/2021   Procedure: IRRIGATION AND DEBRIDEMENT FOOT - LFT HEEL ULCER;  Surgeon: Gwyneth Revels, DPM;  Location: [facility];  Service: Podiatry;  Laterality: Left;   IRRIGATION AND DEBRIDEMENT FOOT Left 02/14/2021   Procedure: IRRIGATION AND DEBRIDEMENT LEFT HEEL;  Surgeon: Gwyneth Revels, DPM;  Location: [facility];  Service: Podiatry;  Laterality: Left;   kidney stone removal     KNEE ARTHROSCOPY W/ MENISCAL REPAIR     NOSE SURGERY  01/07/1989   due to fracture   TOE AMPUTATION     second toe   TONSILLECTOMY     TOTAL KNEE  ARTHROPLASTY  01/11/2011   Procedure: TOTAL KNEE ARTHROPLASTY;  Surgeon: Harvie Junior;  Location: [facility];  Service: Orthopedics;  Laterality: Right;  COMPUTER ASSISTED TOTAL KNEE REPLACEMENT   Family History:  Family History  Problem Relation Age of Onset   Transient ischemic attack Mother    Asthma Mother    Hypertension Mother    Lung cancer Father    Hypertension Father    Asthma Sister    Anxiety disorder Sister    Stomach cancer Maternal Grandmother    Colon cancer Maternal Grandmother    Leukemia Maternal Grandmother    Melanoma Maternal Grandfather    Asthma Sister    Hypertension Brother    Cancer Other    Crohn's disease Other    GER disease Other    Breast cancer Neg Hx    Family Psychiatric  History: see above Social History:  Social History   Substance and Sexual Activity  Alcohol Use No     Social History   Substance and Sexual Activity  Drug Use No    Social History   Socioeconomic History   Marital status: Married    Spouse name: Not on file   Number of  children: Not on file   Years of education: Not on file   Highest education level: Not on file  Occupational History   Not on file  Tobacco Use   Smoking status: Former    Current packs/day: 0.00    Types: Cigarettes    Quit date: 03/12/1985    Years since quitting: 37.5   Smokeless tobacco: Never  Vaping Use   Vaping status: Never Used  Substance and Sexual Activity   Alcohol use: No   Drug use: No   Sexual activity: Yes    Birth control/protection: Post-menopausal  Other Topics Concern   Not on file  Social History Narrative   Full time. Does not regularly exercise. Domestic partner.    Social Determinants of Health   Financial Resource Strain: Not on file  Food Insecurity: Not on file  Transportation Needs: Not on file  Physical Activity: Not on file  Stress: Not on file  Social Connections: Not on file   Additional Social History:    Allergies:   Allergies  Allergen  Reactions   Shellfish Allergy Anaphylaxis   Codeine Other (See Comments)    Migraine  Other Reaction(s): Other (See Comments)  Reaction:  Severe migraines   Morphine And Codeine Other (See Comments)    Reaction:  Severe migraines    Percocet [Oxycodone-Acetaminophen] Nausea And Vomiting   Rosuvastatin     Other reaction(s): Muscle Pain   Sulfa Antibiotics Rash   Theophyllines Itching and Rash    Labs:  Results for orders placed or performed during the hospital encounter of 09/18/22 (from the past 48 hour(s))  Glucose, capillary     Status: Abnormal   Collection Time: 09/24/22  9:28 PM  Result Value Ref Range   Glucose-Capillary 251 (H) 70 - 99 mg/dL    Comment: Glucose reference range applies only to samples taken after fasting for at least 8 hours.  Basic metabolic panel     Status: Abnormal   Collection Time: 09/25/22  2:36 AM  Result Value Ref Range   Sodium 136 135 - 145 mmol/L   Potassium 5.0 3.5 - 5.1 mmol/L   Chloride 101 98 - 111 mmol/L   CO2 26 22 - 32 mmol/L   Glucose, Bld 237 (H) 70 - 99 mg/dL    Comment: Glucose reference range applies only to samples taken after fasting for at least 8 hours.   BUN 20 6 - 20 mg/dL   Creatinine, Ser 4.09 0.44 - 1.00 mg/dL   Calcium 8.5 (L) 8.9 - 10.3 mg/dL   GFR, Estimated >81 >19 mL/min    Comment: (NOTE) Calculated using the CKD-EPI Creatinine Equation (2021)    Anion gap 9 5 - 15    Comment: Performed at [facility], [address] [place], [place] [address]  CBC     Status: Abnormal   Collection Time: 09/25/22  2:36 AM  Result Value Ref Range   WBC 17.1 (H) 4.0 - 10.5 K/uL   RBC 2.94 (L) 3.87 - 5.11 MIL/uL   Hemoglobin 8.4 (L) 12.0 - 15.0 g/dL   HCT 95.6 (L) 21.3 - 08.6 %   MCV 92.5 80.0 - 100.0 fL   MCH 28.6 26.0 - 34.0 pg   MCHC 30.9 30.0 - 36.0 g/dL   RDW 57.8 46.9 - 62.9 %   Platelets 570 (H) 150 - 400 K/uL   nRBC 0.0 0.0 - 0.2 %    Comment: Performed at [facility], [address]  [place], [place] [address]  Glucose, capillary     Status: Abnormal   Collection Time: 09/25/22  8:42 AM  Result Value Ref Range   Glucose-Capillary 210 (H) 70 - 99 mg/dL    Comment: Glucose reference range applies only to samples taken after fasting for at least 8 hours.  Glucose, capillary     Status: Abnormal   Collection Time: 09/25/22 11:38 AM  Result Value Ref Range   Glucose-Capillary 224 (H) 70 - 99 mg/dL    Comment: Glucose reference range applies only to samples taken after fasting for at least 8 hours.  Glucose, capillary     Status: Abnormal   Collection Time: 09/25/22  3:39 PM  Result Value Ref Range   Glucose-Capillary 198 (H) 70 - 99 mg/dL    Comment: Glucose reference range applies only to samples taken after fasting for at least 8 hours.  Glucose, capillary     Status: Abnormal   Collection Time: 09/25/22  9:27 PM  Result Value Ref Range   Glucose-Capillary 185 (H) 70 - 99 mg/dL    Comment: Glucose reference range applies only to samples taken after fasting for at least 8 hours.  Basic metabolic panel     Status: Abnormal   Collection Time: 09/26/22  2:25 AM  Result Value Ref Range   Sodium 136 135 - 145 mmol/L   Potassium 4.6 3.5 - 5.1 mmol/L   Chloride 101 98 - 111 mmol/L   CO2 28 22 - 32 mmol/L   Glucose, Bld 248 (H) 70 - 99 mg/dL    Comment: Glucose reference range applies only to samples taken after fasting for at least 8 hours.   BUN 24 (H) 6 - 20 mg/dL   Creatinine, Ser 6.04 0.44 - 1.00 mg/dL   Calcium 8.1 (L) 8.9 - 10.3 mg/dL   GFR, Estimated >54 >09 mL/min    Comment: (NOTE) Calculated using the CKD-EPI Creatinine Equation (2021)    Anion gap 7 5 - 15    Comment: Performed at [facility], [address] [place], [place] [address]  CBC     Status: Abnormal   Collection Time: 09/26/22  2:25 AM  Result Value Ref Range   WBC 12.3 (H) 4.0 - 10.5 K/uL   RBC 2.62 (L) 3.87 - 5.11 MIL/uL   Hemoglobin 7.6 (L) 12.0 - 15.0 g/dL   HCT 47.8 (L) 29.5  - 46.0 %   MCV 90.1 80.0 - 100.0 fL   MCH 29.0 26.0 - 34.0 pg   MCHC 32.2 30.0 - 36.0 g/dL   RDW 62.1 30.8 - 65.7 %   Platelets 514 (H) 150 - 400 K/uL   nRBC 0.0 0.0 - 0.2 %    Comment: Performed at [facility], [address] [place], [place] [address]  Glucose, capillary     Status: Abnormal   Collection Time: 09/26/22  7:43 AM  Result Value Ref Range   Glucose-Capillary 205 (H) 70 - 99 mg/dL    Comment: Glucose reference range applies only to samples taken after fasting for at least 8 hours.  Glucose, capillary     Status: Abnormal   Collection Time: 09/26/22 11:50 AM  Result Value Ref Range   Glucose-Capillary 149 (H) 70 - 99 mg/dL    Comment: Glucose reference range applies only to samples taken after fasting for at least 8 hours.  Glucose, capillary     Status: Abnormal   Collection Time: 09/26/22  3:53 PM  Result Value Ref  Range   Glucose-Capillary 191 (H) 70 - 99 mg/dL    Comment: Glucose reference range applies only to samples taken after fasting for at least 8 hours.    Current Facility-Administered Medications  Medication Dose Route Frequency Provider Last Rate Last Admin   0.9 %  sodium chloride infusion   Intravenous Continuous [place], [place], PA-C   New Bag at 09/26/22 1323   acetaminophen (TYLENOL) tablet 650 mg  650 mg Oral Q6H PRN Schnier, Latina Craver, MD   650 mg at 09/20/22 0200   Or   acetaminophen (TYLENOL) suppository 650 mg  650 mg Rectal Q6H PRN Schnier, Latina Craver, MD       ALPRAZolam Prudy Feeler) tablet 0.5 mg  0.5 mg Oral BID PRN Gilda Crease, Latina Craver, MD   0.5 mg at 09/26/22 0008   amLODipine (NORVASC) tablet 5 mg  5 mg Oral Daily Marcelino Duster, MD   5 mg at 09/26/22 1610   ascorbic acid (VITAMIN C) tablet 500 mg  500 mg Oral BID Renford Dills, MD   500 mg at 09/26/22 9604   bisacodyl (DULCOLAX) EC tablet 10 mg  10 mg Oral QHS Schnier, Latina Craver, MD   10 mg at 09/24/22 2212   bisacodyl (DULCOLAX) suppository 10 mg  10 mg Rectal Daily PRN  Renford Dills, MD       carvedilol (COREG) tablet 6.25 mg  6.25 mg Oral BID Renford Dills, MD   6.25 mg at 09/26/22 0926   ceFAZolin (ANCEF) IVPB 2g/100 mL premix  2 g Intravenous Q8H Schnier, Latina Craver, MD 200 mL/hr at 09/26/22 1504 2 g at 09/26/22 1504   chlorhexidine (HIBICLENS) 4 % liquid 4 Application  60 mL Topical Once Schnier, Latina Craver, MD       enoxaparin (LOVENOX) injection 60 mg  60 mg Subcutaneous Q24H Schnier, Latina Craver, MD   60 mg at 09/25/22 2144   gabapentin (NEURONTIN) capsule 100 mg  100 mg Oral TID Renford Dills, MD   100 mg at 09/26/22 1746   HYDROmorphone (DILAUDID) tablet 2 mg  2 mg Oral Q4H PRN Marcelino Duster, MD   2 mg at 09/26/22 1746   And   HYDROmorphone (DILAUDID) injection 0.5 mg  0.5 mg Intravenous Q4H PRN Marcelino Duster, MD       insulin aspart (novoLOG) injection 0-15 Units  0-15 Units Subcutaneous TID WC Schnier, Latina Craver, MD   3 Units at 09/26/22 1747   insulin aspart (novoLOG) injection 0-5 Units  0-5 Units Subcutaneous QHS Renford Dills, MD   3 Units at 09/24/22 2213   insulin aspart (novoLOG) injection 8 Units  8 Units Subcutaneous TID WC Schnier, Latina Craver, MD   8 Units at 09/26/22 1746   insulin glargine-yfgn (SEMGLEE) injection 35 Units  35 Units Subcutaneous Daily Marcelino Duster, MD   35 Units at 09/26/22 0925   iron polysaccharides (NIFEREX) capsule 150 mg  150 mg Oral Daily Schnier, Latina Craver, MD   150 mg at 09/26/22 5409   multivitamin with minerals tablet 1 tablet  1 tablet Oral Daily Schnier, Latina Craver, MD   1 tablet at 09/26/22 0926   naloxone (NARCAN) injection 0.4 mg  0.4 mg Intravenous PRN Schnier, Latina Craver, MD       nutrition supplement (JUVEN) (JUVEN) powder packet 1 packet  1 packet Oral BID BM Marcelino Duster, MD   1 packet at 09/26/22 1504   ondansetron (ZOFRAN) injection 4 mg  4 mg Intravenous Q6H PRN  Schnier, Latina Craver, MD   4 mg at 09/23/22 6578   pantoprazole (PROTONIX) EC tablet 40 mg  40  mg Oral Daily Schnier, Latina Craver, MD   40 mg at 09/26/22 0926   polyethylene glycol (MIRALAX / GLYCOLAX) packet 17 g  17 g Oral BID Schnier, Latina Craver, MD   17 g at 09/22/22 1200   protein supplement (ENSURE MAX) liquid  11 oz Oral BID Marcelino Duster, MD   11 oz at 09/25/22 2148   venlafaxine XR (EFFEXOR-XR) 24 hr capsule 150 mg  150 mg Oral Q breakfast Marcelino Duster, MD   150 mg at 09/26/22 1505   zinc sulfate capsule 220 mg  220 mg Oral Daily Marcelino Duster, MD   220 mg at 09/26/22 4696    Musculoskeletal: Strength & Muscle Tone:  n/a Gait & Station:  n/a Patient leans: N/A            Psychiatric Specialty Exam:  Presentation  General Appearance: Appropriate for Environment  Eye Contact:Good  Speech:Clear and Coherent  Speech Volume:Normal  Handedness:No data recorded  Mood and Affect  Mood:Depressed  Affect:Tearful   Thought Process  Thought Processes:Coherent  Descriptions of Associations:Intact  Orientation:Full (Time, Place and Person)  Thought Content:Logical  History of Schizophrenia/Schizoaffective disorder:No data recorded Duration of Psychotic Symptoms:No data recorded Hallucinations:Hallucinations: None  Ideas of Reference:None  Suicidal Thoughts:Suicidal Thoughts: No  Homicidal Thoughts:Homicidal Thoughts: No   Sensorium  Memory:Immediate Good; Recent Good; Remote Good  Judgment:Good  Insight:Good   Executive Functions  Concentration:Good  Attention Span:Good  Recall:Good  Fund of Knowledge:Good  Language:Good   Psychomotor Activity  Psychomotor Activity:Psychomotor Activity: Normal   Assets  Assets:Communication Skills; Desire for Improvement; Housing; Social Support   Sleep  Sleep:No data recorded  Physical Exam: Physical Exam Vitals and nursing note reviewed.  Neurological:     Mental Status: She is oriented to person, place, and time.    Review of Systems  Psychiatric/Behavioral:   Positive for depression.   All other systems reviewed and are negative.  Blood pressure 129/63, pulse 79, temperature 98.5 F (36.9 C), resp. rate 17, height 5\' 9"  (1.753 m), weight 124.5 kg, last menstrual period 02/08/2013, SpO2 97%. Body mass index is 40.53 kg/m.  Treatment Plan Summary: Plan Patient reports experiencing increased depressive symptoms, following BKA. This Clinical research associate reviewed home medication list and coordinated care with attending physician to ensure patient's medication list is accurate. Pristiq taken at home but not during inpatient stay. Attending MD opted to initiate venlafaxine since Pristiq is not on hospital formulary. TOC consulted for counseling/therapy recommendations/resources for inpatient and outpatient services.  Disposition: No evidence of imminent risk to self or others at present.   Patient does not meet criteria for psychiatric inpatient admission. Supportive therapy provided about ongoing stressors. Discussed crisis plan, support from social network, calling 911, coming to the Emergency Department, and calling Suicide Hotline.  Mcneil Sober, NP 09/26/2022 8:06 PM

## 2022-09-26 NOTE — Anesthesia Preprocedure Evaluation (Addendum)
Anesthesia Evaluation  Patient identified by MRN, date of birth, ID band Patient awake    Reviewed: Allergy & Precautions, NPO status , Patient's Chart, lab work & pertinent test results  History of Anesthesia Complications (+) PONV and history of anesthetic complications  Airway Mallampati: IV  TM Distance: >3 FB Neck ROM: full    Dental  (+) Teeth Intact   Pulmonary asthma , former smoker   Pulmonary exam normal breath sounds clear to auscultation       Cardiovascular hypertension, On Medications and On Home Beta Blockers (-) angina + CAD, + Peripheral Vascular Disease, +CHF and + Orthopnea  Normal cardiovascular exam Rhythm:Regular Rate:Normal - Systolic murmurs Takotsubo Cardiomyopathy   Neuro/Psych  Headaches PSYCHIATRIC DISORDERS Anxiety Depression       GI/Hepatic Neg liver ROS,GERD  Medicated,,  Endo/Other  diabetes, Poorly Controlled, Type 2, Insulin Dependent  Morbid obesity  Renal/GU      Musculoskeletal  (+) Arthritis ,    Abdominal  (+) + obese  Peds  Hematology negative hematology ROS (+)   Anesthesia Other Findings Pt S/P L BKA 09/24/2022 with MSSA bacteremia.  Past Medical History: 09/21/2018: Abnormal cardiovascular stress test     Comment:  Formatting of this note might be different from the               original. Lexiscan Myoview 09/16/2018 revealed mild               anterior ischemia 05/10/2014: Adverse effect of motion No date: Anxiety No date: Arthritis     Comment:  r knee No date: Asthma 05/10/2014: Breast cyst No date: CAD (coronary artery disease)     Comment:  a.) LHC 06/04/2009 at [facility]; non-obstructive CAD. b.) CTA               with FFR 10/08/2018: extensive mixed plaque proximal to               mid LAD (51-69%); Coronary Ca score 217; FFR 0.71 dPDA,               0.86 mLAD, 0.87 dLCx. 06/06/2008: CCF (congestive cardiac failure) (HCC)     Comment:  a.) 30% EF. b.) TTE  06/03/2011: EF >55%; triv MR. c.)               TTE 04/27/2018: EF 55%; mild LVH; triv PR, mild MR/TR;               G1DD. 07/02/2016: Chest pain with high risk for cardiac etiology 02/13/2022: Chronic use of opiate drug for therapeutic purpose     Comment:  Formatting of this note might be different from the               original. [facility] signed on 04/18/16 & updated               03/04/17; UDS done on 04/18/16. No date: Complication of anesthesia 03/13/2015: Diabetic foot infection (HCC) 02/08/2021: Diabetic foot ulcer (HCC) No date: Eczema No date: Family history of adverse reaction to anesthesia     Comment:  a.) PONV in mother and grandmother 05/10/2014: GERD (gastroesophageal reflux disease) No date: History of kidney stones No date: HLD (hyperlipidemia) 03/13/2015: Hyponatremia No date: Migraines No date: Motion sickness 02/13/2022: Other acute osteomyelitis, left ankle and foot (HCC) No date: Panic attacks 04/27/2018: Peripheral edema No date: Pneumonia No date: PONV (postoperative nausea and vomiting) 03/13/2015: Sepsis secondary to diabetic foot infection No date:  T2DM (type 2 diabetes mellitus) (HCC) 05/10/2014: Takotsubo cardiomyopathy / transient apical balooning  syndrome / stress-induced cardiomyopathy No date: Unspecified essential hypertension  Past Surgical History: 03/16/2015: AMPUTATION TOE; Left     Comment:  Procedure: left fifth toe amputation with incision and               drainage;  Surgeon: Gwyneth Revels, DPM;  Location: [facility]               ORS;  Service: Podiatry;  Laterality: Left; 02/14/2021: APPLICATION OF WOUND VAC; Left     Comment:  Procedure: APPLICATION OF WOUND VAC;  Surgeon: Gwyneth Revels, DPM;  Location: [facility] ORS;  Service: Podiatry;                Laterality: Left; 04/16/2021: APPLICATION OF WOUND VAC; Left     Comment:  Procedure: APPLICATION OF WOUND VAC;  Surgeon:               Peggye Form, DO;  Location:  [facility];  Service: Plastics;  Laterality: Left; 06/04/2009: CARDIAC CATHETERIZATION; Left     Comment:  Procedure: CARDIAC CATHETERIZATION; Location: [facility] No date: CHOLECYSTECTOMY 04/16/2021: DEBRIDEMENT AND CLOSURE WOUND; Left     Comment:  Procedure: DEBRIDEMENT AND CLOSURE WOUND;  Surgeon:               Peggye Form, DO;  Location: [facility];  Service: Plastics;  Laterality: Left;  1 hour 02/08/2021: IRRIGATION AND DEBRIDEMENT FOOT; Left     Comment:  Procedure: IRRIGATION AND DEBRIDEMENT FOOT - LFT HEEL               ULCER;  Surgeon: Gwyneth Revels, DPM;  Location: [facility]               ORS;  Service: Podiatry;  Laterality: Left; 02/14/2021: IRRIGATION AND DEBRIDEMENT FOOT; Left     Comment:  Procedure: IRRIGATION AND DEBRIDEMENT LEFT HEEL;                Surgeon: Gwyneth Revels, DPM;  Location: [facility] ORS;                Service: Podiatry;  Laterality: Left; No date: kidney stone removal No date: KNEE ARTHROSCOPY W/ MENISCAL REPAIR 01/07/1989: NOSE SURGERY     Comment:  due to fracture No date: TOE AMPUTATION     Comment:  second toe No date: TONSILLECTOMY 01/11/2011: TOTAL KNEE ARTHROPLASTY     Comment:  Procedure: TOTAL KNEE ARTHROPLASTY;  Surgeon: Harvie Junior;  Location: [facility] OR;  Service: Orthopedics;                Laterality: Right;  COMPUTER ASSISTED TOTAL KNEE               REPLACEMENT  BMI    Body Mass Index: 40.46 kg/m      Reproductive/Obstetrics negative OB ROS  Anesthesia Physical Anesthesia Plan  ASA: 3  Anesthesia Plan: General   Post-op Pain Management: Minimal or no pain anticipated   Induction: Intravenous  PONV Risk Score and Plan: 4 or greater and Propofol infusion, TIVA and Treatment may vary due to age or medical condition  Airway Management Planned: Natural Airway  Additional  Equipment:   Intra-op Plan:   Post-operative Plan:   Informed Consent: I have reviewed the patients History and Physical, chart, labs and discussed the procedure including the risks, benefits and alternatives for the proposed anesthesia with the patient or authorized representative who has indicated his/her understanding and acceptance.       Plan Discussed with: Anesthesiologist, CRNA and Surgeon  Anesthesia Plan Comments:         Anesthesia Quick Evaluation

## 2022-09-26 NOTE — Progress Notes (Signed)
Date of Admission:  09/18/2022      ID: Nancy [name] is a 57 y.o. female Principal Problem:   MSSA bacteremia Active Problems:   Essential hypertension   Asthma without status asthmaticus   Mixed hyperlipidemia   Takotsubo cardiomyopathy   Diabetes mellitus, type 2 (HCC)   Sepsis secondary to diabetic foot infection   Diabetic ulcer of left heel associated with type 2 diabetes mellitus (HCC)   Diabetic foot infection (HCC)   Osteomyelitis (HCC)   Cellulitis of left lower extremity   Gas gangrene (HCC)    Subjective: Doing better TEE neg  Medications:   amLODipine  5 mg Oral Daily   vitamin C  500 mg Oral BID   bisacodyl  10 mg Oral QHS   carvedilol  6.25 mg Oral BID   chlorhexidine  60 mL Topical Once   enoxaparin (LOVENOX) injection  60 mg Subcutaneous Q24H   gabapentin  100 mg Oral TID   insulin aspart  0-15 Units Subcutaneous TID WC   insulin aspart  0-5 Units Subcutaneous QHS   insulin aspart  8 Units Subcutaneous TID WC   insulin glargine-yfgn  35 Units Subcutaneous Daily   iron polysaccharides  150 mg Oral Daily   multivitamin with minerals  1 tablet Oral Daily   nutrition supplement (JUVEN)  1 packet Oral BID BM   pantoprazole  40 mg Oral Daily   polyethylene glycol  17 g Oral BID   Ensure Max Protein  11 oz Oral BID   venlafaxine XR  150 mg Oral Q breakfast   zinc sulfate  220 mg Oral Daily    Objective: Vital signs in last 24 hours: Patient Vitals for the past 24 hrs:  BP Temp Temp src Pulse Resp SpO2 Weight  09/26/22 1434 136/69 -- -- 80 11 96 % --  09/26/22 1420 134/68 -- -- -- -- 95 % --  09/26/22 1410 131/64 -- -- -- -- 94 % --  09/26/22 1406 125/68 -- -- -- -- 97 % --  09/26/22 1402 127/62 -- -- 77 12 97 % --  09/26/22 1211 (!) 151/69 98.9 F (37.2 C) Oral 78 13 98 % --  09/26/22 0741 (!) 151/80 98.3 F (36.8 C) Oral 83 16 99 % --  09/26/22 0436 -- -- -- -- -- -- 124.5 kg  09/25/22 2141 (!) 143/57 98.2 F (36.8 C) -- 81 18 98 % --   09/25/22 1537 (!) 155/71 98.1 F (36.7 C) -- 83 15 100 % --      PHYSICAL EXAM:  General: Alert, cooperative, no distress, appears stated age.  Lungs: Clear to auscultation bilaterally. No Wheezing or Rhonchi. No rales. Heart: Regular rate and rhythm, no murmur, rub or gallop. Abdomen: Soft, non-tender,not distended. Bowel sounds normal. No masses Extremities: left BKA Skin: No rashes or lesions. Or bruising Lymph: Cervical, supraclavicular normal. Neurologic: Grossly non-focal  Lab Results    Latest Ref Rng & Units 09/26/2022    2:25 AM 09/25/2022    2:36 AM 09/24/2022    3:42 AM  CBC  WBC 4.0 - 10.5 K/uL 12.3  17.1  13.6   Hemoglobin 12.0 - 15.0 g/dL 7.6  8.4  9.0   Hematocrit 36.0 - 46.0 % 23.6  27.2  28.0   Platelets 150 - 400 K/uL 514  570  557        Latest Ref Rng & Units 09/26/2022    2:25 AM 09/25/2022    2:36 AM 09/24/2022  3:42 AM  CMP  Glucose 70 - 99 mg/dL 284  132  440   BUN 6 - 20 mg/dL 24  20  20    Creatinine 0.44 - 1.00 mg/dL 1.02  7.25  3.66   Sodium 135 - 145 mmol/L 136  136  136   Potassium 3.5 - 5.1 mmol/L 4.6  5.0  4.5   Chloride 98 - 111 mmol/L 101  101  102   CO2 22 - 32 mmol/L 28  26  28    Calcium 8.9 - 10.3 mg/dL 8.1  8.5  8.3       Microbiology: [facility] MSSa 09/18/22 Repeat BC 09/19/22-NG    Assessment/Plan:  MSSA bacteremia- source is the left heel ulcer/infection/of the foot Ischemic /gangrenous changes- underwent guillotine amputation on 9/12 Followed by BKA today 09/24/22 S/p BKA  TEE noendocarditis Currently on cefazolin Will need for a total of 4 weeks Until 10/16/22   Poorly controlled DM  Anemia  Discussed the management with patient and her mom and her partner at bedside

## 2022-09-26 NOTE — Progress Notes (Signed)
Physical Therapy Treatment Patient Details Name: Nancy [name] MRN: [id number] DOB: 1965-07-15 Today's Date: 09/26/2022   History of Present Illness Nancy [name] is a 57 y.o. female who presents with left leg gangrene. 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 PMH includes: anxiety, CAD, DMII, CCF. Pt is now S/P L BKA    PT Comments  Pt was pleasant and motivated to participate during the session and put forth good effort throughout. Pt continues to be mod I with bed mobility, and is progressing with transfer ability. She continues to be CGA for STS, progressed to perform from lowered bed surface today, cues provided for hand placement and set up. Once up pt able to take shuffling steps, and once static, able to use BUE for support and gently hop ~x10 times with RLE before sitting down. Pt will benefit from continued PT services upon discharge to safely address deficits listed in patient problem list for decreased caregiver assistance and eventual return to PLOF.    If plan is discharge home, recommend the following: Help with stairs or ramp for entrance;Assist for transportation;Assistance with cooking/housework;Two people to help with walking and/or transfers;Two people to help with bathing/dressing/bathroom   Can travel by private vehicle        Equipment Recommendations  None recommended by PT;Other (comment) (TBD by next venue of care; none needed if going home)    Recommendations for Other Services       Precautions / Restrictions Precautions Precautions: Fall Restrictions Weight Bearing Restrictions: Yes LLE Weight Bearing: Non weight bearing     Mobility  Bed Mobility Overal bed mobility: Modified Independent Bed Mobility: Supine to Sit     Supine to sit: Modified independent (Device/Increase time)     General bed mobility comments: able to adjust self in bed as needed     Transfers Overall transfer level: Needs assistance Equipment used: Rolling walker (2 wheels) (BRW) Transfers: Sit to/from Stand Sit to Stand: Contact guard assist, From elevated surface           General transfer comment: STS performed from EOB from lowered/lowest bed height, continues with CGA and cues for hand placement and forwards weight shift.    Ambulation/Gait Ambulation/Gait assistance: Contact guard assist Gait Distance (Feet): 3 Feet Assistive device: Rolling walker (2 wheels) Gait Pattern/deviations: Shuffle (hop in stacne)       General Gait Details: Pt able to take shuffling steap with greater ease compared to prior session, once in a static stand pt able to place support through BUE's with ease in order to safely hop ~ x10 repititons before sitting down.   Stairs             Wheelchair Mobility     Tilt Bed    Modified Rankin (Stroke Patients Only)       Balance Overall balance assessment: Needs assistance Sitting-balance support: Feet supported, No upper extremity supported Sitting balance-Leahy Scale: Normal     Standing balance support: During functional activity, Reliant on assistive device for balance, Bilateral upper extremity supported Standing balance-Leahy Scale: Fair Standing balance comment: mod reliance on BRW                            Cognition Arousal: Alert Behavior During Therapy: WFL for tasks assessed/performed Overall Cognitive Status: Within Functional Limits for tasks assessed  Exercises Total Joint Exercises Long Arc Quad: Strengthening, Left, AROM, Seated, 15 reps Other Exercises Other Exercises: Pt provided continuing education on improtance of healing process and resting LLE position to prevent flexion contracture and promote optimal residual limb health    General Comments        Pertinent Vitals/Pain Pain Assessment Pain Assessment:  Faces Faces Pain Scale: Hurts a little bit Pain Location: LLE Pain Descriptors / Indicators: Aching, Sore, Discomfort Pain Intervention(s): Monitored during session, Limited activity within patient's tolerance    Home Living                          Prior Function            PT Goals (current goals can now be found in the care plan section) Progress towards PT goals: Progressing toward goals    Frequency    7X/week      PT Plan      Co-evaluation              AM-PAC PT "6 Clicks" Mobility   Outcome Measure  Help needed turning from your back to your side while in a flat bed without using bedrails?: A Little Help needed moving from lying on your back to sitting on the side of a flat bed without using bedrails?: A Little Help needed moving to and from a bed to a chair (including a wheelchair)?: A Little Help needed standing up from a chair using your arms (e.g., wheelchair or bedside chair)?: A Little Help needed to walk in hospital room?: A Little Help needed climbing 3-5 steps with a railing? : A Little 6 Click Score: 18    End of Session Equipment Utilized During Treatment: Gait belt Activity Tolerance: Patient tolerated treatment well Patient left: in chair;with call bell/phone within reach Nurse Communication: Mobility status PT Visit Diagnosis: Other abnormalities of gait and mobility (R26.89);Muscle weakness (generalized) (M62.81);Difficulty in walking, not elsewhere classified (R26.2);Pain Pain - Right/Left: Left Pain - part of body: Leg     Time: 1610-9604 PT Time Calculation (min) (ACUTE ONLY): 29 min  Charges:                            Cecile Sheerer, SPT 09/26/22, 9:56 AM

## 2022-09-26 NOTE — Progress Notes (Signed)
OT Cancellation Note  Patient Details Name: Nancy [name] MRN: [id number] DOB: 19-Jan-1965   Cancelled Treatment:    Reason Eval/Treat Not Completed: Patient at procedure or test/ unavailable    Danelle Earthly, MS, OTR/L   [name] 09/26/2022, 1:26 PM

## 2022-09-27 ENCOUNTER — Other Ambulatory Visit: Payer: Self-pay

## 2022-09-27 ENCOUNTER — Encounter: Payer: Self-pay | Admitting: Internal Medicine

## 2022-09-27 DIAGNOSIS — S88112A Complete traumatic amputation at level between knee and ankle, left lower leg, initial encounter: Secondary | ICD-10-CM | POA: Diagnosis not present

## 2022-09-27 DIAGNOSIS — E11621 Type 2 diabetes mellitus with foot ulcer: Secondary | ICD-10-CM | POA: Diagnosis not present

## 2022-09-27 DIAGNOSIS — L97429 Non-pressure chronic ulcer of left heel and midfoot with unspecified severity: Secondary | ICD-10-CM | POA: Diagnosis not present

## 2022-09-27 DIAGNOSIS — R7881 Bacteremia: Secondary | ICD-10-CM | POA: Diagnosis not present

## 2022-09-27 DIAGNOSIS — A480 Gas gangrene: Secondary | ICD-10-CM | POA: Diagnosis not present

## 2022-09-27 LAB — CBC
HCT: 26.1 % — ABNORMAL LOW (ref 36.0–46.0)
Hemoglobin: 8.4 g/dL — ABNORMAL LOW (ref 12.0–15.0)
MCH: 29.3 pg (ref 26.0–34.0)
MCHC: 32.2 g/dL (ref 30.0–36.0)
MCV: 90.9 fL (ref 80.0–100.0)
Platelets: 530 10*3/uL — ABNORMAL HIGH (ref 150–400)
RBC: 2.87 MIL/uL — ABNORMAL LOW (ref 3.87–5.11)
RDW: 15.1 % (ref 11.5–15.5)
WBC: 10 10*3/uL (ref 4.0–10.5)
nRBC: 0 % (ref 0.0–0.2)

## 2022-09-27 LAB — GLUCOSE, CAPILLARY
Glucose-Capillary: 220 mg/dL — ABNORMAL HIGH (ref 70–99)
Glucose-Capillary: 180 mg/dL — ABNORMAL HIGH (ref 70–99)
Glucose-Capillary: 179 mg/dL — ABNORMAL HIGH (ref 70–99)
Glucose-Capillary: 106 mg/dL — ABNORMAL HIGH (ref 70–99)

## 2022-09-27 MED ORDER — GABAPENTIN 100 MG PO CAPS
200.0000 mg | ORAL_CAPSULE | Freq: Every day | ORAL | Status: DC
  Administered 2022-09-27 – 2022-09-28 (×2): 200 mg via ORAL
  Filled 2022-09-27 (×2): qty 2

## 2022-09-27 MED ORDER — GABAPENTIN 100 MG PO CAPS: 100.0000 mg | ORAL_CAPSULE | Freq: Every day | ORAL | Status: DC

## 2022-09-27 MED ORDER — SODIUM CHLORIDE 0.9% FLUSH: 10.0000 mL | INTRAVENOUS | Status: DC | PRN

## 2022-09-27 MED ORDER — SODIUM CHLORIDE 0.9% FLUSH
10.0000 mL | Freq: Two times a day (BID) | INTRAVENOUS | Status: DC
  Administered 2022-09-28 (×2): 10 mL
  Administered 2022-09-29: 20 mL
  Administered 2022-09-29 – 2022-09-30 (×2): 10 mL

## 2022-09-27 MED ORDER — CHLORHEXIDINE GLUCONATE CLOTH 2 % EX PADS
6.0000 | MEDICATED_PAD | Freq: Every day | CUTANEOUS | Status: DC
  Administered 2022-09-27 – 2022-09-30 (×4): 6 via TOPICAL

## 2022-09-27 MED ORDER — LISINOPRIL 5 MG PO TABS
5.0000 mg | ORAL_TABLET | Freq: Every day | ORAL | Status: DC
  Administered 2022-09-27 – 2022-09-29 (×3): 5 mg via ORAL
  Filled 2022-09-27 (×3): qty 1

## 2022-09-27 MED ORDER — GABAPENTIN 100 MG PO CAPS
100.0000 mg | ORAL_CAPSULE | Freq: Two times a day (BID) | ORAL | Status: DC
  Administered 2022-09-28 – 2022-09-29 (×3): 100 mg via ORAL
  Filled 2022-09-27 (×3): qty 1

## 2022-09-27 NOTE — Progress Notes (Signed)
Peripherally Inserted Central Catheter Placement  The IV Nurse has discussed with the patient and/or persons authorized to consent for the patient, the purpose of this procedure and the potential benefits and risks involved with this procedure.  The benefits include less needle sticks, lab draws from the catheter, and the patient may be discharged home with the catheter. Risks include, but not limited to, infection, bleeding, blood clot (thrombus formation), and puncture of an artery; nerve damage and irregular heartbeat and possibility to perform a PICC exchange if needed/ordered by physician.  Alternatives to this procedure were also discussed.  Bard Power PICC patient education guide, fact sheet on infection prevention and patient information card has been provided to patient /or left at bedside.    PICC Placement Documentation  PICC Single Lumen 09/27/22 Right Basilic 41 cm 0 cm (Active)  Indication for Insertion or Continuance of Line Home intravenous therapies (PICC only) 09/27/22 1915  Exposed Catheter (cm) 0 cm 09/27/22 1915  Site Assessment Clean, Dry, Intact 09/27/22 1915  Line Status Flushed;Saline locked;Blood return noted 09/27/22 1915  Dressing Type Transparent;Securing device 09/27/22 1915  Dressing Status Antimicrobial disc in place;Clean, Dry, Intact 09/27/22 1915  Dressing Intervention New dressing 09/27/22 1915  Dressing Change Due 10/04/22 09/27/22 1915       Curt Jews 09/27/2022, 7:43 PM

## 2022-09-27 NOTE — Consult Note (Signed)
Physical Medicine and Rehabilitation Consult Reason for Consult: Left BKA Referring Physician: Marcelino Duster, MD   HPI: Nancy [name] is a 57 y.o. female now POD #4 from a left below the knee amputation. She is coping well with her new diagnosis. She is hopeful to discharge home. Physical Medicine & Rehabilitation was consulted to assess candidacy for CIR.     ROS +left phantom limb pain Past Medical History:  Diagnosis Date   Abnormal cardiovascular stress test 09/21/2018   Formatting of this note might be different from the original. Lexiscan Myoview 09/16/2018 revealed mild anterior ischemia   Adverse effect of motion 05/10/2014   Anxiety    Arthritis    r knee   Asthma    Breast cyst 05/10/2014   CAD (coronary artery disease)    a.) LHC 06/04/2009 at [facility]; non-obstructive CAD. b.) CTA with FFR 10/08/2018: extensive mixed plaque proximal to mid LAD (51-69%); Coronary Ca score 217; FFR 0.71 dPDA, 0.86 mLAD, 0.87 dLCx.   CCF (congestive cardiac failure) (HCC) 06/06/2008   a.) 30% EF. b.) TTE 06/03/2011: EF >55%; triv MR. c.) TTE 04/27/2018: EF 55%; mild LVH; triv PR, mild MR/TR; G1DD.   Chest pain with high risk for cardiac etiology 07/02/2016   Chronic use of opiate drug for therapeutic purpose 02/13/2022   Formatting of this note might be different from the original. [facility] Pain Contract signed on 04/18/16 & updated 03/04/17; UDS done on 04/18/16.   Complication of anesthesia    Diabetic foot infection (HCC) 03/13/2015   Diabetic foot ulcer (HCC) 02/08/2021   Eczema    Family history of adverse reaction to anesthesia    a.) PONV in mother and grandmother   GERD (gastroesophageal reflux disease) 05/10/2014   History of kidney stones    HLD (hyperlipidemia)    Hyponatremia 03/13/2015   Migraines    Motion sickness    Other acute osteomyelitis, left ankle and foot (HCC) 02/13/2022   Panic attacks    Peripheral edema 04/27/2018   Pneumonia    PONV (postoperative  nausea and vomiting)    Sepsis secondary to diabetic foot infection 03/13/2015   T2DM (type 2 diabetes mellitus) (HCC)    Takotsubo cardiomyopathy / transient apical balooning syndrome / stress-induced cardiomyopathy 05/10/2014   Unspecified essential hypertension    Past Surgical History:  Procedure Laterality Date   AMPUTATION Left 09/19/2022   Procedure: AMPUTATION BELOW KNEE;  Surgeon: Renford Dills, MD;  Location: [facility];  Service: Vascular;  Laterality: Left;   AMPUTATION Left 09/24/2022   Procedure: AMPUTATION BELOW KNEE WITH WOUND CLOSURE;  Surgeon: Renford Dills, MD;  Location: [facility];  Service: Vascular;  Laterality: Left;   AMPUTATION TOE Left 03/16/2015   Procedure: left fifth toe amputation with incision and drainage;  Surgeon: Gwyneth Revels, DPM;  Location: [facility];  Service: Podiatry;  Laterality: Left;   APPLICATION OF WOUND VAC Left 02/14/2021   Procedure: APPLICATION OF WOUND VAC;  Surgeon: Gwyneth Revels, DPM;  Location: [facility];  Service: Podiatry;  Laterality: Left;   APPLICATION OF WOUND VAC Left 04/16/2021   Procedure: APPLICATION OF WOUND VAC;  Surgeon: Peggye Form, DO;  Location: [facility];  Service: Plastics;  Laterality: Left;   CARDIAC CATHETERIZATION Left 06/04/2009   Procedure: CARDIAC CATHETERIZATION; Location: [facility]   CHOLECYSTECTOMY     DEBRIDEMENT AND CLOSURE WOUND Left 04/16/2021   Procedure: DEBRIDEMENT AND CLOSURE WOUND;  Surgeon: Peggye Form, DO;  Location: [facility];  Service: Plastics;  Laterality: Left;  1 hour   IRRIGATION AND DEBRIDEMENT FOOT Left 02/08/2021   Procedure: IRRIGATION AND DEBRIDEMENT FOOT - LFT HEEL ULCER;  Surgeon: Gwyneth Revels, DPM;  Location: [facility];  Service: Podiatry;  Laterality: Left;   IRRIGATION AND DEBRIDEMENT FOOT Left 02/14/2021   Procedure: IRRIGATION AND DEBRIDEMENT LEFT HEEL;  Surgeon: Gwyneth Revels, DPM;  Location: [facility];  Service:  Podiatry;  Laterality: Left;   kidney stone removal     KNEE ARTHROSCOPY W/ MENISCAL REPAIR     NOSE SURGERY  01/07/1989   due to fracture   TEE WITHOUT CARDIOVERSION N/A 09/26/2022   Procedure: TRANSESOPHAGEAL ECHOCARDIOGRAM;  Surgeon: Clotilde Dieter, DO;  Location: [facility];  Service: Cardiovascular;  Laterality: N/A;   TOE AMPUTATION     second toe   TONSILLECTOMY     TOTAL KNEE ARTHROPLASTY  01/11/2011   Procedure: TOTAL KNEE ARTHROPLASTY;  Surgeon: Harvie Junior;  Location: [facility];  Service: Orthopedics;  Laterality: Right;  COMPUTER ASSISTED TOTAL KNEE REPLACEMENT   Family History  Problem Relation Age of Onset   Transient ischemic attack Mother    Asthma Mother    Hypertension Mother    Lung cancer Father    Hypertension Father    Asthma Sister    Anxiety disorder Sister    Stomach cancer Maternal Grandmother    Colon cancer Maternal Grandmother    Leukemia Maternal Grandmother    Melanoma Maternal Grandfather    Asthma Sister    Hypertension Brother    Cancer Other    Crohn's disease Other    GER disease Other    Breast cancer Neg Hx    Social History:  reports that she quit smoking about 37 years ago. Her smoking use included cigarettes. She has never used smokeless tobacco. She reports that she does not drink alcohol and does not use drugs. Allergies:  Allergies  Allergen Reactions   Shellfish Allergy Anaphylaxis   Codeine Other (See Comments)    Migraine  Other Reaction(s): Other (See Comments)  Reaction:  Severe migraines   Morphine And Codeine Other (See Comments)    Reaction:  Severe migraines    Percocet [Oxycodone-Acetaminophen] Nausea And Vomiting   Rosuvastatin     Other reaction(s): Muscle Pain   Sulfa Antibiotics Rash   Theophyllines Itching and Rash   Medications Prior to Admission  Medication Sig Dispense Refill   acidophilus (RISAQUAD) CAPS capsule Take 1 capsule by mouth daily.     albuterol (PROVENTIL HFA;VENTOLIN HFA) 108 (90 Base)  MCG/ACT inhaler Inhale 2 puffs into the lungs every 4 (four) hours as needed for wheezing or shortness of breath. Reported on 03/29/2015 1 Inhaler 3   albuterol (PROVENTIL) (2.5 MG/3ML) 0.083% nebulizer solution Take 2.5 mg by nebulization as needed.     ALPRAZolam (XANAX) 0.5 MG tablet TAKE 1/2 TO 1 TABLET BY MOUTH AS NEEDED FOR PANIC ATTACKS 30 tablet 3   ascorbic acid (VITAMIN C) 500 MG tablet Take 1 tablet (500 mg total) by mouth 2 (two) times daily. 60 tablet 0   aspirin EC 81 MG tablet Take 81 mg by mouth daily.     aspirin-acetaminophen-caffeine (EXCEDRIN MIGRAINE) 250-250-65 MG tablet Take 2 tablets by mouth every 6 (six) hours as needed for migraine.     carvedilol (COREG) 6.25 MG tablet TAKE 1 TABLET BY MOUTH TWICE A DAY 180 tablet 1   desvenlafaxine (PRISTIQ) 100 MG 24 hr tablet TAKE 1 TABLET  BY MOUTH EVERY DAY 90 tablet 1   fenofibrate 54 MG tablet TAKE 1 TABLET BY MOUTH EVERY DAY 90 tablet 3   ibuprofen (ADVIL,MOTRIN) 800 MG tablet Take 800 mg by mouth every 8 (eight) hours as needed (for migraines).      insulin NPH-regular Human (70-30) 100 UNIT/ML injection Inject 50 Units into the skin 2 (two) times daily with a meal.     lisinopril (ZESTRIL) 5 MG tablet TAKE 1 TABLET BY MOUTH EVERY DAY (Patient taking differently: Take 5 mg by mouth 2 (two) times daily.) 90 tablet 1   metFORMIN (GLUCOPHAGE) 500 MG tablet TAKE TWO TABLETS EACH MORNING. AND 3 TABLETS IN THE EVENING AS DIRECTED 150 tablet 5   montelukast (SINGULAIR) 10 MG tablet TAKE 1 TABLET BY MOUTH EVERYDAY AT BEDTIME 90 tablet 1   nystatin (MYCOSTATIN/NYSTOP) powder APPLY TOPICALLY TWICE DAILY AS NEEDED 30 g 3   ondansetron (ZOFRAN-ODT) 4 MG disintegrating tablet Take 4 mg by mouth every 8 (eight) hours as needed.     pantoprazole (PROTONIX) 40 MG tablet TAKE 1 TABLET BY MOUTH EVERY DAY 90 tablet 3   pravastatin (PRAVACHOL) 40 MG tablet TAKE 1 TABLET BY MOUTH EVERY DAY 30 tablet 5   traMADol (ULTRAM) 50 MG tablet Take 50 mg by  mouth every 6 (six) hours as needed (for migraines).     zinc sulfate 220 (50 Zn) MG capsule Take 1 capsule (220 mg total) by mouth daily. 30 capsule 0   Continuous Blood Gluc Sensor (FREESTYLE LIBRE 2 SENSOR) MISC APPLY ONE SENSOR EVERY 2 WEEKS.     Dulaglutide (TRULICITY) 3 MG/0.5ML SOPN INJECT 1 AUTO INJECTOR INTO SKIN ONCE WEEKLY. (Patient not taking: Reported on 09/18/2022) 2 mL 0   Dulaglutide (TRULICITY) 4.5 MG/0.5ML SOPN Inject 4.5 mg as directed once a week. (Patient not taking: Reported on 09/18/2022) 2 mL 3   fluconazole (DIFLUCAN) 100 MG tablet Take one tablet by mouth on day 1 and second tablet by mouth on day 4 (Patient not taking: Reported on 09/18/2022) 2 tablet 1   SANTYL 250 UNIT/GM ointment Apply topically daily. (Patient not taking: Reported on 09/18/2022)     scopolamine (TRANSDERM-SCOP) 1 MG/3DAYS Place 1 patch (1.5 mg total) onto the skin every 3 (three) days. (Patient not taking: Reported on 09/18/2022) 10 patch 3    Home: Home Living Family/patient expects to be discharged to:: Private residence Living Arrangements: Spouse/significant other Available Help at Discharge: Family Type of Home: House Home Access: Ramped entrance Home Layout: One level Bathroom Shower/Tub: Walk-in shower Home Equipment: Agricultural consultant (2 wheels), Shower seat, Grab bars - toilet, Grab bars - tub/shower, Hand held shower head, Rollator (4 wheels), Wheelchair - power  Functional History: Prior Function Prior Level of Function : Independent/Modified Independent Mobility Comments: Using [id number] for HH mobility. has an electric WC for community distances. Standing brief priods of time with UE support. ADLs Comments: Wife assists with wound care and IADL management, but pt otherwise MOD I for BADL management. Functional Status:  Mobility: Bed Mobility Overal bed mobility: Modified Independent Bed Mobility: Supine to Sit Supine to sit: Modified independent (Device/Increase time) Sit to supine:  Modified independent (Device/Increase time) General bed mobility comments: Pt sitting EOB at start/end of session Transfers Overall transfer level: Needs assistance Equipment used: Rolling walker (2 wheels) Transfers: Sit to/from Stand Sit to Stand: Contact guard assist, From elevated surface Bed to/from chair/wheelchair/BSC transfer type:: Stand pivot Stand pivot transfers: Min assist Squat pivot transfers: Mod assist, +2 physical  assistance  Lateral/Scoot Transfers: Contact guard assist General transfer comment: STS performed from EOB x5, continued with CGA and education on hand placement and forwards weight shift. Ambulation/Gait Ambulation/Gait assistance: Contact guard assist Gait Distance (Feet): 3 Feet Assistive device: Rolling walker (2 wheels) Gait Pattern/deviations: Shuffle (hop in stacne) General Gait Details: not attempted today    ADL: ADL Overall ADL's : Needs assistance/impaired General ADL Comments: MAX A don shoe sitting on BSC improved to SUPERVISION donning seated EOB with modified figure 4 position on bed. MIN A + RW don underwear and LB bathing - assist for dynamic balance in standing portion. SETUP seated grooming and UB bathing  Cognition: Cognition Overall Cognitive Status: Within Functional Limits for tasks assessed Orientation Level: Oriented X4 Cognition Arousal: Alert Behavior During Therapy: WFL for tasks assessed/performed Overall Cognitive Status: Within Functional Limits for tasks assessed General Comments: Awake/alert, conversational. Becomes anxious with mobility attempts and endorses significant fear of falling during session.  Blood pressure (!) 160/63, pulse 75, temperature 97.9 F (36.6 C), resp. rate 18, height 5\' 9"  (1.753 m), weight 122.1 kg, last menstrual period 02/08/2013, SpO2 98%. Physical Exam Gen: no distress, normal appearing HEENT: oral mucosa pink and moist, NCAT Cardio: Reg rate Chest: normal effort, normal rate of  breathing Abd: soft, non-distended Ext: no edema Psych: pleasant, normal affect Skin: L BKA Neuro: Alert and oriented x3  Results for orders placed or performed during the hospital encounter of 09/18/22 (from the past 24 hour(s))  Glucose, capillary     Status: Abnormal   Collection Time: 09/26/22  9:01 PM  Result Value Ref Range   Glucose-Capillary 226 (H) 70 - 99 mg/dL  CBC     Status: Abnormal   Collection Time: 09/27/22  4:56 AM  Result Value Ref Range   WBC 10.0 4.0 - 10.5 K/uL   RBC 2.87 (L) 3.87 - 5.11 MIL/uL   Hemoglobin 8.4 (L) 12.0 - 15.0 g/dL   HCT 16.1 (L) 09.6 - 04.5 %   MCV 90.9 80.0 - 100.0 fL   MCH 29.3 26.0 - 34.0 pg   MCHC 32.2 30.0 - 36.0 g/dL   RDW 40.9 81.1 - 91.4 %   Platelets 530 (H) 150 - 400 K/uL   nRBC 0.0 0.0 - 0.2 %  Glucose, capillary     Status: Abnormal   Collection Time: 09/27/22  8:39 AM  Result Value Ref Range   Glucose-Capillary 220 (H) 70 - 99 mg/dL  Glucose, capillary     Status: Abnormal   Collection Time: 09/27/22 12:03 PM  Result Value Ref Range   Glucose-Capillary 180 (H) 70 - 99 mg/dL   [place] EKG SITE RITE  Result Date: 09/27/2022 If Site Rite image not attached, placement could not be confirmed due to current cardiac rhythm.   Assessment/Plan: 1) Phantom limb pain: messaged attending regarding additional gabapentin 100mg  at night  2) Insomnia: Started additional 100mg  gabapentin at night  3) Daytime somnolence: discussed that if gabapentin makes her sleep during the day, at home she can take her dose later in the day if her pin is well controlled in the morning  4) Right foot peripheral neuropathy: will schedule for outpatient [facility].   I have personally performed a face to face diagnostic evaluation of this patient. Additionally, I have examined the patient's medical record including any pertinent labs and radiographic images. If the physician assistant has documented in this note, I have reviewed and edited or otherwise concur  with the physician assistant's documentation.  Thanks,  Horton Chin, MD 09/27/2022

## 2022-09-27 NOTE — Plan of Care (Signed)
  Problem: Education: Goal: Ability to describe self-care measures that may prevent or decrease complications (Diabetes Survival Skills Education) will improve Outcome: Progressing Goal: Individualized Educational Video(s) Outcome: Progressing   Problem: Coping: Goal: Ability to adjust to condition or change in health will improve Outcome: Progressing   Problem: Fluid Volume: Goal: Ability to maintain a balanced intake and output will improve Outcome: Progressing   Problem: Health Behavior/Discharge Planning: Goal: Ability to identify and utilize available resources and services will improve Outcome: Progressing Goal: Ability to manage health-related needs will improve Outcome: Progressing   Problem: Nutritional: Goal: Maintenance of adequate nutrition will improve Outcome: Progressing Goal: Progress toward achieving an optimal weight will improve Outcome: Progressing   Problem: Skin Integrity: Goal: Risk for impaired skin integrity will decrease Outcome: Progressing   Problem: Tissue Perfusion: Goal: Adequacy of tissue perfusion will improve Outcome: Progressing   Problem: Respiratory: Goal: Ability to maintain adequate ventilation will improve Outcome: Progressing   Problem: Education: Goal: Knowledge of General Education information will improve Description: Including pain rating scale, medication(s)/side effects and non-pharmacologic comfort measures Outcome: Progressing   Problem: Elimination: Goal: Will not experience complications related to bowel motility Outcome: Progressing Goal: Will not experience complications related to urinary retention Outcome: Progressing   Problem: Pain Managment: Goal: General experience of comfort will improve Outcome: Progressing

## 2022-09-27 NOTE — Progress Notes (Signed)
OT Cancellation Note  Patient Details Name: MARINDA JONCAS MRN: [id number] DOB: [birth year]   Cancelled Treatment:    Reason Eval/Treat Not Completed: Other (comment) (Pt unavailable with PT) On arrival to Pt was working with PT. OT will follow up at next available date.  Glenard Haring, OTS

## 2022-09-27 NOTE — Progress Notes (Signed)
Physical Therapy Treatment Patient Details Name: Nancy [name] MRN: [id number] DOB: 03-14-1965 Today's Date: 09/27/2022   History of Present Illness Nancy [name] is a 57 y.o. female who presents with left leg gangrene. 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 PMH includes: anxiety, CAD, DMII, CCF. Pt is now S/P L BKA    PT Comments   Patient suffers from R BKA which impairs her ability to perform daily activities like toileting, feeding, dressing, grooming, bathing in the home. A cane, walker, crutch will not resolve the patient's issue with performing activities of daily living. A lightweight wheelchair and cushion is required/recommended and will allow patient to safely perform daily activities. Patient can safely propel the wheelchair in the home or has a caregiver who can provide assistance.   Pt was pleasant and motivated to participate during the session and put forth good effort throughout. Pt continues to be Mod I for bed mobility, and CGA for transfers from elevated surface. Pt performed various resisted exercises for LLE sitting EOB, with pt given education on importance exercises in order to maintain functional ability of both LE's. Pt given education on directions for pt's wife to assist with exercises. Pt able to perform STS x5, with education on appropriate hand placement to maximize safety when performing. Pt will benefit from continued PT services upon discharge to safely address deficits listed in patient problem list for decreased caregiver assistance and eventual return to PLOF.      If plan is discharge home, recommend the following: Help with stairs or ramp for entrance;Assist for transportation;Assistance with cooking/housework;Two people to help with walking and/or transfers;Two people to help with bathing/dressing/bathroom   Can travel by private vehicle        Equipment  Recommendations  Other (comment);BSC/3in1;Wheelchair (measurements PT) (If going home: Bariatric BSC, manual wheelchair)    Recommendations for Other Services       Precautions / Restrictions Precautions Precautions: Fall Restrictions Weight Bearing Restrictions: Yes LLE Weight Bearing: Non weight bearing     Mobility  Bed Mobility Overal bed mobility: Modified Independent             General bed mobility comments: Pt sitting EOB at start/end of session    Transfers Overall transfer level: Needs assistance Equipment used: Rolling walker (2 wheels) Transfers: Sit to/from Stand Sit to Stand: Contact guard assist, From elevated surface           General transfer comment: STS performed from EOB x5, continued with CGA and education on hand placement and forwards weight shift.    Ambulation/Gait               General Gait Details: not attempted today   Stairs             Wheelchair Mobility     Tilt Bed    Modified Rankin (Stroke Patients Only)       Balance Overall balance assessment: Needs assistance Sitting-balance support: Feet supported, No upper extremity supported Sitting balance-Leahy Scale: Normal                                      Cognition Arousal: Alert Behavior During Therapy: WFL for tasks assessed/performed Overall Cognitive Status: Within Functional Limits for tasks assessed  Exercises Total Joint Exercises Quad Sets: Left, 10 reps, AROM, Strengthening Long Arc Quad: Strengthening, Left, AROM, Seated, 10 reps (resisted) Knee Flexion: Seated, AROM, Left, 10 reps, Strengthening (resisted) Other Exercises Other Exercises: education on continuaiton of seated and bed exercises, pt verbalizing understanding. educated pt on wife role of assist when preforming transfers and for exercises.    General Comments        Pertinent Vitals/Pain Pain  Assessment Pain Assessment: Faces Faces Pain Scale: No hurt    Home Living                          Prior Function            PT Goals (current goals can now be found in the care plan section) Progress towards PT goals: Progressing toward goals    Frequency    7X/week      PT Plan      Co-evaluation              AM-PAC PT "6 Clicks" Mobility   Outcome Measure  Help needed turning from your back to your side while in a flat bed without using bedrails?: A Little Help needed moving from lying on your back to sitting on the side of a flat bed without using bedrails?: A Little Help needed moving to and from a bed to a chair (including a wheelchair)?: A Little Help needed standing up from a chair using your arms (e.g., wheelchair or bedside chair)?: A Little Help needed to walk in hospital room?: A Little Help needed climbing 3-5 steps with a railing? : A Little 6 Click Score: 18    End of Session Equipment Utilized During Treatment: Gait belt Activity Tolerance: Patient tolerated treatment well Patient left: with call bell/phone within reach;with family/visitor present;in bed Nurse Communication: Mobility status PT Visit Diagnosis: Other abnormalities of gait and mobility (R26.89);Muscle weakness (generalized) (M62.81);Difficulty in walking, not elsewhere classified (R26.2);Pain Pain - Right/Left: Left Pain - part of body: Leg     Time: 2130-8657 PT Time Calculation (min) (ACUTE ONLY): 31 min  Charges:                            Cecile Sheerer, SPT 09/27/22, 4:35 PM

## 2022-09-27 NOTE — Progress Notes (Signed)
Inpatient Rehab Admissions Coordinator:  Insurance authorization received. Await bed availability. Will continue to follow.   Wolfgang Phoenix, MS, CCC-SLP Admissions Coordinator [phone]

## 2022-09-27 NOTE — Progress Notes (Signed)
Progress Note    09/27/2022 1:10 PM 1 Day Post-Op  Subjective:   Nancy [name] is a 57 yo female now POD #4 left below the knee amputation. Patient is resting comfortably in bed this morning.  She does endorse some pain to the lower extremity but not as bad as preamputation.  No other complaints overnight. Vitals all remained stable.    Vitals:   09/27/22 0032 09/27/22 0757  BP: (!) 144/71 (!) 160/63  Pulse: 80 75  Resp: 18 18  Temp: 98.7 F (37.1 C) 97.9 F (36.6 C)  SpO2: 99% 98%   Physical Exam: Cardiac:  RRR, normal S1 and S2.  No rubs or gallops or murmurs. Lungs: Clear on auscultation throughout.  Normal nonlabored breathing.  No rales, rhonchi or wheezing. Incisions: Left lower extremity below the knee amputation. Dressing clean dry and intact.  Extremities: Left lower extremity below knee amputation.  Right lower extremity with palpable pulses. Abdomen: Positive bowel sounds throughout, soft, nontender and nondistended. Neurologic: Alert and oriented x 4, follows commands and answers questions appropriately.  Tearful today post procedure.  CBC    Component Value Date/Time   WBC 10.0 09/27/2022 0456   RBC 2.87 (L) 09/27/2022 0456   HGB 8.4 (L) 09/27/2022 0456   HGB 12.3 08/07/2022 1210   HCT 26.1 (L) 09/27/2022 0456   HCT 38.6 08/07/2022 1210   PLT 530 (H) 09/27/2022 0456   PLT 320 08/07/2022 1210   MCV 90.9 09/27/2022 0456   MCV 90 08/07/2022 1210   MCV 90 08/21/2012 1523   MCH 29.3 09/27/2022 0456   MCHC 32.2 09/27/2022 0456   RDW 15.1 09/27/2022 0456   RDW 11.8 08/07/2022 1210   RDW 13.4 08/21/2012 1523   LYMPHSABS 2.4 08/07/2022 1210   LYMPHSABS 2.7 06/05/2011 0550   MONOABS 0.9 02/13/2021 0323   MONOABS 0.4 06/05/2011 0550   EOSABS 0.5 (H) 08/07/2022 1210   EOSABS 0.6 06/05/2011 0550   BASOSABS 0.1 08/07/2022 1210   BASOSABS 0.1 06/05/2011 0550    BMET    Component Value Date/Time   NA 136 09/26/2022 0225   NA 132 (L) 08/07/2022 1210    NA 133 (L) 08/21/2012 1523   K 4.6 09/26/2022 0225   K 4.1 08/21/2012 1523   CL 101 09/26/2022 0225   CL 102 08/21/2012 1523   CO2 28 09/26/2022 0225   CO2 26 08/21/2012 1523   GLUCOSE 248 (H) 09/26/2022 0225   GLUCOSE 306 (H) 08/21/2012 1523   BUN 24 (H) 09/26/2022 0225   BUN 23 08/07/2022 1210   BUN 17 08/21/2012 1523   CREATININE 0.89 09/26/2022 0225   CREATININE 0.69 08/21/2012 1523   CALCIUM 8.1 (L) 09/26/2022 0225   CALCIUM 8.8 08/21/2012 1523   GFRNONAA >60 09/26/2022 0225   GFRNONAA >60 08/21/2012 1523   GFRAA 117 11/04/2016 1109   GFRAA >60 08/21/2012 1523    INR    Component Value Date/Time   INR 1.5 (H) 09/18/2022 1845     Intake/Output Summary (Last 24 hours) at 09/27/2022 1310 Last data filed at 09/26/2022 1342 Gross per 24 hour  Intake 300 ml  Output --  Net 300 ml     Assessment/Plan:  57 y.o. female is s/p left lower extremity below the knee amputation   1 Day Post-Op   PLAN: Continue PT/OT Pain medications PRN Migraine medications PRN OOB during the day. Dressing change this weekend.  Plan for Discharge on Monday. Patient refusing rehab and will be going home.  Has help at home and wants to save insurance paid days for rehab for prosthetic placement  DVT prophylaxis:  Lovenox 60 mg subcu every 24 hours.    Marcie [facility] 09/27/2022 1:10 PM

## 2022-09-27 NOTE — Progress Notes (Signed)
Progress Note   Patient: Nancy [name] HYQ:[id number] DOB: 03/26/1965 DOA: 09/18/2022     9 DOS: the patient was seen and examined on 09/27/2022   Brief hospital course: Nancy [name] is a 57 y.o. female with medical history significant of type 2 diabetes, CAD, asthma, chronic pain, diabetic foot infection, GERD, hyperlipidemia, Takotsubo cardiomyopathy presented with worsening left foot pain over the past 2 to 3 days. In the ED, Tmax 101.2, heart rate 90s, respiration stable, blood pressure 160s to 170s over 60s to 70s. Satting well on room air. White count 20, hemoglobin 10.5, platelets 460, sed rate 80, creatinine 0.97 glucose 300s. Pancultured in the ER. Lactate 2.1. Left heel imaging with posterior heel ulcer with large amount of gas with the heel pad and posterior heel consistent with heel ulcer and necrotic infection. Positive osteomyelitic changes. Podiatry and vascular surgery evaluated her.  Patient is admitted to the hospitalist service for sepsis secondary to diabetic foot infection, osteomyelitis. 09/19/22 she had left ankle amputation with plan for BKA coming Tuesday.   Assessment and Plan: Sepsis secondary to diabetic foot infection Osteomyelitis (HCC) Sepsis resolved. Worsening L heel pain w/ gas gangrene secondary to posterior heel ulceration with osteomyelitis on imaging. Vascular surgery performed open guillotine amputation left foot 09/19/22 with vac placement She had Left BKA 09/24/22. Continue Cefazolin 2 weeks per ID recommendations.  Staphylococcus aureus Bacteremia Source - infected lt heel wound. Continue Ancef. repeat Blood cultures negative so far. TTE LVEF 60-65%, no wall motion abnormality.  No significant valvular abnormality, no vegetations, negative PFO TEE negative for endocarditis. ID recommended 4 weeks of Cefazolin therapy. PICC line, OPAT evaluation for long term antibiotic orders placed. End date 10/16/22.  Diabetes mellitus, type 2 (HCC) Poorly  controlled Blood sugars 200s  Semglee 35units daily, pre-meal 8 units TID. Continue accucheks, sliding-scale insulin.  Hyperkalemia- K stable. resumed ACEI. Avoid nephrotoxic drugs.  Takotsubo cardiomyopathy Baseline history of transient Takotsubo cardiomyopathy Appears euvolemic at present No active chest pain.  Pseudohyponatremia: Due to her high blood sugars. No improved with blood sugars.  Acute on chronic anemia: In the setting of sepsis, iron deficiency Hb stable at 8.4. Monitor H/H, transfuse for Hb less than 7. Oral iron supplementation upon dc.  Mixed hyperlipidemia Stable Continue statin  Asthma without status asthmaticus Stable from respiratory standpoint Continue home inhalers  Essential hypertension BP stable Continue Coreg.  Morbid obesity with BMI 40.46: Contributing to her current condition. Diet, exercise and lifestyle modification advised. Weight reduction advised.     Subjective: Patient is seen and examined today morning, she is lying in bed, states she is able to get out of bed. Eating fair. Pain better. PICC line order placed for antibiotics.  Physical Exam: Vitals:   09/26/22 2125 09/27/22 0032 09/27/22 0500 09/27/22 0757  BP: (!) 160/65 (!) 144/71  (!) 160/63  Pulse: 72 80  75  Resp:  18  18  Temp:  98.7 F (37.1 C)  97.9 F (36.6 C)  TempSrc:      SpO2:  99%  98%  Weight:   122.1 kg   Height:       General -middle aged obese Caucasian female, no apparent distress HEENT - PERRLA, EOMI, atraumatic head, non tender sinuses. Lung - Clear, rales, rhonchi, wheezes. Heart - S1, S2 heard, no murmurs, rubs, no pedal edema. Abdomen-soft, obese, nontender, bowel sounds good Neuro - Alert, awake and oriented x 3, non focal exam. Skin - Warm and dry.  Left BKA stump  with wound dressing.  Data Reviewed:     Latest Ref Rng & Units 09/27/2022    4:56 AM 09/26/2022    2:25 AM 09/25/2022    2:36 AM  CBC  WBC 4.0 - 10.5 K/uL 10.0  12.3   17.1   Hemoglobin 12.0 - 15.0 g/dL 8.4  7.6  8.4   Hematocrit 36.0 - 46.0 % 26.1  23.6  27.2   Platelets 150 - 400 K/uL 530  514  570        Latest Ref Rng & Units 09/26/2022    2:25 AM 09/25/2022    2:36 AM 09/24/2022    3:42 AM  BMP  Glucose 70 - 99 mg/dL 413  244  010   BUN 6 - 20 mg/dL 24  20  20    Creatinine 0.44 - 1.00 mg/dL 2.72  5.36  6.44   Sodium 135 - 145 mmol/L 136  136  136   Potassium 3.5 - 5.1 mmol/L 4.6  5.0  4.5   Chloride 98 - 111 mmol/L 101  101  102   CO2 22 - 32 mmol/L 28  26  28    Calcium 8.9 - 10.3 mg/dL 8.1  8.5  8.3    [place] EKG SITE RITE  Result Date: 09/27/2022 If Site Rite image not attached, placement could not be confirmed due to current cardiac rhythm.   Family Communication: Discussed with patient, her sister at bedside, they agree with current care plan  Disposition: Status is: Inpatient Remains inpatient appropriate because: IV antibiotic therapy, PICC line  Planned Discharge Destination: Home with Home Health   Time spent 40 min  Author: Marcelino Duster, MD 09/27/2022 11:48 AM  For on call review [email or web address].

## 2022-09-27 NOTE — TOC Progression Note (Signed)
Transition of Care [facility]) - Progression Note    Patient Details  Name: Nancy [name] MRN: [id number] Date of Birth: 12-03-1965  Transition of Care [facility] CM/SW Contact  Marlowe Sax, RN Phone Number: 09/27/2022, 9:33 AM  Clinical Narrative:      The patient is to go to [facility], Ins pending  Expected Discharge Plan: Home w Home Health Services Barriers to Discharge: Continued Medical Work up  Expected Discharge Plan and Services   Discharge Planning Services: CM Consult   Living arrangements for the past 2 months: Single Family Home                 DME Arranged: N/A DME Agency: NA                   Social Determinants of Health (SDOH) Interventions SDOH Screenings   Depression (PHQ2-9): Low Risk  (05/31/2021)  Tobacco Use: Medium Risk (09/24/2022)    Readmission Risk Interventions     No data to display

## 2022-09-27 NOTE — Progress Notes (Signed)
PHARMACY CONSULT NOTE FOR:  OUTPATIENT  PARENTERAL ANTIBIOTIC THERAPY (OPAT)  Indication: MSSA bacteremia  Regimen: cefazolin 2g IV q8h End date: 10/16/2022  IV antibiotic discharge orders are pended. To discharging provider:  please sign these orders via discharge navigator,  Select New Orders & click on the button choice - Manage This Unsigned Work.     Thank you for allowing pharmacy to be a part of this patient's care.  Nancy [name] 09/27/2022, 8:58 AM

## 2022-09-27 NOTE — Consult Note (Signed)
The client was psych cleared yesterday after the PMHNP evaluated the client and coordinated care with TOC.  This provider saw her briefly as she was visiting with her mother and two others in her room.  She was smiling and talking amicably, denied needing anything and reported "I'm doing better."  Dr. Lynne Logan contacted via secure chat to see if anything else was needed, he needed "nothing for her".  Please reconsult psych as needed.  Nanine Means, PMHNP

## 2022-09-27 NOTE — Plan of Care (Signed)
Problem: Education: Goal: Ability to describe self-care measures that may prevent or decrease complications (Diabetes Survival Skills Education) will improve Outcome: Progressing   Problem: Coping: Goal: Ability to adjust to condition or change in health will improve Outcome: Progressing   Problem: Fluid Volume: Goal: Ability to maintain a balanced intake and output will improve Outcome: Progressing

## 2022-09-28 DIAGNOSIS — L97429 Non-pressure chronic ulcer of left heel and midfoot with unspecified severity: Secondary | ICD-10-CM | POA: Diagnosis not present

## 2022-09-28 DIAGNOSIS — A480 Gas gangrene: Secondary | ICD-10-CM | POA: Diagnosis not present

## 2022-09-28 DIAGNOSIS — E11621 Type 2 diabetes mellitus with foot ulcer: Secondary | ICD-10-CM | POA: Diagnosis not present

## 2022-09-28 DIAGNOSIS — R7881 Bacteremia: Secondary | ICD-10-CM | POA: Diagnosis not present

## 2022-09-28 LAB — GLUCOSE, CAPILLARY
Glucose-Capillary: 175 mg/dL — ABNORMAL HIGH (ref 70–99)
Glucose-Capillary: 182 mg/dL — ABNORMAL HIGH (ref 70–99)
Glucose-Capillary: 175 mg/dL — ABNORMAL HIGH (ref 70–99)
Glucose-Capillary: 185 mg/dL — ABNORMAL HIGH (ref 70–99)

## 2022-09-28 MED ORDER — METHOCARBAMOL 500 MG PO TABS
500.0000 mg | ORAL_TABLET | Freq: Three times a day (TID) | ORAL | Status: DC | PRN
  Administered 2022-09-28 – 2022-09-29 (×2): 500 mg via ORAL
  Filled 2022-09-28 (×3): qty 1

## 2022-09-28 NOTE — Progress Notes (Signed)
Physical Therapy Treatment Patient Details Name: Nancy [name] MRN: [id number] DOB: January 20, 1965 Today's Date: 09/28/2022   History of Present Illness Nancy [name] is a 57 y.o. female who presents with left leg gangrene. 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 PMH includes: anxiety, CAD, DMII, CCF. Pt is now S/P L BKA    PT Comments  Pt is progressing with LLE strengthening performing TherEx in sidelying AROM.  Pt is ModI with bed mobility and sitting balance EOB and CGA to MinA with transfers and short ambulation distance "hopping" to the recliner.  Pt reports continued weakness and instability in R LE when going to stand and with hopping.  Continued PT will assist pt towards greater safety and independence with mobility.    If plan is discharge home, recommend the following: Help with stairs or ramp for entrance;Assist for transportation;Assistance with cooking/housework;A lot of help with walking and/or transfers;A lot of help with bathing/dressing/bathroom   Can travel by private vehicle        Equipment Recommendations  Other (comment);BSC/3in1;Wheelchair (measurements PT) (If going home: Bariatric BSC, manual wheelchair)    Recommendations for Other Services       Precautions / Restrictions Precautions Precautions: Fall Restrictions Weight Bearing Restrictions: Yes LLE Weight Bearing: Non weight bearing     Mobility  Bed Mobility Overal bed mobility: Modified Independent Bed Mobility: Supine to Sit, Rolling Rolling: Modified independent (Device/Increase time)   Supine to sit: Modified independent (Device/Increase time)          Transfers Overall transfer level: Needs assistance Equipment used: Rolling walker (2 wheels) Transfers: Sit to/from Stand, Bed to chair/wheelchair/BSC Sit to Stand: Contact guard assist, From elevated surface, Min assist   Step pivot transfers:  Contact guard assist       General transfer comment: CGA to min A from lower surface to stabilize with sit<>stand.  Step-pivot, pt performed hops around to recliner and backed up to sit down, CGA.    Ambulation/Gait Ambulation/Gait assistance: Contact guard assist Gait Distance (Feet): 3 Feet Assistive device: Rolling walker (2 wheels)         General Gait Details: several hops from bed to recliner.   Stairs             Wheelchair Mobility     Tilt Bed    Modified Rankin (Stroke Patients Only)       Balance Overall balance assessment: Modified Independent Sitting-balance support: Feet supported, No upper extremity supported Sitting balance-Leahy Scale: Normal     Standing balance support: During functional activity, Reliant on assistive device for balance, Bilateral upper extremity supported Standing balance-Leahy Scale: Fair Standing balance comment: mod reliance on RW.                            Cognition Arousal: Alert Behavior During Therapy: WFL for tasks assessed/performed Overall Cognitive Status: Within Functional Limits for tasks assessed                                          Exercises Total Joint Exercises Hip ABduction/ADduction: AROM, Left, 10 reps, Supine, Sidelying Standing Hip Extension: AROM, Left, 20 reps, Sidelying    General Comments        Pertinent Vitals/Pain Pain Assessment Pain Score: 7  Pain Location: LLE  Pain Descriptors / Indicators: Aching, Sore, Discomfort Pain Intervention(s): Patient requesting pain meds-RN notified, Monitored during session    Home Living                          Prior Function            PT Goals (current goals can now be found in the care plan section) Acute Rehab PT Goals Patient Stated Goal: Improve mobility PT Goal Formulation: With patient/family Time For Goal Achievement: 10/08/22 Potential to Achieve Goals: Good Progress towards PT goals:  Progressing toward goals    Frequency    7X/week      PT Plan      Co-evaluation              AM-PAC PT "6 Clicks" Mobility   Outcome Measure  Help needed turning from your back to your side while in a flat bed without using bedrails?: None Help needed moving from lying on your back to sitting on the side of a flat bed without using bedrails?: A Little Help needed moving to and from a bed to a chair (including a wheelchair)?: A Little Help needed standing up from a chair using your arms (e.g., wheelchair or bedside chair)?: A Little Help needed to walk in hospital room?: A Little Help needed climbing 3-5 steps with a railing? : Total 6 Click Score: 17    End of Session Equipment Utilized During Treatment: Gait belt Activity Tolerance: Patient tolerated treatment well Patient left: with call bell/phone within reach;in chair Nurse Communication: Mobility status PT Visit Diagnosis: Other abnormalities of gait and mobility (R26.89);Muscle weakness (generalized) (M62.81);Difficulty in walking, not elsewhere classified (R26.2);Pain Pain - Right/Left: Left Pain - part of body: Leg     Time: 4782-9562 PT Time Calculation (min) (ACUTE ONLY): 32 min  Charges:    $Therapeutic Exercise: 8-22 mins $Therapeutic Activity: 8-22 mins PT General Charges $$ ACUTE PT VISIT: 1 Visit                     Nancy [name], PTA  09/28/22, 9:37 AM

## 2022-09-28 NOTE — Plan of Care (Signed)

## 2022-09-28 NOTE — Progress Notes (Signed)
Progress Note   Patient: Nancy [name]:[id number] DOB: 1965-07-02 DOA: 09/18/2022     10 DOS: the patient was seen and examined on 09/28/2022   Brief hospital course: MELVIA NOONER is a 57 y.o. female with medical history significant of type 2 diabetes, CAD, asthma, chronic pain, diabetic foot infection, GERD, hyperlipidemia, Takotsubo cardiomyopathy presented with worsening left foot pain over the past 2 to 3 days. In the ED, Tmax 101.2, heart rate 90s, respiration stable, blood pressure 160s to 170s over 60s to 70s. Satting well on room air. White count 20, hemoglobin 10.5, platelets 460, sed rate 80, creatinine 0.97 glucose 300s. Pancultured in the ER. Lactate 2.1. Left heel imaging with posterior heel ulcer with large amount of gas with the heel pad and posterior heel consistent with heel ulcer and necrotic infection. Positive osteomyelitic changes. Podiatry and vascular surgery evaluated her.  Patient is admitted to the hospitalist service for sepsis secondary to diabetic foot infection, osteomyelitis. 09/19/22 she had left ankle amputation with plan for BKA coming Tuesday.   Assessment and Plan: Sepsis secondary to diabetic foot infection Osteomyelitis (HCC) Sepsis resolved. Worsening L heel pain w/ gas gangrene secondary to posterior heel ulceration with osteomyelitis on imaging.  Vascular surgery performed open guillotine amputation left foot 09/19/22 with vac placement She had Left BKA 09/24/22.  Continue Cefazolin 2 weeks per ID recommendations. Patient got PICC line placement yesterday.  Staphylococcus aureus Bacteremia Source - infected lt heel wound. Continue Ancef. repeat Blood cultures negative so far. TTE LVEF 60-65%, no wall motion abnormality.  No significant valvular abnormality, no vegetations, negative PFO TEE negative for endocarditis. ID recommended 4 weeks of Cefazolin therapy. PICC line placed yesterday.  OPAT evaluation for long term antibiotic orders  placed. End date for cefazolin therapy 10/16/22.  Diabetes mellitus, type 2 (HCC) Blood pressure improved. Semglee 35units daily, pre-meal 8 units TID. Continue accucheks, sliding-scale insulin.  Hyperkalemia- K stable. resumed ACEI. Avoid nephrotoxic drugs.  Takotsubo cardiomyopathy Baseline history of transient Takotsubo cardiomyopathy Appears euvolemic at present No active chest pain.  Pseudohyponatremia: Due to her high blood sugars. Improved with blood sugars.  Acute on chronic anemia: Hb stable at 8.4. Monitor H/H, transfuse for Hb less than 7. Oral iron supplementation upon dc.  Mixed hyperlipidemia Stable Continue statin  Asthma without status asthmaticus Stable from respiratory standpoint Continue home inhalers  Essential hypertension BP stable Continue Coreg.  Morbid obesity with BMI 40.46: Contributing to her current condition. Diet, exercise and lifestyle modification advised. Weight reduction advised.     Subjective: Patient is seen and examined today morning, she is in chair, states her blood sugars improved. Eating poor. Pain better. PICC line placed yesterday.  No overnight issues.  Physical Exam: Vitals:   09/27/22 1640 09/27/22 2324 09/28/22 0500 09/28/22 0824  BP: (!) 154/76 (!) 142/58  (!) 141/61  Pulse: 71 74  72  Resp: 18 18  16   Temp: 98.4 F (36.9 C) 98.5 F (36.9 C)  98.4 F (36.9 C)  TempSrc:      SpO2: 100% 100%  100%  Weight:   124.4 kg   Height:       General -middle aged obese Caucasian female, no apparent distress HEENT - PERRLA, EOMI, atraumatic head, non tender sinuses. Lung - Clear, rales, rhonchi, wheezes. Heart - S1, S2 heard, no murmurs, rubs, no pedal edema. Abdomen-soft, obese, nontender, bowel sounds good Neuro - Alert, awake and oriented x 3, non focal exam. Skin - Warm and dry.  Left BKA stump with wound dressing.  Data Reviewed:     Latest Ref Rng & Units 09/27/2022    4:56 AM 09/26/2022    2:25 AM  09/25/2022    2:36 AM  CBC  WBC 4.0 - 10.5 K/uL 10.0  12.3  17.1   Hemoglobin 12.0 - 15.0 g/dL 8.4  7.6  8.4   Hematocrit 36.0 - 46.0 % 26.1  23.6  27.2   Platelets 150 - 400 K/uL 530  514  570        Latest Ref Rng & Units 09/26/2022    2:25 AM 09/25/2022    2:36 AM 09/24/2022    3:42 AM  BMP  Glucose 70 - 99 mg/dL 536  644  034   BUN 6 - 20 mg/dL 24  20  20    Creatinine 0.44 - 1.00 mg/dL 7.42  5.95  6.38   Sodium 135 - 145 mmol/L 136  136  136   Potassium 3.5 - 5.1 mmol/L 4.6  5.0  4.5   Chloride 98 - 111 mmol/L 101  101  102   CO2 22 - 32 mmol/L 28  26  28    Calcium 8.9 - 10.3 mg/dL 8.1  8.5  8.3    [place] EKG SITE RITE  Result Date: 09/27/2022 If Site Rite image not attached, placement could not be confirmed due to current cardiac rhythm.   Family Communication: Discussed with patient, her sister at bedside, they agree with current care plan  Disposition: Status is: Inpatient Remains inpatient appropriate because: IV antibiotic therapy, need home health, PT OT, dressing changes  Planned Discharge Destination: Home with Home Health   Time spent 38 min  Author: Marcelino Duster, MD 09/28/2022 3:17 PM  For on call review [email or web address].

## 2022-09-28 NOTE — Plan of Care (Signed)
  Problem: Coping: Goal: Ability to adjust to condition or change in health will improve Outcome: Adequate for Discharge   Problem: Fluid Volume: Goal: Ability to maintain a balanced intake and output will improve Outcome: Adequate for Discharge   Problem: Nutritional: Goal: Progress toward achieving an optimal weight will improve Outcome: Adequate for Discharge

## 2022-09-29 DIAGNOSIS — E11621 Type 2 diabetes mellitus with foot ulcer: Secondary | ICD-10-CM | POA: Diagnosis not present

## 2022-09-29 DIAGNOSIS — A480 Gas gangrene: Secondary | ICD-10-CM | POA: Diagnosis not present

## 2022-09-29 DIAGNOSIS — R7881 Bacteremia: Secondary | ICD-10-CM | POA: Diagnosis not present

## 2022-09-29 DIAGNOSIS — L97429 Non-pressure chronic ulcer of left heel and midfoot with unspecified severity: Secondary | ICD-10-CM | POA: Diagnosis not present

## 2022-09-29 LAB — GLUCOSE, CAPILLARY
Glucose-Capillary: 164 mg/dL — ABNORMAL HIGH (ref 70–99)
Glucose-Capillary: 197 mg/dL — ABNORMAL HIGH (ref 70–99)
Glucose-Capillary: 161 mg/dL — ABNORMAL HIGH (ref 70–99)
Glucose-Capillary: 172 mg/dL — ABNORMAL HIGH (ref 70–99)

## 2022-09-29 MED ORDER — GABAPENTIN 100 MG PO CAPS
100.0000 mg | ORAL_CAPSULE | Freq: Every day | ORAL | Status: DC
  Administered 2022-09-29: 100 mg via ORAL
  Filled 2022-09-29: qty 1

## 2022-09-29 NOTE — Progress Notes (Signed)
Physical Therapy Treatment Patient Details Name: Nancy [name] MRN: [id number] DOB: [birth year] Today's Date: 09/29/2022   History of Present Illness Nancy [name] is a 57 y.o. female who presents with left leg gangrene. 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 PMH includes: anxiety, CAD, DMII, CCF. Pt is now S/P L BKA    PT Comments  Pt ready for session.  Sitting EOB eating breakfast.  She stated she is planning to DC home vs rehab as her wife can be home with her for several weeks until she feels independent.  Session focused on functional transfers and mobility at wheelchair level.  Sliding board transfers x 5 with supervision and squat pivot transfers that she does still struggle with.  Problem solved different transfer and mobility situations for home.  She feels comfortable with plan and would like to proceed with DC home.   Pt will need bariatric RW, wide wheelchair, sliding board and drop arm bariatric BSC for transition home along with HHPT/OT.  Will discuss with team.   If plan is discharge home, recommend the following: Help with stairs or ramp for entrance;Assist for transportation;Assistance with cooking/housework;A little help with walking and/or transfers;A little help with bathing/dressing/bathroom   Can travel by private vehicle        Equipment Recommendations  Other (comment);BSC/3in1;Wheelchair (measurements PT) (If going home: Bariatric drop arm BSC, manual wheelchair, wide, sliding board)    Recommendations for Other Services       Precautions / Restrictions Precautions Precautions: Fall Restrictions Weight Bearing Restrictions: Yes LLE Weight Bearing: Non weight bearing Other Position/Activity Restrictions: wound vac on LLE     Mobility  Bed Mobility Overal bed mobility: Modified Independent               Patient Response: Cooperative  Transfers Overall  transfer level: Needs assistance Equipment used: Sliding board, None Transfers: Bed to chair/wheelchair/BSC            Lateral/Scoot Transfers: Supervision General transfer comment: set up and cues but no physical assist for transfer    Ambulation/Gait               General Gait Details: focused on functional transfers for transition home   Stairs             Wheelchair Mobility     Tilt Bed Tilt Bed Patient Response: Cooperative  Modified Rankin (Stroke Patients Only)       Balance Overall balance assessment: Needs assistance Sitting-balance support: Feet supported Sitting balance-Leahy Scale: Normal                                      Cognition Arousal: Alert Behavior During Therapy: WFL for tasks assessed/performed Overall Cognitive Status: Within Functional Limits for tasks assessed                                          Exercises      General Comments        Pertinent Vitals/Pain Pain Assessment Pain Assessment: Faces Faces Pain Scale: Hurts a little bit Pain Location: LLE Pain Descriptors / Indicators: Aching, Sore, Discomfort Pain Intervention(s): Monitored during session    Home Living  Prior Function            PT Goals (current goals can now be found in the care plan section) Progress towards PT goals: Progressing toward goals    Frequency    7X/week      PT Plan      Co-evaluation              AM-PAC PT "6 Clicks" Mobility   Outcome Measure  Help needed turning from your back to your side while in a flat bed without using bedrails?: None Help needed moving from lying on your back to sitting on the side of a flat bed without using bedrails?: A Little Help needed moving to and from a bed to a chair (including a wheelchair)?: A Little Help needed standing up from a chair using your arms (e.g., wheelchair or bedside chair)?: A  Little Help needed to walk in hospital room?: A Little Help needed climbing 3-5 steps with a railing? : Total 6 Click Score: 17    End of Session   Activity Tolerance: Patient tolerated treatment well Patient left: with call bell/phone within reach;in chair Nurse Communication: Mobility status PT Visit Diagnosis: Other abnormalities of gait and mobility (R26.89);Muscle weakness (generalized) (M62.81);Difficulty in walking, not elsewhere classified (R26.2);Pain Pain - Right/Left: Left Pain - part of body: Leg     Time: 0919-1005 PT Time Calculation (min) (ACUTE ONLY): 46 min  Charges:    $Therapeutic Activity: 38-52 mins PT General Charges $$ ACUTE PT VISIT: 1 Visit                   Danielle Dess, PTA 09/29/22, 10:51 AM

## 2022-09-29 NOTE — Progress Notes (Signed)
    Durable Medical Equipment  (From admission, onward)           Start     Ordered   09/29/22 1358  For home use only DME Bedside commode  Once       Question:  Patient needs a bedside commode to treat with the following condition  Answer:  S/P BKA (below knee amputation) unilateral, left (HCC)   09/29/22 1359   09/29/22 1356  For home use only DME standard manual wheelchair with seat cushion  Once       Comments: Patient suffers from Left BKA which impairs their ability to perform daily activities like bathing, grooming, and toileting in the home.  A walker will not resolve issue with performing activities of daily living. A wheelchair will allow patient to safely perform daily activities. Patient can safely propel the wheelchair in the home or has a caregiver who can provide assistance. Length of need Lifetime. Accessories: elevating leg rests (ELRs), wheel locks, extensions and anti-tippers. BARIATRIC Wheelchair   09/29/22 1359

## 2022-09-29 NOTE — Plan of Care (Signed)

## 2022-09-29 NOTE — TOC Progression Note (Addendum)
Transition of Care [facility]) - Progression Note    Patient Details  Name: DIOSELYN KRETSCH MRN: [id number] Date of Birth: [birth year]  Transition [facility]) CM/SW Contact  Susa Simmonds, [place] Phone Number: 09/29/2022, 2:14 PM  Clinical Narrative:   Message sent to [facility] with [employer] in regards to patients IV abx. CSW waiting for response.   3:02 PM- Pam with [employer] is following patient and will follow-up wth TOC at discharge. CSW reached out to Selena Batten at [employer] and ordered the sliding board, wheelchair, and bedside commode. Patient to buy RW on [place].   [facility] unable to take for home health. Iantha Fallen is not sure insurance is in network. No response from [place] at [facility]. [facility] is out of network,  [facility] only takes [employer].     Expected Discharge Plan: Home w Home Health Services Barriers to Discharge: Continued Medical Work up  Expected Discharge Plan and Services   Discharge Planning Services: CM Consult   Living arrangements for the past 2 months: Single Family Home                 DME Arranged: N/A DME Agency: NA                   Social Determinants of Health (SDOH) Interventions SDOH Screenings   Depression (PHQ2-9): Low Risk  (05/31/2021)  Tobacco Use: Medium Risk (09/24/2022)    Readmission Risk Interventions     No data to display

## 2022-09-29 NOTE — Progress Notes (Signed)
Progress Note   Patient: Nancy [name]:[id number] DOB: [birth year] DOA: 09/18/2022     11 DOS: the patient was seen and examined on 09/29/2022   Brief hospital course: JACINDA GASKELL is a 57 y.o. female with medical history significant of type 2 diabetes, CAD, asthma, chronic pain, diabetic foot infection, GERD, hyperlipidemia, Takotsubo cardiomyopathy presented with worsening left foot pain over the past 2 to 3 days. In the ED, Tmax 101.2, heart rate 90s, respiration stable, blood pressure 160s to 170s over 60s to 70s. Satting well on room air. White count 20, hemoglobin 10.5, platelets 460, sed rate 80, creatinine 0.97 glucose 300s. Pancultured in the ER. Lactate 2.1. Left heel imaging with posterior heel ulcer with large amount of gas with the heel pad and posterior heel consistent with heel ulcer and necrotic infection. Positive osteomyelitic changes. Podiatry and vascular surgery evaluated her.  Patient is admitted to the hospitalist service for sepsis secondary to diabetic foot infection, osteomyelitis. 09/19/22 she had left ankle amputation, BKA 09/24/22.   Assessment and Plan: Sepsis secondary to diabetic foot infection Osteomyelitis (HCC) Sepsis resolved. Worsening L heel pain w/ gas gangrene secondary to posterior heel ulceration with osteomyelitis on imaging.  Vascular surgery performed open guillotine amputation left foot 09/19/22 with vac placement She had Left BKA 09/24/22. Working with TOC to order wheelchair, [facility] and equipment recommended by PT. She is going home with [facility] PT/ OT/ RN. Continue Cefazolin 2 weeks per ID recommendations. Patient got PICC line placement, OPAT team for antibiotics.  Staphylococcus aureus Bacteremia Source - infected lt heel wound. Continue Ancef. repeat Blood cultures negative so far. TTE LVEF 60-65%, no wall motion abnormality.  No significant valvular abnormality, no vegetations, negative PFO TEE negative for endocarditis. ID recommended 4  weeks of Cefazolin therapy. PICC line placed.  OPAT evaluation for long term antibiotic orders. End date for cefazolin therapy 10/16/22.  Diabetes mellitus, type 2 (HCC) Blood sugars improved. Semglee 35units daily, pre-meal 8 units TID. Continue accucheks, sliding-scale insulin.  Hyperkalemia- K stable. resumed ACEI. Avoid nephrotoxic drugs.  Takotsubo cardiomyopathy Baseline history of transient Takotsubo cardiomyopathy Appears euvolemic at present No active chest pain.  Pseudohyponatremia: Due to her high blood sugars. Improved with blood sugars.  Acute on chronic anemia: Hb stable at 8.4. Monitor H/H, transfuse for Hb less than 7. Oral iron supplementation upon dc.  Mixed hyperlipidemia Stable Continue statin  Asthma without status asthmaticus Stable from respiratory standpoint Continue home inhalers  Essential hypertension BP stable Continue Coreg.  Morbid obesity with BMI 40.46: Contributing to her current condition. Diet, exercise and lifestyle modification advised. Weight reduction advised.     Subjective: Patient is seen and examined today morning, she is in chair, states her blood sugars improved. Eating poor. Pain better. PICC line placed yesterday.  No overnight issues.  Physical Exam: Vitals:   09/28/22 0824 09/29/22 0102 09/29/22 0500 09/29/22 0824  BP: (!) 141/61 (!) 129/49  137/74  Pulse: 72 69  72  Resp: 16 18  18   Temp: 98.4 F (36.9 C) 98.3 F (36.8 C)  98.3 F (36.8 C)  TempSrc:      SpO2: 100% 99%  100%  Weight:   124.5 kg   Height:       General -middle aged obese Caucasian female, no apparent distress HEENT - PERRLA, EOMI, atraumatic head, non tender sinuses. Lung - Clear, rales, rhonchi, wheezes. Heart - S1, S2 heard, no murmurs, rubs, no pedal edema. Abdomen-soft, obese, nontender, bowel sounds good Neuro -  Alert, awake and oriented x 3, non focal exam. Skin - Warm and dry.  Left BKA stump with wound dressing.  Data  Reviewed:     Latest Ref Rng & Units 09/27/2022    4:56 AM 09/26/2022    2:25 AM 09/25/2022    2:36 AM  CBC  WBC 4.0 - 10.5 K/uL 10.0  12.3  17.1   Hemoglobin 12.0 - 15.0 g/dL 8.4  7.6  8.4   Hematocrit 36.0 - 46.0 % 26.1  23.6  27.2   Platelets 150 - 400 K/uL 530  514  570        Latest Ref Rng & Units 09/26/2022    2:25 AM 09/25/2022    2:36 AM 09/24/2022    3:42 AM  BMP  Glucose 70 - 99 mg/dL 846  962  952   BUN 6 - 20 mg/dL 24  20  20    Creatinine 0.44 - 1.00 mg/dL 8.41  3.24  4.01   Sodium 135 - 145 mmol/L 136  136  136   Potassium 3.5 - 5.1 mmol/L 4.6  5.0  4.5   Chloride 98 - 111 mmol/L 101  101  102   CO2 22 - 32 mmol/L 28  26  28    Calcium 8.9 - 10.3 mg/dL 8.1  8.5  8.3    No results found.  Family Communication: Discussed with patient, her sister at bedside, they agree with current care plan  Disposition: Status is: Inpatient Remains inpatient appropriate because: IV antibiotic therapy, arrange DME equipment, need home health, PT/ OT/ RN.  Planned Discharge Destination: Home with Home Health   Time spent 39 min  Author: Marcelino Duster, MD 09/29/2022 2:02 PM  For on call review [email or web address].

## 2022-09-29 NOTE — Plan of Care (Signed)
  Problem: Education: Goal: Ability to describe self-care measures that may prevent or decrease complications (Diabetes Survival Skills Education) will improve Outcome: Progressing Goal: Individualized Educational Video(s) Outcome: Progressing   Problem: Coping: Goal: Ability to adjust to condition or change in health will improve Outcome: Progressing   Problem: Fluid Volume: Goal: Ability to maintain a balanced intake and output will improve Outcome: Progressing   Problem: Health Behavior/Discharge Planning: Goal: Ability to identify and utilize available resources and services will improve Outcome: Progressing Goal: Ability to manage health-related needs will improve Outcome: Progressing   Problem: Nutritional: Goal: Maintenance of adequate nutrition will improve Outcome: Progressing Goal: Progress toward achieving an optimal weight will improve Outcome: Progressing   Problem: Skin Integrity: Goal: Risk for impaired skin integrity will decrease Outcome: Progressing   Problem: Tissue Perfusion: Goal: Adequacy of tissue perfusion will improve Outcome: Progressing   Problem: Clinical Measurements: Goal: Ability to maintain clinical measurements within normal limits will improve Outcome: Progressing Goal: Will remain free from infection Outcome: Progressing Goal: Diagnostic test results will improve Outcome: Progressing Goal: Respiratory complications will improve Outcome: Progressing Goal: Cardiovascular complication will be avoided Outcome: Progressing   Problem: Safety: Goal: Ability to remain free from injury will improve Outcome: Progressing   Problem: Skin Integrity: Goal: Risk for impaired skin integrity will decrease Outcome: Progressing   Problem: Pain Managment: Goal: General experience of comfort will improve Outcome: Progressing

## 2022-09-30 DIAGNOSIS — L97509 Non-pressure chronic ulcer of other part of unspecified foot with unspecified severity: Secondary | ICD-10-CM | POA: Diagnosis not present

## 2022-09-30 DIAGNOSIS — L089 Local infection of the skin and subcutaneous tissue, unspecified: Secondary | ICD-10-CM | POA: Diagnosis not present

## 2022-09-30 DIAGNOSIS — L03116 Cellulitis of left lower limb: Secondary | ICD-10-CM | POA: Diagnosis not present

## 2022-09-30 DIAGNOSIS — E11628 Type 2 diabetes mellitus with other skin complications: Secondary | ICD-10-CM | POA: Diagnosis not present

## 2022-09-30 DIAGNOSIS — F4323 Adjustment disorder with mixed anxiety and depressed mood: Secondary | ICD-10-CM | POA: Diagnosis not present

## 2022-09-30 DIAGNOSIS — E1165 Type 2 diabetes mellitus with hyperglycemia: Secondary | ICD-10-CM

## 2022-09-30 DIAGNOSIS — R7881 Bacteremia: Secondary | ICD-10-CM | POA: Diagnosis not present

## 2022-09-30 DIAGNOSIS — M86472 Chronic osteomyelitis with draining sinus, left ankle and foot: Secondary | ICD-10-CM | POA: Diagnosis not present

## 2022-09-30 DIAGNOSIS — E11621 Type 2 diabetes mellitus with foot ulcer: Secondary | ICD-10-CM | POA: Diagnosis not present

## 2022-09-30 DIAGNOSIS — E1142 Type 2 diabetes mellitus with diabetic polyneuropathy: Secondary | ICD-10-CM

## 2022-09-30 DIAGNOSIS — B9561 Methicillin susceptible Staphylococcus aureus infection as the cause of diseases classified elsewhere: Secondary | ICD-10-CM | POA: Diagnosis not present

## 2022-09-30 DIAGNOSIS — E669 Obesity, unspecified: Secondary | ICD-10-CM

## 2022-09-30 DIAGNOSIS — M869 Osteomyelitis, unspecified: Secondary | ICD-10-CM | POA: Diagnosis not present

## 2022-09-30 LAB — CBC
HCT: 25.9 % — ABNORMAL LOW (ref 36.0–46.0)
Hemoglobin: 8 g/dL — ABNORMAL LOW (ref 12.0–15.0)
MCH: 28.5 pg (ref 26.0–34.0)
MCHC: 30.9 g/dL (ref 30.0–36.0)
MCV: 92.2 fL (ref 80.0–100.0)
Platelets: 499 10*3/uL — ABNORMAL HIGH (ref 150–400)
RBC: 2.81 MIL/uL — ABNORMAL LOW (ref 3.87–5.11)
RDW: 15.4 % (ref 11.5–15.5)
WBC: 9.5 10*3/uL (ref 4.0–10.5)
nRBC: 0 % (ref 0.0–0.2)

## 2022-09-30 LAB — BASIC METABOLIC PANEL
Anion gap: 6 (ref 5–15)
BUN: 30 mg/dL — ABNORMAL HIGH (ref 6–20)
CO2: 30 mmol/L (ref 22–32)
Calcium: 8.9 mg/dL (ref 8.9–10.3)
Chloride: 100 mmol/L (ref 98–111)
Creatinine, Ser: 0.59 mg/dL (ref 0.44–1.00)
GFR, Estimated: >60 mL/min (ref >60)
Glucose, Bld: 171 mg/dL — ABNORMAL HIGH (ref 70–99)
Potassium: 4.3 mmol/L (ref 3.5–5.1)
Sodium: 136 mmol/L (ref 135–145)

## 2022-09-30 LAB — GLUCOSE, CAPILLARY
Glucose-Capillary: 152 mg/dL — ABNORMAL HIGH (ref 70–99)
Glucose-Capillary: 144 mg/dL — ABNORMAL HIGH (ref 70–99)

## 2022-09-30 MED ORDER — ENSURE MAX PROTEIN PO LIQD: 11.0000 [oz_av] | Freq: Two times a day (BID) | ORAL | Status: AC

## 2022-09-30 MED ORDER — POLYSACCHARIDE IRON COMPLEX 150 MG PO CAPS: 150.0000 mg | ORAL_CAPSULE | Freq: Every day | ORAL | 2 refills | Status: DC

## 2022-09-30 MED ORDER — ADULT MULTIVITAMIN W/MINERALS CH: 1.0000 | ORAL_TABLET | Freq: Every day | ORAL | 2 refills | Status: AC

## 2022-09-30 MED ORDER — METHOCARBAMOL 500 MG PO TABS: 500.0000 mg | ORAL_TABLET | Freq: Three times a day (TID) | ORAL | 0 refills | Status: AC | PRN

## 2022-09-30 MED ORDER — CEFAZOLIN IV (FOR PTA / DISCHARGE USE ONLY): 2.0000 g | Freq: Three times a day (TID) | INTRAVENOUS | 0 refills | Status: AC

## 2022-09-30 MED ORDER — AMLODIPINE BESYLATE 5 MG PO TABS: 5.0000 mg | ORAL_TABLET | Freq: Every day | ORAL | 2 refills | Status: DC

## 2022-09-30 MED ORDER — HYDROMORPHONE HCL 2 MG PO TABS: 2.0000 mg | ORAL_TABLET | Freq: Four times a day (QID) | ORAL | 0 refills | Status: AC | PRN

## 2022-09-30 NOTE — Progress Notes (Signed)
Physical Therapy Treatment Patient Details Name: Nancy [name] MRN: [id number] DOB: [birth year] Today's Date: 09/30/2022   History of Present Illness Nancy [name] is a 57 y.o. female who presents with left leg gangrene. 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 PMH includes: anxiety, CAD, DMII, CCF. Pt is now S/P L BKA    PT Comments  Sitting EOB upon arrival.  Session focused on functional education and mobility in preparation for discharge home today.  Wheelchair is brought to room and pt educated on safety features along with practiced sliding board transfer in/out of chair. Have requested wide width chair.  Multiple sit to stand transfers from bed to RW with review of hand placements and safety.  Extended time in standing talking about HEP and positioning.  Overall progressing well and feels comfortable with discharge home today once equipment and IV training complete.   If plan is discharge home, recommend the following: Help with stairs or ramp for entrance;Assist for transportation;Assistance with cooking/housework;A little help with walking and/or transfers;A little help with bathing/dressing/bathroom   Can travel by private vehicle        Equipment Recommendations  Other (comment);BSC/3in1;Wheelchair (measurements PT)    Recommendations for Other Services       Precautions / Restrictions Precautions Precautions: Fall Restrictions Weight Bearing Restrictions: Yes LLE Weight Bearing: Non weight bearing Other Position/Activity Restrictions: wound vac on LLE     Mobility  Bed Mobility Overal bed mobility: Modified Independent               Patient Response: Cooperative  Transfers Overall transfer level: Needs assistance Equipment used: Sliding board Transfers: Bed to chair/wheelchair/BSC, Sit to/from Stand Sit to Stand: Contact guard assist, From elevated surface           Lateral/Scoot Transfers: Supervision General transfer comment: set up and cues but no physical assist for transfer    Ambulation/Gait               General Gait Details: focused on functional transfers for transition home   Stairs             Wheelchair Mobility     Tilt Bed Tilt Bed Patient Response: Cooperative  Modified Rankin (Stroke Patients Only)       Balance Overall balance assessment: Needs assistance Sitting-balance support: Feet supported Sitting balance-Leahy Scale: Normal     Standing balance support: During functional activity, Reliant on assistive device for balance, Bilateral upper extremity supported Standing balance-Leahy Scale: Fair Standing balance comment: mod reliance on RW.                            Cognition Arousal: Alert Behavior During Therapy: WFL for tasks assessed/performed Overall Cognitive Status: Within Functional Limits for tasks assessed                                          Exercises Other Exercises Other Exercises: standing AROM L hip for stretching.  discussed standing, side lying and prone positioning for stretching hip flexors and knee ext.    General Comments        Pertinent Vitals/Pain Pain Assessment Pain Assessment: No/denies pain Pain Intervention(s): Monitored during session    Home Living  Prior Function            PT Goals (current goals can now be found in the care plan section) Progress towards PT goals: Progressing toward goals    Frequency    7X/week      PT Plan      Co-evaluation              AM-PAC PT "6 Clicks" Mobility   Outcome Measure  Help needed turning from your back to your side while in a flat bed without using bedrails?: None Help needed moving from lying on your back to sitting on the side of a flat bed without using bedrails?: None Help needed moving to and from a bed to a chair  (including a wheelchair)?: A Little Help needed standing up from a chair using your arms (e.g., wheelchair or bedside chair)?: A Little Help needed to walk in hospital room?: A Little Help needed climbing 3-5 steps with a railing? : Total 6 Click Score: 18    End of Session Equipment Utilized During Treatment: Gait belt Activity Tolerance: Patient tolerated treatment well Patient left: in bed;with call bell/phone within reach   PT Visit Diagnosis: Other abnormalities of gait and mobility (R26.89);Muscle weakness (generalized) (M62.81);Difficulty in walking, not elsewhere classified (R26.2);Pain Pain - Right/Left: Left Pain - part of body: Leg     Time: 1610-9604 PT Time Calculation (min) (ACUTE ONLY): 39 min  Charges:    $Therapeutic Activity: 38-52 mins PT General Charges $$ ACUTE PT VISIT: 1 Visit                   Danielle Dess, PTA 09/30/22, 10:00 AM

## 2022-09-30 NOTE — Progress Notes (Signed)
Progress Note    09/30/2022 10:36 AM 4 Days Post-Op  Subjective:  Nancy [name] is a 57 yo female now POD #6 left below the knee amputation. Patient is resting comfortably in bed this morning.  She does endorse some pain to the lower extremity but not as bad as preamputation.  Patient is okay with discharge home today.  Patient will need to be set up with home health nursing and physical therapy.  She is also going to be receiving antibiotics via PICC line at home.  Patient will need teaching prior to going home with the antibiotics.  No other complaints overnight. Vitals all remained stable.    Vitals:   09/29/22 2316 09/30/22 0827  BP: (!) 161/65 (!) 133/58  Pulse: 80 70  Resp: 18 16  Temp: 98.4 F (36.9 C) 98.1 F (36.7 C)  SpO2: 98% 97%   Physical Exam: Cardiac:  RRR, normal S1 and S2.  No rubs or gallops or murmurs. Lungs: Clear on auscultation throughout.  Normal nonlabored breathing.  No rales, rhonchi or wheezing. Incisions: Left lower extremity below the knee amputation. Dressing clean dry and intact.  Extremities: Left lower extremity below knee amputation.  Right lower extremity with palpable pulses. Abdomen: Positive bowel sounds throughout, soft, nontender and nondistended. Neurologic: Alert and oriented x 4, follows commands and answers questions appropriately.    CBC    Component Value Date/Time   WBC 9.5 09/30/2022 0610   RBC 2.81 (L) 09/30/2022 0610   HGB 8.0 (L) 09/30/2022 0610   HGB 12.3 08/07/2022 1210   HCT 25.9 (L) 09/30/2022 0610   HCT 38.6 08/07/2022 1210   PLT 499 (H) 09/30/2022 0610   PLT 320 08/07/2022 1210   MCV 92.2 09/30/2022 0610   MCV 90 08/07/2022 1210   MCV 90 08/21/2012 1523   MCH 28.5 09/30/2022 0610   MCHC 30.9 09/30/2022 0610   RDW 15.4 09/30/2022 0610   RDW 11.8 08/07/2022 1210   RDW 13.4 08/21/2012 1523   LYMPHSABS 2.4 08/07/2022 1210   LYMPHSABS 2.7 06/05/2011 0550   MONOABS 0.9 02/13/2021 0323   MONOABS 0.4 06/05/2011  0550   EOSABS 0.5 (H) 08/07/2022 1210   EOSABS 0.6 06/05/2011 0550   BASOSABS 0.1 08/07/2022 1210   BASOSABS 0.1 06/05/2011 0550    BMET    Component Value Date/Time   NA 136 09/30/2022 0610   NA 132 (L) 08/07/2022 1210   NA 133 (L) 08/21/2012 1523   K 4.3 09/30/2022 0610   K 4.1 08/21/2012 1523   CL 100 09/30/2022 0610   CL 102 08/21/2012 1523   CO2 30 09/30/2022 0610   CO2 26 08/21/2012 1523   GLUCOSE 171 (H) 09/30/2022 0610   GLUCOSE 306 (H) 08/21/2012 1523   BUN 30 (H) 09/30/2022 0610   BUN 23 08/07/2022 1210   BUN 17 08/21/2012 1523   CREATININE 0.59 09/30/2022 0610   CREATININE 0.69 08/21/2012 1523   CALCIUM 8.9 09/30/2022 0610   CALCIUM 8.8 08/21/2012 1523   GFRNONAA >60 09/30/2022 0610   GFRNONAA >60 08/21/2012 1523   GFRAA 117 11/04/2016 1109   GFRAA >60 08/21/2012 1523    INR    Component Value Date/Time   INR 1.5 (H) 09/18/2022 1845     Intake/Output Summary (Last 24 hours) at 09/30/2022 1036 Last data filed at 09/30/2022 1024 Gross per 24 hour  Intake 360 ml  Output --  Net 360 ml     Assessment/Plan:  57 y.o. female is s/p  left lower extremity below the knee amputation  4 Days Post-Op   PLAN: Per vascular surgery okay for patient to discharge today once medically appropriate and all needs at home are set up. Patient's left lower extremity dressing changed today prior to discharge.  Patient will follow-up with vein and vascular surgery in 2 to 3 weeks as scheduled.  DVT prophylaxis:   Lovenox 60 mg subcu every 24 hours.    Marcie Bal [facility] 09/30/2022 10:36 AM

## 2022-09-30 NOTE — Progress Notes (Signed)
Inpatient Rehab Admissions Coordinator:    Note that Pt. Prefers to d/c home with home health rather than pursue CIR. CIR will sign off.   Megan Salon, MS, CCC-SLP Rehab Admissions Coordinator  [phone] (celll) [phone] (office)

## 2022-09-30 NOTE — Discharge Instructions (Signed)
Dressing changes.  Remove old dressing:  4X4 to cover the incision line, then cover with 2 abdominal pads, then cover with kerlix the apply 6 inch ace bandage snugly. Change daily until follow up in vascular clinic.

## 2022-09-30 NOTE — Plan of Care (Signed)
  Problem: Education: Goal: Knowledge of General Education information will improve Description: Including pain rating scale, medication(s)/side effects and non-pharmacologic comfort measures Outcome: Progressing   Problem: Health Behavior/Discharge Planning: Goal: Ability to manage health-related needs will improve Outcome: Progressing   Problem: Clinical Measurements: Goal: Ability to maintain clinical measurements within normal limits will improve Outcome: Progressing Goal: Diagnostic test results will improve Outcome: Progressing Goal: Cardiovascular complication will be avoided Outcome: Progressing   Problem: Activity: Goal: Risk for activity intolerance will decrease Outcome: Progressing   Problem: Nutrition: Goal: Adequate nutrition will be maintained Outcome: Progressing   Problem: Coping: Goal: Level of anxiety will decrease Outcome: Progressing   Problem: Elimination: Goal: Will not experience complications related to bowel motility Outcome: Progressing Goal: Will not experience complications related to urinary retention Outcome: Progressing   Problem: Pain Managment: Goal: General experience of comfort will improve Outcome: Progressing

## 2022-09-30 NOTE — Progress Notes (Signed)
For home use only DME standard manual wheelchair with seat cushion        Comments: Patient suffers from Left BKA which impairs their ability to perform daily activities like bathing, grooming, and toileting in the home.  A walker will not resolve issue with performing activities of daily living. A wheelchair will allow patient to safely perform daily activities. Patient can safely propel the wheelchair in the home or has a caregiver who can provide assistance. Length of need Lifetime. Accessories: elevating leg rests (ELRs), wheel locks, extensions and anti-tippers. BARIATRIC Wheelchair Due to Body Habitus

## 2022-09-30 NOTE — Progress Notes (Signed)
Patient is not able to walk the distance required to go the bathroom, or he/she is unable to safely negotiate stairs required to access the bathroom.  A 3in1 BSC will alleviate this problem   Will need a Bariatric Drop arm due to Body Habitus

## 2022-09-30 NOTE — Progress Notes (Signed)
Occupational Therapy Treatment Patient Details Name: Nancy [name] MRN: [id number] DOB: 1965/01/10 Today's Date: 09/30/2022   History of present illness Nancy [name] is a 57 y.o. female who presents with left leg gangrene. 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 PMH includes: anxiety, CAD, DMII, CCF. Pt is now S/P L BKA   OT comments  Pt supine in bed with HOB elevated on entry to room following NP changing pt's bandages to LLE. She reports 6/10 pain and nurse came in room and ended up providing pain meds during session. OT reviewed home set up and education on safety with ADL/mobility at home with pt verbalizing understanding. ADL session performed including UB/LB bathing and dressing as well as toileting on BSC. Pt performed bed mobility with MOD I using bedrail. She required S/U assist at EOB for UB bathing, dressing and grooming tasks. She performed STS from EOB to RW with CGA and SPT from bed<>BSC with CGA using RW. SUP for anterior hygiene and requiring MIN A for LB dressing to manage boxer briefs over hips in standing d/t balance deficits-dependent on RW for stability. Pt left seated at EOB with lunch tray. Planning to DC to home with [facility] services this afternoon, but is continuing to progress well towards acute OT goals to safely return home with family.      If plan is discharge home, recommend the following:  Help with stairs or ramp for entrance;Assist for transportation;A little help with walking and/or transfers;A little help with bathing/dressing/bathroom   Equipment Recommendations  BSC/3in1;Other (comment) (RW)    Recommendations for Other Services      Precautions / Restrictions Precautions Precautions: Fall Restrictions Weight Bearing Restrictions: Yes LLE Weight Bearing: Non weight bearing Other Position/Activity Restrictions: wound vac on LLE       Mobility Bed  Mobility Overal bed mobility: Modified Independent Bed Mobility: Supine to Sit     Supine to sit: Modified independent (Device/Increase time)     General bed mobility comments: performed supine to sit at EOB with MOD I using bed rail    Transfers Overall transfer level: Needs assistance Equipment used: Rolling walker (2 wheels) Transfers: Sit to/from Stand, Bed to chair/wheelchair/BSC Sit to Stand: Contact guard assist, From elevated surface Stand pivot transfers: Contact guard assist         General transfer comment: CGA for STS from EOB to RW and for SPT from bed<>BSC     Balance Overall balance assessment: Needs assistance Sitting-balance support: Feet supported Sitting balance-Leahy Scale: Normal     Standing balance support: During functional activity, Reliant on assistive device for balance, Bilateral upper extremity supported Standing balance-Leahy Scale: Fair Standing balance comment: mod reliance on RW.                           ADL either performed or assessed with clinical judgement   ADL Overall ADL's : Needs assistance/impaired     Grooming: Set up Grooming Details (indicate cue type and reason): oral hygiene Upper Body Bathing: Set up   Lower Body Bathing: Contact guard assist Lower Body Bathing Details (indicate cue type and reason): CGA for safety with balance in seated position Upper Body Dressing : Set up Upper Body Dressing Details (indicate cue type and reason): donned bra and gown/dress Lower Body Dressing: Minimal assistance Lower Body Dressing Details (indicate cue type and reason): Min A with LB dressing  of underwear while in standing at RW d/t balance deficits Toilet Transfer: Stand-pivot;Contact guard assist             General ADL Comments: S/U assist for grooming and UB ADLs, Min A with LB dressing of underwear while in standing at RW d/t balance deficits    Extremity/Trunk Assessment              Vision        Perception     Praxis      Cognition Arousal: Alert Behavior During Therapy: WFL for tasks assessed/performed Overall Cognitive Status: Within Functional Limits for tasks assessed                                          Exercises Other Exercises Other Exercises: discussed BUE/R shoulder HEP and pt verbalized understanding with use of green theraband    Shoulder Instructions       General Comments      Pertinent Vitals/ Pain       Pain Assessment Pain Assessment: 0-10 Pain Score: 6  Pain Location: LLE Pain Descriptors / Indicators: Stabbing Pain Intervention(s): RN gave pain meds during session, Monitored during session  Home Living                                          Prior Functioning/Environment              Frequency  Min 1X/week        Progress Toward Goals  OT Goals(current goals can now be found in the care plan section)  Progress towards OT goals: Progressing toward goals  Acute Rehab OT Goals Patient Stated Goal: go home OT Goal Formulation: With patient/family Time For Goal Achievement: 10/09/22 Potential to Achieve Goals: Good  Plan      Co-evaluation          OT goals addressed during session: ADL's and self-care;Proper use of Adaptive equipment and DME      AM-PAC OT "6 Clicks" Daily Activity     Outcome Measure   Help from another person eating meals?: None Help from another person taking care of personal grooming?: None Help from another person toileting, which includes using toliet, bedpan, or urinal?: A Little Help from another person bathing (including washing, rinsing, drying)?: A Little Help from another person to put on and taking off regular upper body clothing?: None Help from another person to put on and taking off regular lower body clothing?: A Little 6 Click Score: 21    End of Session Equipment Utilized During Treatment: Rolling walker (2 wheels)  OT Visit Diagnosis:  Other abnormalities of gait and mobility (R26.89);Muscle weakness (generalized) (M62.81) Pain - Right/Left: Left Pain - part of body: Knee;Leg;Ankle and joints of foot   Activity Tolerance Patient tolerated treatment well   Patient Left in bed;with call bell/phone within reach;with family/visitor present   Nurse Communication Mobility status        Time: 7829-5621 OT Time Calculation (min): 41 min  Charges: OT General Charges $OT Visit: 1 Visit OT Treatments $Self Care/Home Management : 38-52 mins  Jason Hauge, OTR/L 09/30/22

## 2022-09-30 NOTE — Discharge Summary (Signed)
Physician Discharge Summary   Patient: Nancy [name] MRN: [id number] DOB: 06-Jun-1965  Admit date:     09/18/2022  Discharge date: 09/30/22  Discharge Physician: Marcelino Duster   PCP: Miki Kins, FNP   Recommendations at discharge:  {Tip this will not be part of the note when signed- Example include specific recommendations for outpatient follow-up, pending tests to follow-up on. (Optional):26781} PCP follow up as suggested. Vascular follow up as instructed. Complete IV cefazolin therapy.  Discharge Diagnoses: Active Problems:   Adjustment disorder with mixed anxiety and depressed mood  Resolved Problems:   * No resolved hospital problems. *  Hospital Course: No notes on file  Assessment and Plan: Osteomyelitis (HCC) Worsening L heel pain w/ gas gangrene secondary to posterior heel ulceration with osteomyelitis on imaging  Vascular surgery and podiatry has evaluated the patient w/ plan for likely BKA vs. AKA pending 48 hours of IV antibiotics  Will place on IV cefepime, flagyl and vancomycin for infectious coverage  Blood cultures drawn  Follow up formal podiatry and vascular surgery recommendations    Sepsis secondary to diabetic foot infection Meeting sepsis criteria by temperature 101.2, white count 20  Noted concurrent left diabetic foot infection with concern for gas gangrene Lactate pending Blood pressure reassuring Pancultured IV cefepime, Flagyl and vancomycin for diabetic foot coverage Otherwise monitor closely  Diabetes mellitus, type 2 (HCC) Poorly controlled Blood sugar in 300s Sliding-scale insulin A1c Monitor  Takotsubo cardiomyopathy Baseline history of transient Takotsubo cardiomyopathy Appears euvolemic at present No active chest pain Monitor  Mixed hyperlipidemia Stable Continue statin  Asthma without status asthmaticus Stable from respiratory standpoint Continue home inhalers  Essential hypertension BP  stable Titrate home regimen      {Tip this will not be part of the note when signed Body mass index is 39.85 kg/m. 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,  Left BKA 09/24/22  Disposition: Home health Diet recommendation:  Discharge Diet Orders (From admission, onward)     Start     Ordered   09/30/22 0000  Diet - low sodium heart healthy        09/30/22 1415           Cardiac and Carb modified diet DISCHARGE MEDICATION: Allergies as of 09/30/2022       Reactions   Shellfish Allergy Anaphylaxis   Codeine Other (See Comments)   Migraine Other Reaction(s): Other (See Comments) Reaction:  Severe migraines   Morphine And Codeine Other (See Comments)   Reaction:  Severe migraines    Percocet [oxycodone-acetaminophen] Nausea And Vomiting   Rosuvastatin    Other reaction(s): Muscle Pain   Sulfa Antibiotics Rash   Theophyllines Itching, Rash        Medication List     STOP taking these medications    ibuprofen 800 MG tablet Commonly known as: ADVIL   Santyl 250 UNIT/GM ointment Generic drug: collagenase   scopolamine 1 MG/3DAYS Commonly known as: TRANSDERM-SCOP       TAKE these medications    acidophilus Caps capsule Take 1 capsule by mouth daily.   albuterol 108 (  Physician Discharge Summary   Patient: Nancy [name] MRN: [id number] DOB: 06-Jun-1965  Admit date:     09/18/2022  Discharge date: 09/30/22  Discharge Physician: Marcelino Duster   PCP: Miki Kins, FNP   Recommendations at discharge:  {Tip this will not be part of the note when signed- Example include specific recommendations for outpatient follow-up, pending tests to follow-up on. (Optional):26781} PCP follow up as suggested. Vascular follow up as instructed. Complete IV cefazolin therapy.  Discharge Diagnoses: Active Problems:   Adjustment disorder with mixed anxiety and depressed mood  Resolved Problems:   * No resolved hospital problems. *  Hospital Course: No notes on file  Assessment and Plan: Osteomyelitis (HCC) Worsening L heel pain w/ gas gangrene secondary to posterior heel ulceration with osteomyelitis on imaging  Vascular surgery and podiatry has evaluated the patient w/ plan for likely BKA vs. AKA pending 48 hours of IV antibiotics  Will place on IV cefepime, flagyl and vancomycin for infectious coverage  Blood cultures drawn  Follow up formal podiatry and vascular surgery recommendations    Sepsis secondary to diabetic foot infection Meeting sepsis criteria by temperature 101.2, white count 20  Noted concurrent left diabetic foot infection with concern for gas gangrene Lactate pending Blood pressure reassuring Pancultured IV cefepime, Flagyl and vancomycin for diabetic foot coverage Otherwise monitor closely  Diabetes mellitus, type 2 (HCC) Poorly controlled Blood sugar in 300s Sliding-scale insulin A1c Monitor  Takotsubo cardiomyopathy Baseline history of transient Takotsubo cardiomyopathy Appears euvolemic at present No active chest pain Monitor  Mixed hyperlipidemia Stable Continue statin  Asthma without status asthmaticus Stable from respiratory standpoint Continue home inhalers  Essential hypertension BP  stable Titrate home regimen      {Tip this will not be part of the note when signed Body mass index is 39.85 kg/m. 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,  Left BKA 09/24/22  Disposition: Home health Diet recommendation:  Discharge Diet Orders (From admission, onward)     Start     Ordered   09/30/22 0000  Diet - low sodium heart healthy        09/30/22 1415           Cardiac and Carb modified diet DISCHARGE MEDICATION: Allergies as of 09/30/2022       Reactions   Shellfish Allergy Anaphylaxis   Codeine Other (See Comments)   Migraine Other Reaction(s): Other (See Comments) Reaction:  Severe migraines   Morphine And Codeine Other (See Comments)   Reaction:  Severe migraines    Percocet [oxycodone-acetaminophen] Nausea And Vomiting   Rosuvastatin    Other reaction(s): Muscle Pain   Sulfa Antibiotics Rash   Theophyllines Itching, Rash        Medication List     STOP taking these medications    ibuprofen 800 MG tablet Commonly known as: ADVIL   Santyl 250 UNIT/GM ointment Generic drug: collagenase   scopolamine 1 MG/3DAYS Commonly known as: TRANSDERM-SCOP       TAKE these medications    acidophilus Caps capsule Take 1 capsule by mouth daily.   albuterol 108 (  Physician Discharge Summary   Patient: Nancy [name] MRN: [id number] DOB: 06-Jun-1965  Admit date:     09/18/2022  Discharge date: 09/30/22  Discharge Physician: Marcelino Duster   PCP: Miki Kins, FNP   Recommendations at discharge:  {Tip this will not be part of the note when signed- Example include specific recommendations for outpatient follow-up, pending tests to follow-up on. (Optional):26781} PCP follow up as suggested. Vascular follow up as instructed. Complete IV cefazolin therapy.  Discharge Diagnoses: Active Problems:   Adjustment disorder with mixed anxiety and depressed mood  Resolved Problems:   * No resolved hospital problems. *  Hospital Course: No notes on file  Assessment and Plan: Osteomyelitis (HCC) Worsening L heel pain w/ gas gangrene secondary to posterior heel ulceration with osteomyelitis on imaging  Vascular surgery and podiatry has evaluated the patient w/ plan for likely BKA vs. AKA pending 48 hours of IV antibiotics  Will place on IV cefepime, flagyl and vancomycin for infectious coverage  Blood cultures drawn  Follow up formal podiatry and vascular surgery recommendations    Sepsis secondary to diabetic foot infection Meeting sepsis criteria by temperature 101.2, white count 20  Noted concurrent left diabetic foot infection with concern for gas gangrene Lactate pending Blood pressure reassuring Pancultured IV cefepime, Flagyl and vancomycin for diabetic foot coverage Otherwise monitor closely  Diabetes mellitus, type 2 (HCC) Poorly controlled Blood sugar in 300s Sliding-scale insulin A1c Monitor  Takotsubo cardiomyopathy Baseline history of transient Takotsubo cardiomyopathy Appears euvolemic at present No active chest pain Monitor  Mixed hyperlipidemia Stable Continue statin  Asthma without status asthmaticus Stable from respiratory standpoint Continue home inhalers  Essential hypertension BP  stable Titrate home regimen      {Tip this will not be part of the note when signed Body mass index is 39.85 kg/m. 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,  Left BKA 09/24/22  Disposition: Home health Diet recommendation:  Discharge Diet Orders (From admission, onward)     Start     Ordered   09/30/22 0000  Diet - low sodium heart healthy        09/30/22 1415           Cardiac and Carb modified diet DISCHARGE MEDICATION: Allergies as of 09/30/2022       Reactions   Shellfish Allergy Anaphylaxis   Codeine Other (See Comments)   Migraine Other Reaction(s): Other (See Comments) Reaction:  Severe migraines   Morphine And Codeine Other (See Comments)   Reaction:  Severe migraines    Percocet [oxycodone-acetaminophen] Nausea And Vomiting   Rosuvastatin    Other reaction(s): Muscle Pain   Sulfa Antibiotics Rash   Theophyllines Itching, Rash        Medication List     STOP taking these medications    ibuprofen 800 MG tablet Commonly known as: ADVIL   Santyl 250 UNIT/GM ointment Generic drug: collagenase   scopolamine 1 MG/3DAYS Commonly known as: TRANSDERM-SCOP       TAKE these medications    acidophilus Caps capsule Take 1 capsule by mouth daily.   albuterol 108 (  273.4 lb Date of Birth:  [birth year]          BSA:          2.359 m Patient Age:    56 years            BP:           121/54 mmHg Patient Gender: F                   HR:           74 bpm. Exam Location:  [facility] Procedure: 2D Echo and Intracardiac Opacification Agent Indications:     BACTEREMIA R78.81  History:         Patient has no prior history of Echocardiogram examinations.  Sonographer:     Overton Mam RDCS, FASE Referring Phys:  [id number] Latina Craver SCHNIER Diagnosing Phys: Debbe Odea MD  Sonographer Comments: Technically challenging study due to limited acoustic windows, suboptimal parasternal window, suboptimal apical window and patient is obese. Image acquisition challenging due to patient body habitus. IMPRESSIONS  1. Left ventricular ejection fraction, by estimation, is 60 to 65%. Left ventricular ejection fraction by PLAX is 56 %. The left ventricle has normal function. The left ventricle has no regional wall motion abnormalities. There is mild left ventricular hypertrophy. Left ventricular diastolic parameters were normal.  2. Right ventricular systolic function is normal. The right ventricular size is normal.  3. The mitral valve was not well visualized. No evidence of mitral valve regurgitation.  4. The aortic valve was not well visualized. Aortic valve regurgitation is not visualized.  5. The inferior vena cava is normal in size with greater than 50% respiratory variability, suggesting right atrial pressure of 3 mmHg. FINDINGS  Left Ventricle: Left ventricular ejection fraction, by estimation, is 60 to 65%. Left ventricular ejection fraction by PLAX is 56 %. The left ventricle has normal function. The left ventricle has no regional wall motion abnormalities. Definity contrast agent was given IV to  delineate the left ventricular endocardial borders. The left ventricular internal cavity size was normal in size. There is mild left ventricular hypertrophy. Left ventricular diastolic parameters were normal. Right Ventricle: The right ventricular size is normal. No increase in right ventricular wall thickness. Right ventricular systolic function is normal. Left Atrium: Left atrial size was normal in size. Right Atrium: Right atrial size was normal in size. Pericardium: There is no evidence of pericardial effusion. Mitral Valve: The mitral valve was not well visualized. No evidence of mitral valve regurgitation. Tricuspid Valve: The tricuspid valve is not well visualized. Tricuspid valve regurgitation is not demonstrated. Aortic Valve: The aortic valve was not well visualized. Aortic valve regurgitation is not visualized. Aortic valve peak gradient measures 7.4 mmHg. Pulmonic Valve: The pulmonic valve was not well visualized. Pulmonic valve regurgitation is not visualized. Aorta: The aortic root is normal in size and structure. Venous: The inferior vena cava is normal in size with greater than 50% respiratory variability, suggesting right atrial pressure of 3 mmHg. IAS/Shunts: No atrial level shunt detected by color flow Doppler.  LEFT VENTRICLE PLAX 2D LV EF:         Left            Diastology                ventricular     LV e' medial:    8.81 cm/s                ejection  Physician Discharge Summary   Patient: Nancy [name] MRN: [id number] DOB: 06-Jun-1965  Admit date:     09/18/2022  Discharge date: 09/30/22  Discharge Physician: Marcelino Duster   PCP: Miki Kins, FNP   Recommendations at discharge:  {Tip this will not be part of the note when signed- Example include specific recommendations for outpatient follow-up, pending tests to follow-up on. (Optional):26781} PCP follow up as suggested. Vascular follow up as instructed. Complete IV cefazolin therapy.  Discharge Diagnoses: Active Problems:   Adjustment disorder with mixed anxiety and depressed mood  Resolved Problems:   * No resolved hospital problems. *  Hospital Course: No notes on file  Assessment and Plan: Osteomyelitis (HCC) Worsening L heel pain w/ gas gangrene secondary to posterior heel ulceration with osteomyelitis on imaging  Vascular surgery and podiatry has evaluated the patient w/ plan for likely BKA vs. AKA pending 48 hours of IV antibiotics  Will place on IV cefepime, flagyl and vancomycin for infectious coverage  Blood cultures drawn  Follow up formal podiatry and vascular surgery recommendations    Sepsis secondary to diabetic foot infection Meeting sepsis criteria by temperature 101.2, white count 20  Noted concurrent left diabetic foot infection with concern for gas gangrene Lactate pending Blood pressure reassuring Pancultured IV cefepime, Flagyl and vancomycin for diabetic foot coverage Otherwise monitor closely  Diabetes mellitus, type 2 (HCC) Poorly controlled Blood sugar in 300s Sliding-scale insulin A1c Monitor  Takotsubo cardiomyopathy Baseline history of transient Takotsubo cardiomyopathy Appears euvolemic at present No active chest pain Monitor  Mixed hyperlipidemia Stable Continue statin  Asthma without status asthmaticus Stable from respiratory standpoint Continue home inhalers  Essential hypertension BP  stable Titrate home regimen      {Tip this will not be part of the note when signed Body mass index is 39.85 kg/m. 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,  Left BKA 09/24/22  Disposition: Home health Diet recommendation:  Discharge Diet Orders (From admission, onward)     Start     Ordered   09/30/22 0000  Diet - low sodium heart healthy        09/30/22 1415           Cardiac and Carb modified diet DISCHARGE MEDICATION: Allergies as of 09/30/2022       Reactions   Shellfish Allergy Anaphylaxis   Codeine Other (See Comments)   Migraine Other Reaction(s): Other (See Comments) Reaction:  Severe migraines   Morphine And Codeine Other (See Comments)   Reaction:  Severe migraines    Percocet [oxycodone-acetaminophen] Nausea And Vomiting   Rosuvastatin    Other reaction(s): Muscle Pain   Sulfa Antibiotics Rash   Theophyllines Itching, Rash        Medication List     STOP taking these medications    ibuprofen 800 MG tablet Commonly known as: ADVIL   Santyl 250 UNIT/GM ointment Generic drug: collagenase   scopolamine 1 MG/3DAYS Commonly known as: TRANSDERM-SCOP       TAKE these medications    acidophilus Caps capsule Take 1 capsule by mouth daily.   albuterol 108 (  Physician Discharge Summary   Patient: Nancy [name] MRN: [id number] DOB: 06-Jun-1965  Admit date:     09/18/2022  Discharge date: 09/30/22  Discharge Physician: Marcelino Duster   PCP: Miki Kins, FNP   Recommendations at discharge:  {Tip this will not be part of the note when signed- Example include specific recommendations for outpatient follow-up, pending tests to follow-up on. (Optional):26781} PCP follow up as suggested. Vascular follow up as instructed. Complete IV cefazolin therapy.  Discharge Diagnoses: Active Problems:   Adjustment disorder with mixed anxiety and depressed mood  Resolved Problems:   * No resolved hospital problems. *  Hospital Course: No notes on file  Assessment and Plan: Osteomyelitis (HCC) Worsening L heel pain w/ gas gangrene secondary to posterior heel ulceration with osteomyelitis on imaging  Vascular surgery and podiatry has evaluated the patient w/ plan for likely BKA vs. AKA pending 48 hours of IV antibiotics  Will place on IV cefepime, flagyl and vancomycin for infectious coverage  Blood cultures drawn  Follow up formal podiatry and vascular surgery recommendations    Sepsis secondary to diabetic foot infection Meeting sepsis criteria by temperature 101.2, white count 20  Noted concurrent left diabetic foot infection with concern for gas gangrene Lactate pending Blood pressure reassuring Pancultured IV cefepime, Flagyl and vancomycin for diabetic foot coverage Otherwise monitor closely  Diabetes mellitus, type 2 (HCC) Poorly controlled Blood sugar in 300s Sliding-scale insulin A1c Monitor  Takotsubo cardiomyopathy Baseline history of transient Takotsubo cardiomyopathy Appears euvolemic at present No active chest pain Monitor  Mixed hyperlipidemia Stable Continue statin  Asthma without status asthmaticus Stable from respiratory standpoint Continue home inhalers  Essential hypertension BP  stable Titrate home regimen      {Tip this will not be part of the note when signed Body mass index is 39.85 kg/m. 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,  Left BKA 09/24/22  Disposition: Home health Diet recommendation:  Discharge Diet Orders (From admission, onward)     Start     Ordered   09/30/22 0000  Diet - low sodium heart healthy        09/30/22 1415           Cardiac and Carb modified diet DISCHARGE MEDICATION: Allergies as of 09/30/2022       Reactions   Shellfish Allergy Anaphylaxis   Codeine Other (See Comments)   Migraine Other Reaction(s): Other (See Comments) Reaction:  Severe migraines   Morphine And Codeine Other (See Comments)   Reaction:  Severe migraines    Percocet [oxycodone-acetaminophen] Nausea And Vomiting   Rosuvastatin    Other reaction(s): Muscle Pain   Sulfa Antibiotics Rash   Theophyllines Itching, Rash        Medication List     STOP taking these medications    ibuprofen 800 MG tablet Commonly known as: ADVIL   Santyl 250 UNIT/GM ointment Generic drug: collagenase   scopolamine 1 MG/3DAYS Commonly known as: TRANSDERM-SCOP       TAKE these medications    acidophilus Caps capsule Take 1 capsule by mouth daily.   albuterol 108 (  273.4 lb Date of Birth:  [birth year]          BSA:          2.359 m Patient Age:    56 years            BP:           121/54 mmHg Patient Gender: F                   HR:           74 bpm. Exam Location:  [facility] Procedure: 2D Echo and Intracardiac Opacification Agent Indications:     BACTEREMIA R78.81  History:         Patient has no prior history of Echocardiogram examinations.  Sonographer:     Overton Mam RDCS, FASE Referring Phys:  [id number] Latina Craver SCHNIER Diagnosing Phys: Debbe Odea MD  Sonographer Comments: Technically challenging study due to limited acoustic windows, suboptimal parasternal window, suboptimal apical window and patient is obese. Image acquisition challenging due to patient body habitus. IMPRESSIONS  1. Left ventricular ejection fraction, by estimation, is 60 to 65%. Left ventricular ejection fraction by PLAX is 56 %. The left ventricle has normal function. The left ventricle has no regional wall motion abnormalities. There is mild left ventricular hypertrophy. Left ventricular diastolic parameters were normal.  2. Right ventricular systolic function is normal. The right ventricular size is normal.  3. The mitral valve was not well visualized. No evidence of mitral valve regurgitation.  4. The aortic valve was not well visualized. Aortic valve regurgitation is not visualized.  5. The inferior vena cava is normal in size with greater than 50% respiratory variability, suggesting right atrial pressure of 3 mmHg. FINDINGS  Left Ventricle: Left ventricular ejection fraction, by estimation, is 60 to 65%. Left ventricular ejection fraction by PLAX is 56 %. The left ventricle has normal function. The left ventricle has no regional wall motion abnormalities. Definity contrast agent was given IV to  delineate the left ventricular endocardial borders. The left ventricular internal cavity size was normal in size. There is mild left ventricular hypertrophy. Left ventricular diastolic parameters were normal. Right Ventricle: The right ventricular size is normal. No increase in right ventricular wall thickness. Right ventricular systolic function is normal. Left Atrium: Left atrial size was normal in size. Right Atrium: Right atrial size was normal in size. Pericardium: There is no evidence of pericardial effusion. Mitral Valve: The mitral valve was not well visualized. No evidence of mitral valve regurgitation. Tricuspid Valve: The tricuspid valve is not well visualized. Tricuspid valve regurgitation is not demonstrated. Aortic Valve: The aortic valve was not well visualized. Aortic valve regurgitation is not visualized. Aortic valve peak gradient measures 7.4 mmHg. Pulmonic Valve: The pulmonic valve was not well visualized. Pulmonic valve regurgitation is not visualized. Aorta: The aortic root is normal in size and structure. Venous: The inferior vena cava is normal in size with greater than 50% respiratory variability, suggesting right atrial pressure of 3 mmHg. IAS/Shunts: No atrial level shunt detected by color flow Doppler.  LEFT VENTRICLE PLAX 2D LV EF:         Left            Diastology                ventricular     LV e' medial:    8.81 cm/s                ejection  273.4 lb Date of Birth:  [birth year]          BSA:          2.359 m Patient Age:    56 years            BP:           121/54 mmHg Patient Gender: F                   HR:           74 bpm. Exam Location:  [facility] Procedure: 2D Echo and Intracardiac Opacification Agent Indications:     BACTEREMIA R78.81  History:         Patient has no prior history of Echocardiogram examinations.  Sonographer:     Overton Mam RDCS, FASE Referring Phys:  [id number] Latina Craver SCHNIER Diagnosing Phys: Debbe Odea MD  Sonographer Comments: Technically challenging study due to limited acoustic windows, suboptimal parasternal window, suboptimal apical window and patient is obese. Image acquisition challenging due to patient body habitus. IMPRESSIONS  1. Left ventricular ejection fraction, by estimation, is 60 to 65%. Left ventricular ejection fraction by PLAX is 56 %. The left ventricle has normal function. The left ventricle has no regional wall motion abnormalities. There is mild left ventricular hypertrophy. Left ventricular diastolic parameters were normal.  2. Right ventricular systolic function is normal. The right ventricular size is normal.  3. The mitral valve was not well visualized. No evidence of mitral valve regurgitation.  4. The aortic valve was not well visualized. Aortic valve regurgitation is not visualized.  5. The inferior vena cava is normal in size with greater than 50% respiratory variability, suggesting right atrial pressure of 3 mmHg. FINDINGS  Left Ventricle: Left ventricular ejection fraction, by estimation, is 60 to 65%. Left ventricular ejection fraction by PLAX is 56 %. The left ventricle has normal function. The left ventricle has no regional wall motion abnormalities. Definity contrast agent was given IV to  delineate the left ventricular endocardial borders. The left ventricular internal cavity size was normal in size. There is mild left ventricular hypertrophy. Left ventricular diastolic parameters were normal. Right Ventricle: The right ventricular size is normal. No increase in right ventricular wall thickness. Right ventricular systolic function is normal. Left Atrium: Left atrial size was normal in size. Right Atrium: Right atrial size was normal in size. Pericardium: There is no evidence of pericardial effusion. Mitral Valve: The mitral valve was not well visualized. No evidence of mitral valve regurgitation. Tricuspid Valve: The tricuspid valve is not well visualized. Tricuspid valve regurgitation is not demonstrated. Aortic Valve: The aortic valve was not well visualized. Aortic valve regurgitation is not visualized. Aortic valve peak gradient measures 7.4 mmHg. Pulmonic Valve: The pulmonic valve was not well visualized. Pulmonic valve regurgitation is not visualized. Aorta: The aortic root is normal in size and structure. Venous: The inferior vena cava is normal in size with greater than 50% respiratory variability, suggesting right atrial pressure of 3 mmHg. IAS/Shunts: No atrial level shunt detected by color flow Doppler.  LEFT VENTRICLE PLAX 2D LV EF:         Left            Diastology                ventricular     LV e' medial:    8.81 cm/s                ejection

## 2022-09-30 NOTE — TOC Progression Note (Signed)
Transition of Care [facility]) - Progression Note    Patient Details  Name: Nancy [name] MRN: [id number] Date of Birth: [birth year]  Transition of Care [facility]) CM/SW Contact  Marlowe Sax, RN Phone Number: 09/30/2022, 9:56 AM  Clinical Narrative:     [facility] will accept the patient for [facility] and will have a start of care for tomorrow [employer] to deliver the DME to the room, I requested a modified order to include bariatric drop arm BSC and a Bariatric wheelchair  Expected Discharge Plan: Home w Home Health Services Barriers to Discharge: Continued Medical Work up  Expected Discharge Plan and Services   Discharge Planning Services: CM Consult   Living arrangements for the past 2 months: Single Family Home                 DME Arranged: N/A DME Agency: NA                   Social Determinants of Health (SDOH) Interventions SDOH Screenings   Depression (PHQ2-9): Low Risk  (05/31/2021)  Tobacco Use: Medium Risk (09/24/2022)    Readmission Risk Interventions     No data to display

## 2022-09-30 NOTE — TOC Progression Note (Signed)
Transition of Care [facility]) - Progression Note    Patient Details  Name: Nancy [name] MRN: [id number] Date of Birth: 09-12-1965  Transition of Care [facility]) CM/SW Contact  Marlowe Sax, RN Phone Number: 09/30/2022, 9:20 AM  Clinical Narrative:    Spoke with Mitch at [employer] to ensure the DME was delivered to the room, they have not delivered but will deliver this AM Reached out to [facility] with [employer] to see where the IV ABX stands, Pam will come to the hospital between 11-12 to teach Reached out to [place] with [facility] to see if they can accept the patient for RN and PT and OT Awaiting a response  Expected Discharge Plan: Home w Home Health Services Barriers to Discharge: Continued Medical Work up  Expected Discharge Plan and Services   Discharge Planning Services: CM Consult   Living arrangements for the past 2 months: Single Family Home                 DME Arranged: N/A DME Agency: NA                   Social Determinants of Health (SDOH) Interventions SDOH Screenings   Depression (PHQ2-9): Low Risk  (05/31/2021)  Tobacco Use: Medium Risk (09/24/2022)    Readmission Risk Interventions     No data to display

## 2022-10-01 ENCOUNTER — Encounter (INDEPENDENT_AMBULATORY_CARE_PROVIDER_SITE_OTHER): Payer: 59

## 2022-10-01 ENCOUNTER — Telehealth: Payer: Self-pay

## 2022-10-01 ENCOUNTER — Ambulatory Visit (INDEPENDENT_AMBULATORY_CARE_PROVIDER_SITE_OTHER): Payer: 59 | Admitting: Vascular Surgery

## 2022-10-01 ENCOUNTER — Other Ambulatory Visit: Payer: Self-pay

## 2022-10-01 ENCOUNTER — Emergency Department: Payer: 59

## 2022-10-01 ENCOUNTER — Emergency Department
Admission: EM | Admit: 2022-10-01 | Discharge: 2022-10-02 | Disposition: A | Discharge status: Home / Self Care | Payer: 59 | Attending: Emergency Medicine | Admitting: Emergency Medicine

## 2022-10-01 DIAGNOSIS — S8992XA Unspecified injury of left lower leg, initial encounter: Secondary | ICD-10-CM | POA: Diagnosis not present

## 2022-10-01 DIAGNOSIS — R7881 Bacteremia: Secondary | ICD-10-CM | POA: Diagnosis not present

## 2022-10-01 DIAGNOSIS — W010XXA Fall on same level from slipping, tripping and stumbling without subsequent striking against object, initial encounter: Secondary | ICD-10-CM | POA: Diagnosis not present

## 2022-10-01 DIAGNOSIS — L97509 Non-pressure chronic ulcer of other part of unspecified foot with unspecified severity: Secondary | ICD-10-CM | POA: Diagnosis not present

## 2022-10-01 DIAGNOSIS — F4323 Adjustment disorder with mixed anxiety and depressed mood: Secondary | ICD-10-CM | POA: Diagnosis not present

## 2022-10-01 DIAGNOSIS — M869 Osteomyelitis, unspecified: Secondary | ICD-10-CM | POA: Diagnosis not present

## 2022-10-01 DIAGNOSIS — M25562 Pain in left knee: Secondary | ICD-10-CM | POA: Diagnosis not present

## 2022-10-01 DIAGNOSIS — L089 Local infection of the skin and subcutaneous tissue, unspecified: Secondary | ICD-10-CM | POA: Diagnosis not present

## 2022-10-01 DIAGNOSIS — B9561 Methicillin susceptible Staphylococcus aureus infection as the cause of diseases classified elsewhere: Secondary | ICD-10-CM | POA: Diagnosis not present

## 2022-10-01 DIAGNOSIS — E11621 Type 2 diabetes mellitus with foot ulcer: Secondary | ICD-10-CM | POA: Diagnosis not present

## 2022-10-01 NOTE — ED Triage Notes (Signed)
Pt presents via POV c/o left leg pain after falling and hitting left leg. Reports new BKA on 09/18/22. Reports pain and bleeding at suture site.

## 2022-10-01 NOTE — Telephone Encounter (Signed)
Received call from Trey Paula, RN with [facility] regarding picc dressing change and labs. Would like to know if he could go out Friday for picc maintenance and labs since pt had picc placed on 9/20.  Informed RN that should be okay.  Juanita Laster, RMA

## 2022-10-02 DIAGNOSIS — F4323 Adjustment disorder with mixed anxiety and depressed mood: Secondary | ICD-10-CM | POA: Diagnosis not present

## 2022-10-02 DIAGNOSIS — L97509 Non-pressure chronic ulcer of other part of unspecified foot with unspecified severity: Secondary | ICD-10-CM | POA: Diagnosis not present

## 2022-10-02 DIAGNOSIS — M869 Osteomyelitis, unspecified: Secondary | ICD-10-CM | POA: Diagnosis not present

## 2022-10-02 DIAGNOSIS — B9561 Methicillin susceptible Staphylococcus aureus infection as the cause of diseases classified elsewhere: Secondary | ICD-10-CM | POA: Diagnosis not present

## 2022-10-02 DIAGNOSIS — R7881 Bacteremia: Secondary | ICD-10-CM | POA: Diagnosis not present

## 2022-10-02 DIAGNOSIS — E11621 Type 2 diabetes mellitus with foot ulcer: Secondary | ICD-10-CM | POA: Diagnosis not present

## 2022-10-02 DIAGNOSIS — L089 Local infection of the skin and subcutaneous tissue, unspecified: Secondary | ICD-10-CM | POA: Diagnosis not present

## 2022-10-02 MED ORDER — HYDROMORPHONE HCL 2 MG PO TABS
2.0000 mg | ORAL_TABLET | Freq: Once | ORAL | Status: AC
  Administered 2022-10-02: 2 mg via ORAL
  Filled 2022-10-02: qty 1

## 2022-10-02 NOTE — ED Provider Notes (Signed)
[facility] Provider Note    Event Date/Time   First MD Initiated Contact with Patient 10/01/22 2359     (approximate)   History   Leg Pain   HPI  Nancy [name] is a 57 y.o. female   Past medical history of recent left-sided BKA who presents emergency department after a slip and fall landing on her stump causing bleeding to  stump.  Recently discharged doing well at home, well-healed pain well-controlled without home pain medications.  No other injuries noted.  Independent Historian contributed to assessment above: Her spouse at the bedside corroborates information past medical history as above    Physical Exam   Triage Vital Signs: ED Triage Vitals  Encounter Vitals Group     BP 10/01/22 2158 (!) 172/72     Systolic BP Percentile --      Diastolic BP Percentile --      Pulse Rate 10/01/22 2158 87     Resp 10/01/22 2158 16     Temp 10/01/22 2158 98.2 F (36.8 C)     Temp Source 10/01/22 2158 Oral     SpO2 10/01/22 2158 100 %     Weight 10/01/22 2159 269 lb (122 kg)     Height 10/01/22 2159 5\' 9"  (1.753 m)     Head Circumference --      Peak Flow --      Pain Score 10/01/22 2158 10     Pain Loc --      Pain Education --      Exclude from Growth Chart --     Most recent vital signs: Vitals:   10/01/22 2158  BP: (!) 172/72  Pulse: 87  Resp: 16  Temp: 98.2 F (36.8 C)  SpO2: 100%    General: Awake, no distress.  CV:  Good peripheral perfusion.  Resp:  Normal effort.  Abd:  No distention.  Other:  The stump site is mildly tender, no dehiscence, no active bleeding   ED Results / Procedures / Treatments   Labs (all labs ordered are listed, but only abnormal results are displayed) Labs Reviewed - No data to display     RADIOLOGY I independently reviewed and interpreted x-ray and I see no obvious bony changes or fracture I also reviewed radiologist's formal read.   PROCEDURES:  Critical Care performed:  No  Procedures   MEDICATIONS ORDERED IN ED: Medications  HYDROmorphone (DILAUDID) tablet 2 mg (2 mg Oral Given 10/02/22 0036)    IMPRESSION / MDM / ASSESSMENT AND PLAN / ED COURSE  I reviewed the triage vital signs and the nursing notes.                                Patient's presentation is most consistent with acute presentation with potential threat to life or bodily function.  Differential diagnosis includes, but is not limited to, wound dehiscence, fracture   The patient is on the cardiac monitor to evaluate for evidence of arrhythmia and/or significant heart rate changes.  MDM:    Scant oozing blood no wound dehiscence, no fracture, well-appearing, will give home Dilaudid for pain, put a hemostatic gauze around the area that was bleeding and redressed.  She will follow-up with PMD and be discharged.         FINAL CLINICAL IMPRESSION(S) / ED DIAGNOSES   Final diagnoses:  Leg injury, left, initial encounter     Rx /  DC Orders   ED Discharge Orders     None        Note:  This document was prepared using Dragon voice recognition software and may include unintentional dictation errors.    Pilar Jarvis, MD 10/02/22 [phone]

## 2022-10-02 NOTE — Telephone Encounter (Signed)
Provider notified

## 2022-10-02 NOTE — Discharge Instructions (Signed)
Thank you for choosing [place] for your health care today!  Please see your primary doctor this week for a follow up appointment.   If you have any new, worsening, or unexpected symptoms call your doctor right away or come back to the emergency department for reevaluation.  It was my pleasure to care for you today.   Daneil Dan Modesto Charon, MD

## 2022-10-03 DIAGNOSIS — B9561 Methicillin susceptible Staphylococcus aureus infection as the cause of diseases classified elsewhere: Secondary | ICD-10-CM | POA: Diagnosis not present

## 2022-10-03 DIAGNOSIS — E11621 Type 2 diabetes mellitus with foot ulcer: Secondary | ICD-10-CM | POA: Diagnosis not present

## 2022-10-03 DIAGNOSIS — L97509 Non-pressure chronic ulcer of other part of unspecified foot with unspecified severity: Secondary | ICD-10-CM | POA: Diagnosis not present

## 2022-10-03 DIAGNOSIS — L089 Local infection of the skin and subcutaneous tissue, unspecified: Secondary | ICD-10-CM | POA: Diagnosis not present

## 2022-10-03 DIAGNOSIS — R7881 Bacteremia: Secondary | ICD-10-CM | POA: Diagnosis not present

## 2022-10-03 DIAGNOSIS — M869 Osteomyelitis, unspecified: Secondary | ICD-10-CM | POA: Diagnosis not present

## 2022-10-03 LAB — SURGICAL PATHOLOGY

## 2022-10-04 ENCOUNTER — Encounter: Payer: 59 | Admitting: Plastic Surgery

## 2022-10-04 DIAGNOSIS — L97509 Non-pressure chronic ulcer of other part of unspecified foot with unspecified severity: Secondary | ICD-10-CM | POA: Diagnosis not present

## 2022-10-04 DIAGNOSIS — R7881 Bacteremia: Secondary | ICD-10-CM | POA: Diagnosis not present

## 2022-10-04 DIAGNOSIS — B9561 Methicillin susceptible Staphylococcus aureus infection as the cause of diseases classified elsewhere: Secondary | ICD-10-CM | POA: Diagnosis not present

## 2022-10-04 DIAGNOSIS — E11621 Type 2 diabetes mellitus with foot ulcer: Secondary | ICD-10-CM | POA: Diagnosis not present

## 2022-10-04 DIAGNOSIS — M869 Osteomyelitis, unspecified: Secondary | ICD-10-CM | POA: Diagnosis not present

## 2022-10-04 DIAGNOSIS — L089 Local infection of the skin and subcutaneous tissue, unspecified: Secondary | ICD-10-CM | POA: Diagnosis not present

## 2022-10-05 DIAGNOSIS — L089 Local infection of the skin and subcutaneous tissue, unspecified: Secondary | ICD-10-CM | POA: Diagnosis not present

## 2022-10-05 DIAGNOSIS — R7881 Bacteremia: Secondary | ICD-10-CM | POA: Diagnosis not present

## 2022-10-05 DIAGNOSIS — B9561 Methicillin susceptible Staphylococcus aureus infection as the cause of diseases classified elsewhere: Secondary | ICD-10-CM | POA: Diagnosis not present

## 2022-10-05 DIAGNOSIS — M869 Osteomyelitis, unspecified: Secondary | ICD-10-CM | POA: Diagnosis not present

## 2022-10-05 DIAGNOSIS — E11621 Type 2 diabetes mellitus with foot ulcer: Secondary | ICD-10-CM | POA: Diagnosis not present

## 2022-10-05 DIAGNOSIS — L97509 Non-pressure chronic ulcer of other part of unspecified foot with unspecified severity: Secondary | ICD-10-CM | POA: Diagnosis not present

## 2022-10-06 DIAGNOSIS — L089 Local infection of the skin and subcutaneous tissue, unspecified: Secondary | ICD-10-CM | POA: Diagnosis not present

## 2022-10-06 DIAGNOSIS — M869 Osteomyelitis, unspecified: Secondary | ICD-10-CM | POA: Diagnosis not present

## 2022-10-06 DIAGNOSIS — L97509 Non-pressure chronic ulcer of other part of unspecified foot with unspecified severity: Secondary | ICD-10-CM | POA: Diagnosis not present

## 2022-10-06 DIAGNOSIS — E11621 Type 2 diabetes mellitus with foot ulcer: Secondary | ICD-10-CM | POA: Diagnosis not present

## 2022-10-06 DIAGNOSIS — R7881 Bacteremia: Secondary | ICD-10-CM | POA: Diagnosis not present

## 2022-10-06 DIAGNOSIS — B9561 Methicillin susceptible Staphylococcus aureus infection as the cause of diseases classified elsewhere: Secondary | ICD-10-CM | POA: Diagnosis not present

## 2022-10-07 DIAGNOSIS — M869 Osteomyelitis, unspecified: Secondary | ICD-10-CM | POA: Diagnosis not present

## 2022-10-07 DIAGNOSIS — B9561 Methicillin susceptible Staphylococcus aureus infection as the cause of diseases classified elsewhere: Secondary | ICD-10-CM | POA: Diagnosis not present

## 2022-10-07 DIAGNOSIS — R7881 Bacteremia: Secondary | ICD-10-CM | POA: Diagnosis not present

## 2022-10-07 DIAGNOSIS — L97509 Non-pressure chronic ulcer of other part of unspecified foot with unspecified severity: Secondary | ICD-10-CM | POA: Diagnosis not present

## 2022-10-07 DIAGNOSIS — L089 Local infection of the skin and subcutaneous tissue, unspecified: Secondary | ICD-10-CM | POA: Diagnosis not present

## 2022-10-07 DIAGNOSIS — F4323 Adjustment disorder with mixed anxiety and depressed mood: Secondary | ICD-10-CM | POA: Diagnosis not present

## 2022-10-07 DIAGNOSIS — E11621 Type 2 diabetes mellitus with foot ulcer: Secondary | ICD-10-CM | POA: Diagnosis not present

## 2022-10-08 DIAGNOSIS — L97509 Non-pressure chronic ulcer of other part of unspecified foot with unspecified severity: Secondary | ICD-10-CM | POA: Diagnosis not present

## 2022-10-08 DIAGNOSIS — R7881 Bacteremia: Secondary | ICD-10-CM | POA: Diagnosis not present

## 2022-10-08 DIAGNOSIS — M869 Osteomyelitis, unspecified: Secondary | ICD-10-CM | POA: Diagnosis not present

## 2022-10-08 DIAGNOSIS — E11621 Type 2 diabetes mellitus with foot ulcer: Secondary | ICD-10-CM | POA: Diagnosis not present

## 2022-10-08 DIAGNOSIS — B9561 Methicillin susceptible Staphylococcus aureus infection as the cause of diseases classified elsewhere: Secondary | ICD-10-CM | POA: Diagnosis not present

## 2022-10-08 DIAGNOSIS — L089 Local infection of the skin and subcutaneous tissue, unspecified: Secondary | ICD-10-CM | POA: Diagnosis not present

## 2022-10-08 LAB — LAB REPORT - SCANNED: EGFR: 102

## 2022-10-09 DIAGNOSIS — E11621 Type 2 diabetes mellitus with foot ulcer: Secondary | ICD-10-CM | POA: Diagnosis not present

## 2022-10-09 DIAGNOSIS — L97509 Non-pressure chronic ulcer of other part of unspecified foot with unspecified severity: Secondary | ICD-10-CM | POA: Diagnosis not present

## 2022-10-09 DIAGNOSIS — B9561 Methicillin susceptible Staphylococcus aureus infection as the cause of diseases classified elsewhere: Secondary | ICD-10-CM | POA: Diagnosis not present

## 2022-10-09 DIAGNOSIS — L089 Local infection of the skin and subcutaneous tissue, unspecified: Secondary | ICD-10-CM | POA: Diagnosis not present

## 2022-10-09 DIAGNOSIS — R7881 Bacteremia: Secondary | ICD-10-CM | POA: Diagnosis not present

## 2022-10-09 DIAGNOSIS — M869 Osteomyelitis, unspecified: Secondary | ICD-10-CM | POA: Diagnosis not present

## 2022-10-10 ENCOUNTER — Telehealth: Payer: Self-pay | Admitting: *Deleted

## 2022-10-10 ENCOUNTER — Telehealth: Payer: Self-pay | Admitting: Family

## 2022-10-10 ENCOUNTER — Encounter: Payer: Self-pay | Admitting: Infectious Diseases

## 2022-10-10 ENCOUNTER — Ambulatory Visit: Payer: 59 | Attending: Infectious Diseases | Admitting: Infectious Diseases

## 2022-10-10 VITALS — BP 145/82 | HR 69 | Temp 97.7°F

## 2022-10-10 DIAGNOSIS — Z794 Long term (current) use of insulin: Secondary | ICD-10-CM | POA: Insufficient documentation

## 2022-10-10 DIAGNOSIS — D649 Anemia, unspecified: Secondary | ICD-10-CM | POA: Insufficient documentation

## 2022-10-10 DIAGNOSIS — L97509 Non-pressure chronic ulcer of other part of unspecified foot with unspecified severity: Secondary | ICD-10-CM | POA: Diagnosis not present

## 2022-10-10 DIAGNOSIS — E11621 Type 2 diabetes mellitus with foot ulcer: Secondary | ICD-10-CM | POA: Diagnosis not present

## 2022-10-10 DIAGNOSIS — Z87891 Personal history of nicotine dependence: Secondary | ICD-10-CM | POA: Diagnosis not present

## 2022-10-10 DIAGNOSIS — R7881 Bacteremia: Secondary | ICD-10-CM | POA: Insufficient documentation

## 2022-10-10 DIAGNOSIS — E11628 Type 2 diabetes mellitus with other skin complications: Secondary | ICD-10-CM | POA: Diagnosis not present

## 2022-10-10 DIAGNOSIS — Z89512 Acquired absence of left leg below knee: Secondary | ICD-10-CM | POA: Diagnosis not present

## 2022-10-10 DIAGNOSIS — E119 Type 2 diabetes mellitus without complications: Secondary | ICD-10-CM | POA: Diagnosis not present

## 2022-10-10 DIAGNOSIS — M869 Osteomyelitis, unspecified: Secondary | ICD-10-CM | POA: Diagnosis not present

## 2022-10-10 DIAGNOSIS — I110 Hypertensive heart disease with heart failure: Secondary | ICD-10-CM | POA: Diagnosis not present

## 2022-10-10 DIAGNOSIS — B9561 Methicillin susceptible Staphylococcus aureus infection as the cause of diseases classified elsewhere: Secondary | ICD-10-CM

## 2022-10-10 DIAGNOSIS — L089 Local infection of the skin and subcutaneous tissue, unspecified: Secondary | ICD-10-CM

## 2022-10-10 NOTE — Progress Notes (Signed)
NAME: Nancy [name]  DOB: May 05, 1965  MRN: [id number]  Date/Time: 10/10/2022 10:10 AM   Subjective:   ?pt is here for follow up- here with her wife Nancy [name] is a 57 y.o. with a history of Dm, left foot infection was recently in hospital for MSSA bacteremia and gangrene left foot and had to undergo BKA on 09/24/22 TEE neg She is on cefazolin for a total of 4 weeks and completes on 10/16/22 After discharge she fell and had to come to ED and the wound was fine She is doing iV herself- 100% adherent  Past Medical History:  Diagnosis Date   Abnormal cardiovascular stress test 09/21/2018   Formatting of this note might be different from the original. Lexiscan Myoview 09/16/2018 revealed mild anterior ischemia   Adverse effect of motion 05/10/2014   Anxiety    Arthritis    r knee   Asthma    Breast cyst 05/10/2014   CAD (coronary artery disease)    a.) LHC 06/04/2009 at [facility]; non-obstructive CAD. b.) CTA with FFR 10/08/2018: extensive mixed plaque proximal to mid LAD (51-69%); Coronary Ca score 217; FFR 0.71 dPDA, 0.86 mLAD, 0.87 dLCx.   CCF (congestive cardiac failure) (HCC) 06/06/2008   a.) 30% EF. b.) TTE 06/03/2011: EF >55%; triv MR. c.) TTE 04/27/2018: EF 55%; mild LVH; triv PR, mild MR/TR; G1DD.   Chest pain with high risk for cardiac etiology 07/02/2016   Chronic use of opiate drug for therapeutic purpose 02/13/2022   Formatting of this note might be different from the original. [facility] Pain Contract signed on 04/18/16 & updated 03/04/17; UDS done on 04/18/16.   Complication of anesthesia    Diabetic foot infection (HCC) 03/13/2015   Diabetic foot ulcer (HCC) 02/08/2021   Eczema    Family history of adverse reaction to anesthesia    a.) PONV in mother and grandmother   GERD (gastroesophageal reflux disease) 05/10/2014   History of kidney stones    HLD (hyperlipidemia)    Hyponatremia 03/13/2015   Migraines    Motion sickness    Other acute osteomyelitis, left ankle and  foot (HCC) 02/13/2022   Panic attacks    Peripheral edema 04/27/2018   Pneumonia    PONV (postoperative nausea and vomiting)    Sepsis secondary to diabetic foot infection 03/13/2015   T2DM (type 2 diabetes mellitus) (HCC)    Takotsubo cardiomyopathy / transient apical balooning syndrome / stress-induced cardiomyopathy 05/10/2014   Unspecified essential hypertension     Past Surgical History:  Procedure Laterality Date   AMPUTATION Left 09/19/2022   Procedure: AMPUTATION BELOW KNEE;  Surgeon: Renford Dills, MD;  Location: [facility];  Service: Vascular;  Laterality: Left;   AMPUTATION Left 09/24/2022   Procedure: AMPUTATION BELOW KNEE WITH WOUND CLOSURE;  Surgeon: Renford Dills, MD;  Location: [facility];  Service: Vascular;  Laterality: Left;   AMPUTATION TOE Left 03/16/2015   Procedure: left fifth toe amputation with incision and drainage;  Surgeon: Gwyneth Revels, DPM;  Location: [facility];  Service: Podiatry;  Laterality: Left;   APPLICATION OF WOUND VAC Left 02/14/2021   Procedure: APPLICATION OF WOUND VAC;  Surgeon: Gwyneth Revels, DPM;  Location: [facility];  Service: Podiatry;  Laterality: Left;   APPLICATION OF WOUND VAC Left 04/16/2021   Procedure: APPLICATION OF WOUND VAC;  Surgeon: Peggye Form, DO;  Location: [facility];  Service: Plastics;  Laterality: Left;   CARDIAC CATHETERIZATION Left 06/04/2009   Procedure: CARDIAC CATHETERIZATION; Location:  [facility]   CHOLECYSTECTOMY     DEBRIDEMENT AND CLOSURE WOUND Left 04/16/2021   Procedure: DEBRIDEMENT AND CLOSURE WOUND;  Surgeon: Peggye Form, DO;  Location: [facility];  Service: Plastics;  Laterality: Left;  1 hour   IRRIGATION AND DEBRIDEMENT FOOT Left 02/08/2021   Procedure: IRRIGATION AND DEBRIDEMENT FOOT - LFT HEEL ULCER;  Surgeon: Gwyneth Revels, DPM;  Location: [facility];  Service: Podiatry;  Laterality: Left;   IRRIGATION AND DEBRIDEMENT FOOT Left 02/14/2021    Procedure: IRRIGATION AND DEBRIDEMENT LEFT HEEL;  Surgeon: Gwyneth Revels, DPM;  Location: [facility];  Service: Podiatry;  Laterality: Left;   kidney stone removal     KNEE ARTHROSCOPY W/ MENISCAL REPAIR     NOSE SURGERY  01/07/1989   due to fracture   TEE WITHOUT CARDIOVERSION N/A 09/26/2022   Procedure: TRANSESOPHAGEAL ECHOCARDIOGRAM;  Surgeon: Clotilde Dieter, DO;  Location: [facility];  Service: Cardiovascular;  Laterality: N/A;   TOE AMPUTATION     second toe   TONSILLECTOMY     TOTAL KNEE ARTHROPLASTY  01/11/2011   Procedure: TOTAL KNEE ARTHROPLASTY;  Surgeon: Harvie Junior;  Location: [facility];  Service: Orthopedics;  Laterality: Right;  COMPUTER ASSISTED TOTAL KNEE REPLACEMENT    Social History   Socioeconomic History   Marital status: Married    Spouse name: Not on file   Number of children: Not on file   Years of education: Not on file   Highest education level: Not on file  Occupational History   Not on file  Tobacco Use   Smoking status: Former    Current packs/day: 0.00    Types: Cigarettes    Quit date: 03/12/1985    Years since quitting: 37.6   Smokeless tobacco: Never  Vaping Use   Vaping status: Never Used  Substance and Sexual Activity   Alcohol use: No   Drug use: No   Sexual activity: Yes    Birth control/protection: Post-menopausal  Other Topics Concern   Not on file  Social History Narrative   Full time. Does not regularly exercise. Domestic partner.    Social Determinants of Health   Financial Resource Strain: Not on file  Food Insecurity: Not on file  Transportation Needs: Not on file  Physical Activity: Not on file  Stress: Not on file  Social Connections: Not on file  Intimate Partner Violence: Not on file    Family History  Problem Relation Age of Onset   Transient ischemic attack Mother    Asthma Mother    Hypertension Mother    Lung cancer Father    Hypertension Father    Asthma Sister    Anxiety disorder Sister    Stomach cancer  Maternal Grandmother    Colon cancer Maternal Grandmother    Leukemia Maternal Grandmother    Melanoma Maternal Grandfather    Asthma Sister    Hypertension Brother    Cancer Other    Crohn's disease Other    GER disease Other    Breast cancer Neg Hx    Allergies  Allergen Reactions   Shellfish Allergy Anaphylaxis   Codeine Other (See Comments)    Migraine  Other Reaction(s): Other (See Comments)  Reaction:  Severe migraines   Morphine And Codeine Other (See Comments)    Reaction:  Severe migraines    Percocet [Oxycodone-Acetaminophen] Nausea And Vomiting   Rosuvastatin     Other reaction(s): Muscle Pain   Sulfa Antibiotics Rash   Theophyllines Itching and  Rash   I? Current Outpatient Medications  Medication Sig Dispense Refill   acidophilus (RISAQUAD) CAPS capsule Take 1 capsule by mouth daily.     albuterol (PROVENTIL HFA;VENTOLIN HFA) 108 (90 Base) MCG/ACT inhaler Inhale 2 puffs into the lungs every 4 (four) hours as needed for wheezing or shortness of breath. Reported on 03/29/2015 1 Inhaler 3   albuterol (PROVENTIL) (2.5 MG/3ML) 0.083% nebulizer solution Take 2.5 mg by nebulization as needed.     ALPRAZolam (XANAX) 0.5 MG tablet TAKE 1/2 TO 1 TABLET BY MOUTH AS NEEDED FOR PANIC ATTACKS 30 tablet 3   amLODipine (NORVASC) 5 MG tablet Take 1 tablet (5 mg total) by mouth daily. 30 tablet 2   ascorbic acid (VITAMIN C) 500 MG tablet Take 1 tablet (500 mg total) by mouth 2 (two) times daily. 60 tablet 0   aspirin EC 81 MG tablet Take 81 mg by mouth daily.     aspirin-acetaminophen-caffeine (EXCEDRIN MIGRAINE) 250-250-65 MG tablet Take 2 tablets by mouth every 6 (six) hours as needed for migraine.     carvedilol (COREG) 6.25 MG tablet TAKE 1 TABLET BY MOUTH TWICE A DAY 180 tablet 1   ceFAZolin (ANCEF) IVPB Inject 2 g into the vein every 8 (eight) hours for 16 days. Indication:  MSSA bacteremia First Dose: Yes Last Day of Therapy:  10/16/2022 Labs - Once weekly:  CBC/D and  CMP Fax weekly lab results  promptly to [phone] Please pull PIC at completion of IV antibiotics Method of administration: IV Push Method of administration may be changed at the discretion of home infusion pharmacist based upon assessment of the patient and/or caregiver's ability to self-administer the medication ordered. 48 Units 0   Continuous Blood Gluc Sensor (FREESTYLE LIBRE 2 SENSOR) MISC APPLY ONE SENSOR EVERY 2 WEEKS.     desvenlafaxine (PRISTIQ) 100 MG 24 hr tablet TAKE 1 TABLET BY MOUTH EVERY DAY 90 tablet 1   Ensure Max Protein (ENSURE MAX PROTEIN) LIQD Take 330 mLs (11 oz total) by mouth 2 (two) times daily.     fenofibrate 54 MG tablet TAKE 1 TABLET BY MOUTH EVERY DAY 90 tablet 3   fluconazole (DIFLUCAN) 100 MG tablet Take one tablet by mouth on day 1 and second tablet by mouth on day 4 2 tablet 1   insulin NPH-regular Human (70-30) 100 UNIT/ML injection Inject 50 Units into the skin 2 (two) times daily with a meal.     iron polysaccharides (NIFEREX) 150 MG capsule Take 1 capsule (150 mg total) by mouth daily. 30 capsule 2   lisinopril (ZESTRIL) 5 MG tablet TAKE 1 TABLET BY MOUTH EVERY DAY (Patient taking differently: Take 5 mg by mouth 2 (two) times daily.) 90 tablet 1   metFORMIN (GLUCOPHAGE) 500 MG tablet TAKE TWO TABLETS EACH MORNING. AND 3 TABLETS IN THE EVENING AS DIRECTED 150 tablet 5   methocarbamol (ROBAXIN) 500 MG tablet Take 1 tablet (500 mg total) by mouth every 8 (eight) hours as needed for muscle spasms. 30 tablet 0   montelukast (SINGULAIR) 10 MG tablet TAKE 1 TABLET BY MOUTH EVERYDAY AT BEDTIME 90 tablet 1   Multiple Vitamin (MULTIVITAMIN WITH MINERALS) TABS tablet Take 1 tablet by mouth daily. 30 tablet 2   nystatin (MYCOSTATIN/NYSTOP) powder APPLY TOPICALLY TWICE DAILY AS NEEDED 30 g 3   ondansetron (ZOFRAN-ODT) 4 MG disintegrating tablet Take 4 mg by mouth every 8 (eight) hours as needed.     pantoprazole (PROTONIX) 40 MG tablet TAKE 1 TABLET BY  MOUTH EVERY  DAY 90 tablet 3   pravastatin (PRAVACHOL) 40 MG tablet TAKE 1 TABLET BY MOUTH EVERY DAY 30 tablet 5   traMADol (ULTRAM) 50 MG tablet Take 50 mg by mouth every 6 (six) hours as needed (for migraines).     zinc sulfate 220 (50 Zn) MG capsule Take 1 capsule (220 mg total) by mouth daily. 30 capsule 0   No current facility-administered medications for this visit.     Abtx:  Anti-infectives (From admission, onward)    None       REVIEW OF SYSTEMS:  Const: negative fever, negative chills, negative weight loss Eyes: negative diplopia or visual changes, negative eye pain ENT: negative coryza, negative sore throat Resp: negative cough, hemoptysis, dyspnea Cards: negative for chest pain, palpitations, lower extremity edema GU: negative for frequency, dysuria and hematuria GI: Negative for abdominal pain, diarrhea, bleeding, constipation Skin: negative for rash and pruritus Heme: negative for easy bruising and gum/nose bleeding MS: negative for myalgias, arthralgias, back pain and muscle weakness Neurolo:negative for headaches, dizziness, vertigo, memory problems  Psych:, depression  Endocrine:  diabetes Allergy/Immunology-as above ? Objective:  VITALS:  BP (!) 145/82   Pulse 69   Temp 97.7 F (36.5 C) (Temporal)   LMP 02/08/2013 (Within Months)  LDA Rt PICC PHYSICAL EXAM:  General: Alert, cooperative, no distress, appears stated age.  Head: Normocephalic, without obvious abnormality, atraumatic. Eyes: Conjunctivae clear, anicteric sclerae. Pupils are equal ENT Nares normal. No drainage or sinus tenderness. Lips, mucosa, and tongue normal. No Thrush Neck: Supple, symmetrical, no adenopathy, thyroid: non tender no carotid bruit and no JVD. Back: No CVA tenderness. Lungs: Clear to auscultation bilaterally. No Wheezing or Rhonchi. No rales. Heart: Regular rate and rhythm, no murmur, rub or gallop. Abdomen not examined Extremities:left BKA- site looks good      Small wound   due to pressure , is healing Skin: No rashes or lesions. Or bruising Lymph: Cervical, supraclavicular normal. Neurologic: Grossly non-focal Pertinent Labs Lab Results CBC    Component Value Date/Time   WBC 9.5 09/30/2022 0610   RBC 2.81 (L) 09/30/2022 0610   HGB 8.0 (L) 09/30/2022 0610   HGB 12.3 08/07/2022 1210   HCT 25.9 (L) 09/30/2022 0610   HCT 38.6 08/07/2022 1210   PLT 499 (H) 09/30/2022 0610   PLT 320 08/07/2022 1210   MCV 92.2 09/30/2022 0610   MCV 90 08/07/2022 1210   MCV 90 08/21/2012 1523   MCH 28.5 09/30/2022 0610   MCHC 30.9 09/30/2022 0610   RDW 15.4 09/30/2022 0610   RDW 11.8 08/07/2022 1210   RDW 13.4 08/21/2012 1523   LYMPHSABS 2.4 08/07/2022 1210   LYMPHSABS 2.7 06/05/2011 0550   MONOABS 0.9 02/13/2021 0323   MONOABS 0.4 06/05/2011 0550   EOSABS 0.5 (H) 08/07/2022 1210   EOSABS 0.6 06/05/2011 0550   BASOSABS 0.1 08/07/2022 1210   BASOSABS 0.1 06/05/2011 0550       Latest Ref Rng & Units 09/30/2022    6:10 AM 09/26/2022    2:25 AM 09/25/2022    2:36 AM  CMP  Glucose 70 - 99 mg/dL 366  440  347   BUN 6 - 20 mg/dL 30  24  20    Creatinine 0.44 - 1.00 mg/dL 4.25  9.56  3.87   Sodium 135 - 145 mmol/L 136  136  136   Potassium 3.5 - 5.1 mmol/L 4.3  4.6  5.0   Chloride 98 - 111 mmol/L 100  101  101   CO2 22 - 32 mmol/L 30  28  26    Calcium 8.9 - 10.3 mg/dL 8.9  8.1  8.5    ? Impression/Recommendation ?MSSA bacteremia- repeat culture neg TEE neg  Left foot gangrenous infection- Underwent BKA- it was the source of bacteremia She is on cefazolin 4 weeks totoal and will complete on 10/16/22  DM- on insulin Anemia improving  Follow with vascular ? ________________________________________________ Discussed with patient, and partner Follow PRN  Note:  This document was prepared using Dragon voice recognition software and may include unintentional dictation errors.

## 2022-10-10 NOTE — Telephone Encounter (Signed)
Shireen from [facility] called to let us know that the patient is requesting her OT assessment be moved to next week. Verbal okay given to [employer]. Just FYI.

## 2022-10-10 NOTE — Telephone Encounter (Signed)
Received on (08/06/2022) via of fax Order Status Notification from [facility] [employer]  Stating:ATTENTION,there are some issues delaying service to your patient.   [employer] will be contacting the patient with pricing options.  Their deductible has not yet been satisfied.  Thank you for your patience!//AB/CMA

## 2022-10-10 NOTE — Patient Instructions (Signed)
You are here for follow up of the left foot infection- s/p BKA- wound healing very well Will finish on 10/16/22- And PICC can be removed

## 2022-10-11 DIAGNOSIS — M1711 Unilateral primary osteoarthritis, right knee: Secondary | ICD-10-CM | POA: Diagnosis not present

## 2022-10-11 DIAGNOSIS — R32 Unspecified urinary incontinence: Secondary | ICD-10-CM | POA: Diagnosis not present

## 2022-10-11 DIAGNOSIS — D649 Anemia, unspecified: Secondary | ICD-10-CM | POA: Diagnosis not present

## 2022-10-11 DIAGNOSIS — E875 Hyperkalemia: Secondary | ICD-10-CM | POA: Diagnosis not present

## 2022-10-11 DIAGNOSIS — M869 Osteomyelitis, unspecified: Secondary | ICD-10-CM | POA: Diagnosis not present

## 2022-10-11 DIAGNOSIS — E871 Hypo-osmolality and hyponatremia: Secondary | ICD-10-CM | POA: Diagnosis not present

## 2022-10-11 DIAGNOSIS — Z794 Long term (current) use of insulin: Secondary | ICD-10-CM | POA: Diagnosis not present

## 2022-10-11 DIAGNOSIS — I509 Heart failure, unspecified: Secondary | ICD-10-CM | POA: Diagnosis not present

## 2022-10-11 DIAGNOSIS — R7881 Bacteremia: Secondary | ICD-10-CM | POA: Diagnosis not present

## 2022-10-11 DIAGNOSIS — Z89512 Acquired absence of left leg below knee: Secondary | ICD-10-CM | POA: Diagnosis not present

## 2022-10-11 DIAGNOSIS — L089 Local infection of the skin and subcutaneous tissue, unspecified: Secondary | ICD-10-CM | POA: Diagnosis not present

## 2022-10-11 DIAGNOSIS — E782 Mixed hyperlipidemia: Secondary | ICD-10-CM | POA: Diagnosis not present

## 2022-10-11 DIAGNOSIS — L309 Dermatitis, unspecified: Secondary | ICD-10-CM | POA: Diagnosis not present

## 2022-10-11 DIAGNOSIS — K219 Gastro-esophageal reflux disease without esophagitis: Secondary | ICD-10-CM | POA: Diagnosis not present

## 2022-10-11 DIAGNOSIS — I25119 Atherosclerotic heart disease of native coronary artery with unspecified angina pectoris: Secondary | ICD-10-CM | POA: Diagnosis not present

## 2022-10-11 DIAGNOSIS — E1169 Type 2 diabetes mellitus with other specified complication: Secondary | ICD-10-CM | POA: Diagnosis not present

## 2022-10-11 DIAGNOSIS — Z6841 Body Mass Index (BMI) 40.0 and over, adult: Secondary | ICD-10-CM | POA: Diagnosis not present

## 2022-10-11 DIAGNOSIS — E11621 Type 2 diabetes mellitus with foot ulcer: Secondary | ICD-10-CM | POA: Diagnosis not present

## 2022-10-11 DIAGNOSIS — J45909 Unspecified asthma, uncomplicated: Secondary | ICD-10-CM | POA: Diagnosis not present

## 2022-10-11 DIAGNOSIS — I429 Cardiomyopathy, unspecified: Secondary | ICD-10-CM | POA: Diagnosis not present

## 2022-10-11 DIAGNOSIS — E611 Iron deficiency: Secondary | ICD-10-CM | POA: Diagnosis not present

## 2022-10-11 DIAGNOSIS — L97509 Non-pressure chronic ulcer of other part of unspecified foot with unspecified severity: Secondary | ICD-10-CM | POA: Diagnosis not present

## 2022-10-11 DIAGNOSIS — F4323 Adjustment disorder with mixed anxiety and depressed mood: Secondary | ICD-10-CM | POA: Diagnosis not present

## 2022-10-11 DIAGNOSIS — I110 Hypertensive heart disease with heart failure: Secondary | ICD-10-CM | POA: Diagnosis not present

## 2022-10-11 DIAGNOSIS — Z4781 Encounter for orthopedic aftercare following surgical amputation: Secondary | ICD-10-CM | POA: Diagnosis not present

## 2022-10-11 DIAGNOSIS — E1140 Type 2 diabetes mellitus with diabetic neuropathy, unspecified: Secondary | ICD-10-CM | POA: Diagnosis not present

## 2022-10-11 DIAGNOSIS — B9561 Methicillin susceptible Staphylococcus aureus infection as the cause of diseases classified elsewhere: Secondary | ICD-10-CM | POA: Diagnosis not present

## 2022-10-11 DIAGNOSIS — G8929 Other chronic pain: Secondary | ICD-10-CM | POA: Diagnosis not present

## 2022-10-12 DIAGNOSIS — E11621 Type 2 diabetes mellitus with foot ulcer: Secondary | ICD-10-CM | POA: Diagnosis not present

## 2022-10-12 DIAGNOSIS — L089 Local infection of the skin and subcutaneous tissue, unspecified: Secondary | ICD-10-CM | POA: Diagnosis not present

## 2022-10-12 DIAGNOSIS — L97509 Non-pressure chronic ulcer of other part of unspecified foot with unspecified severity: Secondary | ICD-10-CM | POA: Diagnosis not present

## 2022-10-12 DIAGNOSIS — R7881 Bacteremia: Secondary | ICD-10-CM | POA: Diagnosis not present

## 2022-10-12 DIAGNOSIS — B9561 Methicillin susceptible Staphylococcus aureus infection as the cause of diseases classified elsewhere: Secondary | ICD-10-CM | POA: Diagnosis not present

## 2022-10-12 DIAGNOSIS — M869 Osteomyelitis, unspecified: Secondary | ICD-10-CM | POA: Diagnosis not present

## 2022-10-13 DIAGNOSIS — L97509 Non-pressure chronic ulcer of other part of unspecified foot with unspecified severity: Secondary | ICD-10-CM | POA: Diagnosis not present

## 2022-10-13 DIAGNOSIS — E11621 Type 2 diabetes mellitus with foot ulcer: Secondary | ICD-10-CM | POA: Diagnosis not present

## 2022-10-13 DIAGNOSIS — M869 Osteomyelitis, unspecified: Secondary | ICD-10-CM | POA: Diagnosis not present

## 2022-10-13 DIAGNOSIS — L089 Local infection of the skin and subcutaneous tissue, unspecified: Secondary | ICD-10-CM | POA: Diagnosis not present

## 2022-10-13 DIAGNOSIS — R7881 Bacteremia: Secondary | ICD-10-CM | POA: Diagnosis not present

## 2022-10-13 DIAGNOSIS — B9561 Methicillin susceptible Staphylococcus aureus infection as the cause of diseases classified elsewhere: Secondary | ICD-10-CM | POA: Diagnosis not present

## 2022-10-14 DIAGNOSIS — E11621 Type 2 diabetes mellitus with foot ulcer: Secondary | ICD-10-CM | POA: Diagnosis not present

## 2022-10-14 DIAGNOSIS — R7881 Bacteremia: Secondary | ICD-10-CM | POA: Diagnosis not present

## 2022-10-14 DIAGNOSIS — B9561 Methicillin susceptible Staphylococcus aureus infection as the cause of diseases classified elsewhere: Secondary | ICD-10-CM | POA: Diagnosis not present

## 2022-10-14 DIAGNOSIS — L089 Local infection of the skin and subcutaneous tissue, unspecified: Secondary | ICD-10-CM | POA: Diagnosis not present

## 2022-10-14 DIAGNOSIS — M869 Osteomyelitis, unspecified: Secondary | ICD-10-CM | POA: Diagnosis not present

## 2022-10-14 DIAGNOSIS — L97509 Non-pressure chronic ulcer of other part of unspecified foot with unspecified severity: Secondary | ICD-10-CM | POA: Diagnosis not present

## 2022-10-15 DIAGNOSIS — E875 Hyperkalemia: Secondary | ICD-10-CM | POA: Diagnosis not present

## 2022-10-15 DIAGNOSIS — K219 Gastro-esophageal reflux disease without esophagitis: Secondary | ICD-10-CM | POA: Diagnosis not present

## 2022-10-15 DIAGNOSIS — F4323 Adjustment disorder with mixed anxiety and depressed mood: Secondary | ICD-10-CM | POA: Diagnosis not present

## 2022-10-15 DIAGNOSIS — J45909 Unspecified asthma, uncomplicated: Secondary | ICD-10-CM | POA: Diagnosis not present

## 2022-10-15 DIAGNOSIS — R7881 Bacteremia: Secondary | ICD-10-CM | POA: Diagnosis not present

## 2022-10-15 DIAGNOSIS — E611 Iron deficiency: Secondary | ICD-10-CM | POA: Diagnosis not present

## 2022-10-15 DIAGNOSIS — L309 Dermatitis, unspecified: Secondary | ICD-10-CM | POA: Diagnosis not present

## 2022-10-15 DIAGNOSIS — E871 Hypo-osmolality and hyponatremia: Secondary | ICD-10-CM | POA: Diagnosis not present

## 2022-10-15 DIAGNOSIS — D649 Anemia, unspecified: Secondary | ICD-10-CM | POA: Diagnosis not present

## 2022-10-15 DIAGNOSIS — Z89512 Acquired absence of left leg below knee: Secondary | ICD-10-CM | POA: Diagnosis not present

## 2022-10-15 DIAGNOSIS — I429 Cardiomyopathy, unspecified: Secondary | ICD-10-CM | POA: Diagnosis not present

## 2022-10-15 DIAGNOSIS — Z4781 Encounter for orthopedic aftercare following surgical amputation: Secondary | ICD-10-CM | POA: Diagnosis not present

## 2022-10-15 DIAGNOSIS — M1711 Unilateral primary osteoarthritis, right knee: Secondary | ICD-10-CM | POA: Diagnosis not present

## 2022-10-15 DIAGNOSIS — Z6841 Body Mass Index (BMI) 40.0 and over, adult: Secondary | ICD-10-CM | POA: Diagnosis not present

## 2022-10-15 DIAGNOSIS — I25119 Atherosclerotic heart disease of native coronary artery with unspecified angina pectoris: Secondary | ICD-10-CM | POA: Diagnosis not present

## 2022-10-15 DIAGNOSIS — E1140 Type 2 diabetes mellitus with diabetic neuropathy, unspecified: Secondary | ICD-10-CM | POA: Diagnosis not present

## 2022-10-15 DIAGNOSIS — I509 Heart failure, unspecified: Secondary | ICD-10-CM | POA: Diagnosis not present

## 2022-10-15 DIAGNOSIS — Z794 Long term (current) use of insulin: Secondary | ICD-10-CM | POA: Diagnosis not present

## 2022-10-15 DIAGNOSIS — I110 Hypertensive heart disease with heart failure: Secondary | ICD-10-CM | POA: Diagnosis not present

## 2022-10-15 DIAGNOSIS — B9561 Methicillin susceptible Staphylococcus aureus infection as the cause of diseases classified elsewhere: Secondary | ICD-10-CM | POA: Diagnosis not present

## 2022-10-15 DIAGNOSIS — R32 Unspecified urinary incontinence: Secondary | ICD-10-CM | POA: Diagnosis not present

## 2022-10-15 DIAGNOSIS — M869 Osteomyelitis, unspecified: Secondary | ICD-10-CM | POA: Diagnosis not present

## 2022-10-15 DIAGNOSIS — E782 Mixed hyperlipidemia: Secondary | ICD-10-CM | POA: Diagnosis not present

## 2022-10-15 DIAGNOSIS — G8929 Other chronic pain: Secondary | ICD-10-CM | POA: Diagnosis not present

## 2022-10-15 DIAGNOSIS — L089 Local infection of the skin and subcutaneous tissue, unspecified: Secondary | ICD-10-CM | POA: Diagnosis not present

## 2022-10-15 DIAGNOSIS — E1169 Type 2 diabetes mellitus with other specified complication: Secondary | ICD-10-CM | POA: Diagnosis not present

## 2022-10-15 DIAGNOSIS — L97509 Non-pressure chronic ulcer of other part of unspecified foot with unspecified severity: Secondary | ICD-10-CM | POA: Diagnosis not present

## 2022-10-15 DIAGNOSIS — E11621 Type 2 diabetes mellitus with foot ulcer: Secondary | ICD-10-CM | POA: Diagnosis not present

## 2022-10-16 ENCOUNTER — Encounter: Payer: Self-pay | Admitting: Physical Medicine and Rehabilitation

## 2022-10-16 ENCOUNTER — Other Ambulatory Visit (INDEPENDENT_AMBULATORY_CARE_PROVIDER_SITE_OTHER): Payer: Self-pay | Admitting: Nurse Practitioner

## 2022-10-16 ENCOUNTER — Ambulatory Visit (INDEPENDENT_AMBULATORY_CARE_PROVIDER_SITE_OTHER): Payer: 59 | Admitting: Nurse Practitioner

## 2022-10-16 ENCOUNTER — Encounter (INDEPENDENT_AMBULATORY_CARE_PROVIDER_SITE_OTHER): Payer: Self-pay | Admitting: Nurse Practitioner

## 2022-10-16 VITALS — BP 136/69 | HR 79 | Resp 16

## 2022-10-16 DIAGNOSIS — Z7689 Persons encountering health services in other specified circumstances: Secondary | ICD-10-CM | POA: Diagnosis not present

## 2022-10-16 DIAGNOSIS — R7881 Bacteremia: Secondary | ICD-10-CM | POA: Diagnosis not present

## 2022-10-16 DIAGNOSIS — L97509 Non-pressure chronic ulcer of other part of unspecified foot with unspecified severity: Secondary | ICD-10-CM | POA: Diagnosis not present

## 2022-10-16 DIAGNOSIS — B9561 Methicillin susceptible Staphylococcus aureus infection as the cause of diseases classified elsewhere: Secondary | ICD-10-CM | POA: Diagnosis not present

## 2022-10-16 DIAGNOSIS — E11621 Type 2 diabetes mellitus with foot ulcer: Secondary | ICD-10-CM | POA: Diagnosis not present

## 2022-10-16 DIAGNOSIS — Z89612 Acquired absence of left leg above knee: Secondary | ICD-10-CM

## 2022-10-16 DIAGNOSIS — L089 Local infection of the skin and subcutaneous tissue, unspecified: Secondary | ICD-10-CM | POA: Diagnosis not present

## 2022-10-16 DIAGNOSIS — M869 Osteomyelitis, unspecified: Secondary | ICD-10-CM | POA: Diagnosis not present

## 2022-10-18 ENCOUNTER — Telehealth: Payer: Self-pay | Admitting: Family

## 2022-10-18 ENCOUNTER — Other Ambulatory Visit: Payer: Self-pay | Admitting: Family

## 2022-10-18 DIAGNOSIS — G8929 Other chronic pain: Secondary | ICD-10-CM | POA: Diagnosis not present

## 2022-10-18 DIAGNOSIS — E1169 Type 2 diabetes mellitus with other specified complication: Secondary | ICD-10-CM | POA: Diagnosis not present

## 2022-10-18 DIAGNOSIS — J45909 Unspecified asthma, uncomplicated: Secondary | ICD-10-CM | POA: Diagnosis not present

## 2022-10-18 DIAGNOSIS — I509 Heart failure, unspecified: Secondary | ICD-10-CM | POA: Diagnosis not present

## 2022-10-18 DIAGNOSIS — E875 Hyperkalemia: Secondary | ICD-10-CM | POA: Diagnosis not present

## 2022-10-18 DIAGNOSIS — E1140 Type 2 diabetes mellitus with diabetic neuropathy, unspecified: Secondary | ICD-10-CM | POA: Diagnosis not present

## 2022-10-18 DIAGNOSIS — D649 Anemia, unspecified: Secondary | ICD-10-CM | POA: Diagnosis not present

## 2022-10-18 DIAGNOSIS — E782 Mixed hyperlipidemia: Secondary | ICD-10-CM | POA: Diagnosis not present

## 2022-10-18 DIAGNOSIS — E611 Iron deficiency: Secondary | ICD-10-CM | POA: Diagnosis not present

## 2022-10-18 DIAGNOSIS — Z794 Long term (current) use of insulin: Secondary | ICD-10-CM | POA: Diagnosis not present

## 2022-10-18 DIAGNOSIS — K219 Gastro-esophageal reflux disease without esophagitis: Secondary | ICD-10-CM | POA: Diagnosis not present

## 2022-10-18 DIAGNOSIS — F4323 Adjustment disorder with mixed anxiety and depressed mood: Secondary | ICD-10-CM | POA: Diagnosis not present

## 2022-10-18 DIAGNOSIS — Z89512 Acquired absence of left leg below knee: Secondary | ICD-10-CM | POA: Diagnosis not present

## 2022-10-18 DIAGNOSIS — I110 Hypertensive heart disease with heart failure: Secondary | ICD-10-CM | POA: Diagnosis not present

## 2022-10-18 DIAGNOSIS — F411 Generalized anxiety disorder: Secondary | ICD-10-CM

## 2022-10-18 DIAGNOSIS — Z4781 Encounter for orthopedic aftercare following surgical amputation: Secondary | ICD-10-CM | POA: Diagnosis not present

## 2022-10-18 DIAGNOSIS — M869 Osteomyelitis, unspecified: Secondary | ICD-10-CM | POA: Diagnosis not present

## 2022-10-18 DIAGNOSIS — I25119 Atherosclerotic heart disease of native coronary artery with unspecified angina pectoris: Secondary | ICD-10-CM | POA: Diagnosis not present

## 2022-10-18 DIAGNOSIS — M1711 Unilateral primary osteoarthritis, right knee: Secondary | ICD-10-CM | POA: Diagnosis not present

## 2022-10-18 DIAGNOSIS — E871 Hypo-osmolality and hyponatremia: Secondary | ICD-10-CM | POA: Diagnosis not present

## 2022-10-18 DIAGNOSIS — R32 Unspecified urinary incontinence: Secondary | ICD-10-CM | POA: Diagnosis not present

## 2022-10-18 DIAGNOSIS — Z6841 Body Mass Index (BMI) 40.0 and over, adult: Secondary | ICD-10-CM | POA: Diagnosis not present

## 2022-10-18 DIAGNOSIS — I429 Cardiomyopathy, unspecified: Secondary | ICD-10-CM | POA: Diagnosis not present

## 2022-10-18 DIAGNOSIS — L309 Dermatitis, unspecified: Secondary | ICD-10-CM | POA: Diagnosis not present

## 2022-10-18 NOTE — Telephone Encounter (Signed)
Nancy [name] with [facility] called to let us know that the patient has now requested that they push out her OT eval to next week. Just FYI.

## 2022-10-20 LAB — AEROBIC CULTURE

## 2022-10-21 ENCOUNTER — Telehealth: Payer: Self-pay | Admitting: Family

## 2022-10-21 NOTE — Telephone Encounter (Signed)
Nancy [name] called in stating patient is vomiting and having trouble making a bowel movement. She is not hardly eating or drinking. Unable to hold down even fluids. Is there anything we can send in?  Taking Miralax and stool softener, took Zofran yesterday but not today. Advised to go ahead and take a Zofran to ease the nausea and be able to keep down fluids hopefully. Does she need to come in? Your soonest appt is Wed at 11:30.

## 2022-10-22 ENCOUNTER — Other Ambulatory Visit: Payer: Self-pay

## 2022-10-22 ENCOUNTER — Inpatient Hospital Stay
Admission: EM | Admit: 2022-10-22 | Discharge: 2022-10-30 | DRG: 754 | Disposition: A | Discharge status: Home / Self Care | Payer: 59 | Attending: Obstetrics and Gynecology | Admitting: Obstetrics and Gynecology

## 2022-10-22 ENCOUNTER — Emergency Department: Payer: 59

## 2022-10-22 DIAGNOSIS — R1032 Left lower quadrant pain: Secondary | ICD-10-CM | POA: Diagnosis not present

## 2022-10-22 DIAGNOSIS — R112 Nausea with vomiting, unspecified: Secondary | ICD-10-CM

## 2022-10-22 DIAGNOSIS — Z87891 Personal history of nicotine dependence: Secondary | ICD-10-CM | POA: Diagnosis not present

## 2022-10-22 DIAGNOSIS — Z89512 Acquired absence of left leg below knee: Secondary | ICD-10-CM

## 2022-10-22 DIAGNOSIS — K920 Hematemesis: Secondary | ICD-10-CM | POA: Diagnosis present

## 2022-10-22 DIAGNOSIS — I5181 Takotsubo syndrome: Secondary | ICD-10-CM | POA: Diagnosis present

## 2022-10-22 DIAGNOSIS — Z792 Long term (current) use of antibiotics: Secondary | ICD-10-CM

## 2022-10-22 DIAGNOSIS — Z7984 Long term (current) use of oral hypoglycemic drugs: Secondary | ICD-10-CM | POA: Diagnosis not present

## 2022-10-22 DIAGNOSIS — Z96653 Presence of artificial knee joint, bilateral: Secondary | ICD-10-CM | POA: Diagnosis present

## 2022-10-22 DIAGNOSIS — E119 Type 2 diabetes mellitus without complications: Secondary | ICD-10-CM | POA: Diagnosis present

## 2022-10-22 DIAGNOSIS — I10 Essential (primary) hypertension: Secondary | ICD-10-CM | POA: Diagnosis present

## 2022-10-22 DIAGNOSIS — Z825 Family history of asthma and other chronic lower respiratory diseases: Secondary | ICD-10-CM

## 2022-10-22 DIAGNOSIS — Z91013 Allergy to seafood: Secondary | ICD-10-CM | POA: Diagnosis not present

## 2022-10-22 DIAGNOSIS — C482 Malignant neoplasm of peritoneum, unspecified: Secondary | ICD-10-CM | POA: Diagnosis not present

## 2022-10-22 DIAGNOSIS — Z885 Allergy status to narcotic agent status: Secondary | ICD-10-CM | POA: Diagnosis not present

## 2022-10-22 DIAGNOSIS — Z8249 Family history of ischemic heart disease and other diseases of the circulatory system: Secondary | ICD-10-CM | POA: Diagnosis not present

## 2022-10-22 DIAGNOSIS — Z6839 Body mass index (BMI) 39.0-39.9, adult: Secondary | ICD-10-CM

## 2022-10-22 DIAGNOSIS — R509 Fever, unspecified: Secondary | ICD-10-CM

## 2022-10-22 DIAGNOSIS — C801 Malignant (primary) neoplasm, unspecified: Secondary | ICD-10-CM | POA: Diagnosis not present

## 2022-10-22 DIAGNOSIS — R1084 Generalized abdominal pain: Principal | ICD-10-CM

## 2022-10-22 DIAGNOSIS — J45909 Unspecified asthma, uncomplicated: Secondary | ICD-10-CM | POA: Diagnosis present

## 2022-10-22 DIAGNOSIS — D649 Anemia, unspecified: Secondary | ICD-10-CM

## 2022-10-22 DIAGNOSIS — N179 Acute kidney failure, unspecified: Secondary | ICD-10-CM

## 2022-10-22 DIAGNOSIS — Z888 Allergy status to other drugs, medicaments and biological substances status: Secondary | ICD-10-CM | POA: Diagnosis not present

## 2022-10-22 DIAGNOSIS — I2510 Atherosclerotic heart disease of native coronary artery without angina pectoris: Secondary | ICD-10-CM | POA: Diagnosis not present

## 2022-10-22 DIAGNOSIS — Z87898 Personal history of other specified conditions: Secondary | ICD-10-CM

## 2022-10-22 DIAGNOSIS — Z79899 Other long term (current) drug therapy: Secondary | ICD-10-CM

## 2022-10-22 DIAGNOSIS — Z7982 Long term (current) use of aspirin: Secondary | ICD-10-CM

## 2022-10-22 DIAGNOSIS — E782 Mixed hyperlipidemia: Secondary | ICD-10-CM | POA: Diagnosis present

## 2022-10-22 DIAGNOSIS — E869 Volume depletion, unspecified: Secondary | ICD-10-CM | POA: Diagnosis present

## 2022-10-22 DIAGNOSIS — R180 Malignant ascites: Principal | ICD-10-CM | POA: Diagnosis present

## 2022-10-22 DIAGNOSIS — F32A Depression, unspecified: Secondary | ICD-10-CM | POA: Diagnosis not present

## 2022-10-22 DIAGNOSIS — R197 Diarrhea, unspecified: Secondary | ICD-10-CM | POA: Diagnosis present

## 2022-10-22 DIAGNOSIS — Z882 Allergy status to sulfonamides status: Secondary | ICD-10-CM

## 2022-10-22 DIAGNOSIS — Z800 Family history of malignant neoplasm of digestive organs: Secondary | ICD-10-CM

## 2022-10-22 DIAGNOSIS — Z806 Family history of leukemia: Secondary | ICD-10-CM

## 2022-10-22 DIAGNOSIS — K219 Gastro-esophageal reflux disease without esophagitis: Secondary | ICD-10-CM | POA: Diagnosis present

## 2022-10-22 DIAGNOSIS — Z1152 Encounter for screening for COVID-19: Secondary | ICD-10-CM

## 2022-10-22 DIAGNOSIS — R1110 Vomiting, unspecified: Secondary | ICD-10-CM

## 2022-10-22 DIAGNOSIS — R188 Other ascites: Secondary | ICD-10-CM

## 2022-10-22 DIAGNOSIS — E66813 Obesity, class 3: Secondary | ICD-10-CM | POA: Diagnosis present

## 2022-10-22 DIAGNOSIS — F410 Panic disorder [episodic paroxysmal anxiety] without agoraphobia: Secondary | ICD-10-CM | POA: Diagnosis present

## 2022-10-22 DIAGNOSIS — Z8041 Family history of malignant neoplasm of ovary: Secondary | ICD-10-CM

## 2022-10-22 DIAGNOSIS — C786 Secondary malignant neoplasm of retroperitoneum and peritoneum: Secondary | ICD-10-CM | POA: Diagnosis not present

## 2022-10-22 DIAGNOSIS — Z801 Family history of malignant neoplasm of trachea, bronchus and lung: Secondary | ICD-10-CM

## 2022-10-22 DIAGNOSIS — N170 Acute kidney failure with tubular necrosis: Secondary | ICD-10-CM | POA: Diagnosis present

## 2022-10-22 DIAGNOSIS — E1165 Type 2 diabetes mellitus with hyperglycemia: Secondary | ICD-10-CM | POA: Diagnosis not present

## 2022-10-22 DIAGNOSIS — C569 Malignant neoplasm of unspecified ovary: Principal | ICD-10-CM | POA: Clinically undetermined

## 2022-10-22 DIAGNOSIS — Z794 Long term (current) use of insulin: Secondary | ICD-10-CM | POA: Diagnosis not present

## 2022-10-22 DIAGNOSIS — I959 Hypotension, unspecified: Secondary | ICD-10-CM

## 2022-10-22 DIAGNOSIS — R6510 Systemic inflammatory response syndrome (SIRS) of non-infectious origin without acute organ dysfunction: Secondary | ICD-10-CM | POA: Diagnosis present

## 2022-10-22 DIAGNOSIS — Z808 Family history of malignant neoplasm of other organs or systems: Secondary | ICD-10-CM

## 2022-10-22 DIAGNOSIS — G8929 Other chronic pain: Secondary | ICD-10-CM | POA: Diagnosis present

## 2022-10-22 HISTORY — DX: Acute kidney failure, unspecified: N17.9

## 2022-10-22 HISTORY — DX: Personal history of other specified conditions: Z87.898

## 2022-10-22 LAB — CBC WITH DIFFERENTIAL/PLATELET
Abs Immature Granulocytes: 0.07 10*3/uL (ref 0.00–0.07)
Basophils Absolute: 0.1 10*3/uL (ref 0.0–0.1)
Basophils Relative: 1 %
Eosinophils Absolute: 0.2 10*3/uL (ref 0.0–0.5)
Eosinophils Relative: 2 %
HCT: 31.4 % — ABNORMAL LOW (ref 36.0–46.0)
Hemoglobin: 9.8 g/dL — ABNORMAL LOW (ref 12.0–15.0)
Immature Granulocytes: 1 %
Lymphocytes Relative: 15 %
Lymphs Abs: 1.7 10*3/uL (ref 0.7–4.0)
MCH: 28.5 pg (ref 26.0–34.0)
MCHC: 31.2 g/dL (ref 30.0–36.0)
MCV: 91.3 fL (ref 80.0–100.0)
Monocytes Absolute: 0.9 10*3/uL (ref 0.1–1.0)
Monocytes Relative: 7 %
Neutro Abs: 8.7 10*3/uL — ABNORMAL HIGH (ref 1.7–7.7)
Neutrophils Relative %: 74 %
Platelets: 552 10*3/uL — ABNORMAL HIGH (ref 150–400)
RBC: 3.44 MIL/uL — ABNORMAL LOW (ref 3.87–5.11)
RDW: 14.1 % (ref 11.5–15.5)
WBC: 11.7 10*3/uL — ABNORMAL HIGH (ref 4.0–10.5)
nRBC: 0 % (ref 0.0–0.2)

## 2022-10-22 LAB — URINALYSIS, W/ REFLEX TO CULTURE (INFECTION SUSPECTED)
Bacteria, UA: NONE SEEN
Bilirubin Urine: NEGATIVE
Glucose, UA: NEGATIVE mg/dL
Ketones, ur: NEGATIVE mg/dL
Leukocytes,Ua: NEGATIVE
Nitrite: NEGATIVE
Protein, ur: NEGATIVE mg/dL
Specific Gravity, Urine: 1.026 (ref 1.005–1.030)
pH: 5 (ref 5.0–8.0)

## 2022-10-22 LAB — COMPREHENSIVE METABOLIC PANEL
ALT: 7 U/L (ref 0–44)
AST: 14 U/L — ABNORMAL LOW (ref 15–41)
Albumin: 3 g/dL — ABNORMAL LOW (ref 3.5–5.0)
Alkaline Phosphatase: 56 U/L (ref 38–126)
Anion gap: 12 (ref 5–15)
BUN: 54 mg/dL — ABNORMAL HIGH (ref 6–20)
CO2: 24 mmol/L (ref 22–32)
Calcium: 8.8 mg/dL — ABNORMAL LOW (ref 8.9–10.3)
Chloride: 97 mmol/L — ABNORMAL LOW (ref 98–111)
Creatinine, Ser: 1.19 mg/dL — ABNORMAL HIGH (ref 0.44–1.00)
GFR, Estimated: 53 mL/min — ABNORMAL LOW (ref >60)
Glucose, Bld: 125 mg/dL — ABNORMAL HIGH (ref 70–99)
Potassium: 5.1 mmol/L (ref 3.5–5.1)
Sodium: 133 mmol/L — ABNORMAL LOW (ref 135–145)
Total Bilirubin: 0.6 mg/dL (ref 0.3–1.2)
Total Protein: 8.3 g/dL — ABNORMAL HIGH (ref 6.5–8.1)

## 2022-10-22 LAB — RESP PANEL BY RT-PCR (RSV, FLU A&B, COVID)  RVPGX2
Influenza A by PCR: NEGATIVE
Influenza B by PCR: NEGATIVE
Resp Syncytial Virus by PCR: NEGATIVE
SARS Coronavirus 2 by RT PCR: NEGATIVE

## 2022-10-22 LAB — LIPASE, BLOOD: Lipase: 23 U/L (ref 11–51)

## 2022-10-22 LAB — LACTIC ACID, PLASMA: Lactic Acid, Venous: 1 mmol/L (ref 0.5–1.9)

## 2022-10-22 LAB — PROCALCITONIN: Procalcitonin: 0.12 ng/mL

## 2022-10-22 LAB — GLUCOSE, CAPILLARY: Glucose-Capillary: 92 mg/dL (ref 70–99)

## 2022-10-22 MED ORDER — ACETAMINOPHEN 325 MG PO TABS
650.0000 mg | ORAL_TABLET | Freq: Four times a day (QID) | ORAL | Status: DC | PRN
  Administered 2022-10-28: 650 mg via ORAL
  Filled 2022-10-22: qty 2

## 2022-10-22 MED ORDER — HYDROMORPHONE HCL 1 MG/ML IJ SOLN
1.0000 mg | INTRAMUSCULAR | Status: DC | PRN
  Administered 2022-10-22 – 2022-10-28 (×39): 1 mg via INTRAVENOUS
  Filled 2022-10-22 (×39): qty 1

## 2022-10-22 MED ORDER — ACETAMINOPHEN 650 MG RE SUPP: 650.0000 mg | Freq: Four times a day (QID) | RECTAL | Status: DC | PRN

## 2022-10-22 MED ORDER — SODIUM CHLORIDE 0.9 % IV SOLN
2.0000 g | Freq: Once | INTRAVENOUS | Status: AC
  Administered 2022-10-22: 2 g via INTRAVENOUS
  Filled 2022-10-22: qty 20

## 2022-10-22 MED ORDER — LACTATED RINGERS IV BOLUS
1000.0000 mL | Freq: Once | INTRAVENOUS | Status: AC
  Administered 2022-10-22: 1000 mL via INTRAVENOUS

## 2022-10-22 MED ORDER — SODIUM CHLORIDE 0.9 % IV SOLN
2.0000 g | INTRAVENOUS | Status: DC
  Administered 2022-10-23: 2 g via INTRAVENOUS
  Filled 2022-10-22: qty 20

## 2022-10-22 MED ORDER — ONDANSETRON HCL 4 MG/2ML IJ SOLN
4.0000 mg | Freq: Four times a day (QID) | INTRAMUSCULAR | Status: DC | PRN
  Administered 2022-10-23 – 2022-10-30 (×10): 4 mg via INTRAVENOUS
  Filled 2022-10-22 (×10): qty 2

## 2022-10-22 MED ORDER — FENTANYL CITRATE PF 50 MCG/ML IJ SOSY
50.0000 ug | PREFILLED_SYRINGE | Freq: Once | INTRAMUSCULAR | Status: AC
  Administered 2022-10-22: 50 ug via INTRAVENOUS
  Filled 2022-10-22: qty 1

## 2022-10-22 MED ORDER — KCL IN DEXTROSE-NACL 20-5-0.9 MEQ/L-%-% IV SOLN
INTRAVENOUS | Status: DC
  Filled 2022-10-22 (×2): qty 1000

## 2022-10-22 MED ORDER — ONDANSETRON HCL 4 MG/2ML IJ SOLN
4.0000 mg | Freq: Once | INTRAMUSCULAR | Status: AC
  Administered 2022-10-22: 4 mg via INTRAVENOUS
  Filled 2022-10-22: qty 2

## 2022-10-22 MED ORDER — VENLAFAXINE HCL ER 150 MG PO CP24
150.0000 mg | ORAL_CAPSULE | Freq: Every day | ORAL | Status: DC
  Administered 2022-10-22 – 2022-10-29 (×8): 150 mg via ORAL
  Filled 2022-10-22: qty 1
  Filled 2022-10-22 (×3): qty 2
  Filled 2022-10-22: qty 1
  Filled 2022-10-22 (×2): qty 2
  Filled 2022-10-22 (×2): qty 1
  Filled 2022-10-22 (×2): qty 2
  Filled 2022-10-22 (×4): qty 1

## 2022-10-22 MED ORDER — ALPRAZOLAM 0.5 MG PO TABS
0.5000 mg | ORAL_TABLET | Freq: Three times a day (TID) | ORAL | Status: DC | PRN
  Administered 2022-10-24 – 2022-10-27 (×4): 0.5 mg via ORAL
  Filled 2022-10-22 (×4): qty 1

## 2022-10-22 MED ORDER — MONTELUKAST SODIUM 10 MG PO TABS
10.0000 mg | ORAL_TABLET | Freq: Every day | ORAL | Status: DC
  Administered 2022-10-23 – 2022-10-29 (×7): 10 mg via ORAL
  Filled 2022-10-22 (×7): qty 1

## 2022-10-22 MED ORDER — INSULIN ASPART 100 UNIT/ML IJ SOLN
0.0000 [IU] | INTRAMUSCULAR | Status: DC
  Administered 2022-10-23 – 2022-10-24 (×5): 3 [IU] via SUBCUTANEOUS
  Administered 2022-10-25: 4 [IU] via SUBCUTANEOUS
  Administered 2022-10-25: 3 [IU] via SUBCUTANEOUS
  Administered 2022-10-25 (×2): 4 [IU] via SUBCUTANEOUS
  Administered 2022-10-25 (×2): 3 [IU] via SUBCUTANEOUS
  Administered 2022-10-26 (×2): 4 [IU] via SUBCUTANEOUS
  Administered 2022-10-26: 3 [IU] via SUBCUTANEOUS
  Administered 2022-10-26 – 2022-10-27 (×2): 4 [IU] via SUBCUTANEOUS
  Administered 2022-10-27: 2 [IU] via SUBCUTANEOUS
  Administered 2022-10-27: 4 [IU] via SUBCUTANEOUS
  Administered 2022-10-27 (×3): 3 [IU] via SUBCUTANEOUS
  Administered 2022-10-28 (×3): 4 [IU] via SUBCUTANEOUS
  Administered 2022-10-28: 3 [IU] via SUBCUTANEOUS
  Administered 2022-10-28: 4 [IU] via SUBCUTANEOUS
  Administered 2022-10-28 – 2022-10-29 (×2): 3 [IU] via SUBCUTANEOUS
  Administered 2022-10-29: 4 [IU] via SUBCUTANEOUS
  Administered 2022-10-29: 3 [IU] via SUBCUTANEOUS
  Administered 2022-10-29 (×3): 4 [IU] via SUBCUTANEOUS
  Administered 2022-10-30 (×4): 3 [IU] via SUBCUTANEOUS
  Filled 2022-10-22 (×37): qty 1

## 2022-10-22 MED ORDER — ALBUTEROL SULFATE (2.5 MG/3ML) 0.083% IN NEBU: 2.5000 mg | INHALATION_SOLUTION | RESPIRATORY_TRACT | Status: DC | PRN

## 2022-10-22 MED ORDER — ONDANSETRON HCL 4 MG PO TABS
4.0000 mg | ORAL_TABLET | Freq: Four times a day (QID) | ORAL | Status: DC | PRN
  Administered 2022-10-25: 4 mg via ORAL
  Filled 2022-10-22: qty 1

## 2022-10-22 MED ORDER — SODIUM CHLORIDE 0.9 % IV SOLN
25.0000 mg | Freq: Four times a day (QID) | INTRAVENOUS | Status: DC | PRN
  Filled 2022-10-22: qty 1

## 2022-10-22 MED ORDER — IOHEXOL 300 MG/ML  SOLN
100.0000 mL | Freq: Once | INTRAMUSCULAR | Status: AC | PRN
  Administered 2022-10-22: 100 mL via INTRAVENOUS

## 2022-10-22 MED ORDER — PANTOPRAZOLE SODIUM 40 MG IV SOLR: 40.0000 mg | INTRAVENOUS | Status: DC

## 2022-10-22 MED ORDER — PANTOPRAZOLE SODIUM 40 MG IV SOLR
40.0000 mg | Freq: Once | INTRAVENOUS | Status: AC
  Administered 2022-10-22: 40 mg via INTRAVENOUS
  Filled 2022-10-22: qty 10

## 2022-10-22 NOTE — H&P (Signed)
History and Physical    Patient: Nancy [name] BJY:[id number] DOB: 27-Jul-1965 DOA: 10/22/2022 DOS: the patient was seen and examined on 10/22/2022 PCP: Miki Kins, FNP  Patient coming from: Home  Chief Complaint:  Chief Complaint  Patient presents with   Abdominal Pain    HPI: Nancy [name] is a 57 y.o. female with medical history significant for type 2 diabetes, CAD, asthma, chronic pain, diabetic foot infection, GERD, hyperlipidemia, Takotsubo cardiomyopathy hospitalized from 9/11 to 09/30/2022 with sepsis secondary to diabetic foot infection/osteomyelitis s/p left BKA 9/17, also found to have Staph aureus bacteremia with TEE negative for vegetations, discharged home with 2 weeks of IV cefazolin who presents to the ED with a 6-day history of nonbloody nonbilious vomiting Associated with generalized abdominal pain and distention.  She is also having intermittent loose stool and on arrival to the ED had a temperature of 100.9.  She denies cough, chest pain, shortness of breath, leg pain, black or bloody stool, dysuria. ED course and data review: Tmax 100.9 with pulse 91, BP lowest 108/54, improving with fluids to 112/33. Labs: WBC 11,000 with lactic acid 1.0 Hemoglobin 9.8, baseline Creatinine 1.19 up from baseline of 0.59. Respiratory viral panel negative for COVID flu and RSV and urinalysis unremarkable. CT abdomen and pelvis concerning for large volume ascites with peritoneal carcinomatosis as detailed below: IMPRESSION: Large volume ascites in the abdomen or pelvis with omental caking. Appearance is concerning for peritoneal malignancy/carcinomatosis, often seen with ovarian cancer, but no ovarian mass visualized.  Patient treated with an LR bolus and antiemetics, IV pain meds Hospitalist consulted for admission.   Review of Systems: As mentioned in the history of present illness. All other systems reviewed and are negative.  Past Medical History:  Diagnosis  Date   Abnormal cardiovascular stress test 09/21/2018   Formatting of this note might be different from the original. Lexiscan Myoview 09/16/2018 revealed mild anterior ischemia   Adverse effect of motion 05/10/2014   Anxiety    Arthritis    r knee   Asthma    Breast cyst 05/10/2014   CAD (coronary artery disease)    a.) LHC 06/04/2009 at [facility]; non-obstructive CAD. b.) CTA with FFR 10/08/2018: extensive mixed plaque proximal to mid LAD (51-69%); Coronary Ca score 217; FFR 0.71 dPDA, 0.86 mLAD, 0.87 dLCx.   CCF (congestive cardiac failure) (HCC) 06/06/2008   a.) 30% EF. b.) TTE 06/03/2011: EF >55%; triv MR. c.) TTE 04/27/2018: EF 55%; mild LVH; triv PR, mild MR/TR; G1DD.   Chest pain with high risk for cardiac etiology 07/02/2016   Chronic use of opiate drug for therapeutic purpose 02/13/2022   Formatting of this note might be different from the original. [facility] Contract signed on 04/18/16 & updated 03/04/17; UDS done on 04/18/16.   Complication of anesthesia    Diabetic foot infection (HCC) 03/13/2015   Diabetic foot ulcer (HCC) 02/08/2021   Eczema    Family history of adverse reaction to anesthesia    a.) PONV in mother and grandmother   GERD (gastroesophageal reflux disease) 05/10/2014   History of kidney stones    HLD (hyperlipidemia)    Hyponatremia 03/13/2015   Migraines    Motion sickness    Other acute osteomyelitis, left ankle and foot (HCC) 02/13/2022   Panic attacks    Peripheral edema 04/27/2018   Pneumonia    PONV (postoperative nausea and vomiting)    Sepsis secondary to diabetic foot infection 03/13/2015   T2DM (type 2 diabetes mellitus) (  HCC)    Takotsubo cardiomyopathy / transient apical balooning syndrome / stress-induced cardiomyopathy 05/10/2014   Unspecified essential hypertension    Past Surgical History:  Procedure Laterality Date   AMPUTATION Left 09/19/2022   Procedure: AMPUTATION BELOW KNEE;  Surgeon: Renford Dills, MD;  Location: [facility];  Service:  Vascular;  Laterality: Left;   AMPUTATION Left 09/24/2022   Procedure: AMPUTATION BELOW KNEE WITH WOUND CLOSURE;  Surgeon: Renford Dills, MD;  Location: [facility];  Service: Vascular;  Laterality: Left;   AMPUTATION TOE Left 03/16/2015   Procedure: left fifth toe amputation with incision and drainage;  Surgeon: Gwyneth Revels, DPM;  Location: [facility];  Service: Podiatry;  Laterality: Left;   APPLICATION OF WOUND VAC Left 02/14/2021   Procedure: APPLICATION OF WOUND VAC;  Surgeon: Gwyneth Revels, DPM;  Location: [facility];  Service: Podiatry;  Laterality: Left;   APPLICATION OF WOUND VAC Left 04/16/2021   Procedure: APPLICATION OF WOUND VAC;  Surgeon: Peggye Form, DO;  Location: [facility];  Service: Plastics;  Laterality: Left;   CARDIAC CATHETERIZATION Left 06/04/2009   Procedure: CARDIAC CATHETERIZATION; Location: [facility]   CHOLECYSTECTOMY     DEBRIDEMENT AND CLOSURE WOUND Left 04/16/2021   Procedure: DEBRIDEMENT AND CLOSURE WOUND;  Surgeon: Peggye Form, DO;  Location: [facility];  Service: Plastics;  Laterality: Left;  1 hour   IRRIGATION AND DEBRIDEMENT FOOT Left 02/08/2021   Procedure: IRRIGATION AND DEBRIDEMENT FOOT - LFT HEEL ULCER;  Surgeon: Gwyneth Revels, DPM;  Location: [facility];  Service: Podiatry;  Laterality: Left;   IRRIGATION AND DEBRIDEMENT FOOT Left 02/14/2021   Procedure: IRRIGATION AND DEBRIDEMENT LEFT HEEL;  Surgeon: Gwyneth Revels, DPM;  Location: [facility];  Service: Podiatry;  Laterality: Left;   kidney stone removal     KNEE ARTHROSCOPY W/ MENISCAL REPAIR     NOSE SURGERY  01/07/1989   due to fracture   TEE WITHOUT CARDIOVERSION N/A 09/26/2022   Procedure: TRANSESOPHAGEAL ECHOCARDIOGRAM;  Surgeon: Clotilde Dieter, DO;  Location: [facility];  Service: Cardiovascular;  Laterality: N/A;   TOE AMPUTATION     second toe   TONSILLECTOMY     TOTAL KNEE ARTHROPLASTY  01/11/2011   Procedure: TOTAL KNEE ARTHROPLASTY;   Surgeon: Harvie Junior;  Location: [facility];  Service: Orthopedics;  Laterality: Right;  COMPUTER ASSISTED TOTAL KNEE REPLACEMENT   Social History:  reports that she quit smoking about 37 years ago. Her smoking use included cigarettes. She has never used smokeless tobacco. She reports that she does not drink alcohol and does not use drugs.  Allergies  Allergen Reactions   Shellfish Allergy Anaphylaxis   Codeine Other (See Comments)    Migraine  Other Reaction(s): Other (See Comments)  Reaction:  Severe migraines   Morphine And Codeine Other (See Comments)    Reaction:  Severe migraines    Percocet [Oxycodone-Acetaminophen] Nausea And Vomiting   Rosuvastatin     Other reaction(s): Muscle Pain   Sulfa Antibiotics Rash   Theophyllines Itching and Rash    Family History  Problem Relation Age of Onset   Transient ischemic attack Mother    Asthma Mother    Hypertension Mother    Lung cancer Father    Hypertension Father    Asthma Sister    Anxiety disorder Sister    Stomach cancer Maternal Grandmother    Colon cancer Maternal Grandmother    Leukemia Maternal Grandmother    Melanoma Maternal Grandfather  Asthma Sister    Hypertension Brother    Cancer Other    Crohn's disease Other    GER disease Other    Breast cancer Neg Hx     Prior to Admission medications   Medication Sig Start Date End Date Taking? Authorizing Provider  acidophilus (RISAQUAD) CAPS capsule Take 1 capsule by mouth daily.    [provider]  albuterol (PROVENTIL HFA;VENTOLIN HFA) 108 (90 Base) MCG/ACT inhaler Inhale 2 puffs into the lungs every 4 (four) hours as needed for wheezing or shortness of breath. Reported on 03/29/2015 03/29/15   Lorie Phenix, MD  albuterol (PROVENTIL) (2.5 MG/3ML) 0.083% nebulizer solution Take 2.5 mg by nebulization as needed. 12/20/21   [provider]  ALPRAZolam Prudy Feeler) 0.5 MG tablet TAKE 1/2 TO 1 TABLET BY MOUTH AS NEEDED FOR PANIC ATTACKS 06/25/22   Miki Kins, FNP  amLODipine (NORVASC) 5 MG tablet Take 1 tablet (5 mg total) by mouth daily. 10/01/22   Marcelino Duster, MD  ascorbic acid (VITAMIN C) 500 MG tablet Take 1 tablet (500 mg total) by mouth 2 (two) times daily. 02/16/21   Glade Lloyd, MD  aspirin EC 81 MG tablet Take 81 mg by mouth daily.    [provider]  aspirin-acetaminophen-caffeine (EXCEDRIN MIGRAINE) [phone] MG tablet Take 2 tablets by mouth every 6 (six) hours as needed for migraine.    [provider]  carvedilol (COREG) 6.25 MG tablet TAKE 1 TABLET BY MOUTH TWICE A DAY 08/19/22   Miki Kins, FNP  Continuous Blood Gluc Sensor (FREESTYLE LIBRE 2 SENSOR) MISC APPLY ONE SENSOR EVERY 2 WEEKS. 03/07/21   [provider]  desvenlafaxine (PRISTIQ) 100 MG 24 hr tablet TAKE 1 TABLET BY MOUTH EVERY DAY 10/18/22   Miki Kins, FNP  Ensure Max Protein (ENSURE MAX PROTEIN) LIQD Take 330 mLs (11 oz total) by mouth 2 (two) times daily. 09/30/22   Marcelino Duster, MD  fenofibrate 54 MG tablet TAKE 1 TABLET BY MOUTH EVERY DAY 03/11/22   Miki Kins, FNP  fluconazole (DIFLUCAN) 100 MG tablet Take one tablet by mouth on day 1 and second tablet by mouth on day 4 07/19/22   Miki Kins, FNP  insulin NPH-regular Human (70-30) 100 UNIT/ML injection Inject 50 Units into the skin 2 (two) times daily with a meal.    [provider]  iron polysaccharides (NIFEREX) 150 MG capsule Take 1 capsule (150 mg total) by mouth daily. 10/01/22   Marcelino Duster, MD  lisinopril (ZESTRIL) 5 MG tablet TAKE 1 TABLET BY MOUTH EVERY DAY Patient taking differently: Take 5 mg by mouth 2 (two) times daily. 05/24/22   Miki Kins, FNP  metFORMIN (GLUCOPHAGE) 500 MG tablet TAKE TWO TABLETS EACH MORNING. AND 3 TABLETS IN THE EVENING AS DIRECTED 09/17/22   Miki Kins, FNP  methocarbamol (ROBAXIN) 500 MG tablet Take 1 tablet (500 mg total) by mouth every 8 (eight) hours as needed for muscle  spasms. 09/30/22   Marcelino Duster, MD  montelukast (SINGULAIR) 10 MG tablet TAKE 1 TABLET BY MOUTH EVERYDAY AT BEDTIME 09/17/22   Miki Kins, FNP  Multiple Vitamin (MULTIVITAMIN WITH MINERALS) TABS tablet Take 1 tablet by mouth daily. 10/01/22   Marcelino Duster, MD  nystatin (MYCOSTATIN/NYSTOP) powder APPLY TOPICALLY TWICE DAILY AS NEEDED 04/01/22   Miki Kins, FNP  ondansetron (ZOFRAN-ODT) 4 MG disintegrating tablet Take 4 mg by mouth every 8 (eight) hours as needed. 02/20/21   [provider]  pantoprazole (PROTONIX) 40 MG tablet TAKE 1 TABLET BY MOUTH EVERY DAY 03/12/22   Miki Kins, FNP  pravastatin (PRAVACHOL) 40 MG tablet TAKE 1 TABLET BY MOUTH EVERY DAY 08/23/22   Miki Kins, FNP  traMADol (ULTRAM) 50 MG tablet Take 50 mg by mouth every 6 (six) hours as needed (for migraines).    [provider]  zinc sulfate 220 (50 Zn) MG capsule Take 1 capsule (220 mg total) by mouth daily. 02/17/21   Glade Lloyd, MD    Physical Exam: Vitals:   10/22/22 1451 10/22/22 1452 10/22/22 1600 10/22/22 1830  BP: 139/61  (!) 108/54 (!) 112/33  Pulse:  91 79 81  Resp: 20  19 15   Temp:   (!) 100.7 F (38.2 C) 99.7 F (37.6 C)  TempSrc:   Oral Oral  SpO2:  94% 99% 97%  Weight: 122 kg     Height: 5\' 9"  (1.753 m)      Physical Exam  Labs on Admission: I have personally reviewed following labs and imaging studies  CBC: Recent Labs  Lab 10/22/22 1526  WBC 11.7*  NEUTROABS 8.7*  HGB 9.8*  HCT 31.4*  MCV 91.3  PLT 552*   Basic Metabolic Panel: Recent Labs  Lab 10/22/22 1526  NA 133*  K 5.1  CL 97*  CO2 24  GLUCOSE 125*  BUN 54*  CREATININE 1.19*  CALCIUM 8.8*   GFR: Estimated Creatinine Clearance: 72.9 mL/min (A) (by C-G formula based on SCr of 1.19 mg/dL (H)). Liver Function Tests: Recent Labs  Lab 10/22/22 1526  AST 14*  ALT 7  ALKPHOS 56  BILITOT 0.6  PROT 8.3*  ALBUMIN 3.0*   No results for input(s): "LIPASE", "AMYLASE"  in the last 168 hours. No results for input(s): "AMMONIA" in the last 168 hours. Coagulation Profile: No results for input(s): "INR", "PROTIME" in the last 168 hours. Cardiac Enzymes: No results for input(s): "CKTOTAL", "CKMB", "CKMBINDEX", "TROPONINI" in the last 168 hours. BNP (last 3 results) No results for input(s): "PROBNP" in the last 8760 hours. HbA1C: No results for input(s): "HGBA1C" in the last 72 hours. CBG: No results for input(s): "GLUCAP" in the last 168 hours. Lipid Profile: No results for input(s): "CHOL", "HDL", "LDLCALC", "TRIG", "CHOLHDL", "LDLDIRECT" in the last 72 hours. Thyroid Function Tests: No results for input(s): "TSH", "T4TOTAL", "FREET4", "T3FREE", "THYROIDAB" in the last 72 hours. Anemia Panel: No results for input(s): "VITAMINB12", "FOLATE", "FERRITIN", "TIBC", "IRON", "RETICCTPCT" in the last 72 hours. Urine analysis:    Component Value Date/Time   COLORURINE YELLOW (A) 10/22/2022 1526   APPEARANCEUR HAZY (A) 10/22/2022 1526   LABSPEC 1.026 10/22/2022 1526   PHURINE 5.0 10/22/2022 1526   GLUCOSEU NEGATIVE 10/22/2022 1526   HGBUR SMALL (A) 10/22/2022 1526   BILIRUBINUR NEGATIVE 10/22/2022 1526   BILIRUBINUR Negative 09/04/2022 1119   KETONESUR NEGATIVE 10/22/2022 1526   PROTEINUR NEGATIVE 10/22/2022 1526   UROBILINOGEN 1.0 09/04/2022 1119   UROBILINOGEN 0.2 01/03/2011 1138   NITRITE NEGATIVE 10/22/2022 1526   LEUKOCYTESUR NEGATIVE 10/22/2022 1526    Radiological Exams on Admission: CT ABDOMEN PELVIS W CONTRAST  Result Date: 10/22/2022 CLINICAL DATA:  Abdominal pain, most pronounced in the left lower quadrant and suprapubic regions. Vomiting, fever. EXAM: CT ABDOMEN AND PELVIS WITH CONTRAST TECHNIQUE: Multidetector CT imaging of the abdomen and pelvis was performed using the standard protocol following bolus administration of intravenous contrast. RADIATION DOSE REDUCTION: This exam was performed according to the departmental dose-optimization  program which includes automated  exposure control, adjustment of the mA and/or kV according to patient size and/or use of iterative reconstruction technique. CONTRAST:  OMNIPAQUE IOHEXOL 300 MG/ML  SOLN COMPARISON:  12/12/2004 FINDINGS: Lower chest: 6 no acute abnormality Hepatobiliary: No focal liver abnormality is seen. Status post cholecystectomy. No biliary dilatation. Pancreas: No focal abnormality or ductal dilatation. Spleen: No focal abnormality.  Normal size. Adrenals/Urinary Tract: No adrenal abnormality. No focal renal abnormality. No stones or hydronephrosis. Urinary bladder is unremarkable. Stomach/Bowel: Stomach, large and small bowel grossly unremarkable. Vascular/Lymphatic: Aortic atherosclerosis. No evidence of aneurysm or adenopathy. Reproductive: Uterus and adnexa unremarkable.  No mass. Other: Large volume ascites in the abdomen and pelvis. Omental thickening/caking noted anteriorly. Appearance is concerning for peritoneal carcinomatosis, often seen with ovarian cancer. However, no ovarian mass visualized. Musculoskeletal: No acute bony abnormality. IMPRESSION: Large volume ascites in the abdomen or pelvis with omental caking. Appearance is concerning for peritoneal malignancy/carcinomatosis, often seen with ovarian cancer, but no ovarian mass visualized. Aortic atherosclerosis. Electronically Signed   By: Charlett Nose M.D.   On: 10/22/2022 19:41     Data Reviewed: Relevant notes from primary care and specialist visits, past discharge summaries as available in EHR, including Care Everywhere. Prior diagnostic testing as pertinent to current admission diagnoses Updated medications and problem lists for reconciliation ED course, including vitals, labs, imaging, treatment and response to treatment Triage notes, nursing and pharmacy notes and ED provider's notes Notable results as noted in HPI   Assessment and Plan: Peritoneal carcinomatosis with malignant ascites on CT 10/22/22  (HCC) Intractable vomiting SIRS SIRS criteria include leukocytosis and hypotension with temp of 100.6, suspect related to vomiting versus infection CT showing large volume ascites with omental caking concerning for peritoneal malignancy-carcinomatosis but no mass IR consulted for consideration of paracentesis-n.p.o. overnight and hold Lovenox prophylaxis Consider oncology/ID/GI consults Will get procalcitonin Empiric Rocephin for possible SBP Symptom control with IV pain meds, antiemetics  Hypotension Essential hypertension Secondary to volume depletion from intractable vomiting Hold antihypertensives IV fluid  AKI (acute kidney injury) (HCC) Prerenal/ATN from volume depletion related to intractable vomiting Creatinine 1.19 up from recent baseline of 0.59 IV fluid resuscitation and monitor  Diarrhea Patient reports soft stool Monitor and test for C. difficile, GI panel as appropriate given recent prolonged antibiotic therapy  History of MSSA bacteremia due to osteomyelitis L foot 09/18/22 S/p left BKA 9/17 secondary to osteomyelitis Patient completed cefazolin a couple weeks prior No acute issues suspected TEE on 9/17 was negative for vegetations  Obesity, Class III, BMI 40-49.9 (morbid obesity) (HCC) Complicating factor to overall prognosis and care  Diabetes mellitus, type 2 (HCC) Sliding scale insulin coverage  Clinical depression Continue home venlafaxine and alprazolam  Asthma without status asthmaticus Not acutely exacerbated Continue Singulair Albuterol as needed    DVT prophylaxis: SCD   Consults: IR  Advance Care Planning:   Code Status: Prior   Family Communication: none  Disposition Plan: Back to previous home environment  Severity of Illness: The appropriate patient status for this patient is INPATIENT. Inpatient status is judged to be reasonable and necessary in order to provide the required intensity of service to ensure the patient's safety.  The patient's presenting symptoms, physical exam findings, and initial radiographic and laboratory data in the context of their chronic comorbidities is felt to place them at high risk for further clinical deterioration. Furthermore, it is not anticipated that the patient will be medically stable for discharge from the hospital within 2 midnights of admission.   *  I certify that at the point of admission it is my clinical judgment that the patient will require inpatient hospital care spanning beyond 2 midnights from the point of admission due to high intensity of service, high risk for further deterioration and high frequency of surveillance required.*  Author: Andris Baumann, MD 10/22/2022 8:21 PM  For on call review [email or web address].

## 2022-10-22 NOTE — Assessment & Plan Note (Signed)
Continue home venlafaxine and alprazolam

## 2022-10-22 NOTE — Assessment & Plan Note (Signed)
Complicating factor to overall prognosis and care 

## 2022-10-22 NOTE — Assessment & Plan Note (Deleted)
Intractable vomiting SIRS SIRS criteria include leukocytosis and hypotension with temp of 100.6, suspect related to vomiting versus infection CT showing large volume ascites with omental caking concerning for peritoneal malignancy-carcinomatosis but no mass IR consulted for consideration of paracentesis-n.p.o. overnight and hold Lovenox prophylaxis Consider oncology/ID/GI consults Will get procalcitonin Empiric Rocephin for possible SBP Symptom control with IV pain meds, antiemetics

## 2022-10-22 NOTE — Assessment & Plan Note (Signed)
Essential hypertension Secondary to volume depletion from intractable vomiting Hold antihypertensives IV fluid

## 2022-10-22 NOTE — ED Notes (Signed)
Attempt IV placement x2 without success.  Second RN to start IV.

## 2022-10-22 NOTE — ED Triage Notes (Signed)
Pt to ED for generalized abd pain with emesis x6 days. Pt declines antipyretic, states will make her vomit.

## 2022-10-22 NOTE — ED Notes (Signed)
Pt rolled over to toilet in room.  Pt was able to transfer to toilet by pivoting.  Pt expresses that she has increased abdominal distention since IV fluids.

## 2022-10-22 NOTE — Assessment & Plan Note (Signed)
Prerenal/ATN from volume depletion related to intractable vomiting Creatinine 1.19 up from recent baseline of 0.59 IV fluid resuscitation and monitor

## 2022-10-22 NOTE — Assessment & Plan Note (Signed)
Not acutely exacerbated Continue Singulair Albuterol as needed

## 2022-10-22 NOTE — ED Notes (Signed)
ED TO INPATIENT HANDOFF REPORT  ED Nurse Name and Phone #: Alvino Chapel [phone]  S Name/Age/Gender Nancy [name] 57 y.o. female Room/Bed: ED13A/ED13A  Code Status   Code Status: Prior  Home/SNF/Other Home Patient oriented to: self, place, time, and situation Is this baseline? Yes   Triage Complete: Triage complete  Chief Complaint Ascites [R18.8]  Triage Note Pt to ED for generalized abd pain with emesis x6 days. Pt declines antipyretic, states will make her vomit.    Allergies Allergies  Allergen Reactions   Shellfish Allergy Anaphylaxis   Codeine Other (See Comments)    Migraine  Other Reaction(s): Other (See Comments)  Reaction:  Severe migraines   Morphine And Codeine Other (See Comments)    Reaction:  Severe migraines    Percocet [Oxycodone-Acetaminophen] Nausea And Vomiting   Rosuvastatin     Other reaction(s): Muscle Pain   Sulfa Antibiotics Rash   Theophyllines Itching and Rash    Level of Care/Admitting Diagnosis ED Disposition     ED Disposition  Admit   Condition  --   Comment  Hospital Area: [facility] [[id number]]  Level of Care: Telemetry Medical [104]  Covid Evaluation: Asymptomatic - no recent exposure (last 10 days) testing not required  Diagnosis: Ascites [743709]  Admitting Physician: Andris Baumann [[id number]]  Attending Physician: Andris Baumann [[id number]]  Certification:: I certify this patient will need inpatient services for at least 2 midnights  Expected Medical Readiness: 10/25/2022          B Medical/Surgery History Past Medical History:  Diagnosis Date   Abnormal cardiovascular stress test 09/21/2018   Formatting of this note might be different from the original. Lexiscan Myoview 09/16/2018 revealed mild anterior ischemia   Adverse effect of motion 05/10/2014   Anxiety    Arthritis    r knee   Asthma    Breast cyst 05/10/2014   CAD (coronary artery disease)    a.) LHC 06/04/2009 at [facility];  non-obstructive CAD. b.) CTA with FFR 10/08/2018: extensive mixed plaque proximal to mid LAD (51-69%); Coronary Ca score 217; FFR 0.71 dPDA, 0.86 mLAD, 0.87 dLCx.   CCF (congestive cardiac failure) (HCC) 06/06/2008   a.) 30% EF. b.) TTE 06/03/2011: EF >55%; triv MR. c.) TTE 04/27/2018: EF 55%; mild LVH; triv PR, mild MR/TR; G1DD.   Chest pain with high risk for cardiac etiology 07/02/2016   Chronic use of opiate drug for therapeutic purpose 02/13/2022   Formatting of this note might be different from the original. [facility] Pain Contract signed on 04/18/16 & updated 03/04/17; UDS done on 04/18/16.   Complication of anesthesia    Diabetic foot infection (HCC) 03/13/2015   Diabetic foot ulcer (HCC) 02/08/2021   Eczema    Family history of adverse reaction to anesthesia    a.) PONV in mother and grandmother   GERD (gastroesophageal reflux disease) 05/10/2014   History of kidney stones    HLD (hyperlipidemia)    Hyponatremia 03/13/2015   Migraines    Motion sickness    Other acute osteomyelitis, left ankle and foot (HCC) 02/13/2022   Panic attacks    Peripheral edema 04/27/2018   Pneumonia    PONV (postoperative nausea and vomiting)    Sepsis secondary to diabetic foot infection 03/13/2015   T2DM (type 2 diabetes mellitus) (HCC)    Takotsubo cardiomyopathy / transient apical balooning syndrome / stress-induced cardiomyopathy 05/10/2014   Unspecified essential hypertension    Past Surgical History:  Procedure Laterality Date   AMPUTATION  Left 09/19/2022   Procedure: AMPUTATION BELOW KNEE;  Surgeon: Renford Dills, MD;  Location: [facility] ORS;  Service: Vascular;  Laterality: Left;   AMPUTATION Left 09/24/2022   Procedure: AMPUTATION BELOW KNEE WITH WOUND CLOSURE;  Surgeon: Renford Dills, MD;  Location: [facility] ORS;  Service: Vascular;  Laterality: Left;   AMPUTATION TOE Left 03/16/2015   Procedure: left fifth toe amputation with incision and drainage;  Surgeon: Gwyneth Revels, DPM;  Location: [facility]  ORS;  Service: Podiatry;  Laterality: Left;   APPLICATION OF WOUND VAC Left 02/14/2021   Procedure: APPLICATION OF WOUND VAC;  Surgeon: Gwyneth Revels, DPM;  Location: [facility] OR[facility];  Service: Podiatry;  Laterality: Left;   APPLICATION OF WOUND VAC Left 04/16/2021   Procedure: APPLICATION OF WOUND VAC;  Surgeon: Peggye Form, DO;  Location: [facility];  Service: Plastics;  Laterality: Left;   CARDIAC CATHETERIZATION Left 06/04/2009   Procedure: CARDIAC CATHETERIZATION; Location: [facility]   CHOLECYSTECTOMY     DEBRIDEMENT AND CLOSURE WOUND Left 04/16/2021   Procedure: DEBRIDEMENT AND CLOSURE WOUND;  Surgeon: Peggye Form, DO;  Location: [facility];  Service: Plastics;  Laterality: Left;  1 hour   IRRIGATION AND DEBRIDEMENT FOOT Left 02/08/2021   Procedure: IRRIGATION AND DEBRIDEMENT FOOT - LFT HEEL ULCER;  Surgeon: Gwyneth Revels, DPM;  Location: [facility] OR[facility];  Service: Podiatry;  Laterality: Left;   IRRIGATION AND DEBRIDEMENT FOOT Left 02/14/2021   Procedure: IRRIGATION AND DEBRIDEMENT LEFT HEEL;  Surgeon: Gwyneth Revels, DPM;  Location: [facility] OR[facility];  Service: Podiatry;  Laterality: Left;   kidney stone removal     KNEE ARTHROSCOPY W/ MENISCAL REPAIR     NOSE SURGERY  01/07/1989   due to fracture   TEE WITHOUT CARDIOVERSION N/A 09/26/2022   Procedure: TRANSESOPHAGEAL ECHOCARDIOGRAM;  Surgeon: Clotilde Dieter, DO;  Location: [facility] ORS;  Service: Cardiovascular;  Laterality: N/A;   TOE AMPUTATION     second toe   TONSILLECTOMY     TOTAL KNEE ARTHROPLASTY  01/11/2011   Procedure: TOTAL KNEE ARTHROPLASTY;  Surgeon: Harvie Junior;  Location: [facility];  Service: Orthopedics;  Laterality: Right;  COMPUTER ASSISTED TOTAL KNEE REPLACEMENT     A IV Location/Drains/Wounds Patient Lines/Drains/Airways Status     Active Line/Drains/Airways     Name Placement date Placement time Site Days   Peripheral IV 10/22/22 20 G Right Antecubital 10/22/22  1555   Antecubital  less than 1            Intake/Output Last 24 hours No intake or output data in the 24 hours ending 10/22/22 2053  Labs/Imaging Results for orders placed or performed during the hospital encounter of 10/22/22 (from the past 48 hour(s))  Lactic acid, plasma     Status: None   Collection Time: 10/22/22  3:26 PM  Result Value Ref Range   Lactic Acid, Venous 1.0 0.5 - 1.9 mmol/L    Comment: Performed at [facility], [address] [place], [place] [address]  Comprehensive metabolic panel     Status: Abnormal   Collection Time: 10/22/22  3:26 PM  Result Value Ref Range   Sodium 133 (L) 135 - 145 mmol/L   Potassium 5.1 3.5 - 5.1 mmol/L   Chloride 97 (L) 98 - 111 mmol/L   CO2 24 22 - 32 mmol/L   Glucose, Bld 125 (H) 70 - 99 mg/dL    Comment: Glucose reference range applies only to samples taken after fasting for at least 8  hours.   BUN 54 (H) 6 - 20 mg/dL   Creatinine, Ser 1.61 (H) 0.44 - 1.00 mg/dL   Calcium 8.8 (L) 8.9 - 10.3 mg/dL   Total Protein 8.3 (H) 6.5 - 8.1 g/dL   Albumin 3.0 (L) 3.5 - 5.0 g/dL   AST 14 (L) 15 - 41 U/L   ALT 7 0 - 44 U/L   Alkaline Phosphatase 56 38 - 126 U/L   Total Bilirubin 0.6 0.3 - 1.2 mg/dL   GFR, Estimated 53 (L) >60 mL/min    Comment: (NOTE) Calculated using the CKD-EPI Creatinine Equation (2021)    Anion gap 12 5 - 15    Comment: Performed at [facility], [address] [place], [place] [address]  CBC with Differential     Status: Abnormal   Collection Time: 10/22/22  3:26 PM  Result Value Ref Range   WBC 11.7 (H) 4.0 - 10.5 K/uL   RBC 3.44 (L) 3.87 - 5.11 MIL/uL   Hemoglobin 9.8 (L) 12.0 - 15.0 g/dL   HCT 54.0 (L) 98.1 - 19.1 %   MCV 91.3 80.0 - 100.0 fL   MCH 28.5 26.0 - 34.0 pg   MCHC 31.2 30.0 - 36.0 g/dL   RDW 47.8 29.5 - 62.1 %   Platelets 552 (H) 150 - 400 K/uL   nRBC 0.0 0.0 - 0.2 %   Neutrophils Relative % 74 %   Neutro Abs 8.7 (H) 1.7 - 7.7 K/uL   Lymphocytes Relative 15 %   Lymphs Abs  1.7 0.7 - 4.0 K/uL   Monocytes Relative 7 %   Monocytes Absolute 0.9 0.1 - 1.0 K/uL   Eosinophils Relative 2 %   Eosinophils Absolute 0.2 0.0 - 0.5 K/uL   Basophils Relative 1 %   Basophils Absolute 0.1 0.0 - 0.1 K/uL   Immature Granulocytes 1 %   Abs Immature Granulocytes 0.07 0.00 - 0.07 K/uL    Comment: Performed at [facility], [address] [place], [place] [address]  Urinalysis, w/ Reflex to Culture (Infection Suspected) -Urine, Clean Catch     Status: Abnormal   Collection Time: 10/22/22  3:26 PM  Result Value Ref Range   Specimen Source URINE, CLEAN CATCH    Color, Urine YELLOW (A) YELLOW   APPearance HAZY (A) CLEAR   Specific Gravity, Urine 1.026 1.005 - 1.030   pH 5.0 5.0 - 8.0   Glucose, UA NEGATIVE NEGATIVE mg/dL   Hgb urine dipstick SMALL (A) NEGATIVE   Bilirubin Urine NEGATIVE NEGATIVE   Ketones, ur NEGATIVE NEGATIVE mg/dL   Protein, ur NEGATIVE NEGATIVE mg/dL   Nitrite NEGATIVE NEGATIVE   Leukocytes,Ua NEGATIVE NEGATIVE   RBC / HPF 6-10 0 - 5 RBC/hpf   WBC, UA 6-10 0 - 5 WBC/hpf    Comment:        Reflex urine culture not performed if WBC <=10, OR if Squamous epithelial cells >5. If Squamous epithelial cells >5 suggest recollection.    Bacteria, UA NONE SEEN NONE SEEN   Squamous Epithelial / HPF 0-5 0 - 5 /HPF   Mucus PRESENT    Hyaline Casts, UA PRESENT     Comment: Performed at [facility], [address] [place], [place] [address]  Resp panel by RT-PCR (RSV, Flu A&B, Covid) Anterior Nasal Swab     Status: None   Collection Time: 10/22/22  4:03 PM   Specimen: Anterior Nasal Swab  Result Value Ref Range   SARS Coronavirus 2 by  RT PCR NEGATIVE NEGATIVE    Comment: (NOTE) SARS-CoV-2 target nucleic acids are NOT DETECTED.  The SARS-CoV-2 RNA is generally detectable in upper respiratory specimens during the acute phase of infection. The lowest concentration of SARS-CoV-2 viral copies this assay can detect is 138 copies/mL. A  negative result does not preclude SARS-Cov-2 infection and should not be used as the sole basis for treatment or other patient management decisions. A negative result may occur with  improper specimen collection/handling, submission of specimen other than nasopharyngeal swab, presence of viral mutation(s) within the areas targeted by this assay, and inadequate number of viral copies(<138 copies/mL). A negative result must be combined with clinical observations, patient history, and epidemiological information. The expected result is Negative.  Fact Sheet for Patients:  BloggerCourse.com  Fact Sheet for Healthcare Providers:  SeriousBroker.it  This test is no t yet approved or cleared by the [place] FDA and  has been authorized for detection and/or diagnosis of SARS-CoV-2 by FDA under an Emergency Use Authorization (EUA). This EUA will remain  in effect (meaning this test can be used) for the duration of the COVID-19 declaration under Section 564(b)(1) of the Act, 21 U.S.C.section 360bbb-3(b)(1), unless the authorization is terminated  or revoked sooner.       Influenza A by PCR NEGATIVE NEGATIVE   Influenza B by PCR NEGATIVE NEGATIVE    Comment: (NOTE) The Xpert Xpress SARS-CoV-2/FLU/RSV plus assay is intended as an aid in the diagnosis of influenza from Nasopharyngeal swab specimens and should not be used as a sole basis for treatment. Nasal washings and aspirates are unacceptable for Xpert Xpress SARS-CoV-2/FLU/RSV testing.  Fact Sheet for Patients: BloggerCourse.com  Fact Sheet for Healthcare Providers: SeriousBroker.it  This test is not yet approved or cleared by the [place] FDA and has been authorized for detection and/or diagnosis of SARS-CoV-2 by FDA under an Emergency Use Authorization (EUA). This EUA will remain in effect (meaning this test can be used)  for the duration of the COVID-19 declaration under Section 564(b)(1) of the Act, 21 U.S.C. section 360bbb-3(b)(1), unless the authorization is terminated or revoked.     Resp Syncytial Virus by PCR NEGATIVE NEGATIVE    Comment: (NOTE) Fact Sheet for Patients: BloggerCourse.com  Fact Sheet for Healthcare Providers: SeriousBroker.it  This test is not yet approved or cleared by the [place] FDA and has been authorized for detection and/or diagnosis of SARS-CoV-2 by FDA under an Emergency Use Authorization (EUA). This EUA will remain in effect (meaning this test can be used) for the duration of the COVID-19 declaration under Section 564(b)(1) of the Act, 21 U.S.C. section 360bbb-3(b)(1), unless the authorization is terminated or revoked.  Performed at [facility], [address] [place], [place] [address]    CT ABDOMEN PELVIS W CONTRAST  Result Date: 10/22/2022 CLINICAL DATA:  Abdominal pain, most pronounced in the left lower quadrant and suprapubic regions. Vomiting, fever. EXAM: CT ABDOMEN AND PELVIS WITH CONTRAST TECHNIQUE: Multidetector CT imaging of the abdomen and pelvis was performed using the standard protocol following bolus administration of intravenous contrast. RADIATION DOSE REDUCTION: This exam was performed according to the departmental dose-optimization program which includes automated exposure control, adjustment of the mA and/or kV according to patient size and/or use of iterative reconstruction technique. CONTRAST:  OMNIPAQUE IOHEXOL 300 MG/ML  SOLN COMPARISON:  12/12/2004 FINDINGS: Lower chest: 6 no acute abnormality Hepatobiliary: No focal liver abnormality is seen. Status post cholecystectomy. No biliary dilatation. Pancreas: No focal abnormality or ductal  dilatation. Spleen: No focal abnormality.  Normal size. Adrenals/Urinary Tract: No adrenal abnormality. No focal renal abnormality. No stones or  hydronephrosis. Urinary bladder is unremarkable. Stomach/Bowel: Stomach, large and small bowel grossly unremarkable. Vascular/Lymphatic: Aortic atherosclerosis. No evidence of aneurysm or adenopathy. Reproductive: Uterus and adnexa unremarkable.  No mass. Other: Large volume ascites in the abdomen and pelvis. Omental thickening/caking noted anteriorly. Appearance is concerning for peritoneal carcinomatosis, often seen with ovarian cancer. However, no ovarian mass visualized. Musculoskeletal: No acute bony abnormality. IMPRESSION: Large volume ascites in the abdomen or pelvis with omental caking. Appearance is concerning for peritoneal malignancy/carcinomatosis, often seen with ovarian cancer, but no ovarian mass visualized. Aortic atherosclerosis. Electronically Signed   By: Charlett Nose M.D.   On: 10/22/2022 19:41    Pending Labs Unresulted Labs (From admission, onward)     Start     Ordered   10/22/22 1529  Lipase, blood  Once,   STAT        10/22/22 1528            Vitals/Pain Today's Vitals   10/22/22 1451 10/22/22 1452 10/22/22 1600 10/22/22 1830  BP: 139/61  (!) 108/54 (!) 112/33  Pulse:  91 79 81  Resp: 20  19 15   Temp:   (!) 100.7 F (38.2 C) 99.7 F (37.6 C)  TempSrc:   Oral Oral  SpO2:  94% 99% 97%  Weight: 268 lb 15.4 oz (122 kg)     Height: 5\' 9"  (1.753 m)     PainSc: 10-Worst pain ever   5     Isolation Precautions No active isolations  Medications Medications  cefTRIAXone (ROCEPHIN) 2 g in sodium chloride 0.9 % 100 mL IVPB (has no administration in time range)  pantoprazole (PROTONIX) injection 40 mg (has no administration in time range)  lactated ringers bolus 1,000 mL (0 mLs Intravenous Stopped 10/22/22 1823)  ondansetron (ZOFRAN) injection 4 mg (4 mg Intravenous Given 10/22/22 1601)  fentaNYL (SUBLIMAZE) injection 50 mcg (50 mcg Intravenous Given 10/22/22 1559)  iohexol (OMNIPAQUE) 300 MG/ML solution 100 mL (100 mLs Intravenous Contrast Given 10/22/22 1705)   fentaNYL (SUBLIMAZE) injection 50 mcg (50 mcg Intravenous Given 10/22/22 1825)    Mobility walks with device and assist (recent right BKA, for me she is pivoting to bathroom from stretcher)     Focused Assessments Recent right BKA, abdominal pain and distention, this increased noticeably after 1L IV fluids   R Recommendations: See Admitting Provider Note  Report given to:   Additional Notes:

## 2022-10-22 NOTE — Assessment & Plan Note (Signed)
Patient reports soft stool Monitor and test for C. difficile, GI panel as appropriate given recent prolonged antibiotic therapy

## 2022-10-22 NOTE — Assessment & Plan Note (Signed)
Sliding scale insulin coverage 

## 2022-10-22 NOTE — Assessment & Plan Note (Signed)
S/p left BKA 9/17 secondary to osteomyelitis Patient completed cefazolin a couple weeks prior No acute issues suspected TEE on 9/17 was negative for vegetations

## 2022-10-22 NOTE — ED Notes (Signed)
Helped pt to bathroom to void in room.

## 2022-10-22 NOTE — ED Provider Notes (Signed)
[facility] Provider Note    Event Date/Time   First MD Initiated Contact with Patient 10/22/22 1459     (approximate)   History   Abdominal Pain   HPI ALITHIA ZAVALETA is a 57 y.o. female with HTN, CAD, CHF, DM2 presenting today for abdominal pain.  Patient states over the past 6 days she has had generalized abdominal pain associated with diminished bowel movements.  She is still having intermittent loose stools but no solid stools.  She has also had recurrent nausea and vomiting over the past several days and unable to keep anything down.  Had felt feverish and chills at home and does have a fever on arrival here at 100.9 F.  Otherwise denies cough, congestion, chest pain, shortness of breath.  No blood in emesis or stools.  Denies dysuria or hematuria.  Per chart review: Patient recently admitted to the hospital for gangrene of her left lower extremity.  She was discharged on cefazolin for 2 weeks.  States medication was completed 5 days ago on last Thursday and PICC line was removed at that time.     Physical Exam   Triage Vital Signs: ED Triage Vitals  Encounter Vitals Group     BP 10/22/22 1451 139/61     Systolic BP Percentile --      Diastolic BP Percentile --      Pulse Rate 10/22/22 1452 91     Resp 10/22/22 1451 20     Temp 10/22/22 1450 (!) 100.9 F (38.3 C)     Temp src --      SpO2 10/22/22 1452 94 %     Weight 10/22/22 1451 268 lb 15.4 oz (122 kg)     Height 10/22/22 1451 5\' 9"  (1.753 m)     Head Circumference --      Peak Flow --      Pain Score 10/22/22 1451 10     Pain Loc --      Pain Education --      Exclude from Growth Chart --     Most recent vital signs: Vitals:   10/22/22 1600 10/22/22 1830  BP: (!) 108/54 (!) 112/33  Pulse: 79 81  Resp: 19 15  Temp: (!) 100.7 F (38.2 C) 99.7 F (37.6 C)  SpO2: 99% 97%   Physical Exam: I have reviewed the vital signs and nursing notes. General: Awake, alert, no acute  distress.  Nontoxic appearing. Head:  Atraumatic, normocephalic.   ENT:  EOM intact, PERRL. Oral mucosa is pink and moist with no lesions. Neck: Neck is supple with full range of motion, No meningeal signs. Cardiovascular:  RRR, No murmurs. Peripheral pulses palpable and equal bilaterally. Respiratory:  Symmetrical chest wall expansion.  No rhonchi, rales, or wheezes.  Good air movement throughout.  No use of accessory muscles.   Musculoskeletal:  No cyanosis or edema. Moving extremities with full ROM.  BKA to left lower extremity with Ace wrap intact without drainage. Abdomen:  Soft, generalized tenderness palpation throughout abdomen most prominent in left lower quadrant and suprapubic, nondistended. Neuro:  GCS 15, moving all four extremities, interacting appropriately. Speech clear. Psych:  Calm, appropriate.   Skin:  Warm, dry, no rash.     ED Results / Procedures / Treatments   Labs (all labs ordered are listed, but only abnormal results are displayed) Labs Reviewed  COMPREHENSIVE METABOLIC PANEL - Abnormal; Notable for the following components:      Result Value  Sodium 133 (*)    Chloride 97 (*)    Glucose, Bld 125 (*)    BUN 54 (*)    Creatinine, Ser 1.19 (*)    Calcium 8.8 (*)    Total Protein 8.3 (*)    Albumin 3.0 (*)    AST 14 (*)    GFR, Estimated 53 (*)    All other components within normal limits  CBC WITH DIFFERENTIAL/PLATELET - Abnormal; Notable for the following components:   WBC 11.7 (*)    RBC 3.44 (*)    Hemoglobin 9.8 (*)    HCT 31.4 (*)    Platelets 552 (*)    Neutro Abs 8.7 (*)    All other components within normal limits  URINALYSIS, W/ REFLEX TO CULTURE (INFECTION SUSPECTED) - Abnormal; Notable for the following components:   Color, Urine YELLOW (*)    APPearance HAZY (*)    Hgb urine dipstick SMALL (*)    All other components within normal limits  RESP PANEL BY RT-PCR (RSV, FLU A&B, COVID)  RVPGX2  LACTIC ACID, PLASMA  LIPASE, BLOOD   PROCALCITONIN     EKG    RADIOLOGY Independently interpreted CT and agree with radiology read   PROCEDURES:  Critical Care performed: No  Procedures   MEDICATIONS ORDERED IN ED: Medications  cefTRIAXone (ROCEPHIN) 2 g in sodium chloride 0.9 % 100 mL IVPB (has no administration in time range)  pantoprazole (PROTONIX) injection 40 mg (has no administration in time range)  fentaNYL (SUBLIMAZE) injection 50 mcg (has no administration in time range)  cefTRIAXone (ROCEPHIN) 2 g in sodium chloride 0.9 % 100 mL IVPB (has no administration in time range)  promethazine (PHENERGAN) 25 mg in sodium chloride 0.9 % 50 mL IVPB (has no administration in time range)  lactated ringers bolus 1,000 mL (0 mLs Intravenous Stopped 10/22/22 1823)  ondansetron (ZOFRAN) injection 4 mg (4 mg Intravenous Given 10/22/22 1601)  fentaNYL (SUBLIMAZE) injection 50 mcg (50 mcg Intravenous Given 10/22/22 1559)  iohexol (OMNIPAQUE) 300 MG/ML solution 100 mL (100 mLs Intravenous Contrast Given 10/22/22 1705)  fentaNYL (SUBLIMAZE) injection 50 mcg (50 mcg Intravenous Given 10/22/22 1825)     IMPRESSION / MDM / ASSESSMENT AND PLAN / ED COURSE  I reviewed the triage vital signs and the nursing notes.                              Differential diagnosis includes, but is not limited to, SBO, cholecystitis, pancreatitis, SBP, bacteremia  Patient's presentation is most consistent with acute presentation with potential threat to life or bodily function.  Patient is a 57 year old female presenting today for fever, abdominal pain, vomiting.  Declined Tylenol on arrival for fever due to reported reactions in the past.  Was given pain meds, fluid meds, and antinausea medications.  Mild leukocytosis at 11.7 K.  CMP notable for dehydration.  Lactic unremarkable.  Patient negative for COVID, flu, and RSV.  CT imaging was ordered to rule out further intra-abdominal infection.  CT read shows concern for large volume ascites  concerning for possible peritoneal malignancy versus carcinomatosis.  No overt masses are seen at this time.  Patient required multiple doses of repeat medication.  No history of cirrhosis or heart failure to otherwise explain ascites.  Discussed case with hospitalist for admission for ongoing evaluation.  Also concern for possible SBP and infection of the ascitic fluid.  Bedside ultrasound revealed no large enough pocket for  me to be able to do at the bedside.  Will start empirically on ceftriaxone and Protonix for SBP.  Patient admitted to hospitalist for further care.  The patient is on the cardiac monitor to evaluate for evidence of arrhythmia and/or significant heart rate changes. Clinical Course as of 10/22/22 2055  Tue Oct 22, 2022  1619 Temp(!): 100.7 F (38.2 C) Patient refused Tylenol at this time [DW]  1619 WBC(!): 11.7 Mild leukocytosis [DW]  1642 Comprehensive metabolic panel(!) Doubling of creatinine associated with hyponatremia, hypochloremia, and increased BUN concerning for dehydration in the setting of vomiting. [DW]  1752 Ongoing pain symptoms.  Will redosed with more pain medicine [DW]  1756 CT ABDOMEN PELVIS W CONTRAST Intra-abdominal free fluid noted on my CT interpretation.  Unclear exact location of etiology.  Awaiting formal read.  No history of cirrhosis or CHF [DW]  1847 Urinalysis, w/ Reflex to Culture (Infection Suspected) -Urine, Clean Catch(!) No UTI [DW]  1944 CT ABDOMEN PELVIS W CONTRAST Large volume ascites in the abdomen or pelvis with omental caking. Appearance is concerning for peritoneal malignancy/carcinomatosis, often seen with ovarian cancer, but no ovarian mass visualized. [DW]    Clinical Course User Index [DW] Janith Lima, MD     FINAL CLINICAL IMPRESSION(S) / ED DIAGNOSES   Final diagnoses:  Generalized abdominal pain  Nausea and vomiting, unspecified vomiting type  Fever, unspecified fever cause     Rx / DC Orders   ED Discharge  Orders     None        Note:  This document was prepared using Dragon voice recognition software and may include unintentional dictation errors.   Janith Lima, MD 10/22/22 2055

## 2022-10-23 ENCOUNTER — Other Ambulatory Visit: Payer: Self-pay | Admitting: Family

## 2022-10-23 ENCOUNTER — Inpatient Hospital Stay: Payer: 59

## 2022-10-23 DIAGNOSIS — R180 Malignant ascites: Secondary | ICD-10-CM

## 2022-10-23 DIAGNOSIS — R188 Other ascites: Secondary | ICD-10-CM

## 2022-10-23 LAB — COMPREHENSIVE METABOLIC PANEL
ALT: 7 U/L (ref 0–44)
AST: 13 U/L — ABNORMAL LOW (ref 15–41)
Albumin: 2.6 g/dL — ABNORMAL LOW (ref 3.5–5.0)
Alkaline Phosphatase: 53 U/L (ref 38–126)
Anion gap: 9 (ref 5–15)
BUN: 48 mg/dL — ABNORMAL HIGH (ref 6–20)
CO2: 24 mmol/L (ref 22–32)
Calcium: 8.3 mg/dL — ABNORMAL LOW (ref 8.9–10.3)
Chloride: 99 mmol/L (ref 98–111)
Creatinine, Ser: 1.13 mg/dL — ABNORMAL HIGH (ref 0.44–1.00)
GFR, Estimated: 57 mL/min — ABNORMAL LOW (ref >60)
Glucose, Bld: 127 mg/dL — ABNORMAL HIGH (ref 70–99)
Potassium: 4.8 mmol/L (ref 3.5–5.1)
Sodium: 132 mmol/L — ABNORMAL LOW (ref 135–145)
Total Bilirubin: 0.5 mg/dL (ref 0.3–1.2)
Total Protein: 7.3 g/dL (ref 6.5–8.1)

## 2022-10-23 LAB — ALBUMIN, PLEURAL OR PERITONEAL FLUID: Albumin, Fluid: 2.3 g/dL

## 2022-10-23 LAB — BRAIN NATRIURETIC PEPTIDE: B Natriuretic Peptide: 42.8 pg/mL (ref 0.0–100.0)

## 2022-10-23 LAB — GLUCOSE, CAPILLARY
Glucose-Capillary: 103 mg/dL — ABNORMAL HIGH (ref 70–99)
Glucose-Capillary: 110 mg/dL — ABNORMAL HIGH (ref 70–99)
Glucose-Capillary: 124 mg/dL — ABNORMAL HIGH (ref 70–99)
Glucose-Capillary: 140 mg/dL — ABNORMAL HIGH (ref 70–99)
Glucose-Capillary: 59 mg/dL — ABNORMAL LOW (ref 70–99)
Glucose-Capillary: 63 mg/dL — ABNORMAL LOW (ref 70–99)
Glucose-Capillary: 66 mg/dL — ABNORMAL LOW (ref 70–99)
Glucose-Capillary: 71 mg/dL (ref 70–99)
Glucose-Capillary: 77 mg/dL (ref 70–99)

## 2022-10-23 LAB — BODY FLUID CELL COUNT WITH DIFFERENTIAL
Eos, Fluid: 0 %
Lymphs, Fluid: 46 %
Monocyte-Macrophage-Serous Fluid: 25 %
Neutrophil Count, Fluid: 29 %
Total Nucleated Cell Count, Fluid: 5696 uL

## 2022-10-23 LAB — CBC
HCT: 29.1 % — ABNORMAL LOW (ref 36.0–46.0)
Hemoglobin: 9.2 g/dL — ABNORMAL LOW (ref 12.0–15.0)
MCH: 28.6 pg (ref 26.0–34.0)
MCHC: 31.6 g/dL (ref 30.0–36.0)
MCV: 90.4 fL (ref 80.0–100.0)
Platelets: 537 10*3/uL — ABNORMAL HIGH (ref 150–400)
RBC: 3.22 MIL/uL — ABNORMAL LOW (ref 3.87–5.11)
RDW: 14.3 % (ref 11.5–15.5)
WBC: 9.7 10*3/uL (ref 4.0–10.5)
nRBC: 0 % (ref 0.0–0.2)

## 2022-10-23 MED ORDER — ZINC SULFATE 220 (50 ZN) MG PO CAPS
220.0000 mg | ORAL_CAPSULE | Freq: Every day | ORAL | Status: DC
  Administered 2022-10-23 – 2022-10-30 (×8): 220 mg via ORAL
  Filled 2022-10-23 (×8): qty 1

## 2022-10-23 MED ORDER — METHOCARBAMOL 500 MG PO TABS
500.0000 mg | ORAL_TABLET | Freq: Three times a day (TID) | ORAL | Status: DC | PRN
  Administered 2022-10-23 – 2022-10-26 (×5): 500 mg via ORAL
  Filled 2022-10-23 (×6): qty 1

## 2022-10-23 MED ORDER — INSULIN ASPART PROT & ASPART (70-30 MIX) 100 UNIT/ML ~~LOC~~ SUSP
20.0000 [IU] | Freq: Two times a day (BID) | SUBCUTANEOUS | Status: DC
  Administered 2022-10-23 (×2): 20 [IU] via SUBCUTANEOUS
  Filled 2022-10-23: qty 10

## 2022-10-23 MED ORDER — ADULT MULTIVITAMIN W/MINERALS CH
1.0000 | ORAL_TABLET | Freq: Every day | ORAL | Status: DC
  Administered 2022-10-24 – 2022-10-30 (×7): 1 via ORAL
  Filled 2022-10-23 (×7): qty 1

## 2022-10-23 MED ORDER — CARVEDILOL 6.25 MG PO TABS
6.2500 mg | ORAL_TABLET | Freq: Two times a day (BID) | ORAL | Status: DC
  Administered 2022-10-23 – 2022-10-30 (×15): 6.25 mg via ORAL
  Filled 2022-10-23 (×15): qty 1

## 2022-10-23 MED ORDER — PANTOPRAZOLE SODIUM 40 MG IV SOLR
40.0000 mg | INTRAVENOUS | Status: DC
  Administered 2022-10-23 – 2022-10-25 (×3): 40 mg via INTRAVENOUS
  Filled 2022-10-23 (×3): qty 10

## 2022-10-23 MED ORDER — TRAMADOL HCL 50 MG PO TABS: 50.0000 mg | ORAL_TABLET | Freq: Four times a day (QID) | ORAL | Status: DC | PRN

## 2022-10-23 MED ORDER — ASPIRIN 81 MG PO TBEC
81.0000 mg | DELAYED_RELEASE_TABLET | Freq: Every day | ORAL | Status: DC
  Administered 2022-10-23 – 2022-10-30 (×8): 81 mg via ORAL
  Filled 2022-10-23 (×8): qty 1

## 2022-10-23 MED ORDER — POLYSACCHARIDE IRON COMPLEX 150 MG PO CAPS
150.0000 mg | ORAL_CAPSULE | Freq: Every day | ORAL | Status: DC
  Administered 2022-10-23 – 2022-10-30 (×8): 150 mg via ORAL
  Filled 2022-10-23 (×8): qty 1

## 2022-10-23 MED ORDER — ENSURE MAX PROTEIN PO LIQD
11.0000 [oz_av] | Freq: Three times a day (TID) | ORAL | Status: DC
  Administered 2022-10-24 – 2022-10-30 (×16): 11 [oz_av] via ORAL
  Filled 2022-10-23: qty 330

## 2022-10-23 MED ORDER — LIDOCAINE HCL (PF) 1 % IJ SOLN
10.0000 mL | Freq: Once | INTRAMUSCULAR | Status: AC
  Administered 2022-10-23: 10 mL via INTRADERMAL
  Filled 2022-10-23: qty 10

## 2022-10-23 MED ORDER — ASPIRIN-ACETAMINOPHEN-CAFFEINE 250-250-65 MG PO TABS: 2.0000 | ORAL_TABLET | Freq: Four times a day (QID) | ORAL | Status: DC | PRN

## 2022-10-23 MED ORDER — RISAQUAD PO CAPS
1.0000 | ORAL_CAPSULE | Freq: Every day | ORAL | Status: DC
  Administered 2022-10-23 – 2022-10-30 (×8): 1 via ORAL
  Filled 2022-10-23 (×8): qty 1

## 2022-10-23 MED ORDER — ALBUTEROL SULFATE (2.5 MG/3ML) 0.083% IN NEBU: 2.5000 mg | INHALATION_SOLUTION | Freq: Four times a day (QID) | RESPIRATORY_TRACT | Status: DC | PRN

## 2022-10-23 NOTE — Procedures (Signed)
PROCEDURE SUMMARY:  Successful US guided paracentesis from left lower quadrant.  Yielded 3.9 L of clear yellow fluid.  No immediate complications.  Pt tolerated well.   Specimen sent for labs.  EBL < 2 mL  Mickie Kay, NP 10/23/2022 4:00 PM

## 2022-10-23 NOTE — Progress Notes (Signed)
Initial Nutrition Assessment  DOCUMENTATION CODES:   Obesity unspecified  INTERVENTION:   Ensure Max protein supplement po TID, each supplement provides 150kcal and 30g of protein.  MVI po daily   Pt at high refeed risk; recommend monitor potassium, magnesium and phosphorus labs daily until stable  Daily weights   NUTRITION DIAGNOSIS:   Inadequate oral intake related to acute illness as evidenced by per patient/family report.  GOAL:   Patient will meet greater than or equal to 90% of their needs  MONITOR:   PO intake, Supplement acceptance, Labs, Weight trends, I & O's, Skin  REASON FOR ASSESSMENT:   Malnutrition Screening Tool    ASSESSMENT:   57 y/o female with h/o HTN, asthma, HLD, cardiomyopathy, DM, CHF, depression, CAD, GERD and recent admission for MSSA bacteremia due to osteomyelitis L foot s/p L BKA 09/24/22 and who is now admitted with large volume ascites and omental caking concerning for peritoneal carcinomatosis and AKI.  -Pt s/p thoracentesis today with 3.9L output   Visited pt's room today. Pt away at procedure at time of RD visit. Pt's family at bedside reports pt with poor appetite and oral intake for the past week r/t nausea and vomiting. Family reports that pt has not been able to keep anything down, even supplements for six days. Pt has also been having diarrhea; C-diff testing is pending. Per family, pt with worsening abdominal distension r/t ascites. Pt having paracentesis at time of RD visit today. RD will follow up to obtain exam and history from patient. No new weight this admission but family reports that patient has had weight loss pta.   Medications reviewed and include: risaquad, aspirin, insulin, iron, protonix, zinc, ceftriaxone  Labs reviewed: Na 132(L), K 4.8 wnl, BUN 48(H), creat 1.13(H) Hgb 9.2(L), Hct 29.1(L) Cbgs- 124, 77, 63, 66, 140, 110 x 24 hrs  AIC 10.6(H)- 7/31  NUTRITION - FOCUSED PHYSICAL EXAM: Unable to perform at this time    Diet Order:   Diet Order             Diet Carb Modified Fluid consistency: Thin  Diet effective now                  EDUCATION NEEDS:   Not appropriate for education at this time  Skin:  Skin Assessment: Reviewed RN Assessment  Last BM:  10/15  Height:   Ht Readings from Last 1 Encounters:  10/22/22 5\' 9"  (1.753 m)    Weight:   Wt Readings from Last 1 Encounters:  10/22/22 122 kg   BMI:  Body mass index is 39.72 kg/m.  Estimated Nutritional Needs:   Kcal:  2400-2700kcal/day  Protein:  >120g/day  Fluid:  2.0-2.3L/day  Betsey Holiday MS, RD, LDN Please refer to [facility] for RD and/or RD on-call/weekend/after hours pager

## 2022-10-23 NOTE — Progress Notes (Signed)
PROGRESS NOTE    Nancy [name]  ZOX:[id number] DOB: 07-15-1965 DOA: 10/22/2022 PCP: Miki Kins, FNP    Brief Narrative:  57 y.o. female who presented with abdominal pain with CT showing large volume ascites and omental caking concerning for peritoneal carcinomatosis.  Patient has past medical history significant for type 2 diabetes, CAD, asthma, chronic pain, diabetic foot infection, GERD, hyperlipidemia, Takotsubo cardiomyopathy hospitalized from 9/11 to 09/30/2022 with sepsis secondary to diabetic foot infection/osteomyelitis s/p left BKA 9/17, also found to have Staph aureus bacteremia with TEE negative for vegetations, discharged home with 2 weeks of IV cefazolin, completed a few days ago, who presents to the ED with a 6-day history of nonbloody nonbilious vomiting Associated with generalized abdominal pain and distention.  States during her recent past hospitalization she initially presented with bleeding from her rectum but it spontaneously resolved.  She had abdominal pain and nausea but she attributed it to her foot infection.  She denies a history of unintentional weight loss or poor appetite except for when her vomiting started a week ago.   She denies cough, chest pain, shortness of breath, leg pain, black or bloody stool except for one-time episode prior to her last hospitalization and denies, dysuria.    Assessment & Plan:   Principal Problem:   Ascites, possibly malignant Active Problems:   Intractable vomiting   Hypotension   Essential hypertension   AKI (acute kidney injury) (HCC)   History of MSSA bacteremia due to osteomyelitis L foot 09/18/22   S/P left  BKA 09/24/22 secondary to osteomyelitis (below knee amputation) (HCC)   Diarrhea   Asthma without status asthmaticus   Clinical depression   Takotsubo cardiomyopathy   Diabetes mellitus, type 2 (HCC)   Obesity, Class III, BMI 40-49.9 (morbid obesity) (HCC)   Ascites, possibly malignant Peritoneal caking on  CT/ ?Carcinomatosis Intractable vomiting SIRS SIRS criteria include leukocytosis and hypotension with temp of 100.6, suspect related to vomiting versus infection CT showing large volume ascites with omental caking concerning for peritoneal malignancy-carcinomatosis but no mass Plan: Continue empiric Rocephin for SBP Symptom management with IV narcotics and antiemetics Ultrasound-guided paracentesis ordered Further recommendations post paracentesis  Hypotension Essential hypertension Secondary to volume depletion from intractable vomiting Hold antihypertensives Continue IVF   AKI (acute kidney injury) (HCC) Prerenal/ATN from volume depletion related to intractable vomiting Creatinine 1.19 up from recent baseline of 0.59 Creatinine improving.  Continue IVF   Diarrhea Patient reports soft stool Monitor and test for C. difficile, GI panel as appropriate given recent prolonged antibiotic therapy, pending   History of MSSA bacteremia due to osteomyelitis L foot 09/18/22 S/p left BKA 9/17 secondary to osteomyelitis Patient completed cefazolin a couple weeks prior No acute issues suspected TEE on 9/17 was negative for vegetations   Obesity, Class III, BMI 40-49.9 (morbid obesity) (HCC) Complicating factor to overall prognosis and care   Diabetes mellitus, type 2 (HCC) Sliding scale insulin coverage   Clinical depression Continue home venlafaxine and alprazolam   Asthma without status asthmaticus Not acutely exacerbated Continue Singulair Albuterol as needed    DVT prophylaxis: SCD Code Status: Full Family Communication: Spouse and mother at bedside 10/16 Disposition Plan: Status is: Inpatient Remains inpatient appropriate because: Ascites of unclear etiology.  Workup in progress   Level of care: Telemetry Medical  Consultants:  Interventional radiology  Procedures:  Ultrasound-guided paracentesis  Antimicrobials: Ceftriaxone   Subjective: Seen and examined.   Resting in bed.  Spouse and mother at bedside.  No visible  distress.  Does endorse abdominal pain and distention.  Objective: Vitals:   10/22/22 1830 10/22/22 2000 10/23/22 0257 10/23/22 0739  BP: (!) 112/33 139/75 117/64 (!) 119/58  Pulse: 81 82 76 79  Resp: 15 14 20 18   Temp: 99.7 F (37.6 C)  98.3 F (36.8 C) 98.6 F (37 C)  TempSrc: Oral   Oral  SpO2: 97% 97% 96% 95%  Weight:      Height:       No intake or output data in the 24 hours ending 10/23/22 1149 Filed Weights   10/22/22 1451  Weight: 122 kg    Examination:  General exam: Appears calm and comfortable  Respiratory system: Clear to auscultation. Respiratory effort normal. Cardiovascular system: S1-S2, RRR, no murmurs, no pedal edema Gastrointestinal system: Soft, mildly distended, mild tender to palpation, decreased bowel sounds Central nervous system: Alert and oriented. No focal neurological deficits. Extremities: Symmetric 5 x 5 power. Skin: No rashes, lesions or ulcers Psychiatry: Judgement and insight appear normal. Mood & affect appropriate.     Data Reviewed: I have personally reviewed following labs and imaging studies  CBC: Recent Labs  Lab 10/22/22 1526 10/23/22 0510  WBC 11.7* 9.7  NEUTROABS 8.7*  --   HGB 9.8* 9.2*  HCT 31.4* 29.1*  MCV 91.3 90.4  PLT 552* 537*   Basic Metabolic Panel: Recent Labs  Lab 10/22/22 1526 10/23/22 0510  NA 133* 132*  K 5.1 4.8  CL 97* 99  CO2 24 24  GLUCOSE 125* 127*  BUN 54* 48*  CREATININE 1.19* 1.13*  CALCIUM 8.8* 8.3*   GFR: Estimated Creatinine Clearance: 76.7 mL/min (A) (by C-G formula based on SCr of 1.13 mg/dL (H)). Liver Function Tests: Recent Labs  Lab 10/22/22 1526 10/23/22 0510  AST 14* 13*  ALT 7 7  ALKPHOS 56 53  BILITOT 0.6 0.5  PROT 8.3* 7.3  ALBUMIN 3.0* 2.6*   Recent Labs  Lab 10/22/22 1603  LIPASE 23   No results for input(s): "AMMONIA" in the last 168 hours. Coagulation Profile: No results for input(s): "INR",  "PROTIME" in the last 168 hours. Cardiac Enzymes: No results for input(s): "CKTOTAL", "CKMB", "CKMBINDEX", "TROPONINI" in the last 168 hours. BNP (last 3 results) No results for input(s): "PROBNP" in the last 8760 hours. HbA1C: No results for input(s): "HGBA1C" in the last 72 hours. CBG: Recent Labs  Lab 10/22/22 2149 10/23/22 0408 10/23/22 0738 10/23/22 1108 10/23/22 1135  GLUCAP 92 110* 140* 66* 63*   Lipid Profile: No results for input(s): "CHOL", "HDL", "LDLCALC", "TRIG", "CHOLHDL", "LDLDIRECT" in the last 72 hours. Thyroid Function Tests: No results for input(s): "TSH", "T4TOTAL", "FREET4", "T3FREE", "THYROIDAB" in the last 72 hours. Anemia Panel: No results for input(s): "VITAMINB12", "FOLATE", "FERRITIN", "TIBC", "IRON", "RETICCTPCT" in the last 72 hours. Sepsis Labs: Recent Labs  Lab 10/22/22 1526  PROCALCITON 0.12  LATICACIDVEN 1.0    Recent Results (from the past 240 hour(s))  Aerobic culture     Status: Abnormal   Collection Time: 10/16/22 10:50 AM  Result Value Ref Range Status   Aerobic Bacterial Culture Final report (A)  Final   Organism ID, Bacteria Serratia marcescens (A)  Final    Comment: Heavy growth   Antimicrobial Susceptibility Comment  Final    Comment:       ** S = Susceptible; I = Intermediate; R = Resistant **                    P =  Positive; N = Negative             MICS are expressed in micrograms per mL    Antibiotic                 RSLT#1    RSLT#2    RSLT#3    RSLT#4 Amoxicillin/Clavulanic Acid    R Cefazolin                      R Cefepime                       S Ceftriaxone                    S Cefuroxime                     R Ciprofloxacin                  S Ertapenem                      S Gentamicin                     S Levofloxacin                   S Meropenem                      S Tetracycline                   R Tobramycin                     S Trimethoprim/Sulfa             S   Resp panel by RT-PCR (RSV, Flu A&B,  Covid) Anterior Nasal Swab     Status: None   Collection Time: 10/22/22  4:03 PM   Specimen: Anterior Nasal Swab  Result Value Ref Range Status   SARS Coronavirus 2 by RT PCR NEGATIVE NEGATIVE Final    Comment: (NOTE) SARS-CoV-2 target nucleic acids are NOT DETECTED.  The SARS-CoV-2 RNA is generally detectable in upper respiratory specimens during the acute phase of infection. The lowest concentration of SARS-CoV-2 viral copies this assay can detect is 138 copies/mL. A negative result does not preclude SARS-Cov-2 infection and should not be used as the sole basis for treatment or other patient management decisions. A negative result may occur with  improper specimen collection/handling, submission of specimen other than nasopharyngeal swab, presence of viral mutation(s) within the areas targeted by this assay, and inadequate number of viral copies(<138 copies/mL). A negative result must be combined with clinical observations, patient history, and epidemiological information. The expected result is Negative.  Fact Sheet for Patients:  BloggerCourse.com  Fact Sheet for Healthcare Providers:  SeriousBroker.it  This test is no t yet approved or cleared by the [place] FDA and  has been authorized for detection and/or diagnosis of SARS-CoV-2 by FDA under an Emergency Use Authorization (EUA). This EUA will remain  in effect (meaning this test can be used) for the duration of the COVID-19 declaration under Section 564(b)(1) of the Act, 21 U.S.C.section 360bbb-3(b)(1), unless the authorization is terminated  or revoked sooner.       Influenza A by PCR NEGATIVE NEGATIVE Final   Influenza B by PCR NEGATIVE NEGATIVE Final    Comment: (  NOTE) The Xpert Xpress SARS-CoV-2/FLU/RSV plus assay is intended as an aid in the diagnosis of influenza from Nasopharyngeal swab specimens and should not be used as a sole basis for treatment. Nasal  washings and aspirates are unacceptable for Xpert Xpress SARS-CoV-2/FLU/RSV testing.  Fact Sheet for Patients: BloggerCourse.com  Fact Sheet for Healthcare Providers: SeriousBroker.it  This test is not yet approved or cleared by the [place] FDA and has been authorized for detection and/or diagnosis of SARS-CoV-2 by FDA under an Emergency Use Authorization (EUA). This EUA will remain in effect (meaning this test can be used) for the duration of the COVID-19 declaration under Section 564(b)(1) of the Act, 21 U.S.C. section 360bbb-3(b)(1), unless the authorization is terminated or revoked.     Resp Syncytial Virus by PCR NEGATIVE NEGATIVE Final    Comment: (NOTE) Fact Sheet for Patients: BloggerCourse.com  Fact Sheet for Healthcare Providers: SeriousBroker.it  This test is not yet approved or cleared by the [place] FDA and has been authorized for detection and/or diagnosis of SARS-CoV-2 by FDA under an Emergency Use Authorization (EUA). This EUA will remain in effect (meaning this test can be used) for the duration of the COVID-19 declaration under Section 564(b)(1) of the Act, 21 U.S.C. section 360bbb-3(b)(1), unless the authorization is terminated or revoked.  Performed at [facility], [address], [place], [place] [address]          Radiology Studies: CT ABDOMEN PELVIS W CONTRAST  Result Date: 10/22/2022 CLINICAL DATA:  Abdominal pain, most pronounced in the left lower quadrant and suprapubic regions. Vomiting, fever. EXAM: CT ABDOMEN AND PELVIS WITH CONTRAST TECHNIQUE: Multidetector CT imaging of the abdomen and pelvis was performed using the standard protocol following bolus administration of intravenous contrast. RADIATION DOSE REDUCTION: This exam was performed according to the departmental dose-optimization program which includes automated  exposure control, adjustment of the mA and/or kV according to patient size and/or use of iterative reconstruction technique. CONTRAST:  OMNIPAQUE IOHEXOL 300 MG/ML  SOLN COMPARISON:  12/12/2004 FINDINGS: Lower chest: 6 no acute abnormality Hepatobiliary: No focal liver abnormality is seen. Status post cholecystectomy. No biliary dilatation. Pancreas: No focal abnormality or ductal dilatation. Spleen: No focal abnormality.  Normal size. Adrenals/Urinary Tract: No adrenal abnormality. No focal renal abnormality. No stones or hydronephrosis. Urinary bladder is unremarkable. Stomach/Bowel: Stomach, large and small bowel grossly unremarkable. Vascular/Lymphatic: Aortic atherosclerosis. No evidence of aneurysm or adenopathy. Reproductive: Uterus and adnexa unremarkable.  No mass. Other: Large volume ascites in the abdomen and pelvis. Omental thickening/caking noted anteriorly. Appearance is concerning for peritoneal carcinomatosis, often seen with ovarian cancer. However, no ovarian mass visualized. Musculoskeletal: No acute bony abnormality. IMPRESSION: Large volume ascites in the abdomen or pelvis with omental caking. Appearance is concerning for peritoneal malignancy/carcinomatosis, often seen with ovarian cancer, but no ovarian mass visualized. Aortic atherosclerosis. Electronically Signed   By: Charlett Nose M.D.   On: 10/22/2022 19:41        Scheduled Meds:  acidophilus  1 capsule Oral Daily   aspirin EC  81 mg Oral Daily   carvedilol  6.25 mg Oral BID   insulin aspart  0-20 Units Subcutaneous Q4H   insulin aspart protamine- aspart  20 Units Subcutaneous BID WC   iron polysaccharides  150 mg Oral Daily   montelukast  10 mg Oral QHS   pantoprazole (PROTONIX) IV  40 mg Intravenous Q24H   venlafaxine XR  150 mg Oral QHS   zinc sulfate  220 mg Oral Daily  Continuous Infusions:  cefTRIAXone (ROCEPHIN)  IV     promethazine (PHENERGAN) injection (IM or IVPB)       LOS: 1 day      Tresa Moore, MD [facility]   If 7PM-7AM, please contact night-coverage  10/23/2022, 11:49 AM

## 2022-10-23 NOTE — Assessment & Plan Note (Signed)
Peritoneal caking on CT/ ?Carcinomatosis Intractable vomiting SIRS SIRS criteria include leukocytosis and hypotension with temp of 100.6, suspect related to vomiting versus infection CT showing large volume ascites with omental caking concerning for peritoneal malignancy-carcinomatosis but no mass IR consulted for consideration of paracentesis-n.p.o. overnight and hold Lovenox prophylaxis Consider oncology/ID/GI consults Will get procalcitonin Empiric Rocephin for possible SBP Symptom control with IV pain meds, antiemetics

## 2022-10-23 NOTE — Progress Notes (Signed)
Pt's blood glucose-59, given juice per protocol, recheck 71. Currently eating a snack . Looks like a few hypoglycemic  episodes over the past couple of days.  Poor po intake at times due to persistent abd pain and nausea. Currently Q4 blood sugar checks. Dr. Arville Care notified, new orders noted.

## 2022-10-23 NOTE — TOC Initial Note (Signed)
Transition of Care [facility]) - Initial/Assessment Note    Patient Details  Name: Nancy [name] MRN: [id number] Date of Birth: 26-Oct-1965  Transition of Care [facility]) CM/SW Contact:    Chapman Fitch, RN Phone Number: 10/23/2022, 2:04 PM  Clinical Narrative:                  Per [place] patient is active with [facility] for PT/OT  Patient did have RN, but was d/c after completion of home IV antibiotics.  Per TOC note from September 2024 patient receive sliding board, baratric WC, bariatric drop arm BSC and was to purchase a RW       Patient Goals and CMS Choice            Expected Discharge Plan and Services                                              Prior Living Arrangements/Services                       Activities of Daily Living   ADL Screening (condition at time of admission) Independently performs ADLs?: Yes (appropriate for developmental age) Is the patient deaf or have difficulty hearing?: No Does the patient have difficulty seeing, even when wearing glasses/contacts?: No Does the patient have difficulty concentrating, remembering, or making decisions?: No  Permission Sought/Granted                  Emotional Assessment              Admission diagnosis:  Generalized abdominal pain [R10.84] Ascites [R18.8] Fever, unspecified fever cause [R50.9] Nausea and vomiting, unspecified vomiting type [R11.2] Patient Active Problem List   Diagnosis Date Noted   Ascites, possibly malignant 10/23/2022   Hypotension 10/22/2022   Intractable vomiting 10/22/2022   History of MSSA bacteremia due to osteomyelitis L foot 09/18/22 10/22/2022   S/P left  BKA 09/24/22 secondary to osteomyelitis (below knee amputation) (HCC) 10/22/2022   AKI (acute kidney injury) (HCC) 10/22/2022   Diarrhea 10/22/2022   Obesity, Class III, BMI 40-49.9 (morbid obesity) (HCC) 10/22/2022   Adjustment disorder with mixed anxiety and depressed mood 09/26/2022    MSSA bacteremia 09/20/2022   Diabetic foot infection (HCC) 09/18/2022   Osteomyelitis (HCC) 09/18/2022   Cellulitis of left lower extremity 09/18/2022   Gas gangrene (HCC) 09/18/2022   B12 deficiency due to diet 05/16/2022   Vitamin D deficiency, unspecified 05/16/2022   Diabetic ulcer of left heel associated with type 2 diabetes mellitus (HCC) 02/08/2021   Atherosclerosis of native arteries of the extremities with ulceration (HCC) 01/30/2021   Diabetes mellitus, type 2 (HCC) 03/13/2015   Sepsis secondary to diabetic foot infection 03/13/2015   Allergic rhinitis 05/10/2014   Asthma without status asthmaticus 05/10/2014   Atherosclerosis of native coronary artery of native heart with angina pectoris (HCC) 05/10/2014   Clinical depression 05/10/2014   Neuropathy, diabetic (HCC) 05/10/2014   Acid reflux 05/10/2014   Mixed hyperlipidemia 05/10/2014   Headache, migraine 05/10/2014   Obesity (BMI 30-39.9) 05/10/2014   Takotsubo cardiomyopathy 05/10/2014   Essential hypertension 08/09/2008   Heart failure (HCC) 06/06/2008   PCP:  Miki Kins, FNP Pharmacy:   [facility] [address], [place] - [address] [place] [address] Phone: [phone] Fax: [phone]  Social Determinants of Health (SDOH) Social History: SDOH Screenings   Food Insecurity: No Food Insecurity (10/22/2022)  Housing: High Risk (10/22/2022)  Transportation Needs: No Transportation Needs (10/22/2022)  Utilities: Not At Risk (10/22/2022)  Depression (PHQ2-9): Low Risk  (10/10/2022)  Tobacco Use: Medium Risk (10/22/2022)   SDOH Interventions:     Readmission Risk Interventions     No data to display

## 2022-10-24 ENCOUNTER — Encounter (INDEPENDENT_AMBULATORY_CARE_PROVIDER_SITE_OTHER): Payer: Self-pay | Admitting: Nurse Practitioner

## 2022-10-24 DIAGNOSIS — R188 Other ascites: Secondary | ICD-10-CM | POA: Diagnosis not present

## 2022-10-24 LAB — GLUCOSE, CAPILLARY
Glucose-Capillary: 120 mg/dL — ABNORMAL HIGH (ref 70–99)
Glucose-Capillary: 120 mg/dL — ABNORMAL HIGH (ref 70–99)
Glucose-Capillary: 141 mg/dL — ABNORMAL HIGH (ref 70–99)
Glucose-Capillary: 137 mg/dL — ABNORMAL HIGH (ref 70–99)
Glucose-Capillary: 134 mg/dL — ABNORMAL HIGH (ref 70–99)
Glucose-Capillary: 133 mg/dL — ABNORMAL HIGH (ref 70–99)

## 2022-10-24 MED ORDER — DIPHENHYDRAMINE HCL 25 MG PO CAPS
25.0000 mg | ORAL_CAPSULE | ORAL | Status: DC | PRN
  Administered 2022-10-24: 25 mg via ORAL
  Filled 2022-10-24: qty 1

## 2022-10-24 NOTE — Discharge Instructions (Signed)
Rent/Utility/Housing  Agency Name: [facility] Address: [address] [place], [place] [address] Phone: [phone] Email: [email or web address] Website: [email or web address] Service(s) Offered: Housing services, self-sufficiency, congregate meal program, weatherization program, Field seismologist program, emergency food assistance,  housing counseling, home ownership program, wheels -towork program.  Agency Name: [facility] Address: [address], [place], [place] [address] Phone: [phone] (8a-4p) [phone] (8p- 10p) Email: [email or web address] Website: [email or web address] Service(s) Offered: A program for homeless and/or needy men that includes one-on-one counseling, life skills training and job rehabilitation.  Agency Name: [facility] Address: [address], [place], [place] [address] Phone: [phone] Website: www.[email or web address].org Service(s) Offered: Assistance to needy in emergency with utility bills, heating fuel, and prescriptions. Shelter for homeless 7pm-7am. May 02, 2016 15  Agency Name: [facility] [place] (Developmentally Disabled) Address: [address], [place], [place] [address] Phone: [phone] Contact Person: Cathleen Corti Email: [email or web address] Website: [email or web address] Service(s) Offered: Helps individuals with developmental disabilities move from housing that is more restrictive to homes where they  can achieve greater independence and have more  opportunities.  Agency Name: [facility] Address: [address], [place], [place] [address] Phone: [phone] Email: [email or web address] Website: [email or web address].org Service(s) Offered: Provides affordable housing for low-income families, elderly, and disabled individuals. Offer a wide range of  programs and services, from financial planning to  afterschool and summer programs.  Agency Name: [facility] Address: [address] [place], [place] [address] Phone: [phone] Service(s) Offered: Child support services; child welfare services; food stamps; Medicaid; work first family assistance; and aid with fuel,  rent, food and medicine.  Agency Name: [facility] [place] [employer]. Address: [facility] [address] [place], [place]  [address] Phone: [phone] Website: www.familyabuseservices.org Service(s) Offered: 24 hour Crisis Line: [phone]; 24 hour Emergency Shelter; Transitional Housing; Support Groups; Scientist, physiological; [employer]; Hispanic Outreach: [phone];  Visitation Center: [phone].  Agency Name: [facility] [place]. Address: [address], [place], [place] [address] Phone: [phone] Service(s) Offered: CAP Services; Home and [employer]; Individual or Group Supports; Respite Care Non-Institutional Nursing;  Residential Supports; Respite Care and Personal Care Services; Transportation; Family and Friends Night; Recreational Activities; Three Nutritious Meals/Snacks; Consultation with Registered Dietician; Twenty-four hour Registered Nurse Access; Daily and [employer]; Camp Green Leaves; [place] for the [employer] (During Summer Months) Bingo Night (Every  Wednesday Night); Special Populations Dance Night  (Every Tuesday Night); Professional Hair Care Services.  Agency Name: [facility] Address: [address], [place], [place] [address] Phone: [phone] Contact Person: Jabier Mutton Website: [email or web address].Physicist, medical) Offered: Residential treatment facility for women; food and  clothing, educational & employment development and  transportation to work; Counsellor of financial skills;  parenting and family reunification; emotional and spiritual  support;  transitional housing for program graduates.  Agency Name: [facility] Address: [address], [place], [place] [address] Phone: [phone] Email: [email or web address] Website: [email or web address].es Service(s) Offered: Public housing units for elderly, disabled, and low income people; housing choice vouchers for income eligible  applicants; shelter plus care vouchers; and Psychologist, clinical.  Agency Name: [facility] [employer] Address: [address], [place], [place] [address] Phone: [phone] Email: [email or web address] Website: www.habitatalamance.org Service(s) Offered: Build houses for families in need of decent housing. Each adult in the family must invest 200 hours of labor on  someone else's house, work with volunteers to build their own house, attend classes  on budgeting, home maintenance, yard care, and attend homeowner association meetings.  Agency Name: [facility] Address: [address], [place], [place] [address] Phone: [phone] Website: www.rsli.org Service(s) Offered: Intermediate care facilities for intellectually delayed, Supervised Living in group homes for adults with developmental disabilities, Supervised Living for people who have dual diagnoses (MRMI), Independent Living, Supported Living, respite and a variety of CAP services, pre-vocational services, day supports, and [employer].  Agency Name: [place] [employer] Phone: [phone] Website: www.NCForeclosurePrevention.gov Service(s) Offered: Zero-interest, deferred loans to homeowners struggling to pay their mortgage. Call for more information.

## 2022-10-24 NOTE — Progress Notes (Signed)
Subjective:    Patient ID: Nancy [name], female    DOB: 1965/06/01, 57 y.o.   MRN: [id number] Chief Complaint  Patient presents with   Routine Post [facility] 2 week follow up    The patient is a 57 year old female who presents today for wound evaluation after left below-knee amputation on 09/24/2022 following gangrenous changes of her left foot.  There are no staples to removed.  The patient notes that she has previously fallen on the site.  There is some scabbing along the incision with just a small amount of scant drainage of the medial port she denies any significant pain.  Overall she is progressing well    Review of Systems  Musculoskeletal:  Positive for gait problem.  Skin:  Positive for wound.  All other systems reviewed and are negative.      Objective:   Physical Exam Vitals reviewed.  HENT:     Head: Normocephalic.  Cardiovascular:     Rate and Rhythm: Normal rate and regular rhythm.  Pulmonary:     Effort: Pulmonary effort is normal.  Skin:    General: Skin is warm and dry.  Neurological:     Mental Status: She is alert and oriented to person, place, and time.     Gait: Gait abnormal.  Psychiatric:        Mood and Affect: Mood normal.        Behavior: Behavior normal.        Thought Content: Thought content normal.        Judgment: Judgment normal.     BP 136/69 (BP Location: Left Arm)   Pulse 79   Resp 16   LMP 02/08/2013 (Within Months)   Past Medical History:  Diagnosis Date   Abnormal cardiovascular stress test 09/21/2018   Formatting of this note might be different from the original. Lexiscan Myoview 09/16/2018 revealed mild anterior ischemia   Adverse effect of motion 05/10/2014   Anxiety    Arthritis    r knee   Asthma    Breast cyst 05/10/2014   CAD (coronary artery disease)    a.) LHC 06/04/2009 at [facility]; non-obstructive CAD. b.) CTA with FFR 10/08/2018: extensive mixed plaque proximal to mid LAD (51-69%); Coronary Ca score 217; FFR  0.71 dPDA, 0.86 mLAD, 0.87 dLCx.   CCF (congestive cardiac failure) (HCC) 06/06/2008   a.) 30% EF. b.) TTE 06/03/2011: EF >55%; triv MR. c.) TTE 04/27/2018: EF 55%; mild LVH; triv PR, mild MR/TR; G1DD.   Chest pain with high risk for cardiac etiology 07/02/2016   Chronic use of opiate drug for therapeutic purpose 02/13/2022   Formatting of this note might be different from the original. [facility] Pain Contract signed on 04/18/16 & updated 03/04/17; UDS done on 04/18/16.   Complication of anesthesia    Diabetic foot infection (HCC) 03/13/2015   Diabetic foot ulcer (HCC) 02/08/2021   Eczema    Family history of adverse reaction to anesthesia    a.) PONV in mother and grandmother   GERD (gastroesophageal reflux disease) 05/10/2014   History of kidney stones    HLD (hyperlipidemia)    Hyponatremia 03/13/2015   Migraines    Motion sickness    Other acute osteomyelitis, left ankle and foot (HCC) 02/13/2022   Panic attacks    Peripheral edema 04/27/2018   Pneumonia    PONV (postoperative nausea and vomiting)    Sepsis secondary to diabetic foot infection 03/13/2015   T2DM (type 2  diabetes mellitus) (HCC)    Takotsubo cardiomyopathy / transient apical balooning syndrome / stress-induced cardiomyopathy 05/10/2014   Unspecified essential hypertension     Social History   Socioeconomic History   Marital status: Married    Spouse name: Not on file   Number of children: Not on file   Years of education: Not on file   Highest education level: Not on file  Occupational History   Not on file  Tobacco Use   Smoking status: Former    Current packs/day: 0.00    Types: Cigarettes    Quit date: 03/12/1985    Years since quitting: 37.6   Smokeless tobacco: Never  Vaping Use   Vaping status: Never Used  Substance and Sexual Activity   Alcohol use: No   Drug use: No   Sexual activity: Yes    Birth control/protection: Post-menopausal  Other Topics Concern   Not on file  Social History Narrative    Full time. Does not regularly exercise. Domestic partner.    Social Determinants of Health   Financial Resource Strain: Not on file  Food Insecurity: No Food Insecurity (10/22/2022)   Hunger Vital Sign    Worried About Running Out of Food in the Last Year: Never true    Ran Out of Food in the Last Year: Never true  Transportation Needs: No Transportation Needs (10/22/2022)   PRAPARE - Administrator, Civil Service (Medical): No    Lack of Transportation (Non-Medical): No  Physical Activity: Not on file  Stress: Not on file  Social Connections: Not on file  Intimate Partner Violence: Not At Risk (10/22/2022)   Humiliation, Afraid, Rape, and Kick questionnaire    Fear of Current or Ex-Partner: No    Emotionally Abused: No    Physically Abused: No    Sexually Abused: No    Past Surgical History:  Procedure Laterality Date   AMPUTATION Left 09/19/2022   Procedure: AMPUTATION BELOW KNEE;  Surgeon: Renford Dills, MD;  Location: [facility];  Service: Vascular;  Laterality: Left;   AMPUTATION Left 09/24/2022   Procedure: AMPUTATION BELOW KNEE WITH WOUND CLOSURE;  Surgeon: Renford Dills, MD;  Location: [facility];  Service: Vascular;  Laterality: Left;   AMPUTATION TOE Left 03/16/2015   Procedure: left fifth toe amputation with incision and drainage;  Surgeon: Gwyneth Revels, DPM;  Location: [facility];  Service: Podiatry;  Laterality: Left;   APPLICATION OF WOUND VAC Left 02/14/2021   Procedure: APPLICATION OF WOUND VAC;  Surgeon: Gwyneth Revels, DPM;  Location: [facility];  Service: Podiatry;  Laterality: Left;   APPLICATION OF WOUND VAC Left 04/16/2021   Procedure: APPLICATION OF WOUND VAC;  Surgeon: Peggye Form, DO;  Location: [facility];  Service: Plastics;  Laterality: Left;   CARDIAC CATHETERIZATION Left 06/04/2009   Procedure: CARDIAC CATHETERIZATION; Location: [facility]   CHOLECYSTECTOMY     DEBRIDEMENT AND CLOSURE WOUND Left  04/16/2021   Procedure: DEBRIDEMENT AND CLOSURE WOUND;  Surgeon: Peggye Form, DO;  Location: [facility];  Service: Plastics;  Laterality: Left;  1 hour   IRRIGATION AND DEBRIDEMENT FOOT Left 02/08/2021   Procedure: IRRIGATION AND DEBRIDEMENT FOOT - LFT HEEL ULCER;  Surgeon: Gwyneth Revels, DPM;  Location: [facility];  Service: Podiatry;  Laterality: Left;   IRRIGATION AND DEBRIDEMENT FOOT Left 02/14/2021   Procedure: IRRIGATION AND DEBRIDEMENT LEFT HEEL;  Surgeon: Gwyneth Revels, DPM;  Location: [facility];  Service: Podiatry;  Laterality: Left;  kidney stone removal     KNEE ARTHROSCOPY W/ MENISCAL REPAIR     NOSE SURGERY  01/07/1989   due to fracture   TEE WITHOUT CARDIOVERSION N/A 09/26/2022   Procedure: TRANSESOPHAGEAL ECHOCARDIOGRAM;  Surgeon: Clotilde Dieter, DO;  Location: [facility];  Service: Cardiovascular;  Laterality: N/A;   TOE AMPUTATION     second toe   TONSILLECTOMY     TOTAL KNEE ARTHROPLASTY  01/11/2011   Procedure: TOTAL KNEE ARTHROPLASTY;  Surgeon: Harvie Junior;  Location: [facility];  Service: Orthopedics;  Laterality: Right;  COMPUTER ASSISTED TOTAL KNEE REPLACEMENT    Family History  Problem Relation Age of Onset   Transient ischemic attack Mother    Asthma Mother    Hypertension Mother    Lung cancer Father    Hypertension Father    Asthma Sister    Anxiety disorder Sister    Stomach cancer Maternal Grandmother    Colon cancer Maternal Grandmother    Leukemia Maternal Grandmother    Melanoma Maternal Grandfather    Asthma Sister    Hypertension Brother    Cancer Other    Crohn's disease Other    GER disease Other    Breast cancer Neg Hx     Allergies  Allergen Reactions   Shellfish Allergy Anaphylaxis   Codeine Other (See Comments)    Migraine  Other Reaction(s): Other (See Comments)  Reaction:  Severe migraines   Morphine And Codeine Other (See Comments)    Reaction:  Severe migraines    Percocet [Oxycodone-Acetaminophen] Nausea  And Vomiting   Rosuvastatin     Other reaction(s): Muscle Pain   Sulfa Antibiotics Rash   Theophyllines Itching and Rash       Latest Ref Rng & Units 10/23/2022    5:10 AM 10/22/2022    3:26 PM 09/30/2022    6:10 AM  CBC  WBC 4.0 - 10.5 K/uL 9.7  11.7  9.5   Hemoglobin 12.0 - 15.0 g/dL 9.2  9.8  8.0   Hematocrit 36.0 - 46.0 % 29.1  31.4  25.9   Platelets 150 - 400 K/uL 537  552  499       CMP     Component Value Date/Time   NA 132 (L) 10/23/2022 0510   NA 132 (L) 08/07/2022 1210   NA 133 (L) 08/21/2012 1523   K 4.8 10/23/2022 0510   K 4.1 08/21/2012 1523   CL 99 10/23/2022 0510   CL 102 08/21/2012 1523   CO2 24 10/23/2022 0510   CO2 26 08/21/2012 1523   GLUCOSE 127 (H) 10/23/2022 0510   GLUCOSE 306 (H) 08/21/2012 1523   BUN 48 (H) 10/23/2022 0510   BUN 23 08/07/2022 1210   BUN 17 08/21/2012 1523   CREATININE 1.13 (H) 10/23/2022 0510   CREATININE 0.69 08/21/2012 1523   CALCIUM 8.3 (L) 10/23/2022 0510   CALCIUM 8.8 08/21/2012 1523   PROT 7.3 10/23/2022 0510   PROT 7.0 08/07/2022 1210   PROT 7.1 08/21/2012 1523   ALBUMIN 2.6 (L) 10/23/2022 0510   ALBUMIN 4.0 08/07/2022 1210   ALBUMIN 3.4 08/21/2012 1523   AST 13 (L) 10/23/2022 0510   AST 19 08/21/2012 1523   ALT 7 10/23/2022 0510   ALT 25 08/21/2012 1523   ALKPHOS 53 10/23/2022 0510   ALKPHOS 73 08/21/2012 1523   BILITOT 0.5 10/23/2022 0510   BILITOT 0.3 08/07/2022 1210   BILITOT 0.2 08/21/2012 1523   EGFR 81 08/07/2022 1210   GFRNONAA 57 (  L) 10/23/2022 0510   GFRNONAA >60 08/21/2012 1523     No results found.     Assessment & Plan:   1. Hx of AKA (above knee amputation), left (HCC) Today the wound is healing largely without any significant issue.  There is a very small amount of drainage in the medial portion.  The patient is already on IV antibiotics currently we will culture to make sure that it has been fully covered.  Will plan to have the patient return in approximately 2 weeks to evaluate  wound.   No current facility-administered medications on file prior to visit.   Current Outpatient Medications on File Prior to Visit  Medication Sig Dispense Refill   acidophilus (RISAQUAD) CAPS capsule Take 1 capsule by mouth daily.     albuterol (PROVENTIL HFA;VENTOLIN HFA) 108 (90 Base) MCG/ACT inhaler Inhale 2 puffs into the lungs every 4 (four) hours as needed for wheezing or shortness of breath. Reported on 03/29/2015 1 Inhaler 3   albuterol (PROVENTIL) (2.5 MG/3ML) 0.083% nebulizer solution Take 2.5 mg by nebulization as needed.     ALPRAZolam (XANAX) 0.5 MG tablet TAKE 1/2 TO 1 TABLET BY MOUTH AS NEEDED FOR PANIC ATTACKS 30 tablet 3   amLODipine (NORVASC) 5 MG tablet Take 1 tablet (5 mg total) by mouth daily. 30 tablet 2   ascorbic acid (VITAMIN C) 500 MG tablet Take 1 tablet (500 mg total) by mouth 2 (two) times daily. 60 tablet 0   aspirin EC 81 MG tablet Take 81 mg by mouth daily.     aspirin-acetaminophen-caffeine (EXCEDRIN MIGRAINE) 250-250-65 MG tablet Take 2 tablets by mouth every 6 (six) hours as needed for migraine.     carvedilol (COREG) 6.25 MG tablet TAKE 1 TABLET BY MOUTH TWICE A DAY 180 tablet 1   Continuous Blood Gluc Sensor (FREESTYLE LIBRE 2 SENSOR) MISC APPLY ONE SENSOR EVERY 2 WEEKS.     Ensure Max Protein (ENSURE MAX PROTEIN) LIQD Take 330 mLs (11 oz total) by mouth 2 (two) times daily.     fenofibrate 54 MG tablet TAKE 1 TABLET BY MOUTH EVERY DAY 90 tablet 3   fluconazole (DIFLUCAN) 100 MG tablet Take one tablet by mouth on day 1 and second tablet by mouth on day 4 2 tablet 1   insulin NPH-regular Human (70-30) 100 UNIT/ML injection Inject 50 Units into the skin 2 (two) times daily with a meal.     iron polysaccharides (NIFEREX) 150 MG capsule Take 1 capsule (150 mg total) by mouth daily. 30 capsule 2   lisinopril (ZESTRIL) 5 MG tablet TAKE 1 TABLET BY MOUTH EVERY DAY (Patient taking differently: Take 5 mg by mouth 2 (two) times daily.) 90 tablet 1   metFORMIN  (GLUCOPHAGE) 500 MG tablet TAKE TWO TABLETS EACH MORNING. AND 3 TABLETS IN THE EVENING AS DIRECTED 150 tablet 5   methocarbamol (ROBAXIN) 500 MG tablet Take 1 tablet (500 mg total) by mouth every 8 (eight) hours as needed for muscle spasms. 30 tablet 0   montelukast (SINGULAIR) 10 MG tablet TAKE 1 TABLET BY MOUTH EVERYDAY AT BEDTIME 90 tablet 1   Multiple Vitamin (MULTIVITAMIN WITH MINERALS) TABS tablet Take 1 tablet by mouth daily. 30 tablet 2   nystatin (MYCOSTATIN/NYSTOP) powder APPLY TOPICALLY TWICE DAILY AS NEEDED 30 g 3   ondansetron (ZOFRAN-ODT) 4 MG disintegrating tablet Take 4 mg by mouth every 8 (eight) hours as needed.     pantoprazole (PROTONIX) 40 MG tablet TAKE 1 TABLET BY MOUTH EVERY  DAY 90 tablet 3   pravastatin (PRAVACHOL) 40 MG tablet TAKE 1 TABLET BY MOUTH EVERY DAY 30 tablet 5   traMADol (ULTRAM) 50 MG tablet Take 50 mg by mouth every 6 (six) hours as needed (for migraines).     zinc sulfate 220 (50 Zn) MG capsule Take 1 capsule (220 mg total) by mouth daily. 30 capsule 0    There are no Patient Instructions on file for this visit. No follow-ups on file.   Georgiana Spinner, NP

## 2022-10-24 NOTE — Progress Notes (Signed)
Pt reports generalized itching, no visible rash or skin irritation noted. Pt received IV rocephin at bedtime (2nd dose). Otherwise no new medications given. Requesting something for itching. Dr. Arville Care notified, new order noted.

## 2022-10-24 NOTE — TOC Progression Note (Signed)
Transition of Care [facility]) - Progression Note    Patient Details  Name: Nancy [name] MRN: [id number] Date of Birth: Feb 14, 1965  [facility]) CM/SW Contact  Chapman Fitch, RN Phone Number: 10/24/2022, 10:35 AM  Clinical Narrative:     Housing resources added to AVS Per MD it is not anticipated that patient will need IV antibiotics at discharge        Expected Discharge Plan and Services                                               Social Determinants of Health (SDOH) Interventions SDOH Screenings   Food Insecurity: No Food Insecurity (10/22/2022)  Housing: High Risk (10/22/2022)  Transportation Needs: No Transportation Needs (10/22/2022)  Utilities: Not At Risk (10/22/2022)  Depression (PHQ2-9): Low Risk  (10/10/2022)  Tobacco Use: Medium Risk (10/24/2022)    Readmission Risk Interventions     No data to display

## 2022-10-24 NOTE — Progress Notes (Signed)
PROGRESS NOTE    Nancy [name]  XBJ:[id number] DOB: [birth year] DOA: 10/22/2022 PCP: Miki Kins, FNP    Brief Narrative:  57 y.o. female who presented with abdominal pain with CT showing large volume ascites and omental caking concerning for peritoneal carcinomatosis.  Patient has past medical history significant for type 2 diabetes, CAD, asthma, chronic pain, diabetic foot infection, GERD, hyperlipidemia, Takotsubo cardiomyopathy hospitalized from 9/11 to 09/30/2022 with sepsis secondary to diabetic foot infection/osteomyelitis s/p left BKA 9/17, also found to have Staph aureus bacteremia with TEE negative for vegetations, discharged home with 2 weeks of IV cefazolin, completed a few days ago, who presents to the ED with a 6-day history of nonbloody nonbilious vomiting Associated with generalized abdominal pain and distention.  States during her recent past hospitalization she initially presented with bleeding from her rectum but it spontaneously resolved.  She had abdominal pain and nausea but she attributed it to her foot infection.  She denies a history of unintentional weight loss or poor appetite except for when her vomiting started a week ago.   She denies cough, chest pain, shortness of breath, leg pain, black or bloody stool except for one-time episode prior to her last hospitalization and denies, dysuria.   Status post ultrasound-guided paracentesis on 10/16.  3.9 L yellow fluid removed  Assessment & Plan:   Principal Problem:   Ascites, possibly malignant Active Problems:   Intractable vomiting   Hypotension   Essential hypertension   AKI (acute kidney injury) (HCC)   History of MSSA bacteremia due to osteomyelitis L foot 09/18/22   S/P left  BKA 09/24/22 secondary to osteomyelitis (below knee amputation) (HCC)   Diarrhea   Asthma without status asthmaticus   Clinical depression   Takotsubo cardiomyopathy   Diabetes mellitus, type 2 (HCC)   Obesity, Class III, BMI  40-49.9 (morbid obesity) (HCC)   Ascites, possibly malignant Peritoneal caking on CT/ ?Carcinomatosis Intractable vomiting SIRS SIRS criteria include leukocytosis and hypotension with temp of 100.6, suspect related to vomiting versus infection CT showing large volume ascites with omental caking concerning for peritoneal malignancy-carcinomatosis but no mass Status post paracentesis with 3.9 L fluid removed on 10/16 No evidence of SBP Plan: DC Rocephin.  SBP ruled out Symptom management with narcotics and antiemetics Follow cytology from paracentesis  Hypotension Essential hypertension Secondary to volume depletion from intractable vomiting Hold antihypertensives DC IVF   AKI (acute kidney injury) (HCC) Prerenal/ATN from volume depletion related to intractable vomiting Creatinine 1.19 up from recent baseline of 0.59 Creatinine improving.  DC   Diarrhea Resolved low suspicion for infectious diarrhea.  Infectious workup discontinued   History of MSSA bacteremia due to osteomyelitis L foot 09/18/22 S/p left BKA 9/17 secondary to osteomyelitis Patient completed cefazolin a couple weeks prior No acute issues suspected TEE on 9/17 was negative for vegetations   Obesity, Class III, BMI 40-49.9 (morbid obesity) (HCC) Complicating factor to overall prognosis and care   Diabetes mellitus, type 2 (HCC) Sliding scale insulin coverage   Clinical depression Continue home venlafaxine and alprazolam   Asthma without status asthmaticus Not acutely exacerbated Continue Singulair Albuterol as needed    DVT prophylaxis: SCD Code Status: Full Family Communication: Spouse and mother at bedside 10/16, 10/17 Disposition Plan: Status is: Inpatient Remains inpatient appropriate because: Ascites of unclear etiology.  Workup in progress   Level of care: Telemetry Medical  Consultants:  Interventional radiology  Procedures:  Ultrasound-guided  paracentesis  Antimicrobials:   Subjective: Seen and examined.  Resting in bed.  Does report some relief in pain after the paracentesis however seems to be recurring now  Objective: Vitals:   10/23/22 1928 10/24/22 0411 10/24/22 0415 10/24/22 0816  BP: 116/66  127/73 (!) 143/78  Pulse: 75  78 82  Resp: 18  18 16   Temp: 98.7 F (37.1 C)  98.8 F (37.1 C) 98.8 F (37.1 C)  TempSrc: Oral  Oral Oral  SpO2: 93%  96% 97%  Weight:  117.8 kg    Height:        Intake/Output Summary (Last 24 hours) at 10/24/2022 1201 Last data filed at 10/23/2022 2106 Gross per 24 hour  Intake 240 ml  Output --  Net 240 ml   Filed Weights   10/22/22 1451 10/24/22 0411  Weight: 122 kg 117.8 kg    Examination:  General exam: No acute distress Respiratory system: Clear to auscultation. Respiratory effort normal. Cardiovascular system: S1-S2, RRR, no murmurs, no pedal edema Gastrointestinal system: Obese, soft, distended, tender to palpation Central nervous system: Alert and oriented. No focal neurological deficits. Extremities: Left lower extremity BKA Skin: No rashes, lesions or ulcers Psychiatry: Judgement and insight appear normal. Mood & affect appropriate.     Data Reviewed: I have personally reviewed following labs and imaging studies  CBC: Recent Labs  Lab 10/22/22 1526 10/23/22 0510  WBC 11.7* 9.7  NEUTROABS 8.7*  --   HGB 9.8* 9.2*  HCT 31.4* 29.1*  MCV 91.3 90.4  PLT 552* 537*   Basic Metabolic Panel: Recent Labs  Lab 10/22/22 1526 10/23/22 0510  NA 133* 132*  K 5.1 4.8  CL 97* 99  CO2 24 24  GLUCOSE 125* 127*  BUN 54* 48*  CREATININE 1.19* 1.13*  CALCIUM 8.8* 8.3*   GFR: Estimated Creatinine Clearance: 75.3 mL/min (A) (by C-G formula based on SCr of 1.13 mg/dL (H)). Liver Function Tests: Recent Labs  Lab 10/22/22 1526 10/23/22 0510  AST 14* 13*  ALT 7 7  ALKPHOS 56 53  BILITOT 0.6 0.5  PROT 8.3* 7.3  ALBUMIN 3.0* 2.6*   Recent Labs  Lab  10/22/22 1603  LIPASE 23   No results for input(s): "AMMONIA" in the last 168 hours. Coagulation Profile: No results for input(s): "INR", "PROTIME" in the last 168 hours. Cardiac Enzymes: No results for input(s): "CKTOTAL", "CKMB", "CKMBINDEX", "TROPONINI" in the last 168 hours. BNP (last 3 results) No results for input(s): "PROBNP" in the last 8760 hours. HbA1C: No results for input(s): "HGBA1C" in the last 72 hours. CBG: Recent Labs  Lab 10/23/22 2036 10/23/22 2330 10/24/22 0409 10/24/22 0818 10/24/22 1132  GLUCAP 71 103* 120* 120* 141*   Lipid Profile: No results for input(s): "CHOL", "HDL", "LDLCALC", "TRIG", "CHOLHDL", "LDLDIRECT" in the last 72 hours. Thyroid Function Tests: No results for input(s): "TSH", "T4TOTAL", "FREET4", "T3FREE", "THYROIDAB" in the last 72 hours. Anemia Panel: No results for input(s): "VITAMINB12", "FOLATE", "FERRITIN", "TIBC", "IRON", "RETICCTPCT" in the last 72 hours. Sepsis Labs: Recent Labs  Lab 10/22/22 1526  PROCALCITON 0.12  LATICACIDVEN 1.0    Recent Results (from the past 240 hour(s))  Aerobic culture     Status: Abnormal   Collection Time: 10/16/22 10:50 AM  Result Value Ref Range Status   Aerobic Bacterial Culture Final report (A)  Final   Organism ID, Bacteria Serratia marcescens (A)  Final    Comment: Heavy growth   Antimicrobial Susceptibility Comment  Final    Comment:       ** S = Susceptible;  I = Intermediate; R = Resistant **                    P = Positive; N = Negative             MICS are expressed in micrograms per mL    Antibiotic                 RSLT#1    RSLT#2    RSLT#3    RSLT#4 Amoxicillin/Clavulanic Acid    R Cefazolin                      R Cefepime                       S Ceftriaxone                    S Cefuroxime                     R Ciprofloxacin                  S Ertapenem                      S Gentamicin                     S Levofloxacin                   S Meropenem                       S Tetracycline                   R Tobramycin                     S Trimethoprim/Sulfa             S   Resp panel by RT-PCR (RSV, Flu A&B, Covid) Anterior Nasal Swab     Status: None   Collection Time: 10/22/22  4:03 PM   Specimen: Anterior Nasal Swab  Result Value Ref Range Status   SARS Coronavirus 2 by RT PCR NEGATIVE NEGATIVE Final    Comment: (NOTE) SARS-CoV-2 target nucleic acids are NOT DETECTED.  The SARS-CoV-2 RNA is generally detectable in upper respiratory specimens during the acute phase of infection. The lowest concentration of SARS-CoV-2 viral copies this assay can detect is 138 copies/mL. A negative result does not preclude SARS-Cov-2 infection and should not be used as the sole basis for treatment or other patient management decisions. A negative result may occur with  improper specimen collection/handling, submission of specimen other than nasopharyngeal swab, presence of viral mutation(s) within the areas targeted by this assay, and inadequate number of viral copies(<138 copies/mL). A negative result must be combined with clinical observations, patient history, and epidemiological information. The expected result is Negative.  Fact Sheet for Patients:  BloggerCourse.com  Fact Sheet for Healthcare Providers:  SeriousBroker.it  This test is no t yet approved or cleared by the [place] FDA and  has been authorized for detection and/or diagnosis of SARS-CoV-2 by FDA under an Emergency Use Authorization (EUA). This EUA will remain  in effect (meaning this test can be used) for the duration of the COVID-19 declaration under Section 564(b)(1) of the Act, 21 U.S.C.section 360bbb-3(b)(1), unless the authorization is terminated  or  revoked sooner.       Influenza A by PCR NEGATIVE NEGATIVE Final   Influenza B by PCR NEGATIVE NEGATIVE Final    Comment: (NOTE) The Xpert Xpress SARS-CoV-2/FLU/RSV plus assay is  intended as an aid in the diagnosis of influenza from Nasopharyngeal swab specimens and should not be used as a sole basis for treatment. Nasal washings and aspirates are unacceptable for Xpert Xpress SARS-CoV-2/FLU/RSV testing.  Fact Sheet for Patients: BloggerCourse.com  Fact Sheet for Healthcare Providers: SeriousBroker.it  This test is not yet approved or cleared by the [place] FDA and has been authorized for detection and/or diagnosis of SARS-CoV-2 by FDA under an Emergency Use Authorization (EUA). This EUA will remain in effect (meaning this test can be used) for the duration of the COVID-19 declaration under Section 564(b)(1) of the Act, 21 U.S.C. section 360bbb-3(b)(1), unless the authorization is terminated or revoked.     Resp Syncytial Virus by PCR NEGATIVE NEGATIVE Final    Comment: (NOTE) Fact Sheet for Patients: BloggerCourse.com  Fact Sheet for Healthcare Providers: SeriousBroker.it  This test is not yet approved or cleared by the [place] FDA and has been authorized for detection and/or diagnosis of SARS-CoV-2 by FDA under an Emergency Use Authorization (EUA). This EUA will remain in effect (meaning this test can be used) for the duration of the COVID-19 declaration under Section 564(b)(1) of the Act, 21 U.S.C. section 360bbb-3(b)(1), unless the authorization is terminated or revoked.  Performed at [facility] [address] [place], [place] [address]   Body fluid culture w Gram Stain     Status: None (Preliminary result)   Collection Time: 10/23/22  2:20 PM   Specimen: PATH Cytology Peritoneal fluid  Result Value Ref Range Status   Specimen Description   Final    PERITONEAL Performed at [facility], [address], [place], [place] [address]    Special Requests   Final    PERITONEAL Performed at [facility],  [address] [place], [place] [address]    Gram Stain   Final    WBC PRESENT,BOTH PMN AND MONONUCLEAR NO ORGANISMS SEEN CYTOSPIN SMEAR    Culture   Final    NO GROWTH < 24 HOURS Performed at [facility], [address] [place], [place] [address]    Report Status PENDING  Incomplete         Radiology Studies: US Paracentesis  Result Date: 10/24/2022 INDICATION: Patient presents today with possible malignant ascites. Diagnostic and therapeutic paracentesis. EXAM: ULTRASOUND GUIDED PARACENTESIS MEDICATIONS: 1% lidocaine 10 mL COMPLICATIONS: None immediate. PROCEDURE: Informed written consent was obtained from the patient after a discussion of the risks, benefits and alternatives to treatment. A timeout was performed prior to the initiation of the procedure. Initial ultrasound scanning demonstrates a large amount of ascites within the left lower abdominal quadrant. The left lower abdomen was prepped and draped in the usual sterile fashion. 1% lidocaine was used for local anesthesia. Following this, a 19 gauge, 10-cm, Yueh catheter was introduced. An ultrasound image was saved for documentation purposes. The paracentesis was performed. The catheter was removed and a dressing was applied. The patient tolerated the procedure well without immediate post procedural complication. Patient received post-procedure intravenous albumin; see nursing notes for details. FINDINGS: A total of approximately 3.9 L of clear yellow fluid was removed. IMPRESSION: Successful ultrasound-guided paracentesis yielding 3.9 liters of peritoneal fluid. Procedure performed by Alwyn Ren NP Electronically Signed   By: Olive Bass  M.D.   On: 10/24/2022 08:12   CT ABDOMEN PELVIS W CONTRAST  Result Date: 10/22/2022 CLINICAL DATA:  Abdominal pain, most pronounced in the left lower quadrant and suprapubic regions. Vomiting, fever. EXAM: CT ABDOMEN AND PELVIS WITH CONTRAST TECHNIQUE: Multidetector CT imaging  of the abdomen and pelvis was performed using the standard protocol following bolus administration of intravenous contrast. RADIATION DOSE REDUCTION: This exam was performed according to the departmental dose-optimization program which includes automated exposure control, adjustment of the mA and/or kV according to patient size and/or use of iterative reconstruction technique. CONTRAST:  OMNIPAQUE IOHEXOL 300 MG/ML  SOLN COMPARISON:  12/12/2004 FINDINGS: Lower chest: 6 no acute abnormality Hepatobiliary: No focal liver abnormality is seen. Status post cholecystectomy. No biliary dilatation. Pancreas: No focal abnormality or ductal dilatation. Spleen: No focal abnormality.  Normal size. Adrenals/Urinary Tract: No adrenal abnormality. No focal renal abnormality. No stones or hydronephrosis. Urinary bladder is unremarkable. Stomach/Bowel: Stomach, large and small bowel grossly unremarkable. Vascular/Lymphatic: Aortic atherosclerosis. No evidence of aneurysm or adenopathy. Reproductive: Uterus and adnexa unremarkable.  No mass. Other: Large volume ascites in the abdomen and pelvis. Omental thickening/caking noted anteriorly. Appearance is concerning for peritoneal carcinomatosis, often seen with ovarian cancer. However, no ovarian mass visualized. Musculoskeletal: No acute bony abnormality. IMPRESSION: Large volume ascites in the abdomen or pelvis with omental caking. Appearance is concerning for peritoneal malignancy/carcinomatosis, often seen with ovarian cancer, but no ovarian mass visualized. Aortic atherosclerosis. Electronically Signed   By: Charlett Nose M.D.   On: 10/22/2022 19:41        Scheduled Meds:  acidophilus  1 capsule Oral Daily   aspirin EC  81 mg Oral Daily   carvedilol  6.25 mg Oral BID   insulin aspart  0-20 Units Subcutaneous Q4H   iron polysaccharides  150 mg Oral Daily   montelukast  10 mg Oral QHS   multivitamin with minerals  1 tablet Oral Daily   pantoprazole (PROTONIX) IV   40 mg Intravenous Q24H   Ensure Max Protein  11 oz Oral TID   venlafaxine XR  150 mg Oral QHS   zinc sulfate  220 mg Oral Daily   Continuous Infusions:  promethazine (PHENERGAN) injection (IM or IVPB)       LOS: 2 days      Tresa Moore, MD [facility]   If 7PM-7AM, please contact night-coverage  10/24/2022, 12:01 PM

## 2022-10-24 NOTE — Progress Notes (Signed)
Nutrition Follow Up Note   DOCUMENTATION CODES:   Obesity unspecified  INTERVENTION:   Recommend post pyloric NGT placement and nutrition support if patient remains unable to keep food down over the next 48 hrs  Ensure Max protein supplement po TID, each supplement provides 150kcal and 30g of protein.  MVI po daily   Pt at high refeed risk; recommend monitor potassium, magnesium and phosphorus labs daily until stable  Daily weights   NUTRITION DIAGNOSIS:   Inadequate oral intake related to acute illness as evidenced by per patient/family report.  GOAL:   Patient will meet greater than or equal to 90% of their needs -not met   MONITOR:   PO intake, Supplement acceptance, Labs, Weight trends, I & O's, Skin   ASSESSMENT:   57 y/o female with h/o HTN, asthma, HLD, cardiomyopathy, DM, CHF, depression, CAD, GERD and recent admission for MSSA bacteremia due to osteomyelitis L foot s/p L BKA 09/24/22 and who is now admitted with large volume ascites and omental caking concerning for peritoneal carcinomatosis and AKI.  -Pt s/p thoracentesis 10/16 with 3.9L output   Met with pt and family in room today. Pt reports continued nausea and vomiting. Pt reports that she ate a few bites of biscuit this morning and then threw it up. Pt was able to keep down an Ensure at the time of RD visit. Pt has now been without adequate nutrition for 7 days. RD discussed with pt the importance of adequate nutrition needed to preserve lean muscle. Discussed with pt and family the recommendation for post pyloric NGT and nutrition support if nausea and vomiting does not resolve over the next day or two. Family seems agreeable to tube if needed. Pt is at high refeed risk. Pt reports worsening abdominal distension and early satiety today. Cytologies from paracentesis still pending. Per chart, pt is down 10lbs(3%) since her last admission.   Medications reviewed and include: risaquad, aspirin, insulin, iron, MVI,  protonix, zinc  Labs reviewed: Na 132(L), K 4.8 wnl, BUN 48(H), creat 1.13(H)- 10/16 Hgb 9.2(L), Hct 29.1(L)- 10/16 Cbgs- 141, 120, 120 x 24 hrs  NUTRITION - FOCUSED PHYSICAL EXAM:  Flowsheet Row Most Recent Value  Orbital Region No depletion  Upper Arm Region No depletion  Thoracic and Lumbar Region No depletion  Buccal Region No depletion  Temple Region No depletion  Clavicle Bone Region No depletion  Clavicle and Acromion Bone Region No depletion  Scapular Bone Region No depletion  Dorsal Hand No depletion  Patellar Region No depletion  Anterior Thigh Region No depletion  Posterior Calf Region No depletion  Edema (RD Assessment) None  Hair Reviewed  Eyes Reviewed  Mouth Reviewed  Skin Reviewed  Nails Reviewed   Diet Order:   Diet Order             Diet Carb Modified Fluid consistency: Thin  Diet effective now                  EDUCATION NEEDS:   Not appropriate for education at this time  Skin:  Skin Assessment: Reviewed RN Assessment  Last BM:  10/15  Height:   Ht Readings from Last 1 Encounters:  10/22/22 5\' 9"  (1.753 m)    Weight:   Wt Readings from Last 1 Encounters:  10/24/22 117.8 kg   BMI:  Body mass index is 38.35 kg/m.  Estimated Nutritional Needs:   Kcal:  2400-2700kcal/day  Protein:  >120g/day  Fluid:  2.0-2.3L/day  Betsey Holiday MS, RD,  LDN Please refer to [facility] for RD and/or RD on-call/weekend/after hours pager

## 2022-10-24 NOTE — Plan of Care (Signed)
°  Problem: Fluid Volume: °Goal: Hemodynamic stability will improve °Outcome: Progressing °  °Problem: Clinical Measurements: °Goal: Diagnostic test results will improve °Outcome: Progressing °Goal: Signs and symptoms of infection will decrease °Outcome: Progressing °  °

## 2022-10-25 DIAGNOSIS — R188 Other ascites: Secondary | ICD-10-CM | POA: Diagnosis not present

## 2022-10-25 LAB — GLUCOSE, CAPILLARY
Glucose-Capillary: 122 mg/dL — ABNORMAL HIGH (ref 70–99)
Glucose-Capillary: 125 mg/dL — ABNORMAL HIGH (ref 70–99)
Glucose-Capillary: 182 mg/dL — ABNORMAL HIGH (ref 70–99)
Glucose-Capillary: 140 mg/dL — ABNORMAL HIGH (ref 70–99)
Glucose-Capillary: 152 mg/dL — ABNORMAL HIGH (ref 70–99)

## 2022-10-25 LAB — LIPASE, FLUID: Lipase-Fluid: 8 U/L

## 2022-10-25 LAB — CYTOLOGY - NON PAP

## 2022-10-25 MED ORDER — SENNOSIDES-DOCUSATE SODIUM 8.6-50 MG PO TABS
1.0000 | ORAL_TABLET | Freq: Two times a day (BID) | ORAL | Status: DC
  Administered 2022-10-25 – 2022-10-30 (×10): 1 via ORAL
  Filled 2022-10-25 (×10): qty 1

## 2022-10-25 MED ORDER — POLYETHYLENE GLYCOL 3350 17 G PO PACK
17.0000 g | PACK | Freq: Every day | ORAL | Status: DC
  Administered 2022-10-25 – 2022-10-30 (×6): 17 g via ORAL
  Filled 2022-10-25 (×6): qty 1

## 2022-10-25 NOTE — Plan of Care (Signed)
  Problem: Fluid Volume: Goal: Hemodynamic stability will improve Outcome: Progressing   Problem: Clinical Measurements: Goal: Diagnostic test results will improve Outcome: Progressing Goal: Signs and symptoms of infection will decrease Outcome: Progressing   Problem: Respiratory: Goal: Ability to maintain adequate ventilation will improve Outcome: Progressing   Problem: Education: Goal: Ability to describe self-care measures that may prevent or decrease complications (Diabetes Survival Skills Education) will improve Outcome: Progressing Goal: Individualized Educational Video(s) Outcome: Progressing   Problem: Coping: Goal: Ability to adjust to condition or change in health will improve Outcome: Progressing   Problem: Fluid Volume: Goal: Ability to maintain a balanced intake and output will improve Outcome: Progressing   Problem: Health Behavior/Discharge Planning: Goal: Ability to identify and utilize available resources and services will improve Outcome: Progressing Goal: Ability to manage health-related needs will improve Outcome: Progressing   Problem: Metabolic: Goal: Ability to maintain appropriate glucose levels will improve Outcome: Progressing   Problem: Nutritional: Goal: Maintenance of adequate nutrition will improve Outcome: Progressing Goal: Progress toward achieving an optimal weight will improve Outcome: Progressing   Problem: Skin Integrity: Goal: Risk for impaired skin integrity will decrease Outcome: Progressing   Problem: Tissue Perfusion: Goal: Adequacy of tissue perfusion will improve Outcome: Progressing   Problem: Education: Goal: Knowledge of General Education information will improve Description: Including pain rating scale, medication(s)/side effects and non-pharmacologic comfort measures Outcome: Progressing   Problem: Health Behavior/Discharge Planning: Goal: Ability to manage health-related needs will improve Outcome:  Progressing   Problem: Clinical Measurements: Goal: Ability to maintain clinical measurements within normal limits will improve Outcome: Progressing Goal: Will remain free from infection Outcome: Progressing Goal: Diagnostic test results will improve Outcome: Progressing Goal: Respiratory complications will improve Outcome: Progressing Goal: Cardiovascular complication will be avoided Outcome: Progressing   Problem: Activity: Goal: Risk for activity intolerance will decrease Outcome: Progressing   Problem: Nutrition: Goal: Adequate nutrition will be maintained Outcome: Progressing   Problem: Coping: Goal: Level of anxiety will decrease Outcome: Progressing   Problem: Elimination: Goal: Will not experience complications related to bowel motility Outcome: Progressing Goal: Will not experience complications related to urinary retention Outcome: Progressing   Problem: Pain Managment: Goal: General experience of comfort will improve Outcome: Progressing   Problem: Safety: Goal: Ability to remain free from injury will improve Outcome: Progressing   Problem: Skin Integrity: Goal: Risk for impaired skin integrity will decrease Outcome: Progressing   Problem: Education: Goal: Understanding of post-operative needs will improve Outcome: Progressing Goal: Individualized Educational Video(s) Outcome: Progressing   Problem: Clinical Measurements: Goal: Postoperative complications will be avoided or minimized Outcome: Progressing   Problem: Respiratory: Goal: Will regain and/or maintain adequate ventilation Outcome: Progressing

## 2022-10-25 NOTE — Consult Note (Signed)
[facility]  [facility] Inpatient Consult   10/25/2022  Nancy [name] [birth year] [id number]  [facility] [[facility]]  Accountable Care Organization [ACO] Patient: [name] [facility]  [facility] Liaison remote coverage review for patient admitted to [facility] coverage for [name]. 1625 Call placed to patient's bedside phone via assistance of nursing staff.  HIPAA verified.  Explained reason for call and for follow up to post hospital needs.  Patient states she used to have home health and not sure if they will be coming back out.  She endorses her PCP and follow up without problems expected.  Primary Care Provider: Miki Kins, FNP with [facility]  this provider is listed for the transition of care follow up appointments and calls   The patient was screened for 30 day readmission hospitalization with noted medium high risk score for unplanned readmission risk hospital admissions in 6 months.  The patient was assessed for potential [facility]) Care Management service needs for post hospital transition for care coordination. Review of patient's electronic medical record reveals patient is currently for home with [facility]. Noted with [facility].   Plan: [facility] Liaison will continue to follow progress and disposition to asess for post hospital community care coordination/management needs.  Referral request for community care coordination: pending disposition and anticipate post TOC follow up calls.   Community Care Management/Population Health does not replace or interfere with any arrangements made by the Inpatient Transition of Care team.   For questions contact:   Charlesetta Shanks, RN, BSN, CCM [place]  [facility], [facility] Liaison Direct Dial: [phone] or secure chat Website: [email or web address]

## 2022-10-25 NOTE — Plan of Care (Signed)
[facility] Tonsillectomy/Adenoidectomy, Postoperative PEDS care plan entered in error.

## 2022-10-25 NOTE — Progress Notes (Signed)
PROGRESS NOTE    Nancy [name]  GEX:[id number] DOB: January 09, 1965 DOA: 10/22/2022 PCP: Miki Kins, FNP    Brief Narrative:  57 y.o. female who presented with abdominal pain with CT showing large volume ascites and omental caking concerning for peritoneal carcinomatosis.  Patient has past medical history significant for type 2 diabetes, CAD, asthma, chronic pain, diabetic foot infection, GERD, hyperlipidemia, Takotsubo cardiomyopathy hospitalized from 9/11 to 09/30/2022 with sepsis secondary to diabetic foot infection/osteomyelitis s/p left BKA 9/17, also found to have Staph aureus bacteremia with TEE negative for vegetations, discharged home with 2 weeks of IV cefazolin, completed a few days ago, who presents to the ED with a 6-day history of nonbloody nonbilious vomiting Associated with generalized abdominal pain and distention.  States during her recent past hospitalization she initially presented with bleeding from her rectum but it spontaneously resolved.  She had abdominal pain and nausea but she attributed it to her foot infection.  She denies a history of unintentional weight loss or poor appetite except for when her vomiting started a week ago.   She denies cough, chest pain, shortness of breath, leg pain, black or bloody stool except for one-time episode prior to her last hospitalization and denies, dysuria.   Status post ultrasound-guided paracentesis on 10/16.  3.9 L yellow fluid removed  Assessment & Plan:   Principal Problem:   Ascites, possibly malignant Active Problems:   Intractable vomiting   Hypotension   Essential hypertension   AKI (acute kidney injury) (HCC)   History of MSSA bacteremia due to osteomyelitis L foot 09/18/22   S/P left  BKA 09/24/22 secondary to osteomyelitis (below knee amputation) (HCC)   Diarrhea   Asthma without status asthmaticus   Clinical depression   Takotsubo cardiomyopathy   Diabetes mellitus, type 2 (HCC)   Obesity, Class III, BMI  40-49.9 (morbid obesity) (HCC)   Ascites, possibly malignant Peritoneal caking on CT/ ?Carcinomatosis Intractable vomiting SIRS SIRS criteria include leukocytosis and hypotension with temp of 100.6, suspect related to vomiting versus infection CT showing large volume ascites with omental caking concerning for peritoneal malignancy-carcinomatosis but no mass Status post paracentesis with 3.9 L fluid removed on 10/16 No evidence of SBP Ascitic fluid culture remains negative Plan: No indication for antibiotics Symptom management with narcotics and antiemetics Follow cytology from paracentesis, pending  Hypotension Essential hypertension Secondary to volume depletion from intractable vomiting Continue holding BP meds   AKI (acute kidney injury) (HCC) Prerenal/ATN from volume depletion related to intractable vomiting Creatinine 1.19 up from recent baseline of 0.59 Creatinine improving.    Diarrhea Resolved low suspicion for infectious diarrhea.  Infectious workup discontinued   History of MSSA bacteremia due to osteomyelitis L foot 09/18/22 S/p left BKA 9/17 secondary to osteomyelitis Patient completed cefazolin a couple weeks prior No acute issues suspected TEE on 9/17 was negative for vegetations   Obesity, Class III, BMI 40-49.9 (morbid obesity) (HCC) Complicating factor to overall prognosis and care   Diabetes mellitus, type 2 (HCC) Sliding scale insulin coverage   Clinical depression Continue home venlafaxine and alprazolam   Asthma without status asthmaticus Not acutely exacerbated Continue Singulair Albuterol as needed    DVT prophylaxis: SCD Code Status: Full Family Communication: Spouse and mother at bedside 10/16, 10/17 Disposition Plan: Status is: Inpatient Remains inpatient appropriate because: Ascites of unclear etiology.  Workup in progress   Level of care: Telemetry Medical  Consultants:  Interventional radiology  Procedures:  Ultrasound-guided  paracentesis  Antimicrobials:   Subjective: Seen  and examined.  Resting in bed.  Appears more comfortable than yesterday.  Objective: Vitals:   10/24/22 1923 10/25/22 0229 10/25/22 0500 10/25/22 0812  BP: (!) 147/69 (!) 169/72  123/76  Pulse: 78 77  78  Resp: 18 18  16   Temp: 98.7 F (37.1 C) 97.9 F (36.6 C)  98.3 F (36.8 C)  TempSrc: Oral Oral  Oral  SpO2: 95% 96%  96%  Weight:   119.8 kg   Height:        Intake/Output Summary (Last 24 hours) at 10/25/2022 1135 Last data filed at 10/24/2022 1900 Gross per 24 hour  Intake 120 ml  Output --  Net 120 ml   Filed Weights   10/22/22 1451 10/24/22 0411 10/25/22 0500  Weight: 122 kg 117.8 kg 119.8 kg    Examination:  General exam: NAD Respiratory system: Clear to auscultation. Respiratory effort normal. Cardiovascular system: S1-S2, RRR, no murmurs, no pedal edema Gastrointestinal system: Obese, soft, mild distention Central nervous system: Alert and oriented. No focal neurological deficits. Extremities: Left lower extremity BKA Skin: No rashes, lesions or ulcers Psychiatry: Judgement and insight appear normal. Mood & affect appropriate.     Data Reviewed: I have personally reviewed following labs and imaging studies  CBC: Recent Labs  Lab 10/22/22 1526 10/23/22 0510  WBC 11.7* 9.7  NEUTROABS 8.7*  --   HGB 9.8* 9.2*  HCT 31.4* 29.1*  MCV 91.3 90.4  PLT 552* 537*   Basic Metabolic Panel: Recent Labs  Lab 10/22/22 1526 10/23/22 0510  NA 133* 132*  K 5.1 4.8  CL 97* 99  CO2 24 24  GLUCOSE 125* 127*  BUN 54* 48*  CREATININE 1.19* 1.13*  CALCIUM 8.8* 8.3*   GFR: Estimated Creatinine Clearance: 76 mL/min (A) (by C-G formula based on SCr of 1.13 mg/dL (H)). Liver Function Tests: Recent Labs  Lab 10/22/22 1526 10/23/22 0510  AST 14* 13*  ALT 7 7  ALKPHOS 56 53  BILITOT 0.6 0.5  PROT 8.3* 7.3  ALBUMIN 3.0* 2.6*   Recent Labs  Lab 10/22/22 1603  LIPASE 23   No results for input(s):  "AMMONIA" in the last 168 hours. Coagulation Profile: No results for input(s): "INR", "PROTIME" in the last 168 hours. Cardiac Enzymes: No results for input(s): "CKTOTAL", "CKMB", "CKMBINDEX", "TROPONINI" in the last 168 hours. BNP (last 3 results) No results for input(s): "PROBNP" in the last 8760 hours. HbA1C: No results for input(s): "HGBA1C" in the last 72 hours. CBG: Recent Labs  Lab 10/24/22 1528 10/24/22 1921 10/24/22 2314 10/25/22 0333 10/25/22 0814  GLUCAP 137* 134* 133* 122* 125*   Lipid Profile: No results for input(s): "CHOL", "HDL", "LDLCALC", "TRIG", "CHOLHDL", "LDLDIRECT" in the last 72 hours. Thyroid Function Tests: No results for input(s): "TSH", "T4TOTAL", "FREET4", "T3FREE", "THYROIDAB" in the last 72 hours. Anemia Panel: No results for input(s): "VITAMINB12", "FOLATE", "FERRITIN", "TIBC", "IRON", "RETICCTPCT" in the last 72 hours. Sepsis Labs: Recent Labs  Lab 10/22/22 1526  PROCALCITON 0.12  LATICACIDVEN 1.0    Recent Results (from the past 240 hour(s))  Aerobic culture     Status: Abnormal   Collection Time: 10/16/22 10:50 AM  Result Value Ref Range Status   Aerobic Bacterial Culture Final report (A)  Final   Organism ID, Bacteria Serratia marcescens (A)  Final    Comment: Heavy growth   Antimicrobial Susceptibility Comment  Final    Comment:       ** S = Susceptible; I = Intermediate; R = Resistant **  P = Positive; N = Negative             MICS are expressed in micrograms per mL    Antibiotic                 RSLT#1    RSLT#2    RSLT#3    RSLT#4 Amoxicillin/Clavulanic Acid    R Cefazolin                      R Cefepime                       S Ceftriaxone                    S Cefuroxime                     R Ciprofloxacin                  S Ertapenem                      S Gentamicin                     S Levofloxacin                   S Meropenem                      S Tetracycline                   R Tobramycin                      S Trimethoprim/Sulfa             S   Resp panel by RT-PCR (RSV, Flu A&B, Covid) Anterior Nasal Swab     Status: None   Collection Time: 10/22/22  4:03 PM   Specimen: Anterior Nasal Swab  Result Value Ref Range Status   SARS Coronavirus 2 by RT PCR NEGATIVE NEGATIVE Final    Comment: (NOTE) SARS-CoV-2 target nucleic acids are NOT DETECTED.  The SARS-CoV-2 RNA is generally detectable in upper respiratory specimens during the acute phase of infection. The lowest concentration of SARS-CoV-2 viral copies this assay can detect is 138 copies/mL. A negative result does not preclude SARS-Cov-2 infection and should not be used as the sole basis for treatment or other patient management decisions. A negative result may occur with  improper specimen collection/handling, submission of specimen other than nasopharyngeal swab, presence of viral mutation(s) within the areas targeted by this assay, and inadequate number of viral copies(<138 copies/mL). A negative result must be combined with clinical observations, patient history, and epidemiological information. The expected result is Negative.  Fact Sheet for Patients:  BloggerCourse.com  Fact Sheet for Healthcare Providers:  SeriousBroker.it  This test is no t yet approved or cleared by the [place] FDA and  has been authorized for detection and/or diagnosis of SARS-CoV-2 by FDA under an Emergency Use Authorization (EUA). This EUA will remain  in effect (meaning this test can be used) for the duration of the COVID-19 declaration under Section 564(b)(1) of the Act, 21 U.S.C.section 360bbb-3(b)(1), unless the authorization is terminated  or revoked sooner.       Influenza A by PCR NEGATIVE NEGATIVE Final   Influenza B by PCR NEGATIVE NEGATIVE Final  Comment: (NOTE) The Xpert Xpress SARS-CoV-2/FLU/RSV plus assay is intended as an aid in the diagnosis of influenza from  Nasopharyngeal swab specimens and should not be used as a sole basis for treatment. Nasal washings and aspirates are unacceptable for Xpert Xpress SARS-CoV-2/FLU/RSV testing.  Fact Sheet for Patients: BloggerCourse.com  Fact Sheet for Healthcare Providers: SeriousBroker.it  This test is not yet approved or cleared by the [place] FDA and has been authorized for detection and/or diagnosis of SARS-CoV-2 by FDA under an Emergency Use Authorization (EUA). This EUA will remain in effect (meaning this test can be used) for the duration of the COVID-19 declaration under Section 564(b)(1) of the Act, 21 U.S.C. section 360bbb-3(b)(1), unless the authorization is terminated or revoked.     Resp Syncytial Virus by PCR NEGATIVE NEGATIVE Final    Comment: (NOTE) Fact Sheet for Patients: BloggerCourse.com  Fact Sheet for Healthcare Providers: SeriousBroker.it  This test is not yet approved or cleared by the [place] FDA and has been authorized for detection and/or diagnosis of SARS-CoV-2 by FDA under an Emergency Use Authorization (EUA). This EUA will remain in effect (meaning this test can be used) for the duration of the COVID-19 declaration under Section 564(b)(1) of the Act, 21 U.S.C. section 360bbb-3(b)(1), unless the authorization is terminated or revoked.  Performed at [facility], [address] [place], [place] [address]   Body fluid culture w Gram Stain     Status: None (Preliminary result)   Collection Time: 10/23/22  2:20 PM   Specimen: PATH Cytology Peritoneal fluid  Result Value Ref Range Status   Specimen Description   Final    PERITONEAL Performed at [facility], [address], [place], [place] [address]    Special Requests   Final    PERITONEAL Performed at [facility], [address] [place], [place] [address]    Gram  Stain   Final    WBC PRESENT,BOTH PMN AND MONONUCLEAR NO ORGANISMS SEEN CYTOSPIN SMEAR    Culture   Final    NO GROWTH 2 DAYS Performed at [facility], [address], [place], [place] [address]    Report Status PENDING  Incomplete         Radiology Studies: US Paracentesis  Result Date: 10/24/2022 INDICATION: Patient presents today with possible malignant ascites. Diagnostic and therapeutic paracentesis. EXAM: ULTRASOUND GUIDED PARACENTESIS MEDICATIONS: 1% lidocaine 10 mL COMPLICATIONS: None immediate. PROCEDURE: Informed written consent was obtained from the patient after a discussion of the risks, benefits and alternatives to treatment. A timeout was performed prior to the initiation of the procedure. Initial ultrasound scanning demonstrates a large amount of ascites within the left lower abdominal quadrant. The left lower abdomen was prepped and draped in the usual sterile fashion. 1% lidocaine was used for local anesthesia. Following this, a 19 gauge, 10-cm, Yueh catheter was introduced. An ultrasound image was saved for documentation purposes. The paracentesis was performed. The catheter was removed and a dressing was applied. The patient tolerated the procedure well without immediate post procedural complication. Patient received post-procedure intravenous albumin; see nursing notes for details. FINDINGS: A total of approximately 3.9 L of clear yellow fluid was removed. IMPRESSION: Successful ultrasound-guided paracentesis yielding 3.9 liters of peritoneal fluid. Procedure performed by Alwyn Ren NP Electronically Signed   By: Olive Bass M.D.   On: 10/24/2022 08:12        Scheduled Meds:  acidophilus  1 capsule Oral Daily   aspirin EC  81  mg Oral Daily   carvedilol  6.25 mg Oral BID   insulin aspart  0-20 Units Subcutaneous Q4H   iron polysaccharides  150 mg Oral Daily   montelukast  10 mg Oral QHS   multivitamin with minerals  1 tablet Oral Daily    pantoprazole (PROTONIX) IV  40 mg Intravenous Q24H   Ensure Max Protein  11 oz Oral TID   venlafaxine XR  150 mg Oral QHS   zinc sulfate  220 mg Oral Daily   Continuous Infusions:  promethazine (PHENERGAN) injection (IM or IVPB)       LOS: 3 days      Tresa Moore, MD [facility]   If 7PM-7AM, please contact night-coverage  10/25/2022, 11:35 AM

## 2022-10-26 DIAGNOSIS — R188 Other ascites: Secondary | ICD-10-CM | POA: Diagnosis not present

## 2022-10-26 LAB — CBC WITH DIFFERENTIAL/PLATELET
Abs Immature Granulocytes: 0.05 10*3/uL (ref 0.00–0.07)
Basophils Absolute: 0.2 10*3/uL — ABNORMAL HIGH (ref 0.0–0.1)
Basophils Relative: 2 %
Eosinophils Absolute: 0.4 10*3/uL (ref 0.0–0.5)
Eosinophils Relative: 4 %
HCT: 32.5 % — ABNORMAL LOW (ref 36.0–46.0)
Hemoglobin: 10.2 g/dL — ABNORMAL LOW (ref 12.0–15.0)
Immature Granulocytes: 1 %
Lymphocytes Relative: 24 %
Lymphs Abs: 2.5 10*3/uL (ref 0.7–4.0)
MCH: 28.4 pg (ref 26.0–34.0)
MCHC: 31.4 g/dL (ref 30.0–36.0)
MCV: 90.5 fL (ref 80.0–100.0)
Monocytes Absolute: 0.8 10*3/uL (ref 0.1–1.0)
Monocytes Relative: 8 %
Neutro Abs: 6.3 10*3/uL (ref 1.7–7.7)
Neutrophils Relative %: 61 %
Platelets: 689 10*3/uL — ABNORMAL HIGH (ref 150–400)
RBC: 3.59 MIL/uL — ABNORMAL LOW (ref 3.87–5.11)
RDW: 13.9 % (ref 11.5–15.5)
WBC: 10.2 10*3/uL (ref 4.0–10.5)
nRBC: 0 % (ref 0.0–0.2)

## 2022-10-26 LAB — GLUCOSE, CAPILLARY
Glucose-Capillary: 116 mg/dL — ABNORMAL HIGH (ref 70–99)
Glucose-Capillary: 127 mg/dL — ABNORMAL HIGH (ref 70–99)
Glucose-Capillary: 142 mg/dL — ABNORMAL HIGH (ref 70–99)
Glucose-Capillary: 147 mg/dL — ABNORMAL HIGH (ref 70–99)
Glucose-Capillary: 153 mg/dL — ABNORMAL HIGH (ref 70–99)
Glucose-Capillary: 158 mg/dL — ABNORMAL HIGH (ref 70–99)
Glucose-Capillary: 167 mg/dL — ABNORMAL HIGH (ref 70–99)

## 2022-10-26 LAB — BASIC METABOLIC PANEL
Anion gap: 7 (ref 5–15)
BUN: 34 mg/dL — ABNORMAL HIGH (ref 6–20)
CO2: 27 mmol/L (ref 22–32)
Calcium: 8.9 mg/dL (ref 8.9–10.3)
Chloride: 100 mmol/L (ref 98–111)
Creatinine, Ser: 0.8 mg/dL (ref 0.44–1.00)
GFR, Estimated: >60 mL/min (ref >60)
Glucose, Bld: 150 mg/dL — ABNORMAL HIGH (ref 70–99)
Potassium: 5.5 mmol/L — ABNORMAL HIGH (ref 3.5–5.1)
Sodium: 134 mmol/L — ABNORMAL LOW (ref 135–145)

## 2022-10-26 MED ORDER — PANTOPRAZOLE SODIUM 40 MG PO TBEC
40.0000 mg | DELAYED_RELEASE_TABLET | Freq: Every day | ORAL | Status: DC
  Administered 2022-10-26 – 2022-10-30 (×5): 40 mg via ORAL
  Filled 2022-10-26 (×5): qty 1

## 2022-10-26 NOTE — Progress Notes (Signed)
PROGRESS NOTE    Nancy [name]  WUJ:[id number] DOB: [birth year] DOA: 10/22/2022 PCP: Miki Kins, FNP    Brief Narrative:  57 y.o. female who presented with abdominal pain with CT showing large volume ascites and omental caking concerning for peritoneal carcinomatosis.  Patient has past medical history significant for type 2 diabetes, CAD, asthma, chronic pain, diabetic foot infection, GERD, hyperlipidemia, Takotsubo cardiomyopathy hospitalized from 9/11 to 09/30/2022 with sepsis secondary to diabetic foot infection/osteomyelitis s/p left BKA 9/17, also found to have Staph aureus bacteremia with TEE negative for vegetations, discharged home with 2 weeks of IV cefazolin, completed a few days ago, who presents to the ED with a 6-day history of nonbloody nonbilious vomiting Associated with generalized abdominal pain and distention.  States during her recent past hospitalization she initially presented with bleeding from her rectum but it spontaneously resolved.  She had abdominal pain and nausea but she attributed it to her foot infection.  She denies a history of unintentional weight loss or poor appetite except for when her vomiting started a week ago.   She denies cough, chest pain, shortness of breath, leg pain, black or bloody stool except for one-time episode prior to her last hospitalization and denies, dysuria.   Status post ultrasound-guided paracentesis on 10/16.  3.9 L yellow fluid removed  Assessment & Plan:   Principal Problem:   Ascites, possibly malignant Active Problems:   Intractable vomiting   Hypotension   Essential hypertension   AKI (acute kidney injury) (HCC)   History of MSSA bacteremia due to osteomyelitis L foot 09/18/22   S/P left  BKA 09/24/22 secondary to osteomyelitis (below knee amputation) (HCC)   Diarrhea   Asthma without status asthmaticus   Clinical depression   Takotsubo cardiomyopathy   Diabetes mellitus, type 2 (HCC)   Obesity, Class III, BMI  40-49.9 (morbid obesity) (HCC)   Malignant ascites Peritoneal caking on CT/ ?Carcinomatosis Intractable vomiting SIRS SIRS criteria include leukocytosis and hypotension with temp of 100.6, suspect related to vomiting versus infection CT showing large volume ascites with omental caking concerning for peritoneal malignancy-carcinomatosis but no mass Status post paracentesis with 3.9 L fluid removed on 10/16 No evidence of SBP Ascitic fluid culture remains negative 10/19: Discussed with pathology.  Malignant cells noted on ascites fluid.  IHC pending. Plan: Symptom management Follow IHC from ascites fluid Consider repeat paracentesis on Monday 10/21 Possible oncology consultation on Monday 10/21  Hypotension Essential hypertension Secondary to volume depletion from intractable vomiting Continue holding BP meds   AKI (acute kidney injury) (HCC) Prerenal/ATN from volume depletion related to intractable vomiting Creatinine 1.19 up from recent baseline of 0.59 Creatinine improving.    Diarrhea Resolved low suspicion for infectious diarrhea.     History of MSSA bacteremia due to osteomyelitis L foot 09/18/22 S/p left BKA 9/17 secondary to osteomyelitis Patient completed cefazolin a couple weeks prior No acute issues suspected TEE on 9/17 was negative for vegetations   Obesity, Class III, BMI 40-49.9 (morbid obesity) (HCC) Complicating factor to overall prognosis and care   Diabetes mellitus, type 2 (HCC) Sliding scale insulin coverage   Clinical depression Continue home venlafaxine and alprazolam   Asthma without status asthmaticus Not acutely exacerbated Continue Singulair Albuterol as needed    DVT prophylaxis: SCD Code Status: Full Family Communication: Spouse and mother at bedside 10/16, 10/17 Disposition Plan: Status is: Inpatient Remains inpatient appropriate because: Malignant ascites.  Unknown primary.  Workup in progress   Level of care: Telemetry  Medical  Consultants:  Interventional radiology  Procedures:  Ultrasound-guided paracentesis  Antimicrobials:   Subjective: Seen and examined.  Resting in bed.  Continues to endorse abdominal pain  Objective: Vitals:   10/25/22 1615 10/25/22 2003 10/26/22 0303 10/26/22 0757  BP: (!) 128/53 126/68 (!) 142/77 120/70  Pulse: 73 70 69 66  Resp: 16 16 20 16   Temp: 99.1 F (37.3 C) 99 F (37.2 C) 98.8 F (37.1 C) 98.1 F (36.7 C)  TempSrc:   Oral Oral  SpO2: 96% 94% 98% 97%  Weight:      Height:       No intake or output data in the 24 hours ending 10/26/22 1234  Filed Weights   10/22/22 1451 10/24/22 0411 10/25/22 0500  Weight: 122 kg 117.8 kg 119.8 kg    Examination:  General exam: No acute distress Respiratory system: Clear to auscultation. Respiratory effort normal. Cardiovascular system: S1-S2, RRR, no murmurs, no pedal edema Gastrointestinal system:, Soft, mild distention Central nervous system: Alert and oriented. No focal neurological deficits. Extremities: Left lower extremity BKA Skin: No rashes, lesions or ulcers Psychiatry: Judgement and insight appear normal. Mood & affect appropriate.     Data Reviewed: I have personally reviewed following labs and imaging studies  CBC: Recent Labs  Lab 10/22/22 1526 10/23/22 0510 10/26/22 0807  WBC 11.7* 9.7 10.2  NEUTROABS 8.7*  --  6.3  HGB 9.8* 9.2* 10.2*  HCT 31.4* 29.1* 32.5*  MCV 91.3 90.4 90.5  PLT 552* 537* 689*   Basic Metabolic Panel: Recent Labs  Lab 10/22/22 1526 10/23/22 0510 10/26/22 0807  NA 133* 132* 134*  K 5.1 4.8 5.5*  CL 97* 99 100  CO2 24 24 27   GLUCOSE 125* 127* 150*  BUN 54* 48* 34*  CREATININE 1.19* 1.13* 0.80  CALCIUM 8.8* 8.3* 8.9   GFR: Estimated Creatinine Clearance: 107.3 mL/min (by C-G formula based on SCr of 0.8 mg/dL). Liver Function Tests: Recent Labs  Lab 10/22/22 1526 10/23/22 0510  AST 14* 13*  ALT 7 7  ALKPHOS 56 53  BILITOT 0.6 0.5  PROT 8.3* 7.3   ALBUMIN 3.0* 2.6*   Recent Labs  Lab 10/22/22 1603  LIPASE 23   No results for input(s): "AMMONIA" in the last 168 hours. Coagulation Profile: No results for input(s): "INR", "PROTIME" in the last 168 hours. Cardiac Enzymes: No results for input(s): "CKTOTAL", "CKMB", "CKMBINDEX", "TROPONINI" in the last 168 hours. BNP (last 3 results) No results for input(s): "PROBNP" in the last 8760 hours. HbA1C: No results for input(s): "HGBA1C" in the last 72 hours. CBG: Recent Labs  Lab 10/25/22 1615 10/25/22 2042 10/26/22 0034 10/26/22 0759 10/26/22 1201  GLUCAP 140* 152* 116* 127* 153*   Lipid Profile: No results for input(s): "CHOL", "HDL", "LDLCALC", "TRIG", "CHOLHDL", "LDLDIRECT" in the last 72 hours. Thyroid Function Tests: No results for input(s): "TSH", "T4TOTAL", "FREET4", "T3FREE", "THYROIDAB" in the last 72 hours. Anemia Panel: No results for input(s): "VITAMINB12", "FOLATE", "FERRITIN", "TIBC", "IRON", "RETICCTPCT" in the last 72 hours. Sepsis Labs: Recent Labs  Lab 10/22/22 1526  PROCALCITON 0.12  LATICACIDVEN 1.0    Recent Results (from the past 240 hour(s))  Resp panel by RT-PCR (RSV, Flu A&B, Covid) Anterior Nasal Swab     Status: None   Collection Time: 10/22/22  4:03 PM   Specimen: Anterior Nasal Swab  Result Value Ref Range Status   SARS Coronavirus 2 by RT PCR NEGATIVE NEGATIVE Final    Comment: (NOTE) SARS-CoV-2 target nucleic acids are NOT  DETECTED.  The SARS-CoV-2 RNA is generally detectable in upper respiratory specimens during the acute phase of infection. The lowest concentration of SARS-CoV-2 viral copies this assay can detect is 138 copies/mL. A negative result does not preclude SARS-Cov-2 infection and should not be used as the sole basis for treatment or other patient management decisions. A negative result may occur with  improper specimen collection/handling, submission of specimen other than nasopharyngeal swab, presence of viral  mutation(s) within the areas targeted by this assay, and inadequate number of viral copies(<138 copies/mL). A negative result must be combined with clinical observations, patient history, and epidemiological information. The expected result is Negative.  Fact Sheet for Patients:  BloggerCourse.com  Fact Sheet for Healthcare Providers:  SeriousBroker.it  This test is no t yet approved or cleared by the [place] FDA and  has been authorized for detection and/or diagnosis of SARS-CoV-2 by FDA under an Emergency Use Authorization (EUA). This EUA will remain  in effect (meaning this test can be used) for the duration of the COVID-19 declaration under Section 564(b)(1) of the Act, 21 U.S.C.section 360bbb-3(b)(1), unless the authorization is terminated  or revoked sooner.       Influenza A by PCR NEGATIVE NEGATIVE Final   Influenza B by PCR NEGATIVE NEGATIVE Final    Comment: (NOTE) The Xpert Xpress SARS-CoV-2/FLU/RSV plus assay is intended as an aid in the diagnosis of influenza from Nasopharyngeal swab specimens and should not be used as a sole basis for treatment. Nasal washings and aspirates are unacceptable for Xpert Xpress SARS-CoV-2/FLU/RSV testing.  Fact Sheet for Patients: BloggerCourse.com  Fact Sheet for Healthcare Providers: SeriousBroker.it  This test is not yet approved or cleared by the [place] FDA and has been authorized for detection and/or diagnosis of SARS-CoV-2 by FDA under an Emergency Use Authorization (EUA). This EUA will remain in effect (meaning this test can be used) for the duration of the COVID-19 declaration under Section 564(b)(1) of the Act, 21 U.S.C. section 360bbb-3(b)(1), unless the authorization is terminated or revoked.     Resp Syncytial Virus by PCR NEGATIVE NEGATIVE Final    Comment: (NOTE) Fact Sheet for  Patients: BloggerCourse.com  Fact Sheet for Healthcare Providers: SeriousBroker.it  This test is not yet approved or cleared by the [place] FDA and has been authorized for detection and/or diagnosis of SARS-CoV-2 by FDA under an Emergency Use Authorization (EUA). This EUA will remain in effect (meaning this test can be used) for the duration of the COVID-19 declaration under Section 564(b)(1) of the Act, 21 U.S.C. section 360bbb-3(b)(1), unless the authorization is terminated or revoked.  Performed at [facility], [address] [place], [place] [address]   Body fluid culture w Gram Stain     Status: None (Preliminary result)   Collection Time: 10/23/22  2:20 PM   Specimen: PATH Cytology Peritoneal fluid  Result Value Ref Range Status   Specimen Description   Final    PERITONEAL Performed at [facility], [address], [place], [place] [address]    Special Requests   Final    PERITONEAL Performed at [facility], [address] [place], [place] [address]    Gram Stain   Final    WBC PRESENT,BOTH PMN AND MONONUCLEAR NO ORGANISMS SEEN CYTOSPIN SMEAR    Culture   Final    NO GROWTH 2 DAYS Performed at [facility], [address] [place], [place] [address]    Report Status PENDING  Incomplete  Radiology Studies: No results found.      Scheduled Meds:  acidophilus  1 capsule Oral Daily   aspirin EC  81 mg Oral Daily   carvedilol  6.25 mg Oral BID   insulin aspart  0-20 Units Subcutaneous Q4H   iron polysaccharides  150 mg Oral Daily   montelukast  10 mg Oral QHS   multivitamin with minerals  1 tablet Oral Daily   pantoprazole  40 mg Oral Daily   polyethylene glycol  17 g Oral Daily   Ensure Max Protein  11 oz Oral TID   senna-docusate  1 tablet Oral BID   venlafaxine XR  150 mg Oral QHS   zinc sulfate  220 mg Oral Daily   Continuous Infusions:   promethazine (PHENERGAN) injection (IM or IVPB)       LOS: 4 days      Tresa Moore, MD [facility]   If 7PM-7AM, please contact night-coverage  10/26/2022, 12:34 PM

## 2022-10-26 NOTE — Plan of Care (Signed)

## 2022-10-27 DIAGNOSIS — R188 Other ascites: Secondary | ICD-10-CM | POA: Diagnosis not present

## 2022-10-27 LAB — BODY FLUID CULTURE W GRAM STAIN: Culture: NO GROWTH

## 2022-10-27 LAB — GLUCOSE, CAPILLARY
Glucose-Capillary: 131 mg/dL — ABNORMAL HIGH (ref 70–99)
Glucose-Capillary: 136 mg/dL — ABNORMAL HIGH (ref 70–99)
Glucose-Capillary: 139 mg/dL — ABNORMAL HIGH (ref 70–99)
Glucose-Capillary: 146 mg/dL — ABNORMAL HIGH (ref 70–99)
Glucose-Capillary: 178 mg/dL — ABNORMAL HIGH (ref 70–99)
Glucose-Capillary: 178 mg/dL — ABNORMAL HIGH (ref 70–99)

## 2022-10-27 MED ORDER — TRAMADOL HCL 50 MG PO TABS
50.0000 mg | ORAL_TABLET | Freq: Four times a day (QID) | ORAL | Status: DC | PRN
  Administered 2022-10-27: 50 mg via ORAL
  Filled 2022-10-27: qty 1

## 2022-10-27 MED ORDER — MAGNESIUM HYDROXIDE 400 MG/5ML PO SUSP: 30.0000 mL | Freq: Every day | ORAL | Status: DC | PRN

## 2022-10-27 MED ORDER — BISACODYL 10 MG RE SUPP: 10.0000 mg | Freq: Every day | RECTAL | Status: DC | PRN

## 2022-10-27 MED ORDER — AMLODIPINE BESYLATE 5 MG PO TABS
5.0000 mg | ORAL_TABLET | Freq: Every day | ORAL | Status: DC
  Administered 2022-10-27 – 2022-10-30 (×4): 5 mg via ORAL
  Filled 2022-10-27 (×4): qty 1

## 2022-10-27 MED ORDER — OXYCODONE HCL 5 MG PO TABS
5.0000 mg | ORAL_TABLET | ORAL | Status: DC | PRN
  Administered 2022-10-28: 10 mg via ORAL
  Administered 2022-10-28: 5 mg via ORAL
  Administered 2022-10-29 (×2): 10 mg via ORAL
  Filled 2022-10-27: qty 2
  Filled 2022-10-27: qty 1
  Filled 2022-10-27 (×3): qty 2

## 2022-10-27 NOTE — Progress Notes (Signed)
PROGRESS NOTE    Nancy [name]  ZOX:[id number] DOB: 1965-09-18 DOA: 10/22/2022 PCP: Miki Kins, FNP    Brief Narrative:  56 y.o. female who presented with abdominal pain with CT showing large volume ascites and omental caking concerning for peritoneal carcinomatosis.  Patient has past medical history significant for type 2 diabetes, CAD, asthma, chronic pain, diabetic foot infection, GERD, hyperlipidemia, Takotsubo cardiomyopathy hospitalized from 9/11 to 09/30/2022 with sepsis secondary to diabetic foot infection/osteomyelitis s/p left BKA 9/17, also found to have Staph aureus bacteremia with TEE negative for vegetations, discharged home with 2 weeks of IV cefazolin, completed a few days ago, who presents to the ED with a 6-day history of nonbloody nonbilious vomiting Associated with generalized abdominal pain and distention.  States during her recent past hospitalization she initially presented with bleeding from her rectum but it spontaneously resolved.  She had abdominal pain and nausea but she attributed it to her foot infection.  She denies a history of unintentional weight loss or poor appetite except for when her vomiting started a week ago.   She denies cough, chest pain, shortness of breath, leg pain, black or bloody stool except for one-time episode prior to her last hospitalization and denies, dysuria.   Status post ultrasound-guided paracentesis on 10/16.  3.9 L yellow fluid removed  Assessment & Plan:   Principal Problem:   Ascites, possibly malignant Active Problems:   Intractable vomiting   Hypotension   Essential hypertension   AKI (acute kidney injury) (HCC)   History of MSSA bacteremia due to osteomyelitis L foot 09/18/22   S/P left  BKA 09/24/22 secondary to osteomyelitis (below knee amputation) (HCC)   Diarrhea   Asthma without status asthmaticus   Clinical depression   Takotsubo cardiomyopathy   Diabetes mellitus, type 2 (HCC)   Obesity, Class III, BMI  40-49.9 (morbid obesity) (HCC)   Malignant ascites Peritoneal caking on CT/ ?Carcinomatosis Intractable vomiting SIRS SIRS criteria include leukocytosis and hypotension with temp of 100.6, suspect related to vomiting versus infection CT showing large volume ascites with omental caking concerning for peritoneal malignancy-carcinomatosis but no mass Status post paracentesis with 3.9 L fluid removed on 10/16 No evidence of SBP Ascitic fluid culture remains negative 10/19: Discussed with pathology.  Malignant cells noted on ascites fluid.  IHC pending. Plan: Symptom management Follow IHC from ascites fluid Ultrasound paracentesis ordered for Monday 10/21 Tumor markers, CEA, CA 19-9, CA125 pending Oncology consult Monday 10/21  Hypotension Essential hypertension Secondary to volume depletion from intractable vomiting Blood pressure improved Coreg per home dose Amlodipine per home dose   AKI (acute kidney injury) (HCC) Prerenal/ATN from volume depletion related to intractable vomiting Creatinine 1.19 up from recent baseline of 0.59 Creatinine improving.  No indication for IV fluids   Diarrhea Resolved low suspicion for infectious diarrhea.     History of MSSA bacteremia due to osteomyelitis L foot 09/18/22 S/p left BKA 9/17 secondary to osteomyelitis Patient completed cefazolin a couple weeks prior No acute issues suspected TEE on 9/17 was negative for vegetations   Obesity, Class III, BMI 40-49.9 (morbid obesity) (HCC) Complicating factor to overall prognosis and care   Diabetes mellitus, type 2 (HCC) Sliding scale insulin coverage   Clinical depression Continue home venlafaxine and alprazolam   Asthma without status asthmaticus Not acutely exacerbated Continue Singulair Albuterol as needed    DVT prophylaxis: SCD Code Status: Full Family Communication: Spouse and mother at bedside 10/16, 10/17, 10/20 Disposition Plan: Status is: Inpatient Remains inpatient  appropriate because: Malignant ascites.  Unknown primary.  Further workup in progress.   Level of care: Telemetry Medical  Consultants:  Interventional radiology  Procedures:  Ultrasound-guided paracentesis  Antimicrobials:   Subjective: Seen examined.  Resting in bed.  Endorsing abdominal pain.  Objective: Vitals:   10/26/22 1648 10/26/22 2023 10/27/22 0337 10/27/22 0929  BP: 128/62 (!) 142/62 (!) 174/67 136/68  Pulse: 70 75 73 69  Resp: 16 18 18 16   Temp: 98.9 F (37.2 C) 98.6 F (37 C) 98.2 F (36.8 C) 98 F (36.7 C)  TempSrc: Oral Oral Oral Oral  SpO2: 95% 96% 97% 96%  Weight:      Height:       No intake or output data in the 24 hours ending 10/27/22 1155  Filed Weights   10/22/22 1451 10/24/22 0411 10/25/22 0500  Weight: 122 kg 117.8 kg 119.8 kg    Examination:  General exam: Mild distress due to pain Respiratory system: Bibasilar crackles.  Normal work of breathing.  Room air Cardiovascular system: S1-S2, RRR, no murmurs, no pedal edema Gastrointestinal system:, Soft, mild distention Central nervous system: Alert and oriented. No focal neurological deficits. Extremities: Left lower extremity BKA Skin: No rashes, lesions or ulcers Psychiatry: Judgement and insight appear normal. Mood & affect appropriate.     Data Reviewed: I have personally reviewed following labs and imaging studies  CBC: Recent Labs  Lab 10/22/22 1526 10/23/22 0510 10/26/22 0807  WBC 11.7* 9.7 10.2  NEUTROABS 8.7*  --  6.3  HGB 9.8* 9.2* 10.2*  HCT 31.4* 29.1* 32.5*  MCV 91.3 90.4 90.5  PLT 552* 537* 689*   Basic Metabolic Panel: Recent Labs  Lab 10/22/22 1526 10/23/22 0510 10/26/22 0807  NA 133* 132* 134*  K 5.1 4.8 5.5*  CL 97* 99 100  CO2 24 24 27   GLUCOSE 125* 127* 150*  BUN 54* 48* 34*  CREATININE 1.19* 1.13* 0.80  CALCIUM 8.8* 8.3* 8.9   GFR: Estimated Creatinine Clearance: 107.3 mL/min (by C-G formula based on SCr of 0.8 mg/dL). Liver Function  Tests: Recent Labs  Lab 10/22/22 1526 10/23/22 0510  AST 14* 13*  ALT 7 7  ALKPHOS 56 53  BILITOT 0.6 0.5  PROT 8.3* 7.3  ALBUMIN 3.0* 2.6*   Recent Labs  Lab 10/22/22 1603  LIPASE 23   No results for input(s): "AMMONIA" in the last 168 hours. Coagulation Profile: No results for input(s): "INR", "PROTIME" in the last 168 hours. Cardiac Enzymes: No results for input(s): "CKTOTAL", "CKMB", "CKMBINDEX", "TROPONINI" in the last 168 hours. BNP (last 3 results) No results for input(s): "PROBNP" in the last 8760 hours. HbA1C: No results for input(s): "HGBA1C" in the last 72 hours. CBG: Recent Labs  Lab 10/26/22 2053 10/26/22 2356 10/27/22 0332 10/27/22 0739 10/27/22 1116  GLUCAP 142* 147* 136* 139* 178*   Lipid Profile: No results for input(s): "CHOL", "HDL", "LDLCALC", "TRIG", "CHOLHDL", "LDLDIRECT" in the last 72 hours. Thyroid Function Tests: No results for input(s): "TSH", "T4TOTAL", "FREET4", "T3FREE", "THYROIDAB" in the last 72 hours. Anemia Panel: No results for input(s): "VITAMINB12", "FOLATE", "FERRITIN", "TIBC", "IRON", "RETICCTPCT" in the last 72 hours. Sepsis Labs: Recent Labs  Lab 10/22/22 1526  PROCALCITON 0.12  LATICACIDVEN 1.0    Recent Results (from the past 240 hour(s))  Resp panel by RT-PCR (RSV, Flu A&B, Covid) Anterior Nasal Swab     Status: None   Collection Time: 10/22/22  4:03 PM   Specimen: Anterior Nasal Swab  Result Value Ref Range  Status   SARS Coronavirus 2 by RT PCR NEGATIVE NEGATIVE Final    Comment: (NOTE) SARS-CoV-2 target nucleic acids are NOT DETECTED.  The SARS-CoV-2 RNA is generally detectable in upper respiratory specimens during the acute phase of infection. The lowest concentration of SARS-CoV-2 viral copies this assay can detect is 138 copies/mL. A negative result does not preclude SARS-Cov-2 infection and should not be used as the sole basis for treatment or other patient management decisions. A negative result may  occur with  improper specimen collection/handling, submission of specimen other than nasopharyngeal swab, presence of viral mutation(s) within the areas targeted by this assay, and inadequate number of viral copies(<138 copies/mL). A negative result must be combined with clinical observations, patient history, and epidemiological information. The expected result is Negative.  Fact Sheet for Patients:  BloggerCourse.com  Fact Sheet for Healthcare Providers:  SeriousBroker.it  This test is no t yet approved or cleared by the [place] FDA and  has been authorized for detection and/or diagnosis of SARS-CoV-2 by FDA under an Emergency Use Authorization (EUA). This EUA will remain  in effect (meaning this test can be used) for the duration of the COVID-19 declaration under Section 564(b)(1) of the Act, 21 U.S.C.section 360bbb-3(b)(1), unless the authorization is terminated  or revoked sooner.       Influenza A by PCR NEGATIVE NEGATIVE Final   Influenza B by PCR NEGATIVE NEGATIVE Final    Comment: (NOTE) The Xpert Xpress SARS-CoV-2/FLU/RSV plus assay is intended as an aid in the diagnosis of influenza from Nasopharyngeal swab specimens and should not be used as a sole basis for treatment. Nasal washings and aspirates are unacceptable for Xpert Xpress SARS-CoV-2/FLU/RSV testing.  Fact Sheet for Patients: BloggerCourse.com  Fact Sheet for Healthcare Providers: SeriousBroker.it  This test is not yet approved or cleared by the [place] FDA and has been authorized for detection and/or diagnosis of SARS-CoV-2 by FDA under an Emergency Use Authorization (EUA). This EUA will remain in effect (meaning this test can be used) for the duration of the COVID-19 declaration under Section 564(b)(1) of the Act, 21 U.S.C. section 360bbb-3(b)(1), unless the authorization is terminated  or revoked.     Resp Syncytial Virus by PCR NEGATIVE NEGATIVE Final    Comment: (NOTE) Fact Sheet for Patients: BloggerCourse.com  Fact Sheet for Healthcare Providers: SeriousBroker.it  This test is not yet approved or cleared by the [place] FDA and has been authorized for detection and/or diagnosis of SARS-CoV-2 by FDA under an Emergency Use Authorization (EUA). This EUA will remain in effect (meaning this test can be used) for the duration of the COVID-19 declaration under Section 564(b)(1) of the Act, 21 U.S.C. section 360bbb-3(b)(1), unless the authorization is terminated or revoked.  Performed at [facility], [address] [place], [place] [address]   Body fluid culture w Gram Stain     Status: None   Collection Time: 10/23/22  2:20 PM   Specimen: PATH Cytology Peritoneal fluid  Result Value Ref Range Status   Specimen Description   Final    PERITONEAL Performed at [facility], [address], [place], [place] [address]    Special Requests   Final    PERITONEAL Performed at [facility], [address] [place], [place] [address]    Gram Stain   Final    WBC PRESENT,BOTH PMN AND MONONUCLEAR NO ORGANISMS SEEN CYTOSPIN SMEAR    Culture   Final    NO GROWTH 3 DAYS Performed  at [facility], [address], [place], [place] [address]    Report Status 10/27/2022 FINAL  Final         Radiology Studies: No results found.      Scheduled Meds:  acidophilus  1 capsule Oral Daily   aspirin EC  81 mg Oral Daily   carvedilol  6.25 mg Oral BID   insulin aspart  0-20 Units Subcutaneous Q4H   iron polysaccharides  150 mg Oral Daily   montelukast  10 mg Oral QHS   multivitamin with minerals  1 tablet Oral Daily   pantoprazole  40 mg Oral Daily   polyethylene glycol  17 g Oral Daily   Ensure Max Protein  11 oz Oral TID   senna-docusate  1 tablet Oral BID    venlafaxine XR  150 mg Oral QHS   zinc sulfate  220 mg Oral Daily   Continuous Infusions:  promethazine (PHENERGAN) injection (IM or IVPB)       LOS: 5 days      Tresa Moore, MD [facility]   If 7PM-7AM, please contact night-coverage  10/27/2022, 11:55 AM

## 2022-10-28 ENCOUNTER — Inpatient Hospital Stay: Payer: 59

## 2022-10-28 DIAGNOSIS — E1165 Type 2 diabetes mellitus with hyperglycemia: Secondary | ICD-10-CM | POA: Diagnosis not present

## 2022-10-28 DIAGNOSIS — R180 Malignant ascites: Secondary | ICD-10-CM | POA: Diagnosis not present

## 2022-10-28 DIAGNOSIS — D649 Anemia, unspecified: Secondary | ICD-10-CM | POA: Diagnosis not present

## 2022-10-28 DIAGNOSIS — Z794 Long term (current) use of insulin: Secondary | ICD-10-CM

## 2022-10-28 DIAGNOSIS — C482 Malignant neoplasm of peritoneum, unspecified: Secondary | ICD-10-CM

## 2022-10-28 LAB — GLUCOSE, CAPILLARY
Glucose-Capillary: 137 mg/dL — ABNORMAL HIGH (ref 70–99)
Glucose-Capillary: 152 mg/dL — ABNORMAL HIGH (ref 70–99)
Glucose-Capillary: 155 mg/dL — ABNORMAL HIGH (ref 70–99)
Glucose-Capillary: 157 mg/dL — ABNORMAL HIGH (ref 70–99)
Glucose-Capillary: 158 mg/dL — ABNORMAL HIGH (ref 70–99)
Glucose-Capillary: 164 mg/dL — ABNORMAL HIGH (ref 70–99)

## 2022-10-28 LAB — CEA: CEA: <0.6 ng/mL (ref 0.0–4.7)

## 2022-10-28 LAB — CA 19-9 (SERIAL): CA 19-9: 5 U/mL (ref 0–35)

## 2022-10-28 LAB — CA 125: Cancer Antigen (CA) 125: 762 U/mL — ABNORMAL HIGH (ref 0.0–38.1)

## 2022-10-28 MED ORDER — TRAMADOL HCL 50 MG PO TABS: 50.0000 mg | ORAL_TABLET | Freq: Four times a day (QID) | ORAL | Status: DC | PRN

## 2022-10-28 MED ORDER — HYDROMORPHONE HCL 1 MG/ML IJ SOLN
1.0000 mg | INTRAMUSCULAR | Status: DC | PRN
  Administered 2022-10-28 – 2022-10-30 (×6): 1 mg via INTRAVENOUS
  Filled 2022-10-28 (×6): qty 1

## 2022-10-28 MED ORDER — ENOXAPARIN SODIUM 60 MG/0.6ML IJ SOSY
0.5000 mg/kg | PREFILLED_SYRINGE | INTRAMUSCULAR | Status: DC
  Administered 2022-10-28 – 2022-10-29 (×2): 60 mg via SUBCUTANEOUS
  Filled 2022-10-28 (×2): qty 0.6

## 2022-10-28 MED ORDER — LIDOCAINE HCL (PF) 1 % IJ SOLN
10.0000 mL | Freq: Once | INTRAMUSCULAR | Status: AC
  Administered 2022-10-28: 10 mL via INTRADERMAL

## 2022-10-28 NOTE — Evaluation (Signed)
Occupational Therapy Evaluation Patient Details Name: Nancy [name] MRN: [id number] DOB: 05/09/1965 Today's Date: 10/28/2022   History of Present Illness 57 y.o. female who presented with abdominal pain with CT showing large volume ascites and omental caking concerning for peritoneal carcinomatosis.  Patient has past medical history significant for type 2 diabetes, CAD, asthma, chronic pain, diabetic foot infection, GERD, hyperlipidemia, Takotsubo cardiomyopathy hospitalized from 9/11 to 09/30/2022 with sepsis secondary to diabetic foot infection/osteomyelitis s/p left BKA 09/24/22. Presented with abdominal pain/nausea found to have malignant ascites.   Clinical Impression   Ms Whiton was seen for OT evaluation this date. Prior to hospital admission, pt was MOD I using manual w/c. Pt lives with spouse. Pt currently requires SBA + BRW for BSC t/f, CGA + BRW for toilet t/f ~5 ft. MOD I seated grooming/dressing tasks. Pt presents near recent functional baseline, family educated on updated DME recs. No acute OT needs, will sign off. Upon hospital discharge, recommend return home with Outpatient OT follow up.    If plan is discharge home, recommend the following: A little help with bathing/dressing/bathroom;A little help with walking and/or transfers;Help with stairs or ramp for entrance    Functional Status Assessment  Patient has had a recent decline in their functional status and demonstrates the ability to make significant improvements in function in a reasonable and predictable amount of time.  Equipment Recommendations  Tub/shower bench    Recommendations for Other Services       Precautions / Restrictions Precautions Precautions: Fall Restrictions Weight Bearing Restrictions: No      Mobility Bed Mobility Overal bed mobility: Modified Independent                  Transfers Overall transfer level: Needs assistance Equipment used: Rolling walker (2  wheels) Transfers: Sit to/from Stand, Bed to chair/wheelchair/BSC Sit to Stand: Supervision Stand pivot transfers: Supervision                Balance Overall balance assessment: Needs assistance Sitting-balance support: No upper extremity supported, Feet supported Sitting balance-Leahy Scale: Normal     Standing balance support: Bilateral upper extremity supported Standing balance-Leahy Scale: Fair                             ADL either performed or assessed with clinical judgement   ADL Overall ADL's : Needs assistance/impaired                                       General ADL Comments: SBA + BRW for BSC t/f, CGA + BRW for toilet t/f ~5 ft. MOD I seated grooming/dressing tasks.      Pertinent Vitals/Pain Pain Assessment Pain Assessment: 0-10 Pain Score: 5  Pain Location: stomach Pain Descriptors / Indicators: Discomfort, Grimacing Pain Intervention(s): Limited activity within patient's tolerance, Repositioned, Premedicated before session     Extremity/Trunk Assessment Upper Extremity Assessment Upper Extremity Assessment: Overall WFL for tasks assessed   Lower Extremity Assessment Lower Extremity Assessment: Generalized weakness       Communication Communication Communication: No apparent difficulties   Cognition Arousal: Alert Behavior During Therapy: WFL for tasks assessed/performed Overall Cognitive Status: Within Functional Limits for tasks assessed  Home Living Family/patient expects to be discharged to:: Private residence Living Arrangements: Spouse/significant other Available Help at Discharge: Family Type of Home: House Home Access: Ramped entrance     Home Layout: One level     Bathroom Shower/Tub: Walk-in shower         Home Equipment: Agricultural consultant (2 wheels);Shower seat;Grab bars - toilet;Grab bars - tub/shower;Hand held shower head;Wheelchair -  manual;BSC/3in1;Wheelchair - power          Prior Functioning/Environment Prior Level of Function : Independent/Modified Independent             Mobility Comments: Using BRW for BSC/wc transfers, manual w/c primarily ADLs Comments: Wife assists with wound care and IADL management,        OT Problem List: Decreased strength;Decreased activity tolerance         OT Goals(Current goals can be found in the care plan section) Acute Rehab OT Goals Patient Stated Goal: to go home OT Goal Formulation: With patient/family Time For Goal Achievement: 10/28/22 Potential to Achieve Goals: Good   AM-PAC OT "6 Clicks" Daily Activity     Outcome Measure Help from another person eating meals?: None Help from another person taking care of personal grooming?: None Help from another person toileting, which includes using toliet, bedpan, or urinal?: None Help from another person bathing (including washing, rinsing, drying)?: A Little Help from another person to put on and taking off regular upper body clothing?: A Little Help from another person to put on and taking off regular lower body clothing?: None 6 Click Score: 22   End of Session    Activity Tolerance: Patient tolerated treatment well Patient left: in bed;with call bell/phone within reach;with family/visitor present (PT in room)  OT Visit Diagnosis: Other abnormalities of gait and mobility (R26.89);Muscle weakness (generalized) (M62.81)                Time: 1000-1030 OT Time Calculation (min): 30 min Charges:  OT General Charges $OT Visit: 1 Visit OT Evaluation $OT Eval Moderate Complexity: 1 Mod OT Treatments $Self Care/Home Management : 8-22 mins  Kathie Dike, M.S. OTR/L  10/28/22, 10:44 AM  ascom [phone]

## 2022-10-28 NOTE — Progress Notes (Signed)
     [facility] REFERRAL        Occupational Therapy * Physical Therapy * Speech Therapy                           DATE 10/28/22  PATIENT NAME  Nancy [name]  PATIENT MRN [id number]       DIAGNOSIS/DIAGNOSIS CODE   DATE OF DISCHARGE: Pending       PRIMARY CARE PHYSICIAN      PCP PHONE/FAX  Miki Kins, FNP     General - Family Medicine    [phone]   Patient prefers [facility]   Dear Provider (Name: [facility] outpatient __  Fax: [phone]   I certify that I have examined this patient and that occupational/physical/speech therapy is necessary on an outpatient basis.    The patient has expressed interest in completing their recommended course of therapy at your  location.  Once a formal order from the patient's primary care physician has been obtained, please  contact him/her to schedule an appointment for evaluation at your earliest convenience.   [ x]  Physical Therapy Evaluate and Treat  [ x ]  Occupational Therapy Evaluate and Treat  [  ]  Speech Therapy Evaluate and Treat         The patient's primary care physician (listed above) must furnish and be responsible for a formal order such that the recommended services may be furnished while under the primary physician's care, and that the plan of care will be established and reviewed every 30 days (or more often if condition necessitates).

## 2022-10-28 NOTE — Progress Notes (Signed)
Orthopedic Tech Progress Note Patient Details:  LIZVET TANTILLO 03-15-1965 [id number] Called in order to [employer] for shrinker Patient ID: Junie Panning, female   DOB: 1965/06/05, 57 y.o.   MRN: [id number]  Lovett Calender 10/28/2022, 3:28 PM

## 2022-10-28 NOTE — Evaluation (Signed)
Physical Therapy Evaluation Patient Details Name: Nancy [name] MRN: [id number] DOB: 08/06/1965 Today's Date: 10/28/2022  History of Present Illness  57 y.o. female who presented with abdominal pain with CT showing large volume ascites and omental caking concerning for peritoneal carcinomatosis.  Patient has past medical history significant for type 2 diabetes, CAD, asthma, chronic pain, diabetic foot infection, GERD, hyperlipidemia, Takotsubo cardiomyopathy hospitalized from 9/11 to 09/30/2022 with sepsis secondary to diabetic foot infection/osteomyelitis s/p left BKA 09/24/22. Presented with abdominal pain/nausea found to have malignant ascites.  Hospital course significant for paracentesis (10/16) with 3.9L fluid removed, repeat paracentesis (10/21) with approx 3.1L fluid removed.  Onconlogy consult pending.  Clinical Impression  Patient received on [facility] with OT at beginning of session; alert and oriented, follows commands and eager for activity progression as tolerated.  Endorses abdominal pain 5/10; meds received prior to session.  Bilat UE/LE strength and ROM grossly symmetrical (L BKA) and WFL for basic transfers and mobility; L LE ace wrap donned throughout session (adjusted for improved shaping during session).  Currently able to complete bed mobility with mod indep; sit/stand, standing balance and short-distance (10' x2) with bariatric RW, cga/min assist +1 for safety. Demonstrates hop-to gait pattern; heavy WBing bilat UEs, limited balance in modified R SLS (limited abilty to recover any external perturbation); recommend +1 physical assist for all movement attempts with RW  Reviewed role of limb wrapping/shaping, positioning and desensitization techniques; patient/spouse voiced understanding, very appreciative of education. Would benefit from skilled PT to address above deficits and promote optimal return to PLOF.; recommend post-acute PT follow up as indicated by interdisciplinary care  team.             If plan is discharge home, recommend the following: A little help with walking and/or transfers;A little help with bathing/dressing/bathroom   Can travel by private vehicle        Equipment Recommendations    Recommendations for Other Services       Functional Status Assessment Patient has had a recent decline in their functional status and demonstrates the ability to make significant improvements in function in a reasonable and predictable amount of time.     Precautions / Restrictions Precautions Precautions: Fall Restrictions Weight Bearing Restrictions: No      Mobility  Bed Mobility Overal bed mobility: Modified Independent                  Transfers Overall transfer level: Needs assistance Equipment used: Rolling walker (2 wheels) Transfers: Sit to/from Stand, Bed to chair/wheelchair/BSC Sit to Stand: Min assist                Ambulation/Gait Ambulation/Gait assistance: Min assist Gait Distance (Feet): 10 Feet Assistive device: Rolling walker (2 wheels)         General Gait Details: hop-to gait pattern; heavy WBing bilat UEs, limited balance in modified R SLS (limited abilty to recover any external perturbation); recommend +1 physical assist for all movement attempts with RW  Stairs            Wheelchair Mobility     Tilt Bed    Modified Rankin (Stroke Patients Only)       Balance Overall balance assessment: Needs assistance Sitting-balance support: No upper extremity supported, Feet supported Sitting balance-Leahy Scale: Good     Standing balance support: Bilateral upper extremity supported Standing balance-Leahy Scale: Fair  Pertinent Vitals/Pain Pain Assessment Pain Assessment: Faces Pain Score: 5  Pain Location: stomach Pain Descriptors / Indicators: Discomfort, Grimacing Pain Intervention(s): Limited activity within patient's tolerance, Monitored during  session, Repositioned, Premedicated before session    Home Living Family/patient expects to be discharged to:: Private residence Living Arrangements: Spouse/significant other Available Help at Discharge: Family Type of Home: House Home Access: Ramped entrance       Home Layout: One level Home Equipment: Agricultural consultant (2 wheels);Shower seat;Grab bars - toilet;Grab bars - tub/shower;Hand held shower head;Wheelchair - manual;BSC/3in1;Wheelchair - power      Prior Function Prior Level of Function : Independent/Modified Independent             Mobility Comments: Using BRW for BSC/wc transfers, manual w/c primarily; does endorse single fall since recent discharge (EMS assist for recovery) ADLs Comments: Wife assists with wound care and IADL management,     Extremity/Trunk Assessment   Upper Extremity Assessment Upper Extremity Assessment: Overall WFL for tasks assessed    Lower Extremity Assessment Lower Extremity Assessment: Generalized weakness (grossly at least 4/5 throughout; neutral extension of L hip/knee.  Ace wrap to L BKA intact)       Communication   Communication Communication: No apparent difficulties  Cognition Arousal: Alert Behavior During Therapy: WFL for tasks assessed/performed Overall Cognitive Status: Within Functional Limits for tasks assessed                                          General Comments      Exercises Other Exercises Other Exercises: Educated patient, wife and mother on L LE wrapping/shaping (re-wrapped to ensure rounded shape/corners), positioning and desensitization techniques.  All voiced understanding and appreciative of information. Other Exercises: Discussed rehab options at discharge-HH vs outpatient; patient, wife prefer outpatient, as they feel comfortable with level of care/support at home.  Eager to start process for prosthesis. Other Exercises: Standing weight per patient request (cga/min assist to stand  at scale, bilat UEsupport): 258.9lbs.  RN informed/aware   Assessment/Plan    PT Assessment Patient needs continued PT services  PT Problem List Decreased strength;Decreased range of motion;Decreased balance;Decreased activity tolerance;Decreased mobility;Decreased coordination;Decreased cognition;Decreased knowledge of use of DME;Decreased safety awareness;Decreased knowledge of precautions       PT Treatment Interventions DME instruction;Gait training;Functional mobility training;Therapeutic exercise;Therapeutic activities;Patient/family education;Wheelchair mobility training;Balance training    PT Goals (Current goals can be found in the Care Plan section)  Acute Rehab PT Goals Patient Stated Goal: to return home PT Goal Formulation: With patient/family Time For Goal Achievement: 11/11/22 Potential to Achieve Goals: Good    Frequency Min 1X/week     Co-evaluation               AM-PAC PT "6 Clicks" Mobility  Outcome Measure Help needed turning from your back to your side while in a flat bed without using bedrails?: None Help needed moving from lying on your back to sitting on the side of a flat bed without using bedrails?: None Help needed moving to and from a bed to a chair (including a wheelchair)?: A Little Help needed standing up from a chair using your arms (e.g., wheelchair or bedside chair)?: A Little Help needed to walk in hospital room?: A Little Help needed climbing 3-5 steps with a railing? : A Little 6 Click Score: 20    End of Session   Activity Tolerance:  Patient tolerated treatment well Patient left: in bed;with call bell/phone within reach;with family/visitor present;with bed alarm set Nurse Communication: Mobility status PT Visit Diagnosis: Muscle weakness (generalized) (M62.81);Difficulty in walking, not elsewhere classified (R26.2)    Time: 7893-8101 PT Time Calculation (min) (ACUTE ONLY): 27 min   Charges:   PT Evaluation $PT Eval Moderate  Complexity: 1 Mod   PT General Charges $$ ACUTE PT VISIT: 1 Visit         Lukah Goswami H. Manson Passey, PT, DPT, NCS 10/28/22, 11:18 AM [phone]

## 2022-10-28 NOTE — Procedures (Signed)
PROCEDURE SUMMARY:  Successful US guided paracentesis from left lower quadrant.  Yielded 3.1 L of clear yellow fluid.  No immediate complications.  Pt tolerated well.   Specimen not sent for labs.  EBL < 2 mL  Mickie Kay, NP 10/28/2022 10:30 AM

## 2022-10-28 NOTE — Progress Notes (Signed)
PROGRESS NOTE    Nancy [name]  HKV:[id number] DOB: [birth year] DOA: 10/22/2022 PCP: Miki Kins, FNP    Brief Narrative:  57 y.o. female who presented with abdominal pain with CT showing large volume ascites and omental caking concerning for peritoneal carcinomatosis.  Patient has past medical history significant for type 2 diabetes, CAD, asthma, chronic pain, diabetic foot infection, GERD, hyperlipidemia, Takotsubo cardiomyopathy hospitalized from 9/11 to 09/30/2022 with sepsis secondary to diabetic foot infection/osteomyelitis s/p left BKA 9/17, also found to have Staph aureus bacteremia with TEE negative for vegetations, discharged home with 2 weeks of IV cefazolin, completed a few days ago, who presents to the ED with a 6-day history of nonbloody nonbilious vomiting Associated with generalized abdominal pain and distention.  States during her recent past hospitalization she initially presented with bleeding from her rectum but it spontaneously resolved.  She had abdominal pain and nausea but she attributed it to her foot infection.  She denies a history of unintentional weight loss or poor appetite except for when her vomiting started a week ago.   She denies cough, chest pain, shortness of breath, leg pain, black or bloody stool except for one-time episode prior to her last hospitalization and denies, dysuria.   Status post ultrasound-guided paracentesis on 10/16.  3.9 L yellow fluid removed  10/21: Initial fluid from ascites positive for malignant cells.  IHC pending.  CA125 elevated.  Indicating primary ovarian carcinoma.  Oncology involved.  Repeat paracentesis for symptom relief.  Additional 3 L fluid removed.  Assessment & Plan:   Principal Problem:   Ascites, possibly malignant Active Problems:   Intractable vomiting   Hypotension   Essential hypertension   AKI (acute kidney injury) (HCC)   History of MSSA bacteremia due to osteomyelitis L foot 09/18/22   S/P left  BKA  09/24/22 secondary to osteomyelitis (below knee amputation) (HCC)   Diarrhea   Asthma without status asthmaticus   Clinical depression   Takotsubo cardiomyopathy   Diabetes mellitus, type 2 (HCC)   Obesity, Class III, BMI 40-49.9 (morbid obesity) (HCC)   Normocytic anemia   Malignant ascites Peritoneal caking on CT/ ?Carcinomatosis Intractable vomiting SIRS SIRS criteria include leukocytosis and hypotension with temp of 100.6, suspect related to vomiting versus infection CT showing large volume ascites with omental caking concerning for peritoneal malignancy-carcinomatosis but no mass Status post paracentesis with 3.9 L fluid removed on 10/16 No evidence of SBP Ascitic fluid culture remains negative 10/19: Discussed with pathology.  Malignant cells noted on ascites fluid.  IHC pending. 10/21: Repeat paracentesis with 3 L fluid removed Plan: Oncology consulted.  Follow IHC.  Symptom management.  Consider omental biopsy if IHC unrevealing for primary mass  Hypotension Essential hypertension Secondary to volume depletion from intractable vomiting Blood pressure improved Coreg per home dose Amlodipine per home dose   AKI (acute kidney injury) (HCC) Prerenal/ATN from volume depletion related to intractable vomiting Creatinine 1.19 up from recent baseline of 0.59 Creatinine improving.  No indication for IV fluids   Diarrhea Resolved low suspicion for infectious diarrhea.     History of MSSA bacteremia due to osteomyelitis L foot 09/18/22 S/p left BKA 9/17 secondary to osteomyelitis Patient completed cefazolin a couple weeks prior No acute issues suspected TEE on 9/17 was negative for vegetations   Obesity, Class III, BMI 40-49.9 (morbid obesity) (HCC) Complicating factor to overall prognosis and care   Diabetes mellitus, type 2 (HCC) Sliding scale insulin coverage   Clinical depression Continue home venlafaxine and  alprazolam   Asthma without status asthmaticus Not  acutely exacerbated Continue Singulair Albuterol as needed    DVT prophylaxis: SCD Code Status: Full Family Communication: Spouse and mother at bedside 10/16, 10/17, 10/20, 10/21 Disposition Plan: Status is: Inpatient Remains inpatient appropriate because: Malignant ascites.  Status post paracentesis x 2.  Suspect ovarian primary.  Further workup in progress.   Level of care: Telemetry Medical  Consultants:  Interventional radiology  Procedures:  Ultrasound-guided paracentesis  Antimicrobials:   Subjective: Seen and examined.  Appears anxious about likely diagnosis.  Otherwise pain control appears improved.  Objective: Vitals:   10/28/22 0735 10/28/22 0845 10/28/22 0900 10/28/22 1100  BP: 127/77 (!) 143/63 (!) 126/54   Pulse: 67 70 70   Resp: 18     Temp: 98.5 F (36.9 C)     TempSrc:      SpO2: 95% 94% 92%   Weight:    117.4 kg  Height:        Intake/Output Summary (Last 24 hours) at 10/28/2022 1441 Last data filed at 10/27/2022 1741 Gross per 24 hour  Intake --  Output 1 ml  Net -1 ml    Filed Weights   10/24/22 0411 10/25/22 0500 10/28/22 1100  Weight: 117.8 kg 119.8 kg 117.4 kg    Examination:  General exam: Anxious appearing Respiratory system: Bibasilar crackles.  Normal work of breathing.  Room air Cardiovascular system: S1-S2, RRR, no murmurs, no pedal edema Gastrointestinal system:, Soft, nontender, nondistended, normal bowel sounds Central nervous system: Alert and oriented. No focal neurological deficits. Extremities: Left lower extremity BKA Skin: No rashes, lesions or ulcers Psychiatry: Judgement and insight appear normal. Mood & affect appropriate.     Data Reviewed: I have personally reviewed following labs and imaging studies  CBC: Recent Labs  Lab 10/22/22 1526 10/23/22 0510 10/26/22 0807  WBC 11.7* 9.7 10.2  NEUTROABS 8.7*  --  6.3  HGB 9.8* 9.2* 10.2*  HCT 31.4* 29.1* 32.5*  MCV 91.3 90.4 90.5  PLT 552* 537* 689*    Basic Metabolic Panel: Recent Labs  Lab 10/22/22 1526 10/23/22 0510 10/26/22 0807  NA 133* 132* 134*  K 5.1 4.8 5.5*  CL 97* 99 100  CO2 24 24 27   GLUCOSE 125* 127* 150*  BUN 54* 48* 34*  CREATININE 1.19* 1.13* 0.80  CALCIUM 8.8* 8.3* 8.9   GFR: Estimated Creatinine Clearance: 106.2 mL/min (by C-G formula based on SCr of 0.8 mg/dL). Liver Function Tests: Recent Labs  Lab 10/22/22 1526 10/23/22 0510  AST 14* 13*  ALT 7 7  ALKPHOS 56 53  BILITOT 0.6 0.5  PROT 8.3* 7.3  ALBUMIN 3.0* 2.6*   Recent Labs  Lab 10/22/22 1603  LIPASE 23   No results for input(s): "AMMONIA" in the last 168 hours. Coagulation Profile: No results for input(s): "INR", "PROTIME" in the last 168 hours. Cardiac Enzymes: No results for input(s): "CKTOTAL", "CKMB", "CKMBINDEX", "TROPONINI" in the last 168 hours. BNP (last 3 results) No results for input(s): "PROBNP" in the last 8760 hours. HbA1C: No results for input(s): "HGBA1C" in the last 72 hours. CBG: Recent Labs  Lab 10/27/22 1931 10/27/22 2356 10/28/22 0428 10/28/22 0738 10/28/22 1139  GLUCAP 178* 131* 157* 152* 158*   Lipid Profile: No results for input(s): "CHOL", "HDL", "LDLCALC", "TRIG", "CHOLHDL", "LDLDIRECT" in the last 72 hours. Thyroid Function Tests: No results for input(s): "TSH", "T4TOTAL", "FREET4", "T3FREE", "THYROIDAB" in the last 72 hours. Anemia Panel: No results for input(s): "VITAMINB12", "FOLATE", "FERRITIN", "TIBC", "IRON", "  RETICCTPCT" in the last 72 hours. Sepsis Labs: Recent Labs  Lab 10/22/22 1526  PROCALCITON 0.12  LATICACIDVEN 1.0    Recent Results (from the past 240 hour(s))  Resp panel by RT-PCR (RSV, Flu A&B, Covid) Anterior Nasal Swab     Status: None   Collection Time: 10/22/22  4:03 PM   Specimen: Anterior Nasal Swab  Result Value Ref Range Status   SARS Coronavirus 2 by RT PCR NEGATIVE NEGATIVE Final    Comment: (NOTE) SARS-CoV-2 target nucleic acids are NOT DETECTED.  The  SARS-CoV-2 RNA is generally detectable in upper respiratory specimens during the acute phase of infection. The lowest concentration of SARS-CoV-2 viral copies this assay can detect is 138 copies/mL. A negative result does not preclude SARS-Cov-2 infection and should not be used as the sole basis for treatment or other patient management decisions. A negative result may occur with  improper specimen collection/handling, submission of specimen other than nasopharyngeal swab, presence of viral mutation(s) within the areas targeted by this assay, and inadequate number of viral copies(<138 copies/mL). A negative result must be combined with clinical observations, patient history, and epidemiological information. The expected result is Negative.  Fact Sheet for Patients:  BloggerCourse.com  Fact Sheet for Healthcare Providers:  SeriousBroker.it  This test is no t yet approved or cleared by the [place] FDA and  has been authorized for detection and/or diagnosis of SARS-CoV-2 by FDA under an Emergency Use Authorization (EUA). This EUA will remain  in effect (meaning this test can be used) for the duration of the COVID-19 declaration under Section 564(b)(1) of the Act, 21 U.S.C.section 360bbb-3(b)(1), unless the authorization is terminated  or revoked sooner.       Influenza A by PCR NEGATIVE NEGATIVE Final   Influenza B by PCR NEGATIVE NEGATIVE Final    Comment: (NOTE) The Xpert Xpress SARS-CoV-2/FLU/RSV plus assay is intended as an aid in the diagnosis of influenza from Nasopharyngeal swab specimens and should not be used as a sole basis for treatment. Nasal washings and aspirates are unacceptable for Xpert Xpress SARS-CoV-2/FLU/RSV testing.  Fact Sheet for Patients: BloggerCourse.com  Fact Sheet for Healthcare Providers: SeriousBroker.it  This test is not yet approved or  cleared by the [place] FDA and has been authorized for detection and/or diagnosis of SARS-CoV-2 by FDA under an Emergency Use Authorization (EUA). This EUA will remain in effect (meaning this test can be used) for the duration of the COVID-19 declaration under Section 564(b)(1) of the Act, 21 U.S.C. section 360bbb-3(b)(1), unless the authorization is terminated or revoked.     Resp Syncytial Virus by PCR NEGATIVE NEGATIVE Final    Comment: (NOTE) Fact Sheet for Patients: BloggerCourse.com  Fact Sheet for Healthcare Providers: SeriousBroker.it  This test is not yet approved or cleared by the [place] FDA and has been authorized for detection and/or diagnosis of SARS-CoV-2 by FDA under an Emergency Use Authorization (EUA). This EUA will remain in effect (meaning this test can be used) for the duration of the COVID-19 declaration under Section 564(b)(1) of the Act, 21 U.S.C. section 360bbb-3(b)(1), unless the authorization is terminated or revoked.  Performed at [facility], [address] [place], [place] [address]   Body fluid culture w Gram Stain     Status: None   Collection Time: 10/23/22  2:20 PM   Specimen: PATH Cytology Peritoneal fluid  Result Value Ref Range Status   Specimen Description   Final    PERITONEAL Performed at [facility],  [address], [place], [place] [address]    Special Requests   Final    PERITONEAL Performed at [facility], [address] [place], [place] [address]    Gram Stain   Final    WBC PRESENT,BOTH PMN AND MONONUCLEAR NO ORGANISMS SEEN CYTOSPIN SMEAR    Culture   Final    NO GROWTH 3 DAYS Performed at [facility], [address], [place], [place] [address]    Report Status 10/27/2022 FINAL  Final         Radiology Studies: US Paracentesis  Result Date: 10/28/2022 INDICATION: Patient with peritoneal carcinomatosis with  recurrent malignant ascites. Therapeutic paracentesis requested. EXAM: ULTRASOUND GUIDED PARACENTESIS MEDICATIONS: 1% lidocaine 10 mL COMPLICATIONS: None immediate. PROCEDURE: Informed written consent was obtained from the patient after a discussion of the risks, benefits and alternatives to treatment. A timeout was performed prior to the initiation of the procedure. Initial ultrasound scanning demonstrates a large amount of ascites within the left lower abdominal quadrant. The left lower abdomen was prepped and draped in the usual sterile fashion. 1% lidocaine was used for local anesthesia. Following this, a 19 gauge, 10-cm, Yueh catheter was introduced. An ultrasound image was saved for documentation purposes. The paracentesis was performed. The catheter was removed and a dressing was applied. The patient tolerated the procedure well without immediate post procedural complication. Patient received post-procedure intravenous albumin; see nursing notes for details. FINDINGS: A total of approximately 3.1 L of clear yellow fluid was removed. IMPRESSION: Successful ultrasound-guided paracentesis yielding 3.1 liters of peritoneal fluid. Procedure performed by Alwyn Ren NP Electronically Signed   By: Malachy Moan M.D.   On: 10/28/2022 11:04        Scheduled Meds:  acidophilus  1 capsule Oral Daily   amLODipine  5 mg Oral Daily   aspirin EC  81 mg Oral Daily   carvedilol  6.25 mg Oral BID   enoxaparin (LOVENOX) injection  0.5 mg/kg Subcutaneous Q24H   insulin aspart  0-20 Units Subcutaneous Q4H   iron polysaccharides  150 mg Oral Daily   montelukast  10 mg Oral QHS   multivitamin with minerals  1 tablet Oral Daily   pantoprazole  40 mg Oral Daily   polyethylene glycol  17 g Oral Daily   Ensure Max Protein  11 oz Oral TID   senna-docusate  1 tablet Oral BID   venlafaxine XR  150 mg Oral QHS   zinc sulfate  220 mg Oral Daily   Continuous Infusions:  promethazine (PHENERGAN) injection (IM  or IVPB)       LOS: 6 days      Tresa Moore, MD [facility]   If 7PM-7AM, please contact night-coverage  10/28/2022, 2:41 PM

## 2022-10-28 NOTE — Consult Note (Signed)
Hematology/Oncology Consult note [phone] Fax:(336) [phone]      Patient Care Team: Miki Kins, FNP as PCP - General (Family Medicine)   Name of the patient: Nancy [name]  [id number]  August 17, 1965   REASON FOR COSULTATION:  Suspect ovarian cancer with malignant ascites History of presenting illness-  57 y.o. female with PMH significant for type 2 diabetes, CAD, asthma, chronic pain, diabetic foot infection, recent history of sepsis secondary to diabetic foot infection/osteomyelitis status post left BKA done on 09/24/2022, recently finished 2 weeks of IV cefazolin, presented to emergency room for evaluation of abdominal pain/bloating, poor oral intake, nonbloody nonbilious vomiting  CT abdomen pelvis showed large amount of ascites with omental caking.  Concerning for peritoneal malignancy/carcinomatosis.  No ovarian mass was visualized. 10/23/2022 patient is status post paracentesis with removal of 3.9 L of fluid, cytology came back positive for malignant cells.  IHC pending. 10/28/2022, status post repeat paracentesis due to abdominal distention and pressure caused by recurrent ascites.   Patient reports positive family history of ovarian cancer in her maternal aunt.  Patient's maternal grandmother had history of colon cancer Patient's significant other and mother are at the bedside.   Allergies  Allergen Reactions   Shellfish Allergy Anaphylaxis   Codeine Other (See Comments)    Migraine  Other Reaction(s): Other (See Comments)  Reaction:  Severe migraines   Morphine And Codeine Other (See Comments)    Reaction:  Severe migraines    Percocet [Oxycodone-Acetaminophen] Nausea And Vomiting   Rosuvastatin     Other reaction(s): Muscle Pain   Sulfa Antibiotics Rash   Theophyllines Itching and Rash    Patient Active Problem List   Diagnosis Date Noted   Ascites, possibly malignant 10/23/2022   Hypotension 10/22/2022   Intractable vomiting  10/22/2022   History of MSSA bacteremia due to osteomyelitis L foot 09/18/22 10/22/2022   S/P left  BKA 09/24/22 secondary to osteomyelitis (below knee amputation) (HCC) 10/22/2022   AKI (acute kidney injury) (HCC) 10/22/2022   Diarrhea 10/22/2022   Obesity, Class III, BMI 40-49.9 (morbid obesity) (HCC) 10/22/2022   Adjustment disorder with mixed anxiety and depressed mood 09/26/2022   MSSA bacteremia 09/20/2022   Diabetic foot infection (HCC) 09/18/2022   Osteomyelitis (HCC) 09/18/2022   Cellulitis of left lower extremity 09/18/2022   Gas gangrene (HCC) 09/18/2022   B12 deficiency due to diet 05/16/2022   Vitamin D deficiency, unspecified 05/16/2022   Diabetic ulcer of left heel associated with type 2 diabetes mellitus (HCC) 02/08/2021   Atherosclerosis of native arteries of the extremities with ulceration (HCC) 01/30/2021   Diabetes mellitus, type 2 (HCC) 03/13/2015   Sepsis secondary to diabetic foot infection 03/13/2015   Allergic rhinitis 05/10/2014   Asthma without status asthmaticus 05/10/2014   Atherosclerosis of native coronary artery of native heart with angina pectoris (HCC) 05/10/2014   Clinical depression 05/10/2014   Neuropathy, diabetic (HCC) 05/10/2014   Acid reflux 05/10/2014   Mixed hyperlipidemia 05/10/2014   Headache, migraine 05/10/2014   Obesity (BMI 30-39.9) 05/10/2014   Takotsubo cardiomyopathy 05/10/2014   Essential hypertension 08/09/2008   Heart failure (HCC) 06/06/2008     Past Medical History:  Diagnosis Date   Abnormal cardiovascular stress test 09/21/2018   Formatting of this note might be different from the original. [facility] 09/16/2018 revealed mild anterior ischemia   Adverse effect of motion 05/10/2014   Anxiety    Arthritis    r knee   Asthma    Breast cyst 05/10/2014  Hematology/Oncology Consult note [phone] Fax:(336) [phone]      Patient Care Team: Miki Kins, FNP as PCP - General (Family Medicine)   Name of the patient: Nancy [name]  [id number]  August 17, 1965   REASON FOR COSULTATION:  Suspect ovarian cancer with malignant ascites History of presenting illness-  57 y.o. female with PMH significant for type 2 diabetes, CAD, asthma, chronic pain, diabetic foot infection, recent history of sepsis secondary to diabetic foot infection/osteomyelitis status post left BKA done on 09/24/2022, recently finished 2 weeks of IV cefazolin, presented to emergency room for evaluation of abdominal pain/bloating, poor oral intake, nonbloody nonbilious vomiting  CT abdomen pelvis showed large amount of ascites with omental caking.  Concerning for peritoneal malignancy/carcinomatosis.  No ovarian mass was visualized. 10/23/2022 patient is status post paracentesis with removal of 3.9 L of fluid, cytology came back positive for malignant cells.  IHC pending. 10/28/2022, status post repeat paracentesis due to abdominal distention and pressure caused by recurrent ascites.   Patient reports positive family history of ovarian cancer in her maternal aunt.  Patient's maternal grandmother had history of colon cancer Patient's significant other and mother are at the bedside.   Allergies  Allergen Reactions   Shellfish Allergy Anaphylaxis   Codeine Other (See Comments)    Migraine  Other Reaction(s): Other (See Comments)  Reaction:  Severe migraines   Morphine And Codeine Other (See Comments)    Reaction:  Severe migraines    Percocet [Oxycodone-Acetaminophen] Nausea And Vomiting   Rosuvastatin     Other reaction(s): Muscle Pain   Sulfa Antibiotics Rash   Theophyllines Itching and Rash    Patient Active Problem List   Diagnosis Date Noted   Ascites, possibly malignant 10/23/2022   Hypotension 10/22/2022   Intractable vomiting  10/22/2022   History of MSSA bacteremia due to osteomyelitis L foot 09/18/22 10/22/2022   S/P left  BKA 09/24/22 secondary to osteomyelitis (below knee amputation) (HCC) 10/22/2022   AKI (acute kidney injury) (HCC) 10/22/2022   Diarrhea 10/22/2022   Obesity, Class III, BMI 40-49.9 (morbid obesity) (HCC) 10/22/2022   Adjustment disorder with mixed anxiety and depressed mood 09/26/2022   MSSA bacteremia 09/20/2022   Diabetic foot infection (HCC) 09/18/2022   Osteomyelitis (HCC) 09/18/2022   Cellulitis of left lower extremity 09/18/2022   Gas gangrene (HCC) 09/18/2022   B12 deficiency due to diet 05/16/2022   Vitamin D deficiency, unspecified 05/16/2022   Diabetic ulcer of left heel associated with type 2 diabetes mellitus (HCC) 02/08/2021   Atherosclerosis of native arteries of the extremities with ulceration (HCC) 01/30/2021   Diabetes mellitus, type 2 (HCC) 03/13/2015   Sepsis secondary to diabetic foot infection 03/13/2015   Allergic rhinitis 05/10/2014   Asthma without status asthmaticus 05/10/2014   Atherosclerosis of native coronary artery of native heart with angina pectoris (HCC) 05/10/2014   Clinical depression 05/10/2014   Neuropathy, diabetic (HCC) 05/10/2014   Acid reflux 05/10/2014   Mixed hyperlipidemia 05/10/2014   Headache, migraine 05/10/2014   Obesity (BMI 30-39.9) 05/10/2014   Takotsubo cardiomyopathy 05/10/2014   Essential hypertension 08/09/2008   Heart failure (HCC) 06/06/2008     Past Medical History:  Diagnosis Date   Abnormal cardiovascular stress test 09/21/2018   Formatting of this note might be different from the original. [facility] 09/16/2018 revealed mild anterior ischemia   Adverse effect of motion 05/10/2014   Anxiety    Arthritis    r knee   Asthma    Breast cyst 05/10/2014  Hematology/Oncology Consult note [phone] Fax:(336) [phone]      Patient Care Team: Miki Kins, FNP as PCP - General (Family Medicine)   Name of the patient: Nancy [name]  [id number]  August 17, 1965   REASON FOR COSULTATION:  Suspect ovarian cancer with malignant ascites History of presenting illness-  57 y.o. female with PMH significant for type 2 diabetes, CAD, asthma, chronic pain, diabetic foot infection, recent history of sepsis secondary to diabetic foot infection/osteomyelitis status post left BKA done on 09/24/2022, recently finished 2 weeks of IV cefazolin, presented to emergency room for evaluation of abdominal pain/bloating, poor oral intake, nonbloody nonbilious vomiting  CT abdomen pelvis showed large amount of ascites with omental caking.  Concerning for peritoneal malignancy/carcinomatosis.  No ovarian mass was visualized. 10/23/2022 patient is status post paracentesis with removal of 3.9 L of fluid, cytology came back positive for malignant cells.  IHC pending. 10/28/2022, status post repeat paracentesis due to abdominal distention and pressure caused by recurrent ascites.   Patient reports positive family history of ovarian cancer in her maternal aunt.  Patient's maternal grandmother had history of colon cancer Patient's significant other and mother are at the bedside.   Allergies  Allergen Reactions   Shellfish Allergy Anaphylaxis   Codeine Other (See Comments)    Migraine  Other Reaction(s): Other (See Comments)  Reaction:  Severe migraines   Morphine And Codeine Other (See Comments)    Reaction:  Severe migraines    Percocet [Oxycodone-Acetaminophen] Nausea And Vomiting   Rosuvastatin     Other reaction(s): Muscle Pain   Sulfa Antibiotics Rash   Theophyllines Itching and Rash    Patient Active Problem List   Diagnosis Date Noted   Ascites, possibly malignant 10/23/2022   Hypotension 10/22/2022   Intractable vomiting  10/22/2022   History of MSSA bacteremia due to osteomyelitis L foot 09/18/22 10/22/2022   S/P left  BKA 09/24/22 secondary to osteomyelitis (below knee amputation) (HCC) 10/22/2022   AKI (acute kidney injury) (HCC) 10/22/2022   Diarrhea 10/22/2022   Obesity, Class III, BMI 40-49.9 (morbid obesity) (HCC) 10/22/2022   Adjustment disorder with mixed anxiety and depressed mood 09/26/2022   MSSA bacteremia 09/20/2022   Diabetic foot infection (HCC) 09/18/2022   Osteomyelitis (HCC) 09/18/2022   Cellulitis of left lower extremity 09/18/2022   Gas gangrene (HCC) 09/18/2022   B12 deficiency due to diet 05/16/2022   Vitamin D deficiency, unspecified 05/16/2022   Diabetic ulcer of left heel associated with type 2 diabetes mellitus (HCC) 02/08/2021   Atherosclerosis of native arteries of the extremities with ulceration (HCC) 01/30/2021   Diabetes mellitus, type 2 (HCC) 03/13/2015   Sepsis secondary to diabetic foot infection 03/13/2015   Allergic rhinitis 05/10/2014   Asthma without status asthmaticus 05/10/2014   Atherosclerosis of native coronary artery of native heart with angina pectoris (HCC) 05/10/2014   Clinical depression 05/10/2014   Neuropathy, diabetic (HCC) 05/10/2014   Acid reflux 05/10/2014   Mixed hyperlipidemia 05/10/2014   Headache, migraine 05/10/2014   Obesity (BMI 30-39.9) 05/10/2014   Takotsubo cardiomyopathy 05/10/2014   Essential hypertension 08/09/2008   Heart failure (HCC) 06/06/2008     Past Medical History:  Diagnosis Date   Abnormal cardiovascular stress test 09/21/2018   Formatting of this note might be different from the original. [facility] 09/16/2018 revealed mild anterior ischemia   Adverse effect of motion 05/10/2014   Anxiety    Arthritis    r knee   Asthma    Breast cyst 05/10/2014  Hematology/Oncology Consult note [phone] Fax:(336) [phone]      Patient Care Team: Miki Kins, FNP as PCP - General (Family Medicine)   Name of the patient: Nancy [name]  [id number]  August 17, 1965   REASON FOR COSULTATION:  Suspect ovarian cancer with malignant ascites History of presenting illness-  57 y.o. female with PMH significant for type 2 diabetes, CAD, asthma, chronic pain, diabetic foot infection, recent history of sepsis secondary to diabetic foot infection/osteomyelitis status post left BKA done on 09/24/2022, recently finished 2 weeks of IV cefazolin, presented to emergency room for evaluation of abdominal pain/bloating, poor oral intake, nonbloody nonbilious vomiting  CT abdomen pelvis showed large amount of ascites with omental caking.  Concerning for peritoneal malignancy/carcinomatosis.  No ovarian mass was visualized. 10/23/2022 patient is status post paracentesis with removal of 3.9 L of fluid, cytology came back positive for malignant cells.  IHC pending. 10/28/2022, status post repeat paracentesis due to abdominal distention and pressure caused by recurrent ascites.   Patient reports positive family history of ovarian cancer in her maternal aunt.  Patient's maternal grandmother had history of colon cancer Patient's significant other and mother are at the bedside.   Allergies  Allergen Reactions   Shellfish Allergy Anaphylaxis   Codeine Other (See Comments)    Migraine  Other Reaction(s): Other (See Comments)  Reaction:  Severe migraines   Morphine And Codeine Other (See Comments)    Reaction:  Severe migraines    Percocet [Oxycodone-Acetaminophen] Nausea And Vomiting   Rosuvastatin     Other reaction(s): Muscle Pain   Sulfa Antibiotics Rash   Theophyllines Itching and Rash    Patient Active Problem List   Diagnosis Date Noted   Ascites, possibly malignant 10/23/2022   Hypotension 10/22/2022   Intractable vomiting  10/22/2022   History of MSSA bacteremia due to osteomyelitis L foot 09/18/22 10/22/2022   S/P left  BKA 09/24/22 secondary to osteomyelitis (below knee amputation) (HCC) 10/22/2022   AKI (acute kidney injury) (HCC) 10/22/2022   Diarrhea 10/22/2022   Obesity, Class III, BMI 40-49.9 (morbid obesity) (HCC) 10/22/2022   Adjustment disorder with mixed anxiety and depressed mood 09/26/2022   MSSA bacteremia 09/20/2022   Diabetic foot infection (HCC) 09/18/2022   Osteomyelitis (HCC) 09/18/2022   Cellulitis of left lower extremity 09/18/2022   Gas gangrene (HCC) 09/18/2022   B12 deficiency due to diet 05/16/2022   Vitamin D deficiency, unspecified 05/16/2022   Diabetic ulcer of left heel associated with type 2 diabetes mellitus (HCC) 02/08/2021   Atherosclerosis of native arteries of the extremities with ulceration (HCC) 01/30/2021   Diabetes mellitus, type 2 (HCC) 03/13/2015   Sepsis secondary to diabetic foot infection 03/13/2015   Allergic rhinitis 05/10/2014   Asthma without status asthmaticus 05/10/2014   Atherosclerosis of native coronary artery of native heart with angina pectoris (HCC) 05/10/2014   Clinical depression 05/10/2014   Neuropathy, diabetic (HCC) 05/10/2014   Acid reflux 05/10/2014   Mixed hyperlipidemia 05/10/2014   Headache, migraine 05/10/2014   Obesity (BMI 30-39.9) 05/10/2014   Takotsubo cardiomyopathy 05/10/2014   Essential hypertension 08/09/2008   Heart failure (HCC) 06/06/2008     Past Medical History:  Diagnosis Date   Abnormal cardiovascular stress test 09/21/2018   Formatting of this note might be different from the original. [facility] 09/16/2018 revealed mild anterior ischemia   Adverse effect of motion 05/10/2014   Anxiety    Arthritis    r knee   Asthma    Breast cyst 05/10/2014  status post below-the-knee amputation. No evidence of fracture, dislocation, or joint effusion. No evidence of arthropathy or other focal bone abnormality. Peripheral vascular calcifications are present. Soft tissues are otherwise within normal limits. IMPRESSION: Status post below-the-knee amputation. No acute findings. Electronically Signed   By: Darliss Cheney M.D.   On: 10/01/2022 22:50   [place] EKG SITE RITE  Result Date: 09/27/2022 If Site Rite image not attached, placement could not be confirmed due to current cardiac rhythm.  ECHOCARDIOGRAM COMPLETE  Result Date: 09/22/2022    ECHOCARDIOGRAM REPORT   Patient Name:   ILCE HAVINS Date of Exam: 09/22/2022 Medical Rec #:  [id number]           Height:       69.0 in Accession #:    [id number]          Weight:       273.4 lb Date of Birth:  22-Apr-1965          BSA:          2.359 m Patient Age:    56 years            BP:           121/54 mmHg Patient Gender: F                    HR:           74 bpm. Exam Location:  [facility] Procedure: 2D Echo and Intracardiac Opacification Agent Indications:     BACTEREMIA R78.81  History:         Patient has no prior history of Echocardiogram examinations.  Sonographer:     Overton Mam RDCS, FASE Referring Phys:  [id number] Latina Craver SCHNIER Diagnosing Phys: Debbe Odea MD  Sonographer Comments: Technically challenging study due to limited acoustic windows, suboptimal parasternal window, suboptimal apical window and patient is obese. Image acquisition challenging due to patient body habitus. IMPRESSIONS  1. Left ventricular ejection fraction, by estimation, is 60 to 65%. Left ventricular ejection fraction by PLAX is 56 %. The left ventricle has normal function. The left ventricle has no regional wall motion abnormalities. There is mild left ventricular hypertrophy. Left ventricular diastolic parameters were normal.  2. Right ventricular systolic function is normal. The right ventricular size is normal.  3. The mitral valve was not well visualized. No evidence of mitral valve regurgitation.  4. The aortic valve was not well visualized. Aortic valve regurgitation is not visualized.  5. The inferior vena cava is normal in size with greater than 50% respiratory variability, suggesting right atrial pressure of 3 mmHg. FINDINGS  Left Ventricle: Left ventricular ejection fraction, by estimation, is 60 to 65%. Left ventricular ejection fraction by PLAX is 56 %. The left ventricle has normal function. The left ventricle has no regional wall motion abnormalities. Definity contrast agent was given IV to delineate the left ventricular endocardial borders. The left ventricular internal cavity size was normal in size. There is mild left ventricular hypertrophy. Left ventricular diastolic parameters were normal. Right Ventricle: The right ventricular size is normal. No increase in right ventricular wall thickness. Right ventricular systolic function is normal.  Left Atrium: Left atrial size was normal in size. Right Atrium: Right atrial size was normal in size. Pericardium: There is no evidence of pericardial effusion. Mitral Valve: The mitral valve was not well visualized. No evidence of mitral valve regurgitation. Tricuspid Valve: The tricuspid valve is not well visualized. Tricuspid valve regurgitation is not demonstrated.  Hematology/Oncology Consult note Telephone:([phone] Fax:(336) [phone]      Patient Care Team: Miki Kins, FNP as PCP - General (Family Medicine)   Name of the patient: Nancy [name]  [id number]  August 17, 1965   REASON FOR COSULTATION:  Suspect ovarian cancer with malignant ascites History of presenting illness-  57 y.o. female with PMH significant for type 2 diabetes, CAD, asthma, chronic pain, diabetic foot infection, recent history of sepsis secondary to diabetic foot infection/osteomyelitis status post left BKA done on 09/24/2022, recently finished 2 weeks of IV cefazolin, presented to emergency room for evaluation of abdominal pain/bloating, poor oral intake, nonbloody nonbilious vomiting  CT abdomen pelvis showed large amount of ascites with omental caking.  Concerning for peritoneal malignancy/carcinomatosis.  No ovarian mass was visualized. 10/23/2022 patient is status post paracentesis with removal of 3.9 L of fluid, cytology came back positive for malignant cells.  IHC pending. 10/28/2022, status post repeat paracentesis due to abdominal distention and pressure caused by recurrent ascites.   Patient reports positive family history of ovarian cancer in her maternal aunt.  Patient's maternal grandmother had history of colon cancer Patient's significant other and mother are at the bedside.   Allergies  Allergen Reactions   Shellfish Allergy Anaphylaxis   Codeine Other (See Comments)    Migraine  Other Reaction(s): Other (See Comments)  Reaction:  Severe migraines   Morphine And Codeine Other (See Comments)    Reaction:  Severe migraines    Percocet [Oxycodone-Acetaminophen] Nausea And Vomiting   Rosuvastatin     Other reaction(s): Muscle Pain   Sulfa Antibiotics Rash   Theophyllines Itching and Rash    Patient Active Problem List   Diagnosis Date Noted   Ascites, possibly malignant 10/23/2022   Hypotension 10/22/2022   Intractable vomiting  10/22/2022   History of MSSA bacteremia due to osteomyelitis L foot 09/18/22 10/22/2022   S/P left  BKA 09/24/22 secondary to osteomyelitis (below knee amputation) (HCC) 10/22/2022   AKI (acute kidney injury) (HCC) 10/22/2022   Diarrhea 10/22/2022   Obesity, Class III, BMI 40-49.9 (morbid obesity) (HCC) 10/22/2022   Adjustment disorder with mixed anxiety and depressed mood 09/26/2022   MSSA bacteremia 09/20/2022   Diabetic foot infection (HCC) 09/18/2022   Osteomyelitis (HCC) 09/18/2022   Cellulitis of left lower extremity 09/18/2022   Gas gangrene (HCC) 09/18/2022   B12 deficiency due to diet 05/16/2022   Vitamin D deficiency, unspecified 05/16/2022   Diabetic ulcer of left heel associated with type 2 diabetes mellitus (HCC) 02/08/2021   Atherosclerosis of native arteries of the extremities with ulceration (HCC) 01/30/2021   Diabetes mellitus, type 2 (HCC) 03/13/2015   Sepsis secondary to diabetic foot infection 03/13/2015   Allergic rhinitis 05/10/2014   Asthma without status asthmaticus 05/10/2014   Atherosclerosis of native coronary artery of native heart with angina pectoris (HCC) 05/10/2014   Clinical depression 05/10/2014   Neuropathy, diabetic (HCC) 05/10/2014   Acid reflux 05/10/2014   Mixed hyperlipidemia 05/10/2014   Headache, migraine 05/10/2014   Obesity (BMI 30-39.9) 05/10/2014   Takotsubo cardiomyopathy 05/10/2014   Essential hypertension 08/09/2008   Heart failure (HCC) 06/06/2008     Past Medical History:  Diagnosis Date   Abnormal cardiovascular stress test 09/21/2018   Formatting of this note might be different from the original. [facility] 09/16/2018 revealed mild anterior ischemia   Adverse effect of motion 05/10/2014   Anxiety    Arthritis    r knee   Asthma    Breast cyst 05/10/2014  Hematology/Oncology Consult note Telephone:([phone] Fax:(336) [phone]      Patient Care Team: Miki Kins, FNP as PCP - General (Family Medicine)   Name of the patient: Nancy [name]  [id number]  August 17, 1965   REASON FOR COSULTATION:  Suspect ovarian cancer with malignant ascites History of presenting illness-  57 y.o. female with PMH significant for type 2 diabetes, CAD, asthma, chronic pain, diabetic foot infection, recent history of sepsis secondary to diabetic foot infection/osteomyelitis status post left BKA done on 09/24/2022, recently finished 2 weeks of IV cefazolin, presented to emergency room for evaluation of abdominal pain/bloating, poor oral intake, nonbloody nonbilious vomiting  CT abdomen pelvis showed large amount of ascites with omental caking.  Concerning for peritoneal malignancy/carcinomatosis.  No ovarian mass was visualized. 10/23/2022 patient is status post paracentesis with removal of 3.9 L of fluid, cytology came back positive for malignant cells.  IHC pending. 10/28/2022, status post repeat paracentesis due to abdominal distention and pressure caused by recurrent ascites.   Patient reports positive family history of ovarian cancer in her maternal aunt.  Patient's maternal grandmother had history of colon cancer Patient's significant other and mother are at the bedside.   Allergies  Allergen Reactions   Shellfish Allergy Anaphylaxis   Codeine Other (See Comments)    Migraine  Other Reaction(s): Other (See Comments)  Reaction:  Severe migraines   Morphine And Codeine Other (See Comments)    Reaction:  Severe migraines    Percocet [Oxycodone-Acetaminophen] Nausea And Vomiting   Rosuvastatin     Other reaction(s): Muscle Pain   Sulfa Antibiotics Rash   Theophyllines Itching and Rash    Patient Active Problem List   Diagnosis Date Noted   Ascites, possibly malignant 10/23/2022   Hypotension 10/22/2022   Intractable vomiting  10/22/2022   History of MSSA bacteremia due to osteomyelitis L foot 09/18/22 10/22/2022   S/P left  BKA 09/24/22 secondary to osteomyelitis (below knee amputation) (HCC) 10/22/2022   AKI (acute kidney injury) (HCC) 10/22/2022   Diarrhea 10/22/2022   Obesity, Class III, BMI 40-49.9 (morbid obesity) (HCC) 10/22/2022   Adjustment disorder with mixed anxiety and depressed mood 09/26/2022   MSSA bacteremia 09/20/2022   Diabetic foot infection (HCC) 09/18/2022   Osteomyelitis (HCC) 09/18/2022   Cellulitis of left lower extremity 09/18/2022   Gas gangrene (HCC) 09/18/2022   B12 deficiency due to diet 05/16/2022   Vitamin D deficiency, unspecified 05/16/2022   Diabetic ulcer of left heel associated with type 2 diabetes mellitus (HCC) 02/08/2021   Atherosclerosis of native arteries of the extremities with ulceration (HCC) 01/30/2021   Diabetes mellitus, type 2 (HCC) 03/13/2015   Sepsis secondary to diabetic foot infection 03/13/2015   Allergic rhinitis 05/10/2014   Asthma without status asthmaticus 05/10/2014   Atherosclerosis of native coronary artery of native heart with angina pectoris (HCC) 05/10/2014   Clinical depression 05/10/2014   Neuropathy, diabetic (HCC) 05/10/2014   Acid reflux 05/10/2014   Mixed hyperlipidemia 05/10/2014   Headache, migraine 05/10/2014   Obesity (BMI 30-39.9) 05/10/2014   Takotsubo cardiomyopathy 05/10/2014   Essential hypertension 08/09/2008   Heart failure (HCC) 06/06/2008     Past Medical History:  Diagnosis Date   Abnormal cardiovascular stress test 09/21/2018   Formatting of this note might be different from the original. [facility] 09/16/2018 revealed mild anterior ischemia   Adverse effect of motion 05/10/2014   Anxiety    Arthritis    r knee   Asthma    Breast cyst 05/10/2014  status post below-the-knee amputation. No evidence of fracture, dislocation, or joint effusion. No evidence of arthropathy or other focal bone abnormality. Peripheral vascular calcifications are present. Soft tissues are otherwise within normal limits. IMPRESSION: Status post below-the-knee amputation. No acute findings. Electronically Signed   By: Darliss Cheney M.D.   On: 10/01/2022 22:50   [place] EKG SITE RITE  Result Date: 09/27/2022 If Site Rite image not attached, placement could not be confirmed due to current cardiac rhythm.  ECHOCARDIOGRAM COMPLETE  Result Date: 09/22/2022    ECHOCARDIOGRAM REPORT   Patient Name:   ILCE HAVINS Date of Exam: 09/22/2022 Medical Rec #:  [id number]           Height:       69.0 in Accession #:    [id number]          Weight:       273.4 lb Date of Birth:  22-Apr-1965          BSA:          2.359 m Patient Age:    56 years            BP:           121/54 mmHg Patient Gender: F                    HR:           74 bpm. Exam Location:  [facility] Procedure: 2D Echo and Intracardiac Opacification Agent Indications:     BACTEREMIA R78.81  History:         Patient has no prior history of Echocardiogram examinations.  Sonographer:     Overton Mam RDCS, FASE Referring Phys:  [id number] Latina Craver SCHNIER Diagnosing Phys: Debbe Odea MD  Sonographer Comments: Technically challenging study due to limited acoustic windows, suboptimal parasternal window, suboptimal apical window and patient is obese. Image acquisition challenging due to patient body habitus. IMPRESSIONS  1. Left ventricular ejection fraction, by estimation, is 60 to 65%. Left ventricular ejection fraction by PLAX is 56 %. The left ventricle has normal function. The left ventricle has no regional wall motion abnormalities. There is mild left ventricular hypertrophy. Left ventricular diastolic parameters were normal.  2. Right ventricular systolic function is normal. The right ventricular size is normal.  3. The mitral valve was not well visualized. No evidence of mitral valve regurgitation.  4. The aortic valve was not well visualized. Aortic valve regurgitation is not visualized.  5. The inferior vena cava is normal in size with greater than 50% respiratory variability, suggesting right atrial pressure of 3 mmHg. FINDINGS  Left Ventricle: Left ventricular ejection fraction, by estimation, is 60 to 65%. Left ventricular ejection fraction by PLAX is 56 %. The left ventricle has normal function. The left ventricle has no regional wall motion abnormalities. Definity contrast agent was given IV to delineate the left ventricular endocardial borders. The left ventricular internal cavity size was normal in size. There is mild left ventricular hypertrophy. Left ventricular diastolic parameters were normal. Right Ventricle: The right ventricular size is normal. No increase in right ventricular wall thickness. Right ventricular systolic function is normal.  Left Atrium: Left atrial size was normal in size. Right Atrium: Right atrial size was normal in size. Pericardium: There is no evidence of pericardial effusion. Mitral Valve: The mitral valve was not well visualized. No evidence of mitral valve regurgitation. Tricuspid Valve: The tricuspid valve is not well visualized. Tricuspid valve regurgitation is not demonstrated.  Hematology/Oncology Consult note [phone] Fax:([phone]      Patient Care Team: Miki Kins, FNP as PCP - General (Family Medicine)   Name of the patient: Nancy [name]  [id number]  August 17, 1965   REASON FOR COSULTATION:  Suspect ovarian cancer with malignant ascites History of presenting illness-  57 y.o. female with PMH significant for type 2 diabetes, CAD, asthma, chronic pain, diabetic foot infection, recent history of sepsis secondary to diabetic foot infection/osteomyelitis status post left BKA done on 09/24/2022, recently finished 2 weeks of IV cefazolin, presented to emergency room for evaluation of abdominal pain/bloating, poor oral intake, nonbloody nonbilious vomiting  CT abdomen pelvis showed large amount of ascites with omental caking.  Concerning for peritoneal malignancy/carcinomatosis.  No ovarian mass was visualized. 10/23/2022 patient is status post paracentesis with removal of 3.9 L of fluid, cytology came back positive for malignant cells.  IHC pending. 10/28/2022, status post repeat paracentesis due to abdominal distention and pressure caused by recurrent ascites.   Patient reports positive family history of ovarian cancer in her maternal aunt.  Patient's maternal grandmother had history of colon cancer Patient's significant other and mother are at the bedside.   Allergies  Allergen Reactions   Shellfish Allergy Anaphylaxis   Codeine Other (See Comments)    Migraine  Other Reaction(s): Other (See Comments)  Reaction:  Severe migraines   Morphine And Codeine Other (See Comments)    Reaction:  Severe migraines    Percocet [Oxycodone-Acetaminophen] Nausea And Vomiting   Rosuvastatin     Other reaction(s): Muscle Pain   Sulfa Antibiotics Rash   Theophyllines Itching and Rash    Patient Active Problem List   Diagnosis Date Noted   Ascites, possibly malignant 10/23/2022   Hypotension 10/22/2022   Intractable vomiting  10/22/2022   History of MSSA bacteremia due to osteomyelitis L foot 09/18/22 10/22/2022   S/P left  BKA 09/24/22 secondary to osteomyelitis (below knee amputation) (HCC) 10/22/2022   AKI (acute kidney injury) (HCC) 10/22/2022   Diarrhea 10/22/2022   Obesity, Class III, BMI 40-49.9 (morbid obesity) (HCC) 10/22/2022   Adjustment disorder with mixed anxiety and depressed mood 09/26/2022   MSSA bacteremia 09/20/2022   Diabetic foot infection (HCC) 09/18/2022   Osteomyelitis (HCC) 09/18/2022   Cellulitis of left lower extremity 09/18/2022   Gas gangrene (HCC) 09/18/2022   B12 deficiency due to diet 05/16/2022   Vitamin D deficiency, unspecified 05/16/2022   Diabetic ulcer of left heel associated with type 2 diabetes mellitus (HCC) 02/08/2021   Atherosclerosis of native arteries of the extremities with ulceration (HCC) 01/30/2021   Diabetes mellitus, type 2 (HCC) 03/13/2015   Sepsis secondary to diabetic foot infection 03/13/2015   Allergic rhinitis 05/10/2014   Asthma without status asthmaticus 05/10/2014   Atherosclerosis of native coronary artery of native heart with angina pectoris (HCC) 05/10/2014   Clinical depression 05/10/2014   Neuropathy, diabetic (HCC) 05/10/2014   Acid reflux 05/10/2014   Mixed hyperlipidemia 05/10/2014   Headache, migraine 05/10/2014   Obesity (BMI 30-39.9) 05/10/2014   Takotsubo cardiomyopathy 05/10/2014   Essential hypertension 08/09/2008   Heart failure (HCC) 06/06/2008     Past Medical History:  Diagnosis Date   Abnormal cardiovascular stress test 09/21/2018   Formatting of this note might be different from the original. [facility] 09/16/2018 revealed mild anterior ischemia   Adverse effect of motion 05/10/2014   Anxiety    Arthritis    r knee   Asthma    Breast cyst 05/10/2014

## 2022-10-28 NOTE — TOC Progression Note (Signed)
Transition of Care [facility]) - Progression Note    Patient Details  Name: Nancy [name] MRN: [id number] Date of Birth: 07/07/1965  [facility] CM/SW Contact  Chapman Fitch, RN Phone Number: 10/28/2022, 2:42 PM  Clinical Narrative:      Met with patient and her mother at bedside Patient active with home health through [facility]  Therapy has upgraded recommendations to outpatient therapy  Patient in agreement and prefers [facility] in [place]   She wants to confirm first with Vivia Birmingham at vascular that she is in agreement.  Vascular confirms patient can proceed with outpatient PT.  Form signed by MD and faxed to [phone].  [place] with [facility] notified       Expected Discharge Plan and Services                                               Social Determinants of Health (SDOH) Interventions SDOH Screenings   Food Insecurity: No Food Insecurity (10/22/2022)  Housing: High Risk (10/22/2022)  Transportation Needs: No Transportation Needs (10/22/2022)  Utilities: Not At Risk (10/22/2022)  Depression (PHQ2-9): Low Risk  (10/10/2022)  Tobacco Use: Medium Risk (10/24/2022)    Readmission Risk Interventions     No data to display

## 2022-10-29 DIAGNOSIS — R180 Malignant ascites: Secondary | ICD-10-CM | POA: Diagnosis not present

## 2022-10-29 LAB — GLUCOSE, CAPILLARY
Glucose-Capillary: 131 mg/dL — ABNORMAL HIGH (ref 70–99)
Glucose-Capillary: 178 mg/dL — ABNORMAL HIGH (ref 70–99)
Glucose-Capillary: 166 mg/dL — ABNORMAL HIGH (ref 70–99)
Glucose-Capillary: 145 mg/dL — ABNORMAL HIGH (ref 70–99)
Glucose-Capillary: 161 mg/dL — ABNORMAL HIGH (ref 70–99)
Glucose-Capillary: 157 mg/dL — ABNORMAL HIGH (ref 70–99)

## 2022-10-29 LAB — CBC WITH DIFFERENTIAL/PLATELET
Abs Immature Granulocytes: 0.1 10*3/uL — ABNORMAL HIGH (ref 0.00–0.07)
Basophils Absolute: 0.1 10*3/uL (ref 0.0–0.1)
Basophils Relative: 1 %
Eosinophils Absolute: 0.5 10*3/uL (ref 0.0–0.5)
Eosinophils Relative: 5 %
HCT: 32.2 % — ABNORMAL LOW (ref 36.0–46.0)
Hemoglobin: 10 g/dL — ABNORMAL LOW (ref 12.0–15.0)
Immature Granulocytes: 1 %
Lymphocytes Relative: 29 %
Lymphs Abs: 2.7 10*3/uL (ref 0.7–4.0)
MCH: 28.5 pg (ref 26.0–34.0)
MCHC: 31.1 g/dL (ref 30.0–36.0)
MCV: 91.7 fL (ref 80.0–100.0)
Monocytes Absolute: 0.8 10*3/uL (ref 0.1–1.0)
Monocytes Relative: 8 %
Neutro Abs: 5.2 10*3/uL (ref 1.7–7.7)
Neutrophils Relative %: 56 %
Platelets: 671 10*3/uL — ABNORMAL HIGH (ref 150–400)
RBC: 3.51 MIL/uL — ABNORMAL LOW (ref 3.87–5.11)
RDW: 13.9 % (ref 11.5–15.5)
WBC: 9.4 10*3/uL (ref 4.0–10.5)
nRBC: 0 % (ref 0.0–0.2)

## 2022-10-29 LAB — BASIC METABOLIC PANEL
Anion gap: 9 (ref 5–15)
BUN: 33 mg/dL — ABNORMAL HIGH (ref 6–20)
CO2: 26 mmol/L (ref 22–32)
Calcium: 8.7 mg/dL — ABNORMAL LOW (ref 8.9–10.3)
Chloride: 98 mmol/L (ref 98–111)
Creatinine, Ser: 0.74 mg/dL (ref 0.44–1.00)
GFR, Estimated: >60 mL/min (ref >60)
Glucose, Bld: 147 mg/dL — ABNORMAL HIGH (ref 70–99)
Potassium: 4.8 mmol/L (ref 3.5–5.1)
Sodium: 133 mmol/L — ABNORMAL LOW (ref 135–145)

## 2022-10-29 MED ORDER — OXYCODONE HCL 5 MG PO TABS
10.0000 mg | ORAL_TABLET | ORAL | Status: DC | PRN
  Administered 2022-10-29: 15 mg via ORAL
  Filled 2022-10-29: qty 2
  Filled 2022-10-29: qty 3

## 2022-10-29 MED ORDER — HYDROMORPHONE HCL 2 MG PO TABS
1.0000 mg | ORAL_TABLET | ORAL | Status: DC | PRN
  Administered 2022-10-29: 2 mg via ORAL
  Administered 2022-10-30: 1 mg via ORAL
  Filled 2022-10-29 (×2): qty 1

## 2022-10-29 MED ORDER — ORAL CARE MOUTH RINSE: 15.0000 mL | OROMUCOSAL | Status: DC | PRN

## 2022-10-29 NOTE — Progress Notes (Signed)
Nutrition Follow Up Note   DOCUMENTATION CODES:   Obesity unspecified  INTERVENTION:   Ensure Max protein supplement po TID, each supplement provides 150kcal and 30g of protein.  MVI po daily   Pt remains at refeed risk; recommend monitor potassium, magnesium and phosphorus labs daily until stable  Daily weights   NUTRITION DIAGNOSIS:   Inadequate oral intake related to acute illness as evidenced by per patient/family report.  GOAL:   Patient will meet greater than or equal to 90% of their needs -progressing   MONITOR:   PO intake, Supplement acceptance, Labs, Weight trends, I & O's, Skin   ASSESSMENT:   57 y/o female with h/o HTN, asthma, HLD, cardiomyopathy, DM, CHF, depression, CAD, GERD and recent admission for MSSA bacteremia due to osteomyelitis L foot s/p L BKA 09/24/22 and who is now admitted with large volume ascites and omental caking concerning for peritoneal carcinomatosis and AKI.  -Pt s/p paracentesis 10/16 with 3.9L output  -Pt s/p paracentesis 10/21 with 3.1L output   Met with pt in room today. Pt reports that her appetite and oral intake is improving. Pt reports that she is still having some minor pain and nausea but reports that it is well managed with medications. Pt reports that she is getting tired of eating the same foods; family to bring in some different foods today. Pt reports that she has been drinking her protein supplements. No recent refeed labs; will check magnesium and phosphorus labs tomorrow. Per chart, pt appears weight stable since admission.   Medications reviewed and include: risaquad, aspirin, lovenox, insulin, iron, MVI, protonix, miralax, senokot, zinc  Labs reviewed: Na 133(L), K 4.8 wnl, BUN 33(H) Hgb 10.0(L), Hct 32.2(L) Cbgs- 166, 178, 131, 155, 164, 137, 158, 152 x 24 hrs  Diet Order:   Diet Order             Diet Carb Modified Fluid consistency: Thin  Diet effective now                  EDUCATION NEEDS:   Not  appropriate for education at this time  Skin:  Skin Assessment: Reviewed RN Assessment  Last BM:  10/21- type 6  Height:   Ht Readings from Last 1 Encounters:  10/22/22 5\' 9"  (1.753 m)    Weight:   Wt Readings from Last 1 Encounters:  10/28/22 117.4 kg   BMI:  Body mass index is 38.23 kg/m.  Estimated Nutritional Needs:   Kcal:  2400-2700kcal/day  Protein:  >120g/day  Fluid:  2.0-2.3L/day  Betsey Holiday MS, RD, LDN Please refer to [facility] for RD and/or RD on-call/weekend/after hours pager

## 2022-10-29 NOTE — Progress Notes (Signed)
PROGRESS NOTE    Nancy [name]  WUJ:[id number] DOB: Dec 14, 1965 DOA: 10/22/2022 PCP: Miki Kins, FNP    Brief Narrative:  57 y.o. female who presented with abdominal pain with CT showing large volume ascites and omental caking concerning for peritoneal carcinomatosis.  Patient has past medical history significant for type 2 diabetes, CAD, asthma, chronic pain, diabetic foot infection, GERD, hyperlipidemia, Takotsubo cardiomyopathy hospitalized from 9/11 to 09/30/2022 with sepsis secondary to diabetic foot infection/osteomyelitis s/p left BKA 9/17, also found to have Staph aureus bacteremia with TEE negative for vegetations, discharged home with 2 weeks of IV cefazolin, completed a few days ago, who presents to the ED with a 6-day history of nonbloody nonbilious vomiting Associated with generalized abdominal pain and distention.  States during her recent past hospitalization she initially presented with bleeding from her rectum but it spontaneously resolved.  She had abdominal pain and nausea but she attributed it to her foot infection.  She denies a history of unintentional weight loss or poor appetite except for when her vomiting started a week ago.   She denies cough, chest pain, shortness of breath, leg pain, black or bloody stool except for one-time episode prior to her last hospitalization and denies, dysuria.   Status post ultrasound-guided paracentesis on 10/16.  3.9 L yellow fluid removed  10/21: Initial fluid from ascites positive for malignant cells.  IHC pending.  CA125 elevated.  Indicating primary ovarian carcinoma.  Oncology involved.  Repeat paracentesis for symptom relief.  Additional 3 L fluid removed.  10/22: Continue to be pending IHC on ascites fluid.  If IHC is unrevealing may need to pursue omental biopsy.  Patient pain control slowly improving.  Attempting to avoid IV narcotic.  Assessment & Plan:   Principal Problem:   Ascites, possibly malignant Active  Problems:   Intractable vomiting   Hypotension   Essential hypertension   AKI (acute kidney injury) (HCC)   History of MSSA bacteremia due to osteomyelitis L foot 09/18/22   S/P left  BKA 09/24/22 secondary to osteomyelitis (below knee amputation) (HCC)   Diarrhea   Asthma without status asthmaticus   Clinical depression   Takotsubo cardiomyopathy   Diabetes mellitus, type 2 (HCC)   Obesity, Class III, BMI 40-49.9 (morbid obesity) (HCC)   Normocytic anemia   Malignant ascites Peritoneal caking on CT/ ?Carcinomatosis Intractable vomiting SIRS SIRS criteria include leukocytosis and hypotension with temp of 100.6, suspect related to vomiting versus infection CT showing large volume ascites with omental caking concerning for peritoneal malignancy-carcinomatosis but no mass Status post paracentesis with 3.9 L fluid removed on 10/16 No evidence of SBP Ascitic fluid culture remains negative 10/19: Discussed with pathology.  Malignant cells noted on ascites fluid.  IHC pending. 10/21: Repeat paracentesis with 3 L fluid removed Plan: Oncology consulted.  Follow IHC.  If insufficient tissue may need to pursue omental biopsy.  Optimize symptom management.  Avoid IV narcotics.   Hypotension Essential hypertension Secondary to volume depletion from intractable vomiting Blood pressure improved Coreg per home dose Amlodipine per home dose   AKI (acute kidney injury) (HCC) Prerenal/ATN from volume depletion related to intractable vomiting Creatinine 1.19 up from recent baseline of 0.59 Creatinine improving.  No indication for IV fluids   Diarrhea Resolved low suspicion for infectious diarrhea.     History of MSSA bacteremia due to osteomyelitis L foot 09/18/22 S/p left BKA 9/17 secondary to osteomyelitis Patient completed cefazolin a couple weeks prior No acute issues suspected TEE on 9/17 was  negative for vegetations   Obesity, Class III, BMI 40-49.9 (morbid obesity)  (HCC) Complicating factor to overall prognosis and care   Diabetes mellitus, type 2 (HCC) Sliding scale insulin coverage   Clinical depression Continue home venlafaxine and alprazolam   Asthma without status asthmaticus Not acutely exacerbated Continue Singulair Albuterol as needed    DVT prophylaxis: SCD Code Status: Full Family Communication: Spouse and mother at bedside 10/16, 10/17, 10/20, 10/21 Disposition Plan: Status is: Inpatient Remains inpatient appropriate because: Malignant ascites with peritoneal carcinomatosis.  Suspect ovarian primary.  IHC in progress.  May need omental biopsy if biopsy unrevealing.   Level of care: Telemetry Medical  Consultants:  Interventional radiology  Procedures:  Ultrasound-guided paracentesis  Antimicrobials:   Subjective: Seen and Ament.  Appears little bit more comfortable this morning.  Objective: Vitals:   10/28/22 1533 10/28/22 2011 10/29/22 0350 10/29/22 0838  BP: 137/78 136/62 129/68 (!) 153/64  Pulse: 73 72 68 70  Resp: 16 18 18 17   Temp: 98.6 F (37 C) 98.4 F (36.9 C) 98 F (36.7 C) 98.4 F (36.9 C)  TempSrc: Oral   Oral  SpO2: 100% 95% 95% 99%  Weight:      Height:        Intake/Output Summary (Last 24 hours) at 10/29/2022 1134 Last data filed at 10/29/2022 1040 Gross per 24 hour  Intake 240 ml  Output --  Net 240 ml    Filed Weights   10/24/22 0411 10/25/22 0500 10/28/22 1100  Weight: 117.8 kg 119.8 kg 117.4 kg    Examination:  General exam: No acute distress Respiratory system: Bibasilar crackles.  Normal work of breathing.  Room air Cardiovascular system: S1-S2, RRR, no murmurs, no pedal edema Gastrointestinal system:, Obese, soft, nontender, mild distention, normal bowel sounds Central nervous system: Alert and oriented. No focal neurological deficits. Extremities: Left lower extremity BKA Skin: No rashes, lesions or ulcers Psychiatry: Judgement and insight appear normal. Mood & affect  appropriate.     Data Reviewed: I have personally reviewed following labs and imaging studies  CBC: Recent Labs  Lab 10/22/22 1526 10/23/22 0510 10/26/22 0807 10/29/22 0452  WBC 11.7* 9.7 10.2 9.4  NEUTROABS 8.7*  --  6.3 5.2  HGB 9.8* 9.2* 10.2* 10.0*  HCT 31.4* 29.1* 32.5* 32.2*  MCV 91.3 90.4 90.5 91.7  PLT 552* 537* 689* 671*   Basic Metabolic Panel: Recent Labs  Lab 10/22/22 1526 10/23/22 0510 10/26/22 0807 10/29/22 0452  NA 133* 132* 134* 133*  K 5.1 4.8 5.5* 4.8  CL 97* 99 100 98  CO2 24 24 27 26   GLUCOSE 125* 127* 150* 147*  BUN 54* 48* 34* 33*  CREATININE 1.19* 1.13* 0.80 0.74  CALCIUM 8.8* 8.3* 8.9 8.7*   GFR: Estimated Creatinine Clearance: 106.2 mL/min (by C-G formula based on SCr of 0.74 mg/dL). Liver Function Tests: Recent Labs  Lab 10/22/22 1526 10/23/22 0510  AST 14* 13*  ALT 7 7  ALKPHOS 56 53  BILITOT 0.6 0.5  PROT 8.3* 7.3  ALBUMIN 3.0* 2.6*   Recent Labs  Lab 10/22/22 1603  LIPASE 23   No results for input(s): "AMMONIA" in the last 168 hours. Coagulation Profile: No results for input(s): "INR", "PROTIME" in the last 168 hours. Cardiac Enzymes: No results for input(s): "CKTOTAL", "CKMB", "CKMBINDEX", "TROPONINI" in the last 168 hours. BNP (last 3 results) No results for input(s): "PROBNP" in the last 8760 hours. HbA1C: No results for input(s): "HGBA1C" in the last 72 hours. CBG: Recent  Labs  Lab 10/28/22 1535 10/28/22 2017 10/28/22 2349 10/29/22 0406 10/29/22 0839  GLUCAP 137* 164* 155* 131* 178*   Lipid Profile: No results for input(s): "CHOL", "HDL", "LDLCALC", "TRIG", "CHOLHDL", "LDLDIRECT" in the last 72 hours. Thyroid Function Tests: No results for input(s): "TSH", "T4TOTAL", "FREET4", "T3FREE", "THYROIDAB" in the last 72 hours. Anemia Panel: No results for input(s): "VITAMINB12", "FOLATE", "FERRITIN", "TIBC", "IRON", "RETICCTPCT" in the last 72 hours. Sepsis Labs: Recent Labs  Lab 10/22/22 1526  PROCALCITON  0.12  LATICACIDVEN 1.0    Recent Results (from the past 240 hour(s))  Resp panel by RT-PCR (RSV, Flu A&B, Covid) Anterior Nasal Swab     Status: None   Collection Time: 10/22/22  4:03 PM   Specimen: Anterior Nasal Swab  Result Value Ref Range Status   SARS Coronavirus 2 by RT PCR NEGATIVE NEGATIVE Final    Comment: (NOTE) SARS-CoV-2 target nucleic acids are NOT DETECTED.  The SARS-CoV-2 RNA is generally detectable in upper respiratory specimens during the acute phase of infection. The lowest concentration of SARS-CoV-2 viral copies this assay can detect is 138 copies/mL. A negative result does not preclude SARS-Cov-2 infection and should not be used as the sole basis for treatment or other patient management decisions. A negative result may occur with  improper specimen collection/handling, submission of specimen other than nasopharyngeal swab, presence of viral mutation(s) within the areas targeted by this assay, and inadequate number of viral copies(<138 copies/mL). A negative result must be combined with clinical observations, patient history, and epidemiological information. The expected result is Negative.  Fact Sheet for Patients:  BloggerCourse.com  Fact Sheet for Healthcare Providers:  SeriousBroker.it  This test is no t yet approved or cleared by the [place] FDA and  has been authorized for detection and/or diagnosis of SARS-CoV-2 by FDA under an Emergency Use Authorization (EUA). This EUA will remain  in effect (meaning this test can be used) for the duration of the COVID-19 declaration under Section 564(b)(1) of the Act, 21 U.S.C.section 360bbb-3(b)(1), unless the authorization is terminated  or revoked sooner.       Influenza A by PCR NEGATIVE NEGATIVE Final   Influenza B by PCR NEGATIVE NEGATIVE Final    Comment: (NOTE) The Xpert Xpress SARS-CoV-2/FLU/RSV plus assay is intended as an aid in the diagnosis  of influenza from Nasopharyngeal swab specimens and should not be used as a sole basis for treatment. Nasal washings and aspirates are unacceptable for Xpert Xpress SARS-CoV-2/FLU/RSV testing.  Fact Sheet for Patients: BloggerCourse.com  Fact Sheet for Healthcare Providers: SeriousBroker.it  This test is not yet approved or cleared by the [place] FDA and has been authorized for detection and/or diagnosis of SARS-CoV-2 by FDA under an Emergency Use Authorization (EUA). This EUA will remain in effect (meaning this test can be used) for the duration of the COVID-19 declaration under Section 564(b)(1) of the Act, 21 U.S.C. section 360bbb-3(b)(1), unless the authorization is terminated or revoked.     Resp Syncytial Virus by PCR NEGATIVE NEGATIVE Final    Comment: (NOTE) Fact Sheet for Patients: BloggerCourse.com  Fact Sheet for Healthcare Providers: SeriousBroker.it  This test is not yet approved or cleared by the [place] FDA and has been authorized for detection and/or diagnosis of SARS-CoV-2 by FDA under an Emergency Use Authorization (EUA). This EUA will remain in effect (meaning this test can be used) for the duration of the COVID-19 declaration under Section 564(b)(1) of the Act, 21 U.S.C. section 360bbb-3(b)(1), unless the authorization is  terminated or revoked.  Performed at [facility], [address] [place], [place] [address]   Body fluid culture w Gram Stain     Status: None   Collection Time: 10/23/22  2:20 PM   Specimen: PATH Cytology Peritoneal fluid  Result Value Ref Range Status   Specimen Description   Final    PERITONEAL Performed at [facility], [address], [place], [place] [address]    Special Requests   Final    PERITONEAL Performed at [facility], [address] [place], [place] [address]    Gram Stain    Final    WBC PRESENT,BOTH PMN AND MONONUCLEAR NO ORGANISMS SEEN CYTOSPIN SMEAR    Culture   Final    NO GROWTH 3 DAYS Performed at [facility], [address] [place], [place] [address]    Report Status 10/27/2022 FINAL  Final         Radiology Studies: US Paracentesis  Result Date: 10/28/2022 INDICATION: Patient with peritoneal carcinomatosis with recurrent malignant ascites. Therapeutic paracentesis requested. EXAM: ULTRASOUND GUIDED PARACENTESIS MEDICATIONS: 1% lidocaine 10 mL COMPLICATIONS: None immediate. PROCEDURE: Informed written consent was obtained from the patient after a discussion of the risks, benefits and alternatives to treatment. A timeout was performed prior to the initiation of the procedure. Initial ultrasound scanning demonstrates a large amount of ascites within the left lower abdominal quadrant. The left lower abdomen was prepped and draped in the usual sterile fashion. 1% lidocaine was used for local anesthesia. Following this, a 19 gauge, 10-cm, Yueh catheter was introduced. An ultrasound image was saved for documentation purposes. The paracentesis was performed. The catheter was removed and a dressing was applied. The patient tolerated the procedure well without immediate post procedural complication. Patient received post-procedure intravenous albumin; see nursing notes for details. FINDINGS: A total of approximately 3.1 L of clear yellow fluid was removed. IMPRESSION: Successful ultrasound-guided paracentesis yielding 3.1 liters of peritoneal fluid. Procedure performed by Alwyn Ren NP Electronically Signed   By: Malachy Moan M.D.   On: 10/28/2022 11:04        Scheduled Meds:  acidophilus  1 capsule Oral Daily   amLODipine  5 mg Oral Daily   aspirin EC  81 mg Oral Daily   carvedilol  6.25 mg Oral BID   enoxaparin (LOVENOX) injection  0.5 mg/kg Subcutaneous Q24H   insulin aspart  0-20 Units Subcutaneous Q4H   iron polysaccharides  150 mg  Oral Daily   montelukast  10 mg Oral QHS   multivitamin with minerals  1 tablet Oral Daily   pantoprazole  40 mg Oral Daily   polyethylene glycol  17 g Oral Daily   Ensure Max Protein  11 oz Oral TID   senna-docusate  1 tablet Oral BID   venlafaxine XR  150 mg Oral QHS   zinc sulfate  220 mg Oral Daily   Continuous Infusions:  promethazine (PHENERGAN) injection (IM or IVPB)       LOS: 7 days      Tresa Moore, MD [facility]   If 7PM-7AM, please contact night-coverage  10/29/2022, 11:34 AM

## 2022-10-30 DIAGNOSIS — R180 Malignant ascites: Secondary | ICD-10-CM | POA: Diagnosis not present

## 2022-10-30 LAB — GLUCOSE, CAPILLARY
Glucose-Capillary: 138 mg/dL — ABNORMAL HIGH (ref 70–99)
Glucose-Capillary: 131 mg/dL — ABNORMAL HIGH (ref 70–99)
Glucose-Capillary: 137 mg/dL — ABNORMAL HIGH (ref 70–99)
Glucose-Capillary: 148 mg/dL — ABNORMAL HIGH (ref 70–99)

## 2022-10-30 LAB — PHOSPHORUS: Phosphorus: 4.1 mg/dL (ref 2.5–4.6)

## 2022-10-30 LAB — MAGNESIUM: Magnesium: 1.7 mg/dL (ref 1.7–2.4)

## 2022-10-30 MED ORDER — HYDROMORPHONE HCL 2 MG PO TABS: 1.0000 mg | ORAL_TABLET | ORAL | 0 refills | Status: DC | PRN

## 2022-10-30 NOTE — Discharge Summary (Signed)
Nancy [name] ION:[id number] DOB: 1965-08-13 DOA: 10/22/2022  PCP: Miki Kins, FNP  Admit date: 10/22/2022 Discharge date: 10/30/2022  Time spent: 35 minutes  Recommendations for Outpatient Follow-up:  Pcp f/u, oncology and gyn onc f/u as scheduled, vascular surgery f/u as scheduled     Discharge Diagnoses:  Principal Problem:   Malignant ascites Active Problems:   Intractable vomiting   Hypotension   Essential hypertension   AKI (acute kidney injury) (HCC)   History of MSSA bacteremia due to osteomyelitis L foot 09/18/22   S/P left  BKA 09/24/22 secondary to osteomyelitis (below knee amputation) (HCC)   Diarrhea   Asthma without status asthmaticus   Clinical depression   Takotsubo cardiomyopathy   Diabetes mellitus, type 2 (HCC)   Obesity, Class III, BMI 40-49.9 (morbid obesity) (HCC)   Normocytic anemia   Discharge Condition: stable  Diet recommendation: heart healthy  Filed Weights   10/25/22 0500 10/28/22 1100 10/30/22 0500  Weight: 119.8 kg 117.4 kg 120.3 kg    History of present illness:  From admission h and p Nancy [name] is a 57 y.o. female who presented with abdominal pain with CT showing large volume ascites and omental caking concerning for peritoneal carcinomatosis.  Patient has past medical history significant for type 2 diabetes, CAD, asthma, chronic pain, diabetic foot infection, GERD, hyperlipidemia, Takotsubo cardiomyopathy hospitalized from 9/11 to 09/30/2022 with sepsis secondary to diabetic foot infection/osteomyelitis s/p left BKA 9/17, also found to have Staph aureus bacteremia with TEE negative for vegetations, discharged home with 2 weeks of IV cefazolin, completed a few days ago, who presents to the ED with a 6-day history of nonbloody nonbilious vomiting Associated with generalized abdominal pain and distention.  States during her recent past hospitalization she initially presented with bleeding from her rectum but it  spontaneously resolved.  She had abdominal pain and nausea but she attributed it to her foot infection.  She denies a history of unintentional weight loss or poor appetite except for when her vomiting started a week ago.   She denies cough, chest pain, shortness of breath, leg pain, black or bloody stool except for one-time episode prior to her last hospitalization and denies, dysuria.   Hospital Course:  Patient presents with abdominal pain/distention, found to have malignant ascites. Received a second therapeutic paracentesis on 10/21. Oncology following, suspects gyn primary. Final path report is pending. Dr. Cathie Hoops advises outpatient f/u with her and with gyn onc both on 10/30 which are scheduled. Patient's abdomen is presently soft and without significant distention so do not think another therapeutic paracentesis is currently warranted. Pain reasonably controlled with current regimen of dilaudid, advise ongoing mgmt with oncology's palliative service. Mild aki resolved and patient tolerating a diet. PT evaluated and deemed safe for discharge. Patient has vascular surgery f/u tomorrow for her recent left BKA that appears to be healing appropriately. Other chronic medical problems stable.   Procedures: Paracentesis x2   Consultations: oncology  Discharge Exam: Vitals:   10/30/22 0420 10/30/22 0847  BP: (!) 145/55 (!) 138/58  Pulse: 68 69  Resp: 18 15  Temp: 98.5 F (36.9 C) 97.9 F (36.6 C)  SpO2: 97% 95%    General: NAD Cardiovascular: RRR Respiratory: CTAB Abdomen: soft, non-distended Ext: left BKA dressed, right leg warm without edema  Discharge Instructions   Discharge Instructions     Diet - low sodium heart healthy   Complete by: As directed    Increase activity slowly   Complete by: As  directed       Allergies as of 10/30/2022       Reactions   Shellfish Allergy Anaphylaxis   Codeine Other (See Comments)   Migraine Other Reaction(s): Other (See  Comments) Reaction:  Severe migraines   Morphine And Codeine Other (See Comments)   Reaction:  Severe migraines    Percocet [oxycodone-acetaminophen] Nausea And Vomiting   Rosuvastatin    Other reaction(s): Muscle Pain   Sulfa Antibiotics Rash   Theophyllines Itching, Rash        Medication List     STOP taking these medications    lisinopril 5 MG tablet Commonly known as: ZESTRIL       TAKE these medications    acidophilus Caps capsule Take 1 capsule by mouth daily.   albuterol 108 (90 Base) MCG/ACT inhaler Commonly known as: VENTOLIN HFA Inhale 2 puffs into the lungs every 4 (four) hours as needed for wheezing or shortness of breath. Reported on 03/29/2015   albuterol (2.5 MG/3ML) 0.083% nebulizer solution Commonly known as: PROVENTIL Take 2.5 mg by nebulization as needed.   ALPRAZolam 0.5 MG tablet Commonly known as: XANAX TAKE 1/2 TO 1 TABLET BY MOUTH AS NEEDED FOR PANIC ATTACKS   amLODipine 5 MG tablet Commonly known as: NORVASC Take 1 tablet (5 mg total) by mouth daily.   ascorbic acid 500 MG tablet Commonly known as: VITAMIN C Take 1 tablet (500 mg total) by mouth 2 (two) times daily.   aspirin EC 81 MG tablet Take 81 mg by mouth daily.   aspirin-acetaminophen-caffeine 250-250-65 MG tablet Commonly known as: EXCEDRIN MIGRAINE Take 2 tablets by mouth every 6 (six) hours as needed for migraine.   carvedilol 6.25 MG tablet Commonly known as: COREG TAKE 1 TABLET BY MOUTH TWICE A DAY   desvenlafaxine 100 MG 24 hr tablet Commonly known as: PRISTIQ TAKE 1 TABLET BY MOUTH EVERY DAY   Ensure Max Protein Liqd Take 330 mLs (11 oz total) by mouth 2 (two) times daily.   fenofibrate 54 MG tablet TAKE 1 TABLET BY MOUTH EVERY DAY   fluconazole 100 MG tablet Commonly known as: Diflucan Take one tablet by mouth on day 1 and second tablet by mouth on day 4   FreeStyle Libre 2 Sensor Misc APPLY ONE SENSOR EVERY 2 WEEKS.   HYDROmorphone 2 MG  tablet Commonly known as: DILAUDID Take 0.5 tablets (1 mg total) by mouth every 4 (four) hours as needed for severe pain (pain score 7-10) or moderate pain (pain score 4-6).   insulin NPH-regular Human (70-30) 100 UNIT/ML injection Inject 20-25 Units into the skin 2 (two) times daily with a meal.   iron polysaccharides 150 MG capsule Commonly known as: NIFEREX Take 1 capsule (150 mg total) by mouth daily.   metFORMIN 500 MG tablet Commonly known as: GLUCOPHAGE TAKE TWO TABLETS EACH MORNING. AND 3 TABLETS IN THE EVENING AS DIRECTED   methocarbamol 500 MG tablet Commonly known as: ROBAXIN Take 1 tablet (500 mg total) by mouth every 8 (eight) hours as needed for muscle spasms.   montelukast 10 MG tablet Commonly known as: SINGULAIR TAKE 1 TABLET BY MOUTH EVERYDAY AT BEDTIME   multivitamin with minerals Tabs tablet Take 1 tablet by mouth daily.   nystatin powder Commonly known as: MYCOSTATIN/NYSTOP APPLY TOPICALLY TWICE DAILY AS NEEDED   ondansetron 4 MG disintegrating tablet Commonly known as: ZOFRAN-ODT Take 4 mg by mouth every 8 (eight) hours as needed.   pantoprazole 40 MG tablet Commonly known as:  PROTONIX TAKE 1 TABLET BY MOUTH EVERY DAY   pravastatin 40 MG tablet Commonly known as: PRAVACHOL TAKE 1 TABLET BY MOUTH EVERY DAY   traMADol 50 MG tablet Commonly known as: ULTRAM Take 50 mg by mouth every 6 (six) hours as needed (for migraines).   zinc sulfate 220 (50 Zn) MG capsule Take 1 capsule (220 mg total) by mouth daily.       Allergies  Allergen Reactions   Shellfish Allergy Anaphylaxis   Codeine Other (See Comments)    Migraine  Other Reaction(s): Other (See Comments)  Reaction:  Severe migraines   Morphine And Codeine Other (See Comments)    Reaction:  Severe migraines    Percocet [Oxycodone-Acetaminophen] Nausea And Vomiting   Rosuvastatin     Other reaction(s): Muscle Pain   Sulfa Antibiotics Rash   Theophyllines Itching and Rash     Follow-up Information     Miki Kins, FNP Follow up.   Specialty: Family Medicine Contact information: [address] [place] [address] [phone]                  The results of significant diagnostics from this hospitalization (including imaging, microbiology, ancillary and laboratory) are listed below for reference.    Significant Diagnostic Studies: US Paracentesis  Result Date: 10/28/2022 INDICATION: Patient with peritoneal carcinomatosis with recurrent malignant ascites. Therapeutic paracentesis requested. EXAM: ULTRASOUND GUIDED PARACENTESIS MEDICATIONS: 1% lidocaine 10 mL COMPLICATIONS: None immediate. PROCEDURE: Informed written consent was obtained from the patient after a discussion of the risks, benefits and alternatives to treatment. A timeout was performed prior to the initiation of the procedure. Initial ultrasound scanning demonstrates a large amount of ascites within the left lower abdominal quadrant. The left lower abdomen was prepped and draped in the usual sterile fashion. 1% lidocaine was used for local anesthesia. Following this, a 19 gauge, 10-cm, Yueh catheter was introduced. An ultrasound image was saved for documentation purposes. The paracentesis was performed. The catheter was removed and a dressing was applied. The patient tolerated the procedure well without immediate post procedural complication. Patient received post-procedure intravenous albumin; see nursing notes for details. FINDINGS: A total of approximately 3.1 L of clear yellow fluid was removed. IMPRESSION: Successful ultrasound-guided paracentesis yielding 3.1 liters of peritoneal fluid. Procedure performed by Alwyn Ren NP Electronically Signed   By: Malachy Moan M.D.   On: 10/28/2022 11:04   US Paracentesis  Result Date: 10/24/2022 INDICATION: Patient presents today with possible malignant ascites. Diagnostic and therapeutic paracentesis. EXAM: ULTRASOUND GUIDED  PARACENTESIS MEDICATIONS: 1% lidocaine 10 mL COMPLICATIONS: None immediate. PROCEDURE: Informed written consent was obtained from the patient after a discussion of the risks, benefits and alternatives to treatment. A timeout was performed prior to the initiation of the procedure. Initial ultrasound scanning demonstrates a large amount of ascites within the left lower abdominal quadrant. The left lower abdomen was prepped and draped in the usual sterile fashion. 1% lidocaine was used for local anesthesia. Following this, a 19 gauge, 10-cm, Yueh catheter was introduced. An ultrasound image was saved for documentation purposes. The paracentesis was performed. The catheter was removed and a dressing was applied. The patient tolerated the procedure well without immediate post procedural complication. Patient received post-procedure intravenous albumin; see nursing notes for details. FINDINGS: A total of approximately 3.9 L of clear yellow fluid was removed. IMPRESSION: Successful ultrasound-guided paracentesis yielding 3.9 liters of peritoneal fluid. Procedure performed by Alwyn Ren NP Electronically Signed   By: Caroleen Hamman.D.  On: 10/24/2022 08:12   CT ABDOMEN PELVIS W CONTRAST  Result Date: 10/22/2022 CLINICAL DATA:  Abdominal pain, most pronounced in the left lower quadrant and suprapubic regions. Vomiting, fever. EXAM: CT ABDOMEN AND PELVIS WITH CONTRAST TECHNIQUE: Multidetector CT imaging of the abdomen and pelvis was performed using the standard protocol following bolus administration of intravenous contrast. RADIATION DOSE REDUCTION: This exam was performed according to the departmental dose-optimization program which includes automated exposure control, adjustment of the mA and/or kV according to patient size and/or use of iterative reconstruction technique. CONTRAST:  OMNIPAQUE IOHEXOL 300 MG/ML  SOLN COMPARISON:  12/12/2004 FINDINGS: Lower chest: 6 no acute abnormality Hepatobiliary: No  focal liver abnormality is seen. Status post cholecystectomy. No biliary dilatation. Pancreas: No focal abnormality or ductal dilatation. Spleen: No focal abnormality.  Normal size. Adrenals/Urinary Tract: No adrenal abnormality. No focal renal abnormality. No stones or hydronephrosis. Urinary bladder is unremarkable. Stomach/Bowel: Stomach, large and small bowel grossly unremarkable. Vascular/Lymphatic: Aortic atherosclerosis. No evidence of aneurysm or adenopathy. Reproductive: Uterus and adnexa unremarkable.  No mass. Other: Large volume ascites in the abdomen and pelvis. Omental thickening/caking noted anteriorly. Appearance is concerning for peritoneal carcinomatosis, often seen with ovarian cancer. However, no ovarian mass visualized. Musculoskeletal: No acute bony abnormality. IMPRESSION: Large volume ascites in the abdomen or pelvis with omental caking. Appearance is concerning for peritoneal malignancy/carcinomatosis, often seen with ovarian cancer, but no ovarian mass visualized. Aortic atherosclerosis. Electronically Signed   By: Charlett Nose M.D.   On: 10/22/2022 19:41   DG Knee Complete 4 Views Left  Result Date: 10/01/2022 CLINICAL DATA:  Pain EXAM: LEFT KNEE - COMPLETE 4+ VIEW COMPARISON:  None Available. FINDINGS: Patient is status post below-the-knee amputation. No evidence of fracture, dislocation, or joint effusion. No evidence of arthropathy or other focal bone abnormality. Peripheral vascular calcifications are present. Soft tissues are otherwise within normal limits. IMPRESSION: Status post below-the-knee amputation. No acute findings. Electronically Signed   By: Darliss Cheney M.D.   On: 10/01/2022 22:50    Microbiology: Recent Results (from the past 240 hour(s))  Resp panel by RT-PCR (RSV, Flu A&B, Covid) Anterior Nasal Swab     Status: None   Collection Time: 10/22/22  4:03 PM   Specimen: Anterior Nasal Swab  Result Value Ref Range Status   SARS Coronavirus 2 by RT PCR NEGATIVE  NEGATIVE Final    Comment: (NOTE) SARS-CoV-2 target nucleic acids are NOT DETECTED.  The SARS-CoV-2 RNA is generally detectable in upper respiratory specimens during the acute phase of infection. The lowest concentration of SARS-CoV-2 viral copies this assay can detect is 138 copies/mL. A negative result does not preclude SARS-Cov-2 infection and should not be used as the sole basis for treatment or other patient management decisions. A negative result may occur with  improper specimen collection/handling, submission of specimen other than nasopharyngeal swab, presence of viral mutation(s) within the areas targeted by this assay, and inadequate number of viral copies(<138 copies/mL). A negative result must be combined with clinical observations, patient history, and epidemiological information. The expected result is Negative.  Fact Sheet for Patients:  BloggerCourse.com  Fact Sheet for Healthcare Providers:  SeriousBroker.it  This test is no t yet approved or cleared by the [place] FDA and  has been authorized for detection and/or diagnosis of SARS-CoV-2 by FDA under an Emergency Use Authorization (EUA). This EUA will remain  in effect (meaning this test can be used) for the duration of the COVID-19 declaration under Section 564(b)(1) of  the Act, 21 U.S.C.section 360bbb-3(b)(1), unless the authorization is terminated  or revoked sooner.       Influenza A by PCR NEGATIVE NEGATIVE Final   Influenza B by PCR NEGATIVE NEGATIVE Final    Comment: (NOTE) The Xpert Xpress SARS-CoV-2/FLU/RSV plus assay is intended as an aid in the diagnosis of influenza from Nasopharyngeal swab specimens and should not be used as a sole basis for treatment. Nasal washings and aspirates are unacceptable for Xpert Xpress SARS-CoV-2/FLU/RSV testing.  Fact Sheet for Patients: BloggerCourse.com  Fact Sheet for Healthcare  Providers: SeriousBroker.it  This test is not yet approved or cleared by the [place] FDA and has been authorized for detection and/or diagnosis of SARS-CoV-2 by FDA under an Emergency Use Authorization (EUA). This EUA will remain in effect (meaning this test can be used) for the duration of the COVID-19 declaration under Section 564(b)(1) of the Act, 21 U.S.C. section 360bbb-3(b)(1), unless the authorization is terminated or revoked.     Resp Syncytial Virus by PCR NEGATIVE NEGATIVE Final    Comment: (NOTE) Fact Sheet for Patients: BloggerCourse.com  Fact Sheet for Healthcare Providers: SeriousBroker.it  This test is not yet approved or cleared by the [place] FDA and has been authorized for detection and/or diagnosis of SARS-CoV-2 by FDA under an Emergency Use Authorization (EUA). This EUA will remain in effect (meaning this test can be used) for the duration of the COVID-19 declaration under Section 564(b)(1) of the Act, 21 U.S.C. section 360bbb-3(b)(1), unless the authorization is terminated or revoked.  Performed at [facility], [address] [place], [place] [address]   Body fluid culture w Gram Stain     Status: None   Collection Time: 10/23/22  2:20 PM   Specimen: PATH Cytology Peritoneal fluid  Result Value Ref Range Status   Specimen Description   Final    PERITONEAL Performed at [facility], [address] [place], [place] [address]    Special Requests   Final    PERITONEAL Performed at [facility], [address] [place], [place] [address]    Gram Stain   Final    WBC PRESENT,BOTH PMN AND MONONUCLEAR NO ORGANISMS SEEN CYTOSPIN SMEAR    Culture   Final    NO GROWTH 3 DAYS Performed at [facility], [address] [place], [place] [address]    Report Status 10/27/2022 FINAL  Final     Labs: Basic Metabolic Panel: Recent Labs   Lab 10/26/22 0807 10/29/22 0452 10/30/22 0454  NA 134* 133*  --   K 5.5* 4.8  --   CL 100 98  --   CO2 27 26  --   GLUCOSE 150* 147*  --   BUN 34* 33*  --   CREATININE 0.80 0.74  --   CALCIUM 8.9 8.7*  --   MG  --   --  1.7  PHOS  --   --  4.1   Liver Function Tests: No results for input(s): "AST", "ALT", "ALKPHOS", "BILITOT", "PROT", "ALBUMIN" in the last 168 hours. No results for input(s): "LIPASE", "AMYLASE" in the last 168 hours. No results for input(s): "AMMONIA" in the last 168 hours. CBC: Recent Labs  Lab 10/26/22 0807 10/29/22 0452  WBC 10.2 9.4  NEUTROABS 6.3 5.2  HGB 10.2* 10.0*  HCT 32.5* 32.2*  MCV 90.5 91.7  PLT 689* 671*   Cardiac Enzymes: No results for input(s): "CKTOTAL", "CKMB", "CKMBINDEX", "TROPONINI" in the last 168 hours. BNP: BNP (  last 3 results) Recent Labs    10/23/22 0510  BNP 42.8    ProBNP (last 3 results) No results for input(s): "PROBNP" in the last 8760 hours.  CBG: Recent Labs  Lab 10/29/22 2038 10/30/22 0137 10/30/22 0413 10/30/22 0848 10/30/22 1137  GLUCAP 157* 138* 131* 137* 148*       Signed:  Silvano Bilis MD.  [facility] 10/30/2022, 1:21 PM

## 2022-10-30 NOTE — TOC Transition Note (Signed)
Transition of Care [facility]) - CM/SW Discharge Note   Patient Details  Name: Nancy [name] MRN: [id number] Date of Birth: [birth year]  [facility]) CM/SW Contact:  Chapman Fitch, RN Phone Number: 10/30/2022, 1:50 PM   Clinical Narrative:      Patient to discharge today   Wife to transport at discharge  Outpatient therapy referral has been faxed per patient's request.  See previous notes     Patient Goals and CMS Choice      Discharge Placement                         Discharge Plan and Services Additional resources added to the After Visit Summary for                                       Social Determinants of Health (SDOH) Interventions SDOH Screenings   Food Insecurity: No Food Insecurity (10/22/2022)  Housing: High Risk (10/22/2022)  Transportation Needs: No Transportation Needs (10/22/2022)  Utilities: Not At Risk (10/22/2022)  Depression (PHQ2-9): Low Risk  (10/10/2022)  Tobacco Use: Medium Risk (10/24/2022)     Readmission Risk Interventions    10/30/2022    1:50 PM  Readmission Risk Prevention Plan  Transportation Screening Complete  PCP or Specialist Appt within 3-5 Days Complete  HRI or Home Care Consult --  Social Work Consult for Recovery Care Planning/Counseling Complete  Medication Review Ocean[facility]) Complete

## 2022-10-30 NOTE — Plan of Care (Signed)
  Problem: Respiratory: Goal: Ability to maintain adequate ventilation will improve Outcome: Progressing   Problem: Education: Goal: Ability to describe self-care measures that may prevent or decrease complications (Diabetes Survival Skills Education) will improve Outcome: Progressing   Problem: Coping: Goal: Ability to adjust to condition or change in health will improve Outcome: Progressing   Problem: Fluid Volume: Goal: Ability to maintain a balanced intake and output will improve Outcome: Progressing   Problem: Health Behavior/Discharge Planning: Goal: Ability to identify and utilize available resources and services will improve Outcome: Progressing Goal: Ability to manage health-related needs will improve Outcome: Progressing   Problem: Nutritional: Goal: Maintenance of adequate nutrition will improve Outcome: Progressing   Problem: Education: Goal: Knowledge of General Education information will improve Description: Including pain rating scale, medication(s)/side effects and non-pharmacologic comfort measures Outcome: Progressing   Problem: Health Behavior/Discharge Planning: Goal: Ability to manage health-related needs will improve Outcome: Progressing   Problem: Clinical Measurements: Goal: Will remain free from infection Outcome: Progressing Goal: Respiratory complications will improve Outcome: Progressing

## 2022-10-30 NOTE — Progress Notes (Signed)
Mobility Specialist - Progress Note     10/30/22 0906  Mobility  Activity Ambulated with assistance in room  Level of Assistance Standby assist, set-up cues, supervision of patient - no hands on  Assistive Device Front wheel walker  Distance Ambulated (ft) 10 ft  Range of Motion/Exercises Active  Activity Response Tolerated well  Mobility Referral Yes  $Mobility charge 1 Mobility  Mobility Specialist Start Time (ACUTE ONLY) [id number]  Mobility Specialist Stop Time (ACUTE ONLY) 0907  Mobility Specialist Time Calculation (min) (ACUTE ONLY) 15 min   Pt resting in bed on RA upon entry. Pt STS bearing weight on right side due to left BKA SBA with RW to maintain steady hop to gait. Pt ambulates to door and back to bed. Pt returned to bed and left with needs in reach.   Johnathan Hausen Mobility Specialist 10/30/22, 9:17 AM

## 2022-10-31 ENCOUNTER — Encounter (INDEPENDENT_AMBULATORY_CARE_PROVIDER_SITE_OTHER): Payer: Self-pay | Admitting: Nurse Practitioner

## 2022-10-31 ENCOUNTER — Ambulatory Visit (INDEPENDENT_AMBULATORY_CARE_PROVIDER_SITE_OTHER): Payer: 59 | Admitting: Nurse Practitioner

## 2022-10-31 ENCOUNTER — Ambulatory Visit: Payer: 59 | Admitting: Physical Medicine and Rehabilitation

## 2022-10-31 VITALS — BP 134/86 | HR 75 | Resp 18 | Ht 69.0 in | Wt 265.0 lb

## 2022-10-31 DIAGNOSIS — Z89612 Acquired absence of left leg above knee: Secondary | ICD-10-CM

## 2022-11-01 NOTE — Consult Note (Signed)
[facility] Customer service manager [facility]) Accountable Care Organization (ACO) [facility] Liaison Note  11/01/2022  Nancy [name] 06-03-1965 [id number]  Location: [facility] RN Hospital Liaison screened the patient remotely at [facility].  Insurance: [employer] [name] is a 57 y.o. female who is a Primary Care Patient of Miki Kins, FNP. The patient was screened for 30 day readmission hospitalization with noted high risk score for unplanned readmission risk with 2 IP/1 ED in 6 months.  The patient was assessed for potential [facility]) Care Management service needs for post hospital transition for care coordination. Review of patient's electronic medical record reveals patient was admitted with generalized abdominal pain. Pt discharged with outpatient therapy. No additional anticipated needs for care management services at this time.  Plan: [facility] Liaison will continue to follow progress and disposition to asess for post hospital community care coordination/management needs.  Referral request for community care coordination: anticipate [facility] Transitions of Care Team follow up.   [facility] Care Management/Population Health does not replace or interfere with any arrangements made by the Inpatient Transition of Care team.   For questions contact:   [name], RN, [facility] Liaison [place]   [facility], Population Health Office Hours MTWF  8:00 am-6:00 pm Direct Dial: [phone] mobile [phone] [Office toll free line] Office Hours are M-F 8:30 - 5 pm [email or web address]

## 2022-11-05 ENCOUNTER — Other Ambulatory Visit: Payer: Self-pay

## 2022-11-05 ENCOUNTER — Ambulatory Visit (INDEPENDENT_AMBULATORY_CARE_PROVIDER_SITE_OTHER): Payer: 59 | Admitting: Family

## 2022-11-05 ENCOUNTER — Encounter: Payer: Self-pay | Admitting: Family

## 2022-11-05 VITALS — BP 158/68 | HR 61 | Ht 69.0 in | Wt 258.0 lb

## 2022-11-05 DIAGNOSIS — F331 Major depressive disorder, recurrent, moderate: Secondary | ICD-10-CM

## 2022-11-05 DIAGNOSIS — E66813 Obesity, class 3: Secondary | ICD-10-CM

## 2022-11-05 DIAGNOSIS — Z89512 Acquired absence of left leg below knee: Secondary | ICD-10-CM

## 2022-11-05 DIAGNOSIS — C786 Secondary malignant neoplasm of retroperitoneum and peritoneum: Secondary | ICD-10-CM

## 2022-11-05 DIAGNOSIS — F4323 Adjustment disorder with mixed anxiety and depressed mood: Secondary | ICD-10-CM

## 2022-11-05 MED ORDER — ONDANSETRON 8 MG PO TBDP: 8.0000 mg | ORAL_TABLET | Freq: Three times a day (TID) | ORAL | 3 refills | Status: AC | PRN

## 2022-11-05 MED ORDER — FLUCONAZOLE 200 MG PO TABS: 200.0000 mg | ORAL_TABLET | Freq: Every day | ORAL | 3 refills | Status: DC

## 2022-11-05 MED ORDER — FREESTYLE LIBRE 2 SENSOR MISC: 1.0000 | 3 refills | Status: AC

## 2022-11-05 MED ORDER — NYSTATIN 100000 UNIT/GM EX OINT: 1.0000 | TOPICAL_OINTMENT | Freq: Two times a day (BID) | CUTANEOUS | 0 refills | Status: AC

## 2022-11-05 NOTE — Progress Notes (Signed)
Oley Balm, MD sent to [facility] PROCEDURE / BIOPSY REVIEW Date: 11/05/22  Requested Biopsy site: omentum Reason for request: cyto inadequate Imaging review: Best seen on CT 10/22/22  Decision: Approved Imaging modality to perform: CT Schedule with: Moderate Sedation Schedule for: Any VIR  Additional comments: @VIR : +ascites, consider preprocedure para  Please contact me with questions, concerns, or if issue pertaining to this request arise.  Dayne Oley Balm, MD [facility]

## 2022-11-06 ENCOUNTER — Encounter: Payer: Self-pay | Admitting: Oncology

## 2022-11-06 ENCOUNTER — Encounter: Payer: Self-pay | Admitting: Obstetrics and Gynecology

## 2022-11-06 ENCOUNTER — Inpatient Hospital Stay (HOSPITAL_BASED_OUTPATIENT_CLINIC_OR_DEPARTMENT_OTHER): Payer: 59 | Admitting: Oncology

## 2022-11-06 ENCOUNTER — Inpatient Hospital Stay: Payer: 59 | Attending: Oncology | Admitting: Obstetrics and Gynecology

## 2022-11-06 VITALS — BP 149/51 | HR 92 | Temp 97.6°F | Resp 20 | Wt 258.7 lb

## 2022-11-06 DIAGNOSIS — Z794 Long term (current) use of insulin: Secondary | ICD-10-CM

## 2022-11-06 DIAGNOSIS — K219 Gastro-esophageal reflux disease without esophagitis: Secondary | ICD-10-CM | POA: Insufficient documentation

## 2022-11-06 DIAGNOSIS — D509 Iron deficiency anemia, unspecified: Secondary | ICD-10-CM | POA: Insufficient documentation

## 2022-11-06 DIAGNOSIS — B9561 Methicillin susceptible Staphylococcus aureus infection as the cause of diseases classified elsewhere: Secondary | ICD-10-CM | POA: Insufficient documentation

## 2022-11-06 DIAGNOSIS — Z800 Family history of malignant neoplasm of digestive organs: Secondary | ICD-10-CM | POA: Diagnosis not present

## 2022-11-06 DIAGNOSIS — C482 Malignant neoplasm of peritoneum, unspecified: Secondary | ICD-10-CM | POA: Diagnosis not present

## 2022-11-06 DIAGNOSIS — R140 Abdominal distension (gaseous): Secondary | ICD-10-CM | POA: Insufficient documentation

## 2022-11-06 DIAGNOSIS — J45909 Unspecified asthma, uncomplicated: Secondary | ICD-10-CM | POA: Diagnosis not present

## 2022-11-06 DIAGNOSIS — Z808 Family history of malignant neoplasm of other organs or systems: Secondary | ICD-10-CM | POA: Diagnosis not present

## 2022-11-06 DIAGNOSIS — Z79899 Other long term (current) drug therapy: Secondary | ICD-10-CM | POA: Diagnosis not present

## 2022-11-06 DIAGNOSIS — R7881 Bacteremia: Secondary | ICD-10-CM | POA: Insufficient documentation

## 2022-11-06 DIAGNOSIS — E440 Moderate protein-calorie malnutrition: Secondary | ICD-10-CM

## 2022-11-06 DIAGNOSIS — E785 Hyperlipidemia, unspecified: Secondary | ICD-10-CM | POA: Insufficient documentation

## 2022-11-06 DIAGNOSIS — E46 Unspecified protein-calorie malnutrition: Secondary | ICD-10-CM | POA: Insufficient documentation

## 2022-11-06 DIAGNOSIS — Z7189 Other specified counseling: Secondary | ICD-10-CM | POA: Diagnosis not present

## 2022-11-06 DIAGNOSIS — Z87891 Personal history of nicotine dependence: Secondary | ICD-10-CM | POA: Insufficient documentation

## 2022-11-06 DIAGNOSIS — E119 Type 2 diabetes mellitus without complications: Secondary | ICD-10-CM | POA: Diagnosis not present

## 2022-11-06 DIAGNOSIS — Z8041 Family history of malignant neoplasm of ovary: Secondary | ICD-10-CM | POA: Insufficient documentation

## 2022-11-06 DIAGNOSIS — C801 Malignant (primary) neoplasm, unspecified: Secondary | ICD-10-CM | POA: Insufficient documentation

## 2022-11-06 DIAGNOSIS — I2510 Atherosclerotic heart disease of native coronary artery without angina pectoris: Secondary | ICD-10-CM | POA: Insufficient documentation

## 2022-11-06 DIAGNOSIS — Z806 Family history of leukemia: Secondary | ICD-10-CM | POA: Diagnosis not present

## 2022-11-06 DIAGNOSIS — E1165 Type 2 diabetes mellitus with hyperglycemia: Secondary | ICD-10-CM | POA: Diagnosis not present

## 2022-11-06 DIAGNOSIS — D508 Other iron deficiency anemias: Secondary | ICD-10-CM

## 2022-11-06 DIAGNOSIS — F419 Anxiety disorder, unspecified: Secondary | ICD-10-CM | POA: Insufficient documentation

## 2022-11-06 DIAGNOSIS — R180 Malignant ascites: Secondary | ICD-10-CM | POA: Insufficient documentation

## 2022-11-06 DIAGNOSIS — Z7982 Long term (current) use of aspirin: Secondary | ICD-10-CM | POA: Diagnosis not present

## 2022-11-06 DIAGNOSIS — C569 Malignant neoplasm of unspecified ovary: Secondary | ICD-10-CM | POA: Insufficient documentation

## 2022-11-06 DIAGNOSIS — Z801 Family history of malignant neoplasm of trachea, bronchus and lung: Secondary | ICD-10-CM | POA: Diagnosis not present

## 2022-11-06 DIAGNOSIS — Z6838 Body mass index (BMI) 38.0-38.9, adult: Secondary | ICD-10-CM | POA: Diagnosis not present

## 2022-11-06 DIAGNOSIS — Z89512 Acquired absence of left leg below knee: Secondary | ICD-10-CM | POA: Diagnosis not present

## 2022-11-06 MED ORDER — DEXAMETHASONE 4 MG PO TABS: ORAL_TABLET | ORAL | 1 refills | Status: DC

## 2022-11-06 MED ORDER — ONDANSETRON HCL 8 MG PO TABS: 8.0000 mg | ORAL_TABLET | Freq: Three times a day (TID) | ORAL | 1 refills | Status: AC | PRN

## 2022-11-06 MED ORDER — PROCHLORPERAZINE MALEATE 10 MG PO TABS: 10.0000 mg | ORAL_TABLET | Freq: Four times a day (QID) | ORAL | 1 refills | Status: DC | PRN

## 2022-11-06 MED ORDER — LIDOCAINE-PRILOCAINE 2.5-2.5 % EX CREA: TOPICAL_CREAM | CUTANEOUS | 3 refills | Status: AC

## 2022-11-06 NOTE — Assessment & Plan Note (Signed)
Refer to establish care with nutritionist.

## 2022-11-06 NOTE — Progress Notes (Signed)
Gynecologic Oncology Consult Visit   Referring Provider:  Dr. Cathie Hoops  Chief Concern: malignant ascites concerning for primary peritoneal cancer  Subjective:  Nancy [name] is a 57 y.o. G0 female who is seen in consultation from Dr. 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  Discharged from [facility] last week.    "Nancy [name] is a 57 y.o. female who presented with abdominal pain with CT showing large volume ascites and omental caking concerning for peritoneal carcinomatosis.  Patient has past medical history significant for type 2 diabetes, CAD, asthma, chronic pain, diabetic foot infection, GERD, hyperlipidemia, Takotsubo cardiomyopathy hospitalized from 9/11 to 09/30/2022 with sepsis secondary to diabetic foot infection/osteomyelitis s/p left BKA 9/17, also found to have Staph aureus bacteremia with TEE negative for vegetations, discharged home with 2 weeks of IV cefazolin, completed a few days ago, who presents to the ED with a 6-day history of nonbloody nonbilious vomiting Associated with generalized abdominal pain and distention.  States during her recent past hospitalization she initially presented with bleeding from her rectum but it spontaneously resolved.  She had abdominal pain and nausea but she attributed it to her foot infection.  She denies cough, chest pain, shortness of breath, leg pain, black or bloody stool except for one-time episode prior to her last hospitalization and denies, dysuria.  Hospital Course: Patient presents with abdominal pain/distention, found to have malignant ascites. Received a second therapeutic paracentesis on 10/21. Oncology following, suspects gyn primary. Final path report adenocarcinoma, but insufficient material for IHC studies. Mild aki resolved and patient tolerating a diet. PT evaluated and deemed safe for discharge."  10/26/22 CA125 =762, CEA and CA19-9 normal  Maternal aunt with ovarian cancer.  No breast cancer  in the family.  Mammogram normal 2021, no prior colonoscopy. Had an episode of rectal bleeding, but none now.  Problem List: Patient Active Problem List   Diagnosis Date Noted   Normocytic anemia 10/28/2022   Malignant ascites 10/23/2022   Hypotension 10/22/2022   Intractable vomiting 10/22/2022   History of MSSA bacteremia due to osteomyelitis L foot 09/18/22 10/22/2022   S/P left  BKA 09/24/22 secondary to osteomyelitis (below knee amputation) (HCC) 10/22/2022   AKI (acute kidney injury) (HCC) 10/22/2022   Diarrhea 10/22/2022   Obesity, Class III, BMI 40-49.9 (morbid obesity) (HCC) 10/22/2022   Adjustment disorder with mixed anxiety and depressed mood 09/26/2022   MSSA bacteremia 09/20/2022   Diabetic foot infection (HCC) 09/18/2022   Osteomyelitis (HCC) 09/18/2022   Cellulitis of left lower extremity 09/18/2022   Gas gangrene (HCC) 09/18/2022   B12 deficiency due to diet 05/16/2022   Vitamin D deficiency, unspecified 05/16/2022   Diabetic ulcer of left heel associated with type 2 diabetes mellitus (HCC) 02/08/2021   Atherosclerosis of native arteries of the extremities with ulceration (HCC) 01/30/2021   Diabetes mellitus, type 2 (HCC) 03/13/2015   Sepsis secondary to diabetic foot infection 03/13/2015   Allergic rhinitis 05/10/2014   Asthma without status asthmaticus 05/10/2014   Atherosclerosis of native coronary artery of native heart with angina pectoris (HCC) 05/10/2014   Clinical depression 05/10/2014   Neuropathy, diabetic (HCC) 05/10/2014   Acid reflux 05/10/2014   Mixed hyperlipidemia 05/10/2014   Headache, migraine 05/10/2014   Obesity (BMI 30-39.9) 05/10/2014   Takotsubo cardiomyopathy 05/10/2014   Essential hypertension 08/09/2008   Heart failure (HCC) 06/06/2008    Past Medical History: Past Medical History:  Diagnosis Date   Abnormal cardiovascular stress test 09/21/2018   Formatting of this  note might be different from the original. Lexiscan Myoview  09/16/2018 revealed mild anterior ischemia   Adverse effect of motion 05/10/2014   Anxiety    Arthritis    r knee   Asthma    Breast cyst 05/10/2014   CAD (coronary artery disease)    a.) LHC 06/04/2009 at [facility]; non-obstructive CAD. b.) CTA with FFR 10/08/2018: extensive mixed plaque proximal to mid LAD (51-69%); Coronary Ca score 217; FFR 0.71 dPDA, 0.86 mLAD, 0.87 dLCx.   CCF (congestive cardiac failure) (HCC) 06/06/2008   a.) 30% EF. b.) TTE 06/03/2011: EF >55%; triv MR. c.) TTE 04/27/2018: EF 55%; mild LVH; triv PR, mild MR/TR; G1DD.   Chest pain with high risk for cardiac etiology 07/02/2016   Chronic use of opiate drug for therapeutic purpose 02/13/2022   Formatting of this note might be different from the original. [facility] Pain Contract signed on 04/18/16 & updated 03/04/17; UDS done on 04/18/16.   Complication of anesthesia    Diabetic foot infection (HCC) 03/13/2015   Diabetic foot ulcer (HCC) 02/08/2021   Eczema    Family history of adverse reaction to anesthesia    a.) PONV in mother and grandmother   GERD (gastroesophageal reflux disease) 05/10/2014   History of kidney stones    HLD (hyperlipidemia)    Hyponatremia 03/13/2015   Migraines    Motion sickness    Other acute osteomyelitis, left ankle and foot (HCC) 02/13/2022   Panic attacks    Peripheral edema 04/27/2018   Pneumonia    PONV (postoperative nausea and vomiting)    Sepsis secondary to diabetic foot infection 03/13/2015   T2DM (type 2 diabetes mellitus) (HCC)    Takotsubo cardiomyopathy / transient apical balooning syndrome / stress-induced cardiomyopathy 05/10/2014   Unspecified essential hypertension     Past Surgical History: Past Surgical History:  Procedure Laterality Date   AMPUTATION Left 09/19/2022   Procedure: AMPUTATION BELOW KNEE;  Surgeon: Renford Dills, MD;  Location: [facility];  Service: Vascular;  Laterality: Left;   AMPUTATION Left 09/24/2022   Procedure: AMPUTATION BELOW KNEE WITH WOUND  CLOSURE;  Surgeon: Renford Dills, MD;  Location: [facility];  Service: Vascular;  Laterality: Left;   AMPUTATION TOE Left 03/16/2015   Procedure: left fifth toe amputation with incision and drainage;  Surgeon: Gwyneth Revels, DPM;  Location: [facility];  Service: Podiatry;  Laterality: Left;   APPLICATION OF WOUND VAC Left 02/14/2021   Procedure: APPLICATION OF WOUND VAC;  Surgeon: Gwyneth Revels, DPM;  Location: [facility];  Service: Podiatry;  Laterality: Left;   APPLICATION OF WOUND VAC Left 04/16/2021   Procedure: APPLICATION OF WOUND VAC;  Surgeon: Peggye Form, DO;  Location: [facility];  Service: Plastics;  Laterality: Left;   CARDIAC CATHETERIZATION Left 06/04/2009   Procedure: CARDIAC CATHETERIZATION; Location: [facility]   CHOLECYSTECTOMY     DEBRIDEMENT AND CLOSURE WOUND Left 04/16/2021   Procedure: DEBRIDEMENT AND CLOSURE WOUND;  Surgeon: Peggye Form, DO;  Location: [facility];  Service: Plastics;  Laterality: Left;  1 hour   IRRIGATION AND DEBRIDEMENT FOOT Left 02/08/2021   Procedure: IRRIGATION AND DEBRIDEMENT FOOT - LFT HEEL ULCER;  Surgeon: Gwyneth Revels, DPM;  Location: [facility];  Service: Podiatry;  Laterality: Left;   IRRIGATION AND DEBRIDEMENT FOOT Left 02/14/2021   Procedure: IRRIGATION AND DEBRIDEMENT LEFT HEEL;  Surgeon: Gwyneth Revels, DPM;  Location: [facility];  Service: Podiatry;  Laterality: Left;   kidney stone removal  KNEE ARTHROSCOPY W/ MENISCAL REPAIR     NOSE SURGERY  01/07/1989   due to fracture   TEE WITHOUT CARDIOVERSION N/A 09/26/2022   Procedure: TRANSESOPHAGEAL ECHOCARDIOGRAM;  Surgeon: Clotilde Dieter, DO;  Location: [facility];  Service: Cardiovascular;  Laterality: N/A;   TOE AMPUTATION     second toe   TONSILLECTOMY     TOTAL KNEE ARTHROPLASTY  01/11/2011   Procedure: TOTAL KNEE ARTHROPLASTY;  Surgeon: Harvie Junior;  Location: [facility];  Service: Orthopedics;  Laterality: Right;  COMPUTER ASSISTED  TOTAL KNEE REPLACEMENT       OB History:  OB History  Gravida Para Term Preterm AB Living  0 0 0 0 0 0  SAB IAB Ectopic Multiple Live Births  0 0 0 0 0    Family History: Family History  Problem Relation Age of Onset   Transient ischemic attack Mother    Asthma Mother    Hypertension Mother    Lung cancer Father    Hypertension Father    Asthma Sister    Anxiety disorder Sister    Stomach cancer Maternal Grandmother    Colon cancer Maternal Grandmother    Leukemia Maternal Grandmother    Melanoma Maternal Grandfather    Asthma Sister    Hypertension Brother    Cancer Other    Crohn's disease Other    GER disease Other    Breast cancer Neg Hx     Social History: Social History   Socioeconomic History   Marital status: Married    Spouse name: Not on file   Number of children: Not on file   Years of education: Not on file   Highest education level: Not on file  Occupational History   Not on file  Tobacco Use   Smoking status: Former    Current packs/day: 0.00    Types: Cigarettes    Quit date: 03/12/1985    Years since quitting: 37.6   Smokeless tobacco: Never  Vaping Use   Vaping status: Never Used  Substance and Sexual Activity   Alcohol use: No   Drug use: No   Sexual activity: Yes    Birth control/protection: Post-menopausal  Other Topics Concern   Not on file  Social History Narrative   Full time. Does not regularly exercise. Domestic partner.    Social Determinants of Health   Financial Resource Strain: Not on file  Food Insecurity: No Food Insecurity (10/22/2022)   Hunger Vital Sign    Worried About Running Out of Food in the Last Year: Never true    Ran Out of Food in the Last Year: Never true  Transportation Needs: No Transportation Needs (10/22/2022)   PRAPARE - Administrator, Civil Service (Medical): No    Lack of Transportation (Non-Medical): No  Physical Activity: Not on file  Stress: Not on file  Social Connections:  Not on file  Intimate Partner Violence: Not At Risk (10/22/2022)   Humiliation, Afraid, Rape, and Kick questionnaire    Fear of Current or Ex-Partner: No    Emotionally Abused: No    Physically Abused: No    Sexually Abused: No    Allergies: Allergies  Allergen Reactions   Shellfish Allergy Anaphylaxis   Codeine Other (See Comments)    Migraine  Other Reaction(s): Other (See Comments)  Reaction:  Severe migraines   Morphine And Codeine Other (See Comments)    Reaction:  Severe migraines    Percocet [Oxycodone-Acetaminophen] Nausea And Vomiting   Rosuvastatin  Other reaction(s): Muscle Pain   Sulfa Antibiotics Rash   Theophyllines Itching and Rash    Current Medications: Current Outpatient Medications  Medication Sig Dispense Refill   acidophilus (RISAQUAD) CAPS capsule Take 1 capsule by mouth daily.     albuterol (PROVENTIL HFA;VENTOLIN HFA) 108 (90 Base) MCG/ACT inhaler Inhale 2 puffs into the lungs every 4 (four) hours as needed for wheezing or shortness of breath. Reported on 03/29/2015 1 Inhaler 3   albuterol (PROVENTIL) (2.5 MG/3ML) 0.083% nebulizer solution Take 2.5 mg by nebulization as needed.     ALPRAZolam (XANAX) 0.5 MG tablet TAKE 1/2 TO 1 TABLET BY MOUTH AS NEEDED FOR PANIC ATTACKS 30 tablet 3   ascorbic acid (VITAMIN C) 500 MG tablet Take 1 tablet (500 mg total) by mouth 2 (two) times daily. 60 tablet 0   aspirin EC 81 MG tablet Take 81 mg by mouth daily.     aspirin-acetaminophen-caffeine (EXCEDRIN MIGRAINE) 250-250-65 MG tablet Take 2 tablets by mouth every 6 (six) hours as needed for migraine.     carvedilol (COREG) 6.25 MG tablet TAKE 1 TABLET BY MOUTH TWICE A DAY 180 tablet 1   Continuous Glucose Sensor (FREESTYLE LIBRE 2 SENSOR) MISC 1 each by Does not apply route every 14 (fourteen) days. 2 each 3   desvenlafaxine (PRISTIQ) 100 MG 24 hr tablet TAKE 1 TABLET BY MOUTH EVERY DAY 90 tablet 2   Ensure Max Protein (ENSURE MAX PROTEIN) LIQD Take 330 mLs (11 oz  total) by mouth 2 (two) times daily.     fenofibrate 54 MG tablet TAKE 1 TABLET BY MOUTH EVERY DAY 90 tablet 3   fluconazole (DIFLUCAN) 200 MG tablet Take 1 tablet (200 mg total) by mouth daily. 30 tablet 3   HYDROmorphone (DILAUDID) 2 MG tablet Take 0.5 tablets (1 mg total) by mouth every 4 (four) hours as needed for severe pain (pain score 7-10) or moderate pain (pain score 4-6). 45 tablet 0   insulin NPH-regular Human (70-30) 100 UNIT/ML injection Inject 20-25 Units into the skin 2 (two) times daily with a meal.     metFORMIN (GLUCOPHAGE) 500 MG tablet TAKE TWO TABLETS EACH MORNING. AND 3 TABLETS IN THE EVENING AS DIRECTED 150 tablet 5   methocarbamol (ROBAXIN) 500 MG tablet Take 1 tablet (500 mg total) by mouth every 8 (eight) hours as needed for muscle spasms. 30 tablet 0   montelukast (SINGULAIR) 10 MG tablet TAKE 1 TABLET BY MOUTH EVERYDAY AT BEDTIME 90 tablet 1   nystatin (MYCOSTATIN/NYSTOP) powder APPLY TOPICALLY TWICE DAILY AS NEEDED 30 g 3   nystatin ointment (MYCOSTATIN) Apply 1 Application topically 2 (two) times daily. 30 g 0   ondansetron (ZOFRAN-ODT) 8 MG disintegrating tablet Take 1 tablet (8 mg total) by mouth every 8 (eight) hours as needed for nausea or vomiting. 90 tablet 3   pantoprazole (PROTONIX) 40 MG tablet TAKE 1 TABLET BY MOUTH EVERY DAY 90 tablet 3   pravastatin (PRAVACHOL) 40 MG tablet TAKE 1 TABLET BY MOUTH EVERY DAY 30 tablet 5   traMADol (ULTRAM) 50 MG tablet Take 50 mg by mouth every 6 (six) hours as needed (for migraines).     zinc sulfate 220 (50 Zn) MG capsule Take 1 capsule (220 mg total) by mouth daily. 30 capsule 0   amLODipine (NORVASC) 5 MG tablet Take 1 tablet (5 mg total) by mouth daily. (Patient not taking: Reported on 11/06/2022) 30 tablet 2   iron polysaccharides (NIFEREX) 150 MG capsule Take 1 capsule (150  mg total) by mouth daily. 30 capsule 2   Multiple Vitamin (MULTIVITAMIN WITH MINERALS) TABS tablet Take 1 tablet by mouth daily. (Patient not  taking: Reported on 11/06/2022) 30 tablet 2   No current facility-administered medications for this visit.    Review of Systems General: negative for, fevers, chills, fatigue, changes in sleep, changes in weight or appetite Skin: negative for changes in color, texture, moles or lesions Eyes: negative for, changes in vision, pain, diplopia HEENT: negative for, change in hearing, pain, discharge, tinnitus, vertigo, voice changes, sore throat, neck masses Pulmonary: negative for, dyspnea, orthopnea, productive cough Cardiac: negative for, palpitations, syncope, pain, discomfort, pressure Gastrointestinal: negative for, dysphagia, jaundice, constipation, diarrhea, hematemesis, hematochezia Genitourinary/Sexual: negative for, dysuria, discharge, hesitancy, nocturia, retention, stones, infections, STD's, incontinence Ob/Gyn: negative for, irregular bleeding, pain Musculoskeletal: negative for, stiffness, swelling, range of motion limitation Hematology: negative for, easy bruising, bleeding Neurologic/Psych: negative for, seizures, paralysis, weakness, tremor, change in gait, change in sensation, mood swings, depression, anxiety, change in memory  Objective:  Physical Examination:  Wt 258 lb 11.2 oz (117.3 kg)   LMP 02/08/2013 (Within Months)   BMI 38.20 kg/m    ECOG Performance Status: 2 - Symptomatic, <50% confined to bed  General appearance: alert, cooperative, and appears stated age HEENT:PERRLA and thyroid without masses Lymph node survey: non-palpable, axillary, inguinal, supraclavicular Cardiovascular: regular rate and rhythm, no murmurs or gallops Respiratory: normal air entry, lungs clear to auscultation and no rales, rhonchi or wheezing Abdomen: distended with ascites.  Back: inspection of back is normal Extremities: BKA Skin exam - normal coloration and turgor, no rashes, no suspicious skin lesions noted. Neurological exam reveals alert, oriented, normal speech, no focal  findings or movement disorder noted.  Pelvic: exam chaperoned by nurse;  Vulva: normal appearing vulva with no masses, tenderness or lesions; Vagina: normal vagina; Adnexa: normal adnexa in size, nontender and no masses; Uterus: uterus is normal size, shape, consistency and nontender; Cervix: anteverted; Rectal: not indicated  Lab Review   Chemistry      Component Value Date/Time   NA 133 (L) 10/29/2022 0452   NA 132 (L) 08/07/2022 1210   NA 133 (L) 08/21/2012 1523   K 4.8 10/29/2022 0452   K 4.1 08/21/2012 1523   CL 98 10/29/2022 0452   CL 102 08/21/2012 1523   CO2 26 10/29/2022 0452   CO2 26 08/21/2012 1523   BUN 33 (H) 10/29/2022 0452   BUN 23 08/07/2022 1210   BUN 17 08/21/2012 1523   CREATININE 0.74 10/29/2022 0452   CREATININE 0.69 08/21/2012 1523   GLU 231 02/07/2012 0000      Component Value Date/Time   CALCIUM 8.7 (L) 10/29/2022 0452   CALCIUM 8.8 08/21/2012 1523   ALKPHOS 53 10/23/2022 0510   ALKPHOS 73 08/21/2012 1523   AST 13 (L) 10/23/2022 0510   AST 19 08/21/2012 1523   ALT 7 10/23/2022 0510   ALT 25 08/21/2012 1523   BILITOT 0.5 10/23/2022 0510   BILITOT 0.3 08/07/2022 1210   BILITOT 0.2 08/21/2012 1523      Lab Results  Component Value Date   WBC 9.4 10/29/2022   HGB 10.0 (L) 10/29/2022   HCT 32.2 (L) 10/29/2022   MCV 91.7 10/29/2022   PLT 671 (H) 10/29/2022    Radiologic Imaging: CT scan 10/22/22  FINDINGS: Lower chest: 6 no acute abnormality   Hepatobiliary: No focal liver abnormality is seen. Status post cholecystectomy. No biliary dilatation.   Pancreas: No focal abnormality or  ductal dilatation.   Spleen: No focal abnormality.  Normal size.   Adrenals/Urinary Tract: No adrenal abnormality. No focal renal abnormality. No stones or hydronephrosis. Urinary bladder is unremarkable.   Stomach/Bowel: Stomach, large and small bowel grossly unremarkable.   Vascular/Lymphatic: Aortic atherosclerosis. No evidence of aneurysm or  adenopathy.   Reproductive: Uterus and adnexa unremarkable.  No mass.   Other: Large volume ascites in the abdomen and pelvis. Omental thickening/caking noted anteriorly. Appearance is concerning for peritoneal carcinomatosis, often seen with ovarian cancer. However, no ovarian mass visualized.   Musculoskeletal: No acute bony abnormality.   IMPRESSION: Large volume ascites in the abdomen or pelvis with omental caking. Appearance is concerning for peritoneal malignancy/carcinomatosis, often seen with ovarian cancer, but no ovarian mass visualized.   Aortic atherosclerosis.      Assessment:  Nancy [name] is a 57 y.o. 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 CA125 elevated at 762, and CEA and CA19-9 normal. 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     Medical co-morbidities complicating care: Diabetic vascular disease s/p recent left BKA due to gangrene, cardiomyopathy, obesity BMI 39.  Plan:   Problem List Items Addressed This Visit       Other   Malignant ascites - Primary   We discussed with the patient and her wife the options for management including omental biopsy for IHC studies to confirm cancer histology.  She has significant ascites and not eating normally, but explained that the fluid should abate rapidly with starting chemotherapy.  Do not think paracentesis needed again presently.   She has significant medical comorbidities and would recommend neoadjuvant chemotherapy with carboplatin/taxol with serial CA125 and CT scan after 3 cycles and consideration of interval cytoreductive surgery.   Khorana score 3 (high risk due to Ovarian cancer, high BMI and elevated platelets).  Recommend prophylactic apixiban in view of high risk of VTE.    Discussed that hereditary genetic panel testing will be recommended.  The patient's diagnosis, an outline of the further  diagnostic and laboratory studies which will be required, the recommendation, and alternatives were discussed.  All questions were answered to the patient's satisfaction.  A total of 60 minutes were spent with the patient/family today; 50% was spent in education, counseling and coordination of care for ovarian cancer.    Leida Lauth, MD   CC:  Miki Kins, FNP [address] [place],  [place] [address] [phone]

## 2022-11-06 NOTE — Assessment & Plan Note (Signed)
Encourage patient to adhere to diabetic diet and current diabetes regimen.  Glucose may be elevated due to steroid use during chemotherapy.

## 2022-11-06 NOTE — Assessment & Plan Note (Signed)
Lab Results  Component Value Date   HGB 10.0 (L) 10/29/2022   TIBC 195 (L) 09/19/2022   IRONPCTSAT 7 (L) 09/19/2022   FERRITIN 314 (H) 09/19/2022    Recommend oral iron supplementation.

## 2022-11-06 NOTE — Addendum Note (Signed)
Addended by: Ladora Daniel D on: 11/06/2022 09:50 AM   Modules accepted: Orders

## 2022-11-06 NOTE — Assessment & Plan Note (Signed)
Malignant ascites. We about the limited quantity of cytology specimen and will need additional CT guided omentum biopsy for further evaluation.  Obtain PET scan for staging.  Likely she has primary peritoneum carcinomatosis or reproductive organ malignancy. CA125 is significantly elevated in 700s.  I discussed case with gynonc and we recommend neoadjuvant chemotherapy with carboplatin and Taxol for 3 cycles followed by repeat CT.  I explained to the patient the risks and benefits of chemotherapy including all but not limited to infusion reaction, hair loss, hearing loss, mouth sore, nausea, vomiting,diarrhea, low blood counts, bleeding, neuropathy and risk of life threatening infection and even death etc.  Patient voices understanding and willing to proceed chemotherapy.  She understands that chemotherapy regimen may be modified if her additional work up indicates other tissue origin,   # Chemotherapy education; Medi port placement is an option. Due to lack of peripheral IV, patient prefers to get medi port placed. Will  ask IR to place.  Antiemetics-Zofran and Compazine; EMLA cream sent to pharmacy Supportive care measures are necessary for patient well-being and will be provided as necessary. We spent sufficient time to discuss many aspect of care, questions were answered to patient's satisfaction.

## 2022-11-06 NOTE — Progress Notes (Signed)
Hematology/Oncology Progress note [phone] [phone]        REFERRING PROVIDER: Miki Kins, FNP    CHIEF COMPLAINTS/PURPOSE OF CONSULTATION:  Primary peritoneal carcinomatosis  ASSESSMENT & PLAN:   Goals of care, counseling/discussion Discussed with patient.   Primary peritoneal carcinomatosis (HCC) Malignant ascites. We about the limited quantity of cytology specimen and will need additional CT guided omentum biopsy for further evaluation.  Obtain PET scan for staging.  Likely she has primary peritoneum carcinomatosis or reproductive organ malignancy. CA125 is significantly elevated in 700s.  I discussed case with gynonc and we recommend neoadjuvant chemotherapy with carboplatin and Taxol for 3 cycles followed by repeat CT.  I explained to the patient the risks and benefits of chemotherapy including all but not limited to infusion reaction, hair loss, hearing loss, mouth sore, nausea, vomiting,diarrhea, low blood counts, bleeding, neuropathy and risk of life threatening infection and even death etc.  Patient voices understanding and willing to proceed chemotherapy.  She understands that chemotherapy regimen may be modified if her additional work up indicates other tissue origin,   # Chemotherapy education; Medi port placement is an option. Due to lack of peripheral IV, patient prefers to get medi port placed. Will  ask IR to place.  Antiemetics-Zofran and Compazine; EMLA cream sent to pharmacy Supportive care measures are necessary for patient well-being and will be provided as necessary. We spent sufficient time to discuss many aspect of care, questions were answered to patient's satisfaction.   Diabetes mellitus, type 2 (HCC) Encourage patient to adhere to diabetic diet and current diabetes regimen.  Glucose may be elevated due to steroid use during chemotherapy.   Protein calorie malnutrition (HCC) Refer to establish care with nutritionist.    IDA (iron deficiency anemia) Lab Results  Component Value Date   HGB 10.0 (L) 10/29/2022   TIBC 195 (L) 09/19/2022   IRONPCTSAT 7 (L) 09/19/2022   FERRITIN 314 (H) 09/19/2022    Recommend oral iron supplementation.    Orders Placed This Encounter  Procedures   NM PET Image Initial (PI) Skull Base To Thigh    Standing Status:   Future    Standing Expiration Date:   11/06/2023    Order Specific Question:   If indicated for the ordered procedure, I authorize the administration of a radiopharmaceutical per Radiology protocol    Answer:   Yes    Order Specific Question:   Is the patient pregnant?    Answer:   No    Order Specific Question:   Preferred imaging location?    Answer:   [facility]   IR IMAGING GUIDED PORT INSERTION    Standing Status:   Future    Standing Expiration Date:   11/06/2023    Order Specific Question:   Reason for Exam (SYMPTOM  OR DIAGNOSIS REQUIRED)    Answer:   needs port placed for chemotherapy    Order Specific Question:   Is the patient pregnant?    Answer:   No    Order Specific Question:   Preferred Imaging Location?    Answer:   [facility]   Follow up TBD  All questions were answered. The patient knows to call the clinic with any problems, questions or concerns.  Rickard Patience, MD, PhD [facility] Hematology Oncology 11/06/2022    HISTORY OF PRESENTING ILLNESS:  Nancy [name] 57 y.o. female presents to establish care for  peritoneal carcinomatosis I have reviewed her chart and materials related to her  cancer extensively and collaborated history with the patient. Summary of oncologic history is as follows: Oncology History  Primary peritoneal carcinomatosis (HCC)  11/02/2022 Imaging   Large volume ascites in the abdomen or pelvis with omental caking.Appearance is concerning for peritoneal malignancy/carcinomatosis,often seen with ovarian cancer, but no ovarian mass visualized. Aortic atherosclerosis    11/06/2022 Initial  Diagnosis   Primary peritoneal carcinomatosis   -presented to emergency room for evaluation of abdominal pain/bloating, poor oral intake, nonbloody nonbilious vomiting  Patient underwent paracentesis, cytology showed malignant cells. IHC can not be added.    history of sepsis secondary to diabetic foot infection/osteomyelitis status post left BKA done on 09/24/2022,    Today she presents to establish care outpatient.  Abdominal distension has improved after paracentesis x 2 times during her hospitalization. Accompanied by her spouse.  She is on Aspirin 81mg  daily.    MEDICAL HISTORY:  Past Medical History:  Diagnosis Date   Abnormal cardiovascular stress test 09/21/2018   Formatting of this note might be different from the original. Lexiscan Myoview 09/16/2018 revealed mild anterior ischemia   Adverse effect of motion 05/10/2014   Anxiety    Arthritis    r knee   Asthma    Breast cyst 05/10/2014   CAD (coronary artery disease)    a.) LHC 06/04/2009 at [facility]; non-obstructive CAD. b.) CTA with FFR 10/08/2018: extensive mixed plaque proximal to mid LAD (51-69%); Coronary Ca score 217; FFR 0.71 dPDA, 0.86 mLAD, 0.87 dLCx.   CCF (congestive cardiac failure) (HCC) 06/06/2008   a.) 30% EF. b.) TTE 06/03/2011: EF >55%; triv MR. c.) TTE 04/27/2018: EF 55%; mild LVH; triv PR, mild MR/TR; G1DD.   Chest pain with high risk for cardiac etiology 07/02/2016   Chronic use of opiate drug for therapeutic purpose 02/13/2022   Formatting of this note might be different from the original. [facility] Pain Contract signed on 04/18/16 & updated 03/04/17; UDS done on 04/18/16.   Complication of anesthesia    Diabetic foot infection (HCC) 03/13/2015   Diabetic foot ulcer (HCC) 02/08/2021   Eczema    Family history of adverse reaction to anesthesia    a.) PONV in mother and grandmother   GERD (gastroesophageal reflux disease) 05/10/2014   History of kidney stones    HLD (hyperlipidemia)    Hyponatremia 03/13/2015    Migraines    Motion sickness    Other acute osteomyelitis, left ankle and foot (HCC) 02/13/2022   Panic attacks    Peripheral edema 04/27/2018   Pneumonia    PONV (postoperative nausea and vomiting)    Sepsis secondary to diabetic foot infection 03/13/2015   T2DM (type 2 diabetes mellitus) (HCC)    Takotsubo cardiomyopathy / transient apical balooning syndrome / stress-induced cardiomyopathy 05/10/2014   Unspecified essential hypertension     SURGICAL HISTORY: Past Surgical History:  Procedure Laterality Date   AMPUTATION Left 09/19/2022   Procedure: AMPUTATION BELOW KNEE;  Surgeon: Renford Dills, MD;  Location: [facility];  Service: Vascular;  Laterality: Left;   AMPUTATION Left 09/24/2022   Procedure: AMPUTATION BELOW KNEE WITH WOUND CLOSURE;  Surgeon: Renford Dills, MD;  Location: [facility];  Service: Vascular;  Laterality: Left;   AMPUTATION TOE Left 03/16/2015   Procedure: left fifth toe amputation with incision and drainage;  Surgeon: Gwyneth Revels, DPM;  Location: [facility];  Service: Podiatry;  Laterality: Left;   APPLICATION OF WOUND VAC Left 02/14/2021   Procedure: APPLICATION OF WOUND VAC;  Surgeon: Gwyneth Revels, DPM;  Location: [facility];  Service: Podiatry;  Laterality: Left;   APPLICATION OF WOUND VAC Left 04/16/2021   Procedure: APPLICATION OF WOUND VAC;  Surgeon: Peggye Form, DO;  Location: [facility];  Service: Plastics;  Laterality: Left;   CARDIAC CATHETERIZATION Left 06/04/2009   Procedure: CARDIAC CATHETERIZATION; Location: [facility]   CHOLECYSTECTOMY     DEBRIDEMENT AND CLOSURE WOUND Left 04/16/2021   Procedure: DEBRIDEMENT AND CLOSURE WOUND;  Surgeon: Peggye Form, DO;  Location: [facility];  Service: Plastics;  Laterality: Left;  1 hour   IRRIGATION AND DEBRIDEMENT FOOT Left 02/08/2021   Procedure: IRRIGATION AND DEBRIDEMENT FOOT - LFT HEEL ULCER;  Surgeon: Gwyneth Revels, DPM;  Location: [facility];   Service: Podiatry;  Laterality: Left;   IRRIGATION AND DEBRIDEMENT FOOT Left 02/14/2021   Procedure: IRRIGATION AND DEBRIDEMENT LEFT HEEL;  Surgeon: Gwyneth Revels, DPM;  Location: [facility];  Service: Podiatry;  Laterality: Left;   kidney stone removal     KNEE ARTHROSCOPY W/ MENISCAL REPAIR     NOSE SURGERY  01/07/1989   due to fracture   TEE WITHOUT CARDIOVERSION N/A 09/26/2022   Procedure: TRANSESOPHAGEAL ECHOCARDIOGRAM;  Surgeon: Clotilde Dieter, DO;  Location: [facility];  Service: Cardiovascular;  Laterality: N/A;   TOE AMPUTATION     second toe   TONSILLECTOMY     TOTAL KNEE ARTHROPLASTY  01/11/2011   Procedure: TOTAL KNEE ARTHROPLASTY;  Surgeon: Harvie Junior;  Location: [facility];  Service: Orthopedics;  Laterality: Right;  COMPUTER ASSISTED TOTAL KNEE REPLACEMENT    SOCIAL HISTORY: Social History   Socioeconomic History   Marital status: Married    Spouse name: Not on file   Number of children: Not on file   Years of education: Not on file   Highest education level: Not on file  Occupational History   Not on file  Tobacco Use   Smoking status: Former    Current packs/day: 0.00    Types: Cigarettes    Quit date: 03/12/1985    Years since quitting: 37.6   Smokeless tobacco: Never  Vaping Use   Vaping status: Never Used  Substance and Sexual Activity   Alcohol use: No   Drug use: No   Sexual activity: Yes    Birth control/protection: Post-menopausal  Other Topics Concern   Not on file  Social History Narrative   Full time. Does not regularly exercise. Domestic partner.    Social Determinants of Health   Financial Resource Strain: Not on file  Food Insecurity: No Food Insecurity (10/22/2022)   Hunger Vital Sign    Worried About Running Out of Food in the Last Year: Never true    Ran Out of Food in the Last Year: Never true  Transportation Needs: No Transportation Needs (10/22/2022)   PRAPARE - Administrator, Civil Service (Medical): No    Lack of  Transportation (Non-Medical): No  Physical Activity: Not on file  Stress: Not on file  Social Connections: Not on file  Intimate Partner Violence: Not At Risk (10/22/2022)   Humiliation, Afraid, Rape, and Kick questionnaire    Fear of Current or Ex-Partner: No    Emotionally Abused: No    Physically Abused: No    Sexually Abused: No    FAMILY HISTORY: Family History  Problem Relation Age of Onset   Transient ischemic attack Mother    Asthma Mother    Hypertension Mother    Lung cancer Father  Hypertension Father    Asthma Sister    Anxiety disorder Sister    Stomach cancer Maternal Grandmother    Colon cancer Maternal Grandmother    Leukemia Maternal Grandmother    Melanoma Maternal Grandfather    Asthma Sister    Hypertension Brother    Cancer Other    Crohn's disease Other    GER disease Other    Breast cancer Neg Hx     ALLERGIES:  is allergic to shellfish allergy, codeine, morphine and codeine, percocet [oxycodone-acetaminophen], rosuvastatin, sulfa antibiotics, and theophyllines.  MEDICATIONS:  Current Outpatient Medications  Medication Sig Dispense Refill   acidophilus (RISAQUAD) CAPS capsule Take 1 capsule by mouth daily.     albuterol (PROVENTIL HFA;VENTOLIN HFA) 108 (90 Base) MCG/ACT inhaler Inhale 2 puffs into the lungs every 4 (four) hours as needed for wheezing or shortness of breath. Reported on 03/29/2015 1 Inhaler 3   albuterol (PROVENTIL) (2.5 MG/3ML) 0.083% nebulizer solution Take 2.5 mg by nebulization as needed.     ALPRAZolam (XANAX) 0.5 MG tablet TAKE 1/2 TO 1 TABLET BY MOUTH AS NEEDED FOR PANIC ATTACKS 30 tablet 3   amLODipine (NORVASC) 5 MG tablet Take 1 tablet (5 mg total) by mouth daily. (Patient not taking: Reported on 11/06/2022) 30 tablet 2   ascorbic acid (VITAMIN C) 500 MG tablet Take 1 tablet (500 mg total) by mouth 2 (two) times daily. 60 tablet 0   aspirin EC 81 MG tablet Take 81 mg by mouth daily.     aspirin-acetaminophen-caffeine  (EXCEDRIN MIGRAINE) 250-250-65 MG tablet Take 2 tablets by mouth every 6 (six) hours as needed for migraine.     carvedilol (COREG) 6.25 MG tablet TAKE 1 TABLET BY MOUTH TWICE A DAY 180 tablet 1   Continuous Glucose Sensor (FREESTYLE LIBRE 2 SENSOR) MISC 1 each by Does not apply route every 14 (fourteen) days. 2 each 3   desvenlafaxine (PRISTIQ) 100 MG 24 hr tablet TAKE 1 TABLET BY MOUTH EVERY DAY 90 tablet 2   Ensure Max Protein (ENSURE MAX PROTEIN) LIQD Take 330 mLs (11 oz total) by mouth 2 (two) times daily.     fenofibrate 54 MG tablet TAKE 1 TABLET BY MOUTH EVERY DAY 90 tablet 3   fluconazole (DIFLUCAN) 200 MG tablet Take 1 tablet (200 mg total) by mouth daily. 30 tablet 3   HYDROmorphone (DILAUDID) 2 MG tablet Take 0.5 tablets (1 mg total) by mouth every 4 (four) hours as needed for severe pain (pain score 7-10) or moderate pain (pain score 4-6). 45 tablet 0   insulin NPH-regular Human (70-30) 100 UNIT/ML injection Inject 20-25 Units into the skin 2 (two) times daily with a meal.     iron polysaccharides (NIFEREX) 150 MG capsule Take 1 capsule (150 mg total) by mouth daily. 30 capsule 2   metFORMIN (GLUCOPHAGE) 500 MG tablet TAKE TWO TABLETS EACH MORNING. AND 3 TABLETS IN THE EVENING AS DIRECTED 150 tablet 5   methocarbamol (ROBAXIN) 500 MG tablet Take 1 tablet (500 mg total) by mouth every 8 (eight) hours as needed for muscle spasms. 30 tablet 0   montelukast (SINGULAIR) 10 MG tablet TAKE 1 TABLET BY MOUTH EVERYDAY AT BEDTIME 90 tablet 1   Multiple Vitamin (MULTIVITAMIN WITH MINERALS) TABS tablet Take 1 tablet by mouth daily. (Patient not taking: Reported on 11/06/2022) 30 tablet 2   nystatin (MYCOSTATIN/NYSTOP) powder APPLY TOPICALLY TWICE DAILY AS NEEDED 30 g 3   nystatin ointment (MYCOSTATIN) Apply 1 Application topically 2 (two) times  daily. 30 g 0   ondansetron (ZOFRAN-ODT) 8 MG disintegrating tablet Take 1 tablet (8 mg total) by mouth every 8 (eight) hours as needed for nausea or  vomiting. 90 tablet 3   pantoprazole (PROTONIX) 40 MG tablet TAKE 1 TABLET BY MOUTH EVERY DAY 90 tablet 3   pravastatin (PRAVACHOL) 40 MG tablet TAKE 1 TABLET BY MOUTH EVERY DAY 30 tablet 5   traMADol (ULTRAM) 50 MG tablet Take 50 mg by mouth every 6 (six) hours as needed (for migraines).     zinc sulfate 220 (50 Zn) MG capsule Take 1 capsule (220 mg total) by mouth daily. 30 capsule 0   No current facility-administered medications for this visit.    Review of Systems  Constitutional:  Positive for appetite change and unexpected weight change. Negative for chills, fatigue and fever.  HENT:   Negative for hearing loss and voice change.   Eyes:  Negative for eye problems.  Respiratory:  Negative for chest tightness, cough and shortness of breath.   Cardiovascular:  Negative for chest pain and leg swelling.  Gastrointestinal:  Positive for abdominal distention. Negative for abdominal pain and blood in stool.  Endocrine: Negative for hot flashes.  Genitourinary:  Negative for difficulty urinating and frequency.   Musculoskeletal:  Negative for arthralgias.       S/p left  BKA   Skin:  Negative for itching and rash.  Neurological:  Negative for extremity weakness.  Hematological:  Negative for adenopathy.  Psychiatric/Behavioral:  Negative for confusion.      PHYSICAL EXAMINATION: ECOG PERFORMANCE STATUS: 1 - Symptomatic but completely ambulatory  Vitals:   11/06/22 0906  BP: (!) 149/51  Pulse: 92  Resp: 20  Temp: 97.6 F (36.4 C)   Filed Weights   11/06/22 0906  Weight: 258 lb 11.2 oz (117.3 kg)    Physical Exam Constitutional:      General: She is not in acute distress.    Appearance: She is obese. She is not diaphoretic.  HENT:     Head: Normocephalic and atraumatic.     Mouth/Throat:     Pharynx: No oropharyngeal exudate.  Eyes:     General: No scleral icterus. Cardiovascular:     Rate and Rhythm: Normal rate and regular rhythm.     Heart sounds: No murmur  heard. Pulmonary:     Effort: Pulmonary effort is normal. No respiratory distress.     Breath sounds: Normal breath sounds. No wheezing.  Abdominal:     General: There is distension.     Palpations: Abdomen is soft.     Tenderness: There is no abdominal tenderness.  Musculoskeletal:        General: Normal range of motion.     Cervical back: Normal range of motion and neck supple.     Comments: S/p left BKA  Skin:    General: Skin is warm and dry.     Findings: No erythema.  Neurological:     Mental Status: She is alert and oriented to person, place, and time. Mental status is at baseline.     Cranial Nerves: No cranial nerve deficit.     Motor: No abnormal muscle tone.  Psychiatric:        Mood and Affect: Mood and affect normal.      LABORATORY DATA:  I have reviewed the data as listed    Latest Ref Rng & Units 10/29/2022    4:52 AM 10/26/2022    8:07 AM 10/23/2022  5:10 AM  CBC  WBC 4.0 - 10.5 K/uL 9.4  10.2  9.7   Hemoglobin 12.0 - 15.0 g/dL 81.1  91.4  9.2   Hematocrit 36.0 - 46.0 % 32.2  32.5  29.1   Platelets 150 - 400 K/uL 671  689  537       Latest Ref Rng & Units 10/29/2022    4:52 AM 10/26/2022    8:07 AM 10/23/2022    5:10 AM  CMP  Glucose 70 - 99 mg/dL 782  956  213   BUN 6 - 20 mg/dL 33  34  48   Creatinine 0.44 - 1.00 mg/dL 0.86  5.78  4.69   Sodium 135 - 145 mmol/L 133  134  132   Potassium 3.5 - 5.1 mmol/L 4.8  5.5  4.8   Chloride 98 - 111 mmol/L 98  100  99   CO2 22 - 32 mmol/L 26  27  24    Calcium 8.9 - 10.3 mg/dL 8.7  8.9  8.3   Total Protein 6.5 - 8.1 g/dL   7.3   Total Bilirubin 0.3 - 1.2 mg/dL   0.5   Alkaline Phos 38 - 126 U/L   53   AST 15 - 41 U/L   13   ALT 0 - 44 U/L   7      RADIOGRAPHIC STUDIES: I have personally reviewed the radiological images as listed and agreed with the findings in the report. US Paracentesis  Result Date: 10/28/2022 INDICATION: Patient with peritoneal carcinomatosis with recurrent malignant ascites.  Therapeutic paracentesis requested. EXAM: ULTRASOUND GUIDED PARACENTESIS MEDICATIONS: 1% lidocaine 10 mL COMPLICATIONS: None immediate. PROCEDURE: Informed written consent was obtained from the patient after a discussion of the risks, benefits and alternatives to treatment. A timeout was performed prior to the initiation of the procedure. Initial ultrasound scanning demonstrates a large amount of ascites within the left lower abdominal quadrant. The left lower abdomen was prepped and draped in the usual sterile fashion. 1% lidocaine was used for local anesthesia. Following this, a 19 gauge, 10-cm, Yueh catheter was introduced. An ultrasound image was saved for documentation purposes. The paracentesis was performed. The catheter was removed and a dressing was applied. The patient tolerated the procedure well without immediate post procedural complication. Patient received post-procedure intravenous albumin; see nursing notes for details. FINDINGS: A total of approximately 3.1 L of clear yellow fluid was removed. IMPRESSION: Successful ultrasound-guided paracentesis yielding 3.1 liters of peritoneal fluid. Procedure performed by Alwyn Ren NP Electronically Signed   By: Malachy Moan M.D.   On: 10/28/2022 11:04   US Paracentesis  Result Date: 10/24/2022 INDICATION: Patient presents today with possible malignant ascites. Diagnostic and therapeutic paracentesis. EXAM: ULTRASOUND GUIDED PARACENTESIS MEDICATIONS: 1% lidocaine 10 mL COMPLICATIONS: None immediate. PROCEDURE: Informed written consent was obtained from the patient after a discussion of the risks, benefits and alternatives to treatment. A timeout was performed prior to the initiation of the procedure. Initial ultrasound scanning demonstrates a large amount of ascites within the left lower abdominal quadrant. The left lower abdomen was prepped and draped in the usual sterile fashion. 1% lidocaine was used for local anesthesia. Following this, a 19  gauge, 10-cm, Yueh catheter was introduced. An ultrasound image was saved for documentation purposes. The paracentesis was performed. The catheter was removed and a dressing was applied. The patient tolerated the procedure well without immediate post procedural complication. Patient received post-procedure intravenous albumin; see nursing notes for details. FINDINGS: A total  of approximately 3.9 L of clear yellow fluid was removed. IMPRESSION: Successful ultrasound-guided paracentesis yielding 3.9 liters of peritoneal fluid. Procedure performed by Alwyn Ren NP Electronically Signed   By: Olive Bass M.D.   On: 10/24/2022 08:12   CT ABDOMEN PELVIS W CONTRAST  Result Date: 10/22/2022 CLINICAL DATA:  Abdominal pain, most pronounced in the left lower quadrant and suprapubic regions. Vomiting, fever. EXAM: CT ABDOMEN AND PELVIS WITH CONTRAST TECHNIQUE: Multidetector CT imaging of the abdomen and pelvis was performed using the standard protocol following bolus administration of intravenous contrast. RADIATION DOSE REDUCTION: This exam was performed according to the departmental dose-optimization program which includes automated exposure control, adjustment of the mA and/or kV according to patient size and/or use of iterative reconstruction technique. CONTRAST:  OMNIPAQUE IOHEXOL 300 MG/ML  SOLN COMPARISON:  12/12/2004 FINDINGS: Lower chest: 6 no acute abnormality Hepatobiliary: No focal liver abnormality is seen. Status post cholecystectomy. No biliary dilatation. Pancreas: No focal abnormality or ductal dilatation. Spleen: No focal abnormality.  Normal size. Adrenals/Urinary Tract: No adrenal abnormality. No focal renal abnormality. No stones or hydronephrosis. Urinary bladder is unremarkable. Stomach/Bowel: Stomach, large and small bowel grossly unremarkable. Vascular/Lymphatic: Aortic atherosclerosis. No evidence of aneurysm or adenopathy. Reproductive: Uterus and adnexa unremarkable.  No mass.  Other: Large volume ascites in the abdomen and pelvis. Omental thickening/caking noted anteriorly. Appearance is concerning for peritoneal carcinomatosis, often seen with ovarian cancer. However, no ovarian mass visualized. Musculoskeletal: No acute bony abnormality. IMPRESSION: Large volume ascites in the abdomen or pelvis with omental caking. Appearance is concerning for peritoneal malignancy/carcinomatosis, often seen with ovarian cancer, but no ovarian mass visualized. Aortic atherosclerosis. Electronically Signed   By: Charlett Nose M.D.   On: 10/22/2022 19:41

## 2022-11-06 NOTE — Assessment & Plan Note (Signed)
Discussed with patient

## 2022-11-06 NOTE — Progress Notes (Signed)
START OFF PATHWAY REGIMEN - Other   [id number]:Carboplatin AUC=5 IV D1 + Paclitaxel 175 mg/m2 IV D1 q21 Days:   A cycle is every 21 days:     Paclitaxel      Carboplatin   **Always confirm dose/schedule in your pharmacy ordering system**  Patient Characteristics: Intent of Therapy: Curative Intent, Discussed with Patient

## 2022-11-07 ENCOUNTER — Other Ambulatory Visit: Payer: Self-pay

## 2022-11-08 ENCOUNTER — Inpatient Hospital Stay: Payer: 59 | Attending: Oncology

## 2022-11-08 DIAGNOSIS — C482 Malignant neoplasm of peritoneum, unspecified: Secondary | ICD-10-CM | POA: Insufficient documentation

## 2022-11-08 DIAGNOSIS — R180 Malignant ascites: Secondary | ICD-10-CM | POA: Insufficient documentation

## 2022-11-08 DIAGNOSIS — D509 Iron deficiency anemia, unspecified: Secondary | ICD-10-CM | POA: Insufficient documentation

## 2022-11-08 DIAGNOSIS — E46 Unspecified protein-calorie malnutrition: Secondary | ICD-10-CM | POA: Insufficient documentation

## 2022-11-08 DIAGNOSIS — Z5111 Encounter for antineoplastic chemotherapy: Secondary | ICD-10-CM | POA: Insufficient documentation

## 2022-11-08 DIAGNOSIS — Z87891 Personal history of nicotine dependence: Secondary | ICD-10-CM | POA: Insufficient documentation

## 2022-11-11 NOTE — Progress Notes (Signed)
Subjective:    Patient ID: Nancy [name], female    DOB: 1965/07/26, 57 y.o.   MRN: [id number] Chief Complaint  Patient presents with   Follow-up    2-3 week wound check.    The patient is a 57 year old female who presents today for wound evaluation after left below-knee amputation on 09/24/2022 following gangrenous changes of her left foot.  Since the patient's last visit she was hospitalized and also found that she has developed cancer.  She notes that they suspect it is ovarian cancer.  She is currently in the process of undergoing treatment for this as well.  Her amputation site however has scabbed in several areas.  There is no actual open wounds.  She has received her shrinker and she has good mobility in the knee itself.    Review of Systems  Musculoskeletal:  Positive for gait problem.  Skin:  Positive for wound.  All other systems reviewed and are negative.      Objective:   Physical Exam Vitals reviewed.  HENT:     Head: Normocephalic.  Cardiovascular:     Rate and Rhythm: Normal rate and regular rhythm.  Pulmonary:     Effort: Pulmonary effort is normal.  Skin:    General: Skin is warm and dry.  Neurological:     Mental Status: She is alert and oriented to person, place, and time.     Gait: Gait abnormal.  Psychiatric:        Mood and Affect: Mood normal.        Behavior: Behavior normal.        Thought Content: Thought content normal.        Judgment: Judgment normal.     BP 134/86 (BP Location: Left Arm)   Pulse 75   Resp 18   Ht 5\' 9"  (1.753 m)   Wt 265 lb (120.2 kg)   LMP 02/08/2013 (Within Months)   BMI 39.13 kg/m   Past Medical History:  Diagnosis Date   Abnormal cardiovascular stress test 09/21/2018   Formatting of this note might be different from the original. Lexiscan Myoview 09/16/2018 revealed mild anterior ischemia   Adverse effect of motion 05/10/2014   Anxiety    Arthritis    r knee   Asthma    Breast cyst 05/10/2014   CAD  (coronary artery disease)    a.) LHC 06/04/2009 at [facility]; non-obstructive CAD. b.) CTA with FFR 10/08/2018: extensive mixed plaque proximal to mid LAD (51-69%); Coronary Ca score 217; FFR 0.71 dPDA, 0.86 mLAD, 0.87 dLCx.   CCF (congestive cardiac failure) (HCC) 06/06/2008   a.) 30% EF. b.) TTE 06/03/2011: EF >55%; triv MR. c.) TTE 04/27/2018: EF 55%; mild LVH; triv PR, mild MR/TR; G1DD.   Chest pain with high risk for cardiac etiology 07/02/2016   Chronic use of opiate drug for therapeutic purpose 02/13/2022   Formatting of this note might be different from the original. [facility] Pain Contract signed on 04/18/16 & updated 03/04/17; UDS done on 04/18/16.   Complication of anesthesia    Diabetic foot infection (HCC) 03/13/2015   Diabetic foot ulcer (HCC) 02/08/2021   Eczema    Family history of adverse reaction to anesthesia    a.) PONV in mother and grandmother   GERD (gastroesophageal reflux disease) 05/10/2014   History of kidney stones    HLD (hyperlipidemia)    Hyponatremia 03/13/2015   Migraines    Motion sickness    Other acute osteomyelitis, left ankle and  foot (HCC) 02/13/2022   Panic attacks    Peripheral edema 04/27/2018   Pneumonia    PONV (postoperative nausea and vomiting)    Sepsis secondary to diabetic foot infection 03/13/2015   T2DM (type 2 diabetes mellitus) (HCC)    Takotsubo cardiomyopathy / transient apical balooning syndrome / stress-induced cardiomyopathy 05/10/2014   Unspecified essential hypertension     Social History   Socioeconomic History   Marital status: Married    Spouse name: Not on file   Number of children: Not on file   Years of education: Not on file   Highest education level: Not on file  Occupational History   Not on file  Tobacco Use   Smoking status: Former    Current packs/day: 0.00    Types: Cigarettes    Quit date: 03/12/1985    Years since quitting: 37.6   Smokeless tobacco: Never  Vaping Use   Vaping status: Never Used  Substance and  Sexual Activity   Alcohol use: No   Drug use: No   Sexual activity: Yes    Birth control/protection: Post-menopausal  Other Topics Concern   Not on file  Social History Narrative   Full time. Does not regularly exercise. Domestic partner.    Social Determinants of Health   Financial Resource Strain: Not on file  Food Insecurity: No Food Insecurity (10/22/2022)   Hunger Vital Sign    Worried About Running Out of Food in the Last Year: Never true    Ran Out of Food in the Last Year: Never true  Transportation Needs: No Transportation Needs (10/22/2022)   PRAPARE - Administrator, Civil Service (Medical): No    Lack of Transportation (Non-Medical): No  Physical Activity: Not on file  Stress: Not on file  Social Connections: Not on file  Intimate Partner Violence: Not At Risk (10/22/2022)   Humiliation, Afraid, Rape, and Kick questionnaire    Fear of Current or Ex-Partner: No    Emotionally Abused: No    Physically Abused: No    Sexually Abused: No    Past Surgical History:  Procedure Laterality Date   AMPUTATION Left 09/19/2022   Procedure: AMPUTATION BELOW KNEE;  Surgeon: Renford Dills, MD;  Location: [facility];  Service: Vascular;  Laterality: Left;   AMPUTATION Left 09/24/2022   Procedure: AMPUTATION BELOW KNEE WITH WOUND CLOSURE;  Surgeon: Renford Dills, MD;  Location: [facility];  Service: Vascular;  Laterality: Left;   AMPUTATION TOE Left 03/16/2015   Procedure: left fifth toe amputation with incision and drainage;  Surgeon: Gwyneth Revels, DPM;  Location: [facility];  Service: Podiatry;  Laterality: Left;   APPLICATION OF WOUND VAC Left 02/14/2021   Procedure: APPLICATION OF WOUND VAC;  Surgeon: Gwyneth Revels, DPM;  Location: [facility];  Service: Podiatry;  Laterality: Left;   APPLICATION OF WOUND VAC Left 04/16/2021   Procedure: APPLICATION OF WOUND VAC;  Surgeon: Peggye Form, DO;  Location: [facility];  Service: Plastics;  Laterality:  Left;   CARDIAC CATHETERIZATION Left 06/04/2009   Procedure: CARDIAC CATHETERIZATION; Location: [facility]   CHOLECYSTECTOMY     DEBRIDEMENT AND CLOSURE WOUND Left 04/16/2021   Procedure: DEBRIDEMENT AND CLOSURE WOUND;  Surgeon: Peggye Form, DO;  Location: [facility];  Service: Plastics;  Laterality: Left;  1 hour   IRRIGATION AND DEBRIDEMENT FOOT Left 02/08/2021   Procedure: IRRIGATION AND DEBRIDEMENT FOOT - LFT HEEL ULCER;  Surgeon: Gwyneth Revels, DPM;  Location:  [facility];  Service: Podiatry;  Laterality: Left;   IRRIGATION AND DEBRIDEMENT FOOT Left 02/14/2021   Procedure: IRRIGATION AND DEBRIDEMENT LEFT HEEL;  Surgeon: Gwyneth Revels, DPM;  Location: [facility];  Service: Podiatry;  Laterality: Left;   kidney stone removal     KNEE ARTHROSCOPY W/ MENISCAL REPAIR     NOSE SURGERY  01/07/1989   due to fracture   TEE WITHOUT CARDIOVERSION N/A 09/26/2022   Procedure: TRANSESOPHAGEAL ECHOCARDIOGRAM;  Surgeon: Clotilde Dieter, DO;  Location: [facility];  Service: Cardiovascular;  Laterality: N/A;   TOE AMPUTATION     second toe   TONSILLECTOMY     TOTAL KNEE ARTHROPLASTY  01/11/2011   Procedure: TOTAL KNEE ARTHROPLASTY;  Surgeon: Harvie Junior;  Location: [facility];  Service: Orthopedics;  Laterality: Right;  COMPUTER ASSISTED TOTAL KNEE REPLACEMENT    Family History  Problem Relation Age of Onset   Transient ischemic attack Mother    Asthma Mother    Hypertension Mother    Lung cancer Father    Hypertension Father    Asthma Sister    Anxiety disorder Sister    Stomach cancer Maternal Grandmother    Colon cancer Maternal Grandmother    Leukemia Maternal Grandmother    Melanoma Maternal Grandfather    Asthma Sister    Hypertension Brother    Cancer Other    Crohn's disease Other    GER disease Other    Breast cancer Neg Hx     Allergies  Allergen Reactions   Shellfish Allergy Anaphylaxis   Codeine Other (See Comments)    Migraine  Other  Reaction(s): Other (See Comments)  Reaction:  Severe migraines   Morphine And Codeine Other (See Comments)    Reaction:  Severe migraines    Percocet [Oxycodone-Acetaminophen] Nausea And Vomiting   Rosuvastatin     Other reaction(s): Muscle Pain   Sulfa Antibiotics Rash   Theophyllines Itching and Rash       Latest Ref Rng & Units 10/29/2022    4:52 AM 10/26/2022    8:07 AM 10/23/2022    5:10 AM  CBC  WBC 4.0 - 10.5 K/uL 9.4  10.2  9.7   Hemoglobin 12.0 - 15.0 g/dL 40.9  81.1  9.2   Hematocrit 36.0 - 46.0 % 32.2  32.5  29.1   Platelets 150 - 400 K/uL 671  689  537       CMP     Component Value Date/Time   NA 133 (L) 10/29/2022 0452   NA 132 (L) 08/07/2022 1210   NA 133 (L) 08/21/2012 1523   K 4.8 10/29/2022 0452   K 4.1 08/21/2012 1523   CL 98 10/29/2022 0452   CL 102 08/21/2012 1523   CO2 26 10/29/2022 0452   CO2 26 08/21/2012 1523   GLUCOSE 147 (H) 10/29/2022 0452   GLUCOSE 306 (H) 08/21/2012 1523   BUN 33 (H) 10/29/2022 0452   BUN 23 08/07/2022 1210   BUN 17 08/21/2012 1523   CREATININE 0.74 10/29/2022 0452   CREATININE 0.69 08/21/2012 1523   CALCIUM 8.7 (L) 10/29/2022 0452   CALCIUM 8.8 08/21/2012 1523   PROT 7.3 10/23/2022 0510   PROT 7.0 08/07/2022 1210   PROT 7.1 08/21/2012 1523   ALBUMIN 2.6 (L) 10/23/2022 0510   ALBUMIN 4.0 08/07/2022 1210   ALBUMIN 3.4 08/21/2012 1523   AST 13 (L) 10/23/2022 0510   AST 19 08/21/2012 1523   ALT 7 10/23/2022 0510   ALT 25 08/21/2012 1523  ALKPHOS 53 10/23/2022 0510   ALKPHOS 73 08/21/2012 1523   BILITOT 0.5 10/23/2022 0510   BILITOT 0.3 08/07/2022 1210   BILITOT 0.2 08/21/2012 1523   EGFR 81 08/07/2022 1210   GFRNONAA >60 10/29/2022 0452   GFRNONAA >60 08/21/2012 1523     No results found.     Assessment & Plan:   1. Hx of AKA (above knee amputation), left (HCC) The drainage that was present has largely resolved.  She has some significant scabbing but I am hopeful that that will resolve soon.  She is  currently wearing her shrinker sock and she is advised to use a covering while wearing it.  Overall she is actually progressing fairly well considering all things.  Current Outpatient Medications on File Prior to Visit  Medication Sig Dispense Refill   acidophilus (RISAQUAD) CAPS capsule Take 1 capsule by mouth daily.     albuterol (PROVENTIL HFA;VENTOLIN HFA) 108 (90 Base) MCG/ACT inhaler Inhale 2 puffs into the lungs every 4 (four) hours as needed for wheezing or shortness of breath. Reported on 03/29/2015 1 Inhaler 3   albuterol (PROVENTIL) (2.5 MG/3ML) 0.083% nebulizer solution Take 2.5 mg by nebulization as needed.     ALPRAZolam (XANAX) 0.5 MG tablet TAKE 1/2 TO 1 TABLET BY MOUTH AS NEEDED FOR PANIC ATTACKS 30 tablet 3   ascorbic acid (VITAMIN C) 500 MG tablet Take 1 tablet (500 mg total) by mouth 2 (two) times daily. 60 tablet 0   aspirin EC 81 MG tablet Take 81 mg by mouth daily.     aspirin-acetaminophen-caffeine (EXCEDRIN MIGRAINE) 250-250-65 MG tablet Take 2 tablets by mouth every 6 (six) hours as needed for migraine.     carvedilol (COREG) 6.25 MG tablet TAKE 1 TABLET BY MOUTH TWICE A DAY 180 tablet 1   desvenlafaxine (PRISTIQ) 100 MG 24 hr tablet TAKE 1 TABLET BY MOUTH EVERY DAY 90 tablet 2   Ensure Max Protein (ENSURE MAX PROTEIN) LIQD Take 330 mLs (11 oz total) by mouth 2 (two) times daily.     fenofibrate 54 MG tablet TAKE 1 TABLET BY MOUTH EVERY DAY 90 tablet 3   HYDROmorphone (DILAUDID) 2 MG tablet Take 0.5 tablets (1 mg total) by mouth every 4 (four) hours as needed for severe pain (pain score 7-10) or moderate pain (pain score 4-6). 45 tablet 0   insulin NPH-regular Human (70-30) 100 UNIT/ML injection Inject 20-25 Units into the skin 2 (two) times daily with a meal.     metFORMIN (GLUCOPHAGE) 500 MG tablet TAKE TWO TABLETS EACH MORNING. AND 3 TABLETS IN THE EVENING AS DIRECTED 150 tablet 5   methocarbamol (ROBAXIN) 500 MG tablet Take 1 tablet (500 mg total) by mouth every 8  (eight) hours as needed for muscle spasms. 30 tablet 0   montelukast (SINGULAIR) 10 MG tablet TAKE 1 TABLET BY MOUTH EVERYDAY AT BEDTIME 90 tablet 1   nystatin (MYCOSTATIN/NYSTOP) powder APPLY TOPICALLY TWICE DAILY AS NEEDED 30 g 3   pantoprazole (PROTONIX) 40 MG tablet TAKE 1 TABLET BY MOUTH EVERY DAY 90 tablet 3   pravastatin (PRAVACHOL) 40 MG tablet TAKE 1 TABLET BY MOUTH EVERY DAY 30 tablet 5   traMADol (ULTRAM) 50 MG tablet Take 50 mg by mouth every 6 (six) hours as needed (for migraines).     zinc sulfate 220 (50 Zn) MG capsule Take 1 capsule (220 mg total) by mouth daily. 30 capsule 0   amLODipine (NORVASC) 5 MG tablet Take 1 tablet (5 mg total) by  mouth daily. (Patient not taking: Reported on 11/06/2022) 30 tablet 2   iron polysaccharides (NIFEREX) 150 MG capsule Take 1 capsule (150 mg total) by mouth daily. 30 capsule 2   Multiple Vitamin (MULTIVITAMIN WITH MINERALS) TABS tablet Take 1 tablet by mouth daily. (Patient not taking: Reported on 11/06/2022) 30 tablet 2   No current facility-administered medications on file prior to visit.    There are no Patient Instructions on file for this visit. No follow-ups on file.   Georgiana Spinner, NP

## 2022-11-12 ENCOUNTER — Other Ambulatory Visit: Payer: Self-pay | Admitting: Oncology

## 2022-11-12 ENCOUNTER — Ambulatory Visit (INDEPENDENT_AMBULATORY_CARE_PROVIDER_SITE_OTHER): Payer: 59 | Admitting: Family

## 2022-11-12 ENCOUNTER — Encounter: Payer: Self-pay | Admitting: Family

## 2022-11-12 ENCOUNTER — Other Ambulatory Visit: Payer: Self-pay

## 2022-11-12 VITALS — BP 122/68 | HR 76 | Ht 69.0 in | Wt 259.0 lb

## 2022-11-12 DIAGNOSIS — Z20822 Contact with and (suspected) exposure to covid-19: Secondary | ICD-10-CM | POA: Diagnosis not present

## 2022-11-12 DIAGNOSIS — C482 Malignant neoplasm of peritoneum, unspecified: Secondary | ICD-10-CM

## 2022-11-12 DIAGNOSIS — E1100 Type 2 diabetes mellitus with hyperosmolarity without nonketotic hyperglycemic-hyperosmolar coma (NKHHC): Secondary | ICD-10-CM | POA: Diagnosis not present

## 2022-11-12 LAB — POCT XPERT XPRESS SARS COVID-2/FLU/RSV
FLU A: NEGATIVE
FLU B: NEGATIVE
RSV RNA, PCR: NEGATIVE
SARS Coronavirus 2: NEGATIVE

## 2022-11-12 LAB — POCT CBG (FASTING - GLUCOSE)-MANUAL ENTRY: Glucose Fasting, POC: 113 mg/dL — AB (ref 70–99)

## 2022-11-12 NOTE — Progress Notes (Signed)
PET scan not allowed by insurance for staging. Chest CT ordered to complete staging.

## 2022-11-12 NOTE — Patient Instructions (Signed)
[email or web address]

## 2022-11-12 NOTE — Progress Notes (Signed)
Pharmacist Chemotherapy Monitoring - Initial Assessment    Anticipated start date: 11/14   The following has been reviewed per standard work regarding the patient's treatment regimen: The patient's diagnosis, treatment plan and drug doses, and organ/hematologic function Lab orders and baseline tests specific to treatment regimen  The treatment plan start date, drug sequencing, and pre-medications Prior authorization status  Patient's documented medication list, including drug-drug interaction screen and prescriptions for anti-emetics and supportive care specific to the treatment regimen The drug concentrations, fluid compatibility, administration routes, and timing of the medications to be used The patient's access for treatment and lifetime cumulative dose history, if applicable  The patient's medication allergies and previous infusion related reactions, if applicable   Changes made to treatment plan:  N/A  Follow up needed:  N/A   Sharen Hones, PharmD, BCPS Clinical Pharmacist   11/12/2022  2:02 PM

## 2022-11-13 ENCOUNTER — Other Ambulatory Visit: Payer: 59

## 2022-11-13 ENCOUNTER — Ambulatory Visit: Payer: 59

## 2022-11-13 ENCOUNTER — Encounter: Payer: 59 | Admitting: Licensed Clinical Social Worker

## 2022-11-13 ENCOUNTER — Other Ambulatory Visit: Payer: Self-pay

## 2022-11-13 ENCOUNTER — Ambulatory Visit: Payer: 59 | Admitting: Occupational Therapy

## 2022-11-14 ENCOUNTER — Inpatient Hospital Stay: Payer: 59

## 2022-11-14 ENCOUNTER — Encounter: Payer: Self-pay | Admitting: Licensed Clinical Social Worker

## 2022-11-14 ENCOUNTER — Ambulatory Visit
Admission: RE | Admit: 2022-11-14 | Discharge: 2022-11-14 | Disposition: A | Discharge status: Home / Self Care | Payer: 59 | Source: Ambulatory Visit | Attending: Oncology | Admitting: Oncology

## 2022-11-14 ENCOUNTER — Inpatient Hospital Stay (HOSPITAL_BASED_OUTPATIENT_CLINIC_OR_DEPARTMENT_OTHER): Payer: 59 | Admitting: Licensed Clinical Social Worker

## 2022-11-14 ENCOUNTER — Encounter: Payer: 59 | Admitting: Licensed Clinical Social Worker

## 2022-11-14 ENCOUNTER — Other Ambulatory Visit: Payer: 59

## 2022-11-14 DIAGNOSIS — Z801 Family history of malignant neoplasm of trachea, bronchus and lung: Secondary | ICD-10-CM

## 2022-11-14 DIAGNOSIS — Z800 Family history of malignant neoplasm of digestive organs: Secondary | ICD-10-CM | POA: Diagnosis not present

## 2022-11-14 DIAGNOSIS — C482 Malignant neoplasm of peritoneum, unspecified: Secondary | ICD-10-CM | POA: Insufficient documentation

## 2022-11-14 DIAGNOSIS — Z8051 Family history of malignant neoplasm of kidney: Secondary | ICD-10-CM

## 2022-11-14 DIAGNOSIS — R911 Solitary pulmonary nodule: Secondary | ICD-10-CM | POA: Diagnosis not present

## 2022-11-14 DIAGNOSIS — J90 Pleural effusion, not elsewhere classified: Secondary | ICD-10-CM | POA: Diagnosis not present

## 2022-11-14 DIAGNOSIS — Z8041 Family history of malignant neoplasm of ovary: Secondary | ICD-10-CM | POA: Diagnosis not present

## 2022-11-14 DIAGNOSIS — C786 Secondary malignant neoplasm of retroperitoneum and peritoneum: Secondary | ICD-10-CM | POA: Diagnosis not present

## 2022-11-14 DIAGNOSIS — Z808 Family history of malignant neoplasm of other organs or systems: Secondary | ICD-10-CM | POA: Diagnosis not present

## 2022-11-14 MED ORDER — IOHEXOL 300 MG/ML  SOLN
75.0000 mL | Freq: Once | INTRAMUSCULAR | Status: AC | PRN
  Administered 2022-11-14: 75 mL via INTRAVENOUS

## 2022-11-14 NOTE — Progress Notes (Signed)
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  Past medical history of DM, takotsubo cardiomyopathy, iron deficiency anemia, CAD, HLD, left foot BKA 09/24/22.  Planning to start carboplatin and pacilitaxel. Hospital admission noted and paracentesis drawn times 2 during admission.   Met with patient and spouse [name].  Reports poor po intake for the last 2 months.  Reports that she gets full quickly, has abdominal pain and swelling that effects her ability to eat.  Has had issues with nausea, dry heaves.  Taking nausea medications.  Having issues with slow moving bowels.  Patient drinking 1/2 protein shakes (ensure max protein and others).  Ate a few bites of steak, mashed potatoes and green beans last night.      Nutrition Focused Physical Exam:   Orbital Region: WNL Buccal Region: mild Upper Arm Region: WNL Thoracic and Lumbar Region: WNL Temple Region: WNL Clavicle Bone Region: WNL Shoulder and Acromion Bone Region: WNL Scapular Bone Region: WNL Dorsal Hand: WNL Patellar Region: WNL Anterior Thigh Region: WNL Posterior Calf Region: WNL, Left BKA Edema (RD assessment): none Hair: reviewed Eyes: reviewed Mouth: reviewed Skin: reviewed Nails: reviewed   Medications: NPH, Vit C, zinc, compazine, zofran, protonix, dexamethasone, metformin   Labs: Phos and Mag WNL   Anthropometrics:   Height: 69 inches Weight: 259 lb on 11/5 8/27/285 lb prior to  left BKA 269 lb 13.5 oz on 9/23 after BKA BMI: 38  4% weight loss in the last month, concerning   Estimated Energy Needs  Kcals: 2500-2900 Protein: 117-145 g Fluid: 2500-2900 ml   NUTRITION DIAGNOSIS: Inadequate oral intake related to cancer, abdominal pain, fullness as evidenced by 4% weight loss in the last month and eating less than 50% of estimated needs for > or equal to 5 days.    INTERVENTION:   Recommend small snack within 1 hour of waking then q 2 hours during the day.   Encouraged foods high in protein (ie crackers and peanut butter, protein shake, 1/2 meat sandwich). Discussed blander, lower fat foods to decrease risk of stomach upset.  Handout on foods to choose with nausea and vomiting provided Samples of ensure clear provided for patient to try.  Discussed that these shakes are higher in sugar and can dilute.  Needs to monitor blood glucose Contact information provided   MONITORING, EVALUATION, GOAL: weight trends, intake   Next Visit: Thursday, Nov 14 during infusion  Kenisha Lynds B. Freida Busman, RD, LDN Registered Dietitian [phone]

## 2022-11-14 NOTE — Progress Notes (Signed)
REFERRING PROVIDER: Rickard Patience, MD [address] [place],  [place] [address]  PRIMARY PROVIDER:  Miki Kins, FNP  PRIMARY REASON FOR VISIT:  1. Primary peritoneal carcinomatosis (HCC)   2. Family history of ovarian cancer   3. Family history of melanoma   4. Family history of colon cancer   5. Family history of lung cancer   6. Family history of kidney cancer      HISTORY OF PRESENT ILLNESS:   Nancy [name], a 57 y.o. female, was seen for a  cancer genetics consultation at the request of Dr. Cathie Hoops due to a personal and family history of cancer.  Nancy [name] presents to clinic today to discuss the possibility of a hereditary predisposition to cancer, genetic testing, and to further clarify her future cancer risks, as well as potential cancer risks for family members.   CANCER HISTORY:  Oncology History  Primary peritoneal carcinomatosis (HCC)  11/02/2022 Imaging   Large volume ascites in the abdomen or pelvis with omental caking.Appearance is concerning for peritoneal malignancy/carcinomatosis,often seen with ovarian cancer, but no ovarian mass visualized. Aortic atherosclerosis    11/06/2022 Initial Diagnosis   Primary peritoneal carcinomatosis   -presented to emergency room for evaluation of abdominal pain/bloating, poor oral intake, nonbloody nonbilious vomiting  Patient underwent paracentesis, cytology showed malignant cells. IHC can not be added.    11/22/2022 -  Chemotherapy   Patient is on Treatment Plan : Carboplatin + Paclitaxel q21d       RISK FACTORS:  Menarche was at age 47.  Ovaries intact: yes.  Hysterectomy: no.  Menopausal status: postmenopausal.  Colonoscopy: no; not examined. Mammogram within the last year: no.  Past Medical History:  Diagnosis Date   Abnormal cardiovascular stress test 09/21/2018   Formatting of this note might be different from the original. Lexiscan Myoview 09/16/2018 revealed mild anterior ischemia   Adverse effect  of motion 05/10/2014   Anxiety    Arthritis    r knee   Asthma    Breast cyst 05/10/2014   CAD (coronary artery disease)    a.) LHC 06/04/2009 at [facility]; non-obstructive CAD. b.) CTA with FFR 10/08/2018: extensive mixed plaque proximal to mid LAD (51-69%); Coronary Ca score 217; FFR 0.71 dPDA, 0.86 mLAD, 0.87 dLCx.   CCF (congestive cardiac failure) (HCC) 06/06/2008   a.) 30% EF. b.) TTE 06/03/2011: EF >55%; triv MR. c.) TTE 04/27/2018: EF 55%; mild LVH; triv PR, mild MR/TR; G1DD.   Chest pain with high risk for cardiac etiology 07/02/2016   Chronic use of opiate drug for therapeutic purpose 02/13/2022   Formatting of this note might be different from the original. [facility] Pain Contract signed on 04/18/16 & updated 03/04/17; UDS done on 04/18/16.   Complication of anesthesia    Diabetic foot infection (HCC) 03/13/2015   Diabetic foot ulcer (HCC) 02/08/2021   Eczema    Family history of adverse reaction to anesthesia    a.) PONV in mother and grandmother   GERD (gastroesophageal reflux disease) 05/10/2014   History of kidney stones    HLD (hyperlipidemia)    Hyponatremia 03/13/2015   Migraines    Motion sickness    Other acute osteomyelitis, left ankle and foot (HCC) 02/13/2022   Panic attacks    Peripheral edema 04/27/2018   Pneumonia    PONV (postoperative nausea and vomiting)    Sepsis secondary to diabetic foot infection 03/13/2015   T2DM (type 2 diabetes mellitus) (HCC)    Takotsubo cardiomyopathy / transient  apical balooning syndrome / stress-induced cardiomyopathy 05/10/2014   Unspecified essential hypertension     Past Surgical History:  Procedure Laterality Date   AMPUTATION Left 09/19/2022   Procedure: AMPUTATION BELOW KNEE;  Surgeon: Renford Dills, MD;  Location: [facility];  Service: Vascular;  Laterality: Left;   AMPUTATION Left 09/24/2022   Procedure: AMPUTATION BELOW KNEE WITH WOUND CLOSURE;  Surgeon: Renford Dills, MD;  Location: [facility];  Service: Vascular;   Laterality: Left;   AMPUTATION TOE Left 03/16/2015   Procedure: left fifth toe amputation with incision and drainage;  Surgeon: Gwyneth Revels, DPM;  Location: [facility];  Service: Podiatry;  Laterality: Left;   APPLICATION OF WOUND VAC Left 02/14/2021   Procedure: APPLICATION OF WOUND VAC;  Surgeon: Gwyneth Revels, DPM;  Location: [facility];  Service: Podiatry;  Laterality: Left;   APPLICATION OF WOUND VAC Left 04/16/2021   Procedure: APPLICATION OF WOUND VAC;  Surgeon: Peggye Form, DO;  Location: [facility];  Service: Plastics;  Laterality: Left;   CARDIAC CATHETERIZATION Left 06/04/2009   Procedure: CARDIAC CATHETERIZATION; Location: [facility]   CHOLECYSTECTOMY     DEBRIDEMENT AND CLOSURE WOUND Left 04/16/2021   Procedure: DEBRIDEMENT AND CLOSURE WOUND;  Surgeon: Peggye Form, DO;  Location: [facility];  Service: Plastics;  Laterality: Left;  1 hour   IRRIGATION AND DEBRIDEMENT FOOT Left 02/08/2021   Procedure: IRRIGATION AND DEBRIDEMENT FOOT - LFT HEEL ULCER;  Surgeon: Gwyneth Revels, DPM;  Location: [facility];  Service: Podiatry;  Laterality: Left;   IRRIGATION AND DEBRIDEMENT FOOT Left 02/14/2021   Procedure: IRRIGATION AND DEBRIDEMENT LEFT HEEL;  Surgeon: Gwyneth Revels, DPM;  Location: [facility];  Service: Podiatry;  Laterality: Left;   kidney stone removal     KNEE ARTHROSCOPY W/ MENISCAL REPAIR     NOSE SURGERY  01/07/1989   due to fracture   TEE WITHOUT CARDIOVERSION N/A 09/26/2022   Procedure: TRANSESOPHAGEAL ECHOCARDIOGRAM;  Surgeon: Clotilde Dieter, DO;  Location: [facility];  Service: Cardiovascular;  Laterality: N/A;   TOE AMPUTATION     second toe   TONSILLECTOMY     TOTAL KNEE ARTHROPLASTY  01/11/2011   Procedure: TOTAL KNEE ARTHROPLASTY;  Surgeon: Harvie Junior;  Location: [facility];  Service: Orthopedics;  Laterality: Right;  COMPUTER ASSISTED TOTAL KNEE REPLACEMENT    FAMILY HISTORY:  We obtained a detailed, 4-generation  family history.  Significant diagnoses are listed below: Family History  Problem Relation Age of Onset   Transient ischemic attack Mother    Asthma Mother    Hypertension Mother    Lung cancer Father 29   Hypertension Father    Asthma Sister    Anxiety disorder Sister    Asthma Sister    Hypertension Brother    Ovarian cancer Maternal Aunt 50   Melanoma Maternal Uncle        dx 53s   Lung cancer Paternal Aunt 66   Stomach cancer Maternal Grandmother    Colon cancer Maternal Grandmother    Lymphoma Maternal Grandmother    Melanoma Maternal Grandfather 72       sun exposure   Lung cancer Paternal Grandmother 38   Kidney cancer Paternal Grandfather        dx early   Crohn's disease Other    GER disease Other    Breast cancer Neg Hx    Nancy [name] has 1 full brother, 2 maternal half sisters. A nephew had Rothmund Thomson syndrome (possible RECQL4  mutation?), he had stomach and brain cancer and passed at 56.   Nancy [name] mother is living at 12. Maternal aunt died of ovarian cancer at 60. Maternal uncle had melanoma, he also had sun exposure from the [employer] and truck driving, he passed in his 53s. Maternal grandmother had colon and stomach cancer and lymphoma, and died at 48 of a stroke. Maternal grandfather had melanoma and passed of it at 49, he also had sun exposure.  Nancy [name]'s father died of metastatic lung cancer at 32, he had history of smoking. Paternal aunt also died of lung cancer, history of smoking. Her daughter, patient's cousin, had kidney cancer at 57. Paternal grandfather had kidney cancer early in life, but passed in his late 35s of heart issues. Paternal grandmother died at 73 of lung cancer.  Nancy [name] is unaware of previous family history of genetic testing for hereditary cancer risks. There is no reported Ashkenazi Jewish ancestry. There is no known consanguinity.    GENETIC COUNSELING ASSESSMENT: Nancy [name] is a 57 y.o. female with a  which is  somewhat suggestive of a hereditary cancer syndrome and predisposition to cancer. We, therefore, discussed and recommended the following at today's visit.   DISCUSSION: We discussed that approximately 10-20% of peritoneal cancer is hereditary. Most cases of hereditary peritoneal cancer are associated with BRCA1/BRCA2 genes, although there are other genes associated with hereditary cancer as well. Cancers and risks are gene specific. We discussed that testing is beneficial for several reasons including knowing about cancer risks, identifying potential screening and risk-reduction options that may be appropriate, and to understand if other family members could be at risk for cancer and allow them to undergo genetic testing.   We reviewed the characteristics, features and inheritance patterns of hereditary cancer syndromes. We also discussed genetic testing, including the appropriate family members to test, the process of testing, insurance coverage and turn-around-time for results. We discussed the implications of a negative, positive and/or variant of uncertain significant result. We recommended Nancy [name] pursue genetic testing for the [facility] CancerNext-Expanded+RNA gene panel.   Based on Nancy [name]'s personal and family history of cancer, she meets medical criteria for genetic testing. Despite that she meets criteria, she may still have an out of pocket cost.   PLAN: After considering the risks, benefits, and limitations, Nancy [name] provided informed consent to pursue genetic testing and the blood sample was sent to [facility] for analysis of the CancerNext-Expanded+RNA panel. Results should be available within approximately 2-3 weeks' time, at which point they will be disclosed by telephone to Nancy [name], as will any additional recommendations warranted by these results. Nancy [name] will receive a summary of her genetic counseling visit and a copy of her results once available. This  information will also be available in Epic.   Nancy [name] questions were answered to her satisfaction today. Our contact information was provided should additional questions or concerns arise. Thank you for the referral and allowing [place] to share in the care of your patient.   Lacy Duverney, MS, [facility] Genetic Counselor [email or web address] Phone: [phone]  The patient was seen for a total of 20 minutes in face-to-face genetic counseling. Patient's wife was also present.  Dr. Blake Divine was available for discussion regarding this case.   _______________________________________________________________________ For Office Staff:  Number of people involved in session: 3 Was an Intern/ student involved with case: yes

## 2022-11-15 ENCOUNTER — Other Ambulatory Visit: Payer: Self-pay | Admitting: Student

## 2022-11-15 DIAGNOSIS — Z01812 Encounter for preprocedural laboratory examination: Secondary | ICD-10-CM

## 2022-11-15 NOTE — Progress Notes (Signed)
Patient for CT guided Omentum Biopsy & IR Port Placment on Mon 11/18/22, I called and spoke with the patient on the phone and gave pre-procedure instructions. Pt was made aware to be here at 9a, last dose of ASA 81mg  was on Tues 11/12/22, NPO after MN prior to procedure as well as driver post procedure/recovery/discharge. Pt stated understanding.  Called 11/15/2022  Dr Juliette Alcide said ok to both of these on the same day.

## 2022-11-17 NOTE — H&P (Signed)
Chief Complaint: Recently diagnosed with peritoneal carcinomatosis. Found to have an omental mass. Team is requewtsing mental mass biopsy or further evaluation of metastases versus reproductive organ malignancy.  And a portacath for chemotherapy access.   Referring Physician(s): Yu,Zhou  Supervising Physician: {Supervising Physician:[id number]}  Patient Status: [facility] - Out-pt  History of Present Illness: Nancy [name] is a 57 y.o. female  Outpatient. History of HTN, DM, CHF, AKI, malnutrition, iron deficiency anemia. peritoneal carcinomatosis with malignant ascites. Found to have a omentum mass on CT Chest from 11.7.24. Team is requesting omental mass Biopsy for further evaluation of metastases versus reproductive organ malignancy and a portacath for chemotherapy access. Case approved by IR attending Dr. Elijio Miles El-Abd.  Currently without any significant complaints. Patient alert and laying in bed,calm. Denies any fevers, headache, chest pain, SOB, cough, abdominal pain, nausea, vomiting or bleeding. Return precautions and treatment recommendations and follow-up discussed with the patient *** who is agreeable with the plan.     Past Medical History:  Diagnosis Date   Abnormal cardiovascular stress test 09/21/2018   Formatting of this note might be different from the original. Lexiscan Myoview 09/16/2018 revealed mild anterior ischemia   Adverse effect of motion 05/10/2014   Anxiety    Arthritis    r knee   Asthma    Breast cyst 05/10/2014   CAD (coronary artery disease)    a.) LHC 06/04/2009 at [facility]; non-obstructive CAD. b.) CTA with FFR 10/08/2018: extensive mixed plaque proximal to mid LAD (51-69%); Coronary Ca score 217; FFR 0.71 dPDA, 0.86 mLAD, 0.87 dLCx.   CCF (congestive cardiac failure) (HCC) 06/06/2008   a.) 30% EF. b.) TTE 06/03/2011: EF >55%; triv MR. c.) TTE 04/27/2018: EF 55%; mild LVH; triv PR, mild MR/TR; G1DD.   Chest pain with high risk for cardiac etiology  07/02/2016   Chronic use of opiate drug for therapeutic purpose 02/13/2022   Formatting of this note might be different from the original. [facility] Pain Contract signed on 04/18/16 & updated 03/04/17; UDS done on 04/18/16.   Complication of anesthesia    Diabetic foot infection (HCC) 03/13/2015   Diabetic foot ulcer (HCC) 02/08/2021   Eczema    Family history of adverse reaction to anesthesia    a.) PONV in mother and grandmother   GERD (gastroesophageal reflux disease) 05/10/2014   History of kidney stones    HLD (hyperlipidemia)    Hyponatremia 03/13/2015   Migraines    Motion sickness    Other acute osteomyelitis, left ankle and foot (HCC) 02/13/2022   Panic attacks    Peripheral edema 04/27/2018   Pneumonia    PONV (postoperative nausea and vomiting)    Sepsis secondary to diabetic foot infection 03/13/2015   T2DM (type 2 diabetes mellitus) (HCC)    Takotsubo cardiomyopathy / transient apical balooning syndrome / stress-induced cardiomyopathy 05/10/2014   Unspecified essential hypertension     Past Surgical History:  Procedure Laterality Date   AMPUTATION Left 09/19/2022   Procedure: AMPUTATION BELOW KNEE;  Surgeon: Renford Dills, MD;  Location: [facility] ORS;  Service: Vascular;  Laterality: Left;   AMPUTATION Left 09/24/2022   Procedure: AMPUTATION BELOW KNEE WITH WOUND CLOSURE;  Surgeon: Renford Dills, MD;  Location: [facility] OR[facility];  Service: Vascular;  Laterality: Left;   AMPUTATION TOE Left 03/16/2015   Procedure: left fifth toe amputation with incision and drainage;  Surgeon: Gwyneth Revels, DPM;  Location: [facility] OR[facility];  Service: Podiatry;  Laterality: Left;   APPLICATION OF WOUND VAC Left  02/14/2021   Procedure: APPLICATION OF WOUND VAC;  Surgeon: Gwyneth Revels, DPM;  Location: [facility];  Service: Podiatry;  Laterality: Left;   APPLICATION OF WOUND VAC Left 04/16/2021   Procedure: APPLICATION OF WOUND VAC;  Surgeon: Peggye Form, DO;  Location: [facility];   Service: Plastics;  Laterality: Left;   CARDIAC CATHETERIZATION Left 06/04/2009   Procedure: CARDIAC CATHETERIZATION; Location: [facility]   CHOLECYSTECTOMY     DEBRIDEMENT AND CLOSURE WOUND Left 04/16/2021   Procedure: DEBRIDEMENT AND CLOSURE WOUND;  Surgeon: Peggye Form, DO;  Location: [facility];  Service: Plastics;  Laterality: Left;  1 hour   IRRIGATION AND DEBRIDEMENT FOOT Left 02/08/2021   Procedure: IRRIGATION AND DEBRIDEMENT FOOT - LFT HEEL ULCER;  Surgeon: Gwyneth Revels, DPM;  Location: [facility];  Service: Podiatry;  Laterality: Left;   IRRIGATION AND DEBRIDEMENT FOOT Left 02/14/2021   Procedure: IRRIGATION AND DEBRIDEMENT LEFT HEEL;  Surgeon: Gwyneth Revels, DPM;  Location: [facility];  Service: Podiatry;  Laterality: Left;   kidney stone removal     KNEE ARTHROSCOPY W/ MENISCAL REPAIR     NOSE SURGERY  01/07/1989   due to fracture   TEE WITHOUT CARDIOVERSION N/A 09/26/2022   Procedure: TRANSESOPHAGEAL ECHOCARDIOGRAM;  Surgeon: Clotilde Dieter, DO;  Location: [facility];  Service: Cardiovascular;  Laterality: N/A;   TOE AMPUTATION     second toe   TONSILLECTOMY     TOTAL KNEE ARTHROPLASTY  01/11/2011   Procedure: TOTAL KNEE ARTHROPLASTY;  Surgeon: Harvie Junior;  Location: [facility];  Service: Orthopedics;  Laterality: Right;  COMPUTER ASSISTED TOTAL KNEE REPLACEMENT    Allergies: Shellfish allergy, Codeine, Morphine and codeine, Percocet [oxycodone-acetaminophen], Rosuvastatin, Sulfa antibiotics, and Theophyllines  Medications: Prior to Admission medications   Medication Sig Start Date End Date Taking? Authorizing Provider  acidophilus (RISAQUAD) CAPS capsule Take 1 capsule by mouth daily.    [provider]  albuterol (PROVENTIL HFA;VENTOLIN HFA) 108 (90 Base) MCG/ACT inhaler Inhale 2 puffs into the lungs every 4 (four) hours as needed for wheezing or shortness of breath. Reported on 03/29/2015 03/29/15   Lorie Phenix, MD  albuterol  (PROVENTIL) (2.5 MG/3ML) 0.083% nebulizer solution Take 2.5 mg by nebulization as needed. 12/20/21   [provider]  ALPRAZolam Prudy Feeler) 0.5 MG tablet TAKE 1/2 TO 1 TABLET BY MOUTH AS NEEDED FOR PANIC ATTACKS 06/25/22   Miki Kins, FNP  amLODipine (NORVASC) 5 MG tablet Take 1 tablet (5 mg total) by mouth daily. Patient not taking: Reported on 11/06/2022 10/01/22   Marcelino Duster, MD  ascorbic acid (VITAMIN C) 500 MG tablet Take 1 tablet (500 mg total) by mouth 2 (two) times daily. 02/16/21   Glade Lloyd, MD  aspirin EC 81 MG tablet Take 81 mg by mouth daily.    [provider]  aspirin-acetaminophen-caffeine (EXCEDRIN MIGRAINE) [phone] MG tablet Take 2 tablets by mouth every 6 (six) hours as needed for migraine.    [provider]  carvedilol (COREG) 6.25 MG tablet TAKE 1 TABLET BY MOUTH TWICE A DAY 08/19/22   Miki Kins, FNP  Continuous Glucose Sensor (FREESTYLE LIBRE 2 SENSOR) MISC 1 each by Does not apply route every 14 (fourteen) days. 11/05/22   Miki Kins, FNP  desvenlafaxine (PRISTIQ) 100 MG 24 hr tablet TAKE 1 TABLET BY MOUTH EVERY DAY 10/18/22   Miki Kins, FNP  dexamethasone (DECADRON) 4 MG tablet Take 2 tablets (8mg ) by mouth daily starting the day after  carboplatin for 2 days. Take with food Patient not taking: Reported on 11/12/2022 11/06/22   Rickard Patience, MD  Ensure Max Protein (ENSURE MAX PROTEIN) LIQD Take 330 mLs (11 oz total) by mouth 2 (two) times daily. 09/30/22   Marcelino Duster, MD  fenofibrate 54 MG tablet TAKE 1 TABLET BY MOUTH EVERY DAY 03/11/22   Miki Kins, FNP  fluconazole (DIFLUCAN) 200 MG tablet Take 1 tablet (200 mg total) by mouth daily. Patient not taking: Reported on 11/12/2022 11/05/22   Miki Kins, FNP  HYDROmorphone (DILAUDID) 2 MG tablet Take 0.5 tablets (1 mg total) by mouth every 4 (four) hours as needed for severe pain (pain score 7-10) or moderate pain (pain score 4-6). 10/30/22    Wouk, Wilfred Curtis, MD  insulin NPH-regular Human (70-30) 100 UNIT/ML injection Inject 20-25 Units into the skin 2 (two) times daily with a meal.    [provider]  iron polysaccharides (NIFEREX) 150 MG capsule Take 1 capsule (150 mg total) by mouth daily. 10/01/22   Marcelino Duster, MD  lidocaine-prilocaine (EMLA) cream Apply to affected area once Patient not taking: Reported on 11/12/2022 11/06/22   Rickard Patience, MD  metFORMIN (GLUCOPHAGE) 500 MG tablet TAKE TWO TABLETS EACH MORNING. AND 3 TABLETS IN THE EVENING AS DIRECTED 09/17/22   Miki Kins, FNP  methocarbamol (ROBAXIN) 500 MG tablet Take 1 tablet (500 mg total) by mouth every 8 (eight) hours as needed for muscle spasms. 09/30/22   Marcelino Duster, MD  montelukast (SINGULAIR) 10 MG tablet TAKE 1 TABLET BY MOUTH EVERYDAY AT BEDTIME 09/17/22   Miki Kins, FNP  Multiple Vitamin (MULTIVITAMIN WITH MINERALS) TABS tablet Take 1 tablet by mouth daily. Patient not taking: Reported on 11/06/2022 10/01/22   Marcelino Duster, MD  nystatin (MYCOSTATIN/NYSTOP) powder APPLY TOPICALLY TWICE DAILY AS NEEDED 04/01/22   Miki Kins, FNP  nystatin ointment (MYCOSTATIN) Apply 1 Application topically 2 (two) times daily. 11/05/22   Miki Kins, FNP  ondansetron (ZOFRAN) 8 MG tablet Take 1 tablet (8 mg total) by mouth every 8 (eight) hours as needed for nausea or vomiting. Start on the third day after carboplatin. Patient not taking: Reported on 11/12/2022 11/06/22   Rickard Patience, MD  ondansetron (ZOFRAN-ODT) 8 MG disintegrating tablet Take 1 tablet (8 mg total) by mouth every 8 (eight) hours as needed for nausea or vomiting. 11/05/22   Miki Kins, FNP  pantoprazole (PROTONIX) 40 MG tablet TAKE 1 TABLET BY MOUTH EVERY DAY 03/12/22   Miki Kins, FNP  pravastatin (PRAVACHOL) 40 MG tablet TAKE 1 TABLET BY MOUTH EVERY DAY 08/23/22   Miki Kins, FNP  prochlorperazine (COMPAZINE) 10 MG tablet Take 1 tablet (10 mg  total) by mouth every 6 (six) hours as needed for nausea or vomiting. Patient not taking: Reported on 11/12/2022 11/06/22   Rickard Patience, MD  traMADol (ULTRAM) 50 MG tablet Take 50 mg by mouth every 6 (six) hours as needed (for migraines).    [provider]  zinc sulfate 220 (50 Zn) MG capsule Take 1 capsule (220 mg total) by mouth daily. 02/17/21   Glade Lloyd, MD     Family History  Problem Relation Age of Onset   Transient ischemic attack Mother    Asthma Mother    Hypertension Mother    Lung cancer Father 47   Hypertension Father    Asthma Sister    Anxiety disorder Sister    Asthma Sister    Hypertension Brother  Ovarian cancer Maternal Aunt 65   Melanoma Maternal Uncle        dx 60s   Lung cancer Paternal Aunt 70   Stomach cancer Maternal Grandmother    Colon cancer Maternal Grandmother    Lymphoma Maternal Grandmother    Melanoma Maternal Grandfather 72       sun exposure   Lung cancer Paternal Grandmother 29   Kidney cancer Paternal Grandfather        dx early   Crohn's disease Other    GER disease Other    Breast cancer Neg Hx     Social History   Socioeconomic History   Marital status: Married    Spouse name: Not on file   Number of children: Not on file   Years of education: Not on file   Highest education level: Not on file  Occupational History   Not on file  Tobacco Use   Smoking status: Former    Current packs/day: 0.00    Types: Cigarettes    Quit date: 03/12/1985    Years since quitting: 37.7   Smokeless tobacco: Never  Vaping Use   Vaping status: Never Used  Substance and Sexual Activity   Alcohol use: No   Drug use: No   Sexual activity: Yes    Birth control/protection: Post-menopausal  Other Topics Concern   Not on file  Social History Narrative   Full time. Does not regularly exercise. Domestic partner.    Social Determinants of Health   Financial Resource Strain: Not on file  Food Insecurity: No Food Insecurity  (10/22/2022)   Hunger Vital Sign    Worried About Running Out of Food in the Last Year: Never true    Ran Out of Food in the Last Year: Never true  Transportation Needs: No Transportation Needs (10/22/2022)   PRAPARE - Administrator, Civil Service (Medical): No    Lack of Transportation (Non-Medical): No  Physical Activity: Not on file  Stress: Not on file  Social Connections: Not on file    ECOG Status: {CHL ONC ECOG ZO:[id number]}  Review of Systems: A 12 point ROS discussed and pertinent positives are indicated in the HPI above.  All other systems are negative.  Review of Systems  Vital Signs: LMP 02/08/2013 (Within Months)     Physical Exam  Imaging: US Paracentesis  Result Date: 10/28/2022 INDICATION: Patient with peritoneal carcinomatosis with recurrent malignant ascites. Therapeutic paracentesis requested. EXAM: ULTRASOUND GUIDED PARACENTESIS MEDICATIONS: 1% lidocaine 10 mL COMPLICATIONS: None immediate. PROCEDURE: Informed written consent was obtained from the patient after a discussion of the risks, benefits and alternatives to treatment. A timeout was performed prior to the initiation of the procedure. Initial ultrasound scanning demonstrates a large amount of ascites within the left lower abdominal quadrant. The left lower abdomen was prepped and draped in the usual sterile fashion. 1% lidocaine was used for local anesthesia. Following this, a 19 gauge, 10-cm, Yueh catheter was introduced. An ultrasound image was saved for documentation purposes. The paracentesis was performed. The catheter was removed and a dressing was applied. The patient tolerated the procedure well without immediate post procedural complication. Patient received post-procedure intravenous albumin; see nursing notes for details. FINDINGS: A total of approximately 3.1 L of clear yellow fluid was removed. IMPRESSION: Successful ultrasound-guided paracentesis yielding 3.1 liters of peritoneal  fluid. Procedure performed by Alwyn Ren NP Electronically Signed   By: Malachy Moan M.D.   On: 10/28/2022 11:04   US Paracentesis  Result Date: 10/24/2022 INDICATION: Patient presents today with possible malignant ascites. Diagnostic and therapeutic paracentesis. EXAM: ULTRASOUND GUIDED PARACENTESIS MEDICATIONS: 1% lidocaine 10 mL COMPLICATIONS: None immediate. PROCEDURE: Informed written consent was obtained from the patient after a discussion of the risks, benefits and alternatives to treatment. A timeout was performed prior to the initiation of the procedure. Initial ultrasound scanning demonstrates a large amount of ascites within the left lower abdominal quadrant. The left lower abdomen was prepped and draped in the usual sterile fashion. 1% lidocaine was used for local anesthesia. Following this, a 19 gauge, 10-cm, Yueh catheter was introduced. An ultrasound image was saved for documentation purposes. The paracentesis was performed. The catheter was removed and a dressing was applied. The patient tolerated the procedure well without immediate post procedural complication. Patient received post-procedure intravenous albumin; see nursing notes for details. FINDINGS: A total of approximately 3.9 L of clear yellow fluid was removed. IMPRESSION: Successful ultrasound-guided paracentesis yielding 3.9 liters of peritoneal fluid. Procedure performed by Alwyn Ren NP Electronically Signed   By: Olive Bass M.D.   On: 10/24/2022 08:12   CT ABDOMEN PELVIS W CONTRAST  Result Date: 10/22/2022 CLINICAL DATA:  Abdominal pain, most pronounced in the left lower quadrant and suprapubic regions. Vomiting, fever. EXAM: CT ABDOMEN AND PELVIS WITH CONTRAST TECHNIQUE: Multidetector CT imaging of the abdomen and pelvis was performed using the standard protocol following bolus administration of intravenous contrast. RADIATION DOSE REDUCTION: This exam was performed according to the departmental  dose-optimization program which includes automated exposure control, adjustment of the mA and/or kV according to patient size and/or use of iterative reconstruction technique. CONTRAST:  OMNIPAQUE IOHEXOL 300 MG/ML  SOLN COMPARISON:  12/12/2004 FINDINGS: Lower chest: 6 no acute abnormality Hepatobiliary: No focal liver abnormality is seen. Status post cholecystectomy. No biliary dilatation. Pancreas: No focal abnormality or ductal dilatation. Spleen: No focal abnormality.  Normal size. Adrenals/Urinary Tract: No adrenal abnormality. No focal renal abnormality. No stones or hydronephrosis. Urinary bladder is unremarkable. Stomach/Bowel: Stomach, large and small bowel grossly unremarkable. Vascular/Lymphatic: Aortic atherosclerosis. No evidence of aneurysm or adenopathy. Reproductive: Uterus and adnexa unremarkable.  No mass. Other: Large volume ascites in the abdomen and pelvis. Omental thickening/caking noted anteriorly. Appearance is concerning for peritoneal carcinomatosis, often seen with ovarian cancer. However, no ovarian mass visualized. Musculoskeletal: No acute bony abnormality. IMPRESSION: Large volume ascites in the abdomen or pelvis with omental caking. Appearance is concerning for peritoneal malignancy/carcinomatosis, often seen with ovarian cancer, but no ovarian mass visualized. Aortic atherosclerosis. Electronically Signed   By: Charlett Nose M.D.   On: 10/22/2022 19:41    Labs:  CBC: Recent Labs    10/22/22 1526 10/23/22 0510 10/26/22 0807 10/29/22 0452  WBC 11.7* 9.7 10.2 9.4  HGB 9.8* 9.2* 10.2* 10.0*  HCT 31.4* 29.1* 32.5* 32.2*  PLT 552* 537* 689* 671*    COAGS: Recent Labs    09/18/22 1845  INR 1.5*  APTT 51*    BMP: Recent Labs    10/22/22 1526 10/23/22 0510 10/26/22 0807 10/29/22 0452  NA 133* 132* 134* 133*  K 5.1 4.8 5.5* 4.8  CL 97* 99 100 98  CO2 24 24 27 26   GLUCOSE 125* 127* 150* 147*  BUN 54* 48* 34* 33*  CALCIUM 8.8* 8.3* 8.9 8.7*  CREATININE  1.19* 1.13* 0.80 0.74  GFRNONAA 53* 57* >60 >60    LIVER FUNCTION TESTS: Recent Labs    09/18/22 1348 09/19/22 0417 10/22/22 1526 10/23/22 0510  BILITOT 1.0 0.9  0.6 0.5  AST 20 17 14* 13*  ALT 21 19 7 7   ALKPHOS 101 87 56 53  PROT 7.8 6.6 8.3* 7.3  ALBUMIN 2.9* 2.2* 3.0* 2.6*    Assessment and Plan:  57 y.o. Female. Outpatient. History of HTN, DM, CHF, AKI, malnutrition, iron deficiency anemia. peritoneal carcinomatosis with malignant ascites. Found to have a omentum mass on CT Chest from 11.7.24. Team is requesting omental mass Biopsy for further evaluation of metastases versus reproductive organ malignancy and a portacath for chemotherapy access. Case approved by IR attending Dr. Elijio Miles El-Abd.  Patient is on 81 mg of ASA. Last dose on 11.5.24. Allergies include codeine, shellfish, percocet and sulfa. Patient has been NPO since midnight.   Risks and benefits of omental mass biopsy was discussed with the patient and/or patient's family including, but not limited to bleeding, infection, damage to adjacent structures or low yield requiring additional tests.  Risks and benefits of image guided port-a-catheter placement was discussed with the patient including, but not limited to bleeding, infection, pneumothorax, or fibrin sheath development and need for additional procedures.  All of the patient's questions were answered, patient is agreeable to proceed.  Consent signed and in chart.   Thank you for this interesting consult.  I greatly enjoyed meeting ITATY STRENGTH and look forward to participating in their care.  A copy of this report was sent to the requesting provider on this date.  Electronically Signed: Alene Mires, NP 11/17/2022, 9:07 PM   I spent a total of {New ZOXW:[id number]} {New Out-Pt:[id number]}  {Established Out-Pt:[id number]} in face to face in clinical consultation, greater than 50% of which was counseling/coordinating care for ***

## 2022-11-18 ENCOUNTER — Other Ambulatory Visit: Payer: Self-pay

## 2022-11-18 ENCOUNTER — Ambulatory Visit
Admission: RE | Admit: 2022-11-18 | Discharge: 2022-11-18 | Disposition: A | Discharge status: Home / Self Care | Payer: 59 | Source: Ambulatory Visit | Attending: Oncology | Admitting: Oncology

## 2022-11-18 ENCOUNTER — Encounter: Payer: Self-pay | Admitting: Oncology

## 2022-11-18 DIAGNOSIS — E119 Type 2 diabetes mellitus without complications: Secondary | ICD-10-CM | POA: Insufficient documentation

## 2022-11-18 DIAGNOSIS — Z01812 Encounter for preprocedural laboratory examination: Secondary | ICD-10-CM

## 2022-11-18 DIAGNOSIS — I110 Hypertensive heart disease with heart failure: Secondary | ICD-10-CM | POA: Diagnosis not present

## 2022-11-18 DIAGNOSIS — Z452 Encounter for adjustment and management of vascular access device: Secondary | ICD-10-CM | POA: Diagnosis not present

## 2022-11-18 DIAGNOSIS — C481 Malignant neoplasm of specified parts of peritoneum: Secondary | ICD-10-CM | POA: Diagnosis not present

## 2022-11-18 DIAGNOSIS — D509 Iron deficiency anemia, unspecified: Secondary | ICD-10-CM | POA: Insufficient documentation

## 2022-11-18 DIAGNOSIS — C786 Secondary malignant neoplasm of retroperitoneum and peritoneum: Secondary | ICD-10-CM | POA: Diagnosis not present

## 2022-11-18 DIAGNOSIS — K668 Other specified disorders of peritoneum: Secondary | ICD-10-CM | POA: Diagnosis not present

## 2022-11-18 DIAGNOSIS — C482 Malignant neoplasm of peritoneum, unspecified: Secondary | ICD-10-CM

## 2022-11-18 DIAGNOSIS — I509 Heart failure, unspecified: Secondary | ICD-10-CM | POA: Diagnosis not present

## 2022-11-18 DIAGNOSIS — R188 Other ascites: Secondary | ICD-10-CM | POA: Diagnosis not present

## 2022-11-18 HISTORY — PX: IR IMAGING GUIDED PORT INSERTION: IMG5740

## 2022-11-18 LAB — CBC
HCT: 37.4 % (ref 36.0–46.0)
Hemoglobin: 11.7 g/dL — ABNORMAL LOW (ref 12.0–15.0)
MCH: 28.1 pg (ref 26.0–34.0)
MCHC: 31.3 g/dL (ref 30.0–36.0)
MCV: 89.9 fL (ref 80.0–100.0)
Platelets: 504 10*3/uL — ABNORMAL HIGH (ref 150–400)
RBC: 4.16 MIL/uL (ref 3.87–5.11)
RDW: 14.6 % (ref 11.5–15.5)
WBC: 11 10*3/uL — ABNORMAL HIGH (ref 4.0–10.5)
nRBC: 0 % (ref 0.0–0.2)

## 2022-11-18 LAB — PROTIME-INR
INR: 1.2 (ref 0.8–1.2)
Prothrombin Time: 15 s (ref 11.4–15.2)

## 2022-11-18 LAB — GLUCOSE, CAPILLARY: Glucose-Capillary: 108 mg/dL — ABNORMAL HIGH (ref 70–99)

## 2022-11-18 MED ORDER — MIDAZOLAM HCL 2 MG/2ML IJ SOLN
INTRAMUSCULAR | Status: AC
  Filled 2022-11-18: qty 2

## 2022-11-18 MED ORDER — MIDAZOLAM HCL 5 MG/5ML IJ SOLN
INTRAMUSCULAR | Status: AC | PRN
  Administered 2022-11-18 (×2): 1 mg via INTRAVENOUS

## 2022-11-18 MED ORDER — MIDAZOLAM HCL 2 MG/2ML IJ SOLN
INTRAMUSCULAR | Status: AC | PRN
  Administered 2022-11-18: 1 mg via INTRAVENOUS

## 2022-11-18 MED ORDER — FENTANYL CITRATE (PF) 100 MCG/2ML IJ SOLN
INTRAMUSCULAR | Status: AC
  Filled 2022-11-18: qty 2

## 2022-11-18 MED ORDER — HEPARIN SOD (PORK) LOCK FLUSH 100 UNIT/ML IV SOLN
500.0000 [IU] | Freq: Once | INTRAVENOUS | Status: AC
  Administered 2022-11-18: 500 [IU] via INTRAVENOUS

## 2022-11-18 MED ORDER — SODIUM CHLORIDE 0.9 % IV SOLN: INTRAVENOUS | Status: DC

## 2022-11-18 MED ORDER — LIDOCAINE-EPINEPHRINE 1 %-1:100000 IJ SOLN
INTRAMUSCULAR | Status: AC
  Filled 2022-11-18: qty 1

## 2022-11-18 MED ORDER — FENTANYL CITRATE (PF) 100 MCG/2ML IJ SOLN
INTRAMUSCULAR | Status: AC | PRN
  Administered 2022-11-18: 25 ug via INTRAVENOUS
  Administered 2022-11-18 (×2): 50 ug via INTRAVENOUS

## 2022-11-18 MED ORDER — FENTANYL CITRATE (PF) 100 MCG/2ML IJ SOLN
INTRAMUSCULAR | Status: AC | PRN
  Administered 2022-11-18: 50 ug via INTRAVENOUS

## 2022-11-18 MED ORDER — LIDOCAINE 1 % OPTIME INJ - NO CHARGE
10.0000 mL | Freq: Once | INTRAMUSCULAR | Status: DC
  Filled 2022-11-18: qty 10

## 2022-11-18 MED ORDER — HEPARIN SOD (PORK) LOCK FLUSH 100 UNIT/ML IV SOLN
INTRAVENOUS | Status: AC
  Filled 2022-11-18: qty 5

## 2022-11-18 MED ORDER — LIDOCAINE-EPINEPHRINE 1 %-1:100000 IJ SOLN
14.0000 mL | Freq: Once | INTRAMUSCULAR | Status: AC
  Administered 2022-11-18: 14 mL via INTRADERMAL

## 2022-11-19 ENCOUNTER — Encounter: Payer: Self-pay | Admitting: Oncology

## 2022-11-19 ENCOUNTER — Encounter: Payer: 59 | Admitting: Physical Medicine and Rehabilitation

## 2022-11-20 ENCOUNTER — Encounter: Payer: Self-pay | Admitting: Infectious Diseases

## 2022-11-20 ENCOUNTER — Other Ambulatory Visit (HOSPITAL_BASED_OUTPATIENT_CLINIC_OR_DEPARTMENT_OTHER): Payer: 59

## 2022-11-20 DIAGNOSIS — C482 Malignant neoplasm of peritoneum, unspecified: Secondary | ICD-10-CM

## 2022-11-20 LAB — SURGICAL PATHOLOGY

## 2022-11-20 MED FILL — Fosaprepitant Dimeglumine For IV Infusion 150 MG (Base Eq): INTRAVENOUS | Qty: 5 | Status: AC

## 2022-11-20 NOTE — Progress Notes (Signed)
 Tumor Board Documentation  Nancy [name] was presented by Dr Nancy [name] at our Tumor Board on 11/20/2022, which included representatives from medical oncology, pathology, navigation, radiation oncology, surgical oncology, palliative care.  Nancy [name] currently presents as a new patient with history of the following treatments: none.  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 HRD/BRCA recommended to determine if parp inhibitor vs if negative, consider bevacizumab either neoadjuvant or adjuvant setting- no long term survival benefit.   Additionally, we reviewed previous medical and familial history, history of present illness, and recent lab results along with all available histopathologic and imaging studies. The tumor board considered available treatment options and made the following recommendations:      The following procedures/referrals were also placed: No orders of the defined types were placed in this encounter.   Clinical Trial Status: not discussed   Staging used:    National site-specific guidelines   were discussed with respect to the case.  Tumor board is a meeting of clinicians from various specialty areas who evaluate and discuss patients for whom a multidisciplinary approach is being considered. Final determinations in the plan of care are those of the provider(s). The responsibility for follow up of recommendations given during tumor board is that of the provider.   Today's extended care, comprehensive team conference, Nancy [name] was not present for the discussion and was not examined.

## 2022-11-21 ENCOUNTER — Encounter: Payer: Self-pay | Admitting: Oncology

## 2022-11-21 ENCOUNTER — Inpatient Hospital Stay: Payer: 59

## 2022-11-21 ENCOUNTER — Telehealth: Payer: Self-pay

## 2022-11-21 ENCOUNTER — Inpatient Hospital Stay (HOSPITAL_BASED_OUTPATIENT_CLINIC_OR_DEPARTMENT_OTHER): Payer: 59 | Admitting: Oncology

## 2022-11-21 ENCOUNTER — Other Ambulatory Visit: Payer: Self-pay

## 2022-11-21 VITALS — BP 137/67 | HR 68 | Temp 95.9°F | Resp 16

## 2022-11-21 VITALS — BP 158/83 | Temp 96.1°F | Resp 18 | Wt 269.2 lb

## 2022-11-21 DIAGNOSIS — C482 Malignant neoplasm of peritoneum, unspecified: Secondary | ICD-10-CM

## 2022-11-21 DIAGNOSIS — Z5111 Encounter for antineoplastic chemotherapy: Secondary | ICD-10-CM

## 2022-11-21 DIAGNOSIS — Z87891 Personal history of nicotine dependence: Secondary | ICD-10-CM | POA: Diagnosis not present

## 2022-11-21 DIAGNOSIS — Z794 Long term (current) use of insulin: Secondary | ICD-10-CM | POA: Diagnosis not present

## 2022-11-21 DIAGNOSIS — E440 Moderate protein-calorie malnutrition: Secondary | ICD-10-CM | POA: Diagnosis not present

## 2022-11-21 DIAGNOSIS — R180 Malignant ascites: Secondary | ICD-10-CM

## 2022-11-21 DIAGNOSIS — E1165 Type 2 diabetes mellitus with hyperglycemia: Secondary | ICD-10-CM

## 2022-11-21 DIAGNOSIS — D508 Other iron deficiency anemias: Secondary | ICD-10-CM | POA: Diagnosis not present

## 2022-11-21 DIAGNOSIS — D509 Iron deficiency anemia, unspecified: Secondary | ICD-10-CM | POA: Diagnosis not present

## 2022-11-21 DIAGNOSIS — E46 Unspecified protein-calorie malnutrition: Secondary | ICD-10-CM | POA: Diagnosis not present

## 2022-11-21 DIAGNOSIS — G893 Neoplasm related pain (acute) (chronic): Secondary | ICD-10-CM | POA: Diagnosis not present

## 2022-11-21 LAB — CBC WITH DIFFERENTIAL (CANCER CENTER ONLY)
Abs Immature Granulocytes: 0.05 10*3/uL (ref 0.00–0.07)
Basophils Absolute: 0.1 10*3/uL (ref 0.0–0.1)
Basophils Relative: 1 %
Eosinophils Absolute: 0.7 10*3/uL — ABNORMAL HIGH (ref 0.0–0.5)
Eosinophils Relative: 7 %
HCT: 34.7 % — ABNORMAL LOW (ref 36.0–46.0)
Hemoglobin: 10.8 g/dL — ABNORMAL LOW (ref 12.0–15.0)
Immature Granulocytes: 1 %
Lymphocytes Relative: 25 %
Lymphs Abs: 2.5 10*3/uL (ref 0.7–4.0)
MCH: 28 pg (ref 26.0–34.0)
MCHC: 31.1 g/dL (ref 30.0–36.0)
MCV: 89.9 fL (ref 80.0–100.0)
Monocytes Absolute: 0.7 10*3/uL (ref 0.1–1.0)
Monocytes Relative: 8 %
Neutro Abs: 5.9 10*3/uL (ref 1.7–7.7)
Neutrophils Relative %: 58 %
Platelet Count: 431 10*3/uL — ABNORMAL HIGH (ref 150–400)
RBC: 3.86 MIL/uL — ABNORMAL LOW (ref 3.87–5.11)
RDW: 14.4 % (ref 11.5–15.5)
WBC Count: 9.9 10*3/uL (ref 4.0–10.5)
nRBC: 0 % (ref 0.0–0.2)

## 2022-11-21 LAB — CMP (CANCER CENTER ONLY)
ALT: 12 U/L (ref 0–44)
AST: 16 U/L (ref 15–41)
Albumin: 3 g/dL — ABNORMAL LOW (ref 3.5–5.0)
Alkaline Phosphatase: 43 U/L (ref 38–126)
Anion gap: 9 (ref 5–15)
BUN: 25 mg/dL — ABNORMAL HIGH (ref 6–20)
CO2: 24 mmol/L (ref 22–32)
Calcium: 8.8 mg/dL — ABNORMAL LOW (ref 8.9–10.3)
Chloride: 103 mmol/L (ref 98–111)
Creatinine: 1.01 mg/dL — ABNORMAL HIGH (ref 0.44–1.00)
GFR, Estimated: >60 mL/min (ref >60)
Glucose, Bld: 121 mg/dL — ABNORMAL HIGH (ref 70–99)
Potassium: 4.6 mmol/L (ref 3.5–5.1)
Sodium: 136 mmol/L (ref 135–145)
Total Bilirubin: 0.2 mg/dL (ref <1.2)
Total Protein: 7.4 g/dL (ref 6.5–8.1)

## 2022-11-21 LAB — RETIC PANEL
Immature Retic Fract: 18.8 % — ABNORMAL HIGH (ref 2.3–15.9)
RBC.: 3.87 MIL/uL (ref 3.87–5.11)
Retic Count, Absolute: 54.6 10*3/uL (ref 19.0–186.0)
Retic Ct Pct: 1.4 % (ref 0.4–3.1)
Reticulocyte Hemoglobin: 28.4 pg (ref >27.9)

## 2022-11-21 MED ORDER — OXYCODONE HCL 5 MG PO TABS: 5.0000 mg | ORAL_TABLET | Freq: Four times a day (QID) | ORAL | 0 refills | Status: DC | PRN

## 2022-11-21 MED ORDER — FAMOTIDINE IN NACL 20-0.9 MG/50ML-% IV SOLN
20.0000 mg | Freq: Once | INTRAVENOUS | Status: AC
Start: 2022-11-21 — End: 2022-11-21
  Administered 2022-11-21: 20 mg via INTRAVENOUS
  Filled 2022-11-21: qty 50

## 2022-11-21 MED ORDER — OXYCODONE HCL 5 MG PO TABS
5.0000 mg | ORAL_TABLET | Freq: Once | ORAL | Status: AC
  Administered 2022-11-21: 5 mg via ORAL
  Filled 2022-11-21: qty 1

## 2022-11-21 MED ORDER — DIPHENHYDRAMINE HCL 50 MG/ML IJ SOLN
50.0000 mg | Freq: Once | INTRAMUSCULAR | Status: AC
Start: 2022-11-21 — End: 2022-11-21
  Administered 2022-11-21: 50 mg via INTRAVENOUS
  Filled 2022-11-21: qty 1

## 2022-11-21 MED ORDER — SODIUM CHLORIDE 0.9 % IV SOLN
175.0000 mg/m2 | Freq: Once | INTRAVENOUS | Status: AC
  Administered 2022-11-21: 360 mg via INTRAVENOUS
  Filled 2022-11-21: qty 60

## 2022-11-21 MED ORDER — SODIUM CHLORIDE 0.9 % IV SOLN
694.0000 mg | Freq: Once | INTRAVENOUS | Status: AC
  Administered 2022-11-21: 690 mg via INTRAVENOUS
  Filled 2022-11-21: qty 69

## 2022-11-21 MED ORDER — HEPARIN SOD (PORK) LOCK FLUSH 100 UNIT/ML IV SOLN
500.0000 [IU] | Freq: Once | INTRAVENOUS | Status: AC | PRN
  Administered 2022-11-21: 500 [IU]
  Filled 2022-11-21: qty 5

## 2022-11-21 MED ORDER — PALONOSETRON HCL INJECTION 0.25 MG/5ML
0.2500 mg | Freq: Once | INTRAVENOUS | Status: AC
Start: 2022-11-21 — End: 2022-11-21
  Administered 2022-11-21: 0.25 mg via INTRAVENOUS
  Filled 2022-11-21: qty 5

## 2022-11-21 MED ORDER — DEXAMETHASONE SODIUM PHOSPHATE 10 MG/ML IJ SOLN
10.0000 mg | Freq: Once | INTRAMUSCULAR | Status: AC
  Administered 2022-11-21: 10 mg via INTRAVENOUS
  Filled 2022-11-21: qty 1

## 2022-11-21 MED ORDER — APIXABAN 2.5 MG PO TABS: 2.5000 mg | ORAL_TABLET | Freq: Two times a day (BID) | ORAL | 2 refills | Status: DC

## 2022-11-21 MED ORDER — SODIUM CHLORIDE 0.9 % IV SOLN
INTRAVENOUS | Status: DC
Start: 2022-11-21 — End: 2022-11-21
  Filled 2022-11-21: qty 250

## 2022-11-21 MED ORDER — FOSAPREPITANT DIMEGLUMINE INJECTION 150 MG
150.0000 mg | Freq: Once | INTRAVENOUS | Status: AC
  Administered 2022-11-21: 150 mg via INTRAVENOUS
  Filled 2022-11-21: qty 150

## 2022-11-21 NOTE — Telephone Encounter (Signed)
[facility] CDx ordered on [id number], omental biopsy, collected 11/18/2022.

## 2022-11-21 NOTE — Assessment & Plan Note (Addendum)
Malignant ascites. CA125 is significantly elevated in 700s.  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  Obtain HRD testing. Case was discussed on gynonc TB on 11/13. I will not add Bevacizumab neoadjuvantly given her recent surgery, Labs are reviewed and discussed with patient. Proceed with cycle 1 carboplatin and taxol.   Khorana score 3  Recommend to start Eliquis 2.5mg  BID, continue Aspirin 81mg  daily.

## 2022-11-21 NOTE — Assessment & Plan Note (Signed)
Refer to establish care with nutritionist.

## 2022-11-21 NOTE — Assessment & Plan Note (Addendum)
Stop Tramadol as she feels it is not working. stop dilaudid - she reports not using.  Recommend oxycodone 5mg  Q6h PRN pain.   Patient verbalized understanding that medications should not be sold or shared, taken with alcohol, or used while driving. Patient educated that medications should not be bitten, chewed, or crushed. patient has been made aware of the sside effects and treatment/ prevention  of constipation,  respiratory depression,  and mental status changes, even lead to overdose that may result in death, if used outside of the parameters that we discussed.   Refer to palliative care.

## 2022-11-21 NOTE — Patient Instructions (Signed)
 [facility] - A DEPT OF [facility]  Discharge Instructions: Thank you for choosing [facility] to provide your oncology and hematology care.  If you have a lab appointment with the Cancer Center, please go directly to the Cancer Center and check in at the registration area.  Wear comfortable clothing and clothing appropriate for easy access to any Portacath or PICC line.   We strive to give you quality time with your provider. You may need to reschedule your appointment if you arrive late (15 or more minutes).  Arriving late affects you and other patients whose appointments are after yours.  Also, if you miss three or more appointments without notifying the office, you may be dismissed from the clinic at the provider's discretion.      For prescription refill requests, have your pharmacy contact our office and allow 72 hours for refills to be completed.    To help prevent nausea and vomiting after your treatment, we encourage you to take your nausea medication as directed.  BELOW ARE SYMPTOMS THAT SHOULD BE REPORTED IMMEDIATELY: *FEVER GREATER THAN 100.4 F (38 C) OR HIGHER *CHILLS OR SWEATING *NAUSEA AND VOMITING THAT IS NOT CONTROLLED WITH YOUR NAUSEA MEDICATION *UNUSUAL SHORTNESS OF BREATH *UNUSUAL BRUISING OR BLEEDING *URINARY PROBLEMS (pain or burning when urinating, or frequent urination) *BOWEL PROBLEMS (unusual diarrhea, constipation, pain near the anus) TENDERNESS IN MOUTH AND THROAT WITH OR WITHOUT PRESENCE OF ULCERS (sore throat, sores in mouth, or a toothache) UNUSUAL RASH, SWELLING OR PAIN  UNUSUAL VAGINAL DISCHARGE OR ITCHING   Items with * indicate a potential emergency and should be followed up as soon as possible or go to the Emergency Department if any problems should occur.  Please show the CHEMOTHERAPY ALERT CARD or IMMUNOTHERAPY ALERT CARD at check-in to the Emergency Department and triage nurse.  Should you have  questions after your visit or need to cancel or reschedule your appointment, please contact [facility] - A DEPT OF [facility]  [phone] and follow the prompts.  Office hours are 8:00 a.m. to 4:30 p.m. Monday - Friday. Please note that voicemails left after 4:00 p.m. may not be returned until the following business day.  We are closed weekends and major holidays. You have access to a nurse at all times for urgent questions. Please call the main number to the clinic [phone] and follow the prompts.  For any non-urgent questions, you may also contact your provider using MyChart. We now offer e-Visits for anyone 57 and older to request care online for non-urgent symptoms. For details visit [email or web address].   Also download the MyChart app! Go to the app store, search "MyChart", open the app, select [place], and log in with your MyChart username and password.

## 2022-11-21 NOTE — Assessment & Plan Note (Signed)
Encourage patient to adhere to diabetic diet and current diabetes regimen.  Glucose may be elevated due to steroid use during chemotherapy.

## 2022-11-21 NOTE — Assessment & Plan Note (Signed)
Chemotherapy treatment as planned. 

## 2022-11-21 NOTE — Assessment & Plan Note (Addendum)
Lab Results  Component Value Date   HGB 10.8 (L) 11/21/2022   TIBC 195 (L) 09/19/2022   IRONPCTSAT 7 (L) 09/19/2022   FERRITIN 314 (H) 09/19/2022    Recommend oral iron supplementation.

## 2022-11-21 NOTE — Progress Notes (Signed)
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  Starting carboplatin, paclitaxel today.    Met with patient during infusion. Reports that she has been trying to eat small snacks/meals during the day. Tried the ensure clear and able to drink it.  Does not like the "milky" shakes at too thick.  Reports that bowels are moving.    Medications: reviewed  Labs: reviewed  Anthropometrics:   Weight 269 lb 3.2 oz  259 lb on 11/5 274 lb on 09/18/22   NUTRITION DIAGNOSIS: Inadequate oral intake ongoing    INTERVENTION:  Continue small frequent snacks/meals. Continue oral nutrition supplement as able    MONITORING, EVALUATION, GOAL: weight trends, intake   NEXT VISIT: Friday, Dec 6 during infusion  Dewaun Kinzler B. Freida Busman, RD, LDN Registered Dietitian [phone]

## 2022-11-21 NOTE — Progress Notes (Signed)
Hematology/Oncology Progress note [phone] [phone]) [phone]      CHIEF COMPLAINTS/PURPOSE OF CONSULTATION:  Primary peritoneal carcinomatosis  ASSESSMENT & PLAN:   Primary peritoneal carcinomatosis (HCC) Malignant ascites. CA125 is significantly elevated in 700s.  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  Obtain HRD testing. Case was discussed on gynonc TB on 11/13. I will not add Bevacizumab neoadjuvantly given her recent surgery, Labs are reviewed and discussed with patient. Proceed with cycle 1 carboplatin and taxol.   Khorana score 3  Recommend to start Eliquis 2.5mg  BID, continue Aspirin 81mg  daily.   Protein calorie malnutrition (HCC) Refer to establish care with nutritionist.   IDA (iron deficiency anemia) Lab Results  Component Value Date   HGB 10.8 (L) 11/21/2022   TIBC 195 (L) 09/19/2022   IRONPCTSAT 7 (L) 09/19/2022   FERRITIN 314 (H) 09/19/2022    Recommend oral iron supplementation.   Diabetes mellitus, type 2 (HCC) Encourage patient to adhere to diabetic diet and current diabetes regimen.  Glucose may be elevated due to steroid use during chemotherapy.   Encounter for antineoplastic chemotherapy Chemotherapy treatment as planned.   Neoplasm related pain Stop Tramadol as she feels it is not working. stop dilaudid - she reports not using.  Recommend oxycodone 5mg  Q6h PRN pain.   Patient verbalized understanding that medications should not be sold or shared, taken with alcohol, or used while driving. Patient educated that medications should not be bitten, chewed, or crushed. patient has been made aware of the sside effects and treatment/ prevention  of constipation,  respiratory depression,  and mental status changes, even lead to overdose that may result in death, if used outside of the parameters that we discussed.   Refer to palliative care.     Orders Placed This Encounter  Procedures   CMP (Cancer Center only)    Standing Status:   Future    Standing Expiration Date:   11/21/2023   CBC with Differential (Cancer Center Only)    Standing Status:   Future    Standing Expiration Date:   11/21/2023   CA 125    Standing Status:   Future    Standing Expiration Date:   12/13/2023   CBC with Differential (Cancer Center Only)    Standing Status:   Future    Standing Expiration Date:   12/13/2023   CMP (Cancer Center only)    Standing Status:   Future    Standing Expiration Date:   12/13/2023   Ambulatory Referral to Palliative Care    Referral Priority:   Routine    Referral Type:   Consultation    Referral Reason:   Goals of Care    Number of Visits Requested:   1   Follow up 1 week to evaluate tolerability.   All questions were answered. The patient knows to call the clinic with any problems, questions or concerns.  Rickard Patience, MD, PhD [facility] Hematology Oncology 11/21/2022    HISTORY OF PRESENTING ILLNESS:  Nancy [name] 57 y.o. female presents to establish care for  peritoneal carcinomatosis I have reviewed her chart and materials related to her cancer extensively and collaborated history with the patient. Summary of oncologic history is as follows: Oncology History  Primary peritoneal carcinomatosis (HCC)  11/02/2022 Imaging   Large volume ascites in the abdomen or pelvis with omental caking.Appearance is concerning for peritoneal malignancy/carcinomatosis,often seen with ovarian cancer, but no ovarian mass  visualized. Aortic atherosclerosis    11/06/2022 Initial Diagnosis   Primary peritoneal carcinomatosis   -presented to emergency room for evaluation of abdominal pain/bloating, poor oral intake, nonbloody nonbilious vomiting  Patient underwent paracentesis, cytology showed malignant cells. IHC can not be added.    11/21/2022 -  Chemotherapy   Patient is on Treatment Plan : Carboplatin + Paclitaxel  q21d     History of sepsis secondary to diabetic foot infection/osteomyelitis status post left BKA done on 09/24/2022,    Today she presents to for evaluation prior to chemotherapy. Accompanied by significant other.  Abdominal distension has improved after paracentesis x 2 times during her hospitalization.  + abdominal pressure/pain, she took tramadol which did not relieve her pain.     MEDICAL HISTORY:  Past Medical History:  Diagnosis Date   Abnormal cardiovascular stress test 09/21/2018   Formatting of this note might be different from the original. Lexiscan Myoview 09/16/2018 revealed mild anterior ischemia   Adverse effect of motion 05/10/2014   Anxiety    Arthritis    r knee   Asthma    Breast cyst 05/10/2014   CAD (coronary artery disease)    a.) LHC 06/04/2009 at [facility]; non-obstructive CAD. b.) CTA with FFR 10/08/2018: extensive mixed plaque proximal to mid LAD (51-69%); Coronary Ca score 217; FFR 0.71 dPDA, 0.86 mLAD, 0.87 dLCx.   CCF (congestive cardiac failure) (HCC) 06/06/2008   a.) 30% EF. b.) TTE 06/03/2011: EF >55%; triv MR. c.) TTE 04/27/2018: EF 55%; mild LVH; triv PR, mild MR/TR; G1DD.   Chest pain with high risk for cardiac etiology 07/02/2016   Chronic use of opiate drug for therapeutic purpose 02/13/2022   Formatting of this note might be different from the original. [employer] Pain Contract signed on 04/18/16 & updated 03/04/17; UDS done on 04/18/16.   Complication of anesthesia    Diabetic foot infection (HCC) 03/13/2015   Diabetic foot ulcer (HCC) 02/08/2021   Eczema    Family history of adverse reaction to anesthesia    a.) PONV in mother and grandmother   GERD (gastroesophageal reflux disease) 05/10/2014   History of kidney stones    HLD (hyperlipidemia)    Hyponatremia 03/13/2015   Migraines    Motion sickness    Other acute osteomyelitis, left ankle and foot (HCC) 02/13/2022   Panic attacks    Peripheral edema 04/27/2018   Pneumonia    PONV (postoperative  nausea and vomiting)    Sepsis secondary to diabetic foot infection 03/13/2015   T2DM (type 2 diabetes mellitus) (HCC)    Takotsubo cardiomyopathy / transient apical balooning syndrome / stress-induced cardiomyopathy 05/10/2014   Unspecified essential hypertension     SURGICAL HISTORY: Past Surgical History:  Procedure Laterality Date   AMPUTATION Left 09/19/2022   Procedure: AMPUTATION BELOW KNEE;  Surgeon: Renford Dills, MD;  Location: [facility];  Service: Vascular;  Laterality: Left;   AMPUTATION Left 09/24/2022   Procedure: AMPUTATION BELOW KNEE WITH WOUND CLOSURE;  Surgeon: Renford Dills, MD;  Location: [facility];  Service: Vascular;  Laterality: Left;   AMPUTATION TOE Left 03/16/2015   Procedure: left fifth toe amputation with incision and drainage;  Surgeon: Gwyneth Revels, DPM;  Location: [facility];  Service: Podiatry;  Laterality: Left;   APPLICATION OF WOUND VAC Left 02/14/2021   Procedure: APPLICATION OF WOUND VAC;  Surgeon: Gwyneth Revels, DPM;  Location: [facility];  Service: Podiatry;  Laterality: Left;   APPLICATION OF WOUND VAC Left 04/16/2021   Procedure: APPLICATION OF  WOUND VAC;  Surgeon: Peggye Form, DO;  Location: [facility];  Service: Plastics;  Laterality: Left;   CARDIAC CATHETERIZATION Left 06/04/2009   Procedure: CARDIAC CATHETERIZATION; Location: [facility]   CHOLECYSTECTOMY     DEBRIDEMENT AND CLOSURE WOUND Left 04/16/2021   Procedure: DEBRIDEMENT AND CLOSURE WOUND;  Surgeon: Peggye Form, DO;  Location: [facility];  Service: Plastics;  Laterality: Left;  1 hour   IR IMAGING GUIDED PORT INSERTION  11/18/2022   IRRIGATION AND DEBRIDEMENT FOOT Left 02/08/2021   Procedure: IRRIGATION AND DEBRIDEMENT FOOT - LFT HEEL ULCER;  Surgeon: Gwyneth Revels, DPM;  Location: [facility];  Service: Podiatry;  Laterality: Left;   IRRIGATION AND DEBRIDEMENT FOOT Left 02/14/2021   Procedure: IRRIGATION AND DEBRIDEMENT LEFT  HEEL;  Surgeon: Gwyneth Revels, DPM;  Location: [facility];  Service: Podiatry;  Laterality: Left;   kidney stone removal     KNEE ARTHROSCOPY W/ MENISCAL REPAIR     NOSE SURGERY  01/07/1989   due to fracture   TEE WITHOUT CARDIOVERSION N/A 09/26/2022   Procedure: TRANSESOPHAGEAL ECHOCARDIOGRAM;  Surgeon: Clotilde Dieter, DO;  Location: [facility];  Service: Cardiovascular;  Laterality: N/A;   TOE AMPUTATION     second toe   TONSILLECTOMY     TOTAL KNEE ARTHROPLASTY  01/11/2011   Procedure: TOTAL KNEE ARTHROPLASTY;  Surgeon: Harvie Junior;  Location: [facility];  Service: Orthopedics;  Laterality: Right;  COMPUTER ASSISTED TOTAL KNEE REPLACEMENT    SOCIAL HISTORY: Social History   Socioeconomic History   Marital status: Married    Spouse name: Not on file   Number of children: Not on file   Years of education: Not on file   Highest education level: Not on file  Occupational History   Not on file  Tobacco Use   Smoking status: Former    Current packs/day: 0.00    Types: Cigarettes    Quit date: 03/12/1985    Years since quitting: 37.7   Smokeless tobacco: Never  Vaping Use   Vaping status: Never Used  Substance and Sexual Activity   Alcohol use: No   Drug use: No   Sexual activity: Yes    Birth control/protection: Post-menopausal  Other Topics Concern   Not on file  Social History Narrative   Wife, [name], at bedside. One indoor pet, dog.   Social Determinants of Health   Financial Resource Strain: Not on file  Food Insecurity: No Food Insecurity (10/22/2022)   Hunger Vital Sign    Worried About Running Out of Food in the Last Year: Never true    Ran Out of Food in the Last Year: Never true  Transportation Needs: No Transportation Needs (10/22/2022)   PRAPARE - Administrator, Civil Service (Medical): No    Lack of Transportation (Non-Medical): No  Physical Activity: Not on file  Stress: Not on file  Social Connections: Not on file  Intimate Partner Violence:  Not At Risk (10/22/2022)   Humiliation, Afraid, Rape, and Kick questionnaire    Fear of Current or Ex-Partner: No    Emotionally Abused: No    Physically Abused: No    Sexually Abused: No    FAMILY HISTORY: Family History  Problem Relation Age of Onset   Transient ischemic attack Mother    Asthma Mother    Hypertension Mother    Lung cancer Father 25   Hypertension Father    Asthma Sister    Anxiety disorder Sister  Asthma Sister    Hypertension Brother    Ovarian cancer Maternal Aunt 58   Melanoma Maternal Uncle        dx 37s   Lung cancer Paternal Aunt 57   Stomach cancer Maternal Grandmother    Colon cancer Maternal Grandmother    Lymphoma Maternal Grandmother    Melanoma Maternal Grandfather 72       sun exposure   Lung cancer Paternal Grandmother 89   Kidney cancer Paternal Grandfather        dx early   Crohn's disease Other    GER disease Other    Breast cancer Neg Hx     ALLERGIES:  is allergic to shellfish allergy, codeine, morphine and codeine, rosuvastatin, percocet [oxycodone-acetaminophen], sulfa antibiotics, and theophyllines.  MEDICATIONS:  Current Outpatient Medications  Medication Sig Dispense Refill   acidophilus (RISAQUAD) CAPS capsule Take 1 capsule by mouth daily.     albuterol (PROVENTIL HFA;VENTOLIN HFA) 108 (90 Base) MCG/ACT inhaler Inhale 2 puffs into the lungs every 4 (four) hours as needed for wheezing or shortness of breath. Reported on 03/29/2015 1 Inhaler 3   albuterol (PROVENTIL) (2.5 MG/3ML) 0.083% nebulizer solution Take 2.5 mg by nebulization as needed.     ALPRAZolam (XANAX) 0.5 MG tablet TAKE 1/2 TO 1 TABLET BY MOUTH AS NEEDED FOR PANIC ATTACKS 30 tablet 3   ascorbic acid (VITAMIN C) 500 MG tablet Take 1 tablet (500 mg total) by mouth 2 (two) times daily. 60 tablet 0   aspirin EC 81 MG tablet Take 81 mg by mouth daily.     aspirin-acetaminophen-caffeine (EXCEDRIN MIGRAINE) 250-250-65 MG tablet Take 2 tablets by mouth every 6 (six)  hours as needed for migraine.     carvedilol (COREG) 6.25 MG tablet TAKE 1 TABLET BY MOUTH TWICE A DAY 180 tablet 1   cholecalciferol (VITAMIN D3) 25 MCG (1000 UNIT) tablet Take 1,000 Units by mouth daily.     Continuous Glucose Sensor (FREESTYLE LIBRE 2 SENSOR) MISC 1 each by Does not apply route every 14 (fourteen) days. 2 each 3   Cyanocobalamin (VITAMIN B 12) 500 MCG TABS Take by mouth daily at 8 pm.     desvenlafaxine (PRISTIQ) 100 MG 24 hr tablet TAKE 1 TABLET BY MOUTH EVERY DAY 90 tablet 2   dexamethasone (DECADRON) 4 MG tablet Take 2 tablets (8mg ) by mouth daily starting the day after carboplatin for 2 days. Take with food 30 tablet 1   Ensure Max Protein (ENSURE MAX PROTEIN) LIQD Take 330 mLs (11 oz total) by mouth 2 (two) times daily.     fenofibrate 54 MG tablet TAKE 1 TABLET BY MOUTH EVERY DAY 90 tablet 3   insulin NPH-regular Human (70-30) 100 UNIT/ML injection Inject 15 Units into the skin in the morning and at bedtime.     iron polysaccharides (NIFEREX) 150 MG capsule Take 1 capsule (150 mg total) by mouth daily. 30 capsule 2   lidocaine-prilocaine (EMLA) cream Apply to affected area once 30 g 3   metFORMIN (GLUCOPHAGE) 500 MG tablet TAKE TWO TABLETS EACH MORNING. AND 3 TABLETS IN THE EVENING AS DIRECTED 150 tablet 5   methocarbamol (ROBAXIN) 500 MG tablet Take 1 tablet (500 mg total) by mouth every 8 (eight) hours as needed for muscle spasms. 30 tablet 0   montelukast (SINGULAIR) 10 MG tablet TAKE 1 TABLET BY MOUTH EVERYDAY AT BEDTIME 90 tablet 1   Multiple Vitamin (MULTIVITAMIN WITH MINERALS) TABS tablet Take 1 tablet by mouth daily. 30 tablet  2   nystatin (MYCOSTATIN/NYSTOP) powder APPLY TOPICALLY TWICE DAILY AS NEEDED 30 g 3   nystatin ointment (MYCOSTATIN) Apply 1 Application topically 2 (two) times daily. 30 g 0   ondansetron (ZOFRAN-ODT) 8 MG disintegrating tablet Take 1 tablet (8 mg total) by mouth every 8 (eight) hours as needed for nausea or vomiting. 90 tablet 3    oxyCODONE (OXY IR/ROXICODONE) 5 MG immediate release tablet Take 1 tablet (5 mg total) by mouth every 6 (six) hours as needed for severe pain (pain score 7-10) or moderate pain (pain score 4-6). 60 tablet 0   pantoprazole (PROTONIX) 40 MG tablet TAKE 1 TABLET BY MOUTH EVERY DAY 90 tablet 3   pravastatin (PRAVACHOL) 40 MG tablet TAKE 1 TABLET BY MOUTH EVERY DAY 30 tablet 5   prochlorperazine (COMPAZINE) 10 MG tablet Take 1 tablet (10 mg total) by mouth every 6 (six) hours as needed for nausea or vomiting. 30 tablet 1   zinc sulfate 220 (50 Zn) MG capsule Take 1 capsule (220 mg total) by mouth daily. 30 capsule 0   fluconazole (DIFLUCAN) 200 MG tablet Take 1 tablet (200 mg total) by mouth daily. (Patient not taking: Reported on 11/12/2022) 30 tablet 3   ondansetron (ZOFRAN) 8 MG tablet Take 1 tablet (8 mg total) by mouth every 8 (eight) hours as needed for nausea or vomiting. Start on the third day after carboplatin. (Patient not taking: Reported on 11/21/2022) 30 tablet 1   No current facility-administered medications for this visit.   Facility-Administered Medications Ordered in Other Visits  Medication Dose Route Frequency Provider Last Rate Last Admin   0.9 %  sodium chloride infusion   Intravenous Continuous Rickard Patience, MD 10 mL/hr at 11/21/22 0939 New Bag at 11/21/22 0939   CARBOplatin (PARAPLATIN) 690 mg in sodium chloride 0.9 % 250 mL chemo infusion  690 mg Intravenous Once Rickard Patience, MD       PACLitaxel (TAXOL) 360 mg in sodium chloride 0.9 % 500 mL chemo infusion (> 80mg /m2)  175 mg/m2 (Treatment Plan Adjusted) Intravenous Once Rickard Patience, MD 187 mL/hr at 11/21/22 1205 Rate Change at 11/21/22 1205    Review of Systems  Constitutional:  Positive for appetite change and unexpected weight change. Negative for chills, fatigue and fever.  HENT:   Negative for hearing loss and voice change.   Eyes:  Negative for eye problems.  Respiratory:  Negative for chest tightness, cough and shortness of  breath.   Cardiovascular:  Negative for chest pain and leg swelling.  Gastrointestinal:  Positive for abdominal distention and abdominal pain. Negative for blood in stool.  Endocrine: Negative for hot flashes.  Genitourinary:  Negative for difficulty urinating and frequency.   Musculoskeletal:  Negative for arthralgias.       S/p left  BKA   Skin:  Negative for itching and rash.  Neurological:  Negative for extremity weakness.  Hematological:  Negative for adenopathy.  Psychiatric/Behavioral:  Negative for confusion.      PHYSICAL EXAMINATION: ECOG PERFORMANCE STATUS: 1 - Symptomatic but completely ambulatory  Vitals:   11/21/22 0840 11/21/22 0900  BP: (!) 161/87 (!) 158/83  Resp: 18   Temp: (!) 96.1 F (35.6 C)    Filed Weights   11/21/22 0840  Weight: 269 lb 3.2 oz (122.1 kg)    Physical Exam Constitutional:      General: She is not in acute distress.    Appearance: She is obese. She is not diaphoretic.  HENT:  Head: Normocephalic and atraumatic.     Mouth/Throat:     Pharynx: No oropharyngeal exudate.  Eyes:     General: No scleral icterus. Cardiovascular:     Rate and Rhythm: Normal rate and regular rhythm.     Heart sounds: No murmur heard. Pulmonary:     Effort: Pulmonary effort is normal. No respiratory distress.     Breath sounds: Normal breath sounds. No wheezing.  Abdominal:     General: There is distension.     Palpations: Abdomen is soft.     Tenderness: There is no abdominal tenderness.  Musculoskeletal:        General: Normal range of motion.     Cervical back: Normal range of motion and neck supple.     Comments: S/p left BKA  Skin:    General: Skin is warm and dry.     Findings: No erythema.  Neurological:     Mental Status: She is alert and oriented to person, place, and time. Mental status is at baseline.     Cranial Nerves: No cranial nerve deficit.     Motor: No abnormal muscle tone.  Psychiatric:        Mood and Affect: Mood and  affect normal.      LABORATORY DATA:  I have reviewed the data as listed    Latest Ref Rng & Units 11/21/2022    8:09 AM 11/18/2022    9:19 AM 10/29/2022    4:52 AM  CBC  WBC 4.0 - 10.5 K/uL 9.9  11.0  9.4   Hemoglobin 12.0 - 15.0 g/dL 16.1  09.6  04.5   Hematocrit 36.0 - 46.0 % 34.7  37.4  32.2   Platelets 150 - 400 K/uL 431  504  671       Latest Ref Rng & Units 11/21/2022    8:09 AM 10/29/2022    4:52 AM 10/26/2022    8:07 AM  CMP  Glucose 70 - 99 mg/dL 409  811  914   BUN 6 - 20 mg/dL 25  33  34   Creatinine 0.44 - 1.00 mg/dL 7.82  9.56  2.13   Sodium 135 - 145 mmol/L 136  133  134   Potassium 3.5 - 5.1 mmol/L 4.6  4.8  5.5   Chloride 98 - 111 mmol/L 103  98  100   CO2 22 - 32 mmol/L 24  26  27    Calcium 8.9 - 10.3 mg/dL 8.8  8.7  8.9   Total Protein 6.5 - 8.1 g/dL 7.4     Total Bilirubin <1.2 mg/dL 0.2     Alkaline Phos 38 - 126 U/L 43     AST 15 - 41 U/L 16     ALT 0 - 44 U/L 12        RADIOGRAPHIC STUDIES: I have personally reviewed the radiological images as listed and agreed with the findings in the report. CT Chest W Contrast  Result Date: 11/20/2022 CLINICAL DATA:  Peritoneal carcinomatosis; staging; * Tracking Code: BO * EXAM: CT CHEST WITH CONTRAST TECHNIQUE: Multidetector CT imaging of the chest was performed during intravenous contrast administration. RADIATION DOSE REDUCTION: This exam was performed according to the departmental dose-optimization program which includes automated exposure control, adjustment of the mA and/or kV according to patient size and/or use of iterative reconstruction technique. CONTRAST:  75mL OMNIPAQUE IOHEXOL 300 MG/ML  SOLN COMPARISON:  CT abdomen and pelvis dated October 22, 2022 FINDINGS: Cardiovascular: Normal heart size. No  pericardial effusion. Normal caliber thoracic aorta with mild atherosclerotic disease. Mitral annular calcifications. Severe coronary artery calcifications. Mediastinum/Nodes: Esophagus and thyroid are  unremarkable. No enlarged axillary, mediastinal or hilar lymph nodes. Prominent subcentimeter right retrocrural lymph nodes. Reference node measuring 7 mm in short axis on series 2, image 129. Lungs/Pleura: Central airways are patent. Ground-glass nodule of the superior portion of the right lower lobe measuring 7 mm on series 4, image 71. Small left pleural effusion. No pneumothorax. Upper Abdomen: Partially visualized large volume abdominal ascites and peritoneal thickening, volume ascites appears increased when compared with the prior CT. Musculoskeletal: No chest wall abnormality. No acute or significant osseous findings. IMPRESSION: 1. Prominent subcentimeter right retrocrural lymph nodes, metastatic disease can not be excluded. 2. Nonspecific ground-glass nodule of the superior portion of the right lower lobe measuring 7 mm. Recommend attention on follow-up. 3. Small left pleural effusion. 4. Partially visualized large volume abdominal ascites and peritoneal thickening, volume of ascites appears increased when compared with the prior CT. 5. Coronary artery calcifications and aortic Atherosclerosis (ICD10-I70.0). Electronically Signed   By: Allegra Lai M.D.   On: 11/20/2022 13:24   IR IMAGING GUIDED PORT INSERTION  Result Date: 11/18/2022 INDICATION: Chemotherapy access EXAM: Placement of chest port using ultrasound and fluoroscopic guidance MEDICATIONS: Documented in the EMR ANESTHESIA/SEDATION: Moderate (conscious) sedation was employed during this procedure. A total of Versed 2 mg and Fentanyl 125 mcg was administered intravenously. Moderate Sedation Time: 28 minutes. The patient's level of consciousness and vital signs were monitored continuously by radiology nursing throughout the procedure under my direct supervision. FLUOROSCOPY TIME:  Fluoroscopy Time: 0.8 minutes (10 mGy) COMPLICATIONS: None immediate. PROCEDURE: Informed written consent was obtained from the patient after a thorough  discussion of the procedural risks, benefits and alternatives. All questions were addressed. Maximal Sterile Barrier Technique was utilized including caps, mask, sterile gowns, sterile gloves, sterile drape, hand hygiene and skin antiseptic. A timeout was performed prior to the initiation of the procedure. The patient was placed supine on the exam table. The right neck and chest was prepped and draped in the standard sterile fashion. A preliminary ultrasound of the right neck was performed and demonstrates a patent right internal jugular vein. A permanent ultrasound image was stored in the electronic medical record. The overlying skin was anesthetized with 1% Lidocaine. Using ultrasound guidance, access was obtained into the right internal jugular vein using a 21 gauge micropuncture set. A wire was advanced into the SVC, a short incision was made at the puncture site, and serial dilatation performed. Next, in an ipsilateral infraclavicular location, an incision was made at the site of the subcutaneous reservoir. Blunt dissection was used to open a pocket to contain the reservoir. A subcutaneous tunnel was then created from the port site to the puncture site. A(n) 8 Fr single lumen catheter was advanced through the tunnel. The catheter was attached to the port and this was placed in the subcutaneous pocket. Under fluoroscopic guidance, a peel away sheath was placed, and the catheter was trimmed to the appropriate length and was advanced into the central veins. The catheter length is 23 cm. The tip of the catheter lies near the superior cavoatrial junction. A kink was noted at the apex of the central venous catheter in the neck, which was able to be corrected with blunt manipulation. The port flushes and aspirates appropriately. The port was flushed and locked with heparinized saline. The port pocket was closed in 2 layers using 3-0 and 4-0 Vicryl/absorbable  suture. Dermabond was also applied to both incisions. The  patient tolerated the procedure well and was transferred to recovery in stable condition. IMPRESSION: Successful placement of a right-sided chest port via the right internal jugular vein. The port catheter tip lies near the superior cavoatrial junction. The port is ready for immediate use Note: The final image documented in PACS demonstrates a kink at the apex of the catheter, which was corrected prior to completion of the procedure. Electronically Signed   By: Olive Bass M.D.   On: 11/18/2022 14:08   CT ABDOMINAL MASS BIOPSY  Result Date: 11/18/2022 INDICATION: Ascites with new omental caking, concern for metastatic disease EXAM: CT-guided core needle biopsy of the omentum MEDICATIONS: None. ANESTHESIA/SEDATION: Moderate (conscious) sedation was employed during this procedure. A total of Versed 1 mg and Fentanyl 50 mcg was administered intravenously. Moderate Sedation Time: 10 minutes. The patient's level of consciousness and vital signs were monitored continuously by radiology nursing throughout the procedure under my direct supervision. FLUOROSCOPY TIME:  N/a COMPLICATIONS: None immediate. PROCEDURE: Informed written consent was obtained from the patient after a thorough discussion of the procedural risks, benefits and alternatives. All questions were addressed. Maximal Sterile Barrier Technique was utilized including caps, mask, sterile gowns, sterile gloves, sterile drape, hand hygiene and skin antiseptic. A timeout was performed prior to the initiation of the procedure. The patient was placed supine on the exam table. Limited CT of the abdomen and pelvis was performed for planning purposes. This demonstrated omental thickening in the anterior mid abdomen. Skin entry site was marked, and the overlying skin was prepped and draped in the standard sterile fashion. Local analgesia was obtained with 1% lidocaine. Using intermittent CT fluoroscopy, a 17 gauge introducer needle was advanced towards the  identified lesion. Subsequently, core needle biopsy was performed using an 18 gauge core biopsy device x4 total passes. Specimens were submitted in formalin to pathology for further handling. Limited postprocedure imaging demonstrated no complicating feature. The patient tolerated the procedure well, and was transferred to recovery in stable condition. IMPRESSION: Successful CT-guided core needle biopsy of abnormally thickened omentum. Electronically Signed   By: Olive Bass M.D.   On: 11/18/2022 14:03   US Paracentesis  Result Date: 10/28/2022 INDICATION: Patient with peritoneal carcinomatosis with recurrent malignant ascites. Therapeutic paracentesis requested. EXAM: ULTRASOUND GUIDED PARACENTESIS MEDICATIONS: 1% lidocaine 10 mL COMPLICATIONS: None immediate. PROCEDURE: Informed written consent was obtained from the patient after a discussion of the risks, benefits and alternatives to treatment. A timeout was performed prior to the initiation of the procedure. Initial ultrasound scanning demonstrates a large amount of ascites within the left lower abdominal quadrant. The left lower abdomen was prepped and draped in the usual sterile fashion. 1% lidocaine was used for local anesthesia. Following this, a 19 gauge, 10-cm, Yueh catheter was introduced. An ultrasound image was saved for documentation purposes. The paracentesis was performed. The catheter was removed and a dressing was applied. The patient tolerated the procedure well without immediate post procedural complication. Patient received post-procedure intravenous albumin; see nursing notes for details. FINDINGS: A total of approximately 3.1 L of clear yellow fluid was removed. IMPRESSION: Successful ultrasound-guided paracentesis yielding 3.1 liters of peritoneal fluid. Procedure performed by Alwyn Ren NP Electronically Signed   By: Malachy Moan M.D.   On: 10/28/2022 11:04   US Paracentesis  Result Date: 10/24/2022 INDICATION: Patient  presents today with possible malignant ascites. Diagnostic and therapeutic paracentesis. EXAM: ULTRASOUND GUIDED PARACENTESIS MEDICATIONS: 1% lidocaine 10 mL COMPLICATIONS: None  immediate. PROCEDURE: Informed written consent was obtained from the patient after a discussion of the risks, benefits and alternatives to treatment. A timeout was performed prior to the initiation of the procedure. Initial ultrasound scanning demonstrates a large amount of ascites within the left lower abdominal quadrant. The left lower abdomen was prepped and draped in the usual sterile fashion. 1% lidocaine was used for local anesthesia. Following this, a 19 gauge, 10-cm, Yueh catheter was introduced. An ultrasound image was saved for documentation purposes. The paracentesis was performed. The catheter was removed and a dressing was applied. The patient tolerated the procedure well without immediate post procedural complication. Patient received post-procedure intravenous albumin; see nursing notes for details. FINDINGS: A total of approximately 3.9 L of clear yellow fluid was removed. IMPRESSION: Successful ultrasound-guided paracentesis yielding 3.9 liters of peritoneal fluid. Procedure performed by Alwyn Ren NP Electronically Signed   By: Olive Bass M.D.   On: 10/24/2022 08:12   CT ABDOMEN PELVIS W CONTRAST  Result Date: 10/22/2022 CLINICAL DATA:  Abdominal pain, most pronounced in the left lower quadrant and suprapubic regions. Vomiting, fever. EXAM: CT ABDOMEN AND PELVIS WITH CONTRAST TECHNIQUE: Multidetector CT imaging of the abdomen and pelvis was performed using the standard protocol following bolus administration of intravenous contrast. RADIATION DOSE REDUCTION: This exam was performed according to the departmental dose-optimization program which includes automated exposure control, adjustment of the mA and/or kV according to patient size and/or use of iterative reconstruction technique. CONTRAST:  OMNIPAQUE  IOHEXOL 300 MG/ML  SOLN COMPARISON:  12/12/2004 FINDINGS: Lower chest: 6 no acute abnormality Hepatobiliary: No focal liver abnormality is seen. Status post cholecystectomy. No biliary dilatation. Pancreas: No focal abnormality or ductal dilatation. Spleen: No focal abnormality.  Normal size. Adrenals/Urinary Tract: No adrenal abnormality. No focal renal abnormality. No stones or hydronephrosis. Urinary bladder is unremarkable. Stomach/Bowel: Stomach, large and small bowel grossly unremarkable. Vascular/Lymphatic: Aortic atherosclerosis. No evidence of aneurysm or adenopathy. Reproductive: Uterus and adnexa unremarkable.  No mass. Other: Large volume ascites in the abdomen and pelvis. Omental thickening/caking noted anteriorly. Appearance is concerning for peritoneal carcinomatosis, often seen with ovarian cancer. However, no ovarian mass visualized. Musculoskeletal: No acute bony abnormality. IMPRESSION: Large volume ascites in the abdomen or pelvis with omental caking. Appearance is concerning for peritoneal malignancy/carcinomatosis, often seen with ovarian cancer, but no ovarian mass visualized. Aortic atherosclerosis. Electronically Signed   By: Charlett Nose M.D.   On: 10/22/2022 19:41

## 2022-11-21 NOTE — Patient Instructions (Signed)

## 2022-11-21 NOTE — Progress Notes (Signed)
Pt here for tx. Pt reports she has been having pressure and pain to abdomen. Pt had bx and port placement on Monday and was told she did not have enough fluid to be drained. Pain to abdomen is worse and she has been taking Tramadol but it has not been helping.

## 2022-11-22 ENCOUNTER — Ambulatory Visit (INDEPENDENT_AMBULATORY_CARE_PROVIDER_SITE_OTHER): Payer: 59 | Admitting: Vascular Surgery

## 2022-11-22 ENCOUNTER — Telehealth: Payer: Self-pay

## 2022-11-22 ENCOUNTER — Other Ambulatory Visit: Payer: Self-pay

## 2022-11-22 LAB — CA 125: Cancer Antigen (CA) 125: 944 U/mL — ABNORMAL HIGH (ref 0.0–38.1)

## 2022-11-22 NOTE — Telephone Encounter (Signed)
Telephone call to patient for follow up after receiving first infusion.   Patient states infusion went great.  States eating good and drinking plenty of fluids.   Denies any nausea or vomiting.  Encouraged patient to call for any concerns or questions. 

## 2022-11-25 ENCOUNTER — Encounter: Payer: Self-pay | Admitting: Licensed Clinical Social Worker

## 2022-11-25 ENCOUNTER — Ambulatory Visit: Payer: 59

## 2022-11-25 ENCOUNTER — Telehealth: Payer: Self-pay | Admitting: Licensed Clinical Social Worker

## 2022-11-25 DIAGNOSIS — Z1379 Encounter for other screening for genetic and chromosomal anomalies: Secondary | ICD-10-CM | POA: Insufficient documentation

## 2022-11-25 DIAGNOSIS — Z1589 Genetic susceptibility to other disease: Secondary | ICD-10-CM | POA: Insufficient documentation

## 2022-11-25 NOTE — Telephone Encounter (Signed)
I contacted Nancy [name] to discuss her genetic testing results. 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 Briefly discussed with patient, will see her Thursday 11/21 while she is here for other appointments to discuss further.  The test report has been scanned into EPIC and is located under the Molecular Pathology section of the Results Review tab.  A portion of the result report is included below for reference.      Lacy Duverney, MS, [facility] Genetic Counselor [email or web address] Phone: [phone]

## 2022-11-26 ENCOUNTER — Encounter: Payer: Self-pay | Admitting: Licensed Clinical Social Worker

## 2022-11-27 NOTE — Progress Notes (Unsigned)
Genetic Test Results - BRIP1+  HPI:   Nancy [name] was previously seen in the [facility] due to a personal and family history of cancer and concerns regarding a hereditary predisposition to cancer. Please refer to our prior cancer genetics clinic note for more information regarding our discussion, assessment and recommendations, at the time. Nancy [name] recent genetic test results were disclosed to her, as were recommendations warranted by these results. These results and recommendations are discussed in more detail below.  CANCER HISTORY:  Oncology History  Primary peritoneal carcinomatosis (HCC)  11/02/2022 Imaging   Large volume ascites in the abdomen or pelvis with omental caking.Appearance is concerning for peritoneal malignancy/carcinomatosis,often seen with ovarian cancer, but no ovarian mass visualized. Aortic atherosclerosis    11/06/2022 Initial Diagnosis   Primary peritoneal carcinomatosis   -presented to emergency room for evaluation of abdominal pain/bloating, poor oral intake, nonbloody nonbilious vomiting  Patient underwent paracentesis, cytology showed malignant cells. IHC can not be added.    11/14/2022 Imaging   CT chest w contrast  1. Prominent subcentimeter right retrocrural lymph nodes, metastatic disease can not be excluded. 2. Nonspecific ground-glass nodule of the superior portion of the right lower lobe measuring 7 mm. Recommend attention on follow-up. 3. Small left pleural effusion. 4. Partially visualized large volume abdominal ascites and peritoneal thickening, volume of ascites appears increased when compared with the prior CT. 5. Coronary artery calcifications and aortic Atherosclerosis (ICD10-I70.0).   11/18/2022 Procedure   Medi port placement by IR   11/21/2022 -  Chemotherapy   Patient is on Treatment Plan : Carboplatin + Paclitaxel q21d      Genetic Testing   Pathogenic variant in BRIP1 called  p.S624* (c.1871C>A)  identified on the [facility] CancerNext-Expanded+RNA panel. The report date is 11/25/2022.  The CancerNext-Expanded + RNAinsight gene panel offered by [employer] and includes sequencing and rearrangement analysis for the following 71 genes: AIP, ALK, APC*, ATM*, AXIN2, BAP1, BARD1, BMPR1A, BRCA1*, BRCA2*, BRIP1*, CDC73, CDH1*,CDK4, CDKN1B, CDKN2A, CHEK2*, CTNNA1, DICER1, FH, FLCN, KIF1B, LZTR1, MAX, MEN1, MET, MLH1*, MSH2*, MSH3, MSH6*, MUTYH*, NF1*, NF2, NTHL1, PALB2*, PHOX2B, PMS2*, POT1, PRKAR1A, PTCH1, PTEN*, RAD51C*, RAD51D*,RB1, RET, SDHA, SDHAF2, SDHB, SDHC, SDHD, SMAD4, SMARCA4, SMARCB1, SMARCE1, STK11, SUFU, TMEM127, TP53*,TSC1, TSC2, VHL; EGFR, EGLN1, HOXB13, KIT, MITF, PDGFRA, POLD1 and POLE (sequencing only); EPCAM and GREM1 (deletion/duplication only).      FAMILY HISTORY:  We obtained a detailed, 4-generation family history.  Significant diagnoses are listed below: Family History  Problem Relation Age of Onset   Transient ischemic attack Mother    Asthma Mother    Hypertension Mother    Lung cancer Father 96   Hypertension Father    Asthma Sister    Anxiety disorder Sister    Asthma Sister    Hypertension Brother    Ovarian cancer Maternal Aunt 28   Melanoma Maternal Uncle        dx 15s   Lung cancer Paternal Aunt 75   Stomach cancer Maternal Grandmother    Colon cancer Maternal Grandmother    Lymphoma Maternal Grandmother    Melanoma Maternal Grandfather 72       sun exposure   Lung cancer Paternal Grandmother 86   Kidney cancer Paternal Grandfather        dx early   Crohn's disease Other    GER disease Other    Breast cancer Neg Hx     Nancy [name] has 1 full brother, 2 maternal half sisters. A nephew had  Rothmund Thomson syndrome (possible RECQL4 mutation?), he had stomach and brain cancer and passed at 35.    Nancy [name] mother is living at 63. Maternal aunt died of ovarian cancer at 74. Maternal uncle had melanoma, he also had sun exposure from the [employer]  and truck driving, he passed in his 46s. Maternal grandmother had colon and stomach cancer and lymphoma, and died at 32 of a stroke. Maternal grandfather had melanoma and passed of it at 25, he also had sun exposure.   Nancy [name]'s father died of metastatic lung cancer at 32, he had history of smoking. Paternal aunt also died of lung cancer, history of smoking. Her daughter, patient's cousin, had kidney cancer at 39. Paternal grandfather had kidney cancer early in life, but passed in his late 12s of heart issues. Paternal grandmother died at 15 of lung cancer.   Nancy [name] is unaware of previous family history of genetic testing for hereditary cancer risks. There is no reported Ashkenazi Jewish ancestry. There is no known consanguinity.       GENETIC TEST RESULTS:  One pathogenic variant was identified in the BRIP1 gene on the [facility] CancerNext-Expanded+RNA panel. Specifically, this variant is p.S624* (c.1871C>A). The remainder of testing was normal.  The CancerNext-Expanded + RNAinsight gene panel offered by [employer] and includes sequencing and rearrangement analysis for the following 71 genes: AIP, ALK, APC*, ATM*, AXIN2, BAP1, BARD1, BMPR1A, BRCA1*, BRCA2*, BRIP1*, CDC73, CDH1*,CDK4, CDKN1B, CDKN2A, CHEK2*, CTNNA1, DICER1, FH, FLCN, KIF1B, LZTR1, MAX, MEN1, MET, MLH1*, MSH2*, MSH3, MSH6*, MUTYH*, NF1*, NF2, NTHL1, PALB2*, PHOX2B, PMS2*, POT1, PRKAR1A, PTCH1, PTEN*, RAD51C*, RAD51D*,RB1, RET, SDHA, SDHAF2, SDHB, SDHC, SDHD, SMAD4, SMARCA4, SMARCB1, SMARCE1, STK11, SUFU, TMEM127, TP53*,TSC1, TSC2, VHL; EGFR, EGLN1, HOXB13, KIT, MITF, PDGFRA, POLD1 and POLE (sequencing only); EPCAM and GREM1 (deletion/duplication only).    The test report has been scanned into EPIC and is located under the Molecular Pathology section of the Results Review tab.  A portion of the result report is included below for reference. Genetic testing reported out on 11/25/2022.        Cancer Risks for  BRIP1: Ovarian cancer, 5-15% Currently, there is no known increase in cancer risk for males with a BRIP1 mutation.   Management Recommendations: Ovarian Cancer Screening/Risk Reduction: A risk-reducing salpingo oophorectomy (RRSO), removal of the ovaries and fallopian tubes, is recommended starting at age 81-50 or earlier based on a specific family history of an earlier onset ovarian cancer. Having a RRSO is estimated to reduce the risk of ovarian cancer by up to 96%. There is still a small risk of developing an "ovarian-like" cancer in the lining of the abdomen, called the peritoneum.   This information is based on current understanding of the gene and may change in the future. Guidelines from NCCN Genetic/Familial High-Risk Assessment: Breast, Ovarian, Pancreatic and Prostate Cancer v2.2025.   Implications for Family Members: Hereditary predisposition to cancer due to pathogenic variants in the BRIP1 gene has autosomal dominant inheritance. This means that an individual with a pathogenic variant has a 50% chance of passing the condition on to his/her offspring. Identification of a pathogenic variant allows for the recognition of at-risk relatives who can pursue testing for the familial variant.   Family members are encouraged to consider genetic testing for this familial pathogenic variant. As there are generally no childhood cancer risks associated with pathogenic variants in the BRIP1 gene, individuals in the family are not recommended to have testing until they reach at least 58 years of age.  They may contact our office at [phone] for more information or to schedule an appointment.  Complimentary testing for the familial variant is available for 90 days.  Family members who live outside of the area are encouraged to find a genetic counselor in their area by visiting: [email or web address].  PLAN: 1. These results will be made available to her referring provider/care  team (Dr. Cathie Hoops, Dr. Johnnette Litter, Ladora Daniel, RN) and her PCP, Grayling Congress, FNP. She would like these providers to follow her long-term for this indication.   2. Nancy [name] plans to discuss these results with her family and will reach out to [place] if we can be of any assistance in coordinating genetic testing for any of her relatives.   We encouraged Nancy [name] to remain in contact with [place] on an annual basis so we can update her personal and family histories, and let her know of advances in cancer genetics that may benefit the family. Our contact number was provided. Nancy [name] questions were answered to her satisfaction today, and she knows she is welcome to call anytime with additional questions.   Lacy Duverney, MS, [facility] Genetic Counselor [email or web address] Phone: [phone]

## 2022-11-28 ENCOUNTER — Inpatient Hospital Stay: Payer: 59

## 2022-11-28 ENCOUNTER — Encounter: Payer: Self-pay | Admitting: Oncology

## 2022-11-28 ENCOUNTER — Inpatient Hospital Stay (HOSPITAL_BASED_OUTPATIENT_CLINIC_OR_DEPARTMENT_OTHER): Payer: 59 | Admitting: Hospice and Palliative Medicine

## 2022-11-28 ENCOUNTER — Inpatient Hospital Stay (HOSPITAL_BASED_OUTPATIENT_CLINIC_OR_DEPARTMENT_OTHER): Payer: 59 | Admitting: Oncology

## 2022-11-28 ENCOUNTER — Inpatient Hospital Stay: Payer: 59 | Admitting: Licensed Clinical Social Worker

## 2022-11-28 VITALS — BP 162/84 | HR 78 | Temp 97.1°F | Resp 18 | Wt 263.3 lb

## 2022-11-28 DIAGNOSIS — G893 Neoplasm related pain (acute) (chronic): Secondary | ICD-10-CM

## 2022-11-28 DIAGNOSIS — D508 Other iron deficiency anemias: Secondary | ICD-10-CM | POA: Diagnosis not present

## 2022-11-28 DIAGNOSIS — C482 Malignant neoplasm of peritoneum, unspecified: Secondary | ICD-10-CM

## 2022-11-28 DIAGNOSIS — E440 Moderate protein-calorie malnutrition: Secondary | ICD-10-CM

## 2022-11-28 DIAGNOSIS — Z1589 Genetic susceptibility to other disease: Secondary | ICD-10-CM

## 2022-11-28 DIAGNOSIS — Z5111 Encounter for antineoplastic chemotherapy: Secondary | ICD-10-CM | POA: Diagnosis not present

## 2022-11-28 DIAGNOSIS — Z1379 Encounter for other screening for genetic and chromosomal anomalies: Secondary | ICD-10-CM

## 2022-11-28 LAB — CBC WITH DIFFERENTIAL (CANCER CENTER ONLY)
Abs Immature Granulocytes: 0.03 10*3/uL (ref 0.00–0.07)
Basophils Absolute: 0.1 10*3/uL (ref 0.0–0.1)
Basophils Relative: 1 %
Eosinophils Absolute: 0.5 10*3/uL (ref 0.0–0.5)
Eosinophils Relative: 9 %
HCT: 31.8 % — ABNORMAL LOW (ref 36.0–46.0)
Hemoglobin: 10 g/dL — ABNORMAL LOW (ref 12.0–15.0)
Immature Granulocytes: 1 %
Lymphocytes Relative: 37 %
Lymphs Abs: 2 10*3/uL (ref 0.7–4.0)
MCH: 28.2 pg (ref 26.0–34.0)
MCHC: 31.4 g/dL (ref 30.0–36.0)
MCV: 89.6 fL (ref 80.0–100.0)
Monocytes Absolute: 0.3 10*3/uL (ref 0.1–1.0)
Monocytes Relative: 6 %
Neutro Abs: 2.5 10*3/uL (ref 1.7–7.7)
Neutrophils Relative %: 46 %
Platelet Count: 347 10*3/uL (ref 150–400)
RBC: 3.55 MIL/uL — ABNORMAL LOW (ref 3.87–5.11)
RDW: 14.3 % (ref 11.5–15.5)
WBC Count: 5.3 10*3/uL (ref 4.0–10.5)
nRBC: 0 % (ref 0.0–0.2)

## 2022-11-28 LAB — CMP (CANCER CENTER ONLY)
ALT: 14 U/L (ref 0–44)
AST: 22 U/L (ref 15–41)
Albumin: 3.2 g/dL — ABNORMAL LOW (ref 3.5–5.0)
Alkaline Phosphatase: 40 U/L (ref 38–126)
Anion gap: 11 (ref 5–15)
BUN: 25 mg/dL — ABNORMAL HIGH (ref 6–20)
CO2: 26 mmol/L (ref 22–32)
Calcium: 9.1 mg/dL (ref 8.9–10.3)
Chloride: 101 mmol/L (ref 98–111)
Creatinine: 0.74 mg/dL (ref 0.44–1.00)
GFR, Estimated: >60 mL/min (ref >60)
Glucose, Bld: 123 mg/dL — ABNORMAL HIGH (ref 70–99)
Potassium: 4.7 mmol/L (ref 3.5–5.1)
Sodium: 138 mmol/L (ref 135–145)
Total Bilirubin: 0.4 mg/dL (ref <1.2)
Total Protein: 7.2 g/dL (ref 6.5–8.1)

## 2022-11-28 MED ORDER — DICYCLOMINE HCL 10 MG PO CAPS: 10.0000 mg | ORAL_CAPSULE | Freq: Three times a day (TID) | ORAL | 1 refills | Status: DC

## 2022-11-28 MED ORDER — SODIUM CHLORIDE 0.9% FLUSH
10.0000 mL | Freq: Once | INTRAVENOUS | Status: AC
  Administered 2022-11-28: 10 mL via INTRAVENOUS
  Filled 2022-11-28: qty 10

## 2022-11-28 MED ORDER — HYDROMORPHONE HCL 2 MG PO TABS: 2.0000 mg | ORAL_TABLET | ORAL | 0 refills | Status: DC | PRN

## 2022-11-28 MED ORDER — HEPARIN SOD (PORK) LOCK FLUSH 100 UNIT/ML IV SOLN
500.0000 [IU] | Freq: Once | INTRAVENOUS | Status: AC
  Administered 2022-11-28: 500 [IU] via INTRAVENOUS
  Filled 2022-11-28: qty 5

## 2022-11-28 MED ORDER — NALOXONE HCL 4 MG/0.1ML NA LIQD: NASAL | 0 refills | Status: AC

## 2022-11-28 NOTE — Assessment & Plan Note (Addendum)
Malignant ascites. CA125 is significantly elevated in 700s.  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  Obtain HRD testing. Case was discussed on gynonc TB on 11/13. I will not add Bevacizumab neoadjuvantly given her recent surgery, Labs are reviewed and discussed with patient. S/p cycle 1 carboplatin and taxol.   Khorana score 3  Recommend to start Eliquis 2.5mg  BID, continue Aspirin 81mg  daily.  Genetic evaluation.

## 2022-11-28 NOTE — Assessment & Plan Note (Addendum)
She has tried both tramadol and oxycodone, which did not help her pain.  She will establish care with palliative care service for further management.

## 2022-11-28 NOTE — Progress Notes (Signed)
Palliative Medicine [facility] at [facility] [phone] [phone]   Name: Nancy [name] Date: 11/28/2022 MRN: [id number]  DOB: 10/31/1965  Patient Care Team: Miki Kins, FNP as PCP - General (Family Medicine) Benita Gutter, RN as Oncology Nurse Navigator Rickard Patience, MD as Consulting Physician (Oncology)    REASON FOR CONSULTATION: Nancy [name] is a 57 y.o. female with multiple medical problems including primary peritoneal carcinomatosis.  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  Patient has had ongoing pain.  She is referred to palliative care to address goals and manage ongoing symptoms.  SOCIAL HISTORY:     reports that she quit smoking about 37 years ago. Her smoking use included cigarettes. She has never used smokeless tobacco. She reports that she does not drink alcohol and does not use drugs.  Patient is married and lives at home with her wife.  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  Patient is now disabled.  ADVANCE DIRECTIVES:    CODE STATUS:   PAST MEDICAL HISTORY: Past Medical History:  Diagnosis Date   Abnormal cardiovascular stress test 09/21/2018   Formatting of this note might be different from the original. Lexiscan Myoview 09/16/2018 revealed mild anterior ischemia   Adverse effect of motion 05/10/2014   Anxiety    Arthritis    r knee   Asthma    Breast cyst 05/10/2014   CAD (coronary artery disease)    a.) LHC 06/04/2009 at [facility]; non-obstructive CAD. b.) CTA with FFR 10/08/2018: extensive mixed plaque proximal to mid LAD (51-69%); Coronary Ca score 217; FFR 0.71 dPDA, 0.86 mLAD, 0.87 dLCx.   CCF (congestive cardiac failure) (HCC) 06/06/2008   a.) 30% EF. b.) TTE 06/03/2011: EF >55%; triv MR. c.) TTE 04/27/2018: EF 55%; mild LVH; triv PR, mild MR/TR; G1DD.   Chest pain with high risk for cardiac etiology 07/02/2016   Chronic use of  opiate drug for therapeutic purpose 02/13/2022   Formatting of this note might be different from the original. [facility] Pain Contract signed on 04/18/16 & updated 03/04/17; UDS done on 04/18/16.   Complication of anesthesia    Diabetic foot infection (HCC) 03/13/2015   Diabetic foot ulcer (HCC) 02/08/2021   Eczema    Family history of adverse reaction to anesthesia    a.) PONV in mother and grandmother   GERD (gastroesophageal reflux disease) 05/10/2014   History of kidney stones    HLD (hyperlipidemia)    Hyponatremia 03/13/2015   Migraines    Motion sickness    Other acute osteomyelitis, left ankle and foot (HCC) 02/13/2022   Panic attacks    Peripheral edema 04/27/2018   Pneumonia    PONV (postoperative nausea and vomiting)    Sepsis secondary to diabetic foot infection 03/13/2015   T2DM (type 2 diabetes mellitus) (HCC)    Takotsubo cardiomyopathy / transient apical balooning syndrome / stress-induced cardiomyopathy 05/10/2014   Unspecified essential hypertension     PAST SURGICAL HISTORY:  Past Surgical History:  Procedure Laterality Date   AMPUTATION Left 09/19/2022   Procedure: AMPUTATION BELOW KNEE;  Surgeon: Renford Dills, MD;  Location: [facility];  Service: Vascular;  Laterality: Left;   AMPUTATION Left 09/24/2022   Procedure: AMPUTATION BELOW KNEE WITH WOUND CLOSURE;  Surgeon: Renford Dills, MD;  Location: [facility];  Service: Vascular;  Laterality: Left;   AMPUTATION TOE Left 03/16/2015   Procedure: left fifth toe amputation with incision  and drainage;  Surgeon: Gwyneth Revels, DPM;  Location: [facility];  Service: Podiatry;  Laterality: Left;   APPLICATION OF WOUND VAC Left 02/14/2021   Procedure: APPLICATION OF WOUND VAC;  Surgeon: Gwyneth Revels, DPM;  Location: [facility];  Service: Podiatry;  Laterality: Left;   APPLICATION OF WOUND VAC Left 04/16/2021   Procedure: APPLICATION OF WOUND VAC;  Surgeon: Peggye Form, DO;  Location: [facility];  Service:  Plastics;  Laterality: Left;   CARDIAC CATHETERIZATION Left 06/04/2009   Procedure: CARDIAC CATHETERIZATION; Location: [facility]   CHOLECYSTECTOMY     DEBRIDEMENT AND CLOSURE WOUND Left 04/16/2021   Procedure: DEBRIDEMENT AND CLOSURE WOUND;  Surgeon: Peggye Form, DO;  Location: [facility];  Service: Plastics;  Laterality: Left;  1 hour   IR IMAGING GUIDED PORT INSERTION  11/18/2022   IRRIGATION AND DEBRIDEMENT FOOT Left 02/08/2021   Procedure: IRRIGATION AND DEBRIDEMENT FOOT - LFT HEEL ULCER;  Surgeon: Gwyneth Revels, DPM;  Location: [facility];  Service: Podiatry;  Laterality: Left;   IRRIGATION AND DEBRIDEMENT FOOT Left 02/14/2021   Procedure: IRRIGATION AND DEBRIDEMENT LEFT HEEL;  Surgeon: Gwyneth Revels, DPM;  Location: [facility];  Service: Podiatry;  Laterality: Left;   kidney stone removal     KNEE ARTHROSCOPY W/ MENISCAL REPAIR     NOSE SURGERY  01/07/1989   due to fracture   TEE WITHOUT CARDIOVERSION N/A 09/26/2022   Procedure: TRANSESOPHAGEAL ECHOCARDIOGRAM;  Surgeon: Clotilde Dieter, DO;  Location: [facility];  Service: Cardiovascular;  Laterality: N/A;   TOE AMPUTATION     second toe   TONSILLECTOMY     TOTAL KNEE ARTHROPLASTY  01/11/2011   Procedure: TOTAL KNEE ARTHROPLASTY;  Surgeon: Harvie Junior;  Location: [facility];  Service: Orthopedics;  Laterality: Right;  COMPUTER ASSISTED TOTAL KNEE REPLACEMENT    HEMATOLOGY/ONCOLOGY HISTORY:  Oncology History  Primary peritoneal carcinomatosis (HCC)  11/02/2022 Imaging   Large volume ascites in the abdomen or pelvis with omental caking.Appearance is concerning for peritoneal malignancy/carcinomatosis,often seen with ovarian cancer, but no ovarian mass visualized. Aortic atherosclerosis    11/06/2022 Initial Diagnosis   Primary peritoneal carcinomatosis   -presented to emergency room for evaluation of abdominal pain/bloating, poor oral intake, nonbloody nonbilious vomiting  Patient underwent  paracentesis, cytology showed malignant cells. IHC can not be added.    11/14/2022 Imaging   CT chest w contrast  1. Prominent subcentimeter right retrocrural lymph nodes, metastatic disease can not be excluded. 2. Nonspecific ground-glass nodule of the superior portion of the right lower lobe measuring 7 mm. Recommend attention on follow-up. 3. Small left pleural effusion. 4. Partially visualized large volume abdominal ascites and peritoneal thickening, volume of ascites appears increased when compared with the prior CT. 5. Coronary artery calcifications and aortic Atherosclerosis (ICD10-I70.0).   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 The report date is 11/25/2022.  The CancerNext-Expanded + RNAinsight gene panel offered by [employer] and includes sequencing and rearrangement analysis for the following 71 genes: AIP, ALK, APC*, ATM*, AXIN2, BAP1, BARD1, BMPR1A, BRCA1*, BRCA2*, BRIP1*, CDC73, CDH1*,CDK4, CDKN1B, CDKN2A, CHEK2*, CTNNA1, DICER1, FH, FLCN, KIF1B, LZTR1, MAX, MEN1, MET, MLH1*, MSH2*, MSH3, MSH6*, MUTYH*, NF1*, NF2, NTHL1, PALB2*, PHOX2B, PMS2*, POT1, PRKAR1A, PTCH1, PTEN*, RAD51C*,  RAD51D*,RB1, RET, SDHA, SDHAF2, SDHB, SDHC, SDHD, SMAD4, SMARCA4, SMARCB1, SMARCE1, STK11, SUFU, TMEM127, TP53*,TSC1, TSC2, VHL; EGFR, EGLN1, HOXB13, KIT, MITF, PDGFRA, POLD1 and POLE (sequencing only); EPCAM and GREM1 (deletion/duplication only).      ALLERGIES:  is allergic to shellfish allergy, codeine, morphine and codeine, rosuvastatin, percocet [oxycodone-acetaminophen], sulfa antibiotics, and theophyllines.  MEDICATIONS:  Current Outpatient Medications  Medication Sig Dispense Refill   acidophilus (RISAQUAD) CAPS capsule Take 1 capsule by mouth daily.      albuterol (PROVENTIL HFA;VENTOLIN HFA) 108 (90 Base) MCG/ACT inhaler Inhale 2 puffs into the lungs every 4 (four) hours as needed for wheezing or shortness of breath. Reported on 03/29/2015 1 Inhaler 3   albuterol (PROVENTIL) (2.5 MG/3ML) 0.083% nebulizer solution Take 2.5 mg by nebulization as needed.     ALPRAZolam (XANAX) 0.5 MG tablet TAKE 1/2 TO 1 TABLET BY MOUTH AS NEEDED FOR PANIC ATTACKS 30 tablet 3   apixaban (ELIQUIS) 2.5 MG TABS tablet Take 1 tablet (2.5 mg total) by mouth 2 (two) times daily. 60 tablet 2   ascorbic acid (VITAMIN C) 500 MG tablet Take 1 tablet (500 mg total) by mouth 2 (two) times daily. 60 tablet 0   aspirin EC 81 MG tablet Take 81 mg by mouth daily.     carvedilol (COREG) 6.25 MG tablet TAKE 1 TABLET BY MOUTH TWICE A DAY 180 tablet 1   cholecalciferol (VITAMIN D3) 25 MCG (1000 UNIT) tablet Take 1,000 Units by mouth daily.     Continuous Glucose Sensor (FREESTYLE LIBRE 2 SENSOR) MISC 1 each by Does not apply route every 14 (fourteen) days. 2 each 3   Cyanocobalamin (VITAMIN B 12) 500 MCG TABS Take by mouth daily at 8 pm.     desvenlafaxine (PRISTIQ) 100 MG 24 hr tablet TAKE 1 TABLET BY MOUTH EVERY DAY 90 tablet 2   dexamethasone (DECADRON) 4 MG tablet Take 2 tablets (8mg ) by mouth daily starting the day after carboplatin for 2 days. Take with food 30 tablet 1   Ensure Max Protein (ENSURE MAX PROTEIN) LIQD Take 330 mLs (11 oz total) by mouth 2 (two) times daily.     fenofibrate 54 MG tablet TAKE 1 TABLET BY MOUTH EVERY DAY 90 tablet 3   fluconazole (DIFLUCAN) 200 MG tablet Take 1 tablet (200 mg total) by mouth daily. 30 tablet 3   insulin NPH-regular Human (70-30) 100 UNIT/ML injection Inject 15 Units into the skin in the morning and at bedtime.     iron polysaccharides (NIFEREX) 150 MG capsule Take 1 capsule (150 mg total) by mouth daily. 30 capsule 2   lidocaine-prilocaine (EMLA) cream Apply to affected area once 30 g 3   lisinopril (ZESTRIL) 5 MG tablet Take 5 mg by  mouth 2 (two) times daily.     metFORMIN (GLUCOPHAGE) 500 MG tablet TAKE TWO TABLETS EACH MORNING. AND 3 TABLETS IN THE EVENING AS DIRECTED 150 tablet 5   methocarbamol (ROBAXIN) 500 MG tablet Take 1 tablet (500 mg total) by mouth every 8 (eight) hours as needed for muscle spasms. 30 tablet 0   montelukast (SINGULAIR) 10 MG tablet TAKE 1 TABLET BY MOUTH EVERYDAY AT BEDTIME 90 tablet 1   Multiple Vitamin (MULTIVITAMIN WITH MINERALS) TABS tablet Take 1 tablet by mouth daily. 30 tablet 2   nystatin (MYCOSTATIN/NYSTOP) powder APPLY TOPICALLY TWICE DAILY AS NEEDED 30 g 3   nystatin ointment (MYCOSTATIN) Apply 1 Application topically 2 (two) times daily. 30 g 0   ondansetron (ZOFRAN) 8 MG tablet  Take 1 tablet (8 mg total) by mouth every 8 (eight) hours as needed for nausea or vomiting. Start on the third day after carboplatin. 30 tablet 1   ondansetron (ZOFRAN-ODT) 8 MG disintegrating tablet Take 1 tablet (8 mg total) by mouth every 8 (eight) hours as needed for nausea or vomiting. 90 tablet 3   oxyCODONE (OXY IR/ROXICODONE) 5 MG immediate release tablet Take 1 tablet (5 mg total) by mouth every 6 (six) hours as needed for severe pain (pain score 7-10) or moderate pain (pain score 4-6). 60 tablet 0   pantoprazole (PROTONIX) 40 MG tablet TAKE 1 TABLET BY MOUTH EVERY DAY 90 tablet 3   pravastatin (PRAVACHOL) 40 MG tablet TAKE 1 TABLET BY MOUTH EVERY DAY 30 tablet 5   prochlorperazine (COMPAZINE) 10 MG tablet Take 1 tablet (10 mg total) by mouth every 6 (six) hours as needed for nausea or vomiting. 30 tablet 1   zinc sulfate 220 (50 Zn) MG capsule Take 1 capsule (220 mg total) by mouth daily. 30 capsule 0   No current facility-administered medications for this visit.   Facility-Administered Medications Ordered in Other Visits  Medication Dose Route Frequency Provider Last Rate Last Admin   heparin lock flush 100 unit/mL  500 Units Intravenous Once Rickard Patience, MD        VITAL SIGNS: LMP 02/08/2013 (Within  Months)  There were no vitals filed for this visit.  Estimated body mass index is 38.88 kg/m as calculated from the following:   Height as of 11/18/22: 5\' 9"  (1.753 m).   Weight as of an earlier encounter on 11/28/22: 263 lb 4.8 oz (119.4 kg).  LABS: CBC:    Component Value Date/Time   WBC 5.3 11/28/2022 1259   WBC 11.0 (H) 11/18/2022 0919   HGB 10.0 (L) 11/28/2022 1259   HGB 12.3 08/07/2022 1210   HCT 31.8 (L) 11/28/2022 1259   HCT 38.6 08/07/2022 1210   PLT 347 11/28/2022 1259   PLT 320 08/07/2022 1210   MCV 89.6 11/28/2022 1259   MCV 90 08/07/2022 1210   MCV 90 08/21/2012 1523   NEUTROABS 2.5 11/28/2022 1259   NEUTROABS 5.5 08/07/2022 1210   NEUTROABS 2.2 06/05/2011 0550   LYMPHSABS 2.0 11/28/2022 1259   LYMPHSABS 2.4 08/07/2022 1210   LYMPHSABS 2.7 06/05/2011 0550   MONOABS 0.3 11/28/2022 1259   MONOABS 0.4 06/05/2011 0550   EOSABS 0.5 11/28/2022 1259   EOSABS 0.5 (H) 08/07/2022 1210   EOSABS 0.6 06/05/2011 0550   BASOSABS 0.1 11/28/2022 1259   BASOSABS 0.1 08/07/2022 1210   BASOSABS 0.1 06/05/2011 0550   Comprehensive Metabolic Panel:    Component Value Date/Time   NA 138 11/28/2022 1259   NA 132 (L) 08/07/2022 1210   NA 133 (L) 08/21/2012 1523   K 4.7 11/28/2022 1259   K 4.1 08/21/2012 1523   CL 101 11/28/2022 1259   CL 102 08/21/2012 1523   CO2 26 11/28/2022 1259   CO2 26 08/21/2012 1523   BUN 25 (H) 11/28/2022 1259   BUN 23 08/07/2022 1210   BUN 17 08/21/2012 1523   CREATININE 0.74 11/28/2022 1259   CREATININE 0.69 08/21/2012 1523   GLUCOSE 123 (H) 11/28/2022 1259   GLUCOSE 306 (H) 08/21/2012 1523   CALCIUM 9.1 11/28/2022 1259   CALCIUM 8.8 08/21/2012 1523   AST 22 11/28/2022 1259   ALT 14 11/28/2022 1259   ALT 25 08/21/2012 1523   ALKPHOS 40 11/28/2022 1259   ALKPHOS 73 08/21/2012 1523  BILITOT 0.4 11/28/2022 1259   PROT 7.2 11/28/2022 1259   PROT 7.0 08/07/2022 1210   PROT 7.1 08/21/2012 1523   ALBUMIN 3.2 (L) 11/28/2022 1259   ALBUMIN  4.0 08/07/2022 1210   ALBUMIN 3.4 08/21/2012 1523    RADIOGRAPHIC STUDIES: CT Chest W Contrast  Result Date: 11/20/2022 CLINICAL DATA:  Peritoneal carcinomatosis; staging; * Tracking Code: BO * EXAM: CT CHEST WITH CONTRAST TECHNIQUE: Multidetector CT imaging of the chest was performed during intravenous contrast administration. RADIATION DOSE REDUCTION: This exam was performed according to the departmental dose-optimization program which includes automated exposure control, adjustment of the mA and/or kV according to patient size and/or use of iterative reconstruction technique. CONTRAST:  75mL OMNIPAQUE IOHEXOL 300 MG/ML  SOLN COMPARISON:  CT abdomen and pelvis dated October 22, 2022 FINDINGS: Cardiovascular: Normal heart size. No pericardial effusion. Normal caliber thoracic aorta with mild atherosclerotic disease. Mitral annular calcifications. Severe coronary artery calcifications. Mediastinum/Nodes: Esophagus and thyroid are unremarkable. No enlarged axillary, mediastinal or hilar lymph nodes. Prominent subcentimeter right retrocrural lymph nodes. Reference node measuring 7 mm in short axis on series 2, image 129. Lungs/Pleura: Central airways are patent. Ground-glass nodule of the superior portion of the right lower lobe measuring 7 mm on series 4, image 71. Small left pleural effusion. No pneumothorax. Upper Abdomen: Partially visualized large volume abdominal ascites and peritoneal thickening, volume ascites appears increased when compared with the prior CT. Musculoskeletal: No chest wall abnormality. No acute or significant osseous findings. IMPRESSION: 1. Prominent subcentimeter right retrocrural lymph nodes, metastatic disease can not be excluded. 2. Nonspecific ground-glass nodule of the superior portion of the right lower lobe measuring 7 mm. Recommend attention on follow-up. 3. Small left pleural effusion. 4. Partially visualized large volume abdominal ascites and peritoneal thickening, volume  of ascites appears increased when compared with the prior CT. 5. Coronary artery calcifications and aortic Atherosclerosis (ICD10-I70.0). Electronically Signed   By: Allegra Lai M.D.   On: 11/20/2022 13:24   IR IMAGING GUIDED PORT INSERTION  Result Date: 11/18/2022 INDICATION: Chemotherapy access EXAM: Placement of chest port using ultrasound and fluoroscopic guidance MEDICATIONS: Documented in the EMR ANESTHESIA/SEDATION: Moderate (conscious) sedation was employed during this procedure. A total of Versed 2 mg and Fentanyl 125 mcg was administered intravenously. Moderate Sedation Time: 28 minutes. The patient's level of consciousness and vital signs were monitored continuously by radiology nursing throughout the procedure under my direct supervision. FLUOROSCOPY TIME:  Fluoroscopy Time: 0.8 minutes (10 mGy) COMPLICATIONS: None immediate. PROCEDURE: Informed written consent was obtained from the patient after a thorough discussion of the procedural risks, benefits and alternatives. All questions were addressed. Maximal Sterile Barrier Technique was utilized including caps, mask, sterile gowns, sterile gloves, sterile drape, hand hygiene and skin antiseptic. A timeout was performed prior to the initiation of the procedure. The patient was placed supine on the exam table. The right neck and chest was prepped and draped in the standard sterile fashion. A preliminary ultrasound of the right neck was performed and demonstrates a patent right internal jugular vein. A permanent ultrasound image was stored in the electronic medical record. The overlying skin was anesthetized with 1% Lidocaine. Using ultrasound guidance, access was obtained into the right internal jugular vein using a 21 gauge micropuncture set. A wire was advanced into the SVC, a short incision was made at the puncture site, and serial dilatation performed. Next, in an ipsilateral infraclavicular location, an incision was made at the site of the  subcutaneous reservoir. Blunt dissection was  used to open a pocket to contain the reservoir. A subcutaneous tunnel was then created from the port site to the puncture site. A(n) 8 Fr single lumen catheter was advanced through the tunnel. The catheter was attached to the port and this was placed in the subcutaneous pocket. Under fluoroscopic guidance, a peel away sheath was placed, and the catheter was trimmed to the appropriate length and was advanced into the central veins. The catheter length is 23 cm. The tip of the catheter lies near the superior cavoatrial junction. A kink was noted at the apex of the central venous catheter in the neck, which was able to be corrected with blunt manipulation. The port flushes and aspirates appropriately. The port was flushed and locked with heparinized saline. The port pocket was closed in 2 layers using 3-0 and 4-0 Vicryl/absorbable suture. Dermabond was also applied to both incisions. The patient tolerated the procedure well and was transferred to recovery in stable condition. IMPRESSION: Successful placement of a right-sided chest port via the right internal jugular vein. The port catheter tip lies near the superior cavoatrial junction. The port is ready for immediate use Note: The final image documented in PACS demonstrates a kink at the apex of the catheter, which was corrected prior to completion of the procedure. Electronically Signed   By: Olive Bass M.D.   On: 11/18/2022 14:08   CT ABDOMINAL MASS BIOPSY  Result Date: 11/18/2022 INDICATION: Ascites with new omental caking, concern for metastatic disease EXAM: CT-guided core needle biopsy of the omentum MEDICATIONS: None. ANESTHESIA/SEDATION: Moderate (conscious) sedation was employed during this procedure. A total of Versed 1 mg and Fentanyl 50 mcg was administered intravenously. Moderate Sedation Time: 10 minutes. The patient's level of consciousness and vital signs were monitored continuously by radiology  nursing throughout the procedure under my direct supervision. FLUOROSCOPY TIME:  N/a COMPLICATIONS: None immediate. PROCEDURE: Informed written consent was obtained from the patient after a thorough discussion of the procedural risks, benefits and alternatives. All questions were addressed. Maximal Sterile Barrier Technique was utilized including caps, mask, sterile gowns, sterile gloves, sterile drape, hand hygiene and skin antiseptic. A timeout was performed prior to the initiation of the procedure. The patient was placed supine on the exam table. Limited CT of the abdomen and pelvis was performed for planning purposes. This demonstrated omental thickening in the anterior mid abdomen. Skin entry site was marked, and the overlying skin was prepped and draped in the standard sterile fashion. Local analgesia was obtained with 1% lidocaine. Using intermittent CT fluoroscopy, a 17 gauge introducer needle was advanced towards the identified lesion. Subsequently, core needle biopsy was performed using an 18 gauge core biopsy device x4 total passes. Specimens were submitted in formalin to pathology for further handling. Limited postprocedure imaging demonstrated no complicating feature. The patient tolerated the procedure well, and was transferred to recovery in stable condition. IMPRESSION: Successful CT-guided core needle biopsy of abnormally thickened omentum. Electronically Signed   By: Olive Bass M.D.   On: 11/18/2022 14:03    PERFORMANCE STATUS (ECOG) : 2 - Symptomatic, <50% confined to bed  Review of Systems Unless otherwise noted, a complete review of systems is negative.  Physical Exam General: NAD Pulmonary: Unlabored Abdomen: soft, nontender, + bowel sounds GU: no suprapubic tenderness Extremities: no edema, no joint deformities, L. BKA Skin: no rashes Neurological: Weakness but otherwise nonfocal  IMPRESSION: I met with patient and wife.  Introduced palliative care services and attempted  to establish therapeutic rapport.  Patient  describes significant anxiety and stress over the past several months.  She had been dealing with wound infection on the left lower extremity, which ultimately led to amputation and loss of her long-term employment in September.  Patient now has newly diagnosed advanced stage cancer.  Patient endorses anxiety and depression.  She has been on Pristiq long-term prescribed by her PCP.  Will send referral to [facility]. Would welcome recommendations for adjustment to her antidepressant/anxiolytic regimen.  Will also send referral to social work and community palliative care.  Patient and wife endorses significant financial stressors due to loss of income and healthcare costs.  Symptomatically, patient endorses generalized abdominal pain.  This is described as a "stabbing" and "spasming" pain, which is worse at night and after meals.  Patient has nausea but no vomiting.  Denies diarrhea or constipation.  Patient has historically taken tramadol occasionally for migraines.  She was prescribed hydromorphone when she underwent her BKA surgery.  Most recently, patient was prescribed oxycodone by Dr. Cathie Hoops due to reported abdominal pain.  Patient says that she has taken the tramadol and oxycodone with little improvement in her abdominal pain.  She says that the hydromorphone has been most effective of 3 medications.  However, patient says that she is not routinely taking pain medications and prefers to reserve the pain medications for when the pain is most severe.  Patient request refill of hydromorphone.  Discussed that we would discontinue other opioid pain medications.  Discussed the differences between short and long-acting opioids.  Discussed safe administration/storage of medications and risks versus benefits of opioid pain medications.  Will also trial Bentyl as patient appears to be having a component of spasmodic GI pain.  PLAN: -Continue current scope of  treatment -Will benefit from future ACP completion -Referrals to social work, community palliative care, and [facility] -Refill hydromorphone 2 mg every 4 hours as needed #60 -Bentyl 10 mg 3 times daily as needed, consider increasing to 20 mg if needed -Daily bowel regimen  Case and plan discussed with Dr. Cathie Hoops   Patient expressed understanding and was in agreement with this plan. She also understands that She can call the clinic at any time with any questions, concerns, or complaints.     Time Total: 25 minutes  Visit consisted of counseling and education dealing with the complex and emotionally intense issues of symptom management and palliative care in the setting of serious and potentially life-threatening illness.Greater than 50%  of this time was spent counseling and coordinating care related to the above assessment and [email or web address]  Signed by: Laurette Schimke, PhD, NP-C

## 2022-11-28 NOTE — Progress Notes (Signed)
Hematology/Oncology Progress note [phone] [phone]      CHIEF COMPLAINTS  Primary peritoneal carcinomatosis  ASSESSMENT & PLAN:   Primary peritoneal carcinomatosis (HCC) Malignant ascites. CA125 is significantly elevated in 700s.  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  Obtain HRD testing. Case was discussed on gynonc TB on 11/13. I will not add Bevacizumab neoadjuvantly given her recent surgery, Labs are reviewed and discussed with patient. S/p cycle 1 carboplatin and taxol.   Khorana score 3  Recommend to start Eliquis 2.5mg  BID, continue Aspirin 81mg  daily.  Genetic evaluation.   Neoplasm related pain She has tried both tramadol and oxycodone, which did not help her pain.  She will establish care with palliative care service for further management.   Protein calorie malnutrition (HCC) Refer to establish care with nutritionist.   IDA (iron deficiency anemia) Lab Results  Component Value Date   HGB 10.0 (L) 11/28/2022   TIBC 195 (L) 09/19/2022   IRONPCTSAT 7 (L) 09/19/2022   FERRITIN 314 (H) 09/19/2022    continue oral iron supplementation.    Follow up 2 weeks lab MD chemo.   All questions were answered. The patient knows to call the clinic with any problems, questions or concerns.  Rickard Patience, MD, PhD [facility] Hematology Oncology 11/28/2022    HISTORY OF PRESENTING ILLNESS:  Nancy [name] 57 y.o. female presents to establish care for  peritoneal carcinomatosis I have reviewed her chart and materials related to her cancer extensively and collaborated history with the patient. Summary of oncologic history is as follows: Oncology History  Primary peritoneal carcinomatosis (HCC)  11/02/2022 Imaging   Large volume ascites in the abdomen or pelvis with omental caking.Appearance is concerning for peritoneal malignancy/carcinomatosis,often  seen with ovarian cancer, but no ovarian mass visualized. Aortic atherosclerosis    11/06/2022 Initial Diagnosis   Primary peritoneal carcinomatosis   -presented to emergency room for evaluation of abdominal pain/bloating, poor oral intake, nonbloody nonbilious vomiting  Patient underwent paracentesis, cytology showed malignant cells. IHC can not be added.    11/14/2022 Imaging   CT chest w contrast  1. Prominent subcentimeter right retrocrural lymph nodes, metastatic disease can not be excluded. 2. Nonspecific ground-glass nodule of the superior portion of the right lower lobe measuring 7 mm. Recommend attention on follow-up. 3. Small left pleural effusion. 4. Partially visualized large volume abdominal ascites and peritoneal thickening, volume of ascites appears increased when compared with the prior CT. 5. Coronary artery calcifications and aortic Atherosclerosis (ICD10-I70.0).   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 The report date is 11/25/2022.  The CancerNext-Expanded + RNAinsight gene panel offered by [employer] and includes sequencing and rearrangement analysis for the following 71 genes: AIP, ALK, APC*, ATM*, AXIN2, BAP1, BARD1, BMPR1A, BRCA1*, BRCA2*, BRIP1*, CDC73, CDH1*,CDK4, CDKN1B, CDKN2A, CHEK2*, CTNNA1, DICER1, FH, FLCN, KIF1B, LZTR1, MAX, MEN1, MET, MLH1*, MSH2*, MSH3, MSH6*, MUTYH*, NF1*, NF2, NTHL1, PALB2*, PHOX2B, PMS2*, POT1, PRKAR1A, PTCH1, PTEN*, RAD51C*, RAD51D*,RB1, RET, SDHA, SDHAF2, SDHB, SDHC, SDHD, SMAD4, SMARCA4, SMARCB1, SMARCE1, STK11, SUFU, TMEM127, TP53*,TSC1, TSC2, VHL; EGFR, EGLN1, HOXB13, KIT, MITF, PDGFRA, POLD1 and POLE (sequencing only); EPCAM and GREM1 (deletion/duplication only).    History of sepsis secondary  to diabetic foot infection/osteomyelitis status post left BKA done on 09/24/2022,    Today she presents to for evaluation prior to chemotherapy. Accompanied by significant other.  Abdominal distension has improved after paracentesis x 2 times during her hospitalization.   Good for 2 days after chemo, 3rd day [11/24/22]  abdominal pain, severe, oxycodone did not help.  She went back to use one dose of dilaudid which has helped her to sleep. + night sweat that evening.  11/25/2022 abdominal pain/bloating was better so she did not go for paracentesis. Pain got worse after eating. She did not take additional pain pills on that day. .  She last took Dilaudid last night.  Pain level is 7-8 currently   + loose BM, 2 times yesterday, once today.     MEDICAL HISTORY:  Past Medical History:  Diagnosis Date   Abnormal cardiovascular stress test 09/21/2018   Formatting of this note might be different from the original. Lexiscan Myoview 09/16/2018 revealed mild anterior ischemia   Adverse effect of motion 05/10/2014   Anxiety    Arthritis    r knee   Asthma    Breast cyst 05/10/2014   CAD (coronary artery disease)    a.) LHC 06/04/2009 [facility]; non-obstructive CAD. b.) CTA with FFR 10/08/2018: extensive mixed plaque proximal to mid LAD (51-69%); Coronary Ca score 217; FFR 0.71 dPDA, 0.86 mLAD, 0.87 dLCx.   CCF (congestive cardiac failure) (HCC) 06/06/2008   a.) 30% EF. b.) TTE 06/03/2011: EF >55%; triv MR. c.) TTE 04/27/2018: EF 55%; mild LVH; triv PR, mild MR/TR; G1DD.   Chest pain with high risk for cardiac etiology 07/02/2016   Chronic use of opiate drug for therapeutic purpose 02/13/2022   Formatting of this note might be different from the original. [facility] Pain Contract signed on 04/18/16 & updated 03/04/17; UDS done on 04/18/16.   Complication of anesthesia    Diabetic foot infection (HCC) 03/13/2015   Diabetic foot ulcer (HCC) 02/08/2021   Eczema    Family history of adverse reaction to  anesthesia    a.) PONV in mother and grandmother   GERD (gastroesophageal reflux disease) 05/10/2014   History of kidney stones    HLD (hyperlipidemia)    Hyponatremia 03/13/2015   Migraines    Motion sickness    Other acute osteomyelitis, left ankle and foot (HCC) 02/13/2022   Panic attacks    Peripheral edema 04/27/2018   Pneumonia    PONV (postoperative nausea and vomiting)    Sepsis secondary to diabetic foot infection 03/13/2015   T2DM (type 2 diabetes mellitus) (HCC)    Takotsubo cardiomyopathy / transient apical balooning syndrome / stress-induced cardiomyopathy 05/10/2014   Unspecified essential hypertension     SURGICAL HISTORY: Past Surgical History:  Procedure Laterality Date   AMPUTATION Left 09/19/2022   Procedure: AMPUTATION BELOW KNEE;  Surgeon: Renford Dills, MD;  Location: [facility];  Service: Vascular;  Laterality: Left;   AMPUTATION Left 09/24/2022   Procedure: AMPUTATION BELOW KNEE WITH WOUND CLOSURE;  Surgeon: Renford Dills, MD;  Location: [facility];  Service: Vascular;  Laterality: Left;   AMPUTATION TOE Left 03/16/2015   Procedure: left fifth toe amputation with incision and drainage;  Surgeon: Gwyneth Revels, DPM;  Location: [facility];  Service: Podiatry;  Laterality: Left;   APPLICATION OF WOUND VAC Left 02/14/2021   Procedure: APPLICATION OF WOUND VAC;  Surgeon: Gwyneth Revels, DPM;  Location: [facility];  Service: Podiatry;  Laterality: Left;   APPLICATION OF WOUND VAC  Left 04/16/2021   Procedure: APPLICATION OF WOUND VAC;  Surgeon: Peggye Form, DO;  Location: [facility];  Service: Plastics;  Laterality: Left;   CARDIAC CATHETERIZATION Left 06/04/2009   Procedure: CARDIAC CATHETERIZATION; Location: [facility]   CHOLECYSTECTOMY     DEBRIDEMENT AND CLOSURE WOUND Left 04/16/2021   Procedure: DEBRIDEMENT AND CLOSURE WOUND;  Surgeon: Peggye Form, DO;  Location: [facility];  Service: Plastics;   Laterality: Left;  1 hour   IR IMAGING GUIDED PORT INSERTION  11/18/2022   IRRIGATION AND DEBRIDEMENT FOOT Left 02/08/2021   Procedure: IRRIGATION AND DEBRIDEMENT FOOT - LFT HEEL ULCER;  Surgeon: Gwyneth Revels, DPM;  Location: [facility];  Service: Podiatry;  Laterality: Left;   IRRIGATION AND DEBRIDEMENT FOOT Left 02/14/2021   Procedure: IRRIGATION AND DEBRIDEMENT LEFT HEEL;  Surgeon: Gwyneth Revels, DPM;  Location: [facility];  Service: Podiatry;  Laterality: Left;   kidney stone removal     KNEE ARTHROSCOPY W/ MENISCAL REPAIR     NOSE SURGERY  01/07/1989   due to fracture   TEE WITHOUT CARDIOVERSION N/A 09/26/2022   Procedure: TRANSESOPHAGEAL ECHOCARDIOGRAM;  Surgeon: Clotilde Dieter, DO;  Location: [facility];  Service: Cardiovascular;  Laterality: N/A;   TOE AMPUTATION     second toe   TONSILLECTOMY     TOTAL KNEE ARTHROPLASTY  01/11/2011   Procedure: TOTAL KNEE ARTHROPLASTY;  Surgeon: Harvie Junior;  Location: [facility];  Service: Orthopedics;  Laterality: Right;  COMPUTER ASSISTED TOTAL KNEE REPLACEMENT    SOCIAL HISTORY: Social History   Socioeconomic History   Marital status: Married    Spouse name: Not on file   Number of children: Not on file   Years of education: Not on file   Highest education level: Not on file  Occupational History   Not on file  Tobacco Use   Smoking status: Former    Current packs/day: 0.00    Types: Cigarettes    Quit date: 03/12/1985    Years since quitting: 37.7   Smokeless tobacco: Never  Vaping Use   Vaping status: Never Used  Substance and Sexual Activity   Alcohol use: No   Drug use: No   Sexual activity: Yes    Birth control/protection: Post-menopausal  Other Topics Concern   Not on file  Social History Narrative   Wife, [name], at bedside. One indoor pet, dog.   Social Determinants of Health   Financial Resource Strain: Not on file  Food Insecurity: No Food Insecurity (10/22/2022)   Hunger Vital Sign    Worried About Running Out of  Food in the Last Year: Never true    Ran Out of Food in the Last Year: Never true  Transportation Needs: No Transportation Needs (10/22/2022)   PRAPARE - Administrator, Civil Service (Medical): No    Lack of Transportation (Non-Medical): No  Physical Activity: Not on file  Stress: Not on file  Social Connections: Not on file  Intimate Partner Violence: Not At Risk (10/22/2022)   Humiliation, Afraid, Rape, and Kick questionnaire    Fear of Current or Ex-Partner: No    Emotionally Abused: No    Physically Abused: No    Sexually Abused: No    FAMILY HISTORY: Family History  Problem Relation Age of Onset   Transient ischemic attack Mother    Asthma Mother    Hypertension Mother    Lung cancer Father 90   Hypertension Father    Asthma Sister  Anxiety disorder Sister    Asthma Sister    Hypertension Brother    Ovarian cancer Maternal Aunt 20   Melanoma Maternal Uncle        dx 11s   Lung cancer Paternal Aunt 10   Stomach cancer Maternal Grandmother    Colon cancer Maternal Grandmother    Lymphoma Maternal Grandmother    Melanoma Maternal Grandfather 72       sun exposure   Lung cancer Paternal Grandmother 60   Kidney cancer Paternal Grandfather        dx early   Crohn's disease Other    GER disease Other    Breast cancer Neg Hx     ALLERGIES:  is allergic to shellfish allergy, codeine, morphine and codeine, rosuvastatin, percocet [oxycodone-acetaminophen], sulfa antibiotics, and theophyllines.  MEDICATIONS:  Current Outpatient Medications  Medication Sig Dispense Refill   acidophilus (RISAQUAD) CAPS capsule Take 1 capsule by mouth daily.     albuterol (PROVENTIL HFA;VENTOLIN HFA) 108 (90 Base) MCG/ACT inhaler Inhale 2 puffs into the lungs every 4 (four) hours as needed for wheezing or shortness of breath. Reported on 03/29/2015 1 Inhaler 3   albuterol (PROVENTIL) (2.5 MG/3ML) 0.083% nebulizer solution Take 2.5 mg by nebulization as needed.     ALPRAZolam  (XANAX) 0.5 MG tablet TAKE 1/2 TO 1 TABLET BY MOUTH AS NEEDED FOR PANIC ATTACKS 30 tablet 3   apixaban (ELIQUIS) 2.5 MG TABS tablet Take 1 tablet (2.5 mg total) by mouth 2 (two) times daily. 60 tablet 2   ascorbic acid (VITAMIN C) 500 MG tablet Take 1 tablet (500 mg total) by mouth 2 (two) times daily. 60 tablet 0   aspirin EC 81 MG tablet Take 81 mg by mouth daily.     carvedilol (COREG) 6.25 MG tablet TAKE 1 TABLET BY MOUTH TWICE A DAY 180 tablet 1   cholecalciferol (VITAMIN D3) 25 MCG (1000 UNIT) tablet Take 1,000 Units by mouth daily.     Continuous Glucose Sensor (FREESTYLE LIBRE 2 SENSOR) MISC 1 each by Does not apply route every 14 (fourteen) days. 2 each 3   Cyanocobalamin (VITAMIN B 12) 500 MCG TABS Take by mouth daily at 8 pm.     desvenlafaxine (PRISTIQ) 100 MG 24 hr tablet TAKE 1 TABLET BY MOUTH EVERY DAY 90 tablet 2   dexamethasone (DECADRON) 4 MG tablet Take 2 tablets (8mg ) by mouth daily starting the day after carboplatin for 2 days. Take with food 30 tablet 1   Ensure Max Protein (ENSURE MAX PROTEIN) LIQD Take 330 mLs (11 oz total) by mouth 2 (two) times daily.     fenofibrate 54 MG tablet TAKE 1 TABLET BY MOUTH EVERY DAY 90 tablet 3   fluconazole (DIFLUCAN) 200 MG tablet Take 1 tablet (200 mg total) by mouth daily. 30 tablet 3   insulin NPH-regular Human (70-30) 100 UNIT/ML injection Inject 15 Units into the skin in the morning and at bedtime.     iron polysaccharides (NIFEREX) 150 MG capsule Take 1 capsule (150 mg total) by mouth daily. 30 capsule 2   lidocaine-prilocaine (EMLA) cream Apply to affected area once 30 g 3   lisinopril (ZESTRIL) 5 MG tablet Take 5 mg by mouth 2 (two) times daily.     metFORMIN (GLUCOPHAGE) 500 MG tablet TAKE TWO TABLETS EACH MORNING. AND 3 TABLETS IN THE EVENING AS DIRECTED 150 tablet 5   methocarbamol (ROBAXIN) 500 MG tablet Take 1 tablet (500 mg total) by mouth every 8 (eight) hours  as needed for muscle spasms. 30 tablet 0   montelukast (SINGULAIR)  10 MG tablet TAKE 1 TABLET BY MOUTH EVERYDAY AT BEDTIME 90 tablet 1   Multiple Vitamin (MULTIVITAMIN WITH MINERALS) TABS tablet Take 1 tablet by mouth daily. 30 tablet 2   nystatin (MYCOSTATIN/NYSTOP) powder APPLY TOPICALLY TWICE DAILY AS NEEDED 30 g 3   nystatin ointment (MYCOSTATIN) Apply 1 Application topically 2 (two) times daily. 30 g 0   ondansetron (ZOFRAN) 8 MG tablet Take 1 tablet (8 mg total) by mouth every 8 (eight) hours as needed for nausea or vomiting. Start on the third day after carboplatin. 30 tablet 1   ondansetron (ZOFRAN-ODT) 8 MG disintegrating tablet Take 1 tablet (8 mg total) by mouth every 8 (eight) hours as needed for nausea or vomiting. 90 tablet 3   oxyCODONE (OXY IR/ROXICODONE) 5 MG immediate release tablet Take 1 tablet (5 mg total) by mouth every 6 (six) hours as needed for severe pain (pain score 7-10) or moderate pain (pain score 4-6). 60 tablet 0   pantoprazole (PROTONIX) 40 MG tablet TAKE 1 TABLET BY MOUTH EVERY DAY 90 tablet 3   pravastatin (PRAVACHOL) 40 MG tablet TAKE 1 TABLET BY MOUTH EVERY DAY 30 tablet 5   prochlorperazine (COMPAZINE) 10 MG tablet Take 1 tablet (10 mg total) by mouth every 6 (six) hours as needed for nausea or vomiting. 30 tablet 1   zinc sulfate 220 (50 Zn) MG capsule Take 1 capsule (220 mg total) by mouth daily. 30 capsule 0   No current facility-administered medications for this visit.   Facility-Administered Medications Ordered in Other Visits  Medication Dose Route Frequency Provider Last Rate Last Admin   heparin lock flush 100 unit/mL  500 Units Intravenous Once Rickard Patience, MD        Review of Systems  Constitutional:  Positive for appetite change and unexpected weight change. Negative for chills, fatigue and fever.  HENT:   Negative for hearing loss and voice change.   Eyes:  Negative for eye problems.  Respiratory:  Negative for chest tightness, cough and shortness of breath.   Cardiovascular:  Negative for chest pain and leg  swelling.  Gastrointestinal:  Positive for abdominal distention and abdominal pain. Negative for blood in stool.  Endocrine: Negative for hot flashes.  Genitourinary:  Negative for difficulty urinating and frequency.   Musculoskeletal:  Negative for arthralgias.       S/p left  BKA   Skin:  Negative for itching and rash.  Neurological:  Negative for extremity weakness.  Hematological:  Negative for adenopathy.  Psychiatric/Behavioral:  Negative for confusion.      PHYSICAL EXAMINATION: ECOG PERFORMANCE STATUS: 1 - Symptomatic but completely ambulatory  Vitals:   11/28/22 1348  Resp: 18  Temp: (!) 97.1 F (36.2 C)   Filed Weights   11/28/22 1348  Weight: 263 lb 4.8 oz (119.4 kg)    Physical Exam Constitutional:      General: She is not in acute distress.    Appearance: She is obese. She is not diaphoretic.  HENT:     Head: Normocephalic and atraumatic.     Mouth/Throat:     Pharynx: No oropharyngeal exudate.  Eyes:     General: No scleral icterus. Cardiovascular:     Rate and Rhythm: Normal rate and regular rhythm.     Heart sounds: No murmur heard. Pulmonary:     Effort: Pulmonary effort is normal. No respiratory distress.     Breath sounds: Normal  breath sounds. No wheezing.  Abdominal:     General: There is no distension.     Palpations: Abdomen is soft. There is mass.     Tenderness: There is no abdominal tenderness.  Musculoskeletal:        General: Normal range of motion.     Cervical back: Normal range of motion and neck supple.     Comments: S/p left BKA  Skin:    General: Skin is warm and dry.     Findings: No erythema.  Neurological:     Mental Status: She is alert and oriented to person, place, and time. Mental status is at baseline.     Cranial Nerves: No cranial nerve deficit.     Motor: No abnormal muscle tone.  Psychiatric:        Mood and Affect: Mood and affect normal.      LABORATORY DATA:  I have reviewed the data as listed    Latest  Ref Rng & Units 11/28/2022   12:59 PM 11/21/2022    8:09 AM 11/18/2022    9:19 AM  CBC  WBC 4.0 - 10.5 K/uL 5.3  9.9  11.0   Hemoglobin 12.0 - 15.0 g/dL 16.1  09.6  04.5   Hematocrit 36.0 - 46.0 % 31.8  34.7  37.4   Platelets 150 - 400 K/uL 347  431  504       Latest Ref Rng & Units 11/28/2022   12:59 PM 11/21/2022    8:09 AM 10/29/2022    4:52 AM  CMP  Glucose 70 - 99 mg/dL 409  811  914   BUN 6 - 20 mg/dL 25  25  33   Creatinine 0.44 - 1.00 mg/dL 7.82  9.56  2.13   Sodium 135 - 145 mmol/L 138  136  133   Potassium 3.5 - 5.1 mmol/L 4.7  4.6  4.8   Chloride 98 - 111 mmol/L 101  103  98   CO2 22 - 32 mmol/L 26  24  26    Calcium 8.9 - 10.3 mg/dL 9.1  8.8  8.7   Total Protein 6.5 - 8.1 g/dL 7.2  7.4    Total Bilirubin <1.2 mg/dL 0.4  0.2    Alkaline Phos 38 - 126 U/L 40  43    AST 15 - 41 U/L 22  16    ALT 0 - 44 U/L 14  12       RADIOGRAPHIC STUDIES: I have personally reviewed the radiological images as listed and agreed with the findings in the report. CT Chest W Contrast  Result Date: 11/20/2022 CLINICAL DATA:  Peritoneal carcinomatosis; staging; * Tracking Code: BO * EXAM: CT CHEST WITH CONTRAST TECHNIQUE: Multidetector CT imaging of the chest was performed during intravenous contrast administration. RADIATION DOSE REDUCTION: This exam was performed according to the departmental dose-optimization program which includes automated exposure control, adjustment of the mA and/or kV according to patient size and/or use of iterative reconstruction technique. CONTRAST:  75mL OMNIPAQUE IOHEXOL 300 MG/ML  SOLN COMPARISON:  CT abdomen and pelvis dated October 22, 2022 FINDINGS: Cardiovascular: Normal heart size. No pericardial effusion. Normal caliber thoracic aorta with mild atherosclerotic disease. Mitral annular calcifications. Severe coronary artery calcifications. Mediastinum/Nodes: Esophagus and thyroid are unremarkable. No enlarged axillary, mediastinal or hilar lymph nodes.  Prominent subcentimeter right retrocrural lymph nodes. Reference node measuring 7 mm in short axis on series 2, image 129. Lungs/Pleura: Central airways are patent. Ground-glass nodule of the superior portion  of the right lower lobe measuring 7 mm on series 4, image 71. Small left pleural effusion. No pneumothorax. Upper Abdomen: Partially visualized large volume abdominal ascites and peritoneal thickening, volume ascites appears increased when compared with the prior CT. Musculoskeletal: No chest wall abnormality. No acute or significant osseous findings. IMPRESSION: 1. Prominent subcentimeter right retrocrural lymph nodes, metastatic disease can not be excluded. 2. Nonspecific ground-glass nodule of the superior portion of the right lower lobe measuring 7 mm. Recommend attention on follow-up. 3. Small left pleural effusion. 4. Partially visualized large volume abdominal ascites and peritoneal thickening, volume of ascites appears increased when compared with the prior CT. 5. Coronary artery calcifications and aortic Atherosclerosis (ICD10-I70.0). Electronically Signed   By: Allegra Lai M.D.   On: 11/20/2022 13:24   IR IMAGING GUIDED PORT INSERTION  Result Date: 11/18/2022 INDICATION: Chemotherapy access EXAM: Placement of chest port using ultrasound and fluoroscopic guidance MEDICATIONS: Documented in the EMR ANESTHESIA/SEDATION: Moderate (conscious) sedation was employed during this procedure. A total of Versed 2 mg and Fentanyl 125 mcg was administered intravenously. Moderate Sedation Time: 28 minutes. The patient's level of consciousness and vital signs were monitored continuously by radiology nursing throughout the procedure under my direct supervision. FLUOROSCOPY TIME:  Fluoroscopy Time: 0.8 minutes (10 mGy) COMPLICATIONS: None immediate. PROCEDURE: Informed written consent was obtained from the patient after a thorough discussion of the procedural risks, benefits and alternatives. All questions  were addressed. Maximal Sterile Barrier Technique was utilized including caps, mask, sterile gowns, sterile gloves, sterile drape, hand hygiene and skin antiseptic. A timeout was performed prior to the initiation of the procedure. The patient was placed supine on the exam table. The right neck and chest was prepped and draped in the standard sterile fashion. A preliminary ultrasound of the right neck was performed and demonstrates a patent right internal jugular vein. A permanent ultrasound image was stored in the electronic medical record. The overlying skin was anesthetized with 1% Lidocaine. Using ultrasound guidance, access was obtained into the right internal jugular vein using a 21 gauge micropuncture set. A wire was advanced into the SVC, a short incision was made at the puncture site, and serial dilatation performed. Next, in an ipsilateral infraclavicular location, an incision was made at the site of the subcutaneous reservoir. Blunt dissection was used to open a pocket to contain the reservoir. A subcutaneous tunnel was then created from the port site to the puncture site. A(n) 8 Fr single lumen catheter was advanced through the tunnel. The catheter was attached to the port and this was placed in the subcutaneous pocket. Under fluoroscopic guidance, a peel away sheath was placed, and the catheter was trimmed to the appropriate length and was advanced into the central veins. The catheter length is 23 cm. The tip of the catheter lies near the superior cavoatrial junction. A kink was noted at the apex of the central venous catheter in the neck, which was able to be corrected with blunt manipulation. The port flushes and aspirates appropriately. The port was flushed and locked with heparinized saline. The port pocket was closed in 2 layers using 3-0 and 4-0 Vicryl/absorbable suture. Dermabond was also applied to both incisions. The patient tolerated the procedure well and was transferred to recovery in stable  condition. IMPRESSION: Successful placement of a right-sided chest port via the right internal jugular vein. The port catheter tip lies near the superior cavoatrial junction. The port is ready for immediate use Note: The final image documented in PACS demonstrates  a kink at the apex of the catheter, which was corrected prior to completion of the procedure. Electronically Signed   By: Olive Bass M.D.   On: 11/18/2022 14:08   CT ABDOMINAL MASS BIOPSY  Result Date: 11/18/2022 INDICATION: Ascites with new omental caking, concern for metastatic disease EXAM: CT-guided core needle biopsy of the omentum MEDICATIONS: None. ANESTHESIA/SEDATION: Moderate (conscious) sedation was employed during this procedure. A total of Versed 1 mg and Fentanyl 50 mcg was administered intravenously. Moderate Sedation Time: 10 minutes. The patient's level of consciousness and vital signs were monitored continuously by radiology nursing throughout the procedure under my direct supervision. FLUOROSCOPY TIME:  N/a COMPLICATIONS: None immediate. PROCEDURE: Informed written consent was obtained from the patient after a thorough discussion of the procedural risks, benefits and alternatives. All questions were addressed. Maximal Sterile Barrier Technique was utilized including caps, mask, sterile gowns, sterile gloves, sterile drape, hand hygiene and skin antiseptic. A timeout was performed prior to the initiation of the procedure. The patient was placed supine on the exam table. Limited CT of the abdomen and pelvis was performed for planning purposes. This demonstrated omental thickening in the anterior mid abdomen. Skin entry site was marked, and the overlying skin was prepped and draped in the standard sterile fashion. Local analgesia was obtained with 1% lidocaine. Using intermittent CT fluoroscopy, a 17 gauge introducer needle was advanced towards the identified lesion. Subsequently, core needle biopsy was performed using an 18 gauge  core biopsy device x4 total passes. Specimens were submitted in formalin to pathology for further handling. Limited postprocedure imaging demonstrated no complicating feature. The patient tolerated the procedure well, and was transferred to recovery in stable condition. IMPRESSION: Successful CT-guided core needle biopsy of abnormally thickened omentum. Electronically Signed   By: Olive Bass M.D.   On: 11/18/2022 14:03

## 2022-11-28 NOTE — Assessment & Plan Note (Addendum)
Lab Results  Component Value Date   HGB 10.0 (L) 11/28/2022   TIBC 195 (L) 09/19/2022   IRONPCTSAT 7 (L) 09/19/2022   FERRITIN 314 (H) 09/19/2022    continue oral iron supplementation.

## 2022-11-28 NOTE — Assessment & Plan Note (Signed)
Refer to establish care with nutritionist.

## 2022-11-28 NOTE — Progress Notes (Signed)
Pt here for follow up. Pt reports that when she eats her stomach feels full and she unable to continue eating. Oxycodone is not helping with abdominal pain. She has tried dilaudid and it has worked better but she is almost out of medication. Pt also reports that 2 days after chemo she was having severe of breath for a couple of days.

## 2022-12-02 ENCOUNTER — Ambulatory Visit (INDEPENDENT_AMBULATORY_CARE_PROVIDER_SITE_OTHER): Payer: 59 | Admitting: Vascular Surgery

## 2022-12-02 ENCOUNTER — Inpatient Hospital Stay: Payer: 59

## 2022-12-02 ENCOUNTER — Encounter (INDEPENDENT_AMBULATORY_CARE_PROVIDER_SITE_OTHER): Payer: Self-pay | Admitting: Vascular Surgery

## 2022-12-02 VITALS — BP 175/82 | HR 84 | Resp 18 | Ht 69.0 in | Wt 263.0 lb

## 2022-12-02 DIAGNOSIS — I7025 Atherosclerosis of native arteries of other extremities with ulceration: Secondary | ICD-10-CM

## 2022-12-02 MED ORDER — GABAPENTIN 300 MG PO CAPS
300.0000 mg | ORAL_CAPSULE | Freq: Every evening | ORAL | 3 refills | Status: DC | PRN
Start: 2022-12-02 — End: 2023-06-03

## 2022-12-02 NOTE — Progress Notes (Signed)
[facility] Clinical Social Work  Initial Assessment   Nancy [name] is a 57 y.o. year old female contacted by phone. Clinical Social Work was referred by medical provider for assessment of psychosocial needs.   SDOH (Social Determinants of Health) assessments performed: Yes SDOH Interventions    Flowsheet Row ED to Hosp-Admission (Discharged) from 10/22/2022 in [facility]  SDOH Interventions   Housing Interventions Other (Comment), Inpatient TOC  [resources added to avs]       SDOH Screenings   Food Insecurity: No Food Insecurity (10/22/2022)  Housing: High Risk (10/22/2022)  Transportation Needs: No Transportation Needs (10/22/2022)  Utilities: Not At Risk (10/22/2022)  Depression (PHQ2-9): Low Risk  (10/10/2022)  Tobacco Use: Medium Risk (12/02/2022)     Distress Screen completed: No    11/06/2022    8:43 AM  ONCBCN DISTRESS SCREENING  Screening Type Initial Screening  Distress experienced in past week (1-10) 0      Family/Social Information:  Housing Arrangement: patient lives with her wife, Nancy [name]. Family members/support persons in your life? Family Transportation concerns: no  Employment: Legally disabled  Income source: Secretary/administrator concerns: Yes, due to illness and/or loss of work during treatment Type of concern: Utilities, Government social research officer, Medical bills, and Food Food access concerns: yes Religious or spiritual practice: No Services Currently in place:  [place], food stamps and SSD.  Coping/ Adjustment to diagnosis: Patient understands treatment plan and what happens next? yes Concerns about diagnosis and/or treatment: How I will pay for the services I need Patient reported stressors: Finances Hopes and/or priorities: Family. Current coping skills/ strengths: Active sense of humor , Communication skills , General fund of knowledge , and Supportive family/friends     SUMMARY: Current SDOH  Barriers:  Financial constraints related to fixed income.  Clinical Social Work Clinical Goal(s):  Scientist, research (life sciences) options for unmet needs related to:  Financial Strain   Interventions: Discussed common feeling and emotions when being diagnosed with cancer, and the importance of support during treatment Informed patient of the support team roles and support services at [facility] Provided CSW contact information and encouraged patient to call with any questions or concerns Provided patient with information about the [employer], the cancer center food pantry and grants.  Securely emailed patient the aforementioned contact information.   She also said she was interested in counseling.   Follow Up Plan: CSW will see patient on Friday, December 6th at 11am while she is in infusion. Patient verbalizes understanding of plan: Yes    Nancy Lux Renelle Stegenga, LCSW Clinical Social Worker [facility]

## 2022-12-02 NOTE — Progress Notes (Signed)
Patient ID: Nancy [name], female   DOB: 1965-07-28, 57 y.o.   MRN: [id number]  No chief complaint on file.   HPI Nancy [name] is a 57 y.o. female.    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  She is complaining of some phantom pain but overall seems to be doing very well seem very motivated. Past Medical History:  Diagnosis Date   Abnormal cardiovascular stress test 09/21/2018   Formatting of this note might be different from the original. Lexiscan Myoview 09/16/2018 revealed mild anterior ischemia   Adverse effect of motion 05/10/2014   Anxiety    Arthritis    r knee   Asthma    Breast cyst 05/10/2014   CAD (coronary artery disease)    a.) LHC 06/04/2009 at [facility]; non-obstructive CAD. b.) CTA with FFR 10/08/2018: extensive mixed plaque proximal to mid LAD (51-69%); Coronary Ca score 217; FFR 0.71 dPDA, 0.86 mLAD, 0.87 dLCx.   CCF (congestive cardiac failure) (HCC) 06/06/2008   a.) 30% EF. b.) TTE 06/03/2011: EF >55%; triv MR. c.) TTE 04/27/2018: EF 55%; mild LVH; triv PR, mild MR/TR; G1DD.   Chest pain with high risk for cardiac etiology 07/02/2016   Chronic use of opiate drug for therapeutic purpose 02/13/2022   Formatting of this note might be different from the original. [facility] Pain Contract signed on 04/18/16 & updated 03/04/17; UDS done on 04/18/16.   Complication of anesthesia    Diabetic foot infection (HCC) 03/13/2015   Diabetic foot ulcer (HCC) 02/08/2021   Eczema    Family history of adverse reaction to anesthesia    a.) PONV in mother and grandmother   GERD (gastroesophageal reflux disease) 05/10/2014   History of kidney stones    HLD (hyperlipidemia)    Hyponatremia 03/13/2015   Migraines    Motion sickness    Other acute osteomyelitis, left ankle and foot (HCC) 02/13/2022   Panic attacks    Peripheral edema 04/27/2018   Pneumonia    PONV  (postoperative nausea and vomiting)    Sepsis secondary to diabetic foot infection 03/13/2015   T2DM (type 2 diabetes mellitus) (HCC)    Takotsubo cardiomyopathy / transient apical balooning syndrome / stress-induced cardiomyopathy 05/10/2014   Unspecified essential hypertension     Past Surgical History:  Procedure Laterality Date   AMPUTATION Left 09/19/2022   Procedure: AMPUTATION BELOW KNEE;  Surgeon: Renford Dills, MD;  Location: [facility];  Service: Vascular;  Laterality: Left;   AMPUTATION Left 09/24/2022   Procedure: AMPUTATION BELOW KNEE WITH WOUND CLOSURE;  Surgeon: Renford Dills, MD;  Location: [facility];  Service: Vascular;  Laterality: Left;   AMPUTATION TOE Left 03/16/2015   Procedure: left fifth toe amputation with incision and drainage;  Surgeon: Gwyneth Revels, DPM;  Location: [facility];  Service: Podiatry;  Laterality: Left;   APPLICATION OF WOUND VAC Left 02/14/2021   Procedure: APPLICATION OF WOUND VAC;  Surgeon: Gwyneth Revels, DPM;  Location: [facility];  Service: Podiatry;  Laterality: Left;   APPLICATION OF WOUND VAC Left 04/16/2021   Procedure: APPLICATION OF WOUND VAC;  Surgeon: Peggye Form, DO;  Location: [facility];  Service: Plastics;  Laterality: Left;   CARDIAC CATHETERIZATION Left 06/04/2009   Procedure: CARDIAC CATHETERIZATION; Location: [facility]   CHOLECYSTECTOMY     DEBRIDEMENT AND CLOSURE WOUND Left 04/16/2021   Procedure: DEBRIDEMENT  AND CLOSURE WOUND;  Surgeon: Peggye Form, DO;  Location: [facility];  Service: Plastics;  Laterality: Left;  1 hour   IR IMAGING GUIDED PORT INSERTION  11/18/2022   IRRIGATION AND DEBRIDEMENT FOOT Left 02/08/2021   Procedure: IRRIGATION AND DEBRIDEMENT FOOT - LFT HEEL ULCER;  Surgeon: Gwyneth Revels, DPM;  Location: [facility];  Service: Podiatry;  Laterality: Left;   IRRIGATION AND DEBRIDEMENT FOOT Left 02/14/2021   Procedure: IRRIGATION AND DEBRIDEMENT LEFT HEEL;   Surgeon: Gwyneth Revels, DPM;  Location: [facility];  Service: Podiatry;  Laterality: Left;   kidney stone removal     KNEE ARTHROSCOPY W/ MENISCAL REPAIR     NOSE SURGERY  01/07/1989   due to fracture   TEE WITHOUT CARDIOVERSION N/A 09/26/2022   Procedure: TRANSESOPHAGEAL ECHOCARDIOGRAM;  Surgeon: Clotilde Dieter, DO;  Location: [facility];  Service: Cardiovascular;  Laterality: N/A;   TOE AMPUTATION     second toe   TONSILLECTOMY     TOTAL KNEE ARTHROPLASTY  01/11/2011   Procedure: TOTAL KNEE ARTHROPLASTY;  Surgeon: Harvie Junior;  Location: [facility];  Service: Orthopedics;  Laterality: Right;  COMPUTER ASSISTED TOTAL KNEE REPLACEMENT      Allergies  Allergen Reactions   Shellfish Allergy Anaphylaxis   Codeine Other (See Comments)    Migraine  Other Reaction(s): Other (See Comments)  Reaction:  Severe migraines   Morphine And Codeine Other (See Comments)    Reaction:  Severe migraines    Rosuvastatin     Other reaction(s): Muscle Pain   Percocet [Oxycodone-Acetaminophen] Nausea And Vomiting   Sulfa Antibiotics Rash   Theophyllines Itching and Rash    Current Outpatient Medications  Medication Sig Dispense Refill   acidophilus (RISAQUAD) CAPS capsule Take 1 capsule by mouth daily.     albuterol (PROVENTIL HFA;VENTOLIN HFA) 108 (90 Base) MCG/ACT inhaler Inhale 2 puffs into the lungs every 4 (four) hours as needed for wheezing or shortness of breath. Reported on 03/29/2015 1 Inhaler 3   albuterol (PROVENTIL) (2.5 MG/3ML) 0.083% nebulizer solution Take 2.5 mg by nebulization as needed.     ALPRAZolam (XANAX) 0.5 MG tablet TAKE 1/2 TO 1 TABLET BY MOUTH AS NEEDED FOR PANIC ATTACKS 30 tablet 3   apixaban (ELIQUIS) 2.5 MG TABS tablet Take 1 tablet (2.5 mg total) by mouth 2 (two) times daily. 60 tablet 2   ascorbic acid (VITAMIN C) 500 MG tablet Take 1 tablet (500 mg total) by mouth 2 (two) times daily. 60 tablet 0   aspirin EC 81 MG tablet Take 81 mg by mouth daily.     carvedilol  (COREG) 6.25 MG tablet TAKE 1 TABLET BY MOUTH TWICE A DAY 180 tablet 1   cholecalciferol (VITAMIN D3) 25 MCG (1000 UNIT) tablet Take 1,000 Units by mouth daily.     Continuous Glucose Sensor (FREESTYLE LIBRE 2 SENSOR) MISC 1 each by Does not apply route every 14 (fourteen) days. 2 each 3   Cyanocobalamin (VITAMIN B 12) 500 MCG TABS Take by mouth daily at 8 pm.     desvenlafaxine (PRISTIQ) 100 MG 24 hr tablet TAKE 1 TABLET BY MOUTH EVERY DAY 90 tablet 2   dexamethasone (DECADRON) 4 MG tablet Take 2 tablets (8mg ) by mouth daily starting the day after carboplatin for 2 days. Take with food 30 tablet 1   dicyclomine (BENTYL) 10 MG capsule Take 1 capsule (10 mg total) by mouth 4 (four) times daily -  before meals and at bedtime. 60 capsule 1  Ensure Max Protein (ENSURE MAX PROTEIN) LIQD Take 330 mLs (11 oz total) by mouth 2 (two) times daily.     fenofibrate 54 MG tablet TAKE 1 TABLET BY MOUTH EVERY DAY 90 tablet 3   fluconazole (DIFLUCAN) 200 MG tablet Take 1 tablet (200 mg total) by mouth daily. 30 tablet 3   HYDROmorphone (DILAUDID) 2 MG tablet Take 1 tablet (2 mg total) by mouth every 4 (four) hours as needed for severe pain (pain score 7-10). 60 tablet 0   insulin NPH-regular Human (70-30) 100 UNIT/ML injection Inject 15 Units into the skin in the morning and at bedtime.     iron polysaccharides (NIFEREX) 150 MG capsule Take 1 capsule (150 mg total) by mouth daily. 30 capsule 2   lidocaine-prilocaine (EMLA) cream Apply to affected area once 30 g 3   lisinopril (ZESTRIL) 5 MG tablet Take 5 mg by mouth 2 (two) times daily.     metFORMIN (GLUCOPHAGE) 500 MG tablet TAKE TWO TABLETS EACH MORNING. AND 3 TABLETS IN THE EVENING AS DIRECTED 150 tablet 5   methocarbamol (ROBAXIN) 500 MG tablet Take 1 tablet (500 mg total) by mouth every 8 (eight) hours as needed for muscle spasms. 30 tablet 0   montelukast (SINGULAIR) 10 MG tablet TAKE 1 TABLET BY MOUTH EVERYDAY AT BEDTIME 90 tablet 1   Multiple Vitamin  (MULTIVITAMIN WITH MINERALS) TABS tablet Take 1 tablet by mouth daily. 30 tablet 2   naloxone (NARCAN) nasal spray 4 mg/0.1 mL SPRAY 1 SPRAY INTO ONE NOSTRIL AS DIRECTED FOR OPIOID OVERDOSE (TURN PERSON ON SIDE AFTER DOSE. IF NO RESPONSE IN 2-3 MINUTES OR PERSON RESPONDS BUT RELAPSES, REPEAT USING A NEW SPRAY DEVICE AND SPRAY INTO THE OTHER NOSTRIL. CALL 911 AFTER USE.) * EMERGENCY USE ONLY * 1 each 0   nystatin (MYCOSTATIN/NYSTOP) powder APPLY TOPICALLY TWICE DAILY AS NEEDED 30 g 3   nystatin ointment (MYCOSTATIN) Apply 1 Application topically 2 (two) times daily. 30 g 0   ondansetron (ZOFRAN) 8 MG tablet Take 1 tablet (8 mg total) by mouth every 8 (eight) hours as needed for nausea or vomiting. Start on the third day after carboplatin. 30 tablet 1   ondansetron (ZOFRAN-ODT) 8 MG disintegrating tablet Take 1 tablet (8 mg total) by mouth every 8 (eight) hours as needed for nausea or vomiting. 90 tablet 3   pantoprazole (PROTONIX) 40 MG tablet TAKE 1 TABLET BY MOUTH EVERY DAY 90 tablet 3   pravastatin (PRAVACHOL) 40 MG tablet TAKE 1 TABLET BY MOUTH EVERY DAY 30 tablet 5   prochlorperazine (COMPAZINE) 10 MG tablet Take 1 tablet (10 mg total) by mouth every 6 (six) hours as needed for nausea or vomiting. 30 tablet 1   zinc sulfate 220 (50 Zn) MG capsule Take 1 capsule (220 mg total) by mouth daily. 30 capsule 0   No current facility-administered medications for this visit.        Physical Exam LMP 02/08/2013 (Within Months)  Gen:  WD/WN, NAD Skin: incision C/D/I; minimal edema of the left leg     Assessment/Plan: 1. Atherosclerosis of native arteries of the extremities with ulceration [facility]) Patient has done well status post left below-knee amputation.  Her incision looks very well-healed.  She is complaining of some neuropathic pain.  At this point we can move forward with her prosthesis and I will contact Judy Pimple at [place].  I will also prescribe Neurontin at bedtime for her phantom  pains.  She will follow-up with me in 6  months      [name] 12/02/2022, 8:41 AM   This note was created with Dragon medical transcription system.  Any errors from dictation are unintentional.

## 2022-12-03 ENCOUNTER — Encounter: Payer: Self-pay | Admitting: Oncology

## 2022-12-06 ENCOUNTER — Other Ambulatory Visit: Payer: Self-pay | Admitting: Hospice and Palliative Medicine

## 2022-12-10 ENCOUNTER — Ambulatory Visit: Payer: 59 | Admitting: Family

## 2022-12-11 ENCOUNTER — Other Ambulatory Visit: Payer: Self-pay | Admitting: Family

## 2022-12-11 DIAGNOSIS — C482 Malignant neoplasm of peritoneum, unspecified: Secondary | ICD-10-CM | POA: Diagnosis not present

## 2022-12-11 DIAGNOSIS — F4323 Adjustment disorder with mixed anxiety and depressed mood: Secondary | ICD-10-CM | POA: Diagnosis not present

## 2022-12-12 MED FILL — Fosaprepitant Dimeglumine For IV Infusion 150 MG (Base Eq): INTRAVENOUS | Qty: 5 | Status: AC

## 2022-12-13 ENCOUNTER — Inpatient Hospital Stay: Payer: 59 | Attending: Oncology

## 2022-12-13 ENCOUNTER — Inpatient Hospital Stay: Payer: 59

## 2022-12-13 ENCOUNTER — Encounter: Payer: Self-pay | Admitting: Oncology

## 2022-12-13 ENCOUNTER — Inpatient Hospital Stay (HOSPITAL_BASED_OUTPATIENT_CLINIC_OR_DEPARTMENT_OTHER): Payer: 59 | Admitting: Hospice and Palliative Medicine

## 2022-12-13 ENCOUNTER — Inpatient Hospital Stay (HOSPITAL_BASED_OUTPATIENT_CLINIC_OR_DEPARTMENT_OTHER): Payer: 59 | Admitting: Oncology

## 2022-12-13 VITALS — BP 187/69 | HR 75 | Temp 97.8°F | Resp 18 | Wt 248.0 lb

## 2022-12-13 VITALS — BP 190/70 | HR 69

## 2022-12-13 DIAGNOSIS — Z515 Encounter for palliative care: Secondary | ICD-10-CM | POA: Diagnosis not present

## 2022-12-13 DIAGNOSIS — C482 Malignant neoplasm of peritoneum, unspecified: Secondary | ICD-10-CM

## 2022-12-13 DIAGNOSIS — R180 Malignant ascites: Secondary | ICD-10-CM | POA: Diagnosis not present

## 2022-12-13 DIAGNOSIS — G893 Neoplasm related pain (acute) (chronic): Secondary | ICD-10-CM

## 2022-12-13 DIAGNOSIS — H538 Other visual disturbances: Secondary | ICD-10-CM | POA: Diagnosis not present

## 2022-12-13 DIAGNOSIS — D509 Iron deficiency anemia, unspecified: Secondary | ICD-10-CM | POA: Diagnosis not present

## 2022-12-13 DIAGNOSIS — R110 Nausea: Secondary | ICD-10-CM

## 2022-12-13 DIAGNOSIS — T451X5A Adverse effect of antineoplastic and immunosuppressive drugs, initial encounter: Secondary | ICD-10-CM | POA: Diagnosis not present

## 2022-12-13 DIAGNOSIS — Z5111 Encounter for antineoplastic chemotherapy: Secondary | ICD-10-CM | POA: Diagnosis not present

## 2022-12-13 DIAGNOSIS — D508 Other iron deficiency anemias: Secondary | ICD-10-CM | POA: Diagnosis not present

## 2022-12-13 DIAGNOSIS — E440 Moderate protein-calorie malnutrition: Secondary | ICD-10-CM

## 2022-12-13 LAB — CBC WITH DIFFERENTIAL (CANCER CENTER ONLY)
Abs Immature Granulocytes: 0.04 10*3/uL (ref 0.00–0.07)
Basophils Absolute: 0.1 10*3/uL (ref 0.0–0.1)
Basophils Relative: 1 %
Eosinophils Absolute: 0.2 10*3/uL (ref 0.0–0.5)
Eosinophils Relative: 2 %
HCT: 30.4 % — ABNORMAL LOW (ref 36.0–46.0)
Hemoglobin: 9.7 g/dL — ABNORMAL LOW (ref 12.0–15.0)
Immature Granulocytes: 1 %
Lymphocytes Relative: 30 %
Lymphs Abs: 2.1 10*3/uL (ref 0.7–4.0)
MCH: 28.3 pg (ref 26.0–34.0)
MCHC: 31.9 g/dL (ref 30.0–36.0)
MCV: 88.6 fL (ref 80.0–100.0)
Monocytes Absolute: 0.6 10*3/uL (ref 0.1–1.0)
Monocytes Relative: 8 %
Neutro Abs: 4.1 10*3/uL (ref 1.7–7.7)
Neutrophils Relative %: 58 %
Platelet Count: 391 10*3/uL (ref 150–400)
RBC: 3.43 MIL/uL — ABNORMAL LOW (ref 3.87–5.11)
RDW: 14.8 % (ref 11.5–15.5)
WBC Count: 7.1 10*3/uL (ref 4.0–10.5)
nRBC: 0 % (ref 0.0–0.2)

## 2022-12-13 LAB — CMP (CANCER CENTER ONLY)
ALT: 15 U/L (ref 0–44)
AST: 15 U/L (ref 15–41)
Albumin: 3.3 g/dL — ABNORMAL LOW (ref 3.5–5.0)
Alkaline Phosphatase: 45 U/L (ref 38–126)
Anion gap: 9 (ref 5–15)
BUN: 21 mg/dL — ABNORMAL HIGH (ref 6–20)
CO2: 27 mmol/L (ref 22–32)
Calcium: 9.2 mg/dL (ref 8.9–10.3)
Chloride: 101 mmol/L (ref 98–111)
Creatinine: 0.7 mg/dL (ref 0.44–1.00)
GFR, Estimated: >60 mL/min (ref >60)
Glucose, Bld: 136 mg/dL — ABNORMAL HIGH (ref 70–99)
Potassium: 4 mmol/L (ref 3.5–5.1)
Sodium: 137 mmol/L (ref 135–145)
Total Bilirubin: 0.2 mg/dL (ref <1.2)
Total Protein: 7.5 g/dL (ref 6.5–8.1)

## 2022-12-13 MED ORDER — SODIUM CHLORIDE 0.9 % IV SOLN
750.0000 mg | Freq: Once | INTRAVENOUS | Status: AC
  Administered 2022-12-13: 750 mg via INTRAVENOUS
  Filled 2022-12-13: qty 75

## 2022-12-13 MED ORDER — DIPHENHYDRAMINE HCL 50 MG/ML IJ SOLN
50.0000 mg | Freq: Once | INTRAMUSCULAR | Status: AC
  Administered 2022-12-13: 50 mg via INTRAVENOUS
  Filled 2022-12-13: qty 1

## 2022-12-13 MED ORDER — SODIUM CHLORIDE 0.9 % IV SOLN
175.0000 mg/m2 | Freq: Once | INTRAVENOUS | Status: AC
  Administered 2022-12-13: 360 mg via INTRAVENOUS
  Filled 2022-12-13: qty 60

## 2022-12-13 MED ORDER — PALONOSETRON HCL INJECTION 0.25 MG/5ML
0.2500 mg | Freq: Once | INTRAVENOUS | Status: AC
  Administered 2022-12-13: 0.25 mg via INTRAVENOUS
  Filled 2022-12-13: qty 5

## 2022-12-13 MED ORDER — DEXAMETHASONE SODIUM PHOSPHATE 10 MG/ML IJ SOLN
10.0000 mg | Freq: Once | INTRAMUSCULAR | Status: AC
  Administered 2022-12-13: 10 mg via INTRAVENOUS
  Filled 2022-12-13: qty 1

## 2022-12-13 MED ORDER — HEPARIN SOD (PORK) LOCK FLUSH 100 UNIT/ML IV SOLN
500.0000 [IU] | Freq: Once | INTRAVENOUS | Status: AC | PRN
  Administered 2022-12-13: 500 [IU]
  Filled 2022-12-13: qty 5

## 2022-12-13 MED ORDER — SODIUM CHLORIDE 0.9 % IV SOLN
INTRAVENOUS | Status: DC
  Filled 2022-12-13: qty 250

## 2022-12-13 MED ORDER — FAMOTIDINE IN NACL 20-0.9 MG/50ML-% IV SOLN
20.0000 mg | Freq: Once | INTRAVENOUS | Status: AC
  Administered 2022-12-13: 20 mg via INTRAVENOUS
  Filled 2022-12-13: qty 50

## 2022-12-13 MED ORDER — SODIUM CHLORIDE 0.9 % IV SOLN
150.0000 mg | Freq: Once | INTRAVENOUS | Status: AC
  Administered 2022-12-13: 150 mg via INTRAVENOUS
  Filled 2022-12-13: qty 150

## 2022-12-13 NOTE — Assessment & Plan Note (Addendum)
She has tried both tramadol and oxycodone, which did not help her pain.  Follow up with palliative care service for further management. - now on hydromorphone 2 mg every 4 hours as needed

## 2022-12-13 NOTE — Progress Notes (Signed)
Palliative Medicine [facility] at [facility] [phone] [phone]   Name: Nancy [name] Date: 12/13/2022 MRN: [id number]  DOB: 11-12-1965  Patient Care Team: Miki Kins, FNP as PCP - General (Family Medicine) Benita Gutter, RN as Oncology Nurse Navigator Rickard Patience, MD as Consulting Physician (Oncology)    REASON FOR CONSULTATION: Nancy [name] is a 57 y.o. female with multiple medical problems including primary peritoneal carcinomatosis.  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  Patient has had ongoing pain.  She is referred to palliative care to address goals and manage ongoing symptoms.  SOCIAL HISTORY:     reports that she quit smoking about 37 years ago. Her smoking use included cigarettes. She has never used smokeless tobacco. She reports that she does not drink alcohol and does not use drugs.  Patient is married and lives at home with her wife.  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  Patient is now disabled.  ADVANCE DIRECTIVES:    CODE STATUS:   PAST MEDICAL HISTORY: Past Medical History:  Diagnosis Date   Abnormal cardiovascular stress test 09/21/2018   Formatting of this note might be different from the original. Lexiscan Myoview 09/16/2018 revealed mild anterior ischemia   Adverse effect of motion 05/10/2014   Anxiety    Arthritis    r knee   Asthma    Breast cyst 05/10/2014   CAD (coronary artery disease)    a.) LHC 06/04/2009 at [facility]; non-obstructive CAD. b.) CTA with FFR 10/08/2018: extensive mixed plaque proximal to mid LAD (51-69%); Coronary Ca score 217; FFR 0.71 dPDA, 0.86 mLAD, 0.87 dLCx.   CCF (congestive cardiac failure) (HCC) 06/06/2008   a.) 30% EF. b.) TTE 06/03/2011: EF >55%; triv MR. c.) TTE 04/27/2018: EF 55%; mild LVH; triv PR, mild MR/TR; G1DD.   Chest pain with high risk for cardiac etiology 07/02/2016   Chronic use of opiate  drug for therapeutic purpose 02/13/2022   Formatting of this note might be different from the original. [facility] Pain Contract signed on 04/18/16 & updated 03/04/17; UDS done on 04/18/16.   Complication of anesthesia    Diabetic foot infection (HCC) 03/13/2015   Diabetic foot ulcer (HCC) 02/08/2021   Eczema    Family history of adverse reaction to anesthesia    a.) PONV in mother and grandmother   GERD (gastroesophageal reflux disease) 05/10/2014   History of kidney stones    HLD (hyperlipidemia)    Hyponatremia 03/13/2015   Migraines    Motion sickness    Other acute osteomyelitis, left ankle and foot (HCC) 02/13/2022   Panic attacks    Peripheral edema 04/27/2018   Pneumonia    PONV (postoperative nausea and vomiting)    Sepsis secondary to diabetic foot infection 03/13/2015   T2DM (type 2 diabetes mellitus) (HCC)    Takotsubo cardiomyopathy / transient apical balooning syndrome / stress-induced cardiomyopathy 05/10/2014   Unspecified essential hypertension     PAST SURGICAL HISTORY:  Past Surgical History:  Procedure Laterality Date   AMPUTATION Left 09/19/2022   Procedure: AMPUTATION BELOW KNEE;  Surgeon: Renford Dills, MD;  Location: [facility];  Service: Vascular;  Laterality: Left;   AMPUTATION Left 09/24/2022   Procedure: AMPUTATION BELOW KNEE WITH WOUND CLOSURE;  Surgeon: Renford Dills, MD;  Location: [facility];  Service: Vascular;  Laterality: Left;   AMPUTATION TOE Left 03/16/2015   Procedure: left fifth toe amputation with incision  and drainage;  Surgeon: Gwyneth Revels, DPM;  Location: [facility];  Service: Podiatry;  Laterality: Left;   APPLICATION OF WOUND VAC Left 02/14/2021   Procedure: APPLICATION OF WOUND VAC;  Surgeon: Gwyneth Revels, DPM;  Location: [facility];  Service: Podiatry;  Laterality: Left;   APPLICATION OF WOUND VAC Left 04/16/2021   Procedure: APPLICATION OF WOUND VAC;  Surgeon: Peggye Form, DO;  Location: [facility];  Service:  Plastics;  Laterality: Left;   CARDIAC CATHETERIZATION Left 06/04/2009   Procedure: CARDIAC CATHETERIZATION; Location: [facility]   CHOLECYSTECTOMY     DEBRIDEMENT AND CLOSURE WOUND Left 04/16/2021   Procedure: DEBRIDEMENT AND CLOSURE WOUND;  Surgeon: Peggye Form, DO;  Location: [facility];  Service: Plastics;  Laterality: Left;  1 hour   IR IMAGING GUIDED PORT INSERTION  11/18/2022   IRRIGATION AND DEBRIDEMENT FOOT Left 02/08/2021   Procedure: IRRIGATION AND DEBRIDEMENT FOOT - LFT HEEL ULCER;  Surgeon: Gwyneth Revels, DPM;  Location: [facility];  Service: Podiatry;  Laterality: Left;   IRRIGATION AND DEBRIDEMENT FOOT Left 02/14/2021   Procedure: IRRIGATION AND DEBRIDEMENT LEFT HEEL;  Surgeon: Gwyneth Revels, DPM;  Location: [facility];  Service: Podiatry;  Laterality: Left;   kidney stone removal     KNEE ARTHROSCOPY W/ MENISCAL REPAIR     NOSE SURGERY  01/07/1989   due to fracture   TEE WITHOUT CARDIOVERSION N/A 09/26/2022   Procedure: TRANSESOPHAGEAL ECHOCARDIOGRAM;  Surgeon: Clotilde Dieter, DO;  Location: [facility];  Service: Cardiovascular;  Laterality: N/A;   TOE AMPUTATION     second toe   TONSILLECTOMY     TOTAL KNEE ARTHROPLASTY  01/11/2011   Procedure: TOTAL KNEE ARTHROPLASTY;  Surgeon: Harvie Junior;  Location: [facility];  Service: Orthopedics;  Laterality: Right;  COMPUTER ASSISTED TOTAL KNEE REPLACEMENT    HEMATOLOGY/ONCOLOGY HISTORY:  Oncology History  Primary peritoneal carcinomatosis (HCC)  11/02/2022 Imaging   Large volume ascites in the abdomen or pelvis with omental caking.Appearance is concerning for peritoneal malignancy/carcinomatosis,often seen with ovarian cancer, but no ovarian mass visualized. Aortic atherosclerosis    11/06/2022 Initial Diagnosis   Primary peritoneal carcinomatosis   -presented to emergency room for evaluation of abdominal pain/bloating, poor oral intake, nonbloody nonbilious vomiting  Patient underwent  paracentesis, cytology showed malignant cells. IHC can not be added.    11/14/2022 Imaging   CT chest w contrast  1. Prominent subcentimeter right retrocrural lymph nodes, metastatic disease can not be excluded. 2. Nonspecific ground-glass nodule of the superior portion of the right lower lobe measuring 7 mm. Recommend attention on follow-up. 3. Small left pleural effusion. 4. Partially visualized large volume abdominal ascites and peritoneal thickening, volume of ascites appears increased when compared with the prior CT. 5. Coronary artery calcifications and aortic Atherosclerosis (ICD10-I70.0).   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 The report date is 11/25/2022.  The CancerNext-Expanded + RNAinsight gene panel offered by [employer] and includes sequencing and rearrangement analysis for the following 71 genes: AIP, ALK, APC*, ATM*, AXIN2, BAP1, BARD1, BMPR1A, BRCA1*, BRCA2*, BRIP1*, CDC73, CDH1*,CDK4, CDKN1B, CDKN2A, CHEK2*, CTNNA1, DICER1, FH, FLCN, KIF1B, LZTR1, MAX, MEN1, MET, MLH1*, MSH2*, MSH3, MSH6*, MUTYH*, NF1*, NF2, NTHL1, PALB2*, PHOX2B, PMS2*, POT1, PRKAR1A, PTCH1, PTEN*, RAD51C*,  RAD51D*,RB1, RET, SDHA, SDHAF2, SDHB, SDHC, SDHD, SMAD4, SMARCA4, SMARCB1, SMARCE1, STK11, SUFU, TMEM127, TP53*,TSC1, TSC2, VHL; EGFR, EGLN1, HOXB13, KIT, MITF, PDGFRA, POLD1 and POLE (sequencing only); EPCAM and GREM1 (deletion/duplication only).      ALLERGIES:  is allergic to shellfish allergy, codeine, morphine and codeine, rosuvastatin, percocet [oxycodone-acetaminophen], sulfa antibiotics, and theophyllines.  MEDICATIONS:  Current Outpatient Medications  Medication Sig Dispense Refill   acidophilus (RISAQUAD) CAPS capsule Take 1 capsule by mouth daily.      albuterol (PROVENTIL HFA;VENTOLIN HFA) 108 (90 Base) MCG/ACT inhaler Inhale 2 puffs into the lungs every 4 (four) hours as needed for wheezing or shortness of breath. Reported on 03/29/2015 1 Inhaler 3   albuterol (PROVENTIL) (2.5 MG/3ML) 0.083% nebulizer solution Take 2.5 mg by nebulization as needed.     ALPRAZolam (XANAX) 0.5 MG tablet TAKE 1/2 TO 1 TABLET BY MOUTH AS NEEDED FOR PANIC ATTACKS 30 tablet 3   apixaban (ELIQUIS) 2.5 MG TABS tablet Take 1 tablet (2.5 mg total) by mouth 2 (two) times daily. 60 tablet 2   ascorbic acid (VITAMIN C) 500 MG tablet Take 1 tablet (500 mg total) by mouth 2 (two) times daily. 60 tablet 0   aspirin EC 81 MG tablet Take 81 mg by mouth daily.     carvedilol (COREG) 6.25 MG tablet TAKE 1 TABLET BY MOUTH TWICE A DAY 180 tablet 1   cholecalciferol (VITAMIN D3) 25 MCG (1000 UNIT) tablet Take 1,000 Units by mouth daily.     Continuous Glucose Sensor (FREESTYLE LIBRE 2 SENSOR) MISC 1 each by Does not apply route every 14 (fourteen) days. 2 each 3   Cyanocobalamin (VITAMIN B 12) 500 MCG TABS Take by mouth daily at 8 pm.     desvenlafaxine (PRISTIQ) 100 MG 24 hr tablet TAKE 1 TABLET BY MOUTH EVERY DAY 90 tablet 2   dexamethasone (DECADRON) 4 MG tablet Take 2 tablets (8mg ) by mouth daily starting the day after carboplatin for 2 days. Take with food 30 tablet 1   dicyclomine (BENTYL) 10 MG capsule Take 1 capsule (10 mg total) by mouth 4 (four) times daily -  before meals and at bedtime. 60 capsule 1   Ensure Max Protein (ENSURE MAX PROTEIN) LIQD Take 330 mLs (11 oz total) by mouth 2 (two) times daily.     fenofibrate 54 MG tablet TAKE 1 TABLET BY MOUTH EVERY DAY 90 tablet 3   fluconazole (DIFLUCAN) 200 MG tablet Take 1 tablet (200 mg total) by mouth daily. 30 tablet 3   gabapentin (NEURONTIN) 300 MG capsule Take 1 capsule (300 mg total) by mouth at bedtime as needed. 30 capsule 3   HYDROmorphone (DILAUDID) 2 MG tablet Take 1 tablet (2 mg total) by mouth every 4 (four)  hours as needed for severe pain (pain score 7-10). 60 tablet 0   ibuprofen (ADVIL) 800 MG tablet TAKE 1 TABLET BY MOUTH EVERY 8 HOURS AS NEEDED 90 tablet 3   insulin NPH-regular Human (70-30) 100 UNIT/ML injection Inject 15 Units into the skin in the morning and at bedtime.     iron polysaccharides (NIFEREX) 150 MG capsule Take 1 capsule (150 mg total) by mouth daily. 30 capsule 2   lidocaine-prilocaine (EMLA) cream Apply to affected area once 30 g 3   lisinopril (ZESTRIL) 5 MG tablet Take 5 mg by mouth 2 (two) times daily.     metFORMIN (GLUCOPHAGE) 500 MG tablet TAKE TWO TABLETS EACH MORNING. AND 3 TABLETS IN THE EVENING AS DIRECTED 150 tablet  5   methocarbamol (ROBAXIN) 500 MG tablet Take 1 tablet (500 mg total) by mouth every 8 (eight) hours as needed for muscle spasms. 30 tablet 0   montelukast (SINGULAIR) 10 MG tablet TAKE 1 TABLET BY MOUTH EVERYDAY AT BEDTIME 90 tablet 1   Multiple Vitamin (MULTIVITAMIN WITH MINERALS) TABS tablet Take 1 tablet by mouth daily. 30 tablet 2   naloxone (NARCAN) nasal spray 4 mg/0.1 mL SPRAY 1 SPRAY INTO ONE NOSTRIL AS DIRECTED FOR OPIOID OVERDOSE (TURN PERSON ON SIDE AFTER DOSE. IF NO RESPONSE IN 2-3 MINUTES OR PERSON RESPONDS BUT RELAPSES, REPEAT USING A NEW SPRAY DEVICE AND SPRAY INTO THE OTHER NOSTRIL. CALL 911 AFTER USE.) * EMERGENCY USE ONLY * 1 each 0   nystatin (MYCOSTATIN/NYSTOP) powder APPLY TOPICALLY TWICE DAILY AS NEEDED 30 g 3   nystatin ointment (MYCOSTATIN) Apply 1 Application topically 2 (two) times daily. 30 g 0   ondansetron (ZOFRAN) 8 MG tablet Take 1 tablet (8 mg total) by mouth every 8 (eight) hours as needed for nausea or vomiting. Start on the third day after carboplatin. 30 tablet 1   ondansetron (ZOFRAN-ODT) 8 MG disintegrating tablet Take 1 tablet (8 mg total) by mouth every 8 (eight) hours as needed for nausea or vomiting. 90 tablet 3   pantoprazole (PROTONIX) 40 MG tablet TAKE 1 TABLET BY MOUTH EVERY DAY 90 tablet 3   pravastatin  (PRAVACHOL) 40 MG tablet TAKE 1 TABLET BY MOUTH EVERY DAY 30 tablet 5   prochlorperazine (COMPAZINE) 10 MG tablet Take 1 tablet (10 mg total) by mouth every 6 (six) hours as needed for nausea or vomiting. 30 tablet 1   zinc sulfate 220 (50 Zn) MG capsule Take 1 capsule (220 mg total) by mouth daily. 30 capsule 0   No current facility-administered medications for this visit.   Facility-Administered Medications Ordered in Other Visits  Medication Dose Route Frequency Provider Last Rate Last Admin   0.9 %  sodium chloride infusion   Intravenous Continuous Rickard Patience, MD 10 mL/hr at 12/13/22 0937 New Bag at 12/13/22 0937   CARBOplatin (PARAPLATIN) 750 mg in sodium chloride 0.9 % 250 mL chemo infusion  750 mg Intravenous Once Rickard Patience, MD       PACLitaxel (TAXOL) 360 mg in sodium chloride 0.9 % 500 mL chemo infusion (> 80mg /m2)  175 mg/m2 (Treatment Plan Adjusted) Intravenous Once Rickard Patience, MD        VITAL SIGNS: LMP 02/08/2013 (Within Months)  There were no vitals filed for this visit.  Estimated body mass index is 36.62 kg/m as calculated from the following:   Height as of 12/02/22: 5\' 9"  (1.753 m).   Weight as of an earlier encounter on 12/13/22: 248 lb (112.5 kg).  LABS: CBC:    Component Value Date/Time   WBC 7.1 12/13/2022 0808   WBC 11.0 (H) 11/18/2022 0919   HGB 9.7 (L) 12/13/2022 0808   HGB 12.3 08/07/2022 1210   HCT 30.4 (L) 12/13/2022 0808   HCT 38.6 08/07/2022 1210   PLT 391 12/13/2022 0808   PLT 320 08/07/2022 1210   MCV 88.6 12/13/2022 0808   MCV 90 08/07/2022 1210   MCV 90 08/21/2012 1523   NEUTROABS 4.1 12/13/2022 0808   NEUTROABS 5.5 08/07/2022 1210   NEUTROABS 2.2 06/05/2011 0550   LYMPHSABS 2.1 12/13/2022 0808   LYMPHSABS 2.4 08/07/2022 1210   LYMPHSABS 2.7 06/05/2011 0550   MONOABS 0.6 12/13/2022 0808   MONOABS 0.4 06/05/2011 0550   EOSABS 0.2 12/13/2022  1610   EOSABS 0.5 (H) 08/07/2022 1210   EOSABS 0.6 06/05/2011 0550   BASOSABS 0.1 12/13/2022 0808    BASOSABS 0.1 08/07/2022 1210   BASOSABS 0.1 06/05/2011 0550   Comprehensive Metabolic Panel:    Component Value Date/Time   NA 137 12/13/2022 0808   NA 132 (L) 08/07/2022 1210   NA 133 (L) 08/21/2012 1523   K 4.0 12/13/2022 0808   K 4.1 08/21/2012 1523   CL 101 12/13/2022 0808   CL 102 08/21/2012 1523   CO2 27 12/13/2022 0808   CO2 26 08/21/2012 1523   BUN 21 (H) 12/13/2022 0808   BUN 23 08/07/2022 1210   BUN 17 08/21/2012 1523   CREATININE 0.70 12/13/2022 0808   CREATININE 0.69 08/21/2012 1523   GLUCOSE 136 (H) 12/13/2022 0808   GLUCOSE 306 (H) 08/21/2012 1523   CALCIUM 9.2 12/13/2022 0808   CALCIUM 8.8 08/21/2012 1523   AST 15 12/13/2022 0808   ALT 15 12/13/2022 0808   ALT 25 08/21/2012 1523   ALKPHOS 45 12/13/2022 0808   ALKPHOS 73 08/21/2012 1523   BILITOT 0.2 12/13/2022 0808   PROT 7.5 12/13/2022 0808   PROT 7.0 08/07/2022 1210   PROT 7.1 08/21/2012 1523   ALBUMIN 3.3 (L) 12/13/2022 0808   ALBUMIN 4.0 08/07/2022 1210   ALBUMIN 3.4 08/21/2012 1523    RADIOGRAPHIC STUDIES: CT Chest W Contrast  Result Date: 11/20/2022 CLINICAL DATA:  Peritoneal carcinomatosis; staging; * Tracking Code: BO * EXAM: CT CHEST WITH CONTRAST TECHNIQUE: Multidetector CT imaging of the chest was performed during intravenous contrast administration. RADIATION DOSE REDUCTION: This exam was performed according to the departmental dose-optimization program which includes automated exposure control, adjustment of the mA and/or kV according to patient size and/or use of iterative reconstruction technique. CONTRAST:  75mL OMNIPAQUE IOHEXOL 300 MG/ML  SOLN COMPARISON:  CT abdomen and pelvis dated October 22, 2022 FINDINGS: Cardiovascular: Normal heart size. No pericardial effusion. Normal caliber thoracic aorta with mild atherosclerotic disease. Mitral annular calcifications. Severe coronary artery calcifications. Mediastinum/Nodes: Esophagus and thyroid are unremarkable. No enlarged axillary, mediastinal  or hilar lymph nodes. Prominent subcentimeter right retrocrural lymph nodes. Reference node measuring 7 mm in short axis on series 2, image 129. Lungs/Pleura: Central airways are patent. Ground-glass nodule of the superior portion of the right lower lobe measuring 7 mm on series 4, image 71. Small left pleural effusion. No pneumothorax. Upper Abdomen: Partially visualized large volume abdominal ascites and peritoneal thickening, volume ascites appears increased when compared with the prior CT. Musculoskeletal: No chest wall abnormality. No acute or significant osseous findings. IMPRESSION: 1. Prominent subcentimeter right retrocrural lymph nodes, metastatic disease can not be excluded. 2. Nonspecific ground-glass nodule of the superior portion of the right lower lobe measuring 7 mm. Recommend attention on follow-up. 3. Small left pleural effusion. 4. Partially visualized large volume abdominal ascites and peritoneal thickening, volume of ascites appears increased when compared with the prior CT. 5. Coronary artery calcifications and aortic Atherosclerosis (ICD10-I70.0). Electronically Signed   By: Allegra Lai M.D.   On: 11/20/2022 13:24   IR IMAGING GUIDED PORT INSERTION  Result Date: 11/18/2022 INDICATION: Chemotherapy access EXAM: Placement of chest port using ultrasound and fluoroscopic guidance MEDICATIONS: Documented in the EMR ANESTHESIA/SEDATION: Moderate (conscious) sedation was employed during this procedure. A total of Versed 2 mg and Fentanyl 125 mcg was administered intravenously. Moderate Sedation Time: 28 minutes. The patient's level of consciousness and vital signs were monitored continuously by radiology nursing throughout the procedure under  my direct supervision. FLUOROSCOPY TIME:  Fluoroscopy Time: 0.8 minutes (10 mGy) COMPLICATIONS: None immediate. PROCEDURE: Informed written consent was obtained from the patient after a thorough discussion of the procedural risks, benefits and  alternatives. All questions were addressed. Maximal Sterile Barrier Technique was utilized including caps, mask, sterile gowns, sterile gloves, sterile drape, hand hygiene and skin antiseptic. A timeout was performed prior to the initiation of the procedure. The patient was placed supine on the exam table. The right neck and chest was prepped and draped in the standard sterile fashion. A preliminary ultrasound of the right neck was performed and demonstrates a patent right internal jugular vein. A permanent ultrasound image was stored in the electronic medical record. The overlying skin was anesthetized with 1% Lidocaine. Using ultrasound guidance, access was obtained into the right internal jugular vein using a 21 gauge micropuncture set. A wire was advanced into the SVC, a short incision was made at the puncture site, and serial dilatation performed. Next, in an ipsilateral infraclavicular location, an incision was made at the site of the subcutaneous reservoir. Blunt dissection was used to open a pocket to contain the reservoir. A subcutaneous tunnel was then created from the port site to the puncture site. A(n) 8 Fr single lumen catheter was advanced through the tunnel. The catheter was attached to the port and this was placed in the subcutaneous pocket. Under fluoroscopic guidance, a peel away sheath was placed, and the catheter was trimmed to the appropriate length and was advanced into the central veins. The catheter length is 23 cm. The tip of the catheter lies near the superior cavoatrial junction. A kink was noted at the apex of the central venous catheter in the neck, which was able to be corrected with blunt manipulation. The port flushes and aspirates appropriately. The port was flushed and locked with heparinized saline. The port pocket was closed in 2 layers using 3-0 and 4-0 Vicryl/absorbable suture. Dermabond was also applied to both incisions. The patient tolerated the procedure well and was  transferred to recovery in stable condition. IMPRESSION: Successful placement of a right-sided chest port via the right internal jugular vein. The port catheter tip lies near the superior cavoatrial junction. The port is ready for immediate use Note: The final image documented in PACS demonstrates a kink at the apex of the catheter, which was corrected prior to completion of the procedure. Electronically Signed   By: Olive Bass M.D.   On: 11/18/2022 14:08   CT ABDOMINAL MASS BIOPSY  Result Date: 11/18/2022 INDICATION: Ascites with new omental caking, concern for metastatic disease EXAM: CT-guided core needle biopsy of the omentum MEDICATIONS: None. ANESTHESIA/SEDATION: Moderate (conscious) sedation was employed during this procedure. A total of Versed 1 mg and Fentanyl 50 mcg was administered intravenously. Moderate Sedation Time: 10 minutes. The patient's level of consciousness and vital signs were monitored continuously by radiology nursing throughout the procedure under my direct supervision. FLUOROSCOPY TIME:  N/a COMPLICATIONS: None immediate. PROCEDURE: Informed written consent was obtained from the patient after a thorough discussion of the procedural risks, benefits and alternatives. All questions were addressed. Maximal Sterile Barrier Technique was utilized including caps, mask, sterile gowns, sterile gloves, sterile drape, hand hygiene and skin antiseptic. A timeout was performed prior to the initiation of the procedure. The patient was placed supine on the exam table. Limited CT of the abdomen and pelvis was performed for planning purposes. This demonstrated omental thickening in the anterior mid abdomen. Skin entry site was marked, and  the overlying skin was prepped and draped in the standard sterile fashion. Local analgesia was obtained with 1% lidocaine. Using intermittent CT fluoroscopy, a 17 gauge introducer needle was advanced towards the identified lesion. Subsequently, core needle biopsy  was performed using an 18 gauge core biopsy device x4 total passes. Specimens were submitted in formalin to pathology for further handling. Limited postprocedure imaging demonstrated no complicating feature. The patient tolerated the procedure well, and was transferred to recovery in stable condition. IMPRESSION: Successful CT-guided core needle biopsy of abnormally thickened omentum. Electronically Signed   By: Olive Bass M.D.   On: 11/18/2022 14:03    PERFORMANCE STATUS (ECOG) : 2 - Symptomatic, <50% confined to bed  Review of Systems Unless otherwise noted, a complete review of systems is negative.  Physical Exam General: NAD Pulmonary: Unlabored Extremities: no edema, no joint deformities, L. BKA Skin: no rashes Neurological: Weakness but otherwise nonfocal  IMPRESSION: Follow-up visit.  Patient seen in infusion.  Patient reports he is doing well.  Denies any significant changes or concerns.  No symptomatic complaints at present.  Patient reports that her pain is overall improved on as needed Bentyl/hydromorphone.  She is taking hydromorphone generally once a day in the afternoon.  Tolerating it well.  Denies any adverse effects.  Patient says that her appetite remains poor.  She continues to lose weight.  Patient is followed by dietitian.  Recommend oral nutritional supplements 2-3 times daily along with maximizing high-calorie/high-protein foods.  Patient has met with [facility].  Appreciate their input.  I sent patient home with a MOST form today to review with family.  PLAN: -Continue current scope of treatment -Appreciate [facility] involvement -Continue hydromorphone/Bentyl -Daily bowel regimen -MOST Form reviewed -Follow-up telephone visit 1 month   Patient expressed understanding and was in agreement with this plan. She also understands that She can call the clinic at any time with any questions, concerns, or complaints.     Time Total: 15 minutes  Visit  consisted of counseling and education dealing with the complex and emotionally intense issues of symptom management and palliative care in the setting of serious and potentially life-threatening illness.Greater than 50%  of this time was spent counseling and coordinating care related to the above assessment and plan.  Signed by: Laurette Schimke, PhD, NP-C

## 2022-12-13 NOTE — Assessment & Plan Note (Signed)
Could be due to chemotherapy, however she may also have underlying conditions.  She was advised to consult an ophthalmologist for further evaluation.

## 2022-12-13 NOTE — Assessment & Plan Note (Signed)
We discussed about instructions of antiemetics.  She can use Compazine as instructed if Zofran only partially relieves her symptoms.

## 2022-12-13 NOTE — Progress Notes (Signed)
[facility] CSW Counseling Note  Patient was referred by medical provider. Treatment type: Individual  Presenting Concerns: Patient and/or family reports the following symptoms/concerns: anxiety Duration of problem: several  years; Severity of problem: moderate   Orientation:oriented to person, place, time/date, situation, day of week, month of year, and year.   Affect: Appropriate Risk of harm to self or others: No plan to harm self or others  Patient and/or Family's Strengths/Protective Factors: Concrete supports in place (healthy food, safe environments, etc.)Ability for insight  Active sense of humor  Average or above average intelligence  Communication skills  General fund of knowledge  Motivation for treatment/growth  Supportive family/friends      Goals Addressed: Patient will:  Reduce symptoms of: anxiety Increase knowledge and/or ability of: self-management skills  Increase healthy adjustment to current life circumstances   Progress towards Goals: Initial   Interventions: Interventions utilized:  Motivational Interviewing, Strength-based, and Supportive      Assessment: Patient currently experiencing increased anxiety.  She stated she has had 3-4 panic attacks in the past few weeks triggered by financial strain.  She is using breathing techniques to relieve the symptoms which appear to be effective.  She reported speaking with the staff at [facility] has also helped relieve her symptoms.  Her wife, [name], was present during a portion of the meeting and appeared very supportive.      Plan: Follow up with CSW: Her next infusion date. Behavioral recommendations: Continue using breathing technique. Referral(s): None at this time.  [facility] is also caring for patient.       Pascal Lux Meeyah Ovitt, LCSW

## 2022-12-13 NOTE — Progress Notes (Signed)
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  Patient receiving carboplatin, paclitaxel. Palliative Care adjusting medications for pain.   Met with patient and spouse during infusion.  Reports decreased appetite.  Has been trying to eat small frequent snacks.  Ate small amount of chicken and dumplings, ham and cheese sandwich, applesauce. She is drinking some of ensure clear shakes. Does not really like the milky shakes.    Reports increased anxiety/depression.  Meeting with [facility].     Medications: Bentyl added for spasmotic GI pain, hydromorphine  Labs: glucose 136, BUN 21, creatinine 0.70  Anthropometrics:   Weight 248 lb today (? Accurate) 269 lb 3.2 oz on 11/14 259 lb on 11/5 274 lb on 09/18/22   NUTRITION DIAGNOSIS: Inadequate oral intake ongoing    INTERVENTION:  Medications being given to address abdominal pain.  Hopefully this will improve appetite. Continue oral nutrition supplements for added nutrition (ensure clear-need to monitor blood glucose) Discussed adding yogurt for calories and protein Encouraged continued small frequent mini meals/snacks    MONITORING, EVALUATION, GOAL: weight trends, intake   NEXT VISIT: Friday, Jan 17 during infusion  Jandel Patriarca B. Freida Busman, RD, LDN Registered Dietitian [phone]

## 2022-12-13 NOTE — Assessment & Plan Note (Signed)
Follow up with nutritionist.  

## 2022-12-13 NOTE — Progress Notes (Signed)
Hematology/Oncology Progress note [phone] [phone]      CHIEF COMPLAINTS  Primary peritoneal carcinomatosis  ASSESSMENT & PLAN:   Primary peritoneal carcinomatosis (HCC) Malignant ascites. CA125 is significantly elevated in 700s.  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  Obtain HRD testing. Case was discussed on gynonc TB on 11/13. I will not add Bevacizumab neoadjuvantly given her recent surgery, Labs are reviewed and discussed with patient. Proceed with cycle 2 carboplatin and taxol.  She will see covering MD for cycle 3 and will obtain CT chest abdomen pelvis on 1/6  Khorana score 3  Recommend to start Eliquis 2.5mg  BID, continue Aspirin 81mg  daily.  Genetic evaluation.   Encounter for antineoplastic chemotherapy Chemotherapy treatment as planned.   IDA (iron deficiency anemia) Lab Results  Component Value Date   HGB 9.7 (L) 12/13/2022   TIBC 195 (L) 09/19/2022   IRONPCTSAT 7 (L) 09/19/2022   FERRITIN 314 (H) 09/19/2022    continue oral iron supplementation.   Neoplasm related pain She has tried both tramadol and oxycodone, which did not help her pain.  Follow up with palliative care service for further management. - now on hydromorphone 2 mg every 4 hours as needed   Protein calorie malnutrition (HCC) Follow up with nutritionist.   Blurring of vision Could be due to chemotherapy, however she may also have underlying conditions.  She was advised to consult an ophthalmologist for further evaluation.  Chemotherapy-induced nausea We discussed about instructions of antiemetics.  She can use Compazine as instructed if Zofran only partially relieves her symptoms.    Follow up 3 weeks lab covering MD chemo.  Repeat CT after cycle 3.  6 weeks lab MD chemo.   All questions were answered. The patient knows to call the clinic with any problems,  questions or concerns.  Rickard Patience, MD, PhD [facility] Hematology Oncology 12/13/2022    HISTORY OF PRESENTING ILLNESS:  Nancy [name] 57 y.o. female presents to establish care for  peritoneal carcinomatosis I have reviewed her chart and materials related to her cancer extensively and collaborated history with the patient. Summary of oncologic history is as follows: Oncology History  Primary peritoneal carcinomatosis (HCC)  11/02/2022 Imaging   Large volume ascites in the abdomen or pelvis with omental caking.Appearance is concerning for peritoneal malignancy/carcinomatosis,often seen with ovarian cancer, but no ovarian mass visualized. Aortic atherosclerosis    11/06/2022 Initial Diagnosis   Primary peritoneal carcinomatosis   -presented to emergency room for evaluation of abdominal pain/bloating, poor oral intake, nonbloody nonbilious vomiting  Patient underwent paracentesis, cytology showed malignant cells. IHC can not be added.    11/14/2022 Imaging   CT chest w contrast  1. Prominent subcentimeter right retrocrural lymph nodes, metastatic disease can not be excluded. 2. Nonspecific ground-glass nodule of the superior portion of the right lower lobe measuring 7 mm. Recommend attention on follow-up. 3. Small left pleural effusion. 4. Partially visualized large volume abdominal ascites and peritoneal thickening, volume of ascites appears increased when compared with the prior CT. 5. Coronary artery calcifications and aortic Atherosclerosis (ICD10-I70.0).   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 The report date is 11/25/2022.  The CancerNext-Expanded +  RNAinsight gene panel offered by [employer] and includes sequencing and rearrangement  analysis for the following 71 genes: AIP, ALK, APC*, ATM*, AXIN2, BAP1, BARD1, BMPR1A, BRCA1*, BRCA2*, BRIP1*, CDC73, CDH1*,CDK4, CDKN1B, CDKN2A, CHEK2*, CTNNA1, DICER1, FH, FLCN, KIF1B, LZTR1, MAX, MEN1, MET, MLH1*, MSH2*, MSH3, MSH6*, MUTYH*, NF1*, NF2, NTHL1, PALB2*, PHOX2B, PMS2*, POT1, PRKAR1A, PTCH1, PTEN*, RAD51C*, RAD51D*,RB1, RET, SDHA, SDHAF2, SDHB, SDHC, SDHD, SMAD4, SMARCA4, SMARCB1, SMARCE1, STK11, SUFU, TMEM127, TP53*,TSC1, TSC2, VHL; EGFR, EGLN1, HOXB13, KIT, MITF, PDGFRA, POLD1 and POLE (sequencing only); EPCAM and GREM1 (deletion/duplication only).    History of sepsis secondary to diabetic foot infection/osteomyelitis status post left BKA done on 09/24/2022,    Today she presents to for evaluation prior to chemotherapy. Accompanied by significant other.  +concerns about blurry vision, particularly in the left eye, where she also notices a spot.   The patient also reports daily episodes of nausea and pain, which occur predictably between 4:30 and 5:00 PM. Ondansetran partially relieves her symptoms.  + panic attacks, she takes Xanax PRN. She also will establish care with [facility].   The patient has been experiencing weight loss, which she attributes to both the cancer and the chemotherapy. She reports a decrease in food intake and also mentions fluid build-up in the abdomen (ascites), which has been decreasing.Marland Kitchen     MEDICAL HISTORY:  Past Medical History:  Diagnosis Date   Abnormal cardiovascular stress test 09/21/2018   Formatting of this note might be different from the original. Lexiscan Myoview 09/16/2018 revealed mild anterior ischemia   Adverse effect of motion 05/10/2014   Anxiety    Arthritis    r knee   Asthma    Breast cyst 05/10/2014   CAD (coronary artery disease)    a.) LHC 06/04/2009 at [facility]; non-obstructive CAD. b.) CTA with FFR 10/08/2018: extensive mixed plaque proximal to mid LAD (51-69%); Coronary Ca score 217; FFR 0.71 dPDA, 0.86 mLAD, 0.87 dLCx.   [facility]  (congestive cardiac failure) (HCC) 06/06/2008   a.) 30% EF. b.) TTE 06/03/2011: EF >55%; triv MR. c.) TTE 04/27/2018: EF 55%; mild LVH; triv PR, mild MR/TR; G1DD.   Chest pain with high risk for cardiac etiology 07/02/2016   Chronic use of opiate drug for therapeutic purpose 02/13/2022   Formatting of this note might be different from the original. [facility] Pain Contract signed on 04/18/16 & updated 03/04/17; UDS done on 04/18/16.   Complication of anesthesia    Diabetic foot infection (HCC) 03/13/2015   Diabetic foot ulcer (HCC) 02/08/2021   Eczema    Family history of adverse reaction to anesthesia    a.) PONV in mother and grandmother   GERD (gastroesophageal reflux disease) 05/10/2014   History of kidney stones    HLD (hyperlipidemia)    Hyponatremia 03/13/2015   Migraines    Motion sickness    Other acute osteomyelitis, left ankle and foot (HCC) 02/13/2022   Panic attacks    Peripheral edema 04/27/2018   Pneumonia    PONV (postoperative nausea and vomiting)    Sepsis secondary to diabetic foot infection 03/13/2015   T2DM (type 2 diabetes mellitus) (HCC)    Takotsubo cardiomyopathy / transient apical balooning syndrome / stress-induced cardiomyopathy 05/10/2014   Unspecified essential hypertension     SURGICAL HISTORY: Past Surgical History:  Procedure Laterality Date   AMPUTATION Left 09/19/2022   Procedure: AMPUTATION BELOW KNEE;  Surgeon: Renford Dills, MD;  Location: [facility];  Service: Vascular;  Laterality: Left;   AMPUTATION Left 09/24/2022  Procedure: AMPUTATION BELOW KNEE WITH WOUND CLOSURE;  Surgeon: Renford Dills, MD;  Location: [facility];  Service: Vascular;  Laterality: Left;   AMPUTATION TOE Left 03/16/2015   Procedure: left fifth toe amputation with incision and drainage;  Surgeon: Gwyneth Revels, DPM;  Location: [facility];  Service: Podiatry;  Laterality: Left;   APPLICATION OF WOUND VAC Left 02/14/2021   Procedure: APPLICATION OF WOUND VAC;  Surgeon: Gwyneth Revels, DPM;  Location: [facility];  Service: Podiatry;  Laterality: Left;   APPLICATION OF WOUND VAC Left 04/16/2021   Procedure: APPLICATION OF WOUND VAC;  Surgeon: Peggye Form, DO;  Location: [facility];  Service: Plastics;  Laterality: Left;   CARDIAC CATHETERIZATION Left 06/04/2009   Procedure: CARDIAC CATHETERIZATION; Location: [facility]   CHOLECYSTECTOMY     DEBRIDEMENT AND CLOSURE WOUND Left 04/16/2021   Procedure: DEBRIDEMENT AND CLOSURE WOUND;  Surgeon: Peggye Form, DO;  Location: [facility];  Service: Plastics;  Laterality: Left;  1 hour   IR IMAGING GUIDED PORT INSERTION  11/18/2022   IRRIGATION AND DEBRIDEMENT FOOT Left 02/08/2021   Procedure: IRRIGATION AND DEBRIDEMENT FOOT - LFT HEEL ULCER;  Surgeon: Gwyneth Revels, DPM;  Location: [facility];  Service: Podiatry;  Laterality: Left;   IRRIGATION AND DEBRIDEMENT FOOT Left 02/14/2021   Procedure: IRRIGATION AND DEBRIDEMENT LEFT HEEL;  Surgeon: Gwyneth Revels, DPM;  Location: [facility];  Service: Podiatry;  Laterality: Left;   kidney stone removal     KNEE ARTHROSCOPY W/ MENISCAL REPAIR     NOSE SURGERY  01/07/1989   due to fracture   TEE WITHOUT CARDIOVERSION N/A 09/26/2022   Procedure: TRANSESOPHAGEAL ECHOCARDIOGRAM;  Surgeon: Clotilde Dieter, DO;  Location: [facility];  Service: Cardiovascular;  Laterality: N/A;   TOE AMPUTATION     second toe   TONSILLECTOMY     TOTAL KNEE ARTHROPLASTY  01/11/2011   Procedure: TOTAL KNEE ARTHROPLASTY;  Surgeon: Harvie Junior;  Location: [facility];  Service: Orthopedics;  Laterality: Right;  COMPUTER ASSISTED TOTAL KNEE REPLACEMENT    SOCIAL HISTORY: Social History   Socioeconomic History   Marital status: Married    Spouse name: Not on file   Number of children: Not on file   Years of education: Not on file   Highest education level: Not on file  Occupational History   Not on file  Tobacco Use   Smoking status: Former    Current  packs/day: 0.00    Types: Cigarettes    Quit date: 03/12/1985    Years since quitting: 37.7   Smokeless tobacco: Never  Vaping Use   Vaping status: Never Used  Substance and Sexual Activity   Alcohol use: No   Drug use: No   Sexual activity: Yes    Birth control/protection: Post-menopausal  Other Topics Concern   Not on file  Social History Narrative   Wife, [name], at bedside. One indoor pet, dog.   Social Determinants of Health   Financial Resource Strain: Not on file  Food Insecurity: No Food Insecurity (10/22/2022)   Hunger Vital Sign    Worried About Running Out of Food in the Last Year: Never true    Ran Out of Food in the Last Year: Never true  Transportation Needs: No Transportation Needs (10/22/2022)   PRAPARE - Administrator, Civil Service (Medical): No    Lack of Transportation (Non-Medical): No  Physical Activity: Not on file  Stress: Not on file  Social Connections:  Not on file  Intimate Partner Violence: Not At Risk (10/22/2022)   Humiliation, Afraid, Rape, and Kick questionnaire    Fear of Current or Ex-Partner: No    Emotionally Abused: No    Physically Abused: No    Sexually Abused: No    FAMILY HISTORY: Family History  Problem Relation Age of Onset   Transient ischemic attack Mother    Asthma Mother    Hypertension Mother    Lung cancer Father 59   Hypertension Father    Asthma Sister    Anxiety disorder Sister    Asthma Sister    Hypertension Brother    Ovarian cancer Maternal Aunt 43   Melanoma Maternal Uncle        dx 9s   Lung cancer Paternal Aunt 37   Stomach cancer Maternal Grandmother    Colon cancer Maternal Grandmother    Lymphoma Maternal Grandmother    Melanoma Maternal Grandfather 72       sun exposure   Lung cancer Paternal Grandmother 34   Kidney cancer Paternal Grandfather        dx early   Crohn's disease Other    GER disease Other    Breast cancer Neg Hx     ALLERGIES:  is allergic to shellfish allergy,  codeine, morphine and codeine, rosuvastatin, percocet [oxycodone-acetaminophen], sulfa antibiotics, and theophyllines.  MEDICATIONS:  Current Outpatient Medications  Medication Sig Dispense Refill   acidophilus (RISAQUAD) CAPS capsule Take 1 capsule by mouth daily.     albuterol (PROVENTIL HFA;VENTOLIN HFA) 108 (90 Base) MCG/ACT inhaler Inhale 2 puffs into the lungs every 4 (four) hours as needed for wheezing or shortness of breath. Reported on 03/29/2015 1 Inhaler 3   albuterol (PROVENTIL) (2.5 MG/3ML) 0.083% nebulizer solution Take 2.5 mg by nebulization as needed.     ALPRAZolam (XANAX) 0.5 MG tablet TAKE 1/2 TO 1 TABLET BY MOUTH AS NEEDED FOR PANIC ATTACKS 30 tablet 3   apixaban (ELIQUIS) 2.5 MG TABS tablet Take 1 tablet (2.5 mg total) by mouth 2 (two) times daily. 60 tablet 2   ascorbic acid (VITAMIN C) 500 MG tablet Take 1 tablet (500 mg total) by mouth 2 (two) times daily. 60 tablet 0   aspirin EC 81 MG tablet Take 81 mg by mouth daily.     carvedilol (COREG) 6.25 MG tablet TAKE 1 TABLET BY MOUTH TWICE A DAY 180 tablet 1   cholecalciferol (VITAMIN D3) 25 MCG (1000 UNIT) tablet Take 1,000 Units by mouth daily.     Continuous Glucose Sensor (FREESTYLE LIBRE 2 SENSOR) MISC 1 each by Does not apply route every 14 (fourteen) days. 2 each 3   Cyanocobalamin (VITAMIN B 12) 500 MCG TABS Take by mouth daily at 8 pm.     desvenlafaxine (PRISTIQ) 100 MG 24 hr tablet TAKE 1 TABLET BY MOUTH EVERY DAY 90 tablet 2   dexamethasone (DECADRON) 4 MG tablet Take 2 tablets (8mg ) by mouth daily starting the day after carboplatin for 2 days. Take with food 30 tablet 1   dicyclomine (BENTYL) 10 MG capsule Take 1 capsule (10 mg total) by mouth 4 (four) times daily -  before meals and at bedtime. 60 capsule 1   Ensure Max Protein (ENSURE MAX PROTEIN) LIQD Take 330 mLs (11 oz total) by mouth 2 (two) times daily.     fenofibrate 54 MG tablet TAKE 1 TABLET BY MOUTH EVERY DAY 90 tablet 3   fluconazole (DIFLUCAN) 200 MG  tablet Take 1 tablet (200  mg total) by mouth daily. 30 tablet 3   gabapentin (NEURONTIN) 300 MG capsule Take 1 capsule (300 mg total) by mouth at bedtime as needed. 30 capsule 3   HYDROmorphone (DILAUDID) 2 MG tablet Take 1 tablet (2 mg total) by mouth every 4 (four) hours as needed for severe pain (pain score 7-10). 60 tablet 0   ibuprofen (ADVIL) 800 MG tablet TAKE 1 TABLET BY MOUTH EVERY 8 HOURS AS NEEDED 90 tablet 3   insulin NPH-regular Human (70-30) 100 UNIT/ML injection Inject 15 Units into the skin in the morning and at bedtime.     iron polysaccharides (NIFEREX) 150 MG capsule Take 1 capsule (150 mg total) by mouth daily. 30 capsule 2   lidocaine-prilocaine (EMLA) cream Apply to affected area once 30 g 3   lisinopril (ZESTRIL) 5 MG tablet Take 5 mg by mouth 2 (two) times daily.     metFORMIN (GLUCOPHAGE) 500 MG tablet TAKE TWO TABLETS EACH MORNING. AND 3 TABLETS IN THE EVENING AS DIRECTED 150 tablet 5   methocarbamol (ROBAXIN) 500 MG tablet Take 1 tablet (500 mg total) by mouth every 8 (eight) hours as needed for muscle spasms. 30 tablet 0   montelukast (SINGULAIR) 10 MG tablet TAKE 1 TABLET BY MOUTH EVERYDAY AT BEDTIME 90 tablet 1   Multiple Vitamin (MULTIVITAMIN WITH MINERALS) TABS tablet Take 1 tablet by mouth daily. 30 tablet 2   naloxone (NARCAN) nasal spray 4 mg/0.1 mL SPRAY 1 SPRAY INTO ONE NOSTRIL AS DIRECTED FOR OPIOID OVERDOSE (TURN PERSON ON SIDE AFTER DOSE. IF NO RESPONSE IN 2-3 MINUTES OR PERSON RESPONDS BUT RELAPSES, REPEAT USING A NEW SPRAY DEVICE AND SPRAY INTO THE OTHER NOSTRIL. CALL 911 AFTER USE.) * EMERGENCY USE ONLY * 1 each 0   nystatin (MYCOSTATIN/NYSTOP) powder APPLY TOPICALLY TWICE DAILY AS NEEDED 30 g 3   nystatin ointment (MYCOSTATIN) Apply 1 Application topically 2 (two) times daily. 30 g 0   ondansetron (ZOFRAN) 8 MG tablet Take 1 tablet (8 mg total) by mouth every 8 (eight) hours as needed for nausea or vomiting. Start on the third day after carboplatin. 30  tablet 1   ondansetron (ZOFRAN-ODT) 8 MG disintegrating tablet Take 1 tablet (8 mg total) by mouth every 8 (eight) hours as needed for nausea or vomiting. 90 tablet 3   pantoprazole (PROTONIX) 40 MG tablet TAKE 1 TABLET BY MOUTH EVERY DAY 90 tablet 3   pravastatin (PRAVACHOL) 40 MG tablet TAKE 1 TABLET BY MOUTH EVERY DAY 30 tablet 5   prochlorperazine (COMPAZINE) 10 MG tablet Take 1 tablet (10 mg total) by mouth every 6 (six) hours as needed for nausea or vomiting. 30 tablet 1   zinc sulfate 220 (50 Zn) MG capsule Take 1 capsule (220 mg total) by mouth daily. 30 capsule 0   No current facility-administered medications for this visit.   Facility-Administered Medications Ordered in Other Visits  Medication Dose Route Frequency Provider Last Rate Last Admin   0.9 %  sodium chloride infusion   Intravenous Continuous Rickard Patience, MD 5 mL/hr at 12/13/22 1038 Infusion Verify at 12/13/22 1038   CARBOplatin (PARAPLATIN) 750 mg in sodium chloride 0.9 % 250 mL chemo infusion  750 mg Intravenous Once Rickard Patience, MD       PACLitaxel (TAXOL) 360 mg in sodium chloride 0.9 % 500 mL chemo infusion (> 80mg /m2)  175 mg/m2 (Treatment Plan Adjusted) Intravenous Once Rickard Patience, MD 187 mL/hr at 12/13/22 1047 360 mg at 12/13/22 1047    Review  of Systems  Constitutional:  Positive for appetite change and unexpected weight change. Negative for chills, fatigue and fever.  HENT:   Negative for hearing loss and voice change.   Eyes:  Negative for eye problems.  Respiratory:  Negative for chest tightness, cough and shortness of breath.   Cardiovascular:  Negative for chest pain and leg swelling.  Gastrointestinal:  Positive for abdominal distention and abdominal pain. Negative for blood in stool.  Endocrine: Negative for hot flashes.  Genitourinary:  Negative for difficulty urinating and frequency.   Musculoskeletal:  Negative for arthralgias.       S/p left  BKA   Skin:  Negative for itching and rash.  Neurological:   Negative for extremity weakness.  Hematological:  Negative for adenopathy.  Psychiatric/Behavioral:  Negative for confusion.      PHYSICAL EXAMINATION: ECOG PERFORMANCE STATUS: 1 - Symptomatic but completely ambulatory  Vitals:   12/13/22 0836 12/13/22 0853  BP: (!) 214/78 (!) 187/69  Pulse: 75   Resp: 18   Temp: 97.8 F (36.6 C)   SpO2: 98%    Filed Weights   12/13/22 0836  Weight: 248 lb (112.5 kg)    Physical Exam Constitutional:      General: She is not in acute distress.    Appearance: She is obese. She is not diaphoretic.  HENT:     Head: Normocephalic and atraumatic.     Mouth/Throat:     Pharynx: No oropharyngeal exudate.  Eyes:     General: No scleral icterus. Cardiovascular:     Rate and Rhythm: Normal rate and regular rhythm.  Pulmonary:     Effort: Pulmonary effort is normal. No respiratory distress.     Breath sounds: Normal breath sounds. No wheezing.  Abdominal:     General: There is no distension.     Palpations: Abdomen is soft. There is mass.     Tenderness: There is no abdominal tenderness.  Musculoskeletal:        General: Normal range of motion.     Cervical back: Normal range of motion and neck supple.     Comments: S/p left BKA  Skin:    General: Skin is warm and dry.     Findings: No erythema.  Neurological:     Mental Status: She is alert and oriented to person, place, and time. Mental status is at baseline.     Cranial Nerves: No cranial nerve deficit.     Motor: No abnormal muscle tone.  Psychiatric:        Mood and Affect: Affect normal.     Comments: anxious      LABORATORY DATA:  I have reviewed the data as listed    Latest Ref Rng & Units 12/13/2022    8:08 AM 11/28/2022   12:59 PM 11/21/2022    8:09 AM  CBC  WBC 4.0 - 10.5 K/uL 7.1  5.3  9.9   Hemoglobin 12.0 - 15.0 g/dL 9.7  16.1  09.6   Hematocrit 36.0 - 46.0 % 30.4  31.8  34.7   Platelets 150 - 400 K/uL 391  347  431       Latest Ref Rng & Units 12/13/2022     8:08 AM 11/28/2022   12:59 PM 11/21/2022    8:09 AM  CMP  Glucose 70 - 99 mg/dL 045  409  811   BUN 6 - 20 mg/dL 21  25  25    Creatinine 0.44 - 1.00 mg/dL 9.14  7.82  1.01   Sodium 135 - 145 mmol/L 137  138  136   Potassium 3.5 - 5.1 mmol/L 4.0  4.7  4.6   Chloride 98 - 111 mmol/L 101  101  103   CO2 22 - 32 mmol/L 27  26  24    Calcium 8.9 - 10.3 mg/dL 9.2  9.1  8.8   Total Protein 6.5 - 8.1 g/dL 7.5  7.2  7.4   Total Bilirubin <1.2 mg/dL 0.2  0.4  0.2   Alkaline Phos 38 - 126 U/L 45  40  43   AST 15 - 41 U/L 15  22  16    ALT 0 - 44 U/L 15  14  12       RADIOGRAPHIC STUDIES: I have personally reviewed the radiological images as listed and agreed with the findings in the report. CT Chest W Contrast  Result Date: 11/20/2022 CLINICAL DATA:  Peritoneal carcinomatosis; staging; * Tracking Code: BO * EXAM: CT CHEST WITH CONTRAST TECHNIQUE: Multidetector CT imaging of the chest was performed during intravenous contrast administration. RADIATION DOSE REDUCTION: This exam was performed according to the departmental dose-optimization program which includes automated exposure control, adjustment of the mA and/or kV according to patient size and/or use of iterative reconstruction technique. CONTRAST:  75mL OMNIPAQUE IOHEXOL 300 MG/ML  SOLN COMPARISON:  CT abdomen and pelvis dated October 22, 2022 FINDINGS: Cardiovascular: Normal heart size. No pericardial effusion. Normal caliber thoracic aorta with mild atherosclerotic disease. Mitral annular calcifications. Severe coronary artery calcifications. Mediastinum/Nodes: Esophagus and thyroid are unremarkable. No enlarged axillary, mediastinal or hilar lymph nodes. Prominent subcentimeter right retrocrural lymph nodes. Reference node measuring 7 mm in short axis on series 2, image 129. Lungs/Pleura: Central airways are patent. Ground-glass nodule of the superior portion of the right lower lobe measuring 7 mm on series 4, image 71. Small left pleural effusion.  No pneumothorax. Upper Abdomen: Partially visualized large volume abdominal ascites and peritoneal thickening, volume ascites appears increased when compared with the prior CT. Musculoskeletal: No chest wall abnormality. No acute or significant osseous findings. IMPRESSION: 1. Prominent subcentimeter right retrocrural lymph nodes, metastatic disease can not be excluded. 2. Nonspecific ground-glass nodule of the superior portion of the right lower lobe measuring 7 mm. Recommend attention on follow-up. 3. Small left pleural effusion. 4. Partially visualized large volume abdominal ascites and peritoneal thickening, volume of ascites appears increased when compared with the prior CT. 5. Coronary artery calcifications and aortic Atherosclerosis (ICD10-I70.0). Electronically Signed   By: Allegra Lai M.D.   On: 11/20/2022 13:24   IR IMAGING GUIDED PORT INSERTION  Result Date: 11/18/2022 INDICATION: Chemotherapy access EXAM: Placement of chest port using ultrasound and fluoroscopic guidance MEDICATIONS: Documented in the EMR ANESTHESIA/SEDATION: Moderate (conscious) sedation was employed during this procedure. A total of Versed 2 mg and Fentanyl 125 mcg was administered intravenously. Moderate Sedation Time: 28 minutes. The patient's level of consciousness and vital signs were monitored continuously by radiology nursing throughout the procedure under my direct supervision. FLUOROSCOPY TIME:  Fluoroscopy Time: 0.8 minutes (10 mGy) COMPLICATIONS: None immediate. PROCEDURE: Informed written consent was obtained from the patient after a thorough discussion of the procedural risks, benefits and alternatives. All questions were addressed. Maximal Sterile Barrier Technique was utilized including caps, mask, sterile gowns, sterile gloves, sterile drape, hand hygiene and skin antiseptic. A timeout was performed prior to the initiation of the procedure. The patient was placed supine on the exam table. The right neck and  chest was prepped and draped  in the standard sterile fashion. A preliminary ultrasound of the right neck was performed and demonstrates a patent right internal jugular vein. A permanent ultrasound image was stored in the electronic medical record. The overlying skin was anesthetized with 1% Lidocaine. Using ultrasound guidance, access was obtained into the right internal jugular vein using a 21 gauge micropuncture set. A wire was advanced into the SVC, a short incision was made at the puncture site, and serial dilatation performed. Next, in an ipsilateral infraclavicular location, an incision was made at the site of the subcutaneous reservoir. Blunt dissection was used to open a pocket to contain the reservoir. A subcutaneous tunnel was then created from the port site to the puncture site. A(n) 8 Fr single lumen catheter was advanced through the tunnel. The catheter was attached to the port and this was placed in the subcutaneous pocket. Under fluoroscopic guidance, a peel away sheath was placed, and the catheter was trimmed to the appropriate length and was advanced into the central veins. The catheter length is 23 cm. The tip of the catheter lies near the superior cavoatrial junction. A kink was noted at the apex of the central venous catheter in the neck, which was able to be corrected with blunt manipulation. The port flushes and aspirates appropriately. The port was flushed and locked with heparinized saline. The port pocket was closed in 2 layers using 3-0 and 4-0 Vicryl/absorbable suture. Dermabond was also applied to both incisions. The patient tolerated the procedure well and was transferred to recovery in stable condition. IMPRESSION: Successful placement of a right-sided chest port via the right internal jugular vein. The port catheter tip lies near the superior cavoatrial junction. The port is ready for immediate use Note: The final image documented in PACS demonstrates a kink at the apex of the  catheter, which was corrected prior to completion of the procedure. Electronically Signed   By: Olive Bass M.D.   On: 11/18/2022 14:08   CT ABDOMINAL MASS BIOPSY  Result Date: 11/18/2022 INDICATION: Ascites with new omental caking, concern for metastatic disease EXAM: CT-guided core needle biopsy of the omentum MEDICATIONS: None. ANESTHESIA/SEDATION: Moderate (conscious) sedation was employed during this procedure. A total of Versed 1 mg and Fentanyl 50 mcg was administered intravenously. Moderate Sedation Time: 10 minutes. The patient's level of consciousness and vital signs were monitored continuously by radiology nursing throughout the procedure under my direct supervision. FLUOROSCOPY TIME:  N/a COMPLICATIONS: None immediate. PROCEDURE: Informed written consent was obtained from the patient after a thorough discussion of the procedural risks, benefits and alternatives. All questions were addressed. Maximal Sterile Barrier Technique was utilized including caps, mask, sterile gowns, sterile gloves, sterile drape, hand hygiene and skin antiseptic. A timeout was performed prior to the initiation of the procedure. The patient was placed supine on the exam table. Limited CT of the abdomen and pelvis was performed for planning purposes. This demonstrated omental thickening in the anterior mid abdomen. Skin entry site was marked, and the overlying skin was prepped and draped in the standard sterile fashion. Local analgesia was obtained with 1% lidocaine. Using intermittent CT fluoroscopy, a 17 gauge introducer needle was advanced towards the identified lesion. Subsequently, core needle biopsy was performed using an 18 gauge core biopsy device x4 total passes. Specimens were submitted in formalin to pathology for further handling. Limited postprocedure imaging demonstrated no complicating feature. The patient tolerated the procedure well, and was transferred to recovery in stable condition. IMPRESSION:  Successful CT-guided core needle biopsy of  abnormally thickened omentum. Electronically Signed   By: Olive Bass M.D.   On: 11/18/2022 14:03

## 2022-12-13 NOTE — Assessment & Plan Note (Signed)
Chemotherapy treatment as planned. 

## 2022-12-13 NOTE — Assessment & Plan Note (Addendum)
Malignant ascites. CA125 is significantly elevated in 700s.  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  Obtain HRD testing. Case was discussed on gynonc TB on 11/13. I will not add Bevacizumab neoadjuvantly given her recent surgery, Labs are reviewed and discussed with patient. Proceed with cycle 2 carboplatin and taxol.  She will see covering MD for cycle 3 and will obtain CT chest abdomen pelvis on 1/6  Khorana score 3  Recommend to start Eliquis 2.5mg  BID, continue Aspirin 81mg  daily.  Genetic evaluation.

## 2022-12-13 NOTE — Assessment & Plan Note (Signed)
Lab Results  Component Value Date   HGB 9.7 (L) 12/13/2022   TIBC 195 (L) 09/19/2022   IRONPCTSAT 7 (L) 09/19/2022   FERRITIN 314 (H) 09/19/2022    continue oral iron supplementation.

## 2022-12-13 NOTE — Patient Instructions (Signed)

## 2022-12-14 LAB — CA 125: Cancer Antigen (CA) 125: 332 U/mL — ABNORMAL HIGH (ref 0.0–38.1)

## 2022-12-22 ENCOUNTER — Encounter: Payer: Self-pay | Admitting: Family

## 2022-12-22 NOTE — Progress Notes (Signed)
Established Patient Office Visit  Subjective:  Patient ID: Nancy [name], female    DOB: 10-Jun-1965  Age: 57 y.o. MRN: [id number]  Chief Complaint  Patient presents with   Hospitalization Follow-up    Hospital follow up    Patient is here today for hospital follow up.  She ended up having to have a left BKA , though she is in good spirits today.  After she was discharged, she ended up back in the hospital, as she had a lot of swelling in her abdomen. We are awaiting pathology results, as she had a lot of pain with this as well. She has an appointment with oncology to discuss next steps.   She feels well in general, has no major new concerns today.    No other concerns at this time.   Past Medical History:  Diagnosis Date   Abnormal cardiovascular stress test 09/21/2018   Formatting of this note might be different from the original. Lexiscan Myoview 09/16/2018 revealed mild anterior ischemia   Adverse effect of motion 05/10/2014   AKI (acute kidney injury) (HCC) 10/22/2022   Anxiety    Arthritis    r knee   Asthma    Breast cyst 05/10/2014   CAD (coronary artery disease)    a.) LHC 06/04/2009 at [facility]; non-obstructive CAD. b.) CTA with FFR 10/08/2018: extensive mixed plaque proximal to mid LAD (51-69%); Coronary Ca score 217; FFR 0.71 dPDA, 0.86 mLAD, 0.87 dLCx.   CCF (congestive cardiac failure) (HCC) 06/06/2008   a.) 30% EF. b.) TTE 06/03/2011: EF >55%; triv MR. c.) TTE 04/27/2018: EF 55%; mild LVH; triv PR, mild MR/TR; G1DD.   Cellulitis of left lower extremity 09/18/2022   Chest pain with high risk for cardiac etiology 07/02/2016   Chronic use of opiate drug for therapeutic purpose 02/13/2022   Formatting of this note might be different from the original. [facility] Pain Contract signed on 04/18/16 & updated 03/04/17; UDS done on 04/18/16.   Complication of anesthesia    Diabetic foot infection (HCC) 03/13/2015   Diabetic foot ulcer (HCC) 02/08/2021   Diabetic ulcer of left  heel associated with type 2 diabetes mellitus (HCC) 02/08/2021   Eczema    Family history of adverse reaction to anesthesia    a.) PONV in mother and grandmother   Gas gangrene (HCC) 09/18/2022   GERD (gastroesophageal reflux disease) 05/10/2014   History of kidney stones    History of MSSA bacteremia due to osteomyelitis L foot 09/18/22 10/22/2022   HLD (hyperlipidemia)    Hyponatremia 03/13/2015   Migraines    Motion sickness    MSSA bacteremia 09/20/2022   Osteomyelitis (HCC) 09/18/2022   Other acute osteomyelitis, left ankle and foot (HCC) 02/13/2022   Panic attacks    Peripheral edema 04/27/2018   Pneumonia    PONV (postoperative nausea and vomiting)    Sepsis secondary to diabetic foot infection 03/13/2015   Sepsis secondary to diabetic foot infection 03/13/2015   T2DM (type 2 diabetes mellitus) (HCC)    Takotsubo cardiomyopathy / transient apical balooning syndrome / stress-induced cardiomyopathy 05/10/2014   Unspecified essential hypertension     Past Surgical History:  Procedure Laterality Date   AMPUTATION Left 09/19/2022   Procedure: AMPUTATION BELOW KNEE;  Surgeon: Renford Dills, MD;  Location: [facility];  Service: Vascular;  Laterality: Left;   AMPUTATION Left 09/24/2022   Procedure: AMPUTATION BELOW KNEE WITH WOUND CLOSURE;  Surgeon: Renford Dills, MD;  Location: [facility];  Service: Vascular;  Laterality: Left;   AMPUTATION TOE Left 03/16/2015   Procedure: left fifth toe amputation with incision and drainage;  Surgeon: Gwyneth Revels, DPM;  Location: [facility];  Service: Podiatry;  Laterality: Left;   APPLICATION OF WOUND VAC Left 02/14/2021   Procedure: APPLICATION OF WOUND VAC;  Surgeon: Gwyneth Revels, DPM;  Location: [facility];  Service: Podiatry;  Laterality: Left;   APPLICATION OF WOUND VAC Left 04/16/2021   Procedure: APPLICATION OF WOUND VAC;  Surgeon: Peggye Form, DO;  Location: [facility];  Service: Plastics;  Laterality: Left;    CARDIAC CATHETERIZATION Left 06/04/2009   Procedure: CARDIAC CATHETERIZATION; Location: [facility]   CHOLECYSTECTOMY     DEBRIDEMENT AND CLOSURE WOUND Left 04/16/2021   Procedure: DEBRIDEMENT AND CLOSURE WOUND;  Surgeon: Peggye Form, DO;  Location: [facility];  Service: Plastics;  Laterality: Left;  1 hour   IR IMAGING GUIDED PORT INSERTION  11/18/2022   IRRIGATION AND DEBRIDEMENT FOOT Left 02/08/2021   Procedure: IRRIGATION AND DEBRIDEMENT FOOT - LFT HEEL ULCER;  Surgeon: Gwyneth Revels, DPM;  Location: [facility];  Service: Podiatry;  Laterality: Left;   IRRIGATION AND DEBRIDEMENT FOOT Left 02/14/2021   Procedure: IRRIGATION AND DEBRIDEMENT LEFT HEEL;  Surgeon: Gwyneth Revels, DPM;  Location: [facility];  Service: Podiatry;  Laterality: Left;   kidney stone removal     KNEE ARTHROSCOPY W/ MENISCAL REPAIR     NOSE SURGERY  01/07/1989   due to fracture   TEE WITHOUT CARDIOVERSION N/A 09/26/2022   Procedure: TRANSESOPHAGEAL ECHOCARDIOGRAM;  Surgeon: Clotilde Dieter, DO;  Location: [facility];  Service: Cardiovascular;  Laterality: N/A;   TOE AMPUTATION     second toe   TONSILLECTOMY     TOTAL KNEE ARTHROPLASTY  01/11/2011   Procedure: TOTAL KNEE ARTHROPLASTY;  Surgeon: Harvie Junior;  Location: [facility];  Service: Orthopedics;  Laterality: Right;  COMPUTER ASSISTED TOTAL KNEE REPLACEMENT    Social History   Socioeconomic History   Marital status: Married    Spouse name: Not on file   Number of children: Not on file   Years of education: Not on file   Highest education level: Not on file  Occupational History   Not on file  Tobacco Use   Smoking status: Former    Current packs/day: 0.00    Types: Cigarettes    Quit date: 03/12/1985    Years since quitting: 37.8   Smokeless tobacco: Never  Vaping Use   Vaping status: Never Used  Substance and Sexual Activity   Alcohol use: No   Drug use: No   Sexual activity: Yes    Birth control/protection:  Post-menopausal  Other Topics Concern   Not on file  Social History Narrative   Wife, [name], at bedside. One indoor pet, dog.   Social Drivers of Corporate investment banker Strain: Not on file  Food Insecurity: No Food Insecurity (10/22/2022)   Hunger Vital Sign    Worried About Running Out of Food in the Last Year: Never true    Ran Out of Food in the Last Year: Never true  Transportation Needs: No Transportation Needs (10/22/2022)   PRAPARE - Administrator, Civil Service (Medical): No    Lack of Transportation (Non-Medical): No  Physical Activity: Not on file  Stress: Not on file  Social Connections: Not on file  Intimate Partner Violence: Not At Risk (10/22/2022)   Humiliation, Afraid, Rape, and Kick questionnaire    Fear  of Current or Ex-Partner: No    Emotionally Abused: No    Physically Abused: No    Sexually Abused: No    Family History  Problem Relation Age of Onset   Transient ischemic attack Mother    Asthma Mother    Hypertension Mother    Lung cancer Father 58   Hypertension Father    Asthma Sister    Anxiety disorder Sister    Asthma Sister    Hypertension Brother    Ovarian cancer Maternal Aunt 48   Melanoma Maternal Uncle        dx 93s   Lung cancer Paternal Aunt 22   Stomach cancer Maternal Grandmother    Colon cancer Maternal Grandmother    Lymphoma Maternal Grandmother    Melanoma Maternal Grandfather 72       sun exposure   Lung cancer Paternal Grandmother 58   Kidney cancer Paternal Grandfather        dx early   Crohn's disease Other    GER disease Other    Breast cancer Neg Hx     Allergies  Allergen Reactions   Shellfish Allergy Anaphylaxis   Codeine Other (See Comments)    Migraine  Other Reaction(s): Other (See Comments)  Reaction:  Severe migraines   Morphine And Codeine Other (See Comments)    Reaction:  Severe migraines    Rosuvastatin     Other reaction(s): Muscle Pain   Percocet [Oxycodone-Acetaminophen]  Nausea And Vomiting   Sulfa Antibiotics Rash   Theophyllines Itching and Rash    Review of Systems  All other systems reviewed and are negative.      Objective:   BP (!) 158/68   Pulse 61   Ht 5\' 9"  (1.753 m)   Wt 258 lb (117 kg)   LMP 02/08/2013 (Within Months)   SpO2 98%   BMI 38.10 kg/m   Vitals:   11/05/22 1257  BP: (!) 158/68  Pulse: 61  Height: 5\' 9"  (1.753 m)  Weight: 258 lb (117 kg)  SpO2: 98%  BMI (Calculated): 38.08    Physical Exam Vitals and nursing note reviewed.  Constitutional:      Appearance: Normal appearance. She is obese.  HENT:     Head: Normocephalic.  Eyes:     Extraocular Movements: Extraocular movements intact.     Conjunctiva/sclera: Conjunctivae normal.     Pupils: Pupils are equal, round, and reactive to light.  Cardiovascular:     Rate and Rhythm: Normal rate.  Pulmonary:     Effort: Pulmonary effort is normal.  Abdominal:     Tenderness: There is abdominal tenderness.  Musculoskeletal:     Left Lower Extremity: Left leg is amputated below knee.  Neurological:     General: No focal deficit present.     Mental Status: She is alert and oriented to person, place, and time. Mental status is at baseline.  Psychiatric:        Attention and Perception: Attention normal.        Mood and Affect: Mood is anxious. Affect is tearful.        Behavior: Behavior normal.        Thought Content: Thought content normal.        Cognition and Memory: Cognition and memory normal.        Judgment: Judgment normal.      No results found for any visits on 11/05/22.  Recent Results (from the past 2160 hours)  Glucose, capillary  Status: Abnormal   Collection Time: 09/23/22  9:10 PM  Result Value Ref Range   Glucose-Capillary 167 (H) 70 - 99 mg/dL    Comment: Glucose reference range applies only to samples taken after fasting for at least 8 hours.   Comment 1 Notify RN   Surgical pathology     Status: None   Collection Time: 09/24/22  12:00 AM  Result Value Ref Range   SURGICAL PATHOLOGY      SURGICAL PATHOLOGY [facility] [address] [place], [place] [address] Telephone [phone] or [phone] Fax [phone]  REPORT OF SURGICAL PATHOLOGY   Accession #: [id number] Patient Name: CORAZON, BONO Visit # : [id number]  MRN: [id number] Physician: [name] DOB/Age [birth year] (Age: 59) Gender: F Collected Date: 09/24/2022 Received Date: 09/24/2022  FINAL DIAGNOSIS       1. Leg, amputation, Left below the knee :       CHRONIC OSTEOMYELITIS WITH BONE MARROW HEMORRHAGE AND FOCAL NECROSIS.      SEVERE ATHEROSCLEROSIS WITH CALCIFICATIONS.      RESECTION MARGIN IS VIABLE WITHOUT NECROSIS.       DATE SIGNED OUT: 10/03/2022 ELECTRONIC SIGNATURE : Lance Coon Md, Pathologist, Electronic Signature  MICROSCOPIC DESCRIPTION  CASE COMMENTS STAINS USED IN DIAGNOSIS: H&E H&E H&E H&E H&E H&E    CLINICAL HISTORY  SPECIMEN(S) OBTAINED 1. Leg, amputation, Left Below The Knee  SPECIMEN COMMENTS: SPEC IMEN CLINICAL INFORMATION: 1. Osteomyelitis    Gross Description 1. Received fresh is a 17.4 cm in length product of left below-the-knee amputation which is consistent with clinically stated "Guillotine amputation" above the ankle and below-the-knee.  The specimen has 1.8 cm of exposed fibula and 2.8 cm in length exposed tibia at the presumed proximal end.  Tibia and fibula at the presumed distal end are evenly resected and flush with the soft tissue.  The specimen has pink white to hyperemic skin with no distinct lesions. On sectioning, soft tissue near the presumed distal end is edematous.  Anterior tibial, peroneal and posterior tibial arteries have moderate to severe atherosclerosis.  The presumed proximal margin is grossly viable.      Block summary:      A= sections near presumed distal end.      B= anterior tibial artery.      C= peroneal artery.       D= posterior tibial artery.      E= reamings from bone at presumed distal end.      F= reamings from bones a t presumed proximal margin.      Bone and arteries are decalcified.  Total 6 blocks.  (SW:gt, 09/25/22)        Report signed out from the following location(s) [facility] [address], [place] [address] CLIA #: [id number]  [facility] [address] [place], [place] [address] CLIA #: [id number]   Basic metabolic panel     Status: Abnormal   Collection Time: 09/24/22  3:42 AM  Result Value Ref Range   Sodium 136 135 - 145 mmol/L   Potassium 4.5 3.5 - 5.1 mmol/L   Chloride 102 98 - 111 mmol/L   CO2 28 22 - 32 mmol/L   Glucose, Bld 198 (H) 70 - 99 mg/dL    Comment: Glucose reference range applies only to samples taken after fasting for at least 8 hours.   BUN 20 6 - 20 mg/dL   Creatinine, Ser 0.93 0.44 -  1.00 mg/dL   Calcium 8.3 (L) 8.9 - 10.3 mg/dL   GFR, Estimated >16 >10 mL/min    Comment: (NOTE) Calculated using the CKD-EPI Creatinine Equation (2021)    Anion gap 6 5 - 15    Comment: Performed at [facility], [address] [place], [place] [address]  CBC     Status: Abnormal   Collection Time: 09/24/22  3:42 AM  Result Value Ref Range   WBC 13.6 (H) 4.0 - 10.5 K/uL   RBC 3.16 (L) 3.87 - 5.11 MIL/uL   Hemoglobin 9.0 (L) 12.0 - 15.0 g/dL   HCT 40.9 (L) 81.1 - 91.4 %   MCV 88.6 80.0 - 100.0 fL   MCH 28.5 26.0 - 34.0 pg   MCHC 32.1 30.0 - 36.0 g/dL   RDW 78.2 95.6 - 21.3 %   Platelets 557 (H) 150 - 400 K/uL   nRBC 0.1 0.0 - 0.2 %    Comment: Performed at [facility], [address] [place], [place] [address]  Magnesium     Status: None   Collection Time: 09/24/22  3:42 AM  Result Value Ref Range   Magnesium 1.8 1.7 - 2.4 mg/dL    Comment: Performed at [facility], [address], [place], [place] [address]  Phosphorus     Status: None   Collection Time: 09/24/22  3:42 AM   Result Value Ref Range   Phosphorus 3.9 2.5 - 4.6 mg/dL    Comment: Performed at [facility], [address] [place], [place] [address]  Glucose, capillary     Status: Abnormal   Collection Time: 09/24/22  7:57 AM  Result Value Ref Range   Glucose-Capillary 205 (H) 70 - 99 mg/dL    Comment: Glucose reference range applies only to samples taken after fasting for at least 8 hours.  Glucose, capillary     Status: Abnormal   Collection Time: 09/24/22 11:48 AM  Result Value Ref Range   Glucose-Capillary 185 (H) 70 - 99 mg/dL    Comment: Glucose reference range applies only to samples taken after fasting for at least 8 hours.  Glucose, capillary     Status: Abnormal   Collection Time: 09/24/22 12:52 PM  Result Value Ref Range   Glucose-Capillary 215 (H) 70 - 99 mg/dL    Comment: Glucose reference range applies only to samples taken after fasting for at least 8 hours.  Glucose, capillary     Status: Abnormal   Collection Time: 09/24/22  5:07 PM  Result Value Ref Range   Glucose-Capillary 309 (H) 70 - 99 mg/dL    Comment: Glucose reference range applies only to samples taken after fasting for at least 8 hours.  Glucose, capillary     Status: Abnormal   Collection Time: 09/24/22  9:28 PM  Result Value Ref Range   Glucose-Capillary 251 (H) 70 - 99 mg/dL    Comment: Glucose reference range applies only to samples taken after fasting for at least 8 hours.  Basic metabolic panel     Status: Abnormal   Collection Time: 09/25/22  2:36 AM  Result Value Ref Range   Sodium 136 135 - 145 mmol/L   Potassium 5.0 3.5 - 5.1 mmol/L   Chloride 101 98 - 111 mmol/L   CO2 26 22 - 32 mmol/L   Glucose, Bld 237 (H) 70 - 99 mg/dL    Comment: Glucose reference range applies only to samples taken after fasting for at least 8 hours.  BUN 20 6 - 20 mg/dL   Creatinine, Ser 1.61 0.44 - 1.00 mg/dL   Calcium 8.5 (L) 8.9 - 10.3 mg/dL   GFR, Estimated >09 >60 mL/min    Comment: (NOTE) Calculated using  the CKD-EPI Creatinine Equation (2021)    Anion gap 9 5 - 15    Comment: Performed at [facility], [address] [place], [place] [address]  CBC     Status: Abnormal   Collection Time: 09/25/22  2:36 AM  Result Value Ref Range   WBC 17.1 (H) 4.0 - 10.5 K/uL   RBC 2.94 (L) 3.87 - 5.11 MIL/uL   Hemoglobin 8.4 (L) 12.0 - 15.0 g/dL   HCT 81.1 (L) 91.4 - 78.2 %   MCV 92.5 80.0 - 100.0 fL   MCH 28.6 26.0 - 34.0 pg   MCHC 30.9 30.0 - 36.0 g/dL   RDW 95.6 21.3 - 08.6 %   Platelets 570 (H) 150 - 400 K/uL   nRBC 0.0 0.0 - 0.2 %    Comment: Performed at [facility], [address] [place], [place] [address]  Glucose, capillary     Status: Abnormal   Collection Time: 09/25/22  8:42 AM  Result Value Ref Range   Glucose-Capillary 210 (H) 70 - 99 mg/dL    Comment: Glucose reference range applies only to samples taken after fasting for at least 8 hours.  Glucose, capillary     Status: Abnormal   Collection Time: 09/25/22 11:38 AM  Result Value Ref Range   Glucose-Capillary 224 (H) 70 - 99 mg/dL    Comment: Glucose reference range applies only to samples taken after fasting for at least 8 hours.  Glucose, capillary     Status: Abnormal   Collection Time: 09/25/22  3:39 PM  Result Value Ref Range   Glucose-Capillary 198 (H) 70 - 99 mg/dL    Comment: Glucose reference range applies only to samples taken after fasting for at least 8 hours.  Glucose, capillary     Status: Abnormal   Collection Time: 09/25/22  9:27 PM  Result Value Ref Range   Glucose-Capillary 185 (H) 70 - 99 mg/dL    Comment: Glucose reference range applies only to samples taken after fasting for at least 8 hours.  Basic metabolic panel     Status: Abnormal   Collection Time: 09/26/22  2:25 AM  Result Value Ref Range   Sodium 136 135 - 145 mmol/L   Potassium 4.6 3.5 - 5.1 mmol/L   Chloride 101 98 - 111 mmol/L   CO2 28 22 - 32 mmol/L   Glucose, Bld 248 (H) 70 - 99 mg/dL    Comment: Glucose reference  range applies only to samples taken after fasting for at least 8 hours.   BUN 24 (H) 6 - 20 mg/dL   Creatinine, Ser 9.62 0.44 - 1.00 mg/dL   Calcium 8.1 (L) 8.9 - 10.3 mg/dL   GFR, Estimated >95 >28 mL/min    Comment: (NOTE) Calculated using the CKD-EPI Creatinine Equation (2021)    Anion gap 7 5 - 15    Comment: Performed at [facility], [address] [place], [place] [address]  CBC     Status: Abnormal   Collection Time: 09/26/22  2:25 AM  Result Value Ref Range   WBC 12.3 (H) 4.0 - 10.5 K/uL   RBC 2.62 (L) 3.87 - 5.11 MIL/uL   Hemoglobin 7.6 (L) 12.0 - 15.0 g/dL   HCT 40.1 (L) 02.7 -  46.0 %   MCV 90.1 80.0 - 100.0 fL   MCH 29.0 26.0 - 34.0 pg   MCHC 32.2 30.0 - 36.0 g/dL   RDW 16.1 09.6 - 04.5 %   Platelets 514 (H) 150 - 400 K/uL   nRBC 0.0 0.0 - 0.2 %    Comment: Performed at [facility], [address] [place], [place] [address]  Glucose, capillary     Status: Abnormal   Collection Time: 09/26/22  7:43 AM  Result Value Ref Range   Glucose-Capillary 205 (H) 70 - 99 mg/dL    Comment: Glucose reference range applies only to samples taken after fasting for at least 8 hours.  Glucose, capillary     Status: Abnormal   Collection Time: 09/26/22 11:50 AM  Result Value Ref Range   Glucose-Capillary 149 (H) 70 - 99 mg/dL    Comment: Glucose reference range applies only to samples taken after fasting for at least 8 hours.  Glucose, capillary     Status: Abnormal   Collection Time: 09/26/22  3:53 PM  Result Value Ref Range   Glucose-Capillary 191 (H) 70 - 99 mg/dL    Comment: Glucose reference range applies only to samples taken after fasting for at least 8 hours.  Glucose, capillary     Status: Abnormal   Collection Time: 09/26/22  9:01 PM  Result Value Ref Range   Glucose-Capillary 226 (H) 70 - 99 mg/dL    Comment: Glucose reference range applies only to samples taken after fasting for at least 8 hours.  CBC     Status: Abnormal   Collection Time:  09/27/22  4:56 AM  Result Value Ref Range   WBC 10.0 4.0 - 10.5 K/uL   RBC 2.87 (L) 3.87 - 5.11 MIL/uL   Hemoglobin 8.4 (L) 12.0 - 15.0 g/dL   HCT 19.1 (L) 47.8 - 29.5 %   MCV 90.9 80.0 - 100.0 fL   MCH 29.3 26.0 - 34.0 pg   MCHC 32.2 30.0 - 36.0 g/dL   RDW 62.1 30.8 - 65.7 %   Platelets 530 (H) 150 - 400 K/uL   nRBC 0.0 0.0 - 0.2 %    Comment: Performed at [facility], [address] [place], [place] [address]  Glucose, capillary     Status: Abnormal   Collection Time: 09/27/22  8:39 AM  Result Value Ref Range   Glucose-Capillary 220 (H) 70 - 99 mg/dL    Comment: Glucose reference range applies only to samples taken after fasting for at least 8 hours.  Glucose, capillary     Status: Abnormal   Collection Time: 09/27/22 12:03 PM  Result Value Ref Range   Glucose-Capillary 180 (H) 70 - 99 mg/dL    Comment: Glucose reference range applies only to samples taken after fasting for at least 8 hours.  Glucose, capillary     Status: Abnormal   Collection Time: 09/27/22  4:02 PM  Result Value Ref Range   Glucose-Capillary 179 (H) 70 - 99 mg/dL    Comment: Glucose reference range applies only to samples taken after fasting for at least 8 hours.  Glucose, capillary     Status: Abnormal   Collection Time: 09/27/22  9:30 PM  Result Value Ref Range   Glucose-Capillary 106 (H) 70 - 99 mg/dL    Comment: Glucose reference range applies only to samples taken after fasting for at least 8 hours.  Glucose, capillary     Status: Abnormal   Collection Time:  09/28/22  8:20 AM  Result Value Ref Range   Glucose-Capillary 175 (H) 70 - 99 mg/dL    Comment: Glucose reference range applies only to samples taken after fasting for at least 8 hours.  Glucose, capillary     Status: Abnormal   Collection Time: 09/28/22 11:37 AM  Result Value Ref Range   Glucose-Capillary 182 (H) 70 - 99 mg/dL    Comment: Glucose reference range applies only to samples taken after fasting for at least 8 hours.   Glucose, capillary     Status: Abnormal   Collection Time: 09/28/22  5:11 PM  Result Value Ref Range   Glucose-Capillary 175 (H) 70 - 99 mg/dL    Comment: Glucose reference range applies only to samples taken after fasting for at least 8 hours.  Glucose, capillary     Status: Abnormal   Collection Time: 09/28/22  9:08 PM  Result Value Ref Range   Glucose-Capillary 185 (H) 70 - 99 mg/dL    Comment: Glucose reference range applies only to samples taken after fasting for at least 8 hours.  Glucose, capillary     Status: Abnormal   Collection Time: 09/29/22  8:18 AM  Result Value Ref Range   Glucose-Capillary 164 (H) 70 - 99 mg/dL    Comment: Glucose reference range applies only to samples taken after fasting for at least 8 hours.  Glucose, capillary     Status: Abnormal   Collection Time: 09/29/22 11:42 AM  Result Value Ref Range   Glucose-Capillary 197 (H) 70 - 99 mg/dL    Comment: Glucose reference range applies only to samples taken after fasting for at least 8 hours.  Glucose, capillary     Status: Abnormal   Collection Time: 09/29/22  4:25 PM  Result Value Ref Range   Glucose-Capillary 161 (H) 70 - 99 mg/dL    Comment: Glucose reference range applies only to samples taken after fasting for at least 8 hours.  Glucose, capillary     Status: Abnormal   Collection Time: 09/29/22  9:23 PM  Result Value Ref Range   Glucose-Capillary 172 (H) 70 - 99 mg/dL    Comment: Glucose reference range applies only to samples taken after fasting for at least 8 hours.  CBC     Status: Abnormal   Collection Time: 09/30/22  6:10 AM  Result Value Ref Range   WBC 9.5 4.0 - 10.5 K/uL   RBC 2.81 (L) 3.87 - 5.11 MIL/uL   Hemoglobin 8.0 (L) 12.0 - 15.0 g/dL   HCT 32.4 (L) 40.1 - 02.7 %   MCV 92.2 80.0 - 100.0 fL   MCH 28.5 26.0 - 34.0 pg   MCHC 30.9 30.0 - 36.0 g/dL   RDW 25.3 66.4 - 40.3 %   Platelets 499 (H) 150 - 400 K/uL   nRBC 0.0 0.0 - 0.2 %    Comment: Performed at [facility],  [address] [place], [place] [address]  Basic metabolic panel     Status: Abnormal   Collection Time: 09/30/22  6:10 AM  Result Value Ref Range   Sodium 136 135 - 145 mmol/L   Potassium 4.3 3.5 - 5.1 mmol/L   Chloride 100 98 - 111 mmol/L   CO2 30 22 - 32 mmol/L   Glucose, Bld 171 (H) 70 - 99 mg/dL    Comment: Glucose reference range applies only to samples taken after fasting for at least 8 hours.   BUN 30 (H) 6 - 20  mg/dL   Creatinine, Ser 1.61 0.44 - 1.00 mg/dL   Calcium 8.9 8.9 - 09.6 mg/dL   GFR, Estimated >04 >54 mL/min    Comment: (NOTE) Calculated using the CKD-EPI Creatinine Equation (2021)    Anion gap 6 5 - 15    Comment: Performed at [facility], [address] [place], [place] [address]  Glucose, capillary     Status: Abnormal   Collection Time: 09/30/22  8:28 AM  Result Value Ref Range   Glucose-Capillary 152 (H) 70 - 99 mg/dL    Comment: Glucose reference range applies only to samples taken after fasting for at least 8 hours.  Glucose, capillary     Status: Abnormal   Collection Time: 09/30/22 11:22 AM  Result Value Ref Range   Glucose-Capillary 144 (H) 70 - 99 mg/dL    Comment: Glucose reference range applies only to samples taken after fasting for at least 8 hours.  Lab report - scanned     Status: None   Collection Time: 10/07/22 12:00 AM  Result Value Ref Range   EGFR 102.0     Comment: Abstracted by HIM  Aerobic culture     Status: Abnormal   Collection Time: 10/16/22 10:50 AM  Result Value Ref Range   Aerobic Bacterial Culture Final report (A)    Organism ID, Bacteria Serratia marcescens (A)     Comment: Heavy growth   Antimicrobial Susceptibility Comment     Comment:       ** S = Susceptible; I = Intermediate; R = Resistant **                    P = Positive; N = Negative             MICS are expressed in micrograms per mL    Antibiotic                 RSLT#1    RSLT#2    RSLT#3    RSLT#4 Amoxicillin/Clavulanic Acid    R Cefazolin                       R Cefepime                       S Ceftriaxone                    S Cefuroxime                     R Ciprofloxacin                  S Ertapenem                      S Gentamicin                     S Levofloxacin                   S Meropenem                      S Tetracycline                   R Tobramycin                     S Trimethoprim/Sulfa  S   Lactic acid, plasma     Status: None   Collection Time: 10/22/22  3:26 PM  Result Value Ref Range   Lactic Acid, Venous 1.0 0.5 - 1.9 mmol/L    Comment: Performed at [facility], [address] [place], [place] [address]  Comprehensive metabolic panel     Status: Abnormal   Collection Time: 10/22/22  3:26 PM  Result Value Ref Range   Sodium 133 (L) 135 - 145 mmol/L   Potassium 5.1 3.5 - 5.1 mmol/L   Chloride 97 (L) 98 - 111 mmol/L   CO2 24 22 - 32 mmol/L   Glucose, Bld 125 (H) 70 - 99 mg/dL    Comment: Glucose reference range applies only to samples taken after fasting for at least 8 hours.   BUN 54 (H) 6 - 20 mg/dL   Creatinine, Ser 6.04 (H) 0.44 - 1.00 mg/dL   Calcium 8.8 (L) 8.9 - 10.3 mg/dL   Total Protein 8.3 (H) 6.5 - 8.1 g/dL   Albumin 3.0 (L) 3.5 - 5.0 g/dL   AST 14 (L) 15 - 41 U/L   ALT 7 0 - 44 U/L   Alkaline Phosphatase 56 38 - 126 U/L   Total Bilirubin 0.6 0.3 - 1.2 mg/dL   GFR, Estimated 53 (L) >60 mL/min    Comment: (NOTE) Calculated using the CKD-EPI Creatinine Equation (2021)    Anion gap 12 5 - 15    Comment: Performed at [facility], [address] [place], [place] [address]  CBC with Differential     Status: Abnormal   Collection Time: 10/22/22  3:26 PM  Result Value Ref Range   WBC 11.7 (H) 4.0 - 10.5 K/uL   RBC 3.44 (L) 3.87 - 5.11 MIL/uL   Hemoglobin 9.8 (L) 12.0 - 15.0 g/dL   HCT 11.9 (L) 14.7 - 82.9 %   MCV 91.3 80.0 - 100.0 fL   MCH 28.5 26.0 - 34.0 pg   MCHC 31.2 30.0 - 36.0 g/dL   RDW 56.2 13.0 - 86.5 %   Platelets 552 (H) 150 - 400  K/uL   nRBC 0.0 0.0 - 0.2 %   Neutrophils Relative % 74 %   Neutro Abs 8.7 (H) 1.7 - 7.7 K/uL   Lymphocytes Relative 15 %   Lymphs Abs 1.7 0.7 - 4.0 K/uL   Monocytes Relative 7 %   Monocytes Absolute 0.9 0.1 - 1.0 K/uL   Eosinophils Relative 2 %   Eosinophils Absolute 0.2 0.0 - 0.5 K/uL   Basophils Relative 1 %   Basophils Absolute 0.1 0.0 - 0.1 K/uL   Immature Granulocytes 1 %   Abs Immature Granulocytes 0.07 0.00 - 0.07 K/uL    Comment: Performed at [facility], [address] [place], [place] [address]  Urinalysis, w/ Reflex to Culture (Infection Suspected) -Urine, Clean Catch     Status: Abnormal   Collection Time: 10/22/22  3:26 PM  Result Value Ref Range   Specimen Source URINE, CLEAN CATCH    Color, Urine YELLOW (A) YELLOW   APPearance HAZY (A) CLEAR   Specific Gravity, Urine 1.026 1.005 - 1.030   pH 5.0 5.0 - 8.0   Glucose, UA NEGATIVE NEGATIVE mg/dL   Hgb urine dipstick SMALL (A) NEGATIVE   Bilirubin Urine NEGATIVE NEGATIVE   Ketones, ur NEGATIVE NEGATIVE mg/dL   Protein, ur NEGATIVE NEGATIVE mg/dL   Nitrite NEGATIVE NEGATIVE   Leukocytes,Ua NEGATIVE NEGATIVE   RBC / HPF 6-10  0 - 5 RBC/hpf   WBC, UA 6-10 0 - 5 WBC/hpf    Comment:        Reflex urine culture not performed if WBC <=10, OR if Squamous epithelial cells >5. If Squamous epithelial cells >5 suggest recollection.    Bacteria, UA NONE SEEN NONE SEEN   Squamous Epithelial / HPF 0-5 0 - 5 /HPF   Mucus PRESENT    Hyaline Casts, UA PRESENT     Comment: Performed at [facility], [address] [place], [place] [address]  Procalcitonin     Status: None   Collection Time: 10/22/22  3:26 PM  Result Value Ref Range   Procalcitonin 0.12 ng/mL    Comment:        Interpretation: PCT (Procalcitonin) <= 0.5 ng/mL: Systemic infection (sepsis) is not likely. Local bacterial infection is possible. (NOTE)       Sepsis PCT Algorithm           Lower Respiratory Tract                                       Infection PCT Algorithm    ----------------------------     ----------------------------         PCT < 0.25 ng/mL                PCT < 0.10 ng/mL          Strongly encourage             Strongly discourage   discontinuation of antibiotics    initiation of antibiotics    ----------------------------     -----------------------------       PCT 0.25 - 0.50 ng/mL            PCT 0.10 - 0.25 ng/mL               OR       >80% decrease in PCT            Discourage initiation of                                            antibiotics      Encourage discontinuation           of antibiotics    ----------------------------     -----------------------------         PCT >= 0.50 ng/mL              PCT 0.26 - 0.50 ng/mL               AND        <80% decrease in PCT             Encourage initiation of                                             antibiotics       Encourage continuation           of antibiotics    ----------------------------     -----------------------------        PCT >= 0.50 ng/mL  PCT > 0.50 ng/mL               AND         increase in PCT                  Strongly encourage                                      initiation of antibiotics    Strongly encourage escalation           of antibiotics                                     -----------------------------                                           PCT <= 0.25 ng/mL                                                 OR                                        > 80% decrease in PCT                                      Discontinue / Do not initiate                                             antibiotics  Performed at [facility], [address] [place], [place] [address]   Lipase, blood     Status: None   Collection Time: 10/22/22  4:03 PM  Result Value Ref Range   Lipase 23 11 - 51 U/L    Comment: Performed at [facility], [address] [place], [place] [address]  Resp panel by  RT-PCR (RSV, Flu A&B, Covid) Anterior Nasal Swab     Status: None   Collection Time: 10/22/22  4:03 PM   Specimen: Anterior Nasal Swab  Result Value Ref Range   SARS Coronavirus 2 by RT PCR NEGATIVE NEGATIVE    Comment: (NOTE) SARS-CoV-2 target nucleic acids are NOT DETECTED.  The SARS-CoV-2 RNA is generally detectable in upper respiratory specimens during the acute phase of infection. The lowest concentration of SARS-CoV-2 viral copies this assay can detect is 138 copies/mL. A negative result does not preclude SARS-Cov-2 infection and should not be used as the sole basis for treatment or other patient management decisions. A negative result may occur with  improper specimen collection/handling, submission of specimen other than nasopharyngeal swab, presence of viral mutation(s) within the areas targeted by this assay, and inadequate number of viral copies(<138 copies/mL). A negative result must be combined with clinical observations, patient history, and epidemiological information. The expected result is Negative.  Fact Sheet for Patients:  BloggerCourse.com  Fact Sheet for Healthcare Providers:  SeriousBroker.it  This test is no t yet approved or cleared by the [place] FDA and  has been authorized for detection and/or diagnosis of SARS-CoV-2 by FDA under an Emergency Use Authorization (EUA). This EUA will remain  in effect (meaning this test can be used) for the duration of the COVID-19 declaration under Section 564(b)(1) of the Act, 21 U.S.C.section 360bbb-3(b)(1), unless the authorization is terminated  or revoked sooner.       Influenza A by PCR NEGATIVE NEGATIVE   Influenza B by PCR NEGATIVE NEGATIVE    Comment: (NOTE) The Xpert Xpress SARS-CoV-2/FLU/RSV plus assay is intended as an aid in the diagnosis of influenza from Nasopharyngeal swab specimens and should not be used as a sole basis for treatment. Nasal  washings and aspirates are unacceptable for Xpert Xpress SARS-CoV-2/FLU/RSV testing.  Fact Sheet for Patients: BloggerCourse.com  Fact Sheet for Healthcare Providers: SeriousBroker.it  This test is not yet approved or cleared by the [place] FDA and has been authorized for detection and/or diagnosis of SARS-CoV-2 by FDA under an Emergency Use Authorization (EUA). This EUA will remain in effect (meaning this test can be used) for the duration of the COVID-19 declaration under Section 564(b)(1) of the Act, 21 U.S.C. section 360bbb-3(b)(1), unless the authorization is terminated or revoked.     Resp Syncytial Virus by PCR NEGATIVE NEGATIVE    Comment: (NOTE) Fact Sheet for Patients: BloggerCourse.com  Fact Sheet for Healthcare Providers: SeriousBroker.it  This test is not yet approved or cleared by the [place] FDA and has been authorized for detection and/or diagnosis of SARS-CoV-2 by FDA under an Emergency Use Authorization (EUA). This EUA will remain in effect (meaning this test can be used) for the duration of the COVID-19 declaration under Section 564(b)(1) of the Act, 21 U.S.C. section 360bbb-3(b)(1), unless the authorization is terminated or revoked.  Performed at [facility], [address] [place], [place] [address]   Glucose, capillary     Status: None   Collection Time: 10/22/22  9:49 PM  Result Value Ref Range   Glucose-Capillary 92 70 - 99 mg/dL    Comment: Glucose reference range applies only to samples taken after fasting for at least 8 hours.  Cytology - Non PAP;     Status: None   Collection Time: 10/23/22 12:00 AM  Result Value Ref Range   CYTOLOGY - NON GYN      CYTOLOGY - NON PAP [facility] [address] [place], [place] [address] Telephone [phone] or [phone] Fax [phone]  CYTOPATHOLOGY REPORT   Accession #: [id number] Patient Name: GAIGE, BRADSTREET Visit # : [id number]  MRN: [id number] Physician: Lolita Patella DOB/Age [birth year] (Age: 40) Gender: F Collected Date: 10/23/2022 Received Date: 10/23/2022  FINAL DIAGNOSIS STATEMENT Of SPECIMEN ADEQUACY:  INTERPRETATION(S):      - MALIGNANT CELLS PRESENT      - SEE NOTE   Diagnosis Note : The patient's clinical history and recent CT findings of large volume ascites and omental caking concerning for peritoneal carcinomatosis are noted.  The peritoneal fluid is positive for malignant cells. Immunohistochemical stains (CK7, CK20, PAX8, ER, WT1, GATA3, TTF-1 and CDX2) to further characterize the tumor cells are pending and will be reported in an addendum.  Dr. Georgeann Oppenheim was notified via secure chat on 10/25/2022.    CORRECTED [id number]: Immunohistochemistry Addendum:  The sparse cellularity makes the accurate evaluation  of the immunohistochemical stains problematic.  Additional tissue sampling is recommended to further classify the tumor cells.  Clinical and radiological correlation recommended.  DATE SIGNED OUT: 10/25/2022 ELECTRONIC SIGNATURE : Corey Harold M.D., Dossie Arbour., Pathologist, Electronic Signature   CASE COMMENTS   CLINICAL HISTORY  SOURCE OF SPECIMEN(S) Peritoneal/Ascitic Fluid  SPECIMEN COMMENTS: SPECIMEN CLINICAL INFORMATION:    Gross Description Specimen: Received is/are of cloudy light brown fluid and one prestained slide      Prepared:      # Smears: 1      # Concentration Technique Slides (i.e. ThinPrep): 1      # Cell Block: 1      # Diff-Quick Stain: 0        Report signed out from the following location(s) [place] [address], [place] [address] CLIA #: [id number]  [facility] [address] [place], [place] [address] CLIA #: [id number]   Glucose, capillary     Status:  Abnormal   Collection Time: 10/23/22  4:08 AM  Result Value Ref Range   Glucose-Capillary 110 (H) 70 - 99 mg/dL    Comment: Glucose reference range applies only to samples taken after fasting for at least 8 hours.  Comprehensive metabolic panel     Status: Abnormal   Collection Time: 10/23/22  5:10 AM  Result Value Ref Range   Sodium 132 (L) 135 - 145 mmol/L   Potassium 4.8 3.5 - 5.1 mmol/L   Chloride 99 98 - 111 mmol/L   CO2 24 22 - 32 mmol/L   Glucose, Bld 127 (H) 70 - 99 mg/dL    Comment: Glucose reference range applies only to samples taken after fasting for at least 8 hours.   BUN 48 (H) 6 - 20 mg/dL   Creatinine, Ser 2.02 (H) 0.44 - 1.00 mg/dL   Calcium 8.3 (L) 8.9 - 10.3 mg/dL   Total Protein 7.3 6.5 - 8.1 g/dL   Albumin 2.6 (L) 3.5 - 5.0 g/dL   AST 13 (L) 15 - 41 U/L   ALT 7 0 - 44 U/L   Alkaline Phosphatase 53 38 - 126 U/L   Total Bilirubin 0.5 0.3 - 1.2 mg/dL   GFR, Estimated 57 (L) >60 mL/min    Comment: (NOTE) Calculated using the CKD-EPI Creatinine Equation (2021)    Anion gap 9 5 - 15    Comment: Performed at [facility], [address] [place], [place] [address]  CBC     Status: Abnormal   Collection Time: 10/23/22  5:10 AM  Result Value Ref Range   WBC 9.7 4.0 - 10.5 K/uL   RBC 3.22 (L) 3.87 - 5.11 MIL/uL   Hemoglobin 9.2 (L) 12.0 - 15.0 g/dL   HCT 62.3 (L) 76.2 - 83.1 %   MCV 90.4 80.0 - 100.0 fL   MCH 28.6 26.0 - 34.0 pg   MCHC 31.6 30.0 - 36.0 g/dL   RDW 51.7 61.6 - 07.3 %   Platelets 537 (H) 150 - 400 K/uL   nRBC 0.0 0.0 - 0.2 %    Comment: Performed at [facility], [address] [place], [place] [address]  Brain natriuretic peptide     Status: None   Collection Time: 10/23/22  5:10 AM  Result Value Ref Range   B Natriuretic Peptide 42.8 0.0 - 100.0 pg/mL    Comment: Performed at [facility], [address] [place], [place] [address]  Glucose,  capillary     Status: Abnormal   Collection Time: 10/23/22  7:38 AM   Result Value Ref Range   Glucose-Capillary 140 (H) 70 - 99 mg/dL    Comment: Glucose reference range applies only to samples taken after fasting for at least 8 hours.  Glucose, capillary     Status: Abnormal   Collection Time: 10/23/22 11:08 AM  Result Value Ref Range   Glucose-Capillary 66 (L) 70 - 99 mg/dL    Comment: Glucose reference range applies only to samples taken after fasting for at least 8 hours.  Glucose, capillary     Status: Abnormal   Collection Time: 10/23/22 11:35 AM  Result Value Ref Range   Glucose-Capillary 63 (L) 70 - 99 mg/dL    Comment: Glucose reference range applies only to samples taken after fasting for at least 8 hours.  Glucose, capillary     Status: None   Collection Time: 10/23/22 12:01 PM  Result Value Ref Range   Glucose-Capillary 77 70 - 99 mg/dL    Comment: Glucose reference range applies only to samples taken after fasting for at least 8 hours.  Body fluid cell count with differential     Status: Abnormal   Collection Time: 10/23/22  2:20 PM  Result Value Ref Range   Fluid Type-FCT CYTO PERI    Color, Fluid YELLOW (A) YELLOW   Appearance, Fluid CLOUDY (A) CLEAR   Total Nucleated Cell Count, Fluid 5,696 cu mm   Neutrophil Count, Fluid 29 %   Lymphs, Fluid 46 %   Monocyte-Macrophage-Serous Fluid 25 %   Eos, Fluid 0 %    Comment: Performed at [facility], [address] [place], [place] [address]  Albumin, pleural or peritoneal fluid      Status: None   Collection Time: 10/23/22  2:20 PM  Result Value Ref Range   Albumin, Fluid 2.3 g/dL    Comment: (NOTE) No normal range established for this test Results should be evaluated in conjunction with serum values    Fluid Type-FALB CYTO PERI     Comment: Performed at [facility], [address] [place], [place] [address]  Lipase, Fluid     Status: None   Collection Time: 10/23/22  2:20 PM  Result Value Ref Range   Lipase-Fluid 8 U/L    Comment: (NOTE) INTERPRETIVE  INFORMATION: Lipase, Fluid For information on body fluid reference ranges and/or interpretive guidance visit MetroFlorists.tn This test was developed and its performance characteristics determined by [facility]. It has not been cleared or approved by the [place] Food and Drug Administration. This test was performed in a CLIA certified laboratory and is intended for clinical purposes. Performed At: [facility] [address] [place], [place] [id number] Evans Lance MDPhD [id number]    Source of Sample PERITONEAL     Comment: Performed at [facility], [address] [place], [place] [address]  Body fluid culture w Gram Stain     Status: None   Collection Time: 10/23/22  2:20 PM   Specimen: PATH Cytology Peritoneal fluid  Result Value Ref Range   Specimen Description      PERITONEAL Performed at [facility], [address] [place], [place] [address]    Special Requests      PERITONEAL Performed at [facility], [address] [place], [place] [address]    Gram Stain      WBC PRESENT,BOTH PMN AND MONONUCLEAR NO ORGANISMS SEEN CYTOSPIN  SMEAR    Culture      NO GROWTH 3 DAYS Performed at [facility], [address] [place], [place] [address]    Report Status 10/27/2022 FINAL   Glucose, capillary     Status: Abnormal   Collection Time: 10/23/22  3:43 PM  Result Value Ref Range   Glucose-Capillary 124 (H) 70 - 99 mg/dL    Comment: Glucose reference range applies only to samples taken after fasting for at least 8 hours.  Glucose, capillary     Status: Abnormal   Collection Time: 10/23/22  7:26 PM  Result Value Ref Range   Glucose-Capillary 59 (L) 70 - 99 mg/dL    Comment: Glucose reference range applies only to samples taken after fasting for at least 8 hours.  Glucose, capillary     Status: None   Collection Time: 10/23/22  8:36 PM  Result Value Ref Range   Glucose-Capillary 71 70 - 99 mg/dL     Comment: Glucose reference range applies only to samples taken after fasting for at least 8 hours.  Glucose, capillary     Status: Abnormal   Collection Time: 10/23/22 11:30 PM  Result Value Ref Range   Glucose-Capillary 103 (H) 70 - 99 mg/dL    Comment: Glucose reference range applies only to samples taken after fasting for at least 8 hours.  Glucose, capillary     Status: Abnormal   Collection Time: 10/24/22  4:09 AM  Result Value Ref Range   Glucose-Capillary 120 (H) 70 - 99 mg/dL    Comment: Glucose reference range applies only to samples taken after fasting for at least 8 hours.  Glucose, capillary     Status: Abnormal   Collection Time: 10/24/22  8:18 AM  Result Value Ref Range   Glucose-Capillary 120 (H) 70 - 99 mg/dL    Comment: Glucose reference range applies only to samples taken after fasting for at least 8 hours.   Comment 1 Notify RN    Comment 2 Document in Chart   Glucose, capillary     Status: Abnormal   Collection Time: 10/24/22 11:32 AM  Result Value Ref Range   Glucose-Capillary 141 (H) 70 - 99 mg/dL    Comment: Glucose reference range applies only to samples taken after fasting for at least 8 hours.   Comment 1 Notify RN    Comment 2 Document in Chart   Glucose, capillary     Status: Abnormal   Collection Time: 10/24/22  3:28 PM  Result Value Ref Range   Glucose-Capillary 137 (H) 70 - 99 mg/dL    Comment: Glucose reference range applies only to samples taken after fasting for at least 8 hours.   Comment 1 Notify RN    Comment 2 Document in Chart   Glucose, capillary     Status: Abnormal   Collection Time: 10/24/22  7:21 PM  Result Value Ref Range   Glucose-Capillary 134 (H) 70 - 99 mg/dL    Comment: Glucose reference range applies only to samples taken after fasting for at least 8 hours.  Glucose, capillary     Status: Abnormal   Collection Time: 10/24/22 11:14 PM  Result Value Ref Range   Glucose-Capillary 133 (H) 70 - 99 mg/dL    Comment: Glucose  reference range applies only to samples taken after fasting for at least 8 hours.  Glucose, capillary     Status: Abnormal   Collection Time: 10/25/22  3:33 AM  Result Value Ref Range  Glucose-Capillary 122 (H) 70 - 99 mg/dL    Comment: Glucose reference range applies only to samples taken after fasting for at least 8 hours.   Comment 1 Notify RN   Glucose, capillary     Status: Abnormal   Collection Time: 10/25/22  8:14 AM  Result Value Ref Range   Glucose-Capillary 125 (H) 70 - 99 mg/dL    Comment: Glucose reference range applies only to samples taken after fasting for at least 8 hours.   Comment 1 Notify RN    Comment 2 Document in Chart   Glucose, capillary     Status: Abnormal   Collection Time: 10/25/22 11:42 AM  Result Value Ref Range   Glucose-Capillary 182 (H) 70 - 99 mg/dL    Comment: Glucose reference range applies only to samples taken after fasting for at least 8 hours.   Comment 1 Notify RN    Comment 2 Document in Chart   Glucose, capillary     Status: Abnormal   Collection Time: 10/25/22  4:15 PM  Result Value Ref Range   Glucose-Capillary 140 (H) 70 - 99 mg/dL    Comment: Glucose reference range applies only to samples taken after fasting for at least 8 hours.   Comment 1 Notify RN    Comment 2 Document in Chart   Glucose, capillary     Status: Abnormal   Collection Time: 10/25/22  8:42 PM  Result Value Ref Range   Glucose-Capillary 152 (H) 70 - 99 mg/dL    Comment: Glucose reference range applies only to samples taken after fasting for at least 8 hours.  Glucose, capillary     Status: Abnormal   Collection Time: 10/26/22 12:34 AM  Result Value Ref Range   Glucose-Capillary 116 (H) 70 - 99 mg/dL    Comment: Glucose reference range applies only to samples taken after fasting for at least 8 hours.  Glucose, capillary     Status: Abnormal   Collection Time: 10/26/22  7:59 AM  Result Value Ref Range   Glucose-Capillary 127 (H) 70 - 99 mg/dL    Comment: Glucose  reference range applies only to samples taken after fasting for at least 8 hours.   Comment 1 Notify RN    Comment 2 Document in Chart   CBC with Differential/Platelet     Status: Abnormal   Collection Time: 10/26/22  8:07 AM  Result Value Ref Range   WBC 10.2 4.0 - 10.5 K/uL   RBC 3.59 (L) 3.87 - 5.11 MIL/uL   Hemoglobin 10.2 (L) 12.0 - 15.0 g/dL   HCT 69.6 (L) 29.5 - 28.4 %   MCV 90.5 80.0 - 100.0 fL   MCH 28.4 26.0 - 34.0 pg   MCHC 31.4 30.0 - 36.0 g/dL   RDW 13.2 44.0 - 10.2 %   Platelets 689 (H) 150 - 400 K/uL   nRBC 0.0 0.0 - 0.2 %   Neutrophils Relative % 61 %   Neutro Abs 6.3 1.7 - 7.7 K/uL   Lymphocytes Relative 24 %   Lymphs Abs 2.5 0.7 - 4.0 K/uL   Monocytes Relative 8 %   Monocytes Absolute 0.8 0.1 - 1.0 K/uL   Eosinophils Relative 4 %   Eosinophils Absolute 0.4 0.0 - 0.5 K/uL   Basophils Relative 2 %   Basophils Absolute 0.2 (H) 0.0 - 0.1 K/uL   Immature Granulocytes 1 %   Abs Immature Granulocytes 0.05 0.00 - 0.07 K/uL    Comment: Performed at [facility], [address] [place], [place] [address]  Basic metabolic panel     Status: Abnormal   Collection Time: 10/26/22  8:07 AM  Result Value Ref Range   Sodium 134 (L) 135 - 145 mmol/L   Potassium 5.5 (H) 3.5 - 5.1 mmol/L   Chloride 100 98 - 111 mmol/L   CO2 27 22 - 32 mmol/L   Glucose, Bld 150 (H) 70 - 99 mg/dL    Comment: Glucose reference range applies only to samples taken after fasting for at least 8 hours.   BUN 34 (H) 6 - 20 mg/dL   Creatinine, Ser 8.41 0.44 - 1.00 mg/dL   Calcium 8.9 8.9 - 32.4 mg/dL   GFR, Estimated >40 >10 mL/min    Comment: (NOTE) Calculated using the CKD-EPI Creatinine Equation (2021)    Anion gap 7 5 - 15    Comment: Performed at [facility], [address] [place], [place] [address]  CA 19-9 (SERIAL)     Status: None   Collection Time: 10/26/22  8:07 AM  Result Value Ref Range   CA 19-9 5 0 - 35 U/mL    Comment: (NOTE) [employer]  Electrochemiluminescence Immunoassay (ECLIA) Values obtained with different assay methods or kits cannot be used interchangeably.  Results cannot be interpreted as absolute evidence of the presence or absence of malignant disease. Performed At: [facility] [address] [place], [place] [id number] Jolene Schimke MD [id number]   CA 125     Status: Abnormal   Collection Time: 10/26/22  8:07 AM  Result Value Ref Range   Cancer Antigen (CA) 125 762.0 (H) 0.0 - 38.1 U/mL    Comment: (NOTE) Roche [employer]ostics Electrochemiluminescence Immunoassay (ECLIA) Values obtained with different assay methods or kits cannot be used interchangeably.  Results cannot be interpreted as absolute evidence of the presence or absence of malignant disease. Performed At: [facility] [address] [place], [place] [id number] Jolene Schimke MD YS:[id number]   CEA     Status: None   Collection Time: 10/26/22  8:07 AM  Result Value Ref Range   CEA <0.6 0.0 - 4.7 ng/mL    Comment: (NOTE)                             Nonsmokers          <3.9                             Smokers             <5.6 Roche [employer]ostics Electrochemiluminescence Immunoassay (ECLIA) Values obtained with different assay methods or kits cannot be used interchangeably.  Results cannot be interpreted as absolute evidence of the presence or absence of malignant disease. Performed At: [facility] [address] [place], [place] [id number] Jolene Schimke MD [id number]   Glucose, capillary     Status: Abnormal   Collection Time: 10/26/22 12:01 PM  Result Value Ref Range   Glucose-Capillary 153 (H) 70 - 99 mg/dL    Comment: Glucose reference range applies only to samples taken after fasting for at least 8 hours.   Comment 1 Notify RN    Comment 2 Document in Chart   Glucose, capillary     Status: Abnormal   Collection Time: 10/26/22  4:50 PM  Result Value Ref Range   Glucose-Capillary 158 (H) 70 - 99 mg/dL  Comment: Glucose reference range applies only to samples taken after fasting for at least 8 hours.   Comment 1 Notify RN    Comment 2 Document in Chart   Glucose, capillary     Status: Abnormal   Collection Time: 10/26/22  8:20 PM  Result Value Ref Range   Glucose-Capillary 167 (H) 70 - 99 mg/dL    Comment: Glucose reference range applies only to samples taken after fasting for at least 8 hours.  Glucose, capillary     Status: Abnormal   Collection Time: 10/26/22  8:53 PM  Result Value Ref Range   Glucose-Capillary 142 (H) 70 - 99 mg/dL    Comment: Glucose reference range applies only to samples taken after fasting for at least 8 hours.  Glucose, capillary     Status: Abnormal   Collection Time: 10/26/22 11:56 PM  Result Value Ref Range   Glucose-Capillary 147 (H) 70 - 99 mg/dL    Comment: Glucose reference range applies only to samples taken after fasting for at least 8 hours.  Glucose, capillary     Status: Abnormal   Collection Time: 10/27/22  3:32 AM  Result Value Ref Range   Glucose-Capillary 136 (H) 70 - 99 mg/dL    Comment: Glucose reference range applies only to samples taken after fasting for at least 8 hours.  Glucose, capillary     Status: Abnormal   Collection Time: 10/27/22  7:39 AM  Result Value Ref Range   Glucose-Capillary 139 (H) 70 - 99 mg/dL    Comment: Glucose reference range applies only to samples taken after fasting for at least 8 hours.  Glucose, capillary     Status: Abnormal   Collection Time: 10/27/22 11:16 AM  Result Value Ref Range   Glucose-Capillary 178 (H) 70 - 99 mg/dL    Comment: Glucose reference range applies only to samples taken after fasting for at least 8 hours.   Comment 1 Notify RN    Comment 2 Document in Chart   Glucose, capillary     Status: Abnormal   Collection Time: 10/27/22  4:24 PM  Result Value Ref Range   Glucose-Capillary 146 (H) 70 - 99 mg/dL    Comment: Glucose reference range applies only to samples taken after fasting  for at least 8 hours.   Comment 1 Notify RN    Comment 2 Document in Chart   Glucose, capillary     Status: Abnormal   Collection Time: 10/27/22  7:31 PM  Result Value Ref Range   Glucose-Capillary 178 (H) 70 - 99 mg/dL    Comment: Glucose reference range applies only to samples taken after fasting for at least 8 hours.  Glucose, capillary     Status: Abnormal   Collection Time: 10/27/22 11:56 PM  Result Value Ref Range   Glucose-Capillary 131 (H) 70 - 99 mg/dL    Comment: Glucose reference range applies only to samples taken after fasting for at least 8 hours.  Glucose, capillary     Status: Abnormal   Collection Time: 10/28/22  4:28 AM  Result Value Ref Range   Glucose-Capillary 157 (H) 70 - 99 mg/dL    Comment: Glucose reference range applies only to samples taken after fasting for at least 8 hours.  Glucose, capillary     Status: Abnormal   Collection Time: 10/28/22  7:38 AM  Result Value Ref Range   Glucose-Capillary 152 (H) 70 - 99 mg/dL    Comment: Glucose reference range applies only to  samples taken after fasting for at least 8 hours.   Comment 1 Notify RN    Comment 2 Document in Chart   Glucose, capillary     Status: Abnormal   Collection Time: 10/28/22 11:39 AM  Result Value Ref Range   Glucose-Capillary 158 (H) 70 - 99 mg/dL    Comment: Glucose reference range applies only to samples taken after fasting for at least 8 hours.  Glucose, capillary     Status: Abnormal   Collection Time: 10/28/22  3:35 PM  Result Value Ref Range   Glucose-Capillary 137 (H) 70 - 99 mg/dL    Comment: Glucose reference range applies only to samples taken after fasting for at least 8 hours.   Comment 1 Notify RN    Comment 2 Document in Chart   Glucose, capillary     Status: Abnormal   Collection Time: 10/28/22  8:17 PM  Result Value Ref Range   Glucose-Capillary 164 (H) 70 - 99 mg/dL    Comment: Glucose reference range applies only to samples taken after fasting for at least 8 hours.   Glucose, capillary     Status: Abnormal   Collection Time: 10/28/22 11:49 PM  Result Value Ref Range   Glucose-Capillary 155 (H) 70 - 99 mg/dL    Comment: Glucose reference range applies only to samples taken after fasting for at least 8 hours.  Glucose, capillary     Status: Abnormal   Collection Time: 10/29/22  4:06 AM  Result Value Ref Range   Glucose-Capillary 131 (H) 70 - 99 mg/dL    Comment: Glucose reference range applies only to samples taken after fasting for at least 8 hours.  CBC with Differential/Platelet     Status: Abnormal   Collection Time: 10/29/22  4:52 AM  Result Value Ref Range   WBC 9.4 4.0 - 10.5 K/uL   RBC 3.51 (L) 3.87 - 5.11 MIL/uL   Hemoglobin 10.0 (L) 12.0 - 15.0 g/dL   HCT 96.2 (L) 95.2 - 84.1 %   MCV 91.7 80.0 - 100.0 fL   MCH 28.5 26.0 - 34.0 pg   MCHC 31.1 30.0 - 36.0 g/dL   RDW 32.4 40.1 - 02.7 %   Platelets 671 (H) 150 - 400 K/uL   nRBC 0.0 0.0 - 0.2 %   Neutrophils Relative % 56 %   Neutro Abs 5.2 1.7 - 7.7 K/uL   Lymphocytes Relative 29 %   Lymphs Abs 2.7 0.7 - 4.0 K/uL   Monocytes Relative 8 %   Monocytes Absolute 0.8 0.1 - 1.0 K/uL   Eosinophils Relative 5 %   Eosinophils Absolute 0.5 0.0 - 0.5 K/uL   Basophils Relative 1 %   Basophils Absolute 0.1 0.0 - 0.1 K/uL   Immature Granulocytes 1 %   Abs Immature Granulocytes 0.10 (H) 0.00 - 0.07 K/uL    Comment: Performed at [facility], [address] [place], [place] [address]  Basic metabolic panel     Status: Abnormal   Collection Time: 10/29/22  4:52 AM  Result Value Ref Range   Sodium 133 (L) 135 - 145 mmol/L   Potassium 4.8 3.5 - 5.1 mmol/L   Chloride 98 98 - 111 mmol/L   CO2 26 22 - 32 mmol/L   Glucose, Bld 147 (H) 70 - 99 mg/dL    Comment: Glucose reference range applies only to samples taken after fasting for at least 8 hours.   BUN 33 (H) 6 - 20 mg/dL  Creatinine, Ser 0.74 0.44 - 1.00 mg/dL   Calcium 8.7 (L) 8.9 - 10.3 mg/dL   GFR, Estimated >08 >65 mL/min     Comment: (NOTE) Calculated using the CKD-EPI Creatinine Equation (2021)    Anion gap 9 5 - 15    Comment: Performed at [facility], [address] [place], [place] [address]  Glucose, capillary     Status: Abnormal   Collection Time: 10/29/22  8:39 AM  Result Value Ref Range   Glucose-Capillary 178 (H) 70 - 99 mg/dL    Comment: Glucose reference range applies only to samples taken after fasting for at least 8 hours.   Comment 1 Notify RN    Comment 2 Document in Chart   Glucose, capillary     Status: Abnormal   Collection Time: 10/29/22 11:47 AM  Result Value Ref Range   Glucose-Capillary 166 (H) 70 - 99 mg/dL    Comment: Glucose reference range applies only to samples taken after fasting for at least 8 hours.   Comment 1 Notify RN    Comment 2 Document in Chart   Glucose, capillary     Status: Abnormal   Collection Time: 10/29/22  4:39 PM  Result Value Ref Range   Glucose-Capillary 145 (H) 70 - 99 mg/dL    Comment: Glucose reference range applies only to samples taken after fasting for at least 8 hours.  Glucose, capillary     Status: Abnormal   Collection Time: 10/29/22  6:44 PM  Result Value Ref Range   Glucose-Capillary 161 (H) 70 - 99 mg/dL    Comment: Glucose reference range applies only to samples taken after fasting for at least 8 hours.   Comment 1 Notify RN    Comment 2 Document in Chart   Glucose, capillary     Status: Abnormal   Collection Time: 10/29/22  8:38 PM  Result Value Ref Range   Glucose-Capillary 157 (H) 70 - 99 mg/dL    Comment: Glucose reference range applies only to samples taken after fasting for at least 8 hours.  Glucose, capillary     Status: Abnormal   Collection Time: 10/30/22  1:37 AM  Result Value Ref Range   Glucose-Capillary 138 (H) 70 - 99 mg/dL    Comment: Glucose reference range applies only to samples taken after fasting for at least 8 hours.   Comment 1 QC Due   Glucose, capillary     Status: Abnormal   Collection Time:  10/30/22  4:13 AM  Result Value Ref Range   Glucose-Capillary 131 (H) 70 - 99 mg/dL    Comment: Glucose reference range applies only to samples taken after fasting for at least 8 hours.  Phosphorus     Status: None   Collection Time: 10/30/22  4:54 AM  Result Value Ref Range   Phosphorus 4.1 2.5 - 4.6 mg/dL    Comment: Performed at [facility], [address] [place], [place] [address]  Magnesium     Status: None   Collection Time: 10/30/22  4:54 AM  Result Value Ref Range   Magnesium 1.7 1.7 - 2.4 mg/dL    Comment: Performed at [facility] [address] [place], [place] [address]  Glucose, capillary     Status: Abnormal   Collection Time: 10/30/22  8:48 AM  Result Value Ref Range   Glucose-Capillary 137 (H) 70 - 99 mg/dL    Comment: Glucose reference range applies only to samples taken after fasting for at  least 8 hours.  Glucose, capillary     Status: Abnormal   Collection Time: 10/30/22 11:37 AM  Result Value Ref Range   Glucose-Capillary 148 (H) 70 - 99 mg/dL    Comment: Glucose reference range applies only to samples taken after fasting for at least 8 hours.  POCT CBG (Fasting - Glucose)     Status: Abnormal   Collection Time: 11/12/22  1:02 PM  Result Value Ref Range   Glucose Fasting, POC 113 (A) 70 - 99 mg/dL  POCT XPERT XPRESS SARS COVID-2/FLU/RSV     Status: None   Collection Time: 11/12/22  2:51 PM  Result Value Ref Range   SARS Coronavirus 2 neg    FLU A neg    FLU B neg    RSV RNA, PCR neg   Surgical pathology     Status: None   Collection Time: 11/18/22 12:00 AM  Result Value Ref Range   SURGICAL PATHOLOGY      SURGICAL PATHOLOGY [facility] [address] [place], [place] [address] Telephone [phone] or [phone] Fax [phone]  REPORT OF SURGICAL PATHOLOGY   Accession #: [id number] Patient Name: HANIAH, BELLIZZI Visit # : [id number]  MRN: [id number] Physician: Olive Bass DOB/Age 12/08/1965 (Age: 58) Gender: F Collected Date: 11/18/2022 Received Date: 11/18/2022  FINAL DIAGNOSIS       1. 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       Diagnosis Note : The carcinoma is positive for cytokeratin 7, PAX8, WT1, and ER.      Cytokeratin 20 and GATA3 are negative.  P53 is overexpressed.  The morphologic      findings in conjunction with the pattern of immunohistochemistry support the      above diagnosis.  Stain controls worked appropriately.  Dr. Cathie Hoops was notified on      11/20/2022. This case underwent intradepartmental consultation and Dr. [place]      concurs with the in terpretation.      There is sufficient material for ancillary molecular testing (block 1A).      DATE SIGNED OUT: 11/20/2022 ELECTRONIC SIGNATURE : Oneita Kras Md, Delice Bison , Pathologist, Electronic Signature  MICROSCOPIC DESCRIPTION  CASE COMMENTS STAINS USED IN DIAGNOSIS: H&E H&E H&E Universal Negative Control-DAB Stains used in diagnosis 1 CK-7, 1 CK 20, 1 ER - NOACIS, 1 GATA-3, 1 PAX 8, 1 Wilms' Tumor -1, 1 P53 Some of these immunohistochemical stains may have been developed and the performance characteristics determined by [facility].  Some may not have been cleared or approved by the U.S. Food and Drug Administration.  The FDA has determined that such clearance or approval is not necessary.  This test is used for clinical purposes.  It should not be regarded as investigational or for research.  This laboratory is certified under the Clinical Laboratory Improvement Amendments of 1988 (CLIA-88) as qualified to perform high complexity clinical labora tory testing. IHC stains are performed on formalin fixed,paraffin embedded tissue using a 3,3"diaminobenzidine (DAB) chromogen and Leica Bond Autostainer System. The staining intensity of the nucleus is score manually and is reported as the percentage of tumor cell  nuclei demonstrating specific nuclear staining. The specimens are fixed in 10% Neutral Formalin for at least 6 hours and up to 72hrs. These tests are validated on decalcified tissue. Results should be interpreted with caution given the possibility of false negative results on decalcified specimens.  Antibody Clones are as follows ER-clone 46F, PR-clone 16, Ki67- clone MM1.Some of these immunohistochemical stains may have been developed and the performance characteristics determined by [facility].    CLINICAL HISTORY  SPECIMEN(S) OBTAINED 1. Omentum, biopsy,  SPECIMEN COMMENTS: 1. Core needle biopsy 18ga x 4 passes SPECIMEN CLINICAL INFORMATION:    Gross Description 1. Received in formalin, l abeled with patient name only, are 4 gray soft tissue cores (1.0-1.8 cm in length) submitted entirely in blocks 1A-1C.      SMB      11/18/2022        Report signed out from the following location(s) [place]. [facility] [address], [place] [address] CLIA #: [id number]  [facility] [address] [place], [place] [address] CLIA #: [id number]   Glucose, capillary     Status: Abnormal   Collection Time: 11/18/22  9:17 AM  Result Value Ref Range   Glucose-Capillary 108 (H) 70 - 99 mg/dL    Comment: Glucose reference range applies only to samples taken after fasting for at least 8 hours.  CBC upon arrival     Status: Abnormal   Collection Time: 11/18/22  9:19 AM  Result Value Ref Range   WBC 11.0 (H) 4.0 - 10.5 K/uL   RBC 4.16 3.87 - 5.11 MIL/uL   Hemoglobin 11.7 (L) 12.0 - 15.0 g/dL   HCT 38.7 56.4 - 33.2 %   MCV 89.9 80.0 - 100.0 fL   MCH 28.1 26.0 - 34.0 pg   MCHC 31.3 30.0 - 36.0 g/dL   RDW 95.1 88.4 - 16.6 %   Platelets 504 (H) 150 - 400 K/uL   nRBC 0.0 0.0 - 0.2 %    Comment: Performed at [facility], [address] [place], [place] [address]  Protime-INR upon arrival     Status: None   Collection Time:  11/18/22  9:19 AM  Result Value Ref Range   Prothrombin Time 15.0 11.4 - 15.2 seconds   INR 1.2 0.8 - 1.2    Comment: (NOTE) INR goal varies based on device and disease states. Performed at [facility], [address] [place], [place] [address]   Retic Panel     Status: Abnormal   Collection Time: 11/21/22  8:09 AM  Result Value Ref Range   Retic Ct Pct 1.4 0.4 - 3.1 %   RBC. 3.87 3.87 - 5.11 MIL/uL   Retic Count, Absolute 54.6 19.0 - 186.0 K/uL   Immature Retic Fract 18.8 (H) 2.3 - 15.9 %   Reticulocyte Hemoglobin 28.4 >27.9 pg    Comment:           If this patient has chronic kidney disease and does not have a hemoglobinopathy a RET-He < 29 pg meets criteria for iron deficiency per the 2016 NICE guidelines.    Refer to specific guidelines to determine the appropriate thresholds for treating CKD- associated iron deficiency. TSAT and ferritin should be used in patients with hemoglobinopathies (e.g. thalassemia). Performed at [facility], [address] [place], [place] [address]   CA 125     Status: Abnormal   Collection Time: 11/21/22  8:09 AM  Result Value Ref Range   Cancer Antigen (CA) 125 944.0 (H) 0.0 - 38.1 U/mL    Comment: (NOTE) Roche Diagnostics Electrochemiluminescence Immunoassay (ECLIA) Values obtained with different assay methods or kits cannot be used interchangeably.  Results cannot be interpreted as absolute evidence of the presence or  absence of malignant disease. Performed At: [facility] [address] [place], [place] [id number] Jolene Schimke MD WU:[id number]   CMP (Cancer Center only)     Status: Abnormal   Collection Time: 11/21/22  8:09 AM  Result Value Ref Range   Sodium 136 135 - 145 mmol/L   Potassium 4.6 3.5 - 5.1 mmol/L   Chloride 103 98 - 111 mmol/L   CO2 24 22 - 32 mmol/L   Glucose, Bld 121 (H) 70 - 99 mg/dL    Comment: Glucose reference range applies only to samples taken after fasting for at least 8  hours.   BUN 25 (H) 6 - 20 mg/dL   Creatinine 9.56 (H) 2.13 - 1.00 mg/dL   Calcium 8.8 (L) 8.9 - 10.3 mg/dL   Total Protein 7.4 6.5 - 8.1 g/dL   Albumin 3.0 (L) 3.5 - 5.0 g/dL   AST 16 15 - 41 U/L   ALT 12 0 - 44 U/L   Alkaline Phosphatase 43 38 - 126 U/L   Total Bilirubin 0.2 <1.2 mg/dL   GFR, Estimated >08 >65 mL/min    Comment: (NOTE) Calculated using the CKD-EPI Creatinine Equation (2021)    Anion gap 9 5 - 15    Comment: Performed at [facility], [address]  [place], [place] [address]  CBC with Differential (Cancer Center Only)     Status: Abnormal   Collection Time: 11/21/22  8:09 AM  Result Value Ref Range   WBC Count 9.9 4.0 - 10.5 K/uL   RBC 3.86 (L) 3.87 - 5.11 MIL/uL   Hemoglobin 10.8 (L) 12.0 - 15.0 g/dL   HCT 62.9 (L) 52.8 - 41.3 %   MCV 89.9 80.0 - 100.0 fL   MCH 28.0 26.0 - 34.0 pg   MCHC 31.1 30.0 - 36.0 g/dL   RDW 24.4 01.0 - 27.2 %   Platelet Count 431 (H) 150 - 400 K/uL   nRBC 0.0 0.0 - 0.2 %   Neutrophils Relative % 58 %   Neutro Abs 5.9 1.7 - 7.7 K/uL   Lymphocytes Relative 25 %   Lymphs Abs 2.5 0.7 - 4.0 K/uL   Monocytes Relative 8 %   Monocytes Absolute 0.7 0.1 - 1.0 K/uL   Eosinophils Relative 7 %   Eosinophils Absolute 0.7 (H) 0.0 - 0.5 K/uL   Basophils Relative 1 %   Basophils Absolute 0.1 0.0 - 0.1 K/uL   Immature Granulocytes 1 %   Abs Immature Granulocytes 0.05 0.00 - 0.07 K/uL    Comment: Performed at [facility], [address] [place], [place] [address]  CBC with Differential (Cancer Center Only)     Status: Abnormal   Collection Time: 11/28/22 12:59 PM  Result Value Ref Range   WBC Count 5.3 4.0 - 10.5 K/uL   RBC 3.55 (L) 3.87 - 5.11 MIL/uL   Hemoglobin 10.0 (L) 12.0 - 15.0 g/dL   HCT 40.3 (L) 47.4 - 25.9 %   MCV 89.6 80.0 - 100.0 fL   MCH 28.2 26.0 - 34.0 pg   MCHC 31.4 30.0 - 36.0 g/dL   RDW 56.3 87.5 - 64.3 %   Platelet Count 347 150 - 400 K/uL   nRBC 0.0 0.0 - 0.2 %   Neutrophils Relative % 46 %   Neutro  Abs 2.5 1.7 - 7.7 K/uL   Lymphocytes Relative 37 %   Lymphs Abs 2.0 0.7 - 4.0 K/uL   Monocytes Relative 6 %   Monocytes Absolute  0.3 0.1 - 1.0 K/uL   Eosinophils Relative 9 %   Eosinophils Absolute 0.5 0.0 - 0.5 K/uL   Basophils Relative 1 %   Basophils Absolute 0.1 0.0 - 0.1 K/uL   Immature Granulocytes 1 %   Abs Immature Granulocytes 0.03 0.00 - 0.07 K/uL    Comment: Performed at [facility], [address] [place], [place] [address]  CMP (Cancer Center only)     Status: Abnormal   Collection Time: 11/28/22 12:59 PM  Result Value Ref Range   Sodium 138 135 - 145 mmol/L   Potassium 4.7 3.5 - 5.1 mmol/L   Chloride 101 98 - 111 mmol/L   CO2 26 22 - 32 mmol/L   Glucose, Bld 123 (H) 70 - 99 mg/dL    Comment: Glucose reference range applies only to samples taken after fasting for at least 8 hours.   BUN 25 (H) 6 - 20 mg/dL   Creatinine 9.81 1.91 - 1.00 mg/dL   Calcium 9.1 8.9 - 47.8 mg/dL   Total Protein 7.2 6.5 - 8.1 g/dL   Albumin 3.2 (L) 3.5 - 5.0 g/dL   AST 22 15 - 41 U/L   ALT 14 0 - 44 U/L   Alkaline Phosphatase 40 38 - 126 U/L   Total Bilirubin 0.4 <1.2 mg/dL   GFR, Estimated >29 >56 mL/min    Comment: (NOTE) Calculated using the CKD-EPI Creatinine Equation (2021)    Anion gap 11 5 - 15    Comment: Performed at [facility], [address] [place], [place] [address]  CMP (Cancer Center only)     Status: Abnormal   Collection Time: 12/13/22  8:08 AM  Result Value Ref Range   Sodium 137 135 - 145 mmol/L   Potassium 4.0 3.5 - 5.1 mmol/L   Chloride 101 98 - 111 mmol/L   CO2 27 22 - 32 mmol/L   Glucose, Bld 136 (H) 70 - 99 mg/dL    Comment: Glucose reference range applies only to samples taken after fasting for at least 8 hours.   BUN 21 (H) 6 - 20 mg/dL   Creatinine 6.57 8.46 - 1.00 mg/dL   Calcium 9.2 8.9 - 96.2 mg/dL   Total Protein 7.5 6.5 - 8.1 g/dL   Albumin 3.3 (L) 3.5 - 5.0 g/dL   AST 15 15 - 41 U/L   ALT 15 0 - 44 U/L   Alkaline Phosphatase  45 38 - 126 U/L   Total Bilirubin 0.2 <1.2 mg/dL   GFR, Estimated >95 >28 mL/min    Comment: (NOTE) Calculated using the CKD-EPI Creatinine Equation (2021)    Anion gap 9 5 - 15    Comment: Performed at [facility], [address] [place], [place] [address]  CBC with Differential (Cancer Center Only)     Status: Abnormal   Collection Time: 12/13/22  8:08 AM  Result Value Ref Range   WBC Count 7.1 4.0 - 10.5 K/uL   RBC 3.43 (L) 3.87 - 5.11 MIL/uL   Hemoglobin 9.7 (L) 12.0 - 15.0 g/dL   HCT 40.1 (L) 02.7 - 25.3 %   MCV 88.6 80.0 - 100.0 fL   MCH 28.3 26.0 - 34.0 pg   MCHC 31.9 30.0 - 36.0 g/dL   RDW 66.4 40.3 - 47.4 %   Platelet Count 391 150 - 400 K/uL   nRBC 0.0 0.0 - 0.2 %   Neutrophils Relative % 58 %   Neutro Abs 4.1 1.7 -  7.7 K/uL   Lymphocytes Relative 30 %   Lymphs Abs 2.1 0.7 - 4.0 K/uL   Monocytes Relative 8 %   Monocytes Absolute 0.6 0.1 - 1.0 K/uL   Eosinophils Relative 2 %   Eosinophils Absolute 0.2 0.0 - 0.5 K/uL   Basophils Relative 1 %   Basophils Absolute 0.1 0.0 - 0.1 K/uL   Immature Granulocytes 1 %   Abs Immature Granulocytes 0.04 0.00 - 0.07 K/uL    Comment: Performed at [facility], [address] [place], [place] [address]  CA 125     Status: Abnormal   Collection Time: 12/13/22  8:08 AM  Result Value Ref Range   Cancer Antigen (CA) 125 332.0 (H) 0.0 - 38.1 U/mL    Comment: (NOTE) Roche [employer]ostics Electrochemiluminescence Immunoassay (ECLIA) Values obtained with different assay methods or kits cannot be used interchangeably.  Results cannot be interpreted as absolute evidence of the presence or absence of malignant disease. Performed At: [facility] [address] [place], [place] [id number] Jolene Schimke MD GY:[id number]        Assessment & Plan:   Problem List Items Addressed This Visit       Active Problems   Clinical depression - Primary   Doing well with current medications.  Continue current  POC Patient stable.   Will reassess at f/u.      Adjustment disorder with mixed anxiety and depressed mood   Patient seems to be adjusting well to her new situation, will continue to monitor this going forward.  She and her wife will let me know if there is anything that they need or are specifically concerned about with regards to her mental health.   Reassess at follow up.       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  She is well controlled with current therapy.   Will defer to them for further changes to plan of care.       Obesity, Class III, BMI 40-49.9 (morbid obesity) (HCC)   Will continue to see what I can do to help her get her Mounjaro, as this was the most beneficial for control of her weight and her diabetes.  I will let her know if I am able to find a way to get it for her.   Reassess at follow up.        Return in about 1 week (around 11/12/2022) for F/U.   Total time spent: 30 minutes  Miki Kins, FNP  11/05/2022   This document may have been prepared by [facility] Voice Recognition software and as such may include unintentional dictation errors.

## 2022-12-22 NOTE — Assessment & Plan Note (Signed)
Patient seems to be adjusting well to her new situation, will continue to monitor this going forward.  She and her wife will let me know if there is anything that they need or are specifically concerned about with regards to her mental health.   Reassess at follow up.

## 2022-12-22 NOTE — Assessment & Plan Note (Signed)
Doing well with current medications.  Continue current POC Patient stable.   Will reassess at f/u.

## 2022-12-22 NOTE — Assessment & Plan Note (Signed)
Patient is seen by Vascular, who manage this condition.  She is well controlled with current therapy.   Will defer to them for further changes to plan of care.

## 2022-12-22 NOTE — Assessment & Plan Note (Signed)
Will continue to see what I can do to help her get her [facility], as this was the most beneficial for control of her weight and her diabetes.  I will let her know if I am able to find a way to get it for her.   Reassess at follow up.

## 2022-12-23 ENCOUNTER — Telehealth: Payer: Self-pay

## 2022-12-23 NOTE — Telephone Encounter (Signed)
Patient left voice mail requesting something for migraine headaches. Oncology has taken her off all her pain meds, so she has nothing for her migraines. Please advise

## 2022-12-25 ENCOUNTER — Other Ambulatory Visit: Payer: Self-pay | Admitting: Hospice and Palliative Medicine

## 2022-12-25 MED ORDER — MIRTAZAPINE 7.5 MG PO TABS: 7.5000 mg | ORAL_TABLET | Freq: Every day | ORAL | 3 refills | Status: DC

## 2022-12-25 NOTE — Progress Notes (Signed)
[facility] Medication Recommendation  Dear Referring Specialist:  Thank you for referring your patient to [facility]. Below you will find my new psychiatric medication recommendations.  Once reviewed, please confirm prescription(s) within 2 business days. If you modify or decline the recommendations, please document these modifications and notify us. If you have any questions or concerns, please call our [facility] provider line at [phone].  Best,   Demetria Pore, MD  --  Name: Nancy [name], Nancy [name] Date of Birth: [birth year] Referring Provider: Laurette Schimke, NP Patient MRN: [id number]  Most recent Assessments: Assessment Date Score Depression (PHQ-9) 12/11/2022 15 Anxiety (GAD-7) 12/11/2022 16  Psychiatric Provider recommends that the Referring Specialist prescribe the below medications.... 1. Start mirtazapine 7.5mg  nightly to support depression, appetite, and sleep.  --  [place] [facility]

## 2022-12-25 NOTE — Progress Notes (Signed)
Per recommendation of psychiatry [facility]) will start patient on mirtazapine 7.5 mg nightly for depression/insomnia.

## 2022-12-25 NOTE — Progress Notes (Signed)
Established Patient Office Visit  Subjective:  Patient ID: Nancy [name], female    DOB: 1965/05/10  Age: 57 y.o. MRN: [id number]  Chief Complaint  Patient presents with   Follow-up    1 week follow up    Patient here today for her 1 week follow up.  She has been feeling "okay" since her last appointment, has a follow up appointment set up with oncology on Friday, is going to start Chemo.  Needs help with her insurance, as they originally approved, then denied her PET scan.   She has not had time to deal with the grief over the loss of her leg, given that her health has gotten worse, and she is now dealing with a cancer diagnosis on top of this.  She is seeing a counselor at the cancer center, and says that she feels better now that she has been through the orientation program for the cancer center and knows more about what she is facing.   Hopefully this will help with her grief and her depressive symptoms.   Was also recently possibly exposed to COVID. Has been having some issues with her sinuses and feeling poorly, but isn't sure if this is related to her cancer diagnosis or if she has COVID.     No other concerns at this time.   Past Medical History:  Diagnosis Date   Abnormal cardiovascular stress test 09/21/2018   Formatting of this note might be different from the original. Lexiscan Myoview 09/16/2018 revealed mild anterior ischemia   Adverse effect of motion 05/10/2014   AKI (acute kidney injury) (HCC) 10/22/2022   Anxiety    Arthritis    r knee   Asthma    Breast cyst 05/10/2014   CAD (coronary artery disease)    a.) LHC 06/04/2009 at [facility]; non-obstructive CAD. b.) CTA with FFR 10/08/2018: extensive mixed plaque proximal to mid LAD (51-69%); Coronary Ca score 217; FFR 0.71 dPDA, 0.86 mLAD, 0.87 dLCx.   CCF (congestive cardiac failure) (HCC) 06/06/2008   a.) 30% EF. b.) TTE 06/03/2011: EF >55%; triv MR. c.) TTE 04/27/2018: EF 55%; mild LVH; triv PR, mild MR/TR;  G1DD.   Cellulitis of left lower extremity 09/18/2022   Chest pain with high risk for cardiac etiology 07/02/2016   Chronic use of opiate drug for therapeutic purpose 02/13/2022   Formatting of this note might be different from the original. [facility] Pain Contract signed on 04/18/16 & updated 03/04/17; UDS done on 04/18/16.   Complication of anesthesia    Diabetic foot infection (HCC) 03/13/2015   Diabetic foot ulcer (HCC) 02/08/2021   Diabetic ulcer of left heel associated with type 2 diabetes mellitus (HCC) 02/08/2021   Eczema    Family history of adverse reaction to anesthesia    a.) PONV in mother and grandmother   Gas gangrene (HCC) 09/18/2022   GERD (gastroesophageal reflux disease) 05/10/2014   History of kidney stones    History of MSSA bacteremia due to osteomyelitis L foot 09/18/22 10/22/2022   HLD (hyperlipidemia)    Hyponatremia 03/13/2015   Migraines    Motion sickness    MSSA bacteremia 09/20/2022   Osteomyelitis (HCC) 09/18/2022   Other acute osteomyelitis, left ankle and foot (HCC) 02/13/2022   Panic attacks    Peripheral edema 04/27/2018   Pneumonia    PONV (postoperative nausea and vomiting)    Sepsis secondary to diabetic foot infection 03/13/2015   Sepsis secondary to diabetic foot infection 03/13/2015   T2DM (type  2 diabetes mellitus) (HCC)    Takotsubo cardiomyopathy / transient apical balooning syndrome / stress-induced cardiomyopathy 05/10/2014   Unspecified essential hypertension     Past Surgical History:  Procedure Laterality Date   AMPUTATION Left 09/19/2022   Procedure: AMPUTATION BELOW KNEE;  Surgeon: Renford Dills, MD;  Location: [facility];  Service: Vascular;  Laterality: Left;   AMPUTATION Left 09/24/2022   Procedure: AMPUTATION BELOW KNEE WITH WOUND CLOSURE;  Surgeon: Renford Dills, MD;  Location: [facility];  Service: Vascular;  Laterality: Left;   AMPUTATION TOE Left 03/16/2015   Procedure: left fifth toe amputation with incision and drainage;   Surgeon: Gwyneth Revels, DPM;  Location: [facility];  Service: Podiatry;  Laterality: Left;   APPLICATION OF WOUND VAC Left 02/14/2021   Procedure: APPLICATION OF WOUND VAC;  Surgeon: Gwyneth Revels, DPM;  Location: [facility];  Service: Podiatry;  Laterality: Left;   APPLICATION OF WOUND VAC Left 04/16/2021   Procedure: APPLICATION OF WOUND VAC;  Surgeon: Peggye Form, DO;  Location: [facility];  Service: Plastics;  Laterality: Left;   CARDIAC CATHETERIZATION Left 06/04/2009   Procedure: CARDIAC CATHETERIZATION; Location: [facility]   CHOLECYSTECTOMY     DEBRIDEMENT AND CLOSURE WOUND Left 04/16/2021   Procedure: DEBRIDEMENT AND CLOSURE WOUND;  Surgeon: Peggye Form, DO;  Location: [facility];  Service: Plastics;  Laterality: Left;  1 hour   IR IMAGING GUIDED PORT INSERTION  11/18/2022   IRRIGATION AND DEBRIDEMENT FOOT Left 02/08/2021   Procedure: IRRIGATION AND DEBRIDEMENT FOOT - LFT HEEL ULCER;  Surgeon: Gwyneth Revels, DPM;  Location: [facility];  Service: Podiatry;  Laterality: Left;   IRRIGATION AND DEBRIDEMENT FOOT Left 02/14/2021   Procedure: IRRIGATION AND DEBRIDEMENT LEFT HEEL;  Surgeon: Gwyneth Revels, DPM;  Location: [facility];  Service: Podiatry;  Laterality: Left;   kidney stone removal     KNEE ARTHROSCOPY W/ MENISCAL REPAIR     NOSE SURGERY  01/07/1989   due to fracture   TEE WITHOUT CARDIOVERSION N/A 09/26/2022   Procedure: TRANSESOPHAGEAL ECHOCARDIOGRAM;  Surgeon: Clotilde Dieter, DO;  Location: [facility];  Service: Cardiovascular;  Laterality: N/A;   TOE AMPUTATION     second toe   TONSILLECTOMY     TOTAL KNEE ARTHROPLASTY  01/11/2011   Procedure: TOTAL KNEE ARTHROPLASTY;  Surgeon: Harvie Junior;  Location: [facility];  Service: Orthopedics;  Laterality: Right;  COMPUTER ASSISTED TOTAL KNEE REPLACEMENT    Social History   Socioeconomic History   Marital status: Married    Spouse name: Not on file   Number of children: Not on  file   Years of education: Not on file   Highest education level: Not on file  Occupational History   Not on file  Tobacco Use   Smoking status: Former    Current packs/day: 0.00    Types: Cigarettes    Quit date: 03/12/1985    Years since quitting: 37.8   Smokeless tobacco: Never  Vaping Use   Vaping status: Never Used  Substance and Sexual Activity   Alcohol use: No   Drug use: No   Sexual activity: Yes    Birth control/protection: Post-menopausal  Other Topics Concern   Not on file  Social History Narrative   Wife, [name], at bedside. One indoor pet, dog.   Social Drivers of Corporate investment banker Strain: Not on file  Food Insecurity: No Food Insecurity (10/22/2022)   Hunger Vital Sign    Worried  About Running Out of Food in the Last Year: Never true    Ran Out of Food in the Last Year: Never true  Transportation Needs: No Transportation Needs (10/22/2022)   PRAPARE - Administrator, Civil Service (Medical): No    Lack of Transportation (Non-Medical): No  Physical Activity: Not on file  Stress: Not on file  Social Connections: Not on file  Intimate Partner Violence: Not At Risk (10/22/2022)   Humiliation, Afraid, Rape, and Kick questionnaire    Fear of Current or Ex-Partner: No    Emotionally Abused: No    Physically Abused: No    Sexually Abused: No    Family History  Problem Relation Age of Onset   Transient ischemic attack Mother    Asthma Mother    Hypertension Mother    Lung cancer Father 37   Hypertension Father    Asthma Sister    Anxiety disorder Sister    Asthma Sister    Hypertension Brother    Ovarian cancer Maternal Aunt 38   Melanoma Maternal Uncle        dx 84s   Lung cancer Paternal Aunt 101   Stomach cancer Maternal Grandmother    Colon cancer Maternal Grandmother    Lymphoma Maternal Grandmother    Melanoma Maternal Grandfather 72       sun exposure   Lung cancer Paternal Grandmother 58   Kidney cancer Paternal  Grandfather        dx early   Crohn's disease Other    GER disease Other    Breast cancer Neg Hx     Allergies  Allergen Reactions   Shellfish Allergy Anaphylaxis   Codeine Other (See Comments)    Migraine  Other Reaction(s): Other (See Comments)  Reaction:  Severe migraines   Morphine And Codeine Other (See Comments)    Reaction:  Severe migraines    Rosuvastatin     Other reaction(s): Muscle Pain   Percocet [Oxycodone-Acetaminophen] Nausea And Vomiting   Sulfa Antibiotics Rash   Theophyllines Itching and Rash    Review of Systems  All other systems reviewed and are negative.      Objective:   BP 122/68   Pulse 76   Ht 5\' 9"  (1.753 m)   Wt 259 lb (117.5 kg)   LMP 02/08/2013 (Within Months)   SpO2 98%   BMI 38.25 kg/m   Vitals:   11/12/22 1253  BP: 122/68  Pulse: 76  Height: 5\' 9"  (1.753 m)  Weight: 259 lb (117.5 kg)  SpO2: 98%  BMI (Calculated): 38.23    Physical Exam Vitals and nursing note reviewed.  Constitutional:      Appearance: Normal appearance. She is obese.  HENT:     Head: Normocephalic.  Eyes:     Extraocular Movements: Extraocular movements intact.     Conjunctiva/sclera: Conjunctivae normal.     Pupils: Pupils are equal, round, and reactive to light.  Cardiovascular:     Rate and Rhythm: Normal rate.  Pulmonary:     Effort: Pulmonary effort is normal.  Neurological:     General: No focal deficit present.     Mental Status: She is alert and oriented to person, place, and time. Mental status is at baseline.  Psychiatric:        Mood and Affect: Mood normal.        Behavior: Behavior normal.        Thought Content: Thought content normal.  Judgment: Judgment normal.      Results for orders placed or performed in visit on 11/12/22  POCT CBG (Fasting - Glucose)  Result Value Ref Range   Glucose Fasting, POC 113 (A) 70 - 99 mg/dL  POCT XPERT XPRESS SARS COVID-2/FLU/RSV  Result Value Ref Range   SARS Coronavirus 2 neg     FLU A neg    FLU B neg    RSV RNA, PCR neg     Recent Results (from the past 2160 hours)  Glucose, capillary     Status: Abnormal   Collection Time: 09/26/22 11:50 AM  Result Value Ref Range   Glucose-Capillary 149 (H) 70 - 99 mg/dL    Comment: Glucose reference range applies only to samples taken after fasting for at least 8 hours.  Glucose, capillary     Status: Abnormal   Collection Time: 09/26/22  3:53 PM  Result Value Ref Range   Glucose-Capillary 191 (H) 70 - 99 mg/dL    Comment: Glucose reference range applies only to samples taken after fasting for at least 8 hours.  Glucose, capillary     Status: Abnormal   Collection Time: 09/26/22  9:01 PM  Result Value Ref Range   Glucose-Capillary 226 (H) 70 - 99 mg/dL    Comment: Glucose reference range applies only to samples taken after fasting for at least 8 hours.  CBC     Status: Abnormal   Collection Time: 09/27/22  4:56 AM  Result Value Ref Range   WBC 10.0 4.0 - 10.5 K/uL   RBC 2.87 (L) 3.87 - 5.11 MIL/uL   Hemoglobin 8.4 (L) 12.0 - 15.0 g/dL   HCT 16.1 (L) 09.6 - 04.5 %   MCV 90.9 80.0 - 100.0 fL   MCH 29.3 26.0 - 34.0 pg   MCHC 32.2 30.0 - 36.0 g/dL   RDW 40.9 81.1 - 91.4 %   Platelets 530 (H) 150 - 400 K/uL   nRBC 0.0 0.0 - 0.2 %    Comment: Performed at [facility], [address] [place], [place] [address]  Glucose, capillary     Status: Abnormal   Collection Time: 09/27/22  8:39 AM  Result Value Ref Range   Glucose-Capillary 220 (H) 70 - 99 mg/dL    Comment: Glucose reference range applies only to samples taken after fasting for at least 8 hours.  Glucose, capillary     Status: Abnormal   Collection Time: 09/27/22 12:03 PM  Result Value Ref Range   Glucose-Capillary 180 (H) 70 - 99 mg/dL    Comment: Glucose reference range applies only to samples taken after fasting for at least 8 hours.  Glucose, capillary     Status: Abnormal   Collection Time: 09/27/22  4:02 PM  Result Value Ref Range    Glucose-Capillary 179 (H) 70 - 99 mg/dL    Comment: Glucose reference range applies only to samples taken after fasting for at least 8 hours.  Glucose, capillary     Status: Abnormal   Collection Time: 09/27/22  9:30 PM  Result Value Ref Range   Glucose-Capillary 106 (H) 70 - 99 mg/dL    Comment: Glucose reference range applies only to samples taken after fasting for at least 8 hours.  Glucose, capillary     Status: Abnormal   Collection Time: 09/28/22  8:20 AM  Result Value Ref Range   Glucose-Capillary 175 (H) 70 - 99 mg/dL    Comment: Glucose reference range applies only to samples  taken after fasting for at least 8 hours.  Glucose, capillary     Status: Abnormal   Collection Time: 09/28/22 11:37 AM  Result Value Ref Range   Glucose-Capillary 182 (H) 70 - 99 mg/dL    Comment: Glucose reference range applies only to samples taken after fasting for at least 8 hours.  Glucose, capillary     Status: Abnormal   Collection Time: 09/28/22  5:11 PM  Result Value Ref Range   Glucose-Capillary 175 (H) 70 - 99 mg/dL    Comment: Glucose reference range applies only to samples taken after fasting for at least 8 hours.  Glucose, capillary     Status: Abnormal   Collection Time: 09/28/22  9:08 PM  Result Value Ref Range   Glucose-Capillary 185 (H) 70 - 99 mg/dL    Comment: Glucose reference range applies only to samples taken after fasting for at least 8 hours.  Glucose, capillary     Status: Abnormal   Collection Time: 09/29/22  8:18 AM  Result Value Ref Range   Glucose-Capillary 164 (H) 70 - 99 mg/dL    Comment: Glucose reference range applies only to samples taken after fasting for at least 8 hours.  Glucose, capillary     Status: Abnormal   Collection Time: 09/29/22 11:42 AM  Result Value Ref Range   Glucose-Capillary 197 (H) 70 - 99 mg/dL    Comment: Glucose reference range applies only to samples taken after fasting for at least 8 hours.  Glucose, capillary     Status: Abnormal    Collection Time: 09/29/22  4:25 PM  Result Value Ref Range   Glucose-Capillary 161 (H) 70 - 99 mg/dL    Comment: Glucose reference range applies only to samples taken after fasting for at least 8 hours.  Glucose, capillary     Status: Abnormal   Collection Time: 09/29/22  9:23 PM  Result Value Ref Range   Glucose-Capillary 172 (H) 70 - 99 mg/dL    Comment: Glucose reference range applies only to samples taken after fasting for at least 8 hours.  CBC     Status: Abnormal   Collection Time: 09/30/22  6:10 AM  Result Value Ref Range   WBC 9.5 4.0 - 10.5 K/uL   RBC 2.81 (L) 3.87 - 5.11 MIL/uL   Hemoglobin 8.0 (L) 12.0 - 15.0 g/dL   HCT 29.5 (L) 62.1 - 30.8 %   MCV 92.2 80.0 - 100.0 fL   MCH 28.5 26.0 - 34.0 pg   MCHC 30.9 30.0 - 36.0 g/dL   RDW 65.7 84.6 - 96.2 %   Platelets 499 (H) 150 - 400 K/uL   nRBC 0.0 0.0 - 0.2 %    Comment: Performed at [facility], [address] [place], [place] [address]  Basic metabolic panel     Status: Abnormal   Collection Time: 09/30/22  6:10 AM  Result Value Ref Range   Sodium 136 135 - 145 mmol/L   Potassium 4.3 3.5 - 5.1 mmol/L   Chloride 100 98 - 111 mmol/L   CO2 30 22 - 32 mmol/L   Glucose, Bld 171 (H) 70 - 99 mg/dL    Comment: Glucose reference range applies only to samples taken after fasting for at least 8 hours.   BUN 30 (H) 6 - 20 mg/dL   Creatinine, Ser 1.32 0.44 - 1.00 mg/dL   Calcium 8.9 8.9 - 44.0 mg/dL   GFR, Estimated >10 >27 mL/min    Comment: (NOTE)  Calculated using the CKD-EPI Creatinine Equation (2021)    Anion gap 6 5 - 15    Comment: Performed at [facility], [address] [place], [place] [address]  Glucose, capillary     Status: Abnormal   Collection Time: 09/30/22  8:28 AM  Result Value Ref Range   Glucose-Capillary 152 (H) 70 - 99 mg/dL    Comment: Glucose reference range applies only to samples taken after fasting for at least 8 hours.  Glucose, capillary     Status: Abnormal   Collection  Time: 09/30/22 11:22 AM  Result Value Ref Range   Glucose-Capillary 144 (H) 70 - 99 mg/dL    Comment: Glucose reference range applies only to samples taken after fasting for at least 8 hours.  Lab report - scanned     Status: None   Collection Time: 10/07/22 12:00 AM  Result Value Ref Range   EGFR 102.0     Comment: Abstracted by HIM  Aerobic culture     Status: Abnormal   Collection Time: 10/16/22 10:50 AM  Result Value Ref Range   Aerobic Bacterial Culture Final report (A)    Organism ID, Bacteria Serratia marcescens (A)     Comment: Heavy growth   Antimicrobial Susceptibility Comment     Comment:       ** S = Susceptible; I = Intermediate; R = Resistant **                    P = Positive; N = Negative             MICS are expressed in micrograms per mL    Antibiotic                 RSLT#1    RSLT#2    RSLT#3    RSLT#4 Amoxicillin/Clavulanic Acid    R Cefazolin                      R Cefepime                       S Ceftriaxone                    S Cefuroxime                     R Ciprofloxacin                  S Ertapenem                      S Gentamicin                     S Levofloxacin                   S Meropenem                      S Tetracycline                   R Tobramycin                     S Trimethoprim/Sulfa             S   Lactic acid, plasma     Status: None   Collection Time: 10/22/22  3:26 PM  Result Value Ref Range  Lactic Acid, Venous 1.0 0.5 - 1.9 mmol/L    Comment: Performed at [facility], [address] [place], [place] [address]  Comprehensive metabolic panel     Status: Abnormal   Collection Time: 10/22/22  3:26 PM  Result Value Ref Range   Sodium 133 (L) 135 - 145 mmol/L   Potassium 5.1 3.5 - 5.1 mmol/L   Chloride 97 (L) 98 - 111 mmol/L   CO2 24 22 - 32 mmol/L   Glucose, Bld 125 (H) 70 - 99 mg/dL    Comment: Glucose reference range applies only to samples taken after fasting for at least 8 hours.   BUN 54 (H) 6 - 20 mg/dL    Creatinine, Ser 2.44 (H) 0.44 - 1.00 mg/dL   Calcium 8.8 (L) 8.9 - 10.3 mg/dL   Total Protein 8.3 (H) 6.5 - 8.1 g/dL   Albumin 3.0 (L) 3.5 - 5.0 g/dL   AST 14 (L) 15 - 41 U/L   ALT 7 0 - 44 U/L   Alkaline Phosphatase 56 38 - 126 U/L   Total Bilirubin 0.6 0.3 - 1.2 mg/dL   GFR, Estimated 53 (L) >60 mL/min    Comment: (NOTE) Calculated using the CKD-EPI Creatinine Equation (2021)    Anion gap 12 5 - 15    Comment: Performed at [facility], [address] [place], [place] [address]  CBC with Differential     Status: Abnormal   Collection Time: 10/22/22  3:26 PM  Result Value Ref Range   WBC 11.7 (H) 4.0 - 10.5 K/uL   RBC 3.44 (L) 3.87 - 5.11 MIL/uL   Hemoglobin 9.8 (L) 12.0 - 15.0 g/dL   HCT 25.3 (L) 66.4 - 40.3 %   MCV 91.3 80.0 - 100.0 fL   MCH 28.5 26.0 - 34.0 pg   MCHC 31.2 30.0 - 36.0 g/dL   RDW 47.4 25.9 - 56.3 %   Platelets 552 (H) 150 - 400 K/uL   nRBC 0.0 0.0 - 0.2 %   Neutrophils Relative % 74 %   Neutro Abs 8.7 (H) 1.7 - 7.7 K/uL   Lymphocytes Relative 15 %   Lymphs Abs 1.7 0.7 - 4.0 K/uL   Monocytes Relative 7 %   Monocytes Absolute 0.9 0.1 - 1.0 K/uL   Eosinophils Relative 2 %   Eosinophils Absolute 0.2 0.0 - 0.5 K/uL   Basophils Relative 1 %   Basophils Absolute 0.1 0.0 - 0.1 K/uL   Immature Granulocytes 1 %   Abs Immature Granulocytes 0.07 0.00 - 0.07 K/uL    Comment: Performed at [facility], [address] [place], [place] [address]  Urinalysis, w/ Reflex to Culture (Infection Suspected) -Urine, Clean Catch     Status: Abnormal   Collection Time: 10/22/22  3:26 PM  Result Value Ref Range   Specimen Source URINE, CLEAN CATCH    Color, Urine YELLOW (A) YELLOW   APPearance HAZY (A) CLEAR   Specific Gravity, Urine 1.026 1.005 - 1.030   pH 5.0 5.0 - 8.0   Glucose, UA NEGATIVE NEGATIVE mg/dL   Hgb urine dipstick SMALL (A) NEGATIVE   Bilirubin Urine NEGATIVE NEGATIVE   Ketones, ur NEGATIVE NEGATIVE mg/dL   Protein, ur NEGATIVE  NEGATIVE mg/dL   Nitrite NEGATIVE NEGATIVE   Leukocytes,Ua NEGATIVE NEGATIVE   RBC / HPF 6-10 0 - 5 RBC/hpf   WBC, UA 6-10 0 - 5 WBC/hpf    Comment:        Reflex urine  culture not performed if WBC <=10, OR if Squamous epithelial cells >5. If Squamous epithelial cells >5 suggest recollection.    Bacteria, UA NONE SEEN NONE SEEN   Squamous Epithelial / HPF 0-5 0 - 5 /HPF   Mucus PRESENT    Hyaline Casts, UA PRESENT     Comment: Performed at [facility], [address] [place], [place] [address]  Procalcitonin     Status: None   Collection Time: 10/22/22  3:26 PM  Result Value Ref Range   Procalcitonin 0.12 ng/mL    Comment:        Interpretation: PCT (Procalcitonin) <= 0.5 ng/mL: Systemic infection (sepsis) is not likely. Local bacterial infection is possible. (NOTE)       Sepsis PCT Algorithm           Lower Respiratory Tract                                      Infection PCT Algorithm    ----------------------------     ----------------------------         PCT < 0.25 ng/mL                PCT < 0.10 ng/mL          Strongly encourage             Strongly discourage   discontinuation of antibiotics    initiation of antibiotics    ----------------------------     -----------------------------       PCT 0.25 - 0.50 ng/mL            PCT 0.10 - 0.25 ng/mL               OR       >80% decrease in PCT            Discourage initiation of                                            antibiotics      Encourage discontinuation           of antibiotics    ----------------------------     -----------------------------         PCT >= 0.50 ng/mL              PCT 0.26 - 0.50 ng/mL               AND        <80% decrease in PCT             Encourage initiation of                                             antibiotics       Encourage continuation           of antibiotics    ----------------------------     -----------------------------        PCT >= 0.50 ng/mL                   PCT > 0.50 ng/mL               AND  increase in PCT                  Strongly encourage                                      initiation of antibiotics    Strongly encourage escalation           of antibiotics                                     -----------------------------                                           PCT <= 0.25 ng/mL                                                 OR                                        > 80% decrease in PCT                                      Discontinue / Do not initiate                                             antibiotics  Performed at [facility], [address] [place], [place] [address]   Lipase, blood     Status: None   Collection Time: 10/22/22  4:03 PM  Result Value Ref Range   Lipase 23 11 - 51 U/L    Comment: Performed at [facility], [address] [place], [place] [address]  Resp panel by RT-PCR (RSV, Flu A&B, Covid) Anterior Nasal Swab     Status: None   Collection Time: 10/22/22  4:03 PM   Specimen: Anterior Nasal Swab  Result Value Ref Range   SARS Coronavirus 2 by RT PCR NEGATIVE NEGATIVE    Comment: (NOTE) SARS-CoV-2 target nucleic acids are NOT DETECTED.  The SARS-CoV-2 RNA is generally detectable in upper respiratory specimens during the acute phase of infection. The lowest concentration of SARS-CoV-2 viral copies this assay can detect is 138 copies/mL. A negative result does not preclude SARS-Cov-2 infection and should not be used as the sole basis for treatment or other patient management decisions. A negative result may occur with  improper specimen collection/handling, submission of specimen other than nasopharyngeal swab, presence of viral mutation(s) within the areas targeted by this assay, and inadequate number of viral copies(<138 copies/mL). A negative result must be combined with clinical observations, patient history, and epidemiological information. The expected result is  Negative.  Fact Sheet for Patients:  BloggerCourse.com  Fact Sheet for Healthcare Providers:  SeriousBroker.it  This test is no t yet approved or cleared by the [place] FDA and  has been authorized for detection and/or diagnosis of SARS-CoV-2 by FDA under an Emergency Use Authorization (EUA). This EUA will remain  in effect (meaning this test can be used) for the duration of the COVID-19 declaration under Section 564(b)(1) of the Act, 21 U.S.C.section 360bbb-3(b)(1), unless the authorization is terminated  or revoked sooner.       Influenza A by PCR NEGATIVE NEGATIVE   Influenza B by PCR NEGATIVE NEGATIVE    Comment: (NOTE) The Xpert Xpress SARS-CoV-2/FLU/RSV plus assay is intended as an aid in the diagnosis of influenza from Nasopharyngeal swab specimens and should not be used as a sole basis for treatment. Nasal washings and aspirates are unacceptable for Xpert Xpress SARS-CoV-2/FLU/RSV testing.  Fact Sheet for Patients: [employer]gerCourse.com  Fact Sheet for Healthcare Providers: SeriousBroker.it  This test is not yet approved or cleared by the [place] FDA and has been authorized for detection and/or diagnosis of SARS-CoV-2 by FDA under an Emergency Use Authorization (EUA). This EUA will remain in effect (meaning this test can be used) for the duration of the COVID-19 declaration under Section 564(b)(1) of the Act, 21 U.S.C. section 360bbb-3(b)(1), unless the authorization is terminated or revoked.     Resp Syncytial Virus by PCR NEGATIVE NEGATIVE    Comment: (NOTE) Fact Sheet for Patients: BloggerCourse.com  Fact Sheet for Healthcare Providers: SeriousBroker.it  This test is not yet approved or cleared by the [place] FDA and has been authorized for detection and/or diagnosis of SARS-CoV-2 by FDA under an  Emergency Use Authorization (EUA). This EUA will remain in effect (meaning this test can be used) for the duration of the COVID-19 declaration under Section 564(b)(1) of the Act, 21 U.S.C. section 360bbb-3(b)(1), unless the authorization is terminated or revoked.  Performed at [facility], [address] [place], [place] [address]   Glucose, capillary     Status: None   Collection Time: 10/22/22  9:49 PM  Result Value Ref Range   Glucose-Capillary 92 70 - 99 mg/dL    Comment: Glucose reference range applies only to samples taken after fasting for at least 8 hours.  Cytology - Non PAP;     Status: None   Collection Time: 10/23/22 12:00 AM  Result Value Ref Range   CYTOLOGY - NON GYN      CYTOLOGY - NON PAP [facility] [address] [place], [place] [address] Telephone [phone] or [phone] Fax [phone]  CYTOPATHOLOGY REPORT   Accession #: [id number] Patient Name: KENYA, NITTI Visit # : [id number]  MRN: [id number] Physician: Lolita Patella DOB/Age 08-28-1965 (Age: 66) Gender: F Collected Date: 10/23/2022 Received Date: 10/23/2022  FINAL DIAGNOSIS STATEMENT Of SPECIMEN ADEQUACY:  INTERPRETATION(S):      - MALIGNANT CELLS PRESENT      - SEE NOTE   Diagnosis Note : The patient's clinical history and recent CT findings of large volume ascites and omental caking concerning for peritoneal carcinomatosis are noted.  The peritoneal fluid is positive for malignant cells. Immunohistochemical stains (CK7, CK20, PAX8, ER, WT1, GATA3, TTF-1 and CDX2) to further characterize the tumor cells are pending and will be reported in an addendum.  Dr. Georgeann Oppenheim was notified via secure chat on 10/25/2022.    CORRECTED [id number]: Immunohistochemistry Addendum:  The sparse cellularity makes the accurate evaluation of the immunohistochemical stains problematic.  Additional tissue sampling is recommended to further  classify the tumor cells.  Clinical and radiological correlation recommended.  DATE SIGNED OUT:  10/25/2022 ELECTRONIC SIGNATURE : Corey Harold M.D., Dossie Arbour., Pathologist, Electronic Signature   CASE COMMENTS   CLINICAL HISTORY  SOURCE OF SPECIMEN(S) Peritoneal/Ascitic Fluid  SPECIMEN COMMENTS: SPECIMEN CLINICAL INFORMATION:    Gross Description Specimen: Received is/are of cloudy light brown fluid and one prestained slide      Prepared:      # Smears: 1      # Concentration Technique Slides (i.e. ThinPrep): 1      # Cell Block: 1      # Diff-Quick Stain: 0        Report signed out from the following location(s) [facility] [address], [place] [address] CLIA #: [id number]  [facility] [address] [place], [place] [address] CLIA #: [id number]   Glucose, capillary     Status: Abnormal   Collection Time: 10/23/22  4:08 AM  Result Value Ref Range   Glucose-Capillary 110 (H) 70 - 99 mg/dL    Comment: Glucose reference range applies only to samples taken after fasting for at least 8 hours.  Comprehensive metabolic panel     Status: Abnormal   Collection Time: 10/23/22  5:10 AM  Result Value Ref Range   Sodium 132 (L) 135 - 145 mmol/L   Potassium 4.8 3.5 - 5.1 mmol/L   Chloride 99 98 - 111 mmol/L   CO2 24 22 - 32 mmol/L   Glucose, Bld 127 (H) 70 - 99 mg/dL    Comment: Glucose reference range applies only to samples taken after fasting for at least 8 hours.   BUN 48 (H) 6 - 20 mg/dL   Creatinine, Ser 0.10 (H) 0.44 - 1.00 mg/dL   Calcium 8.3 (L) 8.9 - 10.3 mg/dL   Total Protein 7.3 6.5 - 8.1 g/dL   Albumin 2.6 (L) 3.5 - 5.0 g/dL   AST 13 (L) 15 - 41 U/L   ALT 7 0 - 44 U/L   Alkaline Phosphatase 53 38 - 126 U/L   Total Bilirubin 0.5 0.3 - 1.2 mg/dL   GFR, Estimated 57 (L) >60 mL/min    Comment: (NOTE) Calculated using the CKD-EPI Creatinine Equation (2021)    Anion gap 9 5 - 15    Comment: Performed at  [facility], [address] [place], [place] [address]  CBC     Status: Abnormal   Collection Time: 10/23/22  5:10 AM  Result Value Ref Range   WBC 9.7 4.0 - 10.5 K/uL   RBC 3.22 (L) 3.87 - 5.11 MIL/uL   Hemoglobin 9.2 (L) 12.0 - 15.0 g/dL   HCT 66.4 (L) 40.3 - 47.4 %   MCV 90.4 80.0 - 100.0 fL   MCH 28.6 26.0 - 34.0 pg   MCHC 31.6 30.0 - 36.0 g/dL   RDW 25.9 56.3 - 87.5 %   Platelets 537 (H) 150 - 400 K/uL   nRBC 0.0 0.0 - 0.2 %    Comment: Performed at [facility], [address] [place], [place] [address]  Brain natriuretic peptide     Status: None   Collection Time: 10/23/22  5:10 AM  Result Value Ref Range   B Natriuretic Peptide 42.8 0.0 - 100.0 pg/mL    Comment: Performed at [facility], [address] [place], [place] [address]  Glucose, capillary     Status: Abnormal   Collection Time: 10/23/22  7:38 AM  Result Value Ref Range   Glucose-Capillary 140 (H) 70 -  99 mg/dL    Comment: Glucose reference range applies only to samples taken after fasting for at least 8 hours.  Glucose, capillary     Status: Abnormal   Collection Time: 10/23/22 11:08 AM  Result Value Ref Range   Glucose-Capillary 66 (L) 70 - 99 mg/dL    Comment: Glucose reference range applies only to samples taken after fasting for at least 8 hours.  Glucose, capillary     Status: Abnormal   Collection Time: 10/23/22 11:35 AM  Result Value Ref Range   Glucose-Capillary 63 (L) 70 - 99 mg/dL    Comment: Glucose reference range applies only to samples taken after fasting for at least 8 hours.  Glucose, capillary     Status: None   Collection Time: 10/23/22 12:01 PM  Result Value Ref Range   Glucose-Capillary 77 70 - 99 mg/dL    Comment: Glucose reference range applies only to samples taken after fasting for at least 8 hours.  Body fluid cell count with differential     Status: Abnormal   Collection Time: 10/23/22  2:20 PM  Result Value Ref Range   Fluid Type-FCT CYTO PERI     Color, Fluid YELLOW (A) YELLOW   Appearance, Fluid CLOUDY (A) CLEAR   Total Nucleated Cell Count, Fluid 5,696 cu mm   Neutrophil Count, Fluid 29 %   Lymphs, Fluid 46 %   Monocyte-Macrophage-Serous Fluid 25 %   Eos, Fluid 0 %    Comment: Performed at [facility], [address] [place], [place] [address]  Albumin, pleural or peritoneal fluid      Status: None   Collection Time: 10/23/22  2:20 PM  Result Value Ref Range   Albumin, Fluid 2.3 g/dL    Comment: (NOTE) No normal range established for this test Results should be evaluated in conjunction with serum values    Fluid Type-FALB CYTO PERI     Comment: Performed at [facility], [address] [place], [place] [address]  Lipase, Fluid     Status: None   Collection Time: 10/23/22  2:20 PM  Result Value Ref Range   Lipase-Fluid 8 U/L    Comment: (NOTE) INTERPRETIVE INFORMATION: Lipase, Fluid For information on body fluid reference ranges and/or interpretive guidance visit MetroFlorists.tn This test was developed and its performance characteristics determined by [facility]. It has not been cleared or approved by the [place] Food and Drug Administration. This test was performed in a CLIA certified laboratory and is intended for clinical purposes. Performed At: [facility] [address] [place], [place] [id number] Evans Lance MDPhD [id number]    Source of Sample PERITONEAL     Comment: Performed at [facility], [address] [place], [place] [address]  Body fluid culture w Gram Stain     Status: None   Collection Time: 10/23/22  2:20 PM   Specimen: PATH Cytology Peritoneal fluid  Result Value Ref Range   Specimen Description      PERITONEAL Performed at [facility], [address] [place], [place] [address]    Special Requests      PERITONEAL Performed at [facility], [address] [place], [place] [address]    Gram  Stain      WBC PRESENT,BOTH PMN AND MONONUCLEAR NO ORGANISMS SEEN CYTOSPIN SMEAR    Culture      NO GROWTH 3 DAYS Performed at [facility], [address], [place], [place] [address]  Report Status 10/27/2022 FINAL   Glucose, capillary     Status: Abnormal   Collection Time: 10/23/22  3:43 PM  Result Value Ref Range   Glucose-Capillary 124 (H) 70 - 99 mg/dL    Comment: Glucose reference range applies only to samples taken after fasting for at least 8 hours.  Glucose, capillary     Status: Abnormal   Collection Time: 10/23/22  7:26 PM  Result Value Ref Range   Glucose-Capillary 59 (L) 70 - 99 mg/dL    Comment: Glucose reference range applies only to samples taken after fasting for at least 8 hours.  Glucose, capillary     Status: None   Collection Time: 10/23/22  8:36 PM  Result Value Ref Range   Glucose-Capillary 71 70 - 99 mg/dL    Comment: Glucose reference range applies only to samples taken after fasting for at least 8 hours.  Glucose, capillary     Status: Abnormal   Collection Time: 10/23/22 11:30 PM  Result Value Ref Range   Glucose-Capillary 103 (H) 70 - 99 mg/dL    Comment: Glucose reference range applies only to samples taken after fasting for at least 8 hours.  Glucose, capillary     Status: Abnormal   Collection Time: 10/24/22  4:09 AM  Result Value Ref Range   Glucose-Capillary 120 (H) 70 - 99 mg/dL    Comment: Glucose reference range applies only to samples taken after fasting for at least 8 hours.  Glucose, capillary     Status: Abnormal   Collection Time: 10/24/22  8:18 AM  Result Value Ref Range   Glucose-Capillary 120 (H) 70 - 99 mg/dL    Comment: Glucose reference range applies only to samples taken after fasting for at least 8 hours.   Comment 1 Notify RN    Comment 2 Document in Chart   Glucose, capillary     Status: Abnormal   Collection Time: 10/24/22 11:32 AM  Result Value Ref Range   Glucose-Capillary 141 (H) 70 - 99 mg/dL    Comment:  Glucose reference range applies only to samples taken after fasting for at least 8 hours.   Comment 1 Notify RN    Comment 2 Document in Chart   Glucose, capillary     Status: Abnormal   Collection Time: 10/24/22  3:28 PM  Result Value Ref Range   Glucose-Capillary 137 (H) 70 - 99 mg/dL    Comment: Glucose reference range applies only to samples taken after fasting for at least 8 hours.   Comment 1 Notify RN    Comment 2 Document in Chart   Glucose, capillary     Status: Abnormal   Collection Time: 10/24/22  7:21 PM  Result Value Ref Range   Glucose-Capillary 134 (H) 70 - 99 mg/dL    Comment: Glucose reference range applies only to samples taken after fasting for at least 8 hours.  Glucose, capillary     Status: Abnormal   Collection Time: 10/24/22 11:14 PM  Result Value Ref Range   Glucose-Capillary 133 (H) 70 - 99 mg/dL    Comment: Glucose reference range applies only to samples taken after fasting for at least 8 hours.  Glucose, capillary     Status: Abnormal   Collection Time: 10/25/22  3:33 AM  Result Value Ref Range   Glucose-Capillary 122 (H) 70 - 99 mg/dL    Comment: Glucose reference range applies only to samples taken after fasting for at least 8 hours.   Comment  1 Notify RN   Glucose, capillary     Status: Abnormal   Collection Time: 10/25/22  8:14 AM  Result Value Ref Range   Glucose-Capillary 125 (H) 70 - 99 mg/dL    Comment: Glucose reference range applies only to samples taken after fasting for at least 8 hours.   Comment 1 Notify RN    Comment 2 Document in Chart   Glucose, capillary     Status: Abnormal   Collection Time: 10/25/22 11:42 AM  Result Value Ref Range   Glucose-Capillary 182 (H) 70 - 99 mg/dL    Comment: Glucose reference range applies only to samples taken after fasting for at least 8 hours.   Comment 1 Notify RN    Comment 2 Document in Chart   Glucose, capillary     Status: Abnormal   Collection Time: 10/25/22  4:15 PM  Result Value Ref Range    Glucose-Capillary 140 (H) 70 - 99 mg/dL    Comment: Glucose reference range applies only to samples taken after fasting for at least 8 hours.   Comment 1 Notify RN    Comment 2 Document in Chart   Glucose, capillary     Status: Abnormal   Collection Time: 10/25/22  8:42 PM  Result Value Ref Range   Glucose-Capillary 152 (H) 70 - 99 mg/dL    Comment: Glucose reference range applies only to samples taken after fasting for at least 8 hours.  Glucose, capillary     Status: Abnormal   Collection Time: 10/26/22 12:34 AM  Result Value Ref Range   Glucose-Capillary 116 (H) 70 - 99 mg/dL    Comment: Glucose reference range applies only to samples taken after fasting for at least 8 hours.  Glucose, capillary     Status: Abnormal   Collection Time: 10/26/22  7:59 AM  Result Value Ref Range   Glucose-Capillary 127 (H) 70 - 99 mg/dL    Comment: Glucose reference range applies only to samples taken after fasting for at least 8 hours.   Comment 1 Notify RN    Comment 2 Document in Chart   CBC with Differential/Platelet     Status: Abnormal   Collection Time: 10/26/22  8:07 AM  Result Value Ref Range   WBC 10.2 4.0 - 10.5 K/uL   RBC 3.59 (L) 3.87 - 5.11 MIL/uL   Hemoglobin 10.2 (L) 12.0 - 15.0 g/dL   HCT 95.2 (L) 84.1 - 32.4 %   MCV 90.5 80.0 - 100.0 fL   MCH 28.4 26.0 - 34.0 pg   MCHC 31.4 30.0 - 36.0 g/dL   RDW 40.1 02.7 - 25.3 %   Platelets 689 (H) 150 - 400 K/uL   nRBC 0.0 0.0 - 0.2 %   Neutrophils Relative % 61 %   Neutro Abs 6.3 1.7 - 7.7 K/uL   Lymphocytes Relative 24 %   Lymphs Abs 2.5 0.7 - 4.0 K/uL   Monocytes Relative 8 %   Monocytes Absolute 0.8 0.1 - 1.0 K/uL   Eosinophils Relative 4 %   Eosinophils Absolute 0.4 0.0 - 0.5 K/uL   Basophils Relative 2 %   Basophils Absolute 0.2 (H) 0.0 - 0.1 K/uL   Immature Granulocytes 1 %   Abs Immature Granulocytes 0.05 0.00 - 0.07 K/uL    Comment: Performed at [facility], [address] [place], [place] [address]  Basic  metabolic panel     Status: Abnormal   Collection Time: 10/26/22  8:07 AM  Result  Value Ref Range   Sodium 134 (L) 135 - 145 mmol/L   Potassium 5.5 (H) 3.5 - 5.1 mmol/L   Chloride 100 98 - 111 mmol/L   CO2 27 22 - 32 mmol/L   Glucose, Bld 150 (H) 70 - 99 mg/dL    Comment: Glucose reference range applies only to samples taken after fasting for at least 8 hours.   BUN 34 (H) 6 - 20 mg/dL   Creatinine, Ser 1.61 0.44 - 1.00 mg/dL   Calcium 8.9 8.9 - 09.6 mg/dL   GFR, Estimated >04 >54 mL/min    Comment: (NOTE) Calculated using the CKD-EPI Creatinine Equation (2021)    Anion gap 7 5 - 15    Comment: Performed at [facility], [address] [place], [place] [address]  CA 19-9 (SERIAL)     Status: None   Collection Time: 10/26/22  8:07 AM  Result Value Ref Range   CA 19-9 5 0 - 35 U/mL    Comment: (NOTE) Roche [employer]ostics Electrochemiluminescence Immunoassay (ECLIA) Values obtained with different assay methods or kits cannot be used interchangeably.  Results cannot be interpreted as absolute evidence of the presence or absence of malignant disease. Performed At: [facility] [address] [place], [place] [id number] Jolene Schimke MD [id number]   CA 125     Status: Abnormal   Collection Time: 10/26/22  8:07 AM  Result Value Ref Range   Cancer Antigen (CA) 125 762.0 (H) 0.0 - 38.1 U/mL    Comment: (NOTE) Roche [employer]ostics Electrochemiluminescence Immunoassay (ECLIA) Values obtained with different assay methods or kits cannot be used interchangeably.  Results cannot be interpreted as absolute evidence of the presence or absence of malignant disease. Performed At: [facility] [address] [place], [place] [id number] Jolene Schimke MD KG:[id number]   CEA     Status: None   Collection Time: 10/26/22  8:07 AM  Result Value Ref Range   CEA <0.6 0.0 - 4.7 ng/mL    Comment: (NOTE)                             Nonsmokers          <3.9                              Smokers             <5.6 [employer] Electrochemiluminescence Immunoassay (ECLIA) Values obtained with different assay methods or kits cannot be used interchangeably.  Results cannot be interpreted as absolute evidence of the presence or absence of malignant disease. Performed At: [facility] [address] [place], [place] [id number] Jolene Schimke MD [id number]   Glucose, capillary     Status: Abnormal   Collection Time: 10/26/22 12:01 PM  Result Value Ref Range   Glucose-Capillary 153 (H) 70 - 99 mg/dL    Comment: Glucose reference range applies only to samples taken after fasting for at least 8 hours.   Comment 1 Notify RN    Comment 2 Document in Chart   Glucose, capillary     Status: Abnormal   Collection Time: 10/26/22  4:50 PM  Result Value Ref Range   Glucose-Capillary 158 (H) 70 - 99 mg/dL    Comment: Glucose reference range applies only to samples taken after fasting for at least 8 hours.   Comment 1 Notify RN    Comment 2  Document in Chart   Glucose, capillary     Status: Abnormal   Collection Time: 10/26/22  8:20 PM  Result Value Ref Range   Glucose-Capillary 167 (H) 70 - 99 mg/dL    Comment: Glucose reference range applies only to samples taken after fasting for at least 8 hours.  Glucose, capillary     Status: Abnormal   Collection Time: 10/26/22  8:53 PM  Result Value Ref Range   Glucose-Capillary 142 (H) 70 - 99 mg/dL    Comment: Glucose reference range applies only to samples taken after fasting for at least 8 hours.  Glucose, capillary     Status: Abnormal   Collection Time: 10/26/22 11:56 PM  Result Value Ref Range   Glucose-Capillary 147 (H) 70 - 99 mg/dL    Comment: Glucose reference range applies only to samples taken after fasting for at least 8 hours.  Glucose, capillary     Status: Abnormal   Collection Time: 10/27/22  3:32 AM  Result Value Ref Range   Glucose-Capillary 136 (H) 70 - 99 mg/dL    Comment: Glucose  reference range applies only to samples taken after fasting for at least 8 hours.  Glucose, capillary     Status: Abnormal   Collection Time: 10/27/22  7:39 AM  Result Value Ref Range   Glucose-Capillary 139 (H) 70 - 99 mg/dL    Comment: Glucose reference range applies only to samples taken after fasting for at least 8 hours.  Glucose, capillary     Status: Abnormal   Collection Time: 10/27/22 11:16 AM  Result Value Ref Range   Glucose-Capillary 178 (H) 70 - 99 mg/dL    Comment: Glucose reference range applies only to samples taken after fasting for at least 8 hours.   Comment 1 Notify RN    Comment 2 Document in Chart   Glucose, capillary     Status: Abnormal   Collection Time: 10/27/22  4:24 PM  Result Value Ref Range   Glucose-Capillary 146 (H) 70 - 99 mg/dL    Comment: Glucose reference range applies only to samples taken after fasting for at least 8 hours.   Comment 1 Notify RN    Comment 2 Document in Chart   Glucose, capillary     Status: Abnormal   Collection Time: 10/27/22  7:31 PM  Result Value Ref Range   Glucose-Capillary 178 (H) 70 - 99 mg/dL    Comment: Glucose reference range applies only to samples taken after fasting for at least 8 hours.  Glucose, capillary     Status: Abnormal   Collection Time: 10/27/22 11:56 PM  Result Value Ref Range   Glucose-Capillary 131 (H) 70 - 99 mg/dL    Comment: Glucose reference range applies only to samples taken after fasting for at least 8 hours.  Glucose, capillary     Status: Abnormal   Collection Time: 10/28/22  4:28 AM  Result Value Ref Range   Glucose-Capillary 157 (H) 70 - 99 mg/dL    Comment: Glucose reference range applies only to samples taken after fasting for at least 8 hours.  Glucose, capillary     Status: Abnormal   Collection Time: 10/28/22  7:38 AM  Result Value Ref Range   Glucose-Capillary 152 (H) 70 - 99 mg/dL    Comment: Glucose reference range applies only to samples taken after fasting for at least 8  hours.   Comment 1 Notify RN    Comment 2 Document in Chart   Glucose,  capillary     Status: Abnormal   Collection Time: 10/28/22 11:39 AM  Result Value Ref Range   Glucose-Capillary 158 (H) 70 - 99 mg/dL    Comment: Glucose reference range applies only to samples taken after fasting for at least 8 hours.  Glucose, capillary     Status: Abnormal   Collection Time: 10/28/22  3:35 PM  Result Value Ref Range   Glucose-Capillary 137 (H) 70 - 99 mg/dL    Comment: Glucose reference range applies only to samples taken after fasting for at least 8 hours.   Comment 1 Notify RN    Comment 2 Document in Chart   Glucose, capillary     Status: Abnormal   Collection Time: 10/28/22  8:17 PM  Result Value Ref Range   Glucose-Capillary 164 (H) 70 - 99 mg/dL    Comment: Glucose reference range applies only to samples taken after fasting for at least 8 hours.  Glucose, capillary     Status: Abnormal   Collection Time: 10/28/22 11:49 PM  Result Value Ref Range   Glucose-Capillary 155 (H) 70 - 99 mg/dL    Comment: Glucose reference range applies only to samples taken after fasting for at least 8 hours.  Glucose, capillary     Status: Abnormal   Collection Time: 10/29/22  4:06 AM  Result Value Ref Range   Glucose-Capillary 131 (H) 70 - 99 mg/dL    Comment: Glucose reference range applies only to samples taken after fasting for at least 8 hours.  CBC with Differential/Platelet     Status: Abnormal   Collection Time: 10/29/22  4:52 AM  Result Value Ref Range   WBC 9.4 4.0 - 10.5 K/uL   RBC 3.51 (L) 3.87 - 5.11 MIL/uL   Hemoglobin 10.0 (L) 12.0 - 15.0 g/dL   HCT 78.2 (L) 95.6 - 21.3 %   MCV 91.7 80.0 - 100.0 fL   MCH 28.5 26.0 - 34.0 pg   MCHC 31.1 30.0 - 36.0 g/dL   RDW 08.6 57.8 - 46.9 %   Platelets 671 (H) 150 - 400 K/uL   nRBC 0.0 0.0 - 0.2 %   Neutrophils Relative % 56 %   Neutro Abs 5.2 1.7 - 7.7 K/uL   Lymphocytes Relative 29 %   Lymphs Abs 2.7 0.7 - 4.0 K/uL   Monocytes Relative 8 %    Monocytes Absolute 0.8 0.1 - 1.0 K/uL   Eosinophils Relative 5 %   Eosinophils Absolute 0.5 0.0 - 0.5 K/uL   Basophils Relative 1 %   Basophils Absolute 0.1 0.0 - 0.1 K/uL   Immature Granulocytes 1 %   Abs Immature Granulocytes 0.10 (H) 0.00 - 0.07 K/uL    Comment: Performed at [facility], [address], [place], [place] [address]  Basic metabolic panel     Status: Abnormal   Collection Time: 10/29/22  4:52 AM  Result Value Ref Range   Sodium 133 (L) 135 - 145 mmol/L   Potassium 4.8 3.5 - 5.1 mmol/L   Chloride 98 98 - 111 mmol/L   CO2 26 22 - 32 mmol/L   Glucose, Bld 147 (H) 70 - 99 mg/dL    Comment: Glucose reference range applies only to samples taken after fasting for at least 8 hours.   BUN 33 (H) 6 - 20 mg/dL   Creatinine, Ser 8.41 0.44 - 1.00 mg/dL   Calcium 8.7 (L) 8.9 - 10.3 mg/dL   GFR, Estimated >32 >44 mL/min  Comment: (NOTE) Calculated using the CKD-EPI Creatinine Equation (2021)    Anion gap 9 5 - 15    Comment: Performed at [facility], [address] [place], [place] [address]  Glucose, capillary     Status: Abnormal   Collection Time: 10/29/22  8:39 AM  Result Value Ref Range   Glucose-Capillary 178 (H) 70 - 99 mg/dL    Comment: Glucose reference range applies only to samples taken after fasting for at least 8 hours.   Comment 1 Notify RN    Comment 2 Document in Chart   Glucose, capillary     Status: Abnormal   Collection Time: 10/29/22 11:47 AM  Result Value Ref Range   Glucose-Capillary 166 (H) 70 - 99 mg/dL    Comment: Glucose reference range applies only to samples taken after fasting for at least 8 hours.   Comment 1 Notify RN    Comment 2 Document in Chart   Glucose, capillary     Status: Abnormal   Collection Time: 10/29/22  4:39 PM  Result Value Ref Range   Glucose-Capillary 145 (H) 70 - 99 mg/dL    Comment: Glucose reference range applies only to samples taken after fasting for at least 8 hours.  Glucose, capillary      Status: Abnormal   Collection Time: 10/29/22  6:44 PM  Result Value Ref Range   Glucose-Capillary 161 (H) 70 - 99 mg/dL    Comment: Glucose reference range applies only to samples taken after fasting for at least 8 hours.   Comment 1 Notify RN    Comment 2 Document in Chart   Glucose, capillary     Status: Abnormal   Collection Time: 10/29/22  8:38 PM  Result Value Ref Range   Glucose-Capillary 157 (H) 70 - 99 mg/dL    Comment: Glucose reference range applies only to samples taken after fasting for at least 8 hours.  Glucose, capillary     Status: Abnormal   Collection Time: 10/30/22  1:37 AM  Result Value Ref Range   Glucose-Capillary 138 (H) 70 - 99 mg/dL    Comment: Glucose reference range applies only to samples taken after fasting for at least 8 hours.   Comment 1 QC Due   Glucose, capillary     Status: Abnormal   Collection Time: 10/30/22  4:13 AM  Result Value Ref Range   Glucose-Capillary 131 (H) 70 - 99 mg/dL    Comment: Glucose reference range applies only to samples taken after fasting for at least 8 hours.  Phosphorus     Status: None   Collection Time: 10/30/22  4:54 AM  Result Value Ref Range   Phosphorus 4.1 2.5 - 4.6 mg/dL    Comment: Performed at [facility], [address] [place], [place] [address]  Magnesium     Status: None   Collection Time: 10/30/22  4:54 AM  Result Value Ref Range   Magnesium 1.7 1.7 - 2.4 mg/dL    Comment: Performed at [facility], [address] [place], [place] [address]  Glucose, capillary     Status: Abnormal   Collection Time: 10/30/22  8:48 AM  Result Value Ref Range   Glucose-Capillary 137 (H) 70 - 99 mg/dL    Comment: Glucose reference range applies only to samples taken after fasting for at least 8 hours.  Glucose, capillary     Status: Abnormal   Collection Time: 10/30/22 11:37 AM  Result Value Ref Range   Glucose-Capillary  148 (H) 70 - 99 mg/dL    Comment: Glucose reference range applies only to  samples taken after fasting for at least 8 hours.  POCT CBG (Fasting - Glucose)     Status: Abnormal   Collection Time: 11/12/22  1:02 PM  Result Value Ref Range   Glucose Fasting, POC 113 (A) 70 - 99 mg/dL  POCT XPERT XPRESS SARS COVID-2/FLU/RSV     Status: None   Collection Time: 11/12/22  2:51 PM  Result Value Ref Range   SARS Coronavirus 2 neg    FLU A neg    FLU B neg    RSV RNA, PCR neg   Surgical pathology     Status: None   Collection Time: 11/18/22 12:00 AM  Result Value Ref Range   SURGICAL PATHOLOGY      SURGICAL PATHOLOGY [facility] [address], [address] [place], [place] [address] Telephone [phone] or [phone] Fax [phone]  REPORT OF SURGICAL PATHOLOGY   Accession #: [id number] Patient Name: AMILLIA, SORIA Visit # : [id number]  MRN: [id number] Physician: Olive Bass DOB/Age [birth year] (Age: 63) Gender: F Collected Date: 11/18/2022 Received Date: 11/18/2022  FINAL DIAGNOSIS       1. 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       Diagnosis Note : The carcinoma is positive for cytokeratin 7, PAX8, WT1, and ER.      Cytokeratin 20 and GATA3 are negative.  P53 is overexpressed.  The morphologic      findings in conjunction with the pattern of immunohistochemistry support the      above diagnosis.  Stain controls worked appropriately.  Dr. Cathie Hoops was notified on      11/20/2022. This case underwent intradepartmental consultation and Dr. [place]      concurs with the in terpretation.      There is sufficient material for ancillary molecular testing (block 1A).      DATE SIGNED OUT: 11/20/2022 ELECTRONIC SIGNATURE : Oneita Kras Md, Delice Bison , Pathologist, Electronic Signature  MICROSCOPIC DESCRIPTION  CASE COMMENTS STAINS USED IN DIAGNOSIS: H&E H&E H&E Universal Negative Control-DAB Stains used in diagnosis 1 CK-7, 1 CK 20, 1 ER - NOACIS, 1 GATA-3, 1 PAX 8,  1 Wilms' Tumor -1, 1 P53 Some of these immunohistochemical stains may have been developed and the performance characteristics determined by [facility].  Some may not have been cleared or approved by the U.S. Food and Drug Administration.  The FDA has determined that such clearance or approval is not necessary.  This test is used for clinical purposes.  It should not be regarded as investigational or for research.  This laboratory is certified under the Clinical Laboratory Improvement Amendments of 1988 (CLIA-88) as qualified to perform high complexity clinical labora tory testing. IHC stains are performed on formalin fixed,paraffin embedded tissue using a 3,3"diaminobenzidine (DAB) chromogen and Leica Bond Autostainer System. The staining intensity of the nucleus is score manually and is reported as the percentage of tumor cell nuclei demonstrating specific nuclear staining. The specimens are fixed in 10% Neutral Formalin for at least 6 hours and up to 72hrs. These tests are validated on decalcified tissue. Results should be interpreted with caution given the possibility of false negative results on decalcified specimens. Antibody Clones are as follows ER-clone 46F, PR-clone 16, Ki67- clone MM1.Some of these immunohistochemical stains may have been developed and the performance characteristics determined by [facility]  Pathology.    CLINICAL HISTORY  SPECIMEN(S) OBTAINED 1. Omentum, biopsy,  SPECIMEN COMMENTS: 1. Core needle biopsy 18ga x 4 passes SPECIMEN CLINICAL INFORMATION:    Gross Description 1. Received in formalin, l abeled with patient name only, are 4 gray soft tissue cores (1.0-1.8 cm in length) submitted entirely in blocks 1A-1C.      SMB      11/18/2022        Report signed out from the following location(s) [place] [address], [place] [address] CLIA #: [id number]  [facility] [address] [place], [place] [address] CLIA #: [id number]   Glucose, capillary     Status: Abnormal   Collection Time: 11/18/22  9:17 AM  Result Value Ref Range   Glucose-Capillary 108 (H) 70 - 99 mg/dL    Comment: Glucose reference range applies only to samples taken after fasting for at least 8 hours.  CBC upon arrival     Status: Abnormal   Collection Time: 11/18/22  9:19 AM  Result Value Ref Range   WBC 11.0 (H) 4.0 - 10.5 K/uL   RBC 4.16 3.87 - 5.11 MIL/uL   Hemoglobin 11.7 (L) 12.0 - 15.0 g/dL   HCT 40.1 02.7 - 25.3 %   MCV 89.9 80.0 - 100.0 fL   MCH 28.1 26.0 - 34.0 pg   MCHC 31.3 30.0 - 36.0 g/dL   RDW 66.4 40.3 - 47.4 %   Platelets 504 (H) 150 - 400 K/uL   nRBC 0.0 0.0 - 0.2 %    Comment: Performed at [facility], [address] [place], [place] [address]  Protime-INR upon arrival     Status: None   Collection Time: 11/18/22  9:19 AM  Result Value Ref Range   Prothrombin Time 15.0 11.4 - 15.2 seconds   INR 1.2 0.8 - 1.2    Comment: (NOTE) INR goal varies based on device and disease states. Performed at [facility], [address] [place], [place] [address]   Retic Panel     Status: Abnormal   Collection Time: 11/21/22  8:09 AM  Result Value Ref Range   Retic Ct Pct 1.4 0.4 - 3.1 %   RBC. 3.87 3.87 - 5.11 MIL/uL   Retic Count, Absolute 54.6 19.0 - 186.0 K/uL   Immature Retic Fract 18.8 (H) 2.3 - 15.9 %   Reticulocyte Hemoglobin 28.4 >27.9 pg    Comment:           If this patient has chronic kidney disease and does not have a hemoglobinopathy a RET-He < 29 pg meets criteria for iron deficiency per the 2016 NICE guidelines.    Refer to specific guidelines to determine the appropriate thresholds for treating CKD- associated iron deficiency. TSAT and ferritin should be used in patients with hemoglobinopathies (e.g. thalassemia). Performed at [facility], [address] [place], [place] [address]   CA 125     Status: Abnormal    Collection Time: 11/21/22  8:09 AM  Result Value Ref Range   Cancer Antigen (CA) 125 944.0 (H) 0.0 - 38.1 U/mL    Comment: (NOTE) Roche [employer]ostics Electrochemiluminescence Immunoassay (ECLIA) Values obtained with different assay methods or kits cannot be used interchangeably.  Results cannot be interpreted as absolute evidence of the presence or absence of malignant disease. Performed At: [facility] [address] [place], [place] [id number] Jolene Schimke MD YT:[id number]   [facility] only)  Status: Abnormal   Collection Time: 11/21/22  8:09 AM  Result Value Ref Range   Sodium 136 135 - 145 mmol/L   Potassium 4.6 3.5 - 5.1 mmol/L   Chloride 103 98 - 111 mmol/L   CO2 24 22 - 32 mmol/L   Glucose, Bld 121 (H) 70 - 99 mg/dL    Comment: Glucose reference range applies only to samples taken after fasting for at least 8 hours.   BUN 25 (H) 6 - 20 mg/dL   Creatinine 5.28 (H) 4.13 - 1.00 mg/dL   Calcium 8.8 (L) 8.9 - 10.3 mg/dL   Total Protein 7.4 6.5 - 8.1 g/dL   Albumin 3.0 (L) 3.5 - 5.0 g/dL   AST 16 15 - 41 U/L   ALT 12 0 - 44 U/L   Alkaline Phosphatase 43 38 - 126 U/L   Total Bilirubin 0.2 <1.2 mg/dL   GFR, Estimated >24 >40 mL/min    Comment: (NOTE) Calculated using the CKD-EPI Creatinine Equation (2021)    Anion gap 9 5 - 15    Comment: Performed at [facility], [address] [place], [place] [address]  CBC with Differential (Cancer Center Only)     Status: Abnormal   Collection Time: 11/21/22  8:09 AM  Result Value Ref Range   WBC Count 9.9 4.0 - 10.5 K/uL   RBC 3.86 (L) 3.87 - 5.11 MIL/uL   Hemoglobin 10.8 (L) 12.0 - 15.0 g/dL   HCT 53.6 (L) 64.4 - 03.4 %   MCV 89.9 80.0 - 100.0 fL   MCH 28.0 26.0 - 34.0 pg   MCHC 31.1 30.0 - 36.0 g/dL   RDW 74.2 59.5 - 63.8 %   Platelet Count 431 (H) 150 - 400 K/uL   nRBC 0.0 0.0 - 0.2 %   Neutrophils Relative % 58 %   Neutro Abs 5.9 1.7 - 7.7 K/uL   Lymphocytes Relative 25 %   Lymphs Abs 2.5 0.7 -  4.0 K/uL   Monocytes Relative 8 %   Monocytes Absolute 0.7 0.1 - 1.0 K/uL   Eosinophils Relative 7 %   Eosinophils Absolute 0.7 (H) 0.0 - 0.5 K/uL   Basophils Relative 1 %   Basophils Absolute 0.1 0.0 - 0.1 K/uL   Immature Granulocytes 1 %   Abs Immature Granulocytes 0.05 0.00 - 0.07 K/uL    Comment: Performed at [facility], [address] [place], [place] [address]  CBC with Differential (Cancer Center Only)     Status: Abnormal   Collection Time: 11/28/22 12:59 PM  Result Value Ref Range   WBC Count 5.3 4.0 - 10.5 K/uL   RBC 3.55 (L) 3.87 - 5.11 MIL/uL   Hemoglobin 10.0 (L) 12.0 - 15.0 g/dL   HCT 32.9 (L) 51.8 - 84.1 %   MCV 89.6 80.0 - 100.0 fL   MCH 28.2 26.0 - 34.0 pg   MCHC 31.4 30.0 - 36.0 g/dL   RDW 66.0 63.0 - 16.0 %   Platelet Count 347 150 - 400 K/uL   nRBC 0.0 0.0 - 0.2 %   Neutrophils Relative % 46 %   Neutro Abs 2.5 1.7 - 7.7 K/uL   Lymphocytes Relative 37 %   Lymphs Abs 2.0 0.7 - 4.0 K/uL   Monocytes Relative 6 %   Monocytes Absolute 0.3 0.1 - 1.0 K/uL   Eosinophils Relative 9 %   Eosinophils Absolute 0.5 0.0 - 0.5 K/uL   Basophils Relative 1 %   Basophils Absolute  0.1 0.0 - 0.1 K/uL   Immature Granulocytes 1 %   Abs Immature Granulocytes 0.03 0.00 - 0.07 K/uL    Comment: Performed at [facility], [address] [place], [place] [address]  CMP (Cancer Center only)     Status: Abnormal   Collection Time: 11/28/22 12:59 PM  Result Value Ref Range   Sodium 138 135 - 145 mmol/L   Potassium 4.7 3.5 - 5.1 mmol/L   Chloride 101 98 - 111 mmol/L   CO2 26 22 - 32 mmol/L   Glucose, Bld 123 (H) 70 - 99 mg/dL    Comment: Glucose reference range applies only to samples taken after fasting for at least 8 hours.   BUN 25 (H) 6 - 20 mg/dL   Creatinine 0.10 2.72 - 1.00 mg/dL   Calcium 9.1 8.9 - 53.6 mg/dL   Total Protein 7.2 6.5 - 8.1 g/dL   Albumin 3.2 (L) 3.5 - 5.0 g/dL   AST 22 15 - 41 U/L   ALT 14 0 - 44 U/L   Alkaline Phosphatase 40 38 - 126  U/L   Total Bilirubin 0.4 <1.2 mg/dL   GFR, Estimated >64 >40 mL/min    Comment: (NOTE) Calculated using the CKD-EPI Creatinine Equation (2021)    Anion gap 11 5 - 15    Comment: Performed at [facility], [address] [place], [place] [address]  CMP (Cancer Center only)     Status: Abnormal   Collection Time: 12/13/22  8:08 AM  Result Value Ref Range   Sodium 137 135 - 145 mmol/L   Potassium 4.0 3.5 - 5.1 mmol/L   Chloride 101 98 - 111 mmol/L   CO2 27 22 - 32 mmol/L   Glucose, Bld 136 (H) 70 - 99 mg/dL    Comment: Glucose reference range applies only to samples taken after fasting for at least 8 hours.   BUN 21 (H) 6 - 20 mg/dL   Creatinine 5.95 6.38 - 1.00 mg/dL   Calcium 9.2 8.9 - 75.6 mg/dL   Total Protein 7.5 6.5 - 8.1 g/dL   Albumin 3.3 (L) 3.5 - 5.0 g/dL   AST 15 15 - 41 U/L   ALT 15 0 - 44 U/L   Alkaline Phosphatase 45 38 - 126 U/L   Total Bilirubin 0.2 <1.2 mg/dL   GFR, Estimated >43 >32 mL/min    Comment: (NOTE) Calculated using the CKD-EPI Creatinine Equation (2021)    Anion gap 9 5 - 15    Comment: Performed at [facility], [address] [place], [place] [address]  CBC with Differential (Cancer Center Only)     Status: Abnormal   Collection Time: 12/13/22  8:08 AM  Result Value Ref Range   WBC Count 7.1 4.0 - 10.5 K/uL   RBC 3.43 (L) 3.87 - 5.11 MIL/uL   Hemoglobin 9.7 (L) 12.0 - 15.0 g/dL   HCT 41.6 (L) 60.6 - 30.1 %   MCV 88.6 80.0 - 100.0 fL   MCH 28.3 26.0 - 34.0 pg   MCHC 31.9 30.0 - 36.0 g/dL   RDW 60.1 09.3 - 23.5 %   Platelet Count 391 150 - 400 K/uL   nRBC 0.0 0.0 - 0.2 %   Neutrophils Relative % 58 %   Neutro Abs 4.1 1.7 - 7.7 K/uL   Lymphocytes Relative 30 %   Lymphs Abs 2.1 0.7 - 4.0 K/uL   Monocytes Relative 8 %   Monocytes Absolute 0.6 0.1 -  1.0 K/uL   Eosinophils Relative 2 %   Eosinophils Absolute 0.2 0.0 - 0.5 K/uL   Basophils Relative 1 %   Basophils Absolute 0.1 0.0 - 0.1 K/uL   Immature Granulocytes 1 %   Abs  Immature Granulocytes 0.04 0.00 - 0.07 K/uL    Comment: Performed at [facility], [address] [place], [place] [address]  CA 125     Status: Abnormal   Collection Time: 12/13/22  8:08 AM  Result Value Ref Range   Cancer Antigen (CA) 125 332.0 (H) 0.0 - 38.1 U/mL    Comment: (NOTE) [employer] Electrochemiluminescence Immunoassay (ECLIA) Values obtained with different assay methods or kits cannot be used interchangeably.  Results cannot be interpreted as absolute evidence of the presence or absence of malignant disease. Performed At: [facility] [address] [place], [place] [id number] Jolene Schimke MD [id number]        Assessment & Plan:   Problem List Items Addressed This Visit       Active Problems   Diabetes mellitus, type 2 (HCC) - Primary (Chronic)   Relevant Orders   POCT CBG (Fasting - Glucose) (Completed)   Other Visit Diagnoses       Exposure to confirmed case of COVID-19       Relevant Orders   POCT XPERT XPRESS SARS COVID-2/FLU/RSV (Completed)       Return for F/U.   Total time spent: 20 minutes  Miki Kins, FNP  11/12/2022   This document may have been prepared by [facility] Voice Recognition software and as such may include unintentional dictation errors.

## 2022-12-30 DIAGNOSIS — S88112A Complete traumatic amputation at level between knee and ankle, left lower leg, initial encounter: Secondary | ICD-10-CM | POA: Diagnosis not present

## 2022-12-30 NOTE — Telephone Encounter (Signed)
Patient called again asking about migraine medicine. Isn't sure what she can take. Please adv

## 2023-01-02 MED FILL — Fosaprepitant Dimeglumine For IV Infusion 150 MG (Base Eq): INTRAVENOUS | Qty: 5 | Status: AC

## 2023-01-03 ENCOUNTER — Inpatient Hospital Stay: Payer: 59

## 2023-01-03 ENCOUNTER — Encounter: Payer: Self-pay | Admitting: Internal Medicine

## 2023-01-03 ENCOUNTER — Telehealth: Payer: Self-pay

## 2023-01-03 ENCOUNTER — Inpatient Hospital Stay (HOSPITAL_BASED_OUTPATIENT_CLINIC_OR_DEPARTMENT_OTHER): Payer: 59 | Admitting: Internal Medicine

## 2023-01-03 VITALS — BP 188/77 | HR 73 | Resp 16

## 2023-01-03 VITALS — BP 163/76 | HR 73 | Temp 98.1°F | Resp 18 | Wt 250.8 lb

## 2023-01-03 DIAGNOSIS — C482 Malignant neoplasm of peritoneum, unspecified: Secondary | ICD-10-CM

## 2023-01-03 DIAGNOSIS — R180 Malignant ascites: Secondary | ICD-10-CM | POA: Diagnosis not present

## 2023-01-03 DIAGNOSIS — D509 Iron deficiency anemia, unspecified: Secondary | ICD-10-CM | POA: Diagnosis not present

## 2023-01-03 DIAGNOSIS — Z5111 Encounter for antineoplastic chemotherapy: Secondary | ICD-10-CM | POA: Diagnosis not present

## 2023-01-03 LAB — CMP (CANCER CENTER ONLY)
ALT: 18 U/L (ref 0–44)
AST: 16 U/L (ref 15–41)
Albumin: 3.4 g/dL — ABNORMAL LOW (ref 3.5–5.0)
Alkaline Phosphatase: 41 U/L (ref 38–126)
Anion gap: 9 (ref 5–15)
BUN: 24 mg/dL — ABNORMAL HIGH (ref 6–20)
CO2: 27 mmol/L (ref 22–32)
Calcium: 9.1 mg/dL (ref 8.9–10.3)
Chloride: 100 mmol/L (ref 98–111)
Creatinine: 0.75 mg/dL (ref 0.44–1.00)
GFR, Estimated: >60 mL/min (ref >60)
Glucose, Bld: 305 mg/dL — ABNORMAL HIGH (ref 70–99)
Potassium: 4.4 mmol/L (ref 3.5–5.1)
Sodium: 136 mmol/L (ref 135–145)
Total Bilirubin: 0.3 mg/dL (ref <1.2)
Total Protein: 7.5 g/dL (ref 6.5–8.1)

## 2023-01-03 LAB — IRON AND TIBC
Iron: 152 ug/dL (ref 28–170)
Saturation Ratios: 45 % — ABNORMAL HIGH (ref 10.4–31.8)
TIBC: 340 ug/dL (ref 250–450)
UIBC: 188 ug/dL

## 2023-01-03 LAB — CBC WITH DIFFERENTIAL (CANCER CENTER ONLY)
Abs Immature Granulocytes: 0.02 10*3/uL (ref 0.00–0.07)
Basophils Absolute: 0 10*3/uL (ref 0.0–0.1)
Basophils Relative: 1 %
Eosinophils Absolute: 0.1 10*3/uL (ref 0.0–0.5)
Eosinophils Relative: 2 %
HCT: 30.7 % — ABNORMAL LOW (ref 36.0–46.0)
Hemoglobin: 9.8 g/dL — ABNORMAL LOW (ref 12.0–15.0)
Immature Granulocytes: 0 %
Lymphocytes Relative: 41 %
Lymphs Abs: 2 10*3/uL (ref 0.7–4.0)
MCH: 28.2 pg (ref 26.0–34.0)
MCHC: 31.9 g/dL (ref 30.0–36.0)
MCV: 88.2 fL (ref 80.0–100.0)
Monocytes Absolute: 0.5 10*3/uL (ref 0.1–1.0)
Monocytes Relative: 9 %
Neutro Abs: 2.3 10*3/uL (ref 1.7–7.7)
Neutrophils Relative %: 47 %
Platelet Count: 176 10*3/uL (ref 150–400)
RBC: 3.48 MIL/uL — ABNORMAL LOW (ref 3.87–5.11)
RDW: 16.7 % — ABNORMAL HIGH (ref 11.5–15.5)
WBC Count: 4.9 10*3/uL (ref 4.0–10.5)
nRBC: 0 % (ref 0.0–0.2)

## 2023-01-03 LAB — FERRITIN: Ferritin: 99 ng/mL (ref 11–307)

## 2023-01-03 MED ORDER — PALONOSETRON HCL INJECTION 0.25 MG/5ML
0.2500 mg | Freq: Once | INTRAVENOUS | Status: AC
Start: 2023-01-03 — End: 2023-01-03
  Administered 2023-01-03: 0.25 mg via INTRAVENOUS
  Filled 2023-01-03: qty 5

## 2023-01-03 MED ORDER — SODIUM CHLORIDE 0.9 % IV SOLN
INTRAVENOUS | Status: DC
  Filled 2023-01-03: qty 250

## 2023-01-03 MED ORDER — PACLITAXEL CHEMO INJECTION 300 MG/50ML
175.0000 mg/m2 | Freq: Once | INTRAVENOUS | Status: AC
  Administered 2023-01-03: 360 mg via INTRAVENOUS
  Filled 2023-01-03: qty 60

## 2023-01-03 MED ORDER — SODIUM CHLORIDE 0.9 % IV SOLN
750.0000 mg | Freq: Once | INTRAVENOUS | Status: AC
  Administered 2023-01-03: 750 mg via INTRAVENOUS
  Filled 2023-01-03: qty 75

## 2023-01-03 MED ORDER — DEXAMETHASONE SODIUM PHOSPHATE 10 MG/ML IJ SOLN
10.0000 mg | Freq: Once | INTRAMUSCULAR | Status: AC
Start: 2023-01-03 — End: 2023-01-03
  Administered 2023-01-03: 10 mg via INTRAVENOUS
  Filled 2023-01-03: qty 1

## 2023-01-03 MED ORDER — DIPHENHYDRAMINE HCL 50 MG/ML IJ SOLN
50.0000 mg | Freq: Once | INTRAMUSCULAR | Status: AC
  Administered 2023-01-03: 50 mg via INTRAVENOUS
  Filled 2023-01-03: qty 1

## 2023-01-03 MED ORDER — HEPARIN SOD (PORK) LOCK FLUSH 100 UNIT/ML IV SOLN
500.0000 [IU] | Freq: Once | INTRAVENOUS | Status: AC | PRN
  Administered 2023-01-03: 500 [IU]
  Filled 2023-01-03: qty 5

## 2023-01-03 MED ORDER — SODIUM CHLORIDE 0.9 % IV SOLN
150.0000 mg | Freq: Once | INTRAVENOUS | Status: AC
  Administered 2023-01-03: 150 mg via INTRAVENOUS
  Filled 2023-01-03: qty 150

## 2023-01-03 MED ORDER — FAMOTIDINE IN NACL 20-0.9 MG/50ML-% IV SOLN
20.0000 mg | Freq: Once | INTRAVENOUS | Status: AC
  Administered 2023-01-03: 20 mg via INTRAVENOUS
  Filled 2023-01-03: qty 50

## 2023-01-03 NOTE — Progress Notes (Signed)
Hematology/Oncology Progress note [phone] [phone]      CHIEF COMPLAINTS  Primary peritoneal carcinomatosis  ASSESSMENT & PLAN:  Primary peritoneal carcinomatosis (HCC) Malignant ascites. CA125 is significantly elevated in 700s.  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  Myriad HRD positive.  BRCA1/BRCA2 negative. Labs reviewed and acceptable for treatment.  Will proceed with cycle 3 of carboplatin and Taxol.  She is scheduled for CT scans on January 6.   Khorana score 3  Recommend to start Eliquis 2.5mg  BID, continue Aspirin 81mg  daily.    Encounter for antineoplastic chemotherapy Chemotherapy treatment as planned.  continue oral iron supplementation.    Neoplasm related pain She has tried both tramadol and oxycodone, which did not help her pain.  Follow up with palliative care service for further management. - now on hydromorphone 2 mg every 4 hours as needed    Protein calorie malnutrition (HCC) Follow up with nutritionist.    Blurring of vision Could be due to chemotherapy, however she may also have underlying conditions.  She was advised to consult an ophthalmologist for further evaluation.   Chemotherapy-induced nausea We discussed about instructions of antiemetics.  She can use Compazine as instructed if Zofran only partially relieves her symptoms.    RTC in 3 weeks as scheduled with Dr. Cathie Hoops, labs, cycle 4 CarboTaxol to discuss scans.  All questions were answered. The patient knows to call the clinic with any problems, questions or concerns.   HISTORY OF PRESENTING ILLNESS:  Nancy [name] 57 y.o. female presents to establish care for  peritoneal carcinomatosis I have reviewed her chart and materials related to her cancer extensively and collaborated history with the patient. Summary of oncologic history is as follows: Oncology History   Primary peritoneal carcinomatosis (HCC)  11/02/2022 Imaging   Large volume ascites in the abdomen or pelvis with omental caking.Appearance is concerning for peritoneal malignancy/carcinomatosis,often seen with ovarian cancer, but no ovarian mass visualized. Aortic atherosclerosis    11/06/2022 Initial Diagnosis   Primary peritoneal carcinomatosis   -presented to emergency room for evaluation of abdominal pain/bloating, poor oral intake, nonbloody nonbilious vomiting  Patient underwent paracentesis, cytology showed malignant cells. IHC can not be added.    11/14/2022 Imaging   CT chest w contrast  1. Prominent subcentimeter right retrocrural lymph nodes, metastatic disease can not be excluded. 2. Nonspecific ground-glass nodule of the superior portion of the right lower lobe measuring 7 mm. Recommend attention on follow-up. 3. Small left pleural effusion. 4. Partially visualized large volume abdominal ascites and peritoneal thickening, volume of ascites appears increased when compared with the prior CT. 5. Coronary artery calcifications and aortic Atherosclerosis (ICD10-I70.0).   11/18/2022 Procedure   Medi port placement by IR   11/18/2022 Procedure   Omentum biopsy showed  1. Omentum, biopsy,  :   - INVOLVEMENT BY HIGH-GRADE SEROUS CARCINOMA OF GYNECOLOGIC ORIGIN.   Diagnosis Note : The carcinoma is positive for cytokeratin 7, PAX8, WT1, and ER.  Cytokeratin 20 and GATA3 are negative.  P53 is overexpressed.  The morphologic findings in conjunction with the pattern of immunohistochemistry support the  above diagnosis.   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 The report date is 11/25/2022.  The  CancerNext-Expanded + RNAinsight gene panel offered by [employer] and  includes sequencing and rearrangement analysis for the following 71 genes: AIP, ALK, APC*, ATM*, AXIN2, BAP1, BARD1, BMPR1A, BRCA1*, BRCA2*, BRIP1*, CDC73, CDH1*,CDK4, CDKN1B, CDKN2A, CHEK2*, CTNNA1, DICER1, FH, FLCN, KIF1B, LZTR1, MAX, MEN1, MET, MLH1*, MSH2*, MSH3, MSH6*, MUTYH*, NF1*, NF2, NTHL1, PALB2*, PHOX2B, PMS2*, POT1, PRKAR1A, PTCH1, PTEN*, RAD51C*, RAD51D*,RB1, RET, SDHA, SDHAF2, SDHB, SDHC, SDHD, SMAD4, SMARCA4, SMARCB1, SMARCE1, STK11, SUFU, TMEM127, TP53*,TSC1, TSC2, VHL; EGFR, EGLN1, HOXB13, KIT, MITF, PDGFRA, POLD1 and POLE (sequencing only); EPCAM and GREM1 (deletion/duplication only).      Interval history Patient was seen today prior to cycle 3 of CarboTaxol. Overall tolerating treatment well.  Has mild nausea which is manageable with antiemetics.  Energy level is fair.  Energy fluctuates.  Reports mild neuropathy in her bilateral fingertips which is new.  Has chronic neuropathy in her right lower extremity which is unchanged.   MEDICAL HISTORY:  Past Medical History:  Diagnosis Date   Abnormal cardiovascular stress test 09/21/2018   Formatting of this note might be different from the original. Lexiscan Myoview 09/16/2018 revealed mild anterior ischemia   Adverse effect of motion 05/10/2014   AKI (acute kidney injury) (HCC) 10/22/2022   Anxiety    Arthritis    r knee   Asthma    Breast cyst 05/10/2014   CAD (coronary artery disease)    a.) LHC 06/04/2009 at [facility]; non-obstructive CAD. b.) CTA with FFR 10/08/2018: extensive mixed plaque proximal to mid LAD (51-69%); Coronary Ca score 217; FFR 0.71 dPDA, 0.86 mLAD, 0.87 dLCx.   CCF (congestive cardiac failure) (HCC) 06/06/2008   a.) 30% EF. b.) TTE 06/03/2011: EF >55%; triv MR. c.) TTE 04/27/2018: EF 55%; mild LVH; triv PR, mild MR/TR; G1DD.   Cellulitis of left lower extremity 09/18/2022   Chest pain with high risk for cardiac etiology 07/02/2016   Chronic use of opiate drug for therapeutic purpose 02/13/2022   Formatting of  this note might be different from the original. [facility] Pain Contract signed on 04/18/16 & updated 03/04/17; UDS done on 04/18/16.   Complication of anesthesia    Diabetic foot infection (HCC) 03/13/2015   Diabetic foot ulcer (HCC) 02/08/2021   Diabetic ulcer of left heel associated with type 2 diabetes mellitus (HCC) 02/08/2021   Eczema    Family history of adverse reaction to anesthesia    a.) PONV in mother and grandmother   Gas gangrene (HCC) 09/18/2022   GERD (gastroesophageal reflux disease) 05/10/2014   History of kidney stones    History of MSSA bacteremia due to osteomyelitis L foot 09/18/22 10/22/2022   HLD (hyperlipidemia)    Hyponatremia 03/13/2015   Migraines    Motion sickness    MSSA bacteremia 09/20/2022   Osteomyelitis (HCC) 09/18/2022   Other acute osteomyelitis, left ankle and foot (HCC) 02/13/2022   Panic attacks    Peripheral edema 04/27/2018   Pneumonia    PONV (postoperative nausea and vomiting)    Sepsis secondary to diabetic foot infection 03/13/2015   Sepsis secondary to diabetic foot infection 03/13/2015   T2DM (type 2 diabetes mellitus) (HCC)    Takotsubo cardiomyopathy / transient apical balooning syndrome / stress-induced cardiomyopathy 05/10/2014   Unspecified essential hypertension     SURGICAL HISTORY: Past Surgical History:  Procedure Laterality Date   AMPUTATION Left 09/19/2022   Procedure: AMPUTATION BELOW KNEE;  Surgeon: Renford Dills, MD;  Location: [facility];  Service: Vascular;  Laterality: Left;   AMPUTATION Left 09/24/2022  Procedure: AMPUTATION BELOW KNEE WITH WOUND CLOSURE;  Surgeon: Renford Dills, MD;  Location: [facility];  Service: Vascular;  Laterality: Left;   AMPUTATION TOE Left 03/16/2015   Procedure: left fifth toe amputation with incision and drainage;  Surgeon: Gwyneth Revels, DPM;  Location: [facility];  Service: Podiatry;  Laterality: Left;   APPLICATION OF WOUND VAC Left 02/14/2021   Procedure: APPLICATION OF WOUND VAC;   Surgeon: Gwyneth Revels, DPM;  Location: [facility];  Service: Podiatry;  Laterality: Left;   APPLICATION OF WOUND VAC Left 04/16/2021   Procedure: APPLICATION OF WOUND VAC;  Surgeon: Peggye Form, DO;  Location: [facility];  Service: Plastics;  Laterality: Left;   CARDIAC CATHETERIZATION Left 06/04/2009   Procedure: CARDIAC CATHETERIZATION; Location: [facility]   CHOLECYSTECTOMY     DEBRIDEMENT AND CLOSURE WOUND Left 04/16/2021   Procedure: DEBRIDEMENT AND CLOSURE WOUND;  Surgeon: Peggye Form, DO;  Location:  SURGERY CENTER;  Service: Plastics;  Laterality: Left;  1 hour   IR IMAGING GUIDED PORT INSERTION  11/18/2022   IRRIGATION AND DEBRIDEMENT FOOT Left 02/08/2021   Procedure: IRRIGATION AND DEBRIDEMENT FOOT - LFT HEEL ULCER;  Surgeon: Gwyneth Revels, DPM;  Location: [facility];  Service: Podiatry;  Laterality: Left;   IRRIGATION AND DEBRIDEMENT FOOT Left 02/14/2021   Procedure: IRRIGATION AND DEBRIDEMENT LEFT HEEL;  Surgeon: Gwyneth Revels, DPM;  Location: [facility];  Service: Podiatry;  Laterality: Left;   kidney stone removal     KNEE ARTHROSCOPY W/ MENISCAL REPAIR     NOSE SURGERY  01/07/1989   due to fracture   TEE WITHOUT CARDIOVERSION N/A 09/26/2022   Procedure: TRANSESOPHAGEAL ECHOCARDIOGRAM;  Surgeon: Clotilde Dieter, DO;  Location: [facility];  Service: Cardiovascular;  Laterality: N/A;   TOE AMPUTATION     second toe   TONSILLECTOMY     TOTAL KNEE ARTHROPLASTY  01/11/2011   Procedure: TOTAL KNEE ARTHROPLASTY;  Surgeon: Harvie Junior;  Location: [facility];  Service: Orthopedics;  Laterality: Right;  COMPUTER ASSISTED TOTAL KNEE REPLACEMENT    SOCIAL HISTORY: Social History   Socioeconomic History   Marital status: Married    Spouse name: Not on file   Number of children: Not on file   Years of education: Not on file   Highest education level: Not on file  Occupational History   Not on file  Tobacco Use   Smoking status: Former     Current packs/day: 0.00    Types: Cigarettes    Quit date: 03/12/1985    Years since quitting: 37.8   Smokeless tobacco: Never  Vaping Use   Vaping status: Never Used  Substance and Sexual Activity   Alcohol use: No   Drug use: No   Sexual activity: Yes    Birth control/protection: Post-menopausal  Other Topics Concern   Not on file  Social History Narrative   Wife, [name], at bedside. One indoor pet, dog.   Social Drivers of Corporate investment banker Strain: Not on file  Food Insecurity: No Food Insecurity (10/22/2022)   Hunger Vital Sign    Worried About Running Out of Food in the Last Year: Never true    Ran Out of Food in the Last Year: Never true  Transportation Needs: No Transportation Needs (10/22/2022)   PRAPARE - Administrator, Civil Service (Medical): No    Lack of Transportation (Non-Medical): No  Physical Activity: Not on file  Stress: Not on file  Social Connections:  Not on file  Intimate Partner Violence: Not At Risk (10/22/2022)   Humiliation, Afraid, Rape, and Kick questionnaire    Fear of Current or Ex-Partner: No    Emotionally Abused: No    Physically Abused: No    Sexually Abused: No    FAMILY HISTORY: Family History  Problem Relation Age of Onset   Transient ischemic attack Mother    Asthma Mother    Hypertension Mother    Lung cancer Father 4   Hypertension Father    Asthma Sister    Anxiety disorder Sister    Asthma Sister    Hypertension Brother    Ovarian cancer Maternal Aunt 28   Melanoma Maternal Uncle        dx 64s   Lung cancer Paternal Aunt 5   Stomach cancer Maternal Grandmother    Colon cancer Maternal Grandmother    Lymphoma Maternal Grandmother    Melanoma Maternal Grandfather 72       sun exposure   Lung cancer Paternal Grandmother 48   Kidney cancer Paternal Grandfather        dx early   Crohn's disease Other    GER disease Other    Breast cancer Neg Hx     ALLERGIES:  is allergic to shellfish  allergy, codeine, morphine and codeine, rosuvastatin, percocet [oxycodone-acetaminophen], sulfa antibiotics, and theophyllines.  MEDICATIONS:  Current Outpatient Medications  Medication Sig Dispense Refill   acidophilus (RISAQUAD) CAPS capsule Take 1 capsule by mouth daily.     albuterol (PROVENTIL HFA;VENTOLIN HFA) 108 (90 Base) MCG/ACT inhaler Inhale 2 puffs into the lungs every 4 (four) hours as needed for wheezing or shortness of breath. Reported on 03/29/2015 1 Inhaler 3   albuterol (PROVENTIL) (2.5 MG/3ML) 0.083% nebulizer solution Take 2.5 mg by nebulization as needed.     ALPRAZolam (XANAX) 0.5 MG tablet TAKE 1/2 TO 1 TABLET BY MOUTH AS NEEDED FOR PANIC ATTACKS 30 tablet 3   apixaban (ELIQUIS) 2.5 MG TABS tablet Take 1 tablet (2.5 mg total) by mouth 2 (two) times daily. 60 tablet 2   ascorbic acid (VITAMIN C) 500 MG tablet Take 1 tablet (500 mg total) by mouth 2 (two) times daily. 60 tablet 0   aspirin EC 81 MG tablet Take 81 mg by mouth daily.     carvedilol (COREG) 6.25 MG tablet TAKE 1 TABLET BY MOUTH TWICE A DAY 180 tablet 1   cholecalciferol (VITAMIN D3) 25 MCG (1000 UNIT) tablet Take 1,000 Units by mouth daily.     Continuous Glucose Sensor (FREESTYLE LIBRE 2 SENSOR) MISC 1 each by Does not apply route every 14 (fourteen) days. 2 each 3   Cyanocobalamin (VITAMIN B 12) 500 MCG TABS Take by mouth daily at 8 pm.     desvenlafaxine (PRISTIQ) 100 MG 24 hr tablet TAKE 1 TABLET BY MOUTH EVERY DAY 90 tablet 2   dexamethasone (DECADRON) 4 MG tablet Take 2 tablets (8mg ) by mouth daily starting the day after carboplatin for 2 days. Take with food 30 tablet 1   dicyclomine (BENTYL) 10 MG capsule Take 1 capsule (10 mg total) by mouth 4 (four) times daily -  before meals and at bedtime. 60 capsule 1   Ensure Max Protein (ENSURE MAX PROTEIN) LIQD Take 330 mLs (11 oz total) by mouth 2 (two) times daily.     fenofibrate 54 MG tablet TAKE 1 TABLET BY MOUTH EVERY DAY 90 tablet 3   fluconazole  (DIFLUCAN) 200 MG tablet Take 1 tablet (200  mg total) by mouth daily. 30 tablet 3   gabapentin (NEURONTIN) 300 MG capsule Take 1 capsule (300 mg total) by mouth at bedtime as needed. 30 capsule 3   HYDROmorphone (DILAUDID) 2 MG tablet Take 1 tablet (2 mg total) by mouth every 4 (four) hours as needed for severe pain (pain score 7-10). 60 tablet 0   ibuprofen (ADVIL) 800 MG tablet TAKE 1 TABLET BY MOUTH EVERY 8 HOURS AS NEEDED 90 tablet 3   insulin NPH-regular Human (70-30) 100 UNIT/ML injection Inject 15 Units into the skin in the morning and at bedtime.     iron polysaccharides (NIFEREX) 150 MG capsule Take 1 capsule (150 mg total) by mouth daily. 30 capsule 2   lidocaine-prilocaine (EMLA) cream Apply to affected area once 30 g 3   lisinopril (ZESTRIL) 5 MG tablet Take 5 mg by mouth 2 (two) times daily.     metFORMIN (GLUCOPHAGE) 500 MG tablet TAKE TWO TABLETS EACH MORNING. AND 3 TABLETS IN THE EVENING AS DIRECTED 150 tablet 5   methocarbamol (ROBAXIN) 500 MG tablet Take 1 tablet (500 mg total) by mouth every 8 (eight) hours as needed for muscle spasms. 30 tablet 0   mirtazapine (REMERON) 7.5 MG tablet Take 1 tablet (7.5 mg total) by mouth at bedtime. 30 tablet 3   montelukast (SINGULAIR) 10 MG tablet TAKE 1 TABLET BY MOUTH EVERYDAY AT BEDTIME 90 tablet 1   Multiple Vitamin (MULTIVITAMIN WITH MINERALS) TABS tablet Take 1 tablet by mouth daily. 30 tablet 2   naloxone (NARCAN) nasal spray 4 mg/0.1 mL SPRAY 1 SPRAY INTO ONE NOSTRIL AS DIRECTED FOR OPIOID OVERDOSE (TURN PERSON ON SIDE AFTER DOSE. IF NO RESPONSE IN 2-3 MINUTES OR PERSON RESPONDS BUT RELAPSES, REPEAT USING A NEW SPRAY DEVICE AND SPRAY INTO THE OTHER NOSTRIL. CALL 911 AFTER USE.) * EMERGENCY USE ONLY * 1 each 0   nystatin (MYCOSTATIN/NYSTOP) powder APPLY TOPICALLY TWICE DAILY AS NEEDED 30 g 3   nystatin ointment (MYCOSTATIN) Apply 1 Application topically 2 (two) times daily. 30 g 0   ondansetron (ZOFRAN) 8 MG tablet Take 1 tablet (8 mg  total) by mouth every 8 (eight) hours as needed for nausea or vomiting. Start on the third day after carboplatin. 30 tablet 1   ondansetron (ZOFRAN-ODT) 8 MG disintegrating tablet Take 1 tablet (8 mg total) by mouth every 8 (eight) hours as needed for nausea or vomiting. 90 tablet 3   pantoprazole (PROTONIX) 40 MG tablet TAKE 1 TABLET BY MOUTH EVERY DAY 90 tablet 3   pravastatin (PRAVACHOL) 40 MG tablet TAKE 1 TABLET BY MOUTH EVERY DAY 30 tablet 5   prochlorperazine (COMPAZINE) 10 MG tablet Take 1 tablet (10 mg total) by mouth every 6 (six) hours as needed for nausea or vomiting. 30 tablet 1   zinc sulfate 220 (50 Zn) MG capsule Take 1 capsule (220 mg total) by mouth daily. 30 capsule 0   No current facility-administered medications for this visit.    Review of Systems  Constitutional:  Positive for appetite change and unexpected weight change. Negative for chills, fatigue and fever.  HENT:   Negative for hearing loss and voice change.   Eyes:  Negative for eye problems.  Respiratory:  Negative for chest tightness, cough and shortness of breath.   Cardiovascular:  Negative for chest pain and leg swelling.  Gastrointestinal:  Positive for abdominal distention and abdominal pain. Negative for blood in stool.  Endocrine: Negative for hot flashes.  Genitourinary:  Negative for difficulty  urinating and frequency.   Musculoskeletal:  Negative for arthralgias.       S/p left  BKA   Skin:  Negative for itching and rash.  Neurological:  Negative for extremity weakness.  Hematological:  Negative for adenopathy.  Psychiatric/Behavioral:  Negative for confusion.      PHYSICAL EXAMINATION: ECOG PERFORMANCE STATUS: 1 - Symptomatic but completely ambulatory  There were no vitals filed for this visit.  There were no vitals filed for this visit.   Physical Exam Constitutional:      General: She is not in acute distress.    Appearance: She is obese. She is not diaphoretic.  HENT:     Head:  Normocephalic and atraumatic.     Mouth/Throat:     Pharynx: No oropharyngeal exudate.  Eyes:     General: No scleral icterus. Cardiovascular:     Rate and Rhythm: Normal rate and regular rhythm.  Pulmonary:     Effort: Pulmonary effort is normal. No respiratory distress.     Breath sounds: Normal breath sounds. No wheezing.  Abdominal:     General: There is no distension.     Palpations: Abdomen is soft. There is mass.     Tenderness: There is no abdominal tenderness.  Musculoskeletal:        General: Normal range of motion.     Cervical back: Normal range of motion and neck supple.     Comments: S/p left BKA  Skin:    General: Skin is warm and dry.     Findings: No erythema.  Neurological:     Mental Status: She is alert and oriented to person, place, and time. Mental status is at baseline.     Cranial Nerves: No cranial nerve deficit.     Motor: No abnormal muscle tone.  Psychiatric:        Mood and Affect: Affect normal.     Comments: anxious     LABORATORY DATA:  I have reviewed the data as listed    Latest Ref Rng & Units 01/03/2023    8:32 AM 12/13/2022    8:08 AM 11/28/2022   12:59 PM  CBC  WBC 4.0 - 10.5 K/uL 4.9  7.1  5.3   Hemoglobin 12.0 - 15.0 g/dL 9.8  9.7  29.5   Hematocrit 36.0 - 46.0 % 30.7  30.4  31.8   Platelets 150 - 400 K/uL 176  391  347       Latest Ref Rng & Units 12/13/2022    8:08 AM 11/28/2022   12:59 PM 11/21/2022    8:09 AM  CMP  Glucose 70 - 99 mg/dL 621  308  657   BUN 6 - 20 mg/dL 21  25  25    Creatinine 0.44 - 1.00 mg/dL 8.46  9.62  9.52   Sodium 135 - 145 mmol/L 137  138  136   Potassium 3.5 - 5.1 mmol/L 4.0  4.7  4.6   Chloride 98 - 111 mmol/L 101  101  103   CO2 22 - 32 mmol/L 27  26  24    Calcium 8.9 - 10.3 mg/dL 9.2  9.1  8.8   Total Protein 6.5 - 8.1 g/dL 7.5  7.2  7.4   Total Bilirubin <1.2 mg/dL 0.2  0.4  0.2   Alkaline Phos 38 - 126 U/L 45  40  43   AST 15 - 41 U/L 15  22  16    ALT 0 - 44 U/L 15  14  12       RADIOGRAPHIC STUDIES: I have personally reviewed the radiological images as listed and agreed with the findings in the report. No results found.

## 2023-01-03 NOTE — Patient Instructions (Signed)

## 2023-01-03 NOTE — Telephone Encounter (Signed)
Clinical Social Work attempted to contact patient by phone.  Left voicemail with contact information and request for return call.

## 2023-01-04 LAB — CA 125: Cancer Antigen (CA) 125: 53.2 U/mL — ABNORMAL HIGH (ref 0.0–38.1)

## 2023-01-04 NOTE — Progress Notes (Signed)
[facility] Care - Final Intake Summary  Nancy [name] (DOB March 30, 1965) is a 57 y/o white female with primary peritoneal carcinomatosis (PPC) dx'd in Oct 2024, referred by Laurette Schimke @ [facility] for medication recommendations in adjustment to the patient's antidepressant/anxiolytic regimen. Rakiah is open to med recs. In addition to her recent cancer dx, Shaconda had her left leg amputated in Sep and had to go on disability. At time of CC referral, there are diagnoses of unspecified depression and mixed adjustment disorder noted in her chart.  Assessments:  Telisa scored PHQ-9=15 (moderately severe), reporting anhedonia, low mood/energy/appetite, trouble staying asleep & sleeping too much, trouble concentrating, and feeling bad about herself every day. CSSR-S=Low-Risk: Tymia reported several days of passive SI over the past 2 weeks. She denied specific thoughts about method or plan and reported no prior attempts.  Padme scored GAD-7=16 (severe), endorsing every symptom of anxiety on the GAD-7, the most frequent being fear and variable worries w/o ability to control. Selin reported a hx of anxiety with "attacks" during which her heart races. Primary areas of worry include finances, medical procedures, and her health.  Psych Tx/Hx: Maeby is currently taking Pristiq 100mg , Xanax 0.5mg , and Gabapentin 300mg . The Pristiq was prescribed 2+ years ago and has helped with anxiety, however she reports needing the PRN Xanax more frequently over the past 6 months with current stressors. The Gabapentin is prescribed for phantom limb pain post-amputation and is PRN; she doesn't take it nightly, but notes it does help with her sleep as well.  Psychosocial Hx: Lekia lives with her spouse [name], who cares for her. 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 She is now on disability. Zoila loves to cook and shop, and is supported by her wife and brother in navigating  activities with her new physical limitations.   Assigned Dx: D66.44: Adjustment disorder with mixed anxiety and depressed mood  --  Marissa Nestle [facility] Manager

## 2023-01-06 ENCOUNTER — Encounter: Payer: Self-pay | Admitting: Oncology

## 2023-01-06 ENCOUNTER — Other Ambulatory Visit: Payer: Self-pay | Admitting: Family

## 2023-01-09 ENCOUNTER — Ambulatory Visit (INDEPENDENT_AMBULATORY_CARE_PROVIDER_SITE_OTHER): Payer: 59 | Admitting: Family

## 2023-01-09 ENCOUNTER — Encounter: Payer: Self-pay | Admitting: Oncology

## 2023-01-09 VITALS — BP 140/80 | HR 91 | Ht 69.0 in | Wt 249.0 lb

## 2023-01-09 DIAGNOSIS — I5181 Takotsubo syndrome: Secondary | ICD-10-CM | POA: Diagnosis not present

## 2023-01-09 DIAGNOSIS — E1121 Type 2 diabetes mellitus with diabetic nephropathy: Secondary | ICD-10-CM

## 2023-01-09 DIAGNOSIS — Z794 Long term (current) use of insulin: Secondary | ICD-10-CM

## 2023-01-09 DIAGNOSIS — Z0130 Encounter for examination of blood pressure without abnormal findings: Secondary | ICD-10-CM

## 2023-01-09 DIAGNOSIS — G43009 Migraine without aura, not intractable, without status migrainosus: Secondary | ICD-10-CM | POA: Diagnosis not present

## 2023-01-09 LAB — GLUCOSE, POCT (MANUAL RESULT ENTRY): POC Glucose: 84 mg/dL (ref 70–99)

## 2023-01-09 MED ORDER — LISINOPRIL 10 MG PO TABS: ORAL_TABLET | ORAL | 11 refills | Status: DC

## 2023-01-09 MED ORDER — UBRELVY 100 MG PO TABS: 100.0000 mg | ORAL_TABLET | ORAL | 1 refills | Status: DC | PRN

## 2023-01-10 NOTE — Progress Notes (Signed)
 Established Patient Office Visit  Subjective:  Patient ID: Nancy [name], female    DOB: 06-26-1965  Age: 58 y.o. MRN: [id number]  Chief Complaint  Patient presents with   Follow-up    3 mo follow up/migraines    Patient is here today for her 3 months follow up.  She has been feeling poorly since last appointment.   She does have additional concerns to discuss today.  She has been having migraines frequently, causing her significant distress.  She has lost her hair completely, unsurprising due to her chemo.   Aside from the migraines, she's not having a lot of concerns, just feeling tired and having side effects from the chemo.   Labs are not due today. She needs refills.   I have reviewed her active problem list, medication list, allergies, notes from last encounter, lab results for her appointment today.      No other concerns at this time.   Past Medical History:  Diagnosis Date   Abnormal cardiovascular stress test 09/21/2018   Formatting of this note might be different from the original. Lexiscan Myoview 09/16/2018 revealed mild anterior ischemia   Adverse effect of motion 05/10/2014   AKI (acute kidney injury) (HCC) 10/22/2022   Anxiety    Arthritis    r knee   Asthma    Breast cyst 05/10/2014   CAD (coronary artery disease)    a.) LHC 06/04/2009 at [facility]; non-obstructive CAD. b.) CTA with FFR 10/08/2018: extensive mixed plaque proximal to mid LAD (51-69%); Coronary Ca score 217; FFR 0.71 dPDA, 0.86 mLAD, 0.87 dLCx.   CCF (congestive cardiac failure) (HCC) 06/06/2008   a.) 30% EF. b.) TTE 06/03/2011: EF >55%; triv MR. c.) TTE 04/27/2018: EF 55%; mild LVH; triv PR, mild MR/TR; G1DD.   Cellulitis of left lower extremity 09/18/2022   Chest pain with high risk for cardiac etiology 07/02/2016   Chronic use of opiate drug for therapeutic purpose 02/13/2022   Formatting of this note might be different from the original. [facility] Pain Contract signed on 04/18/16 & updated  03/04/17; UDS done on 04/18/16.   Complication of anesthesia    Diabetic foot infection (HCC) 03/13/2015   Diabetic foot ulcer (HCC) 02/08/2021   Diabetic ulcer of left heel associated with type 2 diabetes mellitus (HCC) 02/08/2021   Eczema    Family history of adverse reaction to anesthesia    a.) PONV in mother and grandmother   Gas gangrene (HCC) 09/18/2022   GERD (gastroesophageal reflux disease) 05/10/2014   History of kidney stones    History of MSSA bacteremia due to osteomyelitis L foot 09/18/22 10/22/2022   HLD (hyperlipidemia)    Hyponatremia 03/13/2015   Migraines    Motion sickness    MSSA bacteremia 09/20/2022   Osteomyelitis (HCC) 09/18/2022   Other acute osteomyelitis, left ankle and foot (HCC) 02/13/2022   Panic attacks    Peripheral edema 04/27/2018   Pneumonia    PONV (postoperative nausea and vomiting)    Sepsis secondary to diabetic foot infection 03/13/2015   Sepsis secondary to diabetic foot infection 03/13/2015   T2DM (type 2 diabetes mellitus) (HCC)    Takotsubo cardiomyopathy / transient apical balooning syndrome / stress-induced cardiomyopathy 05/10/2014   Unspecified essential hypertension     Past Surgical History:  Procedure Laterality Date   AMPUTATION Left 09/19/2022   Procedure: AMPUTATION BELOW KNEE;  Surgeon: Jama Cordella MATSU, MD;  Location: [facility];  Service: Vascular;  Laterality: Left;   AMPUTATION Left 09/24/2022  Procedure: AMPUTATION BELOW KNEE WITH WOUND CLOSURE;  Surgeon: Jama Cordella MATSU, MD;  Location: [facility];  Service: Vascular;  Laterality: Left;   AMPUTATION TOE Left 03/16/2015   Procedure: left fifth toe amputation with incision and drainage;  Surgeon: Eva Gay, DPM;  Location: [facility];  Service: Podiatry;  Laterality: Left;   APPLICATION OF WOUND VAC Left 02/14/2021   Procedure: APPLICATION OF WOUND VAC;  Surgeon: Gay Eva, DPM;  Location: [facility];  Service: Podiatry;  Laterality: Left;   APPLICATION OF WOUND VAC  Left 04/16/2021   Procedure: APPLICATION OF WOUND VAC;  Surgeon: Lowery Estefana RAMAN, DO;  Location: [facility];  Service: Plastics;  Laterality: Left;   CARDIAC CATHETERIZATION Left 06/04/2009   Procedure: CARDIAC CATHETERIZATION; Location: [facility]   CHOLECYSTECTOMY     DEBRIDEMENT AND CLOSURE WOUND Left 04/16/2021   Procedure: DEBRIDEMENT AND CLOSURE WOUND;  Surgeon: Lowery Estefana RAMAN, DO;  Location: [facility];  Service: Plastics;  Laterality: Left;  1 hour   IR IMAGING GUIDED PORT INSERTION  11/18/2022   IRRIGATION AND DEBRIDEMENT FOOT Left 02/08/2021   Procedure: IRRIGATION AND DEBRIDEMENT FOOT - LFT HEEL ULCER;  Surgeon: Gay Eva, DPM;  Location: [facility];  Service: Podiatry;  Laterality: Left;   IRRIGATION AND DEBRIDEMENT FOOT Left 02/14/2021   Procedure: IRRIGATION AND DEBRIDEMENT LEFT HEEL;  Surgeon: Gay Eva, DPM;  Location: [facility];  Service: Podiatry;  Laterality: Left;   kidney stone removal     KNEE ARTHROSCOPY W/ MENISCAL REPAIR     NOSE SURGERY  01/07/1989   due to fracture   TEE WITHOUT CARDIOVERSION N/A 09/26/2022   Procedure: TRANSESOPHAGEAL ECHOCARDIOGRAM;  Surgeon: Dewane Shiner, DO;  Location: [facility];  Service: Cardiovascular;  Laterality: N/A;   TOE AMPUTATION     second toe   TONSILLECTOMY     TOTAL KNEE ARTHROPLASTY  01/11/2011   Procedure: TOTAL KNEE ARTHROPLASTY;  Surgeon: Norleen LITTIE Gavel;  Location: [facility];  Service: Orthopedics;  Laterality: Right;  COMPUTER ASSISTED TOTAL KNEE REPLACEMENT    Social History   Socioeconomic History   Marital status: Married    Spouse name: Not on file   Number of children: Not on file   Years of education: Not on file   Highest education level: Not on file  Occupational History   Not on file  Tobacco Use   Smoking status: Former    Current packs/day: 0.00    Types: Cigarettes    Quit date: 03/12/1985    Years since quitting: 37.8   Smokeless tobacco: Never   Vaping Use   Vaping status: Never Used  Substance and Sexual Activity   Alcohol use: No   Drug use: No   Sexual activity: Yes    Birth control/protection: Post-menopausal  Other Topics Concern   Not on file  Social History Narrative   Wife, Barnie, at bedside. One indoor pet, dog.   Social Drivers of Corporate Investment Banker Strain: Not on file  Food Insecurity: No Food Insecurity (10/22/2022)   Hunger Vital Sign    Worried About Running Out of Food in the Last Year: Never true    Ran Out of Food in the Last Year: Never true  Transportation Needs: No Transportation Needs (10/22/2022)   PRAPARE - Administrator, Civil Service (Medical): No    Lack of Transportation (Non-Medical): No  Physical Activity: Not on file  Stress: Not on file  Social Connections: Not on  file  Intimate Partner Violence: Not At Risk (10/22/2022)   Humiliation, Afraid, Rape, and Kick questionnaire    Fear of Current or Ex-Partner: No    Emotionally Abused: No    Physically Abused: No    Sexually Abused: No    Family History  Problem Relation Age of Onset   Transient ischemic attack Mother    Asthma Mother    Hypertension Mother    Lung cancer Father 24   Hypertension Father    Asthma Sister    Anxiety disorder Sister    Asthma Sister    Hypertension Brother    Ovarian cancer Maternal Aunt 56   Melanoma Maternal Uncle        dx 35s   Lung cancer Paternal Aunt 46   Stomach cancer Maternal Grandmother    Colon cancer Maternal Grandmother    Lymphoma Maternal Grandmother    Melanoma Maternal Grandfather 72       sun exposure   Lung cancer Paternal Grandmother 52   Kidney cancer Paternal Grandfather        dx early   Crohn's disease Other    GER disease Other    Breast cancer Neg Hx     Allergies  Allergen Reactions   Shellfish Allergy Anaphylaxis   Codeine Other (See Comments)    Migraine  Other Reaction(s): Other (See Comments)  Reaction:  Severe migraines    Morphine  And Codeine Other (See Comments)    Reaction:  Severe migraines    Rosuvastatin     Other reaction(s): Muscle Pain   Percocet [Oxycodone -Acetaminophen ] Nausea And Vomiting   Sulfa Antibiotics Rash   Theophyllines Itching and Rash    Review of Systems  Constitutional:  Positive for malaise/fatigue.  All other systems reviewed and are negative.      Objective:   BP (!) 140/80   Pulse 91   Ht 5' 9 (1.753 m)   Wt 249 lb (112.9 kg)   LMP 02/08/2013 (Within Months)   SpO2 97%   BMI 36.77 kg/m   Vitals:   01/09/23 1503  BP: (!) 140/80  Pulse: 91  Height: 5' 9 (1.753 m)  Weight: 249 lb (112.9 kg)  SpO2: 97%  BMI (Calculated): 36.75    Physical Exam Vitals and nursing note reviewed.  Constitutional:      Appearance: Normal appearance. She is normal weight.  HENT:     Head: Normocephalic and atraumatic. Hair is abnormal (complete loss 2/2 chemo).  Eyes:     Extraocular Movements: Extraocular movements intact.     Conjunctiva/sclera: Conjunctivae normal.     Pupils: Pupils are equal, round, and reactive to light.  Cardiovascular:     Rate and Rhythm: Normal rate.  Pulmonary:     Effort: Pulmonary effort is normal.  Neurological:     General: No focal deficit present.     Mental Status: She is alert and oriented to person, place, and time. Mental status is at baseline.  Psychiatric:        Mood and Affect: Mood normal.        Behavior: Behavior normal.        Thought Content: Thought content normal.      Results for orders placed or performed in visit on 01/09/23  POCT Glucose (CBG)  Result Value Ref Range   POC Glucose 84 70 - 99 mg/dl    Recent Results (from the past 2160 hours)  Aerobic culture     Status: Abnormal   Collection  Time: 10/16/22 10:50 AM  Result Value Ref Range   Aerobic Bacterial Culture Final report (A)    Organism ID, Bacteria Serratia marcescens (A)     Comment: Heavy growth   Antimicrobial Susceptibility Comment      Comment:       ** S = Susceptible; I = Intermediate; R = Resistant **                    P = Positive; N = Negative             MICS are expressed in micrograms per mL    Antibiotic                 RSLT#1    RSLT#2    RSLT#3    RSLT#4 Amoxicillin /Clavulanic Acid    R Cefazolin                       R Cefepime                        S Ceftriaxone                     S Cefuroxime                     R Ciprofloxacin                   S Ertapenem                      S Gentamicin                     S Levofloxacin                    S Meropenem                       S Tetracycline                   R Tobramycin                     S Trimethoprim/Sulfa             S   Lactic acid, plasma     Status: None   Collection Time: 10/22/22  3:26 PM  Result Value Ref Range   Lactic Acid, Venous 1.0 0.5 - 1.9 mmol/L    Comment: Performed at [facility], [address] [place], [place] [address]  Comprehensive metabolic panel     Status: Abnormal   Collection Time: 10/22/22  3:26 PM  Result Value Ref Range   Sodium 133 (L) 135 - 145 mmol/L   Potassium 5.1 3.5 - 5.1 mmol/L   Chloride 97 (L) 98 - 111 mmol/L   CO2 24 22 - 32 mmol/L   Glucose, Bld 125 (H) 70 - 99 mg/dL    Comment: Glucose reference range applies only to samples taken after fasting for at least 8 hours.   BUN 54 (H) 6 - 20 mg/dL   Creatinine, Ser 8.80 (H) 0.44 - 1.00 mg/dL   Calcium 8.8 (L) 8.9 - 10.3 mg/dL   Total Protein 8.3 (H) 6.5 - 8.1 g/dL   Albumin 3.0 (L) 3.5 - 5.0 g/dL   AST 14 (L) 15 - 41 U/L   ALT 7 0 - 44 U/L   Alkaline  Phosphatase 56 38 - 126 U/L   Total Bilirubin 0.6 0.3 - 1.2 mg/dL   GFR, Estimated 53 (L) >60 mL/min    Comment: (NOTE) Calculated using the CKD-EPI Creatinine Equation (2021)    Anion gap 12 5 - 15    Comment: Performed at [facility], [address] [place], [place] [address]  CBC with Differential     Status: Abnormal   Collection Time: 10/22/22  3:26 PM  Result Value Ref  Range   WBC 11.7 (H) 4.0 - 10.5 K/uL   RBC 3.44 (L) 3.87 - 5.11 MIL/uL   Hemoglobin 9.8 (L) 12.0 - 15.0 g/dL   HCT 68.5 (L) 63.9 - 53.9 %   MCV 91.3 80.0 - 100.0 fL   MCH 28.5 26.0 - 34.0 pg   MCHC 31.2 30.0 - 36.0 g/dL   RDW 85.8 88.4 - 84.4 %   Platelets 552 (H) 150 - 400 K/uL   nRBC 0.0 0.0 - 0.2 %   Neutrophils Relative % 74 %   Neutro Abs 8.7 (H) 1.7 - 7.7 K/uL   Lymphocytes Relative 15 %   Lymphs Abs 1.7 0.7 - 4.0 K/uL   Monocytes Relative 7 %   Monocytes Absolute 0.9 0.1 - 1.0 K/uL   Eosinophils Relative 2 %   Eosinophils Absolute 0.2 0.0 - 0.5 K/uL   Basophils Relative 1 %   Basophils Absolute 0.1 0.0 - 0.1 K/uL   Immature Granulocytes 1 %   Abs Immature Granulocytes 0.07 0.00 - 0.07 K/uL    Comment: Performed at [facility], [address] [place], [place] [address]  Urinalysis, w/ Reflex to Culture (Infection Suspected) -Urine, Clean Catch     Status: Abnormal   Collection Time: 10/22/22  3:26 PM  Result Value Ref Range   Specimen Source URINE, CLEAN CATCH    Color, Urine YELLOW (A) YELLOW   APPearance HAZY (A) CLEAR   Specific Gravity, Urine 1.026 1.005 - 1.030   pH 5.0 5.0 - 8.0   Glucose, UA NEGATIVE NEGATIVE mg/dL   Hgb urine dipstick SMALL (A) NEGATIVE   Bilirubin Urine NEGATIVE NEGATIVE   Ketones, ur NEGATIVE NEGATIVE mg/dL   Protein, ur NEGATIVE NEGATIVE mg/dL   Nitrite NEGATIVE NEGATIVE   Leukocytes,Ua NEGATIVE NEGATIVE   RBC / HPF 6-10 0 - 5 RBC/hpf   WBC, UA 6-10 0 - 5 WBC/hpf    Comment:        Reflex urine culture not performed if WBC <=10, OR if Squamous epithelial cells >5. If Squamous epithelial cells >5 suggest recollection.    Bacteria, UA NONE SEEN NONE SEEN   Squamous Epithelial / HPF 0-5 0 - 5 /HPF   Mucus PRESENT    Hyaline Casts, UA PRESENT     Comment: Performed at [facility], [address] [place], [place] [address]  Procalcitonin     Status: None   Collection Time: 10/22/22  3:26 PM  Result Value Ref  Range   Procalcitonin 0.12 ng/mL    Comment:        Interpretation: PCT (Procalcitonin) <= 0.5 ng/mL: Systemic infection (sepsis) is not likely. Local bacterial infection is possible. (NOTE)       Sepsis PCT Algorithm           Lower Respiratory Tract  Infection PCT Algorithm    ----------------------------     ----------------------------         PCT < 0.25 ng/mL                PCT < 0.10 ng/mL          Strongly encourage             Strongly discourage   discontinuation of antibiotics    initiation of antibiotics    ----------------------------     -----------------------------       PCT 0.25 - 0.50 ng/mL            PCT 0.10 - 0.25 ng/mL               OR       >80% decrease in PCT            Discourage initiation of                                            antibiotics      Encourage discontinuation           of antibiotics    ----------------------------     -----------------------------         PCT >= 0.50 ng/mL              PCT 0.26 - 0.50 ng/mL               AND        <80% decrease in PCT             Encourage initiation of                                             antibiotics       Encourage continuation           of antibiotics    ----------------------------     -----------------------------        PCT >= 0.50 ng/mL                  PCT > 0.50 ng/mL               AND         increase in PCT                  Strongly encourage                                      initiation of antibiotics    Strongly encourage escalation           of antibiotics                                     -----------------------------                                           PCT <= 0.25 ng/mL  OR                                        > 80% decrease in PCT                                      Discontinue / Do not initiate                                             antibiotics  Performed at [facility], [address] [place], [place] [address]   Lipase, blood     Status: None   Collection Time: 10/22/22  4:03 PM  Result Value Ref Range   Lipase 23 11 - 51 U/L    Comment: Performed at [facility], [address] [place], [place] [address]  Resp panel by RT-PCR (RSV, Flu A&B, Covid) Anterior Nasal Swab     Status: None   Collection Time: 10/22/22  4:03 PM   Specimen: Anterior Nasal Swab  Result Value Ref Range   SARS Coronavirus 2 by RT PCR NEGATIVE NEGATIVE    Comment: (NOTE) SARS-CoV-2 target nucleic acids are NOT DETECTED.  The SARS-CoV-2 RNA is generally detectable in upper respiratory specimens during the acute phase of infection. The lowest concentration of SARS-CoV-2 viral copies this assay can detect is 138 copies/mL. A negative result does not preclude SARS-Cov-2 infection and should not be used as the sole basis for treatment or other patient management decisions. A negative result may occur with  improper specimen collection/handling, submission of specimen other than nasopharyngeal swab, presence of viral mutation(s) within the areas targeted by this assay, and inadequate number of viral copies(<138 copies/mL). A negative result must be combined with clinical observations, patient history, and epidemiological information. The expected result is Negative.  Fact Sheet for Patients:  bloggercourse.com  Fact Sheet for Healthcare Providers:  seriousbroker.it  This test is no t yet approved or cleared by the [place]  FDA and  has been authorized for detection and/or diagnosis of SARS-CoV-2 by FDA under an Emergency Use Authorization (EUA). This EUA will remain  in effect (meaning this test can be used) for the duration of the COVID-19 declaration under Section 564(b)(1) of the Act, 21 U.S.C.section 360bbb-3(b)(1), unless the authorization is terminated  or revoked sooner.        Influenza A by PCR NEGATIVE NEGATIVE   Influenza B by PCR NEGATIVE NEGATIVE    Comment: (NOTE) The Xpert Xpress SARS-CoV-2/FLU/RSV plus assay is intended as an aid in the diagnosis of influenza from Nasopharyngeal swab specimens and should not be used as a sole basis for treatment. Nasal washings and aspirates are unacceptable for Xpert Xpress SARS-CoV-2/FLU/RSV testing.  Fact Sheet for Patients: bloggercourse.com  Fact Sheet for Healthcare Providers: seriousbroker.it  This test is not yet approved or cleared by the [place]  FDA and has been authorized for detection and/or diagnosis of SARS-CoV-2 by FDA under an Emergency Use Authorization (EUA). This EUA will remain in effect (meaning this test can be used) for the duration of the COVID-19 declaration under Section 564(b)(1) of the Act, 21 U.S.C. section 360bbb-3(b)(1), unless the authorization is terminated or revoked.  Resp Syncytial Virus by PCR NEGATIVE NEGATIVE    Comment: (NOTE) Fact Sheet for Patients: bloggercourse.com  Fact Sheet for Healthcare Providers: seriousbroker.it  This test is not yet approved or cleared by the [place]  FDA and has been authorized for detection and/or diagnosis of SARS-CoV-2 by FDA under an Emergency Use Authorization (EUA). This EUA will remain in effect (meaning this test can be used) for the duration of the COVID-19 declaration under Section 564(b)(1) of the Act, 21 U.S.C. section 360bbb-3(b)(1), unless the authorization is terminated or revoked.  Performed at [facility], [address] [place], [place] [address]   Glucose, capillary     Status: None   Collection Time: 10/22/22  9:49 PM  Result Value Ref Range   Glucose-Capillary 92 70 - 99 mg/dL    Comment: Glucose reference range applies only to samples taken after fasting for at least 8 hours.  Cytology -  Non PAP;     Status: None   Collection Time: 10/23/22 12:00 AM  Result Value Ref Range   CYTOLOGY - NON GYN      CYTOLOGY - NON PAP [facility] [address], [address] [place], [place] [address] Telephone [phone] or [phone] Fax [phone]  CYTOPATHOLOGY REPORT   Accession #: [id number] Patient Name: Nancy [name], Nancy [name] Visit # : [id number]  MRN: [id number] Physician: Jhonny Sahara DOB/Age [birth year] (Age: 50) Gender: F Collected Date: 10/23/2022 Received Date: 10/23/2022  FINAL DIAGNOSIS STATEMENT Of SPECIMEN ADEQUACY:  INTERPRETATION(S):      - MALIGNANT CELLS PRESENT      - SEE NOTE   Diagnosis Note : The patient's clinical history and recent CT findings of large volume ascites and omental caking concerning for peritoneal carcinomatosis are noted.  The peritoneal fluid is positive for malignant cells. Immunohistochemical stains (CK7, CK20, PAX8, ER, WT1, GATA3, TTF-1 and CDX2) to further characterize the tumor cells are pending and will be reported in an addendum.  Dr. Jhonny was notified via secure chat on 10/25/2022.    CORRECTED [id number]: Immunohistochemistry Addendum:  The sparse cellularity makes the accurate evaluation of the immunohistochemical stains problematic.  Additional tissue sampling is recommended to further classify the tumor cells.  Clinical and radiological correlation recommended.  DATE SIGNED OUT: 10/25/2022 ELECTRONIC SIGNATURE : Kin M.D., Annah SQUIBB., Pathologist, Electronic Signature   CASE COMMENTS   CLINICAL HISTORY  SOURCE OF SPECIMEN(S) Peritoneal/Ascitic Fluid  SPECIMEN COMMENTS: SPECIMEN CLINICAL INFORMATION:    Gross Description Specimen: Received is/are of cloudy light brown fluid and one prestained slide      Prepared:      # Smears: 1      # Concentration Technique Slides (i.e. ThinPrep): 1      # Cell Block: 1      # Diff-Quick Stain:  0        Report signed out from the following location(s) [place] [address] [place], [place] [address] CLIA #: [id number]  [facility] [address] [place], [place] [address] CLIA #: [id number]   Glucose, capillary     Status: Abnormal   Collection Time: 10/23/22  4:08 AM  Result Value Ref Range   Glucose-Capillary 110 (H) 70 - 99 mg/dL    Comment: Glucose reference range applies only to samples taken after fasting for at least 8 hours.  Comprehensive metabolic panel     Status: Abnormal   Collection Time: 10/23/22  5:10 AM  Result Value Ref  Range   Sodium 132 (L) 135 - 145 mmol/L   Potassium 4.8 3.5 - 5.1 mmol/L   Chloride 99 98 - 111 mmol/L   CO2 24 22 - 32 mmol/L   Glucose, Bld 127 (H) 70 - 99 mg/dL    Comment: Glucose reference range applies only to samples taken after fasting for at least 8 hours.   BUN 48 (H) 6 - 20 mg/dL   Creatinine, Ser 8.86 (H) 0.44 - 1.00 mg/dL   Calcium 8.3 (L) 8.9 - 10.3 mg/dL   Total Protein 7.3 6.5 - 8.1 g/dL   Albumin 2.6 (L) 3.5 - 5.0 g/dL   AST 13 (L) 15 - 41 U/L   ALT 7 0 - 44 U/L   Alkaline Phosphatase 53 38 - 126 U/L   Total Bilirubin 0.5 0.3 - 1.2 mg/dL   GFR, Estimated 57 (L) >60 mL/min    Comment: (NOTE) Calculated using the CKD-EPI Creatinine Equation (2021)    Anion gap 9 5 - 15    Comment: Performed at [facility], [address] [place], [place] [address]  CBC     Status: Abnormal   Collection Time: 10/23/22  5:10 AM  Result Value Ref Range   WBC 9.7 4.0 - 10.5 K/uL   RBC 3.22 (L) 3.87 - 5.11 MIL/uL   Hemoglobin 9.2 (L) 12.0 - 15.0 g/dL   HCT 70.8 (L) 63.9 - 53.9 %   MCV 90.4 80.0 - 100.0 fL   MCH 28.6 26.0 - 34.0 pg   MCHC 31.6 30.0 - 36.0 g/dL   RDW 85.6 88.4 - 84.4 %   Platelets 537 (H) 150 - 400 K/uL   nRBC 0.0 0.0 - 0.2 %    Comment: Performed at [facility], [address] [place], [place] [address]  Brain natriuretic peptide     Status:  None   Collection Time: 10/23/22  5:10 AM  Result Value Ref Range   B Natriuretic Peptide 42.8 0.0 - 100.0 pg/mL    Comment: Performed at [facility], [address] [place], [place] [address]  Glucose, capillary     Status: Abnormal   Collection Time: 10/23/22  7:38 AM  Result Value Ref Range   Glucose-Capillary 140 (H) 70 - 99 mg/dL    Comment: Glucose reference range applies only to samples taken after fasting for at least 8 hours.  Glucose, capillary     Status: Abnormal   Collection Time: 10/23/22 11:08 AM  Result Value Ref Range   Glucose-Capillary 66 (L) 70 - 99 mg/dL    Comment: Glucose reference range applies only to samples taken after fasting for at least 8 hours.  Glucose, capillary     Status: Abnormal   Collection Time: 10/23/22 11:35 AM  Result Value Ref Range   Glucose-Capillary 63 (L) 70 - 99 mg/dL    Comment: Glucose reference range applies only to samples taken after fasting for at least 8 hours.  Glucose, capillary     Status: None   Collection Time: 10/23/22 12:01 PM  Result Value Ref Range   Glucose-Capillary 77 70 - 99 mg/dL    Comment: Glucose reference range applies only to samples taken after fasting for at least 8 hours.  Body fluid cell count with differential     Status: Abnormal   Collection Time: 10/23/22  2:20 PM  Result Value Ref Range   Fluid Type-FCT CYTO PERI    Color, Fluid YELLOW (A) YELLOW   Appearance, Fluid  CLOUDY (A) CLEAR   Total Nucleated Cell Count, Fluid 5,696 cu mm   Neutrophil Count, Fluid 29 %   Lymphs, Fluid 46 %   Monocyte-Macrophage-Serous Fluid 25 %   Eos, Fluid 0 %    Comment: Performed at [facility], [address] [place], [place] [address]  Albumin, pleural or peritoneal fluid      Status: None   Collection Time: 10/23/22  2:20 PM  Result Value Ref Range   Albumin, Fluid 2.3 g/dL    Comment: (NOTE) No normal range established for this test Results should be evaluated in conjunction with serum  values    Fluid Type-FALB CYTO PERI     Comment: Performed at [facility], [address] [place], [place] [address]  Lipase, Fluid     Status: None   Collection Time: 10/23/22  2:20 PM  Result Value Ref Range   Lipase-Fluid 8 U/L    Comment: (NOTE) INTERPRETIVE INFORMATION: Lipase, Fluid For information on body fluid reference ranges and/or interpretive guidance visit [email or web address] This test was developed and its performance characteristics determined by [employer]. It has not been cleared or approved by the [place]  Food and Drug Administration. This test was performed in a CLIA certified laboratory and is intended for clinical purposes. Performed At: [facility] [address] [place], [place] [id number] Shanna Dorn SAUNDERS MDPhD Ey:[phone]    Source of Sample PERITONEAL     Comment: Performed at [facility], [address] [place], [place] [address]  Body fluid culture w Gram Stain     Status: None   Collection Time: 10/23/22  2:20 PM   Specimen: PATH Cytology Peritoneal fluid  Result Value Ref Range   Specimen Description      PERITONEAL Performed at [facility], [address] [place], [place] [address]    Special Requests      PERITONEAL Performed at [facility], [address] [place], [place] [address]    Gram Stain      WBC PRESENT,BOTH PMN AND MONONUCLEAR NO ORGANISMS SEEN CYTOSPIN SMEAR    Culture      NO GROWTH 3 DAYS Performed at [facility], [address], [place], [place] [address]    Report Status 10/27/2022 FINAL   Glucose, capillary     Status: Abnormal   Collection Time: 10/23/22  3:43 PM  Result Value Ref Range   Glucose-Capillary 124 (H) 70 - 99 mg/dL    Comment: Glucose reference range applies only to samples taken after fasting for at least 8 hours.  Glucose, capillary     Status: Abnormal   Collection Time: 10/23/22  7:26 PM  Result Value Ref Range    Glucose-Capillary 59 (L) 70 - 99 mg/dL    Comment: Glucose reference range applies only to samples taken after fasting for at least 8 hours.  Glucose, capillary     Status: None   Collection Time: 10/23/22  8:36 PM  Result Value Ref Range   Glucose-Capillary 71 70 - 99 mg/dL    Comment: Glucose reference range applies only to samples taken after fasting for at least 8 hours.  Glucose, capillary     Status: Abnormal   Collection Time: 10/23/22 11:30 PM  Result Value Ref Range   Glucose-Capillary 103 (H) 70 - 99 mg/dL    Comment: Glucose reference range applies only to samples taken after fasting for at least 8 hours.  Glucose, capillary  Status: Abnormal   Collection Time: 10/24/22  4:09 AM  Result Value Ref Range   Glucose-Capillary 120 (H) 70 - 99 mg/dL    Comment: Glucose reference range applies only to samples taken after fasting for at least 8 hours.  Glucose, capillary     Status: Abnormal   Collection Time: 10/24/22  8:18 AM  Result Value Ref Range   Glucose-Capillary 120 (H) 70 - 99 mg/dL    Comment: Glucose reference range applies only to samples taken after fasting for at least 8 hours.   Comment 1 Notify RN    Comment 2 Document in Chart   Glucose, capillary     Status: Abnormal   Collection Time: 10/24/22 11:32 AM  Result Value Ref Range   Glucose-Capillary 141 (H) 70 - 99 mg/dL    Comment: Glucose reference range applies only to samples taken after fasting for at least 8 hours.   Comment 1 Notify RN    Comment 2 Document in Chart   Glucose, capillary     Status: Abnormal   Collection Time: 10/24/22  3:28 PM  Result Value Ref Range   Glucose-Capillary 137 (H) 70 - 99 mg/dL    Comment: Glucose reference range applies only to samples taken after fasting for at least 8 hours.   Comment 1 Notify RN    Comment 2 Document in Chart   Glucose, capillary     Status: Abnormal   Collection Time: 10/24/22  7:21 PM  Result Value Ref Range   Glucose-Capillary 134 (H) 70 - 99  mg/dL    Comment: Glucose reference range applies only to samples taken after fasting for at least 8 hours.  Glucose, capillary     Status: Abnormal   Collection Time: 10/24/22 11:14 PM  Result Value Ref Range   Glucose-Capillary 133 (H) 70 - 99 mg/dL    Comment: Glucose reference range applies only to samples taken after fasting for at least 8 hours.  Glucose, capillary     Status: Abnormal   Collection Time: 10/25/22  3:33 AM  Result Value Ref Range   Glucose-Capillary 122 (H) 70 - 99 mg/dL    Comment: Glucose reference range applies only to samples taken after fasting for at least 8 hours.   Comment 1 Notify RN   Glucose, capillary     Status: Abnormal   Collection Time: 10/25/22  8:14 AM  Result Value Ref Range   Glucose-Capillary 125 (H) 70 - 99 mg/dL    Comment: Glucose reference range applies only to samples taken after fasting for at least 8 hours.   Comment 1 Notify RN    Comment 2 Document in Chart   Glucose, capillary     Status: Abnormal   Collection Time: 10/25/22 11:42 AM  Result Value Ref Range   Glucose-Capillary 182 (H) 70 - 99 mg/dL    Comment: Glucose reference range applies only to samples taken after fasting for at least 8 hours.   Comment 1 Notify RN    Comment 2 Document in Chart   Glucose, capillary     Status: Abnormal   Collection Time: 10/25/22  4:15 PM  Result Value Ref Range   Glucose-Capillary 140 (H) 70 - 99 mg/dL    Comment: Glucose reference range applies only to samples taken after fasting for at least 8 hours.   Comment 1 Notify RN    Comment 2 Document in Chart   Glucose, capillary     Status: Abnormal   Collection  Time: 10/25/22  8:42 PM  Result Value Ref Range   Glucose-Capillary 152 (H) 70 - 99 mg/dL    Comment: Glucose reference range applies only to samples taken after fasting for at least 8 hours.  Glucose, capillary     Status: Abnormal   Collection Time: 10/26/22 12:34 AM  Result Value Ref Range   Glucose-Capillary 116 (H) 70 - 99  mg/dL    Comment: Glucose reference range applies only to samples taken after fasting for at least 8 hours.  Glucose, capillary     Status: Abnormal   Collection Time: 10/26/22  7:59 AM  Result Value Ref Range   Glucose-Capillary 127 (H) 70 - 99 mg/dL    Comment: Glucose reference range applies only to samples taken after fasting for at least 8 hours.   Comment 1 Notify RN    Comment 2 Document in Chart   CBC with Differential/Platelet     Status: Abnormal   Collection Time: 10/26/22  8:07 AM  Result Value Ref Range   WBC 10.2 4.0 - 10.5 K/uL   RBC 3.59 (L) 3.87 - 5.11 MIL/uL   Hemoglobin 10.2 (L) 12.0 - 15.0 g/dL   HCT 67.4 (L) 63.9 - 53.9 %   MCV 90.5 80.0 - 100.0 fL   MCH 28.4 26.0 - 34.0 pg   MCHC 31.4 30.0 - 36.0 g/dL   RDW 86.0 88.4 - 84.4 %   Platelets 689 (H) 150 - 400 K/uL   nRBC 0.0 0.0 - 0.2 %   Neutrophils Relative % 61 %   Neutro Abs 6.3 1.7 - 7.7 K/uL   Lymphocytes Relative 24 %   Lymphs Abs 2.5 0.7 - 4.0 K/uL   Monocytes Relative 8 %   Monocytes Absolute 0.8 0.1 - 1.0 K/uL   Eosinophils Relative 4 %   Eosinophils Absolute 0.4 0.0 - 0.5 K/uL   Basophils Relative 2 %   Basophils Absolute 0.2 (H) 0.0 - 0.1 K/uL   Immature Granulocytes 1 %   Abs Immature Granulocytes 0.05 0.00 - 0.07 K/uL    Comment: Performed at [facility], [address] [place], [place] [address]  Basic metabolic panel     Status: Abnormal   Collection Time: 10/26/22  8:07 AM  Result Value Ref Range   Sodium 134 (L) 135 - 145 mmol/L   Potassium 5.5 (H) 3.5 - 5.1 mmol/L   Chloride 100 98 - 111 mmol/L   CO2 27 22 - 32 mmol/L   Glucose, Bld 150 (H) 70 - 99 mg/dL    Comment: Glucose reference range applies only to samples taken after fasting for at least 8 hours.   BUN 34 (H) 6 - 20 mg/dL   Creatinine, Ser 9.19 0.44 - 1.00 mg/dL   Calcium 8.9 8.9 - 89.6 mg/dL   GFR, Estimated >39 >39 mL/min    Comment: (NOTE) Calculated using the CKD-EPI Creatinine Equation (2021)    Anion gap  7 5 - 15    Comment: Performed at [facility], [address] [place], [place] [address]  CA 19-9 (SERIAL)     Status: None   Collection Time: 10/26/22  8:07 AM  Result Value Ref Range   CA 19-9 5 0 - 35 U/mL    Comment: (NOTE) [employer] Electrochemiluminescence Immunoassay (ECLIA) Values obtained with different assay methods or kits cannot be used interchangeably.  Results cannot be interpreted as absolute evidence of the presence or absence of malignant disease. Performed At: [facility]  [place] [address] [place], [place] [id number] Jennette Shorter MD Ey:[phone]   CA 125     Status: Abnormal   Collection Time: 10/26/22  8:07 AM  Result Value Ref Range   Cancer Antigen (CA) 125 762.0 (H) 0.0 - 38.1 U/mL    Comment: (NOTE) [employer] Electrochemiluminescence Immunoassay (ECLIA) Values obtained with different assay methods or kits cannot be used interchangeably.  Results cannot be interpreted as absolute evidence of the presence or absence of malignant disease. Performed At: [facility] [address] [place], [place] [id number] Jennette Shorter MD Ey:[phone]   CEA     Status: None   Collection Time: 10/26/22  8:07 AM  Result Value Ref Range   CEA <0.6 0.0 - 4.7 ng/mL    Comment: (NOTE)                             Nonsmokers          <3.9                             Smokers             <5.6 [employer] Electrochemiluminescence Immunoassay (ECLIA) Values obtained with different assay methods or kits cannot be used interchangeably.  Results cannot be interpreted as absolute evidence of the presence or absence of malignant disease. Performed At: [facility] [address] [place], [place] [id number] Jennette Shorter MD Ey:[id number]   Glucose, capillary     Status: Abnormal   Collection Time: 10/26/22 12:01 PM  Result Value Ref Range   Glucose-Capillary 153 (H) 70 - 99 mg/dL    Comment: Glucose reference range  applies only to samples taken after fasting for at least 8 hours.   Comment 1 Notify RN    Comment 2 Document in Chart   Glucose, capillary     Status: Abnormal   Collection Time: 10/26/22  4:50 PM  Result Value Ref Range   Glucose-Capillary 158 (H) 70 - 99 mg/dL    Comment: Glucose reference range applies only to samples taken after fasting for at least 8 hours.   Comment 1 Notify RN    Comment 2 Document in Chart   Glucose, capillary     Status: Abnormal   Collection Time: 10/26/22  8:20 PM  Result Value Ref Range   Glucose-Capillary 167 (H) 70 - 99 mg/dL    Comment: Glucose reference range applies only to samples taken after fasting for at least 8 hours.  Glucose, capillary     Status: Abnormal   Collection Time: 10/26/22  8:53 PM  Result Value Ref Range   Glucose-Capillary 142 (H) 70 - 99 mg/dL    Comment: Glucose reference range applies only to samples taken after fasting for at least 8 hours.  Glucose, capillary     Status: Abnormal   Collection Time: 10/26/22 11:56 PM  Result Value Ref Range   Glucose-Capillary 147 (H) 70 - 99 mg/dL    Comment: Glucose reference range applies only to samples taken after fasting for at least 8 hours.  Glucose, capillary     Status: Abnormal   Collection Time: 10/27/22  3:32 AM  Result Value Ref Range   Glucose-Capillary 136 (H) 70 - 99 mg/dL    Comment: Glucose reference range applies only to samples taken after fasting for at least 8 hours.  Glucose, capillary  Status: Abnormal   Collection Time: 10/27/22  7:39 AM  Result Value Ref Range   Glucose-Capillary 139 (H) 70 - 99 mg/dL    Comment: Glucose reference range applies only to samples taken after fasting for at least 8 hours.  Glucose, capillary     Status: Abnormal   Collection Time: 10/27/22 11:16 AM  Result Value Ref Range   Glucose-Capillary 178 (H) 70 - 99 mg/dL    Comment: Glucose reference range applies only to samples taken after fasting for at least 8 hours.   Comment 1  Notify RN    Comment 2 Document in Chart   Glucose, capillary     Status: Abnormal   Collection Time: 10/27/22  4:24 PM  Result Value Ref Range   Glucose-Capillary 146 (H) 70 - 99 mg/dL    Comment: Glucose reference range applies only to samples taken after fasting for at least 8 hours.   Comment 1 Notify RN    Comment 2 Document in Chart   Glucose, capillary     Status: Abnormal   Collection Time: 10/27/22  7:31 PM  Result Value Ref Range   Glucose-Capillary 178 (H) 70 - 99 mg/dL    Comment: Glucose reference range applies only to samples taken after fasting for at least 8 hours.  Glucose, capillary     Status: Abnormal   Collection Time: 10/27/22 11:56 PM  Result Value Ref Range   Glucose-Capillary 131 (H) 70 - 99 mg/dL    Comment: Glucose reference range applies only to samples taken after fasting for at least 8 hours.  Glucose, capillary     Status: Abnormal   Collection Time: 10/28/22  4:28 AM  Result Value Ref Range   Glucose-Capillary 157 (H) 70 - 99 mg/dL    Comment: Glucose reference range applies only to samples taken after fasting for at least 8 hours.  Glucose, capillary     Status: Abnormal   Collection Time: 10/28/22  7:38 AM  Result Value Ref Range   Glucose-Capillary 152 (H) 70 - 99 mg/dL    Comment: Glucose reference range applies only to samples taken after fasting for at least 8 hours.   Comment 1 Notify RN    Comment 2 Document in Chart   Glucose, capillary     Status: Abnormal   Collection Time: 10/28/22 11:39 AM  Result Value Ref Range   Glucose-Capillary 158 (H) 70 - 99 mg/dL    Comment: Glucose reference range applies only to samples taken after fasting for at least 8 hours.  Glucose, capillary     Status: Abnormal   Collection Time: 10/28/22  3:35 PM  Result Value Ref Range   Glucose-Capillary 137 (H) 70 - 99 mg/dL    Comment: Glucose reference range applies only to samples taken after fasting for at least 8 hours.   Comment 1 Notify RN    Comment 2  Document in Chart   Glucose, capillary     Status: Abnormal   Collection Time: 10/28/22  8:17 PM  Result Value Ref Range   Glucose-Capillary 164 (H) 70 - 99 mg/dL    Comment: Glucose reference range applies only to samples taken after fasting for at least 8 hours.  Glucose, capillary     Status: Abnormal   Collection Time: 10/28/22 11:49 PM  Result Value Ref Range   Glucose-Capillary 155 (H) 70 - 99 mg/dL    Comment: Glucose reference range applies only to samples taken after fasting for at least 8  hours.  Glucose, capillary     Status: Abnormal   Collection Time: 10/29/22  4:06 AM  Result Value Ref Range   Glucose-Capillary 131 (H) 70 - 99 mg/dL    Comment: Glucose reference range applies only to samples taken after fasting for at least 8 hours.  CBC with Differential/Platelet     Status: Abnormal   Collection Time: 10/29/22  4:52 AM  Result Value Ref Range   WBC 9.4 4.0 - 10.5 K/uL   RBC 3.51 (L) 3.87 - 5.11 MIL/uL   Hemoglobin 10.0 (L) 12.0 - 15.0 g/dL   HCT 67.7 (L) 63.9 - 53.9 %   MCV 91.7 80.0 - 100.0 fL   MCH 28.5 26.0 - 34.0 pg   MCHC 31.1 30.0 - 36.0 g/dL   RDW 86.0 88.4 - 84.4 %   Platelets 671 (H) 150 - 400 K/uL   nRBC 0.0 0.0 - 0.2 %   Neutrophils Relative % 56 %   Neutro Abs 5.2 1.7 - 7.7 K/uL   Lymphocytes Relative 29 %   Lymphs Abs 2.7 0.7 - 4.0 K/uL   Monocytes Relative 8 %   Monocytes Absolute 0.8 0.1 - 1.0 K/uL   Eosinophils Relative 5 %   Eosinophils Absolute 0.5 0.0 - 0.5 K/uL   Basophils Relative 1 %   Basophils Absolute 0.1 0.0 - 0.1 K/uL   Immature Granulocytes 1 %   Abs Immature Granulocytes 0.10 (H) 0.00 - 0.07 K/uL    Comment: Performed at [facility], [address] [place], [place] [address]  Basic metabolic panel     Status: Abnormal   Collection Time: 10/29/22  4:52 AM  Result Value Ref Range   Sodium 133 (L) 135 - 145 mmol/L   Potassium 4.8 3.5 - 5.1 mmol/L   Chloride 98 98 - 111 mmol/L   CO2 26 22 - 32 mmol/L   Glucose, Bld  147 (H) 70 - 99 mg/dL    Comment: Glucose reference range applies only to samples taken after fasting for at least 8 hours.   BUN 33 (H) 6 - 20 mg/dL   Creatinine, Ser 9.25 0.44 - 1.00 mg/dL   Calcium 8.7 (L) 8.9 - 10.3 mg/dL   GFR, Estimated >39 >39 mL/min    Comment: (NOTE) Calculated using the CKD-EPI Creatinine Equation (2021)    Anion gap 9 5 - 15    Comment: Performed at [facility], [address] [place], [place] [address]  Glucose, capillary     Status: Abnormal   Collection Time: 10/29/22  8:39 AM  Result Value Ref Range   Glucose-Capillary 178 (H) 70 - 99 mg/dL    Comment: Glucose reference range applies only to samples taken after fasting for at least 8 hours.   Comment 1 Notify RN    Comment 2 Document in Chart   Glucose, capillary     Status: Abnormal   Collection Time: 10/29/22 11:47 AM  Result Value Ref Range   Glucose-Capillary 166 (H) 70 - 99 mg/dL    Comment: Glucose reference range applies only to samples taken after fasting for at least 8 hours.   Comment 1 Notify RN    Comment 2 Document in Chart   Glucose, capillary     Status: Abnormal   Collection Time: 10/29/22  4:39 PM  Result Value Ref Range   Glucose-Capillary 145 (H) 70 - 99 mg/dL    Comment: Glucose reference range applies only to samples taken after fasting for at  least 8 hours.  Glucose, capillary     Status: Abnormal   Collection Time: 10/29/22  6:44 PM  Result Value Ref Range   Glucose-Capillary 161 (H) 70 - 99 mg/dL    Comment: Glucose reference range applies only to samples taken after fasting for at least 8 hours.   Comment 1 Notify RN    Comment 2 Document in Chart   Glucose, capillary     Status: Abnormal   Collection Time: 10/29/22  8:38 PM  Result Value Ref Range   Glucose-Capillary 157 (H) 70 - 99 mg/dL    Comment: Glucose reference range applies only to samples taken after fasting for at least 8 hours.  Glucose, capillary     Status: Abnormal   Collection Time:  10/30/22  1:37 AM  Result Value Ref Range   Glucose-Capillary 138 (H) 70 - 99 mg/dL    Comment: Glucose reference range applies only to samples taken after fasting for at least 8 hours.   Comment 1 QC Due   Glucose, capillary     Status: Abnormal   Collection Time: 10/30/22  4:13 AM  Result Value Ref Range   Glucose-Capillary 131 (H) 70 - 99 mg/dL    Comment: Glucose reference range applies only to samples taken after fasting for at least 8 hours.  Phosphorus     Status: None   Collection Time: 10/30/22  4:54 AM  Result Value Ref Range   Phosphorus 4.1 2.5 - 4.6 mg/dL    Comment: Performed at [facility], [address] [place], [place] [address]  Magnesium      Status: None   Collection Time: 10/30/22  4:54 AM  Result Value Ref Range   Magnesium  1.7 1.7 - 2.4 mg/dL    Comment: Performed at [facility], [address] [place], [place] [address]  Glucose, capillary     Status: Abnormal   Collection Time: 10/30/22  8:48 AM  Result Value Ref Range   Glucose-Capillary 137 (H) 70 - 99 mg/dL    Comment: Glucose reference range applies only to samples taken after fasting for at least 8 hours.  Glucose, capillary     Status: Abnormal   Collection Time: 10/30/22 11:37 AM  Result Value Ref Range   Glucose-Capillary 148 (H) 70 - 99 mg/dL    Comment: Glucose reference range applies only to samples taken after fasting for at least 8 hours.  POCT CBG (Fasting - Glucose)     Status: Abnormal   Collection Time: 11/12/22  1:02 PM  Result Value Ref Range   Glucose Fasting, POC 113 (A) 70 - 99 mg/dL  POCT XPERT XPRESS SARS COVID-2/FLU/RSV     Status: None   Collection Time: 11/12/22  2:51 PM  Result Value Ref Range   SARS Coronavirus 2 neg    FLU A neg    FLU B neg    RSV RNA, PCR neg   Surgical pathology     Status: None   Collection Time: 11/18/22 12:00 AM  Result Value Ref Range   SURGICAL PATHOLOGY      SURGICAL PATHOLOGY [facility] [address], [address] [place], [place] [address] Telephone [phone] or [phone] Fax [phone]  REPORT OF SURGICAL PATHOLOGY   Accession #: [id number] Patient Name: Nancy [name], Nancy [name] Visit # : [id number]  MRN: [id number] Physician: Gar Orf DOB/Age [birth year] (Age: 67) Gender: F Collected Date: 11/18/2022 Received Date: 11/18/2022  FINAL DIAGNOSIS  1. 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       Diagnosis Note : The carcinoma is positive for cytokeratin 7, PAX8, WT1, and ER.      Cytokeratin 20 and GATA3 are negative.  P53 is overexpressed.  The morphologic      findings in conjunction with the pattern of immunohistochemistry support the      above diagnosis.  Stain controls worked appropriately.  Dr. Babara was notified on      11/20/2022. This case underwent intradepartmental consultation and Dr. Jordan      concurs with the in terpretation.      There is sufficient material for ancillary molecular testing (block 1A).      DATE SIGNED OUT: 11/20/2022 ELECTRONIC SIGNATURE : Janel Md, Rexene , Pathologist, Electronic Signature  MICROSCOPIC DESCRIPTION  CASE COMMENTS STAINS USED IN DIAGNOSIS: H&E H&E H&E Universal Negative Control-DAB Stains used in diagnosis 1 CK-7, 1 CK 20, 1 ER - NOACIS, 1 GATA-3, 1 PAX 8, 1 Wilms' Tumor -1, 1 P53 Some of these immunohistochemical stains may have been developed and the performance characteristics determined by [facility].  Some may not have been cleared or approved by the U.S. Food and Drug Administration.  The FDA has determined that such clearance or approval is not necessary.  This test is used for clinical purposes.  It should not be regarded as investigational or for research.  This laboratory is certified under the Clinical Laboratory Improvement Amendments of 1988 (CLIA-88) as qualified to perform high complexity clinical labora  tory testing. IHC stains are performed on formalin fixed,paraffin embedded tissue using a 3,3diaminobenzidine (DAB) chromogen and Leica Bond Autostainer System. The staining intensity of the nucleus is score manually and is reported as the percentage of tumor cell nuclei demonstrating specific nuclear staining. The specimens are fixed in 10% Neutral Formalin for at least 6 hours and up to 72hrs. These tests are validated on decalcified tissue. Results should be interpreted with caution given the possibility of false negative results on decalcified specimens. Antibody Clones are as follows ER-clone 35F, PR-clone 16, Ki67- clone MM1.Some of these immunohistochemical stains may have been developed and the performance characteristics determined by [facility].    CLINICAL HISTORY  SPECIMEN(S) OBTAINED 1. Omentum, biopsy,  SPECIMEN COMMENTS: 1. Core needle biopsy 18ga x 4 passes SPECIMEN CLINICAL INFORMATION:    Gross Description 1. Received in formalin, l abeled with patient name only, are 4 gray soft tissue cores (1.0-1.8 cm in length) submitted entirely in blocks 1A-1C.      SMB      11/18/2022        Report signed out from the following location(s) [place] [facility] [address] [place], [place] [address] CLIA #: [id number]  [facility] [address] [place], [place] [address] CLIA #: [id number]   Glucose, capillary     Status: Abnormal   Collection Time: 11/18/22  9:17 AM  Result Value Ref Range   Glucose-Capillary 108 (H) 70 - 99 mg/dL    Comment: Glucose reference range applies only to samples taken after fasting for at least 8 hours.  CBC upon arrival     Status: Abnormal   Collection Time: 11/18/22  9:19 AM  Result Value Ref Range   WBC 11.0 (H) 4.0 - 10.5 K/uL   RBC 4.16 3.87 - 5.11 MIL/uL   Hemoglobin 11.7 (L) 12.0 - 15.0  g/dL   HCT 62.5 63.9 - 53.9 %   MCV 89.9 80.0 - 100.0 fL   MCH 28.1 26.0 - 34.0 pg   MCHC  31.3 30.0 - 36.0 g/dL   RDW 85.3 88.4 - 84.4 %   Platelets 504 (H) 150 - 400 K/uL   nRBC 0.0 0.0 - 0.2 %    Comment: Performed at [facility] [facility], [address] [place], [place] [address]  Protime-INR upon arrival     Status: None   Collection Time: 11/18/22  9:19 AM  Result Value Ref Range   Prothrombin Time 15.0 11.4 - 15.2 seconds   INR 1.2 0.8 - 1.2    Comment: (NOTE) INR goal varies based on device and disease states. Performed at [facility], [address] [place], [place] [address]   Retic Panel     Status: Abnormal   Collection Time: 11/21/22  8:09 AM  Result Value Ref Range   Retic Ct Pct 1.4 0.4 - 3.1 %   RBC. 3.87 3.87 - 5.11 MIL/uL   Retic Count, Absolute 54.6 19.0 - 186.0 K/uL   Immature Retic Fract 18.8 (H) 2.3 - 15.9 %   Reticulocyte Hemoglobin 28.4 >27.9 pg    Comment:           If this patient has chronic kidney disease and does not have a hemoglobinopathy a RET-He < 29 pg meets criteria for iron  deficiency per the 2016 NICE guidelines.    Refer to specific guidelines to determine the appropriate thresholds for treating CKD- associated iron  deficiency. TSAT and ferritin should be used in patients with hemoglobinopathies (e.g. thalassemia). Performed at [facility], [address] [place], [place] [address]   CA 125     Status: Abnormal   Collection Time: 11/21/22  8:09 AM  Result Value Ref Range   Cancer Antigen (CA) 125 944.0 (H) 0.0 - 38.1 U/mL    Comment: (NOTE) Roche [employer]ostics Electrochemiluminescence Immunoassay (ECLIA) Values obtained with different assay methods or kits cannot be used interchangeably.  Results cannot be interpreted as absolute evidence of the presence or absence of malignant disease. Performed At: [facility] [address] [place], [place] [id number] Jennette Shorter MD Ey:[id number]   CMP (Cancer Center only)     Status: Abnormal   Collection Time: 11/21/22  8:09 AM  Result Value  Ref Range   Sodium 136 135 - 145 mmol/L   Potassium 4.6 3.5 - 5.1 mmol/L   Chloride 103 98 - 111 mmol/L   CO2 24 22 - 32 mmol/L   Glucose, Bld 121 (H) 70 - 99 mg/dL    Comment: Glucose reference range applies only to samples taken after fasting for at least 8 hours.   BUN 25 (H) 6 - 20 mg/dL   Creatinine 8.98 (H) 9.55 - 1.00 mg/dL   Calcium 8.8 (L) 8.9 - 10.3 mg/dL   Total Protein 7.4 6.5 - 8.1 g/dL   Albumin 3.0 (L) 3.5 - 5.0 g/dL   AST 16 15 - 41 U/L   ALT 12 0 - 44 U/L   Alkaline Phosphatase 43 38 - 126 U/L   Total Bilirubin 0.2 <1.2 mg/dL   GFR, Estimated >39 >39 mL/min    Comment: (NOTE) Calculated using the CKD-EPI Creatinine Equation (2021)    Anion gap 9 5 - 15    Comment: Performed at [facility], [address] [place], [place] [address]  CBC with Differential (Cancer Center Only)  Status: Abnormal   Collection Time: 11/21/22  8:09 AM  Result Value Ref Range   WBC Count 9.9 4.0 - 10.5 K/uL   RBC 3.86 (L) 3.87 - 5.11 MIL/uL   Hemoglobin 10.8 (L) 12.0 - 15.0 g/dL   HCT 65.2 (L) 63.9 - 53.9 %   MCV 89.9 80.0 - 100.0 fL   MCH 28.0 26.0 - 34.0 pg   MCHC 31.1 30.0 - 36.0 g/dL   RDW 85.5 88.4 - 84.4 %   Platelet Count 431 (H) 150 - 400 K/uL   nRBC 0.0 0.0 - 0.2 %   Neutrophils Relative % 58 %   Neutro Abs 5.9 1.7 - 7.7 K/uL   Lymphocytes Relative 25 %   Lymphs Abs 2.5 0.7 - 4.0 K/uL   Monocytes Relative 8 %   Monocytes Absolute 0.7 0.1 - 1.0 K/uL   Eosinophils Relative 7 %   Eosinophils Absolute 0.7 (H) 0.0 - 0.5 K/uL   Basophils Relative 1 %   Basophils Absolute 0.1 0.0 - 0.1 K/uL   Immature Granulocytes 1 %   Abs Immature Granulocytes 0.05 0.00 - 0.07 K/uL    Comment: Performed at [facility], [address] [place], [place] [address]  CBC with Differential (Cancer Center Only)     Status: Abnormal   Collection Time: 11/28/22 12:59 PM  Result Value Ref Range   WBC Count 5.3 4.0 - 10.5 K/uL   RBC 3.55 (L) 3.87 - 5.11 MIL/uL   Hemoglobin  10.0 (L) 12.0 - 15.0 g/dL   HCT 68.1 (L) 63.9 - 53.9 %   MCV 89.6 80.0 - 100.0 fL   MCH 28.2 26.0 - 34.0 pg   MCHC 31.4 30.0 - 36.0 g/dL   RDW 85.6 88.4 - 84.4 %   Platelet Count 347 150 - 400 K/uL   nRBC 0.0 0.0 - 0.2 %   Neutrophils Relative % 46 %   Neutro Abs 2.5 1.7 - 7.7 K/uL   Lymphocytes Relative 37 %   Lymphs Abs 2.0 0.7 - 4.0 K/uL   Monocytes Relative 6 %   Monocytes Absolute 0.3 0.1 - 1.0 K/uL   Eosinophils Relative 9 %   Eosinophils Absolute 0.5 0.0 - 0.5 K/uL   Basophils Relative 1 %   Basophils Absolute 0.1 0.0 - 0.1 K/uL   Immature Granulocytes 1 %   Abs Immature Granulocytes 0.03 0.00 - 0.07 K/uL    Comment: Performed at [facility], [address] [place], [place] [address]  CMP (Cancer Center only)     Status: Abnormal   Collection Time: 11/28/22 12:59 PM  Result Value Ref Range   Sodium 138 135 - 145 mmol/L   Potassium 4.7 3.5 - 5.1 mmol/L   Chloride 101 98 - 111 mmol/L   CO2 26 22 - 32 mmol/L   Glucose, Bld 123 (H) 70 - 99 mg/dL    Comment: Glucose reference range applies only to samples taken after fasting for at least 8 hours.   BUN 25 (H) 6 - 20 mg/dL   Creatinine 9.25 9.55 - 1.00 mg/dL   Calcium 9.1 8.9 - 89.6 mg/dL   Total Protein 7.2 6.5 - 8.1 g/dL   Albumin 3.2 (L) 3.5 - 5.0 g/dL   AST 22 15 - 41 U/L   ALT 14 0 - 44 U/L   Alkaline Phosphatase 40 38 - 126 U/L   Total Bilirubin 0.4 <1.2 mg/dL   GFR, Estimated >39 >39 mL/min    Comment: (NOTE)  Calculated using the CKD-EPI Creatinine Equation (2021)    Anion gap 11 5 - 15    Comment: Performed at [facility], [address] [place], [place] [address]  CMP (Cancer Center only)     Status: Abnormal   Collection Time: 12/13/22  8:08 AM  Result Value Ref Range   Sodium 137 135 - 145 mmol/L   Potassium 4.0 3.5 - 5.1 mmol/L   Chloride 101 98 - 111 mmol/L   CO2 27 22 - 32 mmol/L   Glucose, Bld 136 (H) 70 - 99 mg/dL    Comment: Glucose reference range applies only to samples taken  after fasting for at least 8 hours.   BUN 21 (H) 6 - 20 mg/dL   Creatinine 9.29 9.55 - 1.00 mg/dL   Calcium 9.2 8.9 - 89.6 mg/dL   Total Protein 7.5 6.5 - 8.1 g/dL   Albumin 3.3 (L) 3.5 - 5.0 g/dL   AST 15 15 - 41 U/L   ALT 15 0 - 44 U/L   Alkaline Phosphatase 45 38 - 126 U/L   Total Bilirubin 0.2 <1.2 mg/dL   GFR, Estimated >39 >39 mL/min    Comment: (NOTE) Calculated using the CKD-EPI Creatinine Equation (2021)    Anion gap 9 5 - 15    Comment: Performed at [facility], [address] [place], [place] [address]  CBC with Differential (Cancer Center Only)     Status: Abnormal   Collection Time: 12/13/22  8:08 AM  Result Value Ref Range   WBC Count 7.1 4.0 - 10.5 K/uL   RBC 3.43 (L) 3.87 - 5.11 MIL/uL   Hemoglobin 9.7 (L) 12.0 - 15.0 g/dL   HCT 69.5 (L) 63.9 - 53.9 %   MCV 88.6 80.0 - 100.0 fL   MCH 28.3 26.0 - 34.0 pg   MCHC 31.9 30.0 - 36.0 g/dL   RDW 85.1 88.4 - 84.4 %   Platelet Count 391 150 - 400 K/uL   nRBC 0.0 0.0 - 0.2 %   Neutrophils Relative % 58 %   Neutro Abs 4.1 1.7 - 7.7 K/uL   Lymphocytes Relative 30 %   Lymphs Abs 2.1 0.7 - 4.0 K/uL   Monocytes Relative 8 %   Monocytes Absolute 0.6 0.1 - 1.0 K/uL   Eosinophils Relative 2 %   Eosinophils Absolute 0.2 0.0 - 0.5 K/uL   Basophils Relative 1 %   Basophils Absolute 0.1 0.0 - 0.1 K/uL   Immature Granulocytes 1 %   Abs Immature Granulocytes 0.04 0.00 - 0.07 K/uL    Comment: Performed at [facility], [address] [place], [place] [address]  CA 125     Status: Abnormal   Collection Time: 12/13/22  8:08 AM  Result Value Ref Range   Cancer Antigen (CA) 125 332.0 (H) 0.0 - 38.1 U/mL    Comment: (NOTE) Roche [employer]ostics Electrochemiluminescence Immunoassay (ECLIA) Values obtained with different assay methods or kits cannot be used interchangeably.  Results cannot be interpreted as absolute evidence of the presence or absence of malignant disease. Performed At: [facility] [address] [place], [place] [id number] Jennette Shorter MD Ey:[id number]   Ferritin     Status: None   Collection Time: 01/03/23  8:32 AM  Result Value Ref Range   Ferritin 99 11 - 307 ng/mL    Comment: Performed at [facility], [address] [place] [address] [place], [place] [address]  Iron  and TIBC     Status: Abnormal  Collection Time: 01/03/23  8:32 AM  Result Value Ref Range   Iron  152 28 - 170 ug/dL   TIBC 659 749 - 549 ug/dL   Saturation Ratios 45 (H) 10.4 - 31.8 %   UIBC 188 ug/dL    Comment: Performed at [facility], [address] [place], [place] [address]  CA 125     Status: Abnormal   Collection Time: 01/03/23  8:32 AM  Result Value Ref Range   Cancer Antigen (CA) 125 53.2 (H) 0.0 - 38.1 U/mL    Comment: (NOTE) Roche [employer]ostics Electrochemiluminescence Immunoassay (ECLIA) Values obtained with different assay methods or kits cannot be used interchangeably.  Results cannot be interpreted as absolute evidence of the presence or absence of malignant disease. Performed At: [facility] [address] [place], [place] [id number] Jennette Shorter MD Ey:[id number]   CBC with Differential (Cancer Center Only)     Status: Abnormal   Collection Time: 01/03/23  8:32 AM  Result Value Ref Range   WBC Count 4.9 4.0 - 10.5 K/uL   RBC 3.48 (L) 3.87 - 5.11 MIL/uL   Hemoglobin 9.8 (L) 12.0 - 15.0 g/dL   HCT 69.2 (L) 63.9 - 53.9 %   MCV 88.2 80.0 - 100.0 fL   MCH 28.2 26.0 - 34.0 pg   MCHC 31.9 30.0 - 36.0 g/dL   RDW 83.2 (H) 88.4 - 84.4 %   Platelet Count 176 150 - 400 K/uL   nRBC 0.0 0.0 - 0.2 %   Neutrophils Relative % 47 %   Neutro Abs 2.3 1.7 - 7.7 K/uL   Lymphocytes Relative 41 %   Lymphs Abs 2.0 0.7 - 4.0 K/uL   Monocytes Relative 9 %   Monocytes Absolute 0.5 0.1 - 1.0 K/uL   Eosinophils Relative 2 %   Eosinophils Absolute 0.1 0.0 - 0.5 K/uL   Basophils Relative 1 %   Basophils Absolute 0.0 0.0 - 0.1 K/uL   Immature Granulocytes 0 %   Abs Immature  Granulocytes 0.02 0.00 - 0.07 K/uL    Comment: Performed at [facility], [address] [place], [place] [address]  CMP (Cancer Center only)     Status: Abnormal   Collection Time: 01/03/23  8:32 AM  Result Value Ref Range   Sodium 136 135 - 145 mmol/L   Potassium 4.4 3.5 - 5.1 mmol/L   Chloride 100 98 - 111 mmol/L   CO2 27 22 - 32 mmol/L   Glucose, Bld 305 (H) 70 - 99 mg/dL    Comment: Glucose reference range applies only to samples taken after fasting for at least 8 hours.   BUN 24 (H) 6 - 20 mg/dL   Creatinine 9.24 9.55 - 1.00 mg/dL   Calcium 9.1 8.9 - 89.6 mg/dL   Total Protein 7.5 6.5 - 8.1 g/dL   Albumin 3.4 (L) 3.5 - 5.0 g/dL   AST 16 15 - 41 U/L   ALT 18 0 - 44 U/L   Alkaline Phosphatase 41 38 - 126 U/L   Total Bilirubin 0.3 <1.2 mg/dL   GFR, Estimated >39 >39 mL/min    Comment: (NOTE) Calculated using the CKD-EPI Creatinine Equation (2021)    Anion gap 9 5 - 15    Comment: Performed at [facility], [address] [place], [place] [address]  POCT Glucose (CBG)     Status: Normal   Collection Time: 01/09/23  3:15 PM  Result Value Ref Range   POC Glucose 84 70 -  99 mg/dl       Assessment & Plan:   Problem List Items Addressed This Visit       Cardiovascular and Mediastinum   Migraine without aura and without status migrainosus, not intractable   Sending RX for patient for Ubrelvy .  This has helped previously, but she was unable to get this with her previous insurance.  Will get PA completed so she can get asap.  Reassess at follow up.       Relevant Medications   lisinopril  (ZESTRIL ) 10 MG tablet   Ubrogepant  (UBRELVY ) 100 MG TABS   Takotsubo cardiomyopathy (Chronic)   Will set up with cardiology as she is looking for a new cardiologist.   Continue current therapy.        Relevant Medications   lisinopril  (ZESTRIL ) 10 MG tablet     Endocrine   Diabetes mellitus, type 2 (HCC) - Primary (Chronic)   Will call pt. With  results  Continue current diabetes POC, as patient has been well controlled on current regimen.  Will adjust meds if needed based on labs.       Relevant Medications   lisinopril  (ZESTRIL ) 10 MG tablet   Other Relevant Orders   POCT Glucose (CBG) (Completed)     Other   Obesity, Class III, BMI 40-49.9 (morbid obesity) (HCC)   Continue current meds.  Will adjust as needed based on results.  The patient is asked to make an attempt to improve diet and exercise patterns to aid in medical management of this problem. Addressed importance of increasing and maintaining water  intake.         Return in about 3 months (around 04/09/2023) for F/U.   Total time spent: 30 minutes  ALAN CHRISTELLA ARRANT, FNP  01/09/2023   This document may have been prepared by [facility] Voice Recognition software and as such may include unintentional dictation errors.

## 2023-01-12 ENCOUNTER — Encounter: Payer: Self-pay | Admitting: Family

## 2023-01-12 NOTE — Assessment & Plan Note (Signed)
 Will set up with cardiology as she is looking for a new cardiologist.   Continue current therapy.

## 2023-01-12 NOTE — Assessment & Plan Note (Signed)
 Sending RX for patient for [employer]  This has helped previously, but she was unable to get this with her previous insurance.  Will get PA completed so she can get asap.  Reassess at follow up.

## 2023-01-12 NOTE — Assessment & Plan Note (Signed)
 Continue current meds.  Will adjust as needed based on results.  The patient is asked to make an attempt to improve diet and exercise patterns to aid in medical management of this problem. Addressed importance of increasing and maintaining water intake.

## 2023-01-12 NOTE — Assessment & Plan Note (Addendum)
 Holding [place], as her oncologist has asked that she not lose weight due to anything else at this point.   Continue current diabetes POC, as patient has been well controlled on current regimen.  Will adjust meds if needed based on labs.

## 2023-01-13 ENCOUNTER — Ambulatory Visit
Admission: RE | Admit: 2023-01-13 | Discharge: 2023-01-13 | Disposition: A | Discharge status: Home / Self Care | Payer: 59 | Source: Ambulatory Visit | Attending: Oncology | Admitting: Oncology

## 2023-01-13 ENCOUNTER — Telehealth: Payer: Self-pay | Admitting: Plastic Surgery

## 2023-01-13 DIAGNOSIS — J90 Pleural effusion, not elsewhere classified: Secondary | ICD-10-CM | POA: Diagnosis not present

## 2023-01-13 DIAGNOSIS — R188 Other ascites: Secondary | ICD-10-CM | POA: Diagnosis not present

## 2023-01-13 DIAGNOSIS — N200 Calculus of kidney: Secondary | ICD-10-CM | POA: Diagnosis not present

## 2023-01-13 DIAGNOSIS — C482 Malignant neoplasm of peritoneum, unspecified: Secondary | ICD-10-CM | POA: Diagnosis not present

## 2023-01-13 DIAGNOSIS — C786 Secondary malignant neoplasm of retroperitoneum and peritoneum: Secondary | ICD-10-CM | POA: Diagnosis not present

## 2023-01-13 MED ORDER — IOHEXOL 300 MG/ML  SOLN
100.0000 mL | Freq: Once | INTRAMUSCULAR | Status: AC | PRN
  Administered 2023-01-13: 100 mL via INTRAVENOUS

## 2023-01-13 MED ORDER — HEPARIN SOD (PORK) LOCK FLUSH 100 UNIT/ML IV SOLN
500.0000 [IU] | Freq: Once | INTRAVENOUS | Status: AC
  Administered 2023-01-13: 500 [IU] via INTRAVENOUS
  Filled 2023-01-13: qty 5

## 2023-01-13 MED ORDER — HEPARIN SOD (PORK) LOCK FLUSH 100 UNIT/ML IV SOLN
INTRAVENOUS | Status: AC
Start: 2023-01-13 — End: ?
  Filled 2023-01-13: qty 5

## 2023-01-13 NOTE — Telephone Encounter (Signed)
 Pt has never paid for her FMLA paperwork, we have called and sent mychart messages. Forms have been shredded. Pt sx was April 2024

## 2023-01-15 ENCOUNTER — Encounter: Payer: Self-pay | Admitting: Obstetrics and Gynecology

## 2023-01-15 ENCOUNTER — Telehealth: Payer: Self-pay | Admitting: Family

## 2023-01-15 ENCOUNTER — Inpatient Hospital Stay: Payer: 59 | Attending: Oncology | Admitting: Obstetrics and Gynecology

## 2023-01-15 VITALS — BP 165/74 | HR 83 | Resp 19 | Wt 256.7 lb

## 2023-01-15 DIAGNOSIS — Z89512 Acquired absence of left leg below knee: Secondary | ICD-10-CM | POA: Insufficient documentation

## 2023-01-15 DIAGNOSIS — R971 Elevated cancer antigen 125 [CA 125]: Secondary | ICD-10-CM | POA: Insufficient documentation

## 2023-01-15 DIAGNOSIS — Z1509 Genetic susceptibility to other malignant neoplasm: Secondary | ICD-10-CM

## 2023-01-15 DIAGNOSIS — E119 Type 2 diabetes mellitus without complications: Secondary | ICD-10-CM | POA: Insufficient documentation

## 2023-01-15 DIAGNOSIS — Z6837 Body mass index (BMI) 37.0-37.9, adult: Secondary | ICD-10-CM | POA: Insufficient documentation

## 2023-01-15 DIAGNOSIS — Z9221 Personal history of antineoplastic chemotherapy: Secondary | ICD-10-CM | POA: Diagnosis not present

## 2023-01-15 DIAGNOSIS — Z1502 Genetic susceptibility to malignant neoplasm of ovary: Secondary | ICD-10-CM

## 2023-01-15 DIAGNOSIS — Z7985 Long-term (current) use of injectable non-insulin antidiabetic drugs: Secondary | ICD-10-CM | POA: Diagnosis not present

## 2023-01-15 DIAGNOSIS — C482 Malignant neoplasm of peritoneum, unspecified: Secondary | ICD-10-CM | POA: Diagnosis not present

## 2023-01-15 DIAGNOSIS — Z148 Genetic carrier of other disease: Secondary | ICD-10-CM | POA: Diagnosis not present

## 2023-01-15 DIAGNOSIS — Z01810 Encounter for preprocedural cardiovascular examination: Secondary | ICD-10-CM

## 2023-01-15 DIAGNOSIS — I429 Cardiomyopathy, unspecified: Secondary | ICD-10-CM | POA: Insufficient documentation

## 2023-01-15 DIAGNOSIS — Z5111 Encounter for antineoplastic chemotherapy: Secondary | ICD-10-CM | POA: Insufficient documentation

## 2023-01-15 DIAGNOSIS — Z1589 Genetic susceptibility to other disease: Secondary | ICD-10-CM

## 2023-01-15 DIAGNOSIS — Z7189 Other specified counseling: Secondary | ICD-10-CM

## 2023-01-15 NOTE — Telephone Encounter (Signed)
 Patient left VM to see if we can get her started on Mounjaro so we can get her A1c down. The OBGYN wants to do her surgery 2/18 but has to get her blood sugar down by then. They will be checking her A1c before then. Please send the [facility].

## 2023-01-15 NOTE — Progress Notes (Signed)
 Gynecologic Oncology Consult Visit   Referring Provider:  Dr. Babara  Chief Concern: malignant ascites concerning for primary peritoneal cancer  Subjective:  Nancy [name] is a 58 y.o. G0 female who is seen in consultation from Dr.    Patient has now completed 3 cycles of neoadjuvant carboplatin  and taxol  (175 mg/m2). Her 3rd cycle was on 01/03/23.    CA 125 10/26/22 762  11/21/22- 944 12/13/22-  332 01/03/23-  53.2  01/13/2023- CT C/A/P w/ contrast - decreasing ascites, trace. Omental caking and nodularity along perineum decreasing as well with significant residual particularly in the upper anterior abdomen.  - stable small mesenteric and retroperitoneal nodes - resolved pleural effusion - small left renal angiomyolipoma and upper pole nonobstructing renal stone.   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 The report date is 11/25/2022.   The CancerNext-Expanded + RNAinsight gene panel offered by [employer] and includes sequencing and rearrangement analysis for the following 71 genes: AIP, ALK, APC*, ATM*, AXIN2, BAP1, BARD1, BMPR1A, BRCA1*, BRCA2*, BRIP1*, CDC73, CDH1*,CDK4, CDKN1B, CDKN2A, CHEK2*, CTNNA1, DICER1, FH, FLCN, KIF1B, LZTR1, MAX, MEN1, MET, MLH1*, MSH2*, MSH3, MSH6*, MUTYH*, NF1*, NF2, NTHL1, PALB2*, PHOX2B, PMS2*, POT1, PRKAR1A, PTCH1, PTEN*, RAD51C*, RAD51D*,RB1, RET, SDHA, SDHAF2, SDHB, SDHC, SDHD, SMAD4, SMARCA4, SMARCB1, SMARCE1, STK11, SUFU, TMEM127, TP53*,TSC1, TSC2, VHL; EGFR, EGLN1, HOXB13, KIT, MITF, PDGFRA, POLD1 and POLE (sequencing only); EPCAM and GREM1 (deletion/duplication only).    Myriad HRD positive. BRCA1/BRCA2 negative.   Gynecologic Oncology History:  Presented from Dr. 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  Discharged from [facility] last week.    [name] is a 58 y.o. female who presented with abdominal pain  with CT showing large volume ascites and omental caking concerning for peritoneal carcinomatosis.  Patient has past medical history significant for type 2 diabetes, CAD, asthma, chronic pain, diabetic foot infection, GERD, hyperlipidemia, Takotsubo cardiomyopathy hospitalized from 9/11 to 09/30/2022 with sepsis secondary to diabetic foot infection/osteomyelitis s/p left BKA 9/17, also found to have Staph aureus bacteremia with TEE negative for vegetations, discharged home with 2 weeks of IV cefazolin , completed a few days ago, who presents to the ED with a 6-day history of nonbloody nonbilious vomiting Associated with generalized abdominal pain and distention.  States during her recent past hospitalization she initially presented with bleeding from her rectum but it spontaneously resolved.  She had abdominal pain and nausea but she attributed it to her foot infection.  She denies cough, chest pain, shortness of breath, leg pain, black or bloody stool except for one-time episode prior to her last hospitalization and denies, dysuria.  Hospital Course: Patient presents with abdominal pain/distention, found to have malignant ascites. Received a second therapeutic paracentesis on 10/21. Oncology following, suspects gyn primary. Final path report adenocarcinoma, but insufficient material for IHC studies. Mild aki resolved and patient tolerating a diet. PT evaluated and deemed safe for discharge.  10/26/22 CA125 =762, CEA and CA19-9 normal  Maternal aunt with ovarian cancer.  No breast cancer in the family.  Mammogram normal 2021, no prior colonoscopy. Had an episode of rectal bleeding, but none now.  Problem List: Patient Active Problem List   Diagnosis Date Noted   Blurring of vision 12/13/2022   Chemotherapy-induced nausea 12/13/2022   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   Encounter for antineoplastic chemotherapy 11/21/2022   Neoplasm related pain 11/21/2022   Primary peritoneal  carcinomatosis (HCC) 11/06/2022  Protein calorie malnutrition (HCC) 11/06/2022   IDA (iron  deficiency anemia) 11/06/2022   Normocytic anemia 10/28/2022   Malignant ascites 10/23/2022   Hypotension 10/22/2022   Intractable vomiting 10/22/2022   S/P left  BKA 09/24/22 secondary to osteomyelitis (below knee amputation) (HCC) 10/22/2022   Diarrhea 10/22/2022   Adjustment disorder with mixed anxiety and depressed mood 09/26/2022   B12 deficiency due to diet 05/16/2022   Vitamin D  deficiency, unspecified 05/16/2022   Atherosclerosis of native arteries of the extremities with ulceration (HCC) 01/30/2021   Diabetes mellitus, type 2 (HCC) 03/13/2015   Allergic rhinitis 05/10/2014   Asthma without status asthmaticus 05/10/2014   Atherosclerosis of native coronary artery of native heart with angina pectoris (HCC) 05/10/2014   Clinical depression 05/10/2014   Neuropathy, diabetic (HCC) 05/10/2014   Acid reflux 05/10/2014   Mixed hyperlipidemia 05/10/2014   Migraine without aura and without status migrainosus, not intractable 05/10/2014   Takotsubo cardiomyopathy 05/10/2014   Obesity, Class III, BMI 40-49.9 (morbid obesity) (HCC) 05/10/2014   Essential hypertension 08/09/2008   Heart failure (HCC) 06/06/2008    Past Medical History: Past Medical History:  Diagnosis Date   Abnormal cardiovascular stress test 09/21/2018   Formatting of this note might be different from the original. Lexiscan Myoview 09/16/2018 revealed mild anterior ischemia   Adverse effect of motion 05/10/2014   AKI (acute kidney injury) (HCC) 10/22/2022   Anxiety    Arthritis    r knee   Asthma    Breast cyst 05/10/2014   CAD (coronary artery disease)    a.) LHC 06/04/2009 at [facility]; non-obstructive CAD. b.) CTA with FFR 10/08/2018: extensive mixed plaque proximal to mid LAD (51-69%); Coronary Ca score 217; FFR 0.71 dPDA, 0.86 mLAD, 0.87 dLCx.   CCF (congestive cardiac failure) (HCC) 06/06/2008   a.) 30% EF. b.) TTE  06/03/2011: EF >55%; triv MR. c.) TTE 04/27/2018: EF 55%; mild LVH; triv PR, mild MR/TR; G1DD.   Cellulitis of left lower extremity 09/18/2022   Chest pain with high risk for cardiac etiology 07/02/2016   Chronic use of opiate drug for therapeutic purpose 02/13/2022   Formatting of this note might be different from the original. [facility] Pain Contract signed on 04/18/16 & updated 03/04/17; UDS done on 04/18/16.   Complication of anesthesia    Diabetic foot infection (HCC) 03/13/2015   Diabetic foot ulcer (HCC) 02/08/2021   Diabetic ulcer of left heel associated with type 2 diabetes mellitus (HCC) 02/08/2021   Eczema    Family history of adverse reaction to anesthesia    a.) PONV in mother and grandmother   Gas gangrene (HCC) 09/18/2022   GERD (gastroesophageal reflux disease) 05/10/2014   History of kidney stones    History of MSSA bacteremia due to osteomyelitis L foot 09/18/22 10/22/2022   HLD (hyperlipidemia)    Hyponatremia 03/13/2015   Migraines    Motion sickness    MSSA bacteremia 09/20/2022   Osteomyelitis (HCC) 09/18/2022   Other acute osteomyelitis, left ankle and foot (HCC) 02/13/2022   Panic attacks    Peripheral edema 04/27/2018   Pneumonia    PONV (postoperative nausea and vomiting)    Sepsis secondary to diabetic foot infection 03/13/2015   Sepsis secondary to diabetic foot infection 03/13/2015   T2DM (type 2 diabetes mellitus) (HCC)    Takotsubo cardiomyopathy / transient apical balooning syndrome / stress-induced cardiomyopathy 05/10/2014   Unspecified essential hypertension     Past Surgical History: Past Surgical History:  Procedure Laterality Date   AMPUTATION Left  09/19/2022   Procedure: AMPUTATION BELOW KNEE;  Surgeon: Jama Cordella MATSU, MD;  Location: [facility];  Service: Vascular;  Laterality: Left;   AMPUTATION Left 09/24/2022   Procedure: AMPUTATION BELOW KNEE WITH WOUND CLOSURE;  Surgeon: Jama Cordella MATSU, MD;  Location: [facility];  Service: Vascular;   Laterality: Left;   AMPUTATION TOE Left 03/16/2015   Procedure: left fifth toe amputation with incision and drainage;  Surgeon: Eva Gay, DPM;  Location: [facility];  Service: Podiatry;  Laterality: Left;   APPLICATION OF WOUND VAC Left 02/14/2021   Procedure: APPLICATION OF WOUND VAC;  Surgeon: Gay Eva, DPM;  Location: [facility];  Service: Podiatry;  Laterality: Left;   APPLICATION OF WOUND VAC Left 04/16/2021   Procedure: APPLICATION OF WOUND VAC;  Surgeon: Lowery Estefana RAMAN, DO;  Location: [facility];  Service: Plastics;  Laterality: Left;   CARDIAC CATHETERIZATION Left 06/04/2009   Procedure: CARDIAC CATHETERIZATION; Location: [facility]   CHOLECYSTECTOMY     DEBRIDEMENT AND CLOSURE WOUND Left 04/16/2021   Procedure: DEBRIDEMENT AND CLOSURE WOUND;  Surgeon: Lowery Estefana RAMAN, DO;  Location: [facility];  Service: Plastics;  Laterality: Left;  1 hour   IR IMAGING GUIDED PORT INSERTION  11/18/2022   IRRIGATION AND DEBRIDEMENT FOOT Left 02/08/2021   Procedure: IRRIGATION AND DEBRIDEMENT FOOT - LFT HEEL ULCER;  Surgeon: Gay Eva, DPM;  Location: [facility];  Service: Podiatry;  Laterality: Left;   IRRIGATION AND DEBRIDEMENT FOOT Left 02/14/2021   Procedure: IRRIGATION AND DEBRIDEMENT LEFT HEEL;  Surgeon: Gay Eva, DPM;  Location: [facility];  Service: Podiatry;  Laterality: Left;   kidney stone removal     KNEE ARTHROSCOPY W/ MENISCAL REPAIR     NOSE SURGERY  01/07/1989   due to fracture   TEE WITHOUT CARDIOVERSION N/A 09/26/2022   Procedure: TRANSESOPHAGEAL ECHOCARDIOGRAM;  Surgeon: Dewane Shiner, DO;  Location: [facility];  Service: Cardiovascular;  Laterality: N/A;   TOE AMPUTATION     second toe   TONSILLECTOMY     TOTAL KNEE ARTHROPLASTY  01/11/2011   Procedure: TOTAL KNEE ARTHROPLASTY;  Surgeon: Norleen LITTIE Gavel;  Location: [facility];  Service: Orthopedics;  Laterality: Right;  COMPUTER ASSISTED TOTAL KNEE REPLACEMENT       OB  History:  OB History  Gravida Para Term Preterm AB Living  0 0 0 0 0 0  SAB IAB Ectopic Multiple Live Births  0 0 0 0 0    Family History: Family History  Problem Relation Age of Onset   Transient ischemic attack Mother    Asthma Mother    Hypertension Mother    Lung cancer Father 61   Hypertension Father    Asthma Sister    Anxiety disorder Sister    Asthma Sister    Hypertension Brother    Ovarian cancer Maternal Aunt 46   Melanoma Maternal Uncle        dx 60s   Lung cancer Paternal Aunt 62   Stomach cancer Maternal Grandmother    Colon cancer Maternal Grandmother    Lymphoma Maternal Grandmother    Melanoma Maternal Grandfather 72       sun exposure   Lung cancer Paternal Grandmother 32   Kidney cancer Paternal Grandfather        dx early   Crohn's disease Other    GER disease Other    Breast cancer Neg Hx     Social History: Social History   Socioeconomic History   Marital  status: Married    Spouse name: Not on file   Number of children: Not on file   Years of education: Not on file   Highest education level: Not on file  Occupational History   Not on file  Tobacco Use   Smoking status: Former    Current packs/day: 0.00    Types: Cigarettes    Quit date: 03/12/1985    Years since quitting: 37.8   Smokeless tobacco: Never  Vaping Use   Vaping status: Never Used  Substance and Sexual Activity   Alcohol use: No   Drug use: No   Sexual activity: Yes    Birth control/protection: Post-menopausal  Other Topics Concern   Not on file  Social History Narrative   Wife, Barnie, at bedside. One indoor pet, dog.   Social Drivers of Corporate Investment Banker Strain: Not on file  Food Insecurity: No Food Insecurity (10/22/2022)   Hunger Vital Sign    Worried About Running Out of Food in the Last Year: Never true    Ran Out of Food in the Last Year: Never true  Transportation Needs: No Transportation Needs (10/22/2022)   PRAPARE - Doctor, General Practice (Medical): No    Lack of Transportation (Non-Medical): No  Physical Activity: Not on file  Stress: Not on file  Social Connections: Not on file  Intimate Partner Violence: Not At Risk (10/22/2022)   Humiliation, Afraid, Rape, and Kick questionnaire    Fear of Current or Ex-Partner: No    Emotionally Abused: No    Physically Abused: No    Sexually Abused: No    Allergies: Allergies  Allergen Reactions   Shellfish Allergy Anaphylaxis   Codeine Other (See Comments)    Migraine  Other Reaction(s): Other (See Comments)  Reaction:  Severe migraines   Morphine  And Codeine Other (See Comments)    Reaction:  Severe migraines    Rosuvastatin     Other reaction(s): Muscle Pain   Percocet [Oxycodone -Acetaminophen ] Nausea And Vomiting   Sulfa Antibiotics Rash   Theophyllines Itching and Rash    Current Medications: Current Outpatient Medications  Medication Sig Dispense Refill   acidophilus (RISAQUAD) CAPS capsule Take 1 capsule by mouth daily.     albuterol  (PROVENTIL  HFA;VENTOLIN  HFA) 108 (90 Base) MCG/ACT inhaler Inhale 2 puffs into the lungs every 4 (four) hours as needed for wheezing or shortness of breath. Reported on 03/29/2015 1 Inhaler 3   albuterol  (PROVENTIL ) (2.5 MG/3ML) 0.083% nebulizer solution Take 2.5 mg by nebulization as needed.     ALPRAZolam  (XANAX ) 0.5 MG tablet TAKE 1/2 TO 1 TABLET BY MOUTH AS NEEDED FOR PANIC ATTACKS 30 tablet 2   apixaban  (ELIQUIS ) 2.5 MG TABS tablet Take 1 tablet (2.5 mg total) by mouth 2 (two) times daily. 60 tablet 2   ascorbic acid  (VITAMIN C ) 500 MG tablet Take 1 tablet (500 mg total) by mouth 2 (two) times daily. 60 tablet 0   aspirin  EC 81 MG tablet Take 81 mg by mouth daily.     carvedilol  (COREG ) 6.25 MG tablet TAKE 1 TABLET BY MOUTH TWICE A DAY 180 tablet 1   cholecalciferol (VITAMIN D3) 25 MCG (1000 UNIT) tablet Take 1,000 Units by mouth daily.     Cyanocobalamin  (VITAMIN B 12) 500 MCG TABS Take by mouth daily at 8  pm.     desvenlafaxine  (PRISTIQ ) 100 MG 24 hr tablet TAKE 1 TABLET BY MOUTH EVERY DAY 90 tablet 2   dexamethasone  (  DECADRON ) 4 MG tablet Take 2 tablets (8mg ) by mouth daily starting the day after carboplatin  for 2 days. Take with food 30 tablet 1   dicyclomine  (BENTYL ) 10 MG capsule Take 1 capsule (10 mg total) by mouth 4 (four) times daily -  before meals and at bedtime. 60 capsule 1   Ensure Max Protein (ENSURE MAX PROTEIN) LIQD Take 330 mLs (11 oz total) by mouth 2 (two) times daily.     fenofibrate  54 MG tablet TAKE 1 TABLET BY MOUTH EVERY DAY 90 tablet 3   fluconazole  (DIFLUCAN ) 200 MG tablet Take 1 tablet (200 mg total) by mouth daily. 30 tablet 3   gabapentin  (NEURONTIN ) 300 MG capsule Take 1 capsule (300 mg total) by mouth at bedtime as needed. 30 capsule 3   HYDROmorphone  (DILAUDID ) 2 MG tablet Take 1 tablet (2 mg total) by mouth every 4 (four) hours as needed for severe pain (pain score 7-10). 60 tablet 0   ibuprofen (ADVIL) 800 MG tablet TAKE 1 TABLET BY MOUTH EVERY 8 HOURS AS NEEDED 90 tablet 3   insulin  NPH-regular Human (70-30) 100 UNIT/ML injection Inject 15 Units into the skin in the morning and at bedtime.     iron  polysaccharides (NIFEREX) 150 MG capsule Take 1 capsule (150 mg total) by mouth daily. 30 capsule 2   lidocaine -prilocaine  (EMLA ) cream Apply to affected area once 30 g 3   lisinopril  (ZESTRIL ) 10 MG tablet Take 1 tablet (10 mg total) by mouth every morning AND 0.5 tablets (5 mg total) at bedtime. 45 tablet 11   metFORMIN  (GLUCOPHAGE ) 500 MG tablet TAKE TWO TABLETS EACH MORNING. AND 3 TABLETS IN THE EVENING AS DIRECTED 150 tablet 5   methocarbamol  (ROBAXIN ) 500 MG tablet Take 1 tablet (500 mg total) by mouth every 8 (eight) hours as needed for muscle spasms. 30 tablet 0   mirtazapine  (REMERON ) 7.5 MG tablet Take 1 tablet (7.5 mg total) by mouth at bedtime. 30 tablet 3   montelukast  (SINGULAIR ) 10 MG tablet TAKE 1 TABLET BY MOUTH EVERYDAY AT BEDTIME 90 tablet 1   Multiple  Vitamin (MULTIVITAMIN WITH MINERALS) TABS tablet Take 1 tablet by mouth daily. 30 tablet 2   naloxone  (NARCAN ) nasal spray 4 mg/0.1 mL SPRAY 1 SPRAY INTO ONE NOSTRIL AS DIRECTED FOR OPIOID OVERDOSE (TURN PERSON ON SIDE AFTER DOSE. IF NO RESPONSE IN 2-3 MINUTES OR PERSON RESPONDS BUT RELAPSES, REPEAT USING A NEW SPRAY DEVICE AND SPRAY INTO THE OTHER NOSTRIL. CALL 911 AFTER USE.) * EMERGENCY USE ONLY * 1 each 0   nystatin  (MYCOSTATIN /NYSTOP ) powder APPLY TOPICALLY TWICE DAILY AS NEEDED 30 g 3   nystatin  ointment (MYCOSTATIN ) Apply 1 Application topically 2 (two) times daily. 30 g 0   ondansetron  (ZOFRAN ) 8 MG tablet Take 1 tablet (8 mg total) by mouth every 8 (eight) hours as needed for nausea or vomiting. Start on the third day after carboplatin . 30 tablet 1   ondansetron  (ZOFRAN -ODT) 8 MG disintegrating tablet Take 1 tablet (8 mg total) by mouth every 8 (eight) hours as needed for nausea or vomiting. 90 tablet 3   pantoprazole  (PROTONIX ) 40 MG tablet TAKE 1 TABLET BY MOUTH EVERY DAY 90 tablet 3   pravastatin  (PRAVACHOL ) 40 MG tablet TAKE 1 TABLET BY MOUTH EVERY DAY 30 tablet 5   prochlorperazine  (COMPAZINE ) 10 MG tablet Take 1 tablet (10 mg total) by mouth every 6 (six) hours as needed for nausea or vomiting. 30 tablet 1   Ubrogepant  (UBRELVY ) 100 MG TABS Take  1 tablet (100 mg total) by mouth as needed (migraine). 12 tablet 1   zinc  sulfate 220 (50 Zn) MG capsule Take 1 capsule (220 mg total) by mouth daily. 30 capsule 0   Continuous Glucose Sensor (FREESTYLE LIBRE 2 SENSOR) MISC 1 each by Does not apply route every 14 (fourteen) days. (Patient not taking: Reported on 01/03/2023) 2 each 3   No current facility-administered medications for this visit.    Review of Systems General: no complaints  HEENT: no complaints  Lungs: no complaints  Cardiac: no complaints  GI: no complaints  GU: no complaints  Musculoskeletal: no complaints  Extremities: no complaints  Skin: no complaints  Neuro: no  complaints  Endocrine: no complaints  Psych: no complaints       Objective:  Physical Examination:  BP (!) 165/74   Pulse 83   Resp 19   Wt 256 lb 11.2 oz (116.4 kg)   LMP 02/08/2013 (Within Months)   SpO2 98%   BMI 37.91 kg/m    ECOG Performance Status: 0 - Asymptomatic  GENERAL: Patient is in no acute distress HEENT:  PERRL, neck supple with midline trachea.  NODES:  No cervical, supraclavicular, axillary, or inguinal lymphadenopathy palpated.  LUNGS:  Clear to auscultation bilaterally.  No wheezes or rhonchi. HEART:  Regular rate and rhythm.  ABDOMEN:  Soft and protuberant. Mild tenderness midline to RUQ. Well healed RUQ scar. Nondistended, No ascites or masses. Large pannus. No hernia.   EXTREMITIES:  No peripheral edema.   SKIN:  Clear with no obvious rashes or skin changes.  NEURO:  Nonfocal. Well oriented.  Appropriate affect.  Pelvic: EGBUS: no lesions Cervix: no lesions, nontender, mobile Vagina: no lesions, no discharge or bleeding Uterus: normal size, nontender, mobile Adnexa: no palpable masses Rectovaginal: deferred    Lab Review   Chemistry      Component Value Date/Time   NA 136 01/03/2023 0832   NA 132 (L) 08/07/2022 1210   NA 133 (L) 08/21/2012 1523   K 4.4 01/03/2023 0832   K 4.1 08/21/2012 1523   CL 100 01/03/2023 0832   CL 102 08/21/2012 1523   CO2 27 01/03/2023 0832   CO2 26 08/21/2012 1523   BUN 24 (H) 01/03/2023 0832   BUN 23 08/07/2022 1210   BUN 17 08/21/2012 1523   CREATININE 0.75 01/03/2023 0832   CREATININE 0.69 08/21/2012 1523   GLU 231 02/07/2012 0000      Component Value Date/Time   CALCIUM 9.1 01/03/2023 0832   CALCIUM 8.8 08/21/2012 1523   ALKPHOS 41 01/03/2023 0832   ALKPHOS 73 08/21/2012 1523   AST 16 01/03/2023 0832   ALT 18 01/03/2023 0832   ALT 25 08/21/2012 1523   BILITOT 0.3 01/03/2023 0832      Lab Results  Component Value Date   WBC 4.9 01/03/2023   HGB 9.8 (L) 01/03/2023   HCT 30.7 (L) 01/03/2023    MCV 88.2 01/03/2023   PLT 176 01/03/2023    Radiologic Imaging: CT scan 10/22/22  FINDINGS: Lower chest: 6 no acute abnormality   Hepatobiliary: No focal liver abnormality is seen. Status post cholecystectomy. No biliary dilatation.   Pancreas: No focal abnormality or ductal dilatation.   Spleen: No focal abnormality.  Normal size.   Adrenals/Urinary Tract: No adrenal abnormality. No focal renal abnormality. No stones or hydronephrosis. Urinary bladder is unremarkable.   Stomach/Bowel: Stomach, large and small bowel grossly unremarkable.   Vascular/Lymphatic: Aortic atherosclerosis. No evidence of aneurysm or adenopathy.  Reproductive: Uterus and adnexa unremarkable.  No mass.   Other: Large volume ascites in the abdomen and pelvis. Omental thickening/caking noted anteriorly. Appearance is concerning for peritoneal carcinomatosis, often seen with ovarian cancer. However, no ovarian mass visualized.   Musculoskeletal: No acute bony abnormality.   IMPRESSION: Large volume ascites in the abdomen or pelvis with omental caking. Appearance is concerning for peritoneal malignancy/carcinomatosis, often seen with ovarian cancer, but no ovarian mass visualized.   Aortic atherosclerosis.   01/13/2023 Narrative & Impression  CLINICAL DATA:  Peritoneal carcinomatosis.  * Tracking Code: BO *   EXAM: CT CHEST, ABDOMEN, AND PELVIS WITH CONTRAST   TECHNIQUE: Multidetector CT imaging of the chest, abdomen and pelvis was performed following the standard protocol during bolus administration of intravenous contrast.   RADIATION DOSE REDUCTION: This exam was performed according to the departmental dose-optimization program which includes automated exposure control, adjustment of the mA and/or kV according to patient size and/or use of iterative reconstruction technique.   CONTRAST:  OMNIPAQUE  IOHEXOL  300 MG/ML  SOLN   COMPARISON:  Chest CT 11/14/2022.  Abdomen pelvis CT  10/22/2022.   FINDINGS: CT CHEST FINDINGS   Cardiovascular: Right upper chest port is accessed. Tip of the catheter extends into the right atrium. Coronary artery calcifications are seen. The heart is nonenlarged. Calcifications along the mitral valve annulus. Thoracic aorta has a normal course and caliber with some mild calcified plaque scattered.   Mediastinum/Nodes: Preserved thyroid  gland. Slightly patulous thoracic esophagus. No specific abnormal lymph node enlargement identified in the axillary regions, hilum or mediastinum.   Lungs/Pleura: Calcified right apical lung nodule identified on series 4, image 27 consistent with old granulomatous disease and is unchanged. No new dominant lung nodule identified. No consolidation, pneumothorax or effusion. Additional punctate calcification in the left lung series 4, image 98. Previous pleural effusion on the left is no longer identified. The small ground-glass nodule in the posterior right lower lobe is stable in size on image 77 but much more faint.   Musculoskeletal: There is some curvature of the spine with some degenerative changes.   CT ABDOMEN PELVIS FINDINGS   Hepatobiliary: No focal liver abnormality is seen. Status post cholecystectomy. No biliary dilatation. Patent portal vein.   Pancreas: Unremarkable. No pancreatic ductal dilatation or surrounding inflammatory changes.   Spleen: Normal in size without focal abnormality.   Adrenals/Urinary Tract: The adrenal glands are preserved. Punctate upper pole nonobstructing left-sided renal stone. There is small area of macroscopic fat as well in the posterior left mid kidney consistent with a angiomyolipoma measuring 16 mm on series 2, image 68. No separate enhancing mass. There are some tiny low-attenuation lesions elsewhere as well which are too small to completely characterize. Bosniak 2 lesions. Ureters have normal course and caliber down to the bladder. Preserved  contours of the urinary bladder.   Stomach/Bowel: Stomach and small bowel are nondilated on this non oral contrast examination large bowel as well has a normal course and caliber with diffuse colonic stool.   Vascular/Lymphatic: Normal caliber aorta and IVC. Scattered vascular calcifications. There are several small less than 1 cm size nodes identified along the retroperitoneum, nonpathologic by size criteria. More numerous than usually seen but similar in appearance of the previous examination.   Reproductive: Uterus and bilateral adnexa are unremarkable.   Other: Decreasing ascites and peritoneal nodularity with significant areas of residual nodularity. Greatest along the extreme anterior abdomen such as series 2 image 79. The level of the central mesenteric stranding is  similar but the ascites is less.   Musculoskeletal: Curvature of the spine with moderate degenerative changes.   IMPRESSION: Decreasing ascites, trace residual. The areas of omental caking and nodularity along the perineum is decreasing as well with significant residual particularly in the upper anterior abdomen.   Stable small mesenteric and retroperitoneal nodes.   Resolved left pleural effusion.   No developing new mass lesion.   Small left renal angiomyolipoma and upper pole nonobstructing renal stone.          Assessment:  Nancy [name] is a 58 y.o. 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 CA125 elevated at 762, and CEA and CA19-9 normal. 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 Excellent response to therapy.   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 Recommend cycle #4. Consider interval debulking surgery at [facility].   Obtain HbA1c next lab visit and encouraged her to optimize her diabetic management. Her doctor may place her back on Mounjaro .   We send a cardiology referral for preop clearance.   She has significant medical comorbidities.   Khorana score 3 (high risk due to Ovarian cancer, high BMI and elevated platelets).  Recommended prophylactic apixiban in view of high risk of VTE.    Discussed that hereditary genetic panel testing results and her family is getting cascade testing. Her mother had the BRIP gene mutation.   The patient's diagnosis, an outline of the further diagnostic and laboratory studies which will be required, the recommendation, and alternatives were discussed.  All questions were answered to the patient's satisfaction.  I personally had a face to face interaction and evaluated the patient jointly with the NP, Ms. Tinnie Dawn.  I have reviewed her history and available records and have performed the key portions of the physical exam including lymph node survey, abdominal exam, pelvic exam with my findings confirming those documented above by the APP.  I have discussed the case with the APP and the patient.  I agree with the above documentation, assessment and plan which was fully formulated by me.  Counseling was completed by me.   I personally saw the patient and performed a substantive portion of this encounter in conjunction with the listed APP as documented above.  Afreen Siebels Isidor Constable, MD   [facility] Isidor Constable, MD

## 2023-01-17 ENCOUNTER — Other Ambulatory Visit: Payer: Self-pay | Admitting: Family

## 2023-01-17 MED ORDER — MOUNJARO 5 MG/0.5ML ~~LOC~~ SOAJ: 5.0000 mg | SUBCUTANEOUS | 2 refills | Status: DC

## 2023-01-21 ENCOUNTER — Other Ambulatory Visit: Payer: Self-pay | Admitting: Hospice and Palliative Medicine

## 2023-01-21 NOTE — Telephone Encounter (Signed)
 Already done by [facility]

## 2023-01-23 ENCOUNTER — Other Ambulatory Visit: Payer: Self-pay | Admitting: Family

## 2023-01-23 MED ORDER — TRULICITY 1.5 MG/0.5ML ~~LOC~~ SOAJ
1.5000 mg | SUBCUTANEOUS | 2 refills | Status: DC
Start: 2023-01-23 — End: 2023-05-12

## 2023-01-23 MED FILL — Fosaprepitant Dimeglumine For IV Infusion 150 MG (Base Eq): INTRAVENOUS | Qty: 5 | Status: AC

## 2023-01-24 ENCOUNTER — Inpatient Hospital Stay (HOSPITAL_BASED_OUTPATIENT_CLINIC_OR_DEPARTMENT_OTHER): Payer: Medicaid Other | Admitting: Oncology

## 2023-01-24 ENCOUNTER — Inpatient Hospital Stay: Payer: Medicaid Other

## 2023-01-24 ENCOUNTER — Encounter: Payer: Self-pay | Admitting: Oncology

## 2023-01-24 VITALS — BP 116/76 | HR 84 | Temp 97.6°F | Resp 18 | Wt 255.5 lb

## 2023-01-24 VITALS — BP 148/76 | HR 85

## 2023-01-24 DIAGNOSIS — C482 Malignant neoplasm of peritoneum, unspecified: Secondary | ICD-10-CM

## 2023-01-24 DIAGNOSIS — Z5111 Encounter for antineoplastic chemotherapy: Secondary | ICD-10-CM | POA: Diagnosis not present

## 2023-01-24 DIAGNOSIS — R971 Elevated cancer antigen 125 [CA 125]: Secondary | ICD-10-CM | POA: Diagnosis not present

## 2023-01-24 DIAGNOSIS — T451X5A Adverse effect of antineoplastic and immunosuppressive drugs, initial encounter: Secondary | ICD-10-CM

## 2023-01-24 DIAGNOSIS — D508 Other iron deficiency anemias: Secondary | ICD-10-CM | POA: Diagnosis not present

## 2023-01-24 DIAGNOSIS — R110 Nausea: Secondary | ICD-10-CM | POA: Diagnosis not present

## 2023-01-24 DIAGNOSIS — Z148 Genetic carrier of other disease: Secondary | ICD-10-CM | POA: Diagnosis not present

## 2023-01-24 DIAGNOSIS — Z7985 Long-term (current) use of injectable non-insulin antidiabetic drugs: Secondary | ICD-10-CM | POA: Diagnosis not present

## 2023-01-24 DIAGNOSIS — E119 Type 2 diabetes mellitus without complications: Secondary | ICD-10-CM | POA: Diagnosis not present

## 2023-01-24 DIAGNOSIS — Z6837 Body mass index (BMI) 37.0-37.9, adult: Secondary | ICD-10-CM | POA: Diagnosis not present

## 2023-01-24 DIAGNOSIS — Z1509 Genetic susceptibility to other malignant neoplasm: Secondary | ICD-10-CM | POA: Diagnosis not present

## 2023-01-24 DIAGNOSIS — I429 Cardiomyopathy, unspecified: Secondary | ICD-10-CM | POA: Diagnosis not present

## 2023-01-24 DIAGNOSIS — Z9221 Personal history of antineoplastic chemotherapy: Secondary | ICD-10-CM | POA: Diagnosis not present

## 2023-01-24 DIAGNOSIS — Z1502 Genetic susceptibility to malignant neoplasm of ovary: Secondary | ICD-10-CM | POA: Diagnosis not present

## 2023-01-24 DIAGNOSIS — Z89512 Acquired absence of left leg below knee: Secondary | ICD-10-CM | POA: Diagnosis not present

## 2023-01-24 LAB — CBC WITH DIFFERENTIAL (CANCER CENTER ONLY)
Abs Immature Granulocytes: 0.07 10*3/uL (ref 0.00–0.07)
Basophils Absolute: 0.1 10*3/uL (ref 0.0–0.1)
Basophils Relative: 1 %
Eosinophils Absolute: 0.1 10*3/uL (ref 0.0–0.5)
Eosinophils Relative: 2 %
HCT: 31.1 % — ABNORMAL LOW (ref 36.0–46.0)
Hemoglobin: 10.2 g/dL — ABNORMAL LOW (ref 12.0–15.0)
Immature Granulocytes: 1 %
Lymphocytes Relative: 38 %
Lymphs Abs: 2.6 10*3/uL (ref 0.7–4.0)
MCH: 29.6 pg (ref 26.0–34.0)
MCHC: 32.8 g/dL (ref 30.0–36.0)
MCV: 90.1 fL (ref 80.0–100.0)
Monocytes Absolute: 0.6 10*3/uL (ref 0.1–1.0)
Monocytes Relative: 10 %
Neutro Abs: 3.3 10*3/uL (ref 1.7–7.7)
Neutrophils Relative %: 48 %
Platelet Count: 110 10*3/uL — ABNORMAL LOW (ref 150–400)
RBC: 3.45 MIL/uL — ABNORMAL LOW (ref 3.87–5.11)
RDW: 20.6 % — ABNORMAL HIGH (ref 11.5–15.5)
WBC Count: 6.8 10*3/uL (ref 4.0–10.5)
nRBC: 0 % (ref 0.0–0.2)

## 2023-01-24 LAB — CMP (CANCER CENTER ONLY)
ALT: 21 U/L (ref 0–44)
AST: 20 U/L (ref 15–41)
Albumin: 3.5 g/dL (ref 3.5–5.0)
Alkaline Phosphatase: 49 U/L (ref 38–126)
Anion gap: 10 (ref 5–15)
BUN: 33 mg/dL — ABNORMAL HIGH (ref 6–20)
CO2: 25 mmol/L (ref 22–32)
Calcium: 8.9 mg/dL (ref 8.9–10.3)
Chloride: 102 mmol/L (ref 98–111)
Creatinine: 0.81 mg/dL (ref 0.44–1.00)
GFR, Estimated: >60 mL/min (ref >60)
Glucose, Bld: 145 mg/dL — ABNORMAL HIGH (ref 70–99)
Potassium: 4.3 mmol/L (ref 3.5–5.1)
Sodium: 137 mmol/L (ref 135–145)
Total Bilirubin: 0.3 mg/dL (ref 0.0–1.2)
Total Protein: 6.8 g/dL (ref 6.5–8.1)

## 2023-01-24 LAB — HEMOGLOBIN A1C
Hgb A1c MFr Bld: 8.4 % — ABNORMAL HIGH (ref 4.8–5.6)
Mean Plasma Glucose: 194.38 mg/dL

## 2023-01-24 MED ORDER — FAMOTIDINE IN NACL 20-0.9 MG/50ML-% IV SOLN
20.0000 mg | Freq: Once | INTRAVENOUS | Status: AC
  Administered 2023-01-24: 20 mg via INTRAVENOUS
  Filled 2023-01-24: qty 50

## 2023-01-24 MED ORDER — SODIUM CHLORIDE 0.9 % IV SOLN
INTRAVENOUS | Status: DC
  Filled 2023-01-24: qty 250

## 2023-01-24 MED ORDER — PALONOSETRON HCL INJECTION 0.25 MG/5ML
0.2500 mg | Freq: Once | INTRAVENOUS | Status: AC
  Administered 2023-01-24: 0.25 mg via INTRAVENOUS
  Filled 2023-01-24: qty 5

## 2023-01-24 MED ORDER — DIPHENHYDRAMINE HCL 50 MG/ML IJ SOLN
50.0000 mg | Freq: Once | INTRAMUSCULAR | Status: AC
  Administered 2023-01-24: 50 mg via INTRAVENOUS
  Filled 2023-01-24: qty 1

## 2023-01-24 MED ORDER — HEPARIN SOD (PORK) LOCK FLUSH 100 UNIT/ML IV SOLN
500.0000 [IU] | Freq: Once | INTRAVENOUS | Status: AC | PRN
  Administered 2023-01-24: 500 [IU]
  Filled 2023-01-24: qty 5

## 2023-01-24 MED ORDER — DEXAMETHASONE SODIUM PHOSPHATE 10 MG/ML IJ SOLN
10.0000 mg | Freq: Once | INTRAMUSCULAR | Status: AC
  Administered 2023-01-24: 10 mg via INTRAVENOUS
  Filled 2023-01-24: qty 1

## 2023-01-24 MED ORDER — SODIUM CHLORIDE 0.9 % IV SOLN
175.0000 mg/m2 | Freq: Once | INTRAVENOUS | Status: AC
  Administered 2023-01-24: 360 mg via INTRAVENOUS
  Filled 2023-01-24: qty 60

## 2023-01-24 MED ORDER — CARBOPLATIN CHEMO INJECTION 600 MG/60ML
750.0000 mg | Freq: Once | INTRAVENOUS | Status: AC
  Administered 2023-01-24: 750 mg via INTRAVENOUS
  Filled 2023-01-24: qty 75

## 2023-01-24 MED ORDER — SODIUM CHLORIDE 0.9 % IV SOLN
150.0000 mg | Freq: Once | INTRAVENOUS | Status: AC
  Administered 2023-01-24: 150 mg via INTRAVENOUS
  Filled 2023-01-24: qty 150

## 2023-01-24 NOTE — Assessment & Plan Note (Signed)
Lab Results  Component Value Date   HGB 10.2 (L) 01/24/2023   TIBC 340 01/03/2023   IRONPCTSAT 45 (H) 01/03/2023   FERRITIN 99 01/03/2023    continue oral iron supplementation. Stable Hb

## 2023-01-24 NOTE — Assessment & Plan Note (Signed)
 We discussed about instructions of antiemetics.  She can use Compazine as instructed if Zofran only partially relieves her symptoms.

## 2023-01-24 NOTE — Assessment & Plan Note (Addendum)
Malignant ascites. CA125 is significantly elevated in 700s.  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  I will not add Bevacizumab neoadjuvantly given her recent surgery, Labs are reviewed and discussed with patient. Proceed with cycle 4 carboplatin and taxol.  CT showed excellent response. There is plan for surgery on 2/18.  Tentatively I will schedule her for adjuvant carboplatin taxol chemotherapy after surgery.  Consider add bevacizumab and PARP inhibitor.     Khorana score 3  on Eliquis 2.5mg  BID, continue Aspirin 81mg  daily.  Genetic evaluation.

## 2023-01-24 NOTE — Progress Notes (Signed)
Hematology/Oncology Progress note [phone] [phone]      CHIEF COMPLAINTS  Primary peritoneal carcinomatosis  ASSESSMENT & PLAN:   Primary peritoneal carcinomatosis (HCC) Malignant ascites. CA125 is significantly elevated in 700s.  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  I will not add Bevacizumab neoadjuvantly given her recent surgery, Labs are reviewed and discussed with patient. Proceed with cycle 4 carboplatin and taxol.  CT showed excellent response. There is plan for surgery on 2/18.  Tentatively I will schedule her for adjuvant carboplatin taxol chemotherapy after surgery.  Consider add bevacizumab and PARP inhibitor.     Khorana score 3  on Eliquis 2.5mg  BID, continue Aspirin 81mg  daily.  Genetic evaluation.   Chemotherapy-induced nausea We discussed about instructions of antiemetics.  She can use Compazine as instructed if Zofran only partially relieves her symptoms.   Encounter for antineoplastic chemotherapy Chemotherapy treatment as planned.   IDA (iron deficiency anemia) Lab Results  Component Value Date   HGB 10.2 (L) 01/24/2023   TIBC 340 01/03/2023   IRONPCTSAT 45 (H) 01/03/2023   FERRITIN 99 01/03/2023    continue oral iron supplementation. Stable Hb   Follow up 3 weeks lab covering MD chemo.  Repeat CT after cycle 3.  6 weeks lab MD chemo.   All questions were answered. The patient knows to call the clinic with any problems, questions or concerns.  Rickard Patience, MD, PhD [facility] Hematology Oncology 01/24/2023    HISTORY OF PRESENTING ILLNESS:  Nancy [name] 58 y.o. female presents to establish care for  peritoneal carcinomatosis I have reviewed her chart and materials related to her cancer extensively and collaborated history with the patient. Summary of oncologic history is as  follows: Oncology History  Primary peritoneal carcinomatosis (HCC)  11/02/2022 Imaging   Large volume ascites in the abdomen or pelvis with omental caking.Appearance is concerning for peritoneal malignancy/carcinomatosis,often seen with ovarian cancer, but no ovarian mass visualized. Aortic atherosclerosis    11/06/2022 Initial Diagnosis   Primary peritoneal carcinomatosis   -presented to emergency room for evaluation of abdominal pain/bloating, poor oral intake, nonbloody nonbilious vomiting  Patient underwent paracentesis, cytology showed malignant cells. IHC can not be added.    11/14/2022 Imaging   CT chest w contrast  1. Prominent subcentimeter right retrocrural lymph nodes, metastatic disease can not be excluded. 2. Nonspecific ground-glass nodule of the superior portion of the right lower lobe measuring 7 mm. Recommend attention on follow-up. 3. Small left pleural effusion. 4. Partially visualized large volume abdominal ascites and peritoneal thickening, volume of ascites appears increased when compared with the prior CT. 5. Coronary artery calcifications and aortic Atherosclerosis (ICD10-I70.0).   11/18/2022 Procedure   Medi port placement by IR   11/18/2022 Procedure   Omentum biopsy showed  1. Omentum, biopsy,  :   - INVOLVEMENT BY HIGH-GRADE SEROUS CARCINOMA OF GYNECOLOGIC ORIGIN.   Diagnosis Note : The carcinoma is positive for cytokeratin 7, PAX8, WT1, and ER.  Cytokeratin 20 and GATA3 are negative.  P53 is overexpressed.  The morphologic findings in conjunction with the pattern of immunohistochemistry support the  above diagnosis.   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 The report  date is 11/25/2022.  The CancerNext-Expanded + RNAinsight gene panel offered by  [employer] and includes sequencing and rearrangement analysis for the following 71 genes: AIP, ALK, APC*, ATM*, AXIN2, BAP1, BARD1, BMPR1A, BRCA1*, BRCA2*, BRIP1*, CDC73, CDH1*,CDK4, CDKN1B, CDKN2A, CHEK2*, CTNNA1, DICER1, FH, FLCN, KIF1B, LZTR1, MAX, MEN1, MET, MLH1*, MSH2*, MSH3, MSH6*, MUTYH*, NF1*, NF2, NTHL1, PALB2*, PHOX2B, PMS2*, POT1, PRKAR1A, PTCH1, PTEN*, RAD51C*, RAD51D*,RB1, RET, SDHA, SDHAF2, SDHB, SDHC, SDHD, SMAD4, SMARCA4, SMARCB1, SMARCE1, STK11, SUFU, TMEM127, TP53*,TSC1, TSC2, VHL; EGFR, EGLN1, HOXB13, KIT, MITF, PDGFRA, POLD1 and POLE (sequencing only); EPCAM and GREM1 (deletion/duplication only).    01/13/2023 Imaging   CT chest abdomen pelvis w contrast showed  Decreasing ascites, trace residual. The areas of omental caking and nodularity along the perineum is decreasing as well with significant residual particularly in the upper anterior abdomen.   Stable small mesenteric and retroperitoneal nodes. Resolved left pleural effusion. No developing new mass lesion. Small left renal angiomyolipoma and upper pole nonobstructing renal stone.   History of sepsis secondary to diabetic foot infection/osteomyelitis status post left BKA done on 09/24/2022,    Today she presents to for evaluation prior to chemotherapy. Accompanied by significant other.  + nausea, manageable with antiemetics.  No other new complaints.     MEDICAL HISTORY:  Past Medical History:  Diagnosis Date   Abnormal cardiovascular stress test 09/21/2018   Formatting of this note might be different from the original. Lexiscan Myoview 09/16/2018 revealed mild anterior ischemia   Adverse effect of motion 05/10/2014   AKI (acute kidney injury) (HCC) 10/22/2022   Anxiety    Arthritis    r knee   Asthma    Breast cyst 05/10/2014   CAD (coronary artery disease)    a.) LHC 06/04/2009 at [facility]; non-obstructive CAD. b.) CTA with FFR 10/08/2018: extensive mixed plaque proximal to mid LAD (51-69%); Coronary Ca score  217; FFR 0.71 dPDA, 0.86 mLAD, 0.87 dLCx.   CCF (congestive cardiac failure) (HCC) 06/06/2008   a.) 30% EF. b.) TTE 06/03/2011: EF >55%; triv MR. c.) TTE 04/27/2018: EF 55%; mild LVH; triv PR, mild MR/TR; G1DD.   Cellulitis of left lower extremity 09/18/2022   Chest pain with high risk for cardiac etiology 07/02/2016   Chronic use of opiate drug for therapeutic purpose 02/13/2022   Formatting of this note might be different from the original. [facility] Pain Contract signed on 04/18/16 & updated 03/04/17; UDS done on 04/18/16.   Complication of anesthesia    Diabetic foot infection (HCC) 03/13/2015   Diabetic foot ulcer (HCC) 02/08/2021   Diabetic ulcer of left heel associated with type 2 diabetes mellitus (HCC) 02/08/2021   Eczema    Family history of adverse reaction to anesthesia    a.) PONV in mother and grandmother   Gas gangrene (HCC) 09/18/2022   GERD (gastroesophageal reflux disease) 05/10/2014   History of kidney stones    History of MSSA bacteremia due to osteomyelitis L foot 09/18/22 10/22/2022   HLD (hyperlipidemia)    Hyponatremia 03/13/2015   Migraines    Motion sickness    MSSA bacteremia 09/20/2022   Osteomyelitis (HCC) 09/18/2022   Other acute osteomyelitis, left ankle and foot (HCC) 02/13/2022   Panic attacks    Peripheral edema 04/27/2018   Pneumonia    PONV (postoperative nausea and vomiting)    Sepsis secondary to diabetic foot infection 03/13/2015   Sepsis secondary to diabetic foot infection 03/13/2015   T2DM (type 2 diabetes mellitus) (HCC)    Takotsubo cardiomyopathy / transient  apical balooning syndrome / stress-induced cardiomyopathy 05/10/2014   Unspecified essential hypertension     SURGICAL HISTORY: Past Surgical History:  Procedure Laterality Date   AMPUTATION Left 09/19/2022   Procedure: AMPUTATION BELOW KNEE;  Surgeon: Renford Dills, MD;  Location: [facility];  Service: Vascular;  Laterality: Left;   AMPUTATION Left 09/24/2022   Procedure: AMPUTATION  BELOW KNEE WITH WOUND CLOSURE;  Surgeon: Renford Dills, MD;  Location: [facility];  Service: Vascular;  Laterality: Left;   AMPUTATION TOE Left 03/16/2015   Procedure: left fifth toe amputation with incision and drainage;  Surgeon: Gwyneth Revels, DPM;  Location: [facility];  Service: Podiatry;  Laterality: Left;   APPLICATION OF WOUND VAC Left 02/14/2021   Procedure: APPLICATION OF WOUND VAC;  Surgeon: Gwyneth Revels, DPM;  Location: [facility];  Service: Podiatry;  Laterality: Left;   APPLICATION OF WOUND VAC Left 04/16/2021   Procedure: APPLICATION OF WOUND VAC;  Surgeon: Peggye Form, DO;  Location: [facility];  Service: Plastics;  Laterality: Left;   CARDIAC CATHETERIZATION Left 06/04/2009   Procedure: CARDIAC CATHETERIZATION; Location: [facility]   CHOLECYSTECTOMY     DEBRIDEMENT AND CLOSURE WOUND Left 04/16/2021   Procedure: DEBRIDEMENT AND CLOSURE WOUND;  Surgeon: Peggye Form, DO;  Location: [facility];  Service: Plastics;  Laterality: Left;  1 hour   IR IMAGING GUIDED PORT INSERTION  11/18/2022   IRRIGATION AND DEBRIDEMENT FOOT Left 02/08/2021   Procedure: IRRIGATION AND DEBRIDEMENT FOOT - LFT HEEL ULCER;  Surgeon: Gwyneth Revels, DPM;  Location: [facility];  Service: Podiatry;  Laterality: Left;   IRRIGATION AND DEBRIDEMENT FOOT Left 02/14/2021   Procedure: IRRIGATION AND DEBRIDEMENT LEFT HEEL;  Surgeon: Gwyneth Revels, DPM;  Location: [facility];  Service: Podiatry;  Laterality: Left;   kidney stone removal     KNEE ARTHROSCOPY W/ MENISCAL REPAIR     NOSE SURGERY  01/07/1989   due to fracture   TEE WITHOUT CARDIOVERSION N/A 09/26/2022   Procedure: TRANSESOPHAGEAL ECHOCARDIOGRAM;  Surgeon: Clotilde Dieter, DO;  Location: [facility];  Service: Cardiovascular;  Laterality: N/A;   TOE AMPUTATION     second toe   TONSILLECTOMY     TOTAL KNEE ARTHROPLASTY  01/11/2011   Procedure: TOTAL KNEE ARTHROPLASTY;  Surgeon: Harvie Junior;  [facility];  Service: Orthopedics;  Laterality: Right;  COMPUTER ASSISTED TOTAL KNEE REPLACEMENT    SOCIAL HISTORY: Social History   Socioeconomic History   Marital status: Married    Spouse name: Not on file   Number of children: Not on file   Years of education: Not on file   Highest education level: Not on file  Occupational History   Not on file  Tobacco Use   Smoking status: Former    Current packs/day: 0.00    Types: Cigarettes    Quit date: 03/12/1985    Years since quitting: 37.8   Smokeless tobacco: Never  Vaping Use   Vaping status: Never Used  Substance and Sexual Activity   Alcohol use: No   Drug use: No   Sexual activity: Yes    Birth control/protection: Post-menopausal  Other Topics Concern   Not on file  Social History Narrative   Wife, [name], at bedside. One indoor pet, dog.   Social Drivers of Corporate investment banker Strain: Not on file  Food Insecurity: No Food Insecurity (10/22/2022)   Hunger Vital Sign    Worried About Running Out of Food in the  Last Year: Never true    Ran Out of Food in the Last Year: Never true  Transportation Needs: No Transportation Needs (10/22/2022)   PRAPARE - Administrator, Civil Service (Medical): No    Lack of Transportation (Non-Medical): No  Physical Activity: Not on file  Stress: Not on file  Social Connections: Not on file  Intimate Partner Violence: Not At Risk (10/22/2022)   Humiliation, Afraid, Rape, and Kick questionnaire    Fear of Current or Ex-Partner: No    Emotionally Abused: No    Physically Abused: No    Sexually Abused: No    FAMILY HISTORY: Family History  Problem Relation Age of Onset   Transient ischemic attack Mother    Asthma Mother    Hypertension Mother    Lung cancer Father 27   Hypertension Father    Asthma Sister    Anxiety disorder Sister    Asthma Sister    Hypertension Brother    Ovarian cancer Maternal Aunt 77   Melanoma Maternal Uncle        dx 23s   Lung  cancer Paternal Aunt 46   Stomach cancer Maternal Grandmother    Colon cancer Maternal Grandmother    Lymphoma Maternal Grandmother    Melanoma Maternal Grandfather 72       sun exposure   Lung cancer Paternal Grandmother 65   Kidney cancer Paternal Grandfather        dx early   Crohn's disease Other    GER disease Other    Breast cancer Neg Hx     ALLERGIES:  is allergic to shellfish allergy, codeine, morphine and codeine, rosuvastatin, percocet [oxycodone-acetaminophen], sulfa antibiotics, and theophyllines.  MEDICATIONS:  Current Outpatient Medications  Medication Sig Dispense Refill   acidophilus (RISAQUAD) CAPS capsule Take 1 capsule by mouth daily.     albuterol (PROVENTIL HFA;VENTOLIN HFA) 108 (90 Base) MCG/ACT inhaler Inhale 2 puffs into the lungs every 4 (four) hours as needed for wheezing or shortness of breath. Reported on 03/29/2015 1 Inhaler 3   albuterol (PROVENTIL) (2.5 MG/3ML) 0.083% nebulizer solution Take 2.5 mg by nebulization as needed.     ALPRAZolam (XANAX) 0.5 MG tablet TAKE 1/2 TO 1 TABLET BY MOUTH AS NEEDED FOR PANIC ATTACKS 30 tablet 2   apixaban (ELIQUIS) 2.5 MG TABS tablet Take 1 tablet (2.5 mg total) by mouth 2 (two) times daily. 60 tablet 2   ascorbic acid (VITAMIN C) 500 MG tablet Take 1 tablet (500 mg total) by mouth 2 (two) times daily. 60 tablet 0   aspirin EC 81 MG tablet Take 81 mg by mouth daily.     carvedilol (COREG) 6.25 MG tablet TAKE 1 TABLET BY MOUTH TWICE A DAY 180 tablet 1   cholecalciferol (VITAMIN D3) 25 MCG (1000 UNIT) tablet Take 1,000 Units by mouth daily.     Cyanocobalamin (VITAMIN B 12) 500 MCG TABS Take by mouth daily at 8 pm.     desvenlafaxine (PRISTIQ) 100 MG 24 hr tablet TAKE 1 TABLET BY MOUTH EVERY DAY 90 tablet 2   dexamethasone (DECADRON) 4 MG tablet Take 2 tablets (8mg ) by mouth daily starting the day after carboplatin for 2 days. Take with food 30 tablet 1   dicyclomine (BENTYL) 10 MG capsule Take 1 capsule (10 mg total) by  mouth 4 (four) times daily -  before meals and at bedtime. 60 capsule 1   Dulaglutide (TRULICITY) 1.5 MG/0.5ML SOAJ Inject 1.5 mg into the skin once a  week. 2 mL 2   Ensure Max Protein (ENSURE MAX PROTEIN) LIQD Take 330 mLs (11 oz total) by mouth 2 (two) times daily.     fenofibrate 54 MG tablet TAKE 1 TABLET BY MOUTH EVERY DAY 90 tablet 3   fluconazole (DIFLUCAN) 200 MG tablet Take 1 tablet (200 mg total) by mouth daily. 30 tablet 3   gabapentin (NEURONTIN) 300 MG capsule Take 1 capsule (300 mg total) by mouth at bedtime as needed. 30 capsule 3   HYDROmorphone (DILAUDID) 2 MG tablet Take 1 tablet (2 mg total) by mouth every 4 (four) hours as needed for severe pain (pain score 7-10). 60 tablet 0   ibuprofen (ADVIL) 800 MG tablet TAKE 1 TABLET BY MOUTH EVERY 8 HOURS AS NEEDED 90 tablet 3   insulin NPH-regular Human (70-30) 100 UNIT/ML injection Inject 15 Units into the skin in the morning and at bedtime.     iron polysaccharides (NIFEREX) 150 MG capsule Take 1 capsule (150 mg total) by mouth daily. 30 capsule 2   lidocaine-prilocaine (EMLA) cream Apply to affected area once 30 g 3   lisinopril (ZESTRIL) 10 MG tablet Take 1 tablet (10 mg total) by mouth every morning AND 0.5 tablets (5 mg total) at bedtime. 45 tablet 11   metFORMIN (GLUCOPHAGE) 500 MG tablet TAKE TWO TABLETS EACH MORNING. AND 3 TABLETS IN THE EVENING AS DIRECTED 150 tablet 5   methocarbamol (ROBAXIN) 500 MG tablet Take 1 tablet (500 mg total) by mouth every 8 (eight) hours as needed for muscle spasms. 30 tablet 0   mirtazapine (REMERON) 7.5 MG tablet TAKE 1 TABLET BY MOUTH AT BEDTIME. 90 tablet 2   montelukast (SINGULAIR) 10 MG tablet TAKE 1 TABLET BY MOUTH EVERYDAY AT BEDTIME 90 tablet 1   Multiple Vitamin (MULTIVITAMIN WITH MINERALS) TABS tablet Take 1 tablet by mouth daily. 30 tablet 2   naloxone (NARCAN) nasal spray 4 mg/0.1 mL SPRAY 1 SPRAY INTO ONE NOSTRIL AS DIRECTED FOR OPIOID OVERDOSE (TURN PERSON ON SIDE AFTER DOSE. IF NO  RESPONSE IN 2-3 MINUTES OR PERSON RESPONDS BUT RELAPSES, REPEAT USING A NEW SPRAY DEVICE AND SPRAY INTO THE OTHER NOSTRIL. CALL 911 AFTER USE.) * EMERGENCY USE ONLY * 1 each 0   nystatin (MYCOSTATIN/NYSTOP) powder APPLY TOPICALLY TWICE DAILY AS NEEDED 30 g 3   nystatin ointment (MYCOSTATIN) Apply 1 Application topically 2 (two) times daily. 30 g 0   ondansetron (ZOFRAN) 8 MG tablet Take 1 tablet (8 mg total) by mouth every 8 (eight) hours as needed for nausea or vomiting. Start on the third day after carboplatin. 30 tablet 1   ondansetron (ZOFRAN-ODT) 8 MG disintegrating tablet Take 1 tablet (8 mg total) by mouth every 8 (eight) hours as needed for nausea or vomiting. 90 tablet 3   pantoprazole (PROTONIX) 40 MG tablet TAKE 1 TABLET BY MOUTH EVERY DAY 90 tablet 3   pravastatin (PRAVACHOL) 40 MG tablet TAKE 1 TABLET BY MOUTH EVERY DAY 30 tablet 5   prochlorperazine (COMPAZINE) 10 MG tablet Take 1 tablet (10 mg total) by mouth every 6 (six) hours as needed for nausea or vomiting. 30 tablet 1   Ubrogepant (UBRELVY) 100 MG TABS Take 1 tablet (100 mg total) by mouth as needed (migraine). 12 tablet 1   zinc sulfate 220 (50 Zn) MG capsule Take 1 capsule (220 mg total) by mouth daily. 30 capsule 0   Continuous Glucose Sensor (FREESTYLE LIBRE 2 SENSOR) MISC 1 each by Does not apply route every 14 (  fourteen) days. (Patient not taking: Reported on 01/24/2023) 2 each 3   No current facility-administered medications for this visit.   Facility-Administered Medications Ordered in Other Visits  Medication Dose Route Frequency Provider Last Rate Last Admin   0.9 %  sodium chloride infusion   Intravenous Continuous Rickard Patience, MD 10 mL/hr at 01/24/23 0938 New Bag at 01/24/23 0938   CARBOplatin (PARAPLATIN) 750 mg in sodium chloride 0.9 % 250 mL chemo infusion  750 mg Intravenous Once Rickard Patience, MD       famotidine (PEPCID) IVPB 20 mg premix  20 mg Intravenous Once Rickard Patience, MD 200 mL/hr at 01/24/23 1018 20 mg at 01/24/23  1018   PACLitaxel (TAXOL) 360 mg in sodium chloride 0.9 % 500 mL chemo infusion (> 80mg /m2)  175 mg/m2 (Treatment Plan Adjusted) Intravenous Once Rickard Patience, MD        Review of Systems  Constitutional:  Positive for appetite change and unexpected weight change. Negative for chills, fatigue and fever.  HENT:   Negative for hearing loss and voice change.   Eyes:  Negative for eye problems.  Respiratory:  Negative for chest tightness, cough and shortness of breath.   Cardiovascular:  Negative for chest pain and leg swelling.  Gastrointestinal:  Positive for abdominal distention and abdominal pain. Negative for blood in stool.  Endocrine: Negative for hot flashes.  Genitourinary:  Negative for difficulty urinating and frequency.   Musculoskeletal:  Negative for arthralgias.       S/p left  BKA   Skin:  Negative for itching and rash.  Neurological:  Negative for extremity weakness.  Hematological:  Negative for adenopathy.  Psychiatric/Behavioral:  Negative for confusion.      PHYSICAL EXAMINATION: ECOG PERFORMANCE STATUS: 1 - Symptomatic but completely ambulatory  Vitals:   01/24/23 0848  BP: 116/76  Pulse: 84  Resp: 18  Temp: 97.6 F (36.4 C)  SpO2: 99%   Filed Weights   01/24/23 0848  Weight: 255 lb 8 oz (115.9 kg)    Physical Exam Constitutional:      General: She is not in acute distress.    Appearance: She is obese. She is not diaphoretic.  HENT:     Head: Normocephalic and atraumatic.     Mouth/Throat:     Pharynx: No oropharyngeal exudate.  Eyes:     General: No scleral icterus. Cardiovascular:     Rate and Rhythm: Normal rate and regular rhythm.  Pulmonary:     Effort: Pulmonary effort is normal. No respiratory distress.     Breath sounds: Normal breath sounds. No wheezing.  Abdominal:     General: There is no distension.     Palpations: Abdomen is soft.     Tenderness: There is no abdominal tenderness.  Musculoskeletal:        General: Normal range of  motion.     Cervical back: Normal range of motion and neck supple.     Comments: S/p left BKA  Skin:    General: Skin is warm and dry.     Findings: No erythema.  Neurological:     Mental Status: She is alert and oriented to person, place, and time. Mental status is at baseline.     Cranial Nerves: No cranial nerve deficit.     Motor: No abnormal muscle tone.  Psychiatric:        Mood and Affect: Affect normal.      LABORATORY DATA:  I have reviewed the data as listed  Latest Ref Rng & Units 01/24/2023    8:15 AM 01/03/2023    8:32 AM 12/13/2022    8:08 AM  CBC  WBC 4.0 - 10.5 K/uL 6.8  4.9  7.1   Hemoglobin 12.0 - 15.0 g/dL 65.7  9.8  9.7   Hematocrit 36.0 - 46.0 % 31.1  30.7  30.4   Platelets 150 - 400 K/uL 110  176  391       Latest Ref Rng & Units 01/24/2023    8:15 AM 01/03/2023    8:32 AM 12/13/2022    8:08 AM  CMP  Glucose 70 - 99 mg/dL 846  962  952   BUN 6 - 20 mg/dL 33  24  21   Creatinine 0.44 - 1.00 mg/dL 8.41  3.24  4.01   Sodium 135 - 145 mmol/L 137  136  137   Potassium 3.5 - 5.1 mmol/L 4.3  4.4  4.0   Chloride 98 - 111 mmol/L 102  100  101   CO2 22 - 32 mmol/L 25  27  27    Calcium 8.9 - 10.3 mg/dL 8.9  9.1  9.2   Total Protein 6.5 - 8.1 g/dL 6.8  7.5  7.5   Total Bilirubin 0.0 - 1.2 mg/dL 0.3  0.3  0.2   Alkaline Phos 38 - 126 U/L 49  41  45   AST 15 - 41 U/L 20  16  15    ALT 0 - 44 U/L 21  18  15       RADIOGRAPHIC STUDIES: I have personally reviewed the radiological images as listed and agreed with the findings in the report. CT CHEST ABDOMEN PELVIS W CONTRAST Result Date: 01/14/2023 CLINICAL DATA:  Peritoneal carcinomatosis.  * Tracking Code: BO * EXAM: CT CHEST, ABDOMEN, AND PELVIS WITH CONTRAST TECHNIQUE: Multidetector CT imaging of the chest, abdomen and pelvis was performed following the standard protocol during bolus administration of intravenous contrast. RADIATION DOSE REDUCTION: This exam was performed according to the departmental  dose-optimization program which includes automated exposure control, adjustment of the mA and/or kV according to patient size and/or use of iterative reconstruction technique. CONTRAST:  OMNIPAQUE IOHEXOL 300 MG/ML  SOLN COMPARISON:  Chest CT 11/14/2022.  Abdomen pelvis CT 10/22/2022. FINDINGS: CT CHEST FINDINGS Cardiovascular: Right upper chest port is accessed. Tip of the catheter extends into the right atrium. Coronary artery calcifications are seen. The heart is nonenlarged. Calcifications along the mitral valve annulus. Thoracic aorta has a normal course and caliber with some mild calcified plaque scattered. Mediastinum/Nodes: Preserved thyroid gland. Slightly patulous thoracic esophagus. No specific abnormal lymph node enlargement identified in the axillary regions, hilum or mediastinum. Lungs/Pleura: Calcified right apical lung nodule identified on series 4, image 27 consistent with old granulomatous disease and is unchanged. No new dominant lung nodule identified. No consolidation, pneumothorax or effusion. Additional punctate calcification in the left lung series 4, image 98. Previous pleural effusion on the left is no longer identified. The small ground-glass nodule in the posterior right lower lobe is stable in size on image 77 but much more faint. Musculoskeletal: There is some curvature of the spine with some degenerative changes. CT ABDOMEN PELVIS FINDINGS Hepatobiliary: No focal liver abnormality is seen. Status post cholecystectomy. No biliary dilatation. Patent portal vein. Pancreas: Unremarkable. No pancreatic ductal dilatation or surrounding inflammatory changes. Spleen: Normal in size without focal abnormality. Adrenals/Urinary Tract: The adrenal glands are preserved. Punctate upper pole nonobstructing left-sided renal stone. There is small area  of macroscopic fat as well in the posterior left mid kidney consistent with a angiomyolipoma measuring 16 mm on series 2, image 68. No separate  enhancing mass. There are some tiny low-attenuation lesions elsewhere as well which are too small to completely characterize. Bosniak 2 lesions. Ureters have normal course and caliber down to the bladder. Preserved contours of the urinary bladder. Stomach/Bowel: Stomach and small bowel are nondilated on this non oral contrast examination large bowel as well has a normal course and caliber with diffuse colonic stool. Vascular/Lymphatic: Normal caliber aorta and IVC. Scattered vascular calcifications. There are several small less than 1 cm size nodes identified along the retroperitoneum, nonpathologic by size criteria. More numerous than usually seen but similar in appearance of the previous examination. Reproductive: Uterus and bilateral adnexa are unremarkable. Other: Decreasing ascites and peritoneal nodularity with significant areas of residual nodularity. Greatest along the extreme anterior abdomen such as series 2 image 79. The level of the central mesenteric stranding is similar but the ascites is less. Musculoskeletal: Curvature of the spine with moderate degenerative changes. IMPRESSION: Decreasing ascites, trace residual. The areas of omental caking and nodularity along the perineum is decreasing as well with significant residual particularly in the upper anterior abdomen. Stable small mesenteric and retroperitoneal nodes. Resolved left pleural effusion. No developing new mass lesion. Small left renal angiomyolipoma and upper pole nonobstructing renal stone. Electronically Signed   By: Karen Kays M.D.   On: 01/14/2023 13:40

## 2023-01-24 NOTE — Progress Notes (Signed)
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  Patient receiving carboplatin, paclitaxel.    Met with patient during infusion.  Reports that her appetite is better.  "I ate too many sweets over the holidays and my blood sugar went up."  PCP has got her back on Trulicity.  She is monitoring blood glucose.  Reports that stomach pains, nausea are better.    Medications: reviewed  Labs: reviewed  Anthropometrics:   Weight 255 lb 8 oz today 248 lb on 12/6 269 lb 3.2 oz on 11/4 259 lb on 11/5 274 lb on 09/18/22   NUTRITION DIAGNOSIS: Inadequate oral intake improved   INTERVENTION:  Discussed importance of optimizing nutrition prior to surgery.   Reviewed importance of limiting sugary foods/beverages in diet to help blood glucose now that appetite has improved Encouraged lean protein foods and plant based protein (nuts, nut butters, etc)    MONITORING, EVALUATION, GOAL: weight trends, intake   NEXT VISIT: Wednesday, Feb 26 during infusion  Elizah Mierzwa B. Freida Busman, RD, LDN Registered Dietitian [phone]

## 2023-01-24 NOTE — Assessment & Plan Note (Signed)
Chemotherapy treatment as planned. 

## 2023-01-25 LAB — CA 125: Cancer Antigen (CA) 125: 14.3 U/mL (ref 0.0–38.1)

## 2023-01-27 ENCOUNTER — Institutional Professional Consult (permissible substitution): Payer: 59 | Admitting: Cardiovascular Disease

## 2023-01-28 ENCOUNTER — Ambulatory Visit: Payer: 59 | Admitting: Cardiovascular Disease

## 2023-01-28 ENCOUNTER — Encounter: Payer: Self-pay | Admitting: Cardiovascular Disease

## 2023-01-28 VITALS — BP 128/62 | HR 77 | Ht 69.0 in | Wt 247.0 lb

## 2023-01-28 DIAGNOSIS — Z01818 Encounter for other preprocedural examination: Secondary | ICD-10-CM | POA: Diagnosis not present

## 2023-01-28 DIAGNOSIS — E1121 Type 2 diabetes mellitus with diabetic nephropathy: Secondary | ICD-10-CM

## 2023-01-28 DIAGNOSIS — Z794 Long term (current) use of insulin: Secondary | ICD-10-CM | POA: Diagnosis not present

## 2023-01-28 DIAGNOSIS — I5181 Takotsubo syndrome: Secondary | ICD-10-CM

## 2023-01-28 DIAGNOSIS — I10 Essential (primary) hypertension: Secondary | ICD-10-CM

## 2023-01-28 DIAGNOSIS — Z89512 Acquired absence of left leg below knee: Secondary | ICD-10-CM

## 2023-01-28 NOTE — Progress Notes (Signed)
Cardiology Office Note   Date:  01/28/2023   ID:  Nancy [name], Nancy [name] Jul 21, 1965, MRN [id number]  PCP:  Miki Kins, FNP  Cardiologist:  Adrian Blackwater, MD      History of Present Illness: KAVONNA GARRELS is a 58 y.o. female who presents for  Chief Complaint  Patient presents with   Medical Clearance    Clearance    This is a 58 year old white female with a past medical history of hypertension diabetes hyperlipidemia who has stage III ovarian cancer with peritoneal carcinomatosis requiring surgery at [facility] presented for surgical clearance.  Patient denies any chest pain or shortness of breath at this time.  She has a history of having Takotsubo cardiomyopathy diagnosed in May 2016 at that time coronaries were normal.  Patient also had CTA coronaries in 2020 with FFR which was unremarkable.  She had a echocardiogram done at [facility] which showed her left ventricular ejection fraction 55 to 60%.  She has amputation of the left foot last year because of osteomyelitis and gangrene.  Currently not very symptomatic with no chest pain or shortness of breath.  She did have left ventricular ejection fraction 30% when she was diagnosed with Takotsubo cardiomyopathy but last ejection fraction checked was normal and 9/24 at [facility].      Past Medical History:  Diagnosis Date   Abnormal cardiovascular stress test 09/21/2018   Formatting of this note might be different from the original. Lexiscan Myoview 09/16/2018 revealed mild anterior ischemia   Adverse effect of motion 05/10/2014   AKI (acute kidney injury) (HCC) 10/22/2022   Anxiety    Arthritis    r knee   Asthma    Breast cyst 05/10/2014   CAD (coronary artery disease)    a.) LHC 06/04/2009 at [facility]; non-obstructive CAD. b.) CTA with FFR 10/08/2018: extensive mixed plaque proximal to mid LAD (51-69%); Coronary Ca score 217; FFR 0.71 dPDA, 0.86 mLAD, 0.87 dLCx.   CCF (congestive cardiac failure) (HCC)  06/06/2008   a.) 30% EF. b.) TTE 06/03/2011: EF >55%; triv MR. c.) TTE 04/27/2018: EF 55%; mild LVH; triv PR, mild MR/TR; G1DD.   Cellulitis of left lower extremity 09/18/2022   Chest pain with high risk for cardiac etiology 07/02/2016   Chronic use of opiate drug for therapeutic purpose 02/13/2022   Formatting of this note might be different from the original. [facility] Pain Contract signed on 04/18/16 & updated 03/04/17; UDS done on 04/18/16.   Complication of anesthesia    Diabetic foot infection (HCC) 03/13/2015   Diabetic foot ulcer (HCC) 02/08/2021   Diabetic ulcer of left heel associated with type 2 diabetes mellitus (HCC) 02/08/2021   Eczema    Family history of adverse reaction to anesthesia    a.) PONV in mother and grandmother   Gas gangrene (HCC) 09/18/2022   GERD (gastroesophageal reflux disease) 05/10/2014   History of kidney stones    History of MSSA bacteremia due to osteomyelitis L foot 09/18/22 10/22/2022   HLD (hyperlipidemia)    Hyponatremia 03/13/2015   Migraines    Motion sickness    MSSA bacteremia 09/20/2022   Osteomyelitis (HCC) 09/18/2022   Other acute osteomyelitis, left ankle and foot (HCC) 02/13/2022   Panic attacks    Peripheral edema 04/27/2018   Pneumonia    PONV (postoperative nausea and vomiting)    Sepsis secondary to diabetic foot infection 03/13/2015   Sepsis secondary to diabetic foot infection 03/13/2015   T2DM (type 2 diabetes  mellitus) (HCC)    Takotsubo cardiomyopathy / transient apical balooning syndrome / stress-induced cardiomyopathy 05/10/2014   Unspecified essential hypertension      Past Surgical History:  Procedure Laterality Date   AMPUTATION Left 09/19/2022   Procedure: AMPUTATION BELOW KNEE;  Surgeon: Renford Dills, MD;  Location: [facility];  Service: Vascular;  Laterality: Left;   AMPUTATION Left 09/24/2022   Procedure: AMPUTATION BELOW KNEE WITH WOUND CLOSURE;  Surgeon: Renford Dills, MD;  Location: [facility];  Service:  Vascular;  Laterality: Left;   AMPUTATION TOE Left 03/16/2015   Procedure: left fifth toe amputation with incision and drainage;  Surgeon: Gwyneth Revels, DPM;  Location: [facility];  Service: Podiatry;  Laterality: Left;   APPLICATION OF WOUND VAC Left 02/14/2021   Procedure: APPLICATION OF WOUND VAC;  Surgeon: Gwyneth Revels, DPM;  Location: [facility];  Service: Podiatry;  Laterality: Left;   APPLICATION OF WOUND VAC Left 04/16/2021   Procedure: APPLICATION OF WOUND VAC;  Surgeon: Peggye Form, DO;  Location: [facility];  Service: Plastics;  Laterality: Left;   CARDIAC CATHETERIZATION Left 06/04/2009   Procedure: CARDIAC CATHETERIZATION; Location: [facility]   CHOLECYSTECTOMY     DEBRIDEMENT AND CLOSURE WOUND Left 04/16/2021   Procedure: DEBRIDEMENT AND CLOSURE WOUND;  Surgeon: Peggye Form, DO;  Location: [facility];  Service: Plastics;  Laterality: Left;  1 hour   IR IMAGING GUIDED PORT INSERTION  11/18/2022   IRRIGATION AND DEBRIDEMENT FOOT Left 02/08/2021   Procedure: IRRIGATION AND DEBRIDEMENT FOOT - LFT HEEL ULCER;  Surgeon: Gwyneth Revels, DPM;  Location: [facility];  Service: Podiatry;  Laterality: Left;   IRRIGATION AND DEBRIDEMENT FOOT Left 02/14/2021   Procedure: IRRIGATION AND DEBRIDEMENT LEFT HEEL;  Surgeon: Gwyneth Revels, DPM;  Location: [facility];  Service: Podiatry;  Laterality: Left;   kidney stone removal     KNEE ARTHROSCOPY W/ MENISCAL REPAIR     NOSE SURGERY  01/07/1989   due to fracture   TEE WITHOUT CARDIOVERSION N/A 09/26/2022   Procedure: TRANSESOPHAGEAL ECHOCARDIOGRAM;  Surgeon: Clotilde Dieter, DO;  Location: [facility];  Service: Cardiovascular;  Laterality: N/A;   TOE AMPUTATION     second toe   TONSILLECTOMY     TOTAL KNEE ARTHROPLASTY  01/11/2011   Procedure: TOTAL KNEE ARTHROPLASTY;  Surgeon: Harvie Junior;  Location: [facility];  Service: Orthopedics;  Laterality: Right;  COMPUTER ASSISTED TOTAL KNEE REPLACEMENT      Current Outpatient Medications  Medication Sig Dispense Refill   acidophilus (RISAQUAD) CAPS capsule Take 1 capsule by mouth daily.     albuterol (PROVENTIL HFA;VENTOLIN HFA) 108 (90 Base) MCG/ACT inhaler Inhale 2 puffs into the lungs every 4 (four) hours as needed for wheezing or shortness of breath. Reported on 03/29/2015 1 Inhaler 3   albuterol (PROVENTIL) (2.5 MG/3ML) 0.083% nebulizer solution Take 2.5 mg by nebulization as needed.     ALPRAZolam (XANAX) 0.5 MG tablet TAKE 1/2 TO 1 TABLET BY MOUTH AS NEEDED FOR PANIC ATTACKS 30 tablet 2   apixaban (ELIQUIS) 2.5 MG TABS tablet Take 1 tablet (2.5 mg total) by mouth 2 (two) times daily. 60 tablet 2   ascorbic acid (VITAMIN C) 500 MG tablet Take 1 tablet (500 mg total) by mouth 2 (two) times daily. 60 tablet 0   aspirin EC 81 MG tablet Take 81 mg by mouth daily.     carvedilol (COREG) 6.25 MG tablet TAKE 1 TABLET BY MOUTH TWICE A DAY 180 tablet  1   cholecalciferol (VITAMIN D3) 25 MCG (1000 UNIT) tablet Take 1,000 Units by mouth daily.     Continuous Glucose Sensor (FREESTYLE LIBRE 2 SENSOR) MISC 1 each by Does not apply route every 14 (fourteen) days. (Patient not taking: Reported on 01/24/2023) 2 each 3   Cyanocobalamin (VITAMIN B 12) 500 MCG TABS Take by mouth daily at 8 pm.     desvenlafaxine (PRISTIQ) 100 MG 24 hr tablet TAKE 1 TABLET BY MOUTH EVERY DAY 90 tablet 2   dexamethasone (DECADRON) 4 MG tablet Take 2 tablets (8mg ) by mouth daily starting the day after carboplatin for 2 days. Take with food 30 tablet 1   dicyclomine (BENTYL) 10 MG capsule Take 1 capsule (10 mg total) by mouth 4 (four) times daily -  before meals and at bedtime. 60 capsule 1   Dulaglutide (TRULICITY) 1.5 MG/0.5ML SOAJ Inject 1.5 mg into the skin once a week. 2 mL 2   Ensure Max Protein (ENSURE MAX PROTEIN) LIQD Take 330 mLs (11 oz total) by mouth 2 (two) times daily.     fenofibrate 54 MG tablet TAKE 1 TABLET BY MOUTH EVERY DAY 90 tablet 3   fluconazole  (DIFLUCAN) 200 MG tablet Take 1 tablet (200 mg total) by mouth daily. 30 tablet 3   gabapentin (NEURONTIN) 300 MG capsule Take 1 capsule (300 mg total) by mouth at bedtime as needed. 30 capsule 3   HYDROmorphone (DILAUDID) 2 MG tablet Take 1 tablet (2 mg total) by mouth every 4 (four) hours as needed for severe pain (pain score 7-10). 60 tablet 0   ibuprofen (ADVIL) 800 MG tablet TAKE 1 TABLET BY MOUTH EVERY 8 HOURS AS NEEDED 90 tablet 3   insulin NPH-regular Human (70-30) 100 UNIT/ML injection Inject 15 Units into the skin in the morning and at bedtime.     iron polysaccharides (NIFEREX) 150 MG capsule Take 1 capsule (150 mg total) by mouth daily. 30 capsule 2   lidocaine-prilocaine (EMLA) cream Apply to affected area once 30 g 3   lisinopril (ZESTRIL) 10 MG tablet Take 1 tablet (10 mg total) by mouth every morning AND 0.5 tablets (5 mg total) at bedtime. 45 tablet 11   metFORMIN (GLUCOPHAGE) 500 MG tablet TAKE TWO TABLETS EACH MORNING. AND 3 TABLETS IN THE EVENING AS DIRECTED 150 tablet 5   methocarbamol (ROBAXIN) 500 MG tablet Take 1 tablet (500 mg total) by mouth every 8 (eight) hours as needed for muscle spasms. 30 tablet 0   mirtazapine (REMERON) 7.5 MG tablet TAKE 1 TABLET BY MOUTH AT BEDTIME. 90 tablet 2   montelukast (SINGULAIR) 10 MG tablet TAKE 1 TABLET BY MOUTH EVERYDAY AT BEDTIME 90 tablet 1   Multiple Vitamin (MULTIVITAMIN WITH MINERALS) TABS tablet Take 1 tablet by mouth daily. 30 tablet 2   naloxone (NARCAN) nasal spray 4 mg/0.1 mL SPRAY 1 SPRAY INTO ONE NOSTRIL AS DIRECTED FOR OPIOID OVERDOSE (TURN PERSON ON SIDE AFTER DOSE. IF NO RESPONSE IN 2-3 MINUTES OR PERSON RESPONDS BUT RELAPSES, REPEAT USING A NEW SPRAY DEVICE AND SPRAY INTO THE OTHER NOSTRIL. CALL 911 AFTER USE.) * EMERGENCY USE ONLY * 1 each 0   nystatin (MYCOSTATIN/NYSTOP) powder APPLY TOPICALLY TWICE DAILY AS NEEDED 30 g 3   nystatin ointment (MYCOSTATIN) Apply 1 Application topically 2 (two) times daily. 30 g 0    ondansetron (ZOFRAN) 8 MG tablet Take 1 tablet (8 mg total) by mouth every 8 (eight) hours as needed for nausea or vomiting. Start on the  third day after carboplatin. 30 tablet 1   ondansetron (ZOFRAN-ODT) 8 MG disintegrating tablet Take 1 tablet (8 mg total) by mouth every 8 (eight) hours as needed for nausea or vomiting. 90 tablet 3   pantoprazole (PROTONIX) 40 MG tablet TAKE 1 TABLET BY MOUTH EVERY DAY 90 tablet 3   pravastatin (PRAVACHOL) 40 MG tablet TAKE 1 TABLET BY MOUTH EVERY DAY 30 tablet 5   prochlorperazine (COMPAZINE) 10 MG tablet Take 1 tablet (10 mg total) by mouth every 6 (six) hours as needed for nausea or vomiting. 30 tablet 1   Ubrogepant (UBRELVY) 100 MG TABS Take 1 tablet (100 mg total) by mouth as needed (migraine). 12 tablet 1   zinc sulfate 220 (50 Zn) MG capsule Take 1 capsule (220 mg total) by mouth daily. 30 capsule 0   No current facility-administered medications for this visit.    Allergies:   Shellfish allergy, Codeine, Morphine and codeine, Rosuvastatin, Percocet [oxycodone-acetaminophen], Sulfa antibiotics, and Theophyllines    Social History:   reports that she quit smoking about 37 years ago. Her smoking use included cigarettes. She has never used smokeless tobacco. She reports that she does not drink alcohol and does not use drugs.   Family History:  family history includes Anxiety disorder in her sister; Asthma in her mother, sister, and sister; Colon cancer in her maternal grandmother; Crohn's disease in an other family member; GER disease in an other family member; Hypertension in her brother, father, and mother; Kidney cancer in her paternal grandfather; Lung cancer (age of onset: 92) in her paternal grandmother; Lung cancer (age of onset: 81) in her paternal aunt; Lung cancer (age of onset: 79) in her father; Lymphoma in her maternal grandmother; Melanoma in her maternal uncle; Melanoma (age of onset: 50) in her maternal grandfather; Ovarian cancer (age of  onset: 58) in her maternal aunt; Stomach cancer in her maternal grandmother; Transient ischemic attack in her mother.    ROS:     Review of Systems  Constitutional: Negative.   HENT: Negative.    Eyes: Negative.   Respiratory: Negative.    Gastrointestinal: Negative.   Genitourinary: Negative.   Musculoskeletal: Negative.   Skin: Negative.   Neurological: Negative.   Endo/Heme/Allergies: Negative.   Psychiatric/Behavioral: Negative.    All other systems reviewed and are negative.     All other systems are reviewed and negative.    PHYSICAL EXAM: VS:  BP 128/62   Pulse 77   Ht 5\' 9"  (1.753 m)   Wt 247 lb (112 kg)   LMP 02/08/2013 (Within Months)   SpO2 99%   BMI 36.48 kg/m  , BMI Body mass index is 36.48 kg/m. Last weight:  Wt Readings from Last 3 Encounters:  01/28/23 247 lb (112 kg)  01/24/23 255 lb 8 oz (115.9 kg)  01/15/23 256 lb 11.2 oz (116.4 kg)     Physical Exam Constitutional:      Appearance: Normal appearance.  Cardiovascular:     Rate and Rhythm: Normal rate and regular rhythm.     Heart sounds: Normal heart sounds.  Pulmonary:     Effort: Pulmonary effort is normal.     Breath sounds: Normal breath sounds.  Musculoskeletal:     Right lower leg: No edema.     Left lower leg: No edema.  Neurological:     Mental Status: She is alert.       EKG: NSR, NO ACUTE CHANGES 80/MINLOW VOLTAGE  Recent Labs: 08/07/2022: TSH 2.160 10/23/2022:  B Natriuretic Peptide 42.8 10/30/2022: Magnesium 1.7 01/24/2023: ALT 21; BUN 33; Creatinine 0.81; Hemoglobin 10.2; Platelet Count 110; Potassium 4.3; Sodium 137    Lipid Panel    Component Value Date/Time   CHOL 127 08/07/2022 1210   CHOL 108 05/28/2011 0417   TRIG 157 (H) 08/07/2022 1210   TRIG 184 05/28/2011 0417   HDL 37 (L) 08/07/2022 1210   HDL 24 (L) 05/28/2011 0417   CHOLHDL 3.4 08/07/2022 1210   VLDL 37 05/28/2011 0417   LDLCALC 63 08/07/2022 1210   LDLCALC 47 05/28/2011 0417      Other  studies Reviewed: Additional studies/ records that were reviewed today include:  Review of the above records demonstrates:       No data to display            ASSESSMENT AND PLAN:    ICD-10-CM   1. Preoperative clearance  Z01.818    Patient is asymptomatic for surgery requiring hysterectomy/abdominal surgery at [facility].  Advise proceeding with surgery. EKG, today unremarkable.    2. Essential hypertension  I10     3. Type 2 diabetes mellitus with diabetic nephropathy, with long-term current use of insulin (HCC)  E11.21    Z79.4     4. Takotsubo cardiomyopathy  I51.81    LVEF is normal and patient is optimized on medical therapy.  Last ejection fraction done on echocardiogram on 9/24 was normal.    5. S/P BKA (below knee amputation), left [facility])  Z89.512        Problem List Items Addressed This Visit       Cardiovascular and Mediastinum   Takotsubo cardiomyopathy (Chronic)   Essential hypertension     Endocrine   Diabetes mellitus, type 2 (HCC) (Chronic)     Other   S/P left  BKA 09/24/22 secondary to osteomyelitis (below knee amputation) [facility])   Other Visit Diagnoses       Preoperative clearance    -  Primary   Patient is asymptomatic for surgery requiring hysterectomy/abdominal surgery at [facility].  Advise proceeding with surgery. EKG, today unremarkable.          Disposition:   Return in about 2 months (around 03/28/2023).    Total time spent: 50 minutes  Signed,  Adrian Blackwater, MD  01/28/2023 2:24 PM    [facility]

## 2023-01-29 ENCOUNTER — Encounter: Payer: Self-pay | Admitting: Oncology

## 2023-01-29 ENCOUNTER — Encounter: Payer: Self-pay | Admitting: Cardiovascular Disease

## 2023-01-29 NOTE — Progress Notes (Signed)
Patient consented and enrolled in [facility] services on 12/02/22. Patient scheduled initial behavioral health evaluation for 12/11/22.

## 2023-01-30 DIAGNOSIS — R262 Difficulty in walking, not elsewhere classified: Secondary | ICD-10-CM | POA: Diagnosis not present

## 2023-01-30 DIAGNOSIS — S88012D Complete traumatic amputation at knee level, left lower leg, subsequent encounter: Secondary | ICD-10-CM | POA: Diagnosis not present

## 2023-01-30 DIAGNOSIS — S88112A Complete traumatic amputation at level between knee and ankle, left lower leg, initial encounter: Secondary | ICD-10-CM | POA: Diagnosis not present

## 2023-02-03 DIAGNOSIS — R262 Difficulty in walking, not elsewhere classified: Secondary | ICD-10-CM | POA: Diagnosis not present

## 2023-02-03 DIAGNOSIS — S88012D Complete traumatic amputation at knee level, left lower leg, subsequent encounter: Secondary | ICD-10-CM | POA: Diagnosis not present

## 2023-02-07 DIAGNOSIS — F411 Generalized anxiety disorder: Secondary | ICD-10-CM | POA: Diagnosis not present

## 2023-02-07 DIAGNOSIS — C482 Malignant neoplasm of peritoneum, unspecified: Secondary | ICD-10-CM | POA: Diagnosis not present

## 2023-02-10 DIAGNOSIS — R262 Difficulty in walking, not elsewhere classified: Secondary | ICD-10-CM | POA: Diagnosis not present

## 2023-02-10 DIAGNOSIS — S88012D Complete traumatic amputation at knee level, left lower leg, subsequent encounter: Secondary | ICD-10-CM | POA: Diagnosis not present

## 2023-02-12 DIAGNOSIS — R262 Difficulty in walking, not elsewhere classified: Secondary | ICD-10-CM | POA: Diagnosis not present

## 2023-02-12 DIAGNOSIS — S88012D Complete traumatic amputation at knee level, left lower leg, subsequent encounter: Secondary | ICD-10-CM | POA: Diagnosis not present

## 2023-02-13 ENCOUNTER — Inpatient Hospital Stay: Payer: 59 | Attending: Oncology | Admitting: Hospice and Palliative Medicine

## 2023-02-13 DIAGNOSIS — C482 Malignant neoplasm of peritoneum, unspecified: Secondary | ICD-10-CM

## 2023-02-13 NOTE — Progress Notes (Signed)
VM left. Will reschedule. 

## 2023-02-17 DIAGNOSIS — R262 Difficulty in walking, not elsewhere classified: Secondary | ICD-10-CM | POA: Diagnosis not present

## 2023-02-17 DIAGNOSIS — S88012D Complete traumatic amputation at knee level, left lower leg, subsequent encounter: Secondary | ICD-10-CM | POA: Diagnosis not present

## 2023-02-18 ENCOUNTER — Other Ambulatory Visit: Payer: Self-pay | Admitting: Oncology

## 2023-02-18 DIAGNOSIS — Z79899 Other long term (current) drug therapy: Secondary | ICD-10-CM | POA: Diagnosis not present

## 2023-02-18 DIAGNOSIS — I25119 Atherosclerotic heart disease of native coronary artery with unspecified angina pectoris: Secondary | ICD-10-CM | POA: Diagnosis not present

## 2023-02-18 DIAGNOSIS — Z7985 Long-term (current) use of injectable non-insulin antidiabetic drugs: Secondary | ICD-10-CM | POA: Diagnosis not present

## 2023-02-18 DIAGNOSIS — I509 Heart failure, unspecified: Secondary | ICD-10-CM | POA: Diagnosis not present

## 2023-02-18 DIAGNOSIS — Z7952 Long term (current) use of systemic steroids: Secondary | ICD-10-CM | POA: Diagnosis not present

## 2023-02-18 DIAGNOSIS — Z01818 Encounter for other preprocedural examination: Secondary | ICD-10-CM | POA: Diagnosis not present

## 2023-02-18 DIAGNOSIS — I10 Essential (primary) hypertension: Secondary | ICD-10-CM | POA: Diagnosis not present

## 2023-02-18 DIAGNOSIS — I110 Hypertensive heart disease with heart failure: Secondary | ICD-10-CM | POA: Diagnosis not present

## 2023-02-18 DIAGNOSIS — Z7984 Long term (current) use of oral hypoglycemic drugs: Secondary | ICD-10-CM | POA: Diagnosis not present

## 2023-02-18 DIAGNOSIS — Z89512 Acquired absence of left leg below knee: Secondary | ICD-10-CM | POA: Diagnosis not present

## 2023-02-18 DIAGNOSIS — E1140 Type 2 diabetes mellitus with diabetic neuropathy, unspecified: Secondary | ICD-10-CM | POA: Diagnosis not present

## 2023-02-18 DIAGNOSIS — Z794 Long term (current) use of insulin: Secondary | ICD-10-CM | POA: Diagnosis not present

## 2023-02-18 DIAGNOSIS — Z87891 Personal history of nicotine dependence: Secondary | ICD-10-CM | POA: Diagnosis not present

## 2023-02-18 DIAGNOSIS — D649 Anemia, unspecified: Secondary | ICD-10-CM | POA: Diagnosis not present

## 2023-02-18 DIAGNOSIS — J4520 Mild intermittent asthma, uncomplicated: Secondary | ICD-10-CM | POA: Diagnosis not present

## 2023-02-18 DIAGNOSIS — E1169 Type 2 diabetes mellitus with other specified complication: Secondary | ICD-10-CM | POA: Diagnosis not present

## 2023-02-18 DIAGNOSIS — C569 Malignant neoplasm of unspecified ovary: Secondary | ICD-10-CM | POA: Diagnosis not present

## 2023-02-18 DIAGNOSIS — I5181 Takotsubo syndrome: Secondary | ICD-10-CM | POA: Diagnosis not present

## 2023-02-19 ENCOUNTER — Encounter: Payer: Self-pay | Admitting: Oncology

## 2023-02-20 DIAGNOSIS — N736 Female pelvic peritoneal adhesions (postinfective): Secondary | ICD-10-CM | POA: Diagnosis not present

## 2023-02-20 DIAGNOSIS — D251 Intramural leiomyoma of uterus: Secondary | ICD-10-CM | POA: Diagnosis not present

## 2023-02-20 DIAGNOSIS — C569 Malignant neoplasm of unspecified ovary: Secondary | ICD-10-CM | POA: Diagnosis not present

## 2023-02-20 DIAGNOSIS — C563 Malignant neoplasm of bilateral ovaries: Secondary | ICD-10-CM | POA: Diagnosis not present

## 2023-02-20 DIAGNOSIS — C786 Secondary malignant neoplasm of retroperitoneum and peritoneum: Secondary | ICD-10-CM | POA: Diagnosis not present

## 2023-02-20 DIAGNOSIS — Z9221 Personal history of antineoplastic chemotherapy: Secondary | ICD-10-CM | POA: Diagnosis not present

## 2023-02-20 DIAGNOSIS — C7982 Secondary malignant neoplasm of genital organs: Secondary | ICD-10-CM | POA: Diagnosis not present

## 2023-02-21 ENCOUNTER — Other Ambulatory Visit: Payer: Self-pay | Admitting: Oncology

## 2023-02-23 ENCOUNTER — Other Ambulatory Visit: Payer: Self-pay | Admitting: Family

## 2023-02-25 ENCOUNTER — Telehealth: Payer: Self-pay

## 2023-02-25 NOTE — Telephone Encounter (Signed)
Called and spoke with Ms. Grout. She is doing well after surgery with some residual abdominal soreness. She will have her post operative appointment with Dr. Sonia Side at [facility] on 3/4 at 1100 and follow up with Dr Cathie Hoops to resume chemotherapy on 3/6 at 0800.

## 2023-02-28 DIAGNOSIS — F411 Generalized anxiety disorder: Secondary | ICD-10-CM | POA: Insufficient documentation

## 2023-03-02 DIAGNOSIS — S88112A Complete traumatic amputation at level between knee and ankle, left lower leg, initial encounter: Secondary | ICD-10-CM | POA: Diagnosis not present

## 2023-03-03 ENCOUNTER — Encounter: Payer: Self-pay | Admitting: Oncology

## 2023-03-04 ENCOUNTER — Other Ambulatory Visit: Payer: Self-pay | Admitting: Family

## 2023-03-05 ENCOUNTER — Other Ambulatory Visit: Payer: 59

## 2023-03-05 ENCOUNTER — Ambulatory Visit: Payer: 59 | Admitting: Oncology

## 2023-03-05 ENCOUNTER — Ambulatory Visit: Payer: 59

## 2023-03-06 ENCOUNTER — Encounter: Payer: Self-pay | Admitting: Oncology

## 2023-03-11 DIAGNOSIS — C569 Malignant neoplasm of unspecified ovary: Secondary | ICD-10-CM | POA: Diagnosis not present

## 2023-03-11 DIAGNOSIS — B372 Candidiasis of skin and nail: Secondary | ICD-10-CM | POA: Diagnosis not present

## 2023-03-12 ENCOUNTER — Ambulatory Visit: Payer: 59

## 2023-03-12 MED FILL — Fosaprepitant Dimeglumine For IV Infusion 150 MG (Base Eq): INTRAVENOUS | Qty: 5 | Status: AC

## 2023-03-13 ENCOUNTER — Inpatient Hospital Stay: Payer: 59

## 2023-03-13 ENCOUNTER — Encounter: Payer: Self-pay | Admitting: Oncology

## 2023-03-13 ENCOUNTER — Inpatient Hospital Stay (HOSPITAL_BASED_OUTPATIENT_CLINIC_OR_DEPARTMENT_OTHER): Payer: 59 | Admitting: Oncology

## 2023-03-13 ENCOUNTER — Inpatient Hospital Stay: Payer: 59 | Attending: Oncology

## 2023-03-13 ENCOUNTER — Other Ambulatory Visit: Payer: Self-pay

## 2023-03-13 VITALS — BP 158/71 | HR 80 | Temp 96.7°F | Resp 19 | Wt 265.7 lb

## 2023-03-13 VITALS — BP 168/72 | HR 82

## 2023-03-13 DIAGNOSIS — D508 Other iron deficiency anemias: Secondary | ICD-10-CM

## 2023-03-13 DIAGNOSIS — C563 Malignant neoplasm of bilateral ovaries: Secondary | ICD-10-CM | POA: Diagnosis not present

## 2023-03-13 DIAGNOSIS — C482 Malignant neoplasm of peritoneum, unspecified: Secondary | ICD-10-CM

## 2023-03-13 DIAGNOSIS — Z148 Genetic carrier of other disease: Secondary | ICD-10-CM | POA: Diagnosis not present

## 2023-03-13 DIAGNOSIS — R110 Nausea: Secondary | ICD-10-CM | POA: Diagnosis not present

## 2023-03-13 DIAGNOSIS — Z7901 Long term (current) use of anticoagulants: Secondary | ICD-10-CM | POA: Diagnosis not present

## 2023-03-13 DIAGNOSIS — T451X5A Adverse effect of antineoplastic and immunosuppressive drugs, initial encounter: Secondary | ICD-10-CM | POA: Diagnosis not present

## 2023-03-13 DIAGNOSIS — G629 Polyneuropathy, unspecified: Secondary | ICD-10-CM | POA: Insufficient documentation

## 2023-03-13 DIAGNOSIS — Z5111 Encounter for antineoplastic chemotherapy: Secondary | ICD-10-CM | POA: Insufficient documentation

## 2023-03-13 DIAGNOSIS — Z7982 Long term (current) use of aspirin: Secondary | ICD-10-CM | POA: Diagnosis not present

## 2023-03-13 DIAGNOSIS — C569 Malignant neoplasm of unspecified ovary: Secondary | ICD-10-CM

## 2023-03-13 DIAGNOSIS — C786 Secondary malignant neoplasm of retroperitoneum and peritoneum: Secondary | ICD-10-CM | POA: Diagnosis not present

## 2023-03-13 DIAGNOSIS — G620 Drug-induced polyneuropathy: Secondary | ICD-10-CM

## 2023-03-13 DIAGNOSIS — Z87891 Personal history of nicotine dependence: Secondary | ICD-10-CM | POA: Diagnosis not present

## 2023-03-13 DIAGNOSIS — Z1509 Genetic susceptibility to other malignant neoplasm: Secondary | ICD-10-CM | POA: Insufficient documentation

## 2023-03-13 LAB — CMP (CANCER CENTER ONLY)
ALT: 16 U/L (ref 0–44)
AST: 18 U/L (ref 15–41)
Albumin: 3.4 g/dL — ABNORMAL LOW (ref 3.5–5.0)
Alkaline Phosphatase: 45 U/L (ref 38–126)
Anion gap: 10 (ref 5–15)
BUN: 27 mg/dL — ABNORMAL HIGH (ref 6–20)
CO2: 24 mmol/L (ref 22–32)
Calcium: 8.8 mg/dL — ABNORMAL LOW (ref 8.9–10.3)
Chloride: 102 mmol/L (ref 98–111)
Creatinine: 0.88 mg/dL (ref 0.44–1.00)
GFR, Estimated: >60 mL/min (ref >60)
Glucose, Bld: 213 mg/dL — ABNORMAL HIGH (ref 70–99)
Potassium: 3.9 mmol/L (ref 3.5–5.1)
Sodium: 136 mmol/L (ref 135–145)
Total Bilirubin: 0.4 mg/dL (ref 0.0–1.2)
Total Protein: 6.9 g/dL (ref 6.5–8.1)

## 2023-03-13 LAB — CBC WITH DIFFERENTIAL (CANCER CENTER ONLY)
Abs Immature Granulocytes: 0.08 10*3/uL — ABNORMAL HIGH (ref 0.00–0.07)
Basophils Absolute: 0.1 10*3/uL (ref 0.0–0.1)
Basophils Relative: 1 %
Eosinophils Absolute: 0.6 10*3/uL — ABNORMAL HIGH (ref 0.0–0.5)
Eosinophils Relative: 8 %
HCT: 30.9 % — ABNORMAL LOW (ref 36.0–46.0)
Hemoglobin: 10 g/dL — ABNORMAL LOW (ref 12.0–15.0)
Immature Granulocytes: 1 %
Lymphocytes Relative: 33 %
Lymphs Abs: 2.5 10*3/uL (ref 0.7–4.0)
MCH: 33.2 pg (ref 26.0–34.0)
MCHC: 32.4 g/dL (ref 30.0–36.0)
MCV: 102.7 fL — ABNORMAL HIGH (ref 80.0–100.0)
Monocytes Absolute: 0.7 10*3/uL (ref 0.1–1.0)
Monocytes Relative: 9 %
Neutro Abs: 3.5 10*3/uL (ref 1.7–7.7)
Neutrophils Relative %: 48 %
Platelet Count: 256 10*3/uL (ref 150–400)
RBC: 3.01 MIL/uL — ABNORMAL LOW (ref 3.87–5.11)
RDW: 18.6 % — ABNORMAL HIGH (ref 11.5–15.5)
WBC Count: 7.4 10*3/uL (ref 4.0–10.5)
nRBC: 0 % (ref 0.0–0.2)

## 2023-03-13 LAB — IRON AND TIBC
Iron: 56 ug/dL (ref 28–170)
Saturation Ratios: 15 % (ref 10.4–31.8)
TIBC: 367 ug/dL (ref 250–450)
UIBC: 311 ug/dL

## 2023-03-13 LAB — RETIC PANEL
Immature Retic Fract: 19.3 % — ABNORMAL HIGH (ref 2.3–15.9)
RBC.: 3.05 MIL/uL — ABNORMAL LOW (ref 3.87–5.11)
Retic Count, Absolute: 113.1 10*3/uL (ref 19.0–186.0)
Retic Ct Pct: 3.8 % — ABNORMAL HIGH (ref 0.4–3.1)
Reticulocyte Hemoglobin: 31.6 pg (ref >27.9)

## 2023-03-13 LAB — FERRITIN: Ferritin: 57 ng/mL (ref 11–307)

## 2023-03-13 MED ORDER — SODIUM CHLORIDE 0.9 % IV SOLN
150.0000 mg | Freq: Once | INTRAVENOUS | Status: AC
  Administered 2023-03-13: 150 mg via INTRAVENOUS
  Filled 2023-03-13: qty 150

## 2023-03-13 MED ORDER — FAMOTIDINE IN NACL 20-0.9 MG/50ML-% IV SOLN
20.0000 mg | Freq: Once | INTRAVENOUS | Status: AC
  Administered 2023-03-13: 20 mg via INTRAVENOUS
  Filled 2023-03-13: qty 50

## 2023-03-13 MED ORDER — DEXAMETHASONE SODIUM PHOSPHATE 10 MG/ML IJ SOLN
10.0000 mg | Freq: Once | INTRAMUSCULAR | Status: AC
  Administered 2023-03-13: 10 mg via INTRAVENOUS
  Filled 2023-03-13: qty 1

## 2023-03-13 MED ORDER — CARBOPLATIN CHEMO INJECTION 600 MG/60ML
750.0000 mg | Freq: Once | INTRAVENOUS | Status: AC
  Administered 2023-03-13: 750 mg via INTRAVENOUS
  Filled 2023-03-13: qty 75

## 2023-03-13 MED ORDER — SODIUM CHLORIDE 0.9 % IV SOLN
175.0000 mg/m2 | Freq: Once | INTRAVENOUS | Status: AC
  Administered 2023-03-13: 360 mg via INTRAVENOUS
  Filled 2023-03-13: qty 60

## 2023-03-13 MED ORDER — PALONOSETRON HCL INJECTION 0.25 MG/5ML
0.2500 mg | Freq: Once | INTRAVENOUS | Status: AC
  Administered 2023-03-13: 0.25 mg via INTRAVENOUS
  Filled 2023-03-13: qty 5

## 2023-03-13 MED ORDER — HEPARIN SOD (PORK) LOCK FLUSH 100 UNIT/ML IV SOLN
500.0000 [IU] | Freq: Once | INTRAVENOUS | Status: AC | PRN
Start: 2023-03-13 — End: 2023-03-13
  Administered 2023-03-13: 500 [IU]
  Filled 2023-03-13: qty 5

## 2023-03-13 MED ORDER — DIPHENHYDRAMINE HCL 50 MG/ML IJ SOLN
50.0000 mg | Freq: Once | INTRAMUSCULAR | Status: AC
  Administered 2023-03-13: 50 mg via INTRAVENOUS
  Filled 2023-03-13: qty 1

## 2023-03-13 MED ORDER — SODIUM CHLORIDE 0.9 % IV SOLN
INTRAVENOUS | Status: DC
  Filled 2023-03-13: qty 250

## 2023-03-13 NOTE — Assessment & Plan Note (Addendum)
 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 Post treatment CT showed excellent response.  S/p Debulking R1 on 02/20/2023  Gynonc note was reviewed  Recommend additional 2-3 cycles of carboplatin taxol, repeat CT  Consider add PARP inhibitor adjuvantly  Labs are reviewed and discussed with patient. Proceed with cycle 5 carboplatin and taxol.     Khorana score 3  on Eliquis 2.5mg  BID, continue Aspirin 81mg  daily.  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

## 2023-03-13 NOTE — Assessment & Plan Note (Signed)
Chemotherapy treatment as planned. 

## 2023-03-13 NOTE — Assessment & Plan Note (Signed)
 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  Grade 1/ intermittent neuropathy of fingertips and toes.  Continue Gabapentin 300mg  PRN

## 2023-03-13 NOTE — Progress Notes (Signed)
 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  S/p debulking surgery on 2/13.  Starting adjuvant chemotherapy today of carboplatin and paclitaxel.    Met with patient during infusion. Reports that surgery went well. Her appetite is doing good.  She is eating mostly 2 meals a day.  Drinking ensure max protein at times.  Drinking diet soda, water, flavored soda.  Reports regular bowel movements.    Medications: reviewed  Labs: reviwed  Anthropometrics:   Weight 265 lb 11.2 oz today  255 lb 8 oz on 01/24/23 248 lb on 12/6 269 lb 3.2 oz on 11/4 259 lb on 11/5   NUTRITION DIAGNOSIS: Inadequate oral intake improved   INTERVENTION:  Encouraged good sources of lean protein and plant foods in diet.   Continue sugar free beverages  Patient has contact information    MONITORING, EVALUATION, GOAL: weight trends, intake   NEXT VISIT: Thursday, Mar 27 during infusion  Merlie Noga B. Freida Busman, RD, LDN Registered Dietitian [phone]

## 2023-03-13 NOTE — Assessment & Plan Note (Signed)
 We discussed about instructions of antiemetics.  She can use Compazine as instructed if Zofran only partially relieves her symptoms.

## 2023-03-13 NOTE — Progress Notes (Signed)
 Hematology/Oncology Progress note [phone] [phone]      CHIEF COMPLAINTS  High grade serous carcinoma of ovaries.   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 Post treatment CT showed excellent response.  S/p Debulking R1 on 02/20/2023  Gynonc note was reviewed  Recommend additional 2-3 cycles of carboplatin taxol, repeat CT  Consider add PARP inhibitor adjuvantly  Labs are reviewed and discussed with patient. Proceed with cycle 5 carboplatin and taxol.     Khorana score 3  on Eliquis 2.5mg  BID, continue Aspirin 81mg  daily.  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 Stable Hb  Chemotherapy-induced nausea We discussed about instructions of antiemetics.  She can use Compazine as instructed if Zofran only partially relieves her symptoms.   Encounter for antineoplastic chemotherapy Chemotherapy treatment as planned.   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  Grade 1/ intermittent neuropathy of fingertips and toes.  Continue Gabapentin 300mg  PRN   Follow up 3 weeks lab covering MD chemo.    All questions were answered. The patient knows to call the clinic with any problems, questions or concerns.  Rickard Patience, MD, PhD [facility] Hematology Oncology 03/13/2023    HISTORY OF PRESENTING ILLNESS:  Nancy [name] 58 y.o. female presents to establish care for  peritoneal carcinomatosis I have reviewed her chart and materials related to her cancer extensively and collaborated history with the patient. 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 Aortic atherosclerosis    11/06/2022 Initial Diagnosis   Primary peritoneal carcinomatosis   -presented to emergency room for evaluation of abdominal pain/bloating, poor oral intake, nonbloody nonbilious vomiting  Patient underwent paracentesis, cytology showed malignant cells. IHC can not be added.    11/14/2022 Imaging   CT chest w contrast  1. Prominent subcentimeter right retrocrural lymph nodes, metastatic disease can not be excluded. 2. Nonspecific ground-glass nodule of the superior portion of the right lower lobe measuring 7 mm. Recommend attention on follow-up. 3. Small left pleural effusion. 4. Partially visualized large volume abdominal ascites and peritoneal thickening, volume of ascites appears increased when compared with the prior CT. 5. Coronary artery calcifications and aortic Atherosclerosis (ICD10-I70.0).   11/18/2022 Procedure   Medi port placement by IR   11/18/2022 Procedure   Omentum biopsy showed  1. Omentum, biopsy,  :   - INVOLVEMENT BY HIGH-GRADE SEROUS CARCINOMA OF GYNECOLOGIC ORIGIN.   Diagnosis Note : The carcinoma is positive for cytokeratin 7, PAX8, WT1, and ER.  Cytokeratin 20 and GATA3 are negative.  P53 is overexpressed.  The morphologic findings in conjunction with the pattern of immunohistochemistry support the  above diagnosis.   MyRIAD HRD positive    11/21/2022 -  Chemotherapy   Patient is on Treatment Plan : Carboplatin + Paclitaxel q21d      Genetic Testing   Pathogenic variant in BRIP1 called  p.[id number]* (c.1871C>A) identified on the [employer] CancerNext-Expanded+RNA panel. The report date is 11/25/2022.  The CancerNext-Expanded +  RNAinsight gene panel offered by [employer] and includes sequencing and rearrangement analysis for the following 71 genes: AIP, ALK, APC*, ATM*, AXIN2, BAP1, BARD1, BMPR1A, BRCA1*, BRCA2*, BRIP1*, CDC73, CDH1*,CDK4, CDKN1B, CDKN2A, CHEK2*, CTNNA1, DICER1, FH, FLCN, KIF1B, LZTR1, MAX, MEN1, MET, MLH1*, MSH2*, MSH3, MSH6*, MUTYH*, NF1*, NF2, NTHL1, PALB2*, PHOX2B, PMS2*, POT1, PRKAR1A, PTCH1, PTEN*, RAD51C*, RAD51D*,RB1, RET, SDHA, SDHAF2, SDHB, SDHC, SDHD, SMAD4, SMARCA4, SMARCB1, SMARCE1, STK11, SUFU, TMEM127, TP53*,TSC1, TSC2, VHL; EGFR, EGLN1, HOXB13, KIT, MITF, PDGFRA, POLD1 and POLE (sequencing only); EPCAM and GREM1 (deletion/duplication only).    01/13/2023 Imaging   CT chest abdomen pelvis w contrast showed  Decreasing ascites, trace residual. The areas of omental caking and nodularity along the perineum is decreasing as well with significant residual particularly in the upper anterior abdomen.   Stable small mesenteric and retroperitoneal nodes. Resolved left pleural effusion. No developing new mass lesion. Small left renal angiomyolipoma and upper pole nonobstructing renal stone.   02/20/2023 Surgery    Interval debulking R1 - robotic assisted laparoscopic total hysterectomy, bilateral salpingo-oophorectomy, lysis of adhesions, peritoneal stripping, and omentectomy with stage IIIC high grade serous carcinoma of bilateral ovaries   A. Uterus, bilateral fallopian tubes, and bilateral ovaries, total hysterectomy and bilateral salpingo-oophorectomy: Residual high grade serous carcinoma (1.6 cm) involving bilateral ovaries, left fallopian tube, and uterine serosa.   Additional findings:  Uterus with leiomyomata (up to 1.1 cm) Right fallopian tube with no pathologic diagnosis See synoptic report.   B. Peritoneal nodules, excision: High grade serous carcinoma (up to 0.1 cm).  C. Omentum, omentectomy:  High grade serous carcinoma (0.7 cm). Moderate treatment response (CRS2).      03/04/2023 Cancer Staging   Staging form: Ovary, AJCC 7th Edition - Pathologic stage from 03/04/2023: FIGO Stage IIIC (cM0) - Signed by Rickard Patience, MD on 03/13/2023 Stage prefix: Initial diagnosis Laterality: Bilateral   History of sepsis secondary to diabetic foot infection/osteomyelitis status post left BKA done on 09/24/2022,    Today she presents to for evaluation prior to chemotherapy. Accompanied by significant other.  + nausea, manageable with antiemetics.  No other new complaints. S/p debulking surgery on 02/20/2023.  Denies any abdominal pain, vomiting, diarrhea. Appetite is good.  Intermittent fingertip and toe numbness, she take gabapentin PRN    MEDICAL HISTORY:  Past Medical History:  Diagnosis Date   Abnormal cardiovascular stress test 09/21/2018   Formatting of this note might be different from the original. Lexiscan Myoview 09/16/2018 revealed mild anterior ischemia   Adverse effect of motion 05/10/2014   AKI (acute kidney injury) (HCC) 10/22/2022   Anxiety    Arthritis    r knee   Asthma    Breast cyst 05/10/2014   CAD (coronary artery disease)    a.) LHC 06/04/2009 at [facility]; non-obstructive CAD. b.) CTA with FFR 10/08/2018: extensive mixed plaque proximal to mid LAD (51-69%); Coronary Ca score 217; FFR 0.71 dPDA, 0.86 mLAD, 0.87 dLCx.   CCF (congestive cardiac failure) (HCC) 06/06/2008   a.) 30% EF. b.) TTE 06/03/2011: EF >55%; triv MR. c.) TTE 04/27/2018: EF 55%; mild LVH; triv PR, mild MR/TR; G1DD.   Cellulitis of left lower extremity 09/18/2022   Chest pain with high risk for cardiac etiology 07/02/2016   Chronic use of opiate drug for therapeutic purpose 02/13/2022   Formatting of this note might be different from the original. [facility] Pain Contract signed on 04/18/16 & updated  03/04/17; UDS done on 04/18/16.   Complication of anesthesia    Diabetic foot infection (HCC) 03/13/2015   Diabetic foot ulcer (HCC) 02/08/2021   Diabetic ulcer of left heel associated with type 2  diabetes mellitus (HCC) 02/08/2021   Eczema    Family history of adverse reaction to anesthesia    a.) PONV in mother and grandmother   Gas gangrene (HCC) 09/18/2022   GERD (gastroesophageal reflux disease) 05/10/2014   History of kidney stones    History of MSSA bacteremia due to osteomyelitis L foot 09/18/22 10/22/2022   HLD (hyperlipidemia)    Hyponatremia 03/13/2015   Migraines    Motion sickness    MSSA bacteremia 09/20/2022   Osteomyelitis (HCC) 09/18/2022   Other acute osteomyelitis, left ankle and foot (HCC) 02/13/2022   Panic attacks    Peripheral edema 04/27/2018   Pneumonia    PONV (postoperative nausea and vomiting)    Sepsis secondary to diabetic foot infection 03/13/2015   Sepsis secondary to diabetic foot infection 03/13/2015   T2DM (type 2 diabetes mellitus) (HCC)    Takotsubo cardiomyopathy / transient apical balooning syndrome / stress-induced cardiomyopathy 05/10/2014   Unspecified essential hypertension     SURGICAL HISTORY: Past Surgical History:  Procedure Laterality Date   AMPUTATION Left 09/19/2022   Procedure: AMPUTATION BELOW KNEE;  Surgeon: Renford Dills, MD;  Location: [facility];  Service: Vascular;  Laterality: Left;   AMPUTATION Left 09/24/2022   Procedure: AMPUTATION BELOW KNEE WITH WOUND CLOSURE;  Surgeon: Renford Dills, MD;  Location: [facility];  Service: Vascular;  Laterality: Left;   AMPUTATION TOE Left 03/16/2015   Procedure: left fifth toe amputation with incision and drainage;  Surgeon: Gwyneth Revels, DPM;  Location: [facility];  Service: Podiatry;  Laterality: Left;   APPLICATION OF WOUND VAC Left 02/14/2021   Procedure: APPLICATION OF WOUND VAC;  Surgeon: Gwyneth Revels, DPM;  Location: [facility];  Service: Podiatry;  Laterality: Left;   APPLICATION OF WOUND VAC Left 04/16/2021   Procedure: APPLICATION OF WOUND VAC;  Surgeon: Peggye Form, DO;  Location: [facility];  Service: Plastics;  Laterality: Left;   CARDIAC  CATHETERIZATION Left 06/04/2009   Procedure: CARDIAC CATHETERIZATION; Location: [facility]   CHOLECYSTECTOMY     DEBRIDEMENT AND CLOSURE WOUND Left 04/16/2021   Procedure: DEBRIDEMENT AND CLOSURE WOUND;  Surgeon: Peggye Form, DO;  Location: [facility];  Service: Plastics;  Laterality: Left;  1 hour   IR IMAGING GUIDED PORT INSERTION  11/18/2022   IRRIGATION AND DEBRIDEMENT FOOT Left 02/08/2021   Procedure: IRRIGATION AND DEBRIDEMENT FOOT - LFT HEEL ULCER;  Surgeon: Gwyneth Revels, DPM;  Location: [facility];  Service: Podiatry;  Laterality: Left;   IRRIGATION AND DEBRIDEMENT FOOT Left 02/14/2021   Procedure: IRRIGATION AND DEBRIDEMENT LEFT HEEL;  Surgeon: Gwyneth Revels, DPM;  Location: [facility];  Service: Podiatry;  Laterality: Left;   kidney stone removal     KNEE ARTHROSCOPY W/ MENISCAL REPAIR     NOSE SURGERY  01/07/1989   due to fracture   TEE WITHOUT CARDIOVERSION N/A 09/26/2022   Procedure: TRANSESOPHAGEAL ECHOCARDIOGRAM;  Surgeon: Clotilde Dieter, DO;  Location: [facility];  Service: Cardiovascular;  Laterality: N/A;   TOE AMPUTATION     second toe   TONSILLECTOMY     TOTAL KNEE ARTHROPLASTY  01/11/2011   Procedure: TOTAL KNEE ARTHROPLASTY;  Surgeon: Harvie Junior;  Location: [facility];  Service: Orthopedics;  Laterality: Right;  COMPUTER ASSISTED TOTAL KNEE  REPLACEMENT    SOCIAL HISTORY: Social History   Socioeconomic History   Marital status: Married    Spouse name: Not on file   Number of children: Not on file   Years of education: Not on file   Highest education level: Not on file  Occupational History   Not on file  Tobacco Use   Smoking status: Former    Current packs/day: 0.00    Types: Cigarettes    Quit date: 03/12/1985    Years since quitting: 38.0   Smokeless tobacco: Never  Vaping Use   Vaping status: Never Used  Substance and Sexual Activity   Alcohol use: No   Drug use: No   Sexual activity: Yes    Birth control/protection:  Post-menopausal  Other Topics Concern   Not on file  Social History Narrative   Wife, [name], at bedside. One indoor pet, dog.   Social Drivers of Corporate investment banker Strain: Not on file  Food Insecurity: No Food Insecurity (10/22/2022)   Hunger Vital Sign    Worried About Running Out of Food in the Last Year: Never true    Ran Out of Food in the Last Year: Never true  Transportation Needs: No Transportation Needs (10/22/2022)   PRAPARE - Administrator, Civil Service (Medical): No    Lack of Transportation (Non-Medical): No  Physical Activity: Not on file  Stress: Not on file  Social Connections: Not on file  Intimate Partner Violence: Not At Risk (10/22/2022)   Humiliation, Afraid, Rape, and Kick questionnaire    Fear of Current or Ex-Partner: No    Emotionally Abused: No    Physically Abused: No    Sexually Abused: No    FAMILY HISTORY: Family History  Problem Relation Age of Onset   Transient ischemic attack Mother    Asthma Mother    Hypertension Mother    Lung cancer Father 58   Hypertension Father    Asthma Sister    Anxiety disorder Sister    Asthma Sister    Hypertension Brother    Ovarian cancer Maternal Aunt 85   Melanoma Maternal Uncle        dx 52s   Lung cancer Paternal Aunt 8   Stomach cancer Maternal Grandmother    Colon cancer Maternal Grandmother    Lymphoma Maternal Grandmother    Melanoma Maternal Grandfather 72       sun exposure   Lung cancer Paternal Grandmother 41   Kidney cancer Paternal Grandfather        dx early   Crohn's disease Other    GER disease Other    Breast cancer Neg Hx     ALLERGIES:  is allergic to shellfish allergy, codeine, morphine and codeine, rosuvastatin, percocet [oxycodone-acetaminophen], sulfa antibiotics, and theophyllines.  MEDICATIONS:  Current Outpatient Medications  Medication Sig Dispense Refill   acidophilus (RISAQUAD) CAPS capsule Take 1 capsule by mouth daily.     albuterol  (PROVENTIL HFA;VENTOLIN HFA) 108 (90 Base) MCG/ACT inhaler Inhale 2 puffs into the lungs every 4 (four) hours as needed for wheezing or shortness of breath. Reported on 03/29/2015 1 Inhaler 3   albuterol (PROVENTIL) (2.5 MG/3ML) 0.083% nebulizer solution Take 2.5 mg by nebulization as needed.     ALPRAZolam (XANAX) 0.5 MG tablet TAKE 1/2 TO 1 TABLET BY MOUTH AS NEEDED FOR PANIC ATTACKS 30 tablet 2   ascorbic acid (VITAMIN C) 500 MG tablet Take 1 tablet (500 mg total) by mouth 2 (  two) times daily. 60 tablet 0   aspirin EC 81 MG tablet Take 81 mg by mouth daily.     carvedilol (COREG) 6.25 MG tablet TAKE 1 TABLET BY MOUTH TWICE A DAY 180 tablet 1   cholecalciferol (VITAMIN D3) 25 MCG (1000 UNIT) tablet Take 1,000 Units by mouth daily.     Cyanocobalamin (VITAMIN B 12) 500 MCG TABS Take by mouth daily at 8 pm.     desvenlafaxine (PRISTIQ) 100 MG 24 hr tablet TAKE 1 TABLET BY MOUTH EVERY DAY 90 tablet 2   dexamethasone (DECADRON) 4 MG tablet Take 2 tablets (8mg ) by mouth daily starting the day after carboplatin for 2 days. Take with food 30 tablet 1   dicyclomine (BENTYL) 10 MG capsule Take 1 capsule (10 mg total) by mouth 4 (four) times daily -  before meals and at bedtime. 60 capsule 1   Dulaglutide (TRULICITY) 1.5 MG/0.5ML SOAJ Inject 1.5 mg into the skin once a week. 2 mL 2   ELIQUIS 2.5 MG TABS tablet TAKE 1 TABLET BY MOUTH TWICE A DAY 60 tablet 2   Ensure Max Protein (ENSURE MAX PROTEIN) LIQD Take 330 mLs (11 oz total) by mouth 2 (two) times daily.     fenofibrate 54 MG tablet TAKE 1 TABLET BY MOUTH EVERY DAY 90 tablet 3   fluconazole (DIFLUCAN) 200 MG tablet TAKE 1 TABLET BY MOUTH EVERY DAY 30 tablet 3   gabapentin (NEURONTIN) 300 MG capsule Take 1 capsule (300 mg total) by mouth at bedtime as needed. 30 capsule 3   HYDROmorphone (DILAUDID) 2 MG tablet Take 1 tablet (2 mg total) by mouth every 4 (four) hours as needed for severe pain (pain score 7-10). 60 tablet 0   ibuprofen (ADVIL) 800 MG  tablet TAKE 1 TABLET BY MOUTH EVERY 8 HOURS AS NEEDED 90 tablet 3   insulin NPH-regular Human (70-30) 100 UNIT/ML injection Inject 15 Units into the skin in the morning and at bedtime.     iron polysaccharides (NIFEREX) 150 MG capsule Take 1 capsule (150 mg total) by mouth daily. 30 capsule 2   lidocaine-prilocaine (EMLA) cream Apply to affected area once 30 g 3   lisinopril (ZESTRIL) 10 MG tablet Take 1 tablet (10 mg total) by mouth every morning AND 0.5 tablets (5 mg total) at bedtime. 45 tablet 11   metFORMIN (GLUCOPHAGE) 500 MG tablet TAKE TWO TABLETS EACH MORNING. AND 3 TABLETS IN THE EVENING AS DIRECTED 150 tablet 5   methocarbamol (ROBAXIN) 500 MG tablet Take 1 tablet (500 mg total) by mouth every 8 (eight) hours as needed for muscle spasms. 30 tablet 0   mirtazapine (REMERON) 7.5 MG tablet TAKE 1 TABLET BY MOUTH AT BEDTIME. 90 tablet 2   montelukast (SINGULAIR) 10 MG tablet TAKE 1 TABLET BY MOUTH EVERYDAY AT BEDTIME 90 tablet 1   Multiple Vitamin (MULTIVITAMIN WITH MINERALS) TABS tablet Take 1 tablet by mouth daily. 30 tablet 2   naloxone (NARCAN) nasal spray 4 mg/0.1 mL SPRAY 1 SPRAY INTO ONE NOSTRIL AS DIRECTED FOR OPIOID OVERDOSE (TURN PERSON ON SIDE AFTER DOSE. IF NO RESPONSE IN 2-3 MINUTES OR PERSON RESPONDS BUT RELAPSES, REPEAT USING A NEW SPRAY DEVICE AND SPRAY INTO THE OTHER NOSTRIL. CALL 911 AFTER USE.) * EMERGENCY USE ONLY * 1 each 0   nystatin (MYCOSTATIN/NYSTOP) powder APPLY TOPICALLY TWICE DAILY AS NEEDED 30 g 3   nystatin ointment (MYCOSTATIN) Apply 1 Application topically 2 (two) times daily. 30 g 0   ondansetron (ZOFRAN) 8  MG tablet Take 1 tablet (8 mg total) by mouth every 8 (eight) hours as needed for nausea or vomiting. Start on the third day after carboplatin. 30 tablet 1   ondansetron (ZOFRAN-ODT) 8 MG disintegrating tablet Take 1 tablet (8 mg total) by mouth every 8 (eight) hours as needed for nausea or vomiting. 90 tablet 3   pantoprazole (PROTONIX) 40 MG tablet TAKE 1  TABLET BY MOUTH EVERY DAY 90 tablet 3   pravastatin (PRAVACHOL) 40 MG tablet TAKE 1 TABLET BY MOUTH EVERY DAY 30 tablet 5   prochlorperazine (COMPAZINE) 10 MG tablet Take 1 tablet (10 mg total) by mouth every 6 (six) hours as needed for nausea or vomiting. 30 tablet 1   Ubrogepant (UBRELVY) 100 MG TABS Take 1 tablet (100 mg total) by mouth as needed (migraine). 12 tablet 1   zinc sulfate 220 (50 Zn) MG capsule Take 1 capsule (220 mg total) by mouth daily. 30 capsule 0   Continuous Glucose Sensor (FREESTYLE LIBRE 2 SENSOR) MISC 1 each by Does not apply route every 14 (fourteen) days. (Patient not taking: Reported on 01/03/2023) 2 each 3   No current facility-administered medications for this visit.   Facility-Administered Medications Ordered in Other Visits  Medication Dose Route Frequency Provider Last Rate Last Admin   0.9 %  sodium chloride infusion   Intravenous Continuous Rickard Patience, MD 5 mL/hr at 03/13/23 1018 Infusion Verify at 03/13/23 1018   CARBOplatin (PARAPLATIN) 750 mg in sodium chloride 0.9 % 250 mL chemo infusion  750 mg Intravenous Once Rickard Patience, MD       PACLitaxel (TAXOL) 360 mg in sodium chloride 0.9 % 500 mL chemo infusion (> 80mg /m2)  175 mg/m2 (Treatment Plan Adjusted) Intravenous Once Rickard Patience, MD 187 mL/hr at 03/13/23 1038 360 mg at 03/13/23 1038    Review of Systems  Constitutional:  Negative for appetite change, chills, fatigue, fever and unexpected weight change.  HENT:   Negative for hearing loss and voice change.   Eyes:  Negative for eye problems.  Respiratory:  Negative for chest tightness, cough and shortness of breath.   Cardiovascular:  Negative for chest pain and leg swelling.  Gastrointestinal:  Negative for abdominal distention, abdominal pain and blood in stool.  Endocrine: Negative for hot flashes.  Genitourinary:  Negative for difficulty urinating and frequency.   Musculoskeletal:  Negative for arthralgias.       S/p left  BKA   Skin:  Negative for  itching and rash.  Neurological:  Positive for numbness. Negative for extremity weakness.  Hematological:  Negative for adenopathy.  Psychiatric/Behavioral:  Negative for confusion.      PHYSICAL EXAMINATION: ECOG PERFORMANCE STATUS: 1 - Symptomatic but completely ambulatory  Vitals:   03/13/23 0829  BP: (!) 158/71  Pulse: 80  Resp: 19  Temp: (!) 96.7 F (35.9 C)  SpO2: 99%   Filed Weights   03/13/23 0829  Weight: 265 lb 11.2 oz (120.5 kg)    Physical Exam Constitutional:      General: She is not in acute distress.    Appearance: She is obese. She is not diaphoretic.  HENT:     Head: Normocephalic and atraumatic.     Mouth/Throat:     Pharynx: No oropharyngeal exudate.  Eyes:     General: No scleral icterus. Cardiovascular:     Rate and Rhythm: Normal rate and regular rhythm.  Pulmonary:     Effort: Pulmonary effort is normal. No respiratory distress.  Breath sounds: Normal breath sounds. No wheezing.  Abdominal:     General: There is no distension.     Palpations: Abdomen is soft.     Tenderness: There is no abdominal tenderness.  Musculoskeletal:        General: Normal range of motion.     Cervical back: Normal range of motion and neck supple.     Comments: S/p left BKA  Skin:    General: Skin is warm and dry.     Findings: No erythema.  Neurological:     Mental Status: She is alert and oriented to person, place, and time. Mental status is at baseline.     Cranial Nerves: No cranial nerve deficit.     Motor: No abnormal muscle tone.  Psychiatric:        Mood and Affect: Affect normal.      LABORATORY DATA:  I have reviewed the data as listed    Latest Ref Rng & Units 03/13/2023    7:58 AM 01/24/2023    8:15 AM 01/03/2023    8:32 AM  CBC  WBC 4.0 - 10.5 K/uL 7.4  6.8  4.9   Hemoglobin 12.0 - 15.0 g/dL 16.1  09.6  9.8   Hematocrit 36.0 - 46.0 % 30.9  31.1  30.7   Platelets 150 - 400 K/uL 256  110  176       Latest Ref Rng & Units 03/13/2023     7:58 AM 01/24/2023    8:15 AM 01/03/2023    8:32 AM  CMP  Glucose 70 - 99 mg/dL 045  409  811   BUN 6 - 20 mg/dL 27  33  24   Creatinine 0.44 - 1.00 mg/dL 9.14  7.82  9.56   Sodium 135 - 145 mmol/L 136  137  136   Potassium 3.5 - 5.1 mmol/L 3.9  4.3  4.4   Chloride 98 - 111 mmol/L 102  102  100   CO2 22 - 32 mmol/L 24  25  27    Calcium 8.9 - 10.3 mg/dL 8.8  8.9  9.1   Total Protein 6.5 - 8.1 g/dL 6.9  6.8  7.5   Total Bilirubin 0.0 - 1.2 mg/dL 0.4  0.3  0.3   Alkaline Phos 38 - 126 U/L 45  49  41   AST 15 - 41 U/L 18  20  16    ALT 0 - 44 U/L 16  21  18       RADIOGRAPHIC STUDIES: I have personally reviewed the radiological images as listed and agreed with the findings in the report. No results found.

## 2023-03-13 NOTE — Patient Instructions (Signed)

## 2023-03-13 NOTE — Assessment & Plan Note (Signed)
 Lab Results  Component Value Date   HGB 10.0 (L) 03/13/2023   TIBC 340 01/03/2023   IRONPCTSAT 45 (H) 01/03/2023   FERRITIN 99 01/03/2023    continue oral iron supplementation. Stable Hb

## 2023-03-14 LAB — CA 125: Cancer Antigen (CA) 125: 7 U/mL (ref 0.0–38.1)

## 2023-03-21 ENCOUNTER — Encounter: Payer: Self-pay | Admitting: Oncology

## 2023-03-23 ENCOUNTER — Other Ambulatory Visit: Payer: Self-pay | Admitting: Family

## 2023-03-23 DIAGNOSIS — E782 Mixed hyperlipidemia: Secondary | ICD-10-CM

## 2023-03-28 ENCOUNTER — Ambulatory Visit: Payer: 59 | Admitting: Cardiovascular Disease

## 2023-03-30 DIAGNOSIS — S88112A Complete traumatic amputation at level between knee and ankle, left lower leg, initial encounter: Secondary | ICD-10-CM | POA: Diagnosis not present

## 2023-04-02 MED FILL — Fosaprepitant Dimeglumine For IV Infusion 150 MG (Base Eq): INTRAVENOUS | Qty: 5 | Status: AC

## 2023-04-03 ENCOUNTER — Inpatient Hospital Stay

## 2023-04-03 ENCOUNTER — Encounter: Payer: Self-pay | Admitting: Oncology

## 2023-04-03 ENCOUNTER — Inpatient Hospital Stay (HOSPITAL_BASED_OUTPATIENT_CLINIC_OR_DEPARTMENT_OTHER): Admitting: Oncology

## 2023-04-03 VITALS — BP 171/84 | HR 78 | Temp 97.1°F | Resp 18 | Wt 271.8 lb

## 2023-04-03 VITALS — BP 157/91 | HR 84 | Temp 97.0°F | Resp 18

## 2023-04-03 DIAGNOSIS — C563 Malignant neoplasm of bilateral ovaries: Secondary | ICD-10-CM | POA: Diagnosis not present

## 2023-04-03 DIAGNOSIS — Z7982 Long term (current) use of aspirin: Secondary | ICD-10-CM | POA: Diagnosis not present

## 2023-04-03 DIAGNOSIS — C569 Malignant neoplasm of unspecified ovary: Secondary | ICD-10-CM

## 2023-04-03 DIAGNOSIS — Z87891 Personal history of nicotine dependence: Secondary | ICD-10-CM | POA: Diagnosis not present

## 2023-04-03 DIAGNOSIS — D508 Other iron deficiency anemias: Secondary | ICD-10-CM

## 2023-04-03 DIAGNOSIS — Z5111 Encounter for antineoplastic chemotherapy: Secondary | ICD-10-CM

## 2023-04-03 DIAGNOSIS — R110 Nausea: Secondary | ICD-10-CM

## 2023-04-03 DIAGNOSIS — Z1509 Genetic susceptibility to other malignant neoplasm: Secondary | ICD-10-CM | POA: Diagnosis not present

## 2023-04-03 DIAGNOSIS — C786 Secondary malignant neoplasm of retroperitoneum and peritoneum: Secondary | ICD-10-CM | POA: Diagnosis not present

## 2023-04-03 DIAGNOSIS — T451X5A Adverse effect of antineoplastic and immunosuppressive drugs, initial encounter: Secondary | ICD-10-CM

## 2023-04-03 DIAGNOSIS — Z148 Genetic carrier of other disease: Secondary | ICD-10-CM | POA: Diagnosis not present

## 2023-04-03 DIAGNOSIS — Z7901 Long term (current) use of anticoagulants: Secondary | ICD-10-CM | POA: Diagnosis not present

## 2023-04-03 DIAGNOSIS — G629 Polyneuropathy, unspecified: Secondary | ICD-10-CM | POA: Diagnosis not present

## 2023-04-03 LAB — CMP (CANCER CENTER ONLY)
ALT: 24 U/L (ref 0–44)
AST: 19 U/L (ref 15–41)
Albumin: 3.7 g/dL (ref 3.5–5.0)
Alkaline Phosphatase: 47 U/L (ref 38–126)
Anion gap: 10 (ref 5–15)
BUN: 23 mg/dL — ABNORMAL HIGH (ref 6–20)
CO2: 25 mmol/L (ref 22–32)
Calcium: 9.3 mg/dL (ref 8.9–10.3)
Chloride: 102 mmol/L (ref 98–111)
Creatinine: 0.97 mg/dL (ref 0.44–1.00)
GFR, Estimated: >60 mL/min (ref >60)
Glucose, Bld: 165 mg/dL — ABNORMAL HIGH (ref 70–99)
Potassium: 4.1 mmol/L (ref 3.5–5.1)
Sodium: 137 mmol/L (ref 135–145)
Total Bilirubin: 0.4 mg/dL (ref 0.0–1.2)
Total Protein: 7 g/dL (ref 6.5–8.1)

## 2023-04-03 LAB — CBC WITH DIFFERENTIAL (CANCER CENTER ONLY)
Abs Immature Granulocytes: 0.05 10*3/uL (ref 0.00–0.07)
Basophils Absolute: 0.1 10*3/uL (ref 0.0–0.1)
Basophils Relative: 1 %
Eosinophils Absolute: 0.3 10*3/uL (ref 0.0–0.5)
Eosinophils Relative: 4 %
HCT: 31.3 % — ABNORMAL LOW (ref 36.0–46.0)
Hemoglobin: 10.3 g/dL — ABNORMAL LOW (ref 12.0–15.0)
Immature Granulocytes: 1 %
Lymphocytes Relative: 42 %
Lymphs Abs: 2.6 10*3/uL (ref 0.7–4.0)
MCH: 34.3 pg — ABNORMAL HIGH (ref 26.0–34.0)
MCHC: 32.9 g/dL (ref 30.0–36.0)
MCV: 104.3 fL — ABNORMAL HIGH (ref 80.0–100.0)
Monocytes Absolute: 0.5 10*3/uL (ref 0.1–1.0)
Monocytes Relative: 8 %
Neutro Abs: 2.7 10*3/uL (ref 1.7–7.7)
Neutrophils Relative %: 44 %
Platelet Count: 254 10*3/uL (ref 150–400)
RBC: 3 MIL/uL — ABNORMAL LOW (ref 3.87–5.11)
RDW: 15.2 % (ref 11.5–15.5)
WBC Count: 6.2 10*3/uL (ref 4.0–10.5)
nRBC: 0 % (ref 0.0–0.2)

## 2023-04-03 MED ORDER — DEXAMETHASONE SODIUM PHOSPHATE 10 MG/ML IJ SOLN
10.0000 mg | Freq: Once | INTRAMUSCULAR | Status: AC
  Administered 2023-04-03: 10 mg via INTRAVENOUS
  Filled 2023-04-03: qty 1

## 2023-04-03 MED ORDER — DIPHENHYDRAMINE HCL 50 MG/ML IJ SOLN
50.0000 mg | Freq: Once | INTRAMUSCULAR | Status: AC
  Administered 2023-04-03: 50 mg via INTRAVENOUS
  Filled 2023-04-03: qty 1

## 2023-04-03 MED ORDER — FAMOTIDINE IN NACL 20-0.9 MG/50ML-% IV SOLN
20.0000 mg | Freq: Once | INTRAVENOUS | Status: AC
  Administered 2023-04-03: 20 mg via INTRAVENOUS
  Filled 2023-04-03: qty 50

## 2023-04-03 MED ORDER — HEPARIN SOD (PORK) LOCK FLUSH 100 UNIT/ML IV SOLN
500.0000 [IU] | Freq: Once | INTRAVENOUS | Status: AC | PRN
Start: 2023-04-03 — End: 2023-04-03
  Administered 2023-04-03: 500 [IU]
  Filled 2023-04-03: qty 5

## 2023-04-03 MED ORDER — SODIUM CHLORIDE 0.9 % IV SOLN
750.0000 mg | Freq: Once | INTRAVENOUS | Status: AC
  Administered 2023-04-03: 750 mg via INTRAVENOUS
  Filled 2023-04-03: qty 75

## 2023-04-03 MED ORDER — SODIUM CHLORIDE 0.9 % IV SOLN
150.0000 mg | Freq: Once | INTRAVENOUS | Status: AC
  Administered 2023-04-03: 150 mg via INTRAVENOUS
  Filled 2023-04-03: qty 150

## 2023-04-03 MED ORDER — PALONOSETRON HCL INJECTION 0.25 MG/5ML
0.2500 mg | Freq: Once | INTRAVENOUS | Status: AC
  Administered 2023-04-03: 0.25 mg via INTRAVENOUS
  Filled 2023-04-03: qty 5

## 2023-04-03 MED ORDER — SODIUM CHLORIDE 0.9 % IV SOLN
INTRAVENOUS | Status: DC
Start: 2023-04-03 — End: 2023-04-03
  Filled 2023-04-03: qty 250

## 2023-04-03 MED ORDER — SODIUM CHLORIDE 0.9 % IV SOLN
175.0000 mg/m2 | Freq: Once | INTRAVENOUS | Status: AC
  Administered 2023-04-03: 360 mg via INTRAVENOUS
  Filled 2023-04-03: qty 60

## 2023-04-03 NOTE — Assessment & Plan Note (Signed)
 Lab Results  Component Value Date   HGB 10.3 (L) 04/03/2023   TIBC 367 03/13/2023   IRONPCTSAT 15 03/13/2023   FERRITIN 57 03/13/2023    continue oral iron supplementation. Stable Hb

## 2023-04-03 NOTE — Assessment & Plan Note (Signed)
Chemotherapy treatment as planned. 

## 2023-04-03 NOTE — Assessment & Plan Note (Signed)
 We discussed about instructions of antiemetics.  She can use Compazine as instructed if Zofran only partially relieves her symptoms.

## 2023-04-03 NOTE — Progress Notes (Signed)
 Hematology/Oncology Progress note [phone] [phone]      CHIEF COMPLAINTS  High grade serous carcinoma of ovaries.   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 Post treatment CT showed excellent response.  S/p Debulking R1 on 02/20/2023  Recommend additional 2-3 cycles of carboplatin taxol Consider add PARP inhibitor adjuvantly  Labs are reviewed and discussed with patient. Proceed with cycle 6 carboplatin and taxol.  Repeat CT  I will discuss with Gynonc and finalize her maintenance therapy regimen.  Consider  PARP inhibitor +/- Bevacizumab adjuvantly    Khorana score 3  on Eliquis 2.5mg  BID, continue Aspirin 81mg  daily.  Genetic evaluation - BRIP1 mutation  Chemotherapy-induced nausea We discussed about instructions of antiemetics.  She can use Compazine as instructed if Zofran only partially relieves her symptoms.   Encounter for antineoplastic chemotherapy Chemotherapy treatment as planned.   IDA (iron deficiency anemia) Lab Results  Component Value Date   HGB 10.3 (L) 04/03/2023   TIBC 367 03/13/2023   IRONPCTSAT 15 03/13/2023   FERRITIN 57 03/13/2023    continue oral iron supplementation. Stable Hb  Neuropathy Pre existing neuropathy after left lower extremity amputation.  Grade 1/ intermittent neuropathy of fingertips and toes.  Continue Gabapentin 300mg  PRN   Follow up TBD  All questions were answered. The patient knows to call the clinic with any problems, questions or concerns.  Rickard Patience, MD, PhD [facility] Hematology Oncology 04/03/2023    HISTORY OF PRESENTING ILLNESS:  Nancy [name] 58 y.o. female presents to establish care for  peritoneal carcinomatosis I have reviewed her chart and materials related to her cancer extensively  and collaborated history with the patient. 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 Aortic atherosclerosis    11/06/2022 Initial Diagnosis   Primary peritoneal carcinomatosis   -presented to emergency room for evaluation of abdominal pain/bloating, poor oral intake, nonbloody nonbilious vomiting  Patient underwent paracentesis, cytology showed malignant cells. IHC can not be added.    11/14/2022 Imaging   CT chest w contrast  1. Prominent subcentimeter right retrocrural lymph nodes, metastatic disease can not be excluded. 2. Nonspecific ground-glass nodule of the superior portion of the right lower lobe measuring 7 mm. Recommend attention on follow-up. 3. Small left pleural effusion. 4. Partially visualized large volume abdominal ascites and peritoneal thickening, volume of ascites appears increased when compared with the prior CT. 5. Coronary artery calcifications and aortic Atherosclerosis (ICD10-I70.0).   11/18/2022 Procedure   Medi port placement by IR   11/18/2022 Procedure   Omentum biopsy showed  1. Omentum, biopsy,  :   - INVOLVEMENT BY HIGH-GRADE SEROUS CARCINOMA OF GYNECOLOGIC ORIGIN.   Diagnosis Note : The carcinoma is positive for cytokeratin 7, PAX8, WT1, and ER.  Cytokeratin 20 and GATA3 are negative.  P53 is overexpressed.  The morphologic findings in conjunction with the pattern of immunohistochemistry support the  above diagnosis.   MyRIAD HRD positive    11/21/2022 -  Chemotherapy   Patient is on Treatment Plan : Carboplatin + Paclitaxel q21d      Genetic Testing  Pathogenic variant in BRIP1 called  p.S624* (c.1871C>A) identified on the [employer] CancerNext-Expanded+RNA  panel. The report date is 11/25/2022.  The CancerNext-Expanded + RNAinsight gene panel offered by [employer] and includes sequencing and rearrangement analysis for the following 71 genes: AIP, ALK, APC*, ATM*, AXIN2, BAP1, BARD1, BMPR1A, BRCA1*, BRCA2*, BRIP1*, CDC73, CDH1*,CDK4, CDKN1B, CDKN2A, CHEK2*, CTNNA1, DICER1, FH, FLCN, KIF1B, LZTR1, MAX, MEN1, MET, MLH1*, MSH2*, MSH3, MSH6*, MUTYH*, NF1*, NF2, NTHL1, PALB2*, PHOX2B, PMS2*, POT1, PRKAR1A, PTCH1, PTEN*, RAD51C*, RAD51D*,RB1, RET, SDHA, SDHAF2, SDHB, SDHC, SDHD, SMAD4, SMARCA4, SMARCB1, SMARCE1, STK11, SUFU, TMEM127, TP53*,TSC1, TSC2, VHL; EGFR, EGLN1, HOXB13, KIT, MITF, PDGFRA, POLD1 and POLE (sequencing only); EPCAM and GREM1 (deletion/duplication only).    01/13/2023 Imaging   CT chest abdomen pelvis w contrast showed  Decreasing ascites, trace residual. The areas of omental caking and nodularity along the perineum is decreasing as well with significant residual particularly in the upper anterior abdomen.   Stable small mesenteric and retroperitoneal nodes. Resolved left pleural effusion. No developing new mass lesion. Small left renal angiomyolipoma and upper pole nonobstructing renal stone.   02/20/2023 Surgery    Interval debulking R1 - robotic assisted laparoscopic total hysterectomy, bilateral salpingo-oophorectomy, lysis of adhesions, peritoneal stripping, and omentectomy with stage IIIC high grade serous carcinoma of bilateral ovaries   A. Uterus, bilateral fallopian tubes, and bilateral ovaries, total hysterectomy and bilateral salpingo-oophorectomy: Residual high grade serous carcinoma (1.6 cm) involving bilateral ovaries, left fallopian tube, and uterine serosa.   Additional findings:  Uterus with leiomyomata (up to 1.1 cm) Right fallopian tube with no pathologic diagnosis See synoptic report.   B. Peritoneal nodules, excision: High grade serous carcinoma (up to 0.1 cm).  C. Omentum, omentectomy:  High grade serous  carcinoma (0.7 cm). Moderate treatment response (CRS2).     03/04/2023 Cancer Staging   Staging form: Ovary, AJCC 7th Edition - Pathologic stage from 03/04/2023: FIGO Stage IIIC (cM0) - Signed by Rickard Patience, MD on 03/13/2023 Stage prefix: Initial diagnosis Laterality: Bilateral   History of sepsis secondary to diabetic foot infection/osteomyelitis status post left BKA done on 09/24/2022,    Today she presents to for evaluation prior to chemotherapy. Accompanied by significant other.  + nausea, manageable with antiemetics.  No other new complaints. S/p debulking surgery on 02/20/2023.  Denies any abdominal pain, vomiting, diarrhea. Appetite is good.  Intermittent fingertip and toe numbness, she take gabapentin PRN    MEDICAL HISTORY:  Past Medical History:  Diagnosis Date   Abnormal cardiovascular stress test 09/21/2018   Formatting of this note might be different from the original. Lexiscan Myoview 09/16/2018 revealed mild anterior ischemia   Adverse effect of motion 05/10/2014   AKI (acute kidney injury) (HCC) 10/22/2022   Anxiety    Arthritis    r knee   Asthma    Breast cyst 05/10/2014   CAD (coronary artery disease)    a.) LHC 06/04/2009 at [facility]; non-obstructive CAD. b.) CTA with FFR 10/08/2018: extensive mixed plaque proximal to mid LAD (51-69%); Coronary Ca score 217; FFR 0.71 dPDA, 0.86 mLAD, 0.87 dLCx.   CCF (congestive cardiac failure) (HCC) 06/06/2008   a.) 30% EF. b.) TTE 06/03/2011: EF >55%; triv MR. c.) TTE 04/27/2018: EF 55%; mild LVH; triv PR, mild MR/TR; G1DD.   Cellulitis of left lower extremity 09/18/2022   Chest pain with high risk for cardiac etiology 07/02/2016   Chronic use of opiate drug for therapeutic purpose 02/13/2022   Formatting of this note might be different from the original. [facility]  Contract signed on 04/18/16 & updated 03/04/17; UDS done on 04/18/16.   Complication of anesthesia    Diabetic foot infection (HCC) 03/13/2015   Diabetic foot ulcer (HCC)  02/08/2021   Diabetic ulcer of left heel associated with type 2 diabetes mellitus (HCC) 02/08/2021   Eczema    Family history of adverse reaction to anesthesia    a.) PONV in mother and grandmother   Gas gangrene (HCC) 09/18/2022   GERD (gastroesophageal reflux disease) 05/10/2014   History of kidney stones    History of MSSA bacteremia due to osteomyelitis L foot 09/18/22 10/22/2022   HLD (hyperlipidemia)    Hyponatremia 03/13/2015   Migraines    Motion sickness    MSSA bacteremia 09/20/2022   Osteomyelitis (HCC) 09/18/2022   Other acute osteomyelitis, left ankle and foot (HCC) 02/13/2022   Panic attacks    Peripheral edema 04/27/2018   Pneumonia    PONV (postoperative nausea and vomiting)    Sepsis secondary to diabetic foot infection 03/13/2015   Sepsis secondary to diabetic foot infection 03/13/2015   T2DM (type 2 diabetes mellitus) (HCC)    Takotsubo cardiomyopathy / transient apical balooning syndrome / stress-induced cardiomyopathy 05/10/2014   Unspecified essential hypertension     SURGICAL HISTORY: Past Surgical History:  Procedure Laterality Date   AMPUTATION Left 09/19/2022   Procedure: AMPUTATION BELOW KNEE;  Surgeon: Renford Dills, MD;  Location: [facility];  Service: Vascular;  Laterality: Left;   AMPUTATION Left 09/24/2022   Procedure: AMPUTATION BELOW KNEE WITH WOUND CLOSURE;  Surgeon: Renford Dills, MD;  Location: [facility];  Service: Vascular;  Laterality: Left;   AMPUTATION TOE Left 03/16/2015   Procedure: left fifth toe amputation with incision and drainage;  Surgeon: Gwyneth Revels, DPM;  Location: [facility];  Service: Podiatry;  Laterality: Left;   APPLICATION OF WOUND VAC Left 02/14/2021   Procedure: APPLICATION OF WOUND VAC;  Surgeon: Gwyneth Revels, DPM;  Location: [facility];  Service: Podiatry;  Laterality: Left;   APPLICATION OF WOUND VAC Left 04/16/2021   Procedure: APPLICATION OF WOUND VAC;  Surgeon: Peggye Form, DO;  Location: [facility];  Service: Plastics;  Laterality: Left;   CARDIAC CATHETERIZATION Left 06/04/2009   Procedure: CARDIAC CATHETERIZATION; Location: [facility]   CHOLECYSTECTOMY     DEBRIDEMENT AND CLOSURE WOUND Left 04/16/2021   Procedure: DEBRIDEMENT AND CLOSURE WOUND;  Surgeon: Peggye Form, DO;  Location: [facility];  Service: Plastics;  Laterality: Left;  1 hour   IR IMAGING GUIDED PORT INSERTION  11/18/2022   IRRIGATION AND DEBRIDEMENT FOOT Left 02/08/2021   Procedure: IRRIGATION AND DEBRIDEMENT FOOT - LFT HEEL ULCER;  Surgeon: Gwyneth Revels, DPM;  Location: [facility];  Service: Podiatry;  Laterality: Left;   IRRIGATION AND DEBRIDEMENT FOOT Left 02/14/2021   Procedure: IRRIGATION AND DEBRIDEMENT LEFT HEEL;  Surgeon: Gwyneth Revels, DPM;  Location: [facility];  Service: Podiatry;  Laterality: Left;   kidney stone removal     KNEE ARTHROSCOPY W/ MENISCAL REPAIR     NOSE SURGERY  01/07/1989   due to fracture   TEE WITHOUT CARDIOVERSION N/A 09/26/2022   Procedure: TRANSESOPHAGEAL ECHOCARDIOGRAM;  Surgeon: Clotilde Dieter, DO;  Location: [facility];  Service: Cardiovascular;  Laterality: N/A;   TOE AMPUTATION     second toe   TONSILLECTOMY     TOTAL KNEE ARTHROPLASTY  01/11/2011   Procedure: TOTAL KNEE ARTHROPLASTY;  Surgeon: Harvie Junior;  Location: [facility];  Service: Orthopedics;  Laterality:  Right;  COMPUTER ASSISTED TOTAL KNEE REPLACEMENT    SOCIAL HISTORY: Social History   Socioeconomic History   Marital status: Married    Spouse name: Not on file   Number of children: Not on file   Years of education: Not on file   Highest education level: Not on file  Occupational History   Not on file  Tobacco Use   Smoking status: Former    Current packs/day: 0.00    Types: Cigarettes    Quit date: 03/12/1985    Years since quitting: 38.0   Smokeless tobacco: Never  Vaping Use   Vaping status: Never Used  Substance and Sexual Activity   Alcohol use: No    Drug use: No   Sexual activity: Yes    Birth control/protection: Post-menopausal  Other Topics Concern   Not on file  Social History Narrative   Wife, [name], at bedside. One indoor pet, dog.   Social Drivers of Corporate investment banker Strain: Not on file  Food Insecurity: No Food Insecurity (10/22/2022)   Hunger Vital Sign    Worried About Running Out of Food in the Last Year: Never true    Ran Out of Food in the Last Year: Never true  Transportation Needs: No Transportation Needs (10/22/2022)   PRAPARE - Administrator, Civil Service (Medical): No    Lack of Transportation (Non-Medical): No  Physical Activity: Not on file  Stress: Not on file  Social Connections: Not on file  Intimate Partner Violence: Not At Risk (10/22/2022)   Humiliation, Afraid, Rape, and Kick questionnaire    Fear of Current or Ex-Partner: No    Emotionally Abused: No    Physically Abused: No    Sexually Abused: No    FAMILY HISTORY: Family History  Problem Relation Age of Onset   Transient ischemic attack Mother    Asthma Mother    Hypertension Mother    Lung cancer Father 89   Hypertension Father    Asthma Sister    Anxiety disorder Sister    Asthma Sister    Hypertension Brother    Ovarian cancer Maternal Aunt 34   Melanoma Maternal Uncle        dx 10s   Lung cancer Paternal Aunt 62   Stomach cancer Maternal Grandmother    Colon cancer Maternal Grandmother    Lymphoma Maternal Grandmother    Melanoma Maternal Grandfather 72       sun exposure   Lung cancer Paternal Grandmother 17   Kidney cancer Paternal Grandfather        dx early   Crohn's disease Other    GER disease Other    Breast cancer Neg Hx     ALLERGIES:  is allergic to shellfish allergy, codeine, morphine and codeine, rosuvastatin, percocet [oxycodone-acetaminophen], sulfa antibiotics, and theophyllines.  MEDICATIONS:  Current Outpatient Medications  Medication Sig Dispense Refill   acidophilus  (RISAQUAD) CAPS capsule Take 1 capsule by mouth daily.     ALPRAZolam (XANAX) 0.5 MG tablet TAKE 1/2 TO 1 TABLET BY MOUTH AS NEEDED FOR PANIC ATTACKS 30 tablet 2   ascorbic acid (VITAMIN C) 500 MG tablet Take 1 tablet (500 mg total) by mouth 2 (two) times daily. 60 tablet 0   aspirin EC 81 MG tablet Take 81 mg by mouth daily.     carvedilol (COREG) 6.25 MG tablet TAKE 1 TABLET BY MOUTH TWICE A DAY 180 tablet 1   cholecalciferol (VITAMIN D3) 25 MCG (1000  UNIT) tablet Take 1,000 Units by mouth daily.     Continuous Glucose Sensor (FREESTYLE LIBRE 2 SENSOR) MISC 1 each by Does not apply route every 14 (fourteen) days. 2 each 3   Cyanocobalamin (VITAMIN B 12) 500 MCG TABS Take by mouth daily at 8 pm.     desvenlafaxine (PRISTIQ) 100 MG 24 hr tablet TAKE 1 TABLET BY MOUTH EVERY DAY 90 tablet 2   dexamethasone (DECADRON) 4 MG tablet Take 2 tablets (8mg ) by mouth daily starting the day after carboplatin for 2 days. Take with food 30 tablet 1   dicyclomine (BENTYL) 10 MG capsule Take 1 capsule (10 mg total) by mouth 4 (four) times daily -  before meals and at bedtime. 60 capsule 1   Dulaglutide (TRULICITY) 1.5 MG/0.5ML SOAJ Inject 1.5 mg into the skin once a week. 2 mL 2   ELIQUIS 2.5 MG TABS tablet TAKE 1 TABLET BY MOUTH TWICE A DAY 60 tablet 2   Ensure Max Protein (ENSURE MAX PROTEIN) LIQD Take 330 mLs (11 oz total) by mouth 2 (two) times daily.     fenofibrate 54 MG tablet TAKE 1 TABLET BY MOUTH EVERY DAY 30 tablet 11   gabapentin (NEURONTIN) 300 MG capsule Take 1 capsule (300 mg total) by mouth at bedtime as needed. 30 capsule 3   insulin NPH-regular Human (70-30) 100 UNIT/ML injection Inject 15 Units into the skin in the morning and at bedtime.     lisinopril (ZESTRIL) 10 MG tablet Take 1 tablet (10 mg total) by mouth every morning AND 0.5 tablets (5 mg total) at bedtime. 45 tablet 11   metFORMIN (GLUCOPHAGE) 500 MG tablet TAKE TWO TABLETS EACH MORNING. AND 3 TABLETS IN THE EVENING AS DIRECTED 150  tablet 5   methocarbamol (ROBAXIN) 500 MG tablet Take 1 tablet (500 mg total) by mouth every 8 (eight) hours as needed for muscle spasms. 30 tablet 0   mirtazapine (REMERON) 7.5 MG tablet TAKE 1 TABLET BY MOUTH AT BEDTIME. 90 tablet 2   montelukast (SINGULAIR) 10 MG tablet TAKE 1 TABLET BY MOUTH EVERYDAY AT BEDTIME 30 tablet 5   Multiple Vitamin (MULTIVITAMIN WITH MINERALS) TABS tablet Take 1 tablet by mouth daily. 30 tablet 2   ondansetron (ZOFRAN) 8 MG tablet Take 1 tablet (8 mg total) by mouth every 8 (eight) hours as needed for nausea or vomiting. Start on the third day after carboplatin. 30 tablet 1   ondansetron (ZOFRAN-ODT) 8 MG disintegrating tablet Take 1 tablet (8 mg total) by mouth every 8 (eight) hours as needed for nausea or vomiting. 90 tablet 3   pantoprazole (PROTONIX) 40 MG tablet TAKE 1 TABLET BY MOUTH EVERY DAY 90 tablet 3   pravastatin (PRAVACHOL) 40 MG tablet TAKE 1 TABLET BY MOUTH EVERY DAY 30 tablet 5   Ubrogepant (UBRELVY) 100 MG TABS Take 1 tablet (100 mg total) by mouth as needed (migraine). 12 tablet 1   zinc sulfate 220 (50 Zn) MG capsule Take 1 capsule (220 mg total) by mouth daily. 30 capsule 0   albuterol (PROVENTIL HFA;VENTOLIN HFA) 108 (90 Base) MCG/ACT inhaler Inhale 2 puffs into the lungs every 4 (four) hours as needed for wheezing or shortness of breath. Reported on 03/29/2015 (Patient not taking: Reported on 04/03/2023) 1 Inhaler 3   albuterol (PROVENTIL) (2.5 MG/3ML) 0.083% nebulizer solution Take 2.5 mg by nebulization as needed. (Patient not taking: Reported on 04/03/2023)     fluconazole (DIFLUCAN) 200 MG tablet TAKE 1 TABLET BY MOUTH EVERY DAY (  Patient not taking: Reported on 04/03/2023) 30 tablet 3   HYDROmorphone (DILAUDID) 2 MG tablet Take 1 tablet (2 mg total) by mouth every 4 (four) hours as needed for severe pain (pain score 7-10). (Patient not taking: Reported on 04/03/2023) 60 tablet 0   ibuprofen (ADVIL) 800 MG tablet TAKE 1 TABLET BY MOUTH EVERY 8 HOURS  AS NEEDED (Patient not taking: Reported on 04/03/2023) 90 tablet 3   iron polysaccharides (NIFEREX) 150 MG capsule Take 1 capsule (150 mg total) by mouth daily. (Patient not taking: Reported on 04/03/2023) 30 capsule 2   lidocaine-prilocaine (EMLA) cream Apply to affected area once (Patient not taking: Reported on 04/03/2023) 30 g 3   naloxone (NARCAN) nasal spray 4 mg/0.1 mL SPRAY 1 SPRAY INTO ONE NOSTRIL AS DIRECTED FOR OPIOID OVERDOSE (TURN PERSON ON SIDE AFTER DOSE. IF NO RESPONSE IN 2-3 MINUTES OR PERSON RESPONDS BUT RELAPSES, REPEAT USING A NEW SPRAY DEVICE AND SPRAY INTO THE OTHER NOSTRIL. CALL 911 AFTER USE.) * EMERGENCY USE ONLY * (Patient not taking: Reported on 04/03/2023) 1 each 0   nystatin (MYCOSTATIN/NYSTOP) powder APPLY TOPICALLY TWICE DAILY AS NEEDED (Patient not taking: Reported on 04/03/2023) 30 g 3   nystatin ointment (MYCOSTATIN) Apply 1 Application topically 2 (two) times daily. (Patient not taking: Reported on 04/03/2023) 30 g 0   prochlorperazine (COMPAZINE) 10 MG tablet Take 1 tablet (10 mg total) by mouth every 6 (six) hours as needed for nausea or vomiting. (Patient not taking: Reported on 04/03/2023) 30 tablet 1   No current facility-administered medications for this visit.   Facility-Administered Medications Ordered in Other Visits  Medication Dose Route Frequency Provider Last Rate Last Admin   0.9 %  sodium chloride infusion   Intravenous Continuous Rickard Patience, MD 10 mL/hr at 04/03/23 0925 New Bag at 04/03/23 0925   CARBOplatin (PARAPLATIN) 750 mg in sodium chloride 0.9 % 250 mL chemo infusion  750 mg Intravenous Once Rickard Patience, MD       dexamethasone (DECADRON) injection 10 mg  10 mg Intravenous Once Rickard Patience, MD       diphenhydrAMINE (BENADRYL) injection 50 mg  50 mg Intravenous Once Rickard Patience, MD       famotidine (PEPCID) IVPB 20 mg premix  20 mg Intravenous Once Rickard Patience, MD       fosaprepitant (EMEND) 150 mg in sodium chloride 0.9 % 145 mL IVPB  150 mg Intravenous Once Rickard Patience, MD 450 mL/hr at 04/03/23 0926 150 mg at 04/03/23 0926   heparin lock flush 100 unit/mL  500 Units Intracatheter Once PRN Rickard Patience, MD       PACLitaxel (TAXOL) 360 mg in sodium chloride 0.9 % 500 mL chemo infusion (> 80mg /m2)  175 mg/m2 (Treatment Plan Adjusted) Intravenous Once Rickard Patience, MD       palonosetron (ALOXI) injection 0.25 mg  0.25 mg Intravenous Once Rickard Patience, MD        Review of Systems  Constitutional:  Negative for appetite change, chills, fatigue, fever and unexpected weight change.  HENT:   Negative for hearing loss and voice change.   Eyes:  Negative for eye problems.  Respiratory:  Negative for chest tightness, cough and shortness of breath.   Cardiovascular:  Negative for chest pain and leg swelling.  Gastrointestinal:  Negative for abdominal distention, abdominal pain and blood in stool.  Endocrine: Negative for hot flashes.  Genitourinary:  Negative for difficulty urinating and frequency.   Musculoskeletal:  Negative for arthralgias.  S/p left  BKA   Skin:  Negative for itching and rash.  Neurological:  Positive for numbness. Negative for extremity weakness.  Hematological:  Negative for adenopathy.  Psychiatric/Behavioral:  Negative for confusion.      PHYSICAL EXAMINATION: ECOG PERFORMANCE STATUS: 1 - Symptomatic but completely ambulatory  Vitals:   04/03/23 0846 04/03/23 0847  BP: (!) 171/89 (!) 171/84  Pulse:  78  Resp: 18   Temp: (!) 97.1 F (36.2 C)    Filed Weights   04/03/23 0846  Weight: 271 lb 12.8 oz (123.3 kg)    Physical Exam Constitutional:      General: She is not in acute distress.    Appearance: She is obese. She is not diaphoretic.  HENT:     Head: Normocephalic and atraumatic.     Mouth/Throat:     Pharynx: No oropharyngeal exudate.  Eyes:     General: No scleral icterus. Cardiovascular:     Rate and Rhythm: Normal rate and regular rhythm.  Pulmonary:     Effort: Pulmonary effort is normal. No respiratory distress.      Breath sounds: Normal breath sounds. No wheezing.  Abdominal:     General: There is no distension.     Palpations: Abdomen is soft.     Tenderness: There is no abdominal tenderness.  Musculoskeletal:        General: Normal range of motion.     Cervical back: Normal range of motion and neck supple.     Comments: S/p left BKA  Skin:    General: Skin is warm and dry.     Findings: No erythema.  Neurological:     Mental Status: She is alert and oriented to person, place, and time. Mental status is at baseline.     Cranial Nerves: No cranial nerve deficit.     Motor: No abnormal muscle tone.  Psychiatric:        Mood and Affect: Affect normal.      LABORATORY DATA:  I have reviewed the data as listed    Latest Ref Rng & Units 04/03/2023    7:56 AM 03/13/2023    7:58 AM 01/24/2023    8:15 AM  CBC  WBC 4.0 - 10.5 K/uL 6.2  7.4  6.8   Hemoglobin 12.0 - 15.0 g/dL 08.6  57.8  46.9   Hematocrit 36.0 - 46.0 % 31.3  30.9  31.1   Platelets 150 - 400 K/uL 254  256  110       Latest Ref Rng & Units 04/03/2023    7:56 AM 03/13/2023    7:58 AM 01/24/2023    8:15 AM  CMP  Glucose 70 - 99 mg/dL 629  528  413   BUN 6 - 20 mg/dL 23  27  33   Creatinine 0.44 - 1.00 mg/dL 2.44  0.10  2.72   Sodium 135 - 145 mmol/L 137  136  137   Potassium 3.5 - 5.1 mmol/L 4.1  3.9  4.3   Chloride 98 - 111 mmol/L 102  102  102   CO2 22 - 32 mmol/L 25  24  25    Calcium 8.9 - 10.3 mg/dL 9.3  8.8  8.9   Total Protein 6.5 - 8.1 g/dL 7.0  6.9  6.8   Total Bilirubin 0.0 - 1.2 mg/dL 0.4  0.4  0.3   Alkaline Phos 38 - 126 U/L 47  45  49   AST 15 - 41 U/L 19  18  20   ALT 0 - 44 U/L 24  16  21       RADIOGRAPHIC STUDIES: I have personally reviewed the radiological images as listed and agreed with the findings in the report. No results found.

## 2023-04-03 NOTE — Progress Notes (Signed)
 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  S/p debulking surgery on 2/13.  Patient receiving adjuvant chemotherapy.  Met with patient during infusion.  Reports that she hopes this is her last chemotherapy treatment.  Reports feeling good and good appetite.  "I gained weight because of eating too many jelly beans."  Reports overall good appetite.  No reported nutrition impact symptoms    Medications: reviewed  Labs: glucose 165  Anthropometrics:   Weight 271 lb 12. 8 oz  265 lb 11.2 oz on 3/6 255 lb 8 oz on 01/24/23 248 lb on 12/6 269 lb 3.2 oz on 11/4 259 lb on 11/5   NUTRITION DIAGNOSIS: Inadequate oral intake improved   INTERVENTION:  Encouraged lean proteins and plant foods in diet especially now that appetite has improved Patient to contact RD if needed in the future.   NEXT VISIT: no follow-up at this time RD available if needed  Kiersten Coss B. Elease Hashimoto, CSO, LDN Registered Dietitian [phone]

## 2023-04-03 NOTE — Patient Instructions (Signed)

## 2023-04-03 NOTE — Patient Instructions (Signed)
 [facility] - A DEPT OF [facility]  Discharge Instructions: Thank you for choosing [facility] to provide your oncology and hematology care.  If you have a lab appointment with the Cancer Center, please go directly to the Cancer Center and check in at the registration area.  Wear comfortable clothing and clothing appropriate for easy access to any Portacath or PICC line.   We strive to give you quality time with your provider. You may need to reschedule your appointment if you arrive late (15 or more minutes).  Arriving late affects you and other patients whose appointments are after yours.  Also, if you miss three or more appointments without notifying the office, you may be dismissed from the clinic at the provider's discretion.      For prescription refill requests, have your pharmacy contact our office and allow 72 hours for refills to be completed.    Today you received the following chemotherapy and/or immunotherapy agents Taxol and Carboplatin       To help prevent nausea and vomiting after your treatment, we encourage you to take your nausea medication as directed.  BELOW ARE SYMPTOMS THAT SHOULD BE REPORTED IMMEDIATELY: *FEVER GREATER THAN 100.4 F (38 C) OR HIGHER *CHILLS OR SWEATING *NAUSEA AND VOMITING THAT IS NOT CONTROLLED WITH YOUR NAUSEA MEDICATION *UNUSUAL SHORTNESS OF BREATH *UNUSUAL BRUISING OR BLEEDING *URINARY PROBLEMS (pain or burning when urinating, or frequent urination) *BOWEL PROBLEMS (unusual diarrhea, constipation, pain near the anus) TENDERNESS IN MOUTH AND THROAT WITH OR WITHOUT PRESENCE OF ULCERS (sore throat, sores in mouth, or a toothache) UNUSUAL RASH, SWELLING OR PAIN  UNUSUAL VAGINAL DISCHARGE OR ITCHING   Items with * indicate a potential emergency and should be followed up as soon as possible or go to the Emergency Department if any problems should occur.  Please show the CHEMOTHERAPY ALERT CARD or  IMMUNOTHERAPY ALERT CARD at check-in to the Emergency Department and triage nurse.  Should you have questions after your visit or need to cancel or reschedule your appointment, please contact [facility] - A DEPT OF [facility]  [phone] and follow the prompts.  Office hours are 8:00 a.m. to 4:30 p.m. Monday - Friday. Please note that voicemails left after 4:00 p.m. may not be returned until the following business day.  We are closed weekends and major holidays. You have access to a nurse at all times for urgent questions. Please call the main number to the clinic [phone] and follow the prompts.  For any non-urgent questions, you may also contact your provider using MyChart. We now offer e-Visits for anyone 72 and older to request care online for non-urgent symptoms. For details visit [email or web address].   Also download the MyChart app! Go to the app store, search "MyChart", open the app, select [place], and log in with your MyChart username and password.

## 2023-04-03 NOTE — Assessment & Plan Note (Signed)
 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  Grade 1/ intermittent neuropathy of fingertips and toes.  Continue Gabapentin 300mg  PRN

## 2023-04-03 NOTE — Assessment & Plan Note (Addendum)
 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 Post treatment CT showed excellent response.  S/p Debulking R1 on 02/20/2023  Recommend additional 2-3 cycles of carboplatin taxol Consider add PARP inhibitor adjuvantly  Labs are reviewed and discussed with patient. Proceed with cycle 6 carboplatin and taxol.  Repeat CT  I will discuss with Gynonc and finalize her maintenance therapy regimen.  Consider  PARP inhibitor +/- Bevacizumab adjuvantly    Khorana score 3  on Eliquis 2.5mg  BID, continue Aspirin 81mg  daily.  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

## 2023-04-04 LAB — CA 125: Cancer Antigen (CA) 125: 7.3 U/mL (ref 0.0–38.1)

## 2023-04-09 ENCOUNTER — Other Ambulatory Visit: Payer: Self-pay | Admitting: Oncology

## 2023-04-09 ENCOUNTER — Inpatient Hospital Stay: Attending: Oncology | Admitting: Obstetrics and Gynecology

## 2023-04-09 VITALS — BP 158/72 | HR 82 | Temp 97.6°F | Resp 20 | Wt 271.1 lb

## 2023-04-09 DIAGNOSIS — Z1509 Genetic susceptibility to other malignant neoplasm: Secondary | ICD-10-CM | POA: Insufficient documentation

## 2023-04-09 DIAGNOSIS — C569 Malignant neoplasm of unspecified ovary: Secondary | ICD-10-CM

## 2023-04-09 DIAGNOSIS — Z90722 Acquired absence of ovaries, bilateral: Secondary | ICD-10-CM | POA: Insufficient documentation

## 2023-04-09 DIAGNOSIS — Z148 Genetic carrier of other disease: Secondary | ICD-10-CM | POA: Diagnosis not present

## 2023-04-09 DIAGNOSIS — E119 Type 2 diabetes mellitus without complications: Secondary | ICD-10-CM | POA: Diagnosis not present

## 2023-04-09 DIAGNOSIS — Z90710 Acquired absence of both cervix and uterus: Secondary | ICD-10-CM | POA: Diagnosis not present

## 2023-04-09 DIAGNOSIS — C563 Malignant neoplasm of bilateral ovaries: Secondary | ICD-10-CM | POA: Insufficient documentation

## 2023-04-09 DIAGNOSIS — G620 Drug-induced polyneuropathy: Secondary | ICD-10-CM | POA: Diagnosis not present

## 2023-04-09 DIAGNOSIS — Z9079 Acquired absence of other genital organ(s): Secondary | ICD-10-CM | POA: Diagnosis not present

## 2023-04-09 NOTE — Progress Notes (Signed)
 Gynecologic Oncology follow up Visit   Referring Provider:  Dr. 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 G0 female with high grade serous ovarian cancer  Returns today for follow up.  She has completed 6 cycles of chemotherapy and is scheduled for a CT scan later this week.  She has some mild CIPN in fingers and toes.  CA125 has normalized to 7.3.   Gynecologic Oncology History:  Presented from Dr. 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 "Nancy [name] is a 58 y.o. female who presented with abdominal pain with CT showing large volume ascites and omental caking concerning for peritoneal carcinomatosis.  Patient has past medical history significant for type 2 diabetes, CAD, asthma, chronic pain, diabetic foot infection, GERD, hyperlipidemia, Takotsubo cardiomyopathy hospitalized from 9/11 to 09/30/2022 with sepsis secondary to diabetic foot infection/osteomyelitis s/p left BKA 9/17, also found to have Staph aureus bacteremia with TEE negative for vegetations, discharged home with 2 weeks of IV cefazolin, completed a few days ago, who presents to the ED with a 6-day history of nonbloody nonbilious vomiting Associated with generalized abdominal pain and distention.  States during her recent past hospitalization she initially presented with bleeding from her rectum but it spontaneously resolved.  She had abdominal pain and nausea but she attributed it to her foot infection.  She denies cough, chest pain, shortness of breath, leg pain, black or bloody stool except for one-time episode prior to her last hospitalization and denies, dysuria.  Hospital Course: Patient presents with abdominal pain/distention, found to have malignant ascites. Received a second therapeutic paracentesis on 10/21. Oncology following, suspects gyn primary. Final path report adenocarcinoma, but insufficient material  for IHC studies. Mild aki resolved and patient tolerating a diet. PT evaluated and deemed safe for discharge."  10/26/22 CA125 =762, CEA and CA19-9 normal  Maternal aunt with ovarian cancer.  No breast cancer in the family.   Patient completed 4 cycles of neoadjuvant carboplatin and taxol (175 mg/m2) for presumed mullerian primary cancer.   CA 125 10/26/22 762  11/21/22- 944 12/13/22-  332 01/03/23-  53.2 01/24/23- 14.3  01/13/2023- CT C/A/P w/ contrast - decreasing ascites, trace. Omental caking and nodularity along perineum decreasing as well with significant residual particularly in the upper anterior abdomen.  - stable small mesenteric and retroperitoneal nodes - resolved pleural effusion - small left renal angiomyolipoma and upper pole nonobstructing renal stone.   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 The report date is 11/25/2022.   The CancerNext-Expanded + RNAinsight gene panel offered by [employer] and includes sequencing and rearrangement analysis for the following 71 genes: AIP, ALK, APC*, ATM*, AXIN2, BAP1, BARD1, BMPR1A, BRCA1*, BRCA2*, BRIP1*, CDC73, CDH1*,CDK4, CDKN1B, CDKN2A, CHEK2*, CTNNA1, DICER1, FH, FLCN, KIF1B, LZTR1, MAX, MEN1, MET, MLH1*, MSH2*, MSH3, MSH6*, MUTYH*, NF1*, NF2, NTHL1, PALB2*, PHOX2B, PMS2*, POT1, PRKAR1A, PTCH1, PTEN*, RAD51C*, RAD51D*,RB1, RET, SDHA, SDHAF2, SDHB, SDHC, SDHD, SMAD4, SMARCA4, SMARCB1, SMARCE1, STK11, SUFU, TMEM127, TP53*,TSC1, TSC2, VHL; EGFR, EGLN1, HOXB13, KIT, MITF, PDGFRA, POLD1 and POLE (sequencing only); EPCAM and GREM1 (deletion/duplication only).   Myriad HRD positive. BRCA1/BRCA2 negative.   02/20/23 she underwent R1 robotic interval debulking at [facility] with residual miliary disease 3mm. A. Uterus, bilateral fallopian tubes, and bilateral ovaries, total hysterectomy and bilateral salpingo-oophorectomy: Residual high grade serous  carcinoma (1.6 cm) involving bilateral ovaries, left fallopian tube, and uterine  serosa.   Additional findings:  Uterus with leiomyomata (up to 1.1 cm) Right fallopian tube with no pathologic diagnosis   B. Peritoneal nodules, excision: High grade serous carcinoma (up to 0.1 cm).   C. Omentum, omentectomy:  High grade serous carcinoma (0.7 cm). Moderate treatment response (CRS2).   Mammogram normal 2021, no prior colonoscopy. Had an episode of rectal bleeding, but none now.  Problem List: Patient Active Problem List   Diagnosis Date Noted   Chemotherapy-induced neuropathy (HCC) 03/13/2023   Neuropathy 03/13/2023   Generalized anxiety disorder 02/28/2023   Blurring of vision 12/13/2022   Chemotherapy-induced nausea 12/13/2022   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   Encounter for antineoplastic chemotherapy 11/21/2022   Neoplasm related pain 11/21/2022   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   Protein calorie malnutrition (HCC) 11/06/2022   IDA (iron deficiency anemia) 11/06/2022   Normocytic anemia 10/28/2022   Malignant ascites 10/23/2022   Hypotension 10/22/2022   Intractable vomiting 10/22/2022   S/P left  BKA 09/24/22 secondary to osteomyelitis (below knee amputation) (HCC) 10/22/2022   Diarrhea 10/22/2022   Adjustment disorder with mixed anxiety and depressed mood 09/26/2022   B12 deficiency due to diet 05/16/2022   Vitamin D deficiency, unspecified 05/16/2022   Atherosclerosis of native arteries of the extremities with ulceration (HCC) 01/30/2021   Diabetes mellitus, type 2 (HCC) 03/13/2015   Allergic rhinitis 05/10/2014   Asthma without status asthmaticus 05/10/2014   Atherosclerosis of native coronary artery of native heart with angina pectoris (HCC) 05/10/2014   Clinical depression 05/10/2014   Neuropathy, diabetic (HCC) 05/10/2014   Acid reflux 05/10/2014   Mixed hyperlipidemia 05/10/2014   Migraine without aura  and without status migrainosus, not intractable 05/10/2014   Takotsubo cardiomyopathy 05/10/2014   Obesity, Class III, BMI 40-49.9 (morbid obesity) (HCC) 05/10/2014   Essential hypertension 08/09/2008   Heart failure (HCC) 06/06/2008    Past Medical History: Past Medical History:  Diagnosis Date   Abnormal cardiovascular stress test 09/21/2018   Formatting of this note might be different from the original. Lexiscan Myoview 09/16/2018 revealed mild anterior ischemia   Adverse effect of motion 05/10/2014   AKI (acute kidney injury) (HCC) 10/22/2022   Anxiety    Arthritis    r knee   Asthma    Breast cyst 05/10/2014   CAD (coronary artery disease)    a.) LHC 06/04/2009 at [facility]; non-obstructive CAD. b.) CTA with FFR 10/08/2018: extensive mixed plaque proximal to mid LAD (51-69%); Coronary Ca score 217; FFR 0.71 dPDA, 0.86 mLAD, 0.87 dLCx.   CCF (congestive cardiac failure) (HCC) 06/06/2008   a.) 30% EF. b.) TTE 06/03/2011: EF >55%; triv MR. c.) TTE 04/27/2018: EF 55%; mild LVH; triv PR, mild MR/TR; G1DD.   Cellulitis of left lower extremity 09/18/2022   Chest pain with high risk for cardiac etiology 07/02/2016   Chronic use of opiate drug for therapeutic purpose 02/13/2022   Formatting of this note might be different from the original. [facility] Pain Contract signed on 04/18/16 & updated 03/04/17; UDS done on 04/18/16.   Complication of anesthesia    Diabetic foot infection (HCC) 03/13/2015   Diabetic foot ulcer (HCC) 02/08/2021   Diabetic ulcer of left heel associated with type 2 diabetes mellitus (HCC) 02/08/2021   Eczema    Family history of adverse reaction to anesthesia    a.) PONV in mother and grandmother   Gas gangrene (HCC) 09/18/2022   GERD (gastroesophageal reflux disease) 05/10/2014  History of kidney stones    History of MSSA bacteremia due to osteomyelitis L foot 09/18/22 10/22/2022   HLD (hyperlipidemia)    Hyponatremia 03/13/2015   Migraines    Motion sickness    MSSA  bacteremia 09/20/2022   Osteomyelitis (HCC) 09/18/2022   Other acute osteomyelitis, left ankle and foot (HCC) 02/13/2022   Panic attacks    Peripheral edema 04/27/2018   Pneumonia    PONV (postoperative nausea and vomiting)    Sepsis secondary to diabetic foot infection 03/13/2015   Sepsis secondary to diabetic foot infection 03/13/2015   T2DM (type 2 diabetes mellitus) (HCC)    Takotsubo cardiomyopathy / transient apical balooning syndrome / stress-induced cardiomyopathy 05/10/2014   Unspecified essential hypertension     Past Surgical History: Past Surgical History:  Procedure Laterality Date   AMPUTATION Left 09/19/2022   Procedure: AMPUTATION BELOW KNEE;  Surgeon: Renford Dills, MD;  Location: [facility];  Service: Vascular;  Laterality: Left;   AMPUTATION Left 09/24/2022   Procedure: AMPUTATION BELOW KNEE WITH WOUND CLOSURE;  Surgeon: Renford Dills, MD;  Location: [facility];  Service: Vascular;  Laterality: Left;   AMPUTATION TOE Left 03/16/2015   Procedure: left fifth toe amputation with incision and drainage;  Surgeon: Gwyneth Revels, DPM;  Location: [facility];  Service: Podiatry;  Laterality: Left;   APPLICATION OF WOUND VAC Left 02/14/2021   Procedure: APPLICATION OF WOUND VAC;  Surgeon: Gwyneth Revels, DPM;  Location: [facility];  Service: Podiatry;  Laterality: Left;   APPLICATION OF WOUND VAC Left 04/16/2021   Procedure: APPLICATION OF WOUND VAC;  Surgeon: Peggye Form, DO;  Location: [facility];  Service: Plastics;  Laterality: Left;   CARDIAC CATHETERIZATION Left 06/04/2009   Procedure: CARDIAC CATHETERIZATION; Location: [facility]   CHOLECYSTECTOMY     DEBRIDEMENT AND CLOSURE WOUND Left 04/16/2021   Procedure: DEBRIDEMENT AND CLOSURE WOUND;  Surgeon: Peggye Form, DO;  Location: [facility];  Service: Plastics;  Laterality: Left;  1 hour   IR IMAGING GUIDED PORT INSERTION  11/18/2022   IRRIGATION AND DEBRIDEMENT  FOOT Left 02/08/2021   Procedure: IRRIGATION AND DEBRIDEMENT FOOT - LFT HEEL ULCER;  Surgeon: Gwyneth Revels, DPM;  Location: [facility];  Service: Podiatry;  Laterality: Left;   IRRIGATION AND DEBRIDEMENT FOOT Left 02/14/2021   Procedure: IRRIGATION AND DEBRIDEMENT LEFT HEEL;  Surgeon: Gwyneth Revels, DPM;  Location: [facility];  Service: Podiatry;  Laterality: Left;   kidney stone removal     KNEE ARTHROSCOPY W/ MENISCAL REPAIR     NOSE SURGERY  01/07/1989   due to fracture   TEE WITHOUT CARDIOVERSION N/A 09/26/2022   Procedure: TRANSESOPHAGEAL ECHOCARDIOGRAM;  Surgeon: Clotilde Dieter, DO;  Location: [facility];  Service: Cardiovascular;  Laterality: N/A;   TOE AMPUTATION     second toe   TONSILLECTOMY     TOTAL KNEE ARTHROPLASTY  01/11/2011   Procedure: TOTAL KNEE ARTHROPLASTY;  Surgeon: Harvie Junior;  Location: [facility];  Service: Orthopedics;  Laterality: Right;  COMPUTER ASSISTED TOTAL KNEE REPLACEMENT       OB History:  OB History  Gravida Para Term Preterm AB Living  0 0 0 0 0 0  SAB IAB Ectopic Multiple Live Births  0 0 0 0 0    Family History: Family History  Problem Relation Age of Onset   Transient ischemic attack Mother    Asthma Mother    Hypertension Mother    Lung cancer Father 72  Hypertension Father    Asthma Sister    Anxiety disorder Sister    Asthma Sister    Hypertension Brother    Ovarian cancer Maternal Aunt 59   Melanoma Maternal Uncle        dx 103s   Lung cancer Paternal Aunt 10   Stomach cancer Maternal Grandmother    Colon cancer Maternal Grandmother    Lymphoma Maternal Grandmother    Melanoma Maternal Grandfather 72       sun exposure   Lung cancer Paternal Grandmother 25   Kidney cancer Paternal Grandfather        dx early   Crohn's disease Other    GER disease Other    Breast cancer Neg Hx     Social History: Social History   Socioeconomic History   Marital status: Married    Spouse name: Not on file   Number of children: Not on file    Years of education: Not on file   Highest education level: Not on file  Occupational History   Not on file  Tobacco Use   Smoking status: Former    Current packs/day: 0.00    Types: Cigarettes    Quit date: 03/12/1985    Years since quitting: 38.1   Smokeless tobacco: Never  Vaping Use   Vaping status: Never Used  Substance and Sexual Activity   Alcohol use: No   Drug use: No   Sexual activity: Yes    Birth control/protection: Post-menopausal  Other Topics Concern   Not on file  Social History Narrative   Wife, [name], at bedside. One indoor pet, dog.   Social Drivers of Corporate investment banker Strain: Not on file  Food Insecurity: No Food Insecurity (10/22/2022)   Hunger Vital Sign    Worried About Running Out of Food in the Last Year: Never true    Ran Out of Food in the Last Year: Never true  Transportation Needs: No Transportation Needs (10/22/2022)   PRAPARE - Administrator, Civil Service (Medical): No    Lack of Transportation (Non-Medical): No  Physical Activity: Not on file  Stress: Not on file  Social Connections: Not on file  Intimate Partner Violence: Not At Risk (10/22/2022)   Humiliation, Afraid, Rape, and Kick questionnaire    Fear of Current or Ex-Partner: No    Emotionally Abused: No    Physically Abused: No    Sexually Abused: No    Allergies: Allergies  Allergen Reactions   Shellfish Allergy Anaphylaxis   Codeine Other (See Comments)    Migraine  Other Reaction(s): Other (See Comments)  Reaction:  Severe migraines   Morphine And Codeine Other (See Comments)    Reaction:  Severe migraines    Rosuvastatin     Other reaction(s): Muscle Pain   Percocet [Oxycodone-Acetaminophen] Nausea And Vomiting   Sulfa Antibiotics Rash   Theophyllines Itching and Rash    Current Medications: Current Outpatient Medications  Medication Sig Dispense Refill   acidophilus (RISAQUAD) CAPS capsule Take 1 capsule by mouth daily.      albuterol (PROVENTIL HFA;VENTOLIN HFA) 108 (90 Base) MCG/ACT inhaler Inhale 2 puffs into the lungs every 4 (four) hours as needed for wheezing or shortness of breath. Reported on 03/29/2015 (Patient not taking: Reported on 04/03/2023) 1 Inhaler 3   albuterol (PROVENTIL) (2.5 MG/3ML) 0.083% nebulizer solution Take 2.5 mg by nebulization as needed. (Patient not taking: Reported on 04/03/2023)     ALPRAZolam (XANAX) 0.5 MG tablet  TAKE 1/2 TO 1 TABLET BY MOUTH AS NEEDED FOR PANIC ATTACKS 30 tablet 2   ascorbic acid (VITAMIN C) 500 MG tablet Take 1 tablet (500 mg total) by mouth 2 (two) times daily. 60 tablet 0   aspirin EC 81 MG tablet Take 81 mg by mouth daily.     carvedilol (COREG) 6.25 MG tablet TAKE 1 TABLET BY MOUTH TWICE A DAY 180 tablet 1   cholecalciferol (VITAMIN D3) 25 MCG (1000 UNIT) tablet Take 1,000 Units by mouth daily.     Continuous Glucose Sensor (FREESTYLE LIBRE 2 SENSOR) MISC 1 each by Does not apply route every 14 (fourteen) days. 2 each 3   Cyanocobalamin (VITAMIN B 12) 500 MCG TABS Take by mouth daily at 8 pm.     desvenlafaxine (PRISTIQ) 100 MG 24 hr tablet TAKE 1 TABLET BY MOUTH EVERY DAY 90 tablet 2   dexamethasone (DECADRON) 4 MG tablet Take 2 tablets (8mg ) by mouth daily starting the day after carboplatin for 2 days. Take with food 30 tablet 1   dicyclomine (BENTYL) 10 MG capsule Take 1 capsule (10 mg total) by mouth 4 (four) times daily -  before meals and at bedtime. 60 capsule 1   Dulaglutide (TRULICITY) 1.5 MG/0.5ML SOAJ Inject 1.5 mg into the skin once a week. 2 mL 2   ELIQUIS 2.5 MG TABS tablet TAKE 1 TABLET BY MOUTH TWICE A DAY 60 tablet 2   Ensure Max Protein (ENSURE MAX PROTEIN) LIQD Take 330 mLs (11 oz total) by mouth 2 (two) times daily.     fenofibrate 54 MG tablet TAKE 1 TABLET BY MOUTH EVERY DAY 30 tablet 11   fluconazole (DIFLUCAN) 200 MG tablet TAKE 1 TABLET BY MOUTH EVERY DAY (Patient not taking: Reported on 04/03/2023) 30 tablet 3   gabapentin (NEURONTIN) 300 MG  capsule Take 1 capsule (300 mg total) by mouth at bedtime as needed. 30 capsule 3   HYDROmorphone (DILAUDID) 2 MG tablet Take 1 tablet (2 mg total) by mouth every 4 (four) hours as needed for severe pain (pain score 7-10). (Patient not taking: Reported on 04/03/2023) 60 tablet 0   ibuprofen (ADVIL) 800 MG tablet TAKE 1 TABLET BY MOUTH EVERY 8 HOURS AS NEEDED (Patient not taking: Reported on 04/03/2023) 90 tablet 3   insulin NPH-regular Human (70-30) 100 UNIT/ML injection Inject 15 Units into the skin in the morning and at bedtime.     iron polysaccharides (NIFEREX) 150 MG capsule Take 1 capsule (150 mg total) by mouth daily. (Patient not taking: Reported on 04/03/2023) 30 capsule 2   lidocaine-prilocaine (EMLA) cream Apply to affected area once (Patient not taking: Reported on 04/03/2023) 30 g 3   lisinopril (ZESTRIL) 10 MG tablet Take 1 tablet (10 mg total) by mouth every morning AND 0.5 tablets (5 mg total) at bedtime. 45 tablet 11   metFORMIN (GLUCOPHAGE) 500 MG tablet TAKE TWO TABLETS EACH MORNING. AND 3 TABLETS IN THE EVENING AS DIRECTED 150 tablet 5   methocarbamol (ROBAXIN) 500 MG tablet Take 1 tablet (500 mg total) by mouth every 8 (eight) hours as needed for muscle spasms. 30 tablet 0   mirtazapine (REMERON) 7.5 MG tablet TAKE 1 TABLET BY MOUTH AT BEDTIME. 90 tablet 2   montelukast (SINGULAIR) 10 MG tablet TAKE 1 TABLET BY MOUTH EVERYDAY AT BEDTIME 30 tablet 5   Multiple Vitamin (MULTIVITAMIN WITH MINERALS) TABS tablet Take 1 tablet by mouth daily. 30 tablet 2   naloxone (NARCAN) nasal spray 4 mg/0.1 mL  SPRAY 1 SPRAY INTO ONE NOSTRIL AS DIRECTED FOR OPIOID OVERDOSE (TURN PERSON ON SIDE AFTER DOSE. IF NO RESPONSE IN 2-3 MINUTES OR PERSON RESPONDS BUT RELAPSES, REPEAT USING A NEW SPRAY DEVICE AND SPRAY INTO THE OTHER NOSTRIL. CALL 911 AFTER USE.) * EMERGENCY USE ONLY * (Patient not taking: Reported on 04/03/2023) 1 each 0   nystatin (MYCOSTATIN/NYSTOP) powder APPLY TOPICALLY TWICE DAILY AS NEEDED  (Patient not taking: Reported on 04/03/2023) 30 g 3   nystatin ointment (MYCOSTATIN) Apply 1 Application topically 2 (two) times daily. (Patient not taking: Reported on 04/03/2023) 30 g 0   ondansetron (ZOFRAN) 8 MG tablet Take 1 tablet (8 mg total) by mouth every 8 (eight) hours as needed for nausea or vomiting. Start on the third day after carboplatin. 30 tablet 1   ondansetron (ZOFRAN-ODT) 8 MG disintegrating tablet Take 1 tablet (8 mg total) by mouth every 8 (eight) hours as needed for nausea or vomiting. 90 tablet 3   pantoprazole (PROTONIX) 40 MG tablet TAKE 1 TABLET BY MOUTH EVERY DAY 90 tablet 3   pravastatin (PRAVACHOL) 40 MG tablet TAKE 1 TABLET BY MOUTH EVERY DAY 30 tablet 5   prochlorperazine (COMPAZINE) 10 MG tablet Take 1 tablet (10 mg total) by mouth every 6 (six) hours as needed for nausea or vomiting. (Patient not taking: Reported on 04/03/2023) 30 tablet 1   Ubrogepant (UBRELVY) 100 MG TABS Take 1 tablet (100 mg total) by mouth as needed (migraine). 12 tablet 1   zinc sulfate 220 (50 Zn) MG capsule Take 1 capsule (220 mg total) by mouth daily. 30 capsule 0   No current facility-administered medications for this visit.    Review of Systems General: no complaints  HEENT: no complaints  Lungs: no complaints  Cardiac: no complaints  GI: no complaints  GU: no complaints  Musculoskeletal: no complaints  Extremities: no complaints  Skin: no complaints  Neuro: no complaints  Endocrine: no complaints  Psych: no complaints       Objective:  Physical Examination:  Vitals:   04/09/23 1439  BP: (!) 158/72  Pulse: 82  Resp: 20  Temp: 97.6 F (36.4 C)  SpO2: 99%      ECOG Performance Status: 1 - Symptomatic but completely ambulatory  GENERAL: Patient is in no acute distress HEENT:  PERRL, neck supple with midline trachea.  NODES:  No cervical, supraclavicular, axillary, or inguinal lymphadenopathy palpated.  LUNGS:  Clear to auscultation bilaterally.  No wheezes or  rhonchi. HEART:  Regular rate and rhythm.  ABDOMEN:  Soft and protuberant. Mild tenderness midline to RUQ. Well healed RUQ scar. Nondistended, No ascites or masses. Large pannus. No hernia.   EXTREMITIES:  No peripheral edema.   SKIN:  Clear with no obvious rashes or skin changes.  NEURO:  Nonfocal. Well oriented.  Appropriate affect.  Pelvic: deferred  Lab Review   Chemistry      Component Value Date/Time   NA 137 04/03/2023 0756   NA 132 (L) 08/07/2022 1210   NA 133 (L) 08/21/2012 1523   K 4.1 04/03/2023 0756   K 4.1 08/21/2012 1523   CL 102 04/03/2023 0756   CL 102 08/21/2012 1523   CO2 25 04/03/2023 0756   CO2 26 08/21/2012 1523   BUN 23 (H) 04/03/2023 0756   BUN 23 08/07/2022 1210   BUN 17 08/21/2012 1523   CREATININE 0.97 04/03/2023 0756   CREATININE 0.69 08/21/2012 1523   GLU 231 02/07/2012 0000      Component  Value Date/Time   CALCIUM 9.3 04/03/2023 0756   CALCIUM 8.8 08/21/2012 1523   ALKPHOS 47 04/03/2023 0756   ALKPHOS 73 08/21/2012 1523   AST 19 04/03/2023 0756   ALT 24 04/03/2023 0756   ALT 25 08/21/2012 1523   BILITOT 0.4 04/03/2023 0756      Lab Results  Component Value Date   WBC 6.2 04/03/2023   HGB 10.3 (L) 04/03/2023   HCT 31.3 (L) 04/03/2023   MCV 104.3 (H) 04/03/2023   PLT 254 04/03/2023    Radiologic Imaging: CT scan 10/22/22  FINDINGS: Lower chest: 6 no acute abnormality   Hepatobiliary: No focal liver abnormality is seen. Status post cholecystectomy. No biliary dilatation.   Pancreas: No focal abnormality or ductal dilatation.   Spleen: No focal abnormality.  Normal size.   Adrenals/Urinary Tract: No adrenal abnormality. No focal renal abnormality. No stones or hydronephrosis. Urinary bladder is unremarkable.   Stomach/Bowel: Stomach, large and small bowel grossly unremarkable.   Vascular/Lymphatic: Aortic atherosclerosis. No evidence of aneurysm or adenopathy.   Reproductive: Uterus and adnexa unremarkable.  No mass.    Other: Large volume ascites in the abdomen and pelvis. Omental thickening/caking noted anteriorly. Appearance is concerning for peritoneal carcinomatosis, often seen with ovarian cancer. However, no ovarian mass visualized.   Musculoskeletal: No acute bony abnormality.   IMPRESSION: Large volume ascites in the abdomen or pelvis with omental caking. Appearance is concerning for peritoneal malignancy/carcinomatosis, often seen with ovarian cancer, but no ovarian mass visualized.   Aortic atherosclerosis.   01/13/2023 Narrative & Impression  CLINICAL DATA:  Peritoneal carcinomatosis.  * Tracking Code: BO *   EXAM: CT CHEST, ABDOMEN, AND PELVIS WITH CONTRAST   TECHNIQUE: Multidetector CT imaging of the chest, abdomen and pelvis was performed following the standard protocol during bolus administration of intravenous contrast.   RADIATION DOSE REDUCTION: This exam was performed according to the departmental dose-optimization program which includes automated exposure control, adjustment of the mA and/or kV according to patient size and/or use of iterative reconstruction technique.   CONTRAST:  OMNIPAQUE IOHEXOL 300 MG/ML  SOLN   COMPARISON:  Chest CT 11/14/2022.  Abdomen pelvis CT 10/22/2022.   FINDINGS: CT CHEST FINDINGS   Cardiovascular: Right upper chest port is accessed. Tip of the catheter extends into the right atrium. Coronary artery calcifications are seen. The heart is nonenlarged. Calcifications along the mitral valve annulus. Thoracic aorta has a normal course and caliber with some mild calcified plaque scattered.   Mediastinum/Nodes: Preserved thyroid gland. Slightly patulous thoracic esophagus. No specific abnormal lymph node enlargement identified in the axillary regions, hilum or mediastinum.   Lungs/Pleura: Calcified right apical lung nodule identified on series 4, image 27 consistent with old granulomatous disease and is unchanged. No new dominant  lung nodule identified. No consolidation, pneumothorax or effusion. Additional punctate calcification in the left lung series 4, image 98. Previous pleural effusion on the left is no longer identified. The small ground-glass nodule in the posterior right lower lobe is stable in size on image 77 but much more faint.   Musculoskeletal: There is some curvature of the spine with some degenerative changes.   CT ABDOMEN PELVIS FINDINGS   Hepatobiliary: No focal liver abnormality is seen. Status post cholecystectomy. No biliary dilatation. Patent portal vein.   Pancreas: Unremarkable. No pancreatic ductal dilatation or surrounding inflammatory changes.   Spleen: Normal in size without focal abnormality.   Adrenals/Urinary Tract: The adrenal glands are preserved. Punctate upper pole nonobstructing left-sided renal stone.  There is small area of macroscopic fat as well in the posterior left mid kidney consistent with a angiomyolipoma measuring 16 mm on series 2, image 68. No separate enhancing mass. There are some tiny low-attenuation lesions elsewhere as well which are too small to completely characterize. Bosniak 2 lesions. Ureters have normal course and caliber down to the bladder. Preserved contours of the urinary bladder.   Stomach/Bowel: Stomach and small bowel are nondilated on this non oral contrast examination large bowel as well has a normal course and caliber with diffuse colonic stool.   Vascular/Lymphatic: Normal caliber aorta and IVC. Scattered vascular calcifications. There are several small less than 1 cm size nodes identified along the retroperitoneum, nonpathologic by size criteria. More numerous than usually seen but similar in appearance of the previous examination.   Reproductive: Uterus and bilateral adnexa are unremarkable.   Other: Decreasing ascites and peritoneal nodularity with significant areas of residual nodularity. Greatest along the extreme  anterior abdomen such as series 2 image 79. The level of the central mesenteric stranding is similar but the ascites is less.   Musculoskeletal: Curvature of the spine with moderate degenerative changes.   IMPRESSION: Decreasing ascites, trace residual. The areas of omental caking and nodularity along the perineum is decreasing as well with significant residual particularly in the upper anterior abdomen.   Stable small mesenteric and retroperitoneal nodes.   Resolved left pleural effusion.   No developing new mass lesion.   Small left renal angiomyolipoma and upper pole nonobstructing renal stone.          Assessment:  Nancy [name] is a 58 y.o. 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 CA125 elevated at 762, and CEA and CA19-9 normal. 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 Excellent response to carboplatin/taxol x 4 cycles with CA125 17 1/25. On 02/20/23 she underwent R1 robotic interval debulking with hysterectomy, BSO and debulking at [facility] with residual miliary disease 3mm.  She then received 2 additional cycles of chemotherapy and CA125 normalized to 7.3.   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 Discussed with Dr Cathie Hoops in medical oncology and she is in agreement.   Continue to optimize her diabetic management. Her doctor may place her back on Mounjaro.   Discussed that hereditary genetic panel testing results and her family is getting cascade testing. Her mother had  the BRIP gene mutation.   We can see her back in 3 months.   The patient's diagnosis, an outline of the further diagnostic and laboratory studies which will be required, the recommendation, and alternatives were discussed.  All questions were answered to the patient's satisfaction.   Leida Lauth, MD

## 2023-04-10 ENCOUNTER — Encounter: Payer: Self-pay | Admitting: Family

## 2023-04-10 ENCOUNTER — Ambulatory Visit (INDEPENDENT_AMBULATORY_CARE_PROVIDER_SITE_OTHER): Payer: 59 | Admitting: Family

## 2023-04-10 VITALS — BP 142/82 | HR 82 | Ht 69.0 in | Wt 265.4 lb

## 2023-04-10 DIAGNOSIS — E782 Mixed hyperlipidemia: Secondary | ICD-10-CM | POA: Diagnosis not present

## 2023-04-10 DIAGNOSIS — E559 Vitamin D deficiency, unspecified: Secondary | ICD-10-CM | POA: Diagnosis not present

## 2023-04-10 DIAGNOSIS — I10 Essential (primary) hypertension: Secondary | ICD-10-CM | POA: Diagnosis not present

## 2023-04-10 DIAGNOSIS — E1165 Type 2 diabetes mellitus with hyperglycemia: Secondary | ICD-10-CM

## 2023-04-10 DIAGNOSIS — Z89512 Acquired absence of left leg below knee: Secondary | ICD-10-CM | POA: Diagnosis not present

## 2023-04-10 DIAGNOSIS — E538 Deficiency of other specified B group vitamins: Secondary | ICD-10-CM

## 2023-04-10 DIAGNOSIS — Z794 Long term (current) use of insulin: Secondary | ICD-10-CM | POA: Diagnosis not present

## 2023-04-10 DIAGNOSIS — E66813 Obesity, class 3: Secondary | ICD-10-CM | POA: Diagnosis not present

## 2023-04-11 ENCOUNTER — Ambulatory Visit
Admission: RE | Admit: 2023-04-11 | Discharge: 2023-04-11 | Disposition: A | Discharge status: Home / Self Care | Source: Ambulatory Visit | Attending: Oncology | Admitting: Oncology

## 2023-04-11 DIAGNOSIS — C786 Secondary malignant neoplasm of retroperitoneum and peritoneum: Secondary | ICD-10-CM | POA: Diagnosis not present

## 2023-04-11 DIAGNOSIS — I700 Atherosclerosis of aorta: Secondary | ICD-10-CM | POA: Diagnosis not present

## 2023-04-11 DIAGNOSIS — C569 Malignant neoplasm of unspecified ovary: Secondary | ICD-10-CM | POA: Insufficient documentation

## 2023-04-11 MED ORDER — IOHEXOL 300 MG/ML  SOLN
100.0000 mL | Freq: Once | INTRAMUSCULAR | Status: AC | PRN
  Administered 2023-04-11: 100 mL via INTRAVENOUS

## 2023-04-23 MED FILL — Fosaprepitant Dimeglumine For IV Infusion 150 MG (Base Eq): INTRAVENOUS | Qty: 5 | Status: AC

## 2023-04-24 ENCOUNTER — Inpatient Hospital Stay

## 2023-04-24 ENCOUNTER — Encounter: Payer: Self-pay | Admitting: Oncology

## 2023-04-24 ENCOUNTER — Inpatient Hospital Stay (HOSPITAL_BASED_OUTPATIENT_CLINIC_OR_DEPARTMENT_OTHER): Admitting: Oncology

## 2023-04-24 VITALS — BP 155/79 | HR 84 | Resp 18

## 2023-04-24 VITALS — BP 142/83 | HR 83 | Temp 98.8°F | Resp 19 | Ht 69.0 in | Wt 275.8 lb

## 2023-04-24 DIAGNOSIS — Z90722 Acquired absence of ovaries, bilateral: Secondary | ICD-10-CM | POA: Diagnosis not present

## 2023-04-24 DIAGNOSIS — Z9079 Acquired absence of other genital organ(s): Secondary | ICD-10-CM | POA: Diagnosis not present

## 2023-04-24 DIAGNOSIS — R110 Nausea: Secondary | ICD-10-CM | POA: Diagnosis not present

## 2023-04-24 DIAGNOSIS — C569 Malignant neoplasm of unspecified ovary: Secondary | ICD-10-CM

## 2023-04-24 DIAGNOSIS — Z148 Genetic carrier of other disease: Secondary | ICD-10-CM | POA: Diagnosis not present

## 2023-04-24 DIAGNOSIS — Z5111 Encounter for antineoplastic chemotherapy: Secondary | ICD-10-CM

## 2023-04-24 DIAGNOSIS — G629 Polyneuropathy, unspecified: Secondary | ICD-10-CM

## 2023-04-24 DIAGNOSIS — G620 Drug-induced polyneuropathy: Secondary | ICD-10-CM | POA: Diagnosis not present

## 2023-04-24 DIAGNOSIS — Z1509 Genetic susceptibility to other malignant neoplasm: Secondary | ICD-10-CM | POA: Diagnosis not present

## 2023-04-24 DIAGNOSIS — C563 Malignant neoplasm of bilateral ovaries: Secondary | ICD-10-CM | POA: Diagnosis not present

## 2023-04-24 DIAGNOSIS — Z90710 Acquired absence of both cervix and uterus: Secondary | ICD-10-CM | POA: Diagnosis not present

## 2023-04-24 DIAGNOSIS — T451X5A Adverse effect of antineoplastic and immunosuppressive drugs, initial encounter: Secondary | ICD-10-CM | POA: Diagnosis not present

## 2023-04-24 DIAGNOSIS — E119 Type 2 diabetes mellitus without complications: Secondary | ICD-10-CM | POA: Diagnosis not present

## 2023-04-24 LAB — CBC WITH DIFFERENTIAL (CANCER CENTER ONLY)
Abs Immature Granulocytes: 0.04 10*3/uL (ref 0.00–0.07)
Basophils Absolute: 0.1 10*3/uL (ref 0.0–0.1)
Basophils Relative: 1 %
Eosinophils Absolute: 0.1 10*3/uL (ref 0.0–0.5)
Eosinophils Relative: 1 %
HCT: 31 % — ABNORMAL LOW (ref 36.0–46.0)
Hemoglobin: 10.2 g/dL — ABNORMAL LOW (ref 12.0–15.0)
Immature Granulocytes: 1 %
Lymphocytes Relative: 47 %
Lymphs Abs: 2.6 10*3/uL (ref 0.7–4.0)
MCH: 34.7 pg — ABNORMAL HIGH (ref 26.0–34.0)
MCHC: 32.9 g/dL (ref 30.0–36.0)
MCV: 105.4 fL — ABNORMAL HIGH (ref 80.0–100.0)
Monocytes Absolute: 0.5 10*3/uL (ref 0.1–1.0)
Monocytes Relative: 10 %
Neutro Abs: 2.2 10*3/uL (ref 1.7–7.7)
Neutrophils Relative %: 40 %
Platelet Count: 160 10*3/uL (ref 150–400)
RBC: 2.94 MIL/uL — ABNORMAL LOW (ref 3.87–5.11)
RDW: 14.3 % (ref 11.5–15.5)
WBC Count: 5.5 10*3/uL (ref 4.0–10.5)
nRBC: 0 % (ref 0.0–0.2)

## 2023-04-24 LAB — CMP (CANCER CENTER ONLY)
ALT: 23 U/L (ref 0–44)
AST: 23 U/L (ref 15–41)
Albumin: 3.8 g/dL (ref 3.5–5.0)
Alkaline Phosphatase: 49 U/L (ref 38–126)
Anion gap: 11 (ref 5–15)
BUN: 28 mg/dL — ABNORMAL HIGH (ref 6–20)
CO2: 24 mmol/L (ref 22–32)
Calcium: 8.9 mg/dL (ref 8.9–10.3)
Chloride: 103 mmol/L (ref 98–111)
Creatinine: 1.01 mg/dL — ABNORMAL HIGH (ref 0.44–1.00)
GFR, Estimated: >60 mL/min (ref >60)
Glucose, Bld: 170 mg/dL — ABNORMAL HIGH (ref 70–99)
Potassium: 3.8 mmol/L (ref 3.5–5.1)
Sodium: 138 mmol/L (ref 135–145)
Total Bilirubin: 0.4 mg/dL (ref 0.0–1.2)
Total Protein: 6.9 g/dL (ref 6.5–8.1)

## 2023-04-24 MED ORDER — SODIUM CHLORIDE 0.9 % IV SOLN
175.0000 mg/m2 | Freq: Once | INTRAVENOUS | Status: AC
Start: 2023-04-24 — End: 2023-04-24
  Administered 2023-04-24: 360 mg via INTRAVENOUS
  Filled 2023-04-24: qty 60

## 2023-04-24 MED ORDER — PALONOSETRON HCL INJECTION 0.25 MG/5ML
0.2500 mg | Freq: Once | INTRAVENOUS | Status: AC
  Administered 2023-04-24: 0.25 mg via INTRAVENOUS
  Filled 2023-04-24: qty 5

## 2023-04-24 MED ORDER — HEPARIN SOD (PORK) LOCK FLUSH 100 UNIT/ML IV SOLN
500.0000 [IU] | Freq: Once | INTRAVENOUS | Status: AC | PRN
Start: 2023-04-24 — End: 2023-04-24
  Administered 2023-04-24: 500 [IU]
  Filled 2023-04-24: qty 5

## 2023-04-24 MED ORDER — SODIUM CHLORIDE 0.9 % IV SOLN
750.0000 mg | Freq: Once | INTRAVENOUS | Status: AC
  Administered 2023-04-24: 750 mg via INTRAVENOUS
  Filled 2023-04-24: qty 75

## 2023-04-24 MED ORDER — SODIUM CHLORIDE 0.9 % IV SOLN
INTRAVENOUS | Status: DC
  Filled 2023-04-24: qty 250

## 2023-04-24 MED ORDER — FAMOTIDINE IN NACL 20-0.9 MG/50ML-% IV SOLN
20.0000 mg | Freq: Once | INTRAVENOUS | Status: AC
  Administered 2023-04-24: 20 mg via INTRAVENOUS
  Filled 2023-04-24: qty 50

## 2023-04-24 MED ORDER — DEXAMETHASONE SODIUM PHOSPHATE 10 MG/ML IJ SOLN
10.0000 mg | Freq: Once | INTRAMUSCULAR | Status: AC
  Administered 2023-04-24: 10 mg via INTRAVENOUS
  Filled 2023-04-24: qty 1

## 2023-04-24 MED ORDER — SODIUM CHLORIDE 0.9 % IV SOLN
150.0000 mg | Freq: Once | INTRAVENOUS | Status: AC
  Administered 2023-04-24: 150 mg via INTRAVENOUS
  Filled 2023-04-24: qty 150

## 2023-04-24 MED ORDER — DIPHENHYDRAMINE HCL 50 MG/ML IJ SOLN
50.0000 mg | Freq: Once | INTRAMUSCULAR | Status: AC
  Administered 2023-04-24: 50 mg via INTRAVENOUS
  Filled 2023-04-24: qty 1

## 2023-04-24 NOTE — Progress Notes (Signed)
 Hematology/Oncology Progress note [phone] [phone]      CHIEF COMPLAINTS  High grade serous carcinoma of ovaries.   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 Post treatment CT showed excellent response. S/p Debulking R1 on 02/20/2023, s/p 2 cycles of adjuvant carboplatin taxol Repeat CT results were reviewed with patient- minimal residual peritoneal thickening.  Discussed case with Gynonc Dr. Johnnette Litter, recommend additional 2 cycles followed by Olaparib maintenance.  Labs are reviewed and discussed with patient. Proceed with carboplatin and Taxol  Khorana score 3  on Eliquis 2.5mg  BID , continue Aspirin 81mg  daily.  Genetic evaluation - BRIP1 mutation  Chemotherapy-induced nausea Continue home antiemetics.    Encounter for antineoplastic chemotherapy Chemotherapy treatment as planned.   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  Grade 1/ intermittent neuropathy of fingertips and toes.  Continue Gabapentin 300mg  PRN   Follow up 3 weeks   All questions were answered. The patient knows to call the clinic with any problems, questions or concerns.  Rickard Patience, MD, PhD [facility] Hematology Oncology 04/24/2023    HISTORY OF PRESENTING ILLNESS:  Nancy [name] 58 y.o. female presents to establish care for  peritoneal carcinomatosis I have reviewed her chart and materials related to her cancer extensively and collaborated history with the patient. 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 Aortic atherosclerosis    11/06/2022 Initial Diagnosis   Primary peritoneal carcinomatosis   -presented to emergency room for evaluation of abdominal pain/bloating, poor oral intake, nonbloody nonbilious vomiting  Patient underwent paracentesis, cytology showed malignant cells. IHC can not be added.    11/14/2022 Imaging   CT chest w contrast  1. Prominent subcentimeter right retrocrural lymph nodes, metastatic disease can not be excluded. 2. Nonspecific ground-glass nodule of the superior portion of the right lower lobe measuring 7 mm. Recommend attention on follow-up. 3. Small left pleural effusion. 4. Partially visualized large volume abdominal ascites and peritoneal thickening, volume of ascites appears increased when compared with the prior CT. 5. Coronary artery calcifications and aortic Atherosclerosis (ICD10-I70.0).   11/18/2022 Procedure   Medi port placement by IR   11/18/2022 Procedure   Omentum biopsy showed  1. Omentum, biopsy,  :   - INVOLVEMENT BY HIGH-GRADE SEROUS CARCINOMA OF GYNECOLOGIC ORIGIN.   Diagnosis Note : The carcinoma is positive for cytokeratin 7, PAX8, WT1, and ER.  Cytokeratin 20 and GATA3 are negative.  P53 is overexpressed.  The morphologic findings in conjunction with the pattern of immunohistochemistry support the  above diagnosis.   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 The report date is 11/25/2022.  The CancerNext-Expanded + RNAinsight gene panel offered by [employer] and includes sequencing and rearrangement analysis for the following 71 genes: AIP, ALK, APC*, ATM*, AXIN2, BAP1, BARD1, BMPR1A, BRCA1*, BRCA2*, BRIP1*, CDC73, CDH1*,CDK4, CDKN1B, CDKN2A, CHEK2*, CTNNA1, DICER1, FH,  FLCN, KIF1B, LZTR1, MAX, MEN1, MET, MLH1*, MSH2*, MSH3, MSH6*, MUTYH*, NF1*, NF2, NTHL1, PALB2*, PHOX2B, PMS2*, POT1, PRKAR1A, PTCH1, PTEN*, RAD51C*, RAD51D*,RB1, RET, SDHA, SDHAF2, SDHB, SDHC, SDHD, SMAD4, SMARCA4, SMARCB1, SMARCE1, STK11, SUFU, TMEM127, TP53*,TSC1, TSC2, VHL; EGFR, EGLN1, HOXB13, KIT, MITF, PDGFRA, POLD1 and POLE (sequencing only); EPCAM and GREM1 (deletion/duplication only).    01/13/2023 Imaging   CT chest abdomen pelvis w contrast showed  Decreasing ascites, trace residual. The areas of omental caking and nodularity along the perineum is decreasing as well with significant residual particularly in the upper anterior abdomen.   Stable small mesenteric and retroperitoneal nodes. Resolved left pleural effusion. No developing new mass lesion. Small left renal angiomyolipoma and upper pole nonobstructing renal stone.   02/20/2023 Surgery    Interval debulking R1 - robotic assisted laparoscopic total hysterectomy, bilateral salpingo-oophorectomy, lysis of adhesions, peritoneal stripping, and omentectomy with stage IIIC high grade serous carcinoma of bilateral ovaries   A. Uterus, bilateral fallopian tubes, and bilateral ovaries, total hysterectomy and bilateral salpingo-oophorectomy: Residual high grade serous carcinoma (1.6 cm) involving bilateral ovaries, left fallopian tube, and uterine serosa.   Additional findings:  Uterus with leiomyomata (up to 1.1 cm) Right fallopian tube with no pathologic diagnosis See synoptic report.   B. Peritoneal nodules, excision: High grade serous carcinoma (up to 0.1 cm).  C. Omentum, omentectomy:  High grade serous carcinoma (0.7 cm). Moderate treatment response (CRS2).     03/04/2023 Cancer Staging   Staging form: Ovary, AJCC 7th Edition - Pathologic stage from 03/04/2023: FIGO Stage IIIC (cM0) - Signed by Rickard Patience, MD on 03/13/2023 Stage prefix: Initial diagnosis Laterality: Bilateral   History of sepsis secondary to diabetic foot  infection/osteomyelitis status post left BKA done on 09/24/2022,    Today she presents to for evaluation prior to chemotherapy. Accompanied by significant other.  + nausea, manageable with antiemetics.  No other new complaints.  Denies any abdominal pain, vomiting, diarrhea. Appetite is good.  Intermittent fingertip and toe numbness, she take gabapentin PRN    MEDICAL HISTORY:  Past Medical History:  Diagnosis Date   Abnormal cardiovascular stress test 09/21/2018   Formatting of this note might be different from the original. Lexiscan Myoview 09/16/2018 revealed mild anterior ischemia   Adverse effect of motion 05/10/2014   AKI (acute kidney injury) (HCC) 10/22/2022   Anxiety    Arthritis    r knee   Asthma    Breast cyst 05/10/2014   CAD (coronary artery disease)    a.) LHC 06/04/2009 at [facility]; non-obstructive CAD. b.) CTA with FFR 10/08/2018: extensive mixed plaque proximal to mid LAD (51-69%); Coronary Ca score 217; FFR 0.71 dPDA, 0.86 mLAD, 0.87 dLCx.   CCF (congestive cardiac failure) (HCC) 06/06/2008   a.) 30% EF. b.) TTE 06/03/2011: EF >55%; triv MR. c.) TTE 04/27/2018: EF 55%; mild LVH; triv PR, mild MR/TR; G1DD.   Cellulitis of left lower extremity 09/18/2022   Chest pain with high risk for cardiac etiology 07/02/2016   Chronic use of opiate drug for therapeutic purpose 02/13/2022   Formatting of this note might be different from the original. [facility] Pain Contract signed on 04/18/16 & updated 03/04/17; UDS done on 04/18/16.   Complication of anesthesia    Diabetic foot infection (HCC) 03/13/2015   Diabetic foot ulcer (HCC) 02/08/2021   Diabetic ulcer of left heel associated with type 2 diabetes mellitus (HCC) 02/08/2021   Eczema    Family history of adverse reaction to anesthesia    a.) PONV in mother  and grandmother   Gas gangrene (HCC) 09/18/2022   GERD (gastroesophageal reflux disease) 05/10/2014   History of kidney stones    History of MSSA bacteremia due to osteomyelitis L  foot 09/18/22 10/22/2022   HLD (hyperlipidemia)    Hyponatremia 03/13/2015   Migraines    Motion sickness    MSSA bacteremia 09/20/2022   Osteomyelitis (HCC) 09/18/2022   Other acute osteomyelitis, left ankle and foot (HCC) 02/13/2022   Panic attacks    Peripheral edema 04/27/2018   Pneumonia    PONV (postoperative nausea and vomiting)    Sepsis secondary to diabetic foot infection 03/13/2015   Sepsis secondary to diabetic foot infection 03/13/2015   T2DM (type 2 diabetes mellitus) (HCC)    Takotsubo cardiomyopathy / transient apical balooning syndrome / stress-induced cardiomyopathy 05/10/2014   Unspecified essential hypertension     SURGICAL HISTORY: Past Surgical History:  Procedure Laterality Date   AMPUTATION Left 09/19/2022   Procedure: AMPUTATION BELOW KNEE;  Surgeon: Renford Dills, MD;  Location: [facility];  Service: Vascular;  Laterality: Left;   AMPUTATION Left 09/24/2022   Procedure: AMPUTATION BELOW KNEE WITH WOUND CLOSURE;  Surgeon: Renford Dills, MD;  Location: [facility];  Service: Vascular;  Laterality: Left;   AMPUTATION TOE Left 03/16/2015   Procedure: left fifth toe amputation with incision and drainage;  Surgeon: Gwyneth Revels, DPM;  Location: [facility];  Service: Podiatry;  Laterality: Left;   APPLICATION OF WOUND VAC Left 02/14/2021   Procedure: APPLICATION OF WOUND VAC;  Surgeon: Gwyneth Revels, DPM;  Location: [facility];  Service: Podiatry;  Laterality: Left;   APPLICATION OF WOUND VAC Left 04/16/2021   Procedure: APPLICATION OF WOUND VAC;  Surgeon: Peggye Form, DO;  Location: [facility];  Service: Plastics;  Laterality: Left;   CARDIAC CATHETERIZATION Left 06/04/2009   Procedure: CARDIAC CATHETERIZATION; Location: [facility]   CHOLECYSTECTOMY     DEBRIDEMENT AND CLOSURE WOUND Left 04/16/2021   Procedure: DEBRIDEMENT AND CLOSURE WOUND;  Surgeon: Peggye Form, DO;  Location: [facility];  Service:  Plastics;  Laterality: Left;  1 hour   IR IMAGING GUIDED PORT INSERTION  11/18/2022   IRRIGATION AND DEBRIDEMENT FOOT Left 02/08/2021   Procedure: IRRIGATION AND DEBRIDEMENT FOOT - LFT HEEL ULCER;  Surgeon: Gwyneth Revels, DPM;  Location: [facility];  Service: Podiatry;  Laterality: Left;   IRRIGATION AND DEBRIDEMENT FOOT Left 02/14/2021   Procedure: IRRIGATION AND DEBRIDEMENT LEFT HEEL;  Surgeon: Gwyneth Revels, DPM;  Location: [facility];  Service: Podiatry;  Laterality: Left;   kidney stone removal     KNEE ARTHROSCOPY W/ MENISCAL REPAIR     NOSE SURGERY  01/07/1989   due to fracture   TEE WITHOUT CARDIOVERSION N/A 09/26/2022   Procedure: TRANSESOPHAGEAL ECHOCARDIOGRAM;  Surgeon: Clotilde Dieter, DO;  Location: [facility];  Service: Cardiovascular;  Laterality: N/A;   TOE AMPUTATION     second toe   TONSILLECTOMY     TOTAL KNEE ARTHROPLASTY  01/11/2011   Procedure: TOTAL KNEE ARTHROPLASTY;  Surgeon: Harvie Junior;  Location: [facility];  Service: Orthopedics;  Laterality: Right;  COMPUTER ASSISTED TOTAL KNEE REPLACEMENT    SOCIAL HISTORY: Social History   Socioeconomic History   Marital status: Married    Spouse name: Not on file   Number of children: Not on file   Years of education: Not on file   Highest education level: Not on file  Occupational History   Not on file  Tobacco Use  Smoking status: Former    Current packs/day: 0.00    Types: Cigarettes    Quit date: 03/12/1985    Years since quitting: 38.1   Smokeless tobacco: Never  Vaping Use   Vaping status: Never Used  Substance and Sexual Activity   Alcohol use: No   Drug use: No   Sexual activity: Yes    Birth control/protection: Post-menopausal  Other Topics Concern   Not on file  Social History Narrative   Wife, [name], at bedside. One indoor pet, dog.   Social Drivers of Corporate investment banker Strain: Not on file  Food Insecurity: No Food Insecurity (10/22/2022)   Hunger Vital Sign    Worried About Running Out  of Food in the Last Year: Never true    Ran Out of Food in the Last Year: Never true  Transportation Needs: No Transportation Needs (10/22/2022)   PRAPARE - Administrator, Civil Service (Medical): No    Lack of Transportation (Non-Medical): No  Physical Activity: Not on file  Stress: Not on file  Social Connections: Not on file  Intimate Partner Violence: Not At Risk (10/22/2022)   Humiliation, Afraid, Rape, and Kick questionnaire    Fear of Current or Ex-Partner: No    Emotionally Abused: No    Physically Abused: No    Sexually Abused: No    FAMILY HISTORY: Family History  Problem Relation Age of Onset   Transient ischemic attack Mother    Asthma Mother    Hypertension Mother    Lung cancer Father 38   Hypertension Father    Asthma Sister    Anxiety disorder Sister    Asthma Sister    Hypertension Brother    Ovarian cancer Maternal Aunt 59   Melanoma Maternal Uncle        dx 31s   Lung cancer Paternal Aunt 74   Stomach cancer Maternal Grandmother    Colon cancer Maternal Grandmother    Lymphoma Maternal Grandmother    Melanoma Maternal Grandfather 72       sun exposure   Lung cancer Paternal Grandmother 30   Kidney cancer Paternal Grandfather        dx early   Crohn's disease Other    GER disease Other    Breast cancer Neg Hx     ALLERGIES:  is allergic to shellfish allergy, codeine, morphine and codeine, rosuvastatin, percocet [oxycodone-acetaminophen], sulfa antibiotics, and theophyllines.  MEDICATIONS:  Current Outpatient Medications  Medication Sig Dispense Refill   acidophilus (RISAQUAD) CAPS capsule Take 1 capsule by mouth daily.     albuterol (PROVENTIL HFA;VENTOLIN HFA) 108 (90 Base) MCG/ACT inhaler Inhale 2 puffs into the lungs every 4 (four) hours as needed for wheezing or shortness of breath. Reported on 03/29/2015 (Patient not taking: Reported on 04/03/2023) 1 Inhaler 3   albuterol (PROVENTIL) (2.5 MG/3ML) 0.083% nebulizer solution Take 2.5  mg by nebulization as needed. (Patient not taking: Reported on 04/03/2023)     ALPRAZolam (XANAX) 0.5 MG tablet TAKE 1/2 TO 1 TABLET BY MOUTH AS NEEDED FOR PANIC ATTACKS 30 tablet 2   ascorbic acid (VITAMIN C) 500 MG tablet Take 1 tablet (500 mg total) by mouth 2 (two) times daily. 60 tablet 0   aspirin EC 81 MG tablet Take 81 mg by mouth daily.     carvedilol (COREG) 6.25 MG tablet TAKE 1 TABLET BY MOUTH TWICE A DAY 180 tablet 1   cholecalciferol (VITAMIN D3) 25 MCG (1000 UNIT) tablet Take  1,000 Units by mouth daily.     Continuous Glucose Sensor (FREESTYLE LIBRE 2 SENSOR) MISC 1 each by Does not apply route every 14 (fourteen) days. 2 each 3   Cyanocobalamin (VITAMIN B 12) 500 MCG TABS Take by mouth daily at 8 pm.     desvenlafaxine (PRISTIQ) 100 MG 24 hr tablet TAKE 1 TABLET BY MOUTH EVERY DAY 90 tablet 2   dexamethasone (DECADRON) 4 MG tablet Take 2 tablets (8mg ) by mouth daily starting the day after carboplatin for 2 days. Take with food 30 tablet 1   dicyclomine (BENTYL) 10 MG capsule Take 1 capsule (10 mg total) by mouth 4 (four) times daily -  before meals and at bedtime. 60 capsule 1   Dulaglutide (TRULICITY) 1.5 MG/0.5ML SOAJ Inject 1.5 mg into the skin once a week. 2 mL 2   ELIQUIS 2.5 MG TABS tablet TAKE 1 TABLET BY MOUTH TWICE A DAY 60 tablet 2   Ensure Max Protein (ENSURE MAX PROTEIN) LIQD Take 330 mLs (11 oz total) by mouth 2 (two) times daily.     fenofibrate 54 MG tablet TAKE 1 TABLET BY MOUTH EVERY DAY 30 tablet 11   fluconazole (DIFLUCAN) 200 MG tablet TAKE 1 TABLET BY MOUTH EVERY DAY (Patient not taking: Reported on 04/03/2023) 30 tablet 3   gabapentin (NEURONTIN) 300 MG capsule Take 1 capsule (300 mg total) by mouth at bedtime as needed. 30 capsule 3   HYDROmorphone (DILAUDID) 2 MG tablet Take 1 tablet (2 mg total) by mouth every 4 (four) hours as needed for severe pain (pain score 7-10). (Patient not taking: Reported on 04/03/2023) 60 tablet 0   ibuprofen (ADVIL) 800 MG tablet  TAKE 1 TABLET BY MOUTH EVERY 8 HOURS AS NEEDED (Patient not taking: Reported on 04/03/2023) 90 tablet 3   insulin NPH-regular Human (70-30) 100 UNIT/ML injection Inject 15 Units into the skin in the morning and at bedtime.     iron polysaccharides (NIFEREX) 150 MG capsule Take 1 capsule (150 mg total) by mouth daily. (Patient not taking: Reported on 04/03/2023) 30 capsule 2   lidocaine-prilocaine (EMLA) cream Apply to affected area once (Patient not taking: Reported on 04/03/2023) 30 g 3   lisinopril (ZESTRIL) 10 MG tablet Take 1 tablet (10 mg total) by mouth every morning AND 0.5 tablets (5 mg total) at bedtime. 45 tablet 11   metFORMIN (GLUCOPHAGE) 500 MG tablet TAKE TWO TABLETS EACH MORNING. AND 3 TABLETS IN THE EVENING AS DIRECTED 150 tablet 5   methocarbamol (ROBAXIN) 500 MG tablet Take 1 tablet (500 mg total) by mouth every 8 (eight) hours as needed for muscle spasms. 30 tablet 0   mirtazapine (REMERON) 7.5 MG tablet TAKE 1 TABLET BY MOUTH AT BEDTIME. 90 tablet 2   montelukast (SINGULAIR) 10 MG tablet TAKE 1 TABLET BY MOUTH EVERYDAY AT BEDTIME 30 tablet 5   Multiple Vitamin (MULTIVITAMIN WITH MINERALS) TABS tablet Take 1 tablet by mouth daily. 30 tablet 2   naloxone (NARCAN) nasal spray 4 mg/0.1 mL SPRAY 1 SPRAY INTO ONE NOSTRIL AS DIRECTED FOR OPIOID OVERDOSE (TURN PERSON ON SIDE AFTER DOSE. IF NO RESPONSE IN 2-3 MINUTES OR PERSON RESPONDS BUT RELAPSES, REPEAT USING A NEW SPRAY DEVICE AND SPRAY INTO THE OTHER NOSTRIL. CALL 911 AFTER USE.) * EMERGENCY USE ONLY * 1 each 0   nystatin (MYCOSTATIN/NYSTOP) powder APPLY TOPICALLY TWICE DAILY AS NEEDED 30 g 3   nystatin ointment (MYCOSTATIN) Apply 1 Application topically 2 (two) times daily. 30 g 0  ondansetron (ZOFRAN) 8 MG tablet Take 1 tablet (8 mg total) by mouth every 8 (eight) hours as needed for nausea or vomiting. Start on the third day after carboplatin. 30 tablet 1   ondansetron (ZOFRAN-ODT) 8 MG disintegrating tablet Take 1 tablet (8 mg total)  by mouth every 8 (eight) hours as needed for nausea or vomiting. 90 tablet 3   pantoprazole (PROTONIX) 40 MG tablet TAKE 1 TABLET BY MOUTH EVERY DAY 90 tablet 3   pravastatin (PRAVACHOL) 40 MG tablet TAKE 1 TABLET BY MOUTH EVERY DAY 30 tablet 5   prochlorperazine (COMPAZINE) 10 MG tablet Take 1 tablet (10 mg total) by mouth every 6 (six) hours as needed for nausea or vomiting. 30 tablet 1   Ubrogepant (UBRELVY) 100 MG TABS Take 1 tablet (100 mg total) by mouth as needed (migraine). 12 tablet 1   zinc sulfate 220 (50 Zn) MG capsule Take 1 capsule (220 mg total) by mouth daily. 30 capsule 0   No current facility-administered medications for this visit.   Facility-Administered Medications Ordered in Other Visits  Medication Dose Route Frequency Provider Last Rate Last Admin   0.9 %  sodium chloride infusion   Intravenous Continuous Rickard Patience, MD 10 mL/hr at 04/24/23 0858 New Bag at 04/24/23 0858   CARBOplatin (PARAPLATIN) 750 mg in sodium chloride 0.9 % 250 mL chemo infusion  750 mg Intravenous Once Rickard Patience, MD       famotidine (PEPCID) IVPB 20 mg premix  20 mg Intravenous Once Rickard Patience, MD 200 mL/hr at 04/24/23 0919 20 mg at 04/24/23 0919   fosaprepitant (EMEND) 150 mg in sodium chloride 0.9 % 145 mL IVPB  150 mg Intravenous Once Rickard Patience, MD       heparin lock flush 100 unit/mL  500 Units Intracatheter Once PRN Rickard Patience, MD       PACLitaxel (TAXOL) 360 mg in sodium chloride 0.9 % 500 mL chemo infusion (> 80mg /m2)  175 mg/m2 (Treatment Plan Adjusted) Intravenous Once Rickard Patience, MD        Review of Systems  Constitutional:  Negative for appetite change, chills, fatigue, fever and unexpected weight change.  HENT:   Negative for hearing loss and voice change.   Eyes:  Negative for eye problems.  Respiratory:  Negative for chest tightness, cough and shortness of breath.   Cardiovascular:  Negative for chest pain and leg swelling.  Gastrointestinal:  Negative for abdominal distention, abdominal pain  and blood in stool.  Endocrine: Negative for hot flashes.  Genitourinary:  Negative for difficulty urinating and frequency.   Musculoskeletal:  Negative for arthralgias.       S/p left  BKA   Skin:  Negative for itching and rash.  Neurological:  Positive for numbness. Negative for extremity weakness.  Hematological:  Negative for adenopathy.  Psychiatric/Behavioral:  Negative for confusion.      PHYSICAL EXAMINATION: ECOG PERFORMANCE STATUS: 1 - Symptomatic but completely ambulatory  Vitals:   04/24/23 0831  BP: (!) 142/83  Pulse: 83  Resp: 19  Temp: 98.8 F (37.1 C)  SpO2: 98%   Filed Weights   04/24/23 0831  Weight: 275 lb 12.8 oz (125.1 kg)    Physical Exam Constitutional:      General: She is not in acute distress.    Appearance: She is obese. She is not diaphoretic.  HENT:     Head: Normocephalic and atraumatic.     Mouth/Throat:     Pharynx: No oropharyngeal exudate.  Eyes:  General: No scleral icterus. Cardiovascular:     Rate and Rhythm: Normal rate and regular rhythm.  Pulmonary:     Effort: Pulmonary effort is normal. No respiratory distress.     Breath sounds: Normal breath sounds. No wheezing.  Abdominal:     General: There is no distension.     Palpations: Abdomen is soft.     Tenderness: There is no abdominal tenderness.  Musculoskeletal:        General: Normal range of motion.     Cervical back: Normal range of motion and neck supple.     Comments: S/p left BKA  Skin:    General: Skin is warm and dry.     Findings: No erythema.  Neurological:     Mental Status: She is alert and oriented to person, place, and time. Mental status is at baseline.     Cranial Nerves: No cranial nerve deficit.     Motor: No abnormal muscle tone.  Psychiatric:        Mood and Affect: Affect normal.      LABORATORY DATA:  I have reviewed the data as listed    Latest Ref Rng & Units 04/24/2023    7:58 AM 04/03/2023    7:56 AM 03/13/2023    7:58 AM  CBC   WBC 4.0 - 10.5 K/uL 5.5  6.2  7.4   Hemoglobin 12.0 - 15.0 g/dL 16.1  09.6  04.5   Hematocrit 36.0 - 46.0 % 31.0  31.3  30.9   Platelets 150 - 400 K/uL 160  254  256       Latest Ref Rng & Units 04/24/2023    7:58 AM 04/03/2023    7:56 AM 03/13/2023    7:58 AM  CMP  Glucose 70 - 99 mg/dL 409  811  914   BUN 6 - 20 mg/dL 28  23  27    Creatinine 0.44 - 1.00 mg/dL 7.82  9.56  2.13   Sodium 135 - 145 mmol/L 138  137  136   Potassium 3.5 - 5.1 mmol/L 3.8  4.1  3.9   Chloride 98 - 111 mmol/L 103  102  102   CO2 22 - 32 mmol/L 24  25  24    Calcium 8.9 - 10.3 mg/dL 8.9  9.3  8.8   Total Protein 6.5 - 8.1 g/dL 6.9  7.0  6.9   Total Bilirubin 0.0 - 1.2 mg/dL 0.4  0.4  0.4   Alkaline Phos 38 - 126 U/L 49  47  45   AST 15 - 41 U/L 23  19  18    ALT 0 - 44 U/L 23  24  16       RADIOGRAPHIC STUDIES: I have personally reviewed the radiological images as listed and agreed with the findings in the report. CT CHEST ABDOMEN PELVIS W CONTRAST Result Date: 04/20/2023 CLINICAL DATA:  Ovarian cancer restaging, peritoneal carcinomatosis * Tracking Code: BO * EXAM: CT CHEST, ABDOMEN, AND PELVIS WITH CONTRAST TECHNIQUE: Multidetector CT imaging of the chest, abdomen and pelvis was performed following the standard protocol during bolus administration of intravenous contrast. RADIATION DOSE REDUCTION: This exam was performed according to the departmental dose-optimization program which includes automated exposure control, adjustment of the mA and/or kV according to patient size and/or use of iterative reconstruction technique. CONTRAST:  OMNIPAQUE IOHEXOL 300 MG/ML SOLN additional oral enteric contrast COMPARISON:  01/13/2023 FINDINGS: CT CHEST FINDINGS Cardiovascular: Right chest port catheter. Aortic atherosclerosis. Normal heart size. Left  coronary artery calcifications. No pericardial effusion. Mediastinum/Nodes: No enlarged mediastinal, hilar, or axillary lymph nodes. Thyroid gland, trachea, and esophagus  demonstrate no significant findings. Lungs/Pleura: Diffuse bilateral bronchial wall thickening. Background of fine centrilobular nodularity throughout the lungs, most concentrated in the lung apices. No pleural effusion or pneumothorax. Musculoskeletal: No chest wall abnormality. No acute osseous findings. CT ABDOMEN PELVIS FINDINGS Hepatobiliary: No focal liver abnormality is seen. Hepatic steatosis. Status post cholecystectomy. No biliary dilatation. Pancreas: Unremarkable. No pancreatic ductal dilatation or surrounding inflammatory changes. Spleen: Normal in size without significant abnormality. Adrenals/Urinary Tract: Adrenal glands are unremarkable. Definitively benign small bilateral renal angiomyolipomas, requiring no further follow-up or characterization. Small bilateral nonobstructive renal calculi. No ureteral calculi or hydronephrosis. Bladder is unremarkable. Stomach/Bowel: Stomach is within normal limits. Appendix appears normal. No evidence of bowel wall thickening, distention, or inflammatory changes. Status post omentectomy Vascular/Lymphatic: Aortic atherosclerosis. No enlarged abdominal or pelvic lymph nodes. Reproductive: Status post hysterectomy. Other: No abdominal wall hernia or abnormality. No ascites. Diminished peritoneal stranding and nodularity throughout the abdomen, with minimal residual thickening throughout, for example in the left paracolic gutter and midline ventral abdomen (series 2, image 70) Musculoskeletal: No acute osseous findings. IMPRESSION: 1. Diminished peritoneal stranding and nodularity throughout the abdomen, with minimal residual thickening throughout, consistent with treatment response of peritoneal carcinomatosis. 2. Status post hysterectomy, oophorectomy, and omentectomy. 3. No evidence of lymphadenopathy or metastatic disease in the chest, abdomen, or pelvis. 4. Smoking-related respiratory bronchiolitis. 5. Hepatic steatosis. 6. Nonobstructive bilateral  nephrolithiasis. 7. Coronary artery disease . Aortic Atherosclerosis (ICD10-I70.0). Electronically Signed   By: Fredricka Jenny M.D.   On: 04/20/2023 16:07

## 2023-04-24 NOTE — Progress Notes (Signed)
 [facility] CSW Progress Note  Visual merchandiser met with patient in infusion to assess needs.  Patient appeared comfortable and answered questions appropriately.  Discussed [employer] program.  Patient was interested in free massages and CSW provided her with the certificates.  Provided education regarding the process.  Offered active listening and supportive counseling.    Kennth Peal, LCSW Clinical Social Worker [facility]

## 2023-04-24 NOTE — Assessment & Plan Note (Signed)
 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  Grade 1/ intermittent neuropathy of fingertips and toes.  Continue Gabapentin 300mg  PRN

## 2023-04-24 NOTE — Assessment & Plan Note (Addendum)
 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 Post treatment CT showed excellent response. S/p Debulking R1 on 02/20/2023, s/p 2 cycles of adjuvant carboplatin taxol Repeat CT results were reviewed with patient- minimal residual peritoneal thickening.  Discussed case with Gynonc Dr. Marella Shams, recommend additional 2 cycles followed by Olaparib maintenance.  Labs are reviewed and discussed with patient. Proceed with carboplatin and Taxol  Khorana score 3  on Eliquis 2.5mg  BID , continue Aspirin 81mg  daily.  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

## 2023-04-24 NOTE — Assessment & Plan Note (Signed)
Chemotherapy treatment as planned. 

## 2023-04-24 NOTE — Assessment & Plan Note (Signed)
 Continue home antiemetics.

## 2023-04-24 NOTE — Patient Instructions (Signed)
 [facility] - A DEPT OF [facility]  Discharge Instructions: Thank you for choosing [facility] to provide your oncology and hematology care.  If you have a lab appointment with the Cancer Center, please go directly to the Cancer Center and check in at the registration area.  Wear comfortable clothing and clothing appropriate for easy access to any Portacath or PICC line.   We strive to give you quality time with your provider. You may need to reschedule your appointment if you arrive late (15 or more minutes).  Arriving late affects you and other patients whose appointments are after yours.  Also, if you miss three or more appointments without notifying the office, you may be dismissed from the clinic at the provider's discretion.      For prescription refill requests, have your pharmacy contact our office and allow 72 hours for refills to be completed.    Today you received the following chemotherapy and/or immunotherapy agents Taxol and Carboplatin       To help prevent nausea and vomiting after your treatment, we encourage you to take your nausea medication as directed.  BELOW ARE SYMPTOMS THAT SHOULD BE REPORTED IMMEDIATELY: *FEVER GREATER THAN 100.4 F (38 C) OR HIGHER *CHILLS OR SWEATING *NAUSEA AND VOMITING THAT IS NOT CONTROLLED WITH YOUR NAUSEA MEDICATION *UNUSUAL SHORTNESS OF BREATH *UNUSUAL BRUISING OR BLEEDING *URINARY PROBLEMS (pain or burning when urinating, or frequent urination) *BOWEL PROBLEMS (unusual diarrhea, constipation, pain near the anus) TENDERNESS IN MOUTH AND THROAT WITH OR WITHOUT PRESENCE OF ULCERS (sore throat, sores in mouth, or a toothache) UNUSUAL RASH, SWELLING OR PAIN  UNUSUAL VAGINAL DISCHARGE OR ITCHING   Items with * indicate a potential emergency and should be followed up as soon as possible or go to the Emergency Department if any problems should occur.  Please show the CHEMOTHERAPY ALERT CARD or  IMMUNOTHERAPY ALERT CARD at check-in to the Emergency Department and triage nurse.  Should you have questions after your visit or need to cancel or reschedule your appointment, please contact [facility] - A DEPT OF [facility]  [phone] and follow the prompts.  Office hours are 8:00 a.m. to 4:30 p.m. Monday - Friday. Please note that voicemails left after 4:00 p.m. may not be returned until the following business day.  We are closed weekends and major holidays. You have access to a nurse at all times for urgent questions. Please call the main number to the clinic [phone] and follow the prompts.  For any non-urgent questions, you may also contact your provider using MyChart. We now offer e-Visits for anyone 72 and older to request care online for non-urgent symptoms. For details visit [email or web address].   Also download the MyChart app! Go to the app store, search "MyChart", open the app, select [place], and log in with your MyChart username and password.

## 2023-04-25 LAB — CA 125: Cancer Antigen (CA) 125: 7.7 U/mL (ref 0.0–38.1)

## 2023-04-30 DIAGNOSIS — S88112A Complete traumatic amputation at level between knee and ankle, left lower leg, initial encounter: Secondary | ICD-10-CM | POA: Diagnosis not present

## 2023-05-11 ENCOUNTER — Other Ambulatory Visit: Payer: Self-pay | Admitting: Family

## 2023-05-14 MED FILL — Fosaprepitant Dimeglumine For IV Infusion 150 MG (Base Eq): INTRAVENOUS | Qty: 5 | Status: AC

## 2023-05-15 ENCOUNTER — Other Ambulatory Visit

## 2023-05-15 ENCOUNTER — Inpatient Hospital Stay (HOSPITAL_BASED_OUTPATIENT_CLINIC_OR_DEPARTMENT_OTHER): Admitting: Oncology

## 2023-05-15 ENCOUNTER — Inpatient Hospital Stay: Attending: Oncology

## 2023-05-15 ENCOUNTER — Encounter: Payer: Self-pay | Admitting: Oncology

## 2023-05-15 ENCOUNTER — Telehealth: Payer: Self-pay | Admitting: Pharmacist

## 2023-05-15 ENCOUNTER — Ambulatory Visit

## 2023-05-15 ENCOUNTER — Inpatient Hospital Stay

## 2023-05-15 ENCOUNTER — Telehealth: Payer: Self-pay | Admitting: Pharmacy Technician

## 2023-05-15 ENCOUNTER — Other Ambulatory Visit (HOSPITAL_COMMUNITY): Payer: Self-pay

## 2023-05-15 ENCOUNTER — Ambulatory Visit: Admitting: Oncology

## 2023-05-15 VITALS — BP 160/74 | HR 91 | Temp 97.3°F | Resp 15 | Wt 277.0 lb

## 2023-05-15 VITALS — BP 174/79 | HR 88 | Temp 97.9°F | Resp 17

## 2023-05-15 DIAGNOSIS — T451X5A Adverse effect of antineoplastic and immunosuppressive drugs, initial encounter: Secondary | ICD-10-CM

## 2023-05-15 DIAGNOSIS — Z79630 Long term (current) use of alkylating agent: Secondary | ICD-10-CM | POA: Diagnosis not present

## 2023-05-15 DIAGNOSIS — C569 Malignant neoplasm of unspecified ovary: Secondary | ICD-10-CM

## 2023-05-15 DIAGNOSIS — G629 Polyneuropathy, unspecified: Secondary | ICD-10-CM

## 2023-05-15 DIAGNOSIS — C786 Secondary malignant neoplasm of retroperitoneum and peritoneum: Secondary | ICD-10-CM | POA: Insufficient documentation

## 2023-05-15 DIAGNOSIS — R110 Nausea: Secondary | ICD-10-CM

## 2023-05-15 DIAGNOSIS — C563 Malignant neoplasm of bilateral ovaries: Secondary | ICD-10-CM | POA: Diagnosis not present

## 2023-05-15 DIAGNOSIS — Z5111 Encounter for antineoplastic chemotherapy: Secondary | ICD-10-CM

## 2023-05-15 DIAGNOSIS — D508 Other iron deficiency anemias: Secondary | ICD-10-CM | POA: Diagnosis not present

## 2023-05-15 LAB — CBC WITH DIFFERENTIAL (CANCER CENTER ONLY)
Abs Immature Granulocytes: 0.07 10*3/uL (ref 0.00–0.07)
Basophils Absolute: 0.1 10*3/uL (ref 0.0–0.1)
Basophils Relative: 1 %
Eosinophils Absolute: 0 10*3/uL (ref 0.0–0.5)
Eosinophils Relative: 1 %
HCT: 30.3 % — ABNORMAL LOW (ref 36.0–46.0)
Hemoglobin: 10.2 g/dL — ABNORMAL LOW (ref 12.0–15.0)
Immature Granulocytes: 1 %
Lymphocytes Relative: 46 %
Lymphs Abs: 3.1 10*3/uL (ref 0.7–4.0)
MCH: 34.9 pg — ABNORMAL HIGH (ref 26.0–34.0)
MCHC: 33.7 g/dL (ref 30.0–36.0)
MCV: 103.8 fL — ABNORMAL HIGH (ref 80.0–100.0)
Monocytes Absolute: 0.8 10*3/uL (ref 0.1–1.0)
Monocytes Relative: 12 %
Neutro Abs: 2.6 10*3/uL (ref 1.7–7.7)
Neutrophils Relative %: 39 %
Platelet Count: 119 10*3/uL — ABNORMAL LOW (ref 150–400)
RBC: 2.92 MIL/uL — ABNORMAL LOW (ref 3.87–5.11)
RDW: 14.7 % (ref 11.5–15.5)
WBC Count: 6.6 10*3/uL (ref 4.0–10.5)
nRBC: 0.5 % — ABNORMAL HIGH (ref 0.0–0.2)

## 2023-05-15 LAB — CMP (CANCER CENTER ONLY)
ALT: 32 U/L (ref 0–44)
AST: 26 U/L (ref 15–41)
Albumin: 3.5 g/dL (ref 3.5–5.0)
Alkaline Phosphatase: 50 U/L (ref 38–126)
Anion gap: 13 (ref 5–15)
BUN: 28 mg/dL — ABNORMAL HIGH (ref 6–20)
CO2: 21 mmol/L — ABNORMAL LOW (ref 22–32)
Calcium: 8.7 mg/dL — ABNORMAL LOW (ref 8.9–10.3)
Chloride: 102 mmol/L (ref 98–111)
Creatinine: 1.02 mg/dL — ABNORMAL HIGH (ref 0.44–1.00)
GFR, Estimated: >60 mL/min (ref >60)
Glucose, Bld: 235 mg/dL — ABNORMAL HIGH (ref 70–99)
Potassium: 4.2 mmol/L (ref 3.5–5.1)
Sodium: 136 mmol/L (ref 135–145)
Total Bilirubin: 0.4 mg/dL (ref 0.0–1.2)
Total Protein: 6.9 g/dL (ref 6.5–8.1)

## 2023-05-15 MED ORDER — HEPARIN SOD (PORK) LOCK FLUSH 100 UNIT/ML IV SOLN
500.0000 [IU] | Freq: Once | INTRAVENOUS | Status: AC | PRN
  Administered 2023-05-15: 500 [IU]
  Filled 2023-05-15: qty 5

## 2023-05-15 MED ORDER — FAMOTIDINE IN NACL 20-0.9 MG/50ML-% IV SOLN
20.0000 mg | Freq: Once | INTRAVENOUS | Status: AC
  Administered 2023-05-15: 20 mg via INTRAVENOUS
  Filled 2023-05-15: qty 50

## 2023-05-15 MED ORDER — SODIUM CHLORIDE 0.9 % IV SOLN
150.0000 mg | Freq: Once | INTRAVENOUS | Status: AC
  Administered 2023-05-15: 150 mg via INTRAVENOUS
  Filled 2023-05-15: qty 150

## 2023-05-15 MED ORDER — DEXAMETHASONE SODIUM PHOSPHATE 10 MG/ML IJ SOLN
10.0000 mg | Freq: Once | INTRAMUSCULAR | Status: AC
  Administered 2023-05-15: 10 mg via INTRAVENOUS
  Filled 2023-05-15: qty 1

## 2023-05-15 MED ORDER — SODIUM CHLORIDE 0.9 % IV SOLN
175.0000 mg/m2 | Freq: Once | INTRAVENOUS | Status: AC
Start: 2023-05-15 — End: 2023-05-15
  Administered 2023-05-15: 360 mg via INTRAVENOUS
  Filled 2023-05-15: qty 60

## 2023-05-15 MED ORDER — DIPHENHYDRAMINE HCL 50 MG/ML IJ SOLN
50.0000 mg | Freq: Once | INTRAMUSCULAR | Status: AC
  Administered 2023-05-15: 50 mg via INTRAVENOUS
  Filled 2023-05-15: qty 1

## 2023-05-15 MED ORDER — PALONOSETRON HCL INJECTION 0.25 MG/5ML
0.2500 mg | Freq: Once | INTRAVENOUS | Status: AC
Start: 2023-05-15 — End: 2023-05-15
  Administered 2023-05-15: 0.25 mg via INTRAVENOUS
  Filled 2023-05-15: qty 5

## 2023-05-15 MED ORDER — SODIUM CHLORIDE 0.9 % IV SOLN
INTRAVENOUS | Status: DC
Start: 2023-05-15 — End: 2023-05-15
  Filled 2023-05-15: qty 250

## 2023-05-15 MED ORDER — SODIUM CHLORIDE 0.9 % IV SOLN
681.5000 mg | Freq: Once | INTRAVENOUS | Status: AC
  Administered 2023-05-15: 680 mg via INTRAVENOUS
  Filled 2023-05-15: qty 68

## 2023-05-15 NOTE — Assessment & Plan Note (Signed)
Chemotherapy treatment as planned. 

## 2023-05-15 NOTE — Assessment & Plan Note (Signed)
 Lab Results  Component Value Date   HGB 10.2 (L) 05/15/2023   TIBC 367 03/13/2023   IRONPCTSAT 15 03/13/2023   FERRITIN 57 03/13/2023    continue oral iron  supplementation. Stable Hb

## 2023-05-15 NOTE — Assessment & Plan Note (Addendum)
 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 Post treatment CT showed excellent response. S/p Debulking R1 on 02/20/2023, s/p 2 cycles of adjuvant carboplatin  taxol  Repeat CT results were reviewed with patient- minimal residual peritoneal thickening.  Discussed case with Gynonc Dr. Marella Shams, recommend additional 2 cycles followed by Olaparib maintenance.  Labs are reviewed and discussed with patient. Proceed with carboplatin  and Taxol  Plan to start Olaparib maintenance at next visit,   Khorana score 3  on Eliquis  2.5mg  BID , continue Aspirin  81mg  daily.  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

## 2023-05-15 NOTE — Assessment & Plan Note (Signed)
 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  Grade 1/ intermittent neuropathy of fingertips and toes.  Continue Gabapentin 300mg  PRN

## 2023-05-15 NOTE — Progress Notes (Signed)
 1230: pt reports a new onset of pinpoint rash on right hand. Pt denies pain,itching or any other symptoms. VS stable. Per Dr. Wilhelmenia Harada continue to monitor and directed pt to take Benadryl  PRN. Pt aware of Dr. Jackqueline Mason advice, and to notify clinic if new symptoms occur or rash worsens. Pt verbalizes understanding. Pt stable.

## 2023-05-15 NOTE — Progress Notes (Signed)
 Hematology/Oncology Progress note [phone] [phone]      CHIEF COMPLAINTS  High grade serous carcinoma of ovaries.   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 Post treatment CT showed excellent response. S/p Debulking R1 on 02/20/2023, s/p 2 cycles of adjuvant carboplatin  taxol  Repeat CT results were reviewed with patient- minimal residual peritoneal thickening.  Discussed case with Gynonc Dr. Marella Shams, recommend additional 2 cycles followed by Olaparib maintenance.  Labs are reviewed and discussed with patient. Proceed with carboplatin  and Taxol  Plan to start Olaparib maintenance at next visit,   Khorana score 3  on Eliquis  2.5mg  BID , continue Aspirin  81mg  daily.  Genetic evaluation - BRIP1 mutation  Chemotherapy-induced nausea Continue home antiemetics.    Encounter for antineoplastic chemotherapy Chemotherapy treatment as planned.   IDA (iron  deficiency anemia) Lab Results  Component Value Date   HGB 10.2 (L) 05/15/2023   TIBC 367 03/13/2023   IRONPCTSAT 15 03/13/2023   FERRITIN 57 03/13/2023    continue oral iron  supplementation. Stable Hb  Neuropathy Pre existing neuropathy after left lower extremity amputation.  Grade 1/ intermittent neuropathy of fingertips and toes.  Continue Gabapentin  300mg  PRN   Follow up 3 weeks   All questions were answered. The patient knows to call the clinic with any problems, questions or concerns.  Timmy Forbes, MD, PhD [facility] Hematology Oncology 05/15/2023    HISTORY OF PRESENTING ILLNESS:  Nancy [name] 58 y.o. female presents to establish care for  peritoneal carcinomatosis I have reviewed her chart and materials related to her cancer extensively and collaborated history with the patient. 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 Aortic atherosclerosis    11/06/2022 Initial Diagnosis   Primary peritoneal carcinomatosis   -presented to emergency room for evaluation of abdominal pain/bloating, poor oral intake, nonbloody nonbilious vomiting  Patient underwent paracentesis, cytology showed malignant cells. IHC can not be added.    11/14/2022 Imaging   CT chest w contrast  1. Prominent subcentimeter right retrocrural lymph nodes, metastatic disease can not be excluded. 2. Nonspecific ground-glass nodule of the superior portion of the right lower lobe measuring 7 mm. Recommend attention on follow-up. 3. Small left pleural effusion. 4. Partially visualized large volume abdominal ascites and peritoneal thickening, volume of ascites appears increased when compared with the prior CT. 5. Coronary artery calcifications and aortic Atherosclerosis (ICD10-I70.0).   11/18/2022 Procedure   Medi port placement by IR   11/18/2022 Procedure   Omentum biopsy showed  1. Omentum, biopsy,  :   - INVOLVEMENT BY HIGH-GRADE SEROUS CARCINOMA OF GYNECOLOGIC ORIGIN.   Diagnosis Note : The carcinoma is positive for cytokeratin 7, PAX8, WT1, and ER.  Cytokeratin 20 and GATA3 are negative.  P53 is overexpressed.  The morphologic findings in conjunction with the pattern of immunohistochemistry support the  above diagnosis.   MyRIAD HRD positive    11/21/2022 -  Chemotherapy   Patient is on Treatment Plan : Carboplatin  + Paclitaxel  q21d      Genetic Testing   Pathogenic variant in BRIP1 called  p.S624* (c.1871C>A) identified on the  [employer] CancerNext-Expanded+RNA panel. The report date is 11/25/2022.  The  CancerNext-Expanded + RNAinsight gene panel offered by [employer] and includes sequencing and rearrangement analysis for the following 71 genes: AIP, ALK, APC*, ATM*, AXIN2, BAP1, BARD1, BMPR1A, BRCA1*, BRCA2*, BRIP1*, CDC73, CDH1*,CDK4, CDKN1B, CDKN2A, CHEK2*, CTNNA1, DICER1, FH, FLCN, KIF1B, LZTR1, MAX, MEN1, MET, MLH1*, MSH2*, MSH3, MSH6*, MUTYH*, NF1*, NF2, NTHL1, PALB2*, PHOX2B, PMS2*, POT1, PRKAR1A, PTCH1, PTEN*, RAD51C*, RAD51D*,RB1, RET, SDHA, SDHAF2, SDHB, SDHC, SDHD, SMAD4, SMARCA4, SMARCB1, SMARCE1, STK11, SUFU, TMEM127, TP53*,TSC1, TSC2, VHL; EGFR, EGLN1, HOXB13, KIT, MITF, PDGFRA, POLD1 and POLE (sequencing only); EPCAM and GREM1 (deletion/duplication only).    01/13/2023 Imaging   CT chest abdomen pelvis w contrast showed  Decreasing ascites, trace residual. The areas of omental caking and nodularity along the perineum is decreasing as well with significant residual particularly in the upper anterior abdomen.   Stable small mesenteric and retroperitoneal nodes. Resolved left pleural effusion. No developing new mass lesion. Small left renal angiomyolipoma and upper pole nonobstructing renal stone.   02/20/2023 Surgery    Interval debulking R1 - robotic assisted laparoscopic total hysterectomy, bilateral salpingo-oophorectomy, lysis of adhesions, peritoneal stripping, and omentectomy with stage IIIC high grade serous carcinoma of bilateral ovaries   A. Uterus, bilateral fallopian tubes, and bilateral ovaries, total hysterectomy and bilateral salpingo-oophorectomy: Residual high grade serous carcinoma (1.6 cm) involving bilateral ovaries, left fallopian tube, and uterine serosa.   Additional findings:  Uterus with leiomyomata (up to 1.1 cm) Right fallopian tube with no pathologic diagnosis See synoptic report.   B. Peritoneal nodules, excision: High grade serous carcinoma (up to 0.1 cm).  C. Omentum, omentectomy:  High grade serous carcinoma (0.7 cm). Moderate treatment  response (CRS2).     03/04/2023 Cancer Staging   Staging form: Ovary, AJCC 7th Edition - Pathologic stage from 03/04/2023: FIGO Stage IIIC (cM0) - Signed by Timmy Forbes, MD on 03/13/2023 Stage prefix: Initial diagnosis Laterality: Bilateral   History of sepsis secondary to diabetic foot infection/osteomyelitis status post left BKA done on 09/24/2022,    Today she presents to for evaluation prior to chemotherapy. Accompanied by significant other.  + nausea, manageable with antiemetics.  Feels more tired after last chemotherapy No other new complaints.  Denies any abdominal pain, vomiting, diarrhea. Appetite is good.  Intermittent fingertip and toe numbness, she take gabapentin  PRN    MEDICAL HISTORY:  Past Medical History:  Diagnosis Date   Abnormal cardiovascular stress test 09/21/2018   Formatting of this note might be different from the original. Lexiscan Myoview 09/16/2018 revealed mild anterior ischemia   Adverse effect of motion 05/10/2014   AKI (acute kidney injury) (HCC) 10/22/2022   Anxiety    Arthritis    r knee   Asthma    Breast cyst 05/10/2014   CAD (coronary artery disease)    a.) LHC 06/04/2009 at [facility]; non-obstructive CAD. b.) CTA with FFR 10/08/2018: extensive mixed plaque proximal to mid LAD (51-69%); Coronary Ca score 217; FFR 0.71 dPDA, 0.86 mLAD, 0.87 dLCx.   CCF (congestive cardiac failure) (HCC) 06/06/2008   a.) 30% EF. b.) TTE 06/03/2011: EF >55%; triv MR. c.) TTE 04/27/2018: EF 55%; mild LVH; triv PR, mild MR/TR; G1DD.   Cellulitis of left lower extremity 09/18/2022   Chest pain with high risk for cardiac etiology 07/02/2016   Chronic use of opiate drug for therapeutic purpose 02/13/2022   Formatting of this note might be different from the original. [facility] Pain Contract signed on 04/18/16 & updated 03/04/17; UDS done on  04/18/16.   Complication of anesthesia    Diabetic foot infection (HCC) 03/13/2015   Diabetic foot ulcer (HCC) 02/08/2021   Diabetic ulcer of left  heel associated with type 2 diabetes mellitus (HCC) 02/08/2021   Eczema    Family history of adverse reaction to anesthesia    a.) PONV in mother and grandmother   Gas gangrene (HCC) 09/18/2022   GERD (gastroesophageal reflux disease) 05/10/2014   History of kidney stones    History of MSSA bacteremia due to osteomyelitis L foot 09/18/22 10/22/2022   HLD (hyperlipidemia)    Hyponatremia 03/13/2015   Migraines    Motion sickness    MSSA bacteremia 09/20/2022   Osteomyelitis (HCC) 09/18/2022   Other acute osteomyelitis, left ankle and foot (HCC) 02/13/2022   Panic attacks    Peripheral edema 04/27/2018   Pneumonia    PONV (postoperative nausea and vomiting)    Sepsis secondary to diabetic foot infection 03/13/2015   Sepsis secondary to diabetic foot infection 03/13/2015   T2DM (type 2 diabetes mellitus) (HCC)    Takotsubo cardiomyopathy / transient apical balooning syndrome / stress-induced cardiomyopathy 05/10/2014   Unspecified essential hypertension     SURGICAL HISTORY: Past Surgical History:  Procedure Laterality Date   AMPUTATION Left 09/19/2022   Procedure: AMPUTATION BELOW KNEE;  Surgeon: Jackquelyn Mass, MD;  Location: [facility];  Service: Vascular;  Laterality: Left;   AMPUTATION Left 09/24/2022   Procedure: AMPUTATION BELOW KNEE WITH WOUND CLOSURE;  Surgeon: Jackquelyn Mass, MD;  Location: [facility];  Service: Vascular;  Laterality: Left;   AMPUTATION TOE Left 03/16/2015   Procedure: left fifth toe amputation with incision and drainage;  Surgeon: Anell Baptist, DPM;  Location: [facility];  Service: Podiatry;  Laterality: Left;   APPLICATION OF WOUND VAC Left 02/14/2021   Procedure: APPLICATION OF WOUND VAC;  Surgeon: Anell Baptist, DPM;  Location: [facility];  Service: Podiatry;  Laterality: Left;   APPLICATION OF WOUND VAC Left 04/16/2021   Procedure: APPLICATION OF WOUND VAC;  Surgeon: Thornell Flirt, DO;  Location: [facility];  Service: Plastics;   Laterality: Left;   CARDIAC CATHETERIZATION Left 06/04/2009   Procedure: CARDIAC CATHETERIZATION; Location: [facility]   CHOLECYSTECTOMY     DEBRIDEMENT AND CLOSURE WOUND Left 04/16/2021   Procedure: DEBRIDEMENT AND CLOSURE WOUND;  Surgeon: Thornell Flirt, DO;  Location:  SURGERY CENTER;  Service: Plastics;  Laterality: Left;  1 hour   IR IMAGING GUIDED PORT INSERTION  11/18/2022   IRRIGATION AND DEBRIDEMENT FOOT Left 02/08/2021   Procedure: IRRIGATION AND DEBRIDEMENT FOOT - LFT HEEL ULCER;  Surgeon: Anell Baptist, DPM;  Location: [facility];  Service: Podiatry;  Laterality: Left;   IRRIGATION AND DEBRIDEMENT FOOT Left 02/14/2021   Procedure: IRRIGATION AND DEBRIDEMENT LEFT HEEL;  Surgeon: Anell Baptist, DPM;  Location: [facility];  Service: Podiatry;  Laterality: Left;   kidney stone removal     KNEE ARTHROSCOPY W/ MENISCAL REPAIR     NOSE SURGERY  01/07/1989   due to fracture   TEE WITHOUT CARDIOVERSION N/A 09/26/2022   Procedure: TRANSESOPHAGEAL ECHOCARDIOGRAM;  Surgeon: Isabell Manzanilla, DO;  Location: [facility];  Service: Cardiovascular;  Laterality: N/A;   TOE AMPUTATION     second toe   TONSILLECTOMY     TOTAL KNEE ARTHROPLASTY  01/11/2011   Procedure: TOTAL KNEE ARTHROPLASTY;  Surgeon: Boston Byers;  Location: [facility];  Service: Orthopedics;  Laterality: Right;  COMPUTER ASSISTED TOTAL KNEE REPLACEMENT  SOCIAL HISTORY: Social History   Socioeconomic History   Marital status: Married    Spouse name: Not on file   Number of children: Not on file   Years of education: Not on file   Highest education level: Not on file  Occupational History   Not on file  Tobacco Use   Smoking status: Former    Current packs/day: 0.00    Types: Cigarettes    Quit date: 03/12/1985    Years since quitting: 38.2   Smokeless tobacco: Never  Vaping Use   Vaping status: Never Used  Substance and Sexual Activity   Alcohol use: No   Drug use: No   Sexual activity: Yes     Birth control/protection: Post-menopausal  Other Topics Concern   Not on file  Social History Narrative   Wife, Bernardo Bridgeman, at bedside. One indoor pet, dog.   Social Drivers of Corporate investment banker Strain: Not on file  Food Insecurity: No Food Insecurity (10/22/2022)   Hunger Vital Sign    Worried About Running Out of Food in the Last Year: Never true    Ran Out of Food in the Last Year: Never true  Transportation Needs: No Transportation Needs (10/22/2022)   PRAPARE - Administrator, Civil Service (Medical): No    Lack of Transportation (Non-Medical): No  Physical Activity: Not on file  Stress: Not on file  Social Connections: Not on file  Intimate Partner Violence: Not At Risk (10/22/2022)   Humiliation, Afraid, Rape, and Kick questionnaire    Fear of Current or Ex-Partner: No    Emotionally Abused: No    Physically Abused: No    Sexually Abused: No    FAMILY HISTORY: Family History  Problem Relation Age of Onset   Transient ischemic attack Mother    Asthma Mother    Hypertension Mother    Lung cancer Father 3   Hypertension Father    Asthma Sister    Anxiety disorder Sister    Asthma Sister    Hypertension Brother    Ovarian cancer Maternal Aunt 49   Melanoma Maternal Uncle        dx 64s   Lung cancer Paternal Aunt 30   Stomach cancer Maternal Grandmother    Colon cancer Maternal Grandmother    Lymphoma Maternal Grandmother    Melanoma Maternal Grandfather 72       sun exposure   Lung cancer Paternal Grandmother 28   Kidney cancer Paternal Grandfather        dx early   Crohn's disease Other    GER disease Other    Breast cancer Neg Hx     ALLERGIES:  is allergic to shellfish allergy, codeine, morphine  and codeine, rosuvastatin, percocet [oxycodone -acetaminophen ], sulfa antibiotics, and theophyllines.  MEDICATIONS:  Current Outpatient Medications  Medication Sig Dispense Refill   acidophilus (RISAQUAD) CAPS capsule Take 1 capsule by  mouth daily.     ALPRAZolam  (XANAX ) 0.5 MG tablet TAKE 1/2 TO 1 TABLET BY MOUTH AS NEEDED FOR PANIC ATTACKS 30 tablet 2   ascorbic acid  (VITAMIN C ) 500 MG tablet Take 1 tablet (500 mg total) by mouth 2 (two) times daily. 60 tablet 0   aspirin  EC 81 MG tablet Take 81 mg by mouth daily.     carvedilol  (COREG ) 6.25 MG tablet TAKE 1 TABLET BY MOUTH TWICE A DAY 180 tablet 1   cholecalciferol (VITAMIN D3) 25 MCG (1000 UNIT) tablet Take 1,000 Units by mouth daily.  Continuous Glucose Sensor (FREESTYLE LIBRE 2 SENSOR) MISC 1 each by Does not apply route every 14 (fourteen) days. 2 each 3   Cyanocobalamin  (VITAMIN B 12) 500 MCG TABS Take by mouth daily at 8 pm.     desvenlafaxine (PRISTIQ) 100 MG 24 hr tablet TAKE 1 TABLET BY MOUTH EVERY DAY 90 tablet 2   dexamethasone  (DECADRON ) 4 MG tablet Take 2 tablets (8mg ) by mouth daily starting the day after carboplatin  for 2 days. Take with food 30 tablet 1   dicyclomine  (BENTYL ) 10 MG capsule Take 1 capsule (10 mg total) by mouth 4 (four) times daily -  before meals and at bedtime. 60 capsule 1   Dulaglutide  (TRULICITY ) 1.5 MG/0.5ML SOAJ INJECT 1.5 MG SUBCUTANEOUSLY ONCE A WEEK 2 mL 2   ELIQUIS  2.5 MG TABS tablet TAKE 1 TABLET BY MOUTH TWICE A DAY 60 tablet 2   Ensure Max Protein (ENSURE MAX PROTEIN) LIQD Take 330 mLs (11 oz total) by mouth 2 (two) times daily.     fenofibrate  54 MG tablet TAKE 1 TABLET BY MOUTH EVERY DAY 30 tablet 11   gabapentin  (NEURONTIN ) 300 MG capsule Take 1 capsule (300 mg total) by mouth at bedtime as needed. 30 capsule 3   insulin  NPH-regular Human (70-30) 100 UNIT/ML injection Inject 15 Units into the skin in the morning and at bedtime.     lisinopril  (ZESTRIL ) 10 MG tablet Take 1 tablet (10 mg total) by mouth every morning AND 0.5 tablets (5 mg total) at bedtime. 45 tablet 11   metFORMIN  (GLUCOPHAGE ) 500 MG tablet TAKE TWO TABLETS EACH MORNING. AND 3 TABLETS IN THE EVENING AS DIRECTED 150 tablet 5   methocarbamol  (ROBAXIN ) 500 MG  tablet Take 1 tablet (500 mg total) by mouth every 8 (eight) hours as needed for muscle spasms. 30 tablet 0   mirtazapine  (REMERON ) 7.5 MG tablet TAKE 1 TABLET BY MOUTH AT BEDTIME. 90 tablet 2   montelukast  (SINGULAIR ) 10 MG tablet TAKE 1 TABLET BY MOUTH EVERYDAY AT BEDTIME 30 tablet 5   Multiple Vitamin (MULTIVITAMIN WITH MINERALS) TABS tablet Take 1 tablet by mouth daily. 30 tablet 2   naloxone  (NARCAN ) nasal spray 4 mg/0.1 mL SPRAY 1 SPRAY INTO ONE NOSTRIL AS DIRECTED FOR OPIOID OVERDOSE (TURN PERSON ON SIDE AFTER DOSE. IF NO RESPONSE IN 2-3 MINUTES OR PERSON RESPONDS BUT RELAPSES, REPEAT USING A NEW SPRAY DEVICE AND SPRAY INTO THE OTHER NOSTRIL. CALL 911 AFTER USE.) * EMERGENCY USE ONLY * 1 each 0   nystatin  (MYCOSTATIN /NYSTOP ) powder APPLY TOPICALLY TWICE DAILY AS NEEDED 30 g 3   nystatin  ointment (MYCOSTATIN ) Apply 1 Application topically 2 (two) times daily. 30 g 0   ondansetron  (ZOFRAN ) 8 MG tablet Take 1 tablet (8 mg total) by mouth every 8 (eight) hours as needed for nausea or vomiting. Start on the third day after carboplatin . 30 tablet 1   ondansetron  (ZOFRAN -ODT) 8 MG disintegrating tablet Take 1 tablet (8 mg total) by mouth every 8 (eight) hours as needed for nausea or vomiting. 90 tablet 3   pantoprazole  (PROTONIX ) 40 MG tablet TAKE 1 TABLET BY MOUTH EVERY DAY 90 tablet 3   pravastatin  (PRAVACHOL ) 40 MG tablet TAKE 1 TABLET BY MOUTH EVERY DAY 30 tablet 5   prochlorperazine  (COMPAZINE ) 10 MG tablet Take 1 tablet (10 mg total) by mouth every 6 (six) hours as needed for nausea or vomiting. 30 tablet 1   Ubrogepant  (UBRELVY ) 100 MG TABS Take 1 tablet (100 mg total) by mouth as  needed (migraine). 12 tablet 1   zinc  sulfate 220 (50 Zn) MG capsule Take 1 capsule (220 mg total) by mouth daily. 30 capsule 0   albuterol  (PROVENTIL  HFA;VENTOLIN  HFA) 108 (90 Base) MCG/ACT inhaler Inhale 2 puffs into the lungs every 4 (four) hours as needed for wheezing or shortness of breath. Reported on 03/29/2015  (Patient not taking: Reported on 04/03/2023) 1 Inhaler 3   albuterol  (PROVENTIL ) (2.5 MG/3ML) 0.083% nebulizer solution Take 2.5 mg by nebulization as needed. (Patient not taking: Reported on 04/03/2023)     fluconazole  (DIFLUCAN ) 200 MG tablet TAKE 1 TABLET BY MOUTH EVERY DAY (Patient not taking: Reported on 05/15/2023) 30 tablet 3   HYDROmorphone  (DILAUDID ) 2 MG tablet Take 1 tablet (2 mg total) by mouth every 4 (four) hours as needed for severe pain (pain score 7-10). (Patient not taking: Reported on 05/15/2023) 60 tablet 0   ibuprofen (ADVIL) 800 MG tablet TAKE 1 TABLET BY MOUTH EVERY 8 HOURS AS NEEDED (Patient not taking: Reported on 05/15/2023) 90 tablet 3   iron  polysaccharides (NIFEREX) 150 MG capsule Take 1 capsule (150 mg total) by mouth daily. (Patient not taking: Reported on 04/03/2023) 30 capsule 2   lidocaine -prilocaine  (EMLA ) cream Apply to affected area once (Patient not taking: Reported on 05/15/2023) 30 g 3   No current facility-administered medications for this visit.   Facility-Administered Medications Ordered in Other Visits  Medication Dose Route Frequency Provider Last Rate Last Admin   0.9 %  sodium chloride  infusion   Intravenous Continuous Timmy Forbes, MD 10 mL/hr at 05/15/23 0856 New Bag at 05/15/23 0856   CARBOplatin  (PARAPLATIN ) 680 mg in sodium chloride  0.9 % 250 mL chemo infusion  680 mg Intravenous Once Timmy Forbes, MD       dexamethasone  (DECADRON ) injection 10 mg  10 mg Intravenous Once Dominique Calvey, MD       diphenhydrAMINE  (BENADRYL ) injection 50 mg  50 mg Intravenous Once Timmy Forbes, MD       famotidine  (PEPCID ) IVPB 20 mg premix  20 mg Intravenous Once Jovaughn Wojtaszek, MD       fosaprepitant  (EMEND ) 150 mg in sodium chloride  0.9 % 145 mL IVPB  150 mg Intravenous Once Annajulia Lewing, MD 450 mL/hr at 05/15/23 0858 150 mg at 05/15/23 6045   heparin  lock flush 100 unit/mL  500 Units Intracatheter Once PRN Timmy Forbes, MD       PACLitaxel  (TAXOL ) 360 mg in sodium chloride  0.9 % 500 mL chemo infusion (>  80mg /m2)  175 mg/m2 (Treatment Plan Adjusted) Intravenous Once Timmy Forbes, MD       palonosetron  (ALOXI ) injection 0.25 mg  0.25 mg Intravenous Once Timmy Forbes, MD        Review of Systems  Constitutional:  Negative for appetite change, chills, fatigue, fever and unexpected weight change.  HENT:   Negative for hearing loss and voice change.   Eyes:  Negative for eye problems.  Respiratory:  Negative for chest tightness, cough and shortness of breath.   Cardiovascular:  Negative for chest pain and leg swelling.  Gastrointestinal:  Negative for abdominal distention, abdominal pain and blood in stool.  Endocrine: Negative for hot flashes.  Genitourinary:  Negative for difficulty urinating and frequency.   Musculoskeletal:  Negative for arthralgias.       S/p left  BKA   Skin:  Negative for itching and rash.  Neurological:  Positive for numbness. Negative for extremity weakness.  Hematological:  Negative for adenopathy.  Psychiatric/Behavioral:  Negative for  confusion.      PHYSICAL EXAMINATION: ECOG PERFORMANCE STATUS: 1 - Symptomatic but completely ambulatory  Vitals:   05/15/23 0828 05/15/23 0832  BP: (!) 181/91 (!) 160/74  Pulse: 98 91  Resp: 15   Temp: (!) 97.3 F (36.3 C)   SpO2: 98%    Filed Weights   05/15/23 0828  Weight: 277 lb (125.6 kg)    Physical Exam Constitutional:      General: She is not in acute distress.    Appearance: She is obese. She is not diaphoretic.  HENT:     Head: Normocephalic and atraumatic.     Mouth/Throat:     Pharynx: No oropharyngeal exudate.  Eyes:     General: No scleral icterus. Cardiovascular:     Rate and Rhythm: Normal rate and regular rhythm.  Pulmonary:     Effort: Pulmonary effort is normal. No respiratory distress.     Breath sounds: Normal breath sounds. No wheezing.  Abdominal:     General: There is no distension.     Palpations: Abdomen is soft.     Tenderness: There is no abdominal tenderness.  Musculoskeletal:         General: Normal range of motion.     Cervical back: Normal range of motion and neck supple.     Comments: S/p left BKA  Skin:    General: Skin is warm and dry.     Findings: No erythema.  Neurological:     Mental Status: She is alert and oriented to person, place, and time. Mental status is at baseline.     Cranial Nerves: No cranial nerve deficit.     Motor: No abnormal muscle tone.  Psychiatric:        Mood and Affect: Affect normal.      LABORATORY DATA:  I have reviewed the data as listed    Latest Ref Rng & Units 05/15/2023    8:05 AM 04/24/2023    7:58 AM 04/03/2023    7:56 AM  CBC  WBC 4.0 - 10.5 K/uL 6.6  5.5  6.2   Hemoglobin 12.0 - 15.0 g/dL 81.1  91.4  78.2   Hematocrit 36.0 - 46.0 % 30.3  31.0  31.3   Platelets 150 - 400 K/uL 119  160  254       Latest Ref Rng & Units 05/15/2023    8:05 AM 04/24/2023    7:58 AM 04/03/2023    7:56 AM  CMP  Glucose 70 - 99 mg/dL 956  213  086   BUN 6 - 20 mg/dL 28  28  23    Creatinine 0.44 - 1.00 mg/dL 5.78  4.69  6.29   Sodium 135 - 145 mmol/L 136  138  137   Potassium 3.5 - 5.1 mmol/L 4.2  3.8  4.1   Chloride 98 - 111 mmol/L 102  103  102   CO2 22 - 32 mmol/L 21  24  25    Calcium 8.9 - 10.3 mg/dL 8.7  8.9  9.3   Total Protein 6.5 - 8.1 g/dL 6.9  6.9  7.0   Total Bilirubin 0.0 - 1.2 mg/dL 0.4  0.4  0.4   Alkaline Phos 38 - 126 U/L 50  49  47   AST 15 - 41 U/L 26  23  19    ALT 0 - 44 U/L 32  23  24      RADIOGRAPHIC STUDIES: I have personally reviewed the radiological images as listed and  agreed with the findings in the report. No results found.

## 2023-05-15 NOTE — Telephone Encounter (Signed)
 Oral Oncology Patient Advocate Encounter   Received notification that prior authorization for Nancy [name] is required.   PA submitted on 05/15/2023 Key [id number] Status is pending     [name], CPhT Oncology Pharmacy Patient Advocate [facility] Direct Number: [phone] Fax: [phone]

## 2023-05-15 NOTE — Patient Instructions (Signed)
 [facility] - A DEPT OF [facility]  Discharge Instructions: Thank you for choosing [facility] to provide your oncology and hematology care.  If you have a lab appointment with the Cancer Center, please go directly to the Cancer Center and check in at the registration area.  Wear comfortable clothing and clothing appropriate for easy access to any Portacath or PICC line.   We strive to give you quality time with your provider. You may need to reschedule your appointment if you arrive late (15 or more minutes).  Arriving late affects you and other patients whose appointments are after yours.  Also, if you miss three or more appointments without notifying the office, you may be dismissed from the clinic at the provider's discretion.      For prescription refill requests, have your pharmacy contact our office and allow 72 hours for refills to be completed.    Today you received the following chemotherapy and/or immunotherapy agents Taxol and Carboplatin       To help prevent nausea and vomiting after your treatment, we encourage you to take your nausea medication as directed.  BELOW ARE SYMPTOMS THAT SHOULD BE REPORTED IMMEDIATELY: *FEVER GREATER THAN 100.4 F (38 C) OR HIGHER *CHILLS OR SWEATING *NAUSEA AND VOMITING THAT IS NOT CONTROLLED WITH YOUR NAUSEA MEDICATION *UNUSUAL SHORTNESS OF BREATH *UNUSUAL BRUISING OR BLEEDING *URINARY PROBLEMS (pain or burning when urinating, or frequent urination) *BOWEL PROBLEMS (unusual diarrhea, constipation, pain near the anus) TENDERNESS IN MOUTH AND THROAT WITH OR WITHOUT PRESENCE OF ULCERS (sore throat, sores in mouth, or a toothache) UNUSUAL RASH, SWELLING OR PAIN  UNUSUAL VAGINAL DISCHARGE OR ITCHING   Items with * indicate a potential emergency and should be followed up as soon as possible or go to the Emergency Department if any problems should occur.  Please show the CHEMOTHERAPY ALERT CARD or  IMMUNOTHERAPY ALERT CARD at check-in to the Emergency Department and triage nurse.  Should you have questions after your visit or need to cancel or reschedule your appointment, please contact [facility] - A DEPT OF [facility]  [phone] and follow the prompts.  Office hours are 8:00 a.m. to 4:30 p.m. Monday - Friday. Please note that voicemails left after 4:00 p.m. may not be returned until the following business day.  We are closed weekends and major holidays. You have access to a nurse at all times for urgent questions. Please call the main number to the clinic [phone] and follow the prompts.  For any non-urgent questions, you may also contact your provider using MyChart. We now offer e-Visits for anyone 72 and older to request care online for non-urgent symptoms. For details visit [email or web address].   Also download the MyChart app! Go to the app store, search "MyChart", open the app, select [place], and log in with your MyChart username and password.

## 2023-05-15 NOTE — Telephone Encounter (Addendum)
 Clinical Pharmacist Practitioner Encounter   Received new prescription for Lynparza (olaparib) for the maintenance treatment of primary high grade serous adenocarcinoma of ovary, HRD positive, planned duration until disease progression or unacceptable drug toxicity. Planned start 06/06/23.   CMP/CBC from 05/15/23 assessed, no relevant lab abnormalities. Prescription dose and frequency assessed.   Current medication list in Epic reviewed, one DDIs with olaparib identified: Fluconazole : fluconazole  may increase serum concentration of olaparib. The recommendation is to avoid combination, but if this can't be avoided, the dose of olaparib should be reduced to 150 mg twice daily Patient reports that she takes the fluconazole  as need for yeast flare up in her skin folds. She has currently not need it for a few months.   Evaluated chart and no patient barriers to medication adherence identified.   Prescription has been e-scribed to the [facility] for benefits analysis and approval.  Oral Oncology Clinic will continue to follow for insurance authorization, copayment issues, initial counseling and start date.   Quamere Mussell N. Yonah Tangeman, PharmD, BCOP, CPP Hematology/Oncology Clinical Pharmacist [facility] Oral Chemotherapy Navigation Clinic [phone]  05/15/2023 9:51 AM

## 2023-05-15 NOTE — Assessment & Plan Note (Signed)
 Continue home antiemetics.

## 2023-05-16 ENCOUNTER — Encounter: Payer: Self-pay | Admitting: Oncology

## 2023-05-16 LAB — CA 125: Cancer Antigen (CA) 125: 8.7 U/mL (ref 0.0–38.1)

## 2023-05-16 NOTE — Telephone Encounter (Signed)
 Oral Oncology Patient Advocate Encounter  Received notification that the request for prior authorization for Nancy [name] has been denied due to "plan only covers this drug if you have been treated with bevacizumab (Avastin) during primary therapy." Please see indexed document for detailed information.    [name], CPhT Oncology Pharmacy Patient Advocate [facility] Direct Number: [phone] Fax: [phone]

## 2023-05-20 NOTE — Telephone Encounter (Signed)
 Expedited Appeal faxed to insurance on 05/20/23

## 2023-05-21 ENCOUNTER — Other Ambulatory Visit (HOSPITAL_COMMUNITY): Payer: Self-pay

## 2023-05-21 ENCOUNTER — Telehealth: Payer: Self-pay | Admitting: Pharmacy Technician

## 2023-05-21 NOTE — Telephone Encounter (Signed)
 Oral Oncology Patient Advocate Encounter   An urgent appeal for the prior authorization denial of [name] has been started by the pharmacist on 05/20/2023.   This encounter will continue to be updated until final appeal determination.      [name], CPhT Oncology Pharmacy Patient Advocate [facility] Direct Number: [phone] Fax: [phone]

## 2023-05-21 NOTE — Telephone Encounter (Signed)
 Oral Oncology Patient Advocate Encounter  Prior Authorization appeal for Nancy [name] has been approved.    PA# [id number] Effective dates: 05/21/2023 through 11/21/2023  [facility].    [name], CPhT Oncology Pharmacy Patient Advocate [facility] Direct Number: [phone] Fax: [phone]

## 2023-05-27 ENCOUNTER — Other Ambulatory Visit (INDEPENDENT_AMBULATORY_CARE_PROVIDER_SITE_OTHER): Payer: Self-pay | Admitting: Vascular Surgery

## 2023-05-27 ENCOUNTER — Encounter (INDEPENDENT_AMBULATORY_CARE_PROVIDER_SITE_OTHER): Payer: Self-pay

## 2023-05-28 ENCOUNTER — Telehealth: Payer: Self-pay | Admitting: Pharmacist

## 2023-05-28 ENCOUNTER — Other Ambulatory Visit: Payer: Self-pay | Admitting: Family

## 2023-05-28 DIAGNOSIS — C569 Malignant neoplasm of unspecified ovary: Secondary | ICD-10-CM

## 2023-05-28 MED ORDER — OLAPARIB 150 MG PO TABS: 300.0000 mg | ORAL_TABLET | Freq: Two times a day (BID) | ORAL | 1 refills | Status: DC

## 2023-05-28 NOTE — Telephone Encounter (Signed)
 Clinical Pharmacist Practitioner Encounter   Patient Education I spoke with patient and her wife for overview of new oral chemotherapy medication: Lynparza (olaparib) for the maintenance treatment of primary high grade serous adenocarcinoma of ovary, HRD positive, planned duration until disease progression or unacceptable drug toxicity. Planned start 06/06/23.   Counseled patient on administration, dosing, side effects, monitoring, drug-food interactions, safe handling, storage, and disposal. Patient will take 2 tablets (300 mg total) by mouth 2 (two) times daily. Swallow whole. May take with food to decrease nausea and vomiting.   Side effects include but not limited to: diarrhea, nausea, fatigue, and decreased hgb.  Diarrhea: patient knows to use loperamide as needed and call the office if they are having four or more loose stool per day Nausea: patient has antiemetics handy, she will take these as needed but knows she can take them 30-60 mins prior to olaparib   Discussed fluconazole  interaction with Ms. Gonsalves, she only takes fluconazole  as needed for yeast flare up and will stop taking due to the interaction.   Reviewed with patient importance of keeping a medication schedule and plan for any missed doses.  After discussion with patient no patient barriers to medication adherence identified.   Ms. Atilano voiced understanding and appreciation. All questions answered. Medication handout provided.  Provided patient with Oral Chemotherapy Navigation Clinic phone number. Patient knows to call the office with questions or concerns. Oral Chemotherapy Navigation Clinic will continue to follow.  Mirabelle Cyphers N. Lupie Sawa, PharmD, BCOP, CPP Hematology/Oncology Clinical Pharmacist [facility] Oral Chemotherapy Navigation Clinic [phone]  05/28/2023 12:21 PM

## 2023-05-29 ENCOUNTER — Encounter: Payer: Self-pay | Admitting: Oncology

## 2023-05-29 NOTE — Telephone Encounter (Signed)
 Message gas been sent to the patient

## 2023-05-30 DIAGNOSIS — S88112A Complete traumatic amputation at level between knee and ankle, left lower leg, initial encounter: Secondary | ICD-10-CM | POA: Diagnosis not present

## 2023-06-02 ENCOUNTER — Other Ambulatory Visit: Payer: Self-pay | Admitting: Family

## 2023-06-04 DIAGNOSIS — I70219 Atherosclerosis of native arteries of extremities with intermittent claudication, unspecified extremity: Secondary | ICD-10-CM | POA: Insufficient documentation

## 2023-06-04 NOTE — Progress Notes (Deleted)
 MRN : [id number]  Nancy [name] is a 58 y.o. (July 26, 1965) female who presents with chief complaint of check circulation.  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  She is complaining of some phantom pain but overall seems to be doing very well seem very motivated.  No outpatient medications have been marked as taking for the 06/05/23 encounter (Appointment) with Prescilla Brod, Ninette Basque, MD.    Past Medical History:  Diagnosis Date   Abnormal cardiovascular stress test 09/21/2018   Formatting of this note might be different from the original. Lexiscan Myoview 09/16/2018 revealed mild anterior ischemia   Adverse effect of motion 05/10/2014   AKI (acute kidney injury) (HCC) 10/22/2022   Anxiety    Arthritis    r knee   Asthma    Breast cyst 05/10/2014   CAD (coronary artery disease)    a.) LHC 06/04/2009 at [facility]; non-obstructive CAD. b.) CTA with FFR 10/08/2018: extensive mixed plaque proximal to mid LAD (51-69%); Coronary Ca score 217; FFR 0.71 dPDA, 0.86 mLAD, 0.87 dLCx.   CCF (congestive cardiac failure) (HCC) 06/06/2008   a.) 30% EF. b.) TTE 06/03/2011: EF >55%; triv MR. c.) TTE 04/27/2018: EF 55%; mild LVH; triv PR, mild MR/TR; G1DD.   Cellulitis of left lower extremity 09/18/2022   Chest pain with high risk for cardiac etiology 07/02/2016   Chronic use of opiate drug for therapeutic purpose 02/13/2022   Formatting of this note might be different from the original. [facility] Pain Contract signed on 04/18/16 & updated 03/04/17; UDS done on 04/18/16.   Complication of anesthesia    Diabetic foot infection (HCC) 03/13/2015   Diabetic foot ulcer (HCC) 02/08/2021   Diabetic ulcer of left heel associated with type 2 diabetes mellitus (HCC) 02/08/2021   Eczema    Family history of adverse  reaction to anesthesia    a.) PONV in mother and grandmother   Gas gangrene (HCC) 09/18/2022   GERD (gastroesophageal reflux disease) 05/10/2014   History of kidney stones    History of MSSA bacteremia due to osteomyelitis L foot 09/18/22 10/22/2022   HLD (hyperlipidemia)    Hyponatremia 03/13/2015   Migraines    Motion sickness    MSSA bacteremia 09/20/2022   Osteomyelitis (HCC) 09/18/2022   Other acute osteomyelitis, left ankle and foot (HCC) 02/13/2022   Panic attacks    Peripheral edema 04/27/2018   Pneumonia    PONV (postoperative nausea and vomiting)    Sepsis secondary to diabetic foot infection 03/13/2015   Sepsis secondary to diabetic foot infection 03/13/2015   T2DM (type 2 diabetes mellitus) (HCC)    Takotsubo cardiomyopathy / transient apical balooning syndrome / stress-induced cardiomyopathy 05/10/2014   Unspecified essential hypertension     Past Surgical History:  Procedure Laterality Date   AMPUTATION Left 09/19/2022   Procedure: AMPUTATION BELOW KNEE;  Surgeon: Jackquelyn Mass, MD;  Location: [facility];  Service: Vascular;  Laterality: Left;   AMPUTATION Left 09/24/2022  Procedure: AMPUTATION BELOW KNEE WITH WOUND CLOSURE;  Surgeon: Jackquelyn Mass, MD;  Location: [facility];  Service: Vascular;  Laterality: Left;   AMPUTATION TOE Left 03/16/2015   Procedure: left fifth toe amputation with incision and drainage;  Surgeon: Anell Baptist, DPM;  Location: [facility];  Service: Podiatry;  Laterality: Left;   APPLICATION OF WOUND VAC Left 02/14/2021   Procedure: APPLICATION OF WOUND VAC;  Surgeon: Anell Baptist, DPM;  Location: [facility];  Service: Podiatry;  Laterality: Left;   APPLICATION OF WOUND VAC Left 04/16/2021   Procedure: APPLICATION OF WOUND VAC;  Surgeon: Thornell Flirt, DO;  Location: [facility];  Service: Plastics;  Laterality: Left;   CARDIAC CATHETERIZATION Left 06/04/2009   Procedure: CARDIAC CATHETERIZATION; Location: [facility]   CHOLECYSTECTOMY     DEBRIDEMENT AND CLOSURE WOUND Left 04/16/2021   Procedure: DEBRIDEMENT AND CLOSURE WOUND;  Surgeon: Thornell Flirt, DO;  Location: [facility];  Service: Plastics;  Laterality: Left;  1 hour   IR IMAGING GUIDED PORT INSERTION  11/18/2022   IRRIGATION AND DEBRIDEMENT FOOT Left 02/08/2021   Procedure: IRRIGATION AND DEBRIDEMENT FOOT - LFT HEEL ULCER;  Surgeon: Anell Baptist, DPM;  Location: [facility];  Service: Podiatry;  Laterality: Left;   IRRIGATION AND DEBRIDEMENT FOOT Left 02/14/2021   Procedure: IRRIGATION AND DEBRIDEMENT LEFT HEEL;  Surgeon: Anell Baptist, DPM;  Location: [facility];  Service: Podiatry;  Laterality: Left;   kidney stone removal     KNEE ARTHROSCOPY W/ MENISCAL REPAIR     NOSE SURGERY  01/07/1989   due to fracture   TEE WITHOUT CARDIOVERSION N/A 09/26/2022   Procedure: TRANSESOPHAGEAL ECHOCARDIOGRAM;  Surgeon: Isabell Manzanilla, DO;  Location: [facility];  Service: Cardiovascular;  Laterality: N/A;   TOE AMPUTATION     second toe   TONSILLECTOMY     TOTAL KNEE ARTHROPLASTY  01/11/2011   Procedure: TOTAL KNEE ARTHROPLASTY;  Surgeon: Boston Byers;  Location: [facility];  Service: Orthopedics;  Laterality: Right;  COMPUTER ASSISTED TOTAL KNEE REPLACEMENT    Social History Social History   Tobacco Use   Smoking status: Former    Current packs/day: 0.00    Types: Cigarettes    Quit date: 03/12/1985    Years since quitting: 38.2   Smokeless tobacco: Never  Vaping Use   Vaping status: Never Used  Substance Use Topics   Alcohol use: No   Drug use: No    Family History Family History  Problem Relation Age of Onset   Transient ischemic attack Mother    Asthma Mother    Hypertension Mother    Lung cancer Father 45   Hypertension Father    Asthma Sister    Anxiety disorder Sister    Asthma Sister    Hypertension Brother    Ovarian cancer Maternal Aunt 40   Melanoma Maternal Uncle        dx 66s   Lung cancer  Paternal Aunt 52   Stomach cancer Maternal Grandmother    Colon cancer Maternal Grandmother    Lymphoma Maternal Grandmother    Melanoma Maternal Grandfather 72       sun exposure   Lung cancer Paternal Grandmother 29   Kidney cancer Paternal Grandfather        dx early   Crohn's disease Other    GER disease Other    Breast cancer Neg Hx     Allergies  Allergen Reactions   Shellfish Allergy Anaphylaxis  Codeine Other (See Comments)    Migraine  Other Reaction(s): Other (See Comments)  Reaction:  Severe migraines   Morphine  And Codeine Other (See Comments)    Reaction:  Severe migraines    Rosuvastatin     Other reaction(s): Muscle Pain   Percocet [Oxycodone -Acetaminophen ] Nausea And Vomiting   Sulfa Antibiotics Rash   Theophyllines Itching and Rash     REVIEW OF SYSTEMS (Negative unless checked)  Constitutional: [] Weight loss  [] Fever  [] Chills Cardiac: [] Chest pain   [] Chest pressure   [] Palpitations   [] Shortness of breath when laying flat   [] Shortness of breath with exertion. Vascular:  [x] Pain in legs with walking   [] Pain in legs at rest  [] History of DVT   [] Phlebitis   [] Swelling in legs   [] Varicose veins   [] Non-healing ulcers Pulmonary:   [] Uses home oxygen    [] Productive cough   [] Hemoptysis   [] Wheeze  [] COPD   [] Asthma Neurologic:  [] Dizziness   [] Seizures   [] History of stroke   [] History of TIA  [] Aphasia   [] Vissual changes   [] Weakness or numbness in arm   [] Weakness or numbness in leg Musculoskeletal:   [] Joint swelling   [] Joint pain   [] Low back pain Hematologic:  [] Easy bruising  [] Easy bleeding   [] Hypercoagulable state   [] Anemic Gastrointestinal:  [] Diarrhea   [] Vomiting  [] Gastroesophageal reflux/heartburn   [] Difficulty swallowing. Genitourinary:  [] Chronic kidney disease   [] Difficult urination  [] Frequent urination   [] Blood in urine Skin:  [] Rashes   [] Ulcers  Psychological:  [] History of anxiety   []  History of major depression.  Physical  Examination  There were no vitals filed for this visit. There is no height or weight on file to calculate BMI. Gen: WD/WN, NAD Head: [place]/AT, No temporalis wasting.  Ear/Nose/Throat: Hearing grossly intact, nares w/o erythema or drainage Eyes: PER, EOMI, sclera nonicteric.  Neck: Supple, no masses.  No bruit or JVD.  Pulmonary:  Good air movement, no audible wheezing, no use of accessory muscles.  Cardiac: RRR, normal S1, S2, no Murmurs. Vascular:  mild trophic changes, no open wounds Vessel Right Left  Radial Palpable Palpable  PT Not Palpable Not Palpable  DP Not Palpable Not Palpable  Gastrointestinal: soft, non-distended. No guarding/no peritoneal signs.  Musculoskeletal: M/S 5/5 throughout.  No visible deformity.  Neurologic: CN 2-12 intact. Pain and light touch intact in extremities.  Symmetrical.  Speech is fluent. Motor exam as listed above. Psychiatric: Judgment intact, Mood & affect appropriate for pt's clinical situation. Dermatologic: No rashes or ulcers noted.  No changes consistent with cellulitis.   CBC Lab Results  Component Value Date   WBC 6.6 05/15/2023   HGB 10.2 (L) 05/15/2023   HCT 30.3 (L) 05/15/2023   MCV 103.8 (H) 05/15/2023   PLT 119 (L) 05/15/2023    BMET    Component Value Date/Time   NA 136 05/15/2023 0805   NA 132 (L) 08/07/2022 1210   NA 133 (L) 08/21/2012 1523   K 4.2 05/15/2023 0805   K 4.1 08/21/2012 1523   CL 102 05/15/2023 0805   CL 102 08/21/2012 1523   CO2 21 (L) 05/15/2023 0805   CO2 26 08/21/2012 1523   GLUCOSE 235 (H) 05/15/2023 0805   GLUCOSE 306 (H) 08/21/2012 1523   BUN 28 (H) 05/15/2023 0805   BUN 23 08/07/2022 1210   BUN 17 08/21/2012 1523   CREATININE 1.02 (H) 05/15/2023 0805   CREATININE 0.69 08/21/2012 1523   CALCIUM 8.7 (L)  05/15/2023 0805   CALCIUM 8.8 08/21/2012 1523   GFRNONAA >60 05/15/2023 0805   GFRNONAA >60 08/21/2012 1523   GFRAA 117 11/04/2016 1109   GFRAA >60 08/21/2012 1523   CrCl cannot be  calculated (Unknown ideal weight.).  COAG Lab Results  Component Value Date   INR 1.2 11/18/2022   INR 1.5 (H) 09/18/2022   INR 1.2 02/07/2021    Radiology No results found.   Assessment/Plan There are no diagnoses linked to this encounter.   Devon Fogo, MD  06/04/2023 7:48 AM

## 2023-06-04 NOTE — Telephone Encounter (Signed)
 Nancy [name] checked [facility] and was able to see that medication was delivered to the patient on 06/03/23.

## 2023-06-05 ENCOUNTER — Ambulatory Visit (INDEPENDENT_AMBULATORY_CARE_PROVIDER_SITE_OTHER): Payer: 59 | Admitting: Vascular Surgery

## 2023-06-05 DIAGNOSIS — E782 Mixed hyperlipidemia: Secondary | ICD-10-CM

## 2023-06-05 DIAGNOSIS — I25119 Atherosclerotic heart disease of native coronary artery with unspecified angina pectoris: Secondary | ICD-10-CM

## 2023-06-05 DIAGNOSIS — I10 Essential (primary) hypertension: Secondary | ICD-10-CM

## 2023-06-05 DIAGNOSIS — E1165 Type 2 diabetes mellitus with hyperglycemia: Secondary | ICD-10-CM

## 2023-06-05 DIAGNOSIS — Z89512 Acquired absence of left leg below knee: Secondary | ICD-10-CM

## 2023-06-05 DIAGNOSIS — I70219 Atherosclerosis of native arteries of extremities with intermittent claudication, unspecified extremity: Secondary | ICD-10-CM

## 2023-06-06 ENCOUNTER — Encounter: Payer: Self-pay | Admitting: Oncology

## 2023-06-06 ENCOUNTER — Inpatient Hospital Stay

## 2023-06-06 ENCOUNTER — Inpatient Hospital Stay (HOSPITAL_BASED_OUTPATIENT_CLINIC_OR_DEPARTMENT_OTHER): Admitting: Oncology

## 2023-06-06 VITALS — BP 156/72 | HR 83 | Temp 97.1°F | Resp 18 | Wt 281.2 lb

## 2023-06-06 DIAGNOSIS — C569 Malignant neoplasm of unspecified ovary: Secondary | ICD-10-CM | POA: Diagnosis not present

## 2023-06-06 DIAGNOSIS — D508 Other iron deficiency anemias: Secondary | ICD-10-CM | POA: Diagnosis not present

## 2023-06-06 DIAGNOSIS — Z5111 Encounter for antineoplastic chemotherapy: Secondary | ICD-10-CM | POA: Diagnosis not present

## 2023-06-06 DIAGNOSIS — T451X5A Adverse effect of antineoplastic and immunosuppressive drugs, initial encounter: Secondary | ICD-10-CM

## 2023-06-06 DIAGNOSIS — G629 Polyneuropathy, unspecified: Secondary | ICD-10-CM | POA: Diagnosis not present

## 2023-06-06 DIAGNOSIS — C563 Malignant neoplasm of bilateral ovaries: Secondary | ICD-10-CM | POA: Diagnosis not present

## 2023-06-06 DIAGNOSIS — C786 Secondary malignant neoplasm of retroperitoneum and peritoneum: Secondary | ICD-10-CM | POA: Diagnosis not present

## 2023-06-06 DIAGNOSIS — Z79630 Long term (current) use of alkylating agent: Secondary | ICD-10-CM | POA: Diagnosis not present

## 2023-06-06 DIAGNOSIS — R110 Nausea: Secondary | ICD-10-CM | POA: Diagnosis not present

## 2023-06-06 DIAGNOSIS — Z95828 Presence of other vascular implants and grafts: Secondary | ICD-10-CM

## 2023-06-06 LAB — CBC WITH DIFFERENTIAL (CANCER CENTER ONLY)
Abs Immature Granulocytes: 0.08 10*3/uL — ABNORMAL HIGH (ref 0.00–0.07)
Basophils Absolute: 0.1 10*3/uL (ref 0.0–0.1)
Basophils Relative: 1 %
Eosinophils Absolute: 0 10*3/uL (ref 0.0–0.5)
Eosinophils Relative: 1 %
HCT: 28.5 % — ABNORMAL LOW (ref 36.0–46.0)
Hemoglobin: 9.5 g/dL — ABNORMAL LOW (ref 12.0–15.0)
Immature Granulocytes: 1 %
Lymphocytes Relative: 42 %
Lymphs Abs: 2.4 10*3/uL (ref 0.7–4.0)
MCH: 34.3 pg — ABNORMAL HIGH (ref 26.0–34.0)
MCHC: 33.3 g/dL (ref 30.0–36.0)
MCV: 102.9 fL — ABNORMAL HIGH (ref 80.0–100.0)
Monocytes Absolute: 0.7 10*3/uL (ref 0.1–1.0)
Monocytes Relative: 12 %
Neutro Abs: 2.5 10*3/uL (ref 1.7–7.7)
Neutrophils Relative %: 43 %
Platelet Count: 142 10*3/uL — ABNORMAL LOW (ref 150–400)
RBC: 2.77 MIL/uL — ABNORMAL LOW (ref 3.87–5.11)
RDW: 16.4 % — ABNORMAL HIGH (ref 11.5–15.5)
WBC Count: 5.8 10*3/uL (ref 4.0–10.5)
nRBC: 0 % (ref 0.0–0.2)

## 2023-06-06 LAB — CMP (CANCER CENTER ONLY)
ALT: 30 U/L (ref 0–44)
AST: 26 U/L (ref 15–41)
Albumin: 3.5 g/dL (ref 3.5–5.0)
Alkaline Phosphatase: 42 U/L (ref 38–126)
Anion gap: 9 (ref 5–15)
BUN: 30 mg/dL — ABNORMAL HIGH (ref 6–20)
CO2: 22 mmol/L (ref 22–32)
Calcium: 8.7 mg/dL — ABNORMAL LOW (ref 8.9–10.3)
Chloride: 104 mmol/L (ref 98–111)
Creatinine: 1.1 mg/dL — ABNORMAL HIGH (ref 0.44–1.00)
GFR, Estimated: 58 mL/min — ABNORMAL LOW (ref >60)
Glucose, Bld: 220 mg/dL — ABNORMAL HIGH (ref 70–99)
Potassium: 4.5 mmol/L (ref 3.5–5.1)
Sodium: 135 mmol/L (ref 135–145)
Total Bilirubin: 0.3 mg/dL (ref 0.0–1.2)
Total Protein: 6.8 g/dL (ref 6.5–8.1)

## 2023-06-06 LAB — RETIC PANEL
Immature Retic Fract: 32.7 % — ABNORMAL HIGH (ref 2.3–15.9)
RBC.: 2.83 MIL/uL — ABNORMAL LOW (ref 3.87–5.11)
Retic Count, Absolute: 111.8 10*3/uL (ref 19.0–186.0)
Retic Ct Pct: 4 % — ABNORMAL HIGH (ref 0.4–3.1)
Reticulocyte Hemoglobin: 35.7 pg (ref >27.9)

## 2023-06-06 LAB — FERRITIN: Ferritin: 84 ng/mL (ref 11–307)

## 2023-06-06 LAB — IRON AND TIBC
Iron: 89 ug/dL (ref 28–170)
Saturation Ratios: 21 % (ref 10.4–31.8)
TIBC: 420 ug/dL (ref 250–450)
UIBC: 331 ug/dL

## 2023-06-06 MED ORDER — HEPARIN SOD (PORK) LOCK FLUSH 100 UNIT/ML IV SOLN
500.0000 [IU] | Freq: Once | INTRAVENOUS | Status: AC
  Administered 2023-06-06: 500 [IU] via INTRAVENOUS
  Filled 2023-06-06: qty 5

## 2023-06-06 MED ORDER — SODIUM CHLORIDE 0.9% FLUSH
10.0000 mL | Freq: Once | INTRAVENOUS | Status: AC
  Administered 2023-06-06: 10 mL via INTRAVENOUS
  Filled 2023-06-06: qty 10

## 2023-06-06 NOTE — Assessment & Plan Note (Signed)
 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  Grade 1/ intermittent neuropathy of fingertips and toes.  Continue Gabapentin 300mg  PRN

## 2023-06-06 NOTE — Assessment & Plan Note (Addendum)
 Lab Results  Component Value Date   HGB 9.5 (L) 06/06/2023   TIBC 367 03/13/2023   IRONPCTSAT 15 03/13/2023   FERRITIN 57 03/13/2023    Hb is slightly lower than her baseline.

## 2023-06-06 NOTE — Assessment & Plan Note (Addendum)
 Continue home antiemetics PRN.

## 2023-06-06 NOTE — Assessment & Plan Note (Addendum)
 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 Post treatment CT showed excellent response. S/p Debulking R1 on 02/20/2023, s/p 2 cycles of adjuvant carboplatin  taxol  Repeat CT results were reviewed with patient- minimal residual peritoneal thickening.  Discussed case with Gynonc Dr. Marella Shams, recommend additional 2 cycles followed by Olaparib  maintenance.  Labs are reviewed and discussed with patient. Start  Olaparib  300mg  BID maintenance, given that her Hb is lower than her baseline, I recommend her to take 1 week break and start around 06/13/23 I will see her 2 weeks after she starts treatment.  Rationale and side effects were reviewed with patient.   K[place] score 3  discontinued Eliquis  2.5mg  BID , continue Aspirin  81mg  daily.  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

## 2023-06-06 NOTE — Assessment & Plan Note (Signed)
 Olaparib treatment as planned.

## 2023-06-06 NOTE — Progress Notes (Signed)
 Hematology/Oncology Progress note [phone] [phone]      CHIEF COMPLAINTS  High grade serous carcinoma of ovaries.   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 Post treatment CT showed excellent response. S/p Debulking R1 on 02/20/2023, s/p 2 cycles of adjuvant carboplatin  taxol  Repeat CT results were reviewed with patient- minimal residual peritoneal thickening.  Discussed case with Gynonc Dr. Marella Shams, recommend additional 2 cycles followed by Olaparib maintenance.  Labs are reviewed and discussed with patient. Start  Olaparib 300mg  BID maintenance, given that her Hb is lower than her baseline, I recommend her to take 1 week break and start around 06/13/23 I will see her 2 weeks after she starts treatment.  Rationale and side effects were reviewed with patient.   Khorana score 3  discontinued Eliquis  2.5mg  BID , continue Aspirin  81mg  daily.  Genetic evaluation - BRIP1 mutation  Encounter for antineoplastic chemotherapy Olaparib treatment as planned.   IDA (iron  deficiency anemia) Lab Results  Component Value Date   HGB 9.5 (L) 06/06/2023   TIBC 367 03/13/2023   IRONPCTSAT 15 03/13/2023   FERRITIN 57 03/13/2023    Hb is slightly lower than her baseline.   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  Grade 1/ intermittent neuropathy of fingertips and toes.  Continue Gabapentin  300mg  PRN  Chemotherapy-induced nausea Continue home antiemetics PRN.     Follow up 3 weeks   All questions were answered. The patient knows to call the clinic with any problems, questions or concerns.  Timmy Forbes, MD, PhD [facility] Hematology Oncology 06/06/2023    HISTORY OF PRESENTING ILLNESS:  Nancy [name] 58 y.o. female presents to  establish care for  peritoneal carcinomatosis I have reviewed her chart and materials related to her cancer extensively and collaborated history with the patient. 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 Aortic atherosclerosis    11/06/2022 Initial Diagnosis   Primary peritoneal carcinomatosis   -presented to emergency room for evaluation of abdominal pain/bloating, poor oral intake, nonbloody nonbilious vomiting  Patient underwent paracentesis, cytology showed malignant cells. IHC can not be added.    11/14/2022 Imaging   CT chest w contrast  1. Prominent subcentimeter right retrocrural lymph nodes, metastatic disease can not be excluded. 2. Nonspecific ground-glass nodule of the superior portion of the right lower lobe measuring 7 mm. Recommend attention on follow-up. 3. Small left pleural effusion. 4. Partially visualized large volume abdominal ascites and peritoneal thickening, volume of ascites appears increased when compared with the prior CT. 5. Coronary artery calcifications and aortic Atherosclerosis (ICD10-I70.0).   11/18/2022 Procedure   Medi port placement by IR   11/18/2022 Procedure   Omentum biopsy showed  1. Omentum, biopsy,  :   - INVOLVEMENT BY HIGH-GRADE SEROUS CARCINOMA OF GYNECOLOGIC ORIGIN.   Diagnosis Note : The carcinoma is positive for cytokeratin 7, PAX8, WT1, and ER.  Cytokeratin 20 and GATA3 are negative.  P53 is overexpressed.  The morphologic findings in conjunction with the pattern of immunohistochemistry support the  above diagnosis.   MyRIAD HRD positive  11/21/2022 -  Chemotherapy   Patient is on Treatment Plan : Carboplatin  + Paclitaxel  q21d      Genetic  Testing   Pathogenic variant in BRIP1 called  p.S624* (c.1871C>A) identified on the [employer] CancerNext-Expanded+RNA panel. The report date is 11/25/2022.  The CancerNext-Expanded + RNAinsight gene panel offered by [employer] and includes sequencing and rearrangement analysis for the following 71 genes: AIP, ALK, APC*, ATM*, AXIN2, BAP1, BARD1, BMPR1A, BRCA1*, BRCA2*, BRIP1*, CDC73, CDH1*,CDK4, CDKN1B, CDKN2A, CHEK2*, CTNNA1, DICER1, FH, FLCN, KIF1B, LZTR1, MAX, MEN1, MET, MLH1*, MSH2*, MSH3, MSH6*, MUTYH*, NF1*, NF2, NTHL1, PALB2*, PHOX2B, PMS2*, POT1, PRKAR1A, PTCH1, PTEN*, RAD51C*, RAD51D*,RB1, RET, SDHA, SDHAF2, SDHB, SDHC, SDHD, SMAD4, SMARCA4, SMARCB1, SMARCE1, STK11, SUFU, TMEM127, TP53*,TSC1, TSC2, VHL; EGFR, EGLN1, HOXB13, KIT, MITF, PDGFRA, POLD1 and POLE (sequencing only); EPCAM and GREM1 (deletion/duplication only).    01/13/2023 Imaging   CT chest abdomen pelvis w contrast showed  Decreasing ascites, trace residual. The areas of omental caking and nodularity along the perineum is decreasing as well with significant residual particularly in the upper anterior abdomen.   Stable small mesenteric and retroperitoneal nodes. Resolved left pleural effusion. No developing new mass lesion. Small left renal angiomyolipoma and upper pole nonobstructing renal stone.   02/20/2023 Surgery    Interval debulking R1 - robotic assisted laparoscopic total hysterectomy, bilateral salpingo-oophorectomy, lysis of adhesions, peritoneal stripping, and omentectomy with stage IIIC high grade serous carcinoma of bilateral ovaries   A. Uterus, bilateral fallopian tubes, and bilateral ovaries, total hysterectomy and bilateral salpingo-oophorectomy: Residual high grade serous carcinoma (1.6 cm) involving bilateral ovaries, left fallopian tube, and uterine serosa.   Additional findings:  Uterus with leiomyomata (up to 1.1 cm) Right fallopian tube with no pathologic diagnosis See synoptic report.   B.  Peritoneal nodules, excision: High grade serous carcinoma (up to 0.1 cm).  C. Omentum, omentectomy:  High grade serous carcinoma (0.7 cm). Moderate treatment response (CRS2).     03/04/2023 Cancer Staging   Staging form: Ovary, AJCC 7th Edition - Pathologic stage from 03/04/2023: FIGO Stage IIIC (cM0) - Signed by Timmy Forbes, MD on 03/13/2023 Stage prefix: Initial diagnosis Laterality: Bilateral   History of sepsis secondary to diabetic foot infection/osteomyelitis status post left BKA done on 09/24/2022,    Today she presents to for evaluation prior to chemotherapy. Accompanied by significant other.  + nausea, manageable with antiemetics.  Feels more tired after last chemotherapy No other new complaints.  Denies any abdominal pain, vomiting, diarrhea. Appetite is good.  Intermittent fingertip and toe numbness, she take gabapentin  PRN    MEDICAL HISTORY:  Past Medical History:  Diagnosis Date   Abnormal cardiovascular stress test 09/21/2018   Formatting of this note might be different from the original. Lexiscan Myoview 09/16/2018 revealed mild anterior ischemia   Adverse effect of motion 05/10/2014   AKI (acute kidney injury) (HCC) 10/22/2022   Anxiety    Arthritis    r knee   Asthma    Breast cyst 05/10/2014   CAD (coronary artery disease)    a.) LHC 06/04/2009 at [facility]; non-obstructive CAD. b.) CTA with FFR 10/08/2018: extensive mixed plaque proximal to mid LAD (51-69%); Coronary Ca score 217; FFR 0.71 dPDA, 0.86 mLAD, 0.87 dLCx.   CCF (congestive cardiac failure) (HCC) 06/06/2008   a.) 30% EF. b.) TTE 06/03/2011: EF >55%; triv MR. c.) TTE 04/27/2018: EF 55%; mild LVH; triv PR, mild MR/TR; G1DD.   Cellulitis of left lower extremity 09/18/2022   Chest pain with high risk for cardiac etiology  07/02/2016   Chronic use of opiate drug for therapeutic purpose 02/13/2022   Formatting of this note might be different from the original. [facility] Pain Contract signed on 04/18/16 & updated 03/04/17;  UDS done on 04/18/16.   Complication of anesthesia    Diabetic foot infection (HCC) 03/13/2015   Diabetic foot ulcer (HCC) 02/08/2021   Diabetic ulcer of left heel associated with type 2 diabetes mellitus (HCC) 02/08/2021   Eczema    Family history of adverse reaction to anesthesia    a.) PONV in mother and grandmother   Gas gangrene (HCC) 09/18/2022   GERD (gastroesophageal reflux disease) 05/10/2014   History of kidney stones    History of MSSA bacteremia due to osteomyelitis L foot 09/18/22 10/22/2022   HLD (hyperlipidemia)    Hyponatremia 03/13/2015   Migraines    Motion sickness    MSSA bacteremia 09/20/2022   Osteomyelitis (HCC) 09/18/2022   Other acute osteomyelitis, left ankle and foot (HCC) 02/13/2022   Panic attacks    Peripheral edema 04/27/2018   Pneumonia    PONV (postoperative nausea and vomiting)    Sepsis secondary to diabetic foot infection 03/13/2015   Sepsis secondary to diabetic foot infection 03/13/2015   T2DM (type 2 diabetes mellitus) (HCC)    Takotsubo cardiomyopathy / transient apical balooning syndrome / stress-induced cardiomyopathy 05/10/2014   Unspecified essential hypertension     SURGICAL HISTORY: Past Surgical History:  Procedure Laterality Date   AMPUTATION Left 09/19/2022   Procedure: AMPUTATION BELOW KNEE;  Surgeon: Jackquelyn Mass, MD;  Location: [facility];  Service: Vascular;  Laterality: Left;   AMPUTATION Left 09/24/2022   Procedure: AMPUTATION BELOW KNEE WITH WOUND CLOSURE;  Surgeon: Jackquelyn Mass, MD;  Location: [facility];  Service: Vascular;  Laterality: Left;   AMPUTATION TOE Left 03/16/2015   Procedure: left fifth toe amputation with incision and drainage;  Surgeon: Anell Baptist, DPM;  Location: [facility];  Service: Podiatry;  Laterality: Left;   APPLICATION OF WOUND VAC Left 02/14/2021   Procedure: APPLICATION OF WOUND VAC;  Surgeon: Anell Baptist, DPM;  Location: [facility];  Service: Podiatry;  Laterality: Left;   APPLICATION OF  WOUND VAC Left 04/16/2021   Procedure: APPLICATION OF WOUND VAC;  Surgeon: Thornell Flirt, DO;  Location: [facility];  Service: Plastics;  Laterality: Left;   CARDIAC CATHETERIZATION Left 06/04/2009   Procedure: CARDIAC CATHETERIZATION; Location: [facility]   CHOLECYSTECTOMY     DEBRIDEMENT AND CLOSURE WOUND Left 04/16/2021   Procedure: DEBRIDEMENT AND CLOSURE WOUND;  Surgeon: Thornell Flirt, DO;  Location: [facility];  Service: Plastics;  Laterality: Left;  1 hour   IR IMAGING GUIDED PORT INSERTION  11/18/2022   IRRIGATION AND DEBRIDEMENT FOOT Left 02/08/2021   Procedure: IRRIGATION AND DEBRIDEMENT FOOT - LFT HEEL ULCER;  Surgeon: Anell Baptist, DPM;  Location: [facility];  Service: Podiatry;  Laterality: Left;   IRRIGATION AND DEBRIDEMENT FOOT Left 02/14/2021   Procedure: IRRIGATION AND DEBRIDEMENT LEFT HEEL;  Surgeon: Anell Baptist, DPM;  Location: [facility];  Service: Podiatry;  Laterality: Left;   kidney stone removal     KNEE ARTHROSCOPY W/ MENISCAL REPAIR     NOSE SURGERY  01/07/1989   due to fracture   TEE WITHOUT CARDIOVERSION N/A 09/26/2022   Procedure: TRANSESOPHAGEAL ECHOCARDIOGRAM;  Surgeon: Isabell Manzanilla, DO;  Location: [facility];  Service: Cardiovascular;  Laterality: N/A;   TOE AMPUTATION     second toe   TONSILLECTOMY  TOTAL KNEE ARTHROPLASTY  01/11/2011   Procedure: TOTAL KNEE ARTHROPLASTY;  Surgeon: Boston Byers;  Location: [facility];  Service: Orthopedics;  Laterality: Right;  COMPUTER ASSISTED TOTAL KNEE REPLACEMENT    SOCIAL HISTORY: Social History   Socioeconomic History   Marital status: Married    Spouse name: Not on file   Number of children: Not on file   Years of education: Not on file   Highest education level: Not on file  Occupational History   Not on file  Tobacco Use   Smoking status: Former    Current packs/day: 0.00    Types: Cigarettes    Quit date: 03/12/1985    Years since quitting: 38.2    Smokeless tobacco: Never  Vaping Use   Vaping status: Never Used  Substance and Sexual Activity   Alcohol use: No   Drug use: No   Sexual activity: Yes    Birth control/protection: Post-menopausal  Other Topics Concern   Not on file  Social History Narrative   Wife, Bernardo Bridgeman, at bedside. One indoor pet, dog.   Social Drivers of Corporate investment banker Strain: Not on file  Food Insecurity: No Food Insecurity (10/22/2022)   Hunger Vital Sign    Worried About Running Out of Food in the Last Year: Never true    Ran Out of Food in the Last Year: Never true  Transportation Needs: No Transportation Needs (10/22/2022)   PRAPARE - Administrator, Civil Service (Medical): No    Lack of Transportation (Non-Medical): No  Physical Activity: Not on file  Stress: Not on file  Social Connections: Not on file  Intimate Partner Violence: Not At Risk (10/22/2022)   Humiliation, Afraid, Rape, and Kick questionnaire    Fear of Current or Ex-Partner: No    Emotionally Abused: No    Physically Abused: No    Sexually Abused: No    FAMILY HISTORY: Family History  Problem Relation Age of Onset   Transient ischemic attack Mother    Asthma Mother    Hypertension Mother    Lung cancer Father 74   Hypertension Father    Asthma Sister    Anxiety disorder Sister    Asthma Sister    Hypertension Brother    Ovarian cancer Maternal Aunt 41   Melanoma Maternal Uncle        dx 39s   Lung cancer Paternal Aunt 29   Stomach cancer Maternal Grandmother    Colon cancer Maternal Grandmother    Lymphoma Maternal Grandmother    Melanoma Maternal Grandfather 72       sun exposure   Lung cancer Paternal Grandmother 90   Kidney cancer Paternal Grandfather        dx early   Crohn's disease Other    GER disease Other    Breast cancer Neg Hx     ALLERGIES:  is allergic to shellfish allergy, codeine, morphine  and codeine, rosuvastatin, percocet [oxycodone -acetaminophen ], sulfa antibiotics,  and theophyllines.  MEDICATIONS:  Current Outpatient Medications  Medication Sig Dispense Refill   acidophilus (RISAQUAD) CAPS capsule Take 1 capsule by mouth daily.     ALPRAZolam  (XANAX ) 0.5 MG tablet TAKE 1/2 TO 1 TABLET BY MOUTH AS NEEDED FOR PANIC ATTACKS 30 tablet 2   ascorbic acid  (VITAMIN C ) 500 MG tablet Take 1 tablet (500 mg total) by mouth 2 (two) times daily. 60 tablet 0   aspirin  EC 81 MG tablet Take 81 mg by mouth daily.  carvedilol  (COREG ) 6.25 MG tablet TAKE 1 TABLET BY MOUTH TWICE A DAY 180 tablet 1   cholecalciferol (VITAMIN D3) 25 MCG (1000 UNIT) tablet Take 1,000 Units by mouth daily.     Cyanocobalamin  (VITAMIN B 12) 500 MCG TABS Take by mouth daily at 8 pm.     desvenlafaxine (PRISTIQ) 100 MG 24 hr tablet TAKE 1 TABLET BY MOUTH EVERY DAY 90 tablet 2   dicyclomine  (BENTYL ) 10 MG capsule Take 1 capsule (10 mg total) by mouth 4 (four) times daily -  before meals and at bedtime. 60 capsule 1   ELIQUIS  2.5 MG TABS tablet TAKE 1 TABLET BY MOUTH TWICE A DAY 60 tablet 2   Ensure Max Protein (ENSURE MAX PROTEIN) LIQD Take 330 mLs (11 oz total) by mouth 2 (two) times daily.     fenofibrate  54 MG tablet TAKE 1 TABLET BY MOUTH EVERY DAY 30 tablet 11   gabapentin  (NEURONTIN ) 300 MG capsule TAKE 1 CAPSULE (300MG  TOTAL) BY MOUTH AT BEDTIME AS NEEDED 30 capsule 3   insulin  NPH-regular Human (70-30) 100 UNIT/ML injection Inject 15 Units into the skin in the morning and at bedtime.     iron  polysaccharides (NIFEREX) 150 MG capsule Take 1 capsule (150 mg total) by mouth daily. 30 capsule 2   lisinopril  (ZESTRIL ) 10 MG tablet Take 1 tablet (10 mg total) by mouth every morning AND 0.5 tablets (5 mg total) at bedtime. 45 tablet 11   lisinopril  (ZESTRIL ) 5 MG tablet Take 1 tablet (5 mg total) by mouth 2 (two) times daily. 180 tablet 1   metFORMIN  (GLUCOPHAGE ) 500 MG tablet TAKE TWO TABLETS EACH MORNING. AND 3 TABLETS IN THE EVENING AS DIRECTED 150 tablet 5   methocarbamol  (ROBAXIN ) 500 MG  tablet Take 1 tablet (500 mg total) by mouth every 8 (eight) hours as needed for muscle spasms. 30 tablet 0   mirtazapine  (REMERON ) 7.5 MG tablet TAKE 1 TABLET BY MOUTH AT BEDTIME. 90 tablet 2   MOUNJARO 5 MG/0.5ML Pen SMARTSIG:5 Milligram(s) SUB-Q Once a Week     nystatin  (MYCOSTATIN /NYSTOP ) powder APPLY TOPICALLY TWICE DAILY AS NEEDED 30 g 3   nystatin  ointment (MYCOSTATIN ) Apply 1 Application topically 2 (two) times daily. 30 g 0   olaparib (LYNPARZA) 150 MG tablet Take 2 tablets (300 mg total) by mouth 2 (two) times daily. Swallow whole. May take with food to decrease nausea and vomiting. 120 tablet 1   ondansetron  (ZOFRAN ) 8 MG tablet Take 1 tablet (8 mg total) by mouth every 8 (eight) hours as needed for nausea or vomiting. Start on the third day after carboplatin . 30 tablet 1   ondansetron  (ZOFRAN -ODT) 8 MG disintegrating tablet Take 1 tablet (8 mg total) by mouth every 8 (eight) hours as needed for nausea or vomiting. 90 tablet 3   pantoprazole  (PROTONIX ) 40 MG tablet TAKE 1 TABLET BY MOUTH EVERY DAY 90 tablet 3   pravastatin  (PRAVACHOL ) 40 MG tablet TAKE 1 TABLET BY MOUTH EVERY DAY 30 tablet 5   prochlorperazine  (COMPAZINE ) 10 MG tablet Take 1 tablet (10 mg total) by mouth every 6 (six) hours as needed for nausea or vomiting. 30 tablet 1   UBRELVY  100 MG TABS TAKE 1 TABLET (100 MG TOTAL) BY MOUTH AS NEEDED (MIGRAINE). 12 tablet 1   zinc  sulfate 220 (50 Zn) MG capsule Take 1 capsule (220 mg total) by mouth daily. 30 capsule 0   albuterol  (PROVENTIL  HFA;VENTOLIN  HFA) 108 (90 Base) MCG/ACT inhaler Inhale 2 puffs into the lungs  every 4 (four) hours as needed for wheezing or shortness of breath. Reported on 03/29/2015 (Patient not taking: Reported on 04/03/2023) 1 Inhaler 3   albuterol  (PROVENTIL ) (2.5 MG/3ML) 0.083% nebulizer solution Take 2.5 mg by nebulization as needed. (Patient not taking: Reported on 04/03/2023)     Continuous Glucose Sensor (FREESTYLE LIBRE 2 SENSOR) MISC 1 each by Does not  apply route every 14 (fourteen) days. (Patient not taking: Reported on 06/06/2023) 2 each 3   dexamethasone  (DECADRON ) 4 MG tablet Take 2 tablets (8mg ) by mouth daily starting the day after carboplatin  for 2 days. Take with food (Patient not taking: Reported on 06/06/2023) 30 tablet 1   Dulaglutide  (TRULICITY ) 1.5 MG/0.5ML SOAJ INJECT 1.5 MG SUBCUTANEOUSLY ONCE A WEEK (Patient not taking: Reported on 06/06/2023) 2 mL 2   HYDROmorphone  (DILAUDID ) 2 MG tablet Take 1 tablet (2 mg total) by mouth every 4 (four) hours as needed for severe pain (pain score 7-10). (Patient not taking: Reported on 04/03/2023) 60 tablet 0   ibuprofen (ADVIL) 800 MG tablet TAKE 1 TABLET BY MOUTH EVERY 8 HOURS AS NEEDED (Patient not taking: Reported on 04/03/2023) 90 tablet 3   lidocaine -prilocaine  (EMLA ) cream Apply to affected area once (Patient not taking: Reported on 04/03/2023) 30 g 3   montelukast  (SINGULAIR ) 10 MG tablet TAKE 1 TABLET BY MOUTH EVERYDAY AT BEDTIME 30 tablet 5   Multiple Vitamin (MULTIVITAMIN WITH MINERALS) TABS tablet Take 1 tablet by mouth daily. 30 tablet 2   naloxone  (NARCAN ) nasal spray 4 mg/0.1 mL SPRAY 1 SPRAY INTO ONE NOSTRIL AS DIRECTED FOR OPIOID OVERDOSE (TURN PERSON ON SIDE AFTER DOSE. IF NO RESPONSE IN 2-3 MINUTES OR PERSON RESPONDS BUT RELAPSES, REPEAT USING A NEW SPRAY DEVICE AND SPRAY INTO THE OTHER NOSTRIL. CALL 911 AFTER USE.) * EMERGENCY USE ONLY * 1 each 0   No current facility-administered medications for this visit.    Review of Systems  Constitutional:  Negative for appetite change, chills, fatigue, fever and unexpected weight change.  HENT:   Negative for hearing loss and voice change.   Eyes:  Negative for eye problems.  Respiratory:  Negative for chest tightness, cough and shortness of breath.   Cardiovascular:  Negative for chest pain and leg swelling.  Gastrointestinal:  Negative for abdominal distention, abdominal pain and blood in stool.  Endocrine: Negative for hot flashes.   Genitourinary:  Negative for difficulty urinating and frequency.   Musculoskeletal:  Negative for arthralgias.       S/p left  BKA   Skin:  Negative for itching and rash.  Neurological:  Positive for numbness. Negative for extremity weakness.  Hematological:  Negative for adenopathy.  Psychiatric/Behavioral:  Negative for confusion.      PHYSICAL EXAMINATION: ECOG PERFORMANCE STATUS: 1 - Symptomatic but completely ambulatory  Vitals:   06/06/23 0849  BP: (!) 156/72  Pulse: 83  Resp: 18  Temp: (!) 97.1 F (36.2 C)  SpO2: 98%   Filed Weights   06/06/23 0849  Weight: 281 lb 3.2 oz (127.6 kg)    Physical Exam Constitutional:      General: She is not in acute distress.    Appearance: She is obese. She is not diaphoretic.  HENT:     Head: Normocephalic and atraumatic.     Mouth/Throat:     Pharynx: No oropharyngeal exudate.  Eyes:     General: No scleral icterus. Cardiovascular:     Rate and Rhythm: Normal rate and regular rhythm.  Pulmonary:  Effort: Pulmonary effort is normal. No respiratory distress.     Breath sounds: Normal breath sounds. No wheezing.  Abdominal:     General: There is no distension.     Palpations: Abdomen is soft.     Tenderness: There is no abdominal tenderness.  Musculoskeletal:        General: Normal range of motion.     Cervical back: Normal range of motion and neck supple.     Comments: S/p left BKA  Skin:    General: Skin is warm and dry.     Findings: No erythema.  Neurological:     Mental Status: She is alert and oriented to person, place, and time. Mental status is at baseline.     Cranial Nerves: No cranial nerve deficit.     Motor: No abnormal muscle tone.  Psychiatric:        Mood and Affect: Affect normal.      LABORATORY DATA:  I have reviewed the data as listed    Latest Ref Rng & Units 06/06/2023    7:56 AM 05/15/2023    8:05 AM 04/24/2023    7:58 AM  CBC  WBC 4.0 - 10.5 K/uL 5.8  6.6  5.5   Hemoglobin 12.0 -  15.0 g/dL 9.5  16.1  09.6   Hematocrit 36.0 - 46.0 % 28.5  30.3  31.0   Platelets 150 - 400 K/uL 142  119  160       Latest Ref Rng & Units 06/06/2023    7:56 AM 05/15/2023    8:05 AM 04/24/2023    7:58 AM  CMP  Glucose 70 - 99 mg/dL 045  409  811   BUN 6 - 20 mg/dL 30  28  28    Creatinine 0.44 - 1.00 mg/dL 9.14  7.82  9.56   Sodium 135 - 145 mmol/L 135  136  138   Potassium 3.5 - 5.1 mmol/L 4.5  4.2  3.8   Chloride 98 - 111 mmol/L 104  102  103   CO2 22 - 32 mmol/L 22  21  24    Calcium 8.9 - 10.3 mg/dL 8.7  8.7  8.9   Total Protein 6.5 - 8.1 g/dL 6.8  6.9  6.9   Total Bilirubin 0.0 - 1.2 mg/dL 0.3  0.4  0.4   Alkaline Phos 38 - 126 U/L 42  50  49   AST 15 - 41 U/L 26  26  23    ALT 0 - 44 U/L 30  32  23      RADIOGRAPHIC STUDIES: I have personally reviewed the radiological images as listed and agreed with the findings in the report. No results found.

## 2023-06-07 LAB — CA 125: Cancer Antigen (CA) 125: 8.1 U/mL (ref 0.0–38.1)

## 2023-06-16 ENCOUNTER — Ambulatory Visit (INDEPENDENT_AMBULATORY_CARE_PROVIDER_SITE_OTHER): Admitting: Vascular Surgery

## 2023-06-16 ENCOUNTER — Encounter (INDEPENDENT_AMBULATORY_CARE_PROVIDER_SITE_OTHER): Payer: Self-pay | Admitting: Vascular Surgery

## 2023-06-16 VITALS — BP 175/75 | HR 86 | Resp 18 | Ht 69.0 in | Wt 288.2 lb

## 2023-06-16 DIAGNOSIS — I70219 Atherosclerosis of native arteries of extremities with intermittent claudication, unspecified extremity: Secondary | ICD-10-CM | POA: Diagnosis not present

## 2023-06-16 DIAGNOSIS — E1165 Type 2 diabetes mellitus with hyperglycemia: Secondary | ICD-10-CM

## 2023-06-16 DIAGNOSIS — J45909 Unspecified asthma, uncomplicated: Secondary | ICD-10-CM | POA: Diagnosis not present

## 2023-06-16 DIAGNOSIS — Z89512 Acquired absence of left leg below knee: Secondary | ICD-10-CM | POA: Diagnosis not present

## 2023-06-16 DIAGNOSIS — Z794 Long term (current) use of insulin: Secondary | ICD-10-CM | POA: Diagnosis not present

## 2023-06-16 DIAGNOSIS — I25119 Atherosclerotic heart disease of native coronary artery with unspecified angina pectoris: Secondary | ICD-10-CM | POA: Diagnosis not present

## 2023-06-16 NOTE — Progress Notes (Unsigned)
 MRN : [id number]  IDELLE REIMANN is a 58 y.o. (Oct 30, 1965) female who presents with chief complaint of check circulation.  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  She is complaining of some phantom pain but overall seems to be doing very well seem very motivated.  Current Meds  Medication Sig   acidophilus (RISAQUAD) CAPS capsule Take 1 capsule by mouth daily.   albuterol  (PROVENTIL  HFA;VENTOLIN  HFA) 108 (90 Base) MCG/ACT inhaler Inhale 2 puffs into the lungs every 4 (four) hours as needed for wheezing or shortness of breath. Reported on 03/29/2015   albuterol  (PROVENTIL ) (2.5 MG/3ML) 0.083% nebulizer solution Take 2.5 mg by nebulization as needed.   ALPRAZolam  (XANAX ) 0.5 MG tablet TAKE 1/2 TO 1 TABLET BY MOUTH AS NEEDED FOR PANIC ATTACKS   ascorbic acid  (VITAMIN C ) 500 MG tablet Take 1 tablet (500 mg total) by mouth 2 (two) times daily.   aspirin  EC 81 MG tablet Take 81 mg by mouth daily.   carvedilol  (COREG ) 6.25 MG tablet TAKE 1 TABLET BY MOUTH TWICE A DAY   cholecalciferol (VITAMIN D3) 25 MCG (1000 UNIT) tablet Take 1,000 Units by mouth daily.   Continuous Glucose Sensor (FREESTYLE LIBRE 2 SENSOR) MISC 1 each by Does not apply route every 14 (fourteen) days.   Cyanocobalamin  (VITAMIN B 12) 500 MCG TABS Take by mouth daily at 8 pm.   desvenlafaxine (PRISTIQ) 100 MG 24 hr tablet TAKE 1 TABLET BY MOUTH EVERY DAY   dexamethasone  (DECADRON ) 4 MG tablet Take 2 tablets (8mg ) by mouth daily starting the day after carboplatin  for 2 days. Take with food   dicyclomine  (BENTYL ) 10 MG capsule Take 1 capsule (10 mg total) by mouth 4 (four) times daily -  before meals and at bedtime.   Dulaglutide  (TRULICITY ) 1.5 MG/0.5ML SOAJ INJECT 1.5 MG SUBCUTANEOUSLY ONCE A WEEK   ELIQUIS  2.5 MG TABS  tablet TAKE 1 TABLET BY MOUTH TWICE A DAY   Ensure Max Protein (ENSURE MAX PROTEIN) LIQD Take 330 mLs (11 oz total) by mouth 2 (two) times daily.   fenofibrate  54 MG tablet TAKE 1 TABLET BY MOUTH EVERY DAY   gabapentin  (NEURONTIN ) 300 MG capsule TAKE 1 CAPSULE (300MG  TOTAL) BY MOUTH AT BEDTIME AS NEEDED   HYDROmorphone  (DILAUDID ) 2 MG tablet Take 1 tablet (2 mg total) by mouth every 4 (four) hours as needed for severe pain (pain score 7-10).   ibuprofen (ADVIL) 800 MG tablet TAKE 1 TABLET BY MOUTH EVERY 8 HOURS AS NEEDED   insulin  NPH-regular Human (70-30) 100 UNIT/ML injection Inject 15 Units into the skin in the morning and at bedtime.   iron  polysaccharides (NIFEREX) 150 MG capsule Take 1 capsule (150 mg total) by mouth daily.   lidocaine -prilocaine  (EMLA ) cream Apply to affected area once   lisinopril  (ZESTRIL ) 10 MG tablet Take 1 tablet (10 mg total) by mouth every morning AND 0.5 tablets (5 mg total) at bedtime.   lisinopril  (ZESTRIL )  5 MG tablet Take 1 tablet (5 mg total) by mouth 2 (two) times daily.   metFORMIN  (GLUCOPHAGE ) 500 MG tablet TAKE TWO TABLETS EACH MORNING. AND 3 TABLETS IN THE EVENING AS DIRECTED   methocarbamol  (ROBAXIN ) 500 MG tablet Take 1 tablet (500 mg total) by mouth every 8 (eight) hours as needed for muscle spasms.   mirtazapine  (REMERON ) 7.5 MG tablet TAKE 1 TABLET BY MOUTH AT BEDTIME.   montelukast  (SINGULAIR ) 10 MG tablet TAKE 1 TABLET BY MOUTH EVERYDAY AT BEDTIME   MOUNJARO 5 MG/0.5ML Pen SMARTSIG:5 Milligram(s) SUB-Q Once a Week   Multiple Vitamin (MULTIVITAMIN WITH MINERALS) TABS tablet Take 1 tablet by mouth daily.   naloxone  (NARCAN ) nasal spray 4 mg/0.1 mL SPRAY 1 SPRAY INTO ONE NOSTRIL AS DIRECTED FOR OPIOID OVERDOSE (TURN PERSON ON SIDE AFTER DOSE. IF NO RESPONSE IN 2-3 MINUTES OR PERSON RESPONDS BUT RELAPSES, REPEAT USING A NEW SPRAY DEVICE AND SPRAY INTO THE OTHER NOSTRIL. CALL 911 AFTER USE.) * EMERGENCY USE ONLY *   nystatin  (MYCOSTATIN /NYSTOP ) powder  APPLY TOPICALLY TWICE DAILY AS NEEDED   nystatin  ointment (MYCOSTATIN ) Apply 1 Application topically 2 (two) times daily.   olaparib  (LYNPARZA ) 150 MG tablet Take 2 tablets (300 mg total) by mouth 2 (two) times daily. Swallow whole. May take with food to decrease nausea and vomiting.   ondansetron  (ZOFRAN ) 8 MG tablet Take 1 tablet (8 mg total) by mouth every 8 (eight) hours as needed for nausea or vomiting. Start on the third day after carboplatin .   ondansetron  (ZOFRAN -ODT) 8 MG disintegrating tablet Take 1 tablet (8 mg total) by mouth every 8 (eight) hours as needed for nausea or vomiting.   pantoprazole  (PROTONIX ) 40 MG tablet TAKE 1 TABLET BY MOUTH EVERY DAY   pravastatin  (PRAVACHOL ) 40 MG tablet TAKE 1 TABLET BY MOUTH EVERY DAY   prochlorperazine  (COMPAZINE ) 10 MG tablet Take 1 tablet (10 mg total) by mouth every 6 (six) hours as needed for nausea or vomiting.   UBRELVY  100 MG TABS TAKE 1 TABLET (100 MG TOTAL) BY MOUTH AS NEEDED (MIGRAINE).   zinc  sulfate 220 (50 Zn) MG capsule Take 1 capsule (220 mg total) by mouth daily.    Past Medical History:  Diagnosis Date   Abnormal cardiovascular stress test 09/21/2018   Formatting of this note might be different from the original. Lexiscan Myoview 09/16/2018 revealed mild anterior ischemia   Adverse effect of motion 05/10/2014   AKI (acute kidney injury) (HCC) 10/22/2022   Anxiety    Arthritis    r knee   Asthma    Breast cyst 05/10/2014   CAD (coronary artery disease)    a.) LHC 06/04/[facility] at [facility]; non-obstructive CAD. b.) CTA with FFR 10/08/2018: extensive mixed plaque proximal to mid LAD (51-69%); Coronary Ca score 217; FFR 0.71 dPDA, 0.86 mLAD, 0.87 dLCx.   CCF (congestive cardiac failure) (HCC) 06/06/2008   a.) 30% EF. b.) TTE 06/03/2011: EF >55%; triv MR. c.) TTE 04/27/2018: EF 55%; mild LVH; triv PR, mild MR/TR; G1DD.   Cellulitis of left lower extremity 09/18/2022   Chest pain with high risk for cardiac etiology 07/02/2016   Chronic  use of opiate drug for therapeutic purpose 02/13/2022   Formatting of this note might be different from the original. [facility] Contract signed on 04/18/16 & updated 03/04/17; UDS done on 04/18/16.   Complication of anesthesia    Diabetic foot infection (HCC) 03/13/2015   Diabetic foot ulcer (HCC) 02/08/2021   Diabetic ulcer of left heel  associated with type 2 diabetes mellitus (HCC) 02/08/2021   Eczema    Family history of adverse reaction to anesthesia    a.) PONV in mother and grandmother   Gas gangrene (HCC) 09/18/2022   GERD (gastroesophageal reflux disease) 05/10/2014   History of kidney stones    History of MSSA bacteremia due to osteomyelitis L foot 09/18/22 10/22/2022   HLD (hyperlipidemia)    Hyponatremia 03/13/2015   Migraines    Motion sickness    MSSA bacteremia 09/20/2022   Osteomyelitis (HCC) 09/18/2022   Other acute osteomyelitis, left ankle and foot (HCC) 02/13/2022   Panic attacks    Peripheral edema 04/27/2018   Pneumonia    PONV (postoperative nausea and vomiting)    Sepsis secondary to diabetic foot infection 03/13/2015   Sepsis secondary to diabetic foot infection 03/13/2015   T2DM (type 2 diabetes mellitus) (HCC)    Takotsubo cardiomyopathy / transient apical balooning syndrome / stress-induced cardiomyopathy 05/10/2014   Unspecified essential hypertension     Past Surgical History:  Procedure Laterality Date   AMPUTATION Left 09/19/2022   Procedure: AMPUTATION BELOW KNEE;  Surgeon: Jackquelyn Mass, MD;  Location: [facility];  Service: Vascular;  Laterality: Left;   AMPUTATION Left 09/24/2022   Procedure: AMPUTATION BELOW KNEE WITH WOUND CLOSURE;  Surgeon: Jackquelyn Mass, MD;  Location: [facility];  Service: Vascular;  Laterality: Left;   AMPUTATION TOE Left 03/16/2015   Procedure: left fifth toe amputation with incision and drainage;  Surgeon: Anell Baptist, DPM;  Location: [facility];  Service: Podiatry;  Laterality: Left;   APPLICATION OF WOUND VAC Left  02/14/2021   Procedure: APPLICATION OF WOUND VAC;  Surgeon: Anell Baptist, DPM;  Location: [facility];  Service: Podiatry;  Laterality: Left;   APPLICATION OF WOUND VAC Left 04/16/2021   Procedure: APPLICATION OF WOUND VAC;  Surgeon: Thornell Flirt, DO;  Location: [facility];  Service: Plastics;  Laterality: Left;   CARDIAC CATHETERIZATION Left 06/04/2009   Procedure: CARDIAC CATHETERIZATION; Location: [facility]   CHOLECYSTECTOMY     DEBRIDEMENT AND CLOSURE WOUND Left 04/16/2021   Procedure: DEBRIDEMENT AND CLOSURE WOUND;  Surgeon: Thornell Flirt, DO;  Location:  SURGERY CENTER;  Service: Plastics;  Laterality: Left;  1 hour   IR IMAGING GUIDED PORT INSERTION  11/18/2022   IRRIGATION AND DEBRIDEMENT FOOT Left 02/08/2021   Procedure: IRRIGATION AND DEBRIDEMENT FOOT - LFT HEEL ULCER;  Surgeon: Anell Baptist, DPM;  Location: [facility];  Service: Podiatry;  Laterality: Left;   IRRIGATION AND DEBRIDEMENT FOOT Left 02/14/2021   Procedure: IRRIGATION AND DEBRIDEMENT LEFT HEEL;  Surgeon: Anell Baptist, DPM;  Location: [facility];  Service: Podiatry;  Laterality: Left;   kidney stone removal     KNEE ARTHROSCOPY W/ MENISCAL REPAIR     NOSE SURGERY  01/07/1989   due to fracture   TEE WITHOUT CARDIOVERSION N/A 09/26/2022   Procedure: TRANSESOPHAGEAL ECHOCARDIOGRAM;  Surgeon: Isabell Manzanilla, DO;  Location: [facility];  Service: Cardiovascular;  Laterality: N/A;   TOE AMPUTATION     second toe   TONSILLECTOMY     TOTAL KNEE ARTHROPLASTY  01/11/2011   Procedure: TOTAL KNEE ARTHROPLASTY;  Surgeon: Boston Byers;  Location: [facility];  Service: Orthopedics;  Laterality: Right;  COMPUTER ASSISTED TOTAL KNEE REPLACEMENT    Social History Social History   Tobacco Use   Smoking status: Former    Current packs/day: 0.00    Types: Cigarettes    Quit date: 03/12/1985  Years since quitting: 38.2   Smokeless tobacco: Never  Vaping Use   Vaping status: Never Used   Substance Use Topics   Alcohol use: No   Drug use: No    Family History Family History  Problem Relation Age of Onset   Transient ischemic attack Mother    Asthma Mother    Hypertension Mother    Lung cancer Father 80   Hypertension Father    Asthma Sister    Anxiety disorder Sister    Asthma Sister    Hypertension Brother    Ovarian cancer Maternal Aunt 5   Melanoma Maternal Uncle        dx 93s   Lung cancer Paternal Aunt 24   Stomach cancer Maternal Grandmother    Colon cancer Maternal Grandmother    Lymphoma Maternal Grandmother    Melanoma Maternal Grandfather 72       sun exposure   Lung cancer Paternal Grandmother 55   Kidney cancer Paternal Grandfather        dx early   Crohn's disease Other    GER disease Other    Breast cancer Neg Hx     Allergies  Allergen Reactions   Shellfish Allergy Anaphylaxis   Codeine Other (See Comments)    Migraine  Other Reaction(s): Other (See Comments)  Reaction:  Severe migraines   Morphine  And Codeine Other (See Comments)    Reaction:  Severe migraines    Rosuvastatin     Other reaction(s): Muscle Pain   Percocet [Oxycodone -Acetaminophen ] Nausea And Vomiting   Sulfa Antibiotics Rash   Theophyllines Itching and Rash     REVIEW OF SYSTEMS (Negative unless checked)  Constitutional: [] Weight loss  [] Fever  [] Chills Cardiac: [] Chest pain   [] Chest pressure   [] Palpitations   [] Shortness of breath when laying flat   [] Shortness of breath with exertion. Vascular:  [x] Pain in legs with walking   [] Pain in legs at rest  [] History of DVT   [] Phlebitis   [] Swelling in legs   [] Varicose veins   [] Non-healing ulcers Pulmonary:   [] Uses home oxygen    [] Productive cough   [] Hemoptysis   [] Wheeze  [] COPD   [x] Asthma Neurologic:  [] Dizziness   [] Seizures   [] History of stroke   [] History of TIA  [] Aphasia   [] Vissual changes   [] Weakness or numbness in arm   [x] Weakness or numbness in leg Musculoskeletal:   [] Joint swelling    [] Joint pain   [] Low back pain Hematologic:  [] Easy bruising  [] Easy bleeding   [] Hypercoagulable state   [] Anemic Gastrointestinal:  [] Diarrhea   [] Vomiting  [x] Gastroesophageal reflux/heartburn   [] Difficulty swallowing. Genitourinary:  [] Chronic kidney disease   [] Difficult urination  [] Frequent urination   [] Blood in urine Skin:  [] Rashes   [] Ulcers  Psychological:  [x] History of anxiety   []  History of major depression.  Physical Examination  Vitals:   06/16/23 0935  BP: (!) 175/75  Pulse: 86  Resp: 18  Weight: 288 lb 3.2 oz (130.7 kg)  Height: 5\' 9"  (1.753 m)   Body mass index is 42.56 kg/m. Gen: WD/WN, NAD Head: [place]/AT, No temporalis wasting.  Ear/Nose/Throat: Hearing grossly intact, nares w/o erythema or drainage Eyes: PER, EOMI, sclera nonicteric.  Neck: Supple, no masses.  No bruit or JVD.  Pulmonary:  Good air movement, no audible wheezing, no use of accessory muscles.  Cardiac: RRR, normal S1, S2, no Murmurs. Vascular:  mild trophic changes, no open wounds Vessel Right Left  Radial Palpable Palpable  PT Not Palpable Not Palpable  DP Not Palpable Not Palpable  Gastrointestinal: soft, non-distended. No guarding/no peritoneal signs.  Musculoskeletal: M/S 5/5 throughout.  No visible deformity.  Neurologic: CN 2-12 intact. Pain and light touch intact in extremities.  Symmetrical.  Speech is fluent. Motor exam as listed above. Psychiatric: Judgment intact, Mood & affect appropriate for pt's clinical situation. Dermatologic: No rashes or ulcers noted.  No changes consistent with cellulitis.   CBC Lab Results  Component Value Date   WBC 5.8 06/06/2023   HGB 9.5 (L) 06/06/2023   HCT 28.5 (L) 06/06/2023   MCV 102.9 (H) 06/06/2023   PLT 142 (L) 06/06/2023    BMET    Component Value Date/Time   NA 135 06/06/2023 0756   NA 132 (L) 08/07/2022 1210   NA 133 (L) 08/21/2012 1523   K 4.5 06/06/2023 0756   K 4.1 08/21/2012 1523   CL 104 06/06/2023 0756   CL 102  08/21/2012 1523   CO2 22 06/06/2023 0756   CO2 26 08/21/2012 1523   GLUCOSE 220 (H) 06/06/2023 0756   GLUCOSE 306 (H) 08/21/2012 1523   BUN 30 (H) 06/06/2023 0756   BUN 23 08/07/2022 1210   BUN 17 08/21/2012 1523   CREATININE 1.10 (H) 06/06/2023 0756   CREATININE 0.69 08/21/2012 1523   CALCIUM 8.7 (L) 06/06/2023 0756   CALCIUM 8.8 08/21/2012 1523   GFRNONAA 58 (L) 06/06/2023 0756   GFRNONAA >60 08/21/2012 1523   GFRAA 117 11/04/2016 1109   GFRAA >60 08/21/2012 1523   Estimated Creatinine Clearance: 81 mL/min (A) (by C-G formula based on SCr of 1.1 mg/dL (H)).  COAG Lab Results  Component Value Date   INR 1.2 11/18/2022   INR 1.5 (H) 09/18/2022   INR 1.2 02/07/2021    Radiology No results found.   Assessment/Plan There are no diagnoses linked to this encounter.   Devon Fogo, MD  06/16/2023 9:44 AM

## 2023-06-18 ENCOUNTER — Telehealth: Payer: Self-pay

## 2023-06-18 ENCOUNTER — Encounter (INDEPENDENT_AMBULATORY_CARE_PROVIDER_SITE_OTHER): Payer: Self-pay | Admitting: Vascular Surgery

## 2023-06-18 ENCOUNTER — Encounter: Payer: Self-pay | Admitting: Oncology

## 2023-06-18 ENCOUNTER — Other Ambulatory Visit: Payer: Self-pay | Admitting: Oncology

## 2023-06-18 DIAGNOSIS — C482 Malignant neoplasm of peritoneum, unspecified: Secondary | ICD-10-CM

## 2023-06-18 MED ORDER — PROCHLORPERAZINE MALEATE 10 MG PO TABS
10.0000 mg | ORAL_TABLET | Freq: Four times a day (QID) | ORAL | 1 refills | Status: AC | PRN
Start: 2023-06-18 — End: ?

## 2023-06-18 NOTE — Telephone Encounter (Signed)
 Received call from Nancy [name]. She is having increasing nausea from her oral olaparib . Zofran  not very effective. She has taken her compazine  and found this to work better. She will continue to take it as needed and needs a refill. Diarrhea started today and she has taken imodium x1. Instructed to take 1 tab after each loose stool and not to exceed 4 pills/day at present. She will make us  aware if symptoms persists and we will arrange clinic visit for symptom management.

## 2023-06-20 ENCOUNTER — Encounter: Payer: Self-pay | Admitting: Hospice and Palliative Medicine

## 2023-06-20 ENCOUNTER — Ambulatory Visit

## 2023-06-20 ENCOUNTER — Inpatient Hospital Stay (HOSPITAL_BASED_OUTPATIENT_CLINIC_OR_DEPARTMENT_OTHER): Admitting: Hospice and Palliative Medicine

## 2023-06-20 ENCOUNTER — Other Ambulatory Visit: Payer: Self-pay

## 2023-06-20 ENCOUNTER — Inpatient Hospital Stay: Attending: Oncology

## 2023-06-20 VITALS — BP 143/66 | HR 60

## 2023-06-20 VITALS — BP 190/73 | HR 87 | Temp 96.6°F | Resp 18

## 2023-06-20 DIAGNOSIS — C482 Malignant neoplasm of peritoneum, unspecified: Secondary | ICD-10-CM

## 2023-06-20 DIAGNOSIS — C786 Secondary malignant neoplasm of retroperitoneum and peritoneum: Secondary | ICD-10-CM | POA: Diagnosis not present

## 2023-06-20 DIAGNOSIS — E860 Dehydration: Secondary | ICD-10-CM

## 2023-06-20 DIAGNOSIS — R197 Diarrhea, unspecified: Secondary | ICD-10-CM

## 2023-06-20 DIAGNOSIS — Z87891 Personal history of nicotine dependence: Secondary | ICD-10-CM | POA: Insufficient documentation

## 2023-06-20 DIAGNOSIS — C569 Malignant neoplasm of unspecified ovary: Secondary | ICD-10-CM

## 2023-06-20 DIAGNOSIS — C563 Malignant neoplasm of bilateral ovaries: Secondary | ICD-10-CM | POA: Insufficient documentation

## 2023-06-20 DIAGNOSIS — Z95828 Presence of other vascular implants and grafts: Secondary | ICD-10-CM

## 2023-06-20 LAB — GASTROINTESTINAL PANEL BY PCR, STOOL (REPLACES STOOL CULTURE)

## 2023-06-20 LAB — CMP (CANCER CENTER ONLY)
ALT: 33 U/L (ref 0–44)
AST: 25 U/L (ref 15–41)
Albumin: 3.8 g/dL (ref 3.5–5.0)
Alkaline Phosphatase: 45 U/L (ref 38–126)
Anion gap: 8 (ref 5–15)
BUN: 36 mg/dL — ABNORMAL HIGH (ref 6–20)
CO2: 20 mmol/L — ABNORMAL LOW (ref 22–32)
Calcium: 8.5 mg/dL — ABNORMAL LOW (ref 8.9–10.3)
Chloride: 106 mmol/L (ref 98–111)
Creatinine: 1.32 mg/dL — ABNORMAL HIGH (ref 0.44–1.00)
GFR, Estimated: 47 mL/min — ABNORMAL LOW (ref >60)
Glucose, Bld: 204 mg/dL — ABNORMAL HIGH (ref 70–99)
Potassium: 4.3 mmol/L (ref 3.5–5.1)
Sodium: 134 mmol/L — ABNORMAL LOW (ref 135–145)
Total Bilirubin: 0.4 mg/dL (ref 0.0–1.2)
Total Protein: 7.3 g/dL (ref 6.5–8.1)

## 2023-06-20 LAB — CBC WITH DIFFERENTIAL (CANCER CENTER ONLY)
Abs Immature Granulocytes: 0.03 10*3/uL (ref 0.00–0.07)
Basophils Absolute: 0 10*3/uL (ref 0.0–0.1)
Basophils Relative: 0 %
Eosinophils Absolute: 0.7 10*3/uL — ABNORMAL HIGH (ref 0.0–0.5)
Eosinophils Relative: 9 %
HCT: 32.6 % — ABNORMAL LOW (ref 36.0–46.0)
Hemoglobin: 10.5 g/dL — ABNORMAL LOW (ref 12.0–15.0)
Immature Granulocytes: 0 %
Lymphocytes Relative: 26 %
Lymphs Abs: 1.9 10*3/uL (ref 0.7–4.0)
MCH: 33.4 pg (ref 26.0–34.0)
MCHC: 32.2 g/dL (ref 30.0–36.0)
MCV: 103.8 fL — ABNORMAL HIGH (ref 80.0–100.0)
Monocytes Absolute: 0.6 10*3/uL (ref 0.1–1.0)
Monocytes Relative: 9 %
Neutro Abs: 3.9 10*3/uL (ref 1.7–7.7)
Neutrophils Relative %: 56 %
Platelet Count: 325 10*3/uL (ref 150–400)
RBC: 3.14 MIL/uL — ABNORMAL LOW (ref 3.87–5.11)
RDW: 16.8 % — ABNORMAL HIGH (ref 11.5–15.5)
WBC Count: 7.1 10*3/uL (ref 4.0–10.5)
nRBC: 0.3 % — ABNORMAL HIGH (ref 0.0–0.2)

## 2023-06-20 LAB — C DIFFICILE QUICK SCREEN W PCR REFLEX
C Diff antigen: NEGATIVE
C Diff interpretation: NOT DETECTED
C Diff toxin: NEGATIVE

## 2023-06-20 LAB — MAGNESIUM: Magnesium: 1.5 mg/dL — ABNORMAL LOW (ref 1.7–2.4)

## 2023-06-20 MED ORDER — MAGNESIUM SULFATE 2 GM/50ML IV SOLN
2.0000 g | Freq: Once | INTRAVENOUS | Status: AC
  Administered 2023-06-20: 2 g via INTRAVENOUS
  Filled 2023-06-20: qty 50

## 2023-06-20 MED ORDER — SODIUM CHLORIDE 0.9% FLUSH
10.0000 mL | Freq: Once | INTRAVENOUS | Status: DC
  Filled 2023-06-20: qty 10

## 2023-06-20 MED ORDER — SODIUM CHLORIDE 0.9% FLUSH
10.0000 mL | Freq: Once | INTRAVENOUS | Status: AC
  Administered 2023-06-20: 10 mL via INTRAVENOUS
  Filled 2023-06-20: qty 10

## 2023-06-20 MED ORDER — SODIUM CHLORIDE 0.9 % IV SOLN
INTRAVENOUS | Status: DC
  Filled 2023-06-20 (×2): qty 250

## 2023-06-20 MED ORDER — DIPHENOXYLATE-ATROPINE 2.5-0.025 MG PO TABS: 1.0000 | ORAL_TABLET | Freq: Four times a day (QID) | ORAL | 0 refills | Status: AC | PRN

## 2023-06-20 MED ORDER — HEPARIN SOD (PORK) LOCK FLUSH 100 UNIT/ML IV SOLN
500.0000 [IU] | Freq: Once | INTRAVENOUS | Status: AC
  Administered 2023-06-20: 500 [IU]
  Filled 2023-06-20: qty 5

## 2023-06-20 NOTE — Progress Notes (Signed)
 Port flush/lab- documented on fluid encounter

## 2023-06-20 NOTE — Patient Instructions (Signed)
 Please hold the Lynparza  until you see Dr. Wilhelmenia Harada.

## 2023-06-20 NOTE — Progress Notes (Signed)
 Symptom Management Clinic [facility] at [facility] Telephone:([phone]) [phone] [phone]) [phone]  Patient Care Team: Trenda Frisk, FNP as PCP - General (Family Medicine) Rochell Chroman, RN as Oncology Nurse Navigator Timmy Forbes, MD as Consulting Physician (Oncology)   NAME OF PATIENT: Nancy [name]  [id number]  Oct 11, 1965   DATE OF VISIT: 06/20/23  REASON FOR CONSULT: JILLANN CHARETTE is a 58 y.o. 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  Patient's status post neoadjuvant CarboTaxol followed by debulking surgery on 02/20/2023 and additional 2 cycles of adjuvant CarboTaxol.  Patient now on olaparib  maintenance.  INTERVAL HISTORY: Patient saw Dr. Wilhelmenia Harada on 06/06/2023.  Patient was started on olaparib  300 mg twice daily maintenance.  Patient presents to [facility] today with complaint of diarrhea, poor oral intake, and weakness.  Patient states that since she started taking Lynparza , she has had profuse and persistent diarrhea.  She reports having up to 10-15 watery stools daily.  Oral intake has been poor.  Patient has felt progressively weak.  Denies abdominal pain.  She did have 1 episode of vomiting and has had some nausea.  She denies fever or chills.  Denies blood or mucus in stools.  States that she held the Lynparza  today.  She has also been unable to take her other oral medications.  Denies any neurologic complaints. Denies recent fevers or illnesses. Denies any easy bleeding or bruising. Denies chest pain. Denies urinary complaints. Patient offers no further specific complaints today.   PAST MEDICAL HISTORY: Past Medical History:  Diagnosis Date   Abnormal cardiovascular stress test 09/21/2018   Formatting of this note might be different from the original. Lexiscan Myoview 09/16/2018 revealed mild anterior ischemia   Adverse effect of motion 05/10/2014   AKI (acute kidney  injury) (HCC) 10/22/2022   Anxiety    Arthritis    r knee   Asthma    Breast cyst 05/10/2014   CAD (coronary artery disease)    a.) LHC 06/04/2009 at [facility]; non-obstructive CAD. b.) CTA with FFR 10/08/2018: extensive mixed plaque proximal to mid LAD (51-69%); Coronary Ca score 217; FFR 0.71 dPDA, 0.86 mLAD, 0.87 dLCx.   CCF (congestive cardiac failure) (HCC) 06/06/2008   a.) 30% EF. b.) TTE 06/03/2011: EF >55%; triv MR. c.) TTE 04/27/2018: EF 55%; mild LVH; triv PR, mild MR/TR; G1DD.   Cellulitis of left lower extremity 09/18/2022   Chest pain with high risk for cardiac etiology 07/02/2016   Chronic use of opiate drug for therapeutic purpose 02/13/2022   Formatting of this note might be different from the original. [facility] Pain Contract signed on 04/18/16 & updated 03/04/17; UDS done on 04/18/16.   Complication of anesthesia    Diabetic foot infection (HCC) 03/13/2015   Diabetic foot ulcer (HCC) 02/08/2021   Diabetic ulcer of left heel associated with type 2 diabetes mellitus (HCC) 02/08/2021   Eczema    Family history of adverse reaction to anesthesia    a.) PONV in mother and grandmother   Gas gangrene (HCC) 09/18/2022   GERD (gastroesophageal reflux disease) 05/10/2014   History of kidney stones    History of MSSA bacteremia due to osteomyelitis L foot 09/18/22 10/22/2022   HLD (hyperlipidemia)    Hyponatremia 03/13/2015   Migraines    Motion sickness    MSSA bacteremia 09/20/2022   Osteomyelitis (HCC) 09/18/2022   Other acute osteomyelitis, left ankle and foot (HCC) 02/13/2022   Panic attacks  Peripheral edema 04/27/2018   Pneumonia    PONV (postoperative nausea and vomiting)    Sepsis secondary to diabetic foot infection 03/13/2015   Sepsis secondary to diabetic foot infection 03/13/2015   T2DM (type 2 diabetes mellitus) (HCC)    Takotsubo cardiomyopathy / transient apical balooning syndrome / stress-induced cardiomyopathy 05/10/2014   Unspecified essential hypertension      PAST SURGICAL HISTORY:  Past Surgical History:  Procedure Laterality Date   AMPUTATION Left 09/19/2022   Procedure: AMPUTATION BELOW KNEE;  Surgeon: Jackquelyn Mass, MD;  Location: [facility];  Service: Vascular;  Laterality: Left;   AMPUTATION Left 09/24/2022   Procedure: AMPUTATION BELOW KNEE WITH WOUND CLOSURE;  Surgeon: Jackquelyn Mass, MD;  Location: [facility];  Service: Vascular;  Laterality: Left;   AMPUTATION TOE Left 03/16/2015   Procedure: left fifth toe amputation with incision and drainage;  Surgeon: Anell Baptist, DPM;  Location: [facility];  Service: Podiatry;  Laterality: Left;   APPLICATION OF WOUND VAC Left 02/14/2021   Procedure: APPLICATION OF WOUND VAC;  Surgeon: Anell Baptist, DPM;  Location: [facility];  Service: Podiatry;  Laterality: Left;   APPLICATION OF WOUND VAC Left 04/16/2021   Procedure: APPLICATION OF WOUND VAC;  Surgeon: Thornell Flirt, DO;  Location: [facility];  Service: Plastics;  Laterality: Left;   CARDIAC CATHETERIZATION Left 06/04/2009   Procedure: CARDIAC CATHETERIZATION; Location: [facility]   CHOLECYSTECTOMY     DEBRIDEMENT AND CLOSURE WOUND Left 04/16/2021   Procedure: DEBRIDEMENT AND CLOSURE WOUND;  Surgeon: Thornell Flirt, DO;  Location: [facility];  Service: Plastics;  Laterality: Left;  1 hour   IR IMAGING GUIDED PORT INSERTION  11/18/2022   IRRIGATION AND DEBRIDEMENT FOOT Left 02/08/2021   Procedure: IRRIGATION AND DEBRIDEMENT FOOT - LFT HEEL ULCER;  Surgeon: Anell Baptist, DPM;  Location: [facility];  Service: Podiatry;  Laterality: Left;   IRRIGATION AND DEBRIDEMENT FOOT Left 02/14/2021   Procedure: IRRIGATION AND DEBRIDEMENT LEFT HEEL;  Surgeon: Anell Baptist, DPM;  Location: [facility];  Service: Podiatry;  Laterality: Left;   kidney stone removal     KNEE ARTHROSCOPY W/ MENISCAL REPAIR     NOSE SURGERY  01/07/1989   due to fracture   TEE WITHOUT CARDIOVERSION N/A 09/26/2022   Procedure:  TRANSESOPHAGEAL ECHOCARDIOGRAM;  Surgeon: Isabell Manzanilla, DO;  Location: [facility];  Service: Cardiovascular;  Laterality: N/A;   TOE AMPUTATION     second toe   TONSILLECTOMY     TOTAL KNEE ARTHROPLASTY  01/11/2011   Procedure: TOTAL KNEE ARTHROPLASTY;  Surgeon: Boston Byers;  Location: [facility];  Service: Orthopedics;  Laterality: Right;  COMPUTER ASSISTED TOTAL KNEE REPLACEMENT    HEMATOLOGY/ONCOLOGY HISTORY:  Oncology History  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)  11/02/2022 Imaging   Large volume ascites in the abdomen or pelvis with omental caking.Appearance is concerning for peritoneal malignancy/carcinomatosis,often seen with ovarian cancer, but no ovarian mass visualized. Aortic atherosclerosis    11/06/2022 Initial Diagnosis   Primary peritoneal carcinomatosis   -presented to emergency room for evaluation of abdominal pain/bloating, poor oral intake, nonbloody nonbilious vomiting  Patient underwent paracentesis, cytology showed malignant cells. IHC can not be added.    11/14/2022 Imaging   CT chest w contrast  1. Prominent subcentimeter right retrocrural lymph nodes, metastatic disease can not be excluded. 2. Nonspecific ground-glass nodule of the superior portion of the right lower lobe measuring 7 mm. Recommend attention on follow-up. 3. Small left  pleural effusion. 4. Partially visualized large volume abdominal ascites and peritoneal thickening, volume of ascites appears increased when compared with the prior CT. 5. Coronary artery calcifications and aortic Atherosclerosis (ICD10-I70.0).   11/18/2022 Procedure   Medi port placement by IR   11/18/2022 Procedure   Omentum biopsy showed  1. Omentum, biopsy,  :   - INVOLVEMENT BY HIGH-GRADE SEROUS CARCINOMA OF GYNECOLOGIC ORIGIN.   Diagnosis Note : The carcinoma is positive for cytokeratin 7, PAX8, WT1, and ER.  Cytokeratin 20 and GATA3 are negative.  P53 is overexpressed.  The morphologic findings in  conjunction with the pattern of immunohistochemistry support the  above diagnosis.   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 The report date is 11/25/2022.  The CancerNext-Expanded + RNAinsight gene panel offered by [employer] and includes sequencing and rearrangement analysis for the following 71 genes: AIP, ALK, APC*, ATM*, AXIN2, BAP1, BARD1, BMPR1A, BRCA1*, BRCA2*, BRIP1*, CDC73, CDH1*,CDK4, CDKN1B, CDKN2A, CHEK2*, CTNNA1, DICER1, FH, FLCN, KIF1B, LZTR1, MAX, MEN1, MET, MLH1*, MSH2*, MSH3, MSH6*, MUTYH*, NF1*, NF2, NTHL1, PALB2*, PHOX2B, PMS2*, POT1, PRKAR1A, PTCH1, PTEN*, RAD51C*, RAD51D*,RB1, RET, SDHA, SDHAF2, SDHB, SDHC, SDHD, SMAD4, SMARCA4, SMARCB1, SMARCE1, STK11, SUFU, TMEM127, TP53*,TSC1, TSC2, VHL; EGFR, EGLN1, HOXB13, KIT, MITF, PDGFRA, POLD1 and POLE (sequencing only); EPCAM and GREM1 (deletion/duplication only).    01/13/2023 Imaging   CT chest abdomen pelvis w contrast showed  Decreasing ascites, trace residual. The areas of omental caking and nodularity along the perineum is decreasing as well with significant residual particularly in the upper anterior abdomen.   Stable small mesenteric and retroperitoneal nodes. Resolved left pleural effusion. No developing new mass lesion. Small left renal angiomyolipoma and upper pole nonobstructing renal stone.   02/20/2023 Surgery    Interval debulking R1 - robotic assisted laparoscopic total hysterectomy, bilateral salpingo-oophorectomy, lysis of adhesions, peritoneal stripping, and omentectomy with stage IIIC high grade serous carcinoma of bilateral ovaries   A. Uterus, bilateral fallopian tubes, and bilateral ovaries, total hysterectomy and bilateral salpingo-oophorectomy: Residual high grade serous carcinoma (1.6 cm)  involving bilateral ovaries, left fallopian tube, and uterine serosa.   Additional findings:  Uterus with leiomyomata (up to 1.1 cm) Right fallopian tube with no pathologic diagnosis See synoptic report.   B. Peritoneal nodules, excision: High grade serous carcinoma (up to 0.1 cm).  C. Omentum, omentectomy:  High grade serous carcinoma (0.7 cm). Moderate treatment response (CRS2).     03/04/2023 Cancer Staging   Staging form: Ovary, AJCC 7th Edition - Pathologic stage from 03/04/2023: FIGO Stage IIIC (cM0) - Signed by Timmy Forbes, MD on 03/13/2023 Stage prefix: Initial diagnosis Laterality: Bilateral     ALLERGIES:  is allergic to shellfish allergy, codeine, morphine  and codeine, rosuvastatin, percocet [oxycodone -acetaminophen ], sulfa antibiotics, and theophyllines.  MEDICATIONS:  Current Outpatient Medications  Medication Sig Dispense Refill   acidophilus (RISAQUAD) CAPS capsule Take 1 capsule by mouth daily.     albuterol  (PROVENTIL  HFA;VENTOLIN  HFA) 108 (90 Base) MCG/ACT inhaler Inhale 2 puffs into the lungs every 4 (four) hours as needed for wheezing or shortness of breath. Reported on 03/29/2015 1 Inhaler 3   albuterol  (PROVENTIL ) (2.5 MG/3ML) 0.083% nebulizer solution Take 2.5 mg by nebulization as needed.     ALPRAZolam  (XANAX ) 0.5 MG tablet TAKE 1/2 TO 1 TABLET BY MOUTH AS  NEEDED FOR PANIC ATTACKS 30 tablet 2   ascorbic acid  (VITAMIN C ) 500 MG tablet Take 1 tablet (500 mg total) by mouth 2 (two) times daily. 60 tablet 0   aspirin  EC 81 MG tablet Take 81 mg by mouth daily.     carvedilol  (COREG ) 6.25 MG tablet TAKE 1 TABLET BY MOUTH TWICE A DAY 180 tablet 1   cholecalciferol (VITAMIN D3) 25 MCG (1000 UNIT) tablet Take 1,000 Units by mouth daily.     Continuous Glucose Sensor (FREESTYLE LIBRE 2 SENSOR) MISC 1 each by Does not apply route every 14 (fourteen) days. 2 each 3   Cyanocobalamin  (VITAMIN B 12) 500 MCG TABS Take by mouth daily at 8 pm.     desvenlafaxine (PRISTIQ) 100 MG  24 hr tablet TAKE 1 TABLET BY MOUTH EVERY DAY 90 tablet 2   dexamethasone  (DECADRON ) 4 MG tablet Take 2 tablets (8mg ) by mouth daily starting the day after carboplatin  for 2 days. Take with food 30 tablet 1   dicyclomine  (BENTYL ) 10 MG capsule Take 1 capsule (10 mg total) by mouth 4 (four) times daily -  before meals and at bedtime. 60 capsule 1   Dulaglutide  (TRULICITY ) 1.5 MG/0.5ML SOAJ INJECT 1.5 MG SUBCUTANEOUSLY ONCE A WEEK 2 mL 2   ELIQUIS  2.5 MG TABS tablet TAKE 1 TABLET BY MOUTH TWICE A DAY 60 tablet 2   Ensure Max Protein (ENSURE MAX PROTEIN) LIQD Take 330 mLs (11 oz total) by mouth 2 (two) times daily.     fenofibrate  54 MG tablet TAKE 1 TABLET BY MOUTH EVERY DAY 30 tablet 11   gabapentin  (NEURONTIN ) 300 MG capsule TAKE 1 CAPSULE (300MG  TOTAL) BY MOUTH AT BEDTIME AS NEEDED 30 capsule 3   HYDROmorphone  (DILAUDID ) 2 MG tablet Take 1 tablet (2 mg total) by mouth every 4 (four) hours as needed for severe pain (pain score 7-10). 60 tablet 0   ibuprofen (ADVIL) 800 MG tablet TAKE 1 TABLET BY MOUTH EVERY 8 HOURS AS NEEDED 90 tablet 3   insulin  NPH-regular Human (70-30) 100 UNIT/ML injection Inject 15 Units into the skin in the morning and at bedtime.     iron  polysaccharides (NIFEREX) 150 MG capsule Take 1 capsule (150 mg total) by mouth daily. 30 capsule 2   lidocaine -prilocaine  (EMLA ) cream Apply to affected area once 30 g 3   lisinopril  (ZESTRIL ) 10 MG tablet Take 1 tablet (10 mg total) by mouth every morning AND 0.5 tablets (5 mg total) at bedtime. 45 tablet 11   lisinopril  (ZESTRIL ) 5 MG tablet Take 1 tablet (5 mg total) by mouth 2 (two) times daily. 180 tablet 1   metFORMIN  (GLUCOPHAGE ) 500 MG tablet TAKE TWO TABLETS EACH MORNING. AND 3 TABLETS IN THE EVENING AS DIRECTED 150 tablet 5   methocarbamol  (ROBAXIN ) 500 MG tablet Take 1 tablet (500 mg total) by mouth every 8 (eight) hours as needed for muscle spasms. 30 tablet 0   mirtazapine  (REMERON ) 7.5 MG tablet TAKE 1 TABLET BY MOUTH AT  BEDTIME. 90 tablet 2   montelukast  (SINGULAIR ) 10 MG tablet TAKE 1 TABLET BY MOUTH EVERYDAY AT BEDTIME 30 tablet 5   MOUNJARO  5 MG/0.5ML Pen SMARTSIG:5 Milligram(s) SUB-Q Once a Week     Multiple Vitamin (MULTIVITAMIN WITH MINERALS) TABS tablet Take 1 tablet by mouth daily. 30 tablet 2   naloxone  (NARCAN ) nasal spray 4 mg/0.1 mL SPRAY 1 SPRAY INTO ONE NOSTRIL AS DIRECTED FOR OPIOID OVERDOSE (TURN PERSON ON SIDE AFTER DOSE. IF NO RESPONSE IN  2-3 MINUTES OR PERSON RESPONDS BUT RELAPSES, REPEAT USING A NEW SPRAY DEVICE AND SPRAY INTO THE OTHER NOSTRIL. CALL 911 AFTER USE.) * EMERGENCY USE ONLY * 1 each 0   nystatin  (MYCOSTATIN /NYSTOP ) powder APPLY TOPICALLY TWICE DAILY AS NEEDED 30 g 3   nystatin  ointment (MYCOSTATIN ) Apply 1 Application topically 2 (two) times daily. 30 g 0   olaparib  (LYNPARZA ) 150 MG tablet Take 2 tablets (300 mg total) by mouth 2 (two) times daily. Swallow whole. May take with food to decrease nausea and vomiting. 120 tablet 1   ondansetron  (ZOFRAN ) 8 MG tablet Take 1 tablet (8 mg total) by mouth every 8 (eight) hours as needed for nausea or vomiting. Start on the third day after carboplatin . 30 tablet 1   ondansetron  (ZOFRAN -ODT) 8 MG disintegrating tablet Take 1 tablet (8 mg total) by mouth every 8 (eight) hours as needed for nausea or vomiting. 90 tablet 3   pantoprazole  (PROTONIX ) 40 MG tablet TAKE 1 TABLET BY MOUTH EVERY DAY 90 tablet 3   pravastatin  (PRAVACHOL ) 40 MG tablet TAKE 1 TABLET BY MOUTH EVERY DAY 30 tablet 5   prochlorperazine  (COMPAZINE ) 10 MG tablet Take 1 tablet (10 mg total) by mouth every 6 (six) hours as needed for nausea or vomiting. 90 tablet 1   UBRELVY  100 MG TABS TAKE 1 TABLET (100 MG TOTAL) BY MOUTH AS NEEDED (MIGRAINE). 12 tablet 1   zinc  sulfate 220 (50 Zn) MG capsule Take 1 capsule (220 mg total) by mouth daily. 30 capsule 0   No current facility-administered medications for this visit.   Facility-Administered Medications Ordered in Other Visits   Medication Dose Route Frequency Provider Last Rate Last Admin   sodium chloride  flush (NS) 0.9 % injection 10 mL  10 mL Intravenous Once Aster Screws, Carlene Che, NP        VITAL SIGNS: LMP 02/08/2013 (Within Months)  There were no vitals filed for this visit.  Estimated body mass index is 42.56 kg/m as calculated from the following:   Height as of 06/16/23: 5' 9 (1.753 m).   Weight as of 06/16/23: 288 lb 3.2 oz (130.7 kg).  LABS: CBC:    Component Value Date/Time   WBC 5.8 06/06/2023 0756   WBC 11.0 (H) 11/18/2022 0919   HGB 9.5 (L) 06/06/2023 0756   HGB 12.3 08/07/2022 1210   HCT 28.5 (L) 06/06/2023 0756   HCT 38.6 08/07/2022 1210   PLT 142 (L) 06/06/2023 0756   PLT 320 08/07/2022 1210   MCV 102.9 (H) 06/06/2023 0756   MCV 90 08/07/2022 1210   MCV 90 08/21/2012 1523   NEUTROABS 2.5 06/06/2023 0756   NEUTROABS 5.5 08/07/2022 1210   NEUTROABS 2.2 06/05/2011 0550   LYMPHSABS 2.4 06/06/2023 0756   LYMPHSABS 2.4 08/07/2022 1210   LYMPHSABS 2.7 06/05/2011 0550   MONOABS 0.7 06/06/2023 0756   MONOABS 0.4 06/05/2011 0550   EOSABS 0.0 06/06/2023 0756   EOSABS 0.5 (H) 08/07/2022 1210   EOSABS 0.6 06/05/2011 0550   BASOSABS 0.1 06/06/2023 0756   BASOSABS 0.1 08/07/2022 1210   BASOSABS 0.1 06/05/2011 0550   Comprehensive Metabolic Panel:    Component Value Date/Time   NA 135 06/06/2023 0756   NA 132 (L) 08/07/2022 1210   NA 133 (L) 08/21/2012 1523   K 4.5 06/06/2023 0756   K 4.1 08/21/2012 1523   CL 104 06/06/2023 0756   CL 102 08/21/2012 1523   CO2 22 06/06/2023 0756   CO2 26 08/21/2012 1523  BUN 30 (H) 06/06/2023 0756   BUN 23 08/07/2022 1210   BUN 17 08/21/2012 1523   CREATININE 1.10 (H) 06/06/2023 0756   CREATININE 0.69 08/21/2012 1523   GLUCOSE 220 (H) 06/06/2023 0756   GLUCOSE 306 (H) 08/21/2012 1523   CALCIUM 8.7 (L) 06/06/2023 0756   CALCIUM 8.8 08/21/2012 1523   AST 26 06/06/2023 0756   ALT 30 06/06/2023 0756   ALT 25 08/21/2012 1523   ALKPHOS 42 06/06/2023  0756   ALKPHOS 73 08/21/2012 1523   BILITOT 0.3 06/06/2023 0756   PROT 6.8 06/06/2023 0756   PROT 7.0 08/07/2022 1210   PROT 7.1 08/21/2012 1523   ALBUMIN 3.5 06/06/2023 0756   ALBUMIN 4.0 08/07/2022 1210   ALBUMIN 3.4 08/21/2012 1523    RADIOGRAPHIC STUDIES: No results found.  PERFORMANCE STATUS (ECOG) : 1 - Symptomatic but completely ambulatory  Review of Systems Unless otherwise noted, a complete review of systems is negative.  Physical Exam General: NAD Cardiovascular: regular rate and rhythm Pulmonary: clear ant fields Abdomen: soft, nontender, + bowel sounds GU: no suprapubic tenderness Extremities: no edema, no joint deformities Skin: no rashes Neurological: Weakness but otherwise nonfocal  IMPRESSION/PLAN: High-grade serous ovarian cancer -recently started on maintenance Lynparza  now with profuse diarrhea.  Discussed with Dr. Wilhelmenia Harada and will hold Lynparza  until patient has MD follow-up.  Diarrhea -most likely this is secondary to Lynparza .  Again, discussed with Dr. Wilhelmenia Harada and patient instructed to hold medication until MD follow-up.  Will also send for stool studies to rule out infectious etiology.  Will start on Lomotil if stool studies negative.  Proceed with IV fluids today.  Hypomagnesemia -secondary to diarrhea.  Proceed with replating IV mag.  Will recheck labs next week.  Hypertension -patient was unable to take antihypertensives this morning.  She will do so when she leaves the clinic.  Case and plan discussed with Dr. Wilhelmenia Harada.  Patient to RTC early next week for repeat labs/fluids.  ED triggers reviewed.  Patient expressed understanding and was in agreement with this plan. She also understands that She can call clinic at any time with any questions, concerns, or complaints.   Thank you for allowing me to participate in the care of this very pleasant patient.   Time Total: 25 minutes  Visit consisted of counseling and education dealing with the complex and  emotionally intense issues of symptom management in the setting of serious illness.Greater than 50%  of this time was spent counseling and coordinating care related to the above assessment and plan.  Signed by: Gerilyn Kobus, PhD, NP-C

## 2023-06-23 ENCOUNTER — Inpatient Hospital Stay

## 2023-06-23 ENCOUNTER — Other Ambulatory Visit

## 2023-06-23 ENCOUNTER — Ambulatory Visit

## 2023-06-23 DIAGNOSIS — E860 Dehydration: Secondary | ICD-10-CM

## 2023-06-23 DIAGNOSIS — C482 Malignant neoplasm of peritoneum, unspecified: Secondary | ICD-10-CM

## 2023-06-23 DIAGNOSIS — R197 Diarrhea, unspecified: Secondary | ICD-10-CM

## 2023-06-23 LAB — CBC WITH DIFFERENTIAL (CANCER CENTER ONLY)
Abs Immature Granulocytes: 0.07 10*3/uL (ref 0.00–0.07)
Basophils Absolute: 0.1 10*3/uL (ref 0.0–0.1)
Basophils Relative: 1 %
Eosinophils Absolute: 0.6 10*3/uL — ABNORMAL HIGH (ref 0.0–0.5)
Eosinophils Relative: 8 %
HCT: 30.3 % — ABNORMAL LOW (ref 36.0–46.0)
Hemoglobin: 9.9 g/dL — ABNORMAL LOW (ref 12.0–15.0)
Immature Granulocytes: 1 %
Lymphocytes Relative: 35 %
Lymphs Abs: 2.4 10*3/uL (ref 0.7–4.0)
MCH: 33.9 pg (ref 26.0–34.0)
MCHC: 32.7 g/dL (ref 30.0–36.0)
MCV: 103.8 fL — ABNORMAL HIGH (ref 80.0–100.0)
Monocytes Absolute: 0.6 10*3/uL (ref 0.1–1.0)
Monocytes Relative: 9 %
Neutro Abs: 3.1 10*3/uL (ref 1.7–7.7)
Neutrophils Relative %: 46 %
Platelet Count: 268 10*3/uL (ref 150–400)
RBC: 2.92 MIL/uL — ABNORMAL LOW (ref 3.87–5.11)
RDW: 16.5 % — ABNORMAL HIGH (ref 11.5–15.5)
WBC Count: 6.8 10*3/uL (ref 4.0–10.5)
nRBC: 0.3 % — ABNORMAL HIGH (ref 0.0–0.2)

## 2023-06-23 LAB — CMP (CANCER CENTER ONLY)
ALT: 28 U/L (ref 0–44)
AST: 26 U/L (ref 15–41)
Albumin: 3.7 g/dL (ref 3.5–5.0)
Alkaline Phosphatase: 44 U/L (ref 38–126)
Anion gap: 9 (ref 5–15)
BUN: 28 mg/dL — ABNORMAL HIGH (ref 6–20)
CO2: 21 mmol/L — ABNORMAL LOW (ref 22–32)
Calcium: 9 mg/dL (ref 8.9–10.3)
Chloride: 107 mmol/L (ref 98–111)
Creatinine: 0.91 mg/dL (ref 0.44–1.00)
GFR, Estimated: >60 mL/min (ref >60)
Glucose, Bld: 157 mg/dL — ABNORMAL HIGH (ref 70–99)
Potassium: 4.4 mmol/L (ref 3.5–5.1)
Sodium: 137 mmol/L (ref 135–145)
Total Bilirubin: 0.5 mg/dL (ref 0.0–1.2)
Total Protein: 6.8 g/dL (ref 6.5–8.1)

## 2023-06-23 LAB — MAGNESIUM: Magnesium: 1.5 mg/dL — ABNORMAL LOW (ref 1.7–2.4)

## 2023-06-23 MED ORDER — SODIUM CHLORIDE 0.9 % IV SOLN
INTRAVENOUS | Status: DC
  Filled 2023-06-23 (×2): qty 250

## 2023-06-23 MED ORDER — HEPARIN SOD (PORK) LOCK FLUSH 100 UNIT/ML IV SOLN
500.0000 [IU] | Freq: Once | INTRAVENOUS | Status: AC
  Administered 2023-06-23: 500 [IU] via INTRAVENOUS
  Filled 2023-06-23: qty 5

## 2023-06-23 MED ORDER — MAGNESIUM SULFATE 2 GM/50ML IV SOLN
2.0000 g | Freq: Once | INTRAVENOUS | Status: AC
  Filled 2023-06-23: qty 50

## 2023-06-23 NOTE — Progress Notes (Signed)
 [facility] CSW Progress Note  Visual merchandiser met with patient to assess needs per the request of [employer].  She stated she would like to complete her advance directives.  Provided education regarding the process.  She will attend the next clinic on June 26th, at 11am.  Patient expressed no other needs.    Kennth Peal, LCSW Clinical Social Worker [facility]

## 2023-06-23 NOTE — Patient Instructions (Signed)
 Magnesium Sulfate Injection What is this medication? MAGNESIUM SULFATE (mag NEE zee um SUL fate) prevents and treats low levels of magnesium in your body. It may also be used to prevent and treat seizures during pregnancy in people with high blood pressure disorders, such as preeclampsia or eclampsia. Magnesium plays an important role in maintaining the health of your muscles and nervous system. This medicine may be used for other purposes; ask your health care provider or pharmacist if you have questions. What should I tell my care team before I take this medication? They need to know if you have any of these conditions: Heart disease History of irregular heart beat Kidney disease An unusual or allergic reaction to magnesium sulfate, medications, foods, dyes, or preservatives Pregnant or trying to get pregnant Breast-feeding How should I use this medication? This medication is for infusion into a vein. It is given in a hospital or clinic setting. Talk to your care team about the use of this medication in children. While this medication may be prescribed for selected conditions, precautions do apply. Overdosage: If you think you have taken too much of this medicine contact a poison control center or emergency room at once. NOTE: This medicine is only for you. Do not share this medicine with others. What if I miss a dose? This does not apply. What may interact with this medication? Certain medications for anxiety or sleep Certain medications for seizures, such phenobarbital Digoxin Medications that relax muscles for surgery Narcotic medications for pain This list may not describe all possible interactions. Give your health care provider a list of all the medicines, herbs, non-prescription drugs, or dietary supplements you use. Also tell them if you smoke, drink alcohol, or use illegal drugs. Some items may interact with your medicine. What should I watch for while using this  medication? Your condition will be monitored carefully while you are receiving this medication. You may need blood work done while you are receiving this medication. What side effects may I notice from receiving this medication? Side effects that you should report to your care team as soon as possible: Allergic reactions--skin rash, itching, hives, swelling of the face, lips, tongue, or throat High magnesium level--confusion, drowsiness, facial flushing, redness, sweating, muscle weakness, fast or irregular heartbeat, trouble breathing Low blood pressure--dizziness, feeling faint or lightheaded, blurry vision Side effects that usually do not require medical attention (report to your care team if they continue or are bothersome): Headache Nausea This list may not describe all possible side effects. Call your doctor for medical advice about side effects. You may report side effects to FDA at 1-800-FDA-1088. Where should I keep my medication? This medication is given in a hospital or clinic and will not be stored at home. NOTE: This sheet is a summary. It may not cover all possible information. If you have questions about this medicine, talk to your doctor, pharmacist, or health care provider.  2024 Elsevier/Gold Standard (2020-09-06 00:00:00)

## 2023-06-26 ENCOUNTER — Other Ambulatory Visit: Payer: Self-pay

## 2023-06-26 DIAGNOSIS — C482 Malignant neoplasm of peritoneum, unspecified: Secondary | ICD-10-CM

## 2023-06-27 ENCOUNTER — Other Ambulatory Visit: Payer: Self-pay

## 2023-06-27 ENCOUNTER — Inpatient Hospital Stay (HOSPITAL_BASED_OUTPATIENT_CLINIC_OR_DEPARTMENT_OTHER): Admitting: Oncology

## 2023-06-27 ENCOUNTER — Encounter: Payer: Self-pay | Admitting: Oncology

## 2023-06-27 ENCOUNTER — Inpatient Hospital Stay

## 2023-06-27 VITALS — BP 154/66 | HR 82 | Temp 97.9°F | Resp 19 | Wt 279.5 lb

## 2023-06-27 DIAGNOSIS — R110 Nausea: Secondary | ICD-10-CM | POA: Diagnosis not present

## 2023-06-27 DIAGNOSIS — M542 Cervicalgia: Secondary | ICD-10-CM | POA: Insufficient documentation

## 2023-06-27 DIAGNOSIS — Z95828 Presence of other vascular implants and grafts: Secondary | ICD-10-CM | POA: Insufficient documentation

## 2023-06-27 DIAGNOSIS — C569 Malignant neoplasm of unspecified ovary: Secondary | ICD-10-CM | POA: Diagnosis not present

## 2023-06-27 DIAGNOSIS — T451X5A Adverse effect of antineoplastic and immunosuppressive drugs, initial encounter: Secondary | ICD-10-CM | POA: Insufficient documentation

## 2023-06-27 DIAGNOSIS — Z5111 Encounter for antineoplastic chemotherapy: Secondary | ICD-10-CM

## 2023-06-27 DIAGNOSIS — G629 Polyneuropathy, unspecified: Secondary | ICD-10-CM

## 2023-06-27 DIAGNOSIS — K521 Toxic gastroenteritis and colitis: Secondary | ICD-10-CM | POA: Diagnosis not present

## 2023-06-27 DIAGNOSIS — C482 Malignant neoplasm of peritoneum, unspecified: Secondary | ICD-10-CM

## 2023-06-27 LAB — CMP (CANCER CENTER ONLY)
ALT: 28 U/L (ref 0–44)
AST: 26 U/L (ref 15–41)
Albumin: 3.6 g/dL (ref 3.5–5.0)
Alkaline Phosphatase: 53 U/L (ref 38–126)
Anion gap: 9 (ref 5–15)
BUN: 29 mg/dL — ABNORMAL HIGH (ref 6–20)
CO2: 23 mmol/L (ref 22–32)
Calcium: 8.9 mg/dL (ref 8.9–10.3)
Chloride: 102 mmol/L (ref 98–111)
Creatinine: 0.97 mg/dL (ref 0.44–1.00)
GFR, Estimated: >60 mL/min (ref >60)
Glucose, Bld: 256 mg/dL — ABNORMAL HIGH (ref 70–99)
Potassium: 4.6 mmol/L (ref 3.5–5.1)
Sodium: 134 mmol/L — ABNORMAL LOW (ref 135–145)
Total Bilirubin: 0.6 mg/dL (ref 0.0–1.2)
Total Protein: 7 g/dL (ref 6.5–8.1)

## 2023-06-27 LAB — CBC WITH DIFFERENTIAL (CANCER CENTER ONLY)
Abs Immature Granulocytes: 0.08 10*3/uL — ABNORMAL HIGH (ref 0.00–0.07)
Basophils Absolute: 0.1 10*3/uL (ref 0.0–0.1)
Basophils Relative: 1 %
Eosinophils Absolute: 0.5 10*3/uL (ref 0.0–0.5)
Eosinophils Relative: 6 %
HCT: 31.7 % — ABNORMAL LOW (ref 36.0–46.0)
Hemoglobin: 10.3 g/dL — ABNORMAL LOW (ref 12.0–15.0)
Immature Granulocytes: 1 %
Lymphocytes Relative: 32 %
Lymphs Abs: 2.7 10*3/uL (ref 0.7–4.0)
MCH: 34.1 pg — ABNORMAL HIGH (ref 26.0–34.0)
MCHC: 32.5 g/dL (ref 30.0–36.0)
MCV: 105 fL — ABNORMAL HIGH (ref 80.0–100.0)
Monocytes Absolute: 0.7 10*3/uL (ref 0.1–1.0)
Monocytes Relative: 8 %
Neutro Abs: 4.4 10*3/uL (ref 1.7–7.7)
Neutrophils Relative %: 52 %
Platelet Count: 235 10*3/uL (ref 150–400)
RBC: 3.02 MIL/uL — ABNORMAL LOW (ref 3.87–5.11)
RDW: 16.7 % — ABNORMAL HIGH (ref 11.5–15.5)
WBC Count: 8.4 10*3/uL (ref 4.0–10.5)
nRBC: 0 % (ref 0.0–0.2)

## 2023-06-27 LAB — MAGNESIUM: Magnesium: 1.5 mg/dL — ABNORMAL LOW (ref 1.7–2.4)

## 2023-06-27 MED ORDER — SODIUM CHLORIDE 0.9 % IV SOLN
INTRAVENOUS | Status: DC
  Filled 2023-06-27: qty 250

## 2023-06-27 MED ORDER — MAGNESIUM SULFATE 2 GM/50ML IV SOLN
2.0000 g | Freq: Once | INTRAVENOUS | Status: AC
  Administered 2023-06-27: 2 g via INTRAVENOUS
  Filled 2023-06-27: qty 50

## 2023-06-27 MED ORDER — HEPARIN SOD (PORK) LOCK FLUSH 100 UNIT/ML IV SOLN
500.0000 [IU] | Freq: Once | INTRAVENOUS | Status: AC
  Administered 2023-06-27: 500 [IU] via INTRAVENOUS
  Filled 2023-06-27: qty 5

## 2023-06-27 NOTE — Assessment & Plan Note (Signed)
 Imodium as needed.

## 2023-06-27 NOTE — Assessment & Plan Note (Signed)
 Continue home antiemetics PRN.

## 2023-06-27 NOTE — Assessment & Plan Note (Signed)
 Plan port flush every 6 to 8 weeks if not used.

## 2023-06-27 NOTE — Progress Notes (Addendum)
 Hematology/Oncology Progress note [phone] [phone]      CHIEF COMPLAINTS  High grade serous carcinoma of ovaries.   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 Post treatment CT showed excellent response. S/p Debulking R1 on 02/20/2023, s/p 2 cycles of adjuvant carboplatin  taxol  Repeat CT results were reviewed with patient- minimal residual peritoneal thickening.  Discussed case with Gynonc Dr. Marella Shams, recommend additional 2 cycles followed by Olaparib  maintenance.  Labs are reviewed and discussed with patient. She did not tolerate Olaparib  300mg  BID due to severe diarrhea.  Symptom has significantly improved since stop of medication. Recommend patient to continue to hold medication for another 2 weeks.  Then resume on dose reduced olaparib  150 mg twice daily  Khorana score 3  discontinued Eliquis  2.5mg  BID , continue Aspirin  81mg  daily.  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  Grade 1/ intermittent neuropathy of fingertips and toes.  Continue Gabapentin  300mg  PRN  Chemotherapy-induced nausea Continue home antiemetics PRN.    Encounter for antineoplastic chemotherapy Olaparib  treatment as planned.   Chemotherapy induced diarrhea Imodium as needed.  Hypomagnesemia IV magnesium  2 g x 1 today.  Neck pain Neck pain with right upper extremity numbness Obtain MRI neck with and without contrast for further evaluation.  Port-A-Cath in place Plan port flush every 6 to 8 weeks if not used.   Follow up 3 weeks   All questions were answered. The patient knows to call the clinic with any problems, questions or concerns.  Timmy Forbes, MD, PhD [facility] Hematology Oncology 06/27/2023     HISTORY OF PRESENTING ILLNESS:  Nancy [name] 58 y.o. female presents to establish care for  peritoneal carcinomatosis I have reviewed her chart and materials related to her cancer extensively and collaborated history with the patient. 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 Aortic atherosclerosis    11/06/2022 Initial Diagnosis   Primary peritoneal carcinomatosis   -presented to emergency room for evaluation of abdominal pain/bloating, poor oral intake, nonbloody nonbilious vomiting  Patient underwent paracentesis, cytology showed malignant cells. IHC can not be added.    11/14/2022 Imaging   CT chest w contrast  1. Prominent subcentimeter right retrocrural lymph nodes, metastatic disease can not be excluded. 2. Nonspecific ground-glass nodule of the superior portion of the right lower lobe measuring 7 mm. Recommend attention on follow-up. 3. Small left pleural effusion. 4. Partially visualized large volume abdominal ascites and peritoneal thickening, volume of ascites appears increased when compared with the prior CT. 5. Coronary artery calcifications and aortic Atherosclerosis (ICD10-I70.0).   11/18/2022 Procedure   Medi port placement by IR   11/18/2022 Procedure   Omentum biopsy showed  1. Omentum, biopsy,  :   - INVOLVEMENT BY HIGH-GRADE SEROUS CARCINOMA OF GYNECOLOGIC ORIGIN.   Diagnosis Note : The carcinoma is positive for cytokeratin 7, PAX8, WT1, and ER.  Cytokeratin 20 and GATA3 are negative.  P53 is overexpressed.  The morphologic findings in conjunction with the pattern of immunohistochemistry support the  above diagnosis.   MyRIAD HRD positive  11/21/2022 -   Chemotherapy   Patient is on Treatment Plan : Carboplatin  + Paclitaxel  q21d      Genetic Testing   Pathogenic variant in BRIP1 called  p.S624* (c.1871C>A) identified on the [employer] CancerNext-Expanded+RNA panel. The report date is 11/25/2022.  The CancerNext-Expanded + RNAinsight gene panel offered by [employer] and includes sequencing and rearrangement analysis for the following 71 genes: AIP, ALK, APC*, ATM*, AXIN2, BAP1, BARD1, BMPR1A, BRCA1*, BRCA2*, BRIP1*, CDC73, CDH1*,CDK4, CDKN1B, CDKN2A, CHEK2*, CTNNA1, DICER1, FH, FLCN, KIF1B, LZTR1, MAX, MEN1, MET, MLH1*, MSH2*, MSH3, MSH6*, MUTYH*, NF1*, NF2, NTHL1, PALB2*, PHOX2B, PMS2*, POT1, PRKAR1A, PTCH1, PTEN*, RAD51C*, RAD51D*,RB1, RET, SDHA, SDHAF2, SDHB, SDHC, SDHD, SMAD4, SMARCA4, SMARCB1, SMARCE1, STK11, SUFU, TMEM127, TP53*,TSC1, TSC2, VHL; EGFR, EGLN1, HOXB13, KIT, MITF, PDGFRA, POLD1 and POLE (sequencing only); EPCAM and GREM1 (deletion/duplication only).    01/13/2023 Imaging   CT chest abdomen pelvis w contrast showed  Decreasing ascites, trace residual. The areas of omental caking and nodularity along the perineum is decreasing as well with significant residual particularly in the upper anterior abdomen.   Stable small mesenteric and retroperitoneal nodes. Resolved left pleural effusion. No developing new mass lesion. Small left renal angiomyolipoma and upper pole nonobstructing renal stone.   02/20/2023 Surgery    Interval debulking R1 - robotic assisted laparoscopic total hysterectomy, bilateral salpingo-oophorectomy, lysis of adhesions, peritoneal stripping, and omentectomy with stage IIIC high grade serous carcinoma of bilateral ovaries   A. Uterus, bilateral fallopian tubes, and bilateral ovaries, total hysterectomy and bilateral salpingo-oophorectomy: Residual high grade serous carcinoma (1.6 cm) involving bilateral ovaries, left fallopian tube, and uterine serosa.   Additional findings:  Uterus with leiomyomata (up to 1.1  cm) Right fallopian tube with no pathologic diagnosis See synoptic report.   B. Peritoneal nodules, excision: High grade serous carcinoma (up to 0.1 cm).  C. Omentum, omentectomy:  High grade serous carcinoma (0.7 cm). Moderate treatment response (CRS2).     03/04/2023 Cancer Staging   Staging form: Ovary, AJCC 7th Edition - Pathologic stage from 03/04/2023: FIGO Stage IIIC (cM0) - Signed by Timmy Forbes, MD on 03/13/2023 Stage prefix: Initial diagnosis Laterality: Bilateral   History of sepsis secondary to diabetic foot infection/osteomyelitis status post left BKA done on 09/24/2022,    Today she presents to for evaluation prior to chemotherapy. Accompanied by significant other.  Patient has tried olaparib  300 mg twice daily for 2 to 3 days and developed severe diarrhea-2015 watery BMs daily.  Decreased oral intake, weakness. Patient was seen by symptom management clinic and had supportive care.  Lab work has been held since then.  Today she feels much better since the start of medication.   MEDICAL HISTORY:  Past Medical History:  Diagnosis Date   Abnormal cardiovascular stress test 09/21/2018   Formatting of this note might be different from the original. Lexiscan Myoview 09/16/2018 revealed mild anterior ischemia   Adverse effect of motion 05/10/2014   AKI (acute kidney injury) (HCC) 10/22/2022   Anxiety    Arthritis    r knee   Asthma    Breast cyst 05/10/2014   CAD (coronary artery disease)    a.) LHC 06/04/2009 at [facility]; non-obstructive CAD. b.) CTA with FFR 10/08/2018: extensive mixed plaque proximal to mid LAD (51-69%); Coronary Ca score 217; FFR 0.71 dPDA, 0.86 mLAD, 0.87 dLCx.   CCF (congestive cardiac failure) (HCC) 06/06/2008   a.) 30% EF. b.) TTE 06/03/2011: EF >55%; triv MR. c.) TTE 04/27/2018: EF 55%; mild LVH; triv PR, mild MR/TR; G1DD.  Cellulitis of left lower extremity 09/18/2022   Chest pain with high risk for cardiac etiology 07/02/2016   Chronic use of opiate  drug for therapeutic purpose 02/13/2022   Formatting of this note might be different from the original. [facility] Pain Contract signed on 04/18/16 & updated 03/04/17; UDS done on 04/18/16.   Complication of anesthesia    Diabetic foot infection (HCC) 03/13/2015   Diabetic foot ulcer (HCC) 02/08/2021   Diabetic ulcer of left heel associated with type 2 diabetes mellitus (HCC) 02/08/2021   Eczema    Family history of adverse reaction to anesthesia    a.) PONV in mother and grandmother   Gas gangrene (HCC) 09/18/2022   GERD (gastroesophageal reflux disease) 05/10/2014   History of kidney stones    History of MSSA bacteremia due to osteomyelitis L foot 09/18/22 10/22/2022   HLD (hyperlipidemia)    Hyponatremia 03/13/2015   Migraines    Motion sickness    MSSA bacteremia 09/20/2022   Osteomyelitis (HCC) 09/18/2022   Other acute osteomyelitis, left ankle and foot (HCC) 02/13/2022   Panic attacks    Peripheral edema 04/27/2018   Pneumonia    PONV (postoperative nausea and vomiting)    Sepsis secondary to diabetic foot infection 03/13/2015   Sepsis secondary to diabetic foot infection 03/13/2015   T2DM (type 2 diabetes mellitus) (HCC)    Takotsubo cardiomyopathy / transient apical balooning syndrome / stress-induced cardiomyopathy 05/10/2014   Unspecified essential hypertension     SURGICAL HISTORY: Past Surgical History:  Procedure Laterality Date   AMPUTATION Left 09/19/2022   Procedure: AMPUTATION BELOW KNEE;  Surgeon: Jackquelyn Mass, MD;  Location: [facility];  Service: Vascular;  Laterality: Left;   AMPUTATION Left 09/24/2022   Procedure: AMPUTATION BELOW KNEE WITH WOUND CLOSURE;  Surgeon: Jackquelyn Mass, MD;  Location: [facility];  Service: Vascular;  Laterality: Left;   AMPUTATION TOE Left 03/16/2015   Procedure: left fifth toe amputation with incision and drainage;  Surgeon: Anell Baptist, DPM;  Location: [facility];  Service: Podiatry;  Laterality: Left;   APPLICATION OF WOUND VAC Left  02/14/2021   Procedure: APPLICATION OF WOUND VAC;  Surgeon: Anell Baptist, DPM;  Location: [facility];  Service: Podiatry;  Laterality: Left;   APPLICATION OF WOUND VAC Left 04/16/2021   Procedure: APPLICATION OF WOUND VAC;  Surgeon: Thornell Flirt, DO;  Location: [facility];  Service: Plastics;  Laterality: Left;   CARDIAC CATHETERIZATION Left 06/04/2009   Procedure: CARDIAC CATHETERIZATION; Location: [facility]   CHOLECYSTECTOMY     DEBRIDEMENT AND CLOSURE WOUND Left 04/16/2021   Procedure: DEBRIDEMENT AND CLOSURE WOUND;  Surgeon: Thornell Flirt, DO;  Location: [facility];  Service: Plastics;  Laterality: Left;  1 hour   IR IMAGING GUIDED PORT INSERTION  11/18/2022   IRRIGATION AND DEBRIDEMENT FOOT Left 02/08/2021   Procedure: IRRIGATION AND DEBRIDEMENT FOOT - LFT HEEL ULCER;  Surgeon: Anell Baptist, DPM;  Location: [facility];  Service: Podiatry;  Laterality: Left;   IRRIGATION AND DEBRIDEMENT FOOT Left 02/14/2021   Procedure: IRRIGATION AND DEBRIDEMENT LEFT HEEL;  Surgeon: Anell Baptist, DPM;  Location: [facility];  Service: Podiatry;  Laterality: Left;   kidney stone removal     KNEE ARTHROSCOPY W/ MENISCAL REPAIR     NOSE SURGERY  01/07/1989   due to fracture   TEE WITHOUT CARDIOVERSION N/A 09/26/2022   Procedure: TRANSESOPHAGEAL ECHOCARDIOGRAM;  Surgeon: Isabell Manzanilla, DO;  Location: [facility];  Service: Cardiovascular;  Laterality: N/A;  TOE AMPUTATION     second toe   TONSILLECTOMY     TOTAL KNEE ARTHROPLASTY  01/11/2011   Procedure: TOTAL KNEE ARTHROPLASTY;  Surgeon: Boston Byers;  Location: [facility];  Service: Orthopedics;  Laterality: Right;  COMPUTER ASSISTED TOTAL KNEE REPLACEMENT    SOCIAL HISTORY: Social History   Socioeconomic History   Marital status: Married    Spouse name: Not on file   Number of children: Not on file   Years of education: Not on file   Highest education level: Not on file  Occupational History    Not on file  Tobacco Use   Smoking status: Former    Current packs/day: 0.00    Types: Cigarettes    Quit date: 03/12/1985    Years since quitting: 38.3   Smokeless tobacco: Never  Vaping Use   Vaping status: Never Used  Substance and Sexual Activity   Alcohol use: No   Drug use: No   Sexual activity: Yes    Birth control/protection: Post-menopausal  Other Topics Concern   Not on file  Social History Narrative   Wife, Bernardo Bridgeman, at bedside. One indoor pet, dog.   Social Drivers of Corporate investment banker Strain: Not on file  Food Insecurity: No Food Insecurity (10/22/2022)   Hunger Vital Sign    Worried About Running Out of Food in the Last Year: Never true    Ran Out of Food in the Last Year: Never true  Transportation Needs: No Transportation Needs (10/22/2022)   PRAPARE - Administrator, Civil Service (Medical): No    Lack of Transportation (Non-Medical): No  Physical Activity: Not on file  Stress: Not on file  Social Connections: Not on file  Intimate Partner Violence: Not At Risk (10/22/2022)   Humiliation, Afraid, Rape, and Kick questionnaire    Fear of Current or Ex-Partner: No    Emotionally Abused: No    Physically Abused: No    Sexually Abused: No    FAMILY HISTORY: Family History  Problem Relation Age of Onset   Transient ischemic attack Mother    Asthma Mother    Hypertension Mother    Lung cancer Father 77   Hypertension Father    Asthma Sister    Anxiety disorder Sister    Asthma Sister    Hypertension Brother    Ovarian cancer Maternal Aunt 23   Melanoma Maternal Uncle        dx 39s   Lung cancer Paternal Aunt 62   Stomach cancer Maternal Grandmother    Colon cancer Maternal Grandmother    Lymphoma Maternal Grandmother    Melanoma Maternal Grandfather 72       sun exposure   Lung cancer Paternal Grandmother 59   Kidney cancer Paternal Grandfather        dx early   Crohn's disease Other    GER disease Other    Breast cancer  Neg Hx     ALLERGIES:  is allergic to shellfish allergy, codeine, morphine  and codeine, rosuvastatin, percocet [oxycodone -acetaminophen ], sulfa antibiotics, and theophyllines.  MEDICATIONS:  Current Outpatient Medications  Medication Sig Dispense Refill   acidophilus (RISAQUAD) CAPS capsule Take 1 capsule by mouth daily.     albuterol  (PROVENTIL  HFA;VENTOLIN  HFA) 108 (90 Base) MCG/ACT inhaler Inhale 2 puffs into the lungs every 4 (four) hours as needed for wheezing or shortness of breath. Reported on 03/29/2015 1 Inhaler 3   albuterol  (PROVENTIL ) (2.5 MG/3ML) 0.083% nebulizer solution Take 2.5 mg  by nebulization as needed.     ALPRAZolam  (XANAX ) 0.5 MG tablet TAKE 1/2 TO 1 TABLET BY MOUTH AS NEEDED FOR PANIC ATTACKS 30 tablet 2   ascorbic acid  (VITAMIN C ) 500 MG tablet Take 1 tablet (500 mg total) by mouth 2 (two) times daily. 60 tablet 0   aspirin  EC 81 MG tablet Take 81 mg by mouth daily.     carvedilol  (COREG ) 6.25 MG tablet TAKE 1 TABLET BY MOUTH TWICE A DAY 180 tablet 1   cholecalciferol (VITAMIN D3) 25 MCG (1000 UNIT) tablet Take 1,000 Units by mouth daily.     Continuous Glucose Sensor (FREESTYLE LIBRE 2 SENSOR) MISC 1 each by Does not apply route every 14 (fourteen) days. 2 each 3   Cyanocobalamin  (VITAMIN B 12) 500 MCG TABS Take by mouth daily at 8 pm.     desvenlafaxine (PRISTIQ) 100 MG 24 hr tablet TAKE 1 TABLET BY MOUTH EVERY DAY 90 tablet 2   dexamethasone  (DECADRON ) 4 MG tablet Take 2 tablets (8mg ) by mouth daily starting the day after carboplatin  for 2 days. Take with food 30 tablet 1   dicyclomine  (BENTYL ) 10 MG capsule Take 1 capsule (10 mg total) by mouth 4 (four) times daily -  before meals and at bedtime. 60 capsule 1   diphenoxylate-atropine (LOMOTIL) 2.5-0.025 MG tablet Take 1 tablet by mouth 4 (four) times daily as needed for diarrhea or loose stools. 30 tablet 0   Dulaglutide  (TRULICITY ) 1.5 MG/0.5ML SOAJ INJECT 1.5 MG SUBCUTANEOUSLY ONCE A WEEK 2 mL 2   Ensure Max  Protein (ENSURE MAX PROTEIN) LIQD Take 330 mLs (11 oz total) by mouth 2 (two) times daily.     fenofibrate  54 MG tablet TAKE 1 TABLET BY MOUTH EVERY DAY 30 tablet 11   gabapentin  (NEURONTIN ) 300 MG capsule TAKE 1 CAPSULE (300MG  TOTAL) BY MOUTH AT BEDTIME AS NEEDED 30 capsule 3   HYDROmorphone  (DILAUDID ) 2 MG tablet Take 1 tablet (2 mg total) by mouth every 4 (four) hours as needed for severe pain (pain score 7-10). 60 tablet 0   ibuprofen (ADVIL) 800 MG tablet TAKE 1 TABLET BY MOUTH EVERY 8 HOURS AS NEEDED 90 tablet 3   insulin  NPH-regular Human (70-30) 100 UNIT/ML injection Inject 15 Units into the skin in the morning and at bedtime.     iron  polysaccharides (NIFEREX) 150 MG capsule Take 1 capsule (150 mg total) by mouth daily. 30 capsule 2   lidocaine -prilocaine  (EMLA ) cream Apply to affected area once 30 g 3   lisinopril  (ZESTRIL ) 10 MG tablet Take 1 tablet (10 mg total) by mouth every morning AND 0.5 tablets (5 mg total) at bedtime. 45 tablet 11   lisinopril  (ZESTRIL ) 5 MG tablet Take 1 tablet (5 mg total) by mouth 2 (two) times daily. 180 tablet 1   metFORMIN  (GLUCOPHAGE ) 500 MG tablet TAKE TWO TABLETS EACH MORNING. AND 3 TABLETS IN THE EVENING AS DIRECTED 150 tablet 5   methocarbamol  (ROBAXIN ) 500 MG tablet Take 1 tablet (500 mg total) by mouth every 8 (eight) hours as needed for muscle spasms. 30 tablet 0   mirtazapine  (REMERON ) 7.5 MG tablet TAKE 1 TABLET BY MOUTH AT BEDTIME. 90 tablet 2   montelukast  (SINGULAIR ) 10 MG tablet TAKE 1 TABLET BY MOUTH EVERYDAY AT BEDTIME 30 tablet 5   MOUNJARO  5 MG/0.5ML Pen SMARTSIG:5 Milligram(s) SUB-Q Once a Week     Multiple Vitamin (MULTIVITAMIN WITH MINERALS) TABS tablet Take 1 tablet by mouth daily. 30 tablet 2  naloxone  (NARCAN ) nasal spray 4 mg/0.1 mL SPRAY 1 SPRAY INTO ONE NOSTRIL AS DIRECTED FOR OPIOID OVERDOSE (TURN PERSON ON SIDE AFTER DOSE. IF NO RESPONSE IN 2-3 MINUTES OR PERSON RESPONDS BUT RELAPSES, REPEAT USING A NEW SPRAY DEVICE AND SPRAY  INTO THE OTHER NOSTRIL. CALL 911 AFTER USE.) * EMERGENCY USE ONLY * 1 each 0   nystatin  (MYCOSTATIN /NYSTOP ) powder APPLY TOPICALLY TWICE DAILY AS NEEDED 30 g 3   nystatin  ointment (MYCOSTATIN ) Apply 1 Application topically 2 (two) times daily. 30 g 0   ondansetron  (ZOFRAN ) 8 MG tablet Take 1 tablet (8 mg total) by mouth every 8 (eight) hours as needed for nausea or vomiting. Start on the third day after carboplatin . 30 tablet 1   ondansetron  (ZOFRAN -ODT) 8 MG disintegrating tablet Take 1 tablet (8 mg total) by mouth every 8 (eight) hours as needed for nausea or vomiting. 90 tablet 3   pantoprazole  (PROTONIX ) 40 MG tablet TAKE 1 TABLET BY MOUTH EVERY DAY 90 tablet 3   pravastatin  (PRAVACHOL ) 40 MG tablet TAKE 1 TABLET BY MOUTH EVERY DAY 30 tablet 5   prochlorperazine  (COMPAZINE ) 10 MG tablet Take 1 tablet (10 mg total) by mouth every 6 (six) hours as needed for nausea or vomiting. 90 tablet 1   UBRELVY  100 MG TABS TAKE 1 TABLET (100 MG TOTAL) BY MOUTH AS NEEDED (MIGRAINE). 12 tablet 1   zinc  sulfate 220 (50 Zn) MG capsule Take 1 capsule (220 mg total) by mouth daily. 30 capsule 0   No current facility-administered medications for this visit.    Review of Systems  Constitutional:  Negative for appetite change, chills, fatigue, fever and unexpected weight change.  HENT:   Negative for hearing loss and voice change.   Eyes:  Negative for eye problems.  Respiratory:  Negative for chest tightness, cough and shortness of breath.   Cardiovascular:  Negative for chest pain and leg swelling.  Gastrointestinal:  Negative for abdominal distention, abdominal pain and blood in stool.  Endocrine: Negative for hot flashes.  Genitourinary:  Negative for difficulty urinating and frequency.   Musculoskeletal:  Negative for arthralgias.       S/p left  BKA   Skin:  Negative for itching and rash.  Neurological:  Positive for numbness. Negative for extremity weakness.  Hematological:  Negative for adenopathy.   Psychiatric/Behavioral:  Negative for confusion.      PHYSICAL EXAMINATION: ECOG PERFORMANCE STATUS: 1 - Symptomatic but completely ambulatory  Vitals:   06/27/23 0945  BP: (!) 154/66  Pulse: 82  Resp: 19  Temp: 97.9 F (36.6 C)  SpO2: 98%   Filed Weights   06/27/23 0945  Weight: 279 lb 8 oz (126.8 kg)    Physical Exam Constitutional:      General: She is not in acute distress.    Appearance: She is obese. She is not diaphoretic.  HENT:     Head: Normocephalic and atraumatic.     Mouth/Throat:     Pharynx: No oropharyngeal exudate.   Eyes:     General: No scleral icterus.   Cardiovascular:     Rate and Rhythm: Normal rate and regular rhythm.  Pulmonary:     Effort: Pulmonary effort is normal. No respiratory distress.     Breath sounds: Normal breath sounds. No wheezing.  Abdominal:     General: There is no distension.     Palpations: Abdomen is soft.     Tenderness: There is no abdominal tenderness.   Musculoskeletal:  General: Normal range of motion.     Cervical back: Normal range of motion and neck supple.     Comments: S/p left BKA   Skin:    General: Skin is warm and dry.     Findings: No erythema.   Neurological:     Mental Status: She is alert and oriented to person, place, and time. Mental status is at baseline.     Cranial Nerves: No cranial nerve deficit.     Motor: No abnormal muscle tone.   Psychiatric:        Mood and Affect: Affect normal.      LABORATORY DATA:  I have reviewed the data as listed    Latest Ref Rng & Units 06/27/2023    9:31 AM 06/23/2023    8:49 AM 06/20/2023   11:29 AM  CBC  WBC 4.0 - 10.5 K/uL 8.4  6.8  7.1   Hemoglobin 12.0 - 15.0 g/dL 40.9  9.9  81.1   Hematocrit 36.0 - 46.0 % 31.7  30.3  32.6   Platelets 150 - 400 K/uL 235  268  325       Latest Ref Rng & Units 06/27/2023    9:31 AM 06/23/2023    8:49 AM 06/20/2023   11:29 AM  CMP  Glucose 70 - 99 mg/dL 914  782  956   BUN 6 - 20 mg/dL 29  28  36    Creatinine 0.44 - 1.00 mg/dL 2.13  0.86  5.78   Sodium 135 - 145 mmol/L 134  137  134   Potassium 3.5 - 5.1 mmol/L 4.6  4.4  4.3   Chloride 98 - 111 mmol/L 102  107  106   CO2 22 - 32 mmol/L 23  21  20    Calcium 8.9 - 10.3 mg/dL 8.9  9.0  8.5   Total Protein 6.5 - 8.1 g/dL 7.0  6.8  7.3   Total Bilirubin 0.0 - 1.2 mg/dL 0.6  0.5  0.4   Alkaline Phos 38 - 126 U/L 53  44  45   AST 15 - 41 U/L 26  26  25    ALT 0 - 44 U/L 28  28  33      RADIOGRAPHIC STUDIES: I have personally reviewed the radiological images as listed and agreed with the findings in the report. No results found.

## 2023-06-27 NOTE — Assessment & Plan Note (Signed)
 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  Grade 1/ intermittent neuropathy of fingertips and toes.  Continue Gabapentin 300mg  PRN

## 2023-06-27 NOTE — Assessment & Plan Note (Signed)
 IV magnesium  2 g x 1 today.

## 2023-06-27 NOTE — Assessment & Plan Note (Addendum)
 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 Post treatment CT showed excellent response. S/p Debulking R1 on 02/20/2023, s/p 2 cycles of adjuvant carboplatin  taxol  Repeat CT results were reviewed with patient- minimal residual peritoneal thickening.  Discussed case with Gynonc Dr. Marella Shams, recommend additional 2 cycles followed by Olaparib  maintenance.  Labs are reviewed and discussed with patient. She did not tolerate Olaparib  300mg  BID due to severe diarrhea.  Symptom has significantly improved since stop of medication. Recommend patient to continue to hold medication for another 2 weeks.  Then resume on dose reduced olaparib  150 mg twice daily  Khorana score 3  discontinued Eliquis  2.5mg  BID , continue Aspirin  81mg  daily.  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

## 2023-06-27 NOTE — Assessment & Plan Note (Signed)
 Olaparib treatment as planned.

## 2023-06-27 NOTE — Assessment & Plan Note (Signed)
 Neck pain with right upper extremity numbness Obtain MRI neck with and without contrast for further evaluation.

## 2023-06-28 LAB — CA 125: Cancer Antigen (CA) 125: 6.8 U/mL (ref 0.0–38.1)

## 2023-06-30 DIAGNOSIS — S88112A Complete traumatic amputation at level between knee and ankle, left lower leg, initial encounter: Secondary | ICD-10-CM | POA: Diagnosis not present

## 2023-07-01 ENCOUNTER — Encounter: Payer: Self-pay | Admitting: Oncology

## 2023-07-03 ENCOUNTER — Inpatient Hospital Stay

## 2023-07-03 ENCOUNTER — Telehealth: Payer: Self-pay

## 2023-07-03 NOTE — Telephone Encounter (Signed)
 Patient called to let you know she has a migraine and can not make appointment today. She will call back to reschedule.

## 2023-07-03 NOTE — Progress Notes (Signed)
 [facility] CSW Progress Note  Clinical Child psychotherapist contacted patient by phone to verify meeting today at 11am to complete her Advance Directives.  Patient stated she had a migraine and needed to reschedule the appointment.  She will call CSW when she feels better.    Follow Up Plan:  Patient will contact CSW with any support or resource needs    Macario CHRISTELLA Au, LCSW Clinical Social Worker [facility]

## 2023-07-04 ENCOUNTER — Encounter: Payer: Self-pay | Admitting: Oncology

## 2023-07-07 DIAGNOSIS — C569 Malignant neoplasm of unspecified ovary: Secondary | ICD-10-CM | POA: Diagnosis not present

## 2023-07-07 DIAGNOSIS — F411 Generalized anxiety disorder: Secondary | ICD-10-CM | POA: Diagnosis not present

## 2023-07-08 ENCOUNTER — Inpatient Hospital Stay
Admission: RE | Admit: 2023-07-08 | Discharge: 2023-07-08 | Disposition: A | Discharge status: Home / Self Care | Source: Ambulatory Visit | Attending: Oncology | Admitting: Oncology

## 2023-07-08 ENCOUNTER — Encounter: Payer: Self-pay | Admitting: Oncology

## 2023-07-08 ENCOUNTER — Telehealth: Payer: Self-pay | Admitting: Pharmacy Technician

## 2023-07-08 ENCOUNTER — Other Ambulatory Visit (HOSPITAL_COMMUNITY): Payer: Self-pay

## 2023-07-08 DIAGNOSIS — C569 Malignant neoplasm of unspecified ovary: Secondary | ICD-10-CM

## 2023-07-08 MED ORDER — OLAPARIB 150 MG PO TABS: 150.0000 mg | ORAL_TABLET | Freq: Two times a day (BID) | ORAL | 1 refills | Status: DC

## 2023-07-08 NOTE — Telephone Encounter (Addendum)
 Oral Oncology Patient Advocate Encounter  After completing a benefits investigation, prior authorization for Lynparza  is not required at this time through [facility].  Patient's copay is $2,000.    Per provider's notes from 06/27/2023 She did not tolerate Olaparib  300mg  BID due to severe diarrhea.  Symptom has significantly improved since stop of medication. Recommend patient to continue to hold medication for another 2 weeks.  Then resume on dose reduced olaparib  150 mg twice daily.  Plan to call patient and ask how much medication she has on hand and to get her onboarded.  Zamyia Gowell (Patty) Chet Burnet, CPhT  [facility] [place], [place], [place], [place] Oral Chemotherapy Patient Advocate Phone: [phone]  Fax: [phone]

## 2023-07-08 NOTE — Telephone Encounter (Signed)
 Patient has new insurance and will switch to filling at [facility] (Specialty). New rx for reduced dose Lynparza  has been sent to [facility] (Specialty).

## 2023-07-08 NOTE — Telephone Encounter (Signed)
 Oral Oncology Patient Advocate Encounter  Patient called me back. I explained to her that I was planning to get her onboarded with [facility] since she has new insurance and a grant to help cover the cost.  Patient stated she has ~212 tablets on hand (one unopened #120 bottle and an opened bottle with ~#92 tabs).   I have pushed back onboarding the patient until closer to the time that she will need more medication.  Nancy [name] (Nancy [name]) Nancy [name], CPhT  [facility], [place], [place], [place] Oral Chemotherapy Patient Advocate Phone: [phone]  Fax: [phone]

## 2023-07-08 NOTE — Addendum Note (Signed)
 Addended by: RODGERS RENAEE SAILOR on: 07/08/2023 01:11 PM   Modules accepted: Orders

## 2023-07-08 NOTE — Telephone Encounter (Signed)
 Oral Oncology Patient Advocate Encounter   Was successful in securing patient an $40 grant from [employer] [facility]) to provide copayment coverage for Lynparza .  This will keep the out of pocket expense at $0.     The billing information is as follows and has been shared with [facility].   Member ID: [id number] Group ID: [id number] RxBin: [id number] Dates of Eligibility: 04/08/2023 through 07/05/2024  Fund:  Ovarian Cancer   [employer] (Patty) Chet Burnet, CPhT  [facility], [place], [place], [place] Oral Chemotherapy Patient Advocate Phone: [phone]  Fax: [phone]

## 2023-07-08 NOTE — Progress Notes (Signed)
  Date of Service: Jul 07 2023 Time of Service: 8:00 PM Discharge Plan Member has been discharged from [facility]. Please see below Discharge Plan for details. The Member has been provided a copy of their Discharge Plan via the [facility] Care portal. If you have any questions, please contact our Clinical Team at [phone].  Name Nancy Nancy [name] Date of Birth Jun 16, 1965  Discharged Reason Not Engaged  Discharging to: Referring Specialist Nancy Nancy [name]  Current Psychiatric Medications 1. Mirtazapine  (REMERON ) 7.5 MG tablet Prescribed by Nancy Mower NP 2. desvenlafaxine (PRISTIQ) 100 MG 24 hr tablet Prescribed by PCP: Nancy Nancy [name] HERO, FNP 3. ALPRAZolam  (XANAX ) 0.5 MG tablet 1. Prescribed by PCP: Nancy Nancy [name] HERO, FNP; for panic attacks  Referrals: N/A  Discharge Summary Care Delivered: Member attended multiple [facility] and Coach Visits over a 2-month period, focused on improving sleep, Nancy [name] management, and interventions for panic attacks. Interim evaluation: Member improved from initial evaluation to now - 33% reduction in PHQ-9 (PHQ-9: 15 > 10) and 43% reduction in Nancy [name] symptoms (GAD-7: 16 > 9) Nancy Care team unable to contact Member or MPOA following her February 2025 surgery. Per chart review, Member connected with [place] LCSW Nancy Nancy [name] to complete advance directives. Current status: Reason for discharge: Member disengaged / has not responded to outreach Should referring organization contact?: Unknown if Member is in need of /interested in additional support Nancy Nancy [name].   Final Intake Summary Nancy Nancy [name] is a 58 y/o white female with primary peritoneal carcinomatosis (PPC) dx'd in Oct 2024, referred by Nancy Nancy [name] @ [facility] for medication recommendations in adjustment to the patient's antidepressant/anxiolytic regimen. Nancy Nancy [name] is open to med recs. In addition to her recent cancer dx, Nancy Nancy [name] in Sep and had to go on disability. At time of CC referral, there are diagnoses of unspecified depression and mixed adjustment disorder noted in her chart. Assessments: Nancy Nancy [name] scored PHQ-9=15 (moderately severe), reporting anhedonia, low mood/energy/appetite, trouble staying asleep & sleeping too much, trouble concentrating, and feeling bad about herself every day. Safety Concerns: Nancy Nancy [name] reported several days of passive SI over the past 2 weeks, with CSSR-S LowRisk. She denied specific thoughts about method or plan and reported no prior attempts. Nancy Nancy [name] (severe), endorsing every symptom of Nancy [name] on the GAD-7, the most frequent being fear and variable worries w/o ability to control. Nancy Nancy [name] with attacks during which her heart races. Primary areas of worry include finances, medical procedures, and her health. Psych Tx/Hx: Nancy Nancy [name] is currently taking Pristiq 100mg , Xanax  0.5mg , and Gabapentin  300mg . The Pristiq was prescribed 2+ years ago and has helped with Nancy [name], however she reports needing the PRN Xanax  more frequently over the past 6 months with current stressors. The Gabapentin  is prescribed for phantom limb pain post-amputation and is PRN; she doesn't take it nightly, but notes it does help with her sleep as well. Psychosocial Hx: [name] lives with her spouse Barnie, who cares for her. 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 She is now on disability. [name] loves to cook and shop, and is supported by her wife and brother in navigating activities with her new physical limitations.  Nancy Nancy [name], [facility] Health Care Manager

## 2023-07-09 ENCOUNTER — Ambulatory Visit

## 2023-07-10 ENCOUNTER — Other Ambulatory Visit

## 2023-07-10 ENCOUNTER — Ambulatory Visit: Admitting: Family

## 2023-07-10 ENCOUNTER — Encounter: Payer: Self-pay | Admitting: Oncology

## 2023-07-10 DIAGNOSIS — E559 Vitamin D deficiency, unspecified: Secondary | ICD-10-CM

## 2023-07-10 DIAGNOSIS — E1121 Type 2 diabetes mellitus with diabetic nephropathy: Secondary | ICD-10-CM

## 2023-07-10 DIAGNOSIS — E782 Mixed hyperlipidemia: Secondary | ICD-10-CM

## 2023-07-10 DIAGNOSIS — E538 Deficiency of other specified B group vitamins: Secondary | ICD-10-CM

## 2023-07-10 DIAGNOSIS — I10 Essential (primary) hypertension: Secondary | ICD-10-CM

## 2023-07-10 DIAGNOSIS — E66813 Obesity, class 3: Secondary | ICD-10-CM

## 2023-07-11 ENCOUNTER — Encounter: Payer: Self-pay | Admitting: Family

## 2023-07-11 LAB — LIPID PANEL
Chol/HDL Ratio: 3.4 ratio (ref 0.0–4.4)
Cholesterol, Total: 138 mg/dL (ref 100–199)
HDL: 41 mg/dL (ref >39)
LDL Chol Calc (NIH): 69 mg/dL (ref 0–99)
Triglycerides: 161 mg/dL — ABNORMAL HIGH (ref 0–149)
VLDL Cholesterol Cal: 28 mg/dL (ref 5–40)

## 2023-07-11 LAB — VITAMIN B12: Vitamin B-12: 434 pg/mL (ref 232–1245)

## 2023-07-11 LAB — VITAMIN D 25 HYDROXY (VIT D DEFICIENCY, FRACTURES): Vit D, 25-Hydroxy: 24.2 ng/mL — ABNORMAL LOW (ref 30.0–100.0)

## 2023-07-11 LAB — HEMOGLOBIN A1C
Est. average glucose Bld gHb Est-mCnc: 171 mg/dL
Hgb A1c MFr Bld: 7.6 % — ABNORMAL HIGH (ref 4.8–5.6)

## 2023-07-11 NOTE — Assessment & Plan Note (Signed)
 Blood pressure well controlled with current medications.  Continue current therapy.  Will reassess at follow up.

## 2023-07-11 NOTE — Assessment & Plan Note (Signed)
 Will continue supplements as needed.

## 2023-07-11 NOTE — Assessment & Plan Note (Signed)
 Continue current diabetes POC, as patient has been well controlled on current regimen.  Will adjust meds if needed based on labs.

## 2023-07-11 NOTE — Assessment & Plan Note (Signed)
 Continue current therapy for lipid control. Will modify as needed based on labwork results.

## 2023-07-11 NOTE — Progress Notes (Signed)
 Established Patient Office Visit  Subjective:  Patient ID: Nancy [name], female    DOB: 1965-07-05  Age: 58 y.o. MRN: [id number]  Chief Complaint  Patient presents with   Follow-up    3 month follow up    Patient is here today for her 3 months follow up.  She has been feeling poorly since last appointment.   She does not have additional concerns to discuss today.  Labs are not due today, as she has multiple lab draws with Oncology recently.   She needs refills.   I have reviewed her active problem list, medication list, allergies, health maintenance, notes from last encounter, lab results for her appointment today.      No other concerns at this time.   Past Medical History:  Diagnosis Date   Abnormal cardiovascular stress test 09/21/2018   Formatting of this note might be different from the original. Lexiscan Myoview 09/16/2018 revealed mild anterior ischemia   Adverse effect of motion 05/10/2014   AKI (acute kidney injury) (HCC) 10/22/2022   Anxiety    Arthritis    r knee   Asthma    Breast cyst 05/10/2014   CAD (coronary artery disease)    a.) LHC 06/04/2009 at [facility]; non-obstructive CAD. b.) CTA with FFR 10/08/2018: extensive mixed plaque proximal to mid LAD (51-69%); Coronary Ca score 217; FFR 0.71 dPDA, 0.86 mLAD, 0.87 dLCx.   CCF (congestive cardiac failure) (HCC) 06/06/2008   a.) 30% EF. b.) TTE 06/03/2011: EF >55%; triv MR. c.) TTE 04/27/2018: EF 55%; mild LVH; triv PR, mild MR/TR; G1DD.   Cellulitis of left lower extremity 09/18/2022   Chest pain with high risk for cardiac etiology 07/02/2016   Chronic use of opiate drug for therapeutic purpose 02/13/2022   Formatting of this note might be different from the original. [facility] Pain Contract signed on 04/18/16 & updated 03/04/17; UDS done on 04/18/16.   Complication of anesthesia    Diabetic foot infection (HCC) 03/13/2015   Diabetic foot ulcer (HCC) 02/08/2021   Diabetic ulcer of left heel associated with type 2  diabetes mellitus (HCC) 02/08/2021   Eczema    Family history of adverse reaction to anesthesia    a.) PONV in mother and grandmother   Gas gangrene (HCC) 09/18/2022   GERD (gastroesophageal reflux disease) 05/10/2014   History of kidney stones    History of MSSA bacteremia due to osteomyelitis L foot 09/18/22 10/22/2022   HLD (hyperlipidemia)    Hyponatremia 03/13/2015   Migraines    Motion sickness    MSSA bacteremia 09/20/2022   Osteomyelitis (HCC) 09/18/2022   Other acute osteomyelitis, left ankle and foot (HCC) 02/13/2022   Panic attacks    Peripheral edema 04/27/2018   Pneumonia    PONV (postoperative nausea and vomiting)    Sepsis secondary to diabetic foot infection 03/13/2015   Sepsis secondary to diabetic foot infection 03/13/2015   T2DM (type 2 diabetes mellitus) (HCC)    Takotsubo cardiomyopathy / transient apical balooning syndrome / stress-induced cardiomyopathy 05/10/2014   Unspecified essential hypertension     Past Surgical History:  Procedure Laterality Date   AMPUTATION Left 09/19/2022   Procedure: AMPUTATION BELOW KNEE;  Surgeon: Jama Cordella MATSU, MD;  Location: [facility];  Service: Vascular;  Laterality: Left;   AMPUTATION Left 09/24/2022   Procedure: AMPUTATION BELOW KNEE WITH WOUND CLOSURE;  Surgeon: Jama Cordella MATSU, MD;  Location: [facility];  Service: Vascular;  Laterality: Left;   AMPUTATION TOE Left 03/16/2015  Procedure: left fifth toe amputation with incision and drainage;  Surgeon: Eva Gay, DPM;  Location: [facility];  Service: Podiatry;  Laterality: Left;   APPLICATION OF WOUND VAC Left 02/14/2021   Procedure: APPLICATION OF WOUND VAC;  Surgeon: Gay Eva, DPM;  Location: [facility];  Service: Podiatry;  Laterality: Left;   APPLICATION OF WOUND VAC Left 04/16/2021   Procedure: APPLICATION OF WOUND VAC;  Surgeon: Lowery Estefana RAMAN, DO;  Location: [facility];  Service: Plastics;  Laterality: Left;   CARDIAC CATHETERIZATION Left  06/04/2009   Procedure: CARDIAC CATHETERIZATION; Location: [facility]   CHOLECYSTECTOMY     DEBRIDEMENT AND CLOSURE WOUND Left 04/16/2021   Procedure: DEBRIDEMENT AND CLOSURE WOUND;  Surgeon: Lowery Estefana RAMAN, DO;  Location: [facility];  Service: Plastics;  Laterality: Left;  1 hour   IR IMAGING GUIDED PORT INSERTION  11/18/2022   IRRIGATION AND DEBRIDEMENT FOOT Left 02/08/2021   Procedure: IRRIGATION AND DEBRIDEMENT FOOT - LFT HEEL ULCER;  Surgeon: Gay Eva, DPM;  Location: [facility];  Service: Podiatry;  Laterality: Left;   IRRIGATION AND DEBRIDEMENT FOOT Left 02/14/2021   Procedure: IRRIGATION AND DEBRIDEMENT LEFT HEEL;  Surgeon: Gay Eva, DPM;  Location: [facility];  Service: Podiatry;  Laterality: Left;   kidney stone removal     KNEE ARTHROSCOPY W/ MENISCAL REPAIR     NOSE SURGERY  01/07/1989   due to fracture   TEE WITHOUT CARDIOVERSION N/A 09/26/2022   Procedure: TRANSESOPHAGEAL ECHOCARDIOGRAM;  Surgeon: Dewane Shiner, DO;  Location: [facility];  Service: Cardiovascular;  Laterality: N/A;   TOE AMPUTATION     second toe   TONSILLECTOMY     TOTAL KNEE ARTHROPLASTY  01/11/2011   Procedure: TOTAL KNEE ARTHROPLASTY;  Surgeon: Norleen LITTIE Gavel;  [facility]: [facility];  Service: Orthopedics;  Laterality: Right;  COMPUTER ASSISTED TOTAL KNEE REPLACEMENT    Social History   Socioeconomic History   Marital status: Married    Spouse name: Not on file   Number of children: Not on file   Years of education: Not on file   Highest education level: Not on file  Occupational History   Not on file  Tobacco Use   Smoking status: Former    Current packs/day: 0.00    Types: Cigarettes    Quit date: 03/12/1985    Years since quitting: 38.3   Smokeless tobacco: Never  Vaping Use   Vaping status: Never Used  Substance and Sexual Activity   Alcohol use: No   Drug use: No   Sexual activity: Yes    Birth control/protection: Post-menopausal  Other Topics Concern    Not on file  Social History Narrative   Wife, Barnie, at bedside. One indoor pet, dog.   Social Drivers of Corporate investment banker Strain: Not on file  Food Insecurity: No Food Insecurity (10/22/2022)   Hunger Vital Sign    Worried About Running Out of Food in the Last Year: Never true    Ran Out of Food in the Last Year: Never true  Transportation Needs: No Transportation Needs (10/22/2022)   PRAPARE - Administrator, Civil Service (Medical): No    Lack of Transportation (Non-Medical): No  Physical Activity: Not on file  Stress: Not on file  Social Connections: Not on file  Intimate Partner Violence: Not At Risk (10/22/2022)   Humiliation, Afraid, Rape, and Kick questionnaire    Fear of Current or Ex-Partner: No    Emotionally Abused:  No    Physically Abused: No    Sexually Abused: No    Family History  Problem Relation Age of Onset   Transient ischemic attack Mother    Asthma Mother    Hypertension Mother    Lung cancer Father 26   Hypertension Father    Asthma Sister    Anxiety disorder Sister    Asthma Sister    Hypertension Brother    Ovarian cancer Maternal Aunt 7   Melanoma Maternal Uncle        dx 69s   Lung cancer Paternal Aunt 76   Stomach cancer Maternal Grandmother    Colon cancer Maternal Grandmother    Lymphoma Maternal Grandmother    Melanoma Maternal Grandfather 72       sun exposure   Lung cancer Paternal Grandmother 51   Kidney cancer Paternal Grandfather        dx early   Crohn's disease Other    GER disease Other    Breast cancer Neg Hx     Allergies  Allergen Reactions   Shellfish Allergy Anaphylaxis   Codeine Other (See Comments)    Migraine  Other Reaction(s): Other (See Comments)  Reaction:  Severe migraines   Morphine  And Codeine Other (See Comments)    Reaction:  Severe migraines    Rosuvastatin     Other reaction(s): Muscle Pain   Percocet [Oxycodone -Acetaminophen ] Nausea And Vomiting   Sulfa  Antibiotics Rash   Theophyllines Itching and Rash    Review of Systems  Constitutional:  Positive for malaise/fatigue.  Gastrointestinal:  Positive for nausea.  All other systems reviewed and are negative.      Objective:   BP (!) 142/82   Pulse 82   Ht 5' 9 (1.753 m)   Wt 265 lb 6.4 oz (120.4 kg)   LMP 02/08/2013 (Within Months)   SpO2 98%   BMI 39.19 kg/m   Vitals:   04/10/23 1053  BP: (!) 142/82  Pulse: 82  Height: 5' 9 (1.753 m)  Weight: 265 lb 6.4 oz (120.4 kg)  SpO2: 98%  BMI (Calculated): 39.17    Physical Exam Vitals and nursing note reviewed.  Constitutional:      General: She is not in acute distress.    Appearance: Normal appearance. She is obese. She is ill-appearing. She is not toxic-appearing.  HENT:     Head: Normocephalic.  Eyes:     Extraocular Movements: Extraocular movements intact.     Conjunctiva/sclera: Conjunctivae normal.     Pupils: Pupils are equal, round, and reactive to light.  Cardiovascular:     Rate and Rhythm: Normal rate.  Pulmonary:     Effort: Pulmonary effort is normal.  Neurological:     General: No focal deficit present.     Mental Status: She is alert and oriented to person, place, and time. Mental status is at baseline.     Motor: Weakness present.  Psychiatric:        Mood and Affect: Mood normal.        Behavior: Behavior normal.        Thought Content: Thought content normal.        Judgment: Judgment normal.      No results found for any visits on 04/10/23.  Recent Results (from the past 2160 hours)  CBC with Differential (Cancer Center Only)     Status: Abnormal   Collection Time: 04/24/23  7:58 AM  Result Value Ref Range   WBC Count 5.5  4.0 - 10.5 K/uL   RBC 2.94 (L) 3.87 - 5.11 MIL/uL   Hemoglobin 10.2 (L) 12.0 - 15.0 g/dL   HCT 68.9 (L) 63.9 - 53.9 %   MCV 105.4 (H) 80.0 - 100.0 fL   MCH 34.7 (H) 26.0 - 34.0 pg   MCHC 32.9 30.0 - 36.0 g/dL   RDW 85.6 88.4 - 84.4 %   Platelet Count 160 150 -  400 K/uL   nRBC 0.0 0.0 - 0.2 %   Neutrophils Relative % 40 %   Neutro Abs 2.2 1.7 - 7.7 K/uL   Lymphocytes Relative 47 %   Lymphs Abs 2.6 0.7 - 4.0 K/uL   Monocytes Relative 10 %   Monocytes Absolute 0.5 0.1 - 1.0 K/uL   Eosinophils Relative 1 %   Eosinophils Absolute 0.1 0.0 - 0.5 K/uL   Basophils Relative 1 %   Basophils Absolute 0.1 0.0 - 0.1 K/uL   Immature Granulocytes 1 %   Abs Immature Granulocytes 0.04 0.00 - 0.07 K/uL    Comment: Performed at [facility], [address] [place], [place] [address]  CA 125     Status: None   Collection Time: 04/24/23  7:58 AM  Result Value Ref Range   Cancer Antigen (CA) 125 7.7 0.0 - 38.1 U/mL    Comment: (NOTE) Roche Diagnostics Electrochemiluminescence Immunoassay (ECLIA) Values obtained with different assay methods or kits cannot be used interchangeably.  Results cannot be interpreted as absolute evidence of the presence or absence of malignant disease. Performed At: [facility] [address] [place], [place] [id number] Jennette Shorter MD Ey:[phone]   CMP (Cancer Center only)     Status: Abnormal   Collection Time: 04/24/23  7:58 AM  Result Value Ref Range   Sodium 138 135 - 145 mmol/L   Potassium 3.8 3.5 - 5.1 mmol/L   Chloride 103 98 - 111 mmol/L   CO2 24 22 - 32 mmol/L   Glucose, Bld 170 (H) 70 - 99 mg/dL    Comment: Glucose reference range applies only to samples taken after fasting for at least 8 hours.   BUN 28 (H) 6 - 20 mg/dL   Creatinine 8.98 (H) 9.55 - 1.00 mg/dL   Calcium 8.9 8.9 - 89.6 mg/dL   Total Protein 6.9 6.5 - 8.1 g/dL   Albumin 3.8 3.5 - 5.0 g/dL   AST 23 15 - 41 U/L   ALT 23 0 - 44 U/L   Alkaline Phosphatase 49 38 - 126 U/L   Total Bilirubin 0.4 0.0 - 1.2 mg/dL   GFR, Estimated >39 >39 mL/min    Comment: (NOTE) Calculated using the CKD-EPI Creatinine Equation (2021)    Anion gap 11 5 - 15    Comment: Performed at [facility], [address] [place], [place] [address]   CMP (Cancer Center only)     Status: Abnormal   Collection Time: 05/15/23  8:05 AM  Result Value Ref Range   Sodium 136 135 - 145 mmol/L   Potassium 4.2 3.5 - 5.1 mmol/L   Chloride 102 98 - 111 mmol/L   CO2 21 (L) 22 - 32 mmol/L   Glucose, Bld 235 (H) 70 - 99 mg/dL    Comment: Glucose reference range applies only to samples taken after fasting for at least 8 hours.   BUN 28 (H) 6 - 20 mg/dL   Creatinine 8.97 (H) 9.55 - 1.00 mg/dL   Calcium 8.7 (L) 8.9 - 10.3 mg/dL  Total Protein 6.9 6.5 - 8.1 g/dL   Albumin 3.5 3.5 - 5.0 g/dL   AST 26 15 - 41 U/L   ALT 32 0 - 44 U/L   Alkaline Phosphatase 50 38 - 126 U/L   Total Bilirubin 0.4 0.0 - 1.2 mg/dL   GFR, Estimated >39 >39 mL/min    Comment: (NOTE) Calculated using the CKD-EPI Creatinine Equation (2021)    Anion gap 13 5 - 15    Comment: Performed at [facility], [address] [place], [place] [address]  CA 125     Status: None   Collection Time: 05/15/23  8:05 AM  Result Value Ref Range   Cancer Antigen (CA) 125 8.7 0.0 - 38.1 U/mL    Comment: (NOTE) Roche Diagnostics Electrochemiluminescence Immunoassay (ECLIA) Values obtained with different assay methods or kits cannot be used interchangeably.  Results cannot be interpreted as absolute evidence of the presence or absence of malignant disease. Performed At: [facility] [address] [place], [place] [id number] Jennette Shorter MD Ey:[phone]   CBC with Differential (Cancer Center Only)     Status: Abnormal   Collection Time: 05/15/23  8:05 AM  Result Value Ref Range   WBC Count 6.6 4.0 - 10.5 K/uL   RBC 2.92 (L) 3.87 - 5.11 MIL/uL   Hemoglobin 10.2 (L) 12.0 - 15.0 g/dL   HCT 69.6 (L) 63.9 - 53.9 %   MCV 103.8 (H) 80.0 - 100.0 fL   MCH 34.9 (H) 26.0 - 34.0 pg   MCHC 33.7 30.0 - 36.0 g/dL   RDW 85.2 88.4 - 84.4 %   Platelet Count 119 (L) 150 - 400 K/uL   nRBC 0.5 (H) 0.0 - 0.2 %   Neutrophils Relative % 39 %   Neutro Abs 2.6 1.7 - 7.7 K/uL    Lymphocytes Relative 46 %   Lymphs Abs 3.1 0.7 - 4.0 K/uL   Monocytes Relative 12 %   Monocytes Absolute 0.8 0.1 - 1.0 K/uL   Eosinophils Relative 1 %   Eosinophils Absolute 0.0 0.0 - 0.5 K/uL   Basophils Relative 1 %   Basophils Absolute 0.1 0.0 - 0.1 K/uL   Immature Granulocytes 1 %   Abs Immature Granulocytes 0.07 0.00 - 0.07 K/uL    Comment: Performed at [facility], [address] [place], [place] [address]  Retic Panel     Status: Abnormal   Collection Time: 06/06/23  7:56 AM  Result Value Ref Range   Retic Ct Pct 4.0 (H) 0.4 - 3.1 %   RBC. 2.83 (L) 3.87 - 5.11 MIL/uL   Retic Count, Absolute 111.8 19.0 - 186.0 K/uL   Immature Retic Fract 32.7 (H) 2.3 - 15.9 %   Reticulocyte Hemoglobin 35.7 >27.9 pg    Comment:        Given the high negative predictive value of a RET-He result > 32 pg iron  deficiency is essentially excluded. If this patient is anemic other etiologies should be considered. Performed at [facility], [address] [place], [place] [address]   CMP (Cancer Center only)     Status: Abnormal   Collection Time: 06/06/23  7:56 AM  Result Value Ref Range   Sodium 135 135 - 145 mmol/L   Potassium 4.5 3.5 - 5.1 mmol/L   Chloride 104 98 - 111 mmol/L   CO2 22 22 - 32 mmol/L   Glucose, Bld 220 (H) 70 - 99 mg/dL    Comment: Glucose reference range applies  only to samples taken after fasting for at least 8 hours.   BUN 30 (H) 6 - 20 mg/dL   Creatinine 8.89 (H) 9.55 - 1.00 mg/dL   Calcium 8.7 (L) 8.9 - 10.3 mg/dL   Total Protein 6.8 6.5 - 8.1 g/dL   Albumin 3.5 3.5 - 5.0 g/dL   AST 26 15 - 41 U/L   ALT 30 0 - 44 U/L   Alkaline Phosphatase 42 38 - 126 U/L   Total Bilirubin 0.3 0.0 - 1.2 mg/dL   GFR, Estimated 58 (L) >60 mL/min    Comment: (NOTE) Calculated using the CKD-EPI Creatinine Equation (2021)    Anion gap 9 5 - 15    Comment: Performed at [facility], [address] [place], [place] [address]  CBC with Differential (Cancer  Center Only)     Status: Abnormal   Collection Time: 06/06/23  7:56 AM  Result Value Ref Range   WBC Count 5.8 4.0 - 10.5 K/uL   RBC 2.77 (L) 3.87 - 5.11 MIL/uL   Hemoglobin 9.5 (L) 12.0 - 15.0 g/dL   HCT 71.4 (L) 63.9 - 53.9 %   MCV 102.9 (H) 80.0 - 100.0 fL   MCH 34.3 (H) 26.0 - 34.0 pg   MCHC 33.3 30.0 - 36.0 g/dL   RDW 83.5 (H) 88.4 - 84.4 %   Platelet Count 142 (L) 150 - 400 K/uL   nRBC 0.0 0.0 - 0.2 %   Neutrophils Relative % 43 %   Neutro Abs 2.5 1.7 - 7.7 K/uL   Lymphocytes Relative 42 %   Lymphs Abs 2.4 0.7 - 4.0 K/uL   Monocytes Relative 12 %   Monocytes Absolute 0.7 0.1 - 1.0 K/uL   Eosinophils Relative 1 %   Eosinophils Absolute 0.0 0.0 - 0.5 K/uL   Basophils Relative 1 %   Basophils Absolute 0.1 0.0 - 0.1 K/uL   Immature Granulocytes 1 %   Abs Immature Granulocytes 0.08 (H) 0.00 - 0.07 K/uL    Comment: Performed at [facility], [address] [place], [place] [address]  Ferritin     Status: None   Collection Time: 06/06/23  7:56 AM  Result Value Ref Range   Ferritin 84 11 - 307 ng/mL    Comment: Performed at [facility], [address] [place], [place] [address]  Iron  and TIBC     Status: None   Collection Time: 06/06/23  7:56 AM  Result Value Ref Range   Iron  89 28 - 170 ug/dL   TIBC 579 749 - 549 ug/dL   Saturation Ratios 21 10.4 - 31.8 %   UIBC 331 ug/dL    Comment: Performed at [facility], [address] [place], [place] [address]  [place] 125     Status: None   Collection Time: 06/06/23  7:56 AM  Result Value Ref Range   Cancer Antigen (CA) 125 8.1 0.0 - 38.1 U/mL    Comment: (NOTE) Roche Diagnostics Electrochemiluminescence Immunoassay (ECLIA) Values obtained with different assay methods or kits cannot be used interchangeably.  Results cannot be interpreted as absolute evidence of the presence or absence of malignant disease. Performed At: [facility] [address] [place], [place] [id number] Jennette Shorter MD  Ey:[phone]   Magnesium      Status: Abnormal   Collection Time: 06/20/23 11:29 AM  Result Value Ref Range   Magnesium  1.5 (L) 1.7 - 2.4 mg/dL    Comment: Performed at [facility], [address] [place], [place] [address]  CMP (Cancer Center only)     Status: Abnormal   Collection Time: 06/20/23 11:29 AM  Result Value Ref Range   Sodium 134 (L) 135 - 145 mmol/L   Potassium 4.3 3.5 - 5.1 mmol/L   Chloride 106 98 - 111 mmol/L   CO2 20 (L) 22 - 32 mmol/L   Glucose, Bld 204 (H) 70 - 99 mg/dL    Comment: Glucose reference range applies only to samples taken after fasting for at least 8 hours.   BUN 36 (H) 6 - 20 mg/dL   Creatinine 8.67 (H) 9.55 - 1.00 mg/dL   Calcium 8.5 (L) 8.9 - 10.3 mg/dL   Total Protein 7.3 6.5 - 8.1 g/dL   Albumin 3.8 3.5 - 5.0 g/dL   AST 25 15 - 41 U/L   ALT 33 0 - 44 U/L   Alkaline Phosphatase 45 38 - 126 U/L   Total Bilirubin 0.4 0.0 - 1.2 mg/dL   GFR, Estimated 47 (L) >60 mL/min    Comment: (NOTE) Calculated using the CKD-EPI Creatinine Equation (2021)    Anion gap 8 5 - 15    Comment: Performed at [facility], [address] [place], [place] [address]  CBC with Differential (Cancer Center Only)     Status: Abnormal   Collection Time: 06/20/23 11:29 AM  Result Value Ref Range   WBC Count 7.1 4.0 - 10.5 K/uL   RBC 3.14 (L) 3.87 - 5.11 MIL/uL   Hemoglobin 10.5 (L) 12.0 - 15.0 g/dL   HCT 67.3 (L) 63.9 - 53.9 %   MCV 103.8 (H) 80.0 - 100.0 fL   MCH 33.4 26.0 - 34.0 pg   MCHC 32.2 30.0 - 36.0 g/dL   RDW 83.1 (H) 88.4 - 84.4 %   Platelet Count 325 150 - 400 K/uL   nRBC 0.3 (H) 0.0 - 0.2 %   Neutrophils Relative % 56 %   Neutro Abs 3.9 1.7 - 7.7 K/uL   Lymphocytes Relative 26 %   Lymphs Abs 1.9 0.7 - 4.0 K/uL   Monocytes Relative 9 %   Monocytes Absolute 0.6 0.1 - 1.0 K/uL   Eosinophils Relative 9 %   Eosinophils Absolute 0.7 (H) 0.0 - 0.5 K/uL   Basophils Relative 0 %   Basophils Absolute 0.0 0.0 - 0.1 K/uL   Immature  Granulocytes 0 %   Abs Immature Granulocytes 0.03 0.00 - 0.07 K/uL    Comment: Performed at [facility], [address] [place], [place] [address]  Gastrointestinal Panel by PCR , Stool     Status: None   Collection Time: 06/20/23 12:22 PM   Specimen: Stool  Result Value Ref Range   Campylobacter species NOT DETECTED NOT DETECTED   Plesimonas shigelloides NOT DETECTED NOT DETECTED   Salmonella species NOT DETECTED NOT DETECTED   Yersinia enterocolitica NOT DETECTED NOT DETECTED   Vibrio species NOT DETECTED NOT DETECTED   Vibrio cholerae NOT DETECTED NOT DETECTED   Enteroaggregative E coli (EAEC) NOT DETECTED NOT DETECTED   Enteropathogenic E coli (EPEC) NOT DETECTED NOT DETECTED   Enterotoxigenic E coli (ETEC) NOT DETECTED NOT DETECTED   Shiga like toxin producing E coli (STEC) NOT DETECTED NOT DETECTED   Shigella/Enteroinvasive E coli (EIEC) NOT DETECTED NOT DETECTED   Cryptosporidium NOT DETECTED NOT DETECTED   Cyclospora cayetanensis NOT DETECTED NOT DETECTED   Entamoeba histolytica NOT DETECTED NOT DETECTED   Giardia lamblia NOT DETECTED NOT DETECTED  Adenovirus F40/41 NOT DETECTED NOT DETECTED   Astrovirus NOT DETECTED NOT DETECTED   Norovirus GI/GII NOT DETECTED NOT DETECTED   Rotavirus A NOT DETECTED NOT DETECTED   Sapovirus (I, II, IV, and V) NOT DETECTED NOT DETECTED    Comment: Performed at [facility], [address] [place], [place] [address]  C difficile quick screen w PCR reflex     Status: None   Collection Time: 06/20/23 12:29 PM   Specimen: STOOL  Result Value Ref Range   C Diff antigen NEGATIVE NEGATIVE   C Diff toxin NEGATIVE NEGATIVE   C Diff interpretation No C. difficile detected.     Comment: Performed at [facility], [address] [place], [place] [address]  Magnesium      Status: Abnormal   Collection Time: 06/23/23  8:49 AM  Result Value Ref Range   Magnesium  1.5 (L) 1.7 - 2.4 mg/dL    Comment: Performed at [facility], [address] [place], [place] [address]  CMP (Cancer Center only)     Status: Abnormal   Collection Time: 06/23/23  8:49 AM  Result Value Ref Range   Sodium 137 135 - 145 mmol/L   Potassium 4.4 3.5 - 5.1 mmol/L   Chloride 107 98 - 111 mmol/L   CO2 21 (L) 22 - 32 mmol/L   Glucose, Bld 157 (H) 70 - 99 mg/dL    Comment: Glucose reference range applies only to samples taken after fasting for at least 8 hours.   BUN 28 (H) 6 - 20 mg/dL   Creatinine 9.08 9.55 - 1.00 mg/dL   Calcium 9.0 8.9 - 89.6 mg/dL   Total Protein 6.8 6.5 - 8.1 g/dL   Albumin 3.7 3.5 - 5.0 g/dL   AST 26 15 - 41 U/L   ALT 28 0 - 44 U/L   Alkaline Phosphatase 44 38 - 126 U/L   Total Bilirubin 0.5 0.0 - 1.2 mg/dL   GFR, Estimated >39 >39 mL/min    Comment: (NOTE) Calculated using the CKD-EPI Creatinine Equation (2021)    Anion gap 9 5 - 15    Comment: Performed at [facility] [address] [place], [place] [address]  CBC with Differential (Cancer Center Only)     Status: Abnormal   Collection Time: 06/23/23  8:49 AM  Result Value Ref Range   WBC Count 6.8 4.0 - 10.5 K/uL   RBC 2.92 (L) 3.87 - 5.11 MIL/uL   Hemoglobin 9.9 (L) 12.0 - 15.0 g/dL   HCT 69.6 (L) 63.9 - 53.9 %   MCV 103.8 (H) 80.0 - 100.0 fL   MCH 33.9 26.0 - 34.0 pg   MCHC 32.7 30.0 - 36.0 g/dL   RDW 83.4 (H) 88.4 - 84.4 %   Platelet Count 268 150 - 400 K/uL   nRBC 0.3 (H) 0.0 - 0.2 %   Neutrophils Relative % 46 %   Neutro Abs 3.1 1.7 - 7.7 K/uL   Lymphocytes Relative 35 %   Lymphs Abs 2.4 0.7 - 4.0 K/uL   Monocytes Relative 9 %   Monocytes Absolute 0.6 0.1 - 1.0 K/uL   Eosinophils Relative 8 %   Eosinophils Absolute 0.6 (H) 0.0 - 0.5 K/uL   Basophils Relative 1 %   Basophils Absolute 0.1 0.0 - 0.1 K/uL   Immature Granulocytes 1 %   Abs Immature Granulocytes 0.07 0.00 - 0.07 K/uL    Comment: Performed at [facility], [address]  [place], [place] [address]  [place] 125     Status: None   Collection Time:  06/27/23  9:31 AM  Result Value Ref Range   Cancer Antigen (CA) 125 6.8 0.0 - 38.1 U/mL    Comment: (NOTE) Roche [employer]ostics Electrochemiluminescence Immunoassay (ECLIA) Values obtained with different assay methods or kits cannot be used interchangeably.  Results cannot be interpreted as absolute evidence of the presence or absence of malignant disease. Performed At: [facility] [address] [place], [place] [id number] Jennette Shorter MD Ey:[phone]   CBC with Differential (Cancer Center Only)     Status: Abnormal   Collection Time: 06/27/23  9:31 AM  Result Value Ref Range   WBC Count 8.4 4.0 - 10.5 K/uL   RBC 3.02 (L) 3.87 - 5.11 MIL/uL   Hemoglobin 10.3 (L) 12.0 - 15.0 g/dL   HCT 68.2 (L) 63.9 - 53.9 %   MCV 105.0 (H) 80.0 - 100.0 fL   MCH 34.1 (H) 26.0 - 34.0 pg   MCHC 32.5 30.0 - 36.0 g/dL   RDW 83.2 (H) 88.4 - 84.4 %   Platelet Count 235 150 - 400 K/uL   nRBC 0.0 0.0 - 0.2 %   Neutrophils Relative % 52 %   Neutro Abs 4.4 1.7 - 7.7 K/uL   Lymphocytes Relative 32 %   Lymphs Abs 2.7 0.7 - 4.0 K/uL   Monocytes Relative 8 %   Monocytes Absolute 0.7 0.1 - 1.0 K/uL   Eosinophils Relative 6 %   Eosinophils Absolute 0.5 0.0 - 0.5 K/uL   Basophils Relative 1 %   Basophils Absolute 0.1 0.0 - 0.1 K/uL   Immature Granulocytes 1 %   Abs Immature Granulocytes 0.08 (H) 0.00 - 0.07 K/uL    Comment: Performed at [facility], [address] [place], [place] [address]  CMP (Cancer Center only)     Status: Abnormal   Collection Time: 06/27/23  9:31 AM  Result Value Ref Range   Sodium 134 (L) 135 - 145 mmol/L   Potassium 4.6 3.5 - 5.1 mmol/L   Chloride 102 98 - 111 mmol/L   CO2 23 22 - 32 mmol/L   Glucose, Bld 256 (H) 70 - 99 mg/dL    Comment: Glucose reference range applies only to samples taken after fasting for at least 8 hours.   BUN 29 (H) 6 - 20 mg/dL   Creatinine 9.02 9.55 - 1.00 mg/dL   Calcium 8.9 8.9 - 89.6 mg/dL   Total Protein 7.0 6.5 - 8.1 g/dL    Albumin 3.6 3.5 - 5.0 g/dL   AST 26 15 - 41 U/L   ALT 28 0 - 44 U/L   Alkaline Phosphatase 53 38 - 126 U/L   Total Bilirubin 0.6 0.0 - 1.2 mg/dL   GFR, Estimated >39 >39 mL/min    Comment: (NOTE) Calculated using the CKD-EPI Creatinine Equation (2021)    Anion gap 9 5 - 15    Comment: Performed at [facility], [address], [place], [place] [address]  Magnesium      Status: Abnormal   Collection Time: 06/27/23  9:31 AM  Result Value Ref Range   Magnesium  1.5 (L) 1.7 - 2.4 mg/dL    Comment: Performed at [facility], [address] [place], [place] [address]  Lipid Profile     Status: Abnormal   Collection Time: 07/10/23  2:46 PM  Result Value Ref Range   Cholesterol, Total 138 100 - 199 mg/dL  Triglycerides 161 (H) 0 - 149 mg/dL   HDL 41 >60 mg/dL   VLDL Cholesterol Cal 28 5 - 40 mg/dL   LDL Chol Calc (NIH) 69 0 - 99 mg/dL   Chol/HDL Ratio 3.4 0.0 - 4.4 ratio    Comment:                                   T. Chol/HDL Ratio                                             Men  Women                               1/2 Avg.Risk  3.4    3.3                                   Avg.Risk  5.0    4.4                                2X Avg.Risk  9.6    7.1                                3X Avg.Risk 23.4   11.0   Hemoglobin A1c     Status: Abnormal   Collection Time: 07/10/23  2:46 PM  Result Value Ref Range   Hgb A1c MFr Bld 7.6 (H) 4.8 - 5.6 %    Comment:          Prediabetes: 5.7 - 6.4          Diabetes: >6.4          Glycemic control for adults with diabetes: <7.0    Est. average glucose Bld gHb Est-mCnc 171 mg/dL  Vitamin B12     Status: None   Collection Time: 07/10/23  2:46 PM  Result Value Ref Range   Vitamin B-12 434 232 - 1,245 pg/mL  Vitamin D  (25 hydroxy)     Status: Abnormal   Collection Time: 07/10/23  2:46 PM  Result Value Ref Range   Vit D, 25-Hydroxy 24.2 (L) 30.0 - 100.0 ng/mL    Comment: Vitamin D  deficiency has been defined by the Institute  of Medicine and an Endocrine Society practice guideline as a level of serum 25-OH vitamin D  less than 20 ng/mL (1,2). The Endocrine Society went on to further define vitamin D  insufficiency as a level between 21 and 29 ng/mL (2). 1. IOM (Institute of Medicine). 2010. Dietary reference    intakes for calcium and D. [place]: The    [employer]. 2. Holick MF, Binkley Enlow, Bischoff-Ferrari HA, et al.    Evaluation, treatment, and prevention of vitamin D     deficiency: an Endocrine Society clinical practice    guideline. JCEM. 2011 Jul; 96(7):1911-30.        Assessment & Plan Essential hypertension Blood pressure well controlled with current medications.  Continue current therapy.  Will reassess at follow up.   Type 2 diabetes mellitus with hyperglycemia, with long-term current use of  insulin  (HCC) Continue current diabetes POC, as patient has been well controlled on current regimen.  Will adjust meds if needed based on labs.   S/P left  BKA 09/24/22 secondary to osteomyelitis (below knee amputation) (HCC) Patient stable.  Well controlled with current therapy.   Continue current meds.   Mixed hyperlipidemia Continue current therapy for lipid control. Will modify as needed based on labwork results.   Vitamin D  deficiency, unspecified B12 deficiency due to diet Will continue supplements as needed.   Obesity, Class III, BMI 40-49.9 (morbid obesity) Continue current meds.  Will adjust as needed based on results.  The patient is asked to make an attempt to improve diet and exercise patterns to aid in medical management of this problem. Addressed importance of increasing and maintaining water  intake.     Return in about 3 months (around 07/10/2023) for F/U.   Total time spent: 20 minutes  ALAN CHRISTELLA ARRANT, FNP  04/10/2023   This document may have been prepared by [facility] Voice Recognition software and as such may include unintentional dictation errors.

## 2023-07-11 NOTE — Assessment & Plan Note (Signed)
 Continue current meds.  Will adjust as needed based on results.  The patient is asked to make an attempt to improve diet and exercise patterns to aid in medical management of this problem. Addressed importance of increasing and maintaining water intake.

## 2023-07-11 NOTE — Assessment & Plan Note (Signed)
 Patient stable.  Well controlled with current therapy.   Continue current meds.

## 2023-07-14 ENCOUNTER — Encounter: Payer: Self-pay | Admitting: Oncology

## 2023-07-14 ENCOUNTER — Ambulatory Visit

## 2023-07-14 ENCOUNTER — Ambulatory Visit: Payer: Self-pay

## 2023-07-16 ENCOUNTER — Encounter: Payer: Self-pay | Admitting: Oncology

## 2023-07-16 ENCOUNTER — Inpatient Hospital Stay: Payer: Self-pay | Attending: Oncology | Admitting: Obstetrics and Gynecology

## 2023-07-16 VITALS — BP 150/56 | HR 75 | Temp 96.8°F | Resp 19 | Wt 281.2 lb

## 2023-07-16 DIAGNOSIS — Z9221 Personal history of antineoplastic chemotherapy: Secondary | ICD-10-CM | POA: Diagnosis not present

## 2023-07-16 DIAGNOSIS — Z90710 Acquired absence of both cervix and uterus: Secondary | ICD-10-CM | POA: Insufficient documentation

## 2023-07-16 DIAGNOSIS — Z79899 Other long term (current) drug therapy: Secondary | ICD-10-CM | POA: Insufficient documentation

## 2023-07-16 DIAGNOSIS — Z7982 Long term (current) use of aspirin: Secondary | ICD-10-CM | POA: Insufficient documentation

## 2023-07-16 DIAGNOSIS — Z1509 Genetic susceptibility to other malignant neoplasm: Secondary | ICD-10-CM | POA: Insufficient documentation

## 2023-07-16 DIAGNOSIS — Z90722 Acquired absence of ovaries, bilateral: Secondary | ICD-10-CM | POA: Insufficient documentation

## 2023-07-16 DIAGNOSIS — C786 Secondary malignant neoplasm of retroperitoneum and peritoneum: Secondary | ICD-10-CM | POA: Diagnosis not present

## 2023-07-16 DIAGNOSIS — Z9079 Acquired absence of other genital organ(s): Secondary | ICD-10-CM | POA: Diagnosis not present

## 2023-07-16 DIAGNOSIS — Z148 Genetic carrier of other disease: Secondary | ICD-10-CM | POA: Insufficient documentation

## 2023-07-16 DIAGNOSIS — C569 Malignant neoplasm of unspecified ovary: Secondary | ICD-10-CM

## 2023-07-16 DIAGNOSIS — B372 Candidiasis of skin and nail: Secondary | ICD-10-CM | POA: Insufficient documentation

## 2023-07-16 DIAGNOSIS — Z8543 Personal history of malignant neoplasm of ovary: Secondary | ICD-10-CM | POA: Diagnosis not present

## 2023-07-16 NOTE — Progress Notes (Signed)
 Gynecologic Oncology follow up Visit   Referring Provider:  Dr. 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 G0 female with high grade serous ovarian cancer  Returns today for follow up. She was started on olaparib  300mg  BID on June 6th, 2025 but due to severe diarrhea had a treatment hold after just one week.  Symptom has significantly improved since stop of medication.Recommend patient to continue to hold medication for another 2 weeks.  Then resume on dose reduced olaparib  150 mg twice daily this past Monday 07/14/2023.   Component Ref Range & Units (hover) 2 wk ago (06/27/23) 1 mo ago (06/06/23) 2 mo ago (05/15/23) 2 mo ago (04/24/23) 3 mo ago (04/03/23) 4 mo ago (03/13/23) 5 mo ago (01/24/23)  Cancer Antigen (CA) 125 6.8 8.1 CM 8.7 CM 7.7 CM 7.3 CM 7.0 CM 14.3 C     Gynecologic Oncology History:  Presented from Dr. 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 Nancy [name] is a 58 y.o. female who presented with abdominal pain with CT showing large volume ascites and omental caking concerning for peritoneal carcinomatosis.  Patient has past medical history significant for type 2 diabetes, CAD, asthma, chronic pain, diabetic foot infection, GERD, hyperlipidemia, Takotsubo cardiomyopathy hospitalized from 9/11 to 09/30/2022 with sepsis secondary to diabetic foot infection/osteomyelitis s/p left BKA 9/17, also found to have Staph aureus bacteremia with TEE negative for vegetations, discharged home with 2 weeks of IV cefazolin , completed a few days ago, who presents to the ED with a 6-day history of nonbloody nonbilious vomiting Associated with generalized abdominal pain and distention.  States during her recent past hospitalization she initially presented with bleeding from her rectum but it spontaneously resolved.  She had abdominal pain and nausea but she attributed it to her foot  infection.  She denies cough, chest pain, shortness of breath, leg pain, black or bloody stool except for one-time episode prior to her last hospitalization and denies, dysuria.  Hospital Course: Patient presents with abdominal pain/distention, found to have malignant ascites. Received a second therapeutic paracentesis on 10/21. Oncology following, suspects gyn primary. Final path report adenocarcinoma, but insufficient material for IHC studies. Mild aki resolved and patient tolerating a diet. PT evaluated and deemed safe for discharge.  10/26/22 CA125 =762, CEA and CA19-9 normal  Maternal aunt with ovarian cancer.  No breast cancer in the family.   Patient completed 4 cycles of neoadjuvant carboplatin  and taxol  (175 mg/m2) for presumed mullerian primary cancer.   CA 125 10/26/22 762  11/21/22- 944 12/13/22-  332 01/03/23-  53.2 01/24/23- 14.3  01/13/2023- CT C/A/P w/ contrast - decreasing ascites, trace. Omental caking and nodularity along perineum decreasing as well with significant residual particularly in the upper anterior abdomen.  - stable small mesenteric and retroperitoneal nodes - resolved pleural effusion - small left renal angiomyolipoma and upper pole nonobstructing renal stone.   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 The report date is 11/25/2022.   The CancerNext-Expanded + RNAinsight gene panel offered by [employer] and includes sequencing and rearrangement analysis for the following 71 genes: AIP, ALK, APC*, ATM*, AXIN2, BAP1, BARD1, BMPR1A, BRCA1*, BRCA2*, BRIP1*, CDC73, CDH1*,CDK4, CDKN1B, CDKN2A, CHEK2*, CTNNA1, DICER1, FH, FLCN, KIF1B, LZTR1, MAX, MEN1, MET, MLH1*, MSH2*, MSH3, MSH6*, MUTYH*, NF1*, NF2, NTHL1, PALB2*, PHOX2B, PMS2*, POT1, PRKAR1A, PTCH1, PTEN*, RAD51C*, RAD51D*,RB1, RET, SDHA, SDHAF2, SDHB,  SDHC, SDHD, SMAD4, SMARCA4, SMARCB1, SMARCE1, STK11, SUFU, TMEM127,  TP53*,TSC1, TSC2, VHL; EGFR, EGLN1, HOXB13, KIT, MITF, PDGFRA, POLD1 and POLE (sequencing only); EPCAM and GREM1 (deletion/duplication only).   Myriad HRD positive. BRCA1/BRCA2 negative.   02/20/23 she underwent R1 robotic interval debulking at [facility] with residual miliary disease 3mm. A. Uterus, bilateral fallopian tubes, and bilateral ovaries, total hysterectomy and bilateral salpingo-oophorectomy: Residual high grade serous carcinoma (1.6 cm) involving bilateral ovaries, left fallopian tube, and uterine serosa.   Additional findings:  Uterus with leiomyomata (up to 1.1 cm) Right fallopian tube with no pathologic diagnosis   B. Peritoneal nodules, excision: High grade serous carcinoma (up to 0.1 cm).   C. Omentum, omentectomy:  High grade serous carcinoma (0.7 cm). Moderate treatment response (CRS2).   04/20/2023 CT C/A/P IMPRESSION: 1. Diminished peritoneal stranding and nodularity throughout the abdomen, with minimal residual thickening throughout, consistent with treatment response of peritoneal carcinomatosis. 2. Status post hysterectomy, oophorectomy, and omentectomy. 3. No evidence of lymphadenopathy or metastatic disease in the chest, abdomen, or pelvis. 4. Smoking-related respiratory bronchiolitis. 5. Hepatic steatosis. 6. Nonobstructive bilateral nephrolithiasis. 7. Coronary artery disease .   05/15/2023 She received additional chemotherapy for a total of eight cycles. Final cycle 05/15/2023.   Olaparib  initiated.     Mammogram normal 2021, no prior colonoscopy. Had an episode of rectal bleeding, but none now.  Problem List: Patient Active Problem List   Diagnosis Date Noted   Chemotherapy induced diarrhea 06/27/2023   Hypomagnesemia 06/27/2023   Neck pain 06/27/2023   Port-A-Cath in place 06/27/2023   Atherosclerosis of native arteries of extremity with intermittent claudication (HCC) 06/04/2023   Chemotherapy-induced neuropathy (HCC) 03/13/2023   Neuropathy  03/13/2023   Generalized anxiety disorder 02/28/2023   Blurring of vision 12/13/2022   Chemotherapy-induced nausea 12/13/2022   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   Encounter for antineoplastic chemotherapy 11/21/2022   Neoplasm related pain 11/21/2022   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   Protein calorie malnutrition (HCC) 11/06/2022   IDA (iron  deficiency anemia) 11/06/2022   Normocytic anemia 10/28/2022   Malignant ascites 10/23/2022   Hypotension 10/22/2022   Intractable vomiting 10/22/2022   S/P left  BKA 09/24/22 secondary to osteomyelitis (below knee amputation) (HCC) 10/22/2022   Diarrhea 10/22/2022   Adjustment disorder with mixed anxiety and depressed mood 09/26/2022   B12 deficiency due to diet 05/16/2022   Vitamin D  deficiency, unspecified 05/16/2022   Atherosclerosis of native arteries of the extremities with ulceration (HCC) 01/30/2021   Diabetes mellitus, type 2 (HCC) 03/13/2015   Allergic rhinitis 05/10/2014   Asthma without status asthmaticus 05/10/2014   Atherosclerosis of native coronary artery of native heart with angina pectoris (HCC) 05/10/2014   Clinical depression 05/10/2014   Neuropathy, diabetic (HCC) 05/10/2014   Acid reflux 05/10/2014   Mixed hyperlipidemia 05/10/2014   Migraine without aura and without status migrainosus, not intractable 05/10/2014   Takotsubo cardiomyopathy 05/10/2014   Obesity, Class III, BMI 40-49.9 (morbid obesity) 05/10/2014   Essential hypertension 08/09/2008   Heart failure (HCC) 06/06/2008    Past Medical History: Past Medical History:  Diagnosis Date   Abnormal cardiovascular stress test 09/21/2018   Formatting of this note might be different from the original. Lexiscan Myoview 09/16/2018 revealed mild anterior ischemia   Adverse effect of motion 05/10/2014   AKI (acute kidney injury) (HCC) 10/22/2022   Anxiety    Arthritis    r knee   Asthma    Breast cyst  05/10/2014   CAD (coronary artery disease)    a.) LHC 06/04/2009 at [facility]; non-obstructive CAD. b.) CTA with FFR 10/08/2018: extensive mixed plaque proximal to mid LAD (51-69%); Coronary Ca score 217; FFR 0.71 dPDA, 0.86 mLAD, 0.87 dLCx.   CCF (congestive cardiac failure) (HCC) 06/06/2008   a.) 30% EF. b.) TTE 06/03/2011: EF >55%; triv MR. c.) TTE 04/27/2018: EF 55%; mild LVH; triv PR, mild MR/TR; G1DD.   Cellulitis of left lower extremity 09/18/2022   Chest pain with high risk for cardiac etiology 07/02/2016   Chronic use of opiate drug for therapeutic purpose 02/13/2022   Formatting of this note might be different from the original. [facility] Pain Contract signed on 04/18/16 & updated 03/04/17; UDS done on 04/18/16.   Complication of anesthesia    Diabetic foot infection (HCC) 03/13/2015   Diabetic foot ulcer (HCC) 02/08/2021   Diabetic ulcer of left heel associated with type 2 diabetes mellitus (HCC) 02/08/2021   Eczema    Family history of adverse reaction to anesthesia    a.) PONV in mother and grandmother   Gas gangrene (HCC) 09/18/2022   GERD (gastroesophageal reflux disease) 05/10/2014   History of kidney stones    History of MSSA bacteremia due to osteomyelitis L foot 09/18/22 10/22/2022   HLD (hyperlipidemia)    Hyponatremia 03/13/2015   Migraines    Motion sickness    MSSA bacteremia 09/20/2022   Osteomyelitis (HCC) 09/18/2022   Other acute osteomyelitis, left ankle and foot (HCC) 02/13/2022   Panic attacks    Peripheral edema 04/27/2018   Pneumonia    PONV (postoperative nausea and vomiting)    Sepsis secondary to diabetic foot infection 03/13/2015   Sepsis secondary to diabetic foot infection 03/13/2015   T2DM (type 2 diabetes mellitus) (HCC)    Takotsubo cardiomyopathy / transient apical balooning syndrome / stress-induced cardiomyopathy 05/10/2014   Unspecified essential hypertension     Past Surgical History: Past Surgical History:  Procedure Laterality Date   AMPUTATION  Left 09/19/2022   Procedure: AMPUTATION BELOW KNEE;  Surgeon: Jama Cordella MATSU, MD;  Location: [facility];  Service: Vascular;  Laterality: Left;   AMPUTATION Left 09/24/2022   Procedure: AMPUTATION BELOW KNEE WITH WOUND CLOSURE;  Surgeon: Jama Cordella MATSU, MD;  Location: [facility];  Service: Vascular;  Laterality: Left;   AMPUTATION TOE Left 03/16/2015   Procedure: left fifth toe amputation with incision and drainage;  Surgeon: Eva Gay, DPM;  Location: [facility];  Service: Podiatry;  Laterality: Left;   APPLICATION OF WOUND VAC Left 02/14/2021   Procedure: APPLICATION OF WOUND VAC;  Surgeon: Gay Eva, DPM;  Location: [facility];  Service: Podiatry;  Laterality: Left;   APPLICATION OF WOUND VAC Left 04/16/2021   Procedure: APPLICATION OF WOUND VAC;  Surgeon: Lowery Estefana RAMAN, DO;  Location: [facility];  Service: Plastics;  Laterality: Left;   CARDIAC CATHETERIZATION Left 06/04/2009   Procedure: CARDIAC CATHETERIZATION; Location: [facility]   CHOLECYSTECTOMY     DEBRIDEMENT AND CLOSURE WOUND Left 04/16/2021   Procedure: DEBRIDEMENT AND CLOSURE WOUND;  Surgeon: Lowery Estefana RAMAN, DO;  Location: [facility];  Service: Plastics;  Laterality: Left;  1 hour   IR IMAGING GUIDED PORT INSERTION  11/18/2022   IRRIGATION AND DEBRIDEMENT FOOT Left 02/08/2021   Procedure: IRRIGATION AND DEBRIDEMENT FOOT - LFT HEEL ULCER;  Surgeon: Gay Eva, DPM;  Location: [facility];  Service: Podiatry;  Laterality: Left;   IRRIGATION AND DEBRIDEMENT FOOT Left 02/14/2021  Procedure: IRRIGATION AND DEBRIDEMENT LEFT HEEL;  Surgeon: Ashley Soulier, DPM;  Location: [facility];  Service: Podiatry;  Laterality: Left;   kidney stone removal     KNEE ARTHROSCOPY W/ MENISCAL REPAIR     NOSE SURGERY  01/07/1989   due to fracture   TEE WITHOUT CARDIOVERSION N/A 09/26/2022   Procedure: TRANSESOPHAGEAL ECHOCARDIOGRAM;  Surgeon: Dewane Shiner, DO;  Location: [facility];  Service:  Cardiovascular;  Laterality: N/A;   TOE AMPUTATION     second toe   TONSILLECTOMY     TOTAL KNEE ARTHROPLASTY  01/11/2011   Procedure: TOTAL KNEE ARTHROPLASTY;  Surgeon: Norleen LITTIE Gavel;  Location: [facility];  Service: Orthopedics;  Laterality: Right;  COMPUTER ASSISTED TOTAL KNEE REPLACEMENT       OB History:  OB History  Gravida Para Term Preterm AB Living  0 0 0 0 0 0  SAB IAB Ectopic Multiple Live Births  0 0 0 0 0    Family History: Family History  Problem Relation Age of Onset   Transient ischemic attack Mother    Asthma Mother    Hypertension Mother    Lung cancer Father 51   Hypertension Father    Asthma Sister    Anxiety disorder Sister    Asthma Sister    Hypertension Brother    Ovarian cancer Maternal Aunt 45   Melanoma Maternal Uncle        dx 54s   Lung cancer Paternal Aunt 7   Stomach cancer Maternal Grandmother    Colon cancer Maternal Grandmother    Lymphoma Maternal Grandmother    Melanoma Maternal Grandfather 72       sun exposure   Lung cancer Paternal Grandmother 53   Kidney cancer Paternal Grandfather        dx early   Crohn's disease Other    GER disease Other    Breast cancer Neg Hx     Social History: Social History   Socioeconomic History   Marital status: Married    Spouse name: Not on file   Number of children: Not on file   Years of education: Not on file   Highest education level: Not on file  Occupational History   Not on file  Tobacco Use   Smoking status: Former    Current packs/day: 0.00    Types: Cigarettes    Quit date: 03/12/1985    Years since quitting: 38.3   Smokeless tobacco: Never  Vaping Use   Vaping status: Never Used  Substance and Sexual Activity   Alcohol use: No   Drug use: No   Sexual activity: Yes    Birth control/protection: Post-menopausal  Other Topics Concern   Not on file  Social History Narrative   Wife, Barnie, at bedside. One indoor pet, dog.   Social Drivers of Research scientist (physical sciences) Strain: Not on file  Food Insecurity: No Food Insecurity (10/22/2022)   Hunger Vital Sign    Worried About Running Out of Food in the Last Year: Never true    Ran Out of Food in the Last Year: Never true  Transportation Needs: No Transportation Needs (10/22/2022)   PRAPARE - Administrator, Civil Service (Medical): No    Lack of Transportation (Non-Medical): No  Physical Activity: Not on file  Stress: Not on file  Social Connections: Not on file  Intimate Partner Violence: Not At Risk (10/22/2022)   Humiliation, Afraid, Rape, and Kick questionnaire    Fear of  Current or Ex-Partner: No    Emotionally Abused: No    Physically Abused: No    Sexually Abused: No    Allergies: Allergies  Allergen Reactions   Shellfish Allergy Anaphylaxis   Codeine Other (See Comments)    Migraine  Other Reaction(s): Other (See Comments)  Reaction:  Severe migraines   Morphine  And Codeine Other (See Comments)    Reaction:  Severe migraines    Rosuvastatin     Other reaction(s): Muscle Pain   Percocet [Oxycodone -Acetaminophen ] Nausea And Vomiting   Sulfa Antibiotics Rash   Theophyllines Itching and Rash    Current Medications: Current Outpatient Medications  Medication Sig Dispense Refill   acidophilus (RISAQUAD) CAPS capsule Take 1 capsule by mouth daily.     albuterol  (PROVENTIL  HFA;VENTOLIN  HFA) 108 (90 Base) MCG/ACT inhaler Inhale 2 puffs into the lungs every 4 (four) hours as needed for wheezing or shortness of breath. Reported on 03/29/2015 1 Inhaler 3   albuterol  (PROVENTIL ) (2.5 MG/3ML) 0.083% nebulizer solution Take 2.5 mg by nebulization as needed.     ALPRAZolam  (XANAX ) 0.5 MG tablet TAKE 1/2 TO 1 TABLET BY MOUTH AS NEEDED FOR PANIC ATTACKS 30 tablet 2   ascorbic acid  (VITAMIN C ) 500 MG tablet Take 1 tablet (500 mg total) by mouth 2 (two) times daily. 60 tablet 0   aspirin  EC 81 MG tablet Take 81 mg by mouth daily.     carvedilol  (COREG ) 6.25 MG tablet TAKE 1  TABLET BY MOUTH TWICE A DAY 180 tablet 1   cholecalciferol (VITAMIN D3) 25 MCG (1000 UNIT) tablet Take 1,000 Units by mouth daily.     Continuous Glucose Sensor (FREESTYLE LIBRE 2 SENSOR) MISC 1 each by Does not apply route every 14 (fourteen) days. 2 each 3   Cyanocobalamin  (VITAMIN B 12) 500 MCG TABS Take by mouth daily at 8 pm.     desvenlafaxine (PRISTIQ) 100 MG 24 hr tablet TAKE 1 TABLET BY MOUTH EVERY DAY 90 tablet 2   dexamethasone  (DECADRON ) 4 MG tablet Take 2 tablets (8mg ) by mouth daily starting the day after carboplatin  for 2 days. Take with food 30 tablet 1   dicyclomine  (BENTYL ) 10 MG capsule Take 1 capsule (10 mg total) by mouth 4 (four) times daily -  before meals and at bedtime. 60 capsule 1   diphenoxylate -atropine  (LOMOTIL ) 2.5-0.025 MG tablet Take 1 tablet by mouth 4 (four) times daily as needed for diarrhea or loose stools. 30 tablet 0   Dulaglutide  (TRULICITY ) 1.5 MG/0.5ML SOAJ INJECT 1.5 MG SUBCUTANEOUSLY ONCE A WEEK 2 mL 2   Ensure Max Protein (ENSURE MAX PROTEIN) LIQD Take 330 mLs (11 oz total) by mouth 2 (two) times daily.     fenofibrate  54 MG tablet TAKE 1 TABLET BY MOUTH EVERY DAY 30 tablet 11   gabapentin  (NEURONTIN ) 300 MG capsule TAKE 1 CAPSULE (300MG  TOTAL) BY MOUTH AT BEDTIME AS NEEDED 30 capsule 3   HYDROmorphone  (DILAUDID ) 2 MG tablet Take 1 tablet (2 mg total) by mouth every 4 (four) hours as needed for severe pain (pain score 7-10). 60 tablet 0   ibuprofen (ADVIL) 800 MG tablet TAKE 1 TABLET BY MOUTH EVERY 8 HOURS AS NEEDED 90 tablet 3   insulin  NPH-regular Human (70-30) 100 UNIT/ML injection Inject 15 Units into the skin in the morning and at bedtime.     iron  polysaccharides (NIFEREX) 150 MG capsule Take 1 capsule (150 mg total) by mouth daily. 30 capsule 2   lidocaine -prilocaine  (EMLA ) cream Apply to  affected area once 30 g 3   lisinopril  (ZESTRIL ) 10 MG tablet Take 1 tablet (10 mg total) by mouth every morning AND 0.5 tablets (5 mg total) at bedtime. 45  tablet 11   lisinopril  (ZESTRIL ) 5 MG tablet Take 1 tablet (5 mg total) by mouth 2 (two) times daily. 180 tablet 1   metFORMIN  (GLUCOPHAGE ) 500 MG tablet TAKE TWO TABLETS EACH MORNING. AND 3 TABLETS IN THE EVENING AS DIRECTED 150 tablet 5   methocarbamol  (ROBAXIN ) 500 MG tablet Take 1 tablet (500 mg total) by mouth every 8 (eight) hours as needed for muscle spasms. 30 tablet 0   mirtazapine  (REMERON ) 7.5 MG tablet TAKE 1 TABLET BY MOUTH AT BEDTIME. 90 tablet 2   montelukast  (SINGULAIR ) 10 MG tablet TAKE 1 TABLET BY MOUTH EVERYDAY AT BEDTIME 30 tablet 5   MOUNJARO  5 MG/0.5ML Pen SMARTSIG:5 Milligram(s) SUB-Q Once a Week     Multiple Vitamin (MULTIVITAMIN WITH MINERALS) TABS tablet Take 1 tablet by mouth daily. 30 tablet 2   naloxone  (NARCAN ) nasal spray 4 mg/0.1 mL SPRAY 1 SPRAY INTO ONE NOSTRIL AS DIRECTED FOR OPIOID OVERDOSE (TURN PERSON ON SIDE AFTER DOSE. IF NO RESPONSE IN 2-3 MINUTES OR PERSON RESPONDS BUT RELAPSES, REPEAT USING A NEW SPRAY DEVICE AND SPRAY INTO THE OTHER NOSTRIL. CALL 911 AFTER USE.) * EMERGENCY USE ONLY * 1 each 0   nystatin  (MYCOSTATIN /NYSTOP ) powder APPLY TOPICALLY TWICE DAILY AS NEEDED 30 g 3   nystatin  ointment (MYCOSTATIN ) Apply 1 Application topically 2 (two) times daily. 30 g 0   olaparib  (LYNPARZA ) 150 MG tablet Take 150 mg by mouth 2 (two) times daily. Swallow whole. May take with food to decrease nausea and vomiting.     ondansetron  (ZOFRAN ) 8 MG tablet Take 1 tablet (8 mg total) by mouth every 8 (eight) hours as needed for nausea or vomiting. Start on the third day after carboplatin . 30 tablet 1   ondansetron  (ZOFRAN -ODT) 8 MG disintegrating tablet Take 1 tablet (8 mg total) by mouth every 8 (eight) hours as needed for nausea or vomiting. 90 tablet 3   pantoprazole  (PROTONIX ) 40 MG tablet TAKE 1 TABLET BY MOUTH EVERY DAY 90 tablet 3   pravastatin  (PRAVACHOL ) 40 MG tablet TAKE 1 TABLET BY MOUTH EVERY DAY 30 tablet 5   prochlorperazine  (COMPAZINE ) 10 MG tablet Take 1  tablet (10 mg total) by mouth every 6 (six) hours as needed for nausea or vomiting. 90 tablet 1   UBRELVY  100 MG TABS TAKE 1 TABLET (100 MG TOTAL) BY MOUTH AS NEEDED (MIGRAINE). 12 tablet 1   zinc  sulfate 220 (50 Zn) MG capsule Take 1 capsule (220 mg total) by mouth daily. 30 capsule 0   No current facility-administered medications for this visit.    Review of Systems General: weakness o/w no complaints  HEENT: no complaints  Lungs: no complaints  Cardiac: no complaints  GI: nausea/vomiting/diarrhea which is mild and controlled - due to olaparib  o/w no complaints  GU: no complaints  Musculoskeletal: no complaints  Extremities: no complaints  Skin: no complaints  Neuro: neuropathy bilateral hand/fingers o/w no complaints  Endocrine: no complaints  Psych: no complaints       Objective:  Physical Examination:  Vitals:   07/16/23 1137  BP: (!) 150/56  Pulse: 75  Resp: 19  Temp: (!) 96.8 F (36 C)  SpO2: 99%       ECOG Performance Status: 1 - Symptomatic but completely ambulatory  GENERAL: Patient is a well appearing female  in no acute distress HEENT: Atraumatic normocephalic NODES:  No cervical, supraclavicular, axillary, or inguinal lymphadenopathy palpated.  LUNGS: Normal respiratory effort  ABDOMEN:  Soft, nontender.  Nondistended.  No masses hernias or ascites EXTREMITIES:  No peripheral edema.   SKIN: Moist erythematous skin underlying the pannus NEURO:  Nonfocal. Well oriented.  Appropriate affect.  Pelvic: Chaperone by CMA EGBUS: no lesions Cervix: Surgically absent Vagina: no lesions, no discharge or bleeding on speculum exam.  Palpation there is a band on the left side of the vagina and you can palpate deeper on the right.  The vaginal cuff is intact and there is no masses or nodularity Uterus: Surgically absent BME: no palpable masses    Lab Review   Chemistry      Component Value Date/Time   NA 134 (L) 06/27/2023 0931   NA 132 (L) 08/07/2022 1210    NA 133 (L) 08/21/2012 1523   K 4.6 06/27/2023 0931   K 4.1 08/21/2012 1523   CL 102 06/27/2023 0931   CL 102 08/21/2012 1523   CO2 23 06/27/2023 0931   CO2 26 08/21/2012 1523   BUN 29 (H) 06/27/2023 0931   BUN 23 08/07/2022 1210   BUN 17 08/21/2012 1523   CREATININE 0.97 06/27/2023 0931   CREATININE 0.69 08/21/2012 1523   GLU 231 02/07/2012 0000      Component Value Date/Time   CALCIUM 8.9 06/27/2023 0931   CALCIUM 8.8 08/21/2012 1523   ALKPHOS 53 06/27/2023 0931   ALKPHOS 73 08/21/2012 1523   AST 26 06/27/2023 0931   ALT 28 06/27/2023 0931   ALT 25 08/21/2012 1523   BILITOT 0.6 06/27/2023 0931      Lab Results  Component Value Date   WBC 8.4 06/27/2023   HGB 10.3 (L) 06/27/2023   HCT 31.7 (L) 06/27/2023   MCV 105.0 (H) 06/27/2023   PLT 235 06/27/2023    Radiologic Imaging: CT scan 10/22/22  FINDINGS: Lower chest: 6 no acute abnormality   Hepatobiliary: No focal liver abnormality is seen. Status post cholecystectomy. No biliary dilatation.   Pancreas: No focal abnormality or ductal dilatation.   Spleen: No focal abnormality.  Normal size.   Adrenals/Urinary Tract: No adrenal abnormality. No focal renal abnormality. No stones or hydronephrosis. Urinary bladder is unremarkable.   Stomach/Bowel: Stomach, large and small bowel grossly unremarkable.   Vascular/Lymphatic: Aortic atherosclerosis. No evidence of aneurysm or adenopathy.   Reproductive: Uterus and adnexa unremarkable.  No mass.   Other: Large volume ascites in the abdomen and pelvis. Omental thickening/caking noted anteriorly. Appearance is concerning for peritoneal carcinomatosis, often seen with ovarian cancer. However, no ovarian mass visualized.   Musculoskeletal: No acute bony abnormality.   IMPRESSION: Large volume ascites in the abdomen or pelvis with omental caking. Appearance is concerning for peritoneal malignancy/carcinomatosis, often seen with ovarian cancer, but no ovarian  mass visualized.   Aortic atherosclerosis.   01/13/2023 Narrative & Impression  CLINICAL DATA:  Peritoneal carcinomatosis.  * Tracking Code: BO *   EXAM: CT CHEST, ABDOMEN, AND PELVIS WITH CONTRAST   TECHNIQUE: Multidetector CT imaging of the chest, abdomen and pelvis was performed following the standard protocol during bolus administration of intravenous contrast.   RADIATION DOSE REDUCTION: This exam was performed according to the departmental dose-optimization program which includes automated exposure control, adjustment of the mA and/or kV according to patient size and/or use of iterative reconstruction technique.   CONTRAST:  OMNIPAQUE  IOHEXOL  300 MG/ML  SOLN   COMPARISON:  Chest CT 11/14/2022.  Abdomen pelvis CT 10/22/2022.   FINDINGS: CT CHEST FINDINGS   Cardiovascular: Right upper chest port is accessed. Tip of the catheter extends into the right [facility]. Coronary artery calcifications are seen. The heart is nonenlarged. Calcifications along the mitral valve annulus. Thoracic aorta has a normal course and caliber with some mild calcified plaque scattered.   Mediastinum/Nodes: Preserved thyroid  gland. Slightly patulous thoracic esophagus. No specific abnormal lymph node enlargement identified in the axillary regions, hilum or mediastinum.   Lungs/Pleura: Calcified right apical lung nodule identified on series 4, image 27 consistent with old granulomatous disease and is unchanged. No new dominant lung nodule identified. No consolidation, pneumothorax or effusion. Additional punctate calcification in the left lung series 4, image 98. Previous pleural effusion on the left is no longer identified. The small ground-glass nodule in the posterior right lower lobe is stable in size on image 77 but much more faint.   Musculoskeletal: There is some curvature of the spine with some degenerative changes.   CT ABDOMEN PELVIS FINDINGS   Hepatobiliary: No focal liver  abnormality is seen. Status post cholecystectomy. No biliary dilatation. Patent portal vein.   Pancreas: Unremarkable. No pancreatic ductal dilatation or surrounding inflammatory changes.   Spleen: Normal in size without focal abnormality.   Adrenals/Urinary Tract: The adrenal glands are preserved. Punctate upper pole nonobstructing left-sided renal stone. There is small area of macroscopic fat as well in the posterior left mid kidney consistent with a angiomyolipoma measuring 16 mm on series 2, image 68. No separate enhancing mass. There are some tiny low-attenuation lesions elsewhere as well which are too small to completely characterize. Bosniak 2 lesions. Ureters have normal course and caliber down to the bladder. Preserved contours of the urinary bladder.   Stomach/Bowel: Stomach and small bowel are nondilated on this non oral contrast examination large bowel as well has a normal course and caliber with diffuse colonic stool.   Vascular/Lymphatic: Normal caliber aorta and IVC. Scattered vascular calcifications. There are several small less than 1 cm size nodes identified along the retroperitoneum, nonpathologic by size criteria. More numerous than usually seen but similar in appearance of the previous examination.   Reproductive: Uterus and bilateral adnexa are unremarkable.   Other: Decreasing ascites and peritoneal nodularity with significant areas of residual nodularity. Greatest along the extreme anterior abdomen such as series 2 image 79. The level of the central mesenteric stranding is similar but the ascites is less.   Musculoskeletal: Curvature of the spine with moderate degenerative changes.   IMPRESSION: Decreasing ascites, trace residual. The areas of omental caking and nodularity along the perineum is decreasing as well with significant residual particularly in the upper anterior abdomen.   Stable small mesenteric and retroperitoneal nodes.   Resolved  left pleural effusion.   No developing new mass lesion.   Small left renal angiomyolipoma and upper pole nonobstructing renal stone.       CT CHEST ABDOMEN PELVIS W CONTRAST CLINICAL DATA:  Ovarian cancer restaging, peritoneal carcinomatosis * Tracking Code: BO *  EXAM: CT CHEST, ABDOMEN, AND PELVIS WITH CONTRAST  TECHNIQUE: Multidetector CT imaging of the chest, abdomen and pelvis was performed following the standard protocol during bolus administration of intravenous contrast.  RADIATION DOSE REDUCTION: This exam was performed according to the departmental dose-optimization program which includes automated exposure control, adjustment of the mA and/or kV according to patient size and/or use of iterative reconstruction technique.  CONTRAST:  OMNIPAQUE  IOHEXOL  300 MG/ML SOLN additional oral  enteric contrast  COMPARISON:  01/13/2023  FINDINGS: CT CHEST FINDINGS  Cardiovascular: Right chest port catheter. Aortic atherosclerosis. Normal heart size. Left coronary artery calcifications. No pericardial effusion.  Mediastinum/Nodes: No enlarged mediastinal, hilar, or axillary lymph nodes. Thyroid  gland, trachea, and esophagus demonstrate no significant findings.  Lungs/Pleura: Diffuse bilateral bronchial wall thickening. Background of fine centrilobular nodularity throughout the lungs, most concentrated in the lung apices. No pleural effusion or pneumothorax.  Musculoskeletal: No chest wall abnormality. No acute osseous findings.  CT ABDOMEN PELVIS FINDINGS  Hepatobiliary: No focal liver abnormality is seen. Hepatic steatosis. Status post cholecystectomy. No biliary dilatation.  Pancreas: Unremarkable. No pancreatic ductal dilatation or surrounding inflammatory changes.  Spleen: Normal in size without significant abnormality.  Adrenals/Urinary Tract: Adrenal glands are unremarkable. Definitively benign small bilateral renal angiomyolipomas, requiring no  further follow-up or characterization. Small bilateral nonobstructive renal calculi. No ureteral calculi or hydronephrosis. Bladder is unremarkable.  Stomach/Bowel: Stomach is within normal limits. Appendix appears normal. No evidence of bowel wall thickening, distention, or inflammatory changes. Status post omentectomy  Vascular/Lymphatic: Aortic atherosclerosis. No enlarged abdominal or pelvic lymph nodes.  Reproductive: Status post hysterectomy.  Other: No abdominal wall hernia or abnormality. No ascites. Diminished peritoneal stranding and nodularity throughout the abdomen, with minimal residual thickening throughout, for example in the left paracolic gutter and midline ventral abdomen (series 2, image 70)  Musculoskeletal: No acute osseous findings.  IMPRESSION: 1. Diminished peritoneal stranding and nodularity throughout the abdomen, with minimal residual thickening throughout, consistent with treatment response of peritoneal carcinomatosis. 2. Status post hysterectomy, oophorectomy, and omentectomy. 3. No evidence of lymphadenopathy or metastatic disease in the chest, abdomen, or pelvis. 4. Smoking-related respiratory bronchiolitis. 5. Hepatic steatosis. 6. Nonobstructive bilateral nephrolithiasis. 7. Coronary artery disease .  Aortic Atherosclerosis (ICD10-I70.0).  Electronically Signed   By: Marolyn JONETTA Jaksch M.D.   On: 04/20/2023 16:07      Assessment:  Nancy [name] is a 58 y.o. 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 CA125 elevated at 762, and CEA and CA19-9 normal. 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 Excellent response to carboplatin /taxol  x 4 cycles with CA125 17 1/25. On 02/20/23 she underwent R1 robotic interval debulking with hysterectomy, BSO and debulking at [facility] with residual miliary disease 3mm.  She then received 8 cycles of chemotherapy and  CA125 normalized.  Initiated on olaparib .  Olaparib  hold due to side effects.  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  Strongly recommend that she continues therapy and follow-up with Dr. Babara and her team regarding mitigation of symptoms.  If she is unable to tolerate elaborative she could also try niraparib.  Follow-up with Dr. Babara with regard to her continued treatment and management of other medical related issues.  For the skin candidiasis we recommended using the nystatin  powder more frequently.   Discussed that hereditary genetic panel testing results and her family is getting cascade testing. Her mother had the BRIP gene mutation.   We can see her back in 3 months.   The patient's diagnosis, an outline of the further  diagnostic and laboratory studies which will be required, the recommendation, and alternatives were discussed.  All questions were answered to the patient's satisfaction.   Vayda Dungee Isidor Constable, MD

## 2023-07-17 ENCOUNTER — Encounter: Payer: Self-pay | Admitting: Family

## 2023-07-17 ENCOUNTER — Ambulatory Visit: Admitting: Family

## 2023-07-17 VITALS — BP 130/84 | HR 68 | Ht 69.0 in | Wt 274.0 lb

## 2023-07-17 DIAGNOSIS — J449 Chronic obstructive pulmonary disease, unspecified: Secondary | ICD-10-CM

## 2023-07-17 DIAGNOSIS — E559 Vitamin D deficiency, unspecified: Secondary | ICD-10-CM | POA: Diagnosis not present

## 2023-07-17 DIAGNOSIS — E66813 Obesity, class 3: Secondary | ICD-10-CM

## 2023-07-17 DIAGNOSIS — E538 Deficiency of other specified B group vitamins: Secondary | ICD-10-CM | POA: Diagnosis not present

## 2023-07-17 DIAGNOSIS — E1165 Type 2 diabetes mellitus with hyperglycemia: Secondary | ICD-10-CM

## 2023-07-17 DIAGNOSIS — I509 Heart failure, unspecified: Secondary | ICD-10-CM

## 2023-07-17 DIAGNOSIS — E782 Mixed hyperlipidemia: Secondary | ICD-10-CM

## 2023-07-17 DIAGNOSIS — I10 Essential (primary) hypertension: Secondary | ICD-10-CM | POA: Diagnosis not present

## 2023-07-17 DIAGNOSIS — Z794 Long term (current) use of insulin: Secondary | ICD-10-CM

## 2023-07-17 DIAGNOSIS — C786 Secondary malignant neoplasm of retroperitoneum and peritoneum: Secondary | ICD-10-CM

## 2023-07-17 MED ORDER — MOUNJARO 7.5 MG/0.5ML ~~LOC~~ SOAJ: 7.5000 mg | SUBCUTANEOUS | 0 refills | Status: DC

## 2023-07-17 MED ORDER — INSULIN NPH ISOPHANE & REGULAR (70-30) 100 UNIT/ML ~~LOC~~ SUSP: 15.0000 [IU] | Freq: Two times a day (BID) | SUBCUTANEOUS | 11 refills | Status: DC

## 2023-07-17 NOTE — Assessment & Plan Note (Signed)
 Continue current therapy for lipid control. Will modify as needed based on labwork results.

## 2023-07-17 NOTE — Assessment & Plan Note (Signed)
 Will continue supplements as needed.

## 2023-07-17 NOTE — Assessment & Plan Note (Signed)
 Continue current diabetes POC, as patient has been improving on current regimen.  Will adjust meds if needed based on labs.

## 2023-07-17 NOTE — Assessment & Plan Note (Signed)
 Blood pressure well controlled with current medications.  Continue current therapy.  Will reassess at follow up.

## 2023-07-17 NOTE — Assessment & Plan Note (Signed)
Patient is seen by cardiology, who manage this condition.  She is well controlled with current therapy.   Will defer to them for further changes to plan of care.

## 2023-07-17 NOTE — Assessment & Plan Note (Signed)
 Continue current meds.  Will adjust as needed based on results.  The patient is asked to make an attempt to improve diet and exercise patterns to aid in medical management of this problem. Addressed importance of increasing and maintaining water intake.

## 2023-07-17 NOTE — Assessment & Plan Note (Signed)
 Patient is seen by pulmonary, who manage this condition.  She is well controlled with current therapy.   Will defer to them for further changes to plan of care.

## 2023-07-17 NOTE — Progress Notes (Signed)
 Established Patient Office Visit  Subjective:  Patient ID: Nancy [name], female    DOB: 1965-09-16  Age: 58 y.o. MRN: [id number]  Chief Complaint  Patient presents with   Follow-up    3 month follow up     Patient is here today for her 3 months follow up.  She has been feeling fairly well given her treatments since last appointment.   She does not have additional concerns to discuss today.  Labs were done prior to appointment, will discuss in detail today.  She needs refills.   I have reviewed her active problem list, medication list, allergies, notes from last encounter, lab results for her appointment today.      No other concerns at this time.   Past Medical History:  Diagnosis Date   Abnormal cardiovascular stress test 09/21/2018   Formatting of this note might be different from the original. Lexiscan Myoview 09/16/2018 revealed mild anterior ischemia   Adverse effect of motion 05/10/2014   AKI (acute kidney injury) (HCC) 10/22/2022   Anxiety    Arthritis    r knee   Asthma    Breast cyst 05/10/2014   CAD (coronary artery disease)    a.) LHC 06/04/2009 at [facility]; non-obstructive CAD. b.) CTA with FFR 10/08/2018: extensive mixed plaque proximal to mid LAD (51-69%); Coronary Ca score 217; FFR 0.71 dPDA, 0.86 mLAD, 0.87 dLCx.   CCF (congestive cardiac failure) (HCC) 06/06/2008   a.) 30% EF. b.) TTE 06/03/2011: EF >55%; triv MR. c.) TTE 04/27/2018: EF 55%; mild LVH; triv PR, mild MR/TR; G1DD.   Cellulitis of left lower extremity 09/18/2022   Chest pain with high risk for cardiac etiology 07/02/2016   Chronic use of opiate drug for therapeutic purpose 02/13/2022   Formatting of this note might be different from the original. [facility] Pain Contract signed on 04/18/16 & updated 03/04/17; UDS done on 04/18/16.   Complication of anesthesia    Diabetic foot infection (HCC) 03/13/2015   Diabetic foot ulcer (HCC) 02/08/2021   Diabetic ulcer of left heel associated with type 2  diabetes mellitus (HCC) 02/08/2021   Eczema    Family history of adverse reaction to anesthesia    a.) PONV in mother and grandmother   Gas gangrene (HCC) 09/18/2022   GERD (gastroesophageal reflux disease) 05/10/2014   History of kidney stones    History of MSSA bacteremia due to osteomyelitis L foot 09/18/22 10/22/2022   HLD (hyperlipidemia)    Hyponatremia 03/13/2015   Migraines    Motion sickness    MSSA bacteremia 09/20/2022   Osteomyelitis (HCC) 09/18/2022   Other acute osteomyelitis, left ankle and foot (HCC) 02/13/2022   Panic attacks    Peripheral edema 04/27/2018   Pneumonia    PONV (postoperative nausea and vomiting)    Sepsis secondary to diabetic foot infection 03/13/2015   Sepsis secondary to diabetic foot infection 03/13/2015   T2DM (type 2 diabetes mellitus) (HCC)    Takotsubo cardiomyopathy / transient apical balooning syndrome / stress-induced cardiomyopathy 05/10/2014   Unspecified essential hypertension     Past Surgical History:  Procedure Laterality Date   AMPUTATION Left 09/19/2022   Procedure: AMPUTATION BELOW KNEE;  Surgeon: Jama Cordella MATSU, MD;  Location: [facility];  Service: Vascular;  Laterality: Left;   AMPUTATION Left 09/24/2022   Procedure: AMPUTATION BELOW KNEE WITH WOUND CLOSURE;  Surgeon: Jama Cordella MATSU, MD;  Location: [facility];  Service: Vascular;  Laterality: Left;   AMPUTATION TOE Left 03/16/2015   Procedure:  left fifth toe amputation with incision and drainage;  Surgeon: Eva Gay, DPM;  Location: [facility];  Service: Podiatry;  Laterality: Left;   APPLICATION OF WOUND VAC Left 02/14/2021   Procedure: APPLICATION OF WOUND VAC;  Surgeon: Gay Eva, DPM;  Location: [facility];  Service: Podiatry;  Laterality: Left;   APPLICATION OF WOUND VAC Left 04/16/2021   Procedure: APPLICATION OF WOUND VAC;  Surgeon: Lowery Estefana RAMAN, DO;  Location: [facility];  Service: Plastics;  Laterality: Left;   CARDIAC CATHETERIZATION Left  06/04/2009   Procedure: CARDIAC CATHETERIZATION; Location: [facility]   CHOLECYSTECTOMY     DEBRIDEMENT AND CLOSURE WOUND Left 04/16/2021   Procedure: DEBRIDEMENT AND CLOSURE WOUND;  Surgeon: Lowery Estefana RAMAN, DO;  Location: [facility];  Service: Plastics;  Laterality: Left;  1 hour   IR IMAGING GUIDED PORT INSERTION  11/18/2022   IRRIGATION AND DEBRIDEMENT FOOT Left 02/08/2021   Procedure: IRRIGATION AND DEBRIDEMENT FOOT - LFT HEEL ULCER;  Surgeon: Gay Eva, DPM;  Location: [facility];  Service: Podiatry;  Laterality: Left;   IRRIGATION AND DEBRIDEMENT FOOT Left 02/14/2021   Procedure: IRRIGATION AND DEBRIDEMENT LEFT HEEL;  Surgeon: Gay Eva, DPM;  Location: [facility];  Service: Podiatry;  Laterality: Left;   kidney stone removal     KNEE ARTHROSCOPY W/ MENISCAL REPAIR     NOSE SURGERY  01/07/1989   due to fracture   TEE WITHOUT CARDIOVERSION N/A 09/26/2022   Procedure: TRANSESOPHAGEAL ECHOCARDIOGRAM;  Surgeon: Dewane Shiner, DO;  Location: [facility];  Service: Cardiovascular;  Laterality: N/A;   TOE AMPUTATION     second toe   TONSILLECTOMY     TOTAL KNEE ARTHROPLASTY  01/11/2011   Procedure: TOTAL KNEE ARTHROPLASTY;  Surgeon: Norleen LITTIE Gavel;  Location: [facility];  Service: Orthopedics;  Laterality: Right;  COMPUTER ASSISTED TOTAL KNEE REPLACEMENT    Social History   Socioeconomic History   Marital status: Married    Spouse name: Not on file   Number of children: Not on file   Years of education: Not on file   Highest education level: Not on file  Occupational History   Not on file  Tobacco Use   Smoking status: Former    Current packs/day: 0.00    Types: Cigarettes    Quit date: 03/12/1985    Years since quitting: 38.3   Smokeless tobacco: Never  Vaping Use   Vaping status: Never Used  Substance and Sexual Activity   Alcohol use: No   Drug use: No   Sexual activity: Yes    Birth control/protection: Post-menopausal  Other Topics Concern    Not on file  Social History Narrative   Wife, Barnie, at bedside. One indoor pet, dog.   Social Drivers of Corporate investment banker Strain: Not on file  Food Insecurity: No Food Insecurity (10/22/2022)   Hunger Vital Sign    Worried About Running Out of Food in the Last Year: Never true    Ran Out of Food in the Last Year: Never true  Transportation Needs: No Transportation Needs (10/22/2022)   PRAPARE - Administrator, Civil Service (Medical): No    Lack of Transportation (Non-Medical): No  Physical Activity: Not on file  Stress: Not on file  Social Connections: Not on file  Intimate Partner Violence: Not At Risk (10/22/2022)   Humiliation, Afraid, Rape, and Kick questionnaire    Fear of Current or Ex-Partner: No    Emotionally Abused: No  Physically Abused: No    Sexually Abused: No    Family History  Problem Relation Age of Onset   Transient ischemic attack Mother    Asthma Mother    Hypertension Mother    Lung cancer Father 77   Hypertension Father    Asthma Sister    Anxiety disorder Sister    Asthma Sister    Hypertension Brother    Ovarian cancer Maternal Aunt 45   Melanoma Maternal Uncle        dx 65s   Lung cancer Paternal Aunt 68   Stomach cancer Maternal Grandmother    Colon cancer Maternal Grandmother    Lymphoma Maternal Grandmother    Melanoma Maternal Grandfather 72       sun exposure   Lung cancer Paternal Grandmother 81   Kidney cancer Paternal Grandfather        dx early   Crohn's disease Other    GER disease Other    Breast cancer Neg Hx     Allergies  Allergen Reactions   Shellfish Allergy Anaphylaxis   Codeine Other (See Comments)    Migraine  Other Reaction(s): Other (See Comments)  Reaction:  Severe migraines   Morphine  And Codeine Other (See Comments)    Reaction:  Severe migraines    Rosuvastatin     Other reaction(s): Muscle Pain   Percocet [Oxycodone -Acetaminophen ] Nausea And Vomiting   Sulfa  Antibiotics Rash   Theophyllines Itching and Rash    Review of Systems  All other systems reviewed and are negative.      Objective:   BP 130/84   Pulse 68   Ht 5' 9 (1.753 m)   Wt 274 lb (124.3 kg)   LMP 02/08/2013 (Within Months)   SpO2 99%   BMI 40.46 kg/m   Vitals:   07/17/23 1105  BP: 130/84  Pulse: 68  Height: 5' 9 (1.753 m)  Weight: 274 lb (124.3 kg)  SpO2: 99%  BMI (Calculated): 40.44    Physical Exam Vitals and nursing note reviewed.  Constitutional:      Appearance: Normal appearance. She is obese.  HENT:     Head: Normocephalic.  Eyes:     Extraocular Movements: Extraocular movements intact.     Conjunctiva/sclera: Conjunctivae normal.     Pupils: Pupils are equal, round, and reactive to light.  Cardiovascular:     Rate and Rhythm: Normal rate.  Pulmonary:     Effort: Pulmonary effort is normal.  Musculoskeletal:     Left Lower Extremity: Left leg is amputated below knee.  Neurological:     General: No focal deficit present.     Mental Status: She is alert and oriented to person, place, and time. Mental status is at baseline.  Psychiatric:        Mood and Affect: Mood normal.        Behavior: Behavior normal.        Thought Content: Thought content normal.      No results found for any visits on 07/17/23.  Recent Results (from the past 2160 hours)  CBC with Differential (Cancer Center Only)     Status: Abnormal   Collection Time: 04/24/23  7:58 AM  Result Value Ref Range   WBC Count 5.5 4.0 - 10.5 K/uL   RBC 2.94 (L) 3.87 - 5.11 MIL/uL   Hemoglobin 10.2 (L) 12.0 - 15.0 g/dL   HCT 68.9 (L) 63.9 - 53.9 %   MCV 105.4 (H) 80.0 - 100.0 fL  MCH 34.7 (H) 26.0 - 34.0 pg   MCHC 32.9 30.0 - 36.0 g/dL   RDW 85.6 88.4 - 84.4 %   Platelet Count 160 150 - 400 K/uL   nRBC 0.0 0.0 - 0.2 %   Neutrophils Relative % 40 %   Neutro Abs 2.2 1.7 - 7.7 K/uL   Lymphocytes Relative 47 %   Lymphs Abs 2.6 0.7 - 4.0 K/uL   Monocytes Relative 10 %    Monocytes Absolute 0.5 0.1 - 1.0 K/uL   Eosinophils Relative 1 %   Eosinophils Absolute 0.1 0.0 - 0.5 K/uL   Basophils Relative 1 %   Basophils Absolute 0.1 0.0 - 0.1 K/uL   Immature Granulocytes 1 %   Abs Immature Granulocytes 0.04 0.00 - 0.07 K/uL    Comment: Performed at [facility], [address] [place], [place] [address]  CA 125     Status: None   Collection Time: 04/24/23  7:58 AM  Result Value Ref Range   Cancer Antigen (CA) 125 7.7 0.0 - 38.1 U/mL    Comment: (NOTE) Roche Diagnostics Electrochemiluminescence Immunoassay (ECLIA) Values obtained with different assay methods or kits cannot be used interchangeably.  Results cannot be interpreted as absolute evidence of the presence or absence of malignant disease. Performed At: [facility] [address] [place], [place] [id number] Jennette Shorter MD Ey:[id number]   CMP (Cancer Center only)     Status: Abnormal   Collection Time: 04/24/23  7:58 AM  Result Value Ref Range   Sodium 138 135 - 145 mmol/L   Potassium 3.8 3.5 - 5.1 mmol/L   Chloride 103 98 - 111 mmol/L   CO2 24 22 - 32 mmol/L   Glucose, Bld 170 (H) 70 - 99 mg/dL    Comment: Glucose reference range applies only to samples taken after fasting for at least 8 hours.   BUN 28 (H) 6 - 20 mg/dL   Creatinine 8.98 (H) 9.55 - 1.00 mg/dL   Calcium 8.9 8.9 - 89.6 mg/dL   Total Protein 6.9 6.5 - 8.1 g/dL   Albumin 3.8 3.5 - 5.0 g/dL   AST 23 15 - 41 U/L   ALT 23 0 - 44 U/L   Alkaline Phosphatase 49 38 - 126 U/L   Total Bilirubin 0.4 0.0 - 1.2 mg/dL   GFR, Estimated >39 >39 mL/min    Comment: (NOTE) Calculated using the CKD-EPI Creatinine Equation (2021)    Anion gap 11 5 - 15    Comment: Performed at [facility], [address] [place], [place] [address]  CMP (Cancer Center only)     Status: Abnormal   Collection Time: 05/15/23  8:05 AM  Result Value Ref Range   Sodium 136 135 - 145 mmol/L   Potassium 4.2 3.5 - 5.1 mmol/L   Chloride 102  98 - 111 mmol/L   CO2 21 (L) 22 - 32 mmol/L   Glucose, Bld 235 (H) 70 - 99 mg/dL    Comment: Glucose reference range applies only to samples taken after fasting for at least 8 hours.   BUN 28 (H) 6 - 20 mg/dL   Creatinine 8.97 (H) 9.55 - 1.00 mg/dL   Calcium 8.7 (L) 8.9 - 10.3 mg/dL   Total Protein 6.9 6.5 - 8.1 g/dL   Albumin 3.5 3.5 - 5.0 g/dL   AST 26 15 - 41 U/L   ALT 32 0 - 44 U/L   Alkaline Phosphatase 50 38 - 126 U/L  Total Bilirubin 0.4 0.0 - 1.2 mg/dL   GFR, Estimated >39 >39 mL/min    Comment: (NOTE) Calculated using the CKD-EPI Creatinine Equation (2021)    Anion gap 13 5 - 15    Comment: Performed at [facility], [address] [place], [place] [address]  CA 125     Status: None   Collection Time: 05/15/23  8:05 AM  Result Value Ref Range   Cancer Antigen (CA) 125 8.7 0.0 - 38.1 U/mL    Comment: (NOTE) Roche [employer]ostics Electrochemiluminescence Immunoassay (ECLIA) Values obtained with different assay methods or kits cannot be used interchangeably.  Results cannot be interpreted as absolute evidence of the presence or absence of malignant disease. Performed At: [facility] [address] [place], [place] [id number] Jennette Shorter MD Ey:[id number]   CBC with Differential (Cancer Center Only)     Status: Abnormal   Collection Time: 05/15/23  8:05 AM  Result Value Ref Range   WBC Count 6.6 4.0 - 10.5 K/uL   RBC 2.92 (L) 3.87 - 5.11 MIL/uL   Hemoglobin 10.2 (L) 12.0 - 15.0 g/dL   HCT 69.6 (L) 63.9 - 53.9 %   MCV 103.8 (H) 80.0 - 100.0 fL   MCH 34.9 (H) 26.0 - 34.0 pg   MCHC 33.7 30.0 - 36.0 g/dL   RDW 85.2 88.4 - 84.4 %   Platelet Count 119 (L) 150 - 400 K/uL   nRBC 0.5 (H) 0.0 - 0.2 %   Neutrophils Relative % 39 %   Neutro Abs 2.6 1.7 - 7.7 K/uL   Lymphocytes Relative 46 %   Lymphs Abs 3.1 0.7 - 4.0 K/uL   Monocytes Relative 12 %   Monocytes Absolute 0.8 0.1 - 1.0 K/uL   Eosinophils Relative 1 %   Eosinophils Absolute 0.0 0.0 - 0.5 K/uL    Basophils Relative 1 %   Basophils Absolute 0.1 0.0 - 0.1 K/uL   Immature Granulocytes 1 %   Abs Immature Granulocytes 0.07 0.00 - 0.07 K/uL    Comment: Performed at [facility], [address] [place], [place] [address]  Retic Panel     Status: Abnormal   Collection Time: 06/06/23  7:56 AM  Result Value Ref Range   Retic Ct Pct 4.0 (H) 0.4 - 3.1 %   RBC. 2.83 (L) 3.87 - 5.11 MIL/uL   Retic Count, Absolute 111.8 19.0 - 186.0 K/uL   Immature Retic Fract 32.7 (H) 2.3 - 15.9 %   Reticulocyte Hemoglobin 35.7 >27.9 pg    Comment:        Given the high negative predictive value of a RET-He result > 32 pg iron  deficiency is essentially excluded. If this patient is anemic other etiologies should be considered. Performed at [facility], [address] [place], [place] [address]   CMP (Cancer Center only)     Status: Abnormal   Collection Time: 06/06/23  7:56 AM  Result Value Ref Range   Sodium 135 135 - 145 mmol/L   Potassium 4.5 3.5 - 5.1 mmol/L   Chloride 104 98 - 111 mmol/L   CO2 22 22 - 32 mmol/L   Glucose, Bld 220 (H) 70 - 99 mg/dL    Comment: Glucose reference range applies only to samples taken after fasting for at least 8 hours.   BUN 30 (H) 6 - 20 mg/dL   Creatinine 8.89 (H) 9.55 - 1.00 mg/dL   Calcium 8.7 (L) 8.9 - 10.3 mg/dL   Total Protein  6.8 6.5 - 8.1 g/dL   Albumin 3.5 3.5 - 5.0 g/dL   AST 26 15 - 41 U/L   ALT 30 0 - 44 U/L   Alkaline Phosphatase 42 38 - 126 U/L   Total Bilirubin 0.3 0.0 - 1.2 mg/dL   GFR, Estimated 58 (L) >60 mL/min    Comment: (NOTE) Calculated using the CKD-EPI Creatinine Equation (2021)    Anion gap 9 5 - 15    Comment: Performed at [facility], [address] [place], [place] [address]  CBC with Differential (Cancer Center Only)     Status: Abnormal   Collection Time: 06/06/23  7:56 AM  Result Value Ref Range   WBC Count 5.8 4.0 - 10.5 K/uL   RBC 2.77 (L) 3.87 - 5.11 MIL/uL   Hemoglobin 9.5 (L) 12.0 - 15.0  g/dL   HCT 71.4 (L) 63.9 - 53.9 %   MCV 102.9 (H) 80.0 - 100.0 fL   MCH 34.3 (H) 26.0 - 34.0 pg   MCHC 33.3 30.0 - 36.0 g/dL   RDW 83.5 (H) 88.4 - 84.4 %   Platelet Count 142 (L) 150 - 400 K/uL   nRBC 0.0 0.0 - 0.2 %   Neutrophils Relative % 43 %   Neutro Abs 2.5 1.7 - 7.7 K/uL   Lymphocytes Relative 42 %   Lymphs Abs 2.4 0.7 - 4.0 K/uL   Monocytes Relative 12 %   Monocytes Absolute 0.7 0.1 - 1.0 K/uL   Eosinophils Relative 1 %   Eosinophils Absolute 0.0 0.0 - 0.5 K/uL   Basophils Relative 1 %   Basophils Absolute 0.1 0.0 - 0.1 K/uL   Immature Granulocytes 1 %   Abs Immature Granulocytes 0.08 (H) 0.00 - 0.07 K/uL    Comment: Performed at [facility], [address] [place], [place] [address]  Ferritin     Status: None   Collection Time: 06/06/23  7:56 AM  Result Value Ref Range   Ferritin 84 11 - 307 ng/mL    Comment: Performed at [facility], [address] [place], [place] [address]  Iron  and TIBC     Status: None   Collection Time: 06/06/23  7:56 AM  Result Value Ref Range   Iron  89 28 - 170 ug/dL   TIBC 579 749 - 549 ug/dL   Saturation Ratios 21 10.4 - 31.8 %   UIBC 331 ug/dL    Comment: Performed at [facility], [address] [place], [place] [address]  CA 125     Status: None   Collection Time: 06/06/23  7:56 AM  Result Value Ref Range   Cancer Antigen (CA) 125 8.1 0.0 - 38.1 U/mL    Comment: (NOTE) Roche Diagnostics Electrochemiluminescence Immunoassay (ECLIA) Values obtained with different assay methods or kits cannot be used interchangeably.  Results cannot be interpreted as absolute evidence of the presence or absence of malignant disease. Performed At: [facility] [address] [place], [place] [id number] Jennette Shorter MD Ey:[id number]   Magnesium      Status: Abnormal   Collection Time: 06/20/23 11:29 AM  Result Value Ref Range   Magnesium  1.5 (L) 1.7 - 2.4 mg/dL    Comment: Performed at [facility],  [address] [place], [place] [address]  CMP (Cancer Center only)     Status: Abnormal   Collection Time: 06/20/23 11:29 AM  Result Value Ref Range   Sodium 134 (L) 135 - 145 mmol/L   Potassium 4.3  3.5 - 5.1 mmol/L   Chloride 106 98 - 111 mmol/L   CO2 20 (L) 22 - 32 mmol/L   Glucose, Bld 204 (H) 70 - 99 mg/dL    Comment: Glucose reference range applies only to samples taken after fasting for at least 8 hours.   BUN 36 (H) 6 - 20 mg/dL   Creatinine 8.67 (H) 9.55 - 1.00 mg/dL   Calcium 8.5 (L) 8.9 - 10.3 mg/dL   Total Protein 7.3 6.5 - 8.1 g/dL   Albumin 3.8 3.5 - 5.0 g/dL   AST 25 15 - 41 U/L   ALT 33 0 - 44 U/L   Alkaline Phosphatase 45 38 - 126 U/L   Total Bilirubin 0.4 0.0 - 1.2 mg/dL   GFR, Estimated 47 (L) >60 mL/min    Comment: (NOTE) Calculated using the CKD-EPI Creatinine Equation (2021)    Anion gap 8 5 - 15    Comment: Performed at [facility], [address] [place], [place] [address]  CBC with Differential (Cancer Center Only)     Status: Abnormal   Collection Time: 06/20/23 11:29 AM  Result Value Ref Range   WBC Count 7.1 4.0 - 10.5 K/uL   RBC 3.14 (L) 3.87 - 5.11 MIL/uL   Hemoglobin 10.5 (L) 12.0 - 15.0 g/dL   HCT 67.3 (L) 63.9 - 53.9 %   MCV 103.8 (H) 80.0 - 100.0 fL   MCH 33.4 26.0 - 34.0 pg   MCHC 32.2 30.0 - 36.0 g/dL   RDW 83.1 (H) 88.4 - 84.4 %   Platelet Count 325 150 - 400 K/uL   nRBC 0.3 (H) 0.0 - 0.2 %   Neutrophils Relative % 56 %   Neutro Abs 3.9 1.7 - 7.7 K/uL   Lymphocytes Relative 26 %   Lymphs Abs 1.9 0.7 - 4.0 K/uL   Monocytes Relative 9 %   Monocytes Absolute 0.6 0.1 - 1.0 K/uL   Eosinophils Relative 9 %   Eosinophils Absolute 0.7 (H) 0.0 - 0.5 K/uL   Basophils Relative 0 %   Basophils Absolute 0.0 0.0 - 0.1 K/uL   Immature Granulocytes 0 %   Abs Immature Granulocytes 0.03 0.00 - 0.07 K/uL    Comment: Performed at [facility], [address] [place], [place] [address]  Gastrointestinal Panel by PCR , Stool      Status: None   Collection Time: 06/20/23 12:22 PM   Specimen: Stool  Result Value Ref Range   Campylobacter species NOT DETECTED NOT DETECTED   Plesimonas shigelloides NOT DETECTED NOT DETECTED   Salmonella species NOT DETECTED NOT DETECTED   Yersinia enterocolitica NOT DETECTED NOT DETECTED   Vibrio species NOT DETECTED NOT DETECTED   Vibrio cholerae NOT DETECTED NOT DETECTED   Enteroaggregative E coli (EAEC) NOT DETECTED NOT DETECTED   Enteropathogenic E coli (EPEC) NOT DETECTED NOT DETECTED   Enterotoxigenic E coli (ETEC) NOT DETECTED NOT DETECTED   Shiga like toxin producing E coli (STEC) NOT DETECTED NOT DETECTED   Shigella/Enteroinvasive E coli (EIEC) NOT DETECTED NOT DETECTED   Cryptosporidium NOT DETECTED NOT DETECTED   Cyclospora cayetanensis NOT DETECTED NOT DETECTED   Entamoeba histolytica NOT DETECTED NOT DETECTED   Giardia lamblia NOT DETECTED NOT DETECTED   Adenovirus F40/41 NOT DETECTED NOT DETECTED   Astrovirus NOT DETECTED NOT DETECTED   Norovirus GI/GII NOT DETECTED NOT DETECTED   Rotavirus A NOT DETECTED NOT DETECTED   Sapovirus (I, II, IV, and V) NOT DETECTED NOT DETECTED  Comment: Performed at [facility], [address] [place], [place] [address]  C difficile quick screen w PCR reflex     Status: None   Collection Time: 06/20/23 12:29 PM   Specimen: STOOL  Result Value Ref Range   C Diff antigen NEGATIVE NEGATIVE   C Diff toxin NEGATIVE NEGATIVE   C Diff interpretation No C. difficile detected.     Comment: Performed at [facility], [address] [place], [place] [address]  Magnesium      Status: Abnormal   Collection Time: 06/23/23  8:49 AM  Result Value Ref Range   Magnesium  1.5 (L) 1.7 - 2.4 mg/dL    Comment: Performed at [facility], [address] [place], [place] [address]  CMP (Cancer Center only)     Status: Abnormal   Collection Time: 06/23/23  8:49 AM  Result Value Ref Range   Sodium 137 135 - 145 mmol/L    Potassium 4.4 3.5 - 5.1 mmol/L   Chloride 107 98 - 111 mmol/L   CO2 21 (L) 22 - 32 mmol/L   Glucose, Bld 157 (H) 70 - 99 mg/dL    Comment: Glucose reference range applies only to samples taken after fasting for at least 8 hours.   BUN 28 (H) 6 - 20 mg/dL   Creatinine 9.08 9.55 - 1.00 mg/dL   Calcium 9.0 8.9 - 89.6 mg/dL   Total Protein 6.8 6.5 - 8.1 g/dL   Albumin 3.7 3.5 - 5.0 g/dL   AST 26 15 - 41 U/L   ALT 28 0 - 44 U/L   Alkaline Phosphatase 44 38 - 126 U/L   Total Bilirubin 0.5 0.0 - 1.2 mg/dL   GFR, Estimated >39 >39 mL/min    Comment: (NOTE) Calculated using the CKD-EPI Creatinine Equation (2021)    Anion gap 9 5 - 15    Comment: Performed at [facility], [address] [place], [place] [address]  CBC with Differential (Cancer Center Only)     Status: Abnormal   Collection Time: 06/23/23  8:49 AM  Result Value Ref Range   WBC Count 6.8 4.0 - 10.5 K/uL   RBC 2.92 (L) 3.87 - 5.11 MIL/uL   Hemoglobin 9.9 (L) 12.0 - 15.0 g/dL   HCT 69.6 (L) 63.9 - 53.9 %   MCV 103.8 (H) 80.0 - 100.0 fL   MCH 33.9 26.0 - 34.0 pg   MCHC 32.7 30.0 - 36.0 g/dL   RDW 83.4 (H) 88.4 - 84.4 %   Platelet Count 268 150 - 400 K/uL   nRBC 0.3 (H) 0.0 - 0.2 %   Neutrophils Relative % 46 %   Neutro Abs 3.1 1.7 - 7.7 K/uL   Lymphocytes Relative 35 %   Lymphs Abs 2.4 0.7 - 4.0 K/uL   Monocytes Relative 9 %   Monocytes Absolute 0.6 0.1 - 1.0 K/uL   Eosinophils Relative 8 %   Eosinophils Absolute 0.6 (H) 0.0 - 0.5 K/uL   Basophils Relative 1 %   Basophils Absolute 0.1 0.0 - 0.1 K/uL   Immature Granulocytes 1 %   Abs Immature Granulocytes 0.07 0.00 - 0.07 K/uL    Comment: Performed at [facility], [address] [place], [place] [address]  CA 125     Status: None   Collection Time: 06/27/23  9:31 AM  Result Value Ref Range   Cancer Antigen (CA) 125 6.8 0.0 - 38.1 U/mL    Comment: (NOTE) Roche [employer]ostics Electrochemiluminescence  Immunoassay (ECLIA) Values obtained with different  assay methods or kits cannot be used interchangeably.  Results cannot be interpreted as absolute evidence of the presence or absence of malignant disease. Performed At: [facility] [address] [place], [place] [id number] Jennette Shorter MD Ey:[id number]   CBC with Differential (Cancer Center Only)     Status: Abnormal   Collection Time: 06/27/23  9:31 AM  Result Value Ref Range   WBC Count 8.4 4.0 - 10.5 K/uL   RBC 3.02 (L) 3.87 - 5.11 MIL/uL   Hemoglobin 10.3 (L) 12.0 - 15.0 g/dL   HCT 68.2 (L) 63.9 - 53.9 %   MCV 105.0 (H) 80.0 - 100.0 fL   MCH 34.1 (H) 26.0 - 34.0 pg   MCHC 32.5 30.0 - 36.0 g/dL   RDW 83.2 (H) 88.4 - 84.4 %   Platelet Count 235 150 - 400 K/uL   nRBC 0.0 0.0 - 0.2 %   Neutrophils Relative % 52 %   Neutro Abs 4.4 1.7 - 7.7 K/uL   Lymphocytes Relative 32 %   Lymphs Abs 2.7 0.7 - 4.0 K/uL   Monocytes Relative 8 %   Monocytes Absolute 0.7 0.1 - 1.0 K/uL   Eosinophils Relative 6 %   Eosinophils Absolute 0.5 0.0 - 0.5 K/uL   Basophils Relative 1 %   Basophils Absolute 0.1 0.0 - 0.1 K/uL   Immature Granulocytes 1 %   Abs Immature Granulocytes 0.08 (H) 0.00 - 0.07 K/uL    Comment: Performed at [facility], [address] [place], [place] [address]  CMP (Cancer Center only)     Status: Abnormal   Collection Time: 06/27/23  9:31 AM  Result Value Ref Range   Sodium 134 (L) 135 - 145 mmol/L   Potassium 4.6 3.5 - 5.1 mmol/L   Chloride 102 98 - 111 mmol/L   CO2 23 22 - 32 mmol/L   Glucose, Bld 256 (H) 70 - 99 mg/dL    Comment: Glucose reference range applies only to samples taken after fasting for at least 8 hours.   BUN 29 (H) 6 - 20 mg/dL   Creatinine 9.02 9.55 - 1.00 mg/dL   Calcium 8.9 8.9 - 89.6 mg/dL   Total Protein 7.0 6.5 - 8.1 g/dL   Albumin 3.6 3.5 - 5.0 g/dL   AST 26 15 - 41 U/L   ALT 28 0 - 44 U/L   Alkaline Phosphatase 53 38 - 126 U/L   Total Bilirubin 0.6 0.0 - 1.2 mg/dL   GFR, Estimated >39 >39 mL/min    Comment:  (NOTE) Calculated using the CKD-EPI Creatinine Equation (2021)    Anion gap 9 5 - 15    Comment: Performed at [facility], [address] [place], [place] [address]  Magnesium      Status: Abnormal   Collection Time: 06/27/23  9:31 AM  Result Value Ref Range   Magnesium  1.5 (L) 1.7 - 2.4 mg/dL    Comment: Performed at [facility], [address] [place], [place] [address]  Lipid Profile     Status: Abnormal   Collection Time: 07/10/23  2:46 PM  Result Value Ref Range   Cholesterol, Total 138 100 - 199 mg/dL   Triglycerides 838 (H) 0 - 149 mg/dL   HDL 41 >60 mg/dL   VLDL Cholesterol Cal 28 5 - 40 mg/dL   LDL Chol Calc (NIH) 69 0 - 99 mg/dL   Chol/HDL Ratio 3.4 0.0 - 4.4 ratio  Comment:                                   T. Chol/HDL Ratio                                             Men  Women                               1/2 Avg.Risk  3.4    3.3                                   Avg.Risk  5.0    4.4                                2X Avg.Risk  9.6    7.1                                3X Avg.Risk 23.4   11.0   Hemoglobin A1c     Status: Abnormal   Collection Time: 07/10/23  2:46 PM  Result Value Ref Range   Hgb A1c MFr Bld 7.6 (H) 4.8 - 5.6 %    Comment:          Prediabetes: 5.7 - 6.4          Diabetes: >6.4          Glycemic control for adults with diabetes: <7.0    Est. average glucose Bld gHb Est-mCnc 171 mg/dL  Vitamin B12     Status: None   Collection Time: 07/10/23  2:46 PM  Result Value Ref Range   Vitamin B-12 434 232 - 1,245 pg/mL  Vitamin D  (25 hydroxy)     Status: Abnormal   Collection Time: 07/10/23  2:46 PM  Result Value Ref Range   Vit D, 25-Hydroxy 24.2 (L) 30.0 - 100.0 ng/mL    Comment: Vitamin D  deficiency has been defined by the Institute of Medicine and an Endocrine Society practice guideline as a level of serum 25-OH vitamin D  less than 20 ng/mL (1,2). The Endocrine Society went on to further define vitamin D  insufficiency as a level  between 21 and 29 ng/mL (2). 1. IOM (Institute of Medicine). 2010. Dietary reference    intakes for calcium and D. [place]: The    [employer]. 2. Holick MF, Binkley Sanford, Bischoff-Ferrari HA, et al.    Evaluation, treatment, and prevention of vitamin D     deficiency: an Endocrine Society clinical practice    guideline. JCEM. 2011 Jul; 96(7):1911-30.        Assessment & Plan Essential hypertension Blood pressure well controlled with current medications.  Continue current therapy.  Will reassess at follow up.   Type 2 diabetes mellitus with hyperglycemia, with long-term current use of insulin  (HCC) Continue current diabetes POC, as patient has been improving on current regimen.  Will adjust meds if needed based on labs.   Vitamin D  deficiency, unspecified B12 deficiency due to diet Will continue supplements as needed.   Mixed hyperlipidemia Continue current therapy  for lipid control. Will modify as needed based on labwork results.   Obesity, Class III, BMI 40-49.9 (morbid obesity) Continue current meds.  Will adjust as needed based on results.  The patient is asked to make an attempt to improve diet and exercise patterns to aid in medical management of this problem. Addressed importance of increasing and maintaining water  intake.   Chronic obstructive pulmonary disease, unspecified COPD type (HCC) Patient is seen by pulmonary, who manage this condition.  She is well controlled with current therapy.   Will defer to them for further changes to plan of care.  Peritoneal carcinomatosis [facility]) Patient is seen by oncology, who manage this condition.  She is well controlled with current therapy.   Will defer to them for further changes to plan of care.  Chronic heart failure, unspecified heart failure type [facility]) Patient is seen by cardiology, who manage this condition.  She is well controlled with current therapy.   Will defer to them for further changes to  plan of care.     Return in about 3 months (around 10/17/2023) for F/U.   Total time spent: 20 minutes  ALAN CHRISTELLA ARRANT, FNP  07/17/2023   This document may have been prepared by [facility] Voice Recognition software and as such may include unintentional dictation errors.

## 2023-07-18 ENCOUNTER — Encounter: Payer: Self-pay | Admitting: Oncology

## 2023-07-21 ENCOUNTER — Ambulatory Visit
Admission: RE | Admit: 2023-07-21 | Discharge: 2023-07-21 | Disposition: A | Discharge status: Home / Self Care | Payer: Self-pay | Source: Ambulatory Visit | Attending: Oncology | Admitting: Oncology

## 2023-07-21 ENCOUNTER — Encounter: Payer: Self-pay | Admitting: Oncology

## 2023-07-21 DIAGNOSIS — M542 Cervicalgia: Secondary | ICD-10-CM

## 2023-07-21 MED ORDER — SODIUM CHLORIDE 0.9% FLUSH
10.0000 mL | INTRAVENOUS | Status: DC | PRN
Start: 2023-07-21 — End: 2023-07-22
  Administered 2023-07-21: 10 mL via INTRAVENOUS

## 2023-07-21 MED ORDER — HEPARIN SOD (PORK) LOCK FLUSH 100 UNIT/ML IV SOLN
500.0000 [IU] | Freq: Once | INTRAVENOUS | Status: AC
  Administered 2023-07-21: 500 [IU] via INTRAVENOUS

## 2023-07-23 ENCOUNTER — Other Ambulatory Visit

## 2023-07-23 ENCOUNTER — Encounter: Payer: Self-pay | Admitting: Oncology

## 2023-07-23 ENCOUNTER — Inpatient Hospital Stay (HOSPITAL_BASED_OUTPATIENT_CLINIC_OR_DEPARTMENT_OTHER): Payer: Self-pay | Admitting: Oncology

## 2023-07-23 ENCOUNTER — Inpatient Hospital Stay

## 2023-07-23 ENCOUNTER — Inpatient Hospital Stay: Payer: Self-pay

## 2023-07-23 VITALS — BP 157/64 | HR 79 | Temp 98.8°F | Resp 18 | Ht 69.0 in | Wt 279.3 lb

## 2023-07-23 DIAGNOSIS — Z5111 Encounter for antineoplastic chemotherapy: Secondary | ICD-10-CM

## 2023-07-23 DIAGNOSIS — R110 Nausea: Secondary | ICD-10-CM

## 2023-07-23 DIAGNOSIS — Z95828 Presence of other vascular implants and grafts: Secondary | ICD-10-CM

## 2023-07-23 DIAGNOSIS — C569 Malignant neoplasm of unspecified ovary: Secondary | ICD-10-CM

## 2023-07-23 DIAGNOSIS — K521 Toxic gastroenteritis and colitis: Secondary | ICD-10-CM

## 2023-07-23 DIAGNOSIS — C786 Secondary malignant neoplasm of retroperitoneum and peritoneum: Secondary | ICD-10-CM | POA: Diagnosis not present

## 2023-07-23 DIAGNOSIS — T451X5A Adverse effect of antineoplastic and immunosuppressive drugs, initial encounter: Secondary | ICD-10-CM

## 2023-07-23 DIAGNOSIS — G629 Polyneuropathy, unspecified: Secondary | ICD-10-CM

## 2023-07-23 LAB — CMP (CANCER CENTER ONLY)
ALT: 31 U/L (ref 0–44)
AST: 25 U/L (ref 15–41)
Albumin: 3.8 g/dL (ref 3.5–5.0)
Alkaline Phosphatase: 48 U/L (ref 38–126)
Anion gap: 8 (ref 5–15)
BUN: 31 mg/dL — ABNORMAL HIGH (ref 6–20)
CO2: 23 mmol/L (ref 22–32)
Calcium: 9 mg/dL (ref 8.9–10.3)
Chloride: 105 mmol/L (ref 98–111)
Creatinine: 0.98 mg/dL (ref 0.44–1.00)
GFR, Estimated: >60 mL/min (ref >60)
Glucose, Bld: 210 mg/dL — ABNORMAL HIGH (ref 70–99)
Potassium: 4.3 mmol/L (ref 3.5–5.1)
Sodium: 136 mmol/L (ref 135–145)
Total Bilirubin: 0.4 mg/dL (ref 0.0–1.2)
Total Protein: 6.9 g/dL (ref 6.5–8.1)

## 2023-07-23 LAB — CBC WITH DIFFERENTIAL (CANCER CENTER ONLY)
Abs Immature Granulocytes: 0.05 K/uL (ref 0.00–0.07)
Basophils Absolute: 0.1 K/uL (ref 0.0–0.1)
Basophils Relative: 1 %
Eosinophils Absolute: 0.8 K/uL — ABNORMAL HIGH (ref 0.0–0.5)
Eosinophils Relative: 10 %
HCT: 32.4 % — ABNORMAL LOW (ref 36.0–46.0)
Hemoglobin: 10.6 g/dL — ABNORMAL LOW (ref 12.0–15.0)
Immature Granulocytes: 1 %
Lymphocytes Relative: 30 %
Lymphs Abs: 2.2 K/uL (ref 0.7–4.0)
MCH: 34.2 pg — ABNORMAL HIGH (ref 26.0–34.0)
MCHC: 32.7 g/dL (ref 30.0–36.0)
MCV: 104.5 fL — ABNORMAL HIGH (ref 80.0–100.0)
Monocytes Absolute: 0.5 K/uL (ref 0.1–1.0)
Monocytes Relative: 7 %
Neutro Abs: 3.8 K/uL (ref 1.7–7.7)
Neutrophils Relative %: 51 %
Platelet Count: 216 K/uL (ref 150–400)
RBC: 3.1 MIL/uL — ABNORMAL LOW (ref 3.87–5.11)
RDW: 14.1 % (ref 11.5–15.5)
WBC Count: 7.5 K/uL (ref 4.0–10.5)
nRBC: 0 % (ref 0.0–0.2)

## 2023-07-23 LAB — MAGNESIUM: Magnesium: 1.8 mg/dL (ref 1.7–2.4)

## 2023-07-23 MED ORDER — OLAPARIB 150 MG PO TABS: 150.0000 mg | ORAL_TABLET | Freq: Two times a day (BID) | ORAL | 2 refills | Status: DC

## 2023-07-23 MED ORDER — ALPRAZOLAM 0.5 MG PO TABS: 0.5000 mg | ORAL_TABLET | ORAL | 0 refills | Status: DC

## 2023-07-23 MED ORDER — HEPARIN SOD (PORK) LOCK FLUSH 100 UNIT/ML IV SOLN
500.0000 [IU] | Freq: Once | INTRAVENOUS | Status: AC
  Administered 2023-07-23: 500 [IU] via INTRAVENOUS
  Filled 2023-07-23: qty 5

## 2023-07-23 NOTE — Assessment & Plan Note (Signed)
 Continue home antiemetics PRN.

## 2023-07-23 NOTE — Assessment & Plan Note (Addendum)
 Recommend Imodium as needed.- she may use medication prophylactically.

## 2023-07-23 NOTE — Assessment & Plan Note (Addendum)
 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 Post treatment CT showed excellent response. S/p Debulking R1 on 02/20/2023, s/p 2 cycles of adjuvant carboplatin  taxol  Repeat CT results were reviewed with patient- minimal residual peritoneal thickening.  Discussed case with Gynonc Dr. Mancil, recommend additional 2 cycles followed by Olaparib  maintenance.  Labs are reviewed and discussed with patient. She did not tolerate Olaparib  300mg  BID due to severe diarrhea.   Tolerates dose reduced olaparib  150 mg twice daily better. Continue   Aspirin  81mg  daily for thrombosis prophylaxis. .  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

## 2023-07-23 NOTE — Assessment & Plan Note (Signed)
 Plan port flush every 6 to 8 weeks if not used.

## 2023-07-23 NOTE — Assessment & Plan Note (Signed)
 Olaparib treatment as planned.

## 2023-07-23 NOTE — Progress Notes (Signed)
 Hematology/Oncology Progress note [phone] [phone]      CHIEF COMPLAINTS  High grade serous carcinoma of ovaries.   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 Post treatment CT showed excellent response. S/p Debulking R1 on 02/20/2023, s/p 2 cycles of adjuvant carboplatin  taxol  Repeat CT results were reviewed with patient- minimal residual peritoneal thickening.  Discussed case with Gynonc Dr. Mancil, recommend additional 2 cycles followed by Olaparib  maintenance.  Labs are reviewed and discussed with patient. She did not tolerate Olaparib  300mg  BID due to severe diarrhea.   Tolerates dose reduced olaparib  150 mg twice daily better. Continue   Aspirin  81mg  daily for thrombosis prophylaxis. .  Genetic evaluation - BRIP1 mutation  Chemotherapy-induced nausea Continue home antiemetics PRN.    Chemotherapy induced diarrhea Recommend Imodium as needed.- she may use medication prophylactically.   Encounter for antineoplastic chemotherapy Olaparib  treatment as planned.   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  Grade 1/ intermittent neuropathy of fingertips and toes.  Continue Gabapentin  300mg  PRN  Port-A-Cath in place Plan port flush every 6 to 8 weeks if not used.   Follow up 4 weeks   All questions were answered. The patient knows to call the clinic with any problems, questions or concerns.  Zelphia Cap, MD, PhD [facility] Hematology Oncology 07/23/2023    HISTORY OF PRESENTING ILLNESS:  Nancy [name] 58 y.o. female presents to establish care for  peritoneal carcinomatosis I have reviewed her chart and materials related to her cancer extensively and collaborated history with the patient. 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 Aortic atherosclerosis    11/06/2022 Initial Diagnosis   Primary peritoneal carcinomatosis   -presented to emergency room for evaluation of abdominal pain/bloating, poor oral intake, nonbloody nonbilious vomiting  Patient underwent paracentesis, cytology showed malignant cells. IHC can not be added.    11/14/2022 Imaging   CT chest w contrast  1. Prominent subcentimeter right retrocrural lymph nodes, metastatic disease can not be excluded. 2. Nonspecific ground-glass nodule of the superior portion of the right lower lobe measuring 7 mm. Recommend attention on follow-up. 3. Small left pleural effusion. 4. Partially visualized large volume abdominal ascites and peritoneal thickening, volume of ascites appears increased when compared with the prior CT. 5. Coronary artery calcifications and aortic Atherosclerosis (ICD10-I70.0).   11/18/2022 Procedure   Medi port placement by IR   11/18/2022 Procedure   Omentum biopsy showed  1. Omentum, biopsy,  :   - INVOLVEMENT BY HIGH-GRADE SEROUS CARCINOMA OF GYNECOLOGIC ORIGIN.   Diagnosis Note : The carcinoma is positive for cytokeratin 7, PAX8, WT1, and ER.  Cytokeratin 20 and GATA3 are negative.  P53 is overexpressed.  The morphologic findings in conjunction with the pattern of immunohistochemistry support the  above diagnosis.   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 The report date is  11/25/2022.  The CancerNext-Expanded + RNAinsight  gene panel offered by [employer] and includes sequencing and rearrangement analysis for the following 71 genes: AIP, ALK, APC*, ATM*, AXIN2, BAP1, BARD1, BMPR1A, BRCA1*, BRCA2*, BRIP1*, CDC73, CDH1*,CDK4, CDKN1B, CDKN2A, CHEK2*, CTNNA1, DICER1, FH, FLCN, KIF1B, LZTR1, MAX, MEN1, MET, MLH1*, MSH2*, MSH3, MSH6*, MUTYH*, NF1*, NF2, NTHL1, PALB2*, PHOX2B, PMS2*, POT1, PRKAR1A, PTCH1, PTEN*, RAD51C*, RAD51D*,RB1, RET, SDHA, SDHAF2, SDHB, SDHC, SDHD, SMAD4, SMARCA4, SMARCB1, SMARCE1, STK11, SUFU, TMEM127, TP53*,TSC1, TSC2, VHL; EGFR, EGLN1, HOXB13, KIT, MITF, PDGFRA, POLD1 and POLE (sequencing only); EPCAM and GREM1 (deletion/duplication only).    01/13/2023 Imaging   CT chest abdomen pelvis w contrast showed  Decreasing ascites, trace residual. The areas of omental caking and nodularity along the perineum is decreasing as well with significant residual particularly in the upper anterior abdomen.   Stable small mesenteric and retroperitoneal nodes. Resolved left pleural effusion. No developing new mass lesion. Small left renal angiomyolipoma and upper pole nonobstructing renal stone.   02/20/2023 Surgery    Interval debulking R1 - robotic assisted laparoscopic total hysterectomy, bilateral salpingo-oophorectomy, lysis of adhesions, peritoneal stripping, and omentectomy with stage IIIC high grade serous carcinoma of bilateral ovaries   A. Uterus, bilateral fallopian tubes, and bilateral ovaries, total hysterectomy and bilateral salpingo-oophorectomy: Residual high grade serous carcinoma (1.6 cm) involving bilateral ovaries, left fallopian tube, and uterine serosa.   Additional findings:  Uterus with leiomyomata (up to 1.1 cm) Right fallopian tube with no pathologic diagnosis See synoptic report.   B. Peritoneal nodules, excision: High grade serous carcinoma (up to 0.1 cm).  C. Omentum, omentectomy:  High grade serous carcinoma (0.7 cm). Moderate treatment response (CRS2).     03/04/2023  Cancer Staging   Staging form: Ovary, AJCC 7th Edition - Pathologic stage from 03/04/2023: FIGO Stage IIIC (cM0) - Signed by Babara Call, MD on 03/13/2023 Stage prefix: Initial diagnosis Laterality: Bilateral   History of sepsis secondary to diabetic foot infection/osteomyelitis status post left BKA done on 09/24/2022,    Today she presents to for evaluation prior to chemotherapy. Accompanied by significant other.  Patient did not tolerate olaparib  300 mg twice daily due to severe diarrhea.  After stopping medication for a few weeks, she was able to resume Olarparib at 150mg  BID dosage.  Still has diarrhea, usually a few hours after night time dosage. She uses imodium PRN.  She is coping with the side effects. Manageable. No nausea vomiting.    MEDICAL HISTORY:  Past Medical History:  Diagnosis Date   Abnormal cardiovascular stress test 09/21/2018   Formatting of this note might be different from the original. Lexiscan Myoview 09/16/2018 revealed mild anterior ischemia   Adverse effect of motion 05/10/2014   AKI (acute kidney injury) (HCC) 10/22/2022   Anxiety    Arthritis    r knee   Asthma    Breast cyst 05/10/2014   CAD (coronary artery disease)    a.) LHC 06/04/2009 at [facility]; non-obstructive CAD. b.) CTA with FFR 10/08/2018: extensive mixed plaque proximal to mid LAD (51-69%); Coronary Ca score 217; FFR 0.71 dPDA, 0.86 mLAD, 0.87 dLCx.   CCF (congestive cardiac failure) (HCC) 06/06/2008   a.) 30% EF. b.) TTE 06/03/2011: EF >55%; triv MR. c.) TTE 04/27/2018: EF 55%; mild LVH; triv PR, mild MR/TR; G1DD.   Cellulitis of left lower extremity 09/18/2022   Chest pain with high risk for cardiac etiology 07/02/2016   Chronic use of opiate drug for therapeutic purpose 02/13/2022   Formatting of this note might be  different from the original. [facility] Pain Contract signed on 04/18/16 & updated 03/04/17; UDS done on 04/18/16.   Complication of anesthesia    Diabetic foot infection (HCC) 03/13/2015    Diabetic foot ulcer (HCC) 02/08/2021   Diabetic ulcer of left heel associated with type 2 diabetes mellitus (HCC) 02/08/2021   Eczema    Family history of adverse reaction to anesthesia    a.) PONV in mother and grandmother   Gas gangrene (HCC) 09/18/2022   GERD (gastroesophageal reflux disease) 05/10/2014   History of kidney stones    History of MSSA bacteremia due to osteomyelitis L foot 09/18/22 10/22/2022   HLD (hyperlipidemia)    Hyponatremia 03/13/2015   Migraines    Motion sickness    MSSA bacteremia 09/20/2022   Osteomyelitis (HCC) 09/18/2022   Other acute osteomyelitis, left ankle and foot (HCC) 02/13/2022   Panic attacks    Peripheral edema 04/27/2018   Pneumonia    PONV (postoperative nausea and vomiting)    Sepsis secondary to diabetic foot infection 03/13/2015   Sepsis secondary to diabetic foot infection 03/13/2015   T2DM (type 2 diabetes mellitus) (HCC)    Takotsubo cardiomyopathy / transient apical balooning syndrome / stress-induced cardiomyopathy 05/10/2014   Unspecified essential hypertension     SURGICAL HISTORY: Past Surgical History:  Procedure Laterality Date   AMPUTATION Left 09/19/2022   Procedure: AMPUTATION BELOW KNEE;  Surgeon: Jama Cordella MATSU, MD;  Location: [facility];  Service: Vascular;  Laterality: Left;   AMPUTATION Left 09/24/2022   Procedure: AMPUTATION BELOW KNEE WITH WOUND CLOSURE;  Surgeon: Jama Cordella MATSU, MD;  Location: [facility];  Service: Vascular;  Laterality: Left;   AMPUTATION TOE Left 03/16/2015   Procedure: left fifth toe amputation with incision and drainage;  Surgeon: Eva Gay, DPM;  Location: [facility];  Service: Podiatry;  Laterality: Left;   APPLICATION OF WOUND VAC Left 02/14/2021   Procedure: APPLICATION OF WOUND VAC;  Surgeon: Gay Eva, DPM;  Location: [facility];  Service: Podiatry;  Laterality: Left;   APPLICATION OF WOUND VAC Left 04/16/2021   Procedure: APPLICATION OF WOUND VAC;  Surgeon: Lowery Estefana RAMAN, DO;   Location: [facility];  Service: Plastics;  Laterality: Left;   CARDIAC CATHETERIZATION Left 06/04/2009   Procedure: CARDIAC CATHETERIZATION; Location: [facility]   CHOLECYSTECTOMY     DEBRIDEMENT AND CLOSURE WOUND Left 04/16/2021   Procedure: DEBRIDEMENT AND CLOSURE WOUND;  Surgeon: Lowery Estefana RAMAN, DO;  Location: [facility];  Service: Plastics;  Laterality: Left;  1 hour   IR IMAGING GUIDED PORT INSERTION  11/18/2022   IRRIGATION AND DEBRIDEMENT FOOT Left 02/08/2021   Procedure: IRRIGATION AND DEBRIDEMENT FOOT - LFT HEEL ULCER;  Surgeon: Gay Eva, DPM;  Location: [facility];  Service: Podiatry;  Laterality: Left;   IRRIGATION AND DEBRIDEMENT FOOT Left 02/14/2021   Procedure: IRRIGATION AND DEBRIDEMENT LEFT HEEL;  Surgeon: Gay Eva, DPM;  Location: [facility];  Service: Podiatry;  Laterality: Left;   kidney stone removal     KNEE ARTHROSCOPY W/ MENISCAL REPAIR     NOSE SURGERY  01/07/1989   due to fracture   TEE WITHOUT CARDIOVERSION N/A 09/26/2022   Procedure: TRANSESOPHAGEAL ECHOCARDIOGRAM;  Surgeon: Dewane Shiner, DO;  Location: [facility];  Service: Cardiovascular;  Laterality: N/A;   TOE AMPUTATION     second toe   TONSILLECTOMY     TOTAL KNEE ARTHROPLASTY  01/11/2011   Procedure: TOTAL KNEE ARTHROPLASTY;  Surgeon: Norleen LITTIE Gavel;  Location: [facility];  Service: Orthopedics;  Laterality: Right;  COMPUTER ASSISTED TOTAL KNEE REPLACEMENT    SOCIAL HISTORY: Social History   Socioeconomic History   Marital status: Married    Spouse name: Not on file   Number of children: Not on file   Years of education: Not on file   Highest education level: Not on file  Occupational History   Not on file  Tobacco Use   Smoking status: Former    Current packs/day: 0.00    Types: Cigarettes    Quit date: 03/12/1985    Years since quitting: 38.3   Smokeless tobacco: Never  Vaping Use   Vaping status: Never Used  Substance and Sexual Activity    Alcohol use: No   Drug use: No   Sexual activity: Yes    Birth control/protection: Post-menopausal  Other Topics Concern   Not on file  Social History Narrative   Wife, Barnie, at bedside. One indoor pet, dog.   Social Drivers of Corporate investment banker Strain: Not on file  Food Insecurity: No Food Insecurity (10/22/2022)   Hunger Vital Sign    Worried About Running Out of Food in the Last Year: Never true    Ran Out of Food in the Last Year: Never true  Transportation Needs: No Transportation Needs (10/22/2022)   PRAPARE - Administrator, Civil Service (Medical): No    Lack of Transportation (Non-Medical): No  Physical Activity: Not on file  Stress: Not on file  Social Connections: Not on file  Intimate Partner Violence: Not At Risk (10/22/2022)   Humiliation, Afraid, Rape, and Kick questionnaire    Fear of Current or Ex-Partner: No    Emotionally Abused: No    Physically Abused: No    Sexually Abused: No    FAMILY HISTORY: Family History  Problem Relation Age of Onset   Transient ischemic attack Mother    Asthma Mother    Hypertension Mother    Lung cancer Father 43   Hypertension Father    Asthma Sister    Anxiety disorder Sister    Asthma Sister    Hypertension Brother    Ovarian cancer Maternal Aunt 11   Melanoma Maternal Uncle        dx 45s   Lung cancer Paternal Aunt 58   Stomach cancer Maternal Grandmother    Colon cancer Maternal Grandmother    Lymphoma Maternal Grandmother    Melanoma Maternal Grandfather 72       sun exposure   Lung cancer Paternal Grandmother 57   Kidney cancer Paternal Grandfather        dx early   Crohn's disease Other    GER disease Other    Breast cancer Neg Hx     ALLERGIES:  is allergic to shellfish allergy, codeine, morphine  and codeine, rosuvastatin, percocet [oxycodone -acetaminophen ], sulfa antibiotics, and theophyllines.  MEDICATIONS:  Current Outpatient Medications  Medication Sig Dispense Refill    ALPRAZolam  (XANAX ) 0.5 MG tablet Take 1 tablet (0.5 mg total) by mouth See admin instructions. Take 1 tablet 30 minutes prior to imaging procedure, may take another tablet if needed. 2 tablet 0   acidophilus (RISAQUAD) CAPS capsule Take 1 capsule by mouth daily.     albuterol  (PROVENTIL  HFA;VENTOLIN  HFA) 108 (90 Base) MCG/ACT inhaler Inhale 2 puffs into the lungs every 4 (four) hours as needed for wheezing or shortness of breath. Reported on 03/29/2015 1 Inhaler 3   albuterol  (PROVENTIL ) (2.5 MG/3ML) 0.083% nebulizer solution Take  2.5 mg by nebulization as needed.     ALPRAZolam  (XANAX ) 0.5 MG tablet TAKE 1/2 TO 1 TABLET BY MOUTH AS NEEDED FOR PANIC ATTACKS 30 tablet 2   ascorbic acid  (VITAMIN C ) 500 MG tablet Take 1 tablet (500 mg total) by mouth 2 (two) times daily. 60 tablet 0   aspirin  EC 81 MG tablet Take 81 mg by mouth daily.     carvedilol  (COREG ) 6.25 MG tablet TAKE 1 TABLET BY MOUTH TWICE A DAY 180 tablet 1   cholecalciferol (VITAMIN D3) 25 MCG (1000 UNIT) tablet Take 1,000 Units by mouth daily.     Continuous Glucose Sensor (FREESTYLE LIBRE 2 SENSOR) MISC 1 each by Does not apply route every 14 (fourteen) days. 2 each 3   Cyanocobalamin  (VITAMIN B 12) 500 MCG TABS Take by mouth daily at 8 pm.     desvenlafaxine (PRISTIQ) 100 MG 24 hr tablet TAKE 1 TABLET BY MOUTH EVERY DAY 90 tablet 2   dexamethasone  (DECADRON ) 4 MG tablet Take 2 tablets (8mg ) by mouth daily starting the day after carboplatin  for 2 days. Take with food 30 tablet 1   dicyclomine  (BENTYL ) 10 MG capsule Take 1 capsule (10 mg total) by mouth 4 (four) times daily -  before meals and at bedtime. 60 capsule 1   diphenoxylate -atropine  (LOMOTIL ) 2.5-0.025 MG tablet Take 1 tablet by mouth 4 (four) times daily as needed for diarrhea or loose stools. 30 tablet 0   Ensure Max Protein (ENSURE MAX PROTEIN) LIQD Take 330 mLs (11 oz total) by mouth 2 (two) times daily.     fenofibrate  54 MG tablet TAKE 1 TABLET BY MOUTH EVERY DAY 30  tablet 11   gabapentin  (NEURONTIN ) 300 MG capsule TAKE 1 CAPSULE (300MG  TOTAL) BY MOUTH AT BEDTIME AS NEEDED 30 capsule 3   HYDROmorphone  (DILAUDID ) 2 MG tablet Take 1 tablet (2 mg total) by mouth every 4 (four) hours as needed for severe pain (pain score 7-10). 60 tablet 0   ibuprofen (ADVIL) 800 MG tablet TAKE 1 TABLET BY MOUTH EVERY 8 HOURS AS NEEDED 90 tablet 3   insulin  NPH-regular Human (70-30) 100 UNIT/ML injection Inject 15 Units into the skin 2 (two) times daily with a meal. 10 mL 11   iron  polysaccharides (NIFEREX) 150 MG capsule Take 1 capsule (150 mg total) by mouth daily. 30 capsule 2   lidocaine -prilocaine  (EMLA ) cream Apply to affected area once 30 g 3   lisinopril  (ZESTRIL ) 10 MG tablet Take 1 tablet (10 mg total) by mouth every morning AND 0.5 tablets (5 mg total) at bedtime. 45 tablet 11   lisinopril  (ZESTRIL ) 5 MG tablet Take 1 tablet (5 mg total) by mouth 2 (two) times daily. 180 tablet 1   metFORMIN  (GLUCOPHAGE ) 500 MG tablet TAKE TWO TABLETS EACH MORNING. AND 3 TABLETS IN THE EVENING AS DIRECTED 150 tablet 5   methocarbamol  (ROBAXIN ) 500 MG tablet Take 1 tablet (500 mg total) by mouth every 8 (eight) hours as needed for muscle spasms. 30 tablet 0   mirtazapine  (REMERON ) 7.5 MG tablet TAKE 1 TABLET BY MOUTH AT BEDTIME. 90 tablet 2   montelukast  (SINGULAIR ) 10 MG tablet TAKE 1 TABLET BY MOUTH EVERYDAY AT BEDTIME 30 tablet 5   Multiple Vitamin (MULTIVITAMIN WITH MINERALS) TABS tablet Take 1 tablet by mouth daily. 30 tablet 2   naloxone  (NARCAN ) nasal spray 4 mg/0.1 mL SPRAY 1 SPRAY INTO ONE NOSTRIL AS DIRECTED FOR OPIOID OVERDOSE (TURN PERSON ON SIDE AFTER DOSE. IF NO RESPONSE  IN 2-3 MINUTES OR PERSON RESPONDS BUT RELAPSES, REPEAT USING A NEW SPRAY DEVICE AND SPRAY INTO THE OTHER NOSTRIL. CALL 911 AFTER USE.) * EMERGENCY USE ONLY * 1 each 0   nystatin  (MYCOSTATIN /NYSTOP ) powder APPLY TOPICALLY TWICE DAILY AS NEEDED 30 g 3   nystatin  ointment (MYCOSTATIN ) Apply 1 Application  topically 2 (two) times daily. 30 g 0   olaparib  (LYNPARZA ) 150 MG tablet Take 1 tablet (150 mg total) by mouth 2 (two) times daily. Swallow whole. May take with food to decrease nausea and vomiting. 60 tablet 2   ondansetron  (ZOFRAN ) 8 MG tablet Take 1 tablet (8 mg total) by mouth every 8 (eight) hours as needed for nausea or vomiting. Start on the third day after carboplatin . 30 tablet 1   ondansetron  (ZOFRAN -ODT) 8 MG disintegrating tablet Take 1 tablet (8 mg total) by mouth every 8 (eight) hours as needed for nausea or vomiting. 90 tablet 3   pantoprazole  (PROTONIX ) 40 MG tablet TAKE 1 TABLET BY MOUTH EVERY DAY 90 tablet 3   pravastatin  (PRAVACHOL ) 40 MG tablet TAKE 1 TABLET BY MOUTH EVERY DAY 30 tablet 5   prochlorperazine  (COMPAZINE ) 10 MG tablet Take 1 tablet (10 mg total) by mouth every 6 (six) hours as needed for nausea or vomiting. 90 tablet 1   tirzepatide  (MOUNJARO ) 7.5 MG/0.5ML Pen Inject 7.5 mg into the skin once a week. 2 mL 0   UBRELVY  100 MG TABS TAKE 1 TABLET (100 MG TOTAL) BY MOUTH AS NEEDED (MIGRAINE). 12 tablet 1   zinc  sulfate 220 (50 Zn) MG capsule Take 1 capsule (220 mg total) by mouth daily. 30 capsule 0   No current facility-administered medications for this visit.    Review of Systems  Constitutional:  Negative for appetite change, chills, fatigue, fever and unexpected weight change.  HENT:   Negative for hearing loss and voice change.   Eyes:  Negative for eye problems.  Respiratory:  Negative for chest tightness, cough and shortness of breath.   Cardiovascular:  Negative for chest pain and leg swelling.  Gastrointestinal:  Positive for diarrhea. Negative for abdominal distention, abdominal pain and blood in stool.  Endocrine: Negative for hot flashes.  Genitourinary:  Negative for difficulty urinating and frequency.   Musculoskeletal:  Negative for arthralgias.       S/p left  BKA   Skin:  Negative for itching and rash.  Neurological:  Positive for numbness.  Negative for extremity weakness.  Hematological:  Negative for adenopathy.  Psychiatric/Behavioral:  Negative for confusion.      PHYSICAL EXAMINATION: ECOG PERFORMANCE STATUS: 1 - Symptomatic but completely ambulatory  Vitals:   07/23/23 0957  BP: (!) 157/64  Pulse: 79  Resp: 18  Temp: 98.8 F (37.1 C)  SpO2: 99%   Filed Weights   07/23/23 0957  Weight: 279 lb 4.8 oz (126.7 kg)    Physical Exam Constitutional:      General: She is not in acute distress.    Appearance: She is obese. She is not diaphoretic.  HENT:     Head: Normocephalic and atraumatic.     Mouth/Throat:     Pharynx: No oropharyngeal exudate.  Eyes:     General: No scleral icterus. Cardiovascular:     Rate and Rhythm: Normal rate and regular rhythm.  Pulmonary:     Effort: Pulmonary effort is normal. No respiratory distress.     Breath sounds: Normal breath sounds. No wheezing.  Abdominal:     General: There is no  distension.     Palpations: Abdomen is soft.     Tenderness: There is no abdominal tenderness.  Musculoskeletal:        General: Normal range of motion.     Cervical back: Normal range of motion and neck supple.     Comments: S/p left BKA  Skin:    General: Skin is warm and dry.     Findings: No erythema.  Neurological:     Mental Status: She is alert and oriented to person, place, and time. Mental status is at baseline.     Cranial Nerves: No cranial nerve deficit.     Motor: No abnormal muscle tone.  Psychiatric:        Mood and Affect: Affect normal.      LABORATORY DATA:  I have reviewed the data as listed    Latest Ref Rng & Units 07/23/2023    9:18 AM 06/27/2023    9:31 AM 06/23/2023    8:49 AM  CBC  WBC 4.0 - 10.5 K/uL 7.5  8.4  6.8   Hemoglobin 12.0 - 15.0 g/dL 89.3  89.6  9.9   Hematocrit 36.0 - 46.0 % 32.4  31.7  30.3   Platelets 150 - 400 K/uL 216  235  268       Latest Ref Rng & Units 07/23/2023    9:18 AM 06/27/2023    9:31 AM 06/23/2023    8:49 AM  CMP   Glucose 70 - 99 mg/dL 789  743  842   BUN 6 - 20 mg/dL 31  29  28    Creatinine 0.44 - 1.00 mg/dL 9.01  9.02  9.08   Sodium 135 - 145 mmol/L 136  134  137   Potassium 3.5 - 5.1 mmol/L 4.3  4.6  4.4   Chloride 98 - 111 mmol/L 105  102  107   CO2 22 - 32 mmol/L 23  23  21    Calcium 8.9 - 10.3 mg/dL 9.0  8.9  9.0   Total Protein 6.5 - 8.1 g/dL 6.9  7.0  6.8   Total Bilirubin 0.0 - 1.2 mg/dL 0.4  0.6  0.5   Alkaline Phos 38 - 126 U/L 48  53  44   AST 15 - 41 U/L 25  26  26    ALT 0 - 44 U/L 31  28  28       RADIOGRAPHIC STUDIES: I have personally reviewed the radiological images as listed and agreed with the findings in the report. No results found.

## 2023-07-23 NOTE — Progress Notes (Signed)
 Patient has no new or acute concerns.

## 2023-07-23 NOTE — Assessment & Plan Note (Signed)
 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  Grade 1/ intermittent neuropathy of fingertips and toes.  Continue Gabapentin 300mg  PRN

## 2023-07-24 LAB — CA 125: Cancer Antigen (CA) 125: 7 U/mL (ref 0.0–38.1)

## 2023-07-28 ENCOUNTER — Other Ambulatory Visit: Payer: Self-pay | Admitting: Family

## 2023-07-31 ENCOUNTER — Encounter: Payer: Self-pay | Admitting: Oncology

## 2023-07-31 ENCOUNTER — Ambulatory Visit (INDEPENDENT_AMBULATORY_CARE_PROVIDER_SITE_OTHER): Admitting: Family

## 2023-07-31 ENCOUNTER — Ambulatory Visit: Payer: Self-pay | Admitting: Family

## 2023-07-31 ENCOUNTER — Other Ambulatory Visit: Payer: Self-pay

## 2023-07-31 DIAGNOSIS — N390 Urinary tract infection, site not specified: Secondary | ICD-10-CM | POA: Diagnosis not present

## 2023-07-31 LAB — POCT URINALYSIS DIPSTICK
Bilirubin, UA: NEGATIVE
Glucose, UA: POSITIVE — AB
Ketones, UA: NEGATIVE
Nitrite, UA: POSITIVE
Protein, UA: POSITIVE — AB
Spec Grav, UA: >=1.03 — AB (ref 1.010–1.025)
Urobilinogen, UA: 0.2 U/dL
pH, UA: 5.5 (ref 5.0–8.0)

## 2023-07-31 MED ORDER — NITROFURANTOIN MONOHYD MACRO 100 MG PO CAPS: 100.0000 mg | ORAL_CAPSULE | Freq: Two times a day (BID) | ORAL | 0 refills | Status: AC

## 2023-08-01 ENCOUNTER — Other Ambulatory Visit: Payer: Self-pay

## 2023-08-01 MED ORDER — MOUNJARO 7.5 MG/0.5ML ~~LOC~~ SOAJ: 7.5000 mg | SUBCUTANEOUS | 0 refills | Status: DC

## 2023-08-02 MED ORDER — ALPRAZOLAM 0.5 MG PO TABS: 0.2500 mg | ORAL_TABLET | Freq: Two times a day (BID) | ORAL | 2 refills | Status: AC | PRN

## 2023-08-02 NOTE — Progress Notes (Signed)
   CHIEF COMPLAINT  UA/ only visit fot UTI     REASON FOR VISIT  Possible UTI, UA Visit Only      ASSESSMENT & PLAN Diagnoses and all orders for this visit:  Recurrent UTI -     POCT Urinalysis Dipstick (18997) -     Urine Culture     Patient notified.  Total time spent: 5 minutes  ALAN CHRISTELLA ARRANT, FNP 07/31/2023

## 2023-08-04 ENCOUNTER — Other Ambulatory Visit: Payer: Self-pay | Admitting: Family

## 2023-08-04 ENCOUNTER — Telehealth: Payer: Self-pay | Admitting: *Deleted

## 2023-08-04 LAB — URINE CULTURE

## 2023-08-04 MED ORDER — CIPROFLOXACIN HCL 500 MG PO TABS: 500.0000 mg | ORAL_TABLET | Freq: Two times a day (BID) | ORAL | 0 refills | Status: AC

## 2023-08-04 NOTE — Telephone Encounter (Signed)
 She has UTI and getting a cipro . She says that there was another strand in the cultureand now it needs to be Cipro .  And the patient wants to make sure that is not going to do anything with the Lynparza  and any or all of other meds she is  with the new med.  She wants someone to give her a call to make sure everything is good about no problems with any meds that you are already on and with adding the pressure to make sure there is no problems with any of her meds

## 2023-08-05 NOTE — Telephone Encounter (Signed)
 Spoke to pt for clarification. She states she will start Cipro  for UTI. She would like to know if it is to take since she is on Lynparza .

## 2023-08-06 ENCOUNTER — Encounter: Payer: Self-pay | Admitting: Oncology

## 2023-08-07 ENCOUNTER — Encounter: Payer: Self-pay | Admitting: Oncology

## 2023-08-10 ENCOUNTER — Ambulatory Visit
Admission: RE | Admit: 2023-08-10 | Discharge: 2023-08-10 | Disposition: A | Discharge status: Home / Self Care | Payer: Self-pay | Source: Ambulatory Visit | Attending: Oncology | Admitting: Oncology

## 2023-08-10 MED ORDER — GADOPICLENOL 0.5 MMOL/ML IV SOLN: 10.0000 mL | Freq: Once | INTRAVENOUS | Status: DC | PRN

## 2023-08-10 MED ORDER — GADOPICLENOL 0.5 MMOL/ML IV SOLN
10.0000 mL | Freq: Once | INTRAVENOUS | Status: AC | PRN
  Administered 2023-08-10: 10 mL via INTRAVENOUS

## 2023-08-25 NOTE — Assessment & Plan Note (Deleted)
 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 Post treatment CT showed excellent response. S/p Debulking R1 on 02/20/2023, s/p 2 cycles of adjuvant carboplatin  taxol  Repeat CT results were reviewed with patient- minimal residual peritoneal thickening.  Discussed case with Gynonc Dr. Mancil, recommend additional 2 cycles followed by Olaparib  maintenance.  Labs are reviewed and discussed with patient. She did not tolerate Olaparib  300mg  BID due to severe diarrhea.   Tolerates dose reduced olaparib  150 mg twice daily better. Continue   Aspirin  81mg  daily for thrombosis prophylaxis. .  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

## 2023-08-26 ENCOUNTER — Inpatient Hospital Stay

## 2023-08-26 ENCOUNTER — Inpatient Hospital Stay: Admitting: Oncology

## 2023-08-26 DIAGNOSIS — C569 Malignant neoplasm of unspecified ovary: Secondary | ICD-10-CM

## 2023-08-27 ENCOUNTER — Inpatient Hospital Stay: Attending: Oncology

## 2023-08-27 ENCOUNTER — Inpatient Hospital Stay (HOSPITAL_BASED_OUTPATIENT_CLINIC_OR_DEPARTMENT_OTHER): Admitting: Oncology

## 2023-08-27 ENCOUNTER — Encounter: Payer: Self-pay | Admitting: Oncology

## 2023-08-27 VITALS — BP 140/78 | HR 83 | Temp 97.8°F | Resp 18 | Ht 69.0 in | Wt 276.0 lb

## 2023-08-27 DIAGNOSIS — M542 Cervicalgia: Secondary | ICD-10-CM

## 2023-08-27 DIAGNOSIS — C482 Malignant neoplasm of peritoneum, unspecified: Secondary | ICD-10-CM

## 2023-08-27 DIAGNOSIS — Z1509 Genetic susceptibility to other malignant neoplasm: Secondary | ICD-10-CM | POA: Insufficient documentation

## 2023-08-27 DIAGNOSIS — Z1502 Genetic susceptibility to malignant neoplasm of ovary: Secondary | ICD-10-CM | POA: Insufficient documentation

## 2023-08-27 DIAGNOSIS — C569 Malignant neoplasm of unspecified ovary: Secondary | ICD-10-CM

## 2023-08-27 DIAGNOSIS — R110 Nausea: Secondary | ICD-10-CM | POA: Diagnosis not present

## 2023-08-27 DIAGNOSIS — C786 Secondary malignant neoplasm of retroperitoneum and peritoneum: Secondary | ICD-10-CM | POA: Diagnosis not present

## 2023-08-27 DIAGNOSIS — C563 Malignant neoplasm of bilateral ovaries: Secondary | ICD-10-CM | POA: Insufficient documentation

## 2023-08-27 DIAGNOSIS — K521 Toxic gastroenteritis and colitis: Secondary | ICD-10-CM | POA: Diagnosis not present

## 2023-08-27 DIAGNOSIS — M47812 Spondylosis without myelopathy or radiculopathy, cervical region: Secondary | ICD-10-CM | POA: Diagnosis not present

## 2023-08-27 DIAGNOSIS — T451X5A Adverse effect of antineoplastic and immunosuppressive drugs, initial encounter: Secondary | ICD-10-CM

## 2023-08-27 LAB — CBC WITH DIFFERENTIAL (CANCER CENTER ONLY)
Abs Immature Granulocytes: 0.06 K/uL (ref 0.00–0.07)
Basophils Absolute: 0.1 K/uL (ref 0.0–0.1)
Basophils Relative: 1 %
Eosinophils Absolute: 0.4 K/uL (ref 0.0–0.5)
Eosinophils Relative: 4 %
HCT: 36.3 % (ref 36.0–46.0)
Hemoglobin: 11.9 g/dL — ABNORMAL LOW (ref 12.0–15.0)
Immature Granulocytes: 1 %
Lymphocytes Relative: 28 %
Lymphs Abs: 2.9 K/uL (ref 0.7–4.0)
MCH: 33 pg (ref 26.0–34.0)
MCHC: 32.8 g/dL (ref 30.0–36.0)
MCV: 100.6 fL — ABNORMAL HIGH (ref 80.0–100.0)
Monocytes Absolute: 0.7 K/uL (ref 0.1–1.0)
Monocytes Relative: 7 %
Neutro Abs: 6.1 K/uL (ref 1.7–7.7)
Neutrophils Relative %: 59 %
Platelet Count: 224 K/uL (ref 150–400)
RBC: 3.61 MIL/uL — ABNORMAL LOW (ref 3.87–5.11)
RDW: 12.6 % (ref 11.5–15.5)
WBC Count: 10.1 K/uL (ref 4.0–10.5)
nRBC: 0 % (ref 0.0–0.2)

## 2023-08-27 LAB — CMP (CANCER CENTER ONLY)
ALT: 22 U/L (ref 0–44)
AST: 26 U/L (ref 15–41)
Albumin: 3.6 g/dL (ref 3.5–5.0)
Alkaline Phosphatase: 45 U/L (ref 38–126)
Anion gap: 10 (ref 5–15)
BUN: 27 mg/dL — ABNORMAL HIGH (ref 6–20)
CO2: 23 mmol/L (ref 22–32)
Calcium: 9.1 mg/dL (ref 8.9–10.3)
Chloride: 102 mmol/L (ref 98–111)
Creatinine: 1.15 mg/dL — ABNORMAL HIGH (ref 0.44–1.00)
GFR, Estimated: 55 mL/min — ABNORMAL LOW (ref >60)
Glucose, Bld: 172 mg/dL — ABNORMAL HIGH (ref 70–99)
Potassium: 4.6 mmol/L (ref 3.5–5.1)
Sodium: 135 mmol/L (ref 135–145)
Total Bilirubin: 0.4 mg/dL (ref 0.0–1.2)
Total Protein: 7.1 g/dL (ref 6.5–8.1)

## 2023-08-27 LAB — MAGNESIUM: Magnesium: 1.9 mg/dL (ref 1.7–2.4)

## 2023-08-27 MED ORDER — OLAPARIB 100 MG PO TABS
200.0000 mg | ORAL_TABLET | Freq: Two times a day (BID) | ORAL | 0 refills | Status: DC
  Filled 2023-08-29: qty 120, 30d supply, fill #0

## 2023-08-27 NOTE — Assessment & Plan Note (Addendum)
 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 Post treatment CT showed excellent response. S/p Debulking R1 on 02/20/2023, s/p 2 cycles of adjuvant carboplatin  taxol  Repeat CT results were reviewed with patient- minimal residual peritoneal thickening.  Discussed case with Gynonc Dr. Mancil, recommend additional 2 cycles followed by Olaparib  maintenance.  Labs are reviewed and discussed with patient. She did not tolerate Olaparib  300mg  BID due to severe diarrhea.   She tolerated 150 mg twice daily. I recommend to go up to 200 mg daily to see if she tolerates.  Aspirin  81mg  daily for thrombosis prophylaxis. .  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

## 2023-08-27 NOTE — Progress Notes (Signed)
 Hematology/Oncology Progress note [phone] [phone]      CHIEF COMPLAINTS  High grade serous carcinoma of ovaries.   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 Post treatment CT showed excellent response. S/p Debulking R1 on 02/20/2023, s/p 2 cycles of adjuvant carboplatin  taxol  Repeat CT results were reviewed with patient- minimal residual peritoneal thickening.  Discussed case with Gynonc Dr. Mancil, recommend additional 2 cycles followed by Olaparib  maintenance.  Labs are reviewed and discussed with patient. She did not tolerate Olaparib  300mg  BID due to severe diarrhea.   She tolerated 150 mg twice daily. I recommend to go up to 200 mg daily to see if she tolerates.  Aspirin  81mg  daily for thrombosis prophylaxis. .  Genetic evaluation - BRIP1 mutation  Chemotherapy induced diarrhea Recommend Imodium as needed.- she may use medication prophylactically.   Chemotherapy-induced nausea Continue home antiemetics PRN.    Neck pain Neck pain with right upper extremity numbness MRI findings were reviewed and discussed with patient.  Patient has severe degenerative disease.  Refer to neurosurgeon   Follow up 4 weeks   All questions were answered. The patient knows to call the clinic with any problems, questions or concerns.  Zelphia Cap, MD, PhD [facility] Hematology Oncology 08/27/2023    HISTORY OF PRESENTING ILLNESS:  Nancy [name] 58 y.o. female presents to establish care for  peritoneal carcinomatosis I have reviewed her chart and materials related to her cancer extensively and collaborated history with the patient. 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 Aortic atherosclerosis    11/06/2022 Initial Diagnosis   Primary peritoneal carcinomatosis   -presented to emergency room for evaluation of abdominal pain/bloating, poor oral intake, nonbloody nonbilious vomiting  Patient underwent paracentesis, cytology showed malignant cells. IHC can not be added.    11/14/2022 Imaging   CT chest w contrast  1. Prominent subcentimeter right retrocrural lymph nodes, metastatic disease can not be excluded. 2. Nonspecific ground-glass nodule of the superior portion of the right lower lobe measuring 7 mm. Recommend attention on follow-up. 3. Small left pleural effusion. 4. Partially visualized large volume abdominal ascites and peritoneal thickening, volume of ascites appears increased when compared with the prior CT. 5. Coronary artery calcifications and aortic Atherosclerosis (ICD10-I70.0).   11/18/2022 Procedure   Medi port placement by IR   11/18/2022 Procedure   Omentum biopsy showed  1. Omentum, biopsy,  :   - INVOLVEMENT BY HIGH-GRADE SEROUS CARCINOMA OF GYNECOLOGIC ORIGIN.   Diagnosis Note : The carcinoma is positive for cytokeratin 7, PAX8, WT1, and ER.  Cytokeratin 20 and GATA3 are negative.  P53 is overexpressed.  The morphologic findings in conjunction with the pattern of immunohistochemistry support the  above diagnosis.   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 The report date is 11/25/2022.  The CancerNext-Expanded + RNAinsight gene panel offered by [employer]  and includes sequencing and rearrangement analysis for the following 71  genes: AIP, ALK, APC*, ATM*, AXIN2, BAP1, BARD1, BMPR1A, BRCA1*, BRCA2*, BRIP1*, CDC73, CDH1*,CDK4, CDKN1B, CDKN2A, CHEK2*, CTNNA1, DICER1, FH, FLCN, KIF1B, LZTR1, MAX, MEN1, MET, MLH1*, MSH2*, MSH3, MSH6*, MUTYH*, NF1*, NF2, NTHL1, PALB2*, PHOX2B, PMS2*, POT1, PRKAR1A, PTCH1, PTEN*, RAD51C*, RAD51D*,RB1, RET, SDHA, SDHAF2, SDHB, SDHC, SDHD, SMAD4, SMARCA4, SMARCB1, SMARCE1, STK11, SUFU, TMEM127, TP53*,TSC1, TSC2, VHL; EGFR, EGLN1, HOXB13, KIT, MITF, PDGFRA, POLD1 and POLE (sequencing only); EPCAM and GREM1 (deletion/duplication only).    01/13/2023 Imaging   CT chest abdomen pelvis w contrast showed  Decreasing ascites, trace residual. The areas of omental caking and nodularity along the perineum is decreasing as well with significant residual particularly in the upper anterior abdomen.   Stable small mesenteric and retroperitoneal nodes. Resolved left pleural effusion. No developing new mass lesion. Small left renal angiomyolipoma and upper pole nonobstructing renal stone.   02/20/2023 Surgery    Interval debulking R1 - robotic assisted laparoscopic total hysterectomy, bilateral salpingo-oophorectomy, lysis of adhesions, peritoneal stripping, and omentectomy with stage IIIC high grade serous carcinoma of bilateral ovaries   A. Uterus, bilateral fallopian tubes, and bilateral ovaries, total hysterectomy and bilateral salpingo-oophorectomy: Residual high grade serous carcinoma (1.6 cm) involving bilateral ovaries, left fallopian tube, and uterine serosa.   Additional findings:  Uterus with leiomyomata (up to 1.1 cm) Right fallopian tube with no pathologic diagnosis See synoptic report.   B. Peritoneal nodules, excision: High grade serous carcinoma (up to 0.1 cm).  C. Omentum, omentectomy:  High grade serous carcinoma (0.7 cm). Moderate treatment response (CRS2).     03/04/2023 Cancer Staging   Staging form: Ovary, AJCC 7th Edition - Pathologic stage from 03/04/2023: FIGO Stage IIIC  (cM0) - Signed by Babara Call, MD on 03/13/2023 Stage prefix: Initial diagnosis Laterality: Bilateral   History of sepsis secondary to diabetic foot infection/osteomyelitis status post left BKA done on 09/24/2022,    Today she is accompanied by significant other.  Patient is able to resume Olarparib at 150mg  BID dosage.  Still has diarrhea, usually a few hours after night time dosage. She uses imodium PRN.  She is coping with the side effects. Manageable. No nausea vomiting.    MEDICAL HISTORY:  Past Medical History:  Diagnosis Date   Abnormal cardiovascular stress test 09/21/2018   Formatting of this note might be different from the original. Lexiscan Myoview 09/16/2018 revealed mild anterior ischemia   Adverse effect of motion 05/10/2014   AKI (acute kidney injury) (HCC) 10/22/2022   Anxiety    Arthritis    r knee   Asthma    Breast cyst 05/10/2014   CAD (coronary artery disease)    a.) LHC 06/04/2009 at [facility]; non-obstructive CAD. b.) CTA with FFR 10/08/2018: extensive mixed plaque proximal to mid LAD (51-69%); Coronary Ca score 217; FFR 0.71 dPDA, 0.86 mLAD, 0.87 dLCx.   CCF (congestive cardiac failure) (HCC) 06/06/2008   a.) 30% EF. b.) TTE 06/03/2011: EF >55%; triv MR. c.) TTE 04/27/2018: EF 55%; mild LVH; triv PR, mild MR/TR; G1DD.   Cellulitis of left lower extremity 09/18/2022   Chest pain with high risk for cardiac etiology 07/02/2016   Chronic use of opiate drug for therapeutic purpose 02/13/2022   Formatting of this note might be different from the original. [facility] Contract signed on 04/18/16 & updated 03/04/17; UDS done on 04/18/16.   Complication of anesthesia    Diabetic foot infection (HCC) 03/13/2015   Diabetic foot ulcer (HCC) 02/08/2021  Diabetic ulcer of left heel associated with type 2 diabetes mellitus (HCC) 02/08/2021   Eczema    Family history of adverse reaction to anesthesia    a.) PONV in mother and grandmother   Gas gangrene (HCC) 09/18/2022   GERD  (gastroesophageal reflux disease) 05/10/2014   History of kidney stones    History of MSSA bacteremia due to osteomyelitis L foot 09/18/22 10/22/2022   HLD (hyperlipidemia)    Hyponatremia 03/13/2015   Migraines    Motion sickness    MSSA bacteremia 09/20/2022   Osteomyelitis (HCC) 09/18/2022   Other acute osteomyelitis, left ankle and foot (HCC) 02/13/2022   Panic attacks    Peripheral edema 04/27/2018   Pneumonia    PONV (postoperative nausea and vomiting)    Sepsis secondary to diabetic foot infection 03/13/2015   Sepsis secondary to diabetic foot infection 03/13/2015   T2DM (type 2 diabetes mellitus) (HCC)    Takotsubo cardiomyopathy / transient apical balooning syndrome / stress-induced cardiomyopathy 05/10/2014   Unspecified essential hypertension     SURGICAL HISTORY: Past Surgical History:  Procedure Laterality Date   AMPUTATION Left 09/19/2022   Procedure: AMPUTATION BELOW KNEE;  Surgeon: Jama Cordella MATSU, MD;  Location: [facility];  Service: Vascular;  Laterality: Left;   AMPUTATION Left 09/24/2022   Procedure: AMPUTATION BELOW KNEE WITH WOUND CLOSURE;  Surgeon: Jama Cordella MATSU, MD;  Location: [facility];  Service: Vascular;  Laterality: Left;   AMPUTATION TOE Left 03/16/2015   Procedure: left fifth toe amputation with incision and drainage;  Surgeon: Eva Gay, DPM;  Location: [facility];  Service: Podiatry;  Laterality: Left;   APPLICATION OF WOUND VAC Left 02/14/2021   Procedure: APPLICATION OF WOUND VAC;  Surgeon: Gay Eva, DPM;  Location: [facility];  Service: Podiatry;  Laterality: Left;   APPLICATION OF WOUND VAC Left 04/16/2021   Procedure: APPLICATION OF WOUND VAC;  Surgeon: Lowery Estefana RAMAN, DO;  Location: [facility];  Service: Plastics;  Laterality: Left;   CARDIAC CATHETERIZATION Left 06/04/2009   Procedure: CARDIAC CATHETERIZATION; Location: [facility]   CHOLECYSTECTOMY     DEBRIDEMENT AND CLOSURE WOUND Left 04/16/2021    Procedure: DEBRIDEMENT AND CLOSURE WOUND;  Surgeon: Lowery Estefana RAMAN, DO;  Location: [facility];  Service: Plastics;  Laterality: Left;  1 hour   IR IMAGING GUIDED PORT INSERTION  11/18/2022   IRRIGATION AND DEBRIDEMENT FOOT Left 02/08/2021   Procedure: IRRIGATION AND DEBRIDEMENT FOOT - LFT HEEL ULCER;  Surgeon: Gay Eva, DPM;  Location: [facility];  Service: Podiatry;  Laterality: Left;   IRRIGATION AND DEBRIDEMENT FOOT Left 02/14/2021   Procedure: IRRIGATION AND DEBRIDEMENT LEFT HEEL;  Surgeon: Gay Eva, DPM;  Location: [facility];  Service: Podiatry;  Laterality: Left;   kidney stone removal     KNEE ARTHROSCOPY W/ MENISCAL REPAIR     NOSE SURGERY  01/07/1989   due to fracture   TEE WITHOUT CARDIOVERSION N/A 09/26/2022   Procedure: TRANSESOPHAGEAL ECHOCARDIOGRAM;  Surgeon: Dewane Shiner, DO;  Location: [facility];  Service: Cardiovascular;  Laterality: N/A;   TOE AMPUTATION     second toe   TONSILLECTOMY     TOTAL KNEE ARTHROPLASTY  01/11/2011   Procedure: TOTAL KNEE ARTHROPLASTY;  Surgeon: Norleen LITTIE Gavel;  Location: [facility];  Service: Orthopedics;  Laterality: Right;  COMPUTER ASSISTED TOTAL KNEE REPLACEMENT    SOCIAL HISTORY: Social History   Socioeconomic History   Marital status: Married    Spouse name: Not on file  Number of children: Not on file   Years of education: Not on file   Highest education level: Not on file  Occupational History   Not on file  Tobacco Use   Smoking status: Former    Current packs/day: 0.00    Types: Cigarettes    Quit date: 03/12/1985    Years since quitting: 38.4   Smokeless tobacco: Never  Vaping Use   Vaping status: Never Used  Substance and Sexual Activity   Alcohol use: No   Drug use: No   Sexual activity: Yes    Birth control/protection: Post-menopausal  Other Topics Concern   Not on file  Social History Narrative   Wife, Barnie, at bedside. One indoor pet, dog.   Social Drivers of Research scientist (physical sciences) Strain: Not on file  Food Insecurity: No Food Insecurity (10/22/2022)   Hunger Vital Sign    Worried About Running Out of Food in the Last Year: Never true    Ran Out of Food in the Last Year: Never true  Transportation Needs: No Transportation Needs (10/22/2022)   PRAPARE - Administrator, Civil Service (Medical): No    Lack of Transportation (Non-Medical): No  Physical Activity: Not on file  Stress: Not on file  Social Connections: Not on file  Intimate Partner Violence: Not At Risk (10/22/2022)   Humiliation, Afraid, Rape, and Kick questionnaire    Fear of Current or Ex-Partner: No    Emotionally Abused: No    Physically Abused: No    Sexually Abused: No    FAMILY HISTORY: Family History  Problem Relation Age of Onset   Transient ischemic attack Mother    Asthma Mother    Hypertension Mother    Lung cancer Father 38   Hypertension Father    Asthma Sister    Anxiety disorder Sister    Asthma Sister    Hypertension Brother    Ovarian cancer Maternal Aunt 34   Melanoma Maternal Uncle        dx 27s   Lung cancer Paternal Aunt 63   Stomach cancer Maternal Grandmother    Colon cancer Maternal Grandmother    Lymphoma Maternal Grandmother    Melanoma Maternal Grandfather 72       sun exposure   Lung cancer Paternal Grandmother 51   Kidney cancer Paternal Grandfather        dx early   Crohn's disease Other    GER disease Other    Breast cancer Neg Hx     ALLERGIES:  is allergic to shellfish allergy, codeine, morphine  and codeine, rosuvastatin, percocet [oxycodone -acetaminophen ], sulfa antibiotics, and theophyllines.  MEDICATIONS:  Current Outpatient Medications  Medication Sig Dispense Refill   olaparib  (LYNPARZA ) 100 MG tablet Take 2 tablets (200 mg total) by mouth 2 (two) times daily. Swallow whole. May take with food to decrease nausea and vomiting. 120 tablet 0   acidophilus (RISAQUAD) CAPS capsule Take 1 capsule by mouth daily.     albuterol   (PROVENTIL  HFA;VENTOLIN  HFA) 108 (90 Base) MCG/ACT inhaler Inhale 2 puffs into the lungs every 4 (four) hours as needed for wheezing or shortness of breath. Reported on 03/29/2015 1 Inhaler 3   albuterol  (PROVENTIL ) (2.5 MG/3ML) 0.083% nebulizer solution Take 2.5 mg by nebulization as needed.     ALPRAZolam  (XANAX ) 0.5 MG tablet Take 1 tablet (0.5 mg total) by mouth See admin instructions. Take 1 tablet 30 minutes prior to imaging procedure, may take another tablet if needed. 2  tablet 0   ALPRAZolam  (XANAX ) 0.5 MG tablet Take 0.5-1 tablets (0.25-0.5 mg total) by mouth 2 (two) times daily as needed for anxiety. Take 1/2 to 1 tablet by mouth as needed for panic attacks 60 tablet 2   ascorbic acid  (VITAMIN C ) 500 MG tablet Take 1 tablet (500 mg total) by mouth 2 (two) times daily. 60 tablet 0   aspirin  EC 81 MG tablet Take 81 mg by mouth daily.     carvedilol  (COREG ) 6.25 MG tablet TAKE 1 TABLET BY MOUTH TWICE DAILY 60 tablet 02   cholecalciferol (VITAMIN D3) 25 MCG (1000 UNIT) tablet Take 1,000 Units by mouth daily.     Continuous Glucose Sensor (FREESTYLE LIBRE 2 SENSOR) MISC 1 each by Does not apply route every 14 (fourteen) days. 2 each 3   Cyanocobalamin  (VITAMIN B 12) 500 MCG TABS Take by mouth daily at 8 pm.     desvenlafaxine (PRISTIQ) 100 MG 24 hr tablet TAKE 1 TABLET BY MOUTH EVERY DAY 90 tablet 2   dexamethasone  (DECADRON ) 4 MG tablet Take 2 tablets (8mg ) by mouth daily starting the day after carboplatin  for 2 days. Take with food 30 tablet 1   dicyclomine  (BENTYL ) 10 MG capsule Take 1 capsule (10 mg total) by mouth 4 (four) times daily -  before meals and at bedtime. 60 capsule 1   diphenoxylate -atropine  (LOMOTIL ) 2.5-0.025 MG tablet Take 1 tablet by mouth 4 (four) times daily as needed for diarrhea or loose stools. 30 tablet 0   Ensure Max Protein (ENSURE MAX PROTEIN) LIQD Take 330 mLs (11 oz total) by mouth 2 (two) times daily.     fenofibrate  54 MG tablet TAKE 1 TABLET BY MOUTH EVERY DAY 30  tablet 11   gabapentin  (NEURONTIN ) 300 MG capsule TAKE 1 CAPSULE (300MG  TOTAL) BY MOUTH AT BEDTIME AS NEEDED 30 capsule 3   HYDROmorphone  (DILAUDID ) 2 MG tablet Take 1 tablet (2 mg total) by mouth every 4 (four) hours as needed for severe pain (pain score 7-10). 60 tablet 0   ibuprofen (ADVIL) 800 MG tablet TAKE 1 TABLET BY MOUTH EVERY 8 HOURS AS NEEDED 90 tablet 3   insulin  NPH-regular Human (70-30) 100 UNIT/ML injection Inject 15 Units into the skin 2 (two) times daily with a meal. 10 mL 11   iron  polysaccharides (NIFEREX) 150 MG capsule Take 1 capsule (150 mg total) by mouth daily. 30 capsule 2   lidocaine -prilocaine  (EMLA ) cream Apply to affected area once 30 g 3   lisinopril  (ZESTRIL ) 10 MG tablet Take 1 tablet (10 mg total) by mouth every morning AND 0.5 tablets (5 mg total) at bedtime. 45 tablet 11   lisinopril  (ZESTRIL ) 5 MG tablet Take 1 tablet (5 mg total) by mouth 2 (two) times daily. 180 tablet 1   metFORMIN  (GLUCOPHAGE ) 500 MG tablet TAKE TWO TABLETS EACH MORNING. AND 3 TABLETS IN THE EVENING AS DIRECTED 150 tablet 5   methocarbamol  (ROBAXIN ) 500 MG tablet Take 1 tablet (500 mg total) by mouth every 8 (eight) hours as needed for muscle spasms. 30 tablet 0   mirtazapine  (REMERON ) 7.5 MG tablet TAKE 1 TABLET BY MOUTH AT BEDTIME. 90 tablet 2   montelukast  (SINGULAIR ) 10 MG tablet TAKE 1 TABLET BY MOUTH EVERYDAY AT BEDTIME 30 tablet 5   Multiple Vitamin (MULTIVITAMIN WITH MINERALS) TABS tablet Take 1 tablet by mouth daily. 30 tablet 2   naloxone  (NARCAN ) nasal spray 4 mg/0.1 mL SPRAY 1 SPRAY INTO ONE NOSTRIL AS DIRECTED FOR OPIOID OVERDOSE (  TURN PERSON ON SIDE AFTER DOSE. IF NO RESPONSE IN 2-3 MINUTES OR PERSON RESPONDS BUT RELAPSES, REPEAT USING A NEW SPRAY DEVICE AND SPRAY INTO THE OTHER NOSTRIL. CALL 911 AFTER USE.) * EMERGENCY USE ONLY * 1 each 0   nystatin  (MYCOSTATIN /NYSTOP ) powder APPLY TOPICALLY TWICE DAILY AS NEEDED 30 g 3   nystatin  ointment (MYCOSTATIN ) Apply 1 Application  topically 2 (two) times daily. 30 g 0   ondansetron  (ZOFRAN ) 8 MG tablet Take 1 tablet (8 mg total) by mouth every 8 (eight) hours as needed for nausea or vomiting. Start on the third day after carboplatin . 30 tablet 1   ondansetron  (ZOFRAN -ODT) 8 MG disintegrating tablet Take 1 tablet (8 mg total) by mouth every 8 (eight) hours as needed for nausea or vomiting. 90 tablet 3   pantoprazole  (PROTONIX ) 40 MG tablet TAKE 1 TABLET BY MOUTH EVERY DAY 90 tablet 3   pravastatin  (PRAVACHOL ) 40 MG tablet TAKE 1 TABLET BY MOUTH EVERY DAY 30 tablet 5   prochlorperazine  (COMPAZINE ) 10 MG tablet Take 1 tablet (10 mg total) by mouth every 6 (six) hours as needed for nausea or vomiting. 90 tablet 1   tirzepatide  (MOUNJARO ) 7.5 MG/0.5ML Pen Inject 7.5 mg into the skin once a week. 2 mL 0   UBRELVY  100 MG TABS TAKE 1 TABLET (100 MG TOTAL) BY MOUTH AS NEEDED (MIGRAINE). 12 tablet 1   zinc  sulfate 220 (50 Zn) MG capsule Take 1 capsule (220 mg total) by mouth daily. 30 capsule 0   No current facility-administered medications for this visit.    Review of Systems  Constitutional:  Negative for appetite change, chills, fatigue, fever and unexpected weight change.  HENT:   Negative for hearing loss and voice change.   Eyes:  Negative for eye problems.  Respiratory:  Negative for chest tightness, cough and shortness of breath.   Cardiovascular:  Negative for chest pain and leg swelling.  Gastrointestinal:  Positive for diarrhea. Negative for abdominal distention, abdominal pain and blood in stool.  Endocrine: Negative for hot flashes.  Genitourinary:  Negative for difficulty urinating and frequency.   Musculoskeletal:  Negative for arthralgias.       S/p left  BKA   Skin:  Negative for itching and rash.  Neurological:  Positive for numbness. Negative for extremity weakness.  Hematological:  Negative for adenopathy.  Psychiatric/Behavioral:  Negative for confusion.      PHYSICAL EXAMINATION: ECOG PERFORMANCE  STATUS: 1 - Symptomatic but completely ambulatory  Vitals:   08/27/23 1355  BP: (!) 140/78  Pulse: 83  Resp: 18  Temp: 97.8 F (36.6 C)  SpO2: 97%   Filed Weights   08/27/23 1355  Weight: 276 lb (125.2 kg)    Physical Exam Constitutional:      General: She is not in acute distress.    Appearance: She is obese. She is not diaphoretic.  HENT:     Head: Normocephalic and atraumatic.     Mouth/Throat:     Pharynx: No oropharyngeal exudate.  Eyes:     General: No scleral icterus. Cardiovascular:     Rate and Rhythm: Normal rate and regular rhythm.  Pulmonary:     Effort: Pulmonary effort is normal. No respiratory distress.     Breath sounds: Normal breath sounds. No wheezing.  Abdominal:     General: There is no distension.     Palpations: Abdomen is soft.     Tenderness: There is no abdominal tenderness.  Musculoskeletal:  General: Normal range of motion.     Cervical back: Normal range of motion and neck supple.     Comments: S/p left BKA  Skin:    General: Skin is warm and dry.     Findings: No erythema.  Neurological:     Mental Status: She is alert and oriented to person, place, and time. Mental status is at baseline.     Cranial Nerves: No cranial nerve deficit.     Motor: No abnormal muscle tone.  Psychiatric:        Mood and Affect: Affect normal.      LABORATORY DATA:  I have reviewed the data as listed    Latest Ref Rng & Units 08/27/2023    1:31 PM 07/23/2023    9:18 AM 06/27/2023    9:31 AM  CBC  WBC 4.0 - 10.5 K/uL 10.1  7.5  8.4   Hemoglobin 12.0 - 15.0 g/dL 88.0  89.3  89.6   Hematocrit 36.0 - 46.0 % 36.3  32.4  31.7   Platelets 150 - 400 K/uL 224  216  235       Latest Ref Rng & Units 08/27/2023    1:30 PM 07/23/2023    9:18 AM 06/27/2023    9:31 AM  CMP  Glucose 70 - 99 mg/dL 827  789  743   BUN 6 - 20 mg/dL 27  31  29    Creatinine 0.44 - 1.00 mg/dL 8.84  9.01  9.02   Sodium 135 - 145 mmol/L 135  136  134   Potassium 3.5 - 5.1  mmol/L 4.6  4.3  4.6   Chloride 98 - 111 mmol/L 102  105  102   CO2 22 - 32 mmol/L 23  23  23    Calcium 8.9 - 10.3 mg/dL 9.1  9.0  8.9   Total Protein 6.5 - 8.1 g/dL 7.1  6.9  7.0   Total Bilirubin 0.0 - 1.2 mg/dL 0.4  0.4  0.6   Alkaline Phos 38 - 126 U/L 45  48  53   AST 15 - 41 U/L 26  25  26    ALT 0 - 44 U/L 22  31  28       RADIOGRAPHIC STUDIES: I have personally reviewed the radiological images as listed and agreed with the findings in the report. MR Cervical Spine W Wo Contrast Result Date: 08/16/2023 CLINICAL DATA:  Initial evaluation for neck pain for several months. History of ovarian cancer. EXAM: MRI CERVICAL SPINE WITHOUT AND WITH CONTRAST TECHNIQUE: Multiplanar and multiecho pulse sequences of the cervical spine, to include the craniocervical junction and cervicothoracic junction, were obtained without and with intravenous contrast. CONTRAST:  10 mL of Vueway  COMPARISON:  None available. FINDINGS: Alignment: Mild levoscoliosis. Mild reversal of the normal cervical lordosis. Trace facet mediated anterolisthesis of C2 on C3. Vertebrae: Vertebral body height maintained without acute or chronic fracture. Bone marrow signal intensity within normal limits. No discrete or worrisome osseous lesions. Reactive marrow edema and enhancement present about the left C2-3 facet due to facet arthritis (series 104, image 13). No other abnormal marrow edema or enhancement. Cord: Normal signal and morphology.  No abnormal enhancement. Posterior Fossa, vertebral arteries, paraspinal tissues: Visualized brain and posterior fossa within normal limits. Craniocervical junction normal. Paraspinous and prevertebral soft tissues within normal limits. Normal intravascular flow voids seen within the vertebral arteries bilaterally. Disc levels: C2-C3: Negative interspace. Moderate left facet hypertrophy with associated reactive marrow edema and trace joint  effusion. No spinal stenosis. Foramina remain patent. C3-C4:  Minimal disc bulge with right-sided uncovertebral spurring. No spinal stenosis. Foramina remain adequately patent. C4-C5: Mild disc bulge with uncovertebral spurring, asymmetric to the right. Flattening of the ventral thecal sac without significant spinal stenosis. Mild right C5 foraminal narrowing. Left neural foramina remains patent. C5-C6: Mild disc bulge with uncovertebral spurring. No significant spinal stenosis. Mild right C6 foraminal narrowing. Left neural foramina remains patent. C6-C7: Broad-based right eccentric disc bulge flattens and partially effaces the ventral thecal sac. Mild spinal stenosis. Superimposed uncovertebral spurring with moderate right and mild left C7 foraminal narrowing. C7-T1:  Negative interspace.  No canal or foraminal stenosis. Visualized upper thoracic spine demonstrates minor noncompressive disc bulging at T1-2 and T2-3 without significant stenosis. IMPRESSION: 1. Moderate left-sided facet arthritis at C2-3 with associated reactive marrow edema. Finding could serve as a source for neck pain and referred symptoms. 2. Right eccentric disc bulge with uncovertebral spurring at C6-7, resulting in mild spinal stenosis, with moderate right C7 foraminal narrowing. 3. Mild right C5 and C6 foraminal narrowing related to disc bulge and uncovertebral disease. Electronically Signed   By: Morene Hoard M.D.   On: 08/16/2023 03:18

## 2023-08-27 NOTE — Assessment & Plan Note (Signed)
 Neck pain with right upper extremity numbness MRI findings were reviewed and discussed with patient.  Patient has severe degenerative disease.  Refer to neurosurgeon

## 2023-08-27 NOTE — Progress Notes (Signed)
 Patient had a MRI on 08/10/2023, she is still having the neuropathy in her hands and it has only got worse. She is going to see a eye doctor next week due to her left eye being blurry making it hard for her to see good.

## 2023-08-27 NOTE — Assessment & Plan Note (Signed)
 Continue home antiemetics PRN.

## 2023-08-27 NOTE — Patient Instructions (Signed)

## 2023-08-27 NOTE — Assessment & Plan Note (Signed)
 Recommend Imodium as needed.- she may use medication prophylactically.

## 2023-08-28 LAB — CA 125: Cancer Antigen (CA) 125: 7.2 U/mL (ref 0.0–38.1)

## 2023-08-29 ENCOUNTER — Other Ambulatory Visit: Payer: Self-pay | Admitting: Pharmacy Technician

## 2023-08-29 ENCOUNTER — Other Ambulatory Visit: Payer: Self-pay

## 2023-08-29 ENCOUNTER — Telehealth: Payer: Self-pay | Admitting: Pharmacy Technician

## 2023-08-29 ENCOUNTER — Other Ambulatory Visit (HOSPITAL_COMMUNITY): Payer: Self-pay

## 2023-08-29 ENCOUNTER — Encounter: Payer: Self-pay | Admitting: Oncology

## 2023-08-29 NOTE — Progress Notes (Signed)
 Specialty Pharmacy Initial Fill Coordination Note  Nancy [name] is a 58 y.o. female contacted today regarding refills of specialty medication(s) Olaparib  (LYNPARZA ) .  Patient requested Delivery  on 09/02/23  to verified address [address] [place] [address]   Medication will be filled on 09/01/2023.   Patient is aware of $0 copayment.  Leisure centre manager.  Sarai January (Patty) Chet Burnet, CPhT  [facility], Zelda Salmon, [facility] Patient Advocate Specialist III Phone: [phone]  Fax: [phone]

## 2023-08-29 NOTE — Telephone Encounter (Signed)
 Patient successfully OnBoarded. Medication scheduled to be shipped on 08/25 for delivery on 08/26 from [facility] to patient's address. Patient also knows to call me at [phone] with any questions or concerns regarding receiving medication or if there is any unexpected change in co-pay.   Nancy [name] (Patty) Chet Burnet, CPhT  [facility], Zelda Salmon, [facility] Patient Advocate Specialist III Phone: [phone]  Fax: [phone]

## 2023-09-01 ENCOUNTER — Other Ambulatory Visit: Payer: Self-pay

## 2023-09-01 ENCOUNTER — Other Ambulatory Visit: Payer: Self-pay | Admitting: Family

## 2023-09-01 DIAGNOSIS — F411 Generalized anxiety disorder: Secondary | ICD-10-CM

## 2023-09-01 NOTE — Progress Notes (Signed)
 Specialty Pharmacy Initiation Note   Nancy [name] is a 58 y.o. female who will be followed by the specialty pharmacy service for RxSp Oncology    Review of administration, indication, effectiveness, safety, potential side effects, storage/disposable, and missed dose instructions occurred today for patient's specialty medication(s) No data recorded    Patient/Caregiver did not have any additional questions or concerns.   Patient's therapy is appropriate to: Initiate    Goals Addressed             This Visit's Progress    Slow Disease Progression       Patient is initiating therapy. Patient will maintain adherence         Dixon Luczak N Glyn Zendejas Specialty Pharmacist

## 2023-09-02 MED ORDER — DESVENLAFAXINE SUCCINATE ER 100 MG PO TB24: 100.0000 mg | ORAL_TABLET | Freq: Every day | ORAL | 0 refills | Status: DC

## 2023-09-04 ENCOUNTER — Other Ambulatory Visit: Payer: Self-pay | Admitting: Cardiology

## 2023-09-04 DIAGNOSIS — F411 Generalized anxiety disorder: Secondary | ICD-10-CM

## 2023-09-11 ENCOUNTER — Other Ambulatory Visit: Payer: Self-pay | Admitting: Family

## 2023-09-11 ENCOUNTER — Other Ambulatory Visit (HOSPITAL_COMMUNITY): Payer: Self-pay

## 2023-09-11 MED ORDER — INSULIN NPH ISOPHANE & REGULAR (70-30) 100 UNIT/ML ~~LOC~~ SUSP
70.0000 [IU] | Freq: Two times a day (BID) | SUBCUTANEOUS | 10 refills | Status: DC
  Filled 2023-09-11: qty 50, 36d supply, fill #0

## 2023-09-12 ENCOUNTER — Telehealth: Payer: Self-pay | Admitting: *Deleted

## 2023-09-12 ENCOUNTER — Other Ambulatory Visit (HOSPITAL_COMMUNITY): Payer: Self-pay

## 2023-09-12 ENCOUNTER — Other Ambulatory Visit: Payer: Self-pay

## 2023-09-12 MED ORDER — INSULIN NPH ISOPHANE & REGULAR (70-30) 100 UNIT/ML ~~LOC~~ SUSP: 70.0000 [IU] | Freq: Two times a day (BID) | SUBCUTANEOUS | 10 refills | Status: DC

## 2023-09-12 NOTE — Telephone Encounter (Signed)
 [employer] like insurance company sent the papers and all the other information was sent through medical records and it all went through today.  We did see that it went through the fax.

## 2023-09-13 ENCOUNTER — Other Ambulatory Visit (HOSPITAL_COMMUNITY): Payer: Self-pay

## 2023-09-17 ENCOUNTER — Other Ambulatory Visit: Payer: Self-pay | Admitting: Family

## 2023-09-18 ENCOUNTER — Other Ambulatory Visit: Payer: Self-pay

## 2023-09-18 NOTE — Progress Notes (Signed)
 Specialty Pharmacy Ongoing Clinical Assessment Note  Nancy [name] is a 58 y.o. female who is being followed by the specialty pharmacy service for RxSp Oncology   Patient's specialty medication(s) reviewed today: Olaparib  (LYNPARZA )   Missed doses in the last 4 weeks: 0   Patient/Caregiver did not have any additional questions or concerns.   Therapeutic benefit summary: Patient is achieving benefit   Adverse events/side effects summary: Experienced adverse events/side effects (diarrhea which she pretreats with Imodium and occaisional nausea whcih has not needed treatment (as Zofran  on hand, if needed))   Patient's therapy is appropriate to: Continue    Goals Addressed             This Visit's Progress    Slow Disease Progression   No change    Patient is initiating therapy. Patient will maintain adherence         Follow up: 3 months  Nancy [name] Specialty Pharmacist

## 2023-09-22 ENCOUNTER — Other Ambulatory Visit (HOSPITAL_COMMUNITY): Payer: Self-pay

## 2023-09-23 ENCOUNTER — Telehealth: Payer: Self-pay | Admitting: Family

## 2023-09-23 NOTE — Telephone Encounter (Signed)
 Patient left VM to check the status of her long-term disability paperwork. Please advise.

## 2023-09-24 ENCOUNTER — Other Ambulatory Visit: Payer: Self-pay | Admitting: Family

## 2023-09-24 ENCOUNTER — Encounter (INDEPENDENT_AMBULATORY_CARE_PROVIDER_SITE_OTHER): Payer: Self-pay

## 2023-09-24 DIAGNOSIS — E782 Mixed hyperlipidemia: Secondary | ICD-10-CM

## 2023-09-25 ENCOUNTER — Other Ambulatory Visit: Payer: Self-pay | Admitting: Pharmacy Technician

## 2023-09-25 ENCOUNTER — Other Ambulatory Visit: Payer: Self-pay | Admitting: Oncology

## 2023-09-25 ENCOUNTER — Other Ambulatory Visit: Payer: Self-pay

## 2023-09-25 MED ORDER — OLAPARIB 100 MG PO TABS
200.0000 mg | ORAL_TABLET | Freq: Two times a day (BID) | ORAL | 0 refills | Status: DC
  Filled 2023-09-25: qty 120, 30d supply, fill #0

## 2023-09-25 NOTE — Progress Notes (Signed)
 Specialty Pharmacy Refill Coordination Note  Nancy [name] is a 58 y.o. female contacted today regarding refills of specialty medication(s) Olaparib  (LYNPARZA )   Patient requested Delivery   Delivery date: 09/30/23   Verified address: [address] [place] [address]   Medication will be filled on 09/29/23. Answered questionnaire.  This fill date is pending response to refill request from provider. Sent Patient mychart  and if they have not received fill by intended date they must follow up with pharmacy.

## 2023-09-26 NOTE — Progress Notes (Signed)
 Referring Physician:  Orlean Alan HERO, FNP [address] [place],  [place] [address]  Primary Physician:  Orlean Alan HERO, FNP  History of Present Illness: 09/30/2023 Ms. Nancy [name] has a history of Takotsubo cardiomyopathy, HTN, heart failure, migraines, asthma, COPD, DM, ovarian CA, chem induced neuropathy, mixed hyperlipdemia.   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   7 month history of constant neck pain with bilateral arm pain that started in February after her hysterectomy. She has numbness, tingling, weakness in both hands, right > left. She is dropping things in both hands. Pain is worse with using her arms and in the morning. She is having dexterity issues and it's difficult for her to write/type. No alleviating factors. She's had some balance issues since BKA, but it is getting worse.   She is taking neurontin  and robaxin .   Tobacco use: Does not smoke.   Bowel/Bladder Dysfunction: none  Conservative measures:  Physical therapy: has not participated in for neck pain. Multimodal medical therapy including regular antiinflammatories:  Robaxin , Dilaudid , Gabapentin , Ibuprofen Injections:  no epidural steroid injections.  Past Surgery: no spine or neck surgery  SALA TAGUE has dexterity and balance issues.   The symptoms are causing a significant impact on the patient's life.   Review of Systems:  A 10 point review of systems is negative, except for the pertinent positives and negatives detailed in the HPI.  Past Medical History: Past Medical History:  Diagnosis Date   Abnormal cardiovascular stress test 09/21/2018   Formatting of this note might be different from the original. Lexiscan Myoview 09/16/2018 revealed mild anterior ischemia   Adverse effect of motion 05/10/2014   AKI (acute kidney injury) 10/22/2022   Anxiety    Arthritis    r knee   Asthma    Breast cyst 05/10/2014   CAD (coronary artery disease)    a.) LHC 06/04/2009 at  [facility]; non-obstructive CAD. b.) CTA with FFR 10/08/2018: extensive mixed plaque proximal to mid LAD (51-69%); Coronary Ca score 217; FFR 0.71 dPDA, 0.86 mLAD, 0.87 dLCx.   CCF (congestive cardiac failure) (HCC) 06/06/2008   a.) 30% EF. b.) TTE 06/03/2011: EF >55%; triv MR. c.) TTE 04/27/2018: EF 55%; mild LVH; triv PR, mild MR/TR; G1DD.   Cellulitis of left lower extremity 09/18/2022   Chest pain with high risk for cardiac etiology 07/02/2016   Chronic use of opiate drug for therapeutic purpose 02/13/2022   Formatting of this note might be different from the original. [facility] Pain Contract signed on 04/18/16 & updated 03/04/17; UDS done on 04/18/16.   Complication of anesthesia    Diabetic foot infection (HCC) 03/13/2015   Diabetic foot ulcer (HCC) 02/08/2021   Diabetic ulcer of left heel associated with type 2 diabetes mellitus (HCC) 02/08/2021   Eczema    Family history of adverse reaction to anesthesia    a.) PONV in mother and grandmother   Gas gangrene (HCC) 09/18/2022   GERD (gastroesophageal reflux disease) 05/10/2014   History of kidney stones    History of MSSA bacteremia due to osteomyelitis L foot 09/18/22 10/22/2022   HLD (hyperlipidemia)    Hyponatremia 03/13/2015   Migraines    Motion sickness    MSSA bacteremia 09/20/2022   Osteomyelitis (HCC) 09/18/2022   Other acute osteomyelitis, left ankle and foot (HCC) 02/13/2022   Panic attacks    Peripheral edema 04/27/2018   Pneumonia    PONV (postoperative nausea and vomiting)    Sepsis secondary to diabetic foot  infection 03/13/2015   Sepsis secondary to diabetic foot infection 03/13/2015   T2DM (type 2 diabetes mellitus) (HCC)    Takotsubo cardiomyopathy / transient apical balooning syndrome / stress-induced cardiomyopathy 05/10/2014   Unspecified essential hypertension     Past Surgical History: Past Surgical History:  Procedure Laterality Date   AMPUTATION Left 09/19/2022   Procedure: AMPUTATION BELOW KNEE;  Surgeon: Jama Cordella MATSU, MD;  Location: [facility];  Service: Vascular;  Laterality: Left;   AMPUTATION Left 09/24/2022   Procedure: AMPUTATION BELOW KNEE WITH WOUND CLOSURE;  Surgeon: Jama Cordella MATSU, MD;  Location: [facility];  Service: Vascular;  Laterality: Left;   AMPUTATION TOE Left 03/16/2015   Procedure: left fifth toe amputation with incision and drainage;  Surgeon: Eva Gay, DPM;  Location: [facility];  Service: Podiatry;  Laterality: Left;   APPLICATION OF WOUND VAC Left 02/14/2021   Procedure: APPLICATION OF WOUND VAC;  Surgeon: Gay Eva, DPM;  Location: [facility];  Service: Podiatry;  Laterality: Left;   APPLICATION OF WOUND VAC Left 04/16/2021   Procedure: APPLICATION OF WOUND VAC;  Surgeon: Lowery Estefana RAMAN, DO;  Location: [facility];  Service: Plastics;  Laterality: Left;   CARDIAC CATHETERIZATION Left 06/04/2009   Procedure: CARDIAC CATHETERIZATION; Location: [facility]   CHOLECYSTECTOMY     DEBRIDEMENT AND CLOSURE WOUND Left 04/16/2021   Procedure: DEBRIDEMENT AND CLOSURE WOUND;  Surgeon: Lowery Estefana RAMAN, DO;  Location:  SURGERY CENTER;  Service: Plastics;  Laterality: Left;  1 hour   IR IMAGING GUIDED PORT INSERTION  11/18/2022   IRRIGATION AND DEBRIDEMENT FOOT Left 02/08/2021   Procedure: IRRIGATION AND DEBRIDEMENT FOOT - LFT HEEL ULCER;  Surgeon: Gay Eva, DPM;  Location: [facility];  Service: Podiatry;  Laterality: Left;   IRRIGATION AND DEBRIDEMENT FOOT Left 02/14/2021   Procedure: IRRIGATION AND DEBRIDEMENT LEFT HEEL;  Surgeon: Gay Eva, DPM;  Location: [facility];  Service: Podiatry;  Laterality: Left;   kidney stone removal     KNEE ARTHROSCOPY W/ MENISCAL REPAIR     NOSE SURGERY  01/07/1989   due to fracture   TEE WITHOUT CARDIOVERSION N/A 09/26/2022   Procedure: TRANSESOPHAGEAL ECHOCARDIOGRAM;  Surgeon: Dewane Shiner, DO;  Location: [facility];  Service: Cardiovascular;  Laterality: N/A;   TOE AMPUTATION     second toe    TONSILLECTOMY     TOTAL KNEE ARTHROPLASTY  01/11/2011   Procedure: TOTAL KNEE ARTHROPLASTY;  Surgeon: Norleen LITTIE Gavel;  Location: [facility];  Service: Orthopedics;  Laterality: Right;  COMPUTER ASSISTED TOTAL KNEE REPLACEMENT    Allergies: Allergies as of 09/30/2023 - Review Complete 09/30/2023  Allergen Reaction Noted   Shellfish allergy Anaphylaxis 01/03/2011   Codeine Other (See Comments) 12/31/2010   Morphine  and codeine Other (See Comments) 12/31/2010   Rosuvastatin  01/03/2020   Percocet [oxycodone -acetaminophen ] Nausea And Vomiting 03/13/2015   Sulfa antibiotics Rash 05/10/2014   Theophyllines Itching and Rash 12/31/2010    Medications: Outpatient Encounter Medications as of 09/30/2023  Medication Sig   acidophilus (RISAQUAD) CAPS capsule Take 1 capsule by mouth daily.   albuterol  (PROVENTIL  HFA;VENTOLIN  HFA) 108 (90 Base) MCG/ACT inhaler Inhale 2 puffs into the lungs every 4 (four) hours as needed for wheezing or shortness of breath. Reported on 03/29/2015   albuterol  (PROVENTIL ) (2.5 MG/3ML) 0.083% nebulizer solution Take 2.5 mg by nebulization as needed.   ALPRAZolam  (XANAX ) 0.5 MG tablet Take 1 tablet (0.5 mg total) by mouth See admin instructions. Take 1 tablet 30 minutes prior  to imaging procedure, may take another tablet if needed.   ALPRAZolam  (XANAX ) 0.5 MG tablet Take 0.5-1 tablets (0.25-0.5 mg total) by mouth 2 (two) times daily as needed for anxiety. Take 1/2 to 1 tablet by mouth as needed for panic attacks   aspirin  EC 81 MG tablet Take 81 mg by mouth daily.   carvedilol  (COREG ) 6.25 MG tablet TAKE 1 TABLET BY MOUTH TWICE DAILY   Continuous Glucose Sensor (FREESTYLE LIBRE 2 SENSOR) MISC 1 each by Does not apply route every 14 (fourteen) days.   desvenlafaxine  (PRISTIQ ) 100 MG 24 hr tablet Take 1 tablet (100 mg total) by mouth daily.   dexamethasone  (DECADRON ) 4 MG tablet Take 2 tablets (8mg ) by mouth daily starting the day after carboplatin  for 2 days. Take with food    dicyclomine  (BENTYL ) 10 MG capsule Take 1 capsule (10 mg total) by mouth 4 (four) times daily -  before meals and at bedtime.   diphenoxylate -atropine  (LOMOTIL ) 2.5-0.025 MG tablet Take 1 tablet by mouth 4 (four) times daily as needed for diarrhea or loose stools.   Ensure Max Protein (ENSURE MAX PROTEIN) LIQD Take 330 mLs (11 oz total) by mouth 2 (two) times daily.   fenofibrate  54 MG tablet TAKE 1 TABLET BY MOUTH EVERY DAY   gabapentin  (NEURONTIN ) 300 MG capsule TAKE 1 CAPSULE (300MG  TOTAL) BY MOUTH AT BEDTIME AS NEEDED   HYDROmorphone  (DILAUDID ) 2 MG tablet Take 1 tablet (2 mg total) by mouth every 4 (four) hours as needed for severe pain (pain score 7-10).   ibuprofen (ADVIL) 800 MG tablet TAKE 1 TABLET BY MOUTH EVERY 8 HOURS AS NEEDED   insulin  NPH-regular Human (70-30) 100 UNIT/ML injection Inject 70 Units into the skin 2 (two) times daily with a meal.   lidocaine -prilocaine  (EMLA ) cream Apply to affected area once   lisinopril  (ZESTRIL ) 10 MG tablet Take 1 tablet (10 mg total) by mouth every morning AND 0.5 tablets (5 mg total) at bedtime.   lisinopril  (ZESTRIL ) 5 MG tablet Take 1 tablet (5 mg total) by mouth 2 (two) times daily.   methocarbamol  (ROBAXIN ) 500 MG tablet Take 1 tablet (500 mg total) by mouth every 8 (eight) hours as needed for muscle spasms.   mirtazapine  (REMERON ) 7.5 MG tablet TAKE 1 TABLET BY MOUTH AT BEDTIME.   montelukast  (SINGULAIR ) 10 MG tablet TAKE 1 TABLET BY MOUTH EVERYDAY AT BEDTIME   Multiple Vitamin (MULTIVITAMIN WITH MINERALS) TABS tablet Take 1 tablet by mouth daily.   naloxone  (NARCAN ) nasal spray 4 mg/0.1 mL SPRAY 1 SPRAY INTO ONE NOSTRIL AS DIRECTED FOR OPIOID OVERDOSE (TURN PERSON ON SIDE AFTER DOSE. IF NO RESPONSE IN 2-3 MINUTES OR PERSON RESPONDS BUT RELAPSES, REPEAT USING A NEW SPRAY DEVICE AND SPRAY INTO THE OTHER NOSTRIL. CALL 911 AFTER USE.) * EMERGENCY USE ONLY *   nystatin  (MYCOSTATIN /NYSTOP ) powder APPLY TOPICALLY TWICE DAILY AS NEEDED   nystatin   ointment (MYCOSTATIN ) Apply 1 Application topically 2 (two) times daily.   olaparib  (LYNPARZA ) 100 MG tablet Take 2 tablets (200 mg total) by mouth 2 (two) times daily. Swallow whole. May take with food to decrease nausea and vomiting.   ondansetron  (ZOFRAN ) 8 MG tablet Take 1 tablet (8 mg total) by mouth every 8 (eight) hours as needed for nausea or vomiting. Start on the third day after carboplatin .   ondansetron  (ZOFRAN -ODT) 8 MG disintegrating tablet Take 1 tablet (8 mg total) by mouth every 8 (eight) hours as needed for nausea or vomiting.   pantoprazole  (PROTONIX )  40 MG tablet TAKE 1 TABLET BY MOUTH EVERY DAY   pravastatin  (PRAVACHOL ) 40 MG tablet TAKE 1 TABLET BY MOUTH EVERY DAY   prochlorperazine  (COMPAZINE ) 10 MG tablet Take 1 tablet (10 mg total) by mouth every 6 (six) hours as needed for nausea or vomiting.   tirzepatide  (MOUNJARO ) 7.5 MG/0.5ML Pen Inject 7.5 mg into the skin once a week.   UBRELVY  100 MG TABS TAKE 1 TABLET (100 MG TOTAL) BY MOUTH AS NEEDED (MIGRAINE).   zinc  sulfate 220 (50 Zn) MG capsule Take 1 capsule (220 mg total) by mouth daily.   [DISCONTINUED] ascorbic acid  (VITAMIN C ) 500 MG tablet Take 1 tablet (500 mg total) by mouth 2 (two) times daily. (Patient not taking: Reported on 09/30/2023)   [DISCONTINUED] cholecalciferol (VITAMIN D3) 25 MCG (1000 UNIT) tablet Take 1,000 Units by mouth daily. (Patient not taking: Reported on 09/30/2023)   [DISCONTINUED] Cyanocobalamin  (VITAMIN B 12) 500 MCG TABS Take by mouth daily at 8 pm.   [DISCONTINUED] iron  polysaccharides (NIFEREX) 150 MG capsule Take 1 capsule (150 mg total) by mouth daily.   [DISCONTINUED] metFORMIN  (GLUCOPHAGE ) 500 MG tablet TAKE TWO TABLETS EACH MORNING. AND 3 TABLETS IN THE EVENING AS DIRECTED   No facility-administered encounter medications on file as of 09/30/2023.    Social History: Social History   Tobacco Use   Smoking status: Former    Current packs/day: 0.00    Types: Cigarettes    Quit date:  03/12/1985    Years since quitting: 38.5   Smokeless tobacco: Never  Vaping Use   Vaping status: Never Used  Substance Use Topics   Alcohol use: No   Drug use: No    Family Medical History: Family History  Problem Relation Age of Onset   Transient ischemic attack Mother    Asthma Mother    Hypertension Mother    Lung cancer Father 64   Hypertension Father    Asthma Sister    Anxiety disorder Sister    Asthma Sister    Hypertension Brother    Ovarian cancer Maternal Aunt 48   Melanoma Maternal Uncle        dx 44s   Lung cancer Paternal Aunt 54   Stomach cancer Maternal Grandmother    Colon cancer Maternal Grandmother    Lymphoma Maternal Grandmother    Melanoma Maternal Grandfather 72       sun exposure   Lung cancer Paternal Grandmother 27   Kidney cancer Paternal Grandfather        dx early   Crohn's disease Other    GER disease Other    Breast cancer Neg Hx     Physical Examination: Vitals:   09/30/23 1315 09/30/23 1350  BP: (!) 142/78 (!) 142/80    General: Patient is well developed, well nourished, calm, collected, and in no apparent distress. Attention to examination is appropriate.  Respiratory: Patient is breathing without any difficulty.   NEUROLOGICAL:     Awake, alert, oriented to person, place, and time.  Speech is clear and fluent. Fund of knowledge is appropriate.   Cranial Nerves: Pupils equal round and reactive to light.  Facial tone is symmetric.    No posterior cervical tenderness. Mild tenderness in bilateral trapezial region.   No abnormal lesions on exposed skin.   Strength: Side Biceps Triceps Deltoid Interossei Grip Wrist Ext. Wrist Flex.  R 5 5 5  4+ 5 5 5   L 5 5 5  4+ 5 5 5    Side Iliopsoas Quads  Hamstring PF DF EHL  R 5 5 5 5 5 5   L 5 5 5  --- --- ---   She has left BKA.   Reflexes are 2+ and symmetric at the biceps, brachioradialis,  right patella and  right achilles.   Hoffman's is absent.  Clonus is not present.    Bilateral upper and lower extremity sensation is intact to light touch, not tested below knee on left due to BKA.   She has reasonable ROM of both shoulders with pain.   She ambulates with a walker. Has unsteady gait with left BKA.    Medical Decision Making  Imaging: Cervical MRI dated 08/10/23:  FINDINGS: Alignment: Mild levoscoliosis. Mild reversal of the normal cervical lordosis. Trace facet mediated anterolisthesis of C2 on C3.   Vertebrae: Vertebral body height maintained without acute or chronic fracture. Bone marrow signal intensity within normal limits. No discrete or worrisome osseous lesions. Reactive marrow edema and enhancement present about the left C2-3 facet due to facet arthritis (series 104, image 13). No other abnormal marrow edema or enhancement.   Cord: Normal signal and morphology.  No abnormal enhancement.   Posterior Fossa, vertebral arteries, paraspinal tissues: Visualized brain and posterior fossa within normal limits. Craniocervical junction normal. Paraspinous and prevertebral soft tissues within normal limits. Normal intravascular flow voids seen within the vertebral arteries bilaterally.   Disc levels:   C2-C3: Negative interspace. Moderate left facet hypertrophy with associated reactive marrow edema and trace joint effusion. No spinal stenosis. Foramina remain patent.   C3-C4: Minimal disc bulge with right-sided uncovertebral spurring. No spinal stenosis. Foramina remain adequately patent.   C4-C5: Mild disc bulge with uncovertebral spurring, asymmetric to the right. Flattening of the ventral thecal sac without significant spinal stenosis. Mild right C5 foraminal narrowing. Left neural foramina remains patent.   C5-C6: Mild disc bulge with uncovertebral spurring. No significant spinal stenosis. Mild right C6 foraminal narrowing. Left neural foramina remains patent.   C6-C7: Broad-based right eccentric disc bulge flattens and  partially effaces the ventral thecal sac. Mild spinal stenosis. Superimposed uncovertebral spurring with moderate right and mild left C7 foraminal narrowing.   C7-T1:  Negative interspace.  No canal or foraminal stenosis.   Visualized upper thoracic spine demonstrates minor noncompressive disc bulging at T1-2 and T2-3 without significant stenosis.   IMPRESSION: 1. Moderate left-sided facet arthritis at C2-3 with associated reactive marrow edema. Finding could serve as a source for neck pain and referred symptoms. 2. Right eccentric disc bulge with uncovertebral spurring at C6-7, resulting in mild spinal stenosis, with moderate right C7 foraminal narrowing. 3. Mild right C5 and C6 foraminal narrowing related to disc bulge and uncovertebral disease.     Electronically Signed   By: Morene Hoard M.D.   On: 08/16/2023 03:18  I have personally reviewed the images and agree with the above interpretation.  Assessment and Plan: Ms. Celestin has a 7 month history of constant neck pain with bilateral arm pain that started in February after her hysterectomy. She has numbness, tingling, weakness in both hands, right > left. She is dropping things with both hands. She is having dexterity issues and it's difficult for her to write/type. She's had some balance issues since BKA, but it is getting worse.   She has known mild right foraminal stenosis C4-C6 with mild central stenosis C6-C7 and moderate right/mild left foraminal stenosis.   She has history of neuropathy from chemo- this may explain her dexterity issues.   Treatment options discussed with patient  and following plan made:   - EMG/NCS of bilateral upper extremities to further evaluate numbness, tingling, weakness in both hands, cervical radiculopathy versus peripheral neuropathy.  - Referral to [facility] at [facility]) to consider cervical injections.  - Recommend PT for cervical spine and she declines.  - Will  message her in 6 weeks to check on progress and then will follow up with her once EMG is complete to review results.   I spent a total of 45 minutes in face-to-face and non-face-to-face activities related to this patient's care today including review of outside records, review of imaging, review of symptoms, physical exam, discussion of differential diagnosis, discussion of treatment options, and documentation.   Thank you for involving me in the care of this patient.   Glade Boys PA-C Dept. of Neurosurgery

## 2023-09-29 ENCOUNTER — Other Ambulatory Visit: Payer: Self-pay

## 2023-09-30 ENCOUNTER — Ambulatory Visit (INDEPENDENT_AMBULATORY_CARE_PROVIDER_SITE_OTHER): Admitting: Orthopedic Surgery

## 2023-09-30 ENCOUNTER — Encounter: Payer: Self-pay | Admitting: Orthopedic Surgery

## 2023-09-30 ENCOUNTER — Telehealth: Payer: Self-pay | Admitting: Orthopedic Surgery

## 2023-09-30 VITALS — BP 142/80 | Ht 69.0 in | Wt 288.2 lb

## 2023-09-30 DIAGNOSIS — M79601 Pain in right arm: Secondary | ICD-10-CM

## 2023-09-30 DIAGNOSIS — R29898 Other symptoms and signs involving the musculoskeletal system: Secondary | ICD-10-CM | POA: Diagnosis not present

## 2023-09-30 DIAGNOSIS — M4802 Spinal stenosis, cervical region: Secondary | ICD-10-CM

## 2023-09-30 DIAGNOSIS — M79602 Pain in left arm: Secondary | ICD-10-CM

## 2023-09-30 DIAGNOSIS — M5412 Radiculopathy, cervical region: Secondary | ICD-10-CM

## 2023-09-30 DIAGNOSIS — M25511 Pain in right shoulder: Secondary | ICD-10-CM

## 2023-09-30 DIAGNOSIS — M47812 Spondylosis without myelopathy or radiculopathy, cervical region: Secondary | ICD-10-CM

## 2023-09-30 NOTE — Telephone Encounter (Signed)
 Faxed over signed emg Order

## 2023-09-30 NOTE — Patient Instructions (Signed)
 It was so nice to see you today. Thank you so much for coming in.    You have some wear and tear in your neck (arthritis) with some irritation of nerves on right. This is likely causing some of your neck and right arm pain.   Some of your pain is likely from your shoulders as well. May have you see ortho at some point.   The dropping things and dexterity issues may be from neuropathy. I want to get an EMG (nerve conduction test) to look into things further. I have ordered this and neurology at [facility] will call you to schedule. You can also call them at [phone]. I want you to see Dr. Maree.   I want you to see pain management here in [place] (Dr. Marcelino) to discuss possible cervical injections. They should call you to schedule an appointment or you can call them at [phone].   I will see you back after your nerve test, but I will also message you in 6 weeks to check on your progress.   Please do not hesitate to call if you have any questions or concerns. You can also message me in MyChart.   Glade Boys PA-C [phone]     The physicians and staff at [facility] at [address] [facility] are committed to providing excellent care. You may receive a survey asking for feedback about your experience at our office. We value you your feedback and appreciate you taking the time to to fill it out. The [facility] leadership team is also available to discuss your experience in person, feel free to contact us  [phone].

## 2023-09-30 NOTE — Telephone Encounter (Signed)
 Ordered EMG/NCS of bilateral Upper extremities with Dr. Maree at [facility].

## 2023-10-01 ENCOUNTER — Inpatient Hospital Stay: Attending: Oncology

## 2023-10-01 ENCOUNTER — Encounter: Payer: Self-pay | Admitting: Oncology

## 2023-10-01 ENCOUNTER — Inpatient Hospital Stay (HOSPITAL_BASED_OUTPATIENT_CLINIC_OR_DEPARTMENT_OTHER): Admitting: Oncology

## 2023-10-01 VITALS — BP 147/59 | HR 72 | Temp 97.1°F | Resp 18 | Wt 285.7 lb

## 2023-10-01 DIAGNOSIS — R971 Elevated cancer antigen 125 [CA 125]: Secondary | ICD-10-CM | POA: Insufficient documentation

## 2023-10-01 DIAGNOSIS — Z79899 Other long term (current) drug therapy: Secondary | ICD-10-CM | POA: Insufficient documentation

## 2023-10-01 DIAGNOSIS — K521 Toxic gastroenteritis and colitis: Secondary | ICD-10-CM

## 2023-10-01 DIAGNOSIS — Z1502 Genetic susceptibility to malignant neoplasm of ovary: Secondary | ICD-10-CM | POA: Diagnosis not present

## 2023-10-01 DIAGNOSIS — D509 Iron deficiency anemia, unspecified: Secondary | ICD-10-CM | POA: Insufficient documentation

## 2023-10-01 DIAGNOSIS — Z1509 Genetic susceptibility to other malignant neoplasm: Secondary | ICD-10-CM | POA: Insufficient documentation

## 2023-10-01 DIAGNOSIS — C569 Malignant neoplasm of unspecified ovary: Secondary | ICD-10-CM

## 2023-10-01 DIAGNOSIS — M542 Cervicalgia: Secondary | ICD-10-CM

## 2023-10-01 DIAGNOSIS — G629 Polyneuropathy, unspecified: Secondary | ICD-10-CM | POA: Diagnosis not present

## 2023-10-01 DIAGNOSIS — Z9221 Personal history of antineoplastic chemotherapy: Secondary | ICD-10-CM | POA: Diagnosis not present

## 2023-10-01 DIAGNOSIS — T451X5A Adverse effect of antineoplastic and immunosuppressive drugs, initial encounter: Secondary | ICD-10-CM | POA: Diagnosis not present

## 2023-10-01 DIAGNOSIS — M47812 Spondylosis without myelopathy or radiculopathy, cervical region: Secondary | ICD-10-CM

## 2023-10-01 DIAGNOSIS — D508 Other iron deficiency anemias: Secondary | ICD-10-CM | POA: Diagnosis not present

## 2023-10-01 DIAGNOSIS — C563 Malignant neoplasm of bilateral ovaries: Secondary | ICD-10-CM | POA: Insufficient documentation

## 2023-10-01 DIAGNOSIS — R110 Nausea: Secondary | ICD-10-CM

## 2023-10-01 LAB — CMP (CANCER CENTER ONLY)
ALT: 18 U/L (ref 0–44)
AST: 21 U/L (ref 15–41)
Albumin: 3.7 g/dL (ref 3.5–5.0)
Alkaline Phosphatase: 45 U/L (ref 38–126)
Anion gap: 9 (ref 5–15)
BUN: 29 mg/dL — ABNORMAL HIGH (ref 6–20)
CO2: 25 mmol/L (ref 22–32)
Calcium: 9.3 mg/dL (ref 8.9–10.3)
Chloride: 100 mmol/L (ref 98–111)
Creatinine: 1.02 mg/dL — ABNORMAL HIGH (ref 0.44–1.00)
GFR, Estimated: >60 mL/min (ref >60)
Glucose, Bld: 230 mg/dL — ABNORMAL HIGH (ref 70–99)
Potassium: 4.7 mmol/L (ref 3.5–5.1)
Sodium: 134 mmol/L — ABNORMAL LOW (ref 135–145)
Total Bilirubin: 0.5 mg/dL (ref 0.0–1.2)
Total Protein: 7.4 g/dL (ref 6.5–8.1)

## 2023-10-01 LAB — CBC WITH DIFFERENTIAL (CANCER CENTER ONLY)
Abs Immature Granulocytes: 0.06 K/uL (ref 0.00–0.07)
Basophils Absolute: 0.1 K/uL (ref 0.0–0.1)
Basophils Relative: 1 %
Eosinophils Absolute: 0.3 K/uL (ref 0.0–0.5)
Eosinophils Relative: 4 %
HCT: 35.5 % — ABNORMAL LOW (ref 36.0–46.0)
Hemoglobin: 11.5 g/dL — ABNORMAL LOW (ref 12.0–15.0)
Immature Granulocytes: 1 %
Lymphocytes Relative: 27 %
Lymphs Abs: 2.1 K/uL (ref 0.7–4.0)
MCH: 31.5 pg (ref 26.0–34.0)
MCHC: 32.4 g/dL (ref 30.0–36.0)
MCV: 97.3 fL (ref 80.0–100.0)
Monocytes Absolute: 0.5 K/uL (ref 0.1–1.0)
Monocytes Relative: 6 %
Neutro Abs: 4.7 K/uL (ref 1.7–7.7)
Neutrophils Relative %: 61 %
Platelet Count: 237 K/uL (ref 150–400)
RBC: 3.65 MIL/uL — ABNORMAL LOW (ref 3.87–5.11)
RDW: 13.6 % (ref 11.5–15.5)
WBC Count: 7.7 K/uL (ref 4.0–10.5)
nRBC: 0.3 % — ABNORMAL HIGH (ref 0.0–0.2)

## 2023-10-01 LAB — MAGNESIUM: Magnesium: 1.8 mg/dL (ref 1.7–2.4)

## 2023-10-01 NOTE — Assessment & Plan Note (Signed)
 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  Grade 1/ intermittent neuropathy of fingertips and toes.  Continue Gabapentin 300mg  PRN

## 2023-10-01 NOTE — Assessment & Plan Note (Signed)
 Continue home antiemetics PRN.

## 2023-10-01 NOTE — Assessment & Plan Note (Signed)
 Recommend Imodium as needed.- she takes prophylactic Imdium before each Olarparib.

## 2023-10-01 NOTE — Assessment & Plan Note (Signed)
 Neck pain with right upper extremity numbness MRI findings were reviewed and discussed with patient.  Patient has severe degenerative disease.  Established with neurosurgeon

## 2023-10-01 NOTE — Progress Notes (Signed)
 Hematology/Oncology Progress note [phone] [phone]      CHIEF COMPLAINTS  High grade serous carcinoma of ovaries.   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 Post treatment CT showed excellent response. S/p Debulking R1 on 02/20/2023, s/p 2 cycles of adjuvant carboplatin  taxol  Repeat CT results were reviewed with patient- minimal residual peritoneal thickening.  Discussed case with Gynonc Dr. Mancil, recommend additional 2 cycles followed by Olaparib  maintenance.  Labs are reviewed and discussed with patient. She did not tolerate Olaparib  300mg  BID due to severe diarrhea.   She tolerated 200mg  twice daily. She takes prophylactic Imodium before each dosage.  I recommend to go up dosage of 300mg  in AM and 200mg  in PM if she tolerates.  Aspirin  81mg  daily for thrombosis prophylaxis. .  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  Grade 1 intermittent neuropathy of fingertips and toes.  Continue Gabapentin  300mg  PRN  Neck pain Neck pain with right upper extremity numbness MRI findings were reviewed and discussed with patient.  Patient has severe degenerative disease.  Established with neurosurgeon  IDA (iron  deficiency anemia) Lab Results  Component Value Date   HGB 11.5 (L) 10/01/2023   TIBC 420 06/06/2023   IRONPCTSAT 21 06/06/2023   FERRITIN 84 06/06/2023    Hb is stable.  Chemotherapy-induced nausea Continue home antiemetics PRN.    Chemotherapy induced diarrhea Recommend Imodium as needed.- she takes prophylactic Imdium before each Olarparib.    Follow up 4 weeks   All questions were answered. The patient knows to call the clinic with any problems, questions or concerns.  Zelphia Cap,  MD, PhD [facility] Hematology Oncology 10/01/2023    HISTORY OF PRESENTING ILLNESS:  Nancy [name] 58 y.o. female presents to establish care for  peritoneal carcinomatosis I have reviewed her chart and materials related to her cancer extensively and collaborated history with the patient. 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 Aortic atherosclerosis    11/06/2022 Initial Diagnosis   Primary peritoneal carcinomatosis   -presented to emergency room for evaluation of abdominal pain/bloating, poor oral intake, nonbloody nonbilious vomiting  Patient underwent paracentesis, cytology showed malignant cells. IHC can not be added.    11/14/2022 Imaging   CT chest w contrast  1. Prominent subcentimeter right retrocrural lymph nodes, metastatic disease can not be excluded. 2. Nonspecific ground-glass nodule of the superior portion of the right lower lobe measuring 7 mm. Recommend attention on follow-up. 3. Small left pleural effusion. 4. Partially visualized large volume abdominal ascites and peritoneal thickening, volume of ascites appears increased when compared with the prior CT. 5. Coronary artery calcifications and aortic Atherosclerosis (ICD10-I70.0).   11/18/2022 Procedure   Medi port placement by IR   11/18/2022 Procedure   Omentum biopsy showed  1. Omentum, biopsy,  :   - INVOLVEMENT BY HIGH-GRADE SEROUS CARCINOMA OF GYNECOLOGIC ORIGIN.   Diagnosis Note : The carcinoma is positive for cytokeratin 7, PAX8, WT1, and ER.  Cytokeratin 20 and GATA3 are negative.  P53 is overexpressed.  The morphologic findings in  conjunction with the pattern of immunohistochemistry support the  above  diagnosis.   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 The report date is 11/25/2022.  The CancerNext-Expanded + RNAinsight gene panel offered by [employer] and includes sequencing and rearrangement analysis for the following 71 genes: AIP, ALK, APC*, ATM*, AXIN2, BAP1, BARD1, BMPR1A, BRCA1*, BRCA2*, BRIP1*, CDC73, CDH1*,CDK4, CDKN1B, CDKN2A, CHEK2*, CTNNA1, DICER1, FH, FLCN, KIF1B, LZTR1, MAX, MEN1, MET, MLH1*, MSH2*, MSH3, MSH6*, MUTYH*, NF1*, NF2, NTHL1, PALB2*, PHOX2B, PMS2*, POT1, PRKAR1A, PTCH1, PTEN*, RAD51C*, RAD51D*,RB1, RET, SDHA, SDHAF2, SDHB, SDHC, SDHD, SMAD4, SMARCA4, SMARCB1, SMARCE1, STK11, SUFU, TMEM127, TP53*,TSC1, TSC2, VHL; EGFR, EGLN1, HOXB13, KIT, MITF, PDGFRA, POLD1 and POLE (sequencing only); EPCAM and GREM1 (deletion/duplication only).    01/13/2023 Imaging   CT chest abdomen pelvis w contrast showed  Decreasing ascites, trace residual. The areas of omental caking and nodularity along the perineum is decreasing as well with significant residual particularly in the upper anterior abdomen.   Stable small mesenteric and retroperitoneal nodes. Resolved left pleural effusion. No developing new mass lesion. Small left renal angiomyolipoma and upper pole nonobstructing renal stone.   02/20/2023 Surgery    Interval debulking R1 - robotic assisted laparoscopic total hysterectomy, bilateral salpingo-oophorectomy, lysis of adhesions, peritoneal stripping, and omentectomy with stage IIIC high grade serous carcinoma of bilateral ovaries   A. Uterus, bilateral fallopian tubes, and bilateral ovaries, total hysterectomy and bilateral salpingo-oophorectomy: Residual high grade serous carcinoma (1.6 cm) involving bilateral ovaries, left fallopian tube, and uterine serosa.    Additional findings:  Uterus with leiomyomata (up to 1.1 cm) Right fallopian tube with no pathologic diagnosis See synoptic report.   B. Peritoneal nodules, excision: High grade serous carcinoma (up to 0.1 cm).  C. Omentum, omentectomy:  High grade serous carcinoma (0.7 cm). Moderate treatment response (CRS2).     03/04/2023 Cancer Staging   Staging form: Ovary, AJCC 7th Edition - Pathologic stage from 03/04/2023: FIGO Stage IIIC (cM0) - Signed by Babara Call, MD on 03/13/2023 Stage prefix: Initial diagnosis Laterality: Bilateral   History of sepsis secondary to diabetic foot infection/osteomyelitis status post left BKA done on 09/24/2022,    Today she is accompanied by significant other.  Patient is able to tolerate Olarparib at 200mg  BID dosage.  Manageable diarrhea with prophylactic Imodium prior to each dose of blood.No nausea vomiting.   New diagnosis of macular degeneration and is now getting injections in eyes  She has established care with neurosurgery for neck pain.   MEDICAL HISTORY:  Past Medical History:  Diagnosis Date   Abnormal cardiovascular stress test 09/21/2018   Formatting of this note might be different from the original. Lexiscan Myoview 09/16/2018 revealed mild anterior ischemia   Adverse effect of motion 05/10/2014   AKI (acute kidney injury) 10/22/2022   Anxiety    Arthritis    r knee   Asthma    Breast cyst 05/10/2014   CAD (coronary artery disease)    a.) LHC 06/04/2009 at [facility]; non-obstructive CAD. b.) CTA with FFR 10/08/2018: extensive mixed plaque proximal to mid LAD (51-69%); Coronary Ca score 217; FFR 0.71 dPDA, 0.86 mLAD, 0.87 dLCx.   CCF (congestive cardiac failure) (HCC) 06/06/2008   a.) 30% EF. b.) TTE 06/03/2011: EF >55%; triv MR. c.) TTE 04/27/2018: EF 55%; mild LVH;  triv PR, mild MR/TR; G1DD.   Cellulitis of left lower extremity 09/18/2022   Chest pain with high risk for cardiac etiology 07/02/2016   Chronic use of opiate drug for  therapeutic purpose 02/13/2022   Formatting of this note might be different from the original. [facility] Pain Contract signed on 04/18/16 & updated 03/04/17; UDS done on 04/18/16.   Complication of anesthesia    Diabetic foot infection (HCC) 03/13/2015   Diabetic foot ulcer (HCC) 02/08/2021   Diabetic ulcer of left heel associated with type 2 diabetes mellitus (HCC) 02/08/2021   Eczema    Family history of adverse reaction to anesthesia    a.) PONV in mother and grandmother   Gas gangrene (HCC) 09/18/2022   GERD (gastroesophageal reflux disease) 05/10/2014   History of kidney stones    History of MSSA bacteremia due to osteomyelitis L foot 09/18/22 10/22/2022   HLD (hyperlipidemia)    Hyponatremia 03/13/2015   Migraines    Motion sickness    MSSA bacteremia 09/20/2022   Osteomyelitis (HCC) 09/18/2022   Other acute osteomyelitis, left ankle and foot (HCC) 02/13/2022   Panic attacks    Peripheral edema 04/27/2018   Pneumonia    PONV (postoperative nausea and vomiting)    Sepsis secondary to diabetic foot infection 03/13/2015   Sepsis secondary to diabetic foot infection 03/13/2015   T2DM (type 2 diabetes mellitus) (HCC)    Takotsubo cardiomyopathy / transient apical balooning syndrome / stress-induced cardiomyopathy 05/10/2014   Unspecified essential hypertension     SURGICAL HISTORY: Past Surgical History:  Procedure Laterality Date   AMPUTATION Left 09/19/2022   Procedure: AMPUTATION BELOW KNEE;  Surgeon: Jama Cordella MATSU, MD;  Location: [facility];  Service: Vascular;  Laterality: Left;   AMPUTATION Left 09/24/2022   Procedure: AMPUTATION BELOW KNEE WITH WOUND CLOSURE;  Surgeon: Jama Cordella MATSU, MD;  Location: [facility];  Service: Vascular;  Laterality: Left;   AMPUTATION TOE Left 03/16/2015   Procedure: left fifth toe amputation with incision and drainage;  Surgeon: Eva Gay, DPM;  Location: [facility];  Service: Podiatry;  Laterality: Left;   APPLICATION OF WOUND VAC Left 02/14/2021    Procedure: APPLICATION OF WOUND VAC;  Surgeon: Gay Eva, DPM;  Location: [facility];  Service: Podiatry;  Laterality: Left;   APPLICATION OF WOUND VAC Left 04/16/2021   Procedure: APPLICATION OF WOUND VAC;  Surgeon: Lowery Estefana RAMAN, DO;  Location: [facility];  Service: Plastics;  Laterality: Left;   CARDIAC CATHETERIZATION Left 06/04/2009   Procedure: CARDIAC CATHETERIZATION; Location: [facility]   CHOLECYSTECTOMY     DEBRIDEMENT AND CLOSURE WOUND Left 04/16/2021   Procedure: DEBRIDEMENT AND CLOSURE WOUND;  Surgeon: Lowery Estefana RAMAN, DO;  Location: [facility];  Service: Plastics;  Laterality: Left;  1 hour   IR IMAGING GUIDED PORT INSERTION  11/18/2022   IRRIGATION AND DEBRIDEMENT FOOT Left 02/08/2021   Procedure: IRRIGATION AND DEBRIDEMENT FOOT - LFT HEEL ULCER;  Surgeon: Gay Eva, DPM;  Location: [facility];  Service: Podiatry;  Laterality: Left;   IRRIGATION AND DEBRIDEMENT FOOT Left 02/14/2021   Procedure: IRRIGATION AND DEBRIDEMENT LEFT HEEL;  Surgeon: Gay Eva, DPM;  Location: [facility];  Service: Podiatry;  Laterality: Left;   kidney stone removal     KNEE ARTHROSCOPY W/ MENISCAL REPAIR     NOSE SURGERY  01/07/1989   due to fracture   TEE WITHOUT CARDIOVERSION N/A 09/26/2022   Procedure: TRANSESOPHAGEAL ECHOCARDIOGRAM;  Surgeon: Dewane Shiner, DO;  Location: [facility];  Service: Cardiovascular;  Laterality: N/A;   TOE AMPUTATION     second toe   TONSILLECTOMY     TOTAL KNEE ARTHROPLASTY  01/11/2011   Procedure: TOTAL KNEE ARTHROPLASTY;  Surgeon: Norleen LITTIE Gavel;  Location: [facility];  Service: Orthopedics;  Laterality: Right;  COMPUTER ASSISTED TOTAL KNEE REPLACEMENT    SOCIAL HISTORY: Social History   Socioeconomic History   Marital status: Married    Spouse name: Not on file   Number of children: Not on file   Years of education: Not on file   Highest education level: Not on file  Occupational History   Not on file   Tobacco Use   Smoking status: Former    Current packs/day: 0.00    Types: Cigarettes    Quit date: 03/12/1985    Years since quitting: 38.5   Smokeless tobacco: Never  Vaping Use   Vaping status: Never Used  Substance and Sexual Activity   Alcohol use: No   Drug use: No   Sexual activity: Yes    Birth control/protection: Post-menopausal  Other Topics Concern   Not on file  Social History Narrative   Wife, Barnie, at bedside. One indoor pet, dog.   Social Drivers of Corporate investment banker Strain: Not on file  Food Insecurity: No Food Insecurity (10/22/2022)   Hunger Vital Sign    Worried About Running Out of Food in the Last Year: Never true    Ran Out of Food in the Last Year: Never true  Transportation Needs: No Transportation Needs (10/22/2022)   PRAPARE - Administrator, Civil Service (Medical): No    Lack of Transportation (Non-Medical): No  Physical Activity: Not on file  Stress: Not on file  Social Connections: Not on file  Intimate Partner Violence: Not At Risk (10/22/2022)   Humiliation, Afraid, Rape, and Kick questionnaire    Fear of Current or Ex-Partner: No    Emotionally Abused: No    Physically Abused: No    Sexually Abused: No    FAMILY HISTORY: Family History  Problem Relation Age of Onset   Transient ischemic attack Mother    Asthma Mother    Hypertension Mother    Lung cancer Father 22   Hypertension Father    Asthma Sister    Anxiety disorder Sister    Asthma Sister    Hypertension Brother    Ovarian cancer Maternal Aunt 82   Melanoma Maternal Uncle        dx 36s   Lung cancer Paternal Aunt 46   Stomach cancer Maternal Grandmother    Colon cancer Maternal Grandmother    Lymphoma Maternal Grandmother    Melanoma Maternal Grandfather 72       sun exposure   Lung cancer Paternal Grandmother 17   Kidney cancer Paternal Grandfather        dx early   Crohn's disease Other    GER disease Other    Breast cancer Neg Hx      ALLERGIES:  is allergic to shellfish allergy, codeine, morphine  and codeine, rosuvastatin, percocet [oxycodone -acetaminophen ], sulfa antibiotics, and theophyllines.  MEDICATIONS:  Current Outpatient Medications  Medication Sig Dispense Refill   acidophilus (RISAQUAD) CAPS capsule Take 1 capsule by mouth daily.     albuterol  (PROVENTIL  HFA;VENTOLIN  HFA) 108 (90 Base) MCG/ACT inhaler Inhale 2 puffs into the lungs every 4 (four) hours as needed for wheezing or shortness of breath. Reported on 03/29/2015 1 Inhaler 3   albuterol  (  PROVENTIL ) (2.5 MG/3ML) 0.083% nebulizer solution Take 2.5 mg by nebulization as needed.     ALPRAZolam  (XANAX ) 0.5 MG tablet Take 0.5-1 tablets (0.25-0.5 mg total) by mouth 2 (two) times daily as needed for anxiety. Take 1/2 to 1 tablet by mouth as needed for panic attacks 60 tablet 2   aspirin  EC 81 MG tablet Take 81 mg by mouth daily.     carvedilol  (COREG ) 6.25 MG tablet TAKE 1 TABLET BY MOUTH TWICE DAILY 60 tablet 02   Continuous Glucose Sensor (FREESTYLE LIBRE 2 SENSOR) MISC 1 each by Does not apply route every 14 (fourteen) days. 2 each 3   desvenlafaxine  (PRISTIQ ) 100 MG 24 hr tablet Take 1 tablet (100 mg total) by mouth daily. 90 tablet 0   dicyclomine  (BENTYL ) 10 MG capsule Take 1 capsule (10 mg total) by mouth 4 (four) times daily -  before meals and at bedtime. 60 capsule 1   diphenoxylate -atropine  (LOMOTIL ) 2.5-0.025 MG tablet Take 1 tablet by mouth 4 (four) times daily as needed for diarrhea or loose stools. 30 tablet 0   Ensure Max Protein (ENSURE MAX PROTEIN) LIQD Take 330 mLs (11 oz total) by mouth 2 (two) times daily.     fenofibrate  54 MG tablet TAKE 1 TABLET BY MOUTH EVERY DAY 30 tablet 11   gabapentin  (NEURONTIN ) 300 MG capsule TAKE 1 CAPSULE (300MG  TOTAL) BY MOUTH AT BEDTIME AS NEEDED 30 capsule 3   ibuprofen (ADVIL) 800 MG tablet TAKE 1 TABLET BY MOUTH EVERY 8 HOURS AS NEEDED 90 tablet 3   insulin  NPH-regular Human (70-30) 100 UNIT/ML injection  Inject 70 Units into the skin 2 (two) times daily with a meal. 50 mL 10   lidocaine -prilocaine  (EMLA ) cream Apply to affected area once 30 g 3   lisinopril  (ZESTRIL ) 10 MG tablet Take 1 tablet (10 mg total) by mouth every morning AND 0.5 tablets (5 mg total) at bedtime. 45 tablet 11   lisinopril  (ZESTRIL ) 5 MG tablet Take 1 tablet (5 mg total) by mouth 2 (two) times daily. (Patient taking differently: Take 5 mg by mouth at bedtime.) 180 tablet 1   methocarbamol  (ROBAXIN ) 500 MG tablet Take 1 tablet (500 mg total) by mouth every 8 (eight) hours as needed for muscle spasms. 30 tablet 0   mirtazapine  (REMERON ) 7.5 MG tablet TAKE 1 TABLET BY MOUTH AT BEDTIME. 90 tablet 2   montelukast  (SINGULAIR ) 10 MG tablet TAKE 1 TABLET BY MOUTH EVERYDAY AT BEDTIME 30 tablet 5   Multiple Vitamin (MULTIVITAMIN WITH MINERALS) TABS tablet Take 1 tablet by mouth daily. 30 tablet 2   nystatin  (MYCOSTATIN /NYSTOP ) powder APPLY TOPICALLY TWICE DAILY AS NEEDED 30 g 3   olaparib  (LYNPARZA ) 100 MG tablet Take 2 tablets (200 mg total) by mouth 2 (two) times daily. Swallow whole. May take with food to decrease nausea and vomiting. 120 tablet 0   ondansetron  (ZOFRAN ) 8 MG tablet Take 1 tablet (8 mg total) by mouth every 8 (eight) hours as needed for nausea or vomiting. Start on the third day after carboplatin . 30 tablet 1   ondansetron  (ZOFRAN -ODT) 8 MG disintegrating tablet Take 1 tablet (8 mg total) by mouth every 8 (eight) hours as needed for nausea or vomiting. 90 tablet 3   pantoprazole  (PROTONIX ) 40 MG tablet TAKE 1 TABLET BY MOUTH EVERY DAY 90 tablet 3   pravastatin  (PRAVACHOL ) 40 MG tablet TAKE 1 TABLET BY MOUTH EVERY DAY 30 tablet 5   prochlorperazine  (COMPAZINE ) 10 MG tablet Take 1 tablet (10  mg total) by mouth every 6 (six) hours as needed for nausea or vomiting. 90 tablet 1   tirzepatide  (MOUNJARO ) 7.5 MG/0.5ML Pen Inject 7.5 mg into the skin once a week. 2 mL 0   UBRELVY  100 MG TABS TAKE 1 TABLET (100 MG TOTAL) BY  MOUTH AS NEEDED (MIGRAINE). 12 tablet 1   zinc  sulfate 220 (50 Zn) MG capsule Take 1 capsule (220 mg total) by mouth daily. 30 capsule 0   HYDROmorphone  (DILAUDID ) 2 MG tablet Take 1 tablet (2 mg total) by mouth every 4 (four) hours as needed for severe pain (pain score 7-10). (Patient not taking: Reported on 10/01/2023) 60 tablet 0   naloxone  (NARCAN ) nasal spray 4 mg/0.1 mL SPRAY 1 SPRAY INTO ONE NOSTRIL AS DIRECTED FOR OPIOID OVERDOSE (TURN PERSON ON SIDE AFTER DOSE. IF NO RESPONSE IN 2-3 MINUTES OR PERSON RESPONDS BUT RELAPSES, REPEAT USING A NEW SPRAY DEVICE AND SPRAY INTO THE OTHER NOSTRIL. CALL 911 AFTER USE.) * EMERGENCY USE ONLY * (Patient not taking: Reported on 10/01/2023) 1 each 0   nystatin  ointment (MYCOSTATIN ) Apply 1 Application topically 2 (two) times daily. (Patient not taking: Reported on 10/01/2023) 30 g 0   No current facility-administered medications for this visit.    Review of Systems  Constitutional:  Negative for appetite change, chills, fatigue, fever and unexpected weight change.  HENT:   Negative for hearing loss and voice change.   Eyes:  Negative for eye problems.  Respiratory:  Negative for chest tightness, cough and shortness of breath.   Cardiovascular:  Negative for chest pain and leg swelling.  Gastrointestinal:  Positive for diarrhea. Negative for abdominal distention, abdominal pain and blood in stool.  Endocrine: Negative for hot flashes.  Genitourinary:  Negative for difficulty urinating and frequency.   Musculoskeletal:  Positive for neck pain. Negative for arthralgias.       S/p left  BKA   Skin:  Negative for itching and rash.  Neurological:  Positive for numbness. Negative for extremity weakness.  Hematological:  Negative for adenopathy.  Psychiatric/Behavioral:  Negative for confusion.      PHYSICAL EXAMINATION: ECOG PERFORMANCE STATUS: 1 - Symptomatic but completely ambulatory  Vitals:   10/01/23 1007 10/01/23 1014  BP: (!) 173/72 (!) 147/59   Pulse: 73 72  Resp: 18   Temp: (!) 97.1 F (36.2 C)    Filed Weights   10/01/23 1007  Weight: 285 lb 11.2 oz (129.6 kg)    Physical Exam Constitutional:      General: She is not in acute distress.    Appearance: She is obese. She is not diaphoretic.  HENT:     Head: Normocephalic and atraumatic.     Mouth/Throat:     Pharynx: No oropharyngeal exudate.  Eyes:     General: No scleral icterus. Cardiovascular:     Rate and Rhythm: Normal rate and regular rhythm.  Pulmonary:     Effort: Pulmonary effort is normal. No respiratory distress.     Breath sounds: Normal breath sounds. No wheezing.  Abdominal:     General: There is no distension.     Palpations: Abdomen is soft.     Tenderness: There is no abdominal tenderness.  Musculoskeletal:        General: Normal range of motion.     Cervical back: Normal range of motion and neck supple.     Comments: S/p left BKA  Skin:    General: Skin is warm and dry.     Findings:  No erythema.  Neurological:     Mental Status: She is alert and oriented to person, place, and time. Mental status is at baseline.     Cranial Nerves: No cranial nerve deficit.     Motor: No abnormal muscle tone.  Psychiatric:        Mood and Affect: Affect normal.      LABORATORY DATA:  I have reviewed the data as listed    Latest Ref Rng & Units 10/01/2023    9:45 AM 08/27/2023    1:31 PM 07/23/2023    9:18 AM  CBC  WBC 4.0 - 10.5 K/uL 7.7  10.1  7.5   Hemoglobin 12.0 - 15.0 g/dL 88.4  88.0  89.3   Hematocrit 36.0 - 46.0 % 35.5  36.3  32.4   Platelets 150 - 400 K/uL 237  224  216       Latest Ref Rng & Units 10/01/2023    9:45 AM 08/27/2023    1:30 PM 07/23/2023    9:18 AM  CMP  Glucose 70 - 99 mg/dL 769  827  789   BUN 6 - 20 mg/dL 29  27  31    Creatinine 0.44 - 1.00 mg/dL 8.97  8.84  9.01   Sodium 135 - 145 mmol/L 134  135  136   Potassium 3.5 - 5.1 mmol/L 4.7  4.6  4.3   Chloride 98 - 111 mmol/L 100  102  105   CO2 22 - 32 mmol/L 25  23   23    Calcium 8.9 - 10.3 mg/dL 9.3  9.1  9.0   Total Protein 6.5 - 8.1 g/dL 7.4  7.1  6.9   Total Bilirubin 0.0 - 1.2 mg/dL 0.5  0.4  0.4   Alkaline Phos 38 - 126 U/L 45  45  48   AST 15 - 41 U/L 21  26  25    ALT 0 - 44 U/L 18  22  31       RADIOGRAPHIC STUDIES: I have personally reviewed the radiological images as listed and agreed with the findings in the report. No results found.

## 2023-10-01 NOTE — Assessment & Plan Note (Addendum)
 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 Post treatment CT showed excellent response. S/p Debulking R1 on 02/20/2023, s/p 4 cycles of adjuvant carboplatin  taxol  Currently on Olaparib  maintenance.  Labs are reviewed and discussed with patient. She did not tolerate Olaparib  300mg  BID due to severe diarrhea.   She tolerated 200mg  twice daily. She takes prophylactic Imodium before each dosage.  She may try 300mg  in AM and 200mg  in PM if she able to tolerate.   Aspirin  81mg  daily for thrombosis prophylaxis. .  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

## 2023-10-01 NOTE — Progress Notes (Signed)
 Patient here for follow up. Pt reports that she was diagnosed with macular degeneration and is now getting injections in eyes. Pt was also seen by neurology and referral to pain management was made (for neck pain).

## 2023-10-01 NOTE — Assessment & Plan Note (Signed)
 Lab Results  Component Value Date   HGB 11.5 (L) 10/01/2023   TIBC 420 06/06/2023   IRONPCTSAT 21 06/06/2023   FERRITIN 84 06/06/2023    Hb is stable.

## 2023-10-02 LAB — CA 125: Cancer Antigen (CA) 125: 6.2 U/mL (ref 0.0–38.1)

## 2023-10-03 ENCOUNTER — Other Ambulatory Visit: Payer: Self-pay | Admitting: Family

## 2023-10-07 ENCOUNTER — Telehealth: Payer: Self-pay | Admitting: Family

## 2023-10-07 NOTE — Telephone Encounter (Signed)
 Pt has been sent to neurosurgeon, neurosurgeon sent her to neurology - Dr. Maree at [facility]. Cannot schedule with Dr. Maree until referral from PCP is sent to neurology. Please send referral ASAP.

## 2023-10-09 ENCOUNTER — Other Ambulatory Visit: Payer: Self-pay

## 2023-10-09 DIAGNOSIS — M4722 Other spondylosis with radiculopathy, cervical region: Secondary | ICD-10-CM

## 2023-10-09 NOTE — Telephone Encounter (Signed)
Pended referral to PCP

## 2023-10-16 ENCOUNTER — Other Ambulatory Visit: Payer: Self-pay | Admitting: Family

## 2023-10-16 ENCOUNTER — Other Ambulatory Visit: Payer: Self-pay | Admitting: Hospice and Palliative Medicine

## 2023-10-17 ENCOUNTER — Other Ambulatory Visit: Payer: Self-pay | Admitting: Family

## 2023-10-17 ENCOUNTER — Ambulatory Visit: Admitting: Family

## 2023-10-17 ENCOUNTER — Other Ambulatory Visit (INDEPENDENT_AMBULATORY_CARE_PROVIDER_SITE_OTHER): Payer: Self-pay | Admitting: Nurse Practitioner

## 2023-10-22 ENCOUNTER — Ambulatory Visit
Admission: RE | Admit: 2023-10-22 | Discharge: 2023-10-22 | Disposition: A | Discharge status: Home / Self Care | Source: Ambulatory Visit | Attending: Oncology | Admitting: Oncology

## 2023-10-22 ENCOUNTER — Inpatient Hospital Stay

## 2023-10-22 ENCOUNTER — Inpatient Hospital Stay: Attending: Oncology | Admitting: Obstetrics and Gynecology

## 2023-10-22 ENCOUNTER — Telehealth: Payer: Self-pay

## 2023-10-22 ENCOUNTER — Encounter: Payer: Self-pay | Admitting: Obstetrics and Gynecology

## 2023-10-22 VITALS — BP 146/69 | HR 80 | Resp 18 | Wt 283.0 lb

## 2023-10-22 DIAGNOSIS — Z1502 Genetic susceptibility to malignant neoplasm of ovary: Secondary | ICD-10-CM | POA: Insufficient documentation

## 2023-10-22 DIAGNOSIS — Z79899 Other long term (current) drug therapy: Secondary | ICD-10-CM | POA: Diagnosis not present

## 2023-10-22 DIAGNOSIS — Z1509 Genetic susceptibility to other malignant neoplasm: Secondary | ICD-10-CM | POA: Insufficient documentation

## 2023-10-22 DIAGNOSIS — Z9079 Acquired absence of other genital organ(s): Secondary | ICD-10-CM | POA: Insufficient documentation

## 2023-10-22 DIAGNOSIS — C482 Malignant neoplasm of peritoneum, unspecified: Secondary | ICD-10-CM | POA: Insufficient documentation

## 2023-10-22 DIAGNOSIS — Z9221 Personal history of antineoplastic chemotherapy: Secondary | ICD-10-CM | POA: Insufficient documentation

## 2023-10-22 DIAGNOSIS — Z90710 Acquired absence of both cervix and uterus: Secondary | ICD-10-CM | POA: Diagnosis not present

## 2023-10-22 DIAGNOSIS — C569 Malignant neoplasm of unspecified ovary: Secondary | ICD-10-CM | POA: Diagnosis present

## 2023-10-22 DIAGNOSIS — Z90722 Acquired absence of ovaries, bilateral: Secondary | ICD-10-CM | POA: Diagnosis not present

## 2023-10-22 MED ORDER — HEPARIN SOD (PORK) LOCK FLUSH 100 UNIT/ML IV SOLN
INTRAVENOUS | Status: AC
  Filled 2023-10-22: qty 5

## 2023-10-22 MED ORDER — HEPARIN SOD (PORK) LOCK FLUSH 100 UNIT/ML IV SOLN
500.0000 [IU] | Freq: Once | INTRAVENOUS | Status: AC
  Administered 2023-10-22: 500 [IU] via INTRAVENOUS

## 2023-10-22 MED ORDER — IOHEXOL 300 MG/ML  SOLN
100.0000 mL | Freq: Once | INTRAMUSCULAR | Status: AC | PRN
  Administered 2023-10-22: 100 mL via INTRAVENOUS

## 2023-10-22 NOTE — Progress Notes (Signed)
 Gynecologic Oncology follow up Visit   Referring Provider:  Dr. 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 G0 female with high grade serous ovarian cancer  Returns today for follow up. She was started on olaparib  300mg  BID on June 6th, 2025 but due to severe diarrhea and resumed successfully on lower dose 200 mg twice daily.  No new complaints.  Gynecologic Oncology History:  Presented from Dr. 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 Nancy [name] is a 59 y.o. female who presented with abdominal pain with CT showing large volume ascites and omental caking concerning for peritoneal carcinomatosis.  Patient has past medical history significant for type 2 diabetes, CAD, asthma, chronic pain, diabetic foot infection, GERD, hyperlipidemia, Takotsubo cardiomyopathy hospitalized from 9/11 to 09/30/2022 with sepsis secondary to diabetic foot infection/osteomyelitis s/p left BKA 9/17, also found to have Staph aureus bacteremia with TEE negative for vegetations, discharged home with 2 weeks of IV cefazolin , completed a few days ago, who presents to the ED with a 6-day history of nonbloody nonbilious vomiting Associated with generalized abdominal pain and distention.  States during her recent past hospitalization she initially presented with bleeding from her rectum but it spontaneously resolved.  She had abdominal pain and nausea but she attributed it to her foot infection.  She denies cough, chest pain, shortness of breath, leg pain, black or bloody stool except for one-time episode prior to her last hospitalization and denies, dysuria.  Hospital Course: Patient presents with abdominal pain/distention, found to have malignant ascites. Received a second therapeutic paracentesis on 10/21. Oncology following, suspects gyn primary. Final path report adenocarcinoma, but  insufficient material for IHC studies. Mild aki resolved and patient tolerating a diet.  10/26/22 CA125 =762, CEA and CA19-9 normal.  Maternal aunt with ovarian cancer.  No breast cancer in the family.   Patient completed 4 cycles of neoadjuvant carboplatin  and taxol  (175 mg/m2) for presumed mullerian primary cancer.   CA 125 10/26/22 762  11/21/22- 944 12/13/22-  332 01/03/23-  53.2 01/24/23- 14.3  01/13/2023- CT C/A/P w/ contrast - decreasing ascites, trace. Omental caking and nodularity along perineum decreasing as well with significant residual particularly in the upper anterior abdomen.  - stable small mesenteric and retroperitoneal nodes - resolved pleural effusion - small left renal angiomyolipoma and upper pole nonobstructing renal stone.   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 The report date is 11/25/2022.   The CancerNext-Expanded + RNAinsight gene panel offered by [employer] and includes sequencing and rearrangement analysis for the following 71 genes: AIP, ALK, APC*, ATM*, AXIN2, BAP1, BARD1, BMPR1A, BRCA1*, BRCA2*, BRIP1*, CDC73, CDH1*,CDK4, CDKN1B, CDKN2A, CHEK2*, CTNNA1, DICER1, FH, FLCN, KIF1B, LZTR1, MAX, MEN1, MET, MLH1*, MSH2*, MSH3, MSH6*, MUTYH*, NF1*, NF2, NTHL1, PALB2*, PHOX2B, PMS2*, POT1, PRKAR1A, PTCH1, PTEN*, RAD51C*, RAD51D*,RB1, RET, SDHA, SDHAF2, SDHB, SDHC, SDHD, SMAD4, SMARCA4, SMARCB1, SMARCE1, STK11, SUFU, TMEM127, TP53*,TSC1, TSC2, VHL; EGFR, EGLN1, HOXB13, KIT, MITF, PDGFRA, POLD1 and POLE (sequencing only); EPCAM and GREM1 (deletion/duplication only).   Myriad HRD positive. BRCA1/BRCA2 negative.   02/20/23 she underwent R1 robotic interval debulking at [facility] with residual miliary disease 3mm. A. Uterus, bilateral fallopian tubes, and bilateral ovaries, total hysterectomy and bilateral salpingo-oophorectomy: Residual high grade serous carcinoma (1.6 cm)  involving bilateral ovaries, left fallopian tube, and uterine serosa.   Additional findings:  Uterus with leiomyomata (up  to 1.1 cm) Right fallopian tube with no pathologic diagnosis   B. Peritoneal nodules, excision: High grade serous carcinoma (up to 0.1 cm).   C. Omentum, omentectomy:  High grade serous carcinoma (0.7 cm). Moderate treatment response (CRS2).   04/20/2023 CT C/A/P IMPRESSION: 1. Diminished peritoneal stranding and nodularity throughout the abdomen, with minimal residual thickening throughout, consistent with treatment response of peritoneal carcinomatosis. 2. Status post hysterectomy, oophorectomy, and omentectomy. 3. No evidence of lymphadenopathy or metastatic disease in the chest, abdomen, or pelvis. 4. Smoking-related respiratory bronchiolitis. 5. Hepatic steatosis. 6. Nonobstructive bilateral nephrolithiasis. 7. Coronary artery disease.   05/15/2023 She received additional chemotherapy for a total of eight cycles. Final cycle 05/15/2023.   Olaparib  initiated 6/25.   Mammogram normal 2021, no prior colonoscopy. Had an episode of rectal bleeding, but none now.  Problem List: Patient Active Problem List   Diagnosis Date Noted   Chronic obstructive pulmonary disease, unspecified COPD type (HCC) 07/17/2023   Chemotherapy induced diarrhea 06/27/2023   Hypomagnesemia 06/27/2023   Neck pain 06/27/2023   Port-A-Cath in place 06/27/2023   Atherosclerosis of native arteries of extremity with intermittent claudication 06/04/2023   Chemotherapy-induced neuropathy 03/13/2023   Neuropathy 03/13/2023   Generalized anxiety disorder 02/28/2023   Blurring of vision 12/13/2022   Chemotherapy-induced nausea 12/13/2022   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   Encounter for antineoplastic chemotherapy 11/21/2022   Neoplasm related pain 11/21/2022   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   Protein calorie malnutrition 11/06/2022    IDA (iron  deficiency anemia) 11/06/2022   Normocytic anemia 10/28/2022   Malignant ascites (HCC) 10/23/2022   Hypotension 10/22/2022   Intractable vomiting 10/22/2022   S/P left  BKA 09/24/22 secondary to osteomyelitis (below knee amputation) (HCC) 10/22/2022   Diarrhea 10/22/2022   Adjustment disorder with mixed anxiety and depressed mood 09/26/2022   B12 deficiency due to diet 05/16/2022   Vitamin D  deficiency, unspecified 05/16/2022   Atherosclerosis of native arteries of the extremities with ulceration (HCC) 01/30/2021   Diabetes mellitus, type 2 (HCC) 03/13/2015   Allergic rhinitis 05/10/2014   Asthma without status asthmaticus 05/10/2014   Atherosclerosis of native coronary artery of native heart with angina pectoris 05/10/2014   Clinical depression 05/10/2014   Neuropathy, diabetic (HCC) 05/10/2014   Acid reflux 05/10/2014   Mixed hyperlipidemia 05/10/2014   Migraine without aura and without status migrainosus, not intractable 05/10/2014   Takotsubo cardiomyopathy 05/10/2014   Obesity, Class III, BMI 40-49.9 (morbid obesity) (HCC) 05/10/2014   Essential hypertension 08/09/2008   Heart failure (HCC) 06/06/2008   Past Medical History: Past Medical History:  Diagnosis Date   Abnormal cardiovascular stress test 09/21/2018   Formatting of this note might be different from the original. Lexiscan Myoview 09/16/2018 revealed mild anterior ischemia   Adverse effect of motion 05/10/2014   AKI (acute kidney injury) 10/22/2022   Anxiety    Arthritis    r knee   Asthma    Breast cyst 05/10/2014   CAD (coronary artery disease)    a.) LHC 06/04/2009 at [facility]; non-obstructive CAD. b.) CTA with FFR 10/08/2018: extensive mixed plaque proximal to mid LAD (51-69%); Coronary Ca score 217; FFR 0.71 dPDA, 0.86 mLAD, 0.87 dLCx.   CCF (congestive cardiac failure) (HCC) 06/06/2008   a.) 30% EF. b.) TTE 06/03/2011: EF >55%; triv MR. c.) TTE 04/27/2018: EF 55%; mild LVH; triv PR, mild MR/TR; G1DD.    Cellulitis of left lower extremity 09/18/2022   Chest pain  with high risk for cardiac etiology 07/02/2016   Chronic use of opiate drug for therapeutic purpose 02/13/2022   Formatting of this note might be different from the original. [facility] Pain Contract signed on 04/18/16 & updated 03/04/17; UDS done on 04/18/16.   Complication of anesthesia    Diabetic foot infection (HCC) 03/13/2015   Diabetic foot ulcer (HCC) 02/08/2021   Diabetic ulcer of left heel associated with type 2 diabetes mellitus (HCC) 02/08/2021   Eczema    Family history of adverse reaction to anesthesia    a.) PONV in mother and grandmother   Gas gangrene (HCC) 09/18/2022   GERD (gastroesophageal reflux disease) 05/10/2014   History of kidney stones    History of MSSA bacteremia due to osteomyelitis L foot 09/18/22 10/22/2022   HLD (hyperlipidemia)    Hyponatremia 03/13/2015   Migraines    Motion sickness    MSSA bacteremia 09/20/2022   Osteomyelitis (HCC) 09/18/2022   Other acute osteomyelitis, left ankle and foot (HCC) 02/13/2022   Panic attacks    Peripheral edema 04/27/2018   Pneumonia    PONV (postoperative nausea and vomiting)    Sepsis secondary to diabetic foot infection 03/13/2015   Sepsis secondary to diabetic foot infection 03/13/2015   T2DM (type 2 diabetes mellitus) (HCC)    Takotsubo cardiomyopathy / transient apical balooning syndrome / stress-induced cardiomyopathy 05/10/2014   Unspecified essential hypertension    Past Surgical History: Past Surgical History:  Procedure Laterality Date   AMPUTATION Left 09/19/2022   Procedure: AMPUTATION BELOW KNEE;  Surgeon: Jama Cordella MATSU, MD;  Location: [facility];  Service: Vascular;  Laterality: Left;   AMPUTATION Left 09/24/2022   Procedure: AMPUTATION BELOW KNEE WITH WOUND CLOSURE;  Surgeon: Jama Cordella MATSU, MD;  Location: [facility];  Service: Vascular;  Laterality: Left;   AMPUTATION TOE Left 03/16/2015   Procedure: left fifth toe amputation with incision  and drainage;  Surgeon: Eva Gay, DPM;  Location: [facility];  Service: Podiatry;  Laterality: Left;   APPLICATION OF WOUND VAC Left 02/14/2021   Procedure: APPLICATION OF WOUND VAC;  Surgeon: Gay Eva, DPM;  Location: [facility];  Service: Podiatry;  Laterality: Left;   APPLICATION OF WOUND VAC Left 04/16/2021   Procedure: APPLICATION OF WOUND VAC;  Surgeon: Lowery Estefana RAMAN, DO;  Location: [facility];  Service: Plastics;  Laterality: Left;   CARDIAC CATHETERIZATION Left 06/04/2009   Procedure: CARDIAC CATHETERIZATION; Location: [facility]   CHOLECYSTECTOMY     DEBRIDEMENT AND CLOSURE WOUND Left 04/16/2021   Procedure: DEBRIDEMENT AND CLOSURE WOUND;  Surgeon: Lowery Estefana RAMAN, DO;  Location: [facility];  Service: Plastics;  Laterality: Left;  1 hour   IR IMAGING GUIDED PORT INSERTION  11/18/2022   IRRIGATION AND DEBRIDEMENT FOOT Left 02/08/2021   Procedure: IRRIGATION AND DEBRIDEMENT FOOT - LFT HEEL ULCER;  Surgeon: Gay Eva, DPM;  Location: [facility];  Service: Podiatry;  Laterality: Left;   IRRIGATION AND DEBRIDEMENT FOOT Left 02/14/2021   Procedure: IRRIGATION AND DEBRIDEMENT LEFT HEEL;  Surgeon: Gay Eva, DPM;  Location: [facility];  Service: Podiatry;  Laterality: Left;   kidney stone removal     KNEE ARTHROSCOPY W/ MENISCAL REPAIR     NOSE SURGERY  01/07/1989   due to fracture   TEE WITHOUT CARDIOVERSION N/A 09/26/2022   Procedure: TRANSESOPHAGEAL ECHOCARDIOGRAM;  Surgeon: Dewane Shiner, DO;  Location: [facility];  Service: Cardiovascular;  Laterality: N/A;   TOE AMPUTATION     second toe  TONSILLECTOMY     TOTAL KNEE ARTHROPLASTY  01/11/2011   Procedure: TOTAL KNEE ARTHROPLASTY;  Surgeon: Norleen LITTIE Gavel;  Location: [facility];  Service: Orthopedics;  Laterality: Right;  COMPUTER ASSISTED TOTAL KNEE REPLACEMENT   OB History:  OB History  Gravida Para Term Preterm AB Living  0 0 0 0 0 0  SAB IAB Ectopic Multiple Live Births   0 0 0 0 0   Family History: Family History  Problem Relation Age of Onset   Transient ischemic attack Mother    Asthma Mother    Hypertension Mother    Lung cancer Father 54   Hypertension Father    Asthma Sister    Anxiety disorder Sister    Asthma Sister    Hypertension Brother    Ovarian cancer Maternal Aunt 64   Melanoma Maternal Uncle        dx 42s   Lung cancer Paternal Aunt 65   Stomach cancer Maternal Grandmother    Colon cancer Maternal Grandmother    Lymphoma Maternal Grandmother    Melanoma Maternal Grandfather 72       sun exposure   Lung cancer Paternal Grandmother 73   Kidney cancer Paternal Grandfather        dx early   Crohn's disease Other    GER disease Other    Breast cancer Neg Hx     Social History: Social History   Socioeconomic History   Marital status: Married    Spouse name: Not on file   Number of children: Not on file   Years of education: Not on file   Highest education level: Not on file  Occupational History   Not on file  Tobacco Use   Smoking status: Former    Current packs/day: 0.00    Types: Cigarettes    Quit date: 03/12/1985    Years since quitting: 38.6   Smokeless tobacco: Never  Vaping Use   Vaping status: Never Used  Substance and Sexual Activity   Alcohol use: No   Drug use: No   Sexual activity: Yes    Birth control/protection: Post-menopausal  Other Topics Concern   Not on file  Social History Narrative   Wife, Nancy [name], at bedside. One indoor pet, dog.   Social Drivers of Corporate investment banker Strain: Not on file  Food Insecurity: No Food Insecurity (10/22/2022)   Hunger Vital Sign    Worried About Running Out of Food in the Last Year: Never true    Ran Out of Food in the Last Year: Never true  Transportation Needs: No Transportation Needs (10/22/2022)   PRAPARE - Administrator, Civil Service (Medical): No    Lack of Transportation (Non-Medical): No  Physical Activity: Not on file   Stress: Not on file  Social Connections: Not on file  Intimate Partner Violence: Not At Risk (10/22/2022)   Humiliation, Afraid, Rape, and Kick questionnaire    Fear of Current or Ex-Partner: No    Emotionally Abused: No    Physically Abused: No    Sexually Abused: No    Allergies: Allergies  Allergen Reactions   Shellfish Allergy Anaphylaxis   Codeine Other (See Comments)    Migraine  Other Reaction(s): Other (See Comments)  Reaction:  Severe migraines   Morphine  And Codeine Other (See Comments)    Reaction:  Severe migraines    Rosuvastatin     Other reaction(s): Muscle Pain   Percocet [Oxycodone -Acetaminophen ] Nausea And Vomiting  Sulfa Antibiotics Rash   Theophyllines Itching and Rash    Current Medications: Current Outpatient Medications  Medication Sig Dispense Refill   acidophilus (RISAQUAD) CAPS capsule Take 1 capsule by mouth daily.     albuterol  (PROVENTIL  HFA;VENTOLIN  HFA) 108 (90 Base) MCG/ACT inhaler Inhale 2 puffs into the lungs every 4 (four) hours as needed for wheezing or shortness of breath. Reported on 03/29/2015 1 Inhaler 3   albuterol  (PROVENTIL ) (2.5 MG/3ML) 0.083% nebulizer solution Take 2.5 mg by nebulization as needed.     ALPRAZolam  (XANAX ) 0.5 MG tablet Take 0.5-1 tablets (0.25-0.5 mg total) by mouth 2 (two) times daily as needed for anxiety. Take 1/2 to 1 tablet by mouth as needed for panic attacks 60 tablet 2   aspirin  EC 81 MG tablet Take 81 mg by mouth daily.     carvedilol  (COREG ) 6.25 MG tablet TAKE 1 TABLET BY MOUTH TWICE DAILY 180 tablet 1   Continuous Glucose Sensor (FREESTYLE LIBRE 2 SENSOR) MISC 1 each by Does not apply route every 14 (fourteen) days. 2 each 3   desvenlafaxine  (PRISTIQ ) 100 MG 24 hr tablet Take 1 tablet (100 mg total) by mouth daily. 90 tablet 0   dicyclomine  (BENTYL ) 10 MG capsule Take 1 capsule (10 mg total) by mouth 4 (four) times daily -  before meals and at bedtime. 60 capsule 1   diphenoxylate -atropine  (LOMOTIL )  2.5-0.025 MG tablet Take 1 tablet by mouth 4 (four) times daily as needed for diarrhea or loose stools. 30 tablet 0   Ensure Max Protein (ENSURE MAX PROTEIN) LIQD Take 330 mLs (11 oz total) by mouth 2 (two) times daily.     fenofibrate  54 MG tablet TAKE 1 TABLET BY MOUTH EVERY DAY 30 tablet 11   gabapentin  (NEURONTIN ) 300 MG capsule TAKE 1 CAPSULE BY MOUTH EVERY NIGHT AT BEDTIME 60 capsule 0   ibuprofen (ADVIL) 800 MG tablet TAKE 1 TABLET BY MOUTH EVERY 8 HOURS AS NEEDED 90 tablet 3   insulin  NPH-regular Human (70-30) 100 UNIT/ML injection Inject 70 Units into the skin 2 (two) times daily with a meal. 50 mL 10   lidocaine -prilocaine  (EMLA ) cream Apply to affected area once 30 g 3   lisinopril  (ZESTRIL ) 10 MG tablet Take 1 tablet (10 mg total) by mouth every morning AND 0.5 tablets (5 mg total) at bedtime. 45 tablet 11   lisinopril  (ZESTRIL ) 5 MG tablet TAKE 1 TABLET BY MOUTH TWICE DAILY 180 tablet 1   metFORMIN  (GLUCOPHAGE ) 500 MG tablet TAKE 2 TABLET BY MOUTH IN THE MORNING AND 3 TABLET BY MOUTH IN THE EVENING 450 tablet 1   methocarbamol  (ROBAXIN ) 500 MG tablet Take 1 tablet (500 mg total) by mouth every 8 (eight) hours as needed for muscle spasms. 30 tablet 0   mirtazapine  (REMERON ) 7.5 MG tablet TAKE 1 TABLET BY MOUTH EVERY NIGHT AT BEDTIME 90 tablet 2   montelukast  (SINGULAIR ) 10 MG tablet TAKE 1 TABLET BY MOUTH EVERY DAY 90 tablet 1   MOUNJARO  7.5 MG/0.5ML Pen ADMINISTER 7.5 MG UNDER THE SKIN 1 TIME A WEEK 2 mL 0   Multiple Vitamin (MULTIVITAMIN WITH MINERALS) TABS tablet Take 1 tablet by mouth daily. 30 tablet 2   nystatin  (MYCOSTATIN /NYSTOP ) powder APPLY TOPICALLY TWICE DAILY AS NEEDED 30 g 3   olaparib  (LYNPARZA ) 100 MG tablet Take 2 tablets (200 mg total) by mouth 2 (two) times daily. Swallow whole. May take with food to decrease nausea and vomiting. 120 tablet 0   ondansetron  (ZOFRAN ) 8 MG  tablet Take 1 tablet (8 mg total) by mouth every 8 (eight) hours as needed for nausea or vomiting.  Start on the third day after carboplatin . 30 tablet 1   ondansetron  (ZOFRAN -ODT) 8 MG disintegrating tablet Take 1 tablet (8 mg total) by mouth every 8 (eight) hours as needed for nausea or vomiting. 90 tablet 3   pantoprazole  (PROTONIX ) 40 MG tablet TAKE 1 TABLET BY MOUTH EVERY DAY 90 tablet 3   pravastatin  (PRAVACHOL ) 40 MG tablet TAKE 1 TABLET BY MOUTH EVERY DAY 30 tablet 5   prochlorperazine  (COMPAZINE ) 10 MG tablet Take 1 tablet (10 mg total) by mouth every 6 (six) hours as needed for nausea or vomiting. 90 tablet 1   UBRELVY  100 MG TABS TAKE 1 TABLET (100 MG TOTAL) BY MOUTH AS NEEDED (MIGRAINE). 12 tablet 1   zinc  sulfate 220 (50 Zn) MG capsule Take 1 capsule (220 mg total) by mouth daily. 30 capsule 0   HYDROmorphone  (DILAUDID ) 2 MG tablet Take 1 tablet (2 mg total) by mouth every 4 (four) hours as needed for severe pain (pain score 7-10). (Patient not taking: Reported on 10/22/2023) 60 tablet 0   naloxone  (NARCAN ) nasal spray 4 mg/0.1 mL SPRAY 1 SPRAY INTO ONE NOSTRIL AS DIRECTED FOR OPIOID OVERDOSE (TURN PERSON ON SIDE AFTER DOSE. IF NO RESPONSE IN 2-3 MINUTES OR PERSON RESPONDS BUT RELAPSES, REPEAT USING A NEW SPRAY DEVICE AND SPRAY INTO THE OTHER NOSTRIL. CALL 911 AFTER USE.) * EMERGENCY USE ONLY * (Patient not taking: Reported on 10/22/2023) 1 each 0   nystatin  ointment (MYCOSTATIN ) Apply 1 Application topically 2 (two) times daily. (Patient not taking: Reported on 10/22/2023) 30 g 0   No current facility-administered medications for this visit.   Review of Systems General:  no complaints Skin: no complaints Eyes: no complaints HEENT: no complaints Breasts: no complaints Pulmonary: no complaints Cardiac: no complaints Gastrointestinal: no complaints Genitourinary/Sexual: no complaints Ob/Gyn: no complaints Musculoskeletal: no complaints Hematology: no complaints Neurologic/Psych: no complaints    Objective:  Physical Examination:  Vitals:   10/22/23 1026  BP: (!) 165/67   Pulse: 80  Resp: 18  SpO2: 98%   ECOG Performance Status: 1 - Symptomatic but completely ambulatory  GENERAL: Patient is a well appearing female in no acute distress HEENT: Atraumatic normocephalic NODES:  No cervical, supraclavicular, axillary, or inguinal lymphadenopathy palpated.  LUNGS: Normal respiratory effort  ABDOMEN:  Soft, nontender.  Nondistended.  No masses hernias or ascites EXTREMITIES:  No peripheral edema.   SKIN: Moist erythematous skin underlying the pannus NEURO:  Nonfocal. Well oriented.  Appropriate affect.  Pelvic: Chaperone by NP EGBUS: no lesions Cervix: Surgically absent Vagina: no lesions, no discharge or bleeding on speculum exam.  Palpation there is a band on the left side of the vagina and you can palpate deeper on the right.  The vaginal cuff is intact and there is no masses or nodularity Uterus: Surgically absent BME: no palpable masses     Assessment:  Nancy [name] is a 58 y.o. 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 CA125 elevated at 762, and CEA and CA19-9 normal. 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 Excellent response to carboplatin /taxol  x 4 cycles with CA125 17 1/25. On 02/20/23 she underwent R1 robotic interval debulking with hysterectomy, BSO and debulking at [facility] with residual miliary disease 3mm. She then received 4 additional cycles of chemotherapy and CA125 normalized.  Initiated on olaparib  6/25.  Olaparib  dose reduced due diarrhea.  Tolerating well now.   Pathogenic variant in BRIP1 gene.   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    Follow-up with Dr. Babara with regard to her continued treatment and management of other medical related issues.  Discussed that hereditary genetic panel testing results and her family is getting cascade testing. Her mother had the BRIP1 gene mutation.   We can see her back in 3 months.   The patient's diagnosis, an outline of the further diagnostic and laboratory studies which will be required, the recommendation, and alternatives were discussed.  All questions were answered to the patient's satisfaction.   Tinnie Dawn, DNP, AGNP-C, AOCNP [facility] [phone] (clinic)  I personally had a face to face interaction and evaluated the patient jointly with the NP, Ms. Tinnie Dawn.  I have reviewed her history and available records and have performed the key portions of the physical exam including lymph node survey, abdominal exam, pelvic exam with my findings confirming those documented above by the APP.  I have discussed the case with the APP and the patient.  I agree with the above documentation, assessment and plan which was fully formulated by me.  Counseling was completed by me.   I personally saw the patient and performed a substantive portion of this encounter in conjunction with the listed APP as documented above.  Prentice Agent, MD

## 2023-10-22 NOTE — Telephone Encounter (Signed)
 Pt called to follow up on her neurologist referral she has requested for Dr.Shah. pt states they have not received it and they need it faxed to [phone]

## 2023-10-23 ENCOUNTER — Other Ambulatory Visit: Payer: Self-pay

## 2023-10-23 ENCOUNTER — Other Ambulatory Visit: Payer: Self-pay | Admitting: Oncology

## 2023-10-23 ENCOUNTER — Telehealth: Payer: Self-pay

## 2023-10-23 NOTE — Progress Notes (Signed)
 Specialty Pharmacy Refill Coordination Note  REGINE CHRISTIAN is a 58 y.o. female contacted today regarding refills of specialty medication(s) Olaparib  (LYNPARZA )   Patient requested (Patient-Rptd) Delivery   Delivery date: 10/29/23   Verified address: (Patient-Rptd) [address] [place] [address]   Medication will be filled on 10/28/23.

## 2023-10-23 NOTE — Telephone Encounter (Signed)
 Pt LM asking for referral to Dr Loreli and pt asked for it to be asap

## 2023-10-23 NOTE — Telephone Encounter (Signed)
 This order has already been pended on 10/2

## 2023-10-23 NOTE — Telephone Encounter (Signed)
 See other message the order was pended on 10/2

## 2023-10-24 ENCOUNTER — Other Ambulatory Visit: Payer: Self-pay

## 2023-10-24 ENCOUNTER — Encounter: Payer: Self-pay | Admitting: Family

## 2023-10-24 MED ORDER — OLAPARIB 100 MG PO TABS
200.0000 mg | ORAL_TABLET | Freq: Two times a day (BID) | ORAL | 0 refills | Status: DC
  Filled 2023-10-24: qty 120, 30d supply, fill #0

## 2023-10-27 ENCOUNTER — Other Ambulatory Visit (HOSPITAL_COMMUNITY): Payer: Self-pay

## 2023-10-27 ENCOUNTER — Other Ambulatory Visit: Payer: Self-pay | Admitting: Oncology

## 2023-10-27 ENCOUNTER — Other Ambulatory Visit: Payer: Self-pay

## 2023-10-27 ENCOUNTER — Telehealth: Payer: Self-pay

## 2023-10-27 ENCOUNTER — Other Ambulatory Visit: Payer: Self-pay | Admitting: Family

## 2023-10-27 MED ORDER — OLAPARIB 100 MG PO TABS
100.0000 mg | ORAL_TABLET | Freq: Two times a day (BID) | ORAL | 0 refills | Status: DC
  Filled 2023-10-27 – 2023-11-06 (×3): qty 60, 30d supply, fill #0

## 2023-10-27 MED ORDER — OLAPARIB 150 MG PO TABS
150.0000 mg | ORAL_TABLET | Freq: Two times a day (BID) | ORAL | 0 refills | Status: DC
  Filled 2023-10-27 – 2023-11-06 (×3): qty 60, 30d supply, fill #0

## 2023-10-27 NOTE — Progress Notes (Signed)
 Clinical Intervention Note  Clinical Intervention Notes: Patient reported on questionnaire that she has been taking Lynparza  250mg  BID. Called patient to confirm as previous chart note said Dr. Babara was waiting to see how she tolerated the 200 mg before possibily increasing to 300 mg in the morning and 200 mg in the evening. Called patient and she stated she has been taking 100 mg tablet and 150 mg tablet (from a previous prescription) with no issues. Spoke with Izetta Lied who talked with provider and patient should have only been taking 200 mg BID until appt on 10/28. Advised patient that only prescription on file was for 200 mg BID. Patient asked to delay shipment of refill in the event that her dose changes at the next appointment.   Clinical Intervention Outcomes: Improved therapy adherence   [facility] Specialty Pharmacist

## 2023-10-27 NOTE — Telephone Encounter (Addendum)
 Oncology Pharmacist Encounter  Received message from specialty pharmacist requesting clarification on a dosage as patient was wondering if her Lynparza  was going to increase. Discussed with Dr. Babara, that she would like to see patient on 10/28 prior to increasing the dosage. Notified specialty pharmacy. They will send out the rx as written so patient does not go without medicine prior to the appointment. MD notified as well and if patient is to increase, she will have the tablets to be able to do this increase. Specialty pharmacist will call the patient to notify them of the plan.   Addendum: Specialty pharmacist spoke to patient who states she was using leftover medication from before to make the 250mg  BID dosage (1 150mg  tablet + 1 100mg  tablet - twice daily). MD asked if this is possible to continue. Message sent to [employer] to see if insurance will cover- since insurance is not posing an issue for test claiming both strengths, MD will send in both prescriptions for patient to take a total dose of 250mg  BID.   Charan Prieto, PharmD Hematology/Oncology Clinical Pharmacist [facility] [phone]

## 2023-10-28 NOTE — Telephone Encounter (Signed)
 This was sent.

## 2023-10-29 ENCOUNTER — Telehealth: Payer: Self-pay | Admitting: *Deleted

## 2023-10-29 NOTE — Telephone Encounter (Signed)
 Patient says now this is the third day of a 99.5 and I asked her about if she has been around anybody.  She says that she has been in a store couple of times but she did not like feeling anybody being in the around her and but the only thing that is bothering her is having the temperature.  He wanted to see what Dr. Babara would say to her. She usually stays  98.7?

## 2023-10-29 NOTE — Telephone Encounter (Addendum)
 Call returned to pt asking if she is having any other symptoms. If no other symptoms then monitoring is recommended.   Mychart message also sent

## 2023-10-29 NOTE — Telephone Encounter (Signed)
 Patient called and said she had a low-grade fever for 2 to 3 days and it got up to 99.5 she has a procedure that she is supposed to go and she canceled it.  Wanted to know should she be seen or what to do.  I did try calling her and she did not answer and I just asked if she had been around somebody that may be sick.  I told her she can call me back and then I can send it over to Dr. Babara.

## 2023-10-31 ENCOUNTER — Encounter: Payer: Self-pay | Admitting: Oncology

## 2023-11-02 ENCOUNTER — Other Ambulatory Visit: Payer: Self-pay | Admitting: Family

## 2023-11-03 ENCOUNTER — Encounter: Payer: Self-pay | Admitting: Oncology

## 2023-11-04 ENCOUNTER — Encounter: Payer: Self-pay | Admitting: Oncology

## 2023-11-04 ENCOUNTER — Other Ambulatory Visit (HOSPITAL_COMMUNITY): Payer: Self-pay

## 2023-11-04 ENCOUNTER — Inpatient Hospital Stay (HOSPITAL_BASED_OUTPATIENT_CLINIC_OR_DEPARTMENT_OTHER): Admitting: Oncology

## 2023-11-04 ENCOUNTER — Telehealth: Payer: Self-pay

## 2023-11-04 ENCOUNTER — Other Ambulatory Visit: Payer: Self-pay

## 2023-11-04 ENCOUNTER — Encounter: Payer: Self-pay | Admitting: Student in an Organized Health Care Education/Training Program

## 2023-11-04 ENCOUNTER — Ambulatory Visit
Attending: Student in an Organized Health Care Education/Training Program | Admitting: Student in an Organized Health Care Education/Training Program

## 2023-11-04 ENCOUNTER — Inpatient Hospital Stay

## 2023-11-04 VITALS — BP 158/59 | HR 81 | Temp 98.1°F | Resp 18 | Wt 286.2 lb

## 2023-11-04 VITALS — BP 162/78 | HR 76 | Temp 97.5°F | Ht 69.0 in | Wt 275.0 lb

## 2023-11-04 DIAGNOSIS — M47812 Spondylosis without myelopathy or radiculopathy, cervical region: Secondary | ICD-10-CM | POA: Insufficient documentation

## 2023-11-04 DIAGNOSIS — T451X5A Adverse effect of antineoplastic and immunosuppressive drugs, initial encounter: Secondary | ICD-10-CM

## 2023-11-04 DIAGNOSIS — E1142 Type 2 diabetes mellitus with diabetic polyneuropathy: Secondary | ICD-10-CM | POA: Diagnosis not present

## 2023-11-04 DIAGNOSIS — R110 Nausea: Secondary | ICD-10-CM | POA: Diagnosis not present

## 2023-11-04 DIAGNOSIS — G546 Phantom limb syndrome with pain: Secondary | ICD-10-CM | POA: Insufficient documentation

## 2023-11-04 DIAGNOSIS — K521 Toxic gastroenteritis and colitis: Secondary | ICD-10-CM | POA: Diagnosis not present

## 2023-11-04 DIAGNOSIS — M5481 Occipital neuralgia: Secondary | ICD-10-CM | POA: Diagnosis present

## 2023-11-04 DIAGNOSIS — G629 Polyneuropathy, unspecified: Secondary | ICD-10-CM | POA: Diagnosis not present

## 2023-11-04 DIAGNOSIS — C569 Malignant neoplasm of unspecified ovary: Secondary | ICD-10-CM

## 2023-11-04 DIAGNOSIS — Z794 Long term (current) use of insulin: Secondary | ICD-10-CM

## 2023-11-04 DIAGNOSIS — G894 Chronic pain syndrome: Secondary | ICD-10-CM | POA: Insufficient documentation

## 2023-11-04 DIAGNOSIS — G620 Drug-induced polyneuropathy: Secondary | ICD-10-CM | POA: Insufficient documentation

## 2023-11-04 DIAGNOSIS — Z89512 Acquired absence of left leg below knee: Secondary | ICD-10-CM | POA: Diagnosis present

## 2023-11-04 DIAGNOSIS — M5412 Radiculopathy, cervical region: Secondary | ICD-10-CM | POA: Diagnosis present

## 2023-11-04 LAB — CMP (CANCER CENTER ONLY)
ALT: 16 U/L (ref 0–44)
AST: 22 U/L (ref 15–41)
Albumin: 3.6 g/dL (ref 3.5–5.0)
Alkaline Phosphatase: 46 U/L (ref 38–126)
Anion gap: 11 (ref 5–15)
BUN: 30 mg/dL — ABNORMAL HIGH (ref 6–20)
CO2: 24 mmol/L (ref 22–32)
Calcium: 9 mg/dL (ref 8.9–10.3)
Chloride: 102 mmol/L (ref 98–111)
Creatinine: 1.23 mg/dL — ABNORMAL HIGH (ref 0.44–1.00)
GFR, Estimated: 51 mL/min — ABNORMAL LOW (ref >60)
Glucose, Bld: 158 mg/dL — ABNORMAL HIGH (ref 70–99)
Potassium: 4.3 mmol/L (ref 3.5–5.1)
Sodium: 137 mmol/L (ref 135–145)
Total Bilirubin: 0.4 mg/dL (ref 0.0–1.2)
Total Protein: 7 g/dL (ref 6.5–8.1)

## 2023-11-04 LAB — CBC WITH DIFFERENTIAL (CANCER CENTER ONLY)
Abs Immature Granulocytes: 0.11 K/uL — ABNORMAL HIGH (ref 0.00–0.07)
Basophils Absolute: 0.1 K/uL (ref 0.0–0.1)
Basophils Relative: 1 %
Eosinophils Absolute: 0.4 K/uL (ref 0.0–0.5)
Eosinophils Relative: 4 %
HCT: 33.4 % — ABNORMAL LOW (ref 36.0–46.0)
Hemoglobin: 11 g/dL — ABNORMAL LOW (ref 12.0–15.0)
Immature Granulocytes: 1 %
Lymphocytes Relative: 24 %
Lymphs Abs: 2.4 K/uL (ref 0.7–4.0)
MCH: 31.8 pg (ref 26.0–34.0)
MCHC: 32.9 g/dL (ref 30.0–36.0)
MCV: 96.5 fL (ref 80.0–100.0)
Monocytes Absolute: 0.6 K/uL (ref 0.1–1.0)
Monocytes Relative: 6 %
Neutro Abs: 6.5 K/uL (ref 1.7–7.7)
Neutrophils Relative %: 64 %
Platelet Count: 252 K/uL (ref 150–400)
RBC: 3.46 MIL/uL — ABNORMAL LOW (ref 3.87–5.11)
RDW: 15.6 % — ABNORMAL HIGH (ref 11.5–15.5)
WBC Count: 10.2 K/uL (ref 4.0–10.5)
nRBC: 0.2 % (ref 0.0–0.2)

## 2023-11-04 LAB — MAGNESIUM: Magnesium: 1.8 mg/dL (ref 1.7–2.4)

## 2023-11-04 NOTE — Assessment & Plan Note (Addendum)
 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 Post treatment CT showed excellent response. S/p Debulking R1 on 02/20/2023, s/p 4 cycles of adjuvant carboplatin  taxol  Currently on Olaparib  maintenance.  Labs are reviewed and discussed with patient. She did not tolerate Olaparib  300mg  BID due to severe diarrhea.   She tolerated 250mg  twice daily. She takes prophylactic Imodium before each dosage.   Aspirin  81mg  daily for thrombosis prophylaxis. .  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

## 2023-11-04 NOTE — Progress Notes (Signed)
 Safety precautions to be maintained throughout the outpatient stay will include: orient to surroundings, keep bed in low position, maintain call bell within reach at all times, provide assistance with transfer out of bed and ambulation.

## 2023-11-04 NOTE — Telephone Encounter (Signed)
 Patient called stating that she has a procedure on 11/5 with Dr. Marcelino and would like for port to be accessed for procedure. Ok per Dr. Babara.  Appt for port flush scheduled at 11:30am on 11/5. Pt states Dr. Charolotte office will deacess.

## 2023-11-04 NOTE — Assessment & Plan Note (Signed)
 Continue home antiemetics PRN.

## 2023-11-04 NOTE — Patient Instructions (Signed)

## 2023-11-04 NOTE — Progress Notes (Signed)
 Specialty Pharmacy Refill Coordination Note  Nancy [name] is a 58 y.o. female contacted today regarding refills of specialty medication(s) Olaparib  (LYNPARZA )   Patient requested Delivery   Delivery date: 11/07/23   Verified address: [address] [place] [address]   Medication will be filled on: 11/06/23     **Patient did not get shipment on 10/29/23 (see previous refill encounter). She called back and wanted to wait until today's appt to confirm dose change.

## 2023-11-04 NOTE — Assessment & Plan Note (Signed)
 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  Grade 1/ intermittent neuropathy of fingertips and toes.  Continue Gabapentin 300mg  PRN

## 2023-11-04 NOTE — Progress Notes (Signed)
 Hematology/Oncology Progress note [phone] [phone]      CHIEF COMPLAINTS  High grade serous carcinoma of ovaries.   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 Post treatment CT showed excellent response. S/p Debulking R1 on 02/20/2023, s/p 4 cycles of adjuvant carboplatin  taxol  Currently on Olaparib  maintenance.  Labs are reviewed and discussed with patient. She did not tolerate Olaparib  300mg  BID due to severe diarrhea.   She tolerated 250mg  twice daily. She takes prophylactic Imodium before each dosage.   Aspirin  81mg  daily for thrombosis prophylaxis. .  Genetic evaluation - BRIP1 mutation  Chemotherapy induced diarrhea Recommend Imodium as needed.- she takes prophylactic Imdium before each Olarparib.   Chemotherapy-induced nausea Continue home antiemetics PRN.    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  Grade 1 intermittent neuropathy of fingertips and toes.  Continue Gabapentin  300mg  PRN   Follow up 4 weeks   All questions were answered. The patient knows to call the clinic with any problems, questions or concerns.  Zelphia Cap, MD, PhD [facility] Hematology Oncology 11/04/2023    HISTORY OF PRESENTING ILLNESS:  Nancy [name] 58 y.o. female presents to establish care for  peritoneal carcinomatosis I have reviewed her chart and materials related to her cancer extensively and collaborated history with the patient. 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 Aortic atherosclerosis    11/06/2022 Initial Diagnosis   Primary peritoneal carcinomatosis   -presented to emergency room for evaluation of abdominal pain/bloating, poor oral intake, nonbloody nonbilious vomiting  Patient underwent paracentesis, cytology showed malignant cells. IHC can not be added.    11/14/2022 Imaging   CT chest w contrast  1. Prominent subcentimeter right retrocrural lymph nodes, metastatic disease can not be excluded. 2. Nonspecific ground-glass nodule of the superior portion of the right lower lobe measuring 7 mm. Recommend attention on follow-up. 3. Small left pleural effusion. 4. Partially visualized large volume abdominal ascites and peritoneal thickening, volume of ascites appears increased when compared with the prior CT. 5. Coronary artery calcifications and aortic Atherosclerosis (ICD10-I70.0).   11/18/2022 Procedure   Medi port placement by IR   11/18/2022 Procedure   Omentum biopsy showed  1. Omentum, biopsy,  :   - INVOLVEMENT BY HIGH-GRADE SEROUS CARCINOMA OF GYNECOLOGIC ORIGIN.   Diagnosis Note : The carcinoma is positive for cytokeratin 7, PAX8, WT1, and ER.  Cytokeratin 20 and GATA3 are negative.  P53 is overexpressed.  The morphologic findings in conjunction with the pattern of immunohistochemistry support the  above diagnosis.   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 The report date is 11/25/2022.  The CancerNext-Expanded + RNAinsight gene panel offered by [employer] and includes sequencing and rearrangement analysis for the following 71 genes: AIP, ALK, APC*, ATM*, AXIN2, BAP1, BARD1, BMPR1A, BRCA1*, BRCA2*, BRIP1*, CDC73, CDH1*,CDK4, CDKN1B, CDKN2A, CHEK2*, CTNNA1, DICER1, FH, FLCN,  KIF1B, LZTR1, MAX,  MEN1, MET, MLH1*, MSH2*, MSH3, MSH6*, MUTYH*, NF1*, NF2, NTHL1, PALB2*, PHOX2B, PMS2*, POT1, PRKAR1A, PTCH1, PTEN*, RAD51C*, RAD51D*,RB1, RET, SDHA, SDHAF2, SDHB, SDHC, SDHD, SMAD4, SMARCA4, SMARCB1, SMARCE1, STK11, SUFU, TMEM127, TP53*,TSC1, TSC2, VHL; EGFR, EGLN1, HOXB13, KIT, MITF, PDGFRA, POLD1 and POLE (sequencing only); EPCAM and GREM1 (deletion/duplication only).    01/13/2023 Imaging   CT chest abdomen pelvis w contrast showed  Decreasing ascites, trace residual. The areas of omental caking and nodularity along the perineum is decreasing as well with significant residual particularly in the upper anterior abdomen.   Stable small mesenteric and retroperitoneal nodes. Resolved left pleural effusion. No developing new mass lesion. Small left renal angiomyolipoma and upper pole nonobstructing renal stone.   02/20/2023 Surgery    Interval debulking R1 - robotic assisted laparoscopic total hysterectomy, bilateral salpingo-oophorectomy, lysis of adhesions, peritoneal stripping, and omentectomy with stage IIIC high grade serous carcinoma of bilateral ovaries   A. Uterus, bilateral fallopian tubes, and bilateral ovaries, total hysterectomy and bilateral salpingo-oophorectomy: Residual high grade serous carcinoma (1.6 cm) involving bilateral ovaries, left fallopian tube, and uterine serosa.   Additional findings:  Uterus with leiomyomata (up to 1.1 cm) Right fallopian tube with no pathologic diagnosis See synoptic report.   B. Peritoneal nodules, excision: High grade serous carcinoma (up to 0.1 cm).  C. Omentum, omentectomy:  High grade serous carcinoma (0.7 cm). Moderate treatment response (CRS2).     03/04/2023 Cancer Staging   Staging form: Ovary, AJCC 7th Edition - Pathologic stage from 03/04/2023: FIGO Stage IIIC (cM0) - Signed by Babara Call, MD on 03/13/2023 Stage prefix: Initial diagnosis Laterality: Bilateral   History of sepsis secondary to diabetic foot infection/osteomyelitis  status post left BKA done on 09/24/2022,    Today she is accompanied by significant other.  Patient is able to tolerate Olarparib at 250mg  BID dosage.  Manageable diarrhea with prophylactic Imodium prior to each dose of blood.No nausea vomiting.   New diagnosis of macular degeneration and is now getting injections in eyes  She has established care with neurosurgery for neck pain.   MEDICAL HISTORY:  Past Medical History:  Diagnosis Date   Abnormal cardiovascular stress test 09/21/2018   Formatting of this note might be different from the original. Lexiscan Myoview 09/16/2018 revealed mild anterior ischemia   Adverse effect of motion 05/10/2014   AKI (acute kidney injury) 10/22/2022   Anxiety    Arthritis    r knee   Asthma    Breast cyst 05/10/2014   CAD (coronary artery disease)    a.) LHC 06/04/2009 at [facility]; non-obstructive CAD. b.) CTA with FFR 10/08/2018: extensive mixed plaque proximal to mid LAD (51-69%); Coronary Ca score 217; FFR 0.71 dPDA, 0.86 mLAD, 0.87 dLCx.   CCF (congestive cardiac failure) (HCC) 06/06/2008   a.) 30% EF. b.) TTE 06/03/2011: EF >55%; triv MR. c.) TTE 04/27/2018: EF 55%; mild LVH; triv PR, mild MR/TR; G1DD.   Cellulitis of left lower extremity 09/18/2022   Chest pain with high risk for cardiac etiology 07/02/2016   Chronic use of opiate drug for therapeutic purpose 02/13/2022   Formatting of this note might be different from the original. [facility] Pain Contract signed on 04/18/16 & updated 03/04/17; UDS done on 04/18/16.   Complication of anesthesia    Diabetic foot infection (HCC) 03/13/2015   Diabetic foot ulcer (HCC) 02/08/2021   Diabetic ulcer of left heel associated with type 2 diabetes mellitus (HCC) 02/08/2021   Eczema    Family history of adverse  reaction to anesthesia    a.) PONV in mother and grandmother   Gas gangrene (HCC) 09/18/2022   GERD (gastroesophageal reflux disease) 05/10/2014   History of kidney stones    History of MSSA bacteremia due to  osteomyelitis L foot 09/18/22 10/22/2022   HLD (hyperlipidemia)    Hyponatremia 03/13/2015   Migraines    Motion sickness    MSSA bacteremia 09/20/2022   Osteomyelitis (HCC) 09/18/2022   Other acute osteomyelitis, left ankle and foot (HCC) 02/13/2022   Panic attacks    Peripheral edema 04/27/2018   Pneumonia    PONV (postoperative nausea and vomiting)    Sepsis secondary to diabetic foot infection 03/13/2015   Sepsis secondary to diabetic foot infection 03/13/2015   T2DM (type 2 diabetes mellitus) (HCC)    Takotsubo cardiomyopathy / transient apical balooning syndrome / stress-induced cardiomyopathy 05/10/2014   Unspecified essential hypertension     SURGICAL HISTORY: Past Surgical History:  Procedure Laterality Date   AMPUTATION Left 09/19/2022   Procedure: AMPUTATION BELOW KNEE;  Surgeon: Jama Cordella MATSU, MD;  Location: [facility];  Service: Vascular;  Laterality: Left;   AMPUTATION Left 09/24/2022   Procedure: AMPUTATION BELOW KNEE WITH WOUND CLOSURE;  Surgeon: Jama Cordella MATSU, MD;  Location: [facility];  Service: Vascular;  Laterality: Left;   AMPUTATION TOE Left 03/16/2015   Procedure: left fifth toe amputation with incision and drainage;  Surgeon: Eva Gay, DPM;  Location: [facility];  Service: Podiatry;  Laterality: Left;   APPLICATION OF WOUND VAC Left 02/14/2021   Procedure: APPLICATION OF WOUND VAC;  Surgeon: Gay Eva, DPM;  Location: [facility];  Service: Podiatry;  Laterality: Left;   APPLICATION OF WOUND VAC Left 04/16/2021   Procedure: APPLICATION OF WOUND VAC;  Surgeon: Lowery Estefana RAMAN, DO;  Location: [facility];  Service: Plastics;  Laterality: Left;   CARDIAC CATHETERIZATION Left 06/04/2009   Procedure: CARDIAC CATHETERIZATION; Location: [facility]   CHOLECYSTECTOMY     DEBRIDEMENT AND CLOSURE WOUND Left 04/16/2021   Procedure: DEBRIDEMENT AND CLOSURE WOUND;  Surgeon: Lowery Estefana RAMAN, DO;  Location: [facility];  Service: Plastics;  Laterality: Left;  1 hour   IR IMAGING GUIDED PORT INSERTION  11/18/2022   IRRIGATION AND DEBRIDEMENT FOOT Left 02/08/2021   Procedure: IRRIGATION AND DEBRIDEMENT FOOT - LFT HEEL ULCER;  Surgeon: Gay Eva, DPM;  Location: [facility];  Service: Podiatry;  Laterality: Left;   IRRIGATION AND DEBRIDEMENT FOOT Left 02/14/2021   Procedure: IRRIGATION AND DEBRIDEMENT LEFT HEEL;  Surgeon: Gay Eva, DPM;  Location: [facility];  Service: Podiatry;  Laterality: Left;   kidney stone removal     KNEE ARTHROSCOPY W/ MENISCAL REPAIR     NOSE SURGERY  01/07/1989   due to fracture   TEE WITHOUT CARDIOVERSION N/A 09/26/2022   Procedure: TRANSESOPHAGEAL ECHOCARDIOGRAM;  Surgeon: Dewane Shiner, DO;  Location: [facility];  Service: Cardiovascular;  Laterality: N/A;   TOE AMPUTATION     second toe   TONSILLECTOMY     TOTAL KNEE ARTHROPLASTY  01/11/2011   Procedure: TOTAL KNEE ARTHROPLASTY;  Surgeon: Norleen LITTIE Gavel;  Location: [facility];  Service: Orthopedics;  Laterality: Right;  COMPUTER ASSISTED TOTAL KNEE REPLACEMENT    SOCIAL HISTORY: Social History   Socioeconomic History   Marital status: Married    Spouse name: Not on file   Number of children: Not on file   Years of education: Not on file   Highest education level: Not on file  Occupational History   Not on file  Tobacco Use   Smoking status: Former    Current packs/day: 0.00    Types: Cigarettes    Quit date: 03/12/1985    Years since quitting: 38.6   Smokeless tobacco: Never  Vaping Use   Vaping status: Never Used  Substance and Sexual Activity   Alcohol use: No   Drug use: No   Sexual activity: Yes    Birth control/protection: Post-menopausal  Other Topics Concern   Not on file  Social History Narrative   Wife, Barnie, at bedside. One indoor pet, dog.   Social Drivers of Corporate Investment Banker Strain: Not on file  Food Insecurity: No Food Insecurity (10/22/2022)   Hunger Vital Sign    Worried  About Running Out of Food in the Last Year: Never true    Ran Out of Food in the Last Year: Never true  Transportation Needs: No Transportation Needs (10/22/2022)   PRAPARE - Administrator, Civil Service (Medical): No    Lack of Transportation (Non-Medical): No  Physical Activity: Not on file  Stress: Not on file  Social Connections: Not on file  Intimate Partner Violence: Not At Risk (10/22/2022)   Humiliation, Afraid, Rape, and Kick questionnaire    Fear of Current or Ex-Partner: No    Emotionally Abused: No    Physically Abused: No    Sexually Abused: No    FAMILY HISTORY: Family History  Problem Relation Age of Onset   Transient ischemic attack Mother    Asthma Mother    Hypertension Mother    Lung cancer Father 65   Hypertension Father    Asthma Sister    Anxiety disorder Sister    Asthma Sister    Hypertension Brother    Ovarian cancer Maternal Aunt 68   Melanoma Maternal Uncle        dx 87s   Lung cancer Paternal Aunt 89   Stomach cancer Maternal Grandmother    Colon cancer Maternal Grandmother    Lymphoma Maternal Grandmother    Melanoma Maternal Grandfather 72       sun exposure   Lung cancer Paternal Grandmother 85   Kidney cancer Paternal Grandfather        dx early   Crohn's disease Other    GER disease Other    Breast cancer Neg Hx     ALLERGIES:  is allergic to shellfish allergy, codeine, morphine  and codeine, rosuvastatin, percocet [oxycodone -acetaminophen ], sulfa antibiotics, and theophyllines.  MEDICATIONS:  Current Outpatient Medications  Medication Sig Dispense Refill   acidophilus (RISAQUAD) CAPS capsule Take 1 capsule by mouth daily.     albuterol  (PROVENTIL  HFA;VENTOLIN  HFA) 108 (90 Base) MCG/ACT inhaler Inhale 2 puffs into the lungs every 4 (four) hours as needed for wheezing or shortness of breath. Reported on 03/29/2015 1 Inhaler 3   albuterol  (PROVENTIL ) (2.5 MG/3ML) 0.083% nebulizer solution Take 2.5 mg by nebulization as  needed.     ALPRAZolam  (XANAX ) 0.5 MG tablet Take 0.5-1 tablets (0.25-0.5 mg total) by mouth 2 (two) times daily as needed for anxiety. Take 1/2 to 1 tablet by mouth as needed for panic attacks 60 tablet 2   aspirin  EC 81 MG tablet Take 81 mg by mouth daily.     carvedilol  (COREG ) 6.25 MG tablet TAKE 1 TABLET BY MOUTH TWICE DAILY 180 tablet 1   Continuous Glucose Sensor (FREESTYLE LIBRE 2 SENSOR) MISC 1 each by Does not apply route every 14 (fourteen)  days. 2 each 3   desvenlafaxine  (PRISTIQ ) 100 MG 24 hr tablet Take 1 tablet (100 mg total) by mouth daily. 90 tablet 0   dicyclomine  (BENTYL ) 10 MG capsule Take 1 capsule (10 mg total) by mouth 4 (four) times daily -  before meals and at bedtime. 60 capsule 1   diphenoxylate -atropine  (LOMOTIL ) 2.5-0.025 MG tablet Take 1 tablet by mouth 4 (four) times daily as needed for diarrhea or loose stools. 30 tablet 0   Ensure Max Protein (ENSURE MAX PROTEIN) LIQD Take 330 mLs (11 oz total) by mouth 2 (two) times daily.     fenofibrate  54 MG tablet TAKE 1 TABLET BY MOUTH EVERY DAY 30 tablet 11   gabapentin  (NEURONTIN ) 300 MG capsule TAKE 1 CAPSULE BY MOUTH EVERY NIGHT AT BEDTIME 60 capsule 0   HYDROmorphone  (DILAUDID ) 2 MG tablet Take 1 tablet (2 mg total) by mouth every 4 (four) hours as needed for severe pain (pain score 7-10). 60 tablet 0   ibuprofen (ADVIL) 800 MG tablet TAKE 1 TABLET BY MOUTH EVERY 8 HOURS AS NEEDED 90 tablet 3   insulin  NPH-regular Human (70-30) 100 UNIT/ML injection Inject 70 Units into the skin 2 (two) times daily with a meal. 50 mL 10   lidocaine -prilocaine  (EMLA ) cream Apply to affected area once 30 g 3   lisinopril  (ZESTRIL ) 10 MG tablet Take 1 tablet (10 mg total) by mouth every morning AND 0.5 tablets (5 mg total) at bedtime. 45 tablet 11   lisinopril  (ZESTRIL ) 5 MG tablet TAKE 1 TABLET BY MOUTH TWICE DAILY 180 tablet 1   metFORMIN  (GLUCOPHAGE ) 500 MG tablet TAKE 2 TABLET BY MOUTH IN THE MORNING AND 3 TABLET BY MOUTH IN THE EVENING  450 tablet 1   methocarbamol  (ROBAXIN ) 500 MG tablet Take 1 tablet (500 mg total) by mouth every 8 (eight) hours as needed for muscle spasms. 30 tablet 0   mirtazapine  (REMERON ) 7.5 MG tablet TAKE 1 TABLET BY MOUTH EVERY NIGHT AT BEDTIME 90 tablet 2   montelukast  (SINGULAIR ) 10 MG tablet TAKE 1 TABLET BY MOUTH EVERY DAY 90 tablet 1   MOUNJARO  7.5 MG/0.5ML Pen ADMINISTER 7.5 MG UNDER THE SKIN 1 TIME A WEEK 2 mL 0   Multiple Vitamin (MULTIVITAMIN WITH MINERALS) TABS tablet Take 1 tablet by mouth daily. 30 tablet 2   naloxone  (NARCAN ) nasal spray 4 mg/0.1 mL SPRAY 1 SPRAY INTO ONE NOSTRIL AS DIRECTED FOR OPIOID OVERDOSE (TURN PERSON ON SIDE AFTER DOSE. IF NO RESPONSE IN 2-3 MINUTES OR PERSON RESPONDS BUT RELAPSES, REPEAT USING A NEW SPRAY DEVICE AND SPRAY INTO THE OTHER NOSTRIL. CALL 911 AFTER USE.) * EMERGENCY USE ONLY * 1 each 0   nystatin  (MYCOSTATIN /NYSTOP ) powder APPLY TOPICALLY TWICE DAILY AS NEEDED 30 g 3   nystatin  ointment (MYCOSTATIN ) Apply 1 Application topically 2 (two) times daily. 30 g 0   olaparib  (LYNPARZA ) 100 MG tablet Take 1 tablet (100 mg total) by mouth 2 (two) times daily. Swallow whole. May take with food to decrease nausea and vomiting. Take with 150mg  tablet 60 tablet 0   olaparib  (LYNPARZA ) 150 MG tablet Take 1 tablet (150 mg total) by mouth 2 (two) times daily. Swallow whole. May take with food to decrease nausea and vomiting. Take with 100 mg tablet 60 tablet 0   ondansetron  (ZOFRAN ) 8 MG tablet Take 1 tablet (8 mg total) by mouth every 8 (eight) hours as needed for nausea or vomiting. Start on the third day after carboplatin . 30 tablet 1  ondansetron  (ZOFRAN -ODT) 8 MG disintegrating tablet Take 1 tablet (8 mg total) by mouth every 8 (eight) hours as needed for nausea or vomiting. 90 tablet 3   pantoprazole  (PROTONIX ) 40 MG tablet TAKE 1 TABLET BY MOUTH EVERY DAY 90 tablet 3   pravastatin  (PRAVACHOL ) 40 MG tablet TAKE 1 TABLET BY MOUTH EVERY DAY 30 tablet 5   prochlorperazine   (COMPAZINE ) 10 MG tablet Take 1 tablet (10 mg total) by mouth every 6 (six) hours as needed for nausea or vomiting. 90 tablet 1   UBRELVY  100 MG TABS TAKE 1 TABLET (100 MG TOTAL) BY MOUTH AS NEEDED (MIGRAINE). 12 tablet 1   zinc  sulfate 220 (50 Zn) MG capsule Take 1 capsule (220 mg total) by mouth daily. 30 capsule 0   No current facility-administered medications for this visit.    Review of Systems  Constitutional:  Negative for appetite change, chills, fatigue, fever and unexpected weight change.  HENT:   Negative for hearing loss and voice change.   Eyes:  Negative for eye problems.  Respiratory:  Negative for chest tightness, cough and shortness of breath.   Cardiovascular:  Negative for chest pain and leg swelling.  Gastrointestinal:  Positive for diarrhea. Negative for abdominal distention, abdominal pain and blood in stool.  Endocrine: Negative for hot flashes.  Genitourinary:  Negative for difficulty urinating and frequency.   Musculoskeletal:  Positive for neck pain. Negative for arthralgias.       S/p left  BKA   Skin:  Negative for itching and rash.  Neurological:  Positive for numbness. Negative for extremity weakness.  Hematological:  Negative for adenopathy.  Psychiatric/Behavioral:  Negative for confusion.      PHYSICAL EXAMINATION: ECOG PERFORMANCE STATUS: 1 - Symptomatic but completely ambulatory  Vitals:   11/04/23 1300 11/04/23 1310  BP: (!) 168/59 (!) 158/59  Pulse: 81   Resp: 18   Temp: 98.1 F (36.7 C)   SpO2: 92%    Filed Weights   11/04/23 1300  Weight: 286 lb 3.2 oz (129.8 kg)    Physical Exam Constitutional:      General: She is not in acute distress.    Appearance: She is obese. She is not diaphoretic.  HENT:     Head: Normocephalic and atraumatic.     Mouth/Throat:     Pharynx: No oropharyngeal exudate.  Eyes:     General: No scleral icterus. Cardiovascular:     Rate and Rhythm: Normal rate and regular rhythm.  Pulmonary:     Effort:  Pulmonary effort is normal. No respiratory distress.     Breath sounds: Normal breath sounds. No wheezing.  Abdominal:     General: There is no distension.     Palpations: Abdomen is soft.     Tenderness: There is no abdominal tenderness.  Musculoskeletal:        General: Normal range of motion.     Cervical back: Normal range of motion and neck supple.     Comments: S/p left BKA  Skin:    General: Skin is warm and dry.     Findings: No erythema.  Neurological:     Mental Status: She is alert and oriented to person, place, and time. Mental status is at baseline.     Cranial Nerves: No cranial nerve deficit.     Motor: No abnormal muscle tone.  Psychiatric:        Mood and Affect: Affect normal.      LABORATORY DATA:  I have reviewed  the data as listed    Latest Ref Rng & Units 11/04/2023   12:40 PM 10/01/2023    9:45 AM 08/27/2023    1:31 PM  CBC  WBC 4.0 - 10.5 K/uL 10.2  7.7  10.1   Hemoglobin 12.0 - 15.0 g/dL 88.9  88.4  88.0   Hematocrit 36.0 - 46.0 % 33.4  35.5  36.3   Platelets 150 - 400 K/uL 252  237  224       Latest Ref Rng & Units 11/04/2023   12:40 PM 10/01/2023    9:45 AM 08/27/2023    1:30 PM  CMP  Glucose 70 - 99 mg/dL 841  769  827   BUN 6 - 20 mg/dL 30  29  27    Creatinine 0.44 - 1.00 mg/dL 8.76  8.97  8.84   Sodium 135 - 145 mmol/L 137  134  135   Potassium 3.5 - 5.1 mmol/L 4.3  4.7  4.6   Chloride 98 - 111 mmol/L 102  100  102   CO2 22 - 32 mmol/L 24  25  23    Calcium 8.9 - 10.3 mg/dL 9.0  9.3  9.1   Total Protein 6.5 - 8.1 g/dL 7.0  7.4  7.1   Total Bilirubin 0.0 - 1.2 mg/dL 0.4  0.5  0.4   Alkaline Phos 38 - 126 U/L 46  45  45   AST 15 - 41 U/L 22  21  26    ALT 0 - 44 U/L 16  18  22       RADIOGRAPHIC STUDIES: I have personally reviewed the radiological images as listed and agreed with the findings in the report. CT CHEST ABDOMEN PELVIS W CONTRAST Result Date: 10/25/2023 CLINICAL DATA:  Peritoneal carcinomatosis, follow-up. * Tracking Code:  BO * EXAM: CT CHEST, ABDOMEN, AND PELVIS WITH CONTRAST TECHNIQUE: Multidetector CT imaging of the chest, abdomen and pelvis was performed following the standard protocol during bolus administration of intravenous contrast. RADIATION DOSE REDUCTION: This exam was performed according to the departmental dose-optimization program which includes automated exposure control, adjustment of the mA and/or kV according to patient size and/or use of iterative reconstruction technique. CONTRAST:  100mL OMNIPAQUE  IOHEXOL  300 MG/ML  SOLN COMPARISON:  Multiple priors including CT April 11, 2023. FINDINGS: CT CHEST FINDINGS Cardiovascular: Accessed right chest Port-A-Cath with tip near the superior cavoatrial junction. Three-vessel coronary artery calcifications. Calcifications of the mitral annulus. Mediastinum/Nodes: No suspicious thyroid  nodule. No pathologically enlarged mediastinal, hilar or axillary lymph nodes. Lungs/Pleura: No suspicious pulmonary nodules or masses. Faint patchy ground-glass in the left lower lobe for instance on image 85/4. No pleural effusion.  No pneumothorax. Musculoskeletal: No aggressive lytic or blastic lesion of bone. CT ABDOMEN PELVIS FINDINGS Hepatobiliary: No suspicious hepatic lesion. Gallbladder surgically absent. No biliary ductal dilation. Pancreas: No pancreatic ductal dilation or evidence of acute inflammation. Spleen: No splenomegaly. Adrenals/Urinary Tract: No suspicious adrenal nodule/mass. No hydronephrosis. Punctate nonobstructive bilateral renal stones. Urinary bladder is unremarkable for degree of distension. Stomach/Bowel: Stomach is unremarkable for degree of distension. No pathologic dilation of small or large bowel. Noninflamed appendix. No evidence of acute bowel inflammation. Vascular/Lymphatic: Aortic atherosclerosis. No pathologically enlarged abdominal or pelvic lymph nodes. Reproductive: Uterus is surgically absent, without asymmetric nodularity along the vaginal cuff. No  suspicious adnexal mass. Other: Minimal residual mesenteric and omental stranding with peritoneal thickening for instance along the reflections in the pelvis on image 109/2, findings are slightly improved from prior examination. No new suspicious  peritoneal or omental nodularity. Musculoskeletal: No aggressive lytic or blastic lesion of bone. IMPRESSION: 1. Minimal residual mesenteric and omental stranding with peritoneal thickening, slightly improved from prior examination. No new suspicious peritoneal or omental nodularity. 2. No evidence of new or progressive disease in the chest, abdomen or pelvis. 3. Prior hysterectomy, oophorectomy and omentectomy. 4. Faint patchy ground-glass in the left lower lobe, likely infectious or inflammatory. 5. Punctate nonobstructive bilateral renal stones. 6. Aortic atherosclerosis. Aortic Atherosclerosis (ICD10-I70.0). Electronically Signed   By: Reyes Holder M.D.   On: 10/25/2023 08:57

## 2023-11-04 NOTE — Assessment & Plan Note (Signed)
 Recommend Imodium as needed.- she takes prophylactic Imdium before each Olarparib.

## 2023-11-04 NOTE — Patient Instructions (Addendum)
 ______________________________________________________________________    Preparing for your procedure  Appointments: If you think you may not be able to keep your appointment, call 24-48 hours in advance to cancel. We need time to make it available to others.  Procedure visits are for procedures only. During your procedure appointment there will be: NO Prescription Refills*. NO medication changes or discussions*. NO discussion of disability issues*. NO unrelated pain problem evaluations*. NO evaluations to order other pain procedures*. *These will be addressed at a separate and distinct evaluation encounter on the provider's evaluation schedule and not during procedure days.  Instructions: Food intake: Avoid eating anything solid for at least 8 hours prior to your procedure. Clear liquid intake: You may take clear liquids such as water up to 2 hours prior to your procedure. (No carbonated drinks. No soda.) Transportation: Unless otherwise stated by your physician, bring a driver. (Driver cannot be a Market researcher, Pharmacist, community, or any other form of public transportation.) Morning Medicines: Except for blood thinners, take all of your other morning medications with a sip of water. Make sure to take your heart and blood pressure medicines. If your blood pressure's lower number is above 100, the case will be rescheduled. Blood thinners: Make sure to stop your blood thinners as instructed.  If you take a blood thinner, but were not instructed to stop it, call our office [phone] and ask to talk to a nurse. Not stopping a blood thinner prior to certain procedures could lead to serious complications. Diabetics on insulin: Notify the staff so that you can be scheduled 1st case in the morning. If your diabetes requires high dose insulin, take only  of your normal insulin dose the morning of the procedure and notify the staff that you have done so. Preventing infections: Shower with an antibacterial soap the  morning of your procedure.  Build-up your immune system: Take 1000 mg of Vitamin C with every meal (3 times a day) the day prior to your procedure. Antibiotics: Inform the nursing staff if you are taking any antibiotics or if you have any conditions that may require antibiotics prior to procedures. (Example: recent joint implants)   Pregnancy: If you are pregnant make sure to notify the nursing staff. Not doing so may result in injury to the fetus, including death.  Sickness: If you have a cold, fever, or any active infections, call and cancel or reschedule your procedure. Receiving steroids while having an infection may result in complications. Arrival: You must be in the facility at least 30 minutes prior to your scheduled procedure. Tardiness: Your scheduled time is also the cutoff time. If you do not arrive at least 15 minutes prior to your procedure, you will be rescheduled.  Children: Do not bring any children with you. Make arrangements to keep them home. Dress appropriately: There is always a possibility that your clothing may get soiled. Avoid long dresses. Valuables: Do not bring any jewelry or valuables.  Reasons to call and reschedule or cancel your procedure: (Following these recommendations will minimize the risk of a serious complication.) Surgeries: Avoid having procedures within 2 weeks of any surgery. (Avoid for 2 weeks before or after any surgery). Flu Shots: Avoid having procedures within 2 weeks of a flu shots or . (Avoid for 2 weeks before or after immunizations). Barium: Avoid having a procedure within 7-10 days after having had a radiological study involving the use of radiological contrast. (Myelograms, Barium swallow or enema study). Heart attacks: Avoid any elective procedures or surgeries for the  initial 6 months after a "Myocardial Infarction" (Heart Attack). Blood thinners: It is imperative that you stop these medications before procedures. Let us know if you if you take  any blood thinner.  Infection: Avoid procedures during or within two weeks of an infection (including chest colds or gastrointestinal problems). Symptoms associated with infections include: Localized redness, fever, chills, night sweats or profuse sweating, burning sensation when voiding, cough, congestion, stuffiness, runny nose, sore throat, diarrhea, nausea, vomiting, cold or Flu symptoms, recent or current infections. It is specially important if the infection is over the area that we intend to treat. Heart and lung problems: Symptoms that may suggest an active cardiopulmonary problem include: cough, chest pain, breathing difficulties or shortness of breath, dizziness, ankle swelling, uncontrolled high or unusually low blood pressure, and/or palpitations. If you are experiencing any of these symptoms, cancel your procedure and contact your primary care physician for an evaluation.  Remember:  Regular Business hours are:  Monday to Thursday 8:00 AM to 4:00 PM  Provider's Schedule: Delano Metz, MD:  Procedure days: Tuesday and Thursday 7:30 AM to 4:00 PM  Edward Jolly, MD:  Procedure days: Monday and Wednesday 7:30 AM to 4:00 PM Last  Updated: 12/17/2022 ______________________________________________________________________    Facet Joint Block The facet joints connect the bones of the spine (vertebrae). They let you bend, twist, and make other movements with your spine. They also keep you from bending too far, twisting too far, and making other extreme movements. A facet joint block is a procedure where a numbing medicine (local anesthesia) is injected into a facet joint. Many times, a medicine for inflammation (steroid) is also injected. A facet joint block may be done: To diagnose neck or back pain. If the pain gets better after a facet joint block, the pain is likely coming from the facet joint. If the pain does not get better, the pain is likely not coming from the facet joint. To  treat neck or back pain caused by an inflamed facet joint. To help you with physical therapy or other rehab (rehabilitation) exercises. Tell a health care provider about: Any allergies you have. All medicines you are taking, including vitamins, herbs, eye drops, creams, and over-the-counter medicines. Any problems you or family members have had with anesthesia. Any bleeding problems you have. Any surgeries you have had. Any medical conditions you have or have had. Whether you are pregnant or may be pregnant. What are the risks? Your health care provider will talk with you about risks. These may include: Infection. Allergic reactions to medicines or dyes. Bleeding. Injury to a nerve near where the needle was put in (injection site). Pain at the injection site. Short-term weakness or numbness in areas near the nerves at the injection site. What happens before the procedure? When to stop eating and drinking Follow instructions from your health care provider about what you may eat and drink. Medicines Ask your health care provider about: Changing or stopping your regular medicines. These include any diabetes medicines or blood thinners you take. Taking medicines such as aspirin and ibuprofen. These medicines can thin your blood. Do not take these medicines unless your health care provider tells you to. Taking over-the-counter medicines, vitamins, herbs, and supplements. General instructions If you will be going home right after the procedure, plan to have a responsible adult: Take you home from the hospital or clinic. You will not be allowed to drive. Care for you for the time you are told. Ask your health care provider:  How your injection site will be marked. What steps will be taken to help prevent infection. These may include washing skin with a soap that kills germs. What happens during the procedure?  An IV will be inserted into one of your veins. You will lie on your stomach on  an X-ray table. You may be asked to lie in a different position if you will be getting an injection in your neck. Your injection site will be cleaned with a soap that kills germs and then covered with a germ-free (sterile) drape. A local anesthesia will be put in at the injection site. A type of X-ray machine (fluoroscopy) or CT scan will be used to help find your facet joint. A contrast dye may also be injected into your joint to help show if the needle is at the joint. When your provider knows the needle is at your joint, they will inject anesthesia and anti-inflammatory medicine as needed. The needle will be removed. Pressure will be applied to keep your injection site from bleeding. A bandage (dressing) will be placed over each injection site. The procedure may vary among health care providers and hospitals. What happens after the procedure? Your blood pressure, heart rate, breathing rate, and blood oxygen level will be monitored until you leave the hospital or clinic. This information is not intended to replace advice given to you by your health care provider. Make sure you discuss any questions you have with your health care provider. Document Revised: 07/06/2021 Document Reviewed: 07/06/2021 [employer] Patient Education  2024 [employer].

## 2023-11-04 NOTE — Progress Notes (Signed)
 PROVIDER NOTE: Interpretation of information contained herein should be left to medically-trained personnel. Specific patient instructions are provided elsewhere under Patient Instructions section of medical record. This document was created in part using AI and STT-dictation technology, any transcriptional errors that may result from this process are unintentional.  Patient: Nancy [name]  Service: E/M Encounter  Provider: Wallie Sherry, MD  DOB: [birth year]  Delivery: Face-to-face  Specialty: Interventional Pain Management  MRN: [id number]  Setting: Ambulatory outpatient facility  Specialty designation: 09  Type: New Patient  Location: Outpatient office facility  PCP: Orlean Alan HERO, FNP  DOS: 11/04/2023    Referring Prov.: Hilma Hastings, PA-C   Primary Reason(s) for Visit: Encounter for initial evaluation of one or more chronic problems (new to examiner) potentially causing chronic pain, and posing a threat to normal musculoskeletal function. (Level of risk: High) CC: Neck Pain (Neck, left shoulder, back)  HPI  Nancy [name] is a 58 y.o. year old, female patient, who comes for the first time to our practice referred by Hilma Hastings, PA-C for our initial evaluation of her chronic pain. She has Essential hypertension; Allergic rhinitis; Asthma without status asthmaticus; Atherosclerosis of native coronary artery of native heart with angina pectoris; Heart failure (HCC); Clinical depression; Neuropathy, diabetic (HCC); Acid reflux; Mixed hyperlipidemia; Migraine without aura and without status migrainosus, not intractable; Takotsubo cardiomyopathy; Diabetes mellitus, type 2 (HCC); Atherosclerosis of native arteries of the extremities with ulceration (HCC); B12 deficiency due to diet; Vitamin D  deficiency, unspecified; Adjustment disorder with mixed anxiety and depressed mood; Hypotension; Intractable vomiting; S/P left  BKA 09/24/22 secondary to osteomyelitis (below knee amputation) (HCC); Diarrhea;  Obesity, Class III, BMI 40-49.9 (morbid obesity) (HCC); Malignant ascites (HCC); Normocytic anemia; 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); Protein calorie malnutrition; IDA (iron  deficiency anemia); Encounter for antineoplastic chemotherapy; Neoplasm related pain; Genetic testing; BRIP1 positive; Blurring of vision; Chemotherapy-induced nausea; Generalized anxiety disorder; Chemotherapy-induced neuropathy; Neuropathy; Atherosclerosis of native arteries of extremity with intermittent claudication; Chemotherapy induced diarrhea; Hypomagnesemia; Neck pain; Port-A-Cath in place; Chronic obstructive pulmonary disease, unspecified COPD type (HCC); Cervical facet joint syndrome; Cervical radicular pain; Phantom limb syndrome with pain (HCC); and Chronic pain syndrome on their problem list. Today she comes in for evaluation of her Neck Pain (Neck, left shoulder, back)  Pain Assessment: Location: Left Neck Radiating: radiates down both shouldesr to tips of fingers and to lower back; worse on left shoulder Onset: More than a month ago Duration: Chronic pain Quality: Constant, Stabbing Severity: 7 /10 (subjective, self-reported pain score)  Effect on ADL: limits ADLs Timing: Constant Modifying factors: rest, laying down; BP: (!) 162/78  HR: 76  Onset and Duration: Sudden Cause of pain: Surgery Severity: Getting worse, NAS-11 at its worse: 10/10, NAS-11 at its best: 2/10, NAS-11 now: 9/10, and NAS-11 on the average: 7/10 Timing: Not influenced by the time of the day, During activity or exercise, After activity or exercise, and After a period of immobility Aggravating Factors: Motion, Prolonged standing, Twisting, and Walking Alleviating Factors: Resting and Warm showers or baths Associated Problems: Numbness, Tingling, and Pain that wakes patient up Quality of Pain: Aching, Dull, and Getting longer Previous Examinations or Tests: MRI scan and Neurosurgical evaluation Previous  Treatments: Relaxation therapy  Nancy [name] is being evaluated for possible interventional pain management therapies for the treatment of her chronic pain.   Discussed the use of AI scribe software for clinical note transcription with the patient, who gave verbal consent to proceed.  History of Present  Illness   Nancy [name] is a 58 year old female with degenerative disc disease who presents with neck pain following a hysterectomy. She was referred by Harlene Boys for evaluation of her neck pain.  She experiences neck pain that began after a hysterectomy performed on February 13th of this year. The surgery, initially expected to last three hours, extended to seven and a half hours. The pain has worsened over time, initially presenting as a stiff neck. The pain is primarily located in the neck and is more severe on the left side, often leading to headaches and migraines, particularly on the left side. The neck pain is more problematic than the pain radiating into her arms, with the left arm being more affected. She also experiences migraines, which are exacerbated by the neck pain.  She has a history of ovarian cancer diagnosed a year ago, shortly after undergoing a leg amputation due to a severe infection that led to gas gangrene. The infection originated from a blister that developed into a heel bone infection over nearly three years, resulting in the loss of her Achilles tendon and eventually her leg. She underwent chemotherapy, receiving eight rounds instead of the planned six, which has contributed to neuropathy in her hands and remaining leg. She experiences numbness and difficulty with fine motor skills, such as writing and holding utensils.  Her medical history includes diabetes, for which she takes medication, and neuropathy, which affects her hands and legs. She also takes aspirin . She also experiences phantom pain in her amputated leg and takes gabapentin  as needed, though she avoids  frequent use due to side effects. She has a family history of cancer, with multiple relatives, including her father and an aunt, having succumbed to various forms of the disease. She carries a genetic mutation linked to her ovarian cancer, inherited from her mother's side.       Historic Controlled Substance Pharmacotherapy Review  Historical Monitoring: The patient  reports no history of drug use. List of prior UDS Testing: No results found for: MDMA, COCAINSCRNUR, PCPSCRNUR, PCPQUANT, CANNABQUANT, THCU, ETH, CBDTHCR, D8THCCBX, D9THCCBX Historical Background Evaluation: [place] PMP: PDMP not reviewed this encounter. Review of the past 42-months conducted.             10/20/2023 10/20/2023  1 Gabapentin  300 Mg Capsule 60.00 60 Fa Bro [id number] [facility] ([id number]) 0/0  Medicare [place]  09/23/2023 08/02/2023  1 Alprazolam  0.5 Mg Tablet 60.00 30 Am Robina [id number] [facility] [phone]) 1/2 2.00 LME Medicare [place]   [facility] [place] Department of public safety, offender search: Engineer, [employer] Information) Non-contributory Risk Assessment Profile: Aberrant behavior: None observed or detected today Risk factors for fatal opioid overdose: Benzodiazepine use Fatal overdose hazard ratio (HR): Calculation deferred Non-fatal overdose hazard ratio (HR): Calculation deferred Risk of opioid abuse or dependence: 0.7-3.0% with doses <= 36 MME/day and 6.1-26% with doses >= 120 MME/day. Substance use disorder (SUD) risk level: See below Personal History of Substance Abuse (SUD-Substance use disorder):  Alcohol: Negative  Illegal Drugs: Negative  Rx Drugs: Negative  ORT Risk Level calculation: Low Risk  Opioid Risk Tool - 11/04/23 0811       Family History of Substance Abuse   Alcohol Negative    Illegal Drugs Negative    Rx Drugs Negative      Personal History of Substance Abuse   Alcohol Negative    Illegal Drugs Negative    Rx Drugs Negative      Age   Age between 16-45 years  No  Psychological Disease   Psychological  Disease Negative   anxiety and panic attacks   Depression Positive      Total Score   Opioid Risk Tool Scoring 1    Opioid Risk Interpretation Low Risk         ORT Scoring interpretation table:  Score <3 = Low Risk for SUD  Score between 4-7 = Moderate Risk for SUD  Score >8 = High Risk for Opioid Abuse   PHQ-2 Depression Scale:  Total score: 1  PHQ-2 Scoring interpretation table: (Score and probability of major depressive disorder)  Score 0 = No depression  Score 1 = 15.4% Probability  Score 2 = 21.1% Probability  Score 3 = 38.4% Probability  Score 4 = 45.5% Probability  Score 5 = 56.4% Probability  Score 6 = 78.6% Probability   PHQ-9 Depression Scale:  Total score: 1  PHQ-9 Scoring interpretation table:  Score 0-4 = No depression  Score 5-9 = Mild depression  Score 10-14 = Moderate depression  Score 15-19 = Moderately severe depression  Score 20-27 = Severe depression (2.4 times higher risk of SUD and 2.89 times higher risk of overuse)    Meds   Current Outpatient Medications:    acidophilus (RISAQUAD) CAPS capsule, Take 1 capsule by mouth daily., Disp: , Rfl:    albuterol  (PROVENTIL  HFA;VENTOLIN  HFA) 108 (90 Base) MCG/ACT inhaler, Inhale 2 puffs into the lungs every 4 (four) hours as needed for wheezing or shortness of breath. Reported on 03/29/2015, Disp: 1 Inhaler, Rfl: 3   albuterol  (PROVENTIL ) (2.5 MG/3ML) 0.083% nebulizer solution, Take 2.5 mg by nebulization as needed., Disp: , Rfl:    ALPRAZolam  (XANAX ) 0.5 MG tablet, Take 0.5-1 tablets (0.25-0.5 mg total) by mouth 2 (two) times daily as needed for anxiety. Take 1/2 to 1 tablet by mouth as needed for panic attacks, Disp: 60 tablet, Rfl: 2   aspirin  EC 81 MG tablet, Take 81 mg by mouth daily., Disp: , Rfl:    carvedilol  (COREG ) 6.25 MG tablet, TAKE 1 TABLET BY MOUTH TWICE DAILY, Disp: 180 tablet, Rfl: 1   Continuous Glucose Sensor (FREESTYLE LIBRE 2 SENSOR) MISC, 1 each by Does not apply route every 14 (fourteen)  days., Disp: 2 each, Rfl: 3   desvenlafaxine  (PRISTIQ ) 100 MG 24 hr tablet, Take 1 tablet (100 mg total) by mouth daily., Disp: 90 tablet, Rfl: 0   dicyclomine  (BENTYL ) 10 MG capsule, Take 1 capsule (10 mg total) by mouth 4 (four) times daily -  before meals and at bedtime., Disp: 60 capsule, Rfl: 1   diphenoxylate -atropine  (LOMOTIL ) 2.5-0.025 MG tablet, Take 1 tablet by mouth 4 (four) times daily as needed for diarrhea or loose stools., Disp: 30 tablet, Rfl: 0   Ensure Max Protein (ENSURE MAX PROTEIN) LIQD, Take 330 mLs (11 oz total) by mouth 2 (two) times daily., Disp: , Rfl:    fenofibrate  54 MG tablet, TAKE 1 TABLET BY MOUTH EVERY DAY, Disp: 30 tablet, Rfl: 11   gabapentin  (NEURONTIN ) 300 MG capsule, TAKE 1 CAPSULE BY MOUTH EVERY NIGHT AT BEDTIME, Disp: 60 capsule, Rfl: 0   HYDROmorphone  (DILAUDID ) 2 MG tablet, Take 1 tablet (2 mg total) by mouth every 4 (four) hours as needed for severe pain (pain score 7-10)., Disp: 60 tablet, Rfl: 0   ibuprofen (ADVIL) 800 MG tablet, TAKE 1 TABLET BY MOUTH EVERY 8 HOURS AS NEEDED, Disp: 90 tablet, Rfl: 3   insulin  NPH-regular Human (70-30) 100 UNIT/ML injection, Inject 70 Units into the skin  2 (two) times daily with a meal., Disp: 50 mL, Rfl: 10   lidocaine -prilocaine  (EMLA ) cream, Apply to affected area once, Disp: 30 g, Rfl: 3   lisinopril  (ZESTRIL ) 10 MG tablet, Take 1 tablet (10 mg total) by mouth every morning AND 0.5 tablets (5 mg total) at bedtime., Disp: 45 tablet, Rfl: 11   lisinopril  (ZESTRIL ) 5 MG tablet, TAKE 1 TABLET BY MOUTH TWICE DAILY, Disp: 180 tablet, Rfl: 1   metFORMIN  (GLUCOPHAGE ) 500 MG tablet, TAKE 2 TABLET BY MOUTH IN THE MORNING AND 3 TABLET BY MOUTH IN THE EVENING, Disp: 450 tablet, Rfl: 1   methocarbamol  (ROBAXIN ) 500 MG tablet, Take 1 tablet (500 mg total) by mouth every 8 (eight) hours as needed for muscle spasms., Disp: 30 tablet, Rfl: 0   mirtazapine  (REMERON ) 7.5 MG tablet, TAKE 1 TABLET BY MOUTH EVERY NIGHT AT BEDTIME, Disp: 90  tablet, Rfl: 2   montelukast  (SINGULAIR ) 10 MG tablet, TAKE 1 TABLET BY MOUTH EVERY DAY, Disp: 90 tablet, Rfl: 1   MOUNJARO  7.5 MG/0.5ML Pen, ADMINISTER 7.5 MG UNDER THE SKIN 1 TIME A WEEK, Disp: 2 mL, Rfl: 0   Multiple Vitamin (MULTIVITAMIN WITH MINERALS) TABS tablet, Take 1 tablet by mouth daily., Disp: 30 tablet, Rfl: 2   naloxone  (NARCAN ) nasal spray 4 mg/0.1 mL, SPRAY 1 SPRAY INTO ONE NOSTRIL AS DIRECTED FOR OPIOID OVERDOSE (TURN PERSON ON SIDE AFTER DOSE. IF NO RESPONSE IN 2-3 MINUTES OR PERSON RESPONDS BUT RELAPSES, REPEAT USING A NEW SPRAY DEVICE AND SPRAY INTO THE OTHER NOSTRIL. CALL 911 AFTER USE.) * EMERGENCY USE ONLY *, Disp: 1 each, Rfl: 0   nystatin  (MYCOSTATIN /NYSTOP ) powder, APPLY TOPICALLY TWICE DAILY AS NEEDED, Disp: 30 g, Rfl: 3   nystatin  ointment (MYCOSTATIN ), Apply 1 Application topically 2 (two) times daily., Disp: 30 g, Rfl: 0   olaparib  (LYNPARZA ) 100 MG tablet, Take 1 tablet (100 mg total) by mouth 2 (two) times daily. Swallow whole. May take with food to decrease nausea and vomiting. Take with 150mg  tablet, Disp: 60 tablet, Rfl: 0   olaparib  (LYNPARZA ) 150 MG tablet, Take 1 tablet (150 mg total) by mouth 2 (two) times daily. Swallow whole. May take with food to decrease nausea and vomiting. Take with 100 mg tablet, Disp: 60 tablet, Rfl: 0   ondansetron  (ZOFRAN ) 8 MG tablet, Take 1 tablet (8 mg total) by mouth every 8 (eight) hours as needed for nausea or vomiting. Start on the third day after carboplatin ., Disp: 30 tablet, Rfl: 1   ondansetron  (ZOFRAN -ODT) 8 MG disintegrating tablet, Take 1 tablet (8 mg total) by mouth every 8 (eight) hours as needed for nausea or vomiting., Disp: 90 tablet, Rfl: 3   pantoprazole  (PROTONIX ) 40 MG tablet, TAKE 1 TABLET BY MOUTH EVERY DAY, Disp: 90 tablet, Rfl: 3   pravastatin  (PRAVACHOL ) 40 MG tablet, TAKE 1 TABLET BY MOUTH EVERY DAY, Disp: 30 tablet, Rfl: 5   prochlorperazine  (COMPAZINE ) 10 MG tablet, Take 1 tablet (10 mg total) by mouth every  6 (six) hours as needed for nausea or vomiting., Disp: 90 tablet, Rfl: 1   UBRELVY  100 MG TABS, TAKE 1 TABLET (100 MG TOTAL) BY MOUTH AS NEEDED (MIGRAINE)., Disp: 12 tablet, Rfl: 1   zinc  sulfate 220 (50 Zn) MG capsule, Take 1 capsule (220 mg total) by mouth daily., Disp: 30 capsule, Rfl: 0  Imaging Review  Cervical Imaging: Cervical MR wo contrast: No results found for this or any previous visit.  Cervical MR wo contrast: No results found  for this or any previous visit.  Cervical MR w/wo contrast: Results for orders placed during the hospital encounter of 07/21/23  MR Cervical Spine W Wo Contrast  Narrative CLINICAL DATA:  Initial evaluation for neck pain for several months. History of ovarian cancer.  EXAM: MRI CERVICAL SPINE WITHOUT AND WITH CONTRAST  TECHNIQUE: Multiplanar and multiecho pulse sequences of the cervical spine, to include the craniocervical junction and cervicothoracic junction, were obtained without and with intravenous contrast.  CONTRAST:  10 mL of Vueway   COMPARISON:  None available.  FINDINGS: Alignment: Mild levoscoliosis. Mild reversal of the normal cervical lordosis. Trace facet mediated anterolisthesis of C2 on C3.  Vertebrae: Vertebral body height maintained without acute or chronic fracture. Bone marrow signal intensity within normal limits. No discrete or worrisome osseous lesions. Reactive marrow edema and enhancement present about the left C2-3 facet due to facet arthritis (series 104, image 13). No other abnormal marrow edema or enhancement.  Cord: Normal signal and morphology.  No abnormal enhancement.  Posterior Fossa, vertebral arteries, paraspinal tissues: Visualized brain and posterior fossa within normal limits. Craniocervical junction normal. Paraspinous and prevertebral soft tissues within normal limits. Normal intravascular flow voids seen within the vertebral arteries bilaterally.  Disc levels:  C2-C3: Negative interspace.  Moderate left facet hypertrophy with associated reactive marrow edema and trace joint effusion. No spinal stenosis. Foramina remain patent.  C3-C4: Minimal disc bulge with right-sided uncovertebral spurring. No spinal stenosis. Foramina remain adequately patent.  C4-C5: Mild disc bulge with uncovertebral spurring, asymmetric to the right. Flattening of the ventral thecal sac without significant spinal stenosis. Mild right C5 foraminal narrowing. Left neural foramina remains patent.  C5-C6: Mild disc bulge with uncovertebral spurring. No significant spinal stenosis. Mild right C6 foraminal narrowing. Left neural foramina remains patent.  C6-C7: Broad-based right eccentric disc bulge flattens and partially effaces the ventral thecal sac. Mild spinal stenosis. Superimposed uncovertebral spurring with moderate right and mild left C7 foraminal narrowing.  C7-T1:  Negative interspace.  No canal or foraminal stenosis.  Visualized upper thoracic spine demonstrates minor noncompressive disc bulging at T1-2 and T2-3 without significant stenosis.  IMPRESSION: 1. Moderate left-sided facet arthritis at C2-3 with associated reactive marrow edema. Finding could serve as a source for neck pain and referred symptoms. 2. Right eccentric disc bulge with uncovertebral spurring at C6-7, resulting in mild spinal stenosis, with moderate right C7 foraminal narrowing. 3. Mild right C5 and C6 foraminal narrowing related to disc bulge and uncovertebral disease.   Electronically Signed By: Morene Hoard M.D. On: 08/16/2023 03:18  Narrative CLINICAL DATA:  Pain  EXAM: LEFT KNEE - COMPLETE 4+ VIEW  COMPARISON:  None Available.  FINDINGS: Patient is status post below-the-knee amputation. No evidence of fracture, dislocation, or joint effusion. No evidence of arthropathy or other focal bone abnormality. Peripheral vascular calcifications are present. Soft tissues are otherwise within  normal limits.  IMPRESSION: Status post below-the-knee amputation. No acute findings.   Electronically Signed By: Greig Pique M.D. On: 10/01/2022 22:50  Knee-R DG Arthrogram: No results found for this or any previous visit.  Knee-L DG Arthrogram: No results found for this or any previous visit.   Ankle Imaging: Ankle-R DG Complete: No results found for this or any previous visit.  Ankle-L DG Complete: Results for orders placed during the hospital encounter of 02/07/21  DG Ankle Complete Left  Narrative CLINICAL DATA:  Redness/swelling, wound to left ankle, fever  EXAM: LEFT ANKLE COMPLETE - 3+ VIEW  COMPARISON:  None.  FINDINGS: Degenerative changes of the mid foot, chronic.  Soft tissue wound along the posterior aspect of the calcaneus. Associated posterior calcaneal fracture, presumed to reflect a pathologic fracture related to calcaneal osteomyelitis given the appearance.  IMPRESSION: Posterior calcaneal fracture, presumed to reflect a pathologic fracture related to calcaneal osteomyelitis, with overlying soft tissue wound.   Electronically Signed By: Pinkie Pebbles M.D. On: 02/07/2021 22:35   Foot Imaging: Foot-R DG Complete: No results found for this or any previous visit.  Foot-L DG Complete: Results for orders placed during the hospital encounter of 09/18/22  DG Foot Complete Left  Narrative CLINICAL DATA:  Heel ulcer  EXAM: LEFT FOOT - COMPLETE 3+ VIEW  COMPARISON:  02/07/2021 radiographs of the ankle  FINDINGS: Extensive vascular calcifications. Posterior heel ulcer with large amount of gas within the heel pad and posterior heel. Possible cortical bone resorption posterior calcaneal margin on lateral view. Patient is status post partial amputation of the second digit at the level of head of proximal phalanx and transmetatarsal amputation of fifth digit. Degenerative changes at the first MTP joint.  IMPRESSION: Posterior heel ulcer  with large amount of gas within the heel pad and posterior heel consistent with heel ulcer and necrotic infection. Possible cortical resorption/subtle osteomyelitis changes posterior calcaneus deep to the heel ulcer   Electronically Signed By: Luke Bun M.D. On: 09/18/2022 17:17  Complexity Note: Imaging results reviewed.                         ROS  Cardiovascular: High blood pressure and Blood thinners:  Anticoagulant Pulmonary or Respiratory: Wheezing and difficulty taking a deep full breath (Asthma) Neurological: No reported neurological signs or symptoms such as seizures, abnormal skin sensations, urinary and/or fecal incontinence, being born with an abnormal open spine and/or a tethered spinal cord Psychological-Psychiatric: Anxiousness, Depressed, and Suicidal ideations Gastrointestinal: Reflux or heatburn Genitourinary: Kidney disease Hematological: No reported hematological signs or symptoms such as prolonged bleeding, low or poor functioning platelets, bruising or bleeding easily, hereditary bleeding problems, low energy levels due to low hemoglobin or being anemic Endocrine: High blood sugar requiring insulin  (IDDM) Rheumatologic: No reported rheumatological signs and symptoms such as fatigue, joint pain, tenderness, swelling, redness, heat, stiffness, decreased range of motion, with or without associated rash Musculoskeletal: Negative for myasthenia gravis, muscular dystrophy, multiple sclerosis or malignant hyperthermia Work History: Disabled  Allergies  Nancy [name] is allergic to shellfish allergy, codeine, morphine  and codeine, rosuvastatin, percocet [oxycodone -acetaminophen ], sulfa antibiotics, and theophyllines.  Laboratory Chemistry Profile   Renal Lab Results  Component Value Date   BUN 29 (H) 10/01/2023   CREATININE 1.02 (H) 10/01/2023   BCR 27 (H) 08/07/2022   GFRAA 117 11/04/2016   GFRNONAA >60 10/01/2023   SPECGRAV >=1.030 (A) 07/31/2023   PHUR 5.5  07/31/2023   PROTEINUR Positive (A) 07/31/2023     Electrolytes Lab Results  Component Value Date   NA 134 (L) 10/01/2023   K 4.7 10/01/2023   CL 100 10/01/2023   CALCIUM 9.3 10/01/2023   MG 1.8 10/01/2023   PHOS 4.1 10/30/2022     Hepatic Lab Results  Component Value Date   AST 21 10/01/2023   ALT 18 10/01/2023   ALBUMIN 3.7 10/01/2023   ALKPHOS 45 10/01/2023   LIPASE 23 10/22/2022     ID Lab Results  Component Value Date   HIV Non Reactive 09/18/2022   SARSCOV2NAA neg 11/12/2022   STAPHAUREUS NEGATIVE 02/13/2021   MRSAPCR  NEGATIVE 02/13/2021   PREGTESTUR NEGATIVE 03/16/2015     Bone Lab Results  Component Value Date   VD25OH 24.2 (L) 07/10/2023     Endocrine Lab Results  Component Value Date   GLUCOSE 230 (H) 10/01/2023   GLUCOSEU NEGATIVE 10/22/2022   HGBA1C 7.6 (H) 07/10/2023   TSH 2.160 08/07/2022     Neuropathy Lab Results  Component Value Date   VITAMINB12 434 07/10/2023   FOLATE 9.8 09/19/2022   HGBA1C 7.6 (H) 07/10/2023   HIV Non Reactive 09/18/2022     CNS No results found for: COLORCSF, APPEARCSF, RBCCOUNTCSF, WBCCSF, POLYSCSF, LYMPHSCSF, EOSCSF, PROTEINCSF, GLUCCSF, JCVIRUS, CSFOLI, IGGCSF, LABACHR, ACETBL   Inflammation (CRP: Acute  ESR: Chronic) Lab Results  Component Value Date   CRP 47.1 (H) 09/18/2022   ESRSEDRATE 107 (H) 09/18/2022   LATICACIDVEN 1.0 10/22/2022     Rheumatology No results found for: RF, ANA, LABURIC, URICUR, LYMEIGGIGMAB, LYMEABIGMQN, HLAB27   Coagulation Lab Results  Component Value Date   INR 1.2 11/18/2022   LABPROT 15.0 11/18/2022   APTT 51 (H) 09/18/2022   PLT 237 10/01/2023     Cardiovascular Lab Results  Component Value Date   BNP 42.8 10/23/2022   HGB 11.5 (L) 10/01/2023   HCT 35.5 (L) 10/01/2023     Screening Lab Results  Component Value Date   SARSCOV2NAA neg 11/12/2022   STAPHAUREUS NEGATIVE 02/13/2021   MRSAPCR NEGATIVE 02/13/2021   HIV  Non Reactive 09/18/2022   PREGTESTUR NEGATIVE 03/16/2015     Cancer No results found for: CEA, CA125, LABCA2   Allergens No results found for: ALMOND, APPLE, ASPARAGUS, AVOCADO, BANANA, BARLEY, BASIL, BAYLEAF, GREENBEAN, LIMABEAN, WHITEBEAN, BEEFIGE, REDBEET, BLUEBERRY, BROCCOLI, CABBAGE, MELON, CARROT, CASEIN, CASHEWNUT, CAULIFLOWER, CELERY     Note: Lab results reviewed.  PFSH  Drug: Nancy [name]  reports no history of drug use. Alcohol:  reports no history of alcohol use. Tobacco:  reports that she quit smoking about 38 years ago. Her smoking use included cigarettes. She has never used smokeless tobacco. Medical:  has a past medical history of Abnormal cardiovascular stress test (09/21/2018), Adverse effect of motion (05/10/2014), AKI (acute kidney injury) (10/22/2022), Anxiety, Arthritis, Asthma, Breast cyst (05/10/2014), CAD (coronary artery disease), CCF (congestive cardiac failure) (HCC) (06/06/2008), Cellulitis of left lower extremity (09/18/2022), Chest pain with high risk for cardiac etiology (07/02/2016), Chronic use of opiate drug for therapeutic purpose (02/13/2022), Complication of anesthesia, Diabetic foot infection (HCC) (03/13/2015), Diabetic foot ulcer (HCC) (02/08/2021), Diabetic ulcer of left heel associated with type 2 diabetes mellitus (HCC) (02/08/2021), Eczema, Family history of adverse reaction to anesthesia, Gas gangrene (HCC) (09/18/2022), GERD (gastroesophageal reflux disease) (05/10/2014), History of kidney stones, History of MSSA bacteremia due to osteomyelitis L foot 09/18/22 (10/22/2022), HLD (hyperlipidemia), Hyponatremia (03/13/2015), Migraines, Motion sickness, MSSA bacteremia (09/20/2022), Osteomyelitis (HCC) (09/18/2022), Other acute osteomyelitis, left ankle and foot (HCC) (02/13/2022), Panic attacks, Peripheral edema (04/27/2018), Pneumonia, PONV (postoperative nausea and vomiting), Sepsis secondary to diabetic  foot infection (03/13/2015), Sepsis secondary to diabetic foot infection (03/13/2015), T2DM (type 2 diabetes mellitus) (HCC), Takotsubo cardiomyopathy / transient apical balooning syndrome / stress-induced cardiomyopathy (05/10/2014), and Unspecified essential hypertension. Family: family history includes Anxiety disorder in her sister; Asthma in her mother, sister, and sister; Colon cancer in her maternal grandmother; Crohn's disease in an other family member; GER disease in an other family member; Hypertension in her brother, father, and mother; Kidney cancer in her paternal grandfather; Lung cancer (age of onset: 31) in her paternal grandmother; Lung cancer (age  of onset: 49) in her paternal aunt; Lung cancer (age of onset: 29) in her father; Lymphoma in her maternal grandmother; Melanoma in her maternal uncle; Melanoma (age of onset: 62) in her maternal grandfather; Ovarian cancer (age of onset: 71) in her maternal aunt; Stomach cancer in her maternal grandmother; Transient ischemic attack in her mother.  Past Surgical History:  Procedure Laterality Date   AMPUTATION Left 09/19/2022   Procedure: AMPUTATION BELOW KNEE;  Surgeon: Jama Cordella MATSU, MD;  Location: [facility];  Service: Vascular;  Laterality: Left;   AMPUTATION Left 09/24/2022   Procedure: AMPUTATION BELOW KNEE WITH WOUND CLOSURE;  Surgeon: Jama Cordella MATSU, MD;  Location: [facility];  Service: Vascular;  Laterality: Left;   AMPUTATION TOE Left 03/16/2015   Procedure: left fifth toe amputation with incision and drainage;  Surgeon: Eva Gay, DPM;  Location: [facility];  Service: Podiatry;  Laterality: Left;   APPLICATION OF WOUND VAC Left 02/14/2021   Procedure: APPLICATION OF WOUND VAC;  Surgeon: Gay Eva, DPM;  Location: [facility];  Service: Podiatry;  Laterality: Left;   APPLICATION OF WOUND VAC Left 04/16/2021   Procedure: APPLICATION OF WOUND VAC;  Surgeon: Lowery Estefana RAMAN, DO;  Location: [facility];  Service:  Plastics;  Laterality: Left;   CARDIAC CATHETERIZATION Left 06/04/2009   Procedure: CARDIAC CATHETERIZATION; Location: [facility]   CHOLECYSTECTOMY     DEBRIDEMENT AND CLOSURE WOUND Left 04/16/2021   Procedure: DEBRIDEMENT AND CLOSURE WOUND;  Surgeon: Lowery Estefana RAMAN, DO;  Location: [facility];  Service: Plastics;  Laterality: Left;  1 hour   IR IMAGING GUIDED PORT INSERTION  11/18/2022   IRRIGATION AND DEBRIDEMENT FOOT Left 02/08/2021   Procedure: IRRIGATION AND DEBRIDEMENT FOOT - LFT HEEL ULCER;  Surgeon: Gay Eva, DPM;  Location: [facility];  Service: Podiatry;  Laterality: Left;   IRRIGATION AND DEBRIDEMENT FOOT Left 02/14/2021   Procedure: IRRIGATION AND DEBRIDEMENT LEFT HEEL;  Surgeon: Gay Eva, DPM;  Location: [facility];  Service: Podiatry;  Laterality: Left;   kidney stone removal     KNEE ARTHROSCOPY W/ MENISCAL REPAIR     NOSE SURGERY  01/07/1989   due to fracture   TEE WITHOUT CARDIOVERSION N/A 09/26/2022   Procedure: TRANSESOPHAGEAL ECHOCARDIOGRAM;  Surgeon: Dewane Shiner, DO;  Location: [facility];  Service: Cardiovascular;  Laterality: N/A;   TOE AMPUTATION     second toe   TONSILLECTOMY     TOTAL KNEE ARTHROPLASTY  01/11/2011   Procedure: TOTAL KNEE ARTHROPLASTY;  Surgeon: Norleen LITTIE Gavel;  Location: [facility];  Service: Orthopedics;  Laterality: Right;  COMPUTER ASSISTED TOTAL KNEE REPLACEMENT   Active Ambulatory Problems    Diagnosis Date Noted   Essential hypertension 08/09/2008   Allergic rhinitis 05/10/2014   Asthma without status asthmaticus 05/10/2014   Atherosclerosis of native coronary artery of native heart with angina pectoris 05/10/2014   Heart failure (HCC) 06/06/2008   Clinical depression 05/10/2014   Neuropathy, diabetic (HCC) 05/10/2014   Acid reflux 05/10/2014   Mixed hyperlipidemia 05/10/2014   Migraine without aura and without status migrainosus, not intractable 05/10/2014   Takotsubo cardiomyopathy 05/10/2014    Diabetes mellitus, type 2 (HCC) 03/13/2015   Atherosclerosis of native arteries of the extremities with ulceration (HCC) 01/30/2021   B12 deficiency due to diet 05/16/2022   Vitamin D  deficiency, unspecified 05/16/2022   Adjustment disorder with mixed anxiety and depressed mood 09/26/2022   Hypotension 10/22/2022   Intractable vomiting 10/22/2022   S/P left  BKA  09/24/22 secondary to osteomyelitis (below knee amputation) (HCC) 10/22/2022   Diarrhea 10/22/2022   Obesity, Class III, BMI 40-49.9 (morbid obesity) (HCC) 05/10/2014   Malignant ascites (HCC) 10/23/2022   Normocytic anemia 10/28/2022   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   Protein calorie malnutrition 11/06/2022   IDA (iron  deficiency anemia) 11/06/2022   Encounter for antineoplastic chemotherapy 11/21/2022   Neoplasm related pain 11/21/2022   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   Blurring of vision 12/13/2022   Chemotherapy-induced nausea 12/13/2022   Generalized anxiety disorder 02/28/2023   Chemotherapy-induced neuropathy 03/13/2023   Neuropathy 03/13/2023   Atherosclerosis of native arteries of extremity with intermittent claudication 06/04/2023   Chemotherapy induced diarrhea 06/27/2023   Hypomagnesemia 06/27/2023   Neck pain 06/27/2023   Port-A-Cath in place 06/27/2023   Chronic obstructive pulmonary disease, unspecified COPD type (HCC) 07/17/2023   Cervical facet joint syndrome 11/04/2023   Cervical radicular pain 11/04/2023   Phantom limb syndrome with pain (HCC) 11/04/2023   Chronic pain syndrome 11/04/2023   Resolved Ambulatory Problems    Diagnosis Date Noted   Breast cyst 05/10/2014   Adverse effect of motion 05/10/2014   Diabetic foot infection (HCC) 03/13/2015   Sepsis secondary to diabetic foot infection 03/13/2015   Tobacco abuse 03/13/2015   Hyponatremia 03/13/2015   Diabetic ulcer of left heel associated with type 2 diabetes mellitus (HCC)  02/08/2021   Peripheral edema 04/27/2018   Chest pain with high risk for cardiac etiology 07/02/2016   Abnormal cardiovascular stress test 09/21/2018   Chronic use of opiate drug for therapeutic purpose 02/13/2022   Other acute osteomyelitis, left ankle and foot (HCC) 02/13/2022   Diabetic foot infection (HCC) 09/18/2022   Osteomyelitis (HCC) 09/18/2022   Cellulitis of left lower extremity 09/18/2022   Gas gangrene (HCC) 09/18/2022   MSSA bacteremia 09/20/2022   History of MSSA bacteremia due to osteomyelitis L foot 09/18/22 10/22/2022   AKI (acute kidney injury) 10/22/2022   Goals of care, counseling/discussion 11/06/2022   Past Medical History:  Diagnosis Date   Anxiety    Arthritis    Asthma    CAD (coronary artery disease)    CCF (congestive cardiac failure) (HCC) 06/06/2008   Complication of anesthesia    Diabetic foot ulcer (HCC) 02/08/2021   Eczema    Family history of adverse reaction to anesthesia    GERD (gastroesophageal reflux disease) 05/10/2014   History of kidney stones    HLD (hyperlipidemia)    Migraines    Motion sickness    Panic attacks    Pneumonia    PONV (postoperative nausea and vomiting)    T2DM (type 2 diabetes mellitus) (HCC)    Unspecified essential hypertension    Constitutional Exam  General appearance: Well nourished, well developed, and well hydrated. In no apparent acute distress Vitals:   11/04/23 0804  BP: (!) 162/78  Pulse: 76  Temp: (!) 97.5 F (36.4 C)  SpO2: 99%  Weight: 275 lb (124.7 kg)  Height: 5' 9 (1.753 m)   BMI Assessment: Estimated body mass index is 40.61 kg/m as calculated from the following:   Height as of this encounter: 5' 9 (1.753 m).   Weight as of this encounter: 275 lb (124.7 kg).  BMI interpretation table: BMI level Category Range association with higher incidence of chronic pain  <18 kg/m2 Underweight   18.5-24.9 kg/m2 Ideal body weight   25-29.9 kg/m2 Overweight Increased incidence by 20%  30-34.9  kg/m2 Obese (  Class I) Increased incidence by 68%  35-39.9 kg/m2 Severe obesity (Class II) Increased incidence by 136%  >40 kg/m2 Extreme obesity (Class III) Increased incidence by 254%   Patient's current BMI Ideal Body weight  Body mass index is 40.61 kg/m. Ideal body weight: 66.2 kg (145 lb 15.1 oz) Adjusted ideal body weight: 89.6 kg (197 lb 9.1 oz)   BMI Readings from Last 4 Encounters:  11/04/23 40.61 kg/m  10/22/23 41.79 kg/m  10/01/23 42.19 kg/m  09/30/23 42.57 kg/m   Wt Readings from Last 4 Encounters:  11/04/23 275 lb (124.7 kg)  10/22/23 283 lb (128.4 kg)  10/01/23 285 lb 11.2 oz (129.6 kg)  09/30/23 288 lb 4 oz (130.7 kg)    Psych/Mental status: Alert, oriented x 3 (person, place, & time)       Eyes: PERLA Respiratory: No evidence of acute respiratory distress  Cervical Spine Area Exam  Skin & Axial Inspection: No masses, redness, edema, swelling, or associated skin lesions Alignment: Symmetrical Functional ROM: Pain restricted ROM      Stability: No instability detected Muscle Tone/Strength: Functionally intact. No obvious neuro-muscular anomalies detected. Sensory (Neurological): Musculoskeletal pain pattern facet mediated and dermatomal Palpation: Complains of area being tender to palpation in the occipital region            Upper Extremity (UE) Exam    Side: Right upper extremity  Side: Left upper extremity  Skin & Extremity Inspection: Skin color, temperature, and hair growth are WNL. No peripheral edema or cyanosis. No masses, redness, swelling, asymmetry, or associated skin lesions. No contractures.  Skin & Extremity Inspection: Skin color, temperature, and hair growth are WNL. No peripheral edema or cyanosis. No masses, redness, swelling, asymmetry, or associated skin lesions. No contractures.  Functional ROM: Unrestricted ROM          Functional ROM: Unrestricted ROM          Muscle Tone/Strength: Functionally intact. No obvious neuro-muscular anomalies  detected.  Muscle Tone/Strength: Functionally intact. No obvious neuro-muscular anomalies detected.  Sensory (Neurological): Neurogenic pain pattern          Sensory (Neurological): Neurogenic pain pattern          Palpation: No palpable anomalies              Palpation: No palpable anomalies              Provocative Test(s):  Phalen's test: deferred Tinel's test: deferred Apley's scratch test (touch opposite shoulder):  Action 1 (Across chest): Decreased ROM Action 2 (Overhead): Decreased ROM Action 3 (LB reach): Decreased ROM   Provocative Test(s):  Phalen's test: deferred Tinel's test: deferred Apley's scratch test (touch opposite shoulder):  Action 1 (Across chest): Decreased ROM Action 2 (Overhead): Decreased ROM Action 3 (LB reach): Decreased ROM    Thoracic Spine Area Exam  Skin & Axial Inspection: No masses, redness, or swelling Alignment: Symmetrical Functional ROM: Unrestricted ROM Stability: No instability detected Muscle Tone/Strength: Functionally intact. No obvious neuro-muscular anomalies detected. Sensory (Neurological): Unimpaired Muscle strength & Tone: No palpable anomalies Lumbar Spine Area Exam  Skin & Axial Inspection: No masses, redness, or swelling Alignment: Symmetrical Functional ROM: Pain restricted ROM       Stability: No instability detected Muscle Tone/Strength: Functionally intact. No obvious neuro-muscular anomalies detected. Sensory (Neurological): Unimpaired  Gait & Posture Assessment  Ambulation: Limited Gait: Relatively normal for age and body habitus Posture: Difficulty standing up straight, due to pain  Lower Extremity Exam    Side: Right  lower extremity  Side: Left lower extremity  Stability: No instability observed          Stability: No instability observed          Skin & Extremity Inspection: Skin color, temperature, and hair growth are WNL. No peripheral edema or cyanosis. No masses, redness, swelling, asymmetry, or associated skin  lesions. No contractures.  Skin & Extremity Inspection: Below knee amputation (BKA)  Functional ROM: Unrestricted ROM                    Muscle Tone/Strength: Functionally intact. No obvious neuro-muscular anomalies detected.    Sensory (Neurological): Unimpaired        Neurogenic pain pattern  DTR: Patellar: deferred today Achilles: deferred today Plantar: deferred today    Palpation: No palpable anomalies      Assessment  Primary Diagnosis & Pertinent Problem List: The primary encounter diagnosis was Chemotherapy-induced neuropathy. Diagnoses of S/P left  BKA 09/24/22 secondary to osteomyelitis (below knee amputation) (HCC), Cervical facet joint syndrome, Diabetic polyneuropathy associated with type 2 diabetes mellitus (HCC), Cervical radicular pain, Phantom limb syndrome with pain (HCC), Chronic pain syndrome, and Bilateral occipital neuralgia were also pertinent to this visit.  Visit Diagnosis (New problems to examiner): 1. Chemotherapy-induced neuropathy   2. S/P left  BKA 09/24/22 secondary to osteomyelitis (below knee amputation) (HCC)   3. Cervical facet joint syndrome   4. Diabetic polyneuropathy associated with type 2 diabetes mellitus (HCC)   5. Cervical radicular pain   6. Phantom limb syndrome with pain (HCC)   7. Chronic pain syndrome   8. Bilateral occipital neuralgia    Plan of Care (Initial workup plan)  Assessment and Plan    Cervical spondylosis with left-sided C2-C3 involvement and occipital neuralgia   Chronic neck pain is present with moderate left-sided facet arthritis at C2-C3 and reactive marrow edema. Occipital neuralgia likely results from arthritis at C2-C3, causing left-sided pain and headaches. Nancy [name] has a history of greater than 3 months of moderate to severe pain which is resulted in functional impairment.  The patient has tried various conservative therapeutic options such as NSAIDs, Tylenol , muscle relaxants, which was inadequately  effective.  Patient's pain is predominantly axial with physical exam findings suggestive of facet arthropathy. Cervical facet medial branch nerve blocks were discussed with the patient.  Risks and benefits were reviewed.  Patient would like to proceed with bilateral C2, TON, C3 medial branch nerve block.  We also discussed a bilateral diagnostic occipital nerve block for occipital neuralgia   Cervical disc disorder with radiculopathy; right eccentric disc bulge with spurring at C6-C7 resulting in mild spinal canal stenosis, moderate right C7 foraminal narrowing.  Mild right C5 and C6 foraminal narrowing related to disc bulge. Cervical disc bulges are causing nerve compression and pain radiating into the arms, more severe on the left side. Pain management will prioritize neck pain before addressing radiculopathy. Epidural injections are planned to manage radiculopathy after addressing neck pain.  Type 2 diabetes mellitus with diabetic polyneuropathy   Diabetic neuropathy affects both legs and hands, with significant functional impairment in the hands, exacerbated by chemotherapy. She experiences numbness and difficulty with fine motor skills. The potential for spinal cord stimulation for diabetic neuropathy and phantom pain will be discussed.  Phantom limb pain, left lower extremity   Phantom pain is present in the left lower extremity following amputation due to gas gangrene. Gabapentin  is used intermittently due to side effects. The potential for spinal  cord stimulation for phantom pain will be discussed.        Procedure Orders         CERVICAL FACET (MEDIAL BRANCH NERVE BLOCK)          GREATER OCCIPITAL NERVE BLOCK     Pharmacotherapy (current): Medications ordered:  No orders of the defined types were placed in this encounter.  Medications administered during this visit: Nancy [name] had no medications administered during this visit.    Interventional management options: Ms.  Basquez was informed that there is no guarantee that she would be a candidate for interventional therapies. The decision will be based on the results of diagnostic studies, as well as Nancy [name]'s risk profile.  Procedure(s) under consideration:  Cervical facet medial branch nerve block Left occipital nerve block Spinal cord stimulation    Provider-requested follow-up: Return in about 8 days (around 11/12/2023) for Bilateral TON, C3, C4 and Bilateral Occipital nerve block , in clinic IV Versed .  Future Appointments  Date Time Provider Department Center  11/04/2023 12:45 PM [facility] FLUSH [facility] None  11/04/2023  1:00 PM Babara Call, MD [facility] None  11/10/2023  3:00 PM [facility] VASC 1 [facility] None  11/10/2023  4:15 PM Schnier, Cordella MATSU, MD [facility] None  02/04/2024  9:00 AM [facility] GYN ONC [facility] None   I discussed the assessment and treatment plan with the patient. The patient was provided an opportunity to ask questions and all were answered. The patient agreed with the plan and demonstrated an understanding of the instructions.  Patient advised to call back or seek an in-person evaluation if the symptoms or condition worsens.  I personally spent a total of 60 minutes in the care of the patient today including preparing to see the patient, getting/reviewing separately obtained history, performing a medically appropriate exam/evaluation, counseling and educating, placing orders, and documenting clinical information in the EHR.   Note by: Wallie Sherry, MD (TTS and AI technology used. I apologize for any typographical errors that were not detected and corrected.) Date: 11/04/2023; Time: 8:41 AM

## 2023-11-05 ENCOUNTER — Other Ambulatory Visit: Payer: Self-pay

## 2023-11-05 ENCOUNTER — Ambulatory Visit

## 2023-11-05 LAB — CA 125: Cancer Antigen (CA) 125: 5.7 U/mL (ref 0.0–38.1)

## 2023-11-06 ENCOUNTER — Other Ambulatory Visit: Payer: Self-pay

## 2023-11-06 ENCOUNTER — Encounter: Payer: Self-pay | Admitting: Oncology

## 2023-11-06 ENCOUNTER — Ambulatory Visit: Admitting: Family

## 2023-11-06 ENCOUNTER — Other Ambulatory Visit (HOSPITAL_COMMUNITY): Payer: Self-pay

## 2023-11-06 ENCOUNTER — Encounter: Payer: Self-pay | Admitting: Family

## 2023-11-06 VITALS — BP 118/70 | HR 75 | Ht 69.0 in | Wt 287.4 lb

## 2023-11-06 DIAGNOSIS — I10 Essential (primary) hypertension: Secondary | ICD-10-CM | POA: Diagnosis not present

## 2023-11-06 DIAGNOSIS — Z794 Long term (current) use of insulin: Secondary | ICD-10-CM

## 2023-11-06 DIAGNOSIS — F411 Generalized anxiety disorder: Secondary | ICD-10-CM

## 2023-11-06 DIAGNOSIS — Z23 Encounter for immunization: Secondary | ICD-10-CM

## 2023-11-06 DIAGNOSIS — E66813 Obesity, class 3: Secondary | ICD-10-CM | POA: Diagnosis not present

## 2023-11-06 DIAGNOSIS — E782 Mixed hyperlipidemia: Secondary | ICD-10-CM

## 2023-11-06 DIAGNOSIS — E538 Deficiency of other specified B group vitamins: Secondary | ICD-10-CM

## 2023-11-06 DIAGNOSIS — E559 Vitamin D deficiency, unspecified: Secondary | ICD-10-CM | POA: Diagnosis not present

## 2023-11-06 DIAGNOSIS — E1121 Type 2 diabetes mellitus with diabetic nephropathy: Secondary | ICD-10-CM

## 2023-11-06 DIAGNOSIS — E1165 Type 2 diabetes mellitus with hyperglycemia: Secondary | ICD-10-CM | POA: Diagnosis not present

## 2023-11-06 LAB — POC CREATINE & ALBUMIN,URINE
Creatinine, POC: 200 mg/dL
Microalbumin Ur, POC: 80 mg/L

## 2023-11-06 MED ORDER — UBRELVY 100 MG PO TABS: 100.0000 mg | ORAL_TABLET | ORAL | 1 refills | Status: AC | PRN

## 2023-11-06 MED ORDER — MOUNJARO 7.5 MG/0.5ML ~~LOC~~ SOAJ: 7.5000 mg | SUBCUTANEOUS | 4 refills | Status: DC

## 2023-11-06 MED ORDER — DESVENLAFAXINE SUCCINATE ER 100 MG PO TB24: 100.0000 mg | ORAL_TABLET | Freq: Every day | ORAL | 3 refills | Status: AC

## 2023-11-06 MED ORDER — MONTELUKAST SODIUM 10 MG PO TABS: 10.0000 mg | ORAL_TABLET | Freq: Every day | ORAL | 1 refills | Status: DC

## 2023-11-06 NOTE — Progress Notes (Signed)
 Medication is on order, patient has been notified of delay via voice mail. New delivery date is 11/10/23

## 2023-11-07 ENCOUNTER — Other Ambulatory Visit: Payer: Self-pay

## 2023-11-10 ENCOUNTER — Ambulatory Visit (INDEPENDENT_AMBULATORY_CARE_PROVIDER_SITE_OTHER)

## 2023-11-10 ENCOUNTER — Ambulatory Visit (INDEPENDENT_AMBULATORY_CARE_PROVIDER_SITE_OTHER): Payer: Self-pay | Admitting: Vascular Surgery

## 2023-11-10 ENCOUNTER — Encounter: Payer: Self-pay | Admitting: Oncology

## 2023-11-10 ENCOUNTER — Encounter (INDEPENDENT_AMBULATORY_CARE_PROVIDER_SITE_OTHER): Payer: Self-pay | Admitting: Vascular Surgery

## 2023-11-10 VITALS — BP 161/76 | HR 73 | Resp 18 | Ht 69.0 in | Wt 285.0 lb

## 2023-11-10 DIAGNOSIS — I25119 Atherosclerotic heart disease of native coronary artery with unspecified angina pectoris: Secondary | ICD-10-CM

## 2023-11-10 DIAGNOSIS — I70219 Atherosclerosis of native arteries of extremities with intermittent claudication, unspecified extremity: Secondary | ICD-10-CM

## 2023-11-10 DIAGNOSIS — I10 Essential (primary) hypertension: Secondary | ICD-10-CM

## 2023-11-10 DIAGNOSIS — J45909 Unspecified asthma, uncomplicated: Secondary | ICD-10-CM

## 2023-11-10 DIAGNOSIS — Z89512 Acquired absence of left leg below knee: Secondary | ICD-10-CM

## 2023-11-10 NOTE — Progress Notes (Addendum)
 Is there anybody we should be voting for                                                                      MRN : [id number]  Nancy [name] is a 58 y.o. (05/28/1965) female who presents with chief complaint of check circulation.  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  She has minimal phantom pain but overall is doing very well seem very motivated.   She is also followed regarding atherosclerotic changes of the right lower extremity and review of the noninvasive studies.    There have been no interval changes in lower extremity symptoms. No interval shortening of the patient's claudication distance or development of rest pain symptoms. No new ulcers or wounds have occurred since the last visit.   There have been no significant changes to the patient's overall health care.   The patient denies amaurosis fugax or recent TIA symptoms. There are no documented recent neurological changes noted. There is no history of DVT, PE or superficial thrombophlebitis. The patient denies recent episodes of angina or shortness of breath.    ABI's done today Rt=[place] TBI=0.90 triphasic signal and Lt=BKA  No outpatient medications have been marked as taking for the 11/10/23 encounter (Appointment) with Jama, Cordella MATSU, MD.    Past Medical History:  Diagnosis Date   Abnormal cardiovascular stress test 09/21/2018   Formatting of this note might be different from the original. Lexiscan Myoview 09/16/2018 revealed mild anterior ischemia   Adverse effect of motion 05/10/2014   AKI (acute kidney injury) 10/22/2022   Anxiety    Arthritis    r knee   Asthma    Breast cyst 05/10/2014   CAD (coronary artery disease)    a.) LHC 06/04/2009 at [facility]; non-obstructive CAD.  b.) CTA with FFR 10/08/2018: extensive mixed plaque proximal to mid LAD (51-69%); Coronary Ca score 217; FFR 0.71 dPDA, 0.86 mLAD, 0.87 dLCx.   CCF (congestive cardiac failure) (HCC) 06/06/2008   a.) 30% EF. b.) TTE 06/03/2011: EF >55%; triv MR. c.) TTE 04/27/2018: EF 55%; mild LVH; triv PR, mild MR/TR; G1DD.   Cellulitis of left lower extremity 09/18/2022   Chest pain with high risk for cardiac etiology 07/02/2016   Chronic use of opiate drug for therapeutic purpose 02/13/2022   Formatting of this note might be different from the original. [facility] Pain Contract signed on 04/18/16 & updated 03/04/17; UDS done on 04/18/16.   Complication of anesthesia    Diabetic foot infection (HCC) 03/13/2015   Diabetic foot ulcer (HCC) 02/08/2021   Diabetic ulcer of left heel associated with type 2 diabetes mellitus (HCC) 02/08/2021   Eczema    Family history of adverse reaction to anesthesia    a.) PONV in mother and grandmother   Gas gangrene (HCC) 09/18/2022   GERD (gastroesophageal reflux disease) 05/10/2014   History of kidney stones    History of MSSA bacteremia due to osteomyelitis L foot 09/18/22 10/22/2022   HLD (  hyperlipidemia)    Hyponatremia 03/13/2015   Migraines    Motion sickness    MSSA bacteremia 09/20/2022   Osteomyelitis (HCC) 09/18/2022   Other acute osteomyelitis, left ankle and foot (HCC) 02/13/2022   Panic attacks    Peripheral edema 04/27/2018   Pneumonia    PONV (postoperative nausea and vomiting)    Sepsis secondary to diabetic foot infection 03/13/2015   Sepsis secondary to diabetic foot infection 03/13/2015   T2DM (type 2 diabetes mellitus) (HCC)    Takotsubo cardiomyopathy / transient apical balooning syndrome / stress-induced cardiomyopathy 05/10/2014   Unspecified essential hypertension     Past Surgical History:  Procedure Laterality Date   AMPUTATION Left 09/19/2022   Procedure: AMPUTATION BELOW KNEE;  Surgeon: Jama Cordella MATSU, MD;  Location: [facility];  Service:  Vascular;  Laterality: Left;   AMPUTATION Left 09/24/2022   Procedure: AMPUTATION BELOW KNEE WITH WOUND CLOSURE;  Surgeon: Jama Cordella MATSU, MD;  Location: [facility];  Service: Vascular;  Laterality: Left;   AMPUTATION TOE Left 03/16/2015   Procedure: left fifth toe amputation with incision and drainage;  Surgeon: Eva Gay, DPM;  Location: [facility];  Service: Podiatry;  Laterality: Left;   APPLICATION OF WOUND VAC Left 02/14/2021   Procedure: APPLICATION OF WOUND VAC;  Surgeon: Gay Eva, DPM;  Location: [facility];  Service: Podiatry;  Laterality: Left;   APPLICATION OF WOUND VAC Left 04/16/2021   Procedure: APPLICATION OF WOUND VAC;  Surgeon: Lowery Estefana RAMAN, DO;  Location: [facility];  Service: Plastics;  Laterality: Left;   CARDIAC CATHETERIZATION Left 06/04/2009   Procedure: CARDIAC CATHETERIZATION; Location: [facility]   CHOLECYSTECTOMY     DEBRIDEMENT AND CLOSURE WOUND Left 04/16/2021   Procedure: DEBRIDEMENT AND CLOSURE WOUND;  Surgeon: Lowery Estefana RAMAN, DO;  Location: [facility];  Service: Plastics;  Laterality: Left;  1 hour   IR IMAGING GUIDED PORT INSERTION  11/18/2022   IRRIGATION AND DEBRIDEMENT FOOT Left 02/08/2021   Procedure: IRRIGATION AND DEBRIDEMENT FOOT - LFT HEEL ULCER;  Surgeon: Gay Eva, DPM;  Location: [facility];  Service: Podiatry;  Laterality: Left;   IRRIGATION AND DEBRIDEMENT FOOT Left 02/14/2021   Procedure: IRRIGATION AND DEBRIDEMENT LEFT HEEL;  Surgeon: Gay Eva, DPM;  Location: [facility];  Service: Podiatry;  Laterality: Left;   kidney stone removal     KNEE ARTHROSCOPY W/ MENISCAL REPAIR     NOSE SURGERY  01/07/1989   due to fracture   TEE WITHOUT CARDIOVERSION N/A 09/26/2022   Procedure: TRANSESOPHAGEAL ECHOCARDIOGRAM;  Surgeon: Dewane Shiner, DO;  Location: [facility];  Service: Cardiovascular;  Laterality: N/A;   TOE AMPUTATION     second toe   TONSILLECTOMY     TOTAL KNEE ARTHROPLASTY   01/11/2011   Procedure: TOTAL KNEE ARTHROPLASTY;  Surgeon: Norleen LITTIE Gavel;  Location: [facility];  Service: Orthopedics;  Laterality: Right;  COMPUTER ASSISTED TOTAL KNEE REPLACEMENT    Social History Social History   Tobacco Use   Smoking status: Former    Current packs/day: 0.00    Types: Cigarettes    Quit date: 03/12/1985    Years since quitting: 38.6   Smokeless tobacco: Never  Vaping Use   Vaping status: Never Used  Substance Use Topics   Alcohol use: No   Drug use: No    Family History Family History  Problem Relation Age of Onset   Transient ischemic attack Mother    Asthma Mother    Hypertension Mother  Lung cancer Father 8   Hypertension Father    Asthma Sister    Anxiety disorder Sister    Asthma Sister    Hypertension Brother    Ovarian cancer Maternal Aunt 51   Melanoma Maternal Uncle        dx 76s   Lung cancer Paternal Aunt 64   Stomach cancer Maternal Grandmother    Colon cancer Maternal Grandmother    Lymphoma Maternal Grandmother    Melanoma Maternal Grandfather 72       sun exposure   Lung cancer Paternal Grandmother 81   Kidney cancer Paternal Grandfather        dx early   Crohn's disease Other    GER disease Other    Breast cancer Neg Hx     Allergies  Allergen Reactions   Shellfish Allergy Anaphylaxis   Codeine Other (See Comments)    Migraine  Other Reaction(s): Other (See Comments)  Reaction:  Severe migraines   Morphine  And Codeine Other (See Comments)    Reaction:  Severe migraines    Rosuvastatin     Other reaction(s): Muscle Pain   Metformin  And Related Diarrhea   Percocet [Oxycodone -Acetaminophen ] Nausea And Vomiting   Sulfa Antibiotics Rash   Theophyllines Itching and Rash     REVIEW OF SYSTEMS (Negative unless checked)  Constitutional: [] Weight loss  [] Fever  [] Chills Cardiac: [] Chest pain   [] Chest pressure   [] Palpitations   [] Shortness of breath when laying flat   [] Shortness of breath with exertion. Vascular:   [x] Pain in legs with walking   [] Pain in legs at rest  [] History of DVT   [] Phlebitis   [] Swelling in legs   [] Varicose veins   [] Non-healing ulcers Pulmonary:   [] Uses home oxygen    [] Productive cough   [] Hemoptysis   [] Wheeze  [] COPD   [] Asthma Neurologic:  [] Dizziness   [] Seizures   [] History of stroke   [] History of TIA  [] Aphasia   [] Vissual changes   [] Weakness or numbness in arm   [] Weakness or numbness in leg Musculoskeletal:   [] Joint swelling   [] Joint pain   [] Low back pain Hematologic:  [] Easy bruising  [] Easy bleeding   [] Hypercoagulable state   [] Anemic Gastrointestinal:  [] Diarrhea   [] Vomiting  [] Gastroesophageal reflux/heartburn   [] Difficulty swallowing. Genitourinary:  [] Chronic kidney disease   [] Difficult urination  [] Frequent urination   [] Blood in urine Skin:  [] Rashes   [] Ulcers  Psychological:  [] History of anxiety   []  History of major depression.  Physical Examination  There were no vitals filed for this visit. There is no height or weight on file to calculate BMI. Gen: WD/WN, NAD Head: [place]/AT, No temporalis wasting.  Ear/Nose/Throat: Hearing grossly intact, nares w/o erythema or drainage Eyes: PER, EOMI, sclera nonicteric.  Neck: Supple, no masses.  No bruit or JVD.  Pulmonary:  Good air movement, no audible wheezing, no use of accessory muscles.  Cardiac: RRR, normal S1, S2, no Murmurs. Vascular:  mild trophic changes, no open wounds Vessel Right Left  Radial Palpable Palpable  PT Not Palpable BKA  DP Not Palpable BKA  Gastrointestinal: soft, non-distended. No guarding/no peritoneal signs.  Musculoskeletal: M/S 5/5 throughout.  No visible deformity.  Neurologic: CN 2-12 intact. Pain and light touch intact in extremities.  Symmetrical.  Speech is fluent. Motor exam as listed above. Psychiatric: Judgment intact, Mood & affect appropriate for pt's clinical situation. Dermatologic: No rashes or ulcers noted.  No changes consistent with cellulitis.   CBC Lab  Results  Component Value Date   WBC 10.2 11/04/2023   HGB 11.0 (L) 11/04/2023   HCT 33.4 (L) 11/04/2023   MCV 96.5 11/04/2023   PLT 252 11/04/2023    BMET    Component Value Date/Time   NA 137 11/04/2023 1240   NA 132 (L) 08/07/2022 1210   NA 133 (L) 08/21/2012 1523   K 4.3 11/04/2023 1240   K 4.1 08/21/2012 1523   CL 102 11/04/2023 1240   CL 102 08/21/2012 1523   CO2 24 11/04/2023 1240   CO2 26 08/21/2012 1523   GLUCOSE 158 (H) 11/04/2023 1240   GLUCOSE 306 (H) 08/21/2012 1523   BUN 30 (H) 11/04/2023 1240   BUN 23 08/07/2022 1210   BUN 17 08/21/2012 1523   CREATININE 1.23 (H) 11/04/2023 1240   CREATININE 0.69 08/21/2012 1523   CALCIUM 9.0 11/04/2023 1240   CALCIUM 8.8 08/21/2012 1523   GFRNONAA 51 (L) 11/04/2023 1240   GFRNONAA >60 08/21/2012 1523   GFRAA 117 11/04/2016 1109   GFRAA >60 08/21/2012 1523   Estimated Creatinine Clearance: 72.3 mL/min (A) (by C-G formula based on SCr of 1.23 mg/dL (H)).  COAG Lab Results  Component Value Date   INR 1.2 11/18/2022   INR 1.5 (H) 09/18/2022   INR 1.2 02/07/2021    Radiology CT CHEST ABDOMEN PELVIS W CONTRAST Result Date: 10/25/2023 CLINICAL DATA:  Peritoneal carcinomatosis, follow-up. * Tracking Code: BO * EXAM: CT CHEST, ABDOMEN, AND PELVIS WITH CONTRAST TECHNIQUE: Multidetector CT imaging of the chest, abdomen and pelvis was performed following the standard protocol during bolus administration of intravenous contrast. RADIATION DOSE REDUCTION: This exam was performed according to the departmental dose-optimization program which includes automated exposure control, adjustment of the mA and/or kV according to patient size and/or use of iterative reconstruction technique. CONTRAST:  OMNIPAQUE  IOHEXOL  300 MG/ML  SOLN COMPARISON:  Multiple priors including CT April 11, 2023. FINDINGS: CT CHEST FINDINGS Cardiovascular: Accessed right chest Port-A-Cath with tip near the superior cavoatrial junction. Three-vessel coronary  artery calcifications. Calcifications of the mitral annulus. Mediastinum/Nodes: No suspicious thyroid  nodule. No pathologically enlarged mediastinal, hilar or axillary lymph nodes. Lungs/Pleura: No suspicious pulmonary nodules or masses. Faint patchy ground-glass in the left lower lobe for instance on image 85/4. No pleural effusion.  No pneumothorax. Musculoskeletal: No aggressive lytic or blastic lesion of bone. CT ABDOMEN PELVIS FINDINGS Hepatobiliary: No suspicious hepatic lesion. Gallbladder surgically absent. No biliary ductal dilation. Pancreas: No pancreatic ductal dilation or evidence of acute inflammation. Spleen: No splenomegaly. Adrenals/Urinary Tract: No suspicious adrenal nodule/mass. No hydronephrosis. Punctate nonobstructive bilateral renal stones. Urinary bladder is unremarkable for degree of distension. Stomach/Bowel: Stomach is unremarkable for degree of distension. No pathologic dilation of small or large bowel. Noninflamed appendix. No evidence of acute bowel inflammation. Vascular/Lymphatic: Aortic atherosclerosis. No pathologically enlarged abdominal or pelvic lymph nodes. Reproductive: Uterus is surgically absent, without asymmetric nodularity along the vaginal cuff. No suspicious adnexal mass. Other: Minimal residual mesenteric and omental stranding with peritoneal thickening for instance along the reflections in the pelvis on image 109/2, findings are slightly improved from prior examination. No new suspicious peritoneal or omental nodularity. Musculoskeletal: No aggressive lytic or blastic lesion of bone. IMPRESSION: 1. Minimal residual mesenteric and omental stranding with peritoneal thickening, slightly improved from prior examination. No new suspicious peritoneal or omental nodularity. 2. No evidence of new or progressive disease in the chest, abdomen or pelvis. 3. Prior hysterectomy, oophorectomy and omentectomy. 4. Faint patchy ground-glass in the left lower  lobe, likely infectious or  inflammatory. 5. Punctate nonobstructive bilateral renal stones. 6. Aortic atherosclerosis. Aortic Atherosclerosis (ICD10-I70.0). Electronically Signed   By: Reyes Holder M.D.   On: 10/25/2023 08:57     Assessment/Plan 1. Atherosclerosis of native artery of lower extremity with intermittent claudication, unspecified laterality (Primary)  Recommend:  The patient has evidence of atherosclerosis of the lower extremities with claudication.  The patient does not voice lifestyle limiting changes at this point in time.  Noninvasive studies do not suggest clinically significant change.  No invasive studies, angiography or surgery at this time The patient should continue walking and begin a more formal exercise program.  The patient should continue antiplatelet therapy and aggressive treatment of the lipid abnormalities  No changes in the patient's medications at this time  Continued surveillance is indicated as atherosclerosis is likely to progress with time.    The patient will continue follow up with noninvasive studies as ordered.  - VAS US  ABI WITH/WO TBI; Future  2. S/P left  BKA 09/24/22 secondary to osteomyelitis (below knee amputation) [facility]) The patient has a well-healed left below-knee amputation site.  She is very motivated to continue her independence and activities of daily living.  She has demonstrated excellent potential with her therapy and excellent strength.  The patient has transition to a K3 ambulator and would functionally benefit from a hydraulic ankle in order to maintain her activity level and independence  3. Essential hypertension Continue antihypertensive medications as already ordered, these medications have been reviewed and there are no changes at this time.  4. Atherosclerosis of native coronary artery of native heart with angina pectoris Continue cardiac and antihypertensive medications as already ordered and reviewed, no changes at this time.  Continue statin  as ordered and reviewed, no changes at this time  Nitrates PRN for chest pain  5. Asthma without status asthmaticus without complication, unspecified asthma severity, unspecified whether persistent Continue pulmonary medications and aerosols as already ordered, these medications have been reviewed and there are no changes at this time.     Cordella Shawl, MD  11/10/2023 12:47 PM

## 2023-11-11 ENCOUNTER — Other Ambulatory Visit: Payer: Self-pay | Admitting: Family

## 2023-11-12 ENCOUNTER — Encounter: Payer: Self-pay | Admitting: Oncology

## 2023-11-12 ENCOUNTER — Encounter (INDEPENDENT_AMBULATORY_CARE_PROVIDER_SITE_OTHER): Payer: Self-pay | Admitting: Vascular Surgery

## 2023-11-12 ENCOUNTER — Inpatient Hospital Stay: Attending: Oncology

## 2023-11-12 ENCOUNTER — Other Ambulatory Visit: Payer: Self-pay | Admitting: Family

## 2023-11-12 ENCOUNTER — Ambulatory Visit: Admitting: Student in an Organized Health Care Education/Training Program

## 2023-11-12 MED ORDER — PANTOPRAZOLE SODIUM 40 MG PO TBEC: 40.0000 mg | DELAYED_RELEASE_TABLET | Freq: Every day | ORAL | 3 refills | Status: AC

## 2023-11-14 LAB — VAS US ABI WITH/WO TBI

## 2023-11-20 ENCOUNTER — Other Ambulatory Visit: Payer: Self-pay | Admitting: Family

## 2023-11-26 ENCOUNTER — Other Ambulatory Visit: Payer: Self-pay

## 2023-11-26 ENCOUNTER — Ambulatory Visit (HOSPITAL_BASED_OUTPATIENT_CLINIC_OR_DEPARTMENT_OTHER): Admitting: Student in an Organized Health Care Education/Training Program

## 2023-11-26 ENCOUNTER — Encounter: Payer: Self-pay | Admitting: Student in an Organized Health Care Education/Training Program

## 2023-11-26 ENCOUNTER — Ambulatory Visit
Admission: RE | Admit: 2023-11-26 | Discharge: 2023-11-26 | Disposition: A | Discharge status: Home / Self Care | Source: Ambulatory Visit | Attending: Student in an Organized Health Care Education/Training Program | Admitting: Student in an Organized Health Care Education/Training Program

## 2023-11-26 ENCOUNTER — Inpatient Hospital Stay: Attending: Oncology

## 2023-11-26 ENCOUNTER — Other Ambulatory Visit (HOSPITAL_COMMUNITY): Payer: Self-pay

## 2023-11-26 ENCOUNTER — Other Ambulatory Visit: Payer: Self-pay | Admitting: Oncology

## 2023-11-26 VITALS — BP 140/57 | HR 77 | Temp 98.1°F | Resp 16 | Ht 69.0 in | Wt 288.0 lb

## 2023-11-26 DIAGNOSIS — G894 Chronic pain syndrome: Secondary | ICD-10-CM

## 2023-11-26 DIAGNOSIS — M5481 Occipital neuralgia: Secondary | ICD-10-CM | POA: Diagnosis present

## 2023-11-26 DIAGNOSIS — M47812 Spondylosis without myelopathy or radiculopathy, cervical region: Secondary | ICD-10-CM

## 2023-11-26 DIAGNOSIS — Z452 Encounter for adjustment and management of vascular access device: Secondary | ICD-10-CM | POA: Insufficient documentation

## 2023-11-26 DIAGNOSIS — C569 Malignant neoplasm of unspecified ovary: Secondary | ICD-10-CM | POA: Diagnosis present

## 2023-11-26 MED ORDER — LIDOCAINE HCL (PF) 2 % IJ SOLN
INTRAMUSCULAR | Status: AC
  Filled 2023-11-26: qty 10

## 2023-11-26 MED ORDER — MIDAZOLAM HCL 2 MG/2ML IJ SOLN
INTRAMUSCULAR | Status: AC
  Filled 2023-11-26: qty 2

## 2023-11-26 MED ORDER — ROPIVACAINE HCL 2 MG/ML IJ SOLN
9.0000 mL | Freq: Once | INTRAMUSCULAR | Status: AC
  Administered 2023-11-26: 20 mL via PERINEURAL

## 2023-11-26 MED ORDER — LACTATED RINGERS IV SOLN: Freq: Once | INTRAVENOUS | Status: AC

## 2023-11-26 MED ORDER — DEXAMETHASONE SOD PHOSPHATE PF 10 MG/ML IJ SOLN
10.0000 mg | Freq: Once | INTRAMUSCULAR | Status: AC
  Administered 2023-11-26: 10 mg

## 2023-11-26 MED ORDER — HEPARIN SOD (PORK) LOCK FLUSH 100 UNIT/ML IV SOLN
500.0000 [IU] | Freq: Every day | INTRAVENOUS | Status: DC
  Administered 2023-11-26: 500 [IU]
  Filled 2023-11-26 (×2): qty 5

## 2023-11-26 MED ORDER — OLAPARIB 150 MG PO TABS
150.0000 mg | ORAL_TABLET | Freq: Two times a day (BID) | ORAL | 0 refills | Status: AC
  Filled 2023-11-26 – 2023-11-27 (×2): qty 60, 30d supply, fill #0

## 2023-11-26 MED ORDER — OLAPARIB 100 MG PO TABS
100.0000 mg | ORAL_TABLET | Freq: Two times a day (BID) | ORAL | 0 refills | Status: AC
  Filled 2023-11-26 – 2023-11-27 (×2): qty 60, 30d supply, fill #0

## 2023-11-26 MED ORDER — LIDOCAINE HCL 2 % IJ SOLN
20.0000 mL | Freq: Once | INTRAMUSCULAR | Status: AC
  Administered 2023-11-26: 100 mg

## 2023-11-26 MED ORDER — MIDAZOLAM HCL (PF) 2 MG/2ML IJ SOLN
0.5000 mg | Freq: Once | INTRAMUSCULAR | Status: AC
  Administered 2023-11-26: 2 mg via INTRAVENOUS

## 2023-11-26 MED ORDER — DEXAMETHASONE SOD PHOSPHATE PF 10 MG/ML IJ SOLN
20.0000 mg | Freq: Once | INTRAMUSCULAR | Status: AC
  Administered 2023-11-26: 10 mg

## 2023-11-26 MED ORDER — SODIUM CHLORIDE (PF) 0.9 % IJ SOLN
INTRAMUSCULAR | Status: AC
  Filled 2023-11-26: qty 10

## 2023-11-26 MED ORDER — ROPIVACAINE HCL 2 MG/ML IJ SOLN
INTRAMUSCULAR | Status: AC
  Filled 2023-11-26: qty 20

## 2023-11-26 NOTE — Patient Instructions (Addendum)
 ______________________________________________________________________    Post-Procedure Discharge Instructions  INSTRUCTIONS Apply ice:  Purpose: This will minimize any swelling and discomfort after procedure.  When: Day of procedure, as soon as you get home. How: Fill a plastic sandwich bag with crushed ice. Cover it with a small towel and apply to injection site. How long: (15 min on, 15 min off) Apply for 15 minutes then remove x 15 minutes.  Repeat sequence on day of procedure, until you go to bed. Apply heat:  Purpose: To treat any soreness and discomfort from the procedure. When: Starting the next day after the procedure. How: Apply heat to procedure site starting the day following the procedure. How long: May continue to repeat daily, until discomfort goes away. Food intake: Start with clear liquids (like water ) and advance to regular food, as tolerated.  Physical activities: Keep activities to a minimum for the first 8 hours after the procedure. After that, then as tolerated. Driving: If you have received any sedation, be responsible and do not drive. You are not allowed to drive for 24 hours after having sedation. Blood thinner: (Applies only to those taking blood thinners) You may restart your blood thinner 6 hours after your procedure. Insulin : (Applies only to Diabetic patients taking insulin ) As soon as you can eat, you may resume your normal dosing schedule. Infection prevention: Keep procedure site clean and dry. Shower daily and clean area with soap and water .  PAIN DIARY Post-procedure Pain Diary: Extremely important that this be done correctly and accurately. Recorded information will be used to determine the next step in treatment. For the purpose of accuracy, follow these rules: Evaluate only the area treated. Do not report or include pain from an untreated area. For the purpose of this evaluation, ignore all other areas of pain, except for the treated area. After your  procedure, avoid taking a long nap and attempting to complete the pain diary after you wake up. Instead, set your alarm clock to go off every hour, on the hour, for the initial 8 hours after the procedure. Document the duration of the numbing medicine, and the relief you are getting from it. Do not go to sleep and attempt to complete it later. It will not be accurate. If you received sedation, it is likely that you were given a medication that may cause amnesia. Because of this, completing the diary at a later time may cause the information to be inaccurate. This information is needed to plan your care. Follow-up appointment: Keep your post-procedure follow-up evaluation appointment after the procedure (usually 2 weeks for most procedures, 6 weeks for radiofrequencies). DO NOT FORGET to bring you pain diary with you.   EXPECT... (What should I expect to see with my procedure?) From numbing medicine (AKA: Local Anesthetics): Numbness or decrease in pain. You may also experience some weakness, which if present, could last for the duration of the local anesthetic. Onset: Full effect within 15 minutes of injected. Duration: It will depend on the type of local anesthetic used. On the average, 1 to 8 hours.  From steroids (Applies only if steroids were used): Decrease in swelling or inflammation. Once inflammation is improved, relief of the pain will follow. Onset of benefits: Depends on the amount of swelling present. The more swelling, the longer it will take for the benefits to be seen. In some cases, up to 10 days. Duration: Steroids will stay in the system x 2 weeks. Duration of benefits will depend on multiple posibilities including persistent irritating  factors. Side-effects: If present, they may typically last 2 weeks (the duration of the steroids). Frequent: Cramps (if they occur, drink Gatorade and take over-the-counter Magnesium  450-500 mg once to twice a day); water  retention with temporary weight  gain; increases in blood sugar; decreased immune system response; increased appetite. Occasional: Facial flushing (red, warm cheeks); mood swings; menstrual changes. Uncommon: Long-term decrease or suppression of natural hormones; bone thinning. (These are more common with higher doses or more frequent use. This is why we prefer that our patients avoid having any injection therapies in other practices.)  Very Rare: Severe mood changes; psychosis; aseptic necrosis. From procedure: Some discomfort is to be expected once the numbing medicine wears off. This should be minimal if ice and heat are applied as instructed.  CALL IF... (When should I call?) You experience numbness and weakness that gets worse with time, as opposed to wearing off. New onset bowel or bladder incontinence. (Applies only to procedures done in the spine)  Emergency Numbers: Durning business hours (Monday - Thursday, 8:00 AM - 4:00 PM) (Friday, 9:00 AM - 12:00 Noon): [phone] After hours: [phone] NOTE: If you are having a problem and are unable connect with, or to talk to a provider, then go to your nearest urgent care or emergency department. If the problem is serious and urgent, please call 911. ______________________________________________________________________    PAIN DIARY Post-procedure Pain Diary: Extremely important that this be done correctly and accurately. Recorded information will be used to determine the next step in treatment. For the purpose of accuracy, follow these rules: Evaluate only the area treated. Do not report or include pain from an untreated area. For the purpose of this evaluation, ignore all other areas of pain, except for the treated area. After your procedure, avoid taking a long nap and attempting to complete the pain diary after you wake up. Instead, set your alarm clock to go off every hour, on the hour, for the initial 8 hours after the procedure. Document the duration of the  numbing medicine, and the relief you are getting from it. Do not go to sleep and attempt to complete it later. It will not be accurate. If you received sedation, it is likely that you were given a medication that may cause amnesia. Because of this, completing the diary at a later time may cause the information to be inaccurate. This information is needed to plan your care. Follow-up appointment: Keep your post-procedure follow-up evaluation appointment after the procedure (usually 2 weeks for most procedures, 6 weeks for radiofrequencies). DO NOT FORGET to bring you pain diary with you.   EXPECT... (What should I expect to see with my procedure?) From numbing medicine (AKA: Local Anesthetics): Numbness or decrease in pain. You may also experience some weakness, which if present, could last for the duration of the local anesthetic. Onset: Full effect within 15 minutes of injected. Duration: It will depend on the type of local anesthetic used. On the average, 1 to 8 hours.  From steroids (Applies only if steroids were used): Decrease in swelling or inflammation. Once inflammation is improved, relief of the pain will follow. Onset of benefits: Depends on the amount of swelling present. The more swelling, the longer it will take for the benefits to be seen. In some cases, up to 10 days. Duration: Steroids will stay in the system x 2 weeks. Duration of benefits will depend on multiple posibilities including persistent irritating factors. Side-effects: If present, they may typically last 2 weeks (  the duration of the steroids). Frequent: Cramps (if they occur, drink Gatorade and take over-the-counter Magnesium  450-500 mg once to twice a day); water  retention with temporary weight gain; increases in blood sugar; decreased immune system response; increased appetite. Occasional: Facial flushing (red, warm cheeks); mood swings; menstrual changes. Uncommon: Long-term decrease or suppression of natural hormones;  bone thinning. (These are more common with higher doses or more frequent use. This is why we prefer that our patients avoid having any injection therapies in other practices.)  Very Rare: Severe mood changes; psychosis; aseptic necrosis. From procedure: Some discomfort is to be expected once the numbing medicine wears off. This should be minimal if ice and heat are applied as instructed.  CALL IF... (When should I call?) You experience numbness and weakness that gets worse with time, as opposed to wearing off. New onset bowel or bladder incontinence. (Applies only to procedures done in the spine)  Emergency Numbers: Durning business hours (Monday - Thursday, 8:00 AM - 4:00 PM) (Friday, 9:00 AM - 12:00 Noon): [phone] After hours: [phone] NOTE: If you are having a problem and are unable connect with, or to talk to a provider, then go to your nearest urgent care or emergency department. If the problem is serious and urgent, please call 911. ______________________________________________________________________

## 2023-11-26 NOTE — Progress Notes (Signed)
 Port flushed with 10 cc normal saline prior to use.  Upon completion of the procedure, port flushed with 10 cc NS, then 5 cc Heparin , 500 units.  Needle removed.

## 2023-11-26 NOTE — Progress Notes (Signed)
 PROVIDER NOTE: Interpretation of information contained herein should be left to medically-trained personnel. Specific patient instructions are provided elsewhere under Patient Instructions section of medical record. This document was created in part using STT-dictation technology, any transcriptional errors that may result from this process are unintentional.  Patient: Nancy [name] Type: Established DOB: 01/22/1965 MRN: [id number] PCP: Orlean Alan HERO, FNP  Service: Procedure DOS: 11/26/2023 Setting: Ambulatory Location: Ambulatory outpatient facility Delivery: Face-to-face Provider: Wallie Sherry, MD Specialty: Interventional Pain Management Specialty designation: 09 Location: Outpatient facility Ref. Prov.: Sherry Wallie, MD       Interventional Therapy   Procedure: Cervical Facet Medial Branch Block(s) #1  Laterality: Bilateral  Level: TON, C3, and C4 Medial Branch Level(s). Injecting these levels blocks the C2-3 and C3-4 cervical facet joints.  Imaging: Fluoroscopic guidance Anesthesia: Local anesthesia (1-2% Lidocaine ) Sedation: Minimal Sedation                       DOS: 11/26/2023  Performed by: Wallie Sherry, MD  Purpose: Diagnostic/Therapeutic Indications: Cervicalgia (cervical spine axial pain) severe enough to impact quality of life or function. 1. Cervical facet joint syndrome   2. Chronic pain syndrome    NAS-11 Pain score:   Pre-procedure: 6 /10   Post-procedure: 6 /10     Position / Prep / Materials:  Position: Prone. Head in cradle. C-spine slightly flexed. Prep solution: ChloraPrep (2% chlorhexidine  gluconate and 70% isopropyl alcohol) Prep Area: Posterior Cervico-thoracic Region. From occipital ridge to tip of scapula, and from shoulder to shoulder. Entire posterior and lateral neck surface. Materials:  Tray: Block Needle(s):  Type: Spinal  Gauge (G): 22  Length: 3.5-in  Qty:  2     H&P (Pre-op Assessment):  Nancy [name] is a 58 y.o. (year  old), female patient, seen today for interventional treatment. She  has a past surgical history that includes Cholecystectomy; kidney stone removal; Tonsillectomy; Cardiac catheterization (Left, 06/04/2009); Knee arthroscopy w/ meniscal repair; Total knee arthroplasty (01/11/2011); Nose surgery (01/07/1989); Toe amputation; Amputation toe (Left, 03/16/2015); Irrigation and debridement foot (Left, 02/08/2021); Irrigation and debridement foot (Left, 02/14/2021); Application if wound vac (Left, 02/14/2021); Debridement and closure wound (Left, 04/16/2021); Application if wound vac (Left, 04/16/2021); Amputation (Left, 09/19/2022); Amputation (Left, 09/24/2022); TEE without cardioversion (N/A, 09/26/2022); and IR IMAGING GUIDED PORT INSERTION (11/18/2022). Nancy [name] has a current medication list which includes the following prescription(s): acidophilus, albuterol , albuterol , alprazolam , aspirin  ec, carvedilol , desvenlafaxine , ensure max protein, fenofibrate , gabapentin , insulin  nph-regular human, lisinopril , lisinopril , montelukast , multivitamin with minerals, nystatin , olaparib , olaparib , ondansetron , pantoprazole , pravastatin , prochlorperazine , mounjaro , ubrelvy , freestyle libre 2 sensor, dicyclomine , diphenoxylate -atropine , hydromorphone , ibuprofen, lidocaine -prilocaine , metformin , methocarbamol , mirtazapine , naloxone , nystatin  ointment, ondansetron , and zinc  sulfate (50mg  elemental zinc ), and the following Facility-Administered Medications: heparin  lock flush and lactated ringers . Nancy [name] primarily concern today is the Neck Pain (Worse on the left )  Initial Vital Signs:  Pulse/HCG Rate: 77ECG Heart Rate: 78 Temp: 98.1 F (36.7 C) Resp: 16 BP: (!) 169/65 SpO2: 100 %  BMI: Estimated body mass index is 42.53 kg/m as calculated from the following:   Height as of this encounter: 5' 9 (1.753 m).   Weight as of this encounter: 288 lb (130.6 kg).  Risk Assessment: Allergies: Reviewed. She is allergic to shellfish  allergy, codeine, morphine  and codeine, rosuvastatin, metformin  and related, percocet [oxycodone -acetaminophen ], sulfa antibiotics, and theophyllines.  Allergy Precautions: None required Coagulopathies: Reviewed. None identified.  Blood-thinner therapy: None at this time Active Infection(s): Reviewed. None identified. Nancy [name] is afebrile  Site Confirmation: Nancy [name] was asked to confirm the procedure and laterality before marking the site Procedure checklist: Completed Consent: Before the procedure and under the influence of no sedative(s), amnesic(s), or anxiolytics, the patient was informed of the treatment options, risks and possible complications. To fulfill our ethical and legal obligations, as recommended by the American Medical Association's Code of Ethics, I have informed the patient of my clinical impression; the nature and purpose of the treatment or procedure; the risks, benefits, and possible complications of the intervention; the alternatives, including doing nothing; the risk(s) and benefit(s) of the alternative treatment(s) or procedure(s); and the risk(s) and benefit(s) of doing nothing. The patient was provided information about the general risks and possible complications associated with the procedure. These may include, but are not limited to: failure to achieve desired goals, infection, bleeding, organ or nerve damage, allergic reactions, paralysis, and death. In addition, the patient was informed of those risks and complications associated to Spine-related procedures, such as failure to decrease pain; infection (i.e.: Meningitis, epidural or intraspinal abscess); bleeding (i.e.: epidural hematoma, subarachnoid hemorrhage, or any other type of intraspinal or peri-dural bleeding); organ or nerve damage (i.e.: Any type of peripheral nerve, nerve root, or spinal cord injury) with subsequent damage to sensory, motor, and/or autonomic systems, resulting in permanent pain,  numbness, and/or weakness of one or several areas of the body; allergic reactions; (i.e.: anaphylactic reaction); and/or death. Furthermore, the patient was informed of those risks and complications associated with the medications. These include, but are not limited to: allergic reactions (i.e.: anaphylactic or anaphylactoid reaction(s)); adrenal axis suppression; blood sugar elevation that in diabetics may result in ketoacidosis or comma; water  retention that in patients with history of congestive heart failure may result in shortness of breath, pulmonary edema, and decompensation with resultant heart failure; weight gain; swelling or edema; medication-induced neural toxicity; particulate matter embolism and blood vessel occlusion with resultant organ, and/or nervous system infarction; and/or aseptic necrosis of one or more joints. Finally, the patient was informed that Medicine is not an exact science; therefore, there is also the possibility of unforeseen or unpredictable risks and/or possible complications that may result in a catastrophic outcome. The patient indicated having understood very clearly. We have given the patient no guarantees and we have made no promises. Enough time was given to the patient to ask questions, all of which were answered to the patient's satisfaction. Ms. Matthies has indicated that she wanted to continue with the procedure. Attestation: I, the ordering provider, attest that I have discussed with the patient the benefits, risks, side-effects, alternatives, likelihood of achieving goals, and potential problems during recovery for the procedure that I have provided informed consent. Date  Time: 11/26/2023  9:11 AM  Pre-Procedure Preparation:  Monitoring: As per clinic protocol. Respiration, ETCO2, SpO2, BP, heart rate and rhythm monitor placed and checked for adequate function Safety Precautions: Patient was assessed for positional comfort and pressure points before starting  the procedure. Time-out: I initiated and conducted the Time-out before starting the procedure, as per protocol. The patient was asked to participate by confirming the accuracy of the Time Out information. Verification of the correct person, site, and procedure were performed and confirmed by me, the nursing staff, and the patient. Time-out conducted as per Joint Commission's Universal Protocol (UP.01.01.01). Time: 1029 Start Time: 1029 hrs.  Description/Narrative of Procedure:          Laterality: See above. Targeted Levels: See above.  Rationale (medical necessity): procedure needed and proper  for the diagnosis and/or treatment of the patient's medical symptoms and needs. Procedural Technique Safety Precautions: Aspiration looking for blood return was conducted prior to all injections. At no point did we inject any substances, as a needle was being advanced. No attempts were made at seeking any paresthesias. Safe injection practices and needle disposal techniques used. Medications properly checked for expiration dates. SDV (single dose vial) medications used. Description of the Procedure: Protocol guidelines were followed. The patient was assisted into a comfortable position. The target area was identified and the area prepped in the usual manner. Skin & deeper tissues infiltrated with local anesthetic. Appropriate amount of time allowed to pass for local anesthetics to take effect. The procedure needles were then advanced to the target area. Proper needle placement secured. Negative aspiration confirmed. Solution injected in intermittent fashion, asking for systemic symptoms every 0.5cc of injectate. The needles were then removed and the area cleansed, making sure to leave some of the prepping solution back to take advantage of its long term bactericidal properties.  Technical description of process:   Third Occipital Nerve (TON) Block (MBB): The target area for the TON branch is the  postero-lateral aspect of the C2-C3 articulation. Under fluoroscopic guidance, a Quincke needle was inserted until contact was made with os over the target area. After negative aspiration for blood, 2 mL of the nerve block solution was injected without difficulty or complication. The needle was removed intact. C3 Medial Branch Nerve Block (MBB): The target area for the C3 dorsal medial articular branch is the lateral concave waist of the articular pillar of C3. Under fluoroscopic guidance, a Quincke needle was inserted until contact was made with os over the postero-lateral aspect of the articular pillar of C3 (target area). After negative aspiration for blood, 2mL of the nerve block solution was injected without difficulty or complication. The needle was removed intact. C4 Medial Branch Nerve Block (MBB): The target area for the C4 dorsal medial articular branch is the lateral concave waist of the articular pillar of C4. Under fluoroscopic guidance, a Quincke needle was inserted until contact was made with os over the postero-lateral aspect of the articular pillar of C4 (target area). After negative aspiration for blood, 2mL of the nerve block solution was injected without difficulty or complication. The needle was removed intact.   Once the entire procedure was completed, the treated area was cleaned, making sure to leave some of the prepping solution back to take advantage of its long term bactericidal properties.  Anatomy Reference Guide:      Facet Joint Innervation  C1-2 Third occipital Nerve (TON)  C2-3 TON, C3  Medial Branch  C3-4 C3, C4                     C4-5 C4, C5                     C5-6 C5, C6                     C6-7 C6, C7                     C7-T1 C7, C8                      Cervical Facet Pain Pattern overlap:   Vitals:   11/26/23 1025 11/26/23 1030 11/26/23 1035 11/26/23 1050  BP: (!) 165/77 (!) 176/87 (!) 156/75 (!) 140/57  Pulse:      Resp: 11 11 16    Temp:       TempSrc:      SpO2: 99% 100% 100% 100%  Weight:      Height:         Start Time: 1029 hrs. End Time: 1038 hrs.  Imaging Guidance (Spinal):         Type of Imaging Technique: Fluoroscopy Guidance (Spinal) Indication(s): Fluoroscopy guidance for needle placement to enhance accuracy in procedures requiring precise needle localization for targeted delivery of medication in or near specific anatomical locations not easily accessible without such real-time imaging assistance. Exposure Time: Please see nurses notes. Contrast: None used. Fluoroscopic Guidance: I was personally present during the use of fluoroscopy. Tunnel Vision Technique used to obtain the best possible view of the target area. Parallax error corrected before commencing the procedure. Direction-depth-direction technique used to introduce the needle under continuous pulsed fluoroscopy. Once target was reached, antero-posterior, oblique, and lateral fluoroscopic projection used confirm needle placement in all planes. Images permanently stored in EMR. Interpretation: No contrast injected. I personally interpreted the imaging intraoperatively. Adequate needle placement confirmed in multiple planes. Permanent images saved into the patient's record.  Post-operative Assessment:  Post-procedure Vital Signs:  Pulse/HCG Rate: 7775 Temp: 98.1 F (36.7 C) Resp: 16 BP: (!) 140/57 SpO2: 100 %  EBL: None  Complications: No immediate post-treatment complications observed by team, or reported by patient.  Note: The patient tolerated the entire procedure well. A repeat set of vitals were taken after the procedure and the patient was kept under observation following institutional policy, for this type of procedure. Post-procedural neurological assessment was performed, showing return to baseline, prior to discharge. The patient was provided with post-procedure discharge instructions, including a section on how to identify potential  problems. Should any problems arise concerning this procedure, the patient was given instructions to immediately contact us , at any time, without hesitation. In any case, we plan to contact the patient by telephone for a follow-up status report regarding this interventional procedure.  Comments:  No additional relevant information.  Plan of Care (POC)  Orders:  Orders Placed This Encounter  Procedures   DG PAIN CLINIC C-ARM 1-60 MIN NO REPORT    Intraoperative interpretation by procedural physician at [facility].    Standing Status:   Standing    Number of Occurrences:   1    Reason for exam::   Assistance in needle guidance and placement for procedures requiring needle placement in or near specific anatomical locations not easily accessible without such assistance.     Medications ordered for procedure: Meds ordered this encounter  Medications   heparin  lock flush 100 unit/mL   lidocaine  (XYLOCAINE ) 2 % (with pres) injection 400 mg   lactated ringers  infusion   midazolam  PF (VERSED ) injection 0.5-2 mg    Make sure Flumazenil is available in the pyxis when using this medication. If oversedation occurs, administer 0.2 mg IV over 15 sec. If after 45 sec no response, administer 0.2 mg again over 1 min; may repeat at 1 min intervals; not to exceed 4 doses (1 mg)   dexamethasone  (DECADRON ) injection 20 mg   dexamethasone  (DECADRON ) injection 10 mg   ropivacaine  (PF) 2 mg/mL (0.2%) (NAROPIN ) injection 9 mL   Medications administered: We administered lidocaine , lactated ringers , midazolam  PF, dexamethasone , dexamethasone , and ropivacaine  (PF) 2 mg/mL (0.2%).  See the medical record for exact dosing, route, and time of administration.    TON, C3, C4  MBNB and GONB 11/26/23    Follow-up plan:   Return in about 4 weeks (around 12/24/2023) for PPE in person.     Recent Visits Date Type Provider Dept  11/04/23 Office Visit Marcelino Nurse, MD [facility]  Showing  recent visits within past 90 days and meeting all other requirements Today's Visits Date Type Provider Dept  11/26/23 Procedure visit Marcelino Nurse, MD [facility]  Showing today's visits and meeting all other requirements Future Appointments Date Type Provider Dept  12/24/23 Appointment Patel, Seema K, NP [facility]  Showing future appointments within next 90 days and meeting all other requirements   Disposition: Discharge home  Discharge (Date  Time): 11/26/2023; 1109 hrs.   Primary Care Physician: Orlean Alan HERO, FNP Location: [facility] Note by: Nurse Marcelino, MD (TTS technology used. I apologize for any typographical errors that were not detected and corrected.) Date: 11/26/2023; Time: 11:23 AM  Disclaimer:  Medicine is not an visual merchandiser. The only guarantee in medicine is that nothing is guaranteed. It is important to note that the decision to proceed with this intervention was based on the information collected from the patient. The Data and conclusions were drawn from the patient's questionnaire, the interview, and the physical examination. Because the information was provided in large part by the patient, it cannot be guaranteed that it has not been purposely or unconsciously manipulated. Every effort has been made to obtain as much relevant data as possible for this evaluation. It is important to note that the conclusions that lead to this procedure are derived in large part from the available data. Always take into account that the treatment will also be dependent on availability of resources and existing treatment guidelines, considered by other Pain Management Practitioners as being common knowledge and practice, at the time of the intervention. For Medico-Legal purposes, it is also important to point out that variation in procedural techniques and pharmacological choices are the acceptable norm. The indications,  contraindications, technique, and results of the above procedure should only be interpreted and judged by a Board-Certified Interventional Pain Specialist with extensive familiarity and expertise in the same exact procedure and technique.

## 2023-11-26 NOTE — Progress Notes (Signed)
 PROVIDER NOTE: Interpretation of information contained herein should be left to medically-trained personnel. Specific patient instructions are provided elsewhere under Patient Instructions section of medical record. This document was created in part using STT-dictation technology, any transcriptional errors that may result from this process are unintentional.  Patient: Nancy [name] Type: Established DOB: 12/18/1965 MRN: [id number] PCP: Orlean Alan HERO, FNP  Service: Procedure DOS: 11/26/2023 Setting: Ambulatory Location: Ambulatory outpatient facility Delivery: Face-to-face Provider: Wallie Sherry, MD Specialty: Interventional Pain Management Specialty designation: 09 Location: Outpatient facility Ref. Prov.: Sherry Wallie, MD       Interventional Therapy   Primary Reason for Visit: Interventional Pain Management Treatment. CC: Neck Pain (Worse on the left )    Procedure:          Anesthesia, Analgesia, Anxiolysis:  Type: Diagnostic, Greater, Occipital Nerve Block  #1  Region: Posterolateral Cervical Level: Occipital Ridge   Laterality: Bilateral  Anesthesia: Local (1-2% Lidocaine )   Sedation: IV Versed     Position: Prone   1. Cervical facet joint syndrome   2. Chronic pain syndrome   3. Bilateral occipital neuralgia    NAS-11 Pain score:   Pre-procedure: 6 /10   Post-procedure: 6 /10     H&P (Pre-op Assessment):  Nancy [name] is a 58 y.o. (year old), female patient, seen today for interventional treatment. She  has a past surgical history that includes Cholecystectomy; kidney stone removal; Tonsillectomy; Cardiac catheterization (Left, 06/04/2009); Knee arthroscopy w/ meniscal repair; Total knee arthroplasty (01/11/2011); Nose surgery (01/07/1989); Toe amputation; Amputation toe (Left, 03/16/2015); Irrigation and debridement foot (Left, 02/08/2021); Irrigation and debridement foot (Left, 02/14/2021); Application if wound vac (Left, 02/14/2021); Debridement and closure  wound (Left, 04/16/2021); Application if wound vac (Left, 04/16/2021); Amputation (Left, 09/19/2022); Amputation (Left, 09/24/2022); TEE without cardioversion (N/A, 09/26/2022); and IR IMAGING GUIDED PORT INSERTION (11/18/2022). Nancy [name] has a current medication list which includes the following prescription(s): acidophilus, albuterol , albuterol , alprazolam , aspirin  ec, carvedilol , desvenlafaxine , ensure max protein, fenofibrate , gabapentin , insulin  nph-regular human, lisinopril , lisinopril , montelukast , multivitamin with minerals, nystatin , olaparib , olaparib , ondansetron , pantoprazole , pravastatin , prochlorperazine , mounjaro , ubrelvy , freestyle libre 2 sensor, dicyclomine , diphenoxylate -atropine , hydromorphone , ibuprofen, lidocaine -prilocaine , metformin , methocarbamol , mirtazapine , naloxone , nystatin  ointment, ondansetron , and zinc  sulfate (50mg  elemental zinc ), and the following Facility-Administered Medications: heparin  lock flush and lactated ringers . Her primarily concern today is the Neck Pain (Worse on the left )  Initial Vital Signs:  Pulse/HCG Rate: 77ECG Heart Rate: 78 Temp: 98.1 F (36.7 C) Resp: 16 BP: (!) 169/65 SpO2: 100 %  BMI: Estimated body mass index is 42.53 kg/m as calculated from the following:   Height as of this encounter: 5' 9 (1.753 m).   Weight as of this encounter: 288 lb (130.6 kg).  Risk Assessment: Allergies: Reviewed. She is allergic to shellfish allergy, codeine, morphine  and codeine, rosuvastatin, metformin  and related, percocet [oxycodone -acetaminophen ], sulfa antibiotics, and theophyllines.  Allergy Precautions: None required Coagulopathies: Reviewed. None identified.  Blood-thinner therapy: None at this time Active Infection(s): Reviewed. None identified. Nancy [name] is afebrile  Site Confirmation: Nancy [name] was asked to confirm the procedure and laterality before marking the site Procedure checklist: Completed Consent: Before the procedure and  under the influence of no sedative(s), amnesic(s), or anxiolytics, the patient was informed of the treatment options, risks and possible complications. To fulfill our ethical and legal obligations, as recommended by the American Medical Association's Code of Ethics, I have informed the patient of my clinical impression; the nature and purpose of the treatment or procedure; the risks, benefits, and possible  complications of the intervention; the alternatives, including doing nothing; the risk(s) and benefit(s) of the alternative treatment(s) or procedure(s); and the risk(s) and benefit(s) of doing nothing. The patient was provided information about the general risks and possible complications associated with the procedure. These may include, but are not limited to: failure to achieve desired goals, infection, bleeding, organ or nerve damage, allergic reactions, paralysis, and death. In addition, the patient was informed of those risks and complications associated to the procedure, such as failure to decrease pain; infection; bleeding; organ or nerve damage with subsequent damage to sensory, motor, and/or autonomic systems, resulting in permanent pain, numbness, and/or weakness of one or several areas of the body; allergic reactions; (i.e.: anaphylactic reaction); and/or death. Furthermore, the patient was informed of those risks and complications associated with the medications. These include, but are not limited to: allergic reactions (i.e.: anaphylactic or anaphylactoid reaction(s)); adrenal axis suppression; blood sugar elevation that in diabetics may result in ketoacidosis or comma; water  retention that in patients with history of congestive heart failure may result in shortness of breath, pulmonary edema, and decompensation with resultant heart failure; weight gain; swelling or edema; medication-induced neural toxicity; particulate matter embolism and blood vessel occlusion with resultant organ, and/or  nervous system infarction; and/or aseptic necrosis of one or more joints. Finally, the patient was informed that Medicine is not an exact science; therefore, there is also the possibility of unforeseen or unpredictable risks and/or possible complications that may result in a catastrophic outcome. The patient indicated having understood very clearly. We have given the patient no guarantees and we have made no promises. Enough time was given to the patient to ask questions, all of which were answered to the patient's satisfaction. Ms. Brecheen has indicated that she wanted to continue with the procedure. Attestation: I, the ordering provider, attest that I have discussed with the patient the benefits, risks, side-effects, alternatives, likelihood of achieving goals, and potential problems during recovery for the procedure that I have provided informed consent. Date  Time: 11/26/2023  9:11 AM  Pre-Procedure Preparation:  Monitoring: As per clinic protocol. Respiration, ETCO2, SpO2, BP, heart rate and rhythm monitor placed and checked for adequate function Safety Precautions: Patient was assessed for positional comfort and pressure points before starting the procedure. Time-out: I initiated and conducted the Time-out before starting the procedure, as per protocol. The patient was asked to participate by confirming the accuracy of the Time Out information. Verification of the correct person, site, and procedure were performed and confirmed by me, the nursing staff, and the patient. Time-out conducted as per Joint Commission's Universal Protocol (UP.01.01.01). Time: 1029 Start Time: 1029 hrs.  Description of Procedure:          Target Area: Area medial to the occipital artery at the level of the superior nuchal ridge Approach: Posterior approach Area Prepped: Entire Posterior Occipital Region ChloraPrep (2% chlorhexidine  gluconate and 70% isopropyl alcohol) Safety Precautions: Aspiration looking  for blood return was conducted prior to all injections. At no point did we inject any substances, as a needle was being advanced. No attempts were made at seeking any paresthesias. Safe injection practices and needle disposal techniques used. Medications properly checked for expiration dates. SDV (single dose vial) medications used. Description of the Procedure: Protocol guidelines were followed. The target area was identified and the area prepped in the usual manner. Skin & deeper tissues infiltrated with local anesthetic. Appropriate amount of time allowed to pass for local anesthetics to take effect. The procedure  needles were then advanced to the target area. Proper needle placement secured. Negative aspiration confirmed. Solution injected in intermittent fashion, asking for systemic symptoms every 0.5cc of injectate. The needles were then removed and the area cleansed, making sure to leave some of the prepping solution back to take advantage of its long term bactericidal properties.  Vitals:   11/26/23 1025 11/26/23 1030 11/26/23 1035 11/26/23 1050  BP: (!) 165/77 (!) 176/87 (!) 156/75 (!) 140/57  Pulse:      Resp: 11 11 16    Temp:      TempSrc:      SpO2: 99% 100% 100% 100%  Weight:      Height:        Start Time: 1029 hrs. End Time: 1038 hrs. Materials:  Needle(s) Type: Spinal Needle Gauge: 22G Length: 3.5-in Medication(s): Please see orders for medications and dosing details. 8 cc solution made of 7 cc of 0.2% ropivacaine , 1 cc of Decadron  10 mg/cc. 4 cc injected for the left GON, 4 cc injected for the right GON   Antibiotic Prophylaxis:   Anti-infectives (From admission, onward)    None      Indication(s): None identified  Post-operative Assessment:  Post-procedure Vital Signs:  Pulse/HCG Rate: 7775 Temp: 98.1 F (36.7 C) Resp: 16 BP: (!) 140/57 SpO2: 100 %  EBL: None  Complications: No immediate post-treatment complications observed by team, or reported by  patient.  Note: The patient tolerated the entire procedure well. A repeat set of vitals were taken after the procedure and the patient was kept under observation following institutional policy, for this type of procedure. Post-procedural neurological assessment was performed, showing return to baseline, prior to discharge. The patient was provided with post-procedure discharge instructions, including a section on how to identify potential problems. Should any problems arise concerning this procedure, the patient was given instructions to immediately contact us , at any time, without hesitation. In any case, we plan to contact the patient by telephone for a follow-up status report regarding this interventional procedure.  Comments:  No additional relevant information.  Plan of Care (POC)  Orders:  Orders Placed This Encounter  Procedures   DG PAIN CLINIC C-ARM 1-60 MIN NO REPORT    Intraoperative interpretation by procedural physician at [facility].    Standing Status:   Standing    Number of Occurrences:   1    Reason for exam::   Assistance in needle guidance and placement for procedures requiring needle placement in or near specific anatomical locations not easily accessible without such assistance.    Medications ordered for procedure: Meds ordered this encounter  Medications   heparin  lock flush 100 unit/mL   lidocaine  (XYLOCAINE ) 2 % (with pres) injection 400 mg   lactated ringers  infusion   midazolam  PF (VERSED ) injection 0.5-2 mg    Make sure Flumazenil is available in the pyxis when using this medication. If oversedation occurs, administer 0.2 mg IV over 15 sec. If after 45 sec no response, administer 0.2 mg again over 1 min; may repeat at 1 min intervals; not to exceed 4 doses (1 mg)   dexamethasone  (DECADRON ) injection 20 mg   dexamethasone  (DECADRON ) injection 10 mg   ropivacaine  (PF) 2 mg/mL (0.2%) (NAROPIN ) injection 9 mL   Medications administered: We  administered lidocaine , lactated ringers , midazolam  PF, dexamethasone , dexamethasone , and ropivacaine  (PF) 2 mg/mL (0.2%).  See the medical record for exact dosing, route, and time of administration.    Follow-up plan:   Return in  about 4 weeks (around 12/24/2023) for PPE in person.     Recent Visits Date Type Provider Dept  11/04/23 Office Visit Marcelino Nurse, MD [facility]  Showing recent visits within past 90 days and meeting all other requirements Today's Visits Date Type Provider Dept  11/26/23 Procedure visit Marcelino Nurse, MD [facility]  Showing today's visits and meeting all other requirements Future Appointments Date Type Provider Dept  12/24/23 Appointment Patel, Seema K, NP [facility]  Showing future appointments within next 90 days and meeting all other requirements   Disposition: Discharge home  Discharge (Date  Time): 11/26/2023; 1109 hrs.   Primary Care Physician: Orlean Alan HERO, FNP Location: [facility] Note by: Nurse Marcelino, MD (TTS technology used. I apologize for any typographical errors that were not detected and corrected.) Date: 11/26/2023; Time: 11:26 AM  Disclaimer:  Medicine is not an visual merchandiser. The only guarantee in medicine is that nothing is guaranteed. It is important to note that the decision to proceed with this intervention was based on the information collected from the patient. The Data and conclusions were drawn from the patient's questionnaire, the interview, and the physical examination. Because the information was provided in large part by the patient, it cannot be guaranteed that it has not been purposely or unconsciously manipulated. Every effort has been made to obtain as much relevant data as possible for this evaluation. It is important to note that the conclusions that lead to this procedure are derived in large part from the available data. Always take into account that  the treatment will also be dependent on availability of resources and existing treatment guidelines, considered by other Pain Management Practitioners as being common knowledge and practice, at the time of the intervention. For Medico-Legal purposes, it is also important to point out that variation in procedural techniques and pharmacological choices are the acceptable norm. The indications, contraindications, technique, and results of the above procedure should only be interpreted and judged by a Board-Certified Interventional Pain Specialist with extensive familiarity and expertise in the same exact procedure and technique.

## 2023-11-26 NOTE — Progress Notes (Signed)
 Safety precautions to be maintained throughout the outpatient stay will include: orient to surroundings, keep bed in low position, maintain call bell within reach at all times, provide assistance with transfer out of bed and ambulation.

## 2023-11-27 ENCOUNTER — Telehealth: Payer: Self-pay | Admitting: *Deleted

## 2023-11-27 ENCOUNTER — Other Ambulatory Visit: Payer: Self-pay

## 2023-11-27 ENCOUNTER — Other Ambulatory Visit (HOSPITAL_COMMUNITY): Payer: Self-pay

## 2023-11-27 NOTE — Telephone Encounter (Signed)
 Post procedure call; voicemail left, mailbox full

## 2023-11-27 NOTE — Progress Notes (Addendum)
 Specialty Pharmacy Refill Coordination Note  Nancy [name] is a 58 y.o. female contacted today regarding refills of specialty medication(s) Olaparib  (LYNPARZA )   Patient requested (Patient-Rptd) Delivery   Delivery date: 12/05/23   Verified address: (Patient-Rptd) [address] [place] [address]   Medication will be filled on: 12/03/23   *Patient requested we change the delivery date to 12/02, now shipping 12/01.

## 2023-12-01 ENCOUNTER — Other Ambulatory Visit: Payer: Self-pay | Admitting: Family

## 2023-12-01 ENCOUNTER — Other Ambulatory Visit: Payer: Self-pay

## 2023-12-01 ENCOUNTER — Telehealth: Payer: Self-pay | Admitting: Student in an Organized Health Care Education/Training Program

## 2023-12-01 NOTE — Telephone Encounter (Signed)
 PT stated that she had injections on Wednesday of last week on Friday her chest started paining. When she walks she doesn't feel like she is getting oxygen .

## 2023-12-01 NOTE — Progress Notes (Signed)
 Specialty Pharmacy Ongoing Clinical Assessment Note  CYNDIE WOODBECK is a 58 y.o. female who is being followed by the specialty pharmacy service for RxSp Oncology   Patient's specialty medication(s) reviewed today: Olaparib  (LYNPARZA )   Missed doses in the last 4 weeks: 0   Patient/Caregiver did not have any additional questions or concerns.   Therapeutic benefit summary: Patient is achieving benefit   Adverse events/side effects summary: Experienced adverse events/side effects (diarrhea, controlled with Imodium PRN use)   Patient's therapy is appropriate to: Continue    Goals Addressed             This Visit's Progress    Slow Disease Progression   On track    Patient is on track. Patient will maintain adherence.  CA 125 declined to 5.7 on most recent lab, drawn on 11/04/23.         Follow up: 3 months  Silvano LOISE Dolly Specialty Pharmacist

## 2023-12-01 NOTE — Telephone Encounter (Signed)
 Spoke with patient and she is feeling some discomfort in her neck area.  States her oxygen  is good and she is monitoring it. States that it fels like a gas bubble and she needs to belch, but belching does not help.  States pain is much better from injection.  Instructed patient to go to the ED for any increase in symproms or concerns.  Patient states understanding.

## 2023-12-03 ENCOUNTER — Other Ambulatory Visit: Payer: Self-pay

## 2023-12-08 ENCOUNTER — Other Ambulatory Visit: Payer: Self-pay

## 2023-12-09 ENCOUNTER — Encounter: Payer: Self-pay | Admitting: Oncology

## 2023-12-09 NOTE — Care Plan (Signed)
 Pt dischagred home

## 2023-12-09 NOTE — Discharge Summary (Signed)
 ------------------------------------------------------------------------------- Attestation signed by Gehi, Anil Kishin, MD at 12/09/23 2236 I saw and evaluated the patient, participating in the key portions of the service on the day of discharge.  I reviewed the resident's note and agree with the discharge plans and disposition.  I personally spent  less than 30 minutes in discharge planning services.   Anil K Gehi, MD  -------------------------------------------------------------------------------   Physician Discharge Summary [facility] [address] [place] [address] Dept: [phone] Loc: [phone]   Identifying Information:  Nancy [name] [birth year] [id number]  Primary Care Physician: Don Lauraine Collar, FNP   Code Status: Full Code  Admit Date: 12/06/2023  Discharge Date: 12/09/2023   Discharge To: Home  Discharge Service: [facility] [place] - Cardiology Floor Team 2 [facility])   Discharge Attending Physician: Maude JINNY Rocks, MD  Discharge Diagnoses:  Principal Problem:   Pericardial effusion without cardiac tamponade (HHS-HCC) (POA: Unknown) Active Problems:   Chemotherapy-induced neuropathy (HHS-HCC) (POA: Yes)   Chemotherapy-induced nausea (POA: Yes)   Chemotherapy induced diarrhea (POA: Yes)   Chronic obstructive pulmonary disease (CMS-HCC) (POA: Yes)   Chronic pain disorder (POA: Yes)   Diabetes mellitus, type 2 (CMS-HCC) (POA: Yes)   Essential hypertension (POA: Yes)   Generalized anxiety disorder (POA: Yes)   Malignant neoplasm of ovary (CMS-HCC) (POA: Yes)   Mixed hyperlipidemia (POA: Yes)   Primary high grade serous adenocarcinoma of ovary    (CMS-HCC) (POA: Yes)   S/P BKA (below knee amputation), left    (CMS-HCC) (POA: Not Applicable)   Stress-induced cardiomyopathy (POA: Yes)   Atherosclerosis of native coronary artery of native heart with angina pectoris (POA: Yes)   Asthma without status asthmaticus (HHS-HCC) (POA:  Yes) Resolved Problems:   * No resolved hospital problems. [facility] Course:  Outpatient Follow-up [ ]  Consider restarting carvedilol  after monitoring peridcardial effusion stability/resolution [ ]  consider restarting lisinopril  with AKI resolution [ ]  hold pravastatin , ticro, Olaparib  until GI follow up and LFTs normalize  3F with hx of Takutsobo Cardiomyopathy in 2010 and high grade serous adenocarcinoma of the ovaries (s/p 4 cycles of Carbo-Taxol  and now on Oral Olaparib ) presenting with sudden onset chest pain, found to have pericardial effusion likely 2/2 malignancy.   #Chest Pain  Hypotension #Acute Pericardial Effusion - stable #Pericarditis  Pt with remote Cardiomyopathy hx pw acute onset chest pain found to have presumed new pericardial effusion. No chest trauma, no provoking incident, only antiplatelet monotherapy (ASA 81mg ). Troponin negative, flat, no significant EKG findings. No evidence of tamponade physiology. CTA w/ small pericardial effusion with hyperattenuating fluid cf hemopericardium, unclear chronicity, no aortic dissection. Formal TTE noted moderate pericardial effusion and LVOT obstructive gradient, recommended serial imaging. Given EKG changes with some ST depressions, elevated inflammatory markers, and the presence of the pericardial effusion, and characteristic chest pain (improves with leaning forward), started pericarditis management with colchicine. Continued holding aspirin  given initial CTA c/f hemopericardium.   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 S/p Debulking R1 on 02/20/2023, s/p 4 cycles of adjuvant carboplatin  taxol . Post treatment CT showed excellent response. She is on Olaparib  maintenance. No known risk for pericardial effusion with this medication. We did not think disease progression was contributing to symptoms above and  CTAP from 10/2023 did show stable mesenteric and omental stranding with peritoneal thickening and no evidence of disease progression on chest, abdomen, and pelvis. CA-125 wnl on admission, lower suspicion for disease  progression but held chemo while admitted. Will continue to hold until GI followup.   #AKI - prerenal; resolving No hx CKD, baseline on review of  lab work 1-1.2 over the last 6 months. Possibly iso cardiogenic etiology per above vs prerenal due to decreased PO intake. No evidence of hydronephrosis on CTA. Will defer to OP to resume lisinopril  with resolution of AKI.  #Elevated AST/ALT c/f 2/2 DILI  Hepatocellular patten. Significant elevation on admission to [facility]. APAP levels wnl. Lipase wnl. Per gyn onc, unlikely Olaparib  contributing. Hepatology consulted, noted c/f DILI though unclear what medication is contributing. Acute hepatitis panel, autoimmune work up (total IgG, ANA, ASMA), EBV PCR, CMV PCR pending.  #Hx Non-ischemic Cardiomyopathy (2016) #HFrecEF (>60%) Per [facility] records review, initially diagnosed with HF with reduced EF in 2010 (TTE 06/06/2008 LVEF<30%). Repeat echo 12/1 with EF of 70%.   #T2DM s/p L BKA (09/2022) , Long term Insulin -dependence Home regimen 70/30 70U BID (49U NPH, 21U regular)   Touchbase with Outpatient Provider: Warm Handoff: Completed on 12/09/23 by Verneita DELENA Eva, MD  (Intern) via [facility] Message  Procedures: None ______________________________________________________________________ Discharge Medications:    Your Medication List     PAUSE taking these medications    carvedilol  6.25 MG tablet Wait to take this until your doctor or other care provider tells you to start again. Commonly known as: COREG  Take 2 tablets (12.5 mg total) by mouth two (2) times a day.   fenofibrate  54 MG tablet Wait to take this until your doctor or other care provider tells you to start again. Commonly known as: TRICOR  Take 1 tablet (54  mg total) by mouth daily.   lisinopril  10 MG tablet Wait to take this until your doctor or other care provider tells you to start again. Commonly known as: PRINIVIL ,ZESTRIL  Take 1 tablet (10 mg total) by mouth daily.   lisinopril  5 MG tablet Wait to take this until your doctor or other care provider tells you to start again. Commonly known as: PRINIVIL ,ZESTRIL  Take 1 tablet (5 mg total) by mouth two (2) times a day.   olaparib  100 mg tablet Wait to take this until your doctor or other care provider tells you to start again. Commonly known as: LYNPARZA  Take 1 tablet (100 mg total) by mouth two (2) times a day.   olaparib  150 mg tablet Wait to take this until your doctor or other care provider tells you to start again. Commonly known as: LYNPARZA  Take 1 tablet (150 mg total) by mouth two (2) times a day.   pravastatin  40 MG tablet Wait to take this until your doctor or other care provider tells you to start again. Commonly known as: PRAVACHOL  Take 1 tablet (40 mg total) by mouth daily.   tirzepatide  7.5 mg/0.5 mL Pnij Wait to take this until your doctor or other care provider tells you to start again. Inject 0.5 mL (7.5 mg total) under the skin every seven (7) days.       STOP taking these medications    aspirin  81 MG tablet Commonly known as: ECOTRIN   ibuprofen 800 MG tablet Commonly known as: MOTRIN       START taking these medications    colchicine 0.6 mg tablet Commonly known as: COLCRYS Take 1 tablet (0.6 mg total) by mouth two (2) times a day.   lidocaine  4 % patch Commonly known as: ASPERCREME Place 1 patch on the skin daily as needed (Pain).  CHANGE how you take these medications    insulin  NPH-insulin  regular (70/30) 100 unit/mL (70-30) injection Commonly known as: HumuLIN /NovoLIN Inject 0.5 mL (50 Units total) under the skin two (2) times a day. What changed: how much to take       CONTINUE taking these medications    ALPRAZolam  0.5  MG tablet Commonly known as: XANAX  Take 1 tablet (0.5 mg total) by mouth two (2) times a day as needed for anxiety.   desvenlafaxine  succinate 100 MG 24 hr tablet Commonly known as: PRISTIQ  Take 1 tablet (100 mg total) by mouth before bedtime.   gabapentin  300 MG capsule Commonly known as: NEURONTIN  Take 1 capsule (300 mg total) by mouth daily as needed.   montelukast  10 mg tablet Commonly known as: SINGULAIR  Take 1 tablet (10 mg total) by mouth daily. TAKE 1 TABLET (10 MG TOTAL) BY MOUTH DAILY.   multivitamin per tablet Commonly known as: TAB-A-VITE/THERAGRAN Take 1 tablet by mouth daily.   ondansetron  8 MG disintegrating tablet Commonly known as: ZOFRAN -ODT Take 1 tablet (8 mg total) by mouth every eight (8) hours as needed.   pantoprazole  40 MG tablet Commonly known as: Protonix  Take 1 tablet (40 mg total) by mouth daily. TAKE 1 TABLET (40 MG TOTAL) BY MOUTH DAILY.   UBRELVY  100 mg tablet Generic drug: ubrogepant  Take 1 tablet (100 mg total) by mouth daily as needed.        Allergies: Codeine, Opioids - morphine  analogues, Penicillins, Percocet [oxycodone -acetaminophen ], Sulfa (sulfonamide antibiotics), and Theophylline ______________________________________________________________________ Pending Test Results: Pending Labs     Order Current Status   Anti-Nuclear Antibody (ANA) In process   Anti-Smooth Muscle Antibody, IgG In process   Blood Culture #1 In process   Blood Culture #2 In process   Phosphatidylethanol (PEth) In process       Most Recent Labs: All lab results last 24 hours -  Recent Results (from the past 24 hours)  POCT Glucose   Collection Time: 12/08/23 11:20 AM  Result Value Ref Range   Glucose, POC 250 (H) 70 - 179 mg/dL  ECG 12 Lead   Collection Time: 12/08/23  2:46 PM  Result Value Ref Range   EKG Systolic BP  mmHg   EKG Diastolic BP  mmHg   EKG Ventricular Rate 81 BPM   EKG Atrial Rate 81 BPM   EKG P-R Interval 142 ms   EKG QRS  Duration 88 ms   EKG Q-T Interval 366 ms   EKG QTC Calculation 425 ms   EKG Calculated P Axis 6 degrees   EKG Calculated R Axis 44 degrees   EKG Calculated T Axis 52 degrees   QTC Fredericia 404 ms  POCT Glucose   Collection Time: 12/08/23  3:53 PM  Result Value Ref Range   Glucose, POC 202 (H) 70 - 179 mg/dL  Urinalysis with Microscopy   Collection Time: 12/08/23  4:15 PM  Result Value Ref Range   Color, UA Yellow    Clarity, UA Clear    Specific Gravity, UA 1.037 (H) 1.003 - 1.030   pH, UA 5.0 5.0 - 9.0   Leukocyte Esterase, UA Moderate (A) Negative   Nitrite, UA Negative Negative   Protein, UA 30 mg/dL (A) Negative   Glucose, UA 100 mg/dL (A) Negative   Ketones, UA Negative Negative   Urobilinogen, UA <2.0 mg/dL <7.9 mg/dL   Bilirubin, UA Negative Negative   Blood, UA Negative Negative   RBC, UA 1 <=4 /HPF  WBC, UA 15 (H) 0 - 5 /HPF   Squam Epithel, UA 5 0 - 5 /HPF   Bacteria, UA Rare (A) None Seen /HPF   Trans Epithel, UA <1 0 - 2 /HPF   Hyaline Casts, UA 3 (H) 0 - 1 /LPF   Mucus, UA Rare (A) None Seen /HPF  POCT Glucose   Collection Time: 12/08/23  8:02 PM  Result Value Ref Range   Glucose, POC 253 (H) 70 - 179 mg/dL  CBC   Collection Time: 12/09/23  5:48 AM  Result Value Ref Range   WBC 8.9 3.6 - 11.2 10*9/L   RBC 2.51 (L) 3.95 - 5.13 10*12/L   HGB 8.4 (L) 11.3 - 14.9 g/dL   HCT 74.7 (L) 65.9 - 55.9 %   MCV 100.4 (H) 77.6 - 95.7 fL   MCH 33.4 (H) 25.9 - 32.4 pg   MCHC 33.3 32.0 - 36.0 g/dL   RDW 81.9 (H) 87.7 - 84.7 %   MPV 9.5 6.8 - 10.7 fL   Platelet 163 150 - 450 10*9/L  Comprehensive Metabolic Panel   Collection Time: 12/09/23  5:48 AM  Result Value Ref Range   Sodium 134 (L) 135 - 145 mmol/L   Potassium 4.9 (H) 3.4 - 4.8 mmol/L   Chloride 97 (L) 98 - 107 mmol/L   CO2 27.0 20.0 - 31.0 mmol/L   Anion Gap 10 5 - 14 mmol/L   BUN 44 (H) 9 - 23 mg/dL   Creatinine 8.32 (H) 9.44 - 1.02 mg/dL   BUN/Creatinine Ratio 26    eGFR CKD-EPI (2021) Female 35  (L) >=60 mL/min/1.71m2   Glucose 215 (H) 70 - 99 mg/dL   Calcium 8.4 (L) 8.7 - 10.4 mg/dL   Albumin 3.0 (L) 3.4 - 5.0 g/dL   Total Protein 6.2 5.7 - 8.2 g/dL   Total Bilirubin 0.6 0.3 - 1.2 mg/dL   AST 618 (H) <=65 U/L   ALT 772 (H) 10 - 49 U/L   Alkaline Phosphatase 98 46 - 116 U/L  Magnesium  Level   Collection Time: 12/09/23  5:48 AM  Result Value Ref Range   Magnesium  2.0 1.6 - 2.6 mg/dL  PT-INR   Collection Time: 12/09/23  5:48 AM  Result Value Ref Range   PT 17.0 (H) 9.9 - 12.6 sec   INR 1.50   POCT Glucose   Collection Time: 12/09/23  7:03 AM  Result Value Ref Range   Glucose, POC 224 (H) 70 - 179 mg/dL  Cortisol   Collection Time: 12/09/23  8:22 AM  Result Value Ref Range   Cortisol 27.0 See Comment ug/dL  POCT Glucose   Collection Time: 12/09/23  8:28 AM  Result Value Ref Range   Glucose, POC 216 (H) 70 - 179 mg/dL   Microbiology -  Microbiology Results (last day)     Procedure Component Value Date/Time Date/Time   Blood Culture #1 [[id number]] Collected: 12/08/23 1321   Lab Status: In process Specimen: Blood from 1 Peripheral Draw Updated: 12/08/23 1440   Blood Culture #2 [[id number]] Collected: 12/08/23 1321   Lab Status: In process Specimen: Blood from 1 Peripheral Draw Updated: 12/08/23 1440       Relevant Studies/Radiology: Echocardiogram Follow Up/Limited Echo Result Date: 12/08/2023 Patient Info Name:     Ewelina Naves Age:     35 years DOB:     05-27-1965 Gender:     Female MRN:     [id number] Accession #:     [id number] ROLANDA  Account #:     [id number] Ht:     175 cm Wt:     131 kg BSA:     2.58 m2 BP:     135 /     73 mmHg HR:     85 bpm Exam Date:     12/08/2023 10:49 AM Admit Date:     12/06/2023 Exam Type:     ECHOCARDIOGRAM FOLLOW UP/LIMITED ECHO Technical Quality:     Fair Staff Sonographer:     So Talitha Krabbe Ordering Physician:     Hadassah CROME Sison Study Info Indications      - serial echo - pericardial effusion and LVOT gradient Procedure(s)    Limited 2D, color flow and Doppler transthoracic echocardiogram is performed. Summary   1. The left ventricular systolic function is hyperdynamic, LVEF is visually estimated at >70%.   2. Mild intracavitary end systolic gradient. Peak LVOT gradient not assessed on this study (continuous wave doppler not used).   3. There is a moderate, circumferential pericardial effusion.   4. IVC size and inspiratory change suggest elevated right atrial pressure. (10-20 mmHg). Left Ventricle   The left ventricular systolic function is hyperdynamic, LVEF is visually estimated at >70%. Inferior Vena Cava   IVC size and inspiratory change suggest elevated right atrial pressure. (10-20 mmHg). Pericardium/Pleural   There is a moderate, circumferential pericardial effusion. Maximal dimension of the pericardial effusion:  0.9 cm. There is no echocardiographic evidence of tamponade physiology. Other Findings   Rhythm: Sinus Rhythm. Ventricles ---------------------------------------------------------------------- Name                                 Value        Normal ---------------------------------------------------------------------- LV Dimensions 2D/MM ---------------------------------------------------------------------- LVID Diastole (2D)                  4.3 cm       3.8-5.2  LVPW Diastolic Thickness (2D)                                0.7 cm       0.6-0.9 LVID Systole (2D)                   3.4 cm       2.2-3.5  Relative Wall Thickness (2D)                                  0.33        <=0.42 Tricuspid Valve ---------------------------------------------------------------------- Name                                 Value        Normal ---------------------------------------------------------------------- Estimated PAP/RSVP ---------------------------------------------------------------------- RA Pressure                        15 mmHg           <=5 Venous ---------------------------------------------------------------------- Name                                  Value        Normal ---------------------------------------------------------------------- IVC/SVC ---------------------------------------------------------------------- IVC Diameter (Exp 2D)  2.4 cm         <=2.1 Pericardium ---------------------------------------------------------------------- Name                                 Value        Normal ---------------------------------------------------------------------- Pericardium 2D/MM ----------------------------------------------------------------------  Pericardial Effusion Diastole (2D)                       0.9 cm Report Signatures Finalized by Fairy Beverley Seton  MD on 12/08/2023 04:12 PM Resident Reyes Gory  MD on 12/08/2023 04:01 PM  ECG 12 Lead Result Date: 12/08/2023 NORMAL SINUS RHYTHM LOW VOLTAGE QRS POSSIBLE ACUTE PERICARDITIS ABNORMAL ECG WHEN COMPARED WITH ECG OF 07-Dec-2023 09:06, ST ELEVATION NOW PRESENT IN INFERIOR LEADS  ECG 12 Lead Result Date: 12/07/2023 NORMAL SINUS RHYTHM NORMAL ECG WHEN COMPARED WITH ECG OF 07-Dec-2023 03:28, NO SIGNIFICANT CHANGE WAS FOUND Confirmed by Cleotilde Moccasin ([id number]) on 12/07/2023 3:44:34 PM  CTA Chest/Abd (Aortic Dissection) Result Date: 12/07/2023 EXAM: CTA Chest, Abdomen, Pelvis for Aortic Dissection DATE: 12/06/2023 6:35 PM ACCESSION: [id number] UN DICTATED: 12/06/2023 6:54 PM INTERPRETATION LOCATION: MAIN CAMPUS CLINICAL INDICATION: 58 years old Female with Sudden onset of chest pain with radiation to the neck, back, diaphoresis and presyncopal episodes eval for dissection  COMPARISON: None TECHNIQUE: A helical CT of the chest was obtained without IV contrast from the thoracic inlet through the hemidiaphragms. Images were reconstructed in the axial plane. Next, a spiral CTA  of the chest, abdomen and pelvis was obtained with IV contrast from the thoracic inlet through the aortic bifurcation. Images were reconstructed in the axial plane.  Multiplanar  reformatted and MIP images are provided for further evaluation of the aorta. FINDINGS: AORTA: Normal caliber aorta. No thoracic aortic intramural hematoma.  No aortic dissection. Moderate scattered calcifications of the abdominal aorta. CHEST: Normal heart size. There is a small pericardial effusion with hyperattenuating fluid. No mediastinal lymphadenopathy. Clear central airways. No consolidation.  No pleural effusion. Mild centrilobular emphysema bilaterally. Right tunneled Port-A-Cath with tip terminating at the superior cavoatrial junction. ABDOMEN and PELVIS: HEPATOBILIARY: No focal hepatic lesions. The gallbladder is surgically absent. No biliary dilatation.  SPLEEN: Normal in size and contour. PANCREAS: Normal pancreatic contour. No ductal dilatation. No suspicious lesions. ADRENALS: Unremarkable. KIDNEYS/URETERS: Cortical scarring of the bilateral kidneys, likely sequelae of prior infection. 5 mm nonobstructive calculi within the left upper pole. No hydronephrosis. BLADDER: Unremarkable. PELVIC/REPRODUCTIVE ORGANS: The uterus is surgically absent. No suspicious adnexal lesions. GI TRACT: Small hiatal hernia. No dilated or thick walled loops of bowel. PERITONEUM/RETROPERITONEUM AND MESENTERY: No free air or fluid. LYMPH NODES: No enlarged lymph nodes. BONES: Unremarkable. SOFT TISSUES: Unremarkable.   -- There is a small pericardial effusion with areas of hyperattenuating fluid. This may be suggestive of hemopericardium in the correct clinical setting. This may represent an acute versus chronic process, which is difficult to delineate without prior comparisons. --No CT evidence of aortic dissection. The findings of this study were discussed via telephone with DR. MARIECELY LUCIANO-FEIJOO' by Dr. Armin Sieve on 12/06/2023 7:23 PM. -----------------------------------------------  Echocardiogram W Colorflow Spectral Doppler With Contrast Result Date: 12/07/2023 Patient Info Name:     Rheannon Cerney Age:     24 years DOB:     03-01-1965 Gender:     Female MRN:     [id number] Accession #:     [id number] Account #:     [id number]  Wt:     131 kg BP:     99 /     77 mmHg HR:     80 bpm Exam Date:     12/07/2023 11:11 AM Admit Date:     12/06/2023 Exam Type:     ECHOCARDIOGRAM W COLORFLOW SPECTRAL DOPPLER W CONTRAST Technical Quality:     Poor Reason for Poor Study:     poor echocardiographic windows Staff Sonographer:     Montie Furnish Ordering Physician:     Elonda Fell Study Info Indications      - pericardial effusion ; Definity /Optison  Procedure(s)   Complete two-dimensional, color flow and Doppler transthoracic echocardiogram is performed with contrast to opacify the left ventricle and to improve the delineation of the left ventricle endocardial borders. Ultrasound Enhancing Agent/Agitated Saline ------------------------------ UEA/Ag. Saline:     Optison  Amount:     --- ml IV Access Condition:     patent with no signs of infiltration Summary   1. Extremely difficult study despite the use of contrast.   2. The left ventricle is normal in size with increased wall thickness.   3. The left ventricular systolic function is normal, LVEF is visually estimated at > 55%.   4. The right ventricle is not well visualized but probably normal in size.   5. There is a moderate pericardial effusion, most predominately anteriorly. There is no clear hemodynamic evidence of tamponade (based on IVC collapse and respiratory variation across the valves), though RV diastolic collapse cannot be excluded (image 68). Clinical correlation required.   6. There is a measured LVOT obstructive gradient, detected on this study, without clear visualization of the left ventricular outflow tract. This may represent hyperdynamic LV or other process. Recommendations   * Serial imaging of the pericardial effusion and consider cardiac MRI if LVOT gradient persists with repeat imaging and the quality of the 2D echocardiogram does  not improve. Left Ventricle   The left ventricle is normal in size with increased wall thickness. The left ventricular systolic function is normal, LVEF is visually estimated at > 55%. There is a measured LVOT obstructive gradient, detected on this study, without clear visualization of the left ventricular outflow tract. This may represent hyperdynamic LV or other process. Right Ventricle   The right ventricle is not well visualized but probably normal in size. Left Atrium   The left atrium is not well visualized. Right Atrium   The right atrium is not well visualized. Aortic Valve   The aortic valve is not well visualized. Mitral Valve   The mitral valve is not well visualized. Tricuspid Valve   The tricuspid valve is not well visualized. The pulmonary systolic pressure cannot be estimated due to insufficient TR signal. Pulmonic Valve   Pulmonary valve is not well visualized. Aorta   The aorta is not well visualized. Inferior Vena Cava   IVC size and inspiratory change suggest mildly elevated right atrial pressure. (5-10 mmHg). Pericardium/Pleural   There is a moderate pericardial effusion. There is a moderate pericardial effusion, most predominately anteriorly. There is no clear hemodynamic evidence of tamponade (based on IVC collapse and respiratory variation across the valves), though RV diastolic collapse cannot be excluded (image 68). Clinical correlation required. Ventricles ---------------------------------------------------------------------- Name                                 Value        Normal ---------------------------------------------------------------------- LV  Dimensions 2D/MM ---------------------------------------------------------------------- LVID Diastole (2D)                  3.7 cm       3.8-5.2 LVID Systole (2D)                   2.4 cm       2.2-3.5 Aortic Valve ---------------------------------------------------------------------- Name                                 Value        Normal  ---------------------------------------------------------------------- AV Doppler ---------------------------------------------------------------------- AV Peak Velocity                   2.2 m/s               AV Peak Gradient                   19 mmHg Mitral Valve ---------------------------------------------------------------------- Name                                 Value        Normal ---------------------------------------------------------------------- MV Diastolic Function ---------------------------------------------------------------------- MV E Peak Velocity                 71 cm/s               MV A Peak Velocity                 98 cm/s               MV E/A                                 0.7               MV Annular TDI ---------------------------------------------------------------------- MV Septal e' Velocity             5.0 cm/s         >=8.0 MV E/e' (Septal)                      14.4               MV Lateral e' Velocity            6.7 cm/s        >=10.0 MV E/e' (Lateral)                     10.6               MV e' Average                     5.8 cm/s               MV E/e' (Average)                     12.5 Tricuspid Valve ---------------------------------------------------------------------- Name                                 Value        Normal ---------------------------------------------------------------------- Estimated PAP/RSVP ---------------------------------------------------------------------- RA Pressure  8 mmHg           <=5 Pulmonic Valve ---------------------------------------------------------------------- Name                                 Value        Normal ---------------------------------------------------------------------- PV Doppler ---------------------------------------------------------------------- PV Peak Velocity                   1.1 m/s Report Signatures Finalized by Devere JAYSON Banks on 12/07/2023 01:31 PM  US  Liver Doppler Result Date:  12/07/2023 EXAM: US  LIVER DOPPLER ACCESSION: [id number] UN REPORT DATE: 12/07/2023 10:28 AM CLINICAL INDICATION: 58 years old with elevated transaminases, malignancy cf thrombus  COMPARISON: CTA aortic dissection 12/06/2023 TECHNIQUE: Ultrasound views of the liver vasculature were obtained using grayscale, color Doppler, and spectral Doppler analysis FINDINGS: PORTAL VEIN: The main, left and right portal veins were patent with hepatopetal flow. There is sluggish flow within the main portal vein.      Main portal vein diameter: 1.41 cm      Main portal vein velocity: 0.110 m/s      Anterior right portal vein velocity: 0.251 m/s      Posterior right portal vein velocity: 0.164 m/s      Left portal vein velocity: 0.150 m/s      Main portal vein flow: hepatopetal      Right portal vein flow: hepatopetal      Left portal vein flow: hepatopetal SPLENIC VEIN: Patent, with hepatopetal flow.      Splenic vein midline: hepatopetal      Splenic vein proximal: hepatopetal HEPATIC VEINS/IVC: The IVC, left, middle and right hepatic veins were were patent.      Left hepatic vein phasicity/flow: triphasic      Middle hepatic vein phasicity/flow: triphasic      Right hepatic vein phasicity/flow: triphasic      Inferior vena cava phasicity/flow: bi-tri HEPATIC ARTERY: Patent with color and spectral Doppler imaging      Common hepatic artery: Patent OTHER: No ascites.   Patent hepatic vasculature with sluggish flow within the main portal vein, which can be seen with portal hypertension.   ECG 12 Lead Result Date: 12/07/2023 NORMAL SINUS RHYTHM NORMAL ECG WHEN COMPARED WITH ECG OF 06-Dec-2023 23:04, NO SIGNIFICANT CHANGE WAS FOUND Confirmed by Cleotilde Moccasin [phone]) on 12/07/2023 9:45:42 AM  ECG 12 Lead Result Date: 12/07/2023 NORMAL SINUS RHYTHM NORMAL ECG WHEN COMPARED WITH ECG OF 06-Dec-2023 19:38, NO SIGNIFICANT CHANGE WAS FOUND Confirmed by Cleotilde Moccasin ([id number]) on 12/07/2023 9:22:41 AM  ECG 12 Lead Result Date:  12/07/2023 NORMAL SINUS RHYTHM NORMAL ECG WHEN COMPARED WITH ECG OF 06-Dec-2023 17:03, NO SIGNIFICANT CHANGE WAS FOUND Confirmed by Cleotilde Moccasin ([id number]) on 12/07/2023 9:09:30 AM  XR Chest 2 views Result Date: 12/06/2023 EXAM: XR CHEST 2 VIEWS ACCESSION: [id number] UN REPORT DATE: 12/06/2023 6:16 PM CLINICAL INDICATION: CHEST PAIN  TECHNIQUE: PA and Lateral Chest Radiographs COMPARISON: Chest radiograph dated 06/15/2019 FINDINGS: Right-sided tunneled catheter with tip projecting over the cavoatrial junction. Lungs are clear.  No pleural effusion or pneumothorax. Cardiac silhouette is normal in size. Thoracic aorta with calcifications.   No acute cardiopulmonary abnormalities.   ECG 12 Lead Result Date: 12/06/2023 SINUS TACHYCARDIA LOW VOLTAGE QRS WHEN COMPARED WITH ECG OF 15-Jun-2019 22:55, NO SIGNIFICANT CHANGE WAS FOUND RATE HAS INCREASED Confirmed by Vinie Dunnings ([id number]) on 12/06/2023 5:55:52 PM  ______________________________________________________________________ Discharge Instructions:  Appointments which have been scheduled for you    Dec 29, 2023 9:00 AM ECHOCARDIOGRAM WITH COLORFLOW SPECTRAL DOPPLER with [facility]      Dec 29, 2023 9:40 AM RETURN CARDIOLOGY with Jama Old, MD      Dec 30, 2023 11:00 AM Appointment with Orlean Alan Jansky, FNP [phone] [address] [facility] [address]  [facility] [place] [id number] [phone]  Hospital Follow Up      ______________________________________________________________________ Discharge Day Services: BP 127/71   Pulse 77   Temp 36.9 C (98.4 F) (Oral)   Resp 17   Ht 175.3 cm (5' 9)   Wt (!) 130.6 kg (288 lb)   SpO2 93%   BMI 42.53 kg/m   Pt seen on the day of discharge and determined appropriate for discharge.  Condition at Discharge: stable  Length of Discharge: I spent greater than 30 mins in the discharge of this patient.

## 2023-12-10 ENCOUNTER — Telehealth: Payer: Self-pay | Admitting: Family

## 2023-12-10 NOTE — Telephone Encounter (Signed)
 Patient was d/c from hospital yesterday from having chest pains. Was not sent home with anything for the pains. They told her she has fluid built up around the heart and sent home on colchicine. Patient describes the pain as a stabbing pain. She also mentioned that her liver enzymes were elevated. She doesn't follow up with the [facility] cardiologist until 11/22. She has a hospital follow up with [facility] 11/23. Please advise if there is anything we can send her for the chest pains or do for her before her 11/23 appointment.

## 2023-12-12 NOTE — Telephone Encounter (Signed)
 Patient left VM stating she is having stabbing pains in her side. She really doesn't want to go back to the hospital.   Per Alan verbal, she feels like she needs to go back to the hospital so she can get some answers hopefully and some relief.  Patient informed and agreed to go to ED.

## 2023-12-15 ENCOUNTER — Encounter (INDEPENDENT_AMBULATORY_CARE_PROVIDER_SITE_OTHER)

## 2023-12-15 ENCOUNTER — Ambulatory Visit (INDEPENDENT_AMBULATORY_CARE_PROVIDER_SITE_OTHER): Admitting: Vascular Surgery

## 2023-12-17 ENCOUNTER — Telehealth: Payer: Self-pay | Admitting: Family

## 2023-12-17 NOTE — Telephone Encounter (Signed)
 Patient left VM stating she did not go to the ER last weekend because she started to feel better. She did state that she has been having headaches though and thinks it is because she was recently taken off of all of her BP meds when she was in the hospital.   Per Alan verbal, please call patient and get her to check her BP. We may need to put her back on one of them at least.

## 2023-12-18 ENCOUNTER — Telehealth (INDEPENDENT_AMBULATORY_CARE_PROVIDER_SITE_OTHER): Payer: Self-pay

## 2023-12-18 NOTE — Telephone Encounter (Signed)
 Nancy [name], [facility] called and they need an addendum to patients clinic note for 11/10/23 including patients K-level @ what level does this patient function for prosthetic order. She stated she could send over the evaluation note where they evaluated patient for prosthesis if needed.

## 2023-12-19 NOTE — Telephone Encounter (Signed)
 Per Alan her and [facility] spoke about this yesterday

## 2023-12-20 ENCOUNTER — Other Ambulatory Visit (INDEPENDENT_AMBULATORY_CARE_PROVIDER_SITE_OTHER): Payer: Self-pay | Admitting: Nurse Practitioner

## 2023-12-22 ENCOUNTER — Encounter: Payer: Self-pay | Admitting: Oncology

## 2023-12-22 ENCOUNTER — Inpatient Hospital Stay: Attending: Oncology | Admitting: Oncology

## 2023-12-22 ENCOUNTER — Inpatient Hospital Stay: Attending: Oncology

## 2023-12-22 VITALS — BP 149/74 | HR 103 | Temp 99.2°F | Resp 18 | Wt 289.0 lb

## 2023-12-22 DIAGNOSIS — I3139 Other pericardial effusion (noninflammatory): Secondary | ICD-10-CM | POA: Diagnosis not present

## 2023-12-22 DIAGNOSIS — R7989 Other specified abnormal findings of blood chemistry: Secondary | ICD-10-CM | POA: Diagnosis not present

## 2023-12-22 DIAGNOSIS — I10 Essential (primary) hypertension: Secondary | ICD-10-CM | POA: Diagnosis not present

## 2023-12-22 DIAGNOSIS — Z9221 Personal history of antineoplastic chemotherapy: Secondary | ICD-10-CM | POA: Insufficient documentation

## 2023-12-22 DIAGNOSIS — I110 Hypertensive heart disease with heart failure: Secondary | ICD-10-CM | POA: Diagnosis not present

## 2023-12-22 DIAGNOSIS — C563 Malignant neoplasm of bilateral ovaries: Secondary | ICD-10-CM | POA: Diagnosis present

## 2023-12-22 DIAGNOSIS — I429 Cardiomyopathy, unspecified: Secondary | ICD-10-CM | POA: Diagnosis not present

## 2023-12-22 DIAGNOSIS — Z1502 Genetic susceptibility to malignant neoplasm of ovary: Secondary | ICD-10-CM | POA: Diagnosis not present

## 2023-12-22 DIAGNOSIS — C569 Malignant neoplasm of unspecified ovary: Secondary | ICD-10-CM

## 2023-12-22 DIAGNOSIS — Z7982 Long term (current) use of aspirin: Secondary | ICD-10-CM | POA: Diagnosis not present

## 2023-12-22 DIAGNOSIS — Z79899 Other long term (current) drug therapy: Secondary | ICD-10-CM | POA: Diagnosis not present

## 2023-12-22 DIAGNOSIS — Z1509 Genetic susceptibility to other malignant neoplasm: Secondary | ICD-10-CM | POA: Insufficient documentation

## 2023-12-22 DIAGNOSIS — R971 Elevated cancer antigen 125 [CA 125]: Secondary | ICD-10-CM | POA: Insufficient documentation

## 2023-12-22 LAB — CBC WITH DIFFERENTIAL (CANCER CENTER ONLY)
Abs Immature Granulocytes: 0.04 K/uL (ref 0.00–0.07)
Basophils Absolute: 0.1 K/uL (ref 0.0–0.1)
Basophils Relative: 1 %
Eosinophils Absolute: 0.5 K/uL (ref 0.0–0.5)
Eosinophils Relative: 5 %
HCT: 31.9 % — ABNORMAL LOW (ref 36.0–46.0)
Hemoglobin: 9.9 g/dL — ABNORMAL LOW (ref 12.0–15.0)
Immature Granulocytes: 1 %
Lymphocytes Relative: 30 %
Lymphs Abs: 2.5 K/uL (ref 0.7–4.0)
MCH: 31.8 pg (ref 26.0–34.0)
MCHC: 31 g/dL (ref 30.0–36.0)
MCV: 102.6 fL — ABNORMAL HIGH (ref 80.0–100.0)
Monocytes Absolute: 0.8 K/uL (ref 0.1–1.0)
Monocytes Relative: 10 %
Neutro Abs: 4.5 K/uL (ref 1.7–7.7)
Neutrophils Relative %: 53 %
Platelet Count: 499 K/uL — ABNORMAL HIGH (ref 150–400)
RBC: 3.11 MIL/uL — ABNORMAL LOW (ref 3.87–5.11)
RDW: 16.8 % — ABNORMAL HIGH (ref 11.5–15.5)
WBC Count: 8.4 K/uL (ref 4.0–10.5)
nRBC: 0 % (ref 0.0–0.2)

## 2023-12-22 LAB — CMP (CANCER CENTER ONLY)
ALT: 31 U/L (ref 0–44)
AST: 21 U/L (ref 15–41)
Albumin: 4.1 g/dL (ref 3.5–5.0)
Alkaline Phosphatase: 92 U/L (ref 38–126)
Anion gap: 13 (ref 5–15)
BUN: 23 mg/dL — ABNORMAL HIGH (ref 6–20)
CO2: 23 mmol/L (ref 22–32)
Calcium: 9.4 mg/dL (ref 8.9–10.3)
Chloride: 102 mmol/L (ref 98–111)
Creatinine: 0.9 mg/dL (ref 0.44–1.00)
GFR, Estimated: >60 mL/min (ref >60)
Glucose, Bld: 163 mg/dL — ABNORMAL HIGH (ref 70–99)
Potassium: 4.4 mmol/L (ref 3.5–5.1)
Sodium: 137 mmol/L (ref 135–145)
Total Bilirubin: 0.4 mg/dL (ref 0.0–1.2)
Total Protein: 7.5 g/dL (ref 6.5–8.1)

## 2023-12-22 LAB — MAGNESIUM: Magnesium: 2.1 mg/dL (ref 1.7–2.4)

## 2023-12-22 NOTE — Assessment & Plan Note (Signed)
 All her BP meds were discontinued during her admission due to hypotension.  Currently BP has improved and trending high.  Advise patient to resume Coreg , monitor her BP and follow up with PCP

## 2023-12-22 NOTE — Assessment & Plan Note (Addendum)
 LFT has normalized.  Likely reactive to acute viral infection

## 2023-12-22 NOTE — Progress Notes (Signed)
 Hematology/Oncology Progress note [phone] [phone]      CHIEF COMPLAINTS  High grade serous carcinoma of ovaries.   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 Post treatment CT showed excellent response. S/p Debulking R1 on 02/20/2023, s/p 4 cycles of adjuvant carboplatin  taxol  Currently on Olaparib  maintenance.  Labs are reviewed and discussed with patient. She did not tolerate Olaparib  300mg  BID due to severe diarrhea.   Previously tolerated 250mg  twice daily. Hold off for now to allow full recovery from acute symptoms.  Aspirin  81mg  daily for thrombosis prophylaxis. .  Genetic evaluation - BRIP1 mutation  Pericardial effusion History of Takotsubo cardiomyopathy Incidental findings of pleural effusion, this is likely due to acute viral infection.  Less likely due to Olaparib  Follow up with cardiology  Essential hypertension All her BP meds were discontinued during her admission due to hypotension.  Currently BP has improved and trending high.  Advise patient to resume Coreg , monitor her BP and follow up with PCP  Elevated LFTs LFT has normalized.  Likely reactive to acute viral infection    Follow up 3 weeks   All questions were answered. The patient knows to call the clinic with any problems, questions or concerns.  Zelphia Cap, MD, PhD [facility] Hematology Oncology 12/22/2023    HISTORY OF PRESENTING ILLNESS:  Nancy [name] 58 y.o. female presents to establish care for  peritoneal carcinomatosis I have reviewed her chart and materials related to her cancer extensively and collaborated history with the patient. 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 Aortic atherosclerosis    11/06/2022 Initial Diagnosis   Primary peritoneal carcinomatosis   -presented to emergency room for evaluation of abdominal pain/bloating, poor oral intake, nonbloody nonbilious vomiting  Patient underwent paracentesis, cytology showed malignant cells. IHC can not be added.    11/14/2022 Imaging   CT chest w contrast  1. Prominent subcentimeter right retrocrural lymph nodes, metastatic disease can not be excluded. 2. Nonspecific ground-glass nodule of the superior portion of the right lower lobe measuring 7 mm. Recommend attention on follow-up. 3. Small left pleural effusion. 4. Partially visualized large volume abdominal ascites and peritoneal thickening, volume of ascites appears increased when compared with the prior CT. 5. Coronary artery calcifications and aortic Atherosclerosis (ICD10-I70.0).   11/18/2022 Procedure   Medi port placement by IR   11/18/2022 Procedure   Omentum biopsy showed  1. Omentum, biopsy,  :   - INVOLVEMENT BY HIGH-GRADE SEROUS CARCINOMA OF GYNECOLOGIC ORIGIN.   Diagnosis Note : The carcinoma is positive for cytokeratin 7, PAX8, WT1, and ER.  Cytokeratin 20 and GATA3 are negative.  P53 is overexpressed.  The morphologic findings in conjunction with the pattern of immunohistochemistry support the  above diagnosis.   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 The report date is 11/25/2022.  The CancerNext-Expanded + RNAinsight gene panel  offered by [employer] and includes sequencing and rearrangement analysis for the following 71  genes: AIP, ALK, APC*, ATM*, AXIN2, BAP1, BARD1, BMPR1A, BRCA1*, BRCA2*, BRIP1*, CDC73, CDH1*,CDK4, CDKN1B, CDKN2A, CHEK2*, CTNNA1, DICER1, FH, FLCN, KIF1B, LZTR1, MAX, MEN1, MET, MLH1*, MSH2*, MSH3, MSH6*, MUTYH*, NF1*, NF2, NTHL1, PALB2*, PHOX2B, PMS2*, POT1, PRKAR1A, PTCH1, PTEN*, RAD51C*, RAD51D*,RB1, RET, SDHA, SDHAF2, SDHB, SDHC, SDHD, SMAD4, SMARCA4, SMARCB1, SMARCE1, STK11, SUFU, TMEM127, TP53*,TSC1, TSC2, VHL; EGFR, EGLN1, HOXB13, KIT, MITF, PDGFRA, POLD1 and POLE (sequencing only); EPCAM and GREM1 (deletion/duplication only).    01/13/2023 Imaging   CT chest abdomen pelvis w contrast showed  Decreasing ascites, trace residual. The areas of omental caking and nodularity along the perineum is decreasing as well with significant residual particularly in the upper anterior abdomen.   Stable small mesenteric and retroperitoneal nodes. Resolved left pleural effusion. No developing new mass lesion. Small left renal angiomyolipoma and upper pole nonobstructing renal stone.   02/20/2023 Surgery    Interval debulking R1 - robotic assisted laparoscopic total hysterectomy, bilateral salpingo-oophorectomy, lysis of adhesions, peritoneal stripping, and omentectomy with stage IIIC high grade serous carcinoma of bilateral ovaries   A. Uterus, bilateral fallopian tubes, and bilateral ovaries, total hysterectomy and bilateral salpingo-oophorectomy: Residual high grade serous carcinoma (1.6 cm) involving bilateral ovaries, left fallopian tube, and uterine serosa.   Additional findings:  Uterus with leiomyomata (up to 1.1 cm) Right fallopian tube with no pathologic diagnosis See synoptic report.   B. Peritoneal nodules, excision: High grade serous carcinoma (up to 0.1 cm).  C. Omentum, omentectomy:  High grade serous carcinoma (0.7 cm). Moderate treatment response (CRS2).     03/04/2023 Cancer Staging   Staging form: Ovary, AJCC 7th Edition - Pathologic stage from 03/04/2023: FIGO Stage IIIC  (cM0) - Signed by Babara Call, MD on 03/13/2023 Stage prefix: Initial diagnosis Laterality: Bilateral   History of sepsis secondary to diabetic foot infection/osteomyelitis status post left BKA done on 09/24/2022,   Discussed the use of AI scribe software for clinical note transcription with the patient, who gave verbal consent to proceed.    Today she is accompanied by significant other.  Patient previously tolerated Olarparib at 250mg  BID dosage with manageable diarrhea with prophylactic imodium.  She currently is off most of her medication including Olarparib after recent hospitalization.   She was recently hospitalized at [facility] for an acute episode of chest pain radiating under the rib cage, near-syncope, vomiting, and profound weakness, which began while shopping and progressed to syncope and inability to ambulate.  She was found to have moderate size pericardial effusion without evidence of tamponade. She also had hypotension.  mildly elevated inflammatory markers -ESR 41, CRP 12  acute liver injury with an initial AST of 177 and ALT 147. Her AST rose to 585 and ALT rose to 748. Her T. bili and alkaline phosphatase were normal   impaired renal function,  Negative EBV PCR, negative CMV PCR, negative Hep B/C  Negative ANA, negative antimitochondrial Ab  Since discharge, she continues to experience significant fatigue, dyspnea on minimal exertion, and generalized weakness, resulting in inability to perform usual activities and requiring a wheelchair for mobility.  She also endorses  headaches, anorexia, and ongoing nausea without weight loss. She attempts to eat small amounts every few hours but is unable to tolerate full meals. She denies current diarrhea but has a history of intermittent diarrhea while on olaparib , previously managed with preemptive loperamide. She denies current symptoms of gastroesophageal reflux.  She reports home blood pressure readings  as high as 200 mmHg systolic and  associated headaches. She reports ongoing palpitations and fast heart rate. She is monitoring her blood pressure at home and increasing oral fluid intake  MEDICAL HISTORY:  Past Medical History:  Diagnosis Date   Abnormal cardiovascular stress test 09/21/2018   Formatting of this note might be different from the original. Lexiscan Myoview 09/16/2018 revealed mild anterior ischemia   Adverse effect of motion 05/10/2014   AKI (acute kidney injury) 10/22/2022   Anxiety    Arthritis    r knee   Asthma    Breast cyst 05/10/2014   CAD (coronary artery disease)    a.) LHC 06/04/2009 at [facility]; non-obstructive CAD. b.) CTA with FFR 10/08/2018: extensive mixed plaque proximal to mid LAD (51-69%); Coronary Ca score 217; FFR 0.71 dPDA, 0.86 mLAD, 0.87 dLCx.   CCF (congestive cardiac failure) (HCC) 06/06/2008   a.) 30% EF. b.) TTE 06/03/2011: EF >55%; triv MR. c.) TTE 04/27/2018: EF 55%; mild LVH; triv PR, mild MR/TR; G1DD.   Cellulitis of left lower extremity 09/18/2022   Chest pain with high risk for cardiac etiology 07/02/2016   Chronic use of opiate drug for therapeutic purpose 02/13/2022   Formatting of this note might be different from the original. [facility] Contract signed on 04/18/16 & updated 03/04/17; UDS done on 04/18/16.   Complication of anesthesia    Diabetic foot infection (HCC) 03/13/2015   Diabetic foot ulcer (HCC) 02/08/2021   Diabetic ulcer of left heel associated with type 2 diabetes mellitus (HCC) 02/08/2021   Eczema    Family history of adverse reaction to anesthesia    a.) PONV in mother and grandmother   Gas gangrene (HCC) 09/18/2022   GERD (gastroesophageal reflux disease) 05/10/2014   History of kidney stones    History of MSSA bacteremia due to osteomyelitis L foot 09/18/22 10/22/2022   HLD (hyperlipidemia)    Hyponatremia 03/13/2015   Migraines    Motion sickness    MSSA bacteremia 09/20/2022   Osteomyelitis (HCC) 09/18/2022   Other acute osteomyelitis, left ankle and  foot (HCC) 02/13/2022   Panic attacks    Peripheral edema 04/27/2018   Pneumonia    PONV (postoperative nausea and vomiting)    Sepsis secondary to diabetic foot infection 03/13/2015   Sepsis secondary to diabetic foot infection 03/13/2015   T2DM (type 2 diabetes mellitus) (HCC)    Takotsubo cardiomyopathy / transient apical balooning syndrome / stress-induced cardiomyopathy 05/10/2014   Unspecified essential hypertension     SURGICAL HISTORY: Past Surgical History:  Procedure Laterality Date   AMPUTATION Left 09/19/2022   Procedure: AMPUTATION BELOW KNEE;  Surgeon: Jama Cordella MATSU, MD;  Location: [facility];  Service: Vascular;  Laterality: Left;   AMPUTATION Left 09/24/2022   Procedure: AMPUTATION BELOW KNEE WITH WOUND CLOSURE;  Surgeon: Jama Cordella MATSU, MD;  Location: [facility];  Service: Vascular;  Laterality: Left;   AMPUTATION TOE Left 03/16/2015   Procedure: left fifth toe amputation with incision and drainage;  Surgeon: Eva Gay, DPM;  Location: [facility];  Service: Podiatry;  Laterality: Left;   APPLICATION OF WOUND VAC Left 02/14/2021   Procedure: APPLICATION OF WOUND VAC;  Surgeon: Gay Eva, DPM;  Location: [facility];  Service: Podiatry;  Laterality: Left;   APPLICATION OF WOUND VAC Left 04/16/2021   Procedure: APPLICATION OF WOUND VAC;  Surgeon: Lowery Estefana RAMAN, DO;  Location:  SURGERY CENTER;  Service: Plastics;  Laterality: Left;   CARDIAC CATHETERIZATION Left 06/04/2009   Procedure:  CARDIAC CATHETERIZATION; Location: [facility]   CHOLECYSTECTOMY     DEBRIDEMENT AND CLOSURE WOUND Left 04/16/2021   Procedure: DEBRIDEMENT AND CLOSURE WOUND;  Surgeon: Lowery Estefana RAMAN, DO;  Location: [facility];  Service: Plastics;  Laterality: Left;  1 hour   IR IMAGING GUIDED PORT INSERTION  11/18/2022   IRRIGATION AND DEBRIDEMENT FOOT Left 02/08/2021   Procedure: IRRIGATION AND DEBRIDEMENT FOOT - LFT HEEL ULCER;  Surgeon: Ashley Soulier, DPM;  Location: [facility];  Service: Podiatry;  Laterality: Left;   IRRIGATION AND DEBRIDEMENT FOOT Left 02/14/2021   Procedure: IRRIGATION AND DEBRIDEMENT LEFT HEEL;  Surgeon: Ashley Soulier, DPM;  Location: [facility];  Service: Podiatry;  Laterality: Left;   kidney stone removal     KNEE ARTHROSCOPY W/ MENISCAL REPAIR     NOSE SURGERY  01/07/1989   due to fracture   TEE WITHOUT CARDIOVERSION N/A 09/26/2022   Procedure: TRANSESOPHAGEAL ECHOCARDIOGRAM;  Surgeon: Dewane Shiner, DO;  Location: [facility];  Service: Cardiovascular;  Laterality: N/A;   TOE AMPUTATION     second toe   TONSILLECTOMY     TOTAL KNEE ARTHROPLASTY  01/11/2011   Procedure: TOTAL KNEE ARTHROPLASTY;  Surgeon: Norleen LITTIE Gavel;  Location: [facility];  Service: Orthopedics;  Laterality: Right;  COMPUTER ASSISTED TOTAL KNEE REPLACEMENT    SOCIAL HISTORY: Social History   Socioeconomic History   Marital status: Married    Spouse name: Not on file   Number of children: Not on file   Years of education: Not on file   Highest education level: Not on file  Occupational History   Not on file  Tobacco Use   Smoking status: Former    Current packs/day: 0.00    Types: Cigarettes    Quit date: 03/12/1985    Years since quitting: 38.8   Smokeless tobacco: Never  Vaping Use   Vaping status: Never Used  Substance and Sexual Activity   Alcohol use: No   Drug use: No   Sexual activity: Yes    Birth control/protection: Post-menopausal  Other Topics Concern   Not on file  Social History Narrative   Wife, Barnie, at bedside. One indoor pet, dog.   Social Drivers of Health   Tobacco Use: Medium Risk (12/22/2023)   Patient History    Smoking Tobacco Use: Former    Smokeless Tobacco Use: Never    Passive Exposure: Not on file  Financial Resource Strain: Medium Risk (12/01/2023)   Received from [facility]   Overall Financial Resource Strain (CARDIA)    Difficulty of Paying Living Expenses: Somewhat  hard  Food Insecurity: Unknown (12/01/2023)   Received from [facility]   Epic    Worried About Running Out of Food in the Last Year: Not on file    Within the past 12 months, the food you bought just didn't last and you didn't have money to get more.: Never true  Transportation Needs: Unmet Transportation Needs (12/01/2023)   Received from [facility] - Transportation    In the past 12 months, has lack of transportation kept you from medical appointments or from getting medications?: Yes    Lack of Transportation (Non-Medical): Yes  Physical Activity: Not on file  Stress: Not on file  Social Connections: Not on file  Intimate Partner Violence: Not At Risk (10/22/2022)   Humiliation, Afraid, Rape, and Kick questionnaire    Fear of Current or Ex-Partner: No  Emotionally Abused: No    Physically Abused: No    Sexually Abused: No  Depression (PHQ2-9): Low Risk (11/04/2023)   Depression (PHQ2-9)    PHQ-2 Score: 1  Alcohol Screen: Not on file  Housing: Low Risk  (12/01/2023)   Received from [facility]   Epic    In the last 12 months, was there a time when you were not able to pay the mortgage or rent on time?: No    In the past 12 months, how many times have you moved where you were living?: 0    At any time in the past 12 months, were you homeless or living in a shelter (including now)?: No  Utilities: Not At Risk (12/01/2023)   Received from [facility]   Epic    In the past 12 months has the electric, gas, oil, or water  company threatened to shut off services in your home?: No  Health Literacy: Not on file    FAMILY HISTORY: Family History  Problem Relation Age of Onset   Transient ischemic attack Mother    Asthma Mother    Hypertension Mother    Lung cancer Father 93   Hypertension Father    Asthma Sister    Anxiety disorder Sister    Asthma Sister    Hypertension Brother    Ovarian  cancer Maternal Aunt 54   Melanoma Maternal Uncle        dx 83s   Lung cancer Paternal Aunt 71   Stomach cancer Maternal Grandmother    Colon cancer Maternal Grandmother    Lymphoma Maternal Grandmother    Melanoma Maternal Grandfather 72       sun exposure   Lung cancer Paternal Grandmother 74   Kidney cancer Paternal Grandfather        dx early   Crohn's disease Other    GER disease Other    Breast cancer Neg Hx     ALLERGIES:  is allergic to shellfish allergy, codeine, morphine  and codeine, rosuvastatin, metformin  and related, percocet [oxycodone -acetaminophen ], sulfa antibiotics, and theophyllines.  MEDICATIONS:  Current Outpatient Medications  Medication Sig Dispense Refill   carvedilol  (COREG ) 6.25 MG tablet TAKE 1 TABLET BY MOUTH TWICE DAILY 180 tablet 1   desvenlafaxine  (PRISTIQ ) 100 MG 24 hr tablet Take 1 tablet (100 mg total) by mouth daily. 90 tablet 3   lidocaine -prilocaine  (EMLA ) cream Apply to affected area once 30 g 3   montelukast  (SINGULAIR ) 10 MG tablet Take 1 tablet (10 mg total) by mouth daily. 90 tablet 1   pantoprazole  (PROTONIX ) 40 MG tablet Take 1 tablet (40 mg total) by mouth daily. 90 tablet 3   acidophilus (RISAQUAD) CAPS capsule Take 1 capsule by mouth daily. (Patient not taking: Reported on 12/22/2023)     albuterol  (PROVENTIL  HFA;VENTOLIN  HFA) 108 (90 Base) MCG/ACT inhaler Inhale 2 puffs into the lungs every 4 (four) hours as needed for wheezing or shortness of breath. Reported on 03/29/2015 (Patient not taking: Reported on 12/22/2023) 1 Inhaler 3   albuterol  (PROVENTIL ) (2.5 MG/3ML) 0.083% nebulizer solution Take 2.5 mg by nebulization as needed. (Patient not taking: Reported on 12/22/2023)     ALPRAZolam  (XANAX ) 0.5 MG tablet Take 0.5-1 tablets (0.25-0.5 mg total) by mouth 2 (two) times daily as needed for anxiety. Take 1/2 to 1 tablet by mouth as needed for panic attacks (Patient not taking: Reported on 12/22/2023) 60 tablet 2   aspirin  EC 81 MG tablet  Take 81 mg by  mouth daily. (Patient not taking: Reported on 12/22/2023)     Continuous Glucose Sensor (FREESTYLE LIBRE 2 SENSOR) MISC 1 each by Does not apply route every 14 (fourteen) days. (Patient not taking: Reported on 12/22/2023) 2 each 3   dicyclomine  (BENTYL ) 10 MG capsule Take 1 capsule (10 mg total) by mouth 4 (four) times daily -  before meals and at bedtime. (Patient not taking: Reported on 12/22/2023) 60 capsule 1   diphenoxylate -atropine  (LOMOTIL ) 2.5-0.025 MG tablet Take 1 tablet by mouth 4 (four) times daily as needed for diarrhea or loose stools. (Patient not taking: Reported on 12/22/2023) 30 tablet 0   Ensure Max Protein (ENSURE MAX PROTEIN) LIQD Take 330 mLs (11 oz total) by mouth 2 (two) times daily. (Patient not taking: Reported on 12/22/2023)     fenofibrate  54 MG tablet TAKE 1 TABLET BY MOUTH EVERY DAY (Patient not taking: Reported on 12/22/2023) 30 tablet 11   gabapentin  (NEURONTIN ) 300 MG capsule TAKE 1 CAPSULE BY MOUTH EVERY NIGHT AT BEDTIME (Patient not taking: Reported on 12/22/2023) 60 capsule 0   HYDROmorphone  (DILAUDID ) 2 MG tablet Take 1 tablet (2 mg total) by mouth every 4 (four) hours as needed for severe pain (pain score 7-10). (Patient not taking: Reported on 12/22/2023) 60 tablet 0   ibuprofen (ADVIL) 800 MG tablet TAKE 1 TABLET BY MOUTH EVERY 8 HOURS AS NEEDED (Patient not taking: Reported on 12/22/2023) 90 tablet 3   insulin  NPH-regular Human (70-30) 100 UNIT/ML injection Inject 70 Units into the skin 2 (two) times daily with a meal. (Patient not taking: Reported on 12/22/2023) 50 mL 10   lisinopril  (ZESTRIL ) 10 MG tablet Take 1 tablet (10 mg total) by mouth every morning AND 0.5 tablets (5 mg total) at bedtime. (Patient not taking: No sig reported) 145 tablet 3   lisinopril  (ZESTRIL ) 5 MG tablet TAKE 1 TABLET BY MOUTH TWICE DAILY (Patient not taking: Reported on 12/22/2023) 180 tablet 1   metFORMIN  (GLUCOPHAGE ) 500 MG tablet TAKE 2 TABLET BY MOUTH IN THE MORNING  AND 3 TABLET BY MOUTH IN THE EVENING (Patient not taking: Reported on 12/22/2023) 450 tablet 1   methocarbamol  (ROBAXIN ) 500 MG tablet Take 1 tablet (500 mg total) by mouth every 8 (eight) hours as needed for muscle spasms. (Patient not taking: Reported on 12/22/2023) 30 tablet 0   mirtazapine  (REMERON ) 7.5 MG tablet TAKE 1 TABLET BY MOUTH EVERY NIGHT AT BEDTIME (Patient not taking: Reported on 12/22/2023) 90 tablet 2   Multiple Vitamin (MULTIVITAMIN WITH MINERALS) TABS tablet Take 1 tablet by mouth daily. (Patient not taking: Reported on 12/22/2023) 30 tablet 2   naloxone  (NARCAN ) nasal spray 4 mg/0.1 mL SPRAY 1 SPRAY INTO ONE NOSTRIL AS DIRECTED FOR OPIOID OVERDOSE (TURN PERSON ON SIDE AFTER DOSE. IF NO RESPONSE IN 2-3 MINUTES OR PERSON RESPONDS BUT RELAPSES, REPEAT USING A NEW SPRAY DEVICE AND SPRAY INTO THE OTHER NOSTRIL. CALL 911 AFTER USE.) * EMERGENCY USE ONLY * (Patient not taking: Reported on 12/22/2023) 1 each 0   nystatin  (MYCOSTATIN /NYSTOP ) powder APPLY TOPICALLY TWICE DAILY AS NEEDED (Patient not taking: Reported on 12/22/2023) 30 g 3   nystatin  ointment (MYCOSTATIN ) Apply 1 Application topically 2 (two) times daily. (Patient not taking: Reported on 12/22/2023) 30 g 0   olaparib  (LYNPARZA ) 100 MG tablet Take 1 tablet (100 mg total) by mouth 2 (two) times daily. Swallow whole. May take with food to decrease nausea and vomiting. Take with 150mg  tablet (Patient not taking: Reported on 12/22/2023) 60 tablet 0  olaparib  (LYNPARZA ) 150 MG tablet Take 1 tablet (150 mg total) by mouth 2 (two) times daily. Swallow whole. May take with food to decrease nausea and vomiting. Take with 100 mg tablet (Patient not taking: Reported on 12/22/2023) 60 tablet 0   ondansetron  (ZOFRAN ) 8 MG tablet Take 1 tablet (8 mg total) by mouth every 8 (eight) hours as needed for nausea or vomiting. Start on the third day after carboplatin . (Patient not taking: Reported on 12/22/2023) 30 tablet 1   ondansetron  (ZOFRAN -ODT) 8  MG disintegrating tablet Take 1 tablet (8 mg total) by mouth every 8 (eight) hours as needed for nausea or vomiting. (Patient not taking: Reported on 12/22/2023) 90 tablet 3   pravastatin  (PRAVACHOL ) 40 MG tablet TAKE 1 TABLET BY MOUTH EVERY DAY (Patient not taking: Reported on 12/22/2023) 30 tablet 5   prochlorperazine  (COMPAZINE ) 10 MG tablet Take 1 tablet (10 mg total) by mouth every 6 (six) hours as needed for nausea or vomiting. (Patient not taking: Reported on 12/22/2023) 90 tablet 1   tirzepatide  (MOUNJARO ) 7.5 MG/0.5ML Pen Inject 7.5 mg into the skin once a week. (Patient not taking: Reported on 12/22/2023) 2 mL 4   Ubrogepant  (UBRELVY ) 100 MG TABS Take 1 tablet (100 mg total) by mouth as needed (migraine). (Patient not taking: Reported on 12/22/2023) 12 tablet 1   zinc  sulfate 220 (50 Zn) MG capsule Take 1 capsule (220 mg total) by mouth daily. (Patient not taking: Reported on 12/22/2023) 30 capsule 0   No current facility-administered medications for this visit.    Review of Systems  Constitutional:  Positive for appetite change and fatigue. Negative for chills, fever and unexpected weight change.  HENT:   Negative for hearing loss and voice change.   Eyes:  Negative for eye problems.  Respiratory:  Positive for shortness of breath. Negative for chest tightness and cough.   Cardiovascular:  Positive for palpitations. Negative for chest pain and leg swelling.  Gastrointestinal:  Positive for nausea. Negative for abdominal distention, abdominal pain, blood in stool and diarrhea.  Endocrine: Negative for hot flashes.  Genitourinary:  Negative for difficulty urinating and frequency.   Musculoskeletal:  Negative for arthralgias and neck pain.       S/p left  BKA   Skin:  Negative for itching and rash.  Neurological:  Positive for numbness. Negative for extremity weakness.  Hematological:  Negative for adenopathy.  Psychiatric/Behavioral:  Negative for confusion.      PHYSICAL  EXAMINATION: ECOG PERFORMANCE STATUS: 1 - Symptomatic but completely ambulatory  Vitals:   12/22/23 1316 12/22/23 1336  BP: (!) 158/81 (!) 149/74  Pulse: (!) 103   Resp: 18   Temp: 99.2 F (37.3 C)   SpO2: 97%    Filed Weights   12/22/23 1316  Weight: 289 lb (131.1 kg)    Physical Exam Constitutional:      General: She is not in acute distress.    Appearance: She is obese. She is not diaphoretic.  HENT:     Head: Normocephalic and atraumatic.     Mouth/Throat:     Pharynx: No oropharyngeal exudate.  Eyes:     General: No scleral icterus. Cardiovascular:     Rate and Rhythm: Regular rhythm. Tachycardia present.  Pulmonary:     Effort: Pulmonary effort is normal. No respiratory distress.     Breath sounds: Normal breath sounds.  Abdominal:     General: There is no distension.     Palpations: Abdomen is soft.  Tenderness: There is no abdominal tenderness.  Musculoskeletal:        General: Normal range of motion.     Cervical back: Normal range of motion and neck supple.     Comments: S/p left BKA  Skin:    General: Skin is warm and dry.     Findings: No erythema.  Neurological:     Mental Status: She is alert and oriented to person, place, and time. Mental status is at baseline.     Cranial Nerves: No cranial nerve deficit.     Motor: No abnormal muscle tone.  Psychiatric:        Mood and Affect: Affect normal.      LABORATORY DATA:  I have reviewed the data as listed    Latest Ref Rng & Units 12/22/2023    1:08 PM 11/04/2023   12:40 PM 10/01/2023    9:45 AM  CBC  WBC 4.0 - 10.5 K/uL 8.4  10.2  7.7   Hemoglobin 12.0 - 15.0 g/dL 9.9  88.9  88.4   Hematocrit 36.0 - 46.0 % 31.9  33.4  35.5   Platelets 150 - 400 K/uL 499  252  237       Latest Ref Rng & Units 12/22/2023    1:08 PM 11/04/2023   12:40 PM 10/01/2023    9:45 AM  CMP  Glucose 70 - 99 mg/dL 836  841  769   BUN 6 - 20 mg/dL 23  30  29    Creatinine 0.44 - 1.00 mg/dL 9.09  8.76  8.97    Sodium 135 - 145 mmol/L 137  137  134   Potassium 3.5 - 5.1 mmol/L 4.4  4.3  4.7   Chloride 98 - 111 mmol/L 102  102  100   CO2 22 - 32 mmol/L 23  24  25    Calcium 8.9 - 10.3 mg/dL 9.4  9.0  9.3   Total Protein 6.5 - 8.1 g/dL 7.5  7.0  7.4   Total Bilirubin 0.0 - 1.2 mg/dL 0.4  0.4  0.5   Alkaline Phos 38 - 126 U/L 92  46  45   AST 15 - 41 U/L 21  22  21    ALT 0 - 44 U/L 31  16  18       RADIOGRAPHIC STUDIES: I have personally reviewed the radiological images as listed and agreed with the findings in the report. DG PAIN CLINIC C-ARM 1-60 MIN NO REPORT Result Date: 11/26/2023 Fluoro was used, but no Radiologist interpretation will be provided. Please refer to NOTES tab for provider progress note.

## 2023-12-22 NOTE — Assessment & Plan Note (Addendum)
 History of Takotsubo cardiomyopathy Incidental findings of pleural effusion, this is likely due to acute viral infection.  Less likely due to Olaparib  Follow up with cardiology

## 2023-12-22 NOTE — Assessment & Plan Note (Addendum)
 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 Post treatment CT showed excellent response. S/p Debulking R1 on 02/20/2023, s/p 4 cycles of adjuvant carboplatin  taxol  Currently on Olaparib  maintenance.  Labs are reviewed and discussed with patient. She did not tolerate Olaparib  300mg  BID due to severe diarrhea.   Previously tolerated 250mg  twice daily. Hold off for now to allow full recovery from acute symptoms.  Aspirin  81mg  daily for thrombosis prophylaxis. .  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

## 2023-12-23 ENCOUNTER — Other Ambulatory Visit: Payer: Self-pay

## 2023-12-23 ENCOUNTER — Telehealth: Payer: Self-pay | Admitting: *Deleted

## 2023-12-23 DIAGNOSIS — C569 Malignant neoplasm of unspecified ovary: Secondary | ICD-10-CM

## 2023-12-23 LAB — CA 125: Cancer Antigen (CA) 125: 78.1 U/mL — ABNORMAL HIGH (ref 0.0–38.1)

## 2023-12-23 NOTE — Telephone Encounter (Deleted)
 Pt scheduled for Bx on Wed 12/24 @ 8:30, arrive 7:30

## 2023-12-23 NOTE — Telephone Encounter (Signed)
 Patient called and is concerned about her rising ca125. She is requesting a return phone call to discuss plan of care and results.

## 2023-12-23 NOTE — Telephone Encounter (Signed)
 Patient left VM to inform us  that she seen her cardiologist at [facility]  and they advised her to slowly reintroduce the carvedilol  and then the lisinopril . Patient's BP had still been running high before she went to cardio. She canceled her appt with Alan for 12/24 due to she wasn't going to be able to make it with the holiday and transportation. She just wanted to let Alan know what was going on for now. New appt made for 01/13/24.

## 2023-12-23 NOTE — Telephone Encounter (Signed)
 Spoke to pt and informed her that Dr. Babara would like to repeat CA125 in 1 week and CT scan in 2 weeks. Pt informed of plan.   Lab scheduled for 12/23.   Pt will Need CT at the end of Dec.   Kristi- Dr. Babara would like gyn appt to be moved sooner, is this possible?

## 2023-12-24 ENCOUNTER — Ambulatory Visit: Admitting: Nurse Practitioner

## 2023-12-24 ENCOUNTER — Telehealth: Payer: Self-pay | Admitting: Oncology

## 2023-12-24 ENCOUNTER — Emergency Department

## 2023-12-24 ENCOUNTER — Ambulatory Visit
Admission: RE | Admit: 2023-12-24 | Discharge: 2023-12-24 | Disposition: A | Discharge status: Home / Self Care | Source: Ambulatory Visit | Attending: Oncology | Admitting: Oncology

## 2023-12-24 ENCOUNTER — Other Ambulatory Visit: Payer: Self-pay

## 2023-12-24 ENCOUNTER — Encounter: Payer: Self-pay | Admitting: Oncology

## 2023-12-24 ENCOUNTER — Telehealth: Payer: Self-pay | Admitting: *Deleted

## 2023-12-24 ENCOUNTER — Other Ambulatory Visit

## 2023-12-24 ENCOUNTER — Inpatient Hospital Stay
Admission: EM | Admit: 2023-12-24 | Discharge: 2023-12-30 | DRG: 315 | Disposition: A | Discharge status: Home / Self Care

## 2023-12-24 DIAGNOSIS — R112 Nausea with vomiting, unspecified: Secondary | ICD-10-CM | POA: Insufficient documentation

## 2023-12-24 DIAGNOSIS — I3139 Other pericardial effusion (noninflammatory): Principal | ICD-10-CM | POA: Diagnosis present

## 2023-12-24 DIAGNOSIS — E871 Hypo-osmolality and hyponatremia: Secondary | ICD-10-CM | POA: Diagnosis present

## 2023-12-24 DIAGNOSIS — Z818 Family history of other mental and behavioral disorders: Secondary | ICD-10-CM

## 2023-12-24 DIAGNOSIS — Z90722 Acquired absence of ovaries, bilateral: Secondary | ICD-10-CM

## 2023-12-24 DIAGNOSIS — J449 Chronic obstructive pulmonary disease, unspecified: Secondary | ICD-10-CM | POA: Diagnosis present

## 2023-12-24 DIAGNOSIS — K521 Toxic gastroenteritis and colitis: Secondary | ICD-10-CM | POA: Diagnosis present

## 2023-12-24 DIAGNOSIS — E785 Hyperlipidemia, unspecified: Secondary | ICD-10-CM | POA: Diagnosis present

## 2023-12-24 DIAGNOSIS — I10 Essential (primary) hypertension: Secondary | ICD-10-CM | POA: Diagnosis present

## 2023-12-24 DIAGNOSIS — Z882 Allergy status to sulfonamides status: Secondary | ICD-10-CM

## 2023-12-24 DIAGNOSIS — R0602 Shortness of breath: Secondary | ICD-10-CM

## 2023-12-24 DIAGNOSIS — I700 Atherosclerosis of aorta: Secondary | ICD-10-CM | POA: Diagnosis present

## 2023-12-24 DIAGNOSIS — Z825 Family history of asthma and other chronic lower respiratory diseases: Secondary | ICD-10-CM

## 2023-12-24 DIAGNOSIS — C569 Malignant neoplasm of unspecified ovary: Secondary | ICD-10-CM | POA: Diagnosis present

## 2023-12-24 DIAGNOSIS — R079 Chest pain, unspecified: Secondary | ICD-10-CM

## 2023-12-24 DIAGNOSIS — J45909 Unspecified asthma, uncomplicated: Secondary | ICD-10-CM | POA: Diagnosis present

## 2023-12-24 DIAGNOSIS — J4489 Other specified chronic obstructive pulmonary disease: Secondary | ICD-10-CM | POA: Diagnosis present

## 2023-12-24 DIAGNOSIS — R1085 Abdominal pain of multiple sites: Secondary | ICD-10-CM

## 2023-12-24 DIAGNOSIS — Z87442 Personal history of urinary calculi: Secondary | ICD-10-CM

## 2023-12-24 DIAGNOSIS — Z801 Family history of malignant neoplasm of trachea, bronchus and lung: Secondary | ICD-10-CM

## 2023-12-24 DIAGNOSIS — Z808 Family history of malignant neoplasm of other organs or systems: Secondary | ICD-10-CM

## 2023-12-24 DIAGNOSIS — I2489 Other forms of acute ischemic heart disease: Secondary | ICD-10-CM | POA: Diagnosis present

## 2023-12-24 DIAGNOSIS — Z8249 Family history of ischemic heart disease and other diseases of the circulatory system: Secondary | ICD-10-CM

## 2023-12-24 DIAGNOSIS — R1010 Upper abdominal pain, unspecified: Secondary | ICD-10-CM | POA: Insufficient documentation

## 2023-12-24 DIAGNOSIS — K3580 Unspecified acute appendicitis: Secondary | ICD-10-CM | POA: Diagnosis present

## 2023-12-24 DIAGNOSIS — Z90710 Acquired absence of both cervix and uterus: Secondary | ICD-10-CM

## 2023-12-24 DIAGNOSIS — Z87891 Personal history of nicotine dependence: Secondary | ICD-10-CM

## 2023-12-24 DIAGNOSIS — F411 Generalized anxiety disorder: Secondary | ICD-10-CM | POA: Diagnosis present

## 2023-12-24 DIAGNOSIS — Z89512 Acquired absence of left leg below knee: Secondary | ICD-10-CM

## 2023-12-24 DIAGNOSIS — G43009 Migraine without aura, not intractable, without status migrainosus: Secondary | ICD-10-CM | POA: Diagnosis present

## 2023-12-24 DIAGNOSIS — Z59868 Other specified financial insecurity: Secondary | ICD-10-CM

## 2023-12-24 DIAGNOSIS — Z96651 Presence of right artificial knee joint: Secondary | ICD-10-CM | POA: Diagnosis present

## 2023-12-24 DIAGNOSIS — E119 Type 2 diabetes mellitus without complications: Secondary | ICD-10-CM

## 2023-12-24 DIAGNOSIS — Z8619 Personal history of other infectious and parasitic diseases: Secondary | ICD-10-CM

## 2023-12-24 DIAGNOSIS — Z5982 Transportation insecurity: Secondary | ICD-10-CM

## 2023-12-24 DIAGNOSIS — Z79899 Other long term (current) drug therapy: Secondary | ICD-10-CM

## 2023-12-24 DIAGNOSIS — I110 Hypertensive heart disease with heart failure: Secondary | ICD-10-CM | POA: Diagnosis present

## 2023-12-24 DIAGNOSIS — E66813 Obesity, class 3: Secondary | ICD-10-CM | POA: Diagnosis present

## 2023-12-24 DIAGNOSIS — Z8051 Family history of malignant neoplasm of kidney: Secondary | ICD-10-CM

## 2023-12-24 DIAGNOSIS — I2510 Atherosclerotic heart disease of native coronary artery without angina pectoris: Secondary | ICD-10-CM | POA: Diagnosis present

## 2023-12-24 DIAGNOSIS — Z885 Allergy status to narcotic agent status: Secondary | ICD-10-CM

## 2023-12-24 DIAGNOSIS — Z888 Allergy status to other drugs, medicaments and biological substances status: Secondary | ICD-10-CM

## 2023-12-24 DIAGNOSIS — Z800 Family history of malignant neoplasm of digestive organs: Secondary | ICD-10-CM

## 2023-12-24 DIAGNOSIS — D539 Nutritional anemia, unspecified: Secondary | ICD-10-CM | POA: Diagnosis present

## 2023-12-24 DIAGNOSIS — Z923 Personal history of irradiation: Secondary | ICD-10-CM

## 2023-12-24 DIAGNOSIS — Z9079 Acquired absence of other genital organ(s): Secondary | ICD-10-CM

## 2023-12-24 DIAGNOSIS — I309 Acute pericarditis, unspecified: Principal | ICD-10-CM | POA: Diagnosis present

## 2023-12-24 DIAGNOSIS — Z807 Family history of other malignant neoplasms of lymphoid, hematopoietic and related tissues: Secondary | ICD-10-CM

## 2023-12-24 DIAGNOSIS — I428 Other cardiomyopathies: Secondary | ICD-10-CM | POA: Diagnosis present

## 2023-12-24 DIAGNOSIS — Z91013 Allergy to seafood: Secondary | ICD-10-CM

## 2023-12-24 DIAGNOSIS — I4891 Unspecified atrial fibrillation: Secondary | ICD-10-CM | POA: Diagnosis present

## 2023-12-24 DIAGNOSIS — Z8041 Family history of malignant neoplasm of ovary: Secondary | ICD-10-CM

## 2023-12-24 DIAGNOSIS — T451X5A Adverse effect of antineoplastic and immunosuppressive drugs, initial encounter: Secondary | ICD-10-CM | POA: Diagnosis present

## 2023-12-24 DIAGNOSIS — E1140 Type 2 diabetes mellitus with diabetic neuropathy, unspecified: Secondary | ICD-10-CM | POA: Diagnosis present

## 2023-12-24 DIAGNOSIS — Z7901 Long term (current) use of anticoagulants: Secondary | ICD-10-CM

## 2023-12-24 DIAGNOSIS — Z6841 Body Mass Index (BMI) 40.0 and over, adult: Secondary | ICD-10-CM

## 2023-12-24 DIAGNOSIS — Z9049 Acquired absence of other specified parts of digestive tract: Secondary | ICD-10-CM

## 2023-12-24 DIAGNOSIS — R000 Tachycardia, unspecified: Secondary | ICD-10-CM | POA: Diagnosis present

## 2023-12-24 LAB — HEPATIC FUNCTION PANEL
ALT: 31 U/L (ref 0–44)
AST: 19 U/L (ref 15–41)
Albumin: 3.7 g/dL (ref 3.5–5.0)
Alkaline Phosphatase: 99 U/L (ref 38–126)
Bilirubin, Direct: 0.2 mg/dL (ref 0.0–0.2)
Indirect Bilirubin: 0.2 mg/dL — ABNORMAL LOW (ref 0.3–0.9)
Total Bilirubin: 0.5 mg/dL (ref 0.0–1.2)
Total Protein: 6.9 g/dL (ref 6.5–8.1)

## 2023-12-24 LAB — BASIC METABOLIC PANEL WITH GFR
Anion gap: 13 (ref 5–15)
BUN: 27 mg/dL — ABNORMAL HIGH (ref 6–20)
CO2: 23 mmol/L (ref 22–32)
Calcium: 8.8 mg/dL — ABNORMAL LOW (ref 8.9–10.3)
Chloride: 96 mmol/L — ABNORMAL LOW (ref 98–111)
Creatinine, Ser: 0.88 mg/dL (ref 0.44–1.00)
GFR, Estimated: >60 mL/min (ref >60)
Glucose, Bld: 188 mg/dL — ABNORMAL HIGH (ref 70–99)
Potassium: 4.4 mmol/L (ref 3.5–5.1)
Sodium: 131 mmol/L — ABNORMAL LOW (ref 135–145)

## 2023-12-24 LAB — CBC
HCT: 32 % — ABNORMAL LOW (ref 36.0–46.0)
Hemoglobin: 10 g/dL — ABNORMAL LOW (ref 12.0–15.0)
MCH: 32.1 pg (ref 26.0–34.0)
MCHC: 31.3 g/dL (ref 30.0–36.0)
MCV: 102.6 fL — ABNORMAL HIGH (ref 80.0–100.0)
Platelets: 492 K/uL — ABNORMAL HIGH (ref 150–400)
RBC: 3.12 MIL/uL — ABNORMAL LOW (ref 3.87–5.11)
RDW: 16.7 % — ABNORMAL HIGH (ref 11.5–15.5)
WBC: 9.6 K/uL (ref 4.0–10.5)
nRBC: 0 % (ref 0.0–0.2)

## 2023-12-24 LAB — TROPONIN T, HIGH SENSITIVITY
Troponin T High Sensitivity: 19 ng/L (ref 0–19)
Troponin T High Sensitivity: 20 ng/L — ABNORMAL HIGH (ref 0–19)

## 2023-12-24 LAB — PRO BRAIN NATRIURETIC PEPTIDE: Pro Brain Natriuretic Peptide: 211 pg/mL (ref <300.0)

## 2023-12-24 LAB — CBG MONITORING, ED: Glucose-Capillary: 135 mg/dL — ABNORMAL HIGH (ref 70–99)

## 2023-12-24 MED ORDER — HYDROMORPHONE HCL 1 MG/ML IJ SOLN
0.5000 mg | INTRAMUSCULAR | Status: DC | PRN
  Administered 2023-12-24: 23:00:00 0.5 mg via INTRAVENOUS
  Administered 2023-12-25 (×2): 1 mg via INTRAVENOUS
  Filled 2023-12-24 (×3): qty 1

## 2023-12-24 MED ORDER — ALBUTEROL SULFATE (2.5 MG/3ML) 0.083% IN NEBU: 2.5000 mg | INHALATION_SOLUTION | RESPIRATORY_TRACT | Status: DC | PRN

## 2023-12-24 MED ORDER — COLCHICINE 0.6 MG PO TABS
0.6000 mg | ORAL_TABLET | Freq: Two times a day (BID) | ORAL | Status: DC
  Administered 2023-12-24 – 2023-12-26 (×3): 0.6 mg via ORAL
  Filled 2023-12-24 (×7): qty 1

## 2023-12-24 MED ORDER — ACETAMINOPHEN 650 MG RE SUPP: 650.0000 mg | Freq: Four times a day (QID) | RECTAL | Status: DC | PRN

## 2023-12-24 MED ORDER — INSULIN ASPART 100 UNIT/ML IJ SOLN
0.0000 [IU] | Freq: Three times a day (TID) | INTRAMUSCULAR | Status: DC
  Administered 2023-12-25: 09:00:00 3 [IU] via SUBCUTANEOUS
  Administered 2023-12-25 (×2): 5 [IU] via SUBCUTANEOUS
  Administered 2023-12-26: 3 [IU] via SUBCUTANEOUS
  Administered 2023-12-26: 2 [IU] via SUBCUTANEOUS
  Administered 2023-12-26: 3 [IU] via SUBCUTANEOUS
  Administered 2023-12-27: 5 [IU] via SUBCUTANEOUS
  Administered 2023-12-27: 3 [IU] via SUBCUTANEOUS
  Administered 2023-12-27: 2 [IU] via SUBCUTANEOUS
  Administered 2023-12-28 (×2): 3 [IU] via SUBCUTANEOUS
  Administered 2023-12-29 (×2): 5 [IU] via SUBCUTANEOUS
  Filled 2023-12-24: qty 2
  Filled 2023-12-24: qty 5
  Filled 2023-12-24: qty 3
  Filled 2023-12-24: qty 5
  Filled 2023-12-24: qty 3
  Filled 2023-12-24: qty 2
  Filled 2023-12-24: qty 3
  Filled 2023-12-24: qty 5
  Filled 2023-12-24 (×3): qty 3
  Filled 2023-12-24 (×2): qty 5

## 2023-12-24 MED ORDER — DIPHENOXYLATE-ATROPINE 2.5-0.025 MG PO TABS: 1.0000 | ORAL_TABLET | Freq: Four times a day (QID) | ORAL | Status: DC | PRN

## 2023-12-24 MED ORDER — ONDANSETRON HCL 4 MG/2ML IJ SOLN
4.0000 mg | Freq: Four times a day (QID) | INTRAMUSCULAR | Status: DC | PRN
  Administered 2023-12-25 – 2023-12-27 (×5): 4 mg via INTRAVENOUS
  Filled 2023-12-24 (×5): qty 2

## 2023-12-24 MED ORDER — PANTOPRAZOLE SODIUM 40 MG PO TBEC
40.0000 mg | DELAYED_RELEASE_TABLET | Freq: Every day | ORAL | Status: DC
  Administered 2023-12-24: 23:00:00 40 mg via ORAL
  Filled 2023-12-24: qty 1

## 2023-12-24 MED ORDER — OXYCODONE HCL 5 MG PO TABS: 5.0000 mg | ORAL_TABLET | ORAL | Status: DC | PRN

## 2023-12-24 MED ORDER — PIPERACILLIN-TAZOBACTAM 3.375 G IVPB
3.3750 g | Freq: Three times a day (TID) | INTRAVENOUS | Status: DC
  Administered 2023-12-25 – 2023-12-28 (×11): 3.375 g via INTRAVENOUS
  Filled 2023-12-24 (×11): qty 50

## 2023-12-24 MED ORDER — INSULIN ASPART 100 UNIT/ML IJ SOLN
0.0000 [IU] | Freq: Every day | INTRAMUSCULAR | Status: DC
  Administered 2023-12-28: 2 [IU] via SUBCUTANEOUS
  Filled 2023-12-24: qty 2

## 2023-12-24 MED ORDER — HEPARIN SOD (PORK) LOCK FLUSH 100 UNIT/ML IV SOLN
500.0000 [IU] | Freq: Once | INTRAVENOUS | Status: AC
  Administered 2023-12-24: 18:00:00 500 [IU] via INTRAVENOUS
  Filled 2023-12-24: qty 5

## 2023-12-24 MED ORDER — ONDANSETRON HCL 4 MG PO TABS: 4.0000 mg | ORAL_TABLET | Freq: Four times a day (QID) | ORAL | Status: DC | PRN

## 2023-12-24 MED ORDER — ACETAMINOPHEN 325 MG PO TABS
650.0000 mg | ORAL_TABLET | Freq: Four times a day (QID) | ORAL | Status: DC | PRN
  Administered 2023-12-24: 23:00:00 650 mg via ORAL
  Filled 2023-12-24: qty 2

## 2023-12-24 MED ORDER — IOHEXOL 300 MG/ML  SOLN
100.0000 mL | Freq: Once | INTRAMUSCULAR | Status: AC | PRN
  Administered 2023-12-24: 18:00:00 100 mL via INTRAVENOUS

## 2023-12-24 MED ORDER — HYDROMORPHONE HCL 1 MG/ML IJ SOLN
0.5000 mg | Freq: Once | INTRAMUSCULAR | Status: AC
  Administered 2023-12-24: 20:00:00 0.5 mg via INTRAVENOUS
  Filled 2023-12-24: qty 0.5

## 2023-12-24 MED ORDER — ENOXAPARIN SODIUM 80 MG/0.8ML IJ SOSY
0.5000 mg/kg | PREFILLED_SYRINGE | INTRAMUSCULAR | Status: DC
  Administered 2023-12-24: 23:00:00 65 mg via SUBCUTANEOUS
  Filled 2023-12-24: qty 0.65

## 2023-12-24 MED ORDER — PIPERACILLIN-TAZOBACTAM 3.375 G IVPB 30 MIN
3.3750 g | Freq: Once | INTRAVENOUS | Status: AC
  Administered 2023-12-24: 21:00:00 3.375 g via INTRAVENOUS
  Filled 2023-12-24: qty 50

## 2023-12-24 MED ORDER — ONDANSETRON HCL 4 MG/2ML IJ SOLN
4.0000 mg | Freq: Once | INTRAMUSCULAR | Status: AC
  Administered 2023-12-24: 20:00:00 4 mg via INTRAVENOUS
  Filled 2023-12-24: qty 2

## 2023-12-24 MED ORDER — HEPARIN SOD (PORK) LOCK FLUSH 100 UNIT/ML IV SOLN
INTRAVENOUS | Status: AC
  Filled 2023-12-24: qty 5

## 2023-12-24 MED ORDER — VENLAFAXINE HCL ER 75 MG PO CP24
150.0000 mg | ORAL_CAPSULE | Freq: Every day | ORAL | Status: DC
  Administered 2023-12-24 – 2023-12-29 (×4): 150 mg via ORAL
  Filled 2023-12-24: qty 2
  Filled 2023-12-24: qty 1
  Filled 2023-12-24: qty 2
  Filled 2023-12-24: qty 1
  Filled 2023-12-24: qty 2

## 2023-12-24 NOTE — Hospital Course (Signed)
 SABRA

## 2023-12-24 NOTE — Telephone Encounter (Signed)
 Please have scheduling offer her 01/21/24 at 1330

## 2023-12-24 NOTE — ED Provider Notes (Signed)
 SAB[facility]ame[facility] Bernardino Provider Note    Event Date/Time   First MD Initiated Contact with Patient 12/24/23 1835     (approximate)   History   Shortness of Breath and Abdominal Pain   HPI  Nancy [name] is a 58 y.o. female with history of ovarian cancer, diabetes, Takotsubo cardiomyopathy, presenting with shortness of breath and abdominal pain.  Shortness of breath has been ongoing for a while, is gotten worse over the last several days.  Also notes some chest pain.  No cough, no nausea vomiting or diarrhea.  States that the abdominal pain is mostly upper but she does feel some right lower quadrant abdominal pain as well.  States that she does have history of pericardial effusion, had been admitted to [facility] before for this but no interventions were done since she was told that her pericardial fusion had improved.  On independent chart review, she was seen by oncology in mid December, has history of ovarian cancer status post chemo, also status post olaparib  maintenance.  Was stopped due to diarrhea.  Does have history of pericardial effusion.  Also history of hypertension.  She was admitted to [facility] in late November, early December, has history of cardiomyopathy, presented with new pericardial effusion, as of tamponade, had a formal TTE done that showed moderate pericardial fusion started on treatment for pericarditis.      Physical Exam   Triage Vital Signs: ED Triage Vitals  Encounter Vitals Group     BP 12/24/23 1818 (!) 148/87     Girls Systolic BP Percentile --      Girls Diastolic BP Percentile --      Boys Systolic BP Percentile --      Boys Diastolic BP Percentile --      Pulse Rate 12/24/23 1818 99     Resp 12/24/23 1818 18     Temp 12/24/23 1818 98.6 F (37 C)     Temp Source 12/24/23 1818 Oral     SpO2 12/24/23 1818 96 %     Weight 12/24/23 1821 288 lb (130.6 kg)     Height 12/24/23 1821 5' 9 (1.753 m)     Head Circumference --      Peak Flow --       Pain Score 12/24/23 1819 9     Pain Loc --      Pain Education --      Exclude from Growth Chart --     Most recent vital signs: Vitals:   12/24/23 1818  BP: (!) 148/87  Pulse: 99  Resp: 18  Temp: 98.6 F (37 C)  SpO2: 96%     General: Awake, no distress.  CV:  Good peripheral perfusion.  Resp:  Normal effort.  No respiratory distress Abd:  No distention.  Soft, tender to the bilateral upper quadrants as well as right lower quadrant without guarding Other:  No unilateral Or Tenderness   ED Results / Procedures / Treatments   Labs (all labs ordered are listed, but only abnormal results are displayed) Labs Reviewed  BASIC METABOLIC PANEL WITH GFR - Abnormal; Notable for the following components:      Result Value   Sodium 131 (*)    Chloride 96 (*)    Glucose, Bld 188 (*)    BUN 27 (*)    Calcium 8.8 (*)    All other components within normal limits  CBC - Abnormal; Notable for the following components:   RBC 3.12 (*)  Hemoglobin 10.0 (*)    HCT 32.0 (*)    MCV 102.6 (*)    RDW 16.7 (*)    Platelets 492 (*)    All other components within normal limits  PRO BRAIN NATRIURETIC PEPTIDE  TROPONIN T, HIGH SENSITIVITY  TROPONIN T, HIGH SENSITIVITY     EKG  EKG shows, sinus tachycardia, rate 101, normal QS, normal QTc, no obvious ischemic ST elevation, T wave flattening to 1, aVL, T wave flattening is new in 1   RADIOLOGY On my independent interpretation, chest x-ray without obvious consolidation   PROCEDURES:  Critical Care performed: Yes, see critical care procedure note(s)  .Critical Care  Performed by: Waymond Lorelle Cummins, MD Authorized by: Waymond Lorelle Cummins, MD   Critical care provider statement:    Critical care time (minutes):  40   Critical care was time spent personally by me on the following activities:  Development of treatment plan with patient or surrogate, discussions with consultants, evaluation of patient's response to treatment, examination of  patient, ordering and review of laboratory studies, ordering and review of radiographic studies, ordering and performing treatments and interventions, pulse oximetry, re-evaluation of patient's condition and review of old charts Ultrasound ED Echo  Date/Time: 12/24/2023 7:55 PM  Performed by: Waymond Lorelle Cummins, MD Authorized by: Waymond Lorelle Cummins, MD   Procedure details:    Indications: dyspnea     Views: subxiphoid and parasternal long axis view     Images: not archived     Limitations:  Body habitus Findings:    Pericardium: large pericardial effusion     LV Function: normal (>50% EF)     RV Diameter: normal   Impression:    Impression: pericardial effusion present      MEDICATIONS ORDERED IN ED: Medications  piperacillin -tazobactam (ZOSYN ) IVPB 3.375 g (3.375 g Intravenous New Bag/Given 12/24/23 2032)  HYDROmorphone  (DILAUDID ) injection 0.5 mg (0.5 mg Intravenous Given 12/24/23 2024)  ondansetron  (ZOFRAN ) injection 4 mg (4 mg Intravenous Given 12/24/23 2024)     IMPRESSION / MDM / ASSESSMENT AND PLAN / ED COURSE  I reviewed the triage vital signs and the nursing notes.                              Differential diagnosis includes, but is not limited to, large pericardial effusion, did consider tamponade, pleural effusion, arrhythmia, atypical ACS, PE, for the abdominal pain, CT scan showed early appendicitis.  She does have tenderness on exam.  Will plan to consult surgery, start her IV antibiotics.  Labs, bedside ultrasound, will plan to reach out to interventional cardiology.  Patient's presentation is most consistent with acute presentation with potential threat to life or bodily function.  Bedside ultrasound views are difficult to get due to habitus.  She does have a moderate to large pericardial effusion, unable to clearly see her valves, was able to get it and 1 clip on M-mode, it does seem that there is diastolic RV collapse.  Reach out interventional cardiology now.  Also order  stat echo.  Independent interpretation of labs and imaging below.  Bedside ultrasound was done, very hard to see her valves due to poor windows.  Did see a moderate to large pericardial effusion, there is RV collapse, was able to get 1 clip that it looked like this was during diastole but unable to get those clips again.  Her IVC is large, not really collapsing.  Her blood pressures are  stable at this time.  Did discuss with Dr. Florencio from cardiology who recommended admission, echo tomorrow morning.  If her hemodynamics change, to call back again.  Did also consult surgery for her early appendicitis, recommended IV antibiotics, no surgical intervention until she is more stable from a cardiac standpoint.  Consult to hospitalist will admit the patient.  She is admitted.  The patient is on the cardiac monitor to evaluate for evidence of arrhythmia and/or significant heart rate changes.   Clinical Course as of 12/24/23 2041  Wed Dec 24, 2023  1851 Received call from radiology about her CT chest and pelvis result, it shows possible early appendicitis also large pericardial effusion. [TT]  1922 Consulted Dr. Florencio from cardiology, there are no echo techs at night, given that she is hemodynamically stable and she is not hypotensive, he recommends admitting her here and to get the echo in the morning and they will see her.  If she decompensates, to call them back again for emergent pericardiocentesis. [TT]  1925 DG Chest 2 View IMPRESSION: 1. Right IJ approach Port-A-Cath with tip projecting at the superior right atrium. 2. Cardiomegaly. 3. Aortic atherosclerosis   [TT]  1926 Consulted surgery who recommended IV antibiotics for now until she is stabilized. [TT]  1955 Independent review of labs, BNP is not elevated, troponins not elevated, no leukocytosis, she is mildly hyponatremic, rest electrolytes not severely deranged, creatinine is normal. [TT]    Clinical Course User Index [TT] Waymond, Lorelle Cummins, MD     FINAL CLINICAL IMPRESSION(S) / ED DIAGNOSES   Final diagnoses:  Abdominal pain of multiple sites  Pericardial effusion  Acute appendicitis, unspecified acute appendicitis type  Shortness of breath  Chest pain, unspecified type     Rx / DC Orders   ED Discharge Orders     None        Note:  This document was prepared using Dragon voice recognition software and may include unintentional dictation errors.    Waymond Lorelle Cummins, MD 12/24/23 [phone]

## 2023-12-24 NOTE — Telephone Encounter (Signed)
 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 Post treatment CT showed excellent response. S/p Debulking R1 on 02/20/2023, s/p 4 cycles of adjuvant carboplatin  taxol . She did not tolerate maintenance Olaparib  300mg  BID due to severe diarrhea/  Oral chemotherapy:  No; ON HOLD; pt's last dose of the Olaparib  taken in Dec 07, 2023. Dr. Babara is aware that she is not taking this at this time.  Symptom Triage Protocol:  Pain  History of the problem:  Pain Pain Rating (0-10): Currently 8/10, on average 9/10, at worst 10/10, at best 10/10  Pain Location: Mid abdominal under neath her rib caage  Pain Quality: Cramping/Stabbing-continuously  Associated Pain Symptoms: bloating  When Did The Pain Start: Initial Onset Nov 30th. But now Progressive Pain  Is the Pain Constant: Yes  Does the Pain Flare:   Yes; pain gets worse at night.  Does Anything Make the Pain Worse: Yes, sitting up;laying down.  Does Anything Make the Pain Better:  No     Assess for Changes in ADLs: Yes, patient hurts and notes that it takes a long time to get dressed. She gets out of breath getting out of the chair to get to the bathroom.     In the past, what treatments have you tried for your pain: Ibuprofen 200 mg once every 6-8 hours last taken 2days ago.; Has taken gas-x for the bloating. Currently does not have any narcotics at home. Pt requesting pain interventions.  Did these treatments help: No  Additional commentary:    Triage Nurse Guidance  Severity Index  Seek emergency care, call an ambulance immediately for: Signs/symptoms of acute injury, spinal cord  compression, pathologic fracture, infection, or other life threatening problem Sudden onset of severe weakness or unrelenting localized pain Inability to ambulate or decreased sensation in extremities Loss of control of bowel or bladder Chest pain  Seek medical care within two to four hours for: Sudden onset of moderate to severe pain Pain not responsive to current medication regimen Pain that interferes with mobility  Seek medical care within twenty-four hours for: Mild to moderate pain that has been increasing Pain that is interfering with activity or sleep  Follow home care instructions for: Mild to moderate aches and pains Notify the provider if no improvement within twenty-four hours    Patient Instruction  Disclaimer:  Patient specific and [employer] instruction provided verbally and sent through MyChart for active users.    Report the following problems No improvement in pain Pain that does not subside with interventions Other side effects, such as sedation, nausea, or constipation  Seek emergency care immediately if any of the following occur: Excruciating pain Immobility Low back pain associated with loss of bladder or bowel control; bilateral extremity weakness  Teach Back Method used:  Yes to patient and spouse    History of the problem:  Nausea Severity: moderate  Precipitating factors: Abdominal pain  Onset: acute  Duration: ntermittently all day yesterday and ongoing today.  Relieving factors: Zofran  sublingual  Nonpharmacologic interventions: none.  Effectiveness:    Food and fluid intake over past 24 hours: Ate small bites of sandwich at 10 am todayattempted ensure yesterday  Associated symptoms: Abdominal pain  Nausea Grade: 1 = Loss of appetite without alteration in eating habits 2 = Oral intake decreased without significant weight loss, dehydration, or malnutrition 3 = Inadequate oral caloric or fluid intake; tube feeding, TPN, or hospitalization indicated  2   History of the problem:  Vomiting Character, color, force, quality of vomit:  Yellow/orange  Other household members experiencing similar symptoms:  none  Are there other GI symptoms:  Abdominal distension  Severity:  moderate  Precipitating factors:  Abdominal pain  Onset:  Acute  Duration:  Intermittently all day.-last vomiting episode was at 1pm yesterday.  Relieving factors:  Zofran  sublingual  Nonpharmacologic interventions:  none.  Effectiveness:    Food and Fluid intake over past 24 hours:  Ate small bites of sandwich at 10 am today, attempted ensure yesterday  Associated symptoms:  Dry mucous membranes and Weakness, mild dizziness but improved with rest.  Vomiting Grade:   1 = Intervention not indicated  2 = Outpatient IV hydration; medical intervention indicated  3 = Tube feeding, TPN, or hospitalization indicated  4 = Life-threatening consequences  5 = Death  2    Triage Nurse Guidance Signs and Symptoms Action  Grossly bloody stool   Signs of severe dehydration  Severe lethargy or weakness  Heart palpitations  Decreased urine output  Sunken eyes  Orthostatic hypotension  Dizziness   Temperature higher than 100.4 F WITH suspect neutropenia   Symptoms of immune-mediated colitis, such as diarrhea accompanied by blood in the stool, severe abdominal pain, or tenderness   Symptoms of bowel perforation, such as diarrhea associated with severe abdominal pain, fever chills, nausea, and vomiting  Seek emergency care.  Call an ambulance immediately.   Excessive thirst, dry mouth  Temperature higher than 100.4 F WITHOUT suspect neutropenia  Diarrhea for more than five (5) days  More than four to six stools above baseline per day for two days  Swollen or painful abdomen  More than 10 stools per day  Weight loss of more than five (5) pounds since diarrhea began  Continued diarrhea despite antidiarrheal treatment  Decreased turgor; pinched skin does not spring back   Grade 3 or 4 nausea and vomiting  Seek urgent care within 24 hours.   Less than four (4) stools per day  Chronic diarrhea  Other family members with diarrhea  Recent travel to a foreign country  New prescription  Follow homecare instructions.  Notify MD if no improvement.     Patient Instruction  Disclaimer:  Patient specific and [employer] instruction provided verbally and sent through MyChart for active users.    Review with healthcare provider the prescribed antiemetic therapy, dose schedule, and route. Correctly and regularly comply with prescribed medication. Take frequent small sips of fluids.  Eat small amounts of food frequently.  Supplement diet with ginger or foods containing ginger.  Take an antiemetic 20 minutes prior to meals.  Monitor for signs of dehydration. Use distraction therapies (example:  music, breathing techniques, etc.)  Contact a healthcare provider during clinic hours if symptoms persist or become worse. If the patient is compliant with the antiemetic medication, contact the healthcare provider to receive an alternative antiemetic prescription  Seek Emergency Care Immediately if Any of the Following Occurs: Evidence of dehydration  Unable to eat or drink for 24 hours  Treatment change not effective within six hours  Teach Back Method used:  Yes to patient and spouse      Provider Consulted  Provider name  and credentials: Dr. Zelphia Cap and Fonda Mower, NP  Provider instruction: There is currently no availability in [facility] today.   Collaborated with MD/SMC team.   Per v/o Dr. Clarise will need a stat CT scan to r/o bowel obstruction. She can be seen in [facility] tomorrow-port lab/ smc/ possible IV fluids. Tinnie Dawn, NP has availability.  Dr. Cap would like to wait on CT scan results prior to prescribing narcotics.  Continual use of antiemetics as prescribed.    Nurse Triage Priority:  Urgent (obtain medical orders as indicated with instruction for  8-24 hour or sooner follow-up)  Barriers to Care:  None identified  Nurse Triage Disposition:  In-person follow-up visit:  See [facility] provider tomorrow s/p CT scan results . Patient aware that if CT scan demonstrated a bowel obstruction, she would be asked to go to the Emergency Room. Ct Scan at 330 pm today. Port lab/ smc/ IV fluid apt tomorrow at 130 pm. Mychart msg also sent with patient follow-up instructions.    Protocol Source:  Ruthine HERO., & Eldonna CANDIE Pee.). (2019). Telephone triage for oncology nurses (3rd ed.). Oncology Nursing Society.

## 2023-12-24 NOTE — Assessment & Plan Note (Signed)
 Cautiously continuing antihypertensives

## 2023-12-24 NOTE — Assessment & Plan Note (Addendum)
 Recent acute pericarditis 12/07/2023 at [facility](moderate effusion on echo 12/09/23) Hemodynamically stable Repeating echo to evaluate for change from last echo 12/09/2023 Continue colchicine  Telemetry Close hemodynamic monitoring Cardiology aware from the ED and formal consult placed Will keep n.p.o. in case of need for pericardial drain.  SCD for DVT prophylaxis Rheumatologic workup at [facility] for the most part negative

## 2023-12-24 NOTE — Telephone Encounter (Signed)
 Called patient to communicate CT results. Not able to reach her.  She has appointment tomorrow and will be evaluated at [facility] clinic.

## 2023-12-24 NOTE — Consult Note (Signed)
 ED Pharmacy Antibiotic Sign Off An antibiotic consult was received from an ED provider for Zosyn  per pharmacy dosing for IAI. A chart review was completed to assess appropriateness.   The following one time order(s) were placed:  Zosyn  3.375 grams IV x 1  Further antibiotic and/or antibiotic pharmacy consults should be ordered by the admitting provider if indicated.   Thank you for allowing pharmacy to be a part of this patient's care.   Kayla JULIANNA Blew, [facility]  Clinical Pharmacist 12/24/2023 7:17 PM

## 2023-12-24 NOTE — Telephone Encounter (Signed)
 Called pt to sched CT - left vm for return call - LH

## 2023-12-24 NOTE — Assessment & Plan Note (Signed)
 Continue Pristiq , mirtazapine  and alprazolam 

## 2023-12-24 NOTE — Assessment & Plan Note (Signed)
 Sliding scale insulin  coverage

## 2023-12-24 NOTE — ED Triage Notes (Signed)
 First nurse note: pt brought over by CT for possible pericardial effusion

## 2023-12-24 NOTE — Assessment & Plan Note (Deleted)
 Albuterol as needed

## 2023-12-24 NOTE — Assessment & Plan Note (Signed)
 Not acutely exacerbated Albuterol  nebulizer as needed

## 2023-12-24 NOTE — Assessment & Plan Note (Signed)
 No acute issues.

## 2023-12-24 NOTE — Assessment & Plan Note (Addendum)
 Chemotherapy induced diarrhea(olaparib ) S/p neoadjuvant chemoradiation, on maintenance chemotherapy, currently held Recently saw oncology 12/22/2023 when chemo was held due to diarrhea

## 2023-12-24 NOTE — Assessment & Plan Note (Addendum)
 History of Takotsubo cardiomyopathy 2010 Last echo 12/09/2023 with EF 70%(previously 30% in 2010) Continue carvedilol , lisinopril  with close BP monitoring in view of pericarditis Troponin and proBNP WNL Getting follow-up LFTs-recently abnormal at [facility]

## 2023-12-24 NOTE — H&P (Incomplete)
 History and Physical    Patient: Nancy [name] FMW:[id number] DOB: Jan 05, 1966 DOA: 12/24/2023 DOS: the patient was seen and examined on 12/24/2023 PCP: Orlean Alan HERO, FNP  Patient coming from: Home  Chief Complaint:  Chief Complaint  Patient presents with   Shortness of Breath   Abdominal Pain    HPI: Nancy [name] is a 58 y.o. female with medical history significant for HTN, DM , left BKA, asthma, morbid obesity, NICM (EF 70% 12/08/2023), stage 3 ovarian CA on maintenance chemotherapy, currently on hold due to chemotherapy induced diarrhea, being admitted with with a large pericardial effusion. Patient was recently hospitalized at [facility] (11/29 - 12/09/2023) with an acute pericardial effusion attributed to malignancy/chemo after presenting with chest pain and dyspnea on exertion.   Serial echocardiograms (most recently 12/09/23)showed stable moderate, circumferential pericardial effusion.  She was discharged on colchicine .  Since discharge she has had ongoing chest now extending to upper abdomen as well as dyspnea on exertion.  At her oncology follow-up on 12/24/2023 her oncologist ordered CT chest which showed a large effusion and she was directed to the ED for further workup..  In the ED, hemodynamically stable with BP 148/87 and pulse 99, respiratory rate 18 with O2 sat 96% on room air.  BMP and CBC at baseline, troponin and proBNP within normal limits.  Hepatic function panel not done.  EKG showed sinus tachycardia at 201 with low voltage QRS Chest x-ray showed cardiomegaly. Outpatient CT chest abdomen pelvis done earlier in the day showed new large pericardial effusion as well as possible appendicitis The ED provider spoke with cardiologist, Dr. Florencio who recommended admission and close hemodynamic monitoring.  Will see in the a.m. unless patient decompensates during the night, will evaluate sooner ED provider also spoke with surgery, Dr. Hezekiah regarding appendicitis seen  on CT and advised Zosyn  and cardiac clearance prior to surgical intervention Admission requested    Past Medical History:  Diagnosis Date   Abnormal cardiovascular stress test 09/21/2018   Formatting of this note might be different from the original. Lexiscan Myoview 09/16/2018 revealed mild anterior ischemia   Adverse effect of motion 05/10/2014   AKI (acute kidney injury) 10/22/2022   Anxiety    Arthritis    r knee   Asthma    Breast cyst 05/10/2014   CAD (coronary artery disease)    a.) LHC 06/04/2009 at [facility]; non-obstructive CAD. b.) CTA with FFR 10/08/2018: extensive mixed plaque proximal to mid LAD (51-69%); Coronary Ca score 217; FFR 0.71 dPDA, 0.86 mLAD, 0.87 dLCx.   CCF (congestive cardiac failure) (HCC) 06/06/2008   a.) 30% EF. b.) TTE 06/03/2011: EF >55%; triv MR. c.) TTE 04/27/2018: EF 55%; mild LVH; triv PR, mild MR/TR; G1DD.   Cellulitis of left lower extremity 09/18/2022   Chest pain with high risk for cardiac etiology 07/02/2016   Chronic use of opiate drug for therapeutic purpose 02/13/2022   Formatting of this note might be different from the original. [facility] Pain Contract signed on 04/18/16 & updated 03/04/17; UDS done on 04/18/16.   Complication of anesthesia    Diabetic foot infection (HCC) 03/13/2015   Diabetic foot ulcer (HCC) 02/08/2021   Diabetic ulcer of left heel associated with type 2 diabetes mellitus (HCC) 02/08/2021   Eczema    Family history of adverse reaction to anesthesia    a.) PONV in mother and grandmother   Gas gangrene (HCC) 09/18/2022   GERD (gastroesophageal reflux disease) 05/10/2014   History of kidney stones  History of MSSA bacteremia due to osteomyelitis L foot 09/18/22 10/22/2022   HLD (hyperlipidemia)    Hyponatremia 03/13/2015   Migraines    Motion sickness    MSSA bacteremia 09/20/2022   Osteomyelitis (HCC) 09/18/2022   Other acute osteomyelitis, left ankle and foot (HCC) 02/13/2022   Panic attacks    Peripheral edema 04/27/2018    Pneumonia    PONV (postoperative nausea and vomiting)    Sepsis secondary to diabetic foot infection 03/13/2015   Sepsis secondary to diabetic foot infection 03/13/2015   T2DM (type 2 diabetes mellitus) (HCC)    Takotsubo cardiomyopathy / transient apical balooning syndrome / stress-induced cardiomyopathy 05/10/2014   Unspecified essential hypertension    Past Surgical History:  Procedure Laterality Date   AMPUTATION Left 09/19/2022   Procedure: AMPUTATION BELOW KNEE;  Surgeon: Jama Cordella MATSU, MD;  Location: [facility];  Service: Vascular;  Laterality: Left;   AMPUTATION Left 09/24/2022   Procedure: AMPUTATION BELOW KNEE WITH WOUND CLOSURE;  Surgeon: Jama Cordella MATSU, MD;  Location: [facility];  Service: Vascular;  Laterality: Left;   AMPUTATION TOE Left 03/16/2015   Procedure: left fifth toe amputation with incision and drainage;  Surgeon: Eva Gay, DPM;  Location: [facility];  Service: Podiatry;  Laterality: Left;   APPLICATION OF WOUND VAC Left 02/14/2021   Procedure: APPLICATION OF WOUND VAC;  Surgeon: Gay Eva, DPM;  Location: [facility];  Service: Podiatry;  Laterality: Left;   APPLICATION OF WOUND VAC Left 04/16/2021   Procedure: APPLICATION OF WOUND VAC;  Surgeon: Lowery Estefana RAMAN, DO;  Location: [facility];  Service: Plastics;  Laterality: Left;   CARDIAC CATHETERIZATION Left 06/04/2009   Procedure: CARDIAC CATHETERIZATION; Location: [facility]   CHOLECYSTECTOMY     DEBRIDEMENT AND CLOSURE WOUND Left 04/16/2021   Procedure: DEBRIDEMENT AND CLOSURE WOUND;  Surgeon: Lowery Estefana RAMAN, DO;  Location: [facility];  Service: Plastics;  Laterality: Left;  1 hour   IR IMAGING GUIDED PORT INSERTION  11/18/2022   IRRIGATION AND DEBRIDEMENT FOOT Left 02/08/2021   Procedure: IRRIGATION AND DEBRIDEMENT FOOT - LFT HEEL ULCER;  Surgeon: Gay Eva, DPM;  Location: [facility];  Service: Podiatry;  Laterality: Left;   IRRIGATION AND  DEBRIDEMENT FOOT Left 02/14/2021   Procedure: IRRIGATION AND DEBRIDEMENT LEFT HEEL;  Surgeon: Gay Eva, DPM;  Location: [facility];  Service: Podiatry;  Laterality: Left;   kidney stone removal     KNEE ARTHROSCOPY W/ MENISCAL REPAIR     NOSE SURGERY  01/07/1989   due to fracture   TEE WITHOUT CARDIOVERSION N/A 09/26/2022   Procedure: TRANSESOPHAGEAL ECHOCARDIOGRAM;  Surgeon: Dewane Shiner, DO;  Location: [facility];  Service: Cardiovascular;  Laterality: N/A;   TOE AMPUTATION     second toe   TONSILLECTOMY     TOTAL KNEE ARTHROPLASTY  01/11/2011   Procedure: TOTAL KNEE ARTHROPLASTY;  Surgeon: Norleen LITTIE Gavel;  Location: [facility];  Service: Orthopedics;  Laterality: Right;  COMPUTER ASSISTED TOTAL KNEE REPLACEMENT   Social History:  reports that she quit smoking about 38 years ago. Her smoking use included cigarettes. She has never used smokeless tobacco. She reports that she does not drink alcohol and does not use drugs.  Allergies[1]  Family History  Problem Relation Age of Onset   Transient ischemic attack Mother    Asthma Mother    Hypertension Mother    Lung cancer Father 65   Hypertension Father    Asthma Sister    Anxiety disorder  Sister    Asthma Sister    Hypertension Brother    Ovarian cancer Maternal Aunt 88   Melanoma Maternal Uncle        dx 50s   Lung cancer Paternal Aunt 6   Stomach cancer Maternal Grandmother    Colon cancer Maternal Grandmother    Lymphoma Maternal Grandmother    Melanoma Maternal Grandfather 72       sun exposure   Lung cancer Paternal Grandmother 27   Kidney cancer Paternal Grandfather        dx early   Crohn's disease Other    GER disease Other    Breast cancer Neg Hx     Prior to Admission medications  Medication Sig Start Date End Date Taking? Authorizing Provider  acidophilus (RISAQUAD) CAPS capsule Take 1 capsule by mouth daily.   Yes [provider]  carvedilol  (COREG ) 6.25 MG tablet TAKE 1 TABLET BY MOUTH TWICE DAILY  11/24/23  Yes Orlean Alan HERO, FNP  colchicine  0.6 MG tablet Take 0.6 mg by mouth 2 (two) times daily. 12/09/23  Yes [provider]  desvenlafaxine  (PRISTIQ ) 100 MG 24 hr tablet Take 1 tablet (100 mg total) by mouth daily. 11/06/23  Yes Orlean Alan HERO, FNP  insulin  NPH-regular Human (70-30) 100 UNIT/ML injection Inject 70 Units into the skin 2 (two) times daily with a meal. 09/12/23  Yes Orlean Alan HERO, FNP  montelukast  (SINGULAIR ) 10 MG tablet Take 1 tablet (10 mg total) by mouth daily. 11/06/23  Yes Orlean Alan HERO, FNP  Multiple Vitamin (MULTIVITAMIN WITH MINERALS) TABS tablet Take 1 tablet by mouth daily. 10/01/22  Yes Darci Pore, MD  pantoprazole  (PROTONIX ) 40 MG tablet Take 1 tablet (40 mg total) by mouth daily. 11/12/23  Yes Orlean Alan HERO, FNP  albuterol  (PROVENTIL  HFA;VENTOLIN  HFA) 108 (90 Base) MCG/ACT inhaler Inhale 2 puffs into the lungs every 4 (four) hours as needed for wheezing or shortness of breath. Reported on 03/29/2015 Patient not taking: No sig reported 03/29/15   Agapito Mom, MD  albuterol  (PROVENTIL ) (2.5 MG/3ML) 0.083% nebulizer solution Take 2.5 mg by nebulization as needed. Patient not taking: No sig reported 12/20/21   [provider]  ALPRAZolam  (XANAX ) 0.5 MG tablet Take 0.5-1 tablets (0.25-0.5 mg total) by mouth 2 (two) times daily as needed for anxiety. Take 1/2 to 1 tablet by mouth as needed for panic attacks Patient not taking: No sig reported 08/02/23   Orlean Alan HERO, FNP  aspirin  EC 81 MG tablet Take 81 mg by mouth daily. Patient not taking: No sig reported    [provider]  Continuous Glucose Sensor (FREESTYLE LIBRE 2 SENSOR) MISC 1 each by Does not apply route every 14 (fourteen) days. Patient not taking: No sig reported 11/05/22   Orlean Alan HERO, FNP  diphenoxylate -atropine  (LOMOTIL ) 2.5-0.025 MG tablet Take 1 tablet by mouth 4 (four) times daily as needed for diarrhea or loose stools. Patient not taking:  No sig reported 06/20/23   Borders, Fonda SAUNDERS, NP  Ensure Max Protein (ENSURE MAX PROTEIN) LIQD Take 330 mLs (11 oz total) by mouth 2 (two) times daily. 09/30/22   Darci Pore, MD  fenofibrate  54 MG tablet TAKE 1 TABLET BY MOUTH EVERY DAY Patient not taking: No sig reported 03/24/23   Orlean Alan HERO, FNP  gabapentin  (NEURONTIN ) 300 MG capsule TAKE 1 CAPSULE BY MOUTH EVERY NIGHT AT BEDTIME Patient not taking: No sig reported 12/22/23   Brown, Fallon E, NP  ibuprofen (ADVIL) 800 MG  tablet TAKE 1 TABLET BY MOUTH EVERY 8 HOURS AS NEEDED Patient not taking: No sig reported 09/17/23   Orlean Alan HERO, FNP  lidocaine  4 % Place 1 patch onto the skin daily as needed (for pain). Patient not taking: Reported on 12/24/2023 12/09/23   [provider]  lidocaine -prilocaine  (EMLA ) cream Apply to affected area once Patient not taking: Reported on 12/24/2023 11/06/22   Babara Call, MD  lisinopril  (ZESTRIL ) 10 MG tablet Take 1 tablet (10 mg total) by mouth every morning AND 0.5 tablets (5 mg total) at bedtime. Patient not taking: No sig reported 11/12/23 11/11/24  Orlean Alan HERO, FNP  lisinopril  (ZESTRIL ) 5 MG tablet TAKE 1 TABLET BY MOUTH TWICE DAILY Patient not taking: No sig reported 12/03/23   Orlean Alan HERO, FNP  metFORMIN  (GLUCOPHAGE ) 500 MG tablet TAKE 2 TABLET BY MOUTH IN THE MORNING AND 3 TABLET BY MOUTH IN THE EVENING Patient not taking: No sig reported 10/17/23   Orlean Alan HERO, FNP  methocarbamol  (ROBAXIN ) 500 MG tablet Take 1 tablet (500 mg total) by mouth every 8 (eight) hours as needed for muscle spasms. Patient not taking: No sig reported 09/30/22   Darci Pore, MD  mirtazapine  (REMERON ) 7.5 MG tablet TAKE 1 TABLET BY MOUTH EVERY NIGHT AT BEDTIME Patient not taking: No sig reported 10/16/23   Borders, Fonda SAUNDERS, NP  naloxone  (NARCAN ) nasal spray 4 mg/0.1 mL SPRAY 1 SPRAY INTO ONE NOSTRIL AS DIRECTED FOR OPIOID OVERDOSE (TURN PERSON ON SIDE AFTER DOSE. IF NO RESPONSE  IN 2-3 MINUTES OR PERSON RESPONDS BUT RELAPSES, REPEAT USING A NEW SPRAY DEVICE AND SPRAY INTO THE OTHER NOSTRIL. CALL 911 AFTER USE.) * EMERGENCY USE ONLY * Patient not taking: No sig reported 11/28/22   Borders, Fonda SAUNDERS, NP  nystatin  (MYCOSTATIN /NYSTOP ) powder APPLY TOPICALLY TWICE DAILY AS NEEDED Patient not taking: No sig reported 04/01/22   Orlean Alan HERO, FNP  nystatin  ointment (MYCOSTATIN ) Apply 1 Application topically 2 (two) times daily. Patient not taking: No sig reported 11/05/22   Orlean Alan HERO, FNP  olaparib  (LYNPARZA ) 100 MG tablet Take 1 tablet (100 mg total) by mouth 2 (two) times daily. Swallow whole. May take with food to decrease nausea and vomiting. Take with 150mg  tablet Patient not taking: No sig reported 11/26/23   Babara Call, MD  olaparib  (LYNPARZA ) 150 MG tablet Take 1 tablet (150 mg total) by mouth 2 (two) times daily. Swallow whole. May take with food to decrease nausea and vomiting. Take with 100 mg tablet Patient not taking: No sig reported 11/26/23   Babara Call, MD  ondansetron  (ZOFRAN ) 8 MG tablet Take 1 tablet (8 mg total) by mouth every 8 (eight) hours as needed for nausea or vomiting. Start on the third day after carboplatin . Patient not taking: No sig reported 11/06/22   Babara Call, MD  ondansetron  (ZOFRAN -ODT) 8 MG disintegrating tablet Take 1 tablet (8 mg total) by mouth every 8 (eight) hours as needed for nausea or vomiting. Patient not taking: No sig reported 11/05/22   Orlean Alan HERO, FNP  pravastatin  (PRAVACHOL ) 40 MG tablet TAKE 1 TABLET BY MOUTH EVERY DAY Patient not taking: No sig reported 09/25/23   Orlean Alan HERO, FNP  prochlorperazine  (COMPAZINE ) 10 MG tablet Take 1 tablet (10 mg total) by mouth every 6 (six) hours as needed for nausea or vomiting. Patient not taking: No sig reported 06/18/23   Babara Call, MD  tirzepatide  (MOUNJARO ) 7.5 MG/0.5ML Pen Inject 7.5 mg into the skin once a  week. Patient not taking: No sig reported 11/06/23   Orlean Alan HERO, FNP  Ubrogepant  (UBRELVY ) 100 MG TABS Take 1 tablet (100 mg total) by mouth as needed (migraine). Patient not taking: No sig reported 11/06/23   Orlean Alan HERO, FNP  zinc  sulfate 220 (50 Zn) MG capsule Take 1 capsule (220 mg total) by mouth daily. Patient not taking: No sig reported 02/17/21   Cheryle Page, MD    Physical Exam: Vitals:   12/24/23 1818 12/24/23 1821  BP: (!) 148/87   Pulse: 99   Resp: 18   Temp: 98.6 F (37 C)   TempSrc: Oral   SpO2: 96%   Weight:  130.6 kg  Height:  5' 9 (1.753 m)   Physical Exam Vitals and nursing note reviewed.  Constitutional:      General: She is not in acute distress. HENT:     Head: Normocephalic and atraumatic.  Cardiovascular:     Rate and Rhythm: Normal rate and regular rhythm.     Heart sounds: Normal heart sounds.  Pulmonary:     Effort: Pulmonary effort is normal.     Breath sounds: Normal breath sounds.  Abdominal:     Palpations: Abdomen is soft.     Tenderness: There is no abdominal tenderness.  Musculoskeletal:     Comments: Prosthetic left limb  Neurological:     Mental Status: Mental status is at baseline.     Labs on Admission: I have personally reviewed following labs and imaging studies  CBC: Recent Labs  Lab 12/22/23 1308 12/24/23 1823  WBC 8.4 9.6  NEUTROABS 4.5  --   HGB 9.9* 10.0*  HCT 31.9* 32.0*  MCV 102.6* 102.6*  PLT 499* 492*   Basic Metabolic Panel: Recent Labs  Lab 12/22/23 1308 12/24/23 1823  NA 137 131*  K 4.4 4.4  CL 102 96*  CO2 23 23  GLUCOSE 163* 188*  BUN 23* 27*  CREATININE 0.90 0.88  CALCIUM 9.4 8.8*  MG 2.1  --    GFR: Estimated Creatinine Clearance: 100 mL/min (by C-G formula based on SCr of 0.88 mg/dL). Liver Function Tests: Recent Labs  Lab 12/22/23 1308  AST 21  ALT 31  ALKPHOS 92  BILITOT 0.4  PROT 7.5  ALBUMIN 4.1   No results for input(s): LIPASE, AMYLASE in the last 168 hours. No results for input(s): AMMONIA in the last 168  hours. Coagulation Profile: No results for input(s): INR, PROTIME in the last 168 hours. Cardiac Enzymes: No results for input(s): CKTOTAL, CKMB, CKMBINDEX, TROPONINI in the last 168 hours. BNP (last 3 results) Recent Labs    12/24/23 1823  PROBNP 211.0   HbA1C: No results for input(s): HGBA1C in the last 72 hours. CBG: No results for input(s): GLUCAP in the last 168 hours. Lipid Profile: No results for input(s): CHOL, HDL, LDLCALC, TRIG, CHOLHDL, LDLDIRECT in the last 72 hours. Thyroid  Function Tests: No results for input(s): TSH, T4TOTAL, FREET4, T3FREE, THYROIDAB in the last 72 hours. Anemia Panel: No results for input(s): VITAMINB12, FOLATE, FERRITIN, TIBC, IRON , RETICCTPCT in the last 72 hours. Urine analysis:    Component Value Date/Time   COLORURINE YELLOW (A) 10/22/2022 1526   APPEARANCEUR HAZY (A) 10/22/2022 1526   LABSPEC 1.026 10/22/2022 1526   PHURINE 5.0 10/22/2022 1526   GLUCOSEU NEGATIVE 10/22/2022 1526   HGBUR SMALL (A) 10/22/2022 1526   BILIRUBINUR Negative 07/31/2023 1106   KETONESUR NEGATIVE 10/22/2022 1526   PROTEINUR Positive (A) 07/31/2023 1106  PROTEINUR NEGATIVE 10/22/2022 1526   UROBILINOGEN 0.2 07/31/2023 1106   UROBILINOGEN 0.2 01/03/2011 1138   NITRITE Positive 07/31/2023 1106   NITRITE NEGATIVE 10/22/2022 1526   LEUKOCYTESUR Small (1+) (A) 07/31/2023 1106   LEUKOCYTESUR NEGATIVE 10/22/2022 1526    Radiological Exams on Admission: DG Chest 2 View Result Date: 12/24/2023 EXAM: 2 VIEW(S) XRAY OF THE CHEST 12/24/2023 06:38:21 PM COMPARISON: None available. CLINICAL HISTORY: shortness of breath FINDINGS: LINES, TUBES AND DEVICES: Right IJ approach Port-A-Cath in place with tip projecting at the superior right atrium. LUNGS AND PLEURA: No focal pulmonary opacity. No pleural effusion. No pneumothorax. HEART AND MEDIASTINUM: Cardiomegaly. Aortic atherosclerosis. BONES AND SOFT TISSUES: No acute  osseous abnormality. IMPRESSION: 1. Right IJ approach Port-A-Cath with tip projecting at the superior right atrium. 2. Cardiomegaly. 3. Aortic atherosclerosis. Electronically signed by: Greig Pique MD 12/24/2023 06:51 PM EST RP Workstation: [id number]   CT CHEST ABDOMEN PELVIS W CONTRAST Result Date: 12/24/2023 EXAM: CT CHEST, ABDOMEN AND PELVIS WITH CONTRAST 12/24/2023 06:14:55 PM TECHNIQUE: CT of the chest, abdomen and pelvis was performed with the administration of 100 mL of iohexol  (OMNIPAQUE ) 300 MG/ML solution. Multiplanar reformatted images are provided for review. Automated exposure control, iterative reconstruction, and/or weight based adjustment of the mA/kV was utilized to reduce the radiation dose to as low as reasonably achievable. COMPARISON: CT chest abdomen and pelvis 10/22/2023. CLINICAL HISTORY: ovarian cancer ovarian cancer FINDINGS: CHEST: MEDIASTINUM AND LYMPH NODES: There is a new large pericardial effusion. Right chest port catheter tip ends in the distal SVC. The central airways are clear. No mediastinal, hilar or axillary lymphadenopathy. LUNGS AND PLEURA: There is a new small right pleural effusion. There is a calcified granuloma in the right upper lobe. No focal consolidation or pulmonary edema. No pneumothorax. ABDOMEN AND PELVIS: LIVER: The liver is unremarkable. GALLBLADDER AND BILE DUCTS: The gallbladder is surgically absent. No biliary ductal dilatation. SPLEEN: No acute abnormality. PANCREAS: No acute abnormality. ADRENAL GLANDS: Low attenuation left adrenal adenoma or myelolipoma measures 3 cm; this is better seen on today's study when compared to the prior examination. The right adrenal gland appears normal. KIDNEYS, URETERS AND BLADDER: There is a small fat containing lesion in the left kidney posteriorly measuring 9 mm, likely angiomyolipoma. Subcentimeter hypodensity may represent a cyst in the inferior pole of the left kidney. There is a 5 mm fat containing lesion in the  inferior pole of the right kidney which is unchanged, likely angiomyolipoma. No stones in the kidneys or ureters. No hydronephrosis. No perinephric or periureteral stranding. Urinary bladder is unremarkable. GI AND BOWEL: The appendix is prominent in size measuring up to 8 mm. There is some trace surrounding inflammatory stranding. There is no fluid collection or perforation. Stomach demonstrates no acute abnormality. There is no bowel obstruction. REPRODUCTIVE ORGANS: The uterus is surgically absent. The ovaries are not definitively seen. PERITONEUM AND RETROPERITONEUM: There is trace free fluid in the right upper quadrant which is new from prior. No free air. VASCULATURE: Aorta is normal in caliber. There are atherosclerotic calcifications of the aorta. ABDOMINAL AND PELVIS LYMPH NODES: There are scattered nonenlarged retroperitoneal lymph nodes which are similar to the prior study. BONES AND SOFT TISSUES: No acute osseous abnormality. No focal soft tissue abnormality. IMPRESSION: 1. New large pericardial effusion. 2. New small right pleural effusion. 3. Prominent appendix with trace surrounding inflammatory stranding, without fluid collection or perforation. Please correlate clinically for early acute appendicitis. 4. Low attenuation 3 cm left adrenal lesion, favored adenoma or myelolipoma.  5. No evidence of metastatic disease. Electronically signed by: Greig Pique MD 12/24/2023 06:42 PM EST RP Workstation: [id number]   Data Reviewed for HPI: Relevant notes from primary care and specialist visits, past discharge summaries as available in EHR, including Care Everywhere. Prior diagnostic testing as pertinent to current admission diagnoses Updated medications and problem lists for reconciliation ED course, including vitals, labs, imaging, treatment and response to treatment Triage notes, nursing and pharmacy notes and ED provider's notes Notable results as noted above in HPI      Assessment and  Plan: * Pericardial effusion, without tamponade Recent acute pericarditis 12/07/2023 at [facility](moderate effusion on echo 12/09/23) Hemodynamically stable Repeating echo to evaluate for change from last echo 12/09/2023 Continue colchicine  Telemetry Close hemodynamic monitoring Cardiology aware from the ED and formal consult placed Will keep n.p.o. in case of need for pericardial drain.  SCD for DVT prophylaxis Rheumatologic workup at [facility] for the most part negative  NICM (nonischemic cardiomyopathy) (HCC) History of Takotsubo cardiomyopathy 2010 Last echo 12/09/2023 with EF 70%(previously 30% in 2010) Continue carvedilol , lisinopril  with close BP monitoring in view of pericarditis Troponin and proBNP WNL Getting LFTs--> recently elevated at [facility], now normal.    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) Chemotherapy induced diarrhea(olaparib ) S/p neoadjuvant chemoradiation, on maintenance chemotherapy, currently held Recently saw oncology 12/22/2023 when chemo was held due to diarrhea  Diabetes mellitus, type 2 (HCC) Sliding scale insulin  coverage  Generalized anxiety disorder Continue Pristiq , mirtazapine  and alprazolam   Obesity, Class III, BMI 40-49.9 (morbid obesity) (HCC) Complicating factor to overall prognosis and care  Asthma without status asthmaticus Not acutely exacerbated Albuterol  nebulizer as needed  S/P left  BKA 09/24/22 secondary to osteomyelitis (below knee amputation) (HCC) No acute issues    DVT prophylaxis: SCD  Consults: [facility] cardiology  Advance Care Planning:   Code Status: Prior   Family Communication: none  Disposition Plan: Back to previous home environment  Severity of Illness: The appropriate patient status for this patient is OBSERVATION. Observation status is judged to be reasonable and necessary in order to provide the required intensity of service to ensure the  patient's safety. The patient's presenting symptoms, physical exam findings, and initial radiographic and laboratory data in the context of their medical condition is felt to place them at decreased risk for further clinical deterioration. Furthermore, it is anticipated that the patient will be medically stable for discharge from the hospital within 2 midnights of admission.  CRITICAL CARE Performed by: Delayne LULLA Solian   Total critical care time: 60 minutes  Critical care time was exclusive of separately billable procedures and treating other patients.  Critical care was necessary to treat or prevent imminent or life-threatening deterioration.  Critical care was time spent personally by me on the following activities: development of treatment plan with patient and/or surrogate as well as nursing, discussions with consultants, evaluation of patient's response to treatment, examination of patient, obtaining history from patient or surrogate, ordering and performing treatments and interventions, ordering and review of laboratory studies, ordering and review of radiographic studies, pulse oximetry and re-evaluation of patient's condition.  Author: Delayne LULLA Solian, MD 12/24/2023 9:51 PM  For on call review [email or web address].      [1]  Allergies Allergen Reactions   Shellfish Allergy Anaphylaxis   Codeine Other (See Comments)    Migraine  Other Reaction(s): Other (See Comments)  Reaction:  Severe migraines   Morphine  And Codeine Other (See Comments)  Reaction:  Severe migraines    Rosuvastatin     Other reaction(s): Muscle Pain   Metformin  And Related Diarrhea   Percocet [Oxycodone -Acetaminophen ] Nausea And Vomiting   Sulfa Antibiotics Rash   Theophyllines Itching and Rash

## 2023-12-24 NOTE — ED Triage Notes (Signed)
 Pt to ED from CT for possible pericardial effusion. Pt has hx stage 3 ovarian cancer, port accessed. Pt c/o SOB and abdominal pain. Pt alert and oriented.

## 2023-12-24 NOTE — Progress Notes (Signed)
 Anticoagulation monitoring(Lovenox ):  58 yo female ordered Lovenox  40 mg Q24h    Filed Weights   12/24/23 1821  Weight: 130.6 kg (288 lb)   BMI 42.5    Lab Results  Component Value Date   CREATININE 0.88 12/24/2023   CREATININE 0.90 12/22/2023   CREATININE 1.23 (H) 11/04/2023   Estimated Creatinine Clearance: 100 mL/min (by C-G formula based on SCr of 0.88 mg/dL). Hemoglobin & Hematocrit     Component Value Date/Time   HGB 10.0 (L) 12/24/2023 1823   HGB 9.9 (L) 12/22/2023 1308   HGB 12.3 08/07/2022 1210   HCT 32.0 (L) 12/24/2023 1823   HCT 38.6 08/07/2022 1210     Per Protocol for Patient with estCrcl > 30 ml/min and BMI > 30, will transition to Lovenox  65 mg Q24h.

## 2023-12-25 ENCOUNTER — Telehealth (HOSPITAL_COMMUNITY): Payer: Self-pay

## 2023-12-25 ENCOUNTER — Inpatient Hospital Stay

## 2023-12-25 ENCOUNTER — Encounter: Payer: Self-pay | Admitting: Oncology

## 2023-12-25 ENCOUNTER — Inpatient Hospital Stay: Admit: 2023-12-25

## 2023-12-25 ENCOUNTER — Other Ambulatory Visit (HOSPITAL_COMMUNITY): Payer: Self-pay

## 2023-12-25 ENCOUNTER — Inpatient Hospital Stay: Admitting: Nurse Practitioner

## 2023-12-25 DIAGNOSIS — I2489 Other forms of acute ischemic heart disease: Secondary | ICD-10-CM | POA: Diagnosis present

## 2023-12-25 DIAGNOSIS — K3580 Unspecified acute appendicitis: Secondary | ICD-10-CM

## 2023-12-25 DIAGNOSIS — I428 Other cardiomyopathies: Secondary | ICD-10-CM | POA: Diagnosis present

## 2023-12-25 DIAGNOSIS — D539 Nutritional anemia, unspecified: Secondary | ICD-10-CM | POA: Diagnosis present

## 2023-12-25 DIAGNOSIS — Z8249 Family history of ischemic heart disease and other diseases of the circulatory system: Secondary | ICD-10-CM | POA: Diagnosis not present

## 2023-12-25 DIAGNOSIS — I309 Acute pericarditis, unspecified: Secondary | ICD-10-CM | POA: Diagnosis present

## 2023-12-25 DIAGNOSIS — T451X5A Adverse effect of antineoplastic and immunosuppressive drugs, initial encounter: Secondary | ICD-10-CM | POA: Diagnosis present

## 2023-12-25 DIAGNOSIS — E871 Hypo-osmolality and hyponatremia: Secondary | ICD-10-CM | POA: Diagnosis present

## 2023-12-25 DIAGNOSIS — E1140 Type 2 diabetes mellitus with diabetic neuropathy, unspecified: Secondary | ICD-10-CM | POA: Diagnosis present

## 2023-12-25 DIAGNOSIS — Z882 Allergy status to sulfonamides status: Secondary | ICD-10-CM | POA: Diagnosis not present

## 2023-12-25 DIAGNOSIS — I2510 Atherosclerotic heart disease of native coronary artery without angina pectoris: Secondary | ICD-10-CM | POA: Diagnosis present

## 2023-12-25 DIAGNOSIS — Z79899 Other long term (current) drug therapy: Secondary | ICD-10-CM | POA: Diagnosis not present

## 2023-12-25 DIAGNOSIS — I4891 Unspecified atrial fibrillation: Secondary | ICD-10-CM | POA: Diagnosis present

## 2023-12-25 DIAGNOSIS — Z7901 Long term (current) use of anticoagulants: Secondary | ICD-10-CM | POA: Diagnosis not present

## 2023-12-25 DIAGNOSIS — E66813 Obesity, class 3: Secondary | ICD-10-CM | POA: Diagnosis present

## 2023-12-25 DIAGNOSIS — F411 Generalized anxiety disorder: Secondary | ICD-10-CM | POA: Diagnosis present

## 2023-12-25 DIAGNOSIS — Z87891 Personal history of nicotine dependence: Secondary | ICD-10-CM | POA: Diagnosis not present

## 2023-12-25 DIAGNOSIS — C569 Malignant neoplasm of unspecified ovary: Secondary | ICD-10-CM | POA: Diagnosis present

## 2023-12-25 DIAGNOSIS — I3139 Other pericardial effusion (noninflammatory): Secondary | ICD-10-CM | POA: Diagnosis not present

## 2023-12-25 DIAGNOSIS — Z89512 Acquired absence of left leg below knee: Secondary | ICD-10-CM | POA: Diagnosis not present

## 2023-12-25 DIAGNOSIS — J4489 Other specified chronic obstructive pulmonary disease: Secondary | ICD-10-CM | POA: Diagnosis present

## 2023-12-25 DIAGNOSIS — Z6841 Body Mass Index (BMI) 40.0 and over, adult: Secondary | ICD-10-CM | POA: Diagnosis not present

## 2023-12-25 DIAGNOSIS — R1011 Right upper quadrant pain: Secondary | ICD-10-CM | POA: Diagnosis not present

## 2023-12-25 DIAGNOSIS — I700 Atherosclerosis of aorta: Secondary | ICD-10-CM | POA: Diagnosis present

## 2023-12-25 DIAGNOSIS — I110 Hypertensive heart disease with heart failure: Secondary | ICD-10-CM | POA: Diagnosis present

## 2023-12-25 DIAGNOSIS — E785 Hyperlipidemia, unspecified: Secondary | ICD-10-CM | POA: Diagnosis present

## 2023-12-25 LAB — CBG MONITORING, ED
Glucose-Capillary: 173 mg/dL — ABNORMAL HIGH (ref 70–99)
Glucose-Capillary: 212 mg/dL — ABNORMAL HIGH (ref 70–99)
Glucose-Capillary: 203 mg/dL — ABNORMAL HIGH (ref 70–99)
Glucose-Capillary: 186 mg/dL — ABNORMAL HIGH (ref 70–99)

## 2023-12-25 LAB — BASIC METABOLIC PANEL WITH GFR
Anion gap: 12 (ref 5–15)
BUN: 31 mg/dL — ABNORMAL HIGH (ref 6–20)
CO2: 24 mmol/L (ref 22–32)
Calcium: 8.8 mg/dL — ABNORMAL LOW (ref 8.9–10.3)
Chloride: 99 mmol/L (ref 98–111)
Creatinine, Ser: 1.15 mg/dL — ABNORMAL HIGH (ref 0.44–1.00)
GFR, Estimated: 55 mL/min — ABNORMAL LOW (ref >60)
Glucose, Bld: 178 mg/dL — ABNORMAL HIGH (ref 70–99)
Potassium: 4.3 mmol/L (ref 3.5–5.1)
Sodium: 135 mmol/L (ref 135–145)

## 2023-12-25 LAB — ECHOCARDIOGRAM COMPLETE
AR max vel: 3.32 cm2
AV Area VTI: 3.23 cm2
AV Area mean vel: 3.2 cm2
AV Mean grad: 3 mmHg
AV Peak grad: 4.9 mmHg
Ao pk vel: 1.11 m/s
Area-P 1/2: 3.17 cm2
Height: 69 in
MV VTI: 3.68 cm2
S' Lateral: 2.1 cm
Single Plane A2C EF: 73.7 %
Weight: 4608 [oz_av]

## 2023-12-25 LAB — CBC
HCT: 31.5 % — ABNORMAL LOW (ref 36.0–46.0)
Hemoglobin: 10 g/dL — ABNORMAL LOW (ref 12.0–15.0)
MCH: 32.1 pg (ref 26.0–34.0)
MCHC: 31.7 g/dL (ref 30.0–36.0)
MCV: 101 fL — ABNORMAL HIGH (ref 80.0–100.0)
Platelets: 486 K/uL — ABNORMAL HIGH (ref 150–400)
RBC: 3.12 MIL/uL — ABNORMAL LOW (ref 3.87–5.11)
RDW: 16.7 % — ABNORMAL HIGH (ref 11.5–15.5)
WBC: 7.8 K/uL (ref 4.0–10.5)
nRBC: 0 % (ref 0.0–0.2)

## 2023-12-25 LAB — HIV ANTIBODY (ROUTINE TESTING W REFLEX): HIV Screen 4th Generation wRfx: NONREACTIVE

## 2023-12-25 LAB — HEPARIN LEVEL (UNFRACTIONATED): Heparin Unfractionated: 0.74 [IU]/mL — ABNORMAL HIGH (ref 0.30–0.70)

## 2023-12-25 MED ORDER — SODIUM CHLORIDE 0.9 % IV SOLN
12.5000 mg | Freq: Four times a day (QID) | INTRAVENOUS | Status: DC | PRN
  Administered 2023-12-25 – 2023-12-27 (×3): 12.5 mg via INTRAVENOUS
  Filled 2023-12-25: qty 12.5
  Filled 2023-12-25: qty 0.5
  Filled 2023-12-25: qty 12.5

## 2023-12-25 MED ORDER — AMIODARONE HCL IN DEXTROSE 360-4.14 MG/200ML-% IV SOLN
60.0000 mg/h | INTRAVENOUS | Status: DC
  Administered 2023-12-25 (×2): 60 mg/h via INTRAVENOUS
  Filled 2023-12-25: qty 200

## 2023-12-25 MED ORDER — HEPARIN (PORCINE) 25000 UT/250ML-% IV SOLN
1400.0000 [IU]/h | INTRAVENOUS | Status: DC
  Administered 2023-12-25: 11:00:00 1500 [IU]/h via INTRAVENOUS
  Filled 2023-12-25 (×2): qty 250

## 2023-12-25 MED ORDER — FAMOTIDINE IN NACL 20-0.9 MG/50ML-% IV SOLN
20.0000 mg | Freq: Two times a day (BID) | INTRAVENOUS | Status: AC
  Administered 2023-12-25 – 2023-12-26 (×2): 20 mg via INTRAVENOUS
  Filled 2023-12-25 (×3): qty 50

## 2023-12-25 MED ORDER — HEPARIN BOLUS VIA INFUSION
4000.0000 [IU] | Freq: Once | INTRAVENOUS | Status: AC
  Administered 2023-12-25: 11:00:00 4000 [IU] via INTRAVENOUS
  Filled 2023-12-25: qty 4000

## 2023-12-25 MED ORDER — PANTOPRAZOLE SODIUM 40 MG IV SOLR
40.0000 mg | Freq: Every day | INTRAVENOUS | Status: AC
  Administered 2023-12-25: 10:00:00 40 mg via INTRAVENOUS
  Filled 2023-12-25: qty 10

## 2023-12-25 MED ORDER — AMIODARONE HCL IN DEXTROSE 360-4.14 MG/200ML-% IV SOLN
30.0000 mg/h | INTRAVENOUS | Status: DC
  Administered 2023-12-25 – 2023-12-28 (×6): 30 mg/h via INTRAVENOUS
  Filled 2023-12-25 (×6): qty 200

## 2023-12-25 MED ORDER — HYDROMORPHONE HCL 1 MG/ML IJ SOLN
0.5000 mg | INTRAMUSCULAR | Status: DC | PRN
  Administered 2023-12-25 – 2023-12-29 (×11): 0.5 mg via INTRAVENOUS
  Filled 2023-12-25: qty 1
  Filled 2023-12-25: qty 0.5
  Filled 2023-12-25 (×3): qty 1
  Filled 2023-12-25: qty 0.5
  Filled 2023-12-25: qty 1
  Filled 2023-12-25: qty 0.5
  Filled 2023-12-25: qty 1
  Filled 2023-12-25: qty 0.5
  Filled 2023-12-25: qty 1

## 2023-12-25 MED ORDER — METOPROLOL TARTRATE 5 MG/5ML IV SOLN: 5.0000 mg | INTRAVENOUS | Status: DC | PRN

## 2023-12-25 MED ORDER — AMIODARONE LOAD VIA INFUSION
150.0000 mg | Freq: Once | INTRAVENOUS | Status: AC
  Administered 2023-12-25: 10:00:00 150 mg via INTRAVENOUS
  Filled 2023-12-25: qty 83.34

## 2023-12-25 MED ORDER — OXYCODONE HCL 5 MG PO TABS
5.0000 mg | ORAL_TABLET | ORAL | Status: DC | PRN
  Administered 2023-12-26: 10 mg via ORAL
  Administered 2023-12-26: 5 mg via ORAL
  Administered 2023-12-26 – 2023-12-29 (×9): 10 mg via ORAL
  Filled 2023-12-25 (×10): qty 2
  Filled 2023-12-25: qty 1

## 2023-12-25 MED ADMIN — Perflutren Lipid Microsphere IV Susp 1.1 MG/ML: 2 mL | INTRAVENOUS | @ 16:00:00 | NDC 99999100031

## 2023-12-25 NOTE — Progress Notes (Signed)
° °  Brief Progress Note   _____________________________________________________________________________________________________________  Patient Name: Nancy [name] Patient DOB: 1965-05-28 Date: @TODAY @      Data: Reviewed vital signs, labs, and clinical notes.    Action: No action required at this time.    Response:  New onset A. Fib. On Heparin  and Amio gtt.  _____________________________________________________________________________________________________________  [facility] RN Expeditor Breslin Hemann S Nickalous Stingley Please contact us  directly via secure chat (search for [facility]) or by calling us  at [phone] [facility]).

## 2023-12-25 NOTE — Consult Note (Signed)
 [facility] SURGICAL CONSULTATION NOTE (initial) - cpt: 00756   HISTORY OF PRESENT ILLNESS (HPI):  58 y.o. female presented to [facility] ED yesterday for evaluation of SOB and abdominal pain. 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 Patient also recently admitted at [facility] at the start of the month and was found to have pericardial effusion. Since that time, she has been continuing to notice increasing fatigue, SOB with exertion, and weakness. Also reporting upper abdominal pain worse in epigastrium. She was seen by her oncologist (Dr Babara, MD) on 12/15 and she did undergo CT C/A/P. This again was concerning for pericardial effusion but there was some question of very subtle inflammation about the appendix. Additional abdominal surgeries positive for open cholecystectomy. Work up in the ED revealed a normal WBC 9.6K (7.8K this AM), Hgb to 10.0, sCr 0.88, hyponatremia to 131, BNP 211, LFTs reassuring. She has been admitted to medicine service. She is currently on Zosyn   Surgery is consulted by emergency medicine physician Dr. Lorelle Levan, MD in this context for evaluation and management of possible appendicitis.  PAST MEDICAL HISTORY (PMH):  Past Medical History:  Diagnosis Date   Abnormal cardiovascular stress test 09/21/2018   Formatting of this note might be different from the original. Lexiscan Myoview 09/16/2018 revealed mild anterior ischemia   Adverse effect of motion 05/10/2014   AKI (acute kidney injury) 10/22/2022   Anxiety    Arthritis    r knee   Asthma    Breast cyst 05/10/2014   CAD (coronary artery disease)    a.) LHC 06/04/2009 at [facility]; non-obstructive CAD. b.) CTA with FFR 10/08/2018: extensive mixed plaque proximal to mid LAD (51-69%); Coronary Ca score 217; FFR 0.71 dPDA, 0.86 mLAD, 0.87 dLCx.    [facility]F (congestive cardiac failure) (HCC) 06/06/2008   a.) 30% EF. b.) TTE 06/03/2011: EF >55%; triv MR. c.) TTE 04/27/2018: EF 55%; mild LVH; triv PR, mild MR/TR; G1DD.   Cellulitis of left lower extremity 09/18/2022   Chest pain with high risk for cardiac etiology 07/02/2016   Chronic use of opiate drug for therapeutic purpose 02/13/2022   Formatting of this note might be different from the original. [facility] Pain Contract signed on 04/18/16 & updated 03/04/17; UDS done on 04/18/16.   Complication of anesthesia    Diabetic foot infection (HCC) 03/13/2015   Diabetic foot ulcer (HCC) 02/08/2021   Diabetic ulcer of left heel associated with type 2 diabetes mellitus (HCC) 02/08/2021   Eczema    Family history of adverse reaction to anesthesia    a.) PONV in mother and grandmother   Gas gangrene (HCC) 09/18/2022   GERD (gastroesophageal reflux disease) 05/10/2014   History of kidney stones    History of MSSA bacteremia due to osteomyelitis L foot 09/18/22 10/22/2022   HLD (hyperlipidemia)    Hyponatremia 03/13/2015   Migraines    Motion sickness    MSSA bacteremia 09/20/2022   Osteomyelitis (HCC) 09/18/2022   Other acute osteomyelitis, left ankle and foot (HCC) 02/13/2022   Panic attacks    Peripheral edema 04/27/2018   Pneumonia    PONV (postoperative nausea and vomiting)    Sepsis secondary to diabetic foot infection 03/13/2015   Sepsis secondary to diabetic foot infection 03/13/2015   T2DM (type 2 diabetes mellitus) (HCC)    Takotsubo cardiomyopathy / transient apical balooning syndrome / stress-induced  cardiomyopathy 05/10/2014   Unspecified essential hypertension      PAST SURGICAL HISTORY [facility]):  Past Surgical History:  Procedure Laterality Date   AMPUTATION Left 09/19/2022   Procedure: AMPUTATION BELOW KNEE;  Surgeon: Jama Cordella MATSU, MD;  Location: [facility] ORS;  Service: Vascular;  Laterality: Left;   AMPUTATION Left 09/24/2022   Procedure: AMPUTATION BELOW KNEE WITH WOUND CLOSURE;   Surgeon: Jama Cordella MATSU, MD;  Location: [facility];  Service: Vascular;  Laterality: Left;   AMPUTATION TOE Left 03/16/2015   Procedure: left fifth toe amputation with incision and drainage;  Surgeon: Eva Gay, DPM;  Location: [facility];  Service: Podiatry;  Laterality: Left;   APPLICATION OF WOUND VAC Left 02/14/2021   Procedure: APPLICATION OF WOUND VAC;  Surgeon: Gay Eva, DPM;  Location: [facility];  Service: Podiatry;  Laterality: Left;   APPLICATION OF WOUND VAC Left 04/16/2021   Procedure: APPLICATION OF WOUND VAC;  Surgeon: Lowery Estefana RAMAN, DO;  Location: [facility];  Service: Plastics;  Laterality: Left;   CARDIAC CATHETERIZATION Left 06/04/2009   Procedure: CARDIAC CATHETERIZATION; Location: [facility]   CHOLECYSTECTOMY     DEBRIDEMENT AND CLOSURE WOUND Left 04/16/2021   Procedure: DEBRIDEMENT AND CLOSURE WOUND;  Surgeon: Lowery Estefana RAMAN, DO;  Location: [facility];  Service: Plastics;  Laterality: Left;  1 hour   IR IMAGING GUIDED PORT INSERTION  11/18/2022   IRRIGATION AND DEBRIDEMENT FOOT Left 02/08/2021   Procedure: IRRIGATION AND DEBRIDEMENT FOOT - LFT HEEL ULCER;  Surgeon: Gay Eva, DPM;  Location: [facility];  Service: Podiatry;  Laterality: Left;   IRRIGATION AND DEBRIDEMENT FOOT Left 02/14/2021   Procedure: IRRIGATION AND DEBRIDEMENT LEFT HEEL;  Surgeon: Gay Eva, DPM;  Location: [facility];  Service: Podiatry;  Laterality: Left;   kidney stone removal     KNEE ARTHROSCOPY W/ MENISCAL REPAIR     NOSE SURGERY  01/07/1989   due to fracture   TEE WITHOUT CARDIOVERSION N/A 09/26/2022   Procedure: TRANSESOPHAGEAL ECHOCARDIOGRAM;  Surgeon: Dewane Shiner, DO;  Location: [facility];  Service: Cardiovascular;  Laterality: N/A;   TOE AMPUTATION     second toe   TONSILLECTOMY     TOTAL KNEE ARTHROPLASTY  01/11/2011   Procedure: TOTAL KNEE ARTHROPLASTY;  Surgeon: Norleen LITTIE Gavel;  Location: [facility];  Service: Orthopedics;   Laterality: Right;  COMPUTER ASSISTED TOTAL KNEE REPLACEMENT     MEDICATIONS:  Prior to Admission medications  Medication Sig Start Date End Date Taking? Authorizing Provider  acidophilus (RISAQUAD) CAPS capsule Take 1 capsule by mouth daily.   Yes [provider]  carvedilol  (COREG ) 6.25 MG tablet TAKE 1 TABLET BY MOUTH TWICE DAILY 11/24/23  Yes Orlean Alan HERO, FNP  colchicine  0.6 MG tablet Take 0.6 mg by mouth 2 (two) times daily. 12/09/23  Yes [provider]  desvenlafaxine  (PRISTIQ ) 100 MG 24 hr tablet Take 1 tablet (100 mg total) by mouth daily. 11/06/23  Yes Orlean Alan HERO, FNP  insulin  NPH-regular Human (70-30) 100 UNIT/ML injection Inject 70 Units into the skin 2 (two) times daily with a meal. 09/12/23  Yes Orlean Alan HERO, FNP  montelukast  (SINGULAIR ) 10 MG tablet Take 1 tablet (10 mg total) by mouth daily. 11/06/23  Yes Orlean Alan HERO, FNP  Multiple Vitamin (MULTIVITAMIN WITH MINERALS) TABS tablet Take 1 tablet by mouth daily. 10/01/22  Yes Darci Pore, MD  pantoprazole  (PROTONIX ) 40 MG tablet Take 1 tablet (40 mg total) by mouth daily. 11/12/23  Yes  Orlean Alan HERO, FNP  albuterol  (PROVENTIL  HFA;VENTOLIN  HFA) 108 (90 Base) MCG/ACT inhaler Inhale 2 puffs into the lungs every 4 (four) hours as needed for wheezing or shortness of breath. Reported on 03/29/2015 Patient not taking: No sig reported 03/29/15   Agapito Mom, MD  albuterol  (PROVENTIL ) (2.5 MG/3ML) 0.083% nebulizer solution Take 2.5 mg by nebulization as needed. Patient not taking: No sig reported 12/20/21   [provider]  ALPRAZolam  (XANAX ) 0.5 MG tablet Take 0.5-1 tablets (0.25-0.5 mg total) by mouth 2 (two) times daily as needed for anxiety. Take 1/2 to 1 tablet by mouth as needed for panic attacks Patient not taking: No sig reported 08/02/23   Orlean Alan HERO, FNP  aspirin  EC 81 MG tablet Take 81 mg by mouth daily. Patient not taking: No sig reported    [provider]  Continuous Glucose Sensor (FREESTYLE LIBRE 2 SENSOR) MISC 1 each by Does not apply route every 14 (fourteen) days. Patient not taking: No sig reported 11/05/22   Orlean Alan HERO, FNP  diphenoxylate -atropine  (LOMOTIL ) 2.5-0.025 MG tablet Take 1 tablet by mouth 4 (four) times daily as needed for diarrhea or loose stools. Patient not taking: No sig reported 06/20/23   Borders, Fonda SAUNDERS, NP  Ensure Max Protein (ENSURE MAX PROTEIN) LIQD Take 330 mLs (11 oz total) by mouth 2 (two) times daily. 09/30/22   Darci Pore, MD  fenofibrate  54 MG tablet TAKE 1 TABLET BY MOUTH EVERY DAY Patient not taking: No sig reported 03/24/23   Orlean Alan HERO, FNP  gabapentin  (NEURONTIN ) 300 MG capsule TAKE 1 CAPSULE BY MOUTH EVERY NIGHT AT BEDTIME Patient not taking: No sig reported 12/22/23   Brown, Fallon E, NP  ibuprofen (ADVIL) 800 MG tablet TAKE 1 TABLET BY MOUTH EVERY 8 HOURS AS NEEDED Patient not taking: No sig reported 09/17/23   Orlean Alan HERO, FNP  lidocaine  4 % Place 1 patch onto the skin daily as needed (for pain). Patient not taking: Reported on 12/24/2023 12/09/23   [provider]  lidocaine -prilocaine  (EMLA ) cream Apply to affected area once Patient not taking: Reported on 12/24/2023 11/06/22   Babara Call, MD  lisinopril  (ZESTRIL ) 10 MG tablet Take 1 tablet (10 mg total) by mouth every morning AND 0.5 tablets (5 mg total) at bedtime. Patient not taking: No sig reported 11/12/23 11/11/24  Orlean Alan HERO, FNP  lisinopril  (ZESTRIL ) 5 MG tablet TAKE 1 TABLET BY MOUTH TWICE DAILY Patient not taking: No sig reported 12/03/23   Orlean Alan HERO, FNP  metFORMIN  (GLUCOPHAGE ) 500 MG tablet TAKE 2 TABLET BY MOUTH IN THE MORNING AND 3 TABLET BY MOUTH IN THE EVENING Patient not taking: No sig reported 10/17/23   Orlean Alan HERO, FNP  methocarbamol  (ROBAXIN ) 500 MG tablet Take 1 tablet (500 mg total) by mouth every 8 (eight) hours as needed for muscle spasms. Patient not taking: No sig  reported 09/30/22   Darci Pore, MD  mirtazapine  (REMERON ) 7.5 MG tablet TAKE 1 TABLET BY MOUTH EVERY NIGHT AT BEDTIME Patient not taking: No sig reported 10/16/23   Borders, Fonda SAUNDERS, NP  naloxone  (NARCAN ) nasal spray 4 mg/0.1 mL SPRAY 1 SPRAY INTO ONE NOSTRIL AS DIRECTED FOR OPIOID OVERDOSE (TURN PERSON ON SIDE AFTER DOSE. IF NO RESPONSE IN 2-3 MINUTES OR PERSON RESPONDS BUT RELAPSES, REPEAT USING A NEW SPRAY DEVICE AND SPRAY INTO THE OTHER NOSTRIL. CALL 911 AFTER USE.) * EMERGENCY USE ONLY * Patient not taking: No sig reported 11/28/22   Borders, Fonda  R, NP  nystatin  (MYCOSTATIN /NYSTOP ) powder APPLY TOPICALLY TWICE DAILY AS NEEDED Patient not taking: No sig reported 04/01/22   Orlean Alan HERO, FNP  nystatin  ointment (MYCOSTATIN ) Apply 1 Application topically 2 (two) times daily. Patient not taking: No sig reported 11/05/22   Orlean Alan HERO, FNP  olaparib  (LYNPARZA ) 100 MG tablet Take 1 tablet (100 mg total) by mouth 2 (two) times daily. Swallow whole. May take with food to decrease nausea and vomiting. Take with 150mg  tablet Patient not taking: No sig reported 11/26/23   Babara Call, MD  olaparib  (LYNPARZA ) 150 MG tablet Take 1 tablet (150 mg total) by mouth 2 (two) times daily. Swallow whole. May take with food to decrease nausea and vomiting. Take with 100 mg tablet Patient not taking: No sig reported 11/26/23   Babara Call, MD  ondansetron  (ZOFRAN ) 8 MG tablet Take 1 tablet (8 mg total) by mouth every 8 (eight) hours as needed for nausea or vomiting. Start on the third day after carboplatin . Patient not taking: No sig reported 11/06/22   Babara Call, MD  ondansetron  (ZOFRAN -ODT) 8 MG disintegrating tablet Take 1 tablet (8 mg total) by mouth every 8 (eight) hours as needed for nausea or vomiting. Patient not taking: No sig reported 11/05/22   Orlean Alan HERO, FNP  pravastatin  (PRAVACHOL ) 40 MG tablet TAKE 1 TABLET BY MOUTH EVERY DAY Patient not taking: No sig reported 09/25/23   Orlean Alan HERO, FNP  prochlorperazine  (COMPAZINE ) 10 MG tablet Take 1 tablet (10 mg total) by mouth every 6 (six) hours as needed for nausea or vomiting. Patient not taking: No sig reported 06/18/23   Babara Call, MD  tirzepatide  (MOUNJARO ) 7.5 MG/0.5ML Pen Inject 7.5 mg into the skin once a week. Patient not taking: No sig reported 11/06/23   Orlean Alan HERO, FNP  Ubrogepant  (UBRELVY ) 100 MG TABS Take 1 tablet (100 mg total) by mouth as needed (migraine). Patient not taking: No sig reported 11/06/23   Orlean Alan HERO, FNP  zinc  sulfate 220 (50 Zn) MG capsule Take 1 capsule (220 mg total) by mouth daily. Patient not taking: No sig reported 02/17/21   Cheryle Page, MD     ALLERGIES:  Allergies[1]   SOCIAL HISTORY:  Social History   Socioeconomic History   Marital status: Married    Spouse name: Not on file   Number of children: Not on file   Years of education: Not on file   Highest education level: Not on file  Occupational History   Not on file  Tobacco Use   Smoking status: Former    Current packs/day: 0.00    Types: Cigarettes    Quit date: 03/12/1985    Years since quitting: 38.8   Smokeless tobacco: Never  Vaping Use   Vaping status: Never Used  Substance and Sexual Activity   Alcohol use: No   Drug use: No   Sexual activity: Yes    Birth control/protection: Post-menopausal  Other Topics Concern   Not on file  Social History Narrative   Wife, Barnie, at bedside. One indoor pet, dog.   Social Drivers of Health   Tobacco Use: Medium Risk (12/24/2023)   Patient History    Smoking Tobacco Use: Former    Smokeless Tobacco Use: Never    Passive Exposure: Not on file  Financial Resource Strain: Medium Risk (12/01/2023)   [facility]   Overall Financial Resource Strain (CARDIA)    Difficulty of Paying Living Expenses: Somewhat  hard  Food Insecurity: No Food Insecurity (12/24/2023)   Epic    Worried About Programme Researcher, Broadcasting/film/video in the Last  Year: Never true    Ran Out of Food in the Last Year: Never true  Transportation Needs: No Transportation Needs (12/24/2023)   Epic    Lack of Transportation (Medical): No    Lack of Transportation (Non-Medical): No  Recent Concern: Transportation Needs - Unmet Transportation Needs (12/01/2023)   Received from [facility] - Transportation    In the past 12 months, has lack of transportation kept you from medical appointments or from getting medications?: Yes    Lack of Transportation (Non-Medical): Yes  Physical Activity: Not on file  Stress: Not on file  Social Connections: Not on file  Intimate Partner Violence: Not At Risk (10/22/2022)   Humiliation, Afraid, Rape, and Kick questionnaire    Fear of Current or Ex-Partner: No    Emotionally Abused: No    Physically Abused: No    Sexually Abused: No  Depression (PHQ2-9): Low Risk (11/04/2023)   Depression (PHQ2-9)    PHQ-2 Score: 1  Alcohol Screen: Low Risk (12/24/2023)   Alcohol Screen    Last Alcohol Screening Score (AUDIT): 0  Housing: Low Risk  (12/01/2023)   Received from [facility]   Epic    In the last 12 months, was there a time when you were not able to pay the mortgage or rent on time?: No    In the past 12 months, how many times have you moved where you were living?: 0    At any time in the past 12 months, were you homeless or living in a shelter (including now)?: No  Utilities: Not At Risk (12/01/2023)   Received from [facility]   Epic    In the past 12 months has the electric, gas, oil, or water  company threatened to shut off services in your home?: No  Health Literacy: Not on file     FAMILY HISTORY:  Family History  Problem Relation Age of Onset   Transient ischemic attack Mother    Asthma Mother    Hypertension Mother    Lung cancer Father 47   Hypertension Father    Asthma Sister    Anxiety disorder Sister    Asthma Sister     Hypertension Brother    Ovarian cancer Maternal Aunt 54   Melanoma Maternal Uncle        dx 59s   Lung cancer Paternal Aunt 33   Stomach cancer Maternal Grandmother    Colon cancer Maternal Grandmother    Lymphoma Maternal Grandmother    Melanoma Maternal Grandfather 72       sun exposure   Lung cancer Paternal Grandmother 45   Kidney cancer Paternal Grandfather        dx early   Crohn's disease Other    GER disease Other    Breast cancer Neg Hx       REVIEW OF SYSTEMS:  Review of Systems  Constitutional:  Negative for chills and fever.  Respiratory:  Positive for shortness of breath. Negative for cough.   Cardiovascular:  Negative for chest pain and palpitations.  Gastrointestinal:  Positive for abdominal pain and nausea. Negative for constipation, diarrhea and vomiting.  Genitourinary:  Negative for dysuria and urgency.  All other systems reviewed and are negative.   VITAL SIGNS:  Temp:  [97.5 F (36.4 C)-98.6 F (37 C)]  97.6 F (36.4 C) (12/18 0802) Pulse Rate:  [94-99] 95 (12/18 0738) Resp:  [11-18] 11 (12/18 0738) BP: (100-148)/(68-95) 100/68 (12/18 0730) SpO2:  [94 %-97 %] 94 % (12/18 0738) Weight:  [130.6 kg] 130.6 kg (12/17 1821)     Height: 5' 9 (175.3 cm) Weight: 130.6 kg BMI (Calculated): 42.51   INTAKE/OUTPUT:  12/17 0701 - 12/18 0700 In: 42.9 [IV Piggyback:42.9] Out: -   PHYSICAL EXAM:  Physical Exam Vitals and nursing note reviewed. Exam conducted with a chaperone present.  Constitutional:      General: She is not in acute distress.    Appearance: She is not ill-appearing.  HENT:     Head: Normocephalic and atraumatic.  Cardiovascular:     Rate and Rhythm: Tachycardia present. Rhythm irregular.     Comments: Tachycardic to 150 Pulmonary:     Effort: Pulmonary effort is normal. No respiratory distress.  Abdominal:     General: Abdomen is protuberant. A surgical scar is present. There is no distension.     Palpations: Abdomen is soft.      Tenderness: There is abdominal tenderness in the right upper quadrant. There is no guarding or rebound.     Comments: Abdomen is obese, soft, she appears tender primarily in upper abdomen, no rebound/guarding. She is certainly not peritonitic   Genitourinary:    Comments: Deferred Skin:    General: Skin is warm and dry.  Neurological:     General: No focal deficit present.     Mental Status: She is alert and oriented to person, place, and time.  Psychiatric:        Mood and Affect: Mood normal.        Behavior: Behavior normal.      Labs:     Latest Ref Rng & Units 12/25/2023    5:23 AM 12/24/2023    6:23 PM 12/22/2023    1:08 PM  CBC  WBC 4.0 - 10.5 K/uL 7.8  9.6  8.4   Hemoglobin 12.0 - 15.0 g/dL 89.9  89.9  9.9   Hematocrit 36.0 - 46.0 % 31.5  32.0  31.9   Platelets 150 - 400 K/uL 486  492  499       Latest Ref Rng & Units 12/25/2023    5:23 AM 12/24/2023    9:28 PM 12/24/2023    6:23 PM  CMP  Glucose 70 - 99 mg/dL 821   811   BUN 6 - 20 mg/dL 31   27   Creatinine 9.55 - 1.00 mg/dL 8.84   9.11   Sodium 864 - 145 mmol/L 135   131   Potassium 3.5 - 5.1 mmol/L 4.3   4.4   Chloride 98 - 111 mmol/L 99   96   CO2 22 - 32 mmol/L 24   23   Calcium 8.9 - 10.3 mg/dL 8.8   8.8   Total Protein 6.5 - 8.1 g/dL  6.9    Total Bilirubin 0.0 - 1.2 mg/dL  0.5    Alkaline Phos 38 - 126 U/L  99    AST 15 - 41 U/L  19    ALT 0 - 44 U/L  31      Imaging studies:   CT Chest/Abdomen/Pelvis (12/24/2023) personally reviewed and there is quite subtle, possible, inflammation in the right lower quadrant, and radiologist report reviewed below:  IMPRESSION: 1. New large pericardial effusion. 2. New small right pleural effusion. 3. Prominent appendix with trace surrounding inflammatory stranding, without  fluid collection or perforation. Please correlate clinically for early acute appendicitis. 4. Low attenuation 3 cm left adrenal lesion, favored adenoma or myelolipoma. 5. No evidence of  metastatic disease.   Assessment/Plan: 58 y.o. female with large pericardial effusion, now with afib RVR, found to have question of RLQ inflammation, complicated by gynecologic malignancy   - Appreciate hospitalist admission - In regards to her CT, there is very subtle inflammation in the RLQ about the appendix. I am not convinced this is acute appendicitis given her upper abdominal pain and lack of leukocytosis. This may also be secondary changes from her gynecologic malignancy. I do think her symptoms may be more secondary to her pericardial effusion. Regardless, she is as markedly increased risk for operative intervention. I do think it is reasonable to treat empirically with Abx. Should she worsen, or fail to improve, she may need consideration of transfer back to tertiary center where her surgical gynecologic oncologist is.   - Reasonable to continue NPO for now  - Continue IV Abx (Zosyn )  - Monitor abdominal examination   - Pain control prn; antiemetics prn  - She is without leukocytosis/fever  - Okay for anticoagulation from surgical perspective at this time  - Further management per primary service  All of the above findings and recommendations were discussed with the patient, and all of patient's questions were answered to her expressed satisfaction.  Thank you for the opportunity to participate in this patient's care.   -- Arthea Platt, PA-C [facility] 12/25/2023, 10:17 AM M-F: 7am - 4pm     [1]  Allergies Allergen Reactions   Shellfish Allergy Anaphylaxis   Codeine Other (See Comments)    Migraine  Other Reaction(s): Other (See Comments)  Reaction:  Severe migraines   Morphine  And Codeine Other (See Comments)    Reaction:  Severe migraines    Rosuvastatin     Other reaction(s): Muscle Pain   Metformin  And Related Diarrhea   Percocet [Oxycodone -Acetaminophen ] Nausea And Vomiting   Sulfa Antibiotics Rash   Theophyllines Itching and Rash

## 2023-12-25 NOTE — Progress Notes (Addendum)
 PROGRESS NOTE    ARYANNAH MOHON  FMW:[id number] DOB: Nov 19, 1965 DOA: 12/24/2023 PCP: Orlean Alan HERO, FNP    Brief Narrative:  58 y.o. female with medical history significant for HTN, DM , left BKA, asthma, morbid obesity, NICM (EF 70% 12/08/2023), stage 3 ovarian CA on maintenance chemotherapy, currently on hold due to chemotherapy induced diarrhea, being admitted with with a large pericardial effusion. Patient was recently hospitalized at [facility] (11/29 - 12/09/2023) with an acute pericardial effusion attributed to malignancy/chemo after presenting with chest pain and dyspnea on exertion.   Serial echocardiograms (most recently 12/09/23)showed stable moderate, circumferential pericardial effusion.  She was discharged on colchicine .  Since discharge she has had ongoing chest now extending to upper abdomen as well as dyspnea on exertion.  At her oncology follow-up on 12/24/2023 her oncologist ordered CT chest which showed a large effusion and she was directed to the ED for further workup..   In the ED, hemodynamically stable with BP 148/87 and pulse 99, respiratory rate 18 with O2 sat 96% on room air.  BMP and CBC at baseline, troponin and proBNP within normal limits.  Hepatic function panel not done.  EKG showed sinus tachycardia at 201 with low voltage QRS Chest x-ray showed cardiomegaly. Outpatient CT chest abdomen pelvis done earlier in the day showed new large pericardial effusion as well as possible appendicitis The ED provider spoke with cardiologist, Dr. Florencio who recommended admission and close hemodynamic monitoring.  Will see in the a.m. unless patient decompensates during the night, will evaluate sooner ED provider also spoke with surgery, Dr. Hezekiah regarding appendicitis seen on CT and advised Zosyn  and cardiac clearance prior to surgical intervention Admission requested   Cardiology and general surgery consulted.  Surgery not indicated at this time.  Clinical presentation is  not consistent with acute appendicitis.  Patient main complaint is upper abdominal pain associated with fatigue and nausea.   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)   Diabetes mellitus, type 2 (HCC)   Obesity, Class III, BMI 40-49.9 (morbid obesity) (HCC)   Generalized anxiety disorder   Asthma without status asthmaticus   Neuropathy, diabetic (HCC)   Migraine without aura and without status migrainosus, not intractable   S/P left  BKA 09/24/22 secondary to osteomyelitis (below knee amputation) (HCC)   Chemotherapy induced diarrhea   Chronic obstructive pulmonary disease, unspecified COPD type (HCC)   Acute appendicitis  Pericardial effusion, without tamponade Recent acute pericarditis 12/07/2023 at [facility](moderate effusion on echo 12/09/23) Hemodynamically stable Suspected secondary to no malignancy Plan: Repeating echo to evaluate for change from last echo 12/09/2023 Continue colchicine  Telemetry Close hemodynamic monitoring Cardiology consulted Keep n.p.o. for now Avoid chemoprophylaxis  New onset rapid atrial fibrillation Noted on telemetry and verified by twelve-lead EKG on 12/18 Plan: Amio gtt. per cardiology Heparin  gtt. Continue telemetry monitoring   NICM (nonischemic cardiomyopathy) (HCC) History of Takotsubo cardiomyopathy 2010 Last echo 12/09/2023 with EF 70%(previously 30% in 2010) Continue carvedilol , lisinopril  with close BP monitoring in view of pericarditis Troponin and proBNP WNL Getting LFTs--> recently elevated at [facility], now normal.   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) Chemotherapy induced diarrhea(olaparib ) S/p neoadjuvant chemoradiation, on maintenance chemotherapy, currently  held Recently saw oncology 12/22/2023 when chemo was held due to diarrhea   Diabetes mellitus,  type 2 (HCC) Sliding scale insulin  coverage   Generalized anxiety disorder Continue Pristiq , mirtazapine  and alprazolam    Obesity, Class III, BMI 40-49.9 (morbid obesity) (HCC) Complicating factor to overall prognosis and care   Asthma without status asthmaticus Not acutely exacerbated Albuterol  nebulizer as needed   S/P left  BKA 09/24/22 secondary to osteomyelitis (below knee amputation) (HCC) No acute issues     DVT prophylaxis: SCD Code Status: Full Family Communication: Spouse at bedside 12/18 Disposition Plan: Status is: Observation The patient will require care spanning > 2 midnights and should be moved to inpatient because: Symptomatic pericardial effusion.  Concern for intra-abdominal infection on IV antibiotics.   Level of care: Progressive  Consultants:  General Surgery Cardiology  Procedures:  None  Antimicrobials: Zosyn    Subjective: Seen and examined.  Appears fatigued.  Reports upper abdominal pain, epigastrium bandlike quality associated with nausea  Objective: Vitals:   12/25/23 0730 12/25/23 0738 12/25/23 0802 12/25/23 1222  BP: 100/68   (!) 127/90  Pulse: 94 95  83  Resp: 14 11  11   Temp:  97.7 F (36.5 C) 97.6 F (36.4 C) 98.2 F (36.8 C)  TempSrc:   Oral   SpO2: 96% 94%  95%  Weight:      Height:        Intake/Output Summary (Last 24 hours) at 12/25/2023 1302 Last data filed at 12/24/2023 2102 Gross per 24 hour  Intake 42.91 ml  Output --  Net 42.91 ml   Filed Weights   12/24/23 1821  Weight: 130.6 kg    Examination:  General exam: Fatigued and uncomfortable Respiratory system: Clear to auscultation. Respiratory effort normal. Cardiovascular system: Diminished heart sounds, S1-S2, no murmurs Gastrointestinal system: Soft, NT/ND, normal bowel sounds Central nervous system: Alert and oriented. No focal neurological  deficits. Extremities: Left lower extremity BKA Skin: No rashes, lesions or ulcers Psychiatry: Judgement and insight appear normal. Mood & affect appropriate.     Data Reviewed: I have personally reviewed following labs and imaging studies  CBC: Recent Labs  Lab 12/22/23 1308 12/24/23 1823 12/25/23 0523  WBC 8.4 9.6 7.8  NEUTROABS 4.5  --   --   HGB 9.9* 10.0* 10.0*  HCT 31.9* 32.0* 31.5*  MCV 102.6* 102.6* 101.0*  PLT 499* 492* 486*   Basic Metabolic Panel: Recent Labs  Lab 12/22/23 1308 12/24/23 1823 12/25/23 0523  NA 137 131* 135  K 4.4 4.4 4.3  CL 102 96* 99  CO2 23 23 24   GLUCOSE 163* 188* 178*  BUN 23* 27* 31*  CREATININE 0.90 0.88 1.15*  CALCIUM 9.4 8.8* 8.8*  MG 2.1  --   --    GFR: Estimated Creatinine Clearance: 76.5 mL/min (A) (by C-G formula based on SCr of 1.15 mg/dL (H)). Liver Function Tests: Recent Labs  Lab 12/22/23 1308 12/24/23 2128  AST 21 19  ALT 31 31  ALKPHOS 92 99  BILITOT 0.4 0.5  PROT 7.5 6.9  ALBUMIN 4.1 3.7   No results for input(s): LIPASE, AMYLASE in the last 168 hours. No results for input(s): AMMONIA in the last 168 hours. Coagulation Profile: No results for input(s): INR, PROTIME in the last 168 hours. Cardiac Enzymes: No results for input(s): CKTOTAL, CKMB, CKMBINDEX, TROPONINI in the last 168 hours. BNP (last 3 results) Recent Labs    12/24/23 1823  PROBNP 211.0   HbA1C: No results for input(s): HGBA1C in the last 72 hours. CBG: Recent Labs  Lab 12/24/23 2255 12/25/23 0804 12/25/23 1140  GLUCAP 135* 173* 212*   Lipid Profile: No results for input(s): CHOL, HDL, LDLCALC, TRIG, CHOLHDL, LDLDIRECT in the last 72 hours. Thyroid  Function Tests: No results for input(s): TSH, T4TOTAL, FREET4, T3FREE, THYROIDAB in the last 72 hours. Anemia Panel: No results for input(s): VITAMINB12, FOLATE, FERRITIN, TIBC, IRON , RETICCTPCT in the last 72 hours. Sepsis  Labs: No results for input(s): PROCALCITON, LATICACIDVEN in the last 168 hours.  No results found for this or any previous visit (from the past 240 hours).       Radiology Studies: DG Chest 2 View Result Date: 12/24/2023 EXAM: 2 VIEW(S) XRAY OF THE CHEST 12/24/2023 06:38:21 PM COMPARISON: None available. CLINICAL HISTORY: shortness of breath FINDINGS: LINES, TUBES AND DEVICES: Right IJ approach Port-A-Cath in place with tip projecting at the superior right atrium. LUNGS AND PLEURA: No focal pulmonary opacity. No pleural effusion. No pneumothorax. HEART AND MEDIASTINUM: Cardiomegaly. Aortic atherosclerosis. BONES AND SOFT TISSUES: No acute osseous abnormality. IMPRESSION: 1. Right IJ approach Port-A-Cath with tip projecting at the superior right atrium. 2. Cardiomegaly. 3. Aortic atherosclerosis. Electronically signed by: Greig Pique MD 12/24/2023 06:51 PM EST RP Workstation: [id number]   CT CHEST ABDOMEN PELVIS W CONTRAST Result Date: 12/24/2023 EXAM: CT CHEST, ABDOMEN AND PELVIS WITH CONTRAST 12/24/2023 06:14:55 PM TECHNIQUE: CT of the chest, abdomen and pelvis was performed with the administration of 100 mL of iohexol  (OMNIPAQUE ) 300 MG/ML solution. Multiplanar reformatted images are provided for review. Automated exposure control, iterative reconstruction, and/or weight based adjustment of the mA/kV was utilized to reduce the radiation dose to as low as reasonably achievable. COMPARISON: CT chest abdomen and pelvis 10/22/2023. CLINICAL HISTORY: ovarian cancer ovarian cancer FINDINGS: CHEST: MEDIASTINUM AND LYMPH NODES: There is a new large pericardial effusion. Right chest port catheter tip ends in the distal SVC. The central airways are clear. No mediastinal, hilar or axillary lymphadenopathy. LUNGS AND PLEURA: There is a new small right pleural effusion. There is a calcified granuloma in the right upper lobe. No focal consolidation or pulmonary edema. No pneumothorax. ABDOMEN AND PELVIS:  LIVER: The liver is unremarkable. GALLBLADDER AND BILE DUCTS: The gallbladder is surgically absent. No biliary ductal dilatation. SPLEEN: No acute abnormality. PANCREAS: No acute abnormality. ADRENAL GLANDS: Low attenuation left adrenal adenoma or myelolipoma measures 3 cm; this is better seen on today's study when compared to the prior examination. The right adrenal gland appears normal. KIDNEYS, URETERS AND BLADDER: There is a small fat containing lesion in the left kidney posteriorly measuring 9 mm, likely angiomyolipoma. Subcentimeter hypodensity may represent a cyst in the inferior pole of the left kidney. There is a 5 mm fat containing lesion in the inferior pole of the right kidney which is unchanged, likely angiomyolipoma. No stones in the kidneys or ureters. No hydronephrosis. No perinephric or periureteral stranding. Urinary bladder is unremarkable. GI AND BOWEL: The appendix is prominent in size measuring up to 8 mm. There is some trace surrounding inflammatory stranding. There is no fluid collection or perforation. Stomach demonstrates no acute abnormality. There is no bowel obstruction. REPRODUCTIVE ORGANS: The uterus is surgically absent. The ovaries are not definitively seen. PERITONEUM AND RETROPERITONEUM: There is trace free fluid in the right upper quadrant which is new from prior. No free air. VASCULATURE: Aorta is normal in caliber. There are atherosclerotic calcifications of the aorta. ABDOMINAL AND PELVIS LYMPH NODES: There are scattered nonenlarged retroperitoneal lymph nodes which are similar to the prior study. BONES AND SOFT TISSUES: No acute osseous abnormality. No focal soft tissue abnormality.  IMPRESSION: 1. New large pericardial effusion. 2. New small right pleural effusion. 3. Prominent appendix with trace surrounding inflammatory stranding, without fluid collection or perforation. Please correlate clinically for early acute appendicitis. 4. Low attenuation 3 cm left adrenal lesion,  favored adenoma or myelolipoma. 5. No evidence of metastatic disease. Electronically signed by: Greig Pique MD 12/24/2023 06:42 PM EST RP Workstation: [id number]        Scheduled Meds:  colchicine   0.6 mg Oral BID   insulin  aspart  0-15 Units Subcutaneous TID WC   insulin  aspart  0-5 Units Subcutaneous QHS   pantoprazole  (PROTONIX ) IV  40 mg Intravenous Daily   venlafaxine  XR  150 mg Oral Q breakfast   Continuous Infusions:  amiodarone  60 mg/hr (12/25/23 0942)   Followed by   amiodarone      famotidine  (PEPCID ) IV Stopped (12/25/23 1239)   heparin  1,500 Units/hr (12/25/23 1119)   piperacillin -tazobactam (ZOSYN )  IV Stopped (12/25/23 9162)   promethazine  (PHENERGAN ) injection (IM or IVPB)       LOS: 0 days      Calvin KATHEE Robson, MD [facility]   If 7PM-7AM, please contact night-coverage  12/25/2023, 1:02 PM

## 2023-12-25 NOTE — Assessment & Plan Note (Signed)
 Complicating factor to overall prognosis and care

## 2023-12-25 NOTE — Telephone Encounter (Signed)
 Pharmacy Patient Advocate Encounter  Insurance verification completed.    The patient is insured through [facility]. Patient has Medicare and is not eligible for a copay card, but may be able to apply for patient assistance or Medicare RX Payment Plan (Patient Must reach out to their plan, if eligible for payment plan), if available.    Ran test claim for Eliquis  5mg  tablet and the current 30 day co-pay is $0.  Ran test claim for Xarelto 20mg  tablet and the current 30 day co-pay is $0.  This test claim was processed through [employer]- copay amounts may vary at other pharmacies due to [employer], or as the patient moves through the different stages of their insurance plan.

## 2023-12-25 NOTE — Progress Notes (Signed)
 PHARMACY - ANTICOAGULATION CONSULT NOTE  Pharmacy Consult for heparin  Indication: atrial fibrillation  Allergies[1]  Patient Measurements: Height: 5' 9 (175.3 cm) Weight: 130.6 kg (288 lb) IBW/kg (Calculated) : 66.2 HEPARIN  DW (KG): 97.1  Vital Signs: Temp: 97.6 F (36.4 C) (12/18 0802) Temp Source: Oral (12/18 0802) BP: 100/68 (12/18 0730) Pulse Rate: 95 (12/18 0738)  Labs: Recent Labs    12/22/23 1308 12/24/23 1823 12/25/23 0523  HGB 9.9* 10.0* 10.0*  HCT 31.9* 32.0* 31.5*  PLT 499* 492* 486*  CREATININE 0.90 0.88 1.15*    Estimated Creatinine Clearance: 76.5 mL/min (A) (by C-G formula based on SCr of 1.15 mg/dL (H)).   Medical History: Past Medical History:  Diagnosis Date   Abnormal cardiovascular stress test 09/21/2018   Formatting of this note might be different from the original. Lexiscan Myoview 09/16/2018 revealed mild anterior ischemia   Adverse effect of motion 05/10/2014   AKI (acute kidney injury) 10/22/2022   Anxiety    Arthritis    r knee   Asthma    Breast cyst 05/10/2014   CAD (coronary artery disease)    a.) LHC 06/04/2009 at [facility]; non-obstructive CAD. b.) CTA with FFR 10/08/2018: extensive mixed plaque proximal to mid LAD (51-69%); Coronary Ca score 217; FFR 0.71 dPDA, 0.86 mLAD, 0.87 dLCx.   CCF (congestive cardiac failure) (HCC) 06/06/2008   a.) 30% EF. b.) TTE 06/03/2011: EF >55%; triv MR. c.) TTE 04/27/2018: EF 55%; mild LVH; triv PR, mild MR/TR; G1DD.   Cellulitis of left lower extremity 09/18/2022   Chest pain with high risk for cardiac etiology 07/02/2016   Chronic use of opiate drug for therapeutic purpose 02/13/2022   Formatting of this note might be different from the original. [facility] Pain Contract signed on 04/18/16 & updated 03/04/17; UDS done on 04/18/16.   Complication of anesthesia    Diabetic foot infection (HCC) 03/13/2015   Diabetic foot ulcer (HCC) 02/08/2021   Diabetic ulcer of left heel associated with type 2 diabetes mellitus  (HCC) 02/08/2021   Eczema    Family history of adverse reaction to anesthesia    a.) PONV in mother and grandmother   Gas gangrene (HCC) 09/18/2022   GERD (gastroesophageal reflux disease) 05/10/2014   History of kidney stones    History of MSSA bacteremia due to osteomyelitis L foot 09/18/22 10/22/2022   HLD (hyperlipidemia)    Hyponatremia 03/13/2015   Migraines    Motion sickness    MSSA bacteremia 09/20/2022   Osteomyelitis (HCC) 09/18/2022   Other acute osteomyelitis, left ankle and foot (HCC) 02/13/2022   Panic attacks    Peripheral edema 04/27/2018   Pneumonia    PONV (postoperative nausea and vomiting)    Sepsis secondary to diabetic foot infection 03/13/2015   Sepsis secondary to diabetic foot infection 03/13/2015   T2DM (type 2 diabetes mellitus) (HCC)    Takotsubo cardiomyopathy / transient apical balooning syndrome / stress-induced cardiomyopathy 05/10/2014   Unspecified essential hypertension    Assessment: 58 year old female admitted to [facility] for shortness of breath and abdominal pain. Patient with past medical history significant for HTN, DM , left BKA, asthma, morbid obesity, NICM (EF 70% 12/08/2023), stage 3 ovarian CA on maintenance chemotherapy. Patient does not appear to have a history of afib and does not appear to be on blood thinners prior to admission. New orders to start IV heparin , 65mg  of lovenox  given last night. Hemoglobin low at 10 but stable overnight.   Goal of Therapy:  Heparin  level  0.3-0.7 units/ml Monitor platelets by anticoagulation protocol: Yes   Plan:  Give 4000 units bolus x 1 Start heparin  infusion at 1500 units/hr Check anti-Xa level in 6-8 hours and daily while on heparin  Continue to monitor H&H and platelets  Dempsey Blush PharmD., BCPS Clinical Pharmacist 12/25/2023 9:12 AM     [1]  Allergies Allergen Reactions   Shellfish Allergy Anaphylaxis   Codeine Other (See Comments)    Migraine  Other Reaction(s): Other (See  Comments)  Reaction:  Severe migraines   Morphine  And Codeine Other (See Comments)    Reaction:  Severe migraines    Rosuvastatin     Other reaction(s): Muscle Pain   Metformin  And Related Diarrhea   Percocet [Oxycodone -Acetaminophen ] Nausea And Vomiting   Sulfa Antibiotics Rash   Theophyllines Itching and Rash

## 2023-12-25 NOTE — Telephone Encounter (Signed)
 Patient is currently in the ED.

## 2023-12-25 NOTE — Progress Notes (Signed)
 PHARMACY - ANTICOAGULATION CONSULT NOTE  Pharmacy Consult for heparin  Indication: atrial fibrillation  Allergies[1]  Patient Measurements: Height: 5' 9 (175.3 cm) Weight: 130.6 kg (288 lb) IBW/kg (Calculated) : 66.2 HEPARIN  DW (KG): 97.1  Vital Signs: Temp: 97.7 F (36.5 C) (12/18 1630) Temp Source: Oral (12/18 1630) BP: 134/89 (12/18 1630) Pulse Rate: 84 (12/18 1630)  Labs: Recent Labs    12/24/23 1823 12/25/23 0523 12/25/23 1650  HGB 10.0* 10.0*  --   HCT 32.0* 31.5*  --   PLT 492* 486*  --   HEPARINUNFRC  --   --  0.74*  CREATININE 0.88 1.15*  --     Estimated Creatinine Clearance: 76.5 mL/min (A) (by C-G formula based on SCr of 1.15 mg/dL (H)).   Medical History: Past Medical History:  Diagnosis Date   Abnormal cardiovascular stress test 09/21/2018   Formatting of this note might be different from the original. Lexiscan Myoview 09/16/2018 revealed mild anterior ischemia   Adverse effect of motion 05/10/2014   AKI (acute kidney injury) 10/22/2022   Anxiety    Arthritis    r knee   Asthma    Breast cyst 05/10/2014   CAD (coronary artery disease)    a.) LHC 06/04/2009 at [facility]; non-obstructive CAD. b.) CTA with FFR 10/08/2018: extensive mixed plaque proximal to mid LAD (51-69%); Coronary Ca score 217; FFR 0.71 dPDA, 0.86 mLAD, 0.87 dLCx.   CCF (congestive cardiac failure) (HCC) 06/06/2008   a.) 30% EF. b.) TTE 06/03/2011: EF >55%; triv MR. c.) TTE 04/27/2018: EF 55%; mild LVH; triv PR, mild MR/TR; G1DD.   Cellulitis of left lower extremity 09/18/2022   Chest pain with high risk for cardiac etiology 07/02/2016   Chronic use of opiate drug for therapeutic purpose 02/13/2022   Formatting of this note might be different from the original. [facility] Pain Contract signed on 04/18/16 & updated 03/04/17; UDS done on 04/18/16.   Complication of anesthesia    Diabetic foot infection (HCC) 03/13/2015   Diabetic foot ulcer (HCC) 02/08/2021   Diabetic ulcer of left heel associated  with type 2 diabetes mellitus (HCC) 02/08/2021   Eczema    Family history of adverse reaction to anesthesia    a.) PONV in mother and grandmother   Gas gangrene (HCC) 09/18/2022   GERD (gastroesophageal reflux disease) 05/10/2014   History of kidney stones    History of MSSA bacteremia due to osteomyelitis L foot 09/18/22 10/22/2022   HLD (hyperlipidemia)    Hyponatremia 03/13/2015   Migraines    Motion sickness    MSSA bacteremia 09/20/2022   Osteomyelitis (HCC) 09/18/2022   Other acute osteomyelitis, left ankle and foot (HCC) 02/13/2022   Panic attacks    Peripheral edema 04/27/2018   Pneumonia    PONV (postoperative nausea and vomiting)    Sepsis secondary to diabetic foot infection 03/13/2015   Sepsis secondary to diabetic foot infection 03/13/2015   T2DM (type 2 diabetes mellitus) (HCC)    Takotsubo cardiomyopathy / transient apical balooning syndrome / stress-induced cardiomyopathy 05/10/2014   Unspecified essential hypertension    Assessment: 58 year old female admitted to [facility] for shortness of breath and abdominal pain. Patient with past medical history significant for HTN, DM , left BKA, asthma, morbid obesity, NICM (EF 70% 12/08/2023), stage 3 ovarian CA on maintenance chemotherapy. . New orders to start IV heparin , 65mg  of lovenox  given last night. Hemoglobin low at 10 but stable overnight.   Goal of Therapy:  Heparin  level 0.3-0.7 units/ml Monitor platelets  by anticoagulation protocol: Yes   Date Time Results Comments 12/18 1650 HL=0.74 SUPRAtherapeutic, rate 1500 units/hr  Plan:  Heparin  level slightly above goal range, will decrease current rate to 1400 units/hr Check HL 6 hours after rate change Continue to monitor H&H and platelets  Rico Massar Rodriguez-Guzman PharmD, BCPS 12/25/2023 5:15 PM       [1]  Allergies Allergen Reactions   Shellfish Allergy Anaphylaxis   Codeine Other (See Comments)    Migraine  Other Reaction(s): Other (See  Comments)  Reaction:  Severe migraines   Morphine  And Codeine Other (See Comments)    Reaction:  Severe migraines    Rosuvastatin     Other reaction(s): Muscle Pain   Metformin  And Related Diarrhea   Percocet [Oxycodone -Acetaminophen ] Nausea And Vomiting   Sulfa Antibiotics Rash   Theophyllines Itching and Rash

## 2023-12-25 NOTE — Consult Note (Addendum)
 [name] CLINIC CARDIOLOGY CONSULT NOTE       Patient ID: Nancy [name] MRN: [id number] DOB/AGE: 1965-03-05 58 y.o.  Admit date: 12/24/2023 Referring Physician Dr. Delayne Solian Primary Physician Orlean Alan HERO, FNP  Primary Cardiologist none Reason for Consultation pericardial effusion  HPI: Nancy [name] is a 58 y.o. female  with a past medical history of HTN, DM , left BKA, asthma, morbid obesity, NICM (EF 70% 12/08/2023), stage 3 ovarian CA on maintenance chemotherapy, currently on hold due to chemotherapy induced diarrhea who presented to the ED on 12/24/2023 for abnormal CT results which showed large pericardial effusion. Cardiology was consulted for further evaluation.   Patient was advised to come to the ED for further evaluation after outpatient CT yesterday showed large pericardial effusion.  She was diagnosed with this last month at [facility]  Workup in the ED notable for creatinine 0.88, potassium 4.4, hemoglobin 10.0, WBC 9.6. Troponins 19 > 20, BNP 211. EKG in the ED sinus tach rate 101 bpm.   At the time my evaluation this morning, patient was sitting upright on side of hospital bed.  We discussed her symptoms in further detail.  She endorses abdominal pain coming up into her lower chest which has been present for the last few weeks and relatively stable.  She describes the chest discomfort as sharp and constant.  She also reports palpitations this morning with associated dizziness and lightheadedness.  Reports mild lower extremity edema.  Has had issues with nausea and vomiting.  Also endorses shortness of breath.  She is noted to be in atrial fibrillation RVR on telemetry.  Review of systems complete and found to be negative unless listed above    Past Medical History:  Diagnosis Date   Abnormal cardiovascular stress test 09/21/2018   Formatting of this note might be different from the original. Lexiscan Myoview 09/16/2018 revealed mild anterior ischemia   Adverse  effect of motion 05/10/2014   AKI (acute kidney injury) 10/22/2022   Anxiety    Arthritis    r knee   Asthma    Breast cyst 05/10/2014   CAD (coronary artery disease)    a.) LHC 06/04/2009 at [facility]; non-obstructive CAD. b.) CTA with FFR 10/08/2018: extensive mixed plaque proximal to mid LAD (51-69%); Coronary Ca score 217; FFR 0.71 dPDA, 0.86 mLAD, 0.87 dLCx.   CCF (congestive cardiac failure) (HCC) 06/06/2008   a.) 30% EF. b.) TTE 06/03/2011: EF >55%; triv MR. c.) TTE 04/27/2018: EF 55%; mild LVH; triv PR, mild MR/TR; G1DD.   Cellulitis of left lower extremity 09/18/2022   Chest pain with high risk for cardiac etiology 07/02/2016   Chronic use of opiate drug for therapeutic purpose 02/13/2022   Formatting of this note might be different from the original. [facility] Pain Contract signed on 04/18/16 & updated 03/04/17; UDS done on 04/18/16.   Complication of anesthesia    Diabetic foot infection (HCC) 03/13/2015   Diabetic foot ulcer (HCC) 02/08/2021   Diabetic ulcer of left heel associated with type 2 diabetes mellitus (HCC) 02/08/2021   Eczema    Family history of adverse reaction to anesthesia    a.) PONV in mother and grandmother   Gas gangrene (HCC) 09/18/2022   GERD (gastroesophageal reflux disease) 05/10/2014   History of kidney stones    History of MSSA bacteremia due to osteomyelitis L foot 09/18/22 10/22/2022   HLD (hyperlipidemia)    Hyponatremia 03/13/2015   Migraines    Motion sickness    MSSA bacteremia 09/20/2022  Osteomyelitis (HCC) 09/18/2022   Other acute osteomyelitis, left ankle and foot (HCC) 02/13/2022   Panic attacks    Peripheral edema 04/27/2018   Pneumonia    PONV (postoperative nausea and vomiting)    Sepsis secondary to diabetic foot infection 03/13/2015   Sepsis secondary to diabetic foot infection 03/13/2015   T2DM (type 2 diabetes mellitus) (HCC)    Takotsubo cardiomyopathy / transient apical balooning syndrome / stress-induced cardiomyopathy 05/10/2014    Unspecified essential hypertension     Past Surgical History:  Procedure Laterality Date   AMPUTATION Left 09/19/2022   Procedure: AMPUTATION BELOW KNEE;  Surgeon: Jama Cordella MATSU, MD;  Location: [facility] ORS;  Service: Vascular;  Laterality: Left;   AMPUTATION Left 09/24/2022   Procedure: AMPUTATION BELOW KNEE WITH WOUND CLOSURE;  Surgeon: Jama Cordella MATSU, MD;  Location: [facility] OR[facility];  Service: Vascular;  Laterality: Left;   AMPUTATION TOE Left 03/16/2015   Procedure: left fifth toe amputation with incision and drainage;  Surgeon: Eva Gay, DPM;  Location: [facility] OR[facility];  Service: Podiatry;  Laterality: Left;   APPLICATION OF WOUND VAC Left 02/14/2021   Procedure: APPLICATION OF WOUND VAC;  Surgeon: Gay Eva, DPM;  Location: [facility] OR[facility];  Service: Podiatry;  Laterality: Left;   APPLICATION OF WOUND VAC Left 04/16/2021   Procedure: APPLICATION OF WOUND VAC;  Surgeon: Lowery Estefana RAMAN, DO;  Location: [facility];  Service: Plastics;  Laterality: Left;   CARDIAC CATHETERIZATION Left 06/04/2009   Procedure: CARDIAC CATHETERIZATION; Location: [facility]   CHOLECYSTECTOMY     DEBRIDEMENT AND CLOSURE WOUND Left 04/16/2021   Procedure: DEBRIDEMENT AND CLOSURE WOUND;  Surgeon: Lowery Estefana RAMAN, DO;  Location: [facility];  Service: Plastics;  Laterality: Left;  1 hour   IR IMAGING GUIDED PORT INSERTION  11/18/2022   IRRIGATION AND DEBRIDEMENT FOOT Left 02/08/2021   Procedure: IRRIGATION AND DEBRIDEMENT FOOT - LFT HEEL ULCER;  Surgeon: Gay Eva, DPM;  Location: [facility];  Service: Podiatry;  Laterality: Left;   IRRIGATION AND DEBRIDEMENT FOOT Left 02/14/2021   Procedure: IRRIGATION AND DEBRIDEMENT LEFT HEEL;  Surgeon: Gay Eva, DPM;  Location: [facility];  Service: Podiatry;  Laterality: Left;   kidney stone removal     KNEE ARTHROSCOPY W/ MENISCAL REPAIR     NOSE SURGERY  01/07/1989   due to fracture   TEE WITHOUT CARDIOVERSION N/A 09/26/2022    Procedure: TRANSESOPHAGEAL ECHOCARDIOGRAM;  Surgeon: Dewane Shiner, DO;  Location: [facility];  Service: Cardiovascular;  Laterality: N/A;   TOE AMPUTATION     second toe   TONSILLECTOMY     TOTAL KNEE ARTHROPLASTY  01/11/2011   Procedure: TOTAL KNEE ARTHROPLASTY;  Surgeon: Norleen LITTIE Gavel;  Location: [facility];  Service: Orthopedics;  Laterality: Right;  COMPUTER ASSISTED TOTAL KNEE REPLACEMENT    (Not in a hospital admission)  Social History   Socioeconomic History   Marital status: Married    Spouse name: Not on file   Number of children: Not on file   Years of education: Not on file   Highest education level: Not on file  Occupational History   Not on file  Tobacco Use   Smoking status: Former    Current packs/day: 0.00    Types: Cigarettes    Quit date: 03/12/1985    Years since quitting: 38.8   Smokeless tobacco: Never  Vaping Use   Vaping status: Never Used  Substance and Sexual Activity   Alcohol use: No   Drug use: No  Sexual activity: Yes    Birth control/protection: Post-menopausal  Other Topics Concern   Not on file  Social History Narrative   Wife, Barnie, at bedside. One indoor pet, dog.   Social Drivers of Health   Tobacco Use: Medium Risk (12/24/2023)   Patient History    Smoking Tobacco Use: Former    Smokeless Tobacco Use: Never    Passive Exposure: Not on file  Financial Resource Strain: Medium Risk (12/01/2023)   Received from [facility]   Overall Financial Resource Strain (CARDIA)    Difficulty of Paying Living Expenses: Somewhat hard  Food Insecurity: No Food Insecurity (12/24/2023)   Epic    Worried About Running Out of Food in the Last Year: Never true    Ran Out of Food in the Last Year: Never true  Transportation Needs: No Transportation Needs (12/24/2023)   Epic    Lack of Transportation (Medical): No    Lack of Transportation (Non-Medical): No  Recent Concern: Transportation Needs - Unmet Transportation Needs  (12/01/2023)   Received from [facility] - Transportation    In the past 12 months, has lack of transportation kept you from medical appointments or from getting medications?: Yes    Lack of Transportation (Non-Medical): Yes  Physical Activity: Not on file  Stress: Not on file  Social Connections: Not on file  Intimate Partner Violence: Not At Risk (10/22/2022)   Humiliation, Afraid, Rape, and Kick questionnaire    Fear of Current or Ex-Partner: No    Emotionally Abused: No    Physically Abused: No    Sexually Abused: No  Depression (PHQ2-9): Low Risk (11/04/2023)   Depression (PHQ2-9)    PHQ-2 Score: 1  Alcohol Screen: Low Risk (12/24/2023)   Alcohol Screen    Last Alcohol Screening Score (AUDIT): 0  Housing: Low Risk  (12/01/2023)   Received from [facility]   Epic    In the last 12 months, was there a time when you were not able to pay the mortgage or rent on time?: No    In the past 12 months, how many times have you moved where you were living?: 0    At any time in the past 12 months, were you homeless or living in a shelter (including now)?: No  Utilities: Not At Risk (12/01/2023)   Received from [facility]   Epic    In the past 12 months has the electric, gas, oil, or water  company threatened to shut off services in your home?: No  Health Literacy: Not on file    Family History  Problem Relation Age of Onset   Transient ischemic attack Mother    Asthma Mother    Hypertension Mother    Lung cancer Father 38   Hypertension Father    Asthma Sister    Anxiety disorder Sister    Asthma Sister    Hypertension Brother    Ovarian cancer Maternal Aunt 23   Melanoma Maternal Uncle        dx 61s   Lung cancer Paternal Aunt 7   Stomach cancer Maternal Grandmother    Colon cancer Maternal Grandmother    Lymphoma Maternal Grandmother    Melanoma Maternal Grandfather 72       sun exposure   Lung cancer  Paternal Grandmother 58   Kidney cancer Paternal Grandfather        dx early   Crohn's disease Other  GER disease Other    Breast cancer Neg Hx      Vitals:   12/25/23 0730 12/25/23 0738 12/25/23 0802 12/25/23 1222  BP: 100/68   (!) 127/90  Pulse: 94 95  83  Resp: 14 11  11   Temp:  97.7 F (36.5 C) 97.6 F (36.4 C) 98.2 F (36.8 C)  TempSrc:   Oral   SpO2: 96% 94%  95%  Weight:      Height:        PHYSICAL EXAM General: Ill-appearing female, well nourished, in no acute distress. HEENT: Normocephalic and atraumatic. Neck: No JVD.  Lungs: Normal respiratory effort on room air. Clear bilaterally to auscultation. No wheezes, crackles, rhonchi.  Heart: Irregularly irregular, elevated rate. Normal S1 and S2 without gallops or murmurs.  Abdomen: Non-distended appearing.  Msk: Normal strength and tone for age. Extremities: Warm and well perfused. No clubbing, cyanosis.  Trace edema.  Neuro: Alert and oriented X 3. Psych: Answers questions appropriately.   Labs: Basic Metabolic Panel: Recent Labs    12/24/23 1823 12/25/23 0523  NA 131* 135  K 4.4 4.3  CL 96* 99  CO2 23 24  GLUCOSE 188* 178*  BUN 27* 31*  CREATININE 0.88 1.15*  CALCIUM 8.8* 8.8*   Liver Function Tests: Recent Labs    12/24/23 2128  AST 19  ALT 31  ALKPHOS 99  BILITOT 0.5  PROT 6.9  ALBUMIN 3.7   No results for input(s): LIPASE, AMYLASE in the last 72 hours. CBC: Recent Labs    12/24/23 1823 12/25/23 0523  WBC 9.6 7.8  HGB 10.0* 10.0*  HCT 32.0* 31.5*  MCV 102.6* 101.0*  PLT 492* 486*   Cardiac Enzymes: No results for input(s): CKTOTAL, CKMB, CKMBINDEX, TROPONINIHS in the last 72 hours. BNP: No results for input(s): BNP in the last 72 hours. D-Dimer: No results for input(s): DDIMER in the last 72 hours. Hemoglobin A1C: No results for input(s): HGBA1C in the last 72 hours. Fasting Lipid Panel: No results for input(s): CHOL, HDL, LDLCALC, TRIG,  CHOLHDL, LDLDIRECT in the last 72 hours. Thyroid  Function Tests: No results for input(s): TSH, T4TOTAL, T3FREE, THYROIDAB in the last 72 hours.  Invalid input(s): FREET3 Anemia Panel: No results for input(s): VITAMINB12, FOLATE, FERRITIN, TIBC, IRON , RETICCTPCT in the last 72 hours.   Radiology: DG Chest 2 View Result Date: 12/24/2023 EXAM: 2 VIEW(S) XRAY OF THE CHEST 12/24/2023 06:38:21 PM COMPARISON: None available. CLINICAL HISTORY: shortness of breath FINDINGS: LINES, TUBES AND DEVICES: Right IJ approach Port-A-Cath in place with tip projecting at the superior right atrium. LUNGS AND PLEURA: No focal pulmonary opacity. No pleural effusion. No pneumothorax. HEART AND MEDIASTINUM: Cardiomegaly. Aortic atherosclerosis. BONES AND SOFT TISSUES: No acute osseous abnormality. IMPRESSION: 1. Right IJ approach Port-A-Cath with tip projecting at the superior right atrium. 2. Cardiomegaly. 3. Aortic atherosclerosis. Electronically signed by: Greig Pique MD 12/24/2023 06:51 PM EST RP Workstation: [id number]   CT CHEST ABDOMEN PELVIS W CONTRAST Result Date: 12/24/2023 EXAM: CT CHEST, ABDOMEN AND PELVIS WITH CONTRAST 12/24/2023 06:14:55 PM TECHNIQUE: CT of the chest, abdomen and pelvis was performed with the administration of 100 mL of iohexol  (OMNIPAQUE ) 300 MG/ML solution. Multiplanar reformatted images are provided for review. Automated exposure control, iterative reconstruction, and/or weight based adjustment of the mA/kV was utilized to reduce the radiation dose to as low as reasonably achievable. COMPARISON: CT chest abdomen and pelvis 10/22/2023. CLINICAL HISTORY: ovarian cancer ovarian cancer FINDINGS: CHEST: MEDIASTINUM AND LYMPH NODES: There is a new  large pericardial effusion. Right chest port catheter tip ends in the distal SVC. The central airways are clear. No mediastinal, hilar or axillary lymphadenopathy. LUNGS AND PLEURA: There is a new small right pleural effusion.  There is a calcified granuloma in the right upper lobe. No focal consolidation or pulmonary edema. No pneumothorax. ABDOMEN AND PELVIS: LIVER: The liver is unremarkable. GALLBLADDER AND BILE DUCTS: The gallbladder is surgically absent. No biliary ductal dilatation. SPLEEN: No acute abnormality. PANCREAS: No acute abnormality. ADRENAL GLANDS: Low attenuation left adrenal adenoma or myelolipoma measures 3 cm; this is better seen on today's study when compared to the prior examination. The right adrenal gland appears normal. KIDNEYS, URETERS AND BLADDER: There is a small fat containing lesion in the left kidney posteriorly measuring 9 mm, likely angiomyolipoma. Subcentimeter hypodensity may represent a cyst in the inferior pole of the left kidney. There is a 5 mm fat containing lesion in the inferior pole of the right kidney which is unchanged, likely angiomyolipoma. No stones in the kidneys or ureters. No hydronephrosis. No perinephric or periureteral stranding. Urinary bladder is unremarkable. GI AND BOWEL: The appendix is prominent in size measuring up to 8 mm. There is some trace surrounding inflammatory stranding. There is no fluid collection or perforation. Stomach demonstrates no acute abnormality. There is no bowel obstruction. REPRODUCTIVE ORGANS: The uterus is surgically absent. The ovaries are not definitively seen. PERITONEUM AND RETROPERITONEUM: There is trace free fluid in the right upper quadrant which is new from prior. No free air. VASCULATURE: Aorta is normal in caliber. There are atherosclerotic calcifications of the aorta. ABDOMINAL AND PELVIS LYMPH NODES: There are scattered nonenlarged retroperitoneal lymph nodes which are similar to the prior study. BONES AND SOFT TISSUES: No acute osseous abnormality. No focal soft tissue abnormality. IMPRESSION: 1. New large pericardial effusion. 2. New small right pleural effusion. 3. Prominent appendix with trace surrounding inflammatory stranding, without  fluid collection or perforation. Please correlate clinically for early acute appendicitis. 4. Low attenuation 3 cm left adrenal lesion, favored adenoma or myelolipoma. 5. No evidence of metastatic disease. Electronically signed by: Greig Pique MD 12/24/2023 06:42 PM EST RP Workstation: [id number]   DG PAIN CLINIC C-ARM 1-60 MIN NO REPORT Result Date: 11/26/2023 Fluoro was used, but no Radiologist interpretation will be provided. Please refer to NOTES tab for provider progress note.   ECHO ordered  TELEMETRY (personally reviewed): Atrial fibrillation RVR rate 150s  EKG (personally reviewed): sinus tach rate 101 bpm  Data reviewed by me 12/25/2023: last 24h vitals tele labs imaging I/O ED provider note, admission H&P  Principal Problem:   Pericardial effusion, without tamponade Active Problems:   Essential hypertension   Asthma without status asthmaticus   Neuropathy, diabetic (HCC)   Migraine without aura and without status migrainosus, not intractable   Diabetes mellitus, type 2 (HCC)   S/P left  BKA 09/24/22 secondary to osteomyelitis (below knee amputation) (HCC)   Obesity, Class III, BMI 40-49.9 (morbid obesity) (HCC)   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 female  with a past medical history of HTN, DM , left BKA, asthma, morbid obesity, NICM (EF 70% 12/08/2023), stage 3 ovarian CA on maintenance chemotherapy, currently on hold due to  chemotherapy induced diarrhea who presented to the ED on 12/24/2023 for abnormal CT results which showed large pericardial effusion. Cardiology was consulted for further evaluation.   # Atrial fibrillation RVR # New onset atrial fibrillation #  Pericardial effusion # Ovarian cancer Patient was sent to the ED for evaluation of pericardial effusion which was noted on CT chest yesterday to be large.  This was originally diagnosed during hospitalization last month at [facility], echo at that time reportedly showed moderate pericardial effusion.  She is in atrial fibrillation RVR on telemetry today 12/25/2023. - Start heparin  for anticoagulation, no plan for further surgical intervention for possible appendicitis noted on CT. - Start IV amiodarone  bolus and infusion. - Will plan for echocardiogram to further assess pericardial effusion.  No evidence of tamponade thus far. - Further management per primary team.  ADDENDUM: Patient with large pericardial effusion on TTE this afternoon, personally reviewed by Dr. Ammon at bedside.  No evidence of tamponade, patient remains hemodynamically stable.  Given increase in size of pericardial effusion from hospitalization at [facility] last month, will plan for pericardiocentesis tomorrow morning at 8:30 AM with Dr. Darron.  Patient has been updated regarding this plan and is amenable to proceeding with this.  Written consent will be obtained. Attempted to call patient's wife to update regarding plan but no answer. Stop heparin  1 hour prior to procedure - RN made aware and order updated to stop at 7:30 AM.  This patient's plan of care was discussed and created with Dr. Ammon and he is in agreement.  Signed: Danita Bloch, PA-C  12/25/2023, 2:01 PM [facility]

## 2023-12-25 NOTE — ED Notes (Signed)
 CCMD called at this time.

## 2023-12-26 ENCOUNTER — Encounter: Admission: EM | Disposition: A | Payer: Self-pay | Source: Home / Self Care | Attending: Internal Medicine

## 2023-12-26 ENCOUNTER — Inpatient Hospital Stay: Admit: 2023-12-26 | Discharge: 2023-12-26 | Disposition: A | Attending: Student

## 2023-12-26 ENCOUNTER — Encounter: Payer: Self-pay | Admitting: Cardiovascular Disease

## 2023-12-26 DIAGNOSIS — I3139 Other pericardial effusion (noninflammatory): Secondary | ICD-10-CM

## 2023-12-26 HISTORY — PX: PERICARDIOCENTESIS: CATH118255

## 2023-12-26 LAB — BASIC METABOLIC PANEL WITH GFR
Anion gap: 13 (ref 5–15)
BUN: 36 mg/dL — ABNORMAL HIGH (ref 6–20)
CO2: 22 mmol/L (ref 22–32)
Calcium: 8.9 mg/dL (ref 8.9–10.3)
Chloride: 98 mmol/L (ref 98–111)
Creatinine, Ser: 1.28 mg/dL — ABNORMAL HIGH (ref 0.44–1.00)
GFR, Estimated: 48 mL/min — ABNORMAL LOW
Glucose, Bld: 199 mg/dL — ABNORMAL HIGH (ref 70–99)
Potassium: 4.5 mmol/L (ref 3.5–5.1)
Sodium: 133 mmol/L — ABNORMAL LOW (ref 135–145)

## 2023-12-26 LAB — MRSA NEXT GEN BY PCR, NASAL: MRSA by PCR Next Gen: NOT DETECTED

## 2023-12-26 LAB — ECHOCARDIOGRAM LIMITED
Height: 69 in
Weight: 4608 [oz_av]

## 2023-12-26 LAB — HEPARIN LEVEL (UNFRACTIONATED)
Heparin Unfractionated: 0.48 [IU]/mL (ref 0.30–0.70)
Heparin Unfractionated: 0.21 [IU]/mL — ABNORMAL LOW (ref 0.30–0.70)

## 2023-12-26 LAB — GLUCOSE, CAPILLARY
Glucose-Capillary: 173 mg/dL — ABNORMAL HIGH (ref 70–99)
Glucose-Capillary: 143 mg/dL — ABNORMAL HIGH (ref 70–99)
Glucose-Capillary: 197 mg/dL — ABNORMAL HIGH (ref 70–99)

## 2023-12-26 LAB — CBC
HCT: 33.7 % — ABNORMAL LOW (ref 36.0–46.0)
Hemoglobin: 10.4 g/dL — ABNORMAL LOW (ref 12.0–15.0)
MCH: 31.7 pg (ref 26.0–34.0)
MCHC: 30.9 g/dL (ref 30.0–36.0)
MCV: 102.7 fL — ABNORMAL HIGH (ref 80.0–100.0)
Platelets: 454 K/uL — ABNORMAL HIGH (ref 150–400)
RBC: 3.28 MIL/uL — ABNORMAL LOW (ref 3.87–5.11)
RDW: 16.8 % — ABNORMAL HIGH (ref 11.5–15.5)
WBC: 10.5 K/uL (ref 4.0–10.5)
nRBC: 0 % (ref 0.0–0.2)

## 2023-12-26 LAB — BODY FLUID CELL COUNT WITH DIFFERENTIAL
Eos, Fluid: 0 %
Lymphs, Fluid: 16 %
Monocyte-Macrophage-Serous Fluid: 5 %
Neutrophil Count, Fluid: 79 %
Total Nucleated Cell Count, Fluid: 3260 uL

## 2023-12-26 LAB — CBG MONITORING, ED: Glucose-Capillary: 189 mg/dL — ABNORMAL HIGH (ref 70–99)

## 2023-12-26 LAB — MAGNESIUM: Magnesium: 2.2 mg/dL (ref 1.7–2.4)

## 2023-12-26 SURGERY — PERICARDIOCENTESIS
Anesthesia: Moderate Sedation

## 2023-12-26 MED ORDER — CHLORHEXIDINE GLUCONATE CLOTH 2 % EX PADS
6.0000 | MEDICATED_PAD | Freq: Every day | CUTANEOUS | Status: DC
  Administered 2023-12-26 – 2023-12-30 (×4): 6 via TOPICAL

## 2023-12-26 MED ORDER — LIDOCAINE HCL (PF) 1 % IJ SOLN
INTRAMUSCULAR | Status: DC | PRN
  Administered 2023-12-26: 20 mL

## 2023-12-26 MED ORDER — APIXABAN 5 MG PO TABS
5.0000 mg | ORAL_TABLET | Freq: Two times a day (BID) | ORAL | Status: DC
  Administered 2023-12-28 – 2023-12-30 (×5): 5 mg via ORAL
  Filled 2023-12-26 (×6): qty 1

## 2023-12-26 MED ORDER — FAMOTIDINE 20 MG PO TABS: 20.0000 mg | ORAL_TABLET | Freq: Two times a day (BID) | ORAL | Status: DC

## 2023-12-26 MED ORDER — AMIODARONE HCL IN DEXTROSE 360-4.14 MG/200ML-% IV SOLN
INTRAVENOUS | Status: AC
  Filled 2023-12-26: qty 200

## 2023-12-26 MED ORDER — FENTANYL CITRATE (PF) 100 MCG/2ML IJ SOLN
INTRAMUSCULAR | Status: AC
  Filled 2023-12-26: qty 2

## 2023-12-26 MED ORDER — HEPARIN (PORCINE) IN NACL 1000-0.9 UT/500ML-% IV SOLN
INTRAVENOUS | Status: DC | PRN
  Administered 2023-12-26: 500 mL

## 2023-12-26 MED ORDER — PANTOPRAZOLE SODIUM 40 MG PO TBEC
40.0000 mg | DELAYED_RELEASE_TABLET | Freq: Every day | ORAL | Status: DC
  Administered 2023-12-27 – 2023-12-30 (×4): 40 mg via ORAL
  Filled 2023-12-26 (×4): qty 1

## 2023-12-26 MED ORDER — LIDOCAINE HCL 1 % IJ SOLN
INTRAMUSCULAR | Status: AC
  Filled 2023-12-26: qty 40

## 2023-12-26 MED ORDER — MIDAZOLAM HCL (PF) 2 MG/2ML IJ SOLN
INTRAMUSCULAR | Status: DC | PRN
  Administered 2023-12-26: 1 mg via INTRAVENOUS

## 2023-12-26 MED ORDER — FENTANYL CITRATE (PF) 100 MCG/2ML IJ SOLN
INTRAMUSCULAR | Status: DC | PRN
  Administered 2023-12-26 (×2): 25 ug via INTRAVENOUS

## 2023-12-26 MED ORDER — MIDAZOLAM HCL 2 MG/2ML IJ SOLN
INTRAMUSCULAR | Status: AC
  Filled 2023-12-26: qty 2

## 2023-12-26 SURGICAL SUPPLY — 5 items
DRAPE BRACHIAL (DRAPES) IMPLANT
KIT MICRO 4FR STIFFIN 15 (SHEATH) IMPLANT
KIT SYRINGE INJ CVI SPIKEX1 (MISCELLANEOUS) IMPLANT
SUT SILK 0 FSL (SUTURE) IMPLANT
TRAY PERICARDIOCENTESIS 6FX60 (TRAY / TRAY PROCEDURE) IMPLANT

## 2023-12-26 NOTE — Progress Notes (Signed)
 PHARMACY - ANTICOAGULATION CONSULT NOTE  Pharmacy Consult for heparin  Indication: atrial fibrillation  Allergies[1]  Patient Measurements: Height: 5' 9 (175.3 cm) Weight: 130.6 kg (288 lb) IBW/kg (Calculated) : 66.2 HEPARIN  DW (KG): 97.1  Vital Signs: BP: 145/90 (12/19 0000)  Labs: Recent Labs    12/24/23 1823 12/25/23 0523 12/25/23 1650 12/26/23 0202 12/26/23 0729  HGB 10.0* 10.0*  --  10.4*  --   HCT 32.0* 31.5*  --  33.7*  --   PLT 492* 486*  --  454*  --   HEPARINUNFRC  --   --  0.74* 0.48 0.21*  CREATININE 0.88 1.15*  --  1.28*  --     Estimated Creatinine Clearance: 68.7 mL/min (A) (by C-G formula based on SCr of 1.28 mg/dL (H)).   Medical History: Past Medical History:  Diagnosis Date   Abnormal cardiovascular stress test 09/21/2018   Formatting of this note might be different from the original. Lexiscan Myoview 09/16/2018 revealed mild anterior ischemia   Adverse effect of motion 05/10/2014   AKI (acute kidney injury) 10/22/2022   Anxiety    Arthritis    r knee   Asthma    Breast cyst 05/10/2014   CAD (coronary artery disease)    a.) LHC 06/04/2009 at [facility]; non-obstructive CAD. b.) CTA with FFR 10/08/2018: extensive mixed plaque proximal to mid LAD (51-69%); Coronary Ca score 217; FFR 0.71 dPDA, 0.86 mLAD, 0.87 dLCx.   CCF (congestive cardiac failure) (HCC) 06/06/2008   a.) 30% EF. b.) TTE 06/03/2011: EF >55%; triv MR. c.) TTE 04/27/2018: EF 55%; mild LVH; triv PR, mild MR/TR; G1DD.   Cellulitis of left lower extremity 09/18/2022   Chest pain with high risk for cardiac etiology 07/02/2016   Chronic use of opiate drug for therapeutic purpose 02/13/2022   Formatting of this note might be different from the original. [facility] Pain Contract signed on 04/18/16 & updated 03/04/17; UDS done on 04/18/16.   Complication of anesthesia    Diabetic foot infection (HCC) 03/13/2015   Diabetic foot ulcer (HCC) 02/08/2021   Diabetic ulcer of left heel associated with type 2  diabetes mellitus (HCC) 02/08/2021   Eczema    Family history of adverse reaction to anesthesia    a.) PONV in mother and grandmother   Gas gangrene (HCC) 09/18/2022   GERD (gastroesophageal reflux disease) 05/10/2014   History of kidney stones    History of MSSA bacteremia due to osteomyelitis L foot 09/18/22 10/22/2022   HLD (hyperlipidemia)    Hyponatremia 03/13/2015   Migraines    Motion sickness    MSSA bacteremia 09/20/2022   Osteomyelitis (HCC) 09/18/2022   Other acute osteomyelitis, left ankle and foot (HCC) 02/13/2022   Panic attacks    Peripheral edema 04/27/2018   Pneumonia    PONV (postoperative nausea and vomiting)    Sepsis secondary to diabetic foot infection 03/13/2015   Sepsis secondary to diabetic foot infection 03/13/2015   T2DM (type 2 diabetes mellitus) (HCC)    Takotsubo cardiomyopathy / transient apical balooning syndrome / stress-induced cardiomyopathy 05/10/2014   Unspecified essential hypertension    Assessment: 58 year old female admitted to [facility] for shortness of breath and abdominal pain. Patient with past medical history significant for HTN, DM , left BKA, asthma, morbid obesity, NICM (EF 70% 12/08/2023), stage 3 ovarian CA on maintenance chemotherapy. . New orders to start IV heparin , 65mg  of lovenox  given last night. Hemoglobin low at 10 but stable overnight.   Goal of Therapy:  Heparin   level 0.3-0.7 units/ml Monitor platelets by anticoagulation protocol: Yes   Date Time Results Comments 12/18 1650 HL= 0.74 SUPRAtherapeutic, rate 1500 units/hr 12/19   0202   HL= 0.48         Therapeutic X 1  12/19 0729 HL = 0.21 SUBtherapeutic  Plan:  Heparin  level subtherapeutic in setting of heparin  infusion being stopped at 0653 Per cards, patient is scheduled for pericardiocentesis today (12/19) - plan to start Eliquis  later in the day F/u transition to Eliquis  Continue to monitor CBC and signs/symptoms of bleeding  Thank you for involving pharmacy in this  patient's care.   Damien Napoleon, PharmD Clinical Pharmacist 12/26/2023 8:25 AM     [1]  Allergies Allergen Reactions   Shellfish Allergy Anaphylaxis   Codeine Other (See Comments)    Migraine  Other Reaction(s): Other (See Comments)  Reaction:  Severe migraines   Morphine  And Codeine Other (See Comments)    Reaction:  Severe migraines    Rosuvastatin     Other reaction(s): Muscle Pain   Metformin  And Related Diarrhea   Percocet [Oxycodone -Acetaminophen ] Nausea And Vomiting   Sulfa Antibiotics Rash   Theophyllines Itching and Rash

## 2023-12-26 NOTE — Progress Notes (Signed)
 PHARMACY - ANTICOAGULATION CONSULT NOTE  Pharmacy Consult for heparin  Indication: atrial fibrillation  Allergies[1]  Patient Measurements: Height: 5' 9 (175.3 cm) Weight: 130.6 kg (288 lb) IBW/kg (Calculated) : 66.2 HEPARIN  DW (KG): 97.1  Vital Signs: Temp: 97.7 F (36.5 C) (12/18 1630) Temp Source: Oral (12/18 1630) BP: 145/90 (12/19 0000) Pulse Rate: 84 (12/18 1630)  Labs: Recent Labs    12/24/23 1823 12/25/23 0523 12/25/23 1650 12/26/23 0202  HGB 10.0* 10.0*  --  10.4*  HCT 32.0* 31.5*  --  33.7*  PLT 492* 486*  --  454*  HEPARINUNFRC  --   --  0.74* 0.48  CREATININE 0.88 1.15*  --  1.28*    Estimated Creatinine Clearance: 68.7 mL/min (A) (by C-G formula based on SCr of 1.28 mg/dL (H)).   Medical History: Past Medical History:  Diagnosis Date   Abnormal cardiovascular stress test 09/21/2018   Formatting of this note might be different from the original. Lexiscan Myoview 09/16/2018 revealed mild anterior ischemia   Adverse effect of motion 05/10/2014   AKI (acute kidney injury) 10/22/2022   Anxiety    Arthritis    r knee   Asthma    Breast cyst 05/10/2014   CAD (coronary artery disease)    a.) LHC 06/04/2009 at [facility]; non-obstructive CAD. b.) CTA with FFR 10/08/2018: extensive mixed plaque proximal to mid LAD (51-69%); Coronary Ca score 217; FFR 0.71 dPDA, 0.86 mLAD, 0.87 dLCx.   CCF (congestive cardiac failure) (HCC) 06/06/2008   a.) 30% EF. b.) TTE 06/03/2011: EF >55%; triv MR. c.) TTE 04/27/2018: EF 55%; mild LVH; triv PR, mild MR/TR; G1DD.   Cellulitis of left lower extremity 09/18/2022   Chest pain with high risk for cardiac etiology 07/02/2016   Chronic use of opiate drug for therapeutic purpose 02/13/2022   Formatting of this note might be different from the original. [facility] Pain Contract signed on 04/18/16 & updated 03/04/17; UDS done on 04/18/16.   Complication of anesthesia    Diabetic foot infection (HCC) 03/13/2015   Diabetic foot ulcer (HCC) 02/08/2021    Diabetic ulcer of left heel associated with type 2 diabetes mellitus (HCC) 02/08/2021   Eczema    Family history of adverse reaction to anesthesia    a.) PONV in mother and grandmother   Gas gangrene (HCC) 09/18/2022   GERD (gastroesophageal reflux disease) 05/10/2014   History of kidney stones    History of MSSA bacteremia due to osteomyelitis L foot 09/18/22 10/22/2022   HLD (hyperlipidemia)    Hyponatremia 03/13/2015   Migraines    Motion sickness    MSSA bacteremia 09/20/2022   Osteomyelitis (HCC) 09/18/2022   Other acute osteomyelitis, left ankle and foot (HCC) 02/13/2022   Panic attacks    Peripheral edema 04/27/2018   Pneumonia    PONV (postoperative nausea and vomiting)    Sepsis secondary to diabetic foot infection 03/13/2015   Sepsis secondary to diabetic foot infection 03/13/2015   T2DM (type 2 diabetes mellitus) (HCC)    Takotsubo cardiomyopathy / transient apical balooning syndrome / stress-induced cardiomyopathy 05/10/2014   Unspecified essential hypertension    Assessment: 58 year old female admitted to [facility] for shortness of breath and abdominal pain. Patient with past medical history significant for HTN, DM , left BKA, asthma, morbid obesity, NICM (EF 70% 12/08/2023), stage 3 ovarian CA on maintenance chemotherapy. . New orders to start IV heparin , 65mg  of lovenox  given last night. Hemoglobin low at 10 but stable overnight.   Goal of Therapy:  Heparin  level 0.3-0.7 units/ml Monitor platelets by anticoagulation protocol: Yes   Date Time Results Comments 12/18 1650 HL= 0.74 SUPRAtherapeutic, rate 1500 units/hr 12/19   0202   HL= 0.48         Therapeutic X 1   Plan:  12/19:  HL @ 0202 = 0.48, therapeutic X 1 - Will continue pt on current rate and recheck HL in 6 hrs.  Continue to monitor H&H and platelets  Lyanna Blystone D 12/26/2023 2:42 AM        [1]  Allergies Allergen Reactions   Shellfish Allergy Anaphylaxis   Codeine Other (See Comments)     Migraine  Other Reaction(s): Other (See Comments)  Reaction:  Severe migraines   Morphine  And Codeine Other (See Comments)    Reaction:  Severe migraines    Rosuvastatin     Other reaction(s): Muscle Pain   Metformin  And Related Diarrhea   Percocet [Oxycodone -Acetaminophen ] Nausea And Vomiting   Sulfa Antibiotics Rash   Theophyllines Itching and Rash

## 2023-12-26 NOTE — Progress Notes (Signed)
 " PROGRESS NOTE    Nancy [name]  FMW:[id number] DOB: [birth year] DOA: 12/24/2023 PCP: Orlean Alan HERO, FNP    Brief Narrative:  58 y.o. female with medical history significant for HTN, DM , left BKA, asthma, morbid obesity, NICM (EF 70% 12/08/2023), stage 3 ovarian CA on maintenance chemotherapy, currently on hold due to chemotherapy induced diarrhea, being admitted with with a large pericardial effusion. Patient was recently hospitalized at [facility] (11/29 - 12/09/2023) with an acute pericardial effusion attributed to malignancy/chemo after presenting with chest pain and dyspnea on exertion.   Serial echocardiograms (most recently 12/09/23)showed stable moderate, circumferential pericardial effusion.  She was discharged on colchicine .  Since discharge she has had ongoing chest now extending to upper abdomen as well as dyspnea on exertion.  At her oncology follow-up on 12/24/2023 her oncologist ordered CT chest which showed a large effusion and she was directed to the ED for further workup..   In the ED, hemodynamically stable with BP 148/87 and pulse 99, respiratory rate 18 with O2 sat 96% on room air.  BMP and CBC at baseline, troponin and proBNP within normal limits.  Hepatic function panel not done.  EKG showed sinus tachycardia at 201 with low voltage QRS Chest x-ray showed cardiomegaly. Outpatient CT chest abdomen pelvis done earlier in the day showed new large pericardial effusion as well as possible appendicitis The ED provider spoke with cardiologist, Dr. Florencio who recommended admission and close hemodynamic monitoring.  Will see in the a.m. unless patient decompensates during the night, will evaluate sooner ED provider also spoke with surgery, Dr. Hezekiah regarding appendicitis seen on CT and advised Zosyn  and cardiac clearance prior to surgical intervention Admission requested   Cardiology and general surgery consulted.  Surgery not indicated at this time.  Clinical presentation is  not consistent with acute appendicitis.  Patient main complaint is upper abdominal pain associated with fatigue and nausea.   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)   Diabetes mellitus, type 2 (HCC)   Obesity, Class III, BMI 40-49.9 (morbid obesity) (HCC)   Generalized anxiety disorder   Asthma without status asthmaticus   Neuropathy, diabetic (HCC)   Migraine without aura and without status migrainosus, not intractable   S/P left  BKA 09/24/22 secondary to osteomyelitis (below knee amputation) (HCC)   Chemotherapy induced diarrhea   Chronic obstructive pulmonary disease, unspecified COPD type (HCC)   Acute appendicitis   Acute pericardial effusion  Pericardial effusion, without tamponade Recent acute pericarditis 12/07/2023 at [facility](moderate effusion on echo 12/09/23) Hemodynamically stable Suspected secondary to no malignancy Pericardiocentesis performed 12/19 Nearly 1 L of blood-tinged fluid removed from pericardium Plan: Transfer to stepdown Continue colchicine  for now Telemetry monitoring Cardiology following Okay for diet Hold heparin  Follow fluid studies from pericardial effusion  New onset rapid atrial fibrillation Noted on telemetry and verified by twelve-lead EKG on 12/18 Plan: Amio gtt. per cardiology Heparin  GTT on hold Continue telemetry monitoring   NICM (nonischemic cardiomyopathy) (HCC) History of Takotsubo cardiomyopathy 2010 Last echo 12/09/2023 with EF 70%(previously 30% in 2010) Continue carvedilol , lisinopril  with close BP monitoring in view of pericarditis Troponin and proBNP WNL Getting LFTs--> recently elevated at [facility], now normal.   Essential hypertension Cautiously continuing antihypertensives   Primary high grade serous adenocarcinoma of ovary  (HCC) Chemotherapy induced diarrhea(olaparib ) S/p neoadjuvant chemoradiation, on maintenance  chemotherapy, currently held Recently saw oncology 12/22/2023 when chemo was held due to diarrhea   Diabetes mellitus, type 2 (HCC) Sliding scale insulin  coverage   Generalized anxiety disorder Continue Pristiq , mirtazapine  and alprazolam    Obesity, Class III, BMI 40-49.9 (morbid obesity) (HCC) Complicating factor to overall prognosis and care   Asthma without status asthmaticus Not acutely exacerbated Albuterol  nebulizer as needed   S/P left  BKA 09/24/22 secondary to osteomyelitis (below knee amputation) (HCC) No acute issues  Abdominal pain There was some clinical concern for appendicitis.  Patient has ovarian cancer.  Though the diagnosis of appendicitis is felt to be unlikely it is prudent to resume a 5-day course of antibiotics.  Unable to fully rule out appendicitis at this time.  No plans for surgical intervention.     DVT prophylaxis: SCD Code Status: Full Family Communication: Spouse at bedside 12/18, 12/19 Disposition Plan: Status is: Inpatient Remains inpatient appropriate because: Multiple acute issues as above    Level of care: Stepdown  Consultants:  General Surgery Cardiology  Procedures:  None  Antimicrobials: Zosyn    Subjective: Seen and examined.  No status changes overnight.  Vitals remained stable. Objective: Vitals:   12/26/23 1014 12/26/23 1019 12/26/23 1044 12/26/23 1100  BP: 134/68 (!) 143/55 127/82 (!) 149/68  Pulse: 75 77 81 81  Resp: 14 13 (!) 21 14  Temp:   98.8 F (37.1 C)   TempSrc:   Oral   SpO2: 100% 100% 100% 98%  Weight:      Height:        Intake/Output Summary (Last 24 hours) at 12/26/2023 1222 Last data filed at 12/26/2023 1100 Gross per 24 hour  Intake 1177.81 ml  Output --  Net 1177.81 ml   Filed Weights   12/24/23 1821  Weight: 130.6 kg    Examination:  General exam: Uncomfortable and fatigued Respiratory  system: Clear to auscultation. Respiratory effort normal. Cardiovascular system: Diminished heart sounds, S1-S2, no murmurs, no pedal edema Gastrointestinal system: Soft, NT/ND, normal bowel sounds Central nervous system: Alert and oriented. No focal neurological deficits. Extremities: Left lower extremity BKA Skin: No rashes, lesions or ulcers Psychiatry: Judgement and insight appear normal. Mood & affect appropriate.     Data Reviewed: I have personally reviewed following labs and imaging studies  CBC: Recent Labs  Lab 12/22/23 1308 12/24/23 1823 12/25/23 0523 12/26/23 0202  WBC 8.4 9.6 7.8 10.5  NEUTROABS 4.5  --   --   --   HGB 9.9* 10.0* 10.0* 10.4*  HCT 31.9* 32.0* 31.5* 33.7*  MCV 102.6* 102.6* 101.0* 102.7*  PLT 499* 492* 486* 454*   Basic Metabolic Panel: Recent Labs  Lab 12/22/23 1308 12/24/23 1823 12/25/23 0523 12/26/23 0202  NA 137 131* 135 133*  K 4.4 4.4 4.3 4.5  CL 102 96* 99 98  CO2 23 23 24 22   GLUCOSE 163* 188* 178* 199*  BUN 23* 27* 31* 36*  CREATININE 0.90 0.88 1.15* 1.28*  CALCIUM 9.4 8.8* 8.8* 8.9  MG 2.1  --   --  2.2   GFR: Estimated Creatinine Clearance: 68.7 mL/min (A) (by C-G formula based on SCr of 1.28 mg/dL (H)). Liver Function Tests: Recent Labs  Lab 12/22/23 1308 12/24/23 2128  AST 21 19  ALT 31 31  ALKPHOS 92 99  BILITOT 0.4 0.5  PROT 7.5 6.9  ALBUMIN 4.1 3.7   No results for input(s): LIPASE, AMYLASE in the last 168 hours. No results for input(s): AMMONIA in the last 168  hours. Coagulation Profile: No results for input(s): INR, PROTIME in the last 168 hours. Cardiac Enzymes: No results for input(s): CKTOTAL, CKMB, CKMBINDEX, TROPONINI in the last 168 hours. BNP (last 3 results) Recent Labs    12/24/23 1823  PROBNP 211.0   HbA1C: No results for input(s): HGBA1C in the last 72 hours. CBG: Recent Labs  Lab 12/25/23 1140 12/25/23 1627 12/25/23 2047 12/26/23 0735 12/26/23 1039  GLUCAP 212*  203* 186* 189* 173*   Lipid Profile: No results for input(s): CHOL, HDL, LDLCALC, TRIG, CHOLHDL, LDLDIRECT in the last 72 hours. Thyroid  Function Tests: No results for input(s): TSH, T4TOTAL, FREET4, T3FREE, THYROIDAB in the last 72 hours. Anemia Panel: No results for input(s): VITAMINB12, FOLATE, FERRITIN, TIBC, IRON , RETICCTPCT in the last 72 hours. Sepsis Labs: No results for input(s): PROCALCITON, LATICACIDVEN in the last 168 hours.  No results found for this or any previous visit (from the past 240 hours).       Radiology Studies: CARDIAC CATHETERIZATION Result Date: 12/26/2023 Successful pericardiocentesis via the subxiphoid area.  A total of 940 mL of bloody fluid was removed. Recommendations: Fluid was sent to the lab for analysis including cytology. Continue drainage to gravity and if output is less than 50 mL in 24 hours and echocardiogram shows resolution of pericardial effusion, the drain can be removed. I ordered her repeat limited echocardiogram to be done tomorrow. Recommend holding anticoagulation for at least 24 hours given bloody effusion.   ECHOCARDIOGRAM COMPLETE Result Date: 12/25/2023    ECHOCARDIOGRAM REPORT   Patient Name:   Nancy [name] Date of Exam: 12/25/2023 Medical Rec #:  [id number]           Height:       69.0 in Accession #:    [id number]          Weight:       288.0 lb Date of Birth:  11/20/1965          BSA:          2.412 m Patient Age:    58 years            BP:           127/90 mmHg Patient Gender: F                   HR:           86 bpm. Exam Location:  [facility] Procedure: 2D Echo, Cardiac Doppler, Color Doppler and Intracardiac            Opacification Agent (Both Spectral and Color Flow Doppler were            utilized during procedure). Indications:     Pericardial effusion I31.3  History:         Patient has prior history of Echocardiogram examinations, most                  recent 09/22/2022. Ovarian cancer,  Arrythmias:Atrial                  Fibrillation; Signs/Symptoms:Dyspnea.  Sonographer:     Ashley McNeely-Sloane Referring Phys:  [id number] CARALYN HUDSON Diagnosing Phys: Marsa Dooms MD IMPRESSIONS  1. Left ventricular ejection fraction, by estimation, is 55 to 60%. The left ventricle has normal function. The left ventricle has no regional wall motion abnormalities. Left ventricular diastolic parameters are consistent with Grade I diastolic dysfunction (impaired relaxation).  2. Right ventricular systolic function is normal. The right ventricular size  is normal.  3. Large pericardial effusion. The pericardial effusion is circumferential. There is no evidence of cardiac tamponade.  4. The mitral valve is normal in structure. Trivial mitral valve regurgitation. No evidence of mitral stenosis.  5. The aortic valve is normal in structure. Aortic valve regurgitation is not visualized. No aortic stenosis is present.  6. The inferior vena cava is normal in size with greater than 50% respiratory variability, suggesting right atrial pressure of 3 mmHg. FINDINGS  Left Ventricle: Left ventricular ejection fraction, by estimation, is 55 to 60%. The left ventricle has normal function. The left ventricle has no regional wall motion abnormalities. Definity  contrast agent was given IV to delineate the left ventricular  endocardial borders. Strain was performed and the global longitudinal strain is indeterminate. The left ventricular internal cavity size was normal in size. There is no left ventricular hypertrophy. Left ventricular diastolic parameters are consistent with Grade I diastolic dysfunction (impaired relaxation). Right Ventricle: The right ventricular size is normal. No increase in right ventricular wall thickness. Right ventricular systolic function is normal. Left Atrium: Left atrial size was normal in size. Right Atrium: Right atrial size was normal in size. Pericardium: A large pericardial effusion is present.  The pericardial effusion is circumferential. There is no evidence of cardiac tamponade. Mitral Valve: The mitral valve is normal in structure. Trivial mitral valve regurgitation. No evidence of mitral valve stenosis. MV peak gradient, 3.6 mmHg. The mean mitral valve gradient is 2.0 mmHg. Tricuspid Valve: The tricuspid valve is normal in structure. Tricuspid valve regurgitation is trivial. No evidence of tricuspid stenosis. Aortic Valve: The aortic valve is normal in structure. Aortic valve regurgitation is not visualized. No aortic stenosis is present. Aortic valve mean gradient measures 3.0 mmHg. Aortic valve peak gradient measures 4.9 mmHg. Aortic valve area, by VTI measures 3.23 cm. Pulmonic Valve: The pulmonic valve was normal in structure. Pulmonic valve regurgitation is not visualized. No evidence of pulmonic stenosis. Aorta: The aortic root is normal in size and structure. Venous: The inferior vena cava is normal in size with greater than 50% respiratory variability, suggesting right atrial pressure of 3 mmHg. IAS/Shunts: No atrial level shunt detected by color flow Doppler. Additional Comments: 3D was performed not requiring image post processing on an independent workstation and was indeterminate.  LEFT VENTRICLE PLAX 2D LVIDd:         2.90 cm     Diastology LVIDs:         2.10 cm     LV e' medial:    6.31 cm/s LV PW:         2.20 cm     LV E/e' medial:  14.8 LV IVS:        1.30 cm     LV e' lateral:   5.44 cm/s LVOT diam:     2.10 cm     LV E/e' lateral: 17.1 LV SV:         59 LV SV Index:   24 LVOT Area:     3.46 cm  LV Volumes (MOD) LV vol d, MOD A2C: 77.9 ml LV vol s, MOD A2C: 20.5 ml LV SV MOD A2C:     57.4 ml RIGHT VENTRICLE             IVC RV S prime:     18.50 cm/s  IVC diam: 2.30 cm TAPSE (M-mode): 2.1 cm LEFT ATRIUM           Index  RIGHT ATRIUM           Index LA Vol (A4C): 71.1 ml 29.47 ml/m  RA Area:     22.70 cm                                    RA Volume:   68.80 ml  28.52 ml/m   AORTIC VALVE                    PULMONIC VALVE AV Area (Vmax):    3.32 cm     PV Vmax:        0.89 m/s AV Area (Vmean):   3.20 cm     PV Vmean:       63.700 cm/s AV Area (VTI):     3.23 cm     PV VTI:         0.158 m AV Vmax:           110.50 cm/s  PV Peak grad:   3.2 mmHg AV Vmean:          75.650 cm/s  PV Mean grad:   2.0 mmHg AV VTI:            0.181 m      RVOT Peak grad: 2 mmHg AV Peak Grad:      4.9 mmHg AV Mean Grad:      3.0 mmHg LVOT Vmax:         106.00 cm/s LVOT Vmean:        69.800 cm/s LVOT VTI:          0.169 m LVOT/AV VTI ratio: 0.93  AORTA Ao Root diam: 2.90 cm Ao Asc diam:  3.80 cm MITRAL VALVE MV Area (PHT): 3.17 cm     SHUNTS MV Area VTI:   3.68 cm     Systemic VTI:  0.17 m MV Peak grad:  3.6 mmHg     Systemic Diam: 2.10 cm MV Mean grad:  2.0 mmHg     Pulmonic VTI:  0.119 m MV Vmax:       0.95 m/s MV Vmean:      58.9 cm/s MV Decel Time: 239 msec MV E velocity: 93.20 cm/s MV A velocity: 112.00 cm/s MV E/A ratio:  0.83 Marsa Dooms MD Electronically signed by Marsa Dooms MD Signature Date/Time: 12/25/2023/4:32:43 PM    Final    DG Chest 2 View Result Date: 12/24/2023 EXAM: 2 VIEW(S) XRAY OF THE CHEST 12/24/2023 06:38:21 PM COMPARISON: None available. CLINICAL HISTORY: shortness of breath FINDINGS: LINES, TUBES AND DEVICES: Right IJ approach Port-A-Cath in place with tip projecting at the superior right atrium. LUNGS AND PLEURA: No focal pulmonary opacity. No pleural effusion. No pneumothorax. HEART AND MEDIASTINUM: Cardiomegaly. Aortic atherosclerosis. BONES AND SOFT TISSUES: No acute osseous abnormality. IMPRESSION: 1. Right IJ approach Port-A-Cath with tip projecting at the superior right atrium. 2. Cardiomegaly. 3. Aortic atherosclerosis. Electronically signed by: Greig Pique MD 12/24/2023 06:51 PM EST RP Workstation: [id number]   CT CHEST ABDOMEN PELVIS W CONTRAST Result Date: 12/24/2023 EXAM: CT CHEST, ABDOMEN AND PELVIS WITH CONTRAST 12/24/2023 06:14:55 PM  TECHNIQUE: CT of the chest, abdomen and pelvis was performed with the administration of 100 mL of iohexol  (OMNIPAQUE ) 300 MG/ML solution. Multiplanar reformatted images are provided for review. Automated exposure control, iterative reconstruction, and/or weight based adjustment of the mA/kV was utilized to reduce the radiation dose to as low  as reasonably achievable. COMPARISON: CT chest abdomen and pelvis 10/22/2023. CLINICAL HISTORY: ovarian cancer ovarian cancer FINDINGS: CHEST: MEDIASTINUM AND LYMPH NODES: There is a new large pericardial effusion. Right chest port catheter tip ends in the distal SVC. The central airways are clear. No mediastinal, hilar or axillary lymphadenopathy. LUNGS AND PLEURA: There is a new small right pleural effusion. There is a calcified granuloma in the right upper lobe. No focal consolidation or pulmonary edema. No pneumothorax. ABDOMEN AND PELVIS: LIVER: The liver is unremarkable. GALLBLADDER AND BILE DUCTS: The gallbladder is surgically absent. No biliary ductal dilatation. SPLEEN: No acute abnormality. PANCREAS: No acute abnormality. ADRENAL GLANDS: Low attenuation left adrenal adenoma or myelolipoma measures 3 cm; this is better seen on today's study when compared to the prior examination. The right adrenal gland appears normal. KIDNEYS, URETERS AND BLADDER: There is a small fat containing lesion in the left kidney posteriorly measuring 9 mm, likely angiomyolipoma. Subcentimeter hypodensity may represent a cyst in the inferior pole of the left kidney. There is a 5 mm fat containing lesion in the inferior pole of the right kidney which is unchanged, likely angiomyolipoma. No stones in the kidneys or ureters. No hydronephrosis. No perinephric or periureteral stranding. Urinary bladder is unremarkable. GI AND BOWEL: The appendix is prominent in size measuring up to 8 mm. There is some trace surrounding inflammatory stranding. There is no fluid collection or perforation. Stomach  demonstrates no acute abnormality. There is no bowel obstruction. REPRODUCTIVE ORGANS: The uterus is surgically absent. The ovaries are not definitively seen. PERITONEUM AND RETROPERITONEUM: There is trace free fluid in the right upper quadrant which is new from prior. No free air. VASCULATURE: Aorta is normal in caliber. There are atherosclerotic calcifications of the aorta. ABDOMINAL AND PELVIS LYMPH NODES: There are scattered nonenlarged retroperitoneal lymph nodes which are similar to the prior study. BONES AND SOFT TISSUES: No acute osseous abnormality. No focal soft tissue abnormality. IMPRESSION: 1. New large pericardial effusion. 2. New small right pleural effusion. 3. Prominent appendix with trace surrounding inflammatory stranding, without fluid collection or perforation. Please correlate clinically for early acute appendicitis. 4. Low attenuation 3 cm left adrenal lesion, favored adenoma or myelolipoma. 5. No evidence of metastatic disease. Electronically signed by: Greig Pique MD 12/24/2023 06:42 PM EST RP Workstation: [id number]        Scheduled Meds:  [START ON 12/28/2023] apixaban   5 mg Oral BID   Chlorhexidine  Gluconate Cloth  6 each Topical Daily   colchicine   0.6 mg Oral BID   [START ON 12/27/2023] famotidine   20 mg Oral BID   insulin  aspart  0-15 Units Subcutaneous TID WC   insulin  aspart  0-5 Units Subcutaneous QHS   [START ON 12/27/2023] pantoprazole   40 mg Oral Daily   pantoprazole  (PROTONIX ) IV  40 mg Intravenous Daily   venlafaxine  XR  150 mg Oral Q breakfast   Continuous Infusions:  amiodarone  30 mg/hr (12/26/23 1100)   famotidine  (PEPCID ) IV Stopped (12/25/23 1223)   piperacillin -tazobactam (ZOSYN )  IV 3.375 g (12/26/23 0547)   promethazine  (PHENERGAN ) injection (IM or IVPB) Stopped (12/25/23 1415)     LOS: 1 day      Nancy KATHEE Robson, MD [facility]   If 7PM-7AM, please contact night-coverage  12/26/2023, 12:22 PM    "

## 2023-12-26 NOTE — Plan of Care (Signed)
" °  Problem: Education: Goal: Ability to describe self-care measures that may prevent or decrease complications (Diabetes Survival Skills Education) will improve Outcome: Progressing   Problem: Coping: Goal: Ability to adjust to condition or change in health will improve Outcome: Progressing   Problem: Health Behavior/Discharge Planning: Goal: Ability to manage health-related needs will improve Outcome: Progressing   Problem: Metabolic: Goal: Ability to maintain appropriate glucose levels will improve Outcome: Progressing   Problem: Nutritional: Goal: Maintenance of adequate nutrition will improve Outcome: Progressing   Problem: Education: Goal: Knowledge of General Education information will improve Description: Including pain rating scale, medication(s)/side effects and non-pharmacologic comfort measures Outcome: Progressing   Problem: Clinical Measurements: Goal: Ability to maintain clinical measurements within normal limits will improve Outcome: Progressing Goal: Respiratory complications will improve Outcome: Progressing Goal: Cardiovascular complication will be avoided Outcome: Progressing   "

## 2023-12-26 NOTE — Progress Notes (Cosign Needed Addendum)
 " [facility] CLINIC CARDIOLOGY PROGRESS NOTE       Patient ID: Nancy [name] MRN: [id number] DOB/AGE: October 11, 1965 58 y.o.  Admit date: 12/24/2023 Referring Physician Dr. Delayne Solian Primary Physician Orlean Alan HERO, FNP  Primary Cardiologist none Reason for Consultation pericardial effusion  HPI: Nancy [name] is a 58 y.o. female  with a past medical history of HTN, DM , left BKA, asthma, morbid obesity, NICM (EF 70% 12/08/2023), stage 3 ovarian CA on maintenance chemotherapy, currently on hold due to chemotherapy induced diarrhea who presented to the ED on 12/24/2023 for abnormal CT results which showed large pericardial effusion. Cardiology was consulted for further evaluation.   Interval history: - Patient seen and examined this morning, sitting upright on side of hospital bed with wife at bedside. - She states that she feels about the same today with stable abdominal discomfort.  No complaints of shortness of breath, lightheadedness, dizziness. - BP remains stable and heart rate controlled with IV amiodarone  and normal sinus rhythm. - Plan for pericardiocentesis this morning, patient and wife are aware of plan and amenable to proceeding.  Review of systems complete and found to be negative unless listed above    Past Medical History:  Diagnosis Date   Abnormal cardiovascular stress test 09/21/2018   Formatting of this note might be different from the original. Lexiscan Myoview 09/16/2018 revealed mild anterior ischemia   Adverse effect of motion 05/10/2014   AKI (acute kidney injury) 10/22/2022   Anxiety    Arthritis    r knee   Asthma    Breast cyst 05/10/2014   CAD (coronary artery disease)    a.) LHC 06/04/2009 at [facility]; non-obstructive CAD. b.) CTA with FFR 10/08/2018: extensive mixed plaque proximal to mid LAD (51-69%); Coronary Ca score 217; FFR 0.71 dPDA, 0.86 mLAD, 0.87 dLCx.   CCF (congestive cardiac failure) (HCC) 06/06/2008   a.) 30% EF. b.) TTE  06/03/2011: EF >55%; triv MR. c.) TTE 04/27/2018: EF 55%; mild LVH; triv PR, mild MR/TR; G1DD.   Cellulitis of left lower extremity 09/18/2022   Chest pain with high risk for cardiac etiology 07/02/2016   Chronic use of opiate drug for therapeutic purpose 02/13/2022   Formatting of this note might be different from the original. [facility] Pain Contract signed on 04/18/16 & updated 03/04/17; UDS done on 04/18/16.   Complication of anesthesia    Diabetic foot infection (HCC) 03/13/2015   Diabetic foot ulcer (HCC) 02/08/2021   Diabetic ulcer of left heel associated with type 2 diabetes mellitus (HCC) 02/08/2021   Eczema    Family history of adverse reaction to anesthesia    a.) PONV in mother and grandmother   Gas gangrene (HCC) 09/18/2022   GERD (gastroesophageal reflux disease) 05/10/2014   History of kidney stones    History of MSSA bacteremia due to osteomyelitis L foot 09/18/22 10/22/2022   HLD (hyperlipidemia)    Hyponatremia 03/13/2015   Migraines    Motion sickness    MSSA bacteremia 09/20/2022   Osteomyelitis (HCC) 09/18/2022   Other acute osteomyelitis, left ankle and foot (HCC) 02/13/2022   Panic attacks    Peripheral edema 04/27/2018   Pneumonia    PONV (postoperative nausea and vomiting)    Sepsis secondary to diabetic foot infection 03/13/2015   Sepsis secondary to diabetic foot infection 03/13/2015   T2DM (type 2 diabetes mellitus) (HCC)    Takotsubo cardiomyopathy / transient apical balooning syndrome / stress-induced cardiomyopathy 05/10/2014   Unspecified essential hypertension  Past Surgical History:  Procedure Laterality Date   AMPUTATION Left 09/19/2022   Procedure: AMPUTATION BELOW KNEE;  Surgeon: Jama Cordella MATSU, MD;  Location: [facility] ORS;  Service: Vascular;  Laterality: Left;   AMPUTATION Left 09/24/2022   Procedure: AMPUTATION BELOW KNEE WITH WOUND CLOSURE;  Surgeon: Jama Cordella MATSU, MD;  Location: [facility];  Service: Vascular;  Laterality: Left;   AMPUTATION  TOE Left 03/16/2015   Procedure: left fifth toe amputation with incision and drainage;  Surgeon: Eva Gay, DPM;  Location: [facility] OR[facility];  Service: Podiatry;  Laterality: Left;   APPLICATION OF WOUND VAC Left 02/14/2021   Procedure: APPLICATION OF WOUND VAC;  Surgeon: Gay Eva, DPM;  Location: [facility];  Service: Podiatry;  Laterality: Left;   APPLICATION OF WOUND VAC Left 04/16/2021   Procedure: APPLICATION OF WOUND VAC;  Surgeon: Lowery Estefana RAMAN, DO;  Location: [facility];  Service: Plastics;  Laterality: Left;   CARDIAC CATHETERIZATION Left 06/04/2009   Procedure: CARDIAC CATHETERIZATION; Location: [facility]   CHOLECYSTECTOMY     DEBRIDEMENT AND CLOSURE WOUND Left 04/16/2021   Procedure: DEBRIDEMENT AND CLOSURE WOUND;  Surgeon: Lowery Estefana RAMAN, DO;  Location: [facility];  Service: Plastics;  Laterality: Left;  1 hour   IR IMAGING GUIDED PORT INSERTION  11/18/2022   IRRIGATION AND DEBRIDEMENT FOOT Left 02/08/2021   Procedure: IRRIGATION AND DEBRIDEMENT FOOT - LFT HEEL ULCER;  Surgeon: Gay Eva, DPM;  Location: [facility];  Service: Podiatry;  Laterality: Left;   IRRIGATION AND DEBRIDEMENT FOOT Left 02/14/2021   Procedure: IRRIGATION AND DEBRIDEMENT LEFT HEEL;  Surgeon: Gay Eva, DPM;  Location: [facility];  Service: Podiatry;  Laterality: Left;   kidney stone removal     KNEE ARTHROSCOPY W/ MENISCAL REPAIR     NOSE SURGERY  01/07/1989   due to fracture   TEE WITHOUT CARDIOVERSION N/A 09/26/2022   Procedure: TRANSESOPHAGEAL ECHOCARDIOGRAM;  Surgeon: Dewane Shiner, DO;  Location: [facility];  Service: Cardiovascular;  Laterality: N/A;   TOE AMPUTATION     second toe   TONSILLECTOMY     TOTAL KNEE ARTHROPLASTY  01/11/2011   Procedure: TOTAL KNEE ARTHROPLASTY;  Surgeon: Norleen LITTIE Gavel;  Location: [facility];  Service: Orthopedics;  Laterality: Right;  COMPUTER ASSISTED TOTAL KNEE REPLACEMENT    (Not in a hospital admission)  Social  History   Socioeconomic History   Marital status: Married    Spouse name: Not on file   Number of children: Not on file   Years of education: Not on file   Highest education level: Not on file  Occupational History   Not on file  Tobacco Use   Smoking status: Former    Current packs/day: 0.00    Types: Cigarettes    Quit date: 03/12/1985    Years since quitting: 38.8   Smokeless tobacco: Never  Vaping Use   Vaping status: Never Used  Substance and Sexual Activity   Alcohol use: No   Drug use: No   Sexual activity: Yes    Birth control/protection: Post-menopausal  Other Topics Concern   Not on file  Social History Narrative   Wife, Barnie, at bedside. One indoor pet, dog.   Social Drivers of Health   Tobacco Use: Medium Risk (12/24/2023)   Patient History    Smoking Tobacco Use: Former    Smokeless Tobacco Use: Never    Passive Exposure: Not on file  Financial Resource Strain: Medium Risk (12/01/2023)   Received from [facility]   Overall Financial Resource Strain (CARDIA)    Difficulty of Paying Living Expenses: Somewhat hard  Food Insecurity: No Food Insecurity (12/24/2023)   Epic    Worried About Programme Researcher, Broadcasting/film/video in the Last Year: Never true    Ran Out of Food in the Last Year: Never true  Transportation Needs: No Transportation Needs (12/24/2023)   Epic    Lack of Transportation (Medical): No    Lack of Transportation (Non-Medical): No  Recent Concern: Transportation Needs - Unmet Transportation Needs (12/01/2023)   Received from [facility] - Transportation    In the past 12 months, has lack of transportation kept you from medical appointments or from getting medications?: Yes    Lack of Transportation (Non-Medical): Yes  Physical Activity: Not on file  Stress: Not on file  Social Connections: Not on file  Intimate Partner Violence: Not At Risk (10/22/2022)   Humiliation, Afraid, Rape, and Kick questionnaire     Fear of Current or Ex-Partner: No    Emotionally Abused: No    Physically Abused: No    Sexually Abused: No  Depression (PHQ2-9): Low Risk (11/04/2023)   Depression (PHQ2-9)    PHQ-2 Score: 1  Alcohol Screen: Low Risk (12/24/2023)   Alcohol Screen    Last Alcohol Screening Score (AUDIT): 0  Housing: Low Risk  (12/01/2023)   Received from [facility]   Epic    In the last 12 months, was there a time when you were not able to pay the mortgage or rent on time?: No    In the past 12 months, how many times have you moved where you were living?: 0    At any time in the past 12 months, were you homeless or living in a shelter (including now)?: No  Utilities: Not At Risk (12/01/2023)   Received from [facility]   Epic    In the past 12 months has the electric, gas, oil, or water  company threatened to shut off services in your home?: No  Health Literacy: Not on file    Family History  Problem Relation Age of Onset   Transient ischemic attack Mother    Asthma Mother    Hypertension Mother    Lung cancer Father 1   Hypertension Father    Asthma Sister    Anxiety disorder Sister    Asthma Sister    Hypertension Brother    Ovarian cancer Maternal Aunt 56   Melanoma Maternal Uncle        dx 45s   Lung cancer Paternal Aunt 103   Stomach cancer Maternal Grandmother    Colon cancer Maternal Grandmother    Lymphoma Maternal Grandmother    Melanoma Maternal Grandfather 72       sun exposure   Lung cancer Paternal Grandmother 70   Kidney cancer Paternal Grandfather        dx early   Crohn's disease Other    GER disease Other    Breast cancer Neg Hx      Vitals:   12/25/23 0802 12/25/23 1222 12/25/23 1630 12/26/23 0000  BP:  (!) 127/90 134/89 (!) 145/90  Pulse:  83 84   Resp:  11 16   Temp: 97.6 F (36.4 C) 98.2 F (36.8 C) 97.7 F (36.5 C)   TempSrc: Oral  Oral   SpO2:  95% 100%   Weight:      Height:  PHYSICAL EXAM General:  Ill-appearing female, well nourished, in no acute distress. HEENT: Normocephalic and atraumatic. Neck: No JVD.  Lungs: Normal respiratory effort on room air. Clear bilaterally to auscultation. No wheezes, crackles, rhonchi.  Heart: Irregularly irregular, elevated rate. Normal S1 and S2 without gallops or murmurs.  Abdomen: Non-distended appearing.  Msk: Normal strength and tone for age. Extremities: Warm and well perfused. No clubbing, cyanosis.  Trace edema.  Neuro: Alert and oriented X 3. Psych: Answers questions appropriately.   Labs: Basic Metabolic Panel: Recent Labs    12/25/23 0523 12/26/23 0202  NA 135 133*  K 4.3 4.5  CL 99 98  CO2 24 22  GLUCOSE 178* 199*  BUN 31* 36*  CREATININE 1.15* 1.28*  CALCIUM 8.8* 8.9  MG  --  2.2   Liver Function Tests: Recent Labs    12/24/23 2128  AST 19  ALT 31  ALKPHOS 99  BILITOT 0.5  PROT 6.9  ALBUMIN 3.7   No results for input(s): LIPASE, AMYLASE in the last 72 hours. CBC: Recent Labs    12/25/23 0523 12/26/23 0202  WBC 7.8 10.5  HGB 10.0* 10.4*  HCT 31.5* 33.7*  MCV 101.0* 102.7*  PLT 486* 454*   Cardiac Enzymes: No results for input(s): CKTOTAL, CKMB, CKMBINDEX, TROPONINIHS in the last 72 hours. BNP: No results for input(s): BNP in the last 72 hours. D-Dimer: No results for input(s): DDIMER in the last 72 hours. Hemoglobin A1C: No results for input(s): HGBA1C in the last 72 hours. Fasting Lipid Panel: No results for input(s): CHOL, HDL, LDLCALC, TRIG, CHOLHDL, LDLDIRECT in the last 72 hours. Thyroid  Function Tests: No results for input(s): TSH, T4TOTAL, T3FREE, THYROIDAB in the last 72 hours.  Invalid input(s): FREET3 Anemia Panel: No results for input(s): VITAMINB12, FOLATE, FERRITIN, TIBC, IRON , RETICCTPCT in the last 72 hours.   Radiology: ECHOCARDIOGRAM COMPLETE Result Date: 12/25/2023    ECHOCARDIOGRAM REPORT   Patient Name:   Nancy [name]  Date of Exam: 12/25/2023 Medical Rec #:  [id number]           Height:       69.0 in Accession #:    [id number]          Weight:       288.0 lb Date of Birth:  [birth year]          BSA:          2.412 m Patient Age:    58 years            BP:           127/90 mmHg Patient Gender: F                   HR:           86 bpm. Exam Location:  [facility] Procedure: 2D Echo, Cardiac Doppler, Color Doppler and Intracardiac            Opacification Agent (Both Spectral and Color Flow Doppler were            utilized during procedure). Indications:     Pericardial effusion I31.3  History:         Patient has prior history of Echocardiogram examinations, most                  recent 09/22/2022. Ovarian cancer, Arrythmias:Atrial                  Fibrillation; Signs/Symptoms:Dyspnea.  Sonographer:  Ashley McNeely-Sloane Referring Phys:  [id number] Kyrell Ruacho Diagnosing Phys: Marsa Dooms MD IMPRESSIONS  1. Left ventricular ejection fraction, by estimation, is 55 to 60%. The left ventricle has normal function. The left ventricle has no regional wall motion abnormalities. Left ventricular diastolic parameters are consistent with Grade I diastolic dysfunction (impaired relaxation).  2. Right ventricular systolic function is normal. The right ventricular size is normal.  3. Large pericardial effusion. The pericardial effusion is circumferential. There is no evidence of cardiac tamponade.  4. The mitral valve is normal in structure. Trivial mitral valve regurgitation. No evidence of mitral stenosis.  5. The aortic valve is normal in structure. Aortic valve regurgitation is not visualized. No aortic stenosis is present.  6. The inferior vena cava is normal in size with greater than 50% respiratory variability, suggesting right atrial pressure of 3 mmHg. FINDINGS  Left Ventricle: Left ventricular ejection fraction, by estimation, is 55 to 60%. The left ventricle has normal function. The left ventricle has no regional wall motion  abnormalities. Definity  contrast agent was given IV to delineate the left ventricular  endocardial borders. Strain was performed and the global longitudinal strain is indeterminate. The left ventricular internal cavity size was normal in size. There is no left ventricular hypertrophy. Left ventricular diastolic parameters are consistent with Grade I diastolic dysfunction (impaired relaxation). Right Ventricle: The right ventricular size is normal. No increase in right ventricular wall thickness. Right ventricular systolic function is normal. Left Atrium: Left atrial size was normal in size. Right Atrium: Right atrial size was normal in size. Pericardium: A large pericardial effusion is present. The pericardial effusion is circumferential. There is no evidence of cardiac tamponade. Mitral Valve: The mitral valve is normal in structure. Trivial mitral valve regurgitation. No evidence of mitral valve stenosis. MV peak gradient, 3.6 mmHg. The mean mitral valve gradient is 2.0 mmHg. Tricuspid Valve: The tricuspid valve is normal in structure. Tricuspid valve regurgitation is trivial. No evidence of tricuspid stenosis. Aortic Valve: The aortic valve is normal in structure. Aortic valve regurgitation is not visualized. No aortic stenosis is present. Aortic valve mean gradient measures 3.0 mmHg. Aortic valve peak gradient measures 4.9 mmHg. Aortic valve area, by VTI measures 3.23 cm. Pulmonic Valve: The pulmonic valve was normal in structure. Pulmonic valve regurgitation is not visualized. No evidence of pulmonic stenosis. Aorta: The aortic root is normal in size and structure. Venous: The inferior vena cava is normal in size with greater than 50% respiratory variability, suggesting right atrial pressure of 3 mmHg. IAS/Shunts: No atrial level shunt detected by color flow Doppler. Additional Comments: 3D was performed not requiring image post processing on an independent workstation and was indeterminate.  LEFT VENTRICLE  PLAX 2D LVIDd:         2.90 cm     Diastology LVIDs:         2.10 cm     LV e' medial:    6.31 cm/s LV PW:         2.20 cm     LV E/e' medial:  14.8 LV IVS:        1.30 cm     LV e' lateral:   5.44 cm/s LVOT diam:     2.10 cm     LV E/e' lateral: 17.1 LV SV:         59 LV SV Index:   24 LVOT Area:     3.46 cm  LV Volumes (MOD) LV vol d, MOD A2C: 77.9 ml LV  vol s, MOD A2C: 20.5 ml LV SV MOD A2C:     57.4 ml RIGHT VENTRICLE             IVC RV S prime:     18.50 cm/s  IVC diam: 2.30 cm TAPSE (M-mode): 2.1 cm LEFT ATRIUM           Index        RIGHT ATRIUM           Index LA Vol (A4C): 71.1 ml 29.47 ml/m  RA Area:     22.70 cm                                    RA Volume:   68.80 ml  28.52 ml/m  AORTIC VALVE                    PULMONIC VALVE AV Area (Vmax):    3.32 cm     PV Vmax:        0.89 m/s AV Area (Vmean):   3.20 cm     PV Vmean:       63.700 cm/s AV Area (VTI):     3.23 cm     PV VTI:         0.158 m AV Vmax:           110.50 cm/s  PV Peak grad:   3.2 mmHg AV Vmean:          75.650 cm/s  PV Mean grad:   2.0 mmHg AV VTI:            0.181 m      RVOT Peak grad: 2 mmHg AV Peak Grad:      4.9 mmHg AV Mean Grad:      3.0 mmHg LVOT Vmax:         106.00 cm/s LVOT Vmean:        69.800 cm/s LVOT VTI:          0.169 m LVOT/AV VTI ratio: 0.93  AORTA Ao Root diam: 2.90 cm Ao Asc diam:  3.80 cm MITRAL VALVE MV Area (PHT): 3.17 cm     SHUNTS MV Area VTI:   3.68 cm     Systemic VTI:  0.17 m MV Peak grad:  3.6 mmHg     Systemic Diam: 2.10 cm MV Mean grad:  2.0 mmHg     Pulmonic VTI:  0.119 m MV Vmax:       0.95 m/s MV Vmean:      58.9 cm/s MV Decel Time: 239 msec MV E velocity: 93.20 cm/s MV A velocity: 112.00 cm/s MV E/A ratio:  0.83 Marsa Dooms MD Electronically signed by Marsa Dooms MD Signature Date/Time: 12/25/2023/4:32:43 PM    Final    DG Chest 2 View Result Date: 12/24/2023 EXAM: 2 VIEW(S) XRAY OF THE CHEST 12/24/2023 06:38:21 PM COMPARISON: None available. CLINICAL HISTORY: shortness of  breath FINDINGS: LINES, TUBES AND DEVICES: Right IJ approach Port-A-Cath in place with tip projecting at the superior right atrium. LUNGS AND PLEURA: No focal pulmonary opacity. No pleural effusion. No pneumothorax. HEART AND MEDIASTINUM: Cardiomegaly. Aortic atherosclerosis. BONES AND SOFT TISSUES: No acute osseous abnormality. IMPRESSION: 1. Right IJ approach Port-A-Cath with tip projecting at the superior right atrium. 2. Cardiomegaly. 3. Aortic atherosclerosis. Electronically signed by: Greig Pique MD 12/24/2023 06:51 PM EST RP Workstation: [id number]   CT CHEST ABDOMEN  PELVIS W CONTRAST Result Date: 12/24/2023 EXAM: CT CHEST, ABDOMEN AND PELVIS WITH CONTRAST 12/24/2023 06:14:55 PM TECHNIQUE: CT of the chest, abdomen and pelvis was performed with the administration of 100 mL of iohexol  (OMNIPAQUE ) 300 MG/ML solution. Multiplanar reformatted images are provided for review. Automated exposure control, iterative reconstruction, and/or weight based adjustment of the mA/kV was utilized to reduce the radiation dose to as low as reasonably achievable. COMPARISON: CT chest abdomen and pelvis 10/22/2023. CLINICAL HISTORY: ovarian cancer ovarian cancer FINDINGS: CHEST: MEDIASTINUM AND LYMPH NODES: There is a new large pericardial effusion. Right chest port catheter tip ends in the distal SVC. The central airways are clear. No mediastinal, hilar or axillary lymphadenopathy. LUNGS AND PLEURA: There is a new small right pleural effusion. There is a calcified granuloma in the right upper lobe. No focal consolidation or pulmonary edema. No pneumothorax. ABDOMEN AND PELVIS: LIVER: The liver is unremarkable. GALLBLADDER AND BILE DUCTS: The gallbladder is surgically absent. No biliary ductal dilatation. SPLEEN: No acute abnormality. PANCREAS: No acute abnormality. ADRENAL GLANDS: Low attenuation left adrenal adenoma or myelolipoma measures 3 cm; this is better seen on today's study when compared to the prior examination. The  right adrenal gland appears normal. KIDNEYS, URETERS AND BLADDER: There is a small fat containing lesion in the left kidney posteriorly measuring 9 mm, likely angiomyolipoma. Subcentimeter hypodensity may represent a cyst in the inferior pole of the left kidney. There is a 5 mm fat containing lesion in the inferior pole of the right kidney which is unchanged, likely angiomyolipoma. No stones in the kidneys or ureters. No hydronephrosis. No perinephric or periureteral stranding. Urinary bladder is unremarkable. GI AND BOWEL: The appendix is prominent in size measuring up to 8 mm. There is some trace surrounding inflammatory stranding. There is no fluid collection or perforation. Stomach demonstrates no acute abnormality. There is no bowel obstruction. REPRODUCTIVE ORGANS: The uterus is surgically absent. The ovaries are not definitively seen. PERITONEUM AND RETROPERITONEUM: There is trace free fluid in the right upper quadrant which is new from prior. No free air. VASCULATURE: Aorta is normal in caliber. There are atherosclerotic calcifications of the aorta. ABDOMINAL AND PELVIS LYMPH NODES: There are scattered nonenlarged retroperitoneal lymph nodes which are similar to the prior study. BONES AND SOFT TISSUES: No acute osseous abnormality. No focal soft tissue abnormality. IMPRESSION: 1. New large pericardial effusion. 2. New small right pleural effusion. 3. Prominent appendix with trace surrounding inflammatory stranding, without fluid collection or perforation. Please correlate clinically for early acute appendicitis. 4. Low attenuation 3 cm left adrenal lesion, favored adenoma or myelolipoma. 5. No evidence of metastatic disease. Electronically signed by: Greig Pique MD 12/24/2023 06:42 PM EST RP Workstation: [id number]   DG PAIN CLINIC C-ARM 1-60 MIN NO REPORT Result Date: 11/26/2023 Fluoro was used, but no Radiologist interpretation will be provided. Please refer to NOTES tab for provider progress  note.   ECHO as above  TELEMETRY (personally reviewed): Normal sinus rhythm rate 80s  EKG (personally reviewed): sinus tach rate 101 bpm  Data reviewed by me 12/26/2023: last 24h vitals tele labs imaging I/O ED provider note, admission H&P, hospitalist progress note  Principal Problem:   Pericardial effusion, without tamponade Active Problems:   Essential hypertension   Asthma without status asthmaticus   Neuropathy, diabetic (HCC)   Migraine without aura and without status migrainosus, not intractable   Diabetes mellitus, type 2 (HCC)   S/P left  BKA 09/24/22 secondary to osteomyelitis (below knee amputation) (HCC)   Obesity,  Class III, BMI 40-49.9 (morbid obesity) (HCC)   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 female  with a past medical history of HTN, DM , left BKA, asthma, morbid obesity, NICM (EF 70% 12/08/2023), stage 3 ovarian CA on maintenance chemotherapy, currently on hold due to chemotherapy induced diarrhea who presented to the ED on 12/24/2023 for abnormal CT results which showed large pericardial effusion. Cardiology was consulted for further evaluation.   # Atrial fibrillation RVR # New onset atrial fibrillation # Large pericardial effusion # Ovarian cancer Patient was sent to the ED for evaluation of pericardial effusion which was noted on CT chest yesterday to be large.  This was originally diagnosed during hospitalization last month at [facility], echo at that time reportedly showed moderate pericardial effusion.  She is in atrial fibrillation RVR on telemetry 12/25/2023 and converted to normal sinus rhythm later that day.  Echo 12/25/2023 with large pericardial effusion, EF  55-60% with no evidence of diastolic collapse or tamponade.-Plan for pericardiocentesis this a.m. given large pericardial effusion, patient has remained hemodynamically stable.  Will plan to send fluid for cytology given her cancer diagnosis. - Heparin  held this morning and preparation for pericardiocentesis.  Will likely plan to start Eliquis  over the weekend. - Continue IV amiodarone , will likely transition to p.o. later today versus tomorrow. - Further management per primary team.  ADDENDUM: S/p pericardiocentesis with 940 cc of bloody fluid removed. Pericardial drain in place - if minimal output then ok to remove tomorrow. Anticoagulation will be held until Sunday - Eliquis  5 mg BID ordered to start Sunday morning. Recommend transitioning to PO amiodarone  to tomorrow.   This patient's plan of care was discussed and created with Dr. Florencio and he is in agreement.  Signed: Danita Bloch, PA-C  12/26/2023, 8:03 AM [facility]      "

## 2023-12-27 ENCOUNTER — Inpatient Hospital Stay

## 2023-12-27 DIAGNOSIS — I4891 Unspecified atrial fibrillation: Secondary | ICD-10-CM | POA: Diagnosis not present

## 2023-12-27 DIAGNOSIS — I3139 Other pericardial effusion (noninflammatory): Secondary | ICD-10-CM | POA: Diagnosis not present

## 2023-12-27 LAB — BASIC METABOLIC PANEL WITH GFR
Anion gap: 7 (ref 5–15)
BUN: 27 mg/dL — ABNORMAL HIGH (ref 6–20)
CO2: 28 mmol/L (ref 22–32)
Calcium: 8.3 mg/dL — ABNORMAL LOW (ref 8.9–10.3)
Chloride: 102 mmol/L (ref 98–111)
Creatinine, Ser: 1.02 mg/dL — ABNORMAL HIGH (ref 0.44–1.00)
GFR, Estimated: >60 mL/min
Glucose, Bld: 188 mg/dL — ABNORMAL HIGH (ref 70–99)
Potassium: 3.8 mmol/L (ref 3.5–5.1)
Sodium: 137 mmol/L (ref 135–145)

## 2023-12-27 LAB — GLUCOSE, CAPILLARY
Glucose-Capillary: 213 mg/dL — ABNORMAL HIGH (ref 70–99)
Glucose-Capillary: 165 mg/dL — ABNORMAL HIGH (ref 70–99)
Glucose-Capillary: 140 mg/dL — ABNORMAL HIGH (ref 70–99)
Glucose-Capillary: 194 mg/dL — ABNORMAL HIGH (ref 70–99)

## 2023-12-27 LAB — CBC
HCT: 31.9 % — ABNORMAL LOW (ref 36.0–46.0)
Hemoglobin: 10 g/dL — ABNORMAL LOW (ref 12.0–15.0)
MCH: 31.5 pg (ref 26.0–34.0)
MCHC: 31.3 g/dL (ref 30.0–36.0)
MCV: 100.6 fL — ABNORMAL HIGH (ref 80.0–100.0)
Platelets: 378 K/uL (ref 150–400)
RBC: 3.17 MIL/uL — ABNORMAL LOW (ref 3.87–5.11)
RDW: 16.1 % — ABNORMAL HIGH (ref 11.5–15.5)
WBC: 10 K/uL (ref 4.0–10.5)
nRBC: 0 % (ref 0.0–0.2)

## 2023-12-27 MED ORDER — POLYETHYLENE GLYCOL 3350 17 G PO PACK: 17.0000 g | PACK | Freq: Every day | ORAL | Status: DC | PRN

## 2023-12-27 MED ORDER — SIMETHICONE 80 MG PO CHEW
80.0000 mg | CHEWABLE_TABLET | Freq: Four times a day (QID) | ORAL | Status: DC
  Administered 2023-12-27 – 2023-12-30 (×9): 80 mg via ORAL
  Filled 2023-12-27 (×13): qty 1

## 2023-12-27 MED ORDER — SENNOSIDES-DOCUSATE SODIUM 8.6-50 MG PO TABS
1.0000 | ORAL_TABLET | Freq: Two times a day (BID) | ORAL | Status: DC
  Administered 2023-12-27 – 2023-12-30 (×6): 1 via ORAL
  Filled 2023-12-27 (×6): qty 1

## 2023-12-27 MED ORDER — BISACODYL 10 MG RE SUPP: 10.0000 mg | Freq: Every day | RECTAL | Status: DC | PRN

## 2023-12-27 MED ORDER — SODIUM CHLORIDE 0.9 % IV SOLN: INTRAVENOUS | Status: AC | PRN

## 2023-12-27 NOTE — Progress Notes (Signed)
 SUBJECTIVE: Patient is breathing much better denies any further palpitation or chest pain   Vitals:   12/27/23 1000 12/27/23 1100 12/27/23 1200 12/27/23 1300  BP: (!) 126/35 129/69 132/76   Pulse: 62 66 60 70  Resp: 10 13 11 15   Temp:   98.4 F (36.9 C)   TempSrc:   Oral   SpO2: 98% 96% 97% 100%  Weight:      Height:        Intake/Output Summary (Last 24 hours) at 12/27/2023 1405 Last data filed at 12/27/2023 1200 Gross per 24 hour  Intake 855.16 ml  Output 30 ml  Net 825.16 ml    LABS: Basic Metabolic Panel: Recent Labs    12/26/23 0202 12/27/23 0346  NA 133* 137  K 4.5 3.8  CL 98 102  CO2 22 28  GLUCOSE 199* 188*  BUN 36* 27*  CREATININE 1.28* 1.02*  CALCIUM 8.9 8.3*  MG 2.2  --    Liver Function Tests: Recent Labs    12/24/23 2128  AST 19  ALT 31  ALKPHOS 99  BILITOT 0.5  PROT 6.9  ALBUMIN 3.7   No results for input(s): LIPASE, AMYLASE in the last 72 hours. CBC: Recent Labs    12/26/23 0202 12/27/23 0346  WBC 10.5 10.0  HGB 10.4* 10.0*  HCT 33.7* 31.9*  MCV 102.7* 100.6*  PLT 454* 378   Cardiac Enzymes: No results for input(s): CKTOTAL, CKMB, CKMBINDEX, TROPONINI in the last 72 hours. BNP: Invalid input(s): POCBNP D-Dimer: No results for input(s): DDIMER in the last 72 hours. Hemoglobin A1C: No results for input(s): HGBA1C in the last 72 hours. Fasting Lipid Panel: No results for input(s): CHOL, HDL, LDLCALC, TRIG, CHOLHDL, LDLDIRECT in the last 72 hours. Thyroid  Function Tests: No results for input(s): TSH, T4TOTAL, T3FREE, THYROIDAB in the last 72 hours.  Invalid input(s): FREET3 Anemia Panel: No results for input(s): VITAMINB12, FOLATE, FERRITIN, TIBC, IRON , RETICCTPCT in the last 72 hours.   PHYSICAL EXAM General: Well developed, well nourished, in no acute distress HEENT:  Normocephalic and atramatic Neck:  No JVD.  Lungs: Clear bilaterally to auscultation and  percussion. Heart: HRRR . Normal S1 and S2 without gallops or murmurs.  Abdomen: Bowel sounds are positive, abdomen soft and non-tender  Msk:  Back normal, normal gait. Normal strength and tone for age. Extremities: No clubbing, cyanosis or edema.   Neuro: Alert and oriented X 3. Psych:  Good affect, responds appropriately  TELEMETRY: Sinus rhythm  ASSESSMENT AND PLAN: Atrial fibrillation with rapid ventricular response rate/large pericardial effusion.  Status post pericardial effusion requiring drainage of 900 cc of fluid yesterday.  Patient is feeling much better and has converted to sinus rhythm from atrial fibrillation with rapid ventricular response rate.  May switch to p.o. amiodarone  400 mg p.o. twice daily and discontinue amiodarone  drip.  Mildly elevated troponin due to demand ischemia.   ICD-10-CM   1. Pericardial effusion  I31.39     2. Abdominal pain of multiple sites  R10.85     3. Acute appendicitis, unspecified acute appendicitis type  K35.80     4. Shortness of breath  R06.02     5. Chest pain, unspecified type  R07.9     6. 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

## 2023-12-27 NOTE — Progress Notes (Signed)
 " PROGRESS NOTE    Nancy [name]  FMW:[id number] DOB: Jan 20, 1965 DOA: 12/24/2023 PCP: Orlean Alan HERO, FNP    Brief Narrative:  58 y.o. female with medical history significant for HTN, DM , left BKA, asthma, morbid obesity, NICM (EF 70% 12/08/2023), stage 3 ovarian CA on maintenance chemotherapy, currently on hold due to chemotherapy induced diarrhea, being admitted with with a large pericardial effusion. Patient was recently hospitalized at [facility] (11/29 - 12/09/2023) with an acute pericardial effusion attributed to malignancy/chemo after presenting with chest pain and dyspnea on exertion.   Serial echocardiograms (most recently 12/09/23)showed stable moderate, circumferential pericardial effusion.  She was discharged on colchicine .  Since discharge she has had ongoing chest now extending to upper abdomen as well as dyspnea on exertion.  At her oncology follow-up on 12/24/2023 her oncologist ordered CT chest which showed a large effusion and she was directed to the ED for further workup..   In the ED, hemodynamically stable with BP 148/87 and pulse 99, respiratory rate 18 with O2 sat 96% on room air.  BMP and CBC at baseline, troponin and proBNP within normal limits.  Hepatic function panel not done.  EKG showed sinus tachycardia at 201 with low voltage QRS Chest x-ray showed cardiomegaly. Outpatient CT chest abdomen pelvis done earlier in the day showed new large pericardial effusion as well as possible appendicitis The ED provider spoke with cardiologist, Dr. Florencio who recommended admission and close hemodynamic monitoring.  Will see in the a.m. unless patient decompensates during the night, will evaluate sooner ED provider also spoke with surgery, Dr. Hezekiah regarding appendicitis seen on CT and advised Zosyn  and cardiac clearance prior to surgical intervention Admission requested   Cardiology and general surgery consulted.  Surgery not indicated at this time.  Clinical presentation is  not consistent with acute appendicitis.  Patient main complaint is upper abdominal pain associated with fatigue and nausea.  12/20: Large-volume pericardiocentesis performed.  Nearly 900 cc of bloody fluid liberated from pericardial effusion.  Patient feeling well overall.   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)   Diabetes mellitus, type 2 (HCC)   Obesity, Class III, BMI 40-49.9 (morbid obesity) (HCC)   Generalized anxiety disorder   Asthma without status asthmaticus   Neuropathy, diabetic (HCC)   Migraine without aura and without status migrainosus, not intractable   S/P left  BKA 09/24/22 secondary to osteomyelitis (below knee amputation) (HCC)   Chemotherapy induced diarrhea   Chronic obstructive pulmonary disease, unspecified COPD type (HCC)   Acute appendicitis   Acute pericardial effusion  Pericardial effusion, without tamponade Recent acute pericarditis 12/07/2023 at [facility](moderate effusion on echo 12/09/23) Hemodynamically stable Suspected secondary to no malignancy Pericardiocentesis performed 12/19 Nearly 1 L of blood-tinged fluid removed from pericardium Plan: Defer decision regarding discontinuation of pericardial drain to cardiology team.  Can discontinue colchicine  for now as patient is having some abdominal discomfort.  Continue telemetry monitoring.  Okay for diet.  Continue holding heparin .  Follow-up fluid studies from pericardial effusion.  New onset rapid atrial fibrillation Noted on telemetry and verified by twelve-lead EKG on 12/18 Plan: Discontinue amiodarone  gtt.  Start oral amiodarone  400 mg twice daily. Heparin  GTT on hold   NICM (nonischemic cardiomyopathy) (HCC) History of Takotsubo cardiomyopathy 2010 Last echo 12/09/2023 with EF 70%(previously 30% in 2010) Continue  carvedilol , lisinopril   with close BP monitoring in view of pericarditis Troponin and proBNP WNL Getting LFTs--> recently elevated at [facility], now normal.   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) Chemotherapy induced diarrhea(olaparib ) S/p neoadjuvant chemoradiation, on maintenance chemotherapy, currently held Recently saw oncology 12/22/2023 when chemo was held due to diarrhea   Diabetes mellitus, type 2 (HCC) Sliding scale insulin  coverage   Generalized anxiety disorder Continue Pristiq , mirtazapine  and alprazolam    Obesity, Class III, BMI 40-49.9 (morbid obesity) (HCC) Complicating factor to overall prognosis and care   Asthma without status asthmaticus Not acutely exacerbated Albuterol  nebulizer as needed   S/P left  BKA 09/24/22 secondary to osteomyelitis (below knee amputation) (HCC) No acute issues  Abdominal pain There was some clinical concern for appendicitis.  Patient has ovarian cancer.  Though the diagnosis of appendicitis is felt to be unlikely it is prudent to resume a 5-day course of antibiotics.  Unable to fully rule out appendicitis at this time.  No plans for surgical intervention. Plan: Continue antibiotics Repeat CT abdomen pelvis given complaints of upper abdominal fullness and pain     DVT prophylaxis: SCD Code Status: Full Family Communication: Spouse at bedside 12/18, 12/19 Disposition Plan: Status is: Inpatient Remains inpatient appropriate because: Multiple acute issues as above    Level of care: Progressive  Consultants:  General Surgery Cardiology  Procedures:  None  Antimicrobials: Zosyn    Subjective: And examined.  No acute events overnight.  Vitals remained stable.  Patient feeling better.  Objective: Vitals:   12/27/23 1000 12/27/23 1100 12/27/23 1200 12/27/23 1300  BP: (!) 126/35 129/69 132/76   Pulse: 62 66 60 70  Resp: 10 13 11 15   Temp:    98.4 F (36.9 C)   TempSrc:   Oral   SpO2: 98% 96% 97% 100%  Weight:      Height:        Intake/Output Summary (Last 24 hours) at 12/27/2023 1450 Last data filed at 12/27/2023 1400 Gross per 24 hour  Intake 1095.16 ml  Output 30 ml  Net 1065.16 ml   Filed Weights   12/24/23 1821  Weight: 130.6 kg    Examination:  General exam: Appears fatigued otherwise stable Respiratory system: Clear to auscultation. Respiratory effort normal. Cardiovascular system: Decreased heart sounds, S1-S2, no murmurs, no pedal edema Gastrointestinal system: Soft, NT/ND, normal bowel sounds Central nervous system: Alert and oriented. No focal neurological deficits. Extremities: Left lower extremity BKA Skin: No rashes, lesions or ulcers Psychiatry: Judgement and insight appear normal. Mood & affect appropriate.     Data Reviewed: I have personally reviewed following labs and imaging studies  CBC: Recent Labs  Lab 12/22/23 1308 12/24/23 1823 12/25/23 0523 12/26/23 0202 12/27/23 0346  WBC 8.4 9.6 7.8 10.5 10.0  NEUTROABS 4.5  --   --   --   --   HGB 9.9* 10.0* 10.0* 10.4* 10.0*  HCT 31.9* 32.0* 31.5* 33.7* 31.9*  MCV 102.6* 102.6* 101.0* 102.7* 100.6*  PLT 499* 492* 486* 454* 378   Basic Metabolic Panel: Recent Labs  Lab 12/22/23 1308 12/24/23 1823 12/25/23 0523 12/26/23 0202 12/27/23 0346  NA 137 131* 135 133* 137  K 4.4 4.4 4.3 4.5 3.8  CL 102 96* 99 98 102  CO2 23 23 24 22 28   GLUCOSE 163* 188* 178* 199* 188*  BUN 23* 27* 31* 36* 27*  CREATININE 0.90 0.88 1.15* 1.28* 1.02*  CALCIUM 9.4 8.8* 8.8* 8.9 8.3*  MG  2.1  --   --  2.2  --    GFR: Estimated Creatinine Clearance: 86.2 mL/min (A) (by C-G formula based on SCr of 1.02 mg/dL (H)). Liver Function Tests: Recent Labs  Lab 12/22/23 1308 12/24/23 2128  AST 21 19  ALT 31 31  ALKPHOS 92 99  BILITOT 0.4 0.5  PROT 7.5 6.9  ALBUMIN 4.1 3.7   No results for input(s): LIPASE, AMYLASE in the last 168 hours. No  results for input(s): AMMONIA in the last 168 hours. Coagulation Profile: No results for input(s): INR, PROTIME in the last 168 hours. Cardiac Enzymes: No results for input(s): CKTOTAL, CKMB, CKMBINDEX, TROPONINI in the last 168 hours. BNP (last 3 results) Recent Labs    12/24/23 1823  PROBNP 211.0   HbA1C: No results for input(s): HGBA1C in the last 72 hours. CBG: Recent Labs  Lab 12/26/23 1039 12/26/23 1555 12/26/23 2139 12/27/23 0732 12/27/23 1107  GLUCAP 173* 143* 197* 213* 165*   Lipid Profile: No results for input(s): CHOL, HDL, LDLCALC, TRIG, CHOLHDL, LDLDIRECT in the last 72 hours. Thyroid  Function Tests: No results for input(s): TSH, T4TOTAL, FREET4, T3FREE, THYROIDAB in the last 72 hours. Anemia Panel: No results for input(s): VITAMINB12, FOLATE, FERRITIN, TIBC, IRON , RETICCTPCT in the last 72 hours. Sepsis Labs: No results for input(s): PROCALCITON, LATICACIDVEN in the last 168 hours.  Recent Results (from the past 240 hours)  Body fluid culture w Gram Stain     Status: None (Preliminary result)   Collection Time: 12/26/23  9:47 AM   Specimen: PATH Cytology Misc. fluid; Body Fluid  Result Value Ref Range Status   Specimen Description   Final    PERICARDIAL Performed at [facility], [address] [place], [place] [address]    Special Requests   Final    PERICARDIAL Performed at [facility], [address] [place], [place] [address]    Gram Stain   Final    FEW WBC PRESENT, PREDOMINANTLY MONONUCLEAR NO ORGANISMS SEEN    Culture   Final    NO GROWTH < 24 HOURS Performed at [facility], [address], [place], [place] [address]    Report Status PENDING  Incomplete  MRSA Next Gen by PCR, Nasal     Status: None   Collection Time: 12/26/23 10:50 AM   Specimen: Nasal Mucosa; Nasal Swab  Result Value Ref Range Status   MRSA by PCR Next Gen NOT DETECTED NOT DETECTED Final     Comment: (NOTE) The GeneXpert MRSA Assay (FDA approved for NASAL specimens only), is one component of a comprehensive MRSA colonization surveillance program. It is not intended to diagnose MRSA infection nor to guide or monitor treatment for MRSA infections. Test performance is not FDA approved in patients less than 39 years old. Performed at [facility], [address], [place], [place] [address]          Radiology Studies: ECHOCARDIOGRAM LIMITED Result Date: 12/26/2023    ECHOCARDIOGRAM LIMITED REPORT   Patient Name:   Nancy [name] Date of Exam: 12/26/2023 Medical Rec #:  [id number]           Height:       69.0 in Accession #:    [id number]          Weight:       288.0 lb Date of Birth:  04-13-1965          BSA:  2.412 m Patient Age:    58 years            BP:           145/90 mmHg Patient Gender: F                   HR:           70 bpm. Exam Location:  [facility] Procedure: Limited Echo, Color Doppler and Saline Contrast Bubble Study (Both            Spectral and Color Flow Doppler were utilized during procedure). Indications:     Pericardial effusion I31.3  History:         Patient has prior history of Echocardiogram examinations, most                  recent 12/25/2023.  Sonographer:     Ashley McNeely-Sloane Referring Phys:  [id number] CARALYN HUDSON Diagnosing Phys: Deatrice Cage MD IMPRESSIONS  1. Left ventricular ejection fraction, by estimation, is 55 to 60%. The left ventricle has normal function. The left ventricle has no regional wall motion abnormalities. Left ventricular diastolic function could not be evaluated.  2. Right ventricular systolic function is normal. The right ventricular size is normal.  3. Large pericardial effusion initially noted. post pericardiocentesis, effusion is small with no tamponade. Agitated saline was injected via the catheter which confirmed position in pericardial space.  4. The mitral valve was not well visualized. No evidence of  mitral valve regurgitation. No evidence of mitral stenosis.  5. The aortic valve was not well visualized. Aortic valve regurgitation is not visualized. No aortic stenosis is present. FINDINGS  Left Ventricle: Left ventricular ejection fraction, by estimation, is 55 to 60%. The left ventricle has normal function. The left ventricle has no regional wall motion abnormalities. The left ventricular internal cavity size was normal in size. There is  no left ventricular hypertrophy. Left ventricular diastolic function could not be evaluated. Right Ventricle: The right ventricular size is normal. No increase in right ventricular wall thickness. Right ventricular systolic function is normal. Left Atrium: Left atrial size was normal in size. Right Atrium: Right atrial size was normal in size. Pericardium: A large pericardial effusion is present. There is no evidence of cardiac tamponade. Mitral Valve: The mitral valve was not well visualized. Mild mitral annular calcification. No evidence of mitral valve stenosis. Tricuspid Valve: The tricuspid valve is not well visualized. Tricuspid valve regurgitation is not demonstrated. No evidence of tricuspid stenosis. Aortic Valve: The aortic valve was not well visualized. Aortic valve regurgitation is not visualized. No aortic stenosis is present. Pulmonic Valve: The pulmonic valve was not well visualized. Pulmonic valve regurgitation is not visualized. No evidence of pulmonic stenosis. Aorta: The aortic root is normal in size and structure. IAS/Shunts: No atrial level shunt detected by color flow Doppler. Agitated saline contrast was given intravenously to evaluate for intracardiac shunting. Deatrice Cage MD Electronically signed by Deatrice Cage MD Signature Date/Time: 12/26/2023/2:38:28 PM    Final    CARDIAC CATHETERIZATION Result Date: 12/26/2023 Successful pericardiocentesis via the subxiphoid area.  A total of 940 mL of bloody fluid was removed. Recommendations: Fluid was  sent to the lab for analysis including cytology. Continue drainage to gravity and if output is less than 50 mL in 24 hours and echocardiogram shows resolution of pericardial effusion, the drain can be removed. I ordered her repeat limited echocardiogram to be done tomorrow. Recommend holding anticoagulation for at least 24  hours given bloody effusion.   ECHOCARDIOGRAM COMPLETE Result Date: 12/25/2023    ECHOCARDIOGRAM REPORT   Patient Name:   Nancy [name] Date of Exam: 12/25/2023 Medical Rec #:  [id number]           Height:       69.0 in Accession #:    [id number]          Weight:       288.0 lb Date of Birth:  1965-01-19          BSA:          2.412 m Patient Age:    58 years            BP:           127/90 mmHg Patient Gender: F                   HR:           86 bpm. Exam Location:  [facility] Procedure: 2D Echo, Cardiac Doppler, Color Doppler and Intracardiac            Opacification Agent (Both Spectral and Color Flow Doppler were            utilized during procedure). Indications:     Pericardial effusion I31.3  History:         Patient has prior history of Echocardiogram examinations, most                  recent 09/22/2022. Ovarian cancer, Arrythmias:Atrial                  Fibrillation; Signs/Symptoms:Dyspnea.  Sonographer:     Ashley McNeely-Sloane Referring Phys:  [id number] CARALYN HUDSON Diagnosing Phys: Marsa Dooms MD IMPRESSIONS  1. Left ventricular ejection fraction, by estimation, is 55 to 60%. The left ventricle has normal function. The left ventricle has no regional wall motion abnormalities. Left ventricular diastolic parameters are consistent with Grade I diastolic dysfunction (impaired relaxation).  2. Right ventricular systolic function is normal. The right ventricular size is normal.  3. Large pericardial effusion. The pericardial effusion is circumferential. There is no evidence of cardiac tamponade.  4. The mitral valve is normal in structure. Trivial mitral valve regurgitation. No  evidence of mitral stenosis.  5. The aortic valve is normal in structure. Aortic valve regurgitation is not visualized. No aortic stenosis is present.  6. The inferior vena cava is normal in size with greater than 50% respiratory variability, suggesting right atrial pressure of 3 mmHg. FINDINGS  Left Ventricle: Left ventricular ejection fraction, by estimation, is 55 to 60%. The left ventricle has normal function. The left ventricle has no regional wall motion abnormalities. Definity  contrast agent was given IV to delineate the left ventricular  endocardial borders. Strain was performed and the global longitudinal strain is indeterminate. The left ventricular internal cavity size was normal in size. There is no left ventricular hypertrophy. Left ventricular diastolic parameters are consistent with Grade I diastolic dysfunction (impaired relaxation). Right Ventricle: The right ventricular size is normal. No increase in right ventricular wall thickness. Right ventricular systolic function is normal. Left Atrium: Left atrial size was normal in size. Right Atrium: Right atrial size was normal in size. Pericardium: A large pericardial effusion is present. The pericardial effusion is circumferential. There is no evidence of cardiac tamponade. Mitral Valve: The mitral valve is normal in structure. Trivial mitral valve regurgitation. No evidence of mitral valve stenosis. MV peak gradient, 3.6 mmHg. The  mean mitral valve gradient is 2.0 mmHg. Tricuspid Valve: The tricuspid valve is normal in structure. Tricuspid valve regurgitation is trivial. No evidence of tricuspid stenosis. Aortic Valve: The aortic valve is normal in structure. Aortic valve regurgitation is not visualized. No aortic stenosis is present. Aortic valve mean gradient measures 3.0 mmHg. Aortic valve peak gradient measures 4.9 mmHg. Aortic valve area, by VTI measures 3.23 cm. Pulmonic Valve: The pulmonic valve was normal in structure. Pulmonic valve  regurgitation is not visualized. No evidence of pulmonic stenosis. Aorta: The aortic root is normal in size and structure. Venous: The inferior vena cava is normal in size with greater than 50% respiratory variability, suggesting right atrial pressure of 3 mmHg. IAS/Shunts: No atrial level shunt detected by color flow Doppler. Additional Comments: 3D was performed not requiring image post processing on an independent workstation and was indeterminate.  LEFT VENTRICLE PLAX 2D LVIDd:         2.90 cm     Diastology LVIDs:         2.10 cm     LV e' medial:    6.31 cm/s LV PW:         2.20 cm     LV E/e' medial:  14.8 LV IVS:        1.30 cm     LV e' lateral:   5.44 cm/s LVOT diam:     2.10 cm     LV E/e' lateral: 17.1 LV SV:         59 LV SV Index:   24 LVOT Area:     3.46 cm  LV Volumes (MOD) LV vol d, MOD A2C: 77.9 ml LV vol s, MOD A2C: 20.5 ml LV SV MOD A2C:     57.4 ml RIGHT VENTRICLE             IVC RV S prime:     18.50 cm/s  IVC diam: 2.30 cm TAPSE (M-mode): 2.1 cm LEFT ATRIUM           Index        RIGHT ATRIUM           Index LA Vol (A4C): 71.1 ml 29.47 ml/m  RA Area:     22.70 cm                                    RA Volume:   68.80 ml  28.52 ml/m  AORTIC VALVE                    PULMONIC VALVE AV Area (Vmax):    3.32 cm     PV Vmax:        0.89 m/s AV Area (Vmean):   3.20 cm     PV Vmean:       63.700 cm/s AV Area (VTI):     3.23 cm     PV VTI:         0.158 m AV Vmax:           110.50 cm/s  PV Peak grad:   3.2 mmHg AV Vmean:          75.650 cm/s  PV Mean grad:   2.0 mmHg AV VTI:            0.181 m      RVOT Peak grad: 2 mmHg AV Peak Grad:      4.9  mmHg AV Mean Grad:      3.0 mmHg LVOT Vmax:         106.00 cm/s LVOT Vmean:        69.800 cm/s LVOT VTI:          0.169 m LVOT/AV VTI ratio: 0.93  AORTA Ao Root diam: 2.90 cm Ao Asc diam:  3.80 cm MITRAL VALVE MV Area (PHT): 3.17 cm     SHUNTS MV Area VTI:   3.68 cm     Systemic VTI:  0.17 m MV Peak grad:  3.6 mmHg     Systemic Diam: 2.10 cm MV Mean grad:   2.0 mmHg     Pulmonic VTI:  0.119 m MV Vmax:       0.95 m/s MV Vmean:      58.9 cm/s MV Decel Time: 239 msec MV E velocity: 93.20 cm/s MV A velocity: 112.00 cm/s MV E/A ratio:  0.83 Marsa Dooms MD Electronically signed by Marsa Dooms MD Signature Date/Time: 12/25/2023/4:32:43 PM    Final         Scheduled Meds:  [START ON 12/28/2023] apixaban   5 mg Oral BID   Chlorhexidine  Gluconate Cloth  6 each Topical Daily   insulin  aspart  0-15 Units Subcutaneous TID WC   insulin  aspart  0-5 Units Subcutaneous QHS   pantoprazole   40 mg Oral Daily   venlafaxine  XR  150 mg Oral Q breakfast   Continuous Infusions:  amiodarone  30 mg/hr (12/27/23 1200)   piperacillin -tazobactam (ZOSYN )  IV 3.375 g (12/27/23 1422)   promethazine  (PHENERGAN ) injection (IM or IVPB) Stopped (12/27/23 0955)     LOS: 2 days      Calvin KATHEE Robson, MD [facility]   If 7PM-7AM, please contact night-coverage  12/27/2023, 2:50 PM    "

## 2023-12-27 NOTE — Plan of Care (Addendum)
 This patient remains on AR-ICU/SDU as of time of writing. The patient is AA+Ox4; high fall risk due to LBKA - yellow armband applied to the patient overnight per protocol. The patient remains on an active infusion of Amiodarone , which is infusing via her port. The patient has no supplemental O2 requirement at this time. The patient ambulates with stand-by assistance only while utilizing her prosthesis and a walker. The patient has progressive level of care orders, and is awaiting PCU bed placement at this time. The patient has a pericardial drain the her left chest wall with scant, serosanguineous output. The patient's admission profile was completed overnight by this RN.   Problem: Education: Goal: Ability to describe self-care measures that may prevent or decrease complications (Diabetes Survival Skills Education) will improve Outcome: Progressing Goal: Individualized Educational Video(s) Outcome: Progressing   Problem: Coping: Goal: Ability to adjust to condition or change in health will improve Outcome: Progressing   Problem: Fluid Volume: Goal: Ability to maintain a balanced intake and output will improve Outcome: Progressing   Problem: Health Behavior/Discharge Planning: Goal: Ability to identify and utilize available resources and services will improve Outcome: Progressing Goal: Ability to manage health-related needs will improve Outcome: Progressing   Problem: Metabolic: Goal: Ability to maintain appropriate glucose levels will improve Outcome: Progressing   Problem: Nutritional: Goal: Maintenance of adequate nutrition will improve Outcome: Progressing Goal: Progress toward achieving an optimal weight will improve Outcome: Progressing   Problem: Skin Integrity: Goal: Risk for impaired skin integrity will decrease Outcome: Progressing   Problem: Tissue Perfusion: Goal: Adequacy of tissue perfusion will improve Outcome: Progressing   Problem: Education: Goal:  Knowledge of General Education information will improve Description: Including pain rating scale, medication(s)/side effects and non-pharmacologic comfort measures Outcome: Progressing   Problem: Health Behavior/Discharge Planning: Goal: Ability to manage health-related needs will improve Outcome: Progressing   Problem: Clinical Measurements: Goal: Ability to maintain clinical measurements within normal limits will improve Outcome: Progressing Goal: Will remain free from infection Outcome: Progressing Goal: Diagnostic test results will improve Outcome: Progressing Goal: Respiratory complications will improve Outcome: Progressing Goal: Cardiovascular complication will be avoided Outcome: Progressing   Problem: Activity: Goal: Risk for activity intolerance will decrease Outcome: Progressing   Problem: Nutrition: Goal: Adequate nutrition will be maintained Outcome: Progressing   Problem: Coping: Goal: Level of anxiety will decrease Outcome: Progressing   Problem: Elimination: Goal: Will not experience complications related to bowel motility Outcome: Progressing Goal: Will not experience complications related to urinary retention Outcome: Progressing   Problem: Pain Managment: Goal: General experience of comfort will improve and/or be controlled Outcome: Progressing   Problem: Safety: Goal: Ability to remain free from injury will improve Outcome: Progressing   Problem: Skin Integrity: Goal: Risk for impaired skin integrity will decrease Outcome: Progressing   Problem: Education: Goal: Knowledge of the prescribed therapeutic regimen will improve Outcome: Progressing   Problem: Activity: Goal: Ability to implement measures to reduce episodes of fatigue will improve Outcome: Progressing   Problem: Bowel/Gastric: Goal: Will not experience complications related to bowel motility Outcome: Progressing   Problem: Coping: Goal: Ability to identify and develop  effective coping behavior will improve Outcome: Progressing   Problem: Nutritional: Goal: Maintenance of adequate nutrition will improve Outcome: Progressing   Problem: Education: Goal: Ability to demonstrate management of disease process will improve Outcome: Progressing Goal: Ability to verbalize understanding of medication therapies will improve Outcome: Progressing Goal: Individualized Educational Video(s) Outcome: Progressing   Problem: Activity: Goal: Capacity to carry out activities will  improve Outcome: Progressing   Problem: Cardiac: Goal: Ability to achieve and maintain adequate cardiopulmonary perfusion will improve Outcome: Progressing   Problem: Education: Goal: Knowledge of disease or condition will improve Outcome: Progressing Goal: Knowledge of the prescribed therapeutic regimen will improve Outcome: Progressing Goal: Individualized Educational Video(s) Outcome: Progressing   Problem: Activity: Goal: Ability to tolerate increased activity will improve Outcome: Progressing Goal: Will verbalize the importance of balancing activity with adequate rest periods Outcome: Progressing   Problem: Respiratory: Goal: Ability to maintain a clear airway will improve Outcome: Progressing Goal: Levels of oxygenation will improve Outcome: Progressing Goal: Ability to maintain adequate ventilation will improve Outcome: Progressing

## 2023-12-27 NOTE — Plan of Care (Signed)

## 2023-12-27 NOTE — Plan of Care (Signed)
" °  Problem: Education: Goal: Ability to describe self-care measures that may prevent or decrease complications (Diabetes Survival Skills Education) will improve Outcome: Progressing   Problem: Coping: Goal: Ability to adjust to condition or change in health will improve Outcome: Progressing   Problem: Fluid Volume: Goal: Ability to maintain a balanced intake and output will improve Outcome: Progressing   Problem: Health Behavior/Discharge Planning: Goal: Ability to identify and utilize available resources and services will improve Outcome: Progressing Goal: Ability to manage health-related needs will improve Outcome: Progressing   Problem: Metabolic: Goal: Ability to maintain appropriate glucose levels will improve Outcome: Progressing   Problem: Clinical Measurements: Goal: Ability to maintain clinical measurements within normal limits will improve Outcome: Progressing Goal: Cardiovascular complication will be avoided Outcome: Progressing   "

## 2023-12-28 ENCOUNTER — Inpatient Hospital Stay
Admit: 2023-12-28 | Discharge: 2023-12-28 | Disposition: A | Attending: Cardiovascular Disease | Admitting: Cardiovascular Disease

## 2023-12-28 DIAGNOSIS — I3139 Other pericardial effusion (noninflammatory): Secondary | ICD-10-CM | POA: Diagnosis not present

## 2023-12-28 DIAGNOSIS — I4891 Unspecified atrial fibrillation: Secondary | ICD-10-CM | POA: Diagnosis not present

## 2023-12-28 LAB — CBC
HCT: 29 % — ABNORMAL LOW (ref 36.0–46.0)
HCT: 30.6 % — ABNORMAL LOW (ref 36.0–46.0)
Hemoglobin: 8.9 g/dL — ABNORMAL LOW (ref 12.0–15.0)
Hemoglobin: 9.6 g/dL — ABNORMAL LOW (ref 12.0–15.0)
MCH: 30.9 pg (ref 26.0–34.0)
MCH: 31.3 pg (ref 26.0–34.0)
MCHC: 30.7 g/dL (ref 30.0–36.0)
MCHC: 31.4 g/dL (ref 30.0–36.0)
MCV: 100.7 fL — ABNORMAL HIGH (ref 80.0–100.0)
MCV: 99.7 fL (ref 80.0–100.0)
Platelets: 311 K/uL (ref 150–400)
Platelets: 321 K/uL (ref 150–400)
RBC: 2.88 MIL/uL — ABNORMAL LOW (ref 3.87–5.11)
RBC: 3.07 MIL/uL — ABNORMAL LOW (ref 3.87–5.11)
RDW: 15.9 % — ABNORMAL HIGH (ref 11.5–15.5)
RDW: 15.9 % — ABNORMAL HIGH (ref 11.5–15.5)
WBC: 7 K/uL (ref 4.0–10.5)
WBC: 7.5 K/uL (ref 4.0–10.5)
nRBC: 0 % (ref 0.0–0.2)
nRBC: 0 % (ref 0.0–0.2)

## 2023-12-28 LAB — ECHOCARDIOGRAM LIMITED
Height: 69 in
S' Lateral: 3.1 cm
Weight: 4608 [oz_av]

## 2023-12-28 LAB — BASIC METABOLIC PANEL WITH GFR
Anion gap: 6 (ref 5–15)
BUN: 18 mg/dL (ref 6–20)
CO2: 30 mmol/L (ref 22–32)
Calcium: 8.2 mg/dL — ABNORMAL LOW (ref 8.9–10.3)
Chloride: 103 mmol/L (ref 98–111)
Creatinine, Ser: 0.93 mg/dL (ref 0.44–1.00)
GFR, Estimated: >60 mL/min
Glucose, Bld: 188 mg/dL — ABNORMAL HIGH (ref 70–99)
Potassium: 3.7 mmol/L (ref 3.5–5.1)
Sodium: 139 mmol/L (ref 135–145)

## 2023-12-28 LAB — GLUCOSE, CAPILLARY
Glucose-Capillary: 183 mg/dL — ABNORMAL HIGH (ref 70–99)
Glucose-Capillary: 133 mg/dL — ABNORMAL HIGH (ref 70–99)
Glucose-Capillary: 196 mg/dL — ABNORMAL HIGH (ref 70–99)
Glucose-Capillary: 208 mg/dL — ABNORMAL HIGH (ref 70–99)
Glucose-Capillary: 206 mg/dL — ABNORMAL HIGH (ref 70–99)

## 2023-12-28 MED ORDER — CARVEDILOL 6.25 MG PO TABS
6.2500 mg | ORAL_TABLET | Freq: Two times a day (BID) | ORAL | Status: DC
  Administered 2023-12-28 – 2023-12-30 (×4): 6.25 mg via ORAL
  Filled 2023-12-28 (×4): qty 1

## 2023-12-28 MED ORDER — AMIODARONE HCL 200 MG PO TABS
400.0000 mg | ORAL_TABLET | Freq: Two times a day (BID) | ORAL | Status: DC
  Administered 2023-12-28 – 2023-12-29 (×3): 400 mg via ORAL
  Filled 2023-12-28 (×3): qty 2

## 2023-12-28 MED ORDER — POLYETHYLENE GLYCOL 3350 17 G PO PACK
17.0000 g | PACK | Freq: Every day | ORAL | Status: DC
  Filled 2023-12-28 (×2): qty 1

## 2023-12-28 MED ORDER — PIPERACILLIN-TAZOBACTAM 3.375 G IVPB
3.3750 g | Freq: Three times a day (TID) | INTRAVENOUS | Status: DC
  Administered 2023-12-28 – 2023-12-30 (×5): 3.375 g via INTRAVENOUS
  Filled 2023-12-28 (×5): qty 50

## 2023-12-28 NOTE — Progress Notes (Signed)
" °  Echocardiogram 2D Echocardiogram has been performed. A Limited Echo was requested and performed.  Thedora GORMAN Louder 12/28/2023, 8:16 AM "

## 2023-12-28 NOTE — Progress Notes (Signed)
 SUBJECTIVE: Patient is feeling much better after pericardiocentesis.   Vitals:   12/28/23 0400 12/28/23 0600 12/28/23 0800 12/28/23 0834  BP: 117/65 (!) 151/65 (!) 158/71   Pulse: 60 63 66   Resp: 11 13 11    Temp: 98.4 F (36.9 C)  98.4 F (36.9 C)   TempSrc: Oral  Oral   SpO2: 94% 92% 94% (!) 88%  Weight:      Height:        Intake/Output Summary (Last 24 hours) at 12/28/2023 1322 Last data filed at 12/28/2023 1100 Gross per 24 hour  Intake 723.34 ml  Output 5 ml  Net 718.34 ml    LABS: Basic Metabolic Panel: Recent Labs    12/26/23 0202 12/27/23 0346 12/28/23 0429  NA 133* 137 139  K 4.5 3.8 3.7  CL 98 102 103  CO2 22 28 30   GLUCOSE 199* 188* 188*  BUN 36* 27* 18  CREATININE 1.28* 1.02* 0.93  CALCIUM 8.9 8.3* 8.2*  MG 2.2  --   --    Liver Function Tests: No results for input(s): AST, ALT, ALKPHOS, BILITOT, PROT, ALBUMIN in the last 72 hours. No results for input(s): LIPASE, AMYLASE in the last 72 hours. CBC: Recent Labs    12/27/23 0346 12/28/23 0429  WBC 10.0 7.0  HGB 10.0* 8.9*  HCT 31.9* 29.0*  MCV 100.6* 100.7*  PLT 378 311   Cardiac Enzymes: No results for input(s): CKTOTAL, CKMB, CKMBINDEX, TROPONINI in the last 72 hours. BNP: Invalid input(s): POCBNP D-Dimer: No results for input(s): DDIMER in the last 72 hours. Hemoglobin A1C: No results for input(s): HGBA1C in the last 72 hours. Fasting Lipid Panel: No results for input(s): CHOL, HDL, LDLCALC, TRIG, CHOLHDL, LDLDIRECT in the last 72 hours. Thyroid  Function Tests: No results for input(s): TSH, T4TOTAL, T3FREE, THYROIDAB in the last 72 hours.  Invalid input(s): FREET3 Anemia Panel: No results for input(s): VITAMINB12, FOLATE, FERRITIN, TIBC, IRON , RETICCTPCT in the last 72 hours.   PHYSICAL EXAM General: Well developed, well nourished, in no acute distress HEENT:  Normocephalic and atramatic Neck:  No JVD.  Lungs:  Clear bilaterally to auscultation and percussion. Heart: HRRR . Normal S1 and S2 without gallops or murmurs.  Abdomen: Bowel sounds are positive, abdomen soft and non-tender  Msk:  Back normal, normal gait. Normal strength and tone for age. Extremities: No clubbing, cyanosis or edema.   Neuro: Alert and oriented X 3. Psych:  Good affect, responds appropriately  TELEMETRY: Sinus rhythm 70 bpm  ASSESSMENT AND PLAN: #1 large pericardial effusion requiring pericardiocentesis on Friday with continuous drainage although decreasing still occurring.  Will keep the drainage overnight and take it out in the morning.  Echocardiogram showed stable small anterior pericardial effusion. #2 atrial fibrillation with rapid ventricular response rate, will discontinue IV amiodarone  and start p.o. amiodarone .   ICD-10-CM   1. Pericardial effusion  I31.39     2. Abdominal pain of multiple sites  R10.85     3. Acute appendicitis, unspecified acute appendicitis type  K35.80     4. Shortness of breath  R06.02     5. Chest pain, unspecified type  R07.9     6. 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

## 2023-12-28 NOTE — Progress Notes (Signed)
 " PROGRESS NOTE    KRITHI BRAY  FMW:[id number] DOB: [birth year] DOA: 12/24/2023 PCP: Orlean Alan HERO, FNP    Brief Narrative:  58 y.o. female with medical history significant for HTN, DM , left BKA, asthma, morbid obesity, NICM (EF 70% 12/08/2023), stage 3 ovarian CA on maintenance chemotherapy, currently on hold due to chemotherapy induced diarrhea, being admitted with with a large pericardial effusion. Patient was recently hospitalized at [facility] (11/29 - 12/09/2023) with an acute pericardial effusion attributed to malignancy/chemo after presenting with chest pain and dyspnea on exertion.   Serial echocardiograms (most recently 12/09/23)showed stable moderate, circumferential pericardial effusion.  She was discharged on colchicine .  Since discharge she has had ongoing chest now extending to upper abdomen as well as dyspnea on exertion.  At her oncology follow-up on 12/24/2023 her oncologist ordered CT chest which showed a large effusion and she was directed to the ED for further workup..   In the ED, hemodynamically stable with BP 148/87 and pulse 99, respiratory rate 18 with O2 sat 96% on room air.  BMP and CBC at baseline, troponin and proBNP within normal limits.  Hepatic function panel not done.  EKG showed sinus tachycardia at 201 with low voltage QRS Chest x-ray showed cardiomegaly. Outpatient CT chest abdomen pelvis done earlier in the day showed new large pericardial effusion as well as possible appendicitis The ED provider spoke with cardiologist, Dr. Florencio who recommended admission and close hemodynamic monitoring.  Will see in the a.m. unless patient decompensates during the night, will evaluate sooner ED provider also spoke with surgery, Dr. Hezekiah regarding appendicitis seen on CT and advised Zosyn  and cardiac clearance prior to surgical intervention Admission requested   Cardiology and general surgery consulted.  Surgery not indicated at this time.  Clinical presentation is  not consistent with acute appendicitis.  Patient main complaint is upper abdominal pain associated with fatigue and nausea.  12/20: Large-volume pericardiocentesis performed.  Nearly 900 cc of bloody fluid liberated from pericardial effusion.  Patient feeling well overall.    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)   Diabetes mellitus, type 2 (HCC)   Obesity, Class III, BMI 40-49.9 (morbid obesity) (HCC)   Generalized anxiety disorder   Asthma without status asthmaticus   Neuropathy, diabetic (HCC)   Migraine without aura and without status migrainosus, not intractable   S/P left  BKA 09/24/22 secondary to osteomyelitis (below knee amputation) (HCC)   Chemotherapy induced diarrhea   Chronic obstructive pulmonary disease, unspecified COPD type (HCC)   Acute appendicitis   Acute pericardial effusion  Pericardial effusion, without tamponade Recent acute pericarditis 12/07/2023 at [facility](moderate effusion on echo 12/09/23) Hemodynamically stable Unclear etiology  Pericardiocentesis performed 12/19 Nearly 1 L of blood-tinged fluid removed from pericardium Plan: Cardiology following [facility] restarted  Holding colchcine  -F/u repeat TTE   New onset rapid atrial fibrillation Resolved  Noted on telemetry and verified by twelve-lead EKG on 12/18 Plan: Continue amiodarone , AC per cardiology    NICM (nonischemic cardiomyopathy) (HCC) History of Takotsubo cardiomyopathy 2010 Last echo 12/09/2023 with EF 70%(previously 30% in 2010) Continue metoprolol , resume ACE when able.   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 Hold home antidiabetes medications. Sliding scale insulin  coverage   Generalized anxiety disorder Continue Pristiq , mirtazapine  and alprazolam    Obesity, Class III, BMI 40-49.9 (morbid obesity) (HCC) Complicating factor to overall prognosis and care   Asthma without status asthmaticus Not acutely exacerbated Albuterol  nebulizer as needed   S/P left  BKA 09/24/22 secondary to osteomyelitis (below knee amputation) (HCC) No acute issues  Abdominal pain There was some clinical concern for appendicitis.  Patient has ovarian cancer.  Though the diagnosis of appendicitis is felt to be unlikely it is prudent to resume a 5-day course of antibiotics per general surgery. She did have repeat CT A/P with no findings to suggest etiology for her symptoms. Her abdominal discomfort is much improved on exam. She states that her pain is mostly at the insertion of the pericardial drain and along the epigastrium.   -Complete five d course of antibiotics -Pain control   DVT prophylaxis: SCD Code Status: Full Family Communication: Spouse at bedside 12/18, 12/10, none at bedside this AM  Disposition Plan: Status is: Inpatient Remains inpatient appropriate because: Multiple acute issues as above    Level of care: Progressive  Consultants:  General Surgery Cardiology  Procedures:  None  Antimicrobials: Zosyn    Subjective: Abd pain is improved. Having pain where drain is inserted and along this region. APAP and oxy help with this pain.   Had bowel movement yesterday   Objective: Vitals:   12/28/23 0400 12/28/23 0600 12/28/23 0800 12/28/23 0834  BP: 117/65 (!) 151/65 (!) 158/71   Pulse: 60 63 66   Resp: 11 13 11    Temp: 98.4 F (36.9 C)  98.4 F (36.9 C)   TempSrc: Oral  Oral   SpO2: 94% 92% 94% (!) 88%  Weight:      Height:        Intake/Output Summary (Last 24 hours) at  12/28/2023 0924 Last data filed at 12/28/2023 0400 Gross per 24 hour  Intake 854.54 ml  Output 5 ml  Net 849.54 ml   Filed Weights   12/24/23 1821  Weight: 130.6 kg    Examination:    Constitutional: In no distress.  Cardiovascular: Normal rate, regular rhythm. Pericardial drain in place w/ scant mostly sanguinous drainage. No lower extremity edema  Pulmonary: Non labored breathing on room air, no wheezing or rales.   Abdominal: Soft. Non distended and non tender Musculoskeletal: s/p L BKA    Neurological: Alert and oriented to person, place, and time. Non focal  Skin: Skin is warm and dry.    Data Reviewed: I have personally reviewed following labs and imaging studies  CBC: Recent Labs  Lab 12/22/23 1308 12/24/23 1823 12/25/23 0523 12/26/23 0202 12/27/23 0346 12/28/23 0429  WBC 8.4 9.6 7.8 10.5 10.0 7.0  NEUTROABS 4.5  --   --   --   --   --   HGB 9.9* 10.0* 10.0* 10.4* 10.0* 8.9*  HCT 31.9* 32.0* 31.5* 33.7* 31.9* 29.0*  MCV 102.6* 102.6* 101.0* 102.7* 100.6* 100.7*  PLT 499* 492* 486* 454* 378 311   Basic Metabolic Panel: Recent Labs  Lab 12/22/23 1308 12/24/23 1823 12/25/23 0523 12/26/23 0202 12/27/23 0346 12/28/23 0429  NA 137 131* 135 133* 137 139  K 4.4 4.4 4.3 4.5 3.8 3.7  CL 102 96* 99 98 102 103  CO2 23 23 24 22 28 30   GLUCOSE 163* 188* 178* 199* 188* 188*  BUN 23* 27* 31* 36* 27* 18  CREATININE 0.90 0.88 1.15* 1.28* 1.02* 0.93  CALCIUM 9.4 8.8* 8.8* 8.9 8.3* 8.2*  MG 2.1  --   --  2.2  --   --    GFR: Estimated Creatinine Clearance: 94.6 mL/min (by C-G formula based on SCr of 0.93 mg/dL). Liver Function Tests: Recent Labs  Lab 12/22/23 1308 12/24/23 2128  AST 21 19  ALT 31 31  ALKPHOS 92 99  BILITOT 0.4 0.5  PROT 7.5 6.9  ALBUMIN 4.1 3.7   No results for input(s): LIPASE, AMYLASE in the last 168 hours. No results for input(s): AMMONIA in the last 168 hours. Coagulation Profile: No results for input(s): INR, PROTIME in  the last 168 hours. Cardiac Enzymes: No results for input(s): CKTOTAL, CKMB, CKMBINDEX, TROPONINI in the last 168 hours. BNP (last 3 results) Recent Labs    12/24/23 1823  PROBNP 211.0   HbA1C: No results for input(s): HGBA1C in the last 72 hours. CBG: Recent Labs  Lab 12/27/23 0732 12/27/23 1107 12/27/23 1542 12/27/23 2202 12/28/23 0745  GLUCAP 213* 165* 140* 194* 183*   Lipid Profile: No results for input(s): CHOL, HDL, LDLCALC, TRIG, CHOLHDL, LDLDIRECT in the last 72 hours. Thyroid  Function Tests: No results for input(s): TSH, T4TOTAL, FREET4, T3FREE, THYROIDAB in the last 72 hours. Anemia Panel: No results for input(s): VITAMINB12, FOLATE, FERRITIN, TIBC, IRON , RETICCTPCT in the last 72 hours. Sepsis Labs: No results for input(s): PROCALCITON, LATICACIDVEN in the last 168 hours.  Recent Results (from the past 240 hours)  Body fluid culture w Gram Stain     Status: None (Preliminary result)   Collection Time: 12/26/23  9:47 AM   Specimen: PATH Cytology Misc. fluid; Body Fluid  Result Value Ref Range Status   Specimen Description   Final    PERICARDIAL Performed at [facility], [address] [place], [place] [address]    Special Requests   Final    PERICARDIAL Performed at [facility], [address] [place], [place] [address]    Gram Stain   Final    FEW WBC PRESENT, PREDOMINANTLY MONONUCLEAR NO ORGANISMS SEEN    Culture   Final    NO GROWTH 2 DAYS Performed at [facility], [address] [place], [place] [address]    Report Status PENDING  Incomplete  MRSA Next Gen by PCR, Nasal     Status: None   Collection Time: 12/26/23 10:50 AM   Specimen: Nasal Mucosa; Nasal Swab  Result Value Ref Range Status   MRSA by PCR Next Gen NOT DETECTED NOT DETECTED Final    Comment: (NOTE) The GeneXpert MRSA Assay (FDA approved for NASAL specimens only), is one component of a comprehensive  MRSA colonization surveillance program. It is not intended to diagnose MRSA infection nor to guide or monitor treatment for MRSA infections. Test performance is not FDA approved in patients less than 53 years old. Performed at [facility], [address] [place], [place] [address]          Radiology Studies: CT ABDOMEN PELVIS WO CONTRAST Result Date: 12/27/2023 CLINICAL DATA:  Nonlocalized abdominal pain. Hypertension. Below-the-knee amputation. Morbid obesity. Stage III ovarian cancer on chemotherapy. Pericardial effusion. * Tracking Code: BO * EXAM: CT ABDOMEN AND PELVIS WITHOUT CONTRAST TECHNIQUE: Multidetector CT imaging of the abdomen and pelvis was performed following the standard protocol without IV contrast. RADIATION DOSE REDUCTION: This exam was performed according to the departmental dose-optimization program which  includes automated exposure control, adjustment of the mA and/or kV according to patient size and/or use of iterative reconstruction technique. COMPARISON:  12/24/2023 FINDINGS: Lower chest: Bibasilar atelectasis. Decrease in tiny pericardial effusion. Increase in small right and new small left pleural effusions. Compressive bibasilar atelectasis. Hepatobiliary: Moderate hepatic steatosis. Hepatomegaly at 18.6 cm craniocaudal. Cholecystectomy, without biliary ductal dilatation. Pancreas: Normal, without mass or ductal dilatation. Spleen: Normal in size, without focal abnormality. Adrenals/Urinary Tract: Normal right adrenal gland. Fat density left adrenal nodule of 2.7 cm is consistent with a benign myelolipoma. Punctate left renal collecting system calculus. Right renal vascular calcification. No hydronephrosis. No hydroureter or ureteric calculi. No bladder calculi. Stomach/Bowel: Normal stomach, without wall thickening. Dense contrast within the colon, which is normal in caliber. Normal terminal ileum. The appendix measures up to 7 mm, without surrounding  inflammation on image 67/5. Normal small bowel. Vascular/Lymphatic: Advanced aortic and branch vessel atherosclerosis. Small abdominal retroperitoneal nodes, none pathologic by size criteria. Reproductive: Hysterectomy.  No adnexal mass. Other: No significant free fluid. Mild pelvic floor laxity. No evidence of omental or peritoneal disease. Mild anasarca. Musculoskeletal: Lumbosacral spondylosis. IMPRESSION: 1. Normal appendix, without acute process in the abdomen or pelvis. 2. No evidence of metastatic disease, status post hysterectomy. 3. Decreased tiny pericardial effusion. Increased right and new left pleural effusions, both small. 4. Left nephrolithiasis 5. Hepatic steatosis 6.  Aortic Atherosclerosis (ICD10-I70.0). Electronically Signed   By: Rockey Kilts M.D.   On: 12/27/2023 15:16   ECHOCARDIOGRAM LIMITED Result Date: 12/26/2023    ECHOCARDIOGRAM LIMITED REPORT   Patient Name:   DENNETTE FAULCONER Date of Exam: 12/26/2023 Medical Rec #:  [id number]           Height:       69.0 in Accession #:    [id number]          Weight:       288.0 lb Date of Birth:  April 29, 1965          BSA:          2.412 m Patient Age:    58 years            BP:           145/90 mmHg Patient Gender: F                   HR:           70 bpm. Exam Location:  [facility] Procedure: Limited Echo, Color Doppler and Saline Contrast Bubble Study (Both            Spectral and Color Flow Doppler were utilized during procedure). Indications:     Pericardial effusion I31.3  History:         Patient has prior history of Echocardiogram examinations, most                  recent 12/25/2023.  Sonographer:     Ashley McNeely-Sloane Referring Phys:  [id number] CARALYN HUDSON Diagnosing Phys: Deatrice Cage MD IMPRESSIONS  1. Left ventricular ejection fraction, by estimation, is 55 to 60%. The left ventricle has normal function. The left ventricle has no regional wall motion abnormalities. Left ventricular diastolic function could not be evaluated.  2. Right  ventricular systolic function is normal. The right ventricular size is normal.  3. Large pericardial effusion initially noted. post pericardiocentesis, effusion is small with no tamponade. Agitated saline was injected via the catheter which confirmed position in pericardial space.  4. The mitral  valve was not well visualized. No evidence of mitral valve regurgitation. No evidence of mitral stenosis.  5. The aortic valve was not well visualized. Aortic valve regurgitation is not visualized. No aortic stenosis is present. FINDINGS  Left Ventricle: Left ventricular ejection fraction, by estimation, is 55 to 60%. The left ventricle has normal function. The left ventricle has no regional wall motion abnormalities. The left ventricular internal cavity size was normal in size. There is  no left ventricular hypertrophy. Left ventricular diastolic function could not be evaluated. Right Ventricle: The right ventricular size is normal. No increase in right ventricular wall thickness. Right ventricular systolic function is normal. Left Atrium: Left atrial size was normal in size. Right Atrium: Right atrial size was normal in size. Pericardium: A large pericardial effusion is present. There is no evidence of cardiac tamponade. Mitral Valve: The mitral valve was not well visualized. Mild mitral annular calcification. No evidence of mitral valve stenosis. Tricuspid Valve: The tricuspid valve is not well visualized. Tricuspid valve regurgitation is not demonstrated. No evidence of tricuspid stenosis. Aortic Valve: The aortic valve was not well visualized. Aortic valve regurgitation is not visualized. No aortic stenosis is present. Pulmonic Valve: The pulmonic valve was not well visualized. Pulmonic valve regurgitation is not visualized. No evidence of pulmonic stenosis. Aorta: The aortic root is normal in size and structure. IAS/Shunts: No atrial level shunt detected by color flow Doppler. Agitated saline contrast was given  intravenously to evaluate for intracardiac shunting. Deatrice Cage MD Electronically signed by Deatrice Cage MD Signature Date/Time: 12/26/2023/2:38:28 PM    Final    CARDIAC CATHETERIZATION Result Date: 12/26/2023 Successful pericardiocentesis via the subxiphoid area.  A total of 940 mL of bloody fluid was removed. Recommendations: Fluid was sent to the lab for analysis including cytology. Continue drainage to gravity and if output is less than 50 mL in 24 hours and echocardiogram shows resolution of pericardial effusion, the drain can be removed. I ordered her repeat limited echocardiogram to be done tomorrow. Recommend holding anticoagulation for at least 24 hours given bloody effusion.        Scheduled Meds:  apixaban   5 mg Oral BID   Chlorhexidine  Gluconate Cloth  6 each Topical Daily   insulin  aspart  0-15 Units Subcutaneous TID WC   insulin  aspart  0-5 Units Subcutaneous QHS   pantoprazole   40 mg Oral Daily   senna-docusate  1 tablet Oral BID   simethicone   80 mg Oral QID   venlafaxine  XR  150 mg Oral Q breakfast   Continuous Infusions:  sodium chloride  5 mL/hr at 12/28/23 9177   amiodarone  30 mg/hr (12/28/23 0858)   piperacillin -tazobactam (ZOSYN )  IV 3.375 g (12/28/23 0825)   promethazine  (PHENERGAN ) injection (IM or IVPB) Stopped (12/27/23 2216)     LOS: 3 days      Alban Pepper, MD [facility]   If 7PM-7AM, please contact night-coverage  12/28/2023, 9:24 AM    "

## 2023-12-28 NOTE — Plan of Care (Signed)
" °  Problem: Education: Goal: Ability to describe self-care measures that may prevent or decrease complications (Diabetes Survival Skills Education) will improve Outcome: Progressing   Problem: Coping: Goal: Ability to adjust to condition or change in health will improve Outcome: Progressing   Problem: Fluid Volume: Goal: Ability to maintain a balanced intake and output will improve Outcome: Progressing   Problem: Health Behavior/Discharge Planning: Goal: Ability to identify and utilize available resources and services will improve Outcome: Progressing Goal: Ability to manage health-related needs will improve Outcome: Progressing   Problem: Metabolic: Goal: Ability to maintain appropriate glucose levels will improve Outcome: Progressing   Problem: Nutritional: Goal: Maintenance of adequate nutrition will improve Outcome: Progressing Goal: Progress toward achieving an optimal weight will improve Outcome: Progressing   Problem: Nutritional: Goal: Maintenance of adequate nutrition will improve Outcome: Progressing Goal: Progress toward achieving an optimal weight will improve Outcome: Progressing   Problem: Nutritional: Goal: Maintenance of adequate nutrition will improve Outcome: Progressing Goal: Progress toward achieving an optimal weight will improve Outcome: Progressing   Problem: Nutritional: Goal: Maintenance of adequate nutrition will improve Outcome: Progressing Goal: Progress toward achieving an optimal weight will improve Outcome: Progressing   Problem: Skin Integrity: Goal: Risk for impaired skin integrity will decrease Outcome: Progressing   Problem: Tissue Perfusion: Goal: Adequacy of tissue perfusion will improve Outcome: Progressing   Problem: Clinical Measurements: Goal: Ability to maintain clinical measurements within normal limits will improve Outcome: Progressing Goal: Will remain free from infection Outcome: Progressing Goal: Diagnostic test  results will improve Outcome: Progressing Goal: Respiratory complications will improve Outcome: Progressing Goal: Cardiovascular complication will be avoided Outcome: Progressing   Problem: Activity: Goal: Risk for activity intolerance will decrease Outcome: Progressing   Problem: Nutrition: Goal: Adequate nutrition will be maintained Outcome: Progressing   Problem: Coping: Goal: Level of anxiety will decrease Outcome: Progressing   Problem: Elimination: Goal: Will not experience complications related to bowel motility Outcome: Progressing Goal: Will not experience complications related to urinary retention Outcome: Progressing   Problem: Pain Managment: Goal: General experience of comfort will improve and/or be controlled Outcome: Progressing   Problem: Safety: Goal: Ability to remain free from injury will improve Outcome: Progressing   Problem: Skin Integrity: Goal: Risk for impaired skin integrity will decrease Outcome: Progressing   Problem: Education: Goal: Knowledge of the prescribed therapeutic regimen will improve Outcome: Progressing   Problem: Activity: Goal: Ability to implement measures to reduce episodes of fatigue will improve Outcome: Progressing   Problem: Coping: Goal: Ability to identify and develop effective coping behavior will improve Outcome: Progressing   Problem: Nutritional: Goal: Maintenance of adequate nutrition will improve Outcome: Progressing   Problem: Education: Goal: Ability to demonstrate management of disease process will improve Outcome: Progressing Goal: Ability to verbalize understanding of medication therapies will improve Outcome: Progressing   Problem: Activity: Goal: Capacity to carry out activities will improve Outcome: Progressing   Problem: Cardiac: Goal: Ability to achieve and maintain adequate cardiopulmonary perfusion will improve Outcome: Progressing   Problem: Activity: Goal: Ability to tolerate  increased activity will improve Outcome: Progressing Goal: Will verbalize the importance of balancing activity with adequate rest periods Outcome: Progressing   Problem: Respiratory: Goal: Ability to maintain a clear airway will improve Outcome: Progressing Goal: Levels of oxygenation will improve Outcome: Progressing Goal: Ability to maintain adequate ventilation will improve Outcome: Progressing   "

## 2023-12-28 NOTE — Care Management Important Message (Signed)
 Important Message  Patient Details  Name: Nancy [name] MRN: [id number] Date of Birth: October 02, 1965   Important Message Given:  Yes - Medicare IM     Rojelio [name] 12/28/2023, 1:24 PM

## 2023-12-29 ENCOUNTER — Telehealth: Payer: Self-pay | Admitting: *Deleted

## 2023-12-29 DIAGNOSIS — I3139 Other pericardial effusion (noninflammatory): Secondary | ICD-10-CM | POA: Diagnosis not present

## 2023-12-29 LAB — BASIC METABOLIC PANEL WITH GFR
Anion gap: 7 (ref 5–15)
BUN: 15 mg/dL (ref 6–20)
CO2: 31 mmol/L (ref 22–32)
Calcium: 8.3 mg/dL — ABNORMAL LOW (ref 8.9–10.3)
Chloride: 100 mmol/L (ref 98–111)
Creatinine, Ser: 0.9 mg/dL (ref 0.44–1.00)
GFR, Estimated: >60 mL/min
Glucose, Bld: 202 mg/dL — ABNORMAL HIGH (ref 70–99)
Potassium: 3.8 mmol/L (ref 3.5–5.1)
Sodium: 138 mmol/L (ref 135–145)

## 2023-12-29 LAB — GLUCOSE, CAPILLARY
Glucose-Capillary: 207 mg/dL — ABNORMAL HIGH (ref 70–99)
Glucose-Capillary: 206 mg/dL — ABNORMAL HIGH (ref 70–99)
Glucose-Capillary: 185 mg/dL — ABNORMAL HIGH (ref 70–99)
Glucose-Capillary: 176 mg/dL — ABNORMAL HIGH (ref 70–99)

## 2023-12-29 LAB — LD, BODY FLUID (OTHER): LD, Body Fluid: 2860 IU/L

## 2023-12-29 LAB — BODY FLUID CULTURE W GRAM STAIN: Culture: NO GROWTH

## 2023-12-29 LAB — CBC
HCT: 29.5 % — ABNORMAL LOW (ref 36.0–46.0)
Hemoglobin: 9.1 g/dL — ABNORMAL LOW (ref 12.0–15.0)
MCH: 31.1 pg (ref 26.0–34.0)
MCHC: 30.8 g/dL (ref 30.0–36.0)
MCV: 100.7 fL — ABNORMAL HIGH (ref 80.0–100.0)
Platelets: 304 K/uL (ref 150–400)
RBC: 2.93 MIL/uL — ABNORMAL LOW (ref 3.87–5.11)
RDW: 15.9 % — ABNORMAL HIGH (ref 11.5–15.5)
WBC: 6.5 K/uL (ref 4.0–10.5)
nRBC: 0 % (ref 0.0–0.2)

## 2023-12-29 LAB — GLUCOSE, BODY FLUID OTHER: Glucose, Body Fluid Other: 165 mg/dL

## 2023-12-29 LAB — FOLATE: Folate: 16.3 ng/mL

## 2023-12-29 LAB — PROTEIN, BODY FLUID (OTHER): Total Protein, Body Fluid Other: 5.1 g/dL

## 2023-12-29 LAB — VITAMIN B12: Vitamin B-12: 1313 pg/mL — ABNORMAL HIGH (ref 180–914)

## 2023-12-29 MED ORDER — INSULIN ASPART 100 UNIT/ML IJ SOLN
0.0000 [IU] | Freq: Three times a day (TID) | INTRAMUSCULAR | Status: DC
  Administered 2023-12-29 – 2023-12-30 (×2): 2 [IU] via SUBCUTANEOUS
  Filled 2023-12-29 (×2): qty 2

## 2023-12-29 MED ORDER — INSULIN ASPART 100 UNIT/ML IJ SOLN: 0.0000 [IU] | Freq: Every day | INTRAMUSCULAR | Status: DC

## 2023-12-29 MED ORDER — LISINOPRIL 5 MG PO TABS
5.0000 mg | ORAL_TABLET | Freq: Every day | ORAL | Status: DC
  Administered 2023-12-29 – 2023-12-30 (×2): 5 mg via ORAL
  Filled 2023-12-29 (×2): qty 1

## 2023-12-29 MED ORDER — INSULIN ASPART PROT & ASPART (70-30 MIX) 100 UNIT/ML ~~LOC~~ SUSP
15.0000 [IU] | Freq: Two times a day (BID) | SUBCUTANEOUS | Status: DC
  Administered 2023-12-29 – 2023-12-30 (×2): 15 [IU] via SUBCUTANEOUS
  Filled 2023-12-29 (×2): qty 10

## 2023-12-29 MED ORDER — AMIODARONE HCL 200 MG PO TABS: 200.0000 mg | ORAL_TABLET | Freq: Every day | ORAL | Status: DC

## 2023-12-29 MED ORDER — MONTELUKAST SODIUM 10 MG PO TABS
10.0000 mg | ORAL_TABLET | Freq: Every day | ORAL | Status: DC
  Administered 2023-12-29: 10 mg via ORAL
  Filled 2023-12-29: qty 1

## 2023-12-29 MED ORDER — AMIODARONE HCL 200 MG PO TABS
400.0000 mg | ORAL_TABLET | Freq: Two times a day (BID) | ORAL | Status: DC
  Administered 2023-12-29 – 2023-12-30 (×2): 400 mg via ORAL
  Filled 2023-12-29 (×2): qty 2

## 2023-12-29 NOTE — Progress Notes (Signed)
 " [facility] CLINIC CARDIOLOGY PROGRESS NOTE       Patient ID: Nancy [name] MRN: [id number] DOB/AGE: 08-31-1965 58 y.o.  Admit date: 12/24/2023 Referring Physician Dr. Delayne Solian Primary Physician Orlean Alan HERO, FNP  Primary Cardiologist none Reason for Consultation pericardial effusion  HPI: Nancy [name] is a 58 y.o. female  with a past medical history of HTN, DM , left BKA, asthma, morbid obesity, NICM (EF 70% 12/08/2023), stage 3 ovarian CA on maintenance chemotherapy, currently on hold due to chemotherapy induced diarrhea who presented to the ED on 12/24/2023 for abnormal CT results which showed large pericardial effusion. Cardiology was consulted for further evaluation.   Interval history: - Patient seen and examined this morning, sitting upright in hospital bed. - Reports she feels overall better with improvement in her SOB and abdominal discomfort. - Remaining in NSR on tele.  - Minimal output from pericardial drain.  Review of systems complete and found to be negative unless listed above    Past Medical History:  Diagnosis Date   Abnormal cardiovascular stress test 09/21/2018   Formatting of this note might be different from the original. Lexiscan Myoview 09/16/2018 revealed mild anterior ischemia   Adverse effect of motion 05/10/2014   AKI (acute kidney injury) 10/22/2022   Anxiety    Arthritis    r knee   Asthma    Breast cyst 05/10/2014   CAD (coronary artery disease)    a.) LHC 06/04/2009 at [facility]; non-obstructive CAD. b.) CTA with FFR 10/08/2018: extensive mixed plaque proximal to mid LAD (51-69%); Coronary Ca score 217; FFR 0.71 dPDA, 0.86 mLAD, 0.87 dLCx.   CCF (congestive cardiac failure) (HCC) 06/06/2008   a.) 30% EF. b.) TTE 06/03/2011: EF >55%; triv MR. c.) TTE 04/27/2018: EF 55%; mild LVH; triv PR, mild MR/TR; G1DD.   Cellulitis of left lower extremity 09/18/2022   Chest pain with high risk for cardiac etiology 07/02/2016   Chronic use of  opiate drug for therapeutic purpose 02/13/2022   Formatting of this note might be different from the original. [facility] Pain Contract signed on 04/18/16 & updated 03/04/17; UDS done on 04/18/16.   Complication of anesthesia    Diabetic foot infection (HCC) 03/13/2015   Diabetic foot ulcer (HCC) 02/08/2021   Diabetic ulcer of left heel associated with type 2 diabetes mellitus (HCC) 02/08/2021   Eczema    Family history of adverse reaction to anesthesia    a.) PONV in mother and grandmother   Gas gangrene (HCC) 09/18/2022   GERD (gastroesophageal reflux disease) 05/10/2014   History of kidney stones    History of MSSA bacteremia due to osteomyelitis L foot 09/18/22 10/22/2022   HLD (hyperlipidemia)    Hyponatremia 03/13/2015   Migraines    Motion sickness    MSSA bacteremia 09/20/2022   Osteomyelitis (HCC) 09/18/2022   Other acute osteomyelitis, left ankle and foot (HCC) 02/13/2022   Panic attacks    Peripheral edema 04/27/2018   Pneumonia    PONV (postoperative nausea and vomiting)    Sepsis secondary to diabetic foot infection 03/13/2015   Sepsis secondary to diabetic foot infection 03/13/2015   T2DM (type 2 diabetes mellitus) (HCC)    Takotsubo cardiomyopathy / transient apical balooning syndrome / stress-induced cardiomyopathy 05/10/2014   Unspecified essential hypertension     Past Surgical History:  Procedure Laterality Date   AMPUTATION Left 09/19/2022   Procedure: AMPUTATION BELOW KNEE;  Surgeon: Jama Cordella MATSU, MD;  Location: [facility] ORS;  Service: Vascular;  Laterality:  Left;   AMPUTATION Left 09/24/2022   Procedure: AMPUTATION BELOW KNEE WITH WOUND CLOSURE;  Surgeon: Jama Cordella MATSU, MD;  Location: [facility];  Service: Vascular;  Laterality: Left;   AMPUTATION TOE Left 03/16/2015   Procedure: left fifth toe amputation with incision and drainage;  Surgeon: Eva Gay, DPM;  Location: [facility] OR[facility];  Service: Podiatry;  Laterality: Left;   APPLICATION OF WOUND VAC Left 02/14/2021    Procedure: APPLICATION OF WOUND VAC;  Surgeon: Gay Eva, DPM;  Location: [facility];  Service: Podiatry;  Laterality: Left;   APPLICATION OF WOUND VAC Left 04/16/2021   Procedure: APPLICATION OF WOUND VAC;  Surgeon: Lowery Estefana RAMAN, DO;  Location: [facility];  Service: Plastics;  Laterality: Left;   CARDIAC CATHETERIZATION Left 06/04/2009   Procedure: CARDIAC CATHETERIZATION; Location: [facility]   CHOLECYSTECTOMY     DEBRIDEMENT AND CLOSURE WOUND Left 04/16/2021   Procedure: DEBRIDEMENT AND CLOSURE WOUND;  Surgeon: Lowery Estefana RAMAN, DO;  Location: [facility];  Service: Plastics;  Laterality: Left;  1 hour   IR IMAGING GUIDED PORT INSERTION  11/18/2022   IRRIGATION AND DEBRIDEMENT FOOT Left 02/08/2021   Procedure: IRRIGATION AND DEBRIDEMENT FOOT - LFT HEEL ULCER;  Surgeon: Gay Eva, DPM;  Location: [facility];  Service: Podiatry;  Laterality: Left;   IRRIGATION AND DEBRIDEMENT FOOT Left 02/14/2021   Procedure: IRRIGATION AND DEBRIDEMENT LEFT HEEL;  Surgeon: Gay Eva, DPM;  Location: [facility];  Service: Podiatry;  Laterality: Left;   kidney stone removal     KNEE ARTHROSCOPY W/ MENISCAL REPAIR     NOSE SURGERY  01/07/1989   due to fracture   PERICARDIOCENTESIS N/A 12/26/2023   Procedure: PERICARDIOCENTESIS;  Surgeon: Darron Deatrice LABOR, MD;  Location: [facility];  Service: Cardiovascular;  Laterality: N/A;   TEE WITHOUT CARDIOVERSION N/A 09/26/2022   Procedure: TRANSESOPHAGEAL ECHOCARDIOGRAM;  Surgeon: Dewane Shiner, DO;  Location: [facility];  Service: Cardiovascular;  Laterality: N/A;   TOE AMPUTATION     second toe   TONSILLECTOMY     TOTAL KNEE ARTHROPLASTY  01/11/2011   Procedure: TOTAL KNEE ARTHROPLASTY;  Surgeon: Norleen LITTIE Gavel;  Location: [facility];  Service: Orthopedics;  Laterality: Right;  COMPUTER ASSISTED TOTAL KNEE REPLACEMENT    Medications Prior to Admission  Medication Sig Dispense Refill Last Dose/Taking    acidophilus (RISAQUAD) CAPS capsule Take 1 capsule by mouth daily.   12/24/2023 Morning   carvedilol  (COREG ) 6.25 MG tablet TAKE 1 TABLET BY MOUTH TWICE DAILY 180 tablet 1 12/24/2023 Morning   colchicine  0.6 MG tablet Take 0.6 mg by mouth 2 (two) times daily.   12/24/2023 Morning   desvenlafaxine  (PRISTIQ ) 100 MG 24 hr tablet Take 1 tablet (100 mg total) by mouth daily. 90 tablet 3 12/23/2023 Evening   insulin  NPH-regular Human (70-30) 100 UNIT/ML injection Inject 70 Units into the skin 2 (two) times daily with a meal. 50 mL 10 12/24/2023   montelukast  (SINGULAIR ) 10 MG tablet Take 1 tablet (10 mg total) by mouth daily. 90 tablet 1 12/23/2023 Evening   Multiple Vitamin (MULTIVITAMIN WITH MINERALS) TABS tablet Take 1 tablet by mouth daily. 30 tablet 2 12/24/2023 Morning   pantoprazole  (PROTONIX ) 40 MG tablet Take 1 tablet (40 mg total) by mouth daily. 90 tablet 3 12/23/2023 Evening   albuterol  (PROVENTIL  HFA;VENTOLIN  HFA) 108 (90 Base) MCG/ACT inhaler Inhale 2 puffs into the lungs every 4 (four) hours as needed for wheezing or shortness of breath. Reported on 03/29/2015 (Patient  not taking: No sig reported) 1 Inhaler 3 Not Taking   albuterol  (PROVENTIL ) (2.5 MG/3ML) 0.083% nebulizer solution Take 2.5 mg by nebulization as needed. (Patient not taking: No sig reported)   Not Taking   ALPRAZolam  (XANAX ) 0.5 MG tablet Take 0.5-1 tablets (0.25-0.5 mg total) by mouth 2 (two) times daily as needed for anxiety. Take 1/2 to 1 tablet by mouth as needed for panic attacks (Patient not taking: No sig reported) 60 tablet 2 Not Taking   aspirin  EC 81 MG tablet Take 81 mg by mouth daily. (Patient not taking: No sig reported)   Not Taking   Continuous Glucose Sensor (FREESTYLE LIBRE 2 SENSOR) MISC 1 each by Does not apply route every 14 (fourteen) days. (Patient not taking: No sig reported) 2 each 3 Not Taking   diphenoxylate -atropine  (LOMOTIL ) 2.5-0.025 MG tablet Take 1 tablet by mouth 4 (four) times daily as needed for  diarrhea or loose stools. (Patient not taking: No sig reported) 30 tablet 0 Not Taking   Ensure Max Protein (ENSURE MAX PROTEIN) LIQD Take 330 mLs (11 oz total) by mouth 2 (two) times daily.      fenofibrate  54 MG tablet TAKE 1 TABLET BY MOUTH EVERY DAY (Patient not taking: No sig reported) 30 tablet 11 Not Taking   gabapentin  (NEURONTIN ) 300 MG capsule TAKE 1 CAPSULE BY MOUTH EVERY NIGHT AT BEDTIME (Patient not taking: No sig reported) 60 capsule 0 Not Taking   ibuprofen (ADVIL) 800 MG tablet TAKE 1 TABLET BY MOUTH EVERY 8 HOURS AS NEEDED (Patient not taking: No sig reported) 90 tablet 3 Not Taking   lidocaine  4 % Place 1 patch onto the skin daily as needed (for pain). (Patient not taking: Reported on 12/24/2023)   Not Taking   lidocaine -prilocaine  (EMLA ) cream Apply to affected area once (Patient not taking: Reported on 12/24/2023) 30 g 3 Not Taking   lisinopril  (ZESTRIL ) 10 MG tablet Take 1 tablet (10 mg total) by mouth every morning AND 0.5 tablets (5 mg total) at bedtime. (Patient not taking: No sig reported) 145 tablet 3 Not Taking   lisinopril  (ZESTRIL ) 5 MG tablet TAKE 1 TABLET BY MOUTH TWICE DAILY (Patient not taking: No sig reported) 180 tablet 1 Not Taking   metFORMIN  (GLUCOPHAGE ) 500 MG tablet TAKE 2 TABLET BY MOUTH IN THE MORNING AND 3 TABLET BY MOUTH IN THE EVENING (Patient not taking: No sig reported) 450 tablet 1 Not Taking   methocarbamol  (ROBAXIN ) 500 MG tablet Take 1 tablet (500 mg total) by mouth every 8 (eight) hours as needed for muscle spasms. (Patient not taking: No sig reported) 30 tablet 0 Not Taking   mirtazapine  (REMERON ) 7.5 MG tablet TAKE 1 TABLET BY MOUTH EVERY NIGHT AT BEDTIME (Patient not taking: No sig reported) 90 tablet 2 Not Taking   naloxone  (NARCAN ) nasal spray 4 mg/0.1 mL SPRAY 1 SPRAY INTO ONE NOSTRIL AS DIRECTED FOR OPIOID OVERDOSE (TURN PERSON ON SIDE AFTER DOSE. IF NO RESPONSE IN 2-3 MINUTES OR PERSON RESPONDS BUT RELAPSES, REPEAT USING A NEW SPRAY DEVICE AND  SPRAY INTO THE OTHER NOSTRIL. CALL 911 AFTER USE.) * EMERGENCY USE ONLY * (Patient not taking: No sig reported) 1 each 0 Not Taking   nystatin  (MYCOSTATIN /NYSTOP ) powder APPLY TOPICALLY TWICE DAILY AS NEEDED (Patient not taking: No sig reported) 30 g 3 Not Taking   nystatin  ointment (MYCOSTATIN ) Apply 1 Application topically 2 (two) times daily. (Patient not taking: No sig reported) 30 g 0 Not Taking   olaparib  (LYNPARZA ) 100  MG tablet Take 1 tablet (100 mg total) by mouth 2 (two) times daily. Swallow whole. May take with food to decrease nausea and vomiting. Take with 150mg  tablet (Patient not taking: No sig reported) 60 tablet 0 Not Taking   olaparib  (LYNPARZA ) 150 MG tablet Take 1 tablet (150 mg total) by mouth 2 (two) times daily. Swallow whole. May take with food to decrease nausea and vomiting. Take with 100 mg tablet (Patient not taking: No sig reported) 60 tablet 0 Not Taking   ondansetron  (ZOFRAN ) 8 MG tablet Take 1 tablet (8 mg total) by mouth every 8 (eight) hours as needed for nausea or vomiting. Start on the third day after carboplatin . (Patient not taking: No sig reported) 30 tablet 1 Not Taking   ondansetron  (ZOFRAN -ODT) 8 MG disintegrating tablet Take 1 tablet (8 mg total) by mouth every 8 (eight) hours as needed for nausea or vomiting. (Patient not taking: No sig reported) 90 tablet 3 Not Taking   pravastatin  (PRAVACHOL ) 40 MG tablet TAKE 1 TABLET BY MOUTH EVERY DAY (Patient not taking: No sig reported) 30 tablet 5 Not Taking   prochlorperazine  (COMPAZINE ) 10 MG tablet Take 1 tablet (10 mg total) by mouth every 6 (six) hours as needed for nausea or vomiting. (Patient not taking: No sig reported) 90 tablet 1 Not Taking   tirzepatide  (MOUNJARO ) 7.5 MG/0.5ML Pen Inject 7.5 mg into the skin once a week. (Patient not taking: No sig reported) 2 mL 4 Not Taking   Ubrogepant  (UBRELVY ) 100 MG TABS Take 1 tablet (100 mg total) by mouth as needed (migraine). (Patient not taking: No sig reported) 12  tablet 1 Not Taking   zinc  sulfate 220 (50 Zn) MG capsule Take 1 capsule (220 mg total) by mouth daily. (Patient not taking: No sig reported) 30 capsule 0 Not Taking   Social History   Socioeconomic History   Marital status: Married    Spouse name: Not on file   Number of children: Not on file   Years of education: Not on file   Highest education level: Not on file  Occupational History   Not on file  Tobacco Use   Smoking status: Former    Current packs/day: 0.00    Types: Cigarettes    Quit date: 03/12/1985    Years since quitting: 38.8   Smokeless tobacco: Never  Vaping Use   Vaping status: Never Used  Substance and Sexual Activity   Alcohol use: No   Drug use: No   Sexual activity: Yes    Birth control/protection: Post-menopausal  Other Topics Concern   Not on file  Social History Narrative   Wife, Barnie, at bedside. One indoor pet, dog.   Social Drivers of Health   Tobacco Use: Medium Risk (12/24/2023)   Patient History    Smoking Tobacco Use: Former    Smokeless Tobacco Use: Never    Passive Exposure: Not on file  Financial Resource Strain: Medium Risk (12/01/2023)   Received from [facility]   Overall Financial Resource Strain (CARDIA)    Difficulty of Paying Living Expenses: Somewhat hard  Food Insecurity: No Food Insecurity (12/26/2023)   Epic    Worried About Radiation Protection Practitioner of Food in the Last Year: Never true    Ran Out of Food in the Last Year: Never true  Transportation Needs: No Transportation Needs (12/26/2023)   Epic    Lack of Transportation (Medical): No    Lack of Transportation (Non-Medical): No  Recent Concern: Transportation  Needs - Unmet Transportation Needs (12/01/2023)   Received from [facility] - Transportation    In the past 12 months, has lack of transportation kept you from medical appointments or from getting medications?: Yes    Lack of Transportation (Non-Medical): Yes  Physical  Activity: Not on file  Stress: Not on file  Social Connections: Not on file  Intimate Partner Violence: Not At Risk (12/27/2023)   Epic    Fear of Current or Ex-Partner: No    Emotionally Abused: No    Physically Abused: No    Sexually Abused: No  Depression (PHQ2-9): Low Risk (11/04/2023)   Depression (PHQ2-9)    PHQ-2 Score: 1  Alcohol Screen: Low Risk (12/24/2023)   Alcohol Screen    Last Alcohol Screening Score (AUDIT): 0  Housing: Low Risk (12/27/2023)   Epic    Unable to Pay for Housing in the Last Year: No    Number of Times Moved in the Last Year: 0    Homeless in the Last Year: No  Utilities: Not At Risk (12/27/2023)   Epic    Threatened with loss of utilities: No  Health Literacy: Not on file    Family History  Problem Relation Age of Onset   Transient ischemic attack Mother    Asthma Mother    Hypertension Mother    Lung cancer Father 61   Hypertension Father    Asthma Sister    Anxiety disorder Sister    Asthma Sister    Hypertension Brother    Ovarian cancer Maternal Aunt 71   Melanoma Maternal Uncle        dx 3s   Lung cancer Paternal Aunt 61   Stomach cancer Maternal Grandmother    Colon cancer Maternal Grandmother    Lymphoma Maternal Grandmother    Melanoma Maternal Grandfather 72       sun exposure   Lung cancer Paternal Grandmother 36   Kidney cancer Paternal Grandfather        dx early   Crohn's disease Other    GER disease Other    Breast cancer Neg Hx      Vitals:   12/29/23 0600 12/29/23 0700 12/29/23 0724 12/29/23 0800  BP: 130/66 (!) 143/67  (!) 140/78  Pulse: (!) 58 (!) 59    Resp: 11 10  18   Temp:   98.4 F (36.9 C)   TempSrc:   Oral   SpO2: 91% 90%    Weight:      Height:        PHYSICAL EXAM General: Ill-appearing female, well nourished, in no acute distress. HEENT: Normocephalic and atraumatic. Neck: No JVD.  Lungs: Normal respiratory effort on room air. Clear bilaterally to auscultation. No wheezes, crackles,  rhonchi.  Heart: HRRR. Normal S1 and S2 without gallops or murmurs.  Abdomen: Non-distended appearing.  Msk: Normal strength and tone for age. Extremities: Warm and well perfused. No clubbing, cyanosis.  No edema.  Neuro: Alert and oriented X 3. Psych: Answers questions appropriately.   Labs: Basic Metabolic Panel: Recent Labs    12/28/23 0429 12/29/23 0403  NA 139 138  K 3.7 3.8  CL 103 100  CO2 30 31  GLUCOSE 188* 202*  BUN 18 15  CREATININE 0.93 0.90  CALCIUM 8.2* 8.3*   Liver Function Tests: No results for input(s): AST, ALT, ALKPHOS, BILITOT, PROT, ALBUMIN in the last 72 hours.  No results for input(s): LIPASE, AMYLASE in the last 72  hours. CBC: Recent Labs    12/28/23 1547 12/29/23 0403  WBC 7.5 6.5  HGB 9.6* 9.1*  HCT 30.6* 29.5*  MCV 99.7 100.7*  PLT 321 304   Cardiac Enzymes: No results for input(s): CKTOTAL, CKMB, CKMBINDEX, TROPONINIHS in the last 72 hours. BNP: No results for input(s): BNP in the last 72 hours. D-Dimer: No results for input(s): DDIMER in the last 72 hours. Hemoglobin A1C: No results for input(s): HGBA1C in the last 72 hours. Fasting Lipid Panel: No results for input(s): CHOL, HDL, LDLCALC, TRIG, CHOLHDL, LDLDIRECT in the last 72 hours. Thyroid  Function Tests: No results for input(s): TSH, T4TOTAL, T3FREE, THYROIDAB in the last 72 hours.  Invalid input(s): FREET3 Anemia Panel: Recent Labs    12/29/23 0403  FOLATE 16.3     Radiology: ECHOCARDIOGRAM LIMITED Result Date: 12/28/2023    ECHOCARDIOGRAM LIMITED REPORT   Patient Name:   Nancy [name] Date of Exam: 12/28/2023 Medical Rec #:  [id number]           Height:       69.0 in Accession #:    [id number]          Weight:       288.0 lb Date of Birth:  07-21-1965          BSA:          2.412 m Patient Age:    58 years            BP:           151/65 mmHg Patient Gender: F                   HR:           66 bpm. Exam Location:   [facility] Procedure: 2D Echo and Cardiac Doppler (Both Spectral and Color Flow Doppler            were utilized during procedure). Indications:     Pericardial effusion I31.3  History:         Patient has prior history of Echocardiogram examinations, most                  recent 12/26/2023.  Sonographer:     Thedora Louder RDCS, FASE Referring Phys:  [id number] FLYJFFJI A ARIDA Diagnosing Phys: Denyse Bathe  Sonographer Comments: Technically difficult study due to poor echo windows. Image acquisition challenging due to patient body habitus. This study was performed with patient supine. IMPRESSIONS  1. Left ventricular ejection fraction, by estimation, is 55 to 60%. The left ventricle has normal function. Left ventricular diastolic function could not be evaluated.  2. Left atrial size was mild to moderately dilated.  3. Right atrial size was mild to moderately dilated.  4. A small pericardial effusion is present. The pericardial effusion is circumferential. There is no evidence of cardiac tamponade.  5. The mitral valve was not assessed.  6. The aortic valve was not assessed. FINDINGS  Left Ventricle: Left ventricular ejection fraction, by estimation, is 55 to 60%. The left ventricle has normal function. Left ventricular diastolic function could not be evaluated. Left Atrium: Left atrial size was mild to moderately dilated. Right Atrium: Right atrial size was mild to moderately dilated. Pericardium: A small pericardial effusion is present. The pericardial effusion is circumferential. There is no evidence of cardiac tamponade. Mitral Valve: The mitral valve was not assessed. Tricuspid Valve: The tricuspid valve is not assessed. Aortic Valve: The aortic valve was not assessed. Additional Comments:  Spectral Doppler performed.  LEFT VENTRICLE PLAX 2D LVIDd:         4.40 cm LVIDs:         3.10 cm LV PW:         1.30 cm LV IVS:        1.30 cm  LEFT ATRIUM         Index LA diam:    3.90 cm 1.62 cm/m Denyse Bathe Electronically  signed by Denyse Bathe Signature Date/Time: 12/28/2023/10:51:10 AM    Final    CT ABDOMEN PELVIS WO CONTRAST Result Date: 12/27/2023 CLINICAL DATA:  Nonlocalized abdominal pain. Hypertension. Below-the-knee amputation. Morbid obesity. Stage III ovarian cancer on chemotherapy. Pericardial effusion. * Tracking Code: BO * EXAM: CT ABDOMEN AND PELVIS WITHOUT CONTRAST TECHNIQUE: Multidetector CT imaging of the abdomen and pelvis was performed following the standard protocol without IV contrast. RADIATION DOSE REDUCTION: This exam was performed according to the departmental dose-optimization program which includes automated exposure control, adjustment of the mA and/or kV according to patient size and/or use of iterative reconstruction technique. COMPARISON:  12/24/2023 FINDINGS: Lower chest: Bibasilar atelectasis. Decrease in tiny pericardial effusion. Increase in small right and new small left pleural effusions. Compressive bibasilar atelectasis. Hepatobiliary: Moderate hepatic steatosis. Hepatomegaly at 18.6 cm craniocaudal. Cholecystectomy, without biliary ductal dilatation. Pancreas: Normal, without mass or ductal dilatation. Spleen: Normal in size, without focal abnormality. Adrenals/Urinary Tract: Normal right adrenal gland. Fat density left adrenal nodule of 2.7 cm is consistent with a benign myelolipoma. Punctate left renal collecting system calculus. Right renal vascular calcification. No hydronephrosis. No hydroureter or ureteric calculi. No bladder calculi. Stomach/Bowel: Normal stomach, without wall thickening. Dense contrast within the colon, which is normal in caliber. Normal terminal ileum. The appendix measures up to 7 mm, without surrounding inflammation on image 67/5. Normal small bowel. Vascular/Lymphatic: Advanced aortic and branch vessel atherosclerosis. Small abdominal retroperitoneal nodes, none pathologic by size criteria. Reproductive: Hysterectomy.  No adnexal mass. Other: No significant  free fluid. Mild pelvic floor laxity. No evidence of omental or peritoneal disease. Mild anasarca. Musculoskeletal: Lumbosacral spondylosis. IMPRESSION: 1. Normal appendix, without acute process in the abdomen or pelvis. 2. No evidence of metastatic disease, status post hysterectomy. 3. Decreased tiny pericardial effusion. Increased right and new left pleural effusions, both small. 4. Left nephrolithiasis 5. Hepatic steatosis 6.  Aortic Atherosclerosis (ICD10-I70.0). Electronically Signed   By: Rockey Kilts M.D.   On: 12/27/2023 15:16   ECHOCARDIOGRAM LIMITED Result Date: 12/26/2023    ECHOCARDIOGRAM LIMITED REPORT   Patient Name:   Nancy [name] Date of Exam: 12/26/2023 Medical Rec #:  [id number]           Height:       69.0 in Accession #:    [id number]          Weight:       288.0 lb Date of Birth:  1965/12/10          BSA:          2.412 m Patient Age:    58 years            BP:           145/90 mmHg Patient Gender: F                   HR:           70 bpm. Exam Location:  [facility] Procedure: Limited Echo, Color Doppler and Saline Contrast Bubble Study (Both  Spectral and Color Flow Doppler were utilized during procedure). Indications:     Pericardial effusion I31.3  History:         Patient has prior history of Echocardiogram examinations, most                  recent 12/25/2023.  Sonographer:     Ashley McNeely-Sloane Referring Phys:  [id number] Kayzen Kendzierski Diagnosing Phys: Deatrice Cage MD IMPRESSIONS  1. Left ventricular ejection fraction, by estimation, is 55 to 60%. The left ventricle has normal function. The left ventricle has no regional wall motion abnormalities. Left ventricular diastolic function could not be evaluated.  2. Right ventricular systolic function is normal. The right ventricular size is normal.  3. Large pericardial effusion initially noted. post pericardiocentesis, effusion is small with no tamponade. Agitated saline was injected via the catheter which confirmed position  in pericardial space.  4. The mitral valve was not well visualized. No evidence of mitral valve regurgitation. No evidence of mitral stenosis.  5. The aortic valve was not well visualized. Aortic valve regurgitation is not visualized. No aortic stenosis is present. FINDINGS  Left Ventricle: Left ventricular ejection fraction, by estimation, is 55 to 60%. The left ventricle has normal function. The left ventricle has no regional wall motion abnormalities. The left ventricular internal cavity size was normal in size. There is  no left ventricular hypertrophy. Left ventricular diastolic function could not be evaluated. Right Ventricle: The right ventricular size is normal. No increase in right ventricular wall thickness. Right ventricular systolic function is normal. Left Atrium: Left atrial size was normal in size. Right Atrium: Right atrial size was normal in size. Pericardium: A large pericardial effusion is present. There is no evidence of cardiac tamponade. Mitral Valve: The mitral valve was not well visualized. Mild mitral annular calcification. No evidence of mitral valve stenosis. Tricuspid Valve: The tricuspid valve is not well visualized. Tricuspid valve regurgitation is not demonstrated. No evidence of tricuspid stenosis. Aortic Valve: The aortic valve was not well visualized. Aortic valve regurgitation is not visualized. No aortic stenosis is present. Pulmonic Valve: The pulmonic valve was not well visualized. Pulmonic valve regurgitation is not visualized. No evidence of pulmonic stenosis. Aorta: The aortic root is normal in size and structure. IAS/Shunts: No atrial level shunt detected by color flow Doppler. Agitated saline contrast was given intravenously to evaluate for intracardiac shunting. Deatrice Cage MD Electronically signed by Deatrice Cage MD Signature Date/Time: 12/26/2023/2:38:28 PM    Final    CARDIAC CATHETERIZATION Result Date: 12/26/2023 Successful pericardiocentesis via the  subxiphoid area.  A total of 940 mL of bloody fluid was removed. Recommendations: Fluid was sent to the lab for analysis including cytology. Continue drainage to gravity and if output is less than 50 mL in 24 hours and echocardiogram shows resolution of pericardial effusion, the drain can be removed. I ordered her repeat limited echocardiogram to be done tomorrow. Recommend holding anticoagulation for at least 24 hours given bloody effusion.   ECHOCARDIOGRAM COMPLETE Result Date: 12/25/2023    ECHOCARDIOGRAM REPORT   Patient Name:   Nancy [name] Date of Exam: 12/25/2023 Medical Rec #:  [id number]           Height:       69.0 in Accession #:    [id number]          Weight:       288.0 lb Date of Birth:  November 26, 1965          BSA:  2.412 m Patient Age:    58 years            BP:           127/90 mmHg Patient Gender: F                   HR:           86 bpm. Exam Location:  [facility] Procedure: 2D Echo, Cardiac Doppler, Color Doppler and Intracardiac            Opacification Agent (Both Spectral and Color Flow Doppler were            utilized during procedure). Indications:     Pericardial effusion I31.3  History:         Patient has prior history of Echocardiogram examinations, most                  recent 09/22/2022. Ovarian cancer, Arrythmias:Atrial                  Fibrillation; Signs/Symptoms:Dyspnea.  Sonographer:     Ashley McNeely-Sloane Referring Phys:  [id number] Carlee Tesfaye Diagnosing Phys: Marsa Dooms MD IMPRESSIONS  1. Left ventricular ejection fraction, by estimation, is 55 to 60%. The left ventricle has normal function. The left ventricle has no regional wall motion abnormalities. Left ventricular diastolic parameters are consistent with Grade I diastolic dysfunction (impaired relaxation).  2. Right ventricular systolic function is normal. The right ventricular size is normal.  3. Large pericardial effusion. The pericardial effusion is circumferential. There is no evidence of cardiac  tamponade.  4. The mitral valve is normal in structure. Trivial mitral valve regurgitation. No evidence of mitral stenosis.  5. The aortic valve is normal in structure. Aortic valve regurgitation is not visualized. No aortic stenosis is present.  6. The inferior vena cava is normal in size with greater than 50% respiratory variability, suggesting right atrial pressure of 3 mmHg. FINDINGS  Left Ventricle: Left ventricular ejection fraction, by estimation, is 55 to 60%. The left ventricle has normal function. The left ventricle has no regional wall motion abnormalities. Definity  contrast agent was given IV to delineate the left ventricular  endocardial borders. Strain was performed and the global longitudinal strain is indeterminate. The left ventricular internal cavity size was normal in size. There is no left ventricular hypertrophy. Left ventricular diastolic parameters are consistent with Grade I diastolic dysfunction (impaired relaxation). Right Ventricle: The right ventricular size is normal. No increase in right ventricular wall thickness. Right ventricular systolic function is normal. Left Atrium: Left atrial size was normal in size. Right Atrium: Right atrial size was normal in size. Pericardium: A large pericardial effusion is present. The pericardial effusion is circumferential. There is no evidence of cardiac tamponade. Mitral Valve: The mitral valve is normal in structure. Trivial mitral valve regurgitation. No evidence of mitral valve stenosis. MV peak gradient, 3.6 mmHg. The mean mitral valve gradient is 2.0 mmHg. Tricuspid Valve: The tricuspid valve is normal in structure. Tricuspid valve regurgitation is trivial. No evidence of tricuspid stenosis. Aortic Valve: The aortic valve is normal in structure. Aortic valve regurgitation is not visualized. No aortic stenosis is present. Aortic valve mean gradient measures 3.0 mmHg. Aortic valve peak gradient measures 4.9 mmHg. Aortic valve area, by VTI measures  3.23 cm. Pulmonic Valve: The pulmonic valve was normal in structure. Pulmonic valve regurgitation is not visualized. No evidence of pulmonic stenosis. Aorta: The aortic root is normal in size and structure. Venous: The  inferior vena cava is normal in size with greater than 50% respiratory variability, suggesting right atrial pressure of 3 mmHg. IAS/Shunts: No atrial level shunt detected by color flow Doppler. Additional Comments: 3D was performed not requiring image post processing on an independent workstation and was indeterminate.  LEFT VENTRICLE PLAX 2D LVIDd:         2.90 cm     Diastology LVIDs:         2.10 cm     LV e' medial:    6.31 cm/s LV PW:         2.20 cm     LV E/e' medial:  14.8 LV IVS:        1.30 cm     LV e' lateral:   5.44 cm/s LVOT diam:     2.10 cm     LV E/e' lateral: 17.1 LV SV:         59 LV SV Index:   24 LVOT Area:     3.46 cm  LV Volumes (MOD) LV vol d, MOD A2C: 77.9 ml LV vol s, MOD A2C: 20.5 ml LV SV MOD A2C:     57.4 ml RIGHT VENTRICLE             IVC RV S prime:     18.50 cm/s  IVC diam: 2.30 cm TAPSE (M-mode): 2.1 cm LEFT ATRIUM           Index        RIGHT ATRIUM           Index LA Vol (A4C): 71.1 ml 29.47 ml/m  RA Area:     22.70 cm                                    RA Volume:   68.80 ml  28.52 ml/m  AORTIC VALVE                    PULMONIC VALVE AV Area (Vmax):    3.32 cm     PV Vmax:        0.89 m/s AV Area (Vmean):   3.20 cm     PV Vmean:       63.700 cm/s AV Area (VTI):     3.23 cm     PV VTI:         0.158 m AV Vmax:           110.50 cm/s  PV Peak grad:   3.2 mmHg AV Vmean:          75.650 cm/s  PV Mean grad:   2.0 mmHg AV VTI:            0.181 m      RVOT Peak grad: 2 mmHg AV Peak Grad:      4.9 mmHg AV Mean Grad:      3.0 mmHg LVOT Vmax:         106.00 cm/s LVOT Vmean:        69.800 cm/s LVOT VTI:          0.169 m LVOT/AV VTI ratio: 0.93  AORTA Ao Root diam: 2.90 cm Ao Asc diam:  3.80 cm MITRAL VALVE MV Area (PHT): 3.17 cm     SHUNTS MV Area VTI:   3.68 cm      Systemic VTI:  0.17 m MV Peak grad:  3.6 mmHg  Systemic Diam: 2.10 cm MV Mean grad:  2.0 mmHg     Pulmonic VTI:  0.119 m MV Vmax:       0.95 m/s MV Vmean:      58.9 cm/s MV Decel Time: 239 msec MV E velocity: 93.20 cm/s MV A velocity: 112.00 cm/s MV E/A ratio:  0.83 Marsa Dooms MD Electronically signed by Marsa Dooms MD Signature Date/Time: 12/25/2023/4:32:43 PM    Final    DG Chest 2 View Result Date: 12/24/2023 EXAM: 2 VIEW(S) XRAY OF THE CHEST 12/24/2023 06:38:21 PM COMPARISON: None available. CLINICAL HISTORY: shortness of breath FINDINGS: LINES, TUBES AND DEVICES: Right IJ approach Port-A-Cath in place with tip projecting at the superior right atrium. LUNGS AND PLEURA: No focal pulmonary opacity. No pleural effusion. No pneumothorax. HEART AND MEDIASTINUM: Cardiomegaly. Aortic atherosclerosis. BONES AND SOFT TISSUES: No acute osseous abnormality. IMPRESSION: 1. Right IJ approach Port-A-Cath with tip projecting at the superior right atrium. 2. Cardiomegaly. 3. Aortic atherosclerosis. Electronically signed by: Greig Pique MD 12/24/2023 06:51 PM EST RP Workstation: [id number]   CT CHEST ABDOMEN PELVIS W CONTRAST Result Date: 12/24/2023 EXAM: CT CHEST, ABDOMEN AND PELVIS WITH CONTRAST 12/24/2023 06:14:55 PM TECHNIQUE: CT of the chest, abdomen and pelvis was performed with the administration of 100 mL of iohexol  (OMNIPAQUE ) 300 MG/ML solution. Multiplanar reformatted images are provided for review. Automated exposure control, iterative reconstruction, and/or weight based adjustment of the mA/kV was utilized to reduce the radiation dose to as low as reasonably achievable. COMPARISON: CT chest abdomen and pelvis 10/22/2023. CLINICAL HISTORY: ovarian cancer ovarian cancer FINDINGS: CHEST: MEDIASTINUM AND LYMPH NODES: There is a new large pericardial effusion. Right chest port catheter tip ends in the distal SVC. The central airways are clear. No mediastinal, hilar or axillary  lymphadenopathy. LUNGS AND PLEURA: There is a new small right pleural effusion. There is a calcified granuloma in the right upper lobe. No focal consolidation or pulmonary edema. No pneumothorax. ABDOMEN AND PELVIS: LIVER: The liver is unremarkable. GALLBLADDER AND BILE DUCTS: The gallbladder is surgically absent. No biliary ductal dilatation. SPLEEN: No acute abnormality. PANCREAS: No acute abnormality. ADRENAL GLANDS: Low attenuation left adrenal adenoma or myelolipoma measures 3 cm; this is better seen on today's study when compared to the prior examination. The right adrenal gland appears normal. KIDNEYS, URETERS AND BLADDER: There is a small fat containing lesion in the left kidney posteriorly measuring 9 mm, likely angiomyolipoma. Subcentimeter hypodensity may represent a cyst in the inferior pole of the left kidney. There is a 5 mm fat containing lesion in the inferior pole of the right kidney which is unchanged, likely angiomyolipoma. No stones in the kidneys or ureters. No hydronephrosis. No perinephric or periureteral stranding. Urinary bladder is unremarkable. GI AND BOWEL: The appendix is prominent in size measuring up to 8 mm. There is some trace surrounding inflammatory stranding. There is no fluid collection or perforation. Stomach demonstrates no acute abnormality. There is no bowel obstruction. REPRODUCTIVE ORGANS: The uterus is surgically absent. The ovaries are not definitively seen. PERITONEUM AND RETROPERITONEUM: There is trace free fluid in the right upper quadrant which is new from prior. No free air. VASCULATURE: Aorta is normal in caliber. There are atherosclerotic calcifications of the aorta. ABDOMINAL AND PELVIS LYMPH NODES: There are scattered nonenlarged retroperitoneal lymph nodes which are similar to the prior study. BONES AND SOFT TISSUES: No acute osseous abnormality. No focal soft tissue abnormality. IMPRESSION: 1. New large pericardial effusion. 2. New small right pleural effusion.  3. Prominent appendix  with trace surrounding inflammatory stranding, without fluid collection or perforation. Please correlate clinically for early acute appendicitis. 4. Low attenuation 3 cm left adrenal lesion, favored adenoma or myelolipoma. 5. No evidence of metastatic disease. Electronically signed by: Greig Pique MD 12/24/2023 06:42 PM EST RP Workstation: [id number]    ECHO as above  TELEMETRY (personally reviewed): Normal sinus rhythm rate 80s  EKG (personally reviewed): sinus tach rate 101 bpm  Data reviewed by me 12/29/2023: last 24h vitals tele labs imaging I/O ED provider note, admission H&P, hospitalist progress note  Principal Problem:   Pericardial effusion, without tamponade Active Problems:   Essential hypertension   Asthma without status asthmaticus   Neuropathy, diabetic (HCC)   Migraine without aura and without status migrainosus, not intractable   Diabetes mellitus, type 2 (HCC)   S/P left  BKA 09/24/22 secondary to osteomyelitis (below knee amputation) (HCC)   Obesity, Class III, BMI 40-49.9 (morbid obesity) (HCC)   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 female  with a past medical history of HTN, DM , left BKA, asthma, morbid obesity, NICM (EF 70% 12/08/2023), stage 3 ovarian CA on maintenance chemotherapy, currently on hold due to chemotherapy induced diarrhea who presented to the ED on 12/24/2023 for abnormal CT results which showed large pericardial effusion. Cardiology was consulted for further evaluation.   # Atrial fibrillation RVR # New onset atrial fibrillation # Large pericardial effusion # Ovarian cancer Patient was sent to the ED for evaluation of  pericardial effusion which was noted on CT chest yesterday to be large.  This was originally diagnosed during hospitalization last month at [facility], echo at that time reportedly showed moderate pericardial effusion.  She is in atrial fibrillation RVR on telemetry 12/25/2023 and converted to normal sinus rhythm later that day.  Echo 12/25/2023 with large pericardial effusion, EF 55-60% with no evidence of diastolic collapse or tamponade.-Plan for pericardiocentesis this a.m. given large pericardial effusion, patient has remained hemodynamically stable.  Will plan to send fluid for cytology given her cancer diagnosis. - Pericardial drain with minimal output over last 24 hours, <10 cc. Will remove today. - Continue eliquis  5 mg twice daily.  - Continue PO amiodarone  load with 400 mg twice daily for 5 more days followed by 200 mg daily. - Further management per primary team.   This patient's plan of care was discussed and created with Dr. Florencio and he is in agreement.  Signed: Danita Bloch, PA-C  12/29/2023, 9:39 AM [facility]      "

## 2023-12-29 NOTE — Progress Notes (Signed)
 " PROGRESS NOTE    Nancy [name]  FMW:[id number] DOB: August 10, 1965 DOA: 12/24/2023 PCP: Orlean Alan HERO, FNP    Brief Narrative:  58 y.o. female with medical history significant for HTN, DM , left BKA, asthma, morbid obesity, NICM (EF 70% 12/08/2023), stage 3 ovarian CA on maintenance chemotherapy, currently on hold due to chemotherapy induced diarrhea, was admitted with with a large pericardial effusion. Patient was recently hospitalized at [facility] (11/29 - 12/09/2023) with an acute pericardial effusion attributed to malignancy/chemo after presenting with chest pain and dyspnea on exertion.   Serial echocardiograms (most recently 12/09/23)showed stable moderate, circumferential pericardial effusion.  She was discharged on colchicine .  Since discharge she has had ongoing chest now extending to upper abdomen as well as dyspnea on exertion.  At her oncology follow-up on 12/24/2023 her oncologist ordered CT chest which showed a large effusion and she was directed to the ED for further workup.  Seen by cardiology, underwent large-volume pericardiocentesis 12/19, 900 cc bloody fluid drained from pericardial effusion, symptomatically doing better. 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)   Diabetes mellitus, type 2 (HCC)   Obesity, Class III, BMI 40-49.9 (morbid obesity) (HCC)   Generalized anxiety disorder   Asthma without status asthmaticus   Neuropathy, diabetic (HCC)   Migraine without aura and without status migrainosus, not intractable   S/P left  BKA 09/24/22 secondary to osteomyelitis (below knee amputation) (HCC)   Chemotherapy induced diarrhea   Chronic obstructive pulmonary disease, unspecified COPD type (HCC)   Acute appendicitis   Acute pericardial effusion  Pericardial  effusion, without tamponade Recent acute pericarditis 12/07/2023 at [facility](moderate effusion on echo 12/09/23) Unclear etiology, Cytology pending Pericardiocentesis performed 12/19, 900 cc bloody fluid drained Plan to remove pericardial drain today Cardiology following, appreciate recs Holding colchcine   New onset rapid atrial fibrillation - Resolved  Continue po amiodarone  400 mg bid x 5 days followed by 200 mg daily, Eliquis  5 mg bid   NICM (nonischemic cardiomyopathy) (HCC) History of Takotsubo cardiomyopathy 2010 Last echo 12/09/2023 with EF 70%(previously 30% in 2010) Continue metoprolol , resume Lisinopril  5 mg  Essential hypertension On coreg  , Resume Lisinopril  5mg    Primary high grade serous adenocarcinoma of ovary Chemotherapy induced diarrhea(olaparib ) S/p neoadjuvant chemoradiation, on maintenance chemotherapy, currently held Recently saw oncology 12/22/2023 when chemo was held due to diarrhea   Diabetes mellitus, type 2 (HCC) Poorly controlled A1c 8.6.  Hold home antidiabetes medications.  Resume Novolog  70/30 15u bid with meals (takes 70u bid at home) Sliding scale insulin  coverage   Generalized anxiety disorder Continue Pristiq , mirtazapine  and alprazolam    Obesity, Class III, BMI 40-49.9 (morbid obesity) (HCC) Complicating factor to overall prognosis and care   Asthma without status asthmaticus Not acutely exacerbated Albuterol  nebulizer as needed   S/P left  BKA 09/24/22 secondary to osteomyelitis (below knee amputation) (HCC) No acute issues  Abdominal pain - resolved There was some clinical concern for appendicitis.  Patient has ovarian cancer Though the diagnosis of appendicitis is felt to be unlikely it is prudent to resume a 5-day course of antibiotics per general surgery. She did have repeat CT A/P with no findings to suggest etiology for her symptoms. Her abdominal discomfort is much improved on exam. She states that her pain is mostly at the insertion  of the  pericardial drain and along the epigastrium.   -Complete five d course of antibiotics - On IV Zosyn  -Pain control   DVT prophylaxis: SCD Code Status: Full Family Communication: Spouse at bedside 12/22 Disposition Plan: Status is: Inpatient Remains inpatient appropriate because: Multiple acute issues as above    Level of care: Progressive  Consultants:  General Surgery Cardiology  Procedures:  None  Antimicrobials: Zosyn    Subjective: Abd pain is improved. Having pain where drain is inserted and along this region. APAP and oxy help with this pain.   Had bowel movement yesterday   Objective: Vitals:   12/29/23 0600 12/29/23 0700 12/29/23 0724 12/29/23 0800  BP: 130/66 (!) 143/67  (!) 140/78  Pulse: (!) 58 (!) 59    Resp: 11 10  18   Temp:   98.4 F (36.9 C)   TempSrc:   Oral   SpO2: 91% 90%    Weight:      Height:        Intake/Output Summary (Last 24 hours) at 12/29/2023 1010 Last data filed at 12/29/2023 0941 Gross per 24 hour  Intake 672.51 ml  Output 0 ml  Net 672.51 ml   Filed Weights   12/24/23 1821  Weight: 130.6 kg    Examination:    Constitutional: In no distress.  Cardiovascular: Normal rate, regular rhythm. Pericardial drain in place w/ scant mostly sanguinous drainage. No lower extremity edema  Pulmonary: Non labored breathing on room air, no wheezing or rales.   Abdominal: Soft. Non distended and non tender Musculoskeletal: s/p L BKA    Neurological: Alert and oriented to person, place, and time. Non focal  Skin: Skin is warm and dry.    Data Reviewed: I have personally reviewed following labs and imaging studies  CBC: Recent Labs  Lab 12/22/23 1308 12/24/23 1823 12/26/23 0202 12/27/23 0346 12/28/23 0429 12/28/23 1547 12/29/23 0403  WBC 8.4   < > 10.5 10.0 7.0 7.5 6.5  NEUTROABS 4.5  --   --   --   --   --   --   HGB 9.9*   < > 10.4* 10.0* 8.9* 9.6* 9.1*  HCT 31.9*   < > 33.7* 31.9* 29.0* 30.6* 29.5*  MCV 102.6*   <  > 102.7* 100.6* 100.7* 99.7 100.7*  PLT 499*   < > 454* 378 311 321 304   < > = values in this interval not displayed.   Basic Metabolic Panel: Recent Labs  Lab 12/22/23 1308 12/24/23 1823 12/25/23 0523 12/26/23 0202 12/27/23 0346 12/28/23 0429 12/29/23 0403  NA 137   < > 135 133* 137 139 138  K 4.4   < > 4.3 4.5 3.8 3.7 3.8  CL 102   < > 99 98 102 103 100  CO2 23   < > 24 22 28 30 31   GLUCOSE 163*   < > 178* 199* 188* 188* 202*  BUN 23*   < > 31* 36* 27* 18 15  CREATININE 0.90   < > 1.15* 1.28* 1.02* 0.93 0.90  CALCIUM 9.4   < > 8.8* 8.9 8.3* 8.2* 8.3*  MG 2.1  --   --  2.2  --   --   --    < > = values in this interval not displayed.   GFR: Estimated Creatinine Clearance: 97.8 mL/min (by C-G formula based on SCr of 0.9 mg/dL). Liver Function Tests: Recent Labs  Lab 12/22/23 1308 12/24/23 2128  AST 21 19  ALT 31 31  ALKPHOS 92 99  BILITOT 0.4 0.5  PROT 7.5 6.9  ALBUMIN 4.1 3.7   No results for input(s): LIPASE, AMYLASE in the last 168 hours. No results for input(s): AMMONIA in the last 168 hours. Coagulation Profile: No results for input(s): INR, PROTIME in the last 168 hours. Cardiac Enzymes: No results for input(s): CKTOTAL, CKMB, CKMBINDEX, TROPONINI in the last 168 hours. BNP (last 3 results) Recent Labs    12/24/23 1823  PROBNP 211.0   HbA1C: No results for input(s): HGBA1C in the last 72 hours. CBG: Recent Labs  Lab 12/28/23 1130 12/28/23 1634 12/28/23 1940 12/28/23 2115 12/29/23 0733  GLUCAP 133* 196* 208* 206* 207*   Lipid Profile: No results for input(s): CHOL, HDL, LDLCALC, TRIG, CHOLHDL, LDLDIRECT in the last 72 hours. Thyroid  Function Tests: No results for input(s): TSH, T4TOTAL, FREET4, T3FREE, THYROIDAB in the last 72 hours. Anemia Panel: Recent Labs    12/29/23 0403  FOLATE 16.3   Sepsis Labs: No results for input(s): PROCALCITON, LATICACIDVEN in the last 168 hours.  Recent  Results (from the past 240 hours)  Body fluid culture w Gram Stain     Status: None (Preliminary result)   Collection Time: 12/26/23  9:47 AM   Specimen: PATH Cytology Misc. fluid; Body Fluid  Result Value Ref Range Status   Specimen Description   Final    PERICARDIAL Performed at [facility], [address] [place], [place] [address]    Special Requests   Final    PERICARDIAL Performed at [facility], [address] [place], [place] [address]    Gram Stain   Final    FEW WBC PRESENT, PREDOMINANTLY MONONUCLEAR NO ORGANISMS SEEN    Culture   Final    NO GROWTH 2 DAYS Performed at [facility], [address] [place], [place] [address]    Report Status PENDING  Incomplete  MRSA Next Gen by PCR, Nasal     Status: None   Collection Time: 12/26/23 10:50 AM   Specimen: Nasal Mucosa; Nasal Swab  Result Value Ref Range Status   MRSA by PCR Next Gen NOT DETECTED NOT DETECTED Final    Comment: (NOTE) The GeneXpert MRSA Assay (FDA approved for NASAL specimens only), is one component of a comprehensive MRSA colonization surveillance program. It is not intended to diagnose MRSA infection nor to guide or monitor treatment for MRSA infections. Test performance is not FDA approved in patients less than 62 years old. Performed at [facility], [address], [place], [place] [address]          Radiology Studies: ECHOCARDIOGRAM LIMITED Result Date: 12/28/2023    ECHOCARDIOGRAM LIMITED REPORT   Patient Name:   DAFFNEY GREENLY Date of Exam: 12/28/2023 Medical Rec #:  [id number]           Height:       69.0 in Accession #:    [id number]          Weight:       288.0 lb Date of Birth:  1965/09/20          BSA:          2.412 m Patient Age:    58 years            BP:           151/65 mmHg Patient Gender: F                   HR:  66 bpm. Exam Location:  [facility] Procedure: 2D Echo and Cardiac Doppler (Both Spectral and Color Flow Doppler             were utilized during procedure). Indications:     Pericardial effusion I31.3  History:         Patient has prior history of Echocardiogram examinations, most                  recent 12/26/2023.  Sonographer:     Thedora Louder RDCS, FASE Referring Phys:  [id number] FLYJFFJI A ARIDA Diagnosing Phys: Denyse Bathe  Sonographer Comments: Technically difficult study due to poor echo windows. Image acquisition challenging due to patient body habitus. This study was performed with patient supine. IMPRESSIONS  1. Left ventricular ejection fraction, by estimation, is 55 to 60%. The left ventricle has normal function. Left ventricular diastolic function could not be evaluated.  2. Left atrial size was mild to moderately dilated.  3. Right atrial size was mild to moderately dilated.  4. A small pericardial effusion is present. The pericardial effusion is circumferential. There is no evidence of cardiac tamponade.  5. The mitral valve was not assessed.  6. The aortic valve was not assessed. FINDINGS  Left Ventricle: Left ventricular ejection fraction, by estimation, is 55 to 60%. The left ventricle has normal function. Left ventricular diastolic function could not be evaluated. Left Atrium: Left atrial size was mild to moderately dilated. Right Atrium: Right atrial size was mild to moderately dilated. Pericardium: A small pericardial effusion is present. The pericardial effusion is circumferential. There is no evidence of cardiac tamponade. Mitral Valve: The mitral valve was not assessed. Tricuspid Valve: The tricuspid valve is not assessed. Aortic Valve: The aortic valve was not assessed. Additional Comments: Spectral Doppler performed.  LEFT VENTRICLE PLAX 2D LVIDd:         4.40 cm LVIDs:         3.10 cm LV PW:         1.30 cm LV IVS:        1.30 cm  LEFT ATRIUM         Index LA diam:    3.90 cm 1.62 cm/m Denyse Bathe Electronically signed by Denyse Bathe Signature Date/Time: 12/28/2023/10:51:10 AM    Final    CT ABDOMEN  PELVIS WO CONTRAST Result Date: 12/27/2023 CLINICAL DATA:  Nonlocalized abdominal pain. Hypertension. Below-the-knee amputation. Morbid obesity. Stage III ovarian cancer on chemotherapy. Pericardial effusion. * Tracking Code: BO * EXAM: CT ABDOMEN AND PELVIS WITHOUT CONTRAST TECHNIQUE: Multidetector CT imaging of the abdomen and pelvis was performed following the standard protocol without IV contrast. RADIATION DOSE REDUCTION: This exam was performed according to the departmental dose-optimization program which includes automated exposure control, adjustment of the mA and/or kV according to patient size and/or use of iterative reconstruction technique. COMPARISON:  12/24/2023 FINDINGS: Lower chest: Bibasilar atelectasis. Decrease in tiny pericardial effusion. Increase in small right and new small left pleural effusions. Compressive bibasilar atelectasis. Hepatobiliary: Moderate hepatic steatosis. Hepatomegaly at 18.6 cm craniocaudal. Cholecystectomy, without biliary ductal dilatation. Pancreas: Normal, without mass or ductal dilatation. Spleen: Normal in size, without focal abnormality. Adrenals/Urinary Tract: Normal right adrenal gland. Fat density left adrenal nodule of 2.7 cm is consistent with a benign myelolipoma. Punctate left renal collecting system calculus. Right renal vascular calcification. No hydronephrosis. No hydroureter or ureteric calculi. No bladder calculi. Stomach/Bowel: Normal stomach, without wall thickening. Dense contrast within the colon, which is normal in caliber. Normal terminal ileum. The  appendix measures up to 7 mm, without surrounding inflammation on image 67/5. Normal small bowel. Vascular/Lymphatic: Advanced aortic and branch vessel atherosclerosis. Small abdominal retroperitoneal nodes, none pathologic by size criteria. Reproductive: Hysterectomy.  No adnexal mass. Other: No significant free fluid. Mild pelvic floor laxity. No evidence of omental or peritoneal disease. Mild  anasarca. Musculoskeletal: Lumbosacral spondylosis. IMPRESSION: 1. Normal appendix, without acute process in the abdomen or pelvis. 2. No evidence of metastatic disease, status post hysterectomy. 3. Decreased tiny pericardial effusion. Increased right and new left pleural effusions, both small. 4. Left nephrolithiasis 5. Hepatic steatosis 6.  Aortic Atherosclerosis (ICD10-I70.0). Electronically Signed   By: Rockey Kilts M.D.   On: 12/27/2023 15:16        Scheduled Meds:  amiodarone   400 mg Oral BID   Followed by   NOREEN ON 01/03/2024] amiodarone   200 mg Oral Daily   apixaban   5 mg Oral BID   carvedilol   6.25 mg Oral BID   Chlorhexidine  Gluconate Cloth  6 each Topical Daily   insulin  aspart  0-15 Units Subcutaneous TID WC   insulin  aspart  0-5 Units Subcutaneous QHS   pantoprazole   40 mg Oral Daily   polyethylene glycol  17 g Oral Daily   senna-docusate  1 tablet Oral BID   simethicone   80 mg Oral QID   venlafaxine  XR  150 mg Oral Q breakfast   Continuous Infusions:  piperacillin -tazobactam (ZOSYN )  IV 12.5 mL/hr at 12/29/23 0941   promethazine  (PHENERGAN ) injection (IM or IVPB) Stopped (12/27/23 2216)     LOS: 4 days   I personally spent a total of 56 minutes in the care of the patient today including getting/reviewing separately obtained history, performing a medically appropriate exam/evaluation, counseling and educating, placing orders, referring and communicating with other health care professionals, documenting clinical information in the EHR, and coordinating care.    Laree Lock, MD [facility]   If 7PM-7AM, please contact night-coverage  12/29/2023, 10:10 AM    "

## 2023-12-29 NOTE — Inpatient Diabetes Management (Signed)
 Inpatient Diabetes Program Recommendations  AACE/ADA: New Consensus Statement on Inpatient Glycemic Control (2015)  Target Ranges:  Prepandial:   less than 140 mg/dL      Peak postprandial:   less than 180 mg/dL (1-2 hours)      Critically ill patients:  140 - 180 mg/dL    Latest Reference Range & Units 12/28/23 07:45 12/28/23 11:30 12/28/23 16:34 12/28/23 19:40 12/28/23 21:15  Glucose-Capillary 70 - 99 mg/dL 816 (H) 866 (H) 803 (H) 208 (H) 206 (H)  (H): Data is abnormally high  Latest Reference Range & Units 12/29/23 07:33 12/29/23 11:48  Glucose-Capillary 70 - 99 mg/dL 792 (H) 793 (H)  (H): Data is abnormally high      Home DM Meds: 70/30 Insulin  70 units BID  Current Orders: Novolog  Moderate Correction Scale/ SSI (0-15 units) TID AC + HS    MD- Note AM CBGs >120  Takes 70/30 Insulin  at home--Not sure if the doses listed in the Home Med Rec are correct??  Please consider starting Lantus  10 units at HS (~0.1 units/kg)     --Will follow patient during hospitalization--  Adina Rudolpho Arrow RN, MSN, CDCES Diabetes Coordinator Inpatient Glycemic Control Team Team Pager: [phone] (8a-5p)

## 2023-12-29 NOTE — TOC Initial Note (Signed)
 Transition of Care [facility]) - Initial/Assessment Note    Patient Details  Name: Nancy [name] MRN: [id number] Date of Birth: 06-21-1965  Transition of Care [facility]) CM/SW Contact:    Corrie JINNY Ruts, LCSW Phone Number: 12/29/2023, 11:12 AM  Clinical Narrative:                 Chart reviewed. I was able to speak with the patient and her spouse at bed side today. I introduced myself, my role, and reason for visit.   The patient reports she is doing well today. The patient confirms that she has a PCP. The patient reports that she lives in the home with her spouse. The patient reports that she needed a little assistance around the home. The patient reports that her spouse or friend takes her to medical appointment. The patient reports that her spouse will assist her during D/C.  The patient reports that she uses [facility]. The patient reports that she has had HH almost a year ago but does not remember the agency. The patient reports that she has never been admitted into a SNF in the past. The patient reports that she has a cane, walker, wheel chair, and BSC in the home.   SW inquired about HH interest. The patient confirmed that she would be interested in [facility] if that is the recommendation. SW inquired about SNF placement if recommended and the patient was unsure about but was more towards declining SNF if recommended.   The are no ICM needs at this time.       Barriers to Discharge: Continued Medical Work up   Patient Goals and CMS Choice            Expected Discharge Plan and Services                                              Prior Living Arrangements/Services   Lives with:: Spouse Patient language and need for interpreter reviewed:: Yes        Need for Family Participation in Patient Care: Yes (Comment)     Criminal Activity/Legal Involvement Pertinent to Current Situation/Hospitalization: No - Comment as needed  Activities of Daily Living   ADL  Screening (condition at time of admission) Independently performs ADLs?: Yes (appropriate for developmental age) Is the patient deaf or have difficulty hearing?: No Does the patient have difficulty seeing, even when wearing glasses/contacts?: No Does the patient have difficulty concentrating, remembering, or making decisions?: No  Permission Sought/Granted                  Emotional Assessment Appearance:: Appears stated age Attitude/Demeanor/Rapport: Gracious Affect (typically observed): Calm Orientation: : Oriented to Place, Oriented to Self, Oriented to  Time, Oriented to Situation Alcohol / Substance Use: Not Applicable Psych Involvement: No (comment)  Admission diagnosis:  Generalized anxiety disorder [F41.1] Shortness of breath [R06.02] Acute pericardial effusion [I30.9] Pericardial effusion [I31.39] Abdominal pain of multiple sites [R10.85] Acute appendicitis, unspecified acute appendicitis type [K35.80] Chest pain, unspecified type [R07.9] Patient Active Problem List   Diagnosis Date Noted   Acute appendicitis 12/25/2023   Acute pericardial effusion 12/25/2023   Pericardial effusion, without tamponade 12/22/2023   Elevated LFTs 12/22/2023   Cervical facet joint syndrome 11/04/2023   Cervical radicular pain 11/04/2023   Phantom limb syndrome with pain (HCC) 11/04/2023   Chronic pain syndrome  11/04/2023   Chronic obstructive pulmonary disease, unspecified COPD type (HCC) 07/17/2023   Chemotherapy induced diarrhea 06/27/2023   Hypomagnesemia 06/27/2023   Neck pain 06/27/2023   Port-A-Cath in place 06/27/2023   Atherosclerosis of native arteries of extremity with intermittent claudication 06/04/2023   Chemotherapy-induced neuropathy 03/13/2023   Neuropathy 03/13/2023   Generalized anxiety disorder 02/28/2023   Blurring of vision 12/13/2022   Chemotherapy-induced nausea 12/13/2022   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   Encounter for  antineoplastic chemotherapy 11/21/2022   Neoplasm related pain 11/21/2022   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   Protein calorie malnutrition 11/06/2022   IDA (iron  deficiency anemia) 11/06/2022   Normocytic anemia 10/28/2022   Malignant ascites (HCC) 10/23/2022   Hypotension 10/22/2022   Intractable vomiting 10/22/2022   S/P left  BKA 09/24/22 secondary to osteomyelitis (below knee amputation) (HCC) 10/22/2022   Diarrhea 10/22/2022   Adjustment disorder with mixed anxiety and depressed mood 09/26/2022   B12 deficiency due to diet 05/16/2022   Vitamin D  deficiency, unspecified 05/16/2022   Atherosclerosis of native arteries of the extremities with ulceration (HCC) 01/30/2021   Diabetes mellitus, type 2 (HCC) 03/13/2015   Allergic rhinitis 05/10/2014   Asthma without status asthmaticus 05/10/2014   Atherosclerosis of native coronary artery of native heart with angina pectoris 05/10/2014   Clinical depression 05/10/2014   Neuropathy, diabetic (HCC) 05/10/2014   Acid reflux 05/10/2014   Mixed hyperlipidemia 05/10/2014   Migraine without aura and without status migrainosus, not intractable 05/10/2014   Takotsubo cardiomyopathy 05/10/2014   Obesity, Class III, BMI 40-49.9 (morbid obesity) (HCC) 05/10/2014   NICM (nonischemic cardiomyopathy) (HCC) 05/10/2014   Essential hypertension 08/09/2008   Heart failure (HCC) 06/06/2008   PCP:  Orlean Alan HERO, FNP Pharmacy:   [id number], [place] - [address] AT [facility] [address] [place] [address] Phone: [phone] Fax: [phone]     Social Drivers of Health (SDOH) Social History: SDOH Screenings   Food Insecurity: No Food Insecurity (12/26/2023)  Housing: Low Risk (12/27/2023)  Transportation Needs: No Transportation Needs (12/26/2023)  Recent Concern: Transportation Needs - Unmet Transportation Needs (12/01/2023)   Received from [facility]  Utilities: Not At Risk (12/27/2023)  Alcohol Screen: Low Risk (12/24/2023)  Depression (PHQ2-9): Low Risk (11/04/2023)  Financial Resource Strain: Medium Risk (12/01/2023)   Received from [facility]  Tobacco Use: Medium Risk (12/24/2023)   SDOH Interventions:     Readmission Risk Interventions    12/29/2023   11:11 AM 10/30/2022    1:50 PM  Readmission Risk Prevention Plan  Transportation Screening Complete Complete  PCP or Specialist Appt within 3-5 Days Complete Complete  HRI or Home Care Consult  --  Social Work Consult for Recovery Care Planning/Counseling Complete Complete  Palliative Care Screening Not Applicable   Medication Review Oceanographer) Complete Complete

## 2023-12-29 NOTE — Plan of Care (Signed)
" °  Problem: Education: Goal: Ability to describe self-care measures that may prevent or decrease complications (Diabetes Survival Skills Education) will improve Outcome: Progressing   Problem: Coping: Goal: Ability to adjust to condition or change in health will improve Outcome: Progressing   Problem: Fluid Volume: Goal: Ability to maintain a balanced intake and output will improve Outcome: Progressing   Problem: Health Behavior/Discharge Planning: Goal: Ability to manage health-related needs will improve Outcome: Progressing   Problem: Metabolic: Goal: Ability to maintain appropriate glucose levels will improve Outcome: Progressing   Problem: Nutritional: Goal: Maintenance of adequate nutrition will improve Outcome: Progressing Goal: Progress toward achieving an optimal weight will improve Outcome: Progressing   Problem: Skin Integrity: Goal: Risk for impaired skin integrity will decrease Outcome: Progressing   Problem: Tissue Perfusion: Goal: Adequacy of tissue perfusion will improve Outcome: Progressing   Problem: Clinical Measurements: Goal: Ability to maintain clinical measurements within normal limits will improve Outcome: Progressing Goal: Will remain free from infection Outcome: Progressing Goal: Diagnostic test results will improve Outcome: Progressing Goal: Respiratory complications will improve Outcome: Progressing Goal: Cardiovascular complication will be avoided Outcome: Progressing   Problem: Clinical Measurements: Goal: Ability to maintain clinical measurements within normal limits will improve Outcome: Progressing Goal: Will remain free from infection Outcome: Progressing Goal: Diagnostic test results will improve Outcome: Progressing Goal: Respiratory complications will improve Outcome: Progressing Goal: Cardiovascular complication will be avoided Outcome: Progressing   Problem: Activity: Goal: Risk for activity intolerance will  decrease Outcome: Progressing   Problem: Nutrition: Goal: Adequate nutrition will be maintained Outcome: Progressing   Problem: Coping: Goal: Level of anxiety will decrease Outcome: Progressing   Problem: Elimination: Goal: Will not experience complications related to bowel motility Outcome: Progressing Goal: Will not experience complications related to urinary retention Outcome: Progressing   Problem: Pain Managment: Goal: General experience of comfort will improve and/or be controlled Outcome: Progressing   Problem: Safety: Goal: Ability to remain free from injury will improve Outcome: Progressing   Problem: Skin Integrity: Goal: Risk for impaired skin integrity will decrease Outcome: Progressing   Problem: Activity: Goal: Ability to implement measures to reduce episodes of fatigue will improve Outcome: Progressing   Problem: Bowel/Gastric: Goal: Will not experience complications related to bowel motility Outcome: Progressing   Problem: Coping: Goal: Ability to identify and develop effective coping behavior will improve Outcome: Progressing   Problem: Nutritional: Goal: Maintenance of adequate nutrition will improve Outcome: Progressing   Problem: Education: Goal: Ability to demonstrate management of disease process will improve Outcome: Progressing Goal: Ability to verbalize understanding of medication therapies will improve Outcome: Progressing   Problem: Activity: Goal: Capacity to carry out activities will improve Outcome: Progressing   Problem: Cardiac: Goal: Ability to achieve and maintain adequate cardiopulmonary perfusion will improve Outcome: Progressing   Problem: Activity: Goal: Ability to tolerate increased activity will improve Outcome: Progressing Goal: Will verbalize the importance of balancing activity with adequate rest periods Outcome: Progressing   Problem: Respiratory: Goal: Ability to maintain a clear airway will  improve Outcome: Progressing Goal: Levels of oxygenation will improve Outcome: Progressing Goal: Ability to maintain adequate ventilation will improve Outcome: Progressing   "

## 2023-12-29 NOTE — Telephone Encounter (Signed)
 Patient called from ICU-rm 4 to request Dr. Babara to come see her in the [facility] and also order a ca125 which is due for tomorrow's labs in [facility].

## 2023-12-30 ENCOUNTER — Other Ambulatory Visit: Payer: Self-pay | Admitting: Oncology

## 2023-12-30 ENCOUNTER — Other Ambulatory Visit (INDEPENDENT_AMBULATORY_CARE_PROVIDER_SITE_OTHER): Payer: Self-pay | Admitting: Nurse Practitioner

## 2023-12-30 ENCOUNTER — Other Ambulatory Visit: Payer: Self-pay

## 2023-12-30 ENCOUNTER — Inpatient Hospital Stay

## 2023-12-30 ENCOUNTER — Ambulatory Visit: Admitting: Family

## 2023-12-30 DIAGNOSIS — I3139 Other pericardial effusion (noninflammatory): Secondary | ICD-10-CM | POA: Diagnosis not present

## 2023-12-30 LAB — BASIC METABOLIC PANEL WITH GFR
Anion gap: 7 (ref 5–15)
BUN: 14 mg/dL (ref 6–20)
CO2: 32 mmol/L (ref 22–32)
Calcium: 8.5 mg/dL — ABNORMAL LOW (ref 8.9–10.3)
Chloride: 102 mmol/L (ref 98–111)
Creatinine, Ser: 0.85 mg/dL (ref 0.44–1.00)
GFR, Estimated: >60 mL/min
Glucose, Bld: 123 mg/dL — ABNORMAL HIGH (ref 70–99)
Potassium: 4 mmol/L (ref 3.5–5.1)
Sodium: 141 mmol/L (ref 135–145)

## 2023-12-30 LAB — CYTOLOGY - NON PAP

## 2023-12-30 LAB — CBC
HCT: 30.3 % — ABNORMAL LOW (ref 36.0–46.0)
Hemoglobin: 9.3 g/dL — ABNORMAL LOW (ref 12.0–15.0)
MCH: 30.9 pg (ref 26.0–34.0)
MCHC: 30.7 g/dL (ref 30.0–36.0)
MCV: 100.7 fL — ABNORMAL HIGH (ref 80.0–100.0)
Platelets: 280 K/uL (ref 150–400)
RBC: 3.01 MIL/uL — ABNORMAL LOW (ref 3.87–5.11)
RDW: 15.6 % — ABNORMAL HIGH (ref 11.5–15.5)
WBC: 6.4 K/uL (ref 4.0–10.5)
nRBC: 0 % (ref 0.0–0.2)

## 2023-12-30 LAB — GLUCOSE, CAPILLARY
Glucose-Capillary: 115 mg/dL — ABNORMAL HIGH (ref 70–99)
Glucose-Capillary: 158 mg/dL — ABNORMAL HIGH (ref 70–99)

## 2023-12-30 MED ORDER — APIXABAN 5 MG PO TABS: 5.0000 mg | ORAL_TABLET | Freq: Two times a day (BID) | ORAL | 1 refills | Status: AC

## 2023-12-30 MED ORDER — INSULIN NPH ISOPHANE & REGULAR (70-30) 100 UNIT/ML ~~LOC~~ SUSP: 20.0000 [IU] | Freq: Two times a day (BID) | SUBCUTANEOUS | Status: AC

## 2023-12-30 MED ORDER — OXYCODONE HCL 5 MG PO TABS: 2.5000 mg | ORAL_TABLET | ORAL | 0 refills | Status: AC | PRN

## 2023-12-30 MED ORDER — LISINOPRIL 5 MG PO TABS: 5.0000 mg | ORAL_TABLET | Freq: Two times a day (BID) | ORAL | 1 refills | Status: AC

## 2023-12-30 MED ORDER — ACETAMINOPHEN 325 MG PO TABS: 650.0000 mg | ORAL_TABLET | Freq: Four times a day (QID) | ORAL | 0 refills | Status: AC | PRN

## 2023-12-30 MED ORDER — AMIODARONE HCL 200 MG PO TABS: ORAL_TABLET | ORAL | 0 refills | Status: AC

## 2023-12-30 MED ORDER — HEPARIN SOD (PORK) LOCK FLUSH 100 UNIT/ML IV SOLN
500.0000 [IU] | Freq: Once | INTRAVENOUS | Status: AC
  Administered 2023-12-30: 500 [IU] via INTRAVENOUS
  Filled 2023-12-30: qty 5

## 2023-12-30 NOTE — Discharge Summary (Signed)
 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 female with medical history significant for HTN, DM , left BKA, asthma, morbid obesity, NICM (EF 70% 12/08/2023), stage 3 ovarian CA on maintenance chemotherapy, currently on hold due to chemotherapy induced diarrhea, was admitted with with a large pericardial effusion. Patient was recently hospitalized at [facility] (11/29 - 12/09/2023) with an acute pericardial effusion attributed to malignancy/chemo after presenting with chest pain and dyspnea on exertion.   Serial echocardiograms (most recently 12/09/23)showed stable moderate, circumferential pericardial effusion.  She was discharged on colchicine .  Since discharge she has had ongoing chest now extending to upper abdomen as well as dyspnea on exertion.  At her oncology follow-up  on 12/24/2023 her oncologist ordered CT chest which showed a large effusion and she was directed to the ED for further workup. Seen by cardiology, underwent large-volume pericardiocentesis 12/19, 900 cc bloody fluid drained from pericardial effusion, symptomatically doing better. Hospital course as below  Pericardial effusion, without tamponade Recent acute pericarditis 12/07/2023 at [facility](moderate effusion on echo 12/09/23) Unclear etiology, Cytology pending Pericardiocentesis performed 12/19, 900 cc bloody fluid drained Pericardial drain removed 12/22 Continue Colchicine  Follow up with Cardiology outpatient in 1-2 weeks  New onset rapid atrial fibrillation - Resolved  Continue po amiodarone  400 mg bid x 5 days followed by 200 mg daily, Eliquis  5 mg bid   NICM (nonischemic cardiomyopathy) (HCC) History of Takotsubo cardiomyopathy 2010 Last echo 12/09/2023 with EF 70%(previously 30% in 2010) Continue Coreg , Lisinopril  5 mg   Essential hypertension Continue Coreg , Lisinopril  5 mg   Primary high grade serous adenocarcinoma of ovary Chemotherapy induced diarrhea(olaparib ) S/p neoadjuvant chemoradiation, on maintenance chemotherapy, currently held Recently saw oncology 12/22/2023 when chemo was held due to diarrhea Follow up with Oncology outpatient   Diabetes mellitus, type 2 (HCC) Resume Novolog  70/30 20 u bid with meals (was on 70u bid at home) Continue metformin , not taking Mounjaro    Generalized anxiety disorder Continue Pristiq , mirtazapine  and alprazolam    Obesity, Class III, BMI 40-49.9 (morbid obesity) (HCC) Complicating factor to overall prognosis and care   Asthma without status asthmaticus Not acutely exacerbated   S/P left  BKA 09/24/22 secondary to osteomyelitis (below knee amputation) (HCC) No acute issues   Abdominal pain - resolved There was some clinical concern for appendicitis.  Patient has ovarian cancer Completed 5-day course of antibiotics per general  surgery to diagnosis of appendicitis felt to  be unlikely  Macrocytic anemia Monitor Hb, follow-up outpatient   Pain control - [place]  Controlled Substance Reporting System database was reviewed. and patient was instructed, not to drive, operate heavy machinery, perform activities at heights, swimming or participation in water  activities or provide baby-sitting services while on Pain, Sleep and Anxiety Medications; until their outpatient Physician has advised to do so again. Also recommended to not to take more than prescribed Pain, Sleep and Anxiety Medications.  Consultants: Cardiology Procedures performed: Pericardiocentesis Disposition: Home Diet recommendation:  Discharge Diet Orders (From admission, onward)     Start     Ordered   12/30/23 0000  Diet - low sodium heart healthy        12/30/23 1329           DISCHARGE MEDICATION: Allergies as of 12/30/2023       Reactions   Shellfish Allergy Anaphylaxis   Codeine Other (See Comments)   Migraine Other Reaction(s): Other (See Comments) Reaction:  Severe migraines   Morphine  And Codeine Other (See Comments)   Reaction:  Severe migraines    Rosuvastatin    Other reaction(s): Muscle Pain   Metformin  And Related Diarrhea   Percocet [oxycodone -acetaminophen ] Nausea And Vomiting   Sulfa Antibiotics Rash   Theophyllines Itching, Rash        Medication List     STOP taking these medications    ibuprofen 800 MG tablet Commonly known as: ADVIL   Mounjaro  7.5 MG/0.5ML Pen Generic drug: tirzepatide        TAKE these medications    acetaminophen  325 MG tablet Commonly known as: TYLENOL  Take 2 tablets (650 mg total) by mouth every 6 (six) hours as needed for mild pain (pain score 1-3) (or Fever >/= 101).   acidophilus Caps capsule Take 1 capsule by mouth daily.   albuterol  108 (90 Base) MCG/ACT inhaler Commonly known as: VENTOLIN  HFA Inhale 2 puffs into the lungs every 4 (four) hours as needed for  wheezing or shortness of breath. Reported on 03/29/2015   albuterol  (2.5 MG/3ML) 0.083% nebulizer solution Commonly known as: PROVENTIL  Take 2.5 mg by nebulization as needed.   ALPRAZolam  0.5 MG tablet Commonly known as: XANAX  Take 0.5-1 tablets (0.25-0.5 mg total) by mouth 2 (two) times daily as needed for anxiety. Take 1/2 to 1 tablet by mouth as needed for panic attacks   amiodarone  200 MG tablet Commonly known as: PACERONE  Take 2 tablets (400 mg total) by mouth 2 (two) times daily for 4 days, THEN 1 tablet (200 mg total) daily. Start taking on: December 30, 2023   apixaban  5 MG Tabs tablet Commonly known as: ELIQUIS  Take 1 tablet (5 mg total) by mouth 2 (two) times daily.   aspirin  EC 81 MG tablet Take 81 mg by mouth daily.   carvedilol  6.25 MG tablet Commonly known as: COREG  TAKE 1 TABLET BY MOUTH TWICE DAILY   colchicine  0.6 MG tablet Take 0.6 mg by mouth 2 (two) times daily.   desvenlafaxine  100 MG 24 hr tablet Commonly known as: PRISTIQ  Take 1 tablet (100 mg total) by mouth daily.   diphenoxylate -atropine  2.5-0.025 MG tablet Commonly known as: LOMOTIL  Take 1 tablet by mouth 4 (four) times daily as needed for diarrhea or loose stools.   Ensure Max Protein Liqd Take 330 mLs (11 oz total) by mouth 2 (two) times daily.   fenofibrate  54 MG tablet TAKE 1 TABLET BY MOUTH EVERY DAY   FreeStyle Libre 2 Sensor Misc 1 each by Does not apply  route every 14 (fourteen) days.   gabapentin  300 MG capsule Commonly known as: NEURONTIN  TAKE 1 CAPSULE BY MOUTH EVERY NIGHT AT BEDTIME   insulin  NPH-regular Human (70-30) 100 UNIT/ML injection Inject 20 Units into the skin 2 (two) times daily with a meal. What changed: how much to take   lidocaine  4 % Place 1 patch onto the skin daily as needed (for pain).   lidocaine -prilocaine  cream Commonly known as: EMLA  Apply to affected area once   lisinopril  5 MG tablet Commonly known as: ZESTRIL  Take 1 tablet (5 mg total) by  mouth 2 (two) times daily. What changed: Another medication with the same name was removed. Continue taking this medication, and follow the directions you see here.   Lynparza  100 MG tablet Generic drug: olaparib  Take 1 tablet (100 mg total) by mouth 2 (two) times daily. Swallow whole. May take with food to decrease nausea and vomiting. Take with 150mg  tablet   Lynparza  150 MG tablet Generic drug: olaparib  Take 1 tablet (150 mg total) by mouth 2 (two) times daily. Swallow whole. May take with food to decrease nausea and vomiting. Take with 100 mg tablet   metFORMIN  500 MG tablet Commonly known as: GLUCOPHAGE  TAKE 2 TABLET BY MOUTH IN THE MORNING AND 3 TABLET BY MOUTH IN THE EVENING   methocarbamol  500 MG tablet Commonly known as: ROBAXIN  Take 1 tablet (500 mg total) by mouth every 8 (eight) hours as needed for muscle spasms.   mirtazapine  7.5 MG tablet Commonly known as: REMERON  TAKE 1 TABLET BY MOUTH EVERY NIGHT AT BEDTIME   montelukast  10 MG tablet Commonly known as: SINGULAIR  Take 1 tablet (10 mg total) by mouth daily.   multivitamin with minerals Tabs tablet Take 1 tablet by mouth daily.   naloxone  4 MG/0.1ML Liqd nasal spray kit Commonly known as: NARCAN  SPRAY 1 SPRAY INTO ONE NOSTRIL AS DIRECTED FOR OPIOID OVERDOSE (TURN PERSON ON SIDE AFTER DOSE. IF NO RESPONSE IN 2-3 MINUTES OR PERSON RESPONDS BUT RELAPSES, REPEAT USING A NEW SPRAY DEVICE AND SPRAY INTO THE OTHER NOSTRIL. CALL 911 AFTER USE.) * EMERGENCY USE ONLY *   nystatin  powder Commonly known as: MYCOSTATIN /NYSTOP  APPLY TOPICALLY TWICE DAILY AS NEEDED   nystatin  ointment Commonly known as: MYCOSTATIN  Apply 1 Application topically 2 (two) times daily.   ondansetron  8 MG disintegrating tablet Commonly known as: ZOFRAN -ODT Take 1 tablet (8 mg total) by mouth every 8 (eight) hours as needed for nausea or vomiting.   ondansetron  8 MG tablet Commonly known as: Zofran  Take 1 tablet (8 mg total) by mouth every 8  (eight) hours as needed for nausea or vomiting. Start on the third day after carboplatin .   oxyCODONE  5 MG immediate release tablet Commonly known as: Oxy IR/ROXICODONE  Take 0.5-1 tablets (2.5-5 mg total) by mouth every 4 (four) hours as needed for moderate pain (pain score 4-6) or severe pain (pain score 7-10).   pantoprazole  40 MG tablet Commonly known as: PROTONIX  Take 1 tablet (40 mg total) by mouth daily.   pravastatin  40 MG tablet Commonly known as: PRAVACHOL  TAKE 1 TABLET BY MOUTH EVERY DAY   prochlorperazine  10 MG tablet Commonly known as: COMPAZINE  Take 1 tablet (10 mg total) by mouth every 6 (six) hours as needed for nausea or vomiting.   Ubrelvy  100 MG Tabs Generic drug: Ubrogepant  Take 1 tablet (100 mg total) by mouth as needed (migraine).   zinc  sulfate (50mg  elemental zinc ) 220 (50 Zn) MG capsule Take 1 capsule (220 mg total)  by mouth daily.        Follow-up Information     Launie Maiden, Ronal Maxwell, NP. Go in 1 week(s).   Specialty: Nurse Practitioner Why: Appointment scheduled for 01/07/24 at 9:30 AM Contact information: [address] [place] [address] [phone]                Discharge Exam: Fredricka Weights   12/24/23 1821  Weight: 130.6 kg   Constitutional: In no distress.  Cardiovascular: Normal rate, regular rhythm. No lower extremity edema  Pulmonary: Non labored breathing on room air, no wheezing or rales.   Abdominal: Soft. Non distended and non tender Musculoskeletal: s/p L BKA    Neurological: Alert and oriented to person, place, and time. Non focal  Skin: Skin is warm and dry  Condition at discharge: fair  The results of significant diagnostics from this hospitalization (including imaging, microbiology, ancillary and laboratory) are listed below for reference.   Imaging Studies: ECHOCARDIOGRAM LIMITED Result Date: 12/28/2023    ECHOCARDIOGRAM LIMITED REPORT   Patient Name:   RHIA BLATCHFORD Date of Exam:  12/28/2023 Medical Rec #:  [id number]           Height:       69.0 in Accession #:    [id number]          Weight:       288.0 lb Date of Birth:  23-Mar-1965          BSA:          2.412 m Patient Age:    58 years            BP:           151/65 mmHg Patient Gender: F                   HR:           66 bpm. Exam Location:  [facility] Procedure: 2D Echo and Cardiac Doppler (Both Spectral and Color Flow Doppler            were utilized during procedure). Indications:     Pericardial effusion I31.3  History:         Patient has prior history of Echocardiogram examinations, most                  recent 12/26/2023.  Sonographer:     Thedora Louder RDCS, FASE Referring Phys:  [id number] FLYJFFJI A ARIDA Diagnosing Phys: Denyse Bathe  Sonographer Comments: Technically difficult study due to poor echo windows. Image acquisition challenging due to patient body habitus. This study was performed with patient supine. IMPRESSIONS  1. Left ventricular ejection fraction, by estimation, is 55 to 60%. The left ventricle has normal function. Left ventricular diastolic function could not be evaluated.  2. Left atrial size was mild to moderately dilated.  3. Right atrial size was mild to moderately dilated.  4. A small pericardial effusion is present. The pericardial effusion is circumferential. There is no evidence of cardiac tamponade.  5. The mitral valve was not assessed.  6. The aortic valve was not assessed. FINDINGS  Left Ventricle: Left ventricular ejection fraction, by estimation, is 55 to 60%. The left ventricle has normal function. Left ventricular diastolic function could not be evaluated. Left Atrium: Left atrial size was mild to moderately dilated. Right Atrium: Right atrial size was mild to moderately dilated. Pericardium: A small pericardial effusion is present. The pericardial effusion is circumferential. There is no evidence  of cardiac tamponade. Mitral Valve: The mitral valve was not assessed. Tricuspid Valve: The tricuspid  valve is not assessed. Aortic Valve: The aortic valve was not assessed. Additional Comments: Spectral Doppler performed.  LEFT VENTRICLE PLAX 2D LVIDd:         4.40 cm LVIDs:         3.10 cm LV PW:         1.30 cm LV IVS:        1.30 cm  LEFT ATRIUM         Index LA diam:    3.90 cm 1.62 cm/m Denyse Bathe Electronically signed by Denyse Bathe Signature Date/Time: 12/28/2023/10:51:10 AM    Final    CT ABDOMEN PELVIS WO CONTRAST Result Date: 12/27/2023 CLINICAL DATA:  Nonlocalized abdominal pain. Hypertension. Below-the-knee amputation. Morbid obesity. Stage III ovarian cancer on chemotherapy. Pericardial effusion. * Tracking Code: BO * EXAM: CT ABDOMEN AND PELVIS WITHOUT CONTRAST TECHNIQUE: Multidetector CT imaging of the abdomen and pelvis was performed following the standard protocol without IV contrast. RADIATION DOSE REDUCTION: This exam was performed according to the departmental dose-optimization program which includes automated exposure control, adjustment of the mA and/or kV according to patient size and/or use of iterative reconstruction technique. COMPARISON:  12/24/2023 FINDINGS: Lower chest: Bibasilar atelectasis. Decrease in tiny pericardial effusion. Increase in small right and new small left pleural effusions. Compressive bibasilar atelectasis. Hepatobiliary: Moderate hepatic steatosis. Hepatomegaly at 18.6 cm craniocaudal. Cholecystectomy, without biliary ductal dilatation. Pancreas: Normal, without mass or ductal dilatation. Spleen: Normal in size, without focal abnormality. Adrenals/Urinary Tract: Normal right adrenal gland. Fat density left adrenal nodule of 2.7 cm is consistent with a benign myelolipoma. Punctate left renal collecting system calculus. Right renal vascular calcification. No hydronephrosis. No hydroureter or ureteric calculi. No bladder calculi. Stomach/Bowel: Normal stomach, without wall thickening. Dense contrast within the colon, which is normal in caliber. Normal terminal  ileum. The appendix measures up to 7 mm, without surrounding inflammation on image 67/5. Normal small bowel. Vascular/Lymphatic: Advanced aortic and branch vessel atherosclerosis. Small abdominal retroperitoneal nodes, none pathologic by size criteria. Reproductive: Hysterectomy.  No adnexal mass. Other: No significant free fluid. Mild pelvic floor laxity. No evidence of omental or peritoneal disease. Mild anasarca. Musculoskeletal: Lumbosacral spondylosis. IMPRESSION: 1. Normal appendix, without acute process in the abdomen or pelvis. 2. No evidence of metastatic disease, status post hysterectomy. 3. Decreased tiny pericardial effusion. Increased right and new left pleural effusions, both small. 4. Left nephrolithiasis 5. Hepatic steatosis 6.  Aortic Atherosclerosis (ICD10-I70.0). Electronically Signed   By: Rockey Kilts M.D.   On: 12/27/2023 15:16   ECHOCARDIOGRAM LIMITED Result Date: 12/26/2023    ECHOCARDIOGRAM LIMITED REPORT   Patient Name:   SHEMEIKA STARZYK Date of Exam: 12/26/2023 Medical Rec #:  [id number]           Height:       69.0 in Accession #:    [id number]          Weight:       288.0 lb Date of Birth:  1965/02/15          BSA:          2.412 m Patient Age:    58 years            BP:           145/90 mmHg Patient Gender: F                   HR:  70 bpm. Exam Location:  [facility] Procedure: Limited Echo, Color Doppler and Saline Contrast Bubble Study (Both            Spectral and Color Flow Doppler were utilized during procedure). Indications:     Pericardial effusion I31.3  History:         Patient has prior history of Echocardiogram examinations, most                  recent 12/25/2023.  Sonographer:     Ashley McNeely-Sloane Referring Phys:  [id number] CARALYN HUDSON Diagnosing Phys: Deatrice Cage MD IMPRESSIONS  1. Left ventricular ejection fraction, by estimation, is 55 to 60%. The left ventricle has normal function. The left ventricle has no regional wall motion abnormalities. Left  ventricular diastolic function could not be evaluated.  2. Right ventricular systolic function is normal. The right ventricular size is normal.  3. Large pericardial effusion initially noted. post pericardiocentesis, effusion is small with no tamponade. Agitated saline was injected via the catheter which confirmed position in pericardial space.  4. The mitral valve was not well visualized. No evidence of mitral valve regurgitation. No evidence of mitral stenosis.  5. The aortic valve was not well visualized. Aortic valve regurgitation is not visualized. No aortic stenosis is present. FINDINGS  Left Ventricle: Left ventricular ejection fraction, by estimation, is 55 to 60%. The left ventricle has normal function. The left ventricle has no regional wall motion abnormalities. The left ventricular internal cavity size was normal in size. There is  no left ventricular hypertrophy. Left ventricular diastolic function could not be evaluated. Right Ventricle: The right ventricular size is normal. No increase in right ventricular wall thickness. Right ventricular systolic function is normal. Left Atrium: Left atrial size was normal in size. Right Atrium: Right atrial size was normal in size. Pericardium: A large pericardial effusion is present. There is no evidence of cardiac tamponade. Mitral Valve: The mitral valve was not well visualized. Mild mitral annular calcification. No evidence of mitral valve stenosis. Tricuspid Valve: The tricuspid valve is not well visualized. Tricuspid valve regurgitation is not demonstrated. No evidence of tricuspid stenosis. Aortic Valve: The aortic valve was not well visualized. Aortic valve regurgitation is not visualized. No aortic stenosis is present. Pulmonic Valve: The pulmonic valve was not well visualized. Pulmonic valve regurgitation is not visualized. No evidence of pulmonic stenosis. Aorta: The aortic root is normal in size and structure. IAS/Shunts: No atrial level shunt detected  by color flow Doppler. Agitated saline contrast was given intravenously to evaluate for intracardiac shunting. Deatrice Cage MD Electronically signed by Deatrice Cage MD Signature Date/Time: 12/26/2023/2:38:28 PM    Final    CARDIAC CATHETERIZATION Result Date: 12/26/2023 Successful pericardiocentesis via the subxiphoid area.  A total of 940 mL of bloody fluid was removed. Recommendations: Fluid was sent to the lab for analysis including cytology. Continue drainage to gravity and if output is less than 50 mL in 24 hours and echocardiogram shows resolution of pericardial effusion, the drain can be removed. I ordered her repeat limited echocardiogram to be done tomorrow. Recommend holding anticoagulation for at least 24 hours given bloody effusion.   ECHOCARDIOGRAM COMPLETE Result Date: 12/25/2023    ECHOCARDIOGRAM REPORT   Patient Name:   UBAH RADKE Date of Exam: 12/25/2023 Medical Rec #:  [id number]           Height:       69.0 in Accession #:    [id number]  Weight:       288.0 lb Date of Birth:  [birth year]          BSA:          2.412 m Patient Age:    58 years            BP:           127/90 mmHg Patient Gender: F                   HR:           86 bpm. Exam Location:  [facility] Procedure: 2D Echo, Cardiac Doppler, Color Doppler and Intracardiac            Opacification Agent (Both Spectral and Color Flow Doppler were            utilized during procedure). Indications:     Pericardial effusion I31.3  History:         Patient has prior history of Echocardiogram examinations, most                  recent 09/22/2022. Ovarian cancer, Arrythmias:Atrial                  Fibrillation; Signs/Symptoms:Dyspnea.  Sonographer:     Ashley McNeely-Sloane Referring Phys:  [id number] CARALYN HUDSON Diagnosing Phys: Marsa Dooms MD IMPRESSIONS  1. Left ventricular ejection fraction, by estimation, is 55 to 60%. The left ventricle has normal function. The left ventricle has no regional wall motion  abnormalities. Left ventricular diastolic parameters are consistent with Grade I diastolic dysfunction (impaired relaxation).  2. Right ventricular systolic function is normal. The right ventricular size is normal.  3. Large pericardial effusion. The pericardial effusion is circumferential. There is no evidence of cardiac tamponade.  4. The mitral valve is normal in structure. Trivial mitral valve regurgitation. No evidence of mitral stenosis.  5. The aortic valve is normal in structure. Aortic valve regurgitation is not visualized. No aortic stenosis is present.  6. The inferior vena cava is normal in size with greater than 50% respiratory variability, suggesting right atrial pressure of 3 mmHg. FINDINGS  Left Ventricle: Left ventricular ejection fraction, by estimation, is 55 to 60%. The left ventricle has normal function. The left ventricle has no regional wall motion abnormalities. Definity  contrast agent was given IV to delineate the left ventricular  endocardial borders. Strain was performed and the global longitudinal strain is indeterminate. The left ventricular internal cavity size was normal in size. There is no left ventricular hypertrophy. Left ventricular diastolic parameters are consistent with Grade I diastolic dysfunction (impaired relaxation). Right Ventricle: The right ventricular size is normal. No increase in right ventricular wall thickness. Right ventricular systolic function is normal. Left Atrium: Left atrial size was normal in size. Right Atrium: Right atrial size was normal in size. Pericardium: A large pericardial effusion is present. The pericardial effusion is circumferential. There is no evidence of cardiac tamponade. Mitral Valve: The mitral valve is normal in structure. Trivial mitral valve regurgitation. No evidence of mitral valve stenosis. MV peak gradient, 3.6 mmHg. The mean mitral valve gradient is 2.0 mmHg. Tricuspid Valve: The tricuspid valve is normal in structure. Tricuspid  valve regurgitation is trivial. No evidence of tricuspid stenosis. Aortic Valve: The aortic valve is normal in structure. Aortic valve regurgitation is not visualized. No aortic stenosis is present. Aortic valve mean gradient measures 3.0 mmHg. Aortic valve peak gradient measures 4.9 mmHg. Aortic valve area, by VTI measures 3.23  cm. Pulmonic Valve: The pulmonic valve was normal in structure. Pulmonic valve regurgitation is not visualized. No evidence of pulmonic stenosis. Aorta: The aortic root is normal in size and structure. Venous: The inferior vena cava is normal in size with greater than 50% respiratory variability, suggesting right atrial pressure of 3 mmHg. IAS/Shunts: No atrial level shunt detected by color flow Doppler. Additional Comments: 3D was performed not requiring image post processing on an independent workstation and was indeterminate.  LEFT VENTRICLE PLAX 2D LVIDd:         2.90 cm     Diastology LVIDs:         2.10 cm     LV e' medial:    6.31 cm/s LV PW:         2.20 cm     LV E/e' medial:  14.8 LV IVS:        1.30 cm     LV e' lateral:   5.44 cm/s LVOT diam:     2.10 cm     LV E/e' lateral: 17.1 LV SV:         59 LV SV Index:   24 LVOT Area:     3.46 cm  LV Volumes (MOD) LV vol d, MOD A2C: 77.9 ml LV vol s, MOD A2C: 20.5 ml LV SV MOD A2C:     57.4 ml RIGHT VENTRICLE             IVC RV S prime:     18.50 cm/s  IVC diam: 2.30 cm TAPSE (M-mode): 2.1 cm LEFT ATRIUM           Index        RIGHT ATRIUM           Index LA Vol (A4C): 71.1 ml 29.47 ml/m  RA Area:     22.70 cm                                    RA Volume:   68.80 ml  28.52 ml/m  AORTIC VALVE                    PULMONIC VALVE AV Area (Vmax):    3.32 cm     PV Vmax:        0.89 m/s AV Area (Vmean):   3.20 cm     PV Vmean:       63.700 cm/s AV Area (VTI):     3.23 cm     PV VTI:         0.158 m AV Vmax:           110.50 cm/s  PV Peak grad:   3.2 mmHg AV Vmean:          75.650 cm/s  PV Mean grad:   2.0 mmHg AV VTI:            0.181 m       RVOT Peak grad: 2 mmHg AV Peak Grad:      4.9 mmHg AV Mean Grad:      3.0 mmHg LVOT Vmax:         106.00 cm/s LVOT Vmean:        69.800 cm/s LVOT VTI:          0.169 m LVOT/AV VTI ratio: 0.93  AORTA Ao Root diam: 2.90 cm Ao Asc diam:  3.80 cm MITRAL VALVE MV  Area (PHT): 3.17 cm     SHUNTS MV Area VTI:   3.68 cm     Systemic VTI:  0.17 m MV Peak grad:  3.6 mmHg     Systemic Diam: 2.10 cm MV Mean grad:  2.0 mmHg     Pulmonic VTI:  0.119 m MV Vmax:       0.95 m/s MV Vmean:      58.9 cm/s MV Decel Time: 239 msec MV E velocity: 93.20 cm/s MV A velocity: 112.00 cm/s MV E/A ratio:  0.83 Marsa Dooms MD Electronically signed by Marsa Dooms MD Signature Date/Time: 12/25/2023/4:32:43 PM    Final    DG Chest 2 View Result Date: 12/24/2023 EXAM: 2 VIEW(S) XRAY OF THE CHEST 12/24/2023 06:38:21 PM COMPARISON: None available. CLINICAL HISTORY: shortness of breath FINDINGS: LINES, TUBES AND DEVICES: Right IJ approach Port-A-Cath in place with tip projecting at the superior right atrium. LUNGS AND PLEURA: No focal pulmonary opacity. No pleural effusion. No pneumothorax. HEART AND MEDIASTINUM: Cardiomegaly. Aortic atherosclerosis. BONES AND SOFT TISSUES: No acute osseous abnormality. IMPRESSION: 1. Right IJ approach Port-A-Cath with tip projecting at the superior right atrium. 2. Cardiomegaly. 3. Aortic atherosclerosis. Electronically signed by: Greig Pique MD 12/24/2023 06:51 PM EST RP Workstation: [id number]   CT CHEST ABDOMEN PELVIS W CONTRAST Result Date: 12/24/2023 EXAM: CT CHEST, ABDOMEN AND PELVIS WITH CONTRAST 12/24/2023 06:14:55 PM TECHNIQUE: CT of the chest, abdomen and pelvis was performed with the administration of 100 mL of iohexol  (OMNIPAQUE ) 300 MG/ML solution. Multiplanar reformatted images are provided for review. Automated exposure control, iterative reconstruction, and/or weight based adjustment of the mA/kV was utilized to reduce the radiation dose to as low as reasonably achievable.  COMPARISON: CT chest abdomen and pelvis 10/22/2023. CLINICAL HISTORY: ovarian cancer ovarian cancer FINDINGS: CHEST: MEDIASTINUM AND LYMPH NODES: There is a new large pericardial effusion. Right chest port catheter tip ends in the distal SVC. The central airways are clear. No mediastinal, hilar or axillary lymphadenopathy. LUNGS AND PLEURA: There is a new small right pleural effusion. There is a calcified granuloma in the right upper lobe. No focal consolidation or pulmonary edema. No pneumothorax. ABDOMEN AND PELVIS: LIVER: The liver is unremarkable. GALLBLADDER AND BILE DUCTS: The gallbladder is surgically absent. No biliary ductal dilatation. SPLEEN: No acute abnormality. PANCREAS: No acute abnormality. ADRENAL GLANDS: Low attenuation left adrenal adenoma or myelolipoma measures 3 cm; this is better seen on today's study when compared to the prior examination. The right adrenal gland appears normal. KIDNEYS, URETERS AND BLADDER: There is a small fat containing lesion in the left kidney posteriorly measuring 9 mm, likely angiomyolipoma. Subcentimeter hypodensity may represent a cyst in the inferior pole of the left kidney. There is a 5 mm fat containing lesion in the inferior pole of the right kidney which is unchanged, likely angiomyolipoma. No stones in the kidneys or ureters. No hydronephrosis. No perinephric or periureteral stranding. Urinary bladder is unremarkable. GI AND BOWEL: The appendix is prominent in size measuring up to 8 mm. There is some trace surrounding inflammatory stranding. There is no fluid collection or perforation. Stomach demonstrates no acute abnormality. There is no bowel obstruction. REPRODUCTIVE ORGANS: The uterus is surgically absent. The ovaries are not definitively seen. PERITONEUM AND RETROPERITONEUM: There is trace free fluid in the right upper quadrant which is new from prior. No free air. VASCULATURE: Aorta is normal in caliber. There are atherosclerotic calcifications of the  aorta. ABDOMINAL AND PELVIS LYMPH NODES: There are scattered nonenlarged retroperitoneal lymph nodes  which are similar to the prior study. BONES AND SOFT TISSUES: No acute osseous abnormality. No focal soft tissue abnormality. IMPRESSION: 1. New large pericardial effusion. 2. New small right pleural effusion. 3. Prominent appendix with trace surrounding inflammatory stranding, without fluid collection or perforation. Please correlate clinically for early acute appendicitis. 4. Low attenuation 3 cm left adrenal lesion, favored adenoma or myelolipoma. 5. No evidence of metastatic disease. Electronically signed by: Greig Pique MD 12/24/2023 06:42 PM EST RP Workstation: [id number]    Microbiology: Results for orders placed or performed during the hospital encounter of 12/24/23  Body fluid culture w Gram Stain     Status: None   Collection Time: 12/26/23  9:47 AM   Specimen: PATH Cytology Misc. fluid; Body Fluid  Result Value Ref Range Status   Specimen Description   Final    PERICARDIAL Performed at [facility], [address] [place], [place] [address]    Special Requests   Final    PERICARDIAL Performed at [facility], [address] [place], [place] [address]    Gram Stain   Final    FEW WBC PRESENT, PREDOMINANTLY MONONUCLEAR NO ORGANISMS SEEN    Culture   Final    NO GROWTH 3 DAYS Performed at [facility], [address] [place], [place] [address]    Report Status 12/29/2023 FINAL  Final  MRSA Next Gen by PCR, Nasal     Status: None   Collection Time: 12/26/23 10:50 AM   Specimen: Nasal Mucosa; Nasal Swab  Result Value Ref Range Status   MRSA by PCR Next Gen NOT DETECTED NOT DETECTED Final    Comment: (NOTE) The GeneXpert MRSA Assay (FDA approved for NASAL specimens only), is one component of a comprehensive MRSA colonization surveillance program. It is not intended to diagnose MRSA infection nor to guide or monitor treatment for MRSA  infections. Test performance is not FDA approved in patients less than 8 years old. Performed at [facility], [address] [place], [place] [address]     Labs: CBC: Recent Labs  Lab 12/27/23 [phone] 12/28/23 0429 12/28/23 1547 12/29/23 0403 12/30/23 0400  WBC 10.0 7.0 7.5 6.5 6.4  HGB 10.0* 8.9* 9.6* 9.1* 9.3*  HCT 31.9* 29.0* 30.6* 29.5* 30.3*  MCV 100.6* 100.7* 99.7 100.7* 100.7*  PLT 378 311 321 304 280   Basic Metabolic Panel: Recent Labs  Lab 12/26/23 0202 12/27/23 0346 12/28/23 0429 12/29/23 0403 12/30/23 0400  NA 133* 137 139 138 141  K 4.5 3.8 3.7 3.8 4.0  CL 98 102 103 100 102  CO2 22 28 30 31  32  GLUCOSE 199* 188* 188* 202* 123*  BUN 36* 27* 18 15 14   CREATININE 1.28* 1.02* 0.93 0.90 0.85  CALCIUM 8.9 8.3* 8.2* 8.3* 8.5*  MG 2.2  --   --   --   --    Liver Function Tests: Recent Labs  Lab 12/24/23 2128  AST 19  ALT 31  ALKPHOS 99  BILITOT 0.5  PROT 6.9  ALBUMIN 3.7   CBG: Recent Labs  Lab 12/29/23 1148 12/29/23 1633 12/29/23 2118 12/30/23 0729 12/30/23 1143  GLUCAP 206* 185* 176* 115* 158*    Discharge time spent: greater than 30 minutes.  Signed: Laree Lock, MD [facility] 12/30/2023 "

## 2023-12-30 NOTE — Progress Notes (Signed)
 " [facility] CLINIC CARDIOLOGY PROGRESS NOTE       Patient ID: Nancy [name] MRN: [id number] DOB/AGE: 1965/08/21 58 y.o.  Admit date: 12/24/2023 Referring Physician Dr. Delayne Solian Primary Physician Orlean Alan HERO, FNP  Primary Cardiologist none Reason for Consultation pericardial effusion  HPI: Nancy [name] is a 58 y.o. female  with a past medical history of HTN, DM , left BKA, asthma, morbid obesity, NICM (EF 70% 12/08/2023), stage 3 ovarian CA on maintenance chemotherapy, currently on hold due to chemotherapy induced diarrhea who presented to the ED on 12/24/2023 for abnormal CT results which showed large pericardial effusion. Cardiology was consulted for further evaluation.   Interval history: - Patient seen and examined this morning, resting in hospital bed.  - States her breathing is stable. Only minimal abdominal pain. - Remaining in NSR on tele.  - No obvious drainage on chest dressing after pericardial drain removal yesterday.  Review of systems complete and found to be negative unless listed above    Past Medical History:  Diagnosis Date   Abnormal cardiovascular stress test 09/21/2018   Formatting of this note might be different from the original. Lexiscan Myoview 09/16/2018 revealed mild anterior ischemia   Adverse effect of motion 05/10/2014   AKI (acute kidney injury) 10/22/2022   Anxiety    Arthritis    r knee   Asthma    Breast cyst 05/10/2014   CAD (coronary artery disease)    a.) LHC 06/04/2009 at [facility]; non-obstructive CAD. b.) CTA with FFR 10/08/2018: extensive mixed plaque proximal to mid LAD (51-69%); Coronary Ca score 217; FFR 0.71 dPDA, 0.86 mLAD, 0.87 dLCx.   CCF (congestive cardiac failure) (HCC) 06/06/2008   a.) 30% EF. b.) TTE 06/03/2011: EF >55%; triv MR. c.) TTE 04/27/2018: EF 55%; mild LVH; triv PR, mild MR/TR; G1DD.   Cellulitis of left lower extremity 09/18/2022   Chest pain with high risk for cardiac etiology 07/02/2016   Chronic  use of opiate drug for therapeutic purpose 02/13/2022   Formatting of this note might be different from the original. [facility] Pain Contract signed on 04/18/16 & updated 03/04/17; UDS done on 04/18/16.   Complication of anesthesia    Diabetic foot infection (HCC) 03/13/2015   Diabetic foot ulcer (HCC) 02/08/2021   Diabetic ulcer of left heel associated with type 2 diabetes mellitus (HCC) 02/08/2021   Eczema    Family history of adverse reaction to anesthesia    a.) PONV in mother and grandmother   Gas gangrene (HCC) 09/18/2022   GERD (gastroesophageal reflux disease) 05/10/2014   History of kidney stones    History of MSSA bacteremia due to osteomyelitis L foot 09/18/22 10/22/2022   HLD (hyperlipidemia)    Hyponatremia 03/13/2015   Migraines    Motion sickness    MSSA bacteremia 09/20/2022   Osteomyelitis (HCC) 09/18/2022   Other acute osteomyelitis, left ankle and foot (HCC) 02/13/2022   Panic attacks    Peripheral edema 04/27/2018   Pneumonia    PONV (postoperative nausea and vomiting)    Sepsis secondary to diabetic foot infection 03/13/2015   Sepsis secondary to diabetic foot infection 03/13/2015   T2DM (type 2 diabetes mellitus) (HCC)    Takotsubo cardiomyopathy / transient apical balooning syndrome / stress-induced cardiomyopathy 05/10/2014   Unspecified essential hypertension     Past Surgical History:  Procedure Laterality Date   AMPUTATION Left 09/19/2022   Procedure: AMPUTATION BELOW KNEE;  Surgeon: Jama Cordella MATSU, MD;  Location: [facility] ORS;  Service: Vascular;  Laterality: Left;   AMPUTATION Left 09/24/2022   Procedure: AMPUTATION BELOW KNEE WITH WOUND CLOSURE;  Surgeon: Jama Cordella MATSU, MD;  Location: [facility];  Service: Vascular;  Laterality: Left;   AMPUTATION TOE Left 03/16/2015   Procedure: left fifth toe amputation with incision and drainage;  Surgeon: Eva Gay, DPM;  Location: [facility];  Service: Podiatry;  Laterality: Left;   APPLICATION OF WOUND VAC Left  02/14/2021   Procedure: APPLICATION OF WOUND VAC;  Surgeon: Gay Eva, DPM;  Location: [facility];  Service: Podiatry;  Laterality: Left;   APPLICATION OF WOUND VAC Left 04/16/2021   Procedure: APPLICATION OF WOUND VAC;  Surgeon: Lowery Estefana RAMAN, DO;  Location: [facility];  Service: Plastics;  Laterality: Left;   CARDIAC CATHETERIZATION Left 06/04/2009   Procedure: CARDIAC CATHETERIZATION; Location: [facility]   CHOLECYSTECTOMY     DEBRIDEMENT AND CLOSURE WOUND Left 04/16/2021   Procedure: DEBRIDEMENT AND CLOSURE WOUND;  Surgeon: Lowery Estefana RAMAN, DO;  Location: [facility];  Service: Plastics;  Laterality: Left;  1 hour   IR IMAGING GUIDED PORT INSERTION  11/18/2022   IRRIGATION AND DEBRIDEMENT FOOT Left 02/08/2021   Procedure: IRRIGATION AND DEBRIDEMENT FOOT - LFT HEEL ULCER;  Surgeon: Gay Eva, DPM;  Location: [facility];  Service: Podiatry;  Laterality: Left;   IRRIGATION AND DEBRIDEMENT FOOT Left 02/14/2021   Procedure: IRRIGATION AND DEBRIDEMENT LEFT HEEL;  Surgeon: Gay Eva, DPM;  Location: [facility];  Service: Podiatry;  Laterality: Left;   kidney stone removal     KNEE ARTHROSCOPY W/ MENISCAL REPAIR     NOSE SURGERY  01/07/1989   due to fracture   PERICARDIOCENTESIS N/A 12/26/2023   Procedure: PERICARDIOCENTESIS;  Surgeon: Darron Deatrice LABOR, MD;  Location: [facility];  Service: Cardiovascular;  Laterality: N/A;   TEE WITHOUT CARDIOVERSION N/A 09/26/2022   Procedure: TRANSESOPHAGEAL ECHOCARDIOGRAM;  Surgeon: Dewane Shiner, DO;  Location: [facility];  Service: Cardiovascular;  Laterality: N/A;   TOE AMPUTATION     second toe   TONSILLECTOMY     TOTAL KNEE ARTHROPLASTY  01/11/2011   Procedure: TOTAL KNEE ARTHROPLASTY;  Surgeon: Norleen LITTIE Gavel;  Location: [facility];  Service: Orthopedics;  Laterality: Right;  COMPUTER ASSISTED TOTAL KNEE REPLACEMENT    Medications Prior to Admission  Medication Sig Dispense Refill Last  Dose/Taking   acidophilus (RISAQUAD) CAPS capsule Take 1 capsule by mouth daily.   12/24/2023 Morning   carvedilol  (COREG ) 6.25 MG tablet TAKE 1 TABLET BY MOUTH TWICE DAILY 180 tablet 1 12/24/2023 Morning   colchicine  0.6 MG tablet Take 0.6 mg by mouth 2 (two) times daily.   12/24/2023 Morning   desvenlafaxine  (PRISTIQ ) 100 MG 24 hr tablet Take 1 tablet (100 mg total) by mouth daily. 90 tablet 3 12/23/2023 Evening   insulin  NPH-regular Human (70-30) 100 UNIT/ML injection Inject 70 Units into the skin 2 (two) times daily with a meal. 50 mL 10 12/24/2023   montelukast  (SINGULAIR ) 10 MG tablet Take 1 tablet (10 mg total) by mouth daily. 90 tablet 1 12/23/2023 Evening   Multiple Vitamin (MULTIVITAMIN WITH MINERALS) TABS tablet Take 1 tablet by mouth daily. 30 tablet 2 12/24/2023 Morning   pantoprazole  (PROTONIX ) 40 MG tablet Take 1 tablet (40 mg total) by mouth daily. 90 tablet 3 12/23/2023 Evening   albuterol  (PROVENTIL  HFA;VENTOLIN  HFA) 108 (90 Base) MCG/ACT inhaler Inhale 2 puffs into the lungs every 4 (four) hours as needed for wheezing or shortness of breath. Reported on 03/29/2015 (  Patient not taking: No sig reported) 1 Inhaler 3 Not Taking   albuterol  (PROVENTIL ) (2.5 MG/3ML) 0.083% nebulizer solution Take 2.5 mg by nebulization as needed. (Patient not taking: No sig reported)   Not Taking   ALPRAZolam  (XANAX ) 0.5 MG tablet Take 0.5-1 tablets (0.25-0.5 mg total) by mouth 2 (two) times daily as needed for anxiety. Take 1/2 to 1 tablet by mouth as needed for panic attacks (Patient not taking: No sig reported) 60 tablet 2 Not Taking   aspirin  EC 81 MG tablet Take 81 mg by mouth daily. (Patient not taking: No sig reported)   Not Taking   Continuous Glucose Sensor (FREESTYLE LIBRE 2 SENSOR) MISC 1 each by Does not apply route every 14 (fourteen) days. (Patient not taking: No sig reported) 2 each 3 Not Taking   diphenoxylate -atropine  (LOMOTIL ) 2.5-0.025 MG tablet Take 1 tablet by mouth 4 (four) times daily  as needed for diarrhea or loose stools. (Patient not taking: No sig reported) 30 tablet 0 Not Taking   Ensure Max Protein (ENSURE MAX PROTEIN) LIQD Take 330 mLs (11 oz total) by mouth 2 (two) times daily.      fenofibrate  54 MG tablet TAKE 1 TABLET BY MOUTH EVERY DAY (Patient not taking: No sig reported) 30 tablet 11 Not Taking   gabapentin  (NEURONTIN ) 300 MG capsule TAKE 1 CAPSULE BY MOUTH EVERY NIGHT AT BEDTIME (Patient not taking: No sig reported) 60 capsule 0 Not Taking   ibuprofen (ADVIL) 800 MG tablet TAKE 1 TABLET BY MOUTH EVERY 8 HOURS AS NEEDED (Patient not taking: No sig reported) 90 tablet 3 Not Taking   lidocaine  4 % Place 1 patch onto the skin daily as needed (for pain). (Patient not taking: Reported on 12/24/2023)   Not Taking   lidocaine -prilocaine  (EMLA ) cream Apply to affected area once (Patient not taking: Reported on 12/24/2023) 30 g 3 Not Taking   lisinopril  (ZESTRIL ) 10 MG tablet Take 1 tablet (10 mg total) by mouth every morning AND 0.5 tablets (5 mg total) at bedtime. (Patient not taking: No sig reported) 145 tablet 3 Not Taking   lisinopril  (ZESTRIL ) 5 MG tablet TAKE 1 TABLET BY MOUTH TWICE DAILY (Patient not taking: No sig reported) 180 tablet 1 Not Taking   metFORMIN  (GLUCOPHAGE ) 500 MG tablet TAKE 2 TABLET BY MOUTH IN THE MORNING AND 3 TABLET BY MOUTH IN THE EVENING (Patient not taking: No sig reported) 450 tablet 1 Not Taking   methocarbamol  (ROBAXIN ) 500 MG tablet Take 1 tablet (500 mg total) by mouth every 8 (eight) hours as needed for muscle spasms. (Patient not taking: No sig reported) 30 tablet 0 Not Taking   mirtazapine  (REMERON ) 7.5 MG tablet TAKE 1 TABLET BY MOUTH EVERY NIGHT AT BEDTIME (Patient not taking: No sig reported) 90 tablet 2 Not Taking   naloxone  (NARCAN ) nasal spray 4 mg/0.1 mL SPRAY 1 SPRAY INTO ONE NOSTRIL AS DIRECTED FOR OPIOID OVERDOSE (TURN PERSON ON SIDE AFTER DOSE. IF NO RESPONSE IN 2-3 MINUTES OR PERSON RESPONDS BUT RELAPSES, REPEAT USING A NEW  SPRAY DEVICE AND SPRAY INTO THE OTHER NOSTRIL. CALL 911 AFTER USE.) * EMERGENCY USE ONLY * (Patient not taking: No sig reported) 1 each 0 Not Taking   nystatin  (MYCOSTATIN /NYSTOP ) powder APPLY TOPICALLY TWICE DAILY AS NEEDED (Patient not taking: No sig reported) 30 g 3 Not Taking   nystatin  ointment (MYCOSTATIN ) Apply 1 Application topically 2 (two) times daily. (Patient not taking: No sig reported) 30 g 0 Not Taking   olaparib  (LYNPARZA )  100 MG tablet Take 1 tablet (100 mg total) by mouth 2 (two) times daily. Swallow whole. May take with food to decrease nausea and vomiting. Take with 150mg  tablet (Patient not taking: No sig reported) 60 tablet 0 Not Taking   olaparib  (LYNPARZA ) 150 MG tablet Take 1 tablet (150 mg total) by mouth 2 (two) times daily. Swallow whole. May take with food to decrease nausea and vomiting. Take with 100 mg tablet (Patient not taking: No sig reported) 60 tablet 0 Not Taking   ondansetron  (ZOFRAN ) 8 MG tablet Take 1 tablet (8 mg total) by mouth every 8 (eight) hours as needed for nausea or vomiting. Start on the third day after carboplatin . (Patient not taking: No sig reported) 30 tablet 1 Not Taking   ondansetron  (ZOFRAN -ODT) 8 MG disintegrating tablet Take 1 tablet (8 mg total) by mouth every 8 (eight) hours as needed for nausea or vomiting. (Patient not taking: No sig reported) 90 tablet 3 Not Taking   pravastatin  (PRAVACHOL ) 40 MG tablet TAKE 1 TABLET BY MOUTH EVERY DAY (Patient not taking: No sig reported) 30 tablet 5 Not Taking   prochlorperazine  (COMPAZINE ) 10 MG tablet Take 1 tablet (10 mg total) by mouth every 6 (six) hours as needed for nausea or vomiting. (Patient not taking: No sig reported) 90 tablet 1 Not Taking   tirzepatide  (MOUNJARO ) 7.5 MG/0.5ML Pen Inject 7.5 mg into the skin once a week. (Patient not taking: No sig reported) 2 mL 4 Not Taking   Ubrogepant  (UBRELVY ) 100 MG TABS Take 1 tablet (100 mg total) by mouth as needed (migraine). (Patient not taking: No  sig reported) 12 tablet 1 Not Taking   zinc  sulfate 220 (50 Zn) MG capsule Take 1 capsule (220 mg total) by mouth daily. (Patient not taking: No sig reported) 30 capsule 0 Not Taking   Social History   Socioeconomic History   Marital status: Married    Spouse name: Not on file   Number of children: Not on file   Years of education: Not on file   Highest education level: Not on file  Occupational History   Not on file  Tobacco Use   Smoking status: Former    Current packs/day: 0.00    Types: Cigarettes    Quit date: 03/12/1985    Years since quitting: 38.8   Smokeless tobacco: Never  Vaping Use   Vaping status: Never Used  Substance and Sexual Activity   Alcohol use: No   Drug use: No   Sexual activity: Yes    Birth control/protection: Post-menopausal  Other Topics Concern   Not on file  Social History Narrative   Wife, Barnie, at bedside. One indoor pet, dog.   Social Drivers of Health   Tobacco Use: Medium Risk (12/24/2023)   Patient History    Smoking Tobacco Use: Former    Smokeless Tobacco Use: Never    Passive Exposure: Not on file  Financial Resource Strain: Medium Risk (12/01/2023)   Received from [facility]   Overall Financial Resource Strain (CARDIA)    Difficulty of Paying Living Expenses: Somewhat hard  Food Insecurity: No Food Insecurity (12/26/2023)   Epic    Worried About Radiation Protection Practitioner of Food in the Last Year: Never true    Ran Out of Food in the Last Year: Never true  Transportation Needs: No Transportation Needs (12/26/2023)   Epic    Lack of Transportation (Medical): No    Lack of Transportation (Non-Medical): No  Recent Concern:  Transportation Needs - Unmet Transportation Needs (12/01/2023)   Received [facility] - [facility]    In the past 12 months, has lack of transportation kept you from medical appointments or from getting medications?: Yes    Lack of Transportation (Non-Medical): Yes   Physical Activity: Not on file  Stress: Not on file  Social Connections: Not on file  Intimate Partner Violence: Not At Risk (12/27/2023)   Epic    Fear of Current or Ex-Partner: No    Emotionally Abused: No    Physically Abused: No    Sexually Abused: No  Depression (PHQ2-9): Low Risk (11/04/2023)   Depression (PHQ2-9)    PHQ-2 Score: 1  Alcohol Screen: Low Risk (12/24/2023)   Alcohol Screen    Last Alcohol Screening Score (AUDIT): 0  Housing: Low Risk (12/27/2023)   Epic    Unable to Pay for Housing in the Last Year: No    Number of Times Moved in the Last Year: 0    Homeless in the Last Year: No  Utilities: Not At Risk (12/27/2023)   Epic    Threatened with loss of utilities: No  Health Literacy: Not on file    Family History  Problem Relation Age of Onset   Transient ischemic attack Mother    Asthma Mother    Hypertension Mother    Lung cancer Father 41   Hypertension Father    Asthma Sister    Anxiety disorder Sister    Asthma Sister    Hypertension Brother    Ovarian cancer Maternal Aunt 81   Melanoma Maternal Uncle        dx 50s   Lung cancer Paternal Aunt 18   Stomach cancer Maternal Grandmother    Colon cancer Maternal Grandmother    Lymphoma Maternal Grandmother    Melanoma Maternal Grandfather 72       sun exposure   Lung cancer Paternal Grandmother 45   Kidney cancer Paternal Grandfather        dx early   Crohn's disease Other    GER disease Other    Breast cancer Neg Hx      Vitals:   12/30/23 0300 12/30/23 0400 12/30/23 0500 12/30/23 0608  BP:  (!) 152/97  125/67  Pulse: 60 60 (!) 58 (!) 56  Resp: 11 11 11 11   Temp:  98.5 F (36.9 C)    TempSrc:  Oral    SpO2: 92% 94% 91% 95%  Weight:      Height:        PHYSICAL EXAM General: Ill-appearing female, well nourished, in no acute distress. HEENT: Normocephalic and atraumatic. Neck: No JVD.  Lungs: Normal respiratory effort on room air. Clear bilaterally to auscultation. No wheezes,  crackles, rhonchi.  Heart: HRRR. Normal S1 and S2 without gallops or murmurs.  Abdomen: Non-distended appearing.  Msk: Normal strength and tone for age. Extremities: Warm and well perfused. No clubbing, cyanosis.  No edema.  Neuro: Alert and oriented X 3. Psych: Answers questions appropriately.   Labs: Basic Metabolic Panel: Recent Labs    12/29/23 0403 12/30/23 0400  NA 138 141  K 3.8 4.0  CL 100 102  CO2 31 32  GLUCOSE 202* 123*  BUN 15 14  CREATININE 0.90 0.85  CALCIUM 8.3* 8.5*   Liver Function Tests: No results for input(s): AST, ALT, ALKPHOS, BILITOT, PROT, ALBUMIN in the last 72 hours.  No results for input(s): LIPASE, AMYLASE in the last 72  hours. CBC: Recent Labs    12/29/23 0403 12/30/23 0400  WBC 6.5 6.4  HGB 9.1* 9.3*  HCT 29.5* 30.3*  MCV 100.7* 100.7*  PLT 304 280   Cardiac Enzymes: No results for input(s): CKTOTAL, CKMB, CKMBINDEX, TROPONINIHS in the last 72 hours. BNP: No results for input(s): BNP in the last 72 hours. D-Dimer: No results for input(s): DDIMER in the last 72 hours. Hemoglobin A1C: No results for input(s): HGBA1C in the last 72 hours. Fasting Lipid Panel: No results for input(s): CHOL, HDL, LDLCALC, TRIG, CHOLHDL, LDLDIRECT in the last 72 hours. Thyroid  Function Tests: No results for input(s): TSH, T4TOTAL, T3FREE, THYROIDAB in the last 72 hours.  Invalid input(s): FREET3 Anemia Panel: Recent Labs    12/29/23 0403  VITAMINB12 1,313*  FOLATE 16.3     Radiology: ECHOCARDIOGRAM LIMITED Result Date: 12/28/2023    ECHOCARDIOGRAM LIMITED REPORT   Patient Name:   Nancy [name] Date of Exam: 12/28/2023 Medical Rec #:  [id number]           Height:       69.0 in Accession #:    [id number]          Weight:       288.0 lb Date of Birth:  27-Dec-1965          BSA:          2.412 m Patient Age:    58 years            BP:           151/65 mmHg Patient Gender: F                   HR:            66 bpm. Exam Location:  [facility] Procedure: 2D Echo and Cardiac Doppler (Both Spectral and Color Flow Doppler            were utilized during procedure). Indications:     Pericardial effusion I31.3  History:         Patient has prior history of Echocardiogram examinations, most                  recent 12/26/2023.  Sonographer:     Thedora Louder RDCS, FASE Referring Phys:  [id number] FLYJFFJI A ARIDA Diagnosing Phys: Denyse Bathe  Sonographer Comments: Technically difficult study due to poor echo windows. Image acquisition challenging due to patient body habitus. This study was performed with patient supine. IMPRESSIONS  1. Left ventricular ejection fraction, by estimation, is 55 to 60%. The left ventricle has normal function. Left ventricular diastolic function could not be evaluated.  2. Left atrial size was mild to moderately dilated.  3. Right atrial size was mild to moderately dilated.  4. A small pericardial effusion is present. The pericardial effusion is circumferential. There is no evidence of cardiac tamponade.  5. The mitral valve was not assessed.  6. The aortic valve was not assessed. FINDINGS  Left Ventricle: Left ventricular ejection fraction, by estimation, is 55 to 60%. The left ventricle has normal function. Left ventricular diastolic function could not be evaluated. Left Atrium: Left atrial size was mild to moderately dilated. Right Atrium: Right atrial size was mild to moderately dilated. Pericardium: A small pericardial effusion is present. The pericardial effusion is circumferential. There is no evidence of cardiac tamponade. Mitral Valve: The mitral valve was not assessed. Tricuspid Valve: The tricuspid valve is not assessed. Aortic Valve: The aortic valve was not  assessed. Additional Comments: Spectral Doppler performed.  LEFT VENTRICLE PLAX 2D LVIDd:         4.40 cm LVIDs:         3.10 cm LV PW:         1.30 cm LV IVS:        1.30 cm  LEFT ATRIUM         Index LA diam:    3.90 cm 1.62 cm/m  Denyse Bathe Electronically signed by Denyse Bathe Signature Date/Time: 12/28/2023/10:51:10 AM    Final    CT ABDOMEN PELVIS WO CONTRAST Result Date: 12/27/2023 CLINICAL DATA:  Nonlocalized abdominal pain. Hypertension. Below-the-knee amputation. Morbid obesity. Stage III ovarian cancer on chemotherapy. Pericardial effusion. * Tracking Code: BO * EXAM: CT ABDOMEN AND PELVIS WITHOUT CONTRAST TECHNIQUE: Multidetector CT imaging of the abdomen and pelvis was performed following the standard protocol without IV contrast. RADIATION DOSE REDUCTION: This exam was performed according to the departmental dose-optimization program which includes automated exposure control, adjustment of the mA and/or kV according to patient size and/or use of iterative reconstruction technique. COMPARISON:  12/24/2023 FINDINGS: Lower chest: Bibasilar atelectasis. Decrease in tiny pericardial effusion. Increase in small right and new small left pleural effusions. Compressive bibasilar atelectasis. Hepatobiliary: Moderate hepatic steatosis. Hepatomegaly at 18.6 cm craniocaudal. Cholecystectomy, without biliary ductal dilatation. Pancreas: Normal, without mass or ductal dilatation. Spleen: Normal in size, without focal abnormality. Adrenals/Urinary Tract: Normal right adrenal gland. Fat density left adrenal nodule of 2.7 cm is consistent with a benign myelolipoma. Punctate left renal collecting system calculus. Right renal vascular calcification. No hydronephrosis. No hydroureter or ureteric calculi. No bladder calculi. Stomach/Bowel: Normal stomach, without wall thickening. Dense contrast within the colon, which is normal in caliber. Normal terminal ileum. The appendix measures up to 7 mm, without surrounding inflammation on image 67/5. Normal small bowel. Vascular/Lymphatic: Advanced aortic and branch vessel atherosclerosis. Small abdominal retroperitoneal nodes, none pathologic by size criteria. Reproductive: Hysterectomy.  No adnexal  mass. Other: No significant free fluid. Mild pelvic floor laxity. No evidence of omental or peritoneal disease. Mild anasarca. Musculoskeletal: Lumbosacral spondylosis. IMPRESSION: 1. Normal appendix, without acute process in the abdomen or pelvis. 2. No evidence of metastatic disease, status post hysterectomy. 3. Decreased tiny pericardial effusion. Increased right and new left pleural effusions, both small. 4. Left nephrolithiasis 5. Hepatic steatosis 6.  Aortic Atherosclerosis (ICD10-I70.0). Electronically Signed   By: Rockey Kilts M.D.   On: 12/27/2023 15:16   ECHOCARDIOGRAM LIMITED Result Date: 12/26/2023    ECHOCARDIOGRAM LIMITED REPORT   Patient Name:   Nancy [name] Date of Exam: 12/26/2023 Medical Rec #:  [id number]           Height:       69.0 in Accession #:    [id number]          Weight:       288.0 lb Date of Birth:  08-02-1965          BSA:          2.412 m Patient Age:    58 years            BP:           145/90 mmHg Patient Gender: F                   HR:           70 bpm. Exam Location:  [facility] Procedure: Limited Echo, Color Doppler and Saline Contrast Bubble Study (Both  Spectral and Color Flow Doppler were utilized during procedure). Indications:     Pericardial effusion I31.3  History:         Patient has prior history of Echocardiogram examinations, most                  recent 12/25/2023.  Sonographer:     Ashley McNeely-Sloane Referring Phys:  [id number] Tambria Pfannenstiel Diagnosing Phys: Deatrice Cage MD IMPRESSIONS  1. Left ventricular ejection fraction, by estimation, is 55 to 60%. The left ventricle has normal function. The left ventricle has no regional wall motion abnormalities. Left ventricular diastolic function could not be evaluated.  2. Right ventricular systolic function is normal. The right ventricular size is normal.  3. Large pericardial effusion initially noted. post pericardiocentesis, effusion is small with no tamponade. Agitated saline was injected via the  catheter which confirmed position in pericardial space.  4. The mitral valve was not well visualized. No evidence of mitral valve regurgitation. No evidence of mitral stenosis.  5. The aortic valve was not well visualized. Aortic valve regurgitation is not visualized. No aortic stenosis is present. FINDINGS  Left Ventricle: Left ventricular ejection fraction, by estimation, is 55 to 60%. The left ventricle has normal function. The left ventricle has no regional wall motion abnormalities. The left ventricular internal cavity size was normal in size. There is  no left ventricular hypertrophy. Left ventricular diastolic function could not be evaluated. Right Ventricle: The right ventricular size is normal. No increase in right ventricular wall thickness. Right ventricular systolic function is normal. Left Atrium: Left atrial size was normal in size. Right Atrium: Right atrial size was normal in size. Pericardium: A large pericardial effusion is present. There is no evidence of cardiac tamponade. Mitral Valve: The mitral valve was not well visualized. Mild mitral annular calcification. No evidence of mitral valve stenosis. Tricuspid Valve: The tricuspid valve is not well visualized. Tricuspid valve regurgitation is not demonstrated. No evidence of tricuspid stenosis. Aortic Valve: The aortic valve was not well visualized. Aortic valve regurgitation is not visualized. No aortic stenosis is present. Pulmonic Valve: The pulmonic valve was not well visualized. Pulmonic valve regurgitation is not visualized. No evidence of pulmonic stenosis. Aorta: The aortic root is normal in size and structure. IAS/Shunts: No atrial level shunt detected by color flow Doppler. Agitated saline contrast was given intravenously to evaluate for intracardiac shunting. Deatrice Cage MD Electronically signed by Deatrice Cage MD Signature Date/Time: 12/26/2023/2:38:28 PM    Final    CARDIAC CATHETERIZATION Result Date: 12/26/2023 Successful  pericardiocentesis via the subxiphoid area.  A total of 940 mL of bloody fluid was removed. Recommendations: Fluid was sent to the lab for analysis including cytology. Continue drainage to gravity and if output is less than 50 mL in 24 hours and echocardiogram shows resolution of pericardial effusion, the drain can be removed. I ordered her repeat limited echocardiogram to be done tomorrow. Recommend holding anticoagulation for at least 24 hours given bloody effusion.   ECHOCARDIOGRAM COMPLETE Result Date: 12/25/2023    ECHOCARDIOGRAM REPORT   Patient Name:   Nancy [name] Date of Exam: 12/25/2023 Medical Rec #:  [id number]           Height:       69.0 in Accession #:    [id number]          Weight:       288.0 lb Date of Birth:  [birth year]          BSA:  2.412 m Patient Age:    58 years            BP:           127/90 mmHg Patient Gender: F                   HR:           86 bpm. Exam Location:  [facility] Procedure: 2D Echo, Cardiac Doppler, Color Doppler and Intracardiac            Opacification Agent (Both Spectral and Color Flow Doppler were            utilized during procedure). Indications:     Pericardial effusion I31.3  History:         Patient has prior history of Echocardiogram examinations, most                  recent 09/22/2022. Ovarian cancer, Arrythmias:Atrial                  Fibrillation; Signs/Symptoms:Dyspnea.  Sonographer:     Ashley McNeely-Sloane Referring Phys:  [id number] Jovoni Borkenhagen Diagnosing Phys: Marsa Dooms MD IMPRESSIONS  1. Left ventricular ejection fraction, by estimation, is 55 to 60%. The left ventricle has normal function. The left ventricle has no regional wall motion abnormalities. Left ventricular diastolic parameters are consistent with Grade I diastolic dysfunction (impaired relaxation).  2. Right ventricular systolic function is normal. The right ventricular size is normal.  3. Large pericardial effusion. The pericardial effusion is circumferential. There  is no evidence of cardiac tamponade.  4. The mitral valve is normal in structure. Trivial mitral valve regurgitation. No evidence of mitral stenosis.  5. The aortic valve is normal in structure. Aortic valve regurgitation is not visualized. No aortic stenosis is present.  6. The inferior vena cava is normal in size with greater than 50% respiratory variability, suggesting right atrial pressure of 3 mmHg. FINDINGS  Left Ventricle: Left ventricular ejection fraction, by estimation, is 55 to 60%. The left ventricle has normal function. The left ventricle has no regional wall motion abnormalities. Definity  contrast agent was given IV to delineate the left ventricular  endocardial borders. Strain was performed and the global longitudinal strain is indeterminate. The left ventricular internal cavity size was normal in size. There is no left ventricular hypertrophy. Left ventricular diastolic parameters are consistent with Grade I diastolic dysfunction (impaired relaxation). Right Ventricle: The right ventricular size is normal. No increase in right ventricular wall thickness. Right ventricular systolic function is normal. Left Atrium: Left atrial size was normal in size. Right Atrium: Right atrial size was normal in size. Pericardium: A large pericardial effusion is present. The pericardial effusion is circumferential. There is no evidence of cardiac tamponade. Mitral Valve: The mitral valve is normal in structure. Trivial mitral valve regurgitation. No evidence of mitral valve stenosis. MV peak gradient, 3.6 mmHg. The mean mitral valve gradient is 2.0 mmHg. Tricuspid Valve: The tricuspid valve is normal in structure. Tricuspid valve regurgitation is trivial. No evidence of tricuspid stenosis. Aortic Valve: The aortic valve is normal in structure. Aortic valve regurgitation is not visualized. No aortic stenosis is present. Aortic valve mean gradient measures 3.0 mmHg. Aortic valve peak gradient measures 4.9 mmHg. Aortic  valve area, by VTI measures 3.23 cm. Pulmonic Valve: The pulmonic valve was normal in structure. Pulmonic valve regurgitation is not visualized. No evidence of pulmonic stenosis. Aorta: The aortic root is normal in size and structure. Venous: The  inferior vena cava is normal in size with greater than 50% respiratory variability, suggesting right atrial pressure of 3 mmHg. IAS/Shunts: No atrial level shunt detected by color flow Doppler. Additional Comments: 3D was performed not requiring image post processing on an independent workstation and was indeterminate.  LEFT VENTRICLE PLAX 2D LVIDd:         2.90 cm     Diastology LVIDs:         2.10 cm     LV e' medial:    6.31 cm/s LV PW:         2.20 cm     LV E/e' medial:  14.8 LV IVS:        1.30 cm     LV e' lateral:   5.44 cm/s LVOT diam:     2.10 cm     LV E/e' lateral: 17.1 LV SV:         59 LV SV Index:   24 LVOT Area:     3.46 cm  LV Volumes (MOD) LV vol d, MOD A2C: 77.9 ml LV vol s, MOD A2C: 20.5 ml LV SV MOD A2C:     57.4 ml RIGHT VENTRICLE             IVC RV S prime:     18.50 cm/s  IVC diam: 2.30 cm TAPSE (M-mode): 2.1 cm LEFT ATRIUM           Index        RIGHT ATRIUM           Index LA Vol (A4C): 71.1 ml 29.47 ml/m  RA Area:     22.70 cm                                    RA Volume:   68.80 ml  28.52 ml/m  AORTIC VALVE                    PULMONIC VALVE AV Area (Vmax):    3.32 cm     PV Vmax:        0.89 m/s AV Area (Vmean):   3.20 cm     PV Vmean:       63.700 cm/s AV Area (VTI):     3.23 cm     PV VTI:         0.158 m AV Vmax:           110.50 cm/s  PV Peak grad:   3.2 mmHg AV Vmean:          75.650 cm/s  PV Mean grad:   2.0 mmHg AV VTI:            0.181 m      RVOT Peak grad: 2 mmHg AV Peak Grad:      4.9 mmHg AV Mean Grad:      3.0 mmHg LVOT Vmax:         106.00 cm/s LVOT Vmean:        69.800 cm/s LVOT VTI:          0.169 m LVOT/AV VTI ratio: 0.93  AORTA Ao Root diam: 2.90 cm Ao Asc diam:  3.80 cm MITRAL VALVE MV Area (PHT): 3.17 cm     SHUNTS MV  Area VTI:   3.68 cm     Systemic VTI:  0.17 m MV Peak grad:  3.6 mmHg  Systemic Diam: 2.10 cm MV Mean grad:  2.0 mmHg     Pulmonic VTI:  0.119 m MV Vmax:       0.95 m/s MV Vmean:      58.9 cm/s MV Decel Time: 239 msec MV E velocity: 93.20 cm/s MV A velocity: 112.00 cm/s MV E/A ratio:  0.83 Marsa Dooms MD Electronically signed by Marsa Dooms MD Signature Date/Time: 12/25/2023/4:32:43 PM    Final    DG Chest 2 View Result Date: 12/24/2023 EXAM: 2 VIEW(S) XRAY OF THE CHEST 12/24/2023 06:38:21 PM COMPARISON: None available. CLINICAL HISTORY: shortness of breath FINDINGS: LINES, TUBES AND DEVICES: Right IJ approach Port-A-Cath in place with tip projecting at the superior right atrium. LUNGS AND PLEURA: No focal pulmonary opacity. No pleural effusion. No pneumothorax. HEART AND MEDIASTINUM: Cardiomegaly. Aortic atherosclerosis. BONES AND SOFT TISSUES: No acute osseous abnormality. IMPRESSION: 1. Right IJ approach Port-A-Cath with tip projecting at the superior right atrium. 2. Cardiomegaly. 3. Aortic atherosclerosis. Electronically signed by: Greig Pique MD 12/24/2023 06:51 PM EST RP Workstation: [id number]   CT CHEST ABDOMEN PELVIS W CONTRAST Result Date: 12/24/2023 EXAM: CT CHEST, ABDOMEN AND PELVIS WITH CONTRAST 12/24/2023 06:14:55 PM TECHNIQUE: CT of the chest, abdomen and pelvis was performed with the administration of 100 mL of iohexol  (OMNIPAQUE ) 300 MG/ML solution. Multiplanar reformatted images are provided for review. Automated exposure control, iterative reconstruction, and/or weight based adjustment of the mA/kV was utilized to reduce the radiation dose to as low as reasonably achievable. COMPARISON: CT chest abdomen and pelvis 10/22/2023. CLINICAL HISTORY: ovarian cancer ovarian cancer FINDINGS: CHEST: MEDIASTINUM AND LYMPH NODES: There is a new large pericardial effusion. Right chest port catheter tip ends in the distal SVC. The central airways are clear. No mediastinal, hilar or  axillary lymphadenopathy. LUNGS AND PLEURA: There is a new small right pleural effusion. There is a calcified granuloma in the right upper lobe. No focal consolidation or pulmonary edema. No pneumothorax. ABDOMEN AND PELVIS: LIVER: The liver is unremarkable. GALLBLADDER AND BILE DUCTS: The gallbladder is surgically absent. No biliary ductal dilatation. SPLEEN: No acute abnormality. PANCREAS: No acute abnormality. ADRENAL GLANDS: Low attenuation left adrenal adenoma or myelolipoma measures 3 cm; this is better seen on today's study when compared to the prior examination. The right adrenal gland appears normal. KIDNEYS, URETERS AND BLADDER: There is a small fat containing lesion in the left kidney posteriorly measuring 9 mm, likely angiomyolipoma. Subcentimeter hypodensity may represent a cyst in the inferior pole of the left kidney. There is a 5 mm fat containing lesion in the inferior pole of the right kidney which is unchanged, likely angiomyolipoma. No stones in the kidneys or ureters. No hydronephrosis. No perinephric or periureteral stranding. Urinary bladder is unremarkable. GI AND BOWEL: The appendix is prominent in size measuring up to 8 mm. There is some trace surrounding inflammatory stranding. There is no fluid collection or perforation. Stomach demonstrates no acute abnormality. There is no bowel obstruction. REPRODUCTIVE ORGANS: The uterus is surgically absent. The ovaries are not definitively seen. PERITONEUM AND RETROPERITONEUM: There is trace free fluid in the right upper quadrant which is new from prior. No free air. VASCULATURE: Aorta is normal in caliber. There are atherosclerotic calcifications of the aorta. ABDOMINAL AND PELVIS LYMPH NODES: There are scattered nonenlarged retroperitoneal lymph nodes which are similar to the prior study. BONES AND SOFT TISSUES: No acute osseous abnormality. No focal soft tissue abnormality. IMPRESSION: 1. New large pericardial effusion. 2. New small right pleural  effusion. 3. Prominent appendix  with trace surrounding inflammatory stranding, without fluid collection or perforation. Please correlate clinically for early acute appendicitis. 4. Low attenuation 3 cm left adrenal lesion, favored adenoma or myelolipoma. 5. No evidence of metastatic disease. Electronically signed by: Greig Pique MD 12/24/2023 06:42 PM EST RP Workstation: [id number]    ECHO as above  TELEMETRY (personally reviewed): Normal sinus rhythm rate 60-70s  EKG (personally reviewed): sinus tach rate 101 bpm  Data reviewed by me 12/30/2023: last 24h vitals tele labs imaging I/O ED provider note, admission H&P, hospitalist progress note  Principal Problem:   Pericardial effusion, without tamponade Active Problems:   Essential hypertension   Asthma without status asthmaticus   Neuropathy, diabetic (HCC)   Migraine without aura and without status migrainosus, not intractable   Diabetes mellitus, type 2 (HCC)   S/P left  BKA 09/24/22 secondary to osteomyelitis (below knee amputation) (HCC)   Obesity, Class III, BMI 40-49.9 (morbid obesity) (HCC)   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 female  with a past medical history of HTN, DM , left BKA, asthma, morbid obesity, NICM (EF 70% 12/08/2023), stage 3 ovarian CA on maintenance chemotherapy, currently on hold due to chemotherapy induced diarrhea who presented to the ED on 12/24/2023 for abnormal CT results which showed large pericardial effusion. Cardiology was consulted for further evaluation.   # Atrial fibrillation RVR # New onset atrial fibrillation # Large pericardial effusion # Ovarian cancer Patient was sent to the ED for  evaluation of pericardial effusion which was noted on CT chest yesterday to be large.  This was originally diagnosed during hospitalization last month at [facility], echo at that time reportedly showed moderate pericardial effusion.  She is in atrial fibrillation RVR on telemetry 12/25/2023 and converted to normal sinus rhythm later that day.  Echo 12/25/2023 with large pericardial effusion, EF 55-60% with no evidence of diastolic collapse or tamponade.-Plan for pericardiocentesis this a.m. given large pericardial effusion, patient has remained hemodynamically stable.  Will plan to send fluid for cytology given her cancer diagnosis. - S/p pericardial drain removal with no noted drainage on dressing this AM. Bedside echo this morning without much change in size of effusion since drain removal.  - Continue eliquis  5 mg twice daily.  - Continue PO amiodarone  load with 400 mg twice daily for 5 more days followed by 200 mg daily. - Further management per primary team.  Ok for discharge today from a cardiac perspective. Will arrange for follow up in clinic with Tinnie Maiden, NP in 1-2 weeks.    This patient's plan of care was discussed and created with Dr. Florencio and he is in agreement.  Signed: Danita Bloch, PA-C  12/30/2023, 7:54 AM [facility]      "

## 2023-12-31 ENCOUNTER — Other Ambulatory Visit: Payer: Self-pay

## 2023-12-31 ENCOUNTER — Ambulatory Visit: Admitting: Family

## 2023-12-31 ENCOUNTER — Encounter: Payer: Self-pay | Admitting: Oncology

## 2023-12-31 NOTE — Telephone Encounter (Signed)
 Please move up lab on 1/13 to be 3-4 days prior to MD.   Keep GYN appt as scheduled.

## 2024-01-05 ENCOUNTER — Other Ambulatory Visit: Payer: Self-pay | Admitting: Family

## 2024-01-05 ENCOUNTER — Ambulatory Visit

## 2024-01-07 ENCOUNTER — Other Ambulatory Visit

## 2024-01-07 ENCOUNTER — Other Ambulatory Visit: Payer: Self-pay

## 2024-01-08 ENCOUNTER — Encounter: Payer: Self-pay | Admitting: Oncology

## 2024-01-08 LAB — LIPID PANEL
Chol/HDL Ratio: 4.5 ratio — ABNORMAL HIGH (ref 0.0–4.4)
Cholesterol, Total: 154 mg/dL (ref 100–199)
HDL: 34 mg/dL — ABNORMAL LOW
LDL Chol Calc (NIH): 91 mg/dL (ref 0–99)
Triglycerides: 167 mg/dL — ABNORMAL HIGH (ref 0–149)
VLDL Cholesterol Cal: 29 mg/dL (ref 5–40)

## 2024-01-08 LAB — CMP14+EGFR
ALT: 17 IU/L (ref 0–32)
AST: 18 IU/L (ref 0–40)
Albumin: 3.9 g/dL (ref 3.8–4.9)
Alkaline Phosphatase: 80 IU/L (ref 49–135)
BUN/Creatinine Ratio: 25 — ABNORMAL HIGH (ref 9–23)
BUN: 24 mg/dL (ref 6–24)
Bilirubin Total: 0.2 mg/dL (ref 0.0–1.2)
CO2: 20 mmol/L (ref 20–29)
Calcium: 9.3 mg/dL (ref 8.7–10.2)
Chloride: 102 mmol/L (ref 96–106)
Creatinine, Ser: 0.97 mg/dL (ref 0.57–1.00)
Globulin, Total: 3 g/dL (ref 1.5–4.5)
Glucose: 169 mg/dL — ABNORMAL HIGH (ref 70–99)
Potassium: 4.7 mmol/L (ref 3.5–5.2)
Sodium: 140 mmol/L (ref 134–144)
Total Protein: 6.9 g/dL (ref 6.0–8.5)
eGFR: 67 mL/min/1.73

## 2024-01-08 LAB — CBC WITH DIFFERENTIAL/PLATELET
Basophils Absolute: 0.1 x10E3/uL (ref 0.0–0.2)
Basos: 1 %
EOS (ABSOLUTE): 0.4 x10E3/uL (ref 0.0–0.4)
Eos: 5 %
Hematocrit: 35.9 % (ref 34.0–46.6)
Hemoglobin: 11.4 g/dL (ref 11.1–15.9)
Immature Grans (Abs): 0 x10E3/uL (ref 0.0–0.1)
Immature Granulocytes: 0 %
Lymphocytes Absolute: 2.5 x10E3/uL (ref 0.7–3.1)
Lymphs: 26 %
MCH: 31.1 pg (ref 26.6–33.0)
MCHC: 31.8 g/dL (ref 31.5–35.7)
MCV: 98 fL — ABNORMAL HIGH (ref 79–97)
Monocytes Absolute: 0.8 x10E3/uL (ref 0.1–0.9)
Monocytes: 8 %
Neutrophils Absolute: 5.5 x10E3/uL (ref 1.4–7.0)
Neutrophils: 60 %
Platelets: 400 x10E3/uL (ref 150–450)
RBC: 3.67 x10E6/uL — ABNORMAL LOW (ref 3.77–5.28)
RDW: 15.2 % (ref 11.7–15.4)
WBC: 9.3 x10E3/uL (ref 3.4–10.8)

## 2024-01-08 LAB — HEMOGLOBIN A1C
Est. average glucose Bld gHb Est-mCnc: 183 mg/dL
Hgb A1c MFr Bld: 8 % — ABNORMAL HIGH (ref 4.8–5.6)

## 2024-01-08 LAB — VITAMIN D 25 HYDROXY (VIT D DEFICIENCY, FRACTURES): Vit D, 25-Hydroxy: 29 ng/mL — ABNORMAL LOW (ref 30.0–100.0)

## 2024-01-08 LAB — VITAMIN B12: Vitamin B-12: 670 pg/mL (ref 232–1245)

## 2024-01-13 ENCOUNTER — Ambulatory Visit: Admitting: Family

## 2024-01-13 ENCOUNTER — Encounter: Payer: Self-pay | Admitting: Family

## 2024-01-13 VITALS — BP 150/90 | HR 72 | Ht 69.0 in | Wt 276.8 lb

## 2024-01-13 DIAGNOSIS — Z89512 Acquired absence of left leg below knee: Secondary | ICD-10-CM

## 2024-01-13 DIAGNOSIS — F331 Major depressive disorder, recurrent, moderate: Secondary | ICD-10-CM | POA: Insufficient documentation

## 2024-01-13 DIAGNOSIS — E66813 Obesity, class 3: Secondary | ICD-10-CM

## 2024-01-13 DIAGNOSIS — C786 Secondary malignant neoplasm of retroperitoneum and peritoneum: Secondary | ICD-10-CM

## 2024-01-13 DIAGNOSIS — I509 Heart failure, unspecified: Secondary | ICD-10-CM

## 2024-01-13 DIAGNOSIS — E1142 Type 2 diabetes mellitus with diabetic polyneuropathy: Secondary | ICD-10-CM

## 2024-01-13 DIAGNOSIS — J449 Chronic obstructive pulmonary disease, unspecified: Secondary | ICD-10-CM

## 2024-01-13 DIAGNOSIS — I7025 Atherosclerosis of native arteries of other extremities with ulceration: Secondary | ICD-10-CM

## 2024-01-14 ENCOUNTER — Encounter: Payer: Self-pay | Admitting: Family

## 2024-01-14 NOTE — Assessment & Plan Note (Signed)
 Patient stable.  Well controlled with current therapy.   Continue current meds.

## 2024-01-14 NOTE — Assessment & Plan Note (Signed)
 Checking labs today. Will call pt. With results  Continue current diabetes POC, as patient has been well controlled on current regimen.  Will adjust meds if needed based on labs.   -CBC w/Diff -CMP w/eGFR -Hemoglobin A1C

## 2024-01-14 NOTE — Assessment & Plan Note (Signed)
 Blood pressure well controlled with current medications.  Continue current therapy.  Will reassess at follow up.   - CBC w/Diff - CMP w/eGFR

## 2024-01-14 NOTE — Assessment & Plan Note (Signed)
 Checking labs today.  Will continue supplements as needed.   - Vitamin D  - Vitamin B12 - TSH

## 2024-01-14 NOTE — Assessment & Plan Note (Signed)
 Checking labs today.  Continue current therapy for lipid control. Will modify as needed based on labwork results.   -CMP w/eGFR -Lipid Panel

## 2024-01-14 NOTE — Progress Notes (Signed)
 "  Established Patient Office Visit  Subjective:  Patient ID: Nancy [name], female    DOB: [birth year]  Age: 59 y.o. MRN: [id number]  Chief Complaint  Patient presents with   Follow-up    Patient is here today for her 3 months follow up.  She has been feeling fairly well since last appointment.   She does not have additional concerns to discuss today.  Labs are due today.  She needs refills.   I have reviewed her active problem list, medication list, health maintenance, notes from last encounter, lab results for her appointment today.     No other concerns at this time.   Past Medical History:  Diagnosis Date   Abnormal cardiovascular stress test 09/21/2018   Formatting of this note might be different from the original. Lexiscan Myoview 09/16/2018 revealed mild anterior ischemia   Adverse effect of motion 05/10/2014   AKI (acute kidney injury) 10/22/2022   Anxiety    Arthritis    r knee   Asthma    Breast cyst 05/10/2014   CAD (coronary artery disease)    a.) LHC 06/04/2009 at [facility]; non-obstructive CAD. b.) CTA with FFR 10/08/2018: extensive mixed plaque proximal to mid LAD (51-69%); Coronary Ca score 217; FFR 0.71 dPDA, 0.86 mLAD, 0.87 dLCx.   CCF (congestive cardiac failure) (HCC) 06/06/2008   a.) 30% EF. b.) TTE 06/03/2011: EF >55%; triv MR. c.) TTE 04/27/2018: EF 55%; mild LVH; triv PR, mild MR/TR; G1DD.   Cellulitis of left lower extremity 09/18/2022   Chest pain with high risk for cardiac etiology 07/02/2016   Chronic use of opiate drug for therapeutic purpose 02/13/2022   Formatting of this note might be different from the original. [facility] Pain Contract signed on 04/18/16 & updated 03/04/17; UDS done on 04/18/16.   Complication of anesthesia    Diabetic foot infection (HCC) 03/13/2015   Diabetic foot ulcer (HCC) 02/08/2021   Diabetic ulcer of left heel associated with type 2 diabetes mellitus (HCC) 02/08/2021   Eczema    Family history of adverse reaction to  anesthesia    a.) PONV in mother and grandmother   Gas gangrene (HCC) 09/18/2022   GERD (gastroesophageal reflux disease) 05/10/2014   History of kidney stones    History of MSSA bacteremia due to osteomyelitis L foot 09/18/22 10/22/2022   HLD (hyperlipidemia)    Hyponatremia 03/13/2015   Migraines    Motion sickness    MSSA bacteremia 09/20/2022   Osteomyelitis (HCC) 09/18/2022   Other acute osteomyelitis, left ankle and foot (HCC) 02/13/2022   Panic attacks    Peripheral edema 04/27/2018   Pneumonia    PONV (postoperative nausea and vomiting)    Sepsis secondary to diabetic foot infection 03/13/2015   Sepsis secondary to diabetic foot infection 03/13/2015   T2DM (type 2 diabetes mellitus) (HCC)    Takotsubo cardiomyopathy / transient apical balooning syndrome / stress-induced cardiomyopathy 05/10/2014   Unspecified essential hypertension     Past Surgical History:  Procedure Laterality Date   AMPUTATION Left 09/19/2022   Procedure: AMPUTATION BELOW KNEE;  Surgeon: Jama Cordella MATSU, MD;  Location: [facility];  Service: Vascular;  Laterality: Left;   AMPUTATION Left 09/24/2022   Procedure: AMPUTATION BELOW KNEE WITH WOUND CLOSURE;  Surgeon: Jama Cordella MATSU, MD;  Location: [facility];  Service: Vascular;  Laterality: Left;   AMPUTATION TOE Left 03/16/2015   Procedure: left fifth toe amputation with incision and drainage;  Surgeon: Eva Gay, DPM;  Location: [facility];  Service: Podiatry;  Laterality: Left;   APPLICATION OF WOUND VAC Left 02/14/2021   Procedure: APPLICATION OF WOUND VAC;  Surgeon: Ashley Soulier, DPM;  Location: [facility];  Service: Podiatry;  Laterality: Left;   APPLICATION OF WOUND VAC Left 04/16/2021   Procedure: APPLICATION OF WOUND VAC;  Surgeon: Lowery Estefana RAMAN, DO;  Location: [facility];  Service: Plastics;  Laterality: Left;   CARDIAC CATHETERIZATION Left 06/04/2009   Procedure: CARDIAC CATHETERIZATION; Location: [facility]    CHOLECYSTECTOMY     DEBRIDEMENT AND CLOSURE WOUND Left 04/16/2021   Procedure: DEBRIDEMENT AND CLOSURE WOUND;  Surgeon: Lowery Estefana RAMAN, DO;  Location: [facility];  Service: Plastics;  Laterality: Left;  1 hour   IR IMAGING GUIDED PORT INSERTION  11/18/2022   IRRIGATION AND DEBRIDEMENT FOOT Left 02/08/2021   Procedure: IRRIGATION AND DEBRIDEMENT FOOT - LFT HEEL ULCER;  Surgeon: Ashley Soulier, DPM;  Location: [facility];  Service: Podiatry;  Laterality: Left;   IRRIGATION AND DEBRIDEMENT FOOT Left 02/14/2021   Procedure: IRRIGATION AND DEBRIDEMENT LEFT HEEL;  Surgeon: Ashley Soulier, DPM;  Location: [facility];  Service: Podiatry;  Laterality: Left;   kidney stone removal     KNEE ARTHROSCOPY W/ MENISCAL REPAIR     NOSE SURGERY  01/07/1989   due to fracture   PERICARDIOCENTESIS N/A 12/26/2023   Procedure: PERICARDIOCENTESIS;  Surgeon: Darron Deatrice LABOR, MD;  Location: [facility];  Service: Cardiovascular;  Laterality: N/A;   TEE WITHOUT CARDIOVERSION N/A 09/26/2022   Procedure: TRANSESOPHAGEAL ECHOCARDIOGRAM;  Surgeon: Dewane Shiner, DO;  Location: [facility];  Service: Cardiovascular;  Laterality: N/A;   TOE AMPUTATION     second toe   TONSILLECTOMY     TOTAL KNEE ARTHROPLASTY  01/11/2011   Procedure: TOTAL KNEE ARTHROPLASTY;  Surgeon: Norleen LITTIE Gavel;  [facility];  Service: Orthopedics;  Laterality: Right;  COMPUTER ASSISTED TOTAL KNEE REPLACEMENT    Social History   Socioeconomic History   Marital status: Married    Spouse name: Not on file   Number of children: Not on file   Years of education: Not on file   Highest education level: Not on file  Occupational History   Not on file  Tobacco Use   Smoking status: Former    Current packs/day: 0.00    Types: Cigarettes    Quit date: 03/12/1985    Years since quitting: 38.8   Smokeless tobacco: Never  Vaping Use   Vaping status: Never Used  Substance and Sexual Activity   Alcohol use: No   Drug use: No    Sexual activity: Yes    Birth control/protection: Post-menopausal  Other Topics Concern   Not on file  Social History Narrative   Wife, Barnie, at bedside. One indoor pet, dog.   Social Drivers of Health   Tobacco Use: Medium Risk (01/13/2024)   Patient History    Smoking Tobacco Use: Former    Smokeless Tobacco Use: Never    Passive Exposure: Not on file  Financial Resource Strain: Medium Risk (01/07/2024)   Received from [facility]   Overall Financial Resource Strain (CARDIA)    Difficulty of Paying Living Expenses: Somewhat hard  Food Insecurity: No Food Insecurity (01/07/2024)   Received from [facility]   Epic    Within the past 12 months, you worried that your food would run out before you got the money to buy more.: Never true    Within the past  12 months, the food you bought just didn't last and you didn't have money to get more.: Never true  Transportation Needs: No Transportation Needs (01/07/2024)   Received from [facility] - Transportation    In the past 12 months, has lack of transportation kept you from medical appointments or from getting medications?: No    Lack of Transportation (Non-Medical): No  Recent Concern: Transportation Needs - Unmet Transportation Needs (12/01/2023)   Received from [facility] - Transportation    In the past 12 months, has lack of transportation kept you from medical appointments or from getting medications?: Yes    Lack of Transportation (Non-Medical): Yes  Physical Activity: Not on file  Stress: Not on file  Social Connections: Not on file  Intimate Partner Violence: Not At Risk (12/27/2023)   Epic    Fear of Current or Ex-Partner: No    Emotionally Abused: No    Physically Abused: No    Sexually Abused: No  Depression (PHQ2-9): Low Risk (11/04/2023)   Depression (PHQ2-9)    PHQ-2 Score: 1  Alcohol Screen: Low Risk (12/24/2023)    Alcohol Screen    Last Alcohol Screening Score (AUDIT): 0  Housing: Low Risk  (01/07/2024)   Received from [facility]   Epic    In the last 12 months, was there a time when you were not able to pay the mortgage or rent on time?: No    In the past 12 months, how many times have you moved where you were living?: 0    At any time in the past 12 months, were you homeless or living in a shelter (including now)?: No  Utilities: Not At Risk (01/07/2024)   Received from [facility]   Epic    In the past 12 months has the electric, gas, oil, or water  company threatened to shut off services in your home?: No  Health Literacy: Not on file    Family History  Problem Relation Age of Onset   Transient ischemic attack Mother    Asthma Mother    Hypertension Mother    Lung cancer Father 16   Hypertension Father    Asthma Sister    Anxiety disorder Sister    Asthma Sister    Hypertension Brother    Ovarian cancer Maternal Aunt 66   Melanoma Maternal Uncle        dx 32s   Lung cancer Paternal Aunt 48   Stomach cancer Maternal Grandmother    Colon cancer Maternal Grandmother    Lymphoma Maternal Grandmother    Melanoma Maternal Grandfather 72       sun exposure   Lung cancer Paternal Grandmother 74   Kidney cancer Paternal Grandfather        dx early   Crohn's disease Other    GER disease Other    Breast cancer Neg Hx     Allergies[1]  Review of Systems  All other systems reviewed and are negative.      Objective:   BP 118/70   Pulse 75   Ht 5' 9 (1.753 m)   Wt 287 lb 6.4 oz (130.4 kg)   LMP 02/08/2013   SpO2 98%   BMI 42.44 kg/m   Vitals:   11/06/23 0926  BP: 118/70  Pulse: 75  Height: 5' 9 (1.753 m)  Weight: 287 lb 6.4 oz (130.4 kg)  SpO2: 98%  BMI (Calculated):  42.42    Physical Exam Vitals and nursing note reviewed.  Constitutional:      Appearance: Normal appearance. She is obese.  HENT:     Head: Normocephalic.   Eyes:     Extraocular Movements: Extraocular movements intact.     Conjunctiva/sclera: Conjunctivae normal.     Pupils: Pupils are equal, round, and reactive to light.  Cardiovascular:     Rate and Rhythm: Normal rate.  Pulmonary:     Effort: Pulmonary effort is normal.  Neurological:     General: No focal deficit present.     Mental Status: She is alert and oriented to person, place, and time. Mental status is at baseline.  Psychiatric:        Mood and Affect: Mood normal.        Behavior: Behavior normal.        Thought Content: Thought content normal.        Judgment: Judgment normal.      Results for orders placed or performed in visit on 11/06/23  Lipid Profile  Result Value Ref Range   Cholesterol, Total 154 100 - 199 mg/dL   Triglycerides 832 (H) 0 - 149 mg/dL   HDL 34 (L) >60 mg/dL   VLDL Cholesterol Cal 29 5 - 40 mg/dL   LDL Chol Calc (NIH) 91 0 - 99 mg/dL   Chol/HDL Ratio 4.5 (H) 0.0 - 4.4 ratio  Vitamin D  (25 hydroxy)  Result Value Ref Range   Vit D, 25-Hydroxy 29.0 (L) 30.0 - 100.0 ng/mL  Vitamin B12  Result Value Ref Range   Vitamin B-12 670 232 - 1,245 pg/mL  Hemoglobin A1c  Result Value Ref Range   Hgb A1c MFr Bld 8.0 (H) 4.8 - 5.6 %   Est. average glucose Bld gHb Est-mCnc 183 mg/dL  CBC with Diff  Result Value Ref Range   WBC 9.3 3.4 - 10.8 x10E3/uL   RBC 3.67 (L) 3.77 - 5.28 x10E6/uL   Hemoglobin 11.4 11.1 - 15.9 g/dL   Hematocrit 64.0 65.9 - 46.6 %   MCV 98 (H) 79 - 97 fL   MCH 31.1 26.6 - 33.0 pg   MCHC 31.8 31.5 - 35.7 g/dL   RDW 84.7 88.2 - 84.5 %   Platelets 400 150 - 450 x10E3/uL   Neutrophils 60 Not Estab. %   Lymphs 26 Not Estab. %   Monocytes 8 Not Estab. %   Eos 5 Not Estab. %   Basos 1 Not Estab. %   Neutrophils Absolute 5.5 1.4 - 7.0 x10E3/uL   Lymphocytes Absolute 2.5 0.7 - 3.1 x10E3/uL   Monocytes Absolute 0.8 0.1 - 0.9 x10E3/uL   EOS (ABSOLUTE) 0.4 0.0 - 0.4 x10E3/uL   Basophils Absolute 0.1 0.0 - 0.2 x10E3/uL   Immature  Granulocytes 0 Not Estab. %   Immature Grans (Abs) 0.0 0.0 - 0.1 x10E3/uL  CMP14+EGFR  Result Value Ref Range   Glucose 169 (H) 70 - 99 mg/dL   BUN 24 6 - 24 mg/dL   Creatinine, Ser 9.02 0.57 - 1.00 mg/dL   eGFR 67 >40 fO/fpw/8.26   BUN/Creatinine Ratio 25 (H) 9 - 23   Sodium 140 134 - 144 mmol/L   Potassium 4.7 3.5 - 5.2 mmol/L   Chloride 102 96 - 106 mmol/L   CO2 20 20 - 29 mmol/L   Calcium 9.3 8.7 - 10.2 mg/dL   Total Protein 6.9 6.0 - 8.5 g/dL   Albumin 3.9 3.8 - 4.9 g/dL  Globulin, Total 3.0 1.5 - 4.5 g/dL   Bilirubin Total 0.2 0.0 - 1.2 mg/dL   Alkaline Phosphatase 80 49 - 135 IU/L   AST 18 0 - 40 IU/L   ALT 17 0 - 32 IU/L  POC CREATINE & ALBUMIN,URINE  Result Value Ref Range   Microalbumin Ur, POC 80 mg/L   Creatinine, POC 200 mg/dL   Albumin/Creatinine Ratio, Urine, POC 30-300     Recent Results (from the past 2160 hours)  Magnesium      Status: None   Collection Time: 11/04/23 12:40 PM  Result Value Ref Range   Magnesium  1.8 1.7 - 2.4 mg/dL    Comment: Performed at [facility], [address] [place], [place] [address]  CBC with Differential (Cancer Center Only)     Status: Abnormal   Collection Time: 11/04/23 12:40 PM  Result Value Ref Range   WBC Count 10.2 4.0 - 10.5 K/uL   RBC 3.46 (L) 3.87 - 5.11 MIL/uL   Hemoglobin 11.0 (L) 12.0 - 15.0 g/dL   HCT 66.5 (L) 63.9 - 53.9 %   MCV 96.5 80.0 - 100.0 fL   MCH 31.8 26.0 - 34.0 pg   MCHC 32.9 30.0 - 36.0 g/dL   RDW 84.3 (H) 88.4 - 84.4 %   Platelet Count 252 150 - 400 K/uL   nRBC 0.2 0.0 - 0.2 %   Neutrophils Relative % 64 %   Neutro Abs 6.5 1.7 - 7.7 K/uL   Lymphocytes Relative 24 %   Lymphs Abs 2.4 0.7 - 4.0 K/uL   Monocytes Relative 6 %   Monocytes Absolute 0.6 0.1 - 1.0 K/uL   Eosinophils Relative 4 %   Eosinophils Absolute 0.4 0.0 - 0.5 K/uL   Basophils Relative 1 %   Basophils Absolute 0.1 0.0 - 0.1 K/uL   Immature Granulocytes 1 %   Abs Immature Granulocytes 0.11 (H) 0.00 - 0.07 K/uL     Comment: Performed at [facility], [address] [place], [place] [address]  CMP (Cancer Center only)     Status: Abnormal   Collection Time: 11/04/23 12:40 PM  Result Value Ref Range   Sodium 137 135 - 145 mmol/L   Potassium 4.3 3.5 - 5.1 mmol/L   Chloride 102 98 - 111 mmol/L   CO2 24 22 - 32 mmol/L   Glucose, Bld 158 (H) 70 - 99 mg/dL    Comment: Glucose reference range applies only to samples taken after fasting for at least 8 hours.   BUN 30 (H) 6 - 20 mg/dL   Creatinine 8.76 (H) 9.55 - 1.00 mg/dL   Calcium 9.0 8.9 - 89.6 mg/dL   Total Protein 7.0 6.5 - 8.1 g/dL   Albumin 3.6 3.5 - 5.0 g/dL   AST 22 15 - 41 U/L   ALT 16 0 - 44 U/L   Alkaline Phosphatase 46 38 - 126 U/L   Total Bilirubin 0.4 0.0 - 1.2 mg/dL   GFR, Estimated 51 (L) >60 mL/min    Comment: (NOTE) Calculated using the CKD-EPI Creatinine Equation (2021)    Anion gap 11 5 - 15    Comment: Performed at [facility], [address] [place], [place] [address]  CA 125     Status: None   Collection Time: 11/04/23 12:40 PM  Result Value Ref Range   Cancer Antigen (CA) 125 5.7 0.0 - 38.1 U/mL    Comment: (NOTE) Roche Diagnostics Electrochemiluminescence Immunoassay (ECLIA) Values obtained with different assay  methods or kits cannot be used interchangeably.  Results cannot be interpreted as absolute evidence of the presence or absence of malignant disease. Performed At: [facility] [address] [place], [place] [id number] Jennette Shorter MD Ey:[id number]   POC CREATINE & ALBUMIN,URINE     Status: Abnormal   Collection Time: 11/06/23 10:34 AM  Result Value Ref Range   Microalbumin Ur, POC 80 mg/L   Creatinine, POC 200 mg/dL   Albumin/Creatinine Ratio, Urine, POC 30-300   VAS US  ABI WITH/WO TBI     Status: None   Collection Time: 11/10/23  3:11 PM  Result Value Ref Range   Right ABI Non compressible    Left ABI BKA   Magnesium      Status: None   Collection Time: 12/22/23  1:08 PM   Result Value Ref Range   Magnesium  2.1 1.7 - 2.4 mg/dL    Comment: Performed at [facility], [address] [place], [place] [address]  CMP (Cancer Center only)     Status: Abnormal   Collection Time: 12/22/23  1:08 PM  Result Value Ref Range   Sodium 137 135 - 145 mmol/L   Potassium 4.4 3.5 - 5.1 mmol/L   Chloride 102 98 - 111 mmol/L   CO2 23 22 - 32 mmol/L   Glucose, Bld 163 (H) 70 - 99 mg/dL    Comment: Glucose reference range applies only to samples taken after fasting for at least 8 hours.   BUN 23 (H) 6 - 20 mg/dL   Creatinine 9.09 9.55 - 1.00 mg/dL   Calcium 9.4 8.9 - 89.6 mg/dL   Total Protein 7.5 6.5 - 8.1 g/dL   Albumin 4.1 3.5 - 5.0 g/dL   AST 21 15 - 41 U/L   ALT 31 0 - 44 U/L   Alkaline Phosphatase 92 38 - 126 U/L   Total Bilirubin 0.4 0.0 - 1.2 mg/dL   GFR, Estimated >39 >39 mL/min    Comment: (NOTE) Calculated using the CKD-EPI Creatinine Equation (2021)    Anion gap 13 5 - 15    Comment: Performed at [facility], [address] [place], [place] [address]  CBC with Differential (Cancer Center Only)     Status: Abnormal   Collection Time: 12/22/23  1:08 PM  Result Value Ref Range   WBC Count 8.4 4.0 - 10.5 K/uL   RBC 3.11 (L) 3.87 - 5.11 MIL/uL   Hemoglobin 9.9 (L) 12.0 - 15.0 g/dL   HCT 68.0 (L) 63.9 - 53.9 %   MCV 102.6 (H) 80.0 - 100.0 fL   MCH 31.8 26.0 - 34.0 pg   MCHC 31.0 30.0 - 36.0 g/dL   RDW 83.1 (H) 88.4 - 84.4 %   Platelet Count 499 (H) 150 - 400 K/uL   nRBC 0.0 0.0 - 0.2 %   Neutrophils Relative % 53 %   Neutro Abs 4.5 1.7 - 7.7 K/uL   Lymphocytes Relative 30 %   Lymphs Abs 2.5 0.7 - 4.0 K/uL   Monocytes Relative 10 %   Monocytes Absolute 0.8 0.1 - 1.0 K/uL   Eosinophils Relative 5 %   Eosinophils Absolute 0.5 0.0 - 0.5 K/uL   Basophils Relative 1 %   Basophils Absolute 0.1 0.0 - 0.1 K/uL   Immature Granulocytes 1 %   Abs Immature Granulocytes 0.04 0.00 - 0.07 K/uL    Comment: Performed at [facility], [address] [place], [place] [address]  CA 125  Status: Abnormal   Collection Time: 12/22/23  1:08 PM  Result Value Ref Range   Cancer Antigen (CA) 125 78.1 (H) 0.0 - 38.1 U/mL    Comment: (NOTE) Roche Diagnostics Electrochemiluminescence Immunoassay (ECLIA) Values obtained with different assay methods or kits cannot be used interchangeably.  Results cannot be interpreted as absolute evidence of the presence or absence of malignant disease. Performed At: [facility] [address] [place], [place] [id number] Jennette Shorter MD Ey:[id number]   Basic metabolic panel     Status: Abnormal   Collection Time: 12/24/23  6:23 PM  Result Value Ref Range   Sodium 131 (L) 135 - 145 mmol/L   Potassium 4.4 3.5 - 5.1 mmol/L   Chloride 96 (L) 98 - 111 mmol/L   CO2 23 22 - 32 mmol/L   Glucose, Bld 188 (H) 70 - 99 mg/dL    Comment: Glucose reference range applies only to samples taken after fasting for at least 8 hours.   BUN 27 (H) 6 - 20 mg/dL   Creatinine, Ser 9.11 0.44 - 1.00 mg/dL   Calcium 8.8 (L) 8.9 - 10.3 mg/dL   GFR, Estimated >39 >39 mL/min    Comment: (NOTE) Calculated using the CKD-EPI Creatinine Equation (2021)    Anion gap 13 5 - 15    Comment: Performed at [facility], [address] [place], [place] [address]  CBC     Status: Abnormal   Collection Time: 12/24/23  6:23 PM  Result Value Ref Range   WBC 9.6 4.0 - 10.5 K/uL   RBC 3.12 (L) 3.87 - 5.11 MIL/uL   Hemoglobin 10.0 (L) 12.0 - 15.0 g/dL   HCT 67.9 (L) 63.9 - 53.9 %   MCV 102.6 (H) 80.0 - 100.0 fL   MCH 32.1 26.0 - 34.0 pg   MCHC 31.3 30.0 - 36.0 g/dL   RDW 83.2 (H) 88.4 - 84.4 %   Platelets 492 (H) 150 - 400 K/uL   nRBC 0.0 0.0 - 0.2 %    Comment: Performed at [facility], [address] [place], [place] [address]  Troponin T, High Sensitivity     Status: None   Collection Time: 12/24/23  6:23 PM  Result Value Ref Range   Troponin T High Sensitivity 19 0 - 19 ng/L    Comment:  (NOTE) Biotin concentrations > 1000 ng/mL falsely decrease TnT results.  Serial cardiac troponin measurements are suggested.  Refer to the Links section for chest pain algorithms and additional  guidance. Performed at [facility], [address] [place], [place] [address]   Pro Brain natriuretic peptide     Status: None   Collection Time: 12/24/23  6:23 PM  Result Value Ref Range   Pro Brain Natriuretic Peptide 211.0 <300.0 pg/mL    Comment: (NOTE) Age Group        Cut-Points    Interpretation  < 50 years     450 pg/mL       NT-proBNP > 450 pg/mL indicates                                ADHF is likely              50 to 75 years  900 pg/mL      NT-proBNP > 900 pg/mL indicates          ADHF is likely  > 75 years  1800 pg/mL     NT-proBNP > 1800 pg/mL indicates          ADHF is likely                           All ages    Results between       Indeterminate. Further clinical             300 and the cut-   information is needed to determine            point for age group   if ADHF is present.                                                             Elecsys proBNP II/ Elecsys proBNP II STAT           Cut-Point                       Interpretation  300 pg/mL                    NT-proBNP <300pg/mL indicates                             ADHF is not likely  Performed at [facility] [address] [place], [place] [address]   Troponin T, High Sensitivity     Status: Abnormal   Collection Time: 12/24/23  9:28 PM  Result Value Ref Range   Troponin T High Sensitivity 20 (H) 0 - 19 ng/L    Comment: (NOTE) Biotin concentrations > 1000 ng/mL falsely decrease TnT results.  Serial cardiac troponin measurements are suggested.  Refer to the Links section for chest pain algorithms and additional  guidance. Performed at [facility], [address] [place], [place] [address]   Hepatic function panel     Status: Abnormal   Collection Time:  12/24/23  9:28 PM  Result Value Ref Range   Total Protein 6.9 6.5 - 8.1 g/dL   Albumin 3.7 3.5 - 5.0 g/dL   AST 19 15 - 41 U/L   ALT 31 0 - 44 U/L   Alkaline Phosphatase 99 38 - 126 U/L   Total Bilirubin 0.5 0.0 - 1.2 mg/dL   Bilirubin, Direct 0.2 0.0 - 0.2 mg/dL   Indirect Bilirubin 0.2 (L) 0.3 - 0.9 mg/dL    Comment: Performed at [facility], [address] [place], [place] [address]  CBG monitoring, ED     Status: Abnormal   Collection Time: 12/24/23 10:55 PM  Result Value Ref Range   Glucose-Capillary 135 (H) 70 - 99 mg/dL    Comment: Glucose reference range applies only to samples taken after fasting for at least 8 hours.  Basic metabolic panel     Status: Abnormal   Collection Time: 12/25/23  5:23 AM  Result Value Ref Range   Sodium 135 135 - 145 mmol/L   Potassium 4.3 3.5 - 5.1 mmol/L   Chloride 99 98 - 111 mmol/L   CO2 24 22 - 32 mmol/L   Glucose, Bld 178 (H) 70 - 99 mg/dL    Comment: Glucose reference range applies only to  samples taken after fasting for at least 8 hours.   BUN 31 (H) 6 - 20 mg/dL   Creatinine, Ser 8.84 (H) 0.44 - 1.00 mg/dL   Calcium 8.8 (L) 8.9 - 10.3 mg/dL   GFR, Estimated 55 (L) >60 mL/min    Comment: (NOTE) Calculated using the CKD-EPI Creatinine Equation (2021)    Anion gap 12 5 - 15    Comment: Performed at [facility], [address] [place], [place] [address]  CBC     Status: Abnormal   Collection Time: 12/25/23  5:23 AM  Result Value Ref Range   WBC 7.8 4.0 - 10.5 K/uL   RBC 3.12 (L) 3.87 - 5.11 MIL/uL   Hemoglobin 10.0 (L) 12.0 - 15.0 g/dL   HCT 68.4 (L) 63.9 - 53.9 %   MCV 101.0 (H) 80.0 - 100.0 fL   MCH 32.1 26.0 - 34.0 pg   MCHC 31.7 30.0 - 36.0 g/dL   RDW 83.2 (H) 88.4 - 84.4 %   Platelets 486 (H) 150 - 400 K/uL   nRBC 0.0 0.0 - 0.2 %    Comment: Performed at [facility], [address] [place], [place] [address]  HIV Antibody (routine testing w rflx)     Status: None   Collection Time:  12/25/23  5:23 AM  Result Value Ref Range   HIV Screen 4th Generation wRfx Non Reactive Non Reactive    Comment: Performed at [facility], [address] [place], [place] [address]  CBG monitoring, ED     Status: Abnormal   Collection Time: 12/25/23  8:04 AM  Result Value Ref Range   Glucose-Capillary 173 (H) 70 - 99 mg/dL    Comment: Glucose reference range applies only to samples taken after fasting for at least 8 hours.  CBG monitoring, ED     Status: Abnormal   Collection Time: 12/25/23 11:40 AM  Result Value Ref Range   Glucose-Capillary 212 (H) 70 - 99 mg/dL    Comment: Glucose reference range applies only to samples taken after fasting for at least 8 hours.  ECHOCARDIOGRAM COMPLETE     Status: None   Collection Time: 12/25/23  4:11 PM  Result Value Ref Range   Weight 4,608 oz   Height 69 in   BP 127/90 mmHg   Ao pk vel 1.11 m/s   AV Area VTI 3.23 cm2   AR max vel 3.32 cm2   AV Mean grad 3.0 mmHg   AV Peak grad 4.9 mmHg   Single Plane A2C EF 73.7 %   S' Lateral 2.10 cm   AV Area mean vel 3.20 cm2   Area-P 1/2 3.17 cm2   MV VTI 3.68 cm2   Est EF 55 - 60%   CBG monitoring, ED     Status: Abnormal   Collection Time: 12/25/23  4:27 PM  Result Value Ref Range   Glucose-Capillary 203 (H) 70 - 99 mg/dL    Comment: Glucose reference range applies only to samples taken after fasting for at least 8 hours.   Comment 1 Notify RN    Comment 2 Document in Chart   Heparin  level (unfractionated)     Status: Abnormal   Collection Time: 12/25/23  4:50 PM  Result Value Ref Range   Heparin  Unfractionated 0.74 (H) 0.30 - 0.70 IU/mL    Comment: (NOTE) The clinical reportable range upper limit is being lowered to >1.10 to align with the FDA approved guidance for the current  laboratory assay.  If heparin  results are below expected values, and patient dosage has  been confirmed, suggest follow up testing of antithrombin III levels. Performed at [facility], [address] [place], [place] [address]   CBG monitoring, ED     Status: Abnormal   Collection Time: 12/25/23  8:47 PM  Result Value Ref Range   Glucose-Capillary 186 (H) 70 - 99 mg/dL    Comment: Glucose reference range applies only to samples taken after fasting for at least 8 hours.   Comment 1 Notify RN   Cytology - Non PAP;     Status: None   Collection Time: 12/26/23 12:00 AM  Result Value Ref Range   CYTOLOGY - NON GYN      CYTOLOGY - NON PAP [facility] [address] [place], [place] [address] Telephone [phone] or [phone] Fax [phone]  CYTOPATHOLOGY REPORT   Accession #: [id number] Patient Name: Nancy [name], Nancy [name] Visit # : [id number]  MRN: [id number] Physician: Caleen Qualia DOB/Age September 07, 1965 (Age: 89) Gender: F Collected Date: 12/26/2023 Received Date: 12/26/2023  FINAL DIAGNOSIS STATEMENT Of SPECIMEN ADEQUACY:  INTERPRETATION(S):      NO MALIGNANT CELLS IDENTIFIED.      DATE SIGNED OUT: 12/30/2023 ELECTRONIC SIGNATURE : Belvie Come, John, Pathologist, Electronic Signature   CASE COMMENTS   CLINICAL HISTORY  SOURCE OF SPECIMEN(S) Pericardial Fluid  SPECIMEN COMMENTS: SPECIMEN CLINICAL INFORMATION:    Gross Description Specimen: Received is/are 50cc of red fluid in a sterile syringe and one slide      Prepared:      # Smears: 1      # Concentration Technique Slides (i.e. ThinPrep): 1      # Cell Block: 1       # Diff-Quick Stain: 0        Report signed out from the following location(s) [place] [address] [place], [place] [address] CLIA #: [id number]  [facility] [address] [place], [place] [address] CLIA #: [id number]   CBC     Status: Abnormal   Collection Time: 12/26/23  2:02 AM  Result Value Ref Range   WBC 10.5 4.0 - 10.5 K/uL   RBC 3.28 (L) 3.87 - 5.11 MIL/uL   Hemoglobin 10.4 (L) 12.0 - 15.0 g/dL   HCT 66.2 (L) 63.9 - 53.9  %   MCV 102.7 (H) 80.0 - 100.0 fL   MCH 31.7 26.0 - 34.0 pg   MCHC 30.9 30.0 - 36.0 g/dL   RDW 83.1 (H) 88.4 - 84.4 %   Platelets 454 (H) 150 - 400 K/uL   nRBC 0.0 0.0 - 0.2 %    Comment: Performed at [facility], [address], [place], [place] [address]  Basic metabolic panel with GFR     Status: Abnormal   Collection Time: 12/26/23  2:02 AM  Result Value Ref Range   Sodium 133 (L) 135 - 145 mmol/L   Potassium 4.5 3.5 - 5.1 mmol/L   Chloride 98 98 - 111 mmol/L   CO2 22 22 - 32 mmol/L   Glucose, Bld 199 (H) 70 - 99 mg/dL    Comment: Glucose reference range applies only to samples taken after fasting for at least 8 hours.   BUN 36 (H) 6 - 20 mg/dL   Creatinine, Ser 8.71 (H) 0.44 - 1.00 mg/dL   Calcium 8.9 8.9 - 89.6 mg/dL   GFR, Estimated 48 (L) >60  mL/min    Comment: (NOTE) Calculated using the CKD-EPI Creatinine Equation (2021)    Anion gap 13 5 - 15    Comment: Performed at [facility], [address] [place], [place] [address]  Magnesium      Status: None   Collection Time: 12/26/23  2:02 AM  Result Value Ref Range   Magnesium  2.2 1.7 - 2.4 mg/dL    Comment: Performed at [facility], [address] [place], [place] [address]  Heparin  level (unfractionated)     Status: None   Collection Time: 12/26/23  2:02 AM  Result Value Ref Range   Heparin  Unfractionated 0.48 0.30 - 0.70 IU/mL    Comment: (NOTE) The clinical reportable range upper limit is being lowered to >1.10 to align with the FDA approved guidance for the current laboratory assay.  If heparin  results are below expected values, and patient dosage has  been confirmed, suggest follow up testing of antithrombin III levels. Performed at [facility], [address] [place], [place] [address]   Heparin  level (unfractionated)     Status: Abnormal   Collection Time: 12/26/23  7:29 AM  Result Value Ref Range   Heparin  Unfractionated 0.21 (L) 0.30 - 0.70 IU/mL    Comment:  (NOTE) The clinical reportable range upper limit is being lowered to >1.10 to align with the FDA approved guidance for the current laboratory assay.  If heparin  results are below expected values, and patient dosage has  been confirmed, suggest follow up testing of antithrombin III levels. Performed at [facility], [address] [place], [place] [address]   CBG monitoring, ED     Status: Abnormal   Collection Time: 12/26/23  7:35 AM  Result Value Ref Range   Glucose-Capillary 189 (H) 70 - 99 mg/dL    Comment: Glucose reference range applies only to samples taken after fasting for at least 8 hours.  Body fluid cell count with differential     Status: Abnormal   Collection Time: 12/26/23  9:47 AM  Result Value Ref Range   Fluid Type-FCT PERICARDIAL     Comment: CORRECTED ON 12/19 AT 1025: PREVIOUSLY REPORTED AS CYTO MISC   Color, Fluid RED (A) YELLOW   Appearance, Fluid TURBID (A) CLEAR   Total Nucleated Cell Count, Fluid 3,260 cu mm   Neutrophil Count, Fluid 79 %   Lymphs, Fluid 16 %   Monocyte-Macrophage-Serous Fluid 5 %   Eos, Fluid 0 %    Comment: Performed at [facility], [address] [place], [place] [address]  Glucose, Body Fluid Other     Status: None   Collection Time: 12/26/23  9:47 AM  Result Value Ref Range   Glucose, Body Fluid Other 165 mg/dL    Comment: (NOTE)             _________________________________________            : BODY FLUID TYPE :        GLUCOSE        :            :_________________:_______________________:            : Amniotic Fluid  :        45 - 76        :            :_________________:_______________________:            : Bile, Clear     :            <  5        :            :_________________:_______________________:            : Bile, Yellow    :            < 8        :            :_________________:_______________________:            : Lymph           :        48 - 200       :             :_________________:_______________________:            : Nasal Secretion :            < 10       :            :_________________:_______________________:            : Pleural Fluid   :        65 -  99       :            :_________________:_______________________:            : Saliva          :            <  2       :            : (Mixed Glands)  :                       :            :_________________:___________ ____________:            : Sweat           :            <  7       :            :_________________:_______________________:            : Synovial Fluid  :        65 -  99       :            :_________________:_______________________:            : Tears           :        76 - 288       :            :_________________:_______________________:             Bronwen ORN, Ehrhardt V. Reference Intervals             for Adults and Children 2008. Ninth             edition (V9.1) [employer],             [place]; [place]: July 2009. Performed At: [facility] [address] [place], [place] [id number] Jennette Shorter MD Ey:[id number]    Source of Sample PERICARDIAL     Comment: Performed at [facility], [address] [place], [place] [address]  Protein, body fluid (other)     Status: None   Collection Time: 12/26/23  9:47 AM  Result Value Ref Range   Total Protein, Body Fluid Other 5.1 g/dL    Comment: (NOTE)             _________________________________________            : BODY FLUID TYPE :     TOTAL PROTEIN     :            :_________________:_______________________:            : Amniotic Fluid  :               <0.4    :            :_________________:_______________________:            :                 : Nonmalignant: <3.0    :            : Ascitic Fluid   : Malignant:    >3.0    :            :_________________:_______________________:            : Bile, Clear     :               <0.9    :            :_________________:_______________________:             : Bile, Yellow    :          0.2 - 0.6    :            :_________________:_______________________:            : Lymph           :          2.2 - 6.0    :            :_________________:_______________________:            : Human Milk      :          1.9 - 2.0    :            :_________________: ______________________:            : Nasal Secretion :          0.1 - 3.5    :            :_________________:___________ ____________:            : Pancreatic      :          0.0 - 0.1    :            : Juice           :    (post stimulation) :            :_________________:_______________________:            :                 : Transudate:   <3.0    :            : Pleural Fluid   : Exudate:      >3.0    :            :_________________:_______________________:            : Saliva          :  0.1 - 0.2    :            : (Mixed Glands)  :                       :            :_________________:_______________________:            : Synovial Fluid  :               <2.5    :            :_________________:_______________________:            : Tears           :          0.8 - 0.9    :            :_________________:_______________________:             Bronwen ORN, Gwynn GAILS. Reference Intervals             for  Adults and Children 2008. Ninth             Edition (V9.1) [employer],             [place]; [place]: July 2009. Performed At: [facility] [address] [place], [place] [id number] Jennette Shorter MD Ey:[id number]    Source of Sample PERICARDIAL     Comment: Performed at [facility], [address] [place], [place] [address]  LD, Body Fluid (other)     Status: None   Collection Time: 12/26/23  9:47 AM  Result Value Ref Range   Source of Sample PERICARDIAL     Comment: Performed at [facility], [address] [place], [place] [address]   LD, Body Fluid 2,860 IU/L    Comment: (NOTE)              _________________________________________            : BODY FLUID TYPE :          LDH          :            :_________________:_______________________:            :                 : Nonmalignant:  < 60%  :            :                 :  of the serum LDH     :            : Ascitic Fluid   : Malignant:     > 60%  :            :                 :  of the serum LDH     :            :_________________:_______________________:            : Gastric Juice   :                < 35   :            :_________________:_______________________:            :                 :  Transudate:    <200   :            : Pleural Fluid   : Exudate:       >200   :            :_________________:_______________________:            : Saliva          :           113 - 609   :            : (Mixed Glands)  :                       :            :_________________:_______________________:            : Synovial Fluid  :                <240   :            :_________________:___________ ____________:             Bronwen ORN, Gwynn GAILS. Reference Intervals             for Adults and Children 2008. Ninth             Edition (V9.1) [employer],             [place]; [place]: July 2009. Results confirmed on dilution. Performed At: [facility] [address] [place], [place] [id number] Jennette Shorter MD Ey:[id number]   Body fluid culture w Gram Stain     Status: None   Collection Time: 12/26/23  9:47 AM   Specimen: PATH Cytology Misc. fluid; Body Fluid  Result Value Ref Range   Specimen Description      PERICARDIAL Performed at [facility], [address] [place], [place] [address]    Special Requests      PERICARDIAL Performed at [facility], [address] [place], [place] [address]    Gram Stain      FEW WBC PRESENT, PREDOMINANTLY MONONUCLEAR NO ORGANISMS SEEN    Culture      NO GROWTH 3 DAYS Performed at [facility], [address], [place], [place]  [address]    Report Status 12/29/2023 FINAL   ECHOCARDIOGRAM LIMITED     Status: None   Collection Time: 12/26/23 10:23 AM  Result Value Ref Range   Weight 4,608 oz   Height 69 in   BP 113/78 mmHg   Est EF 55 - 60%   Glucose, capillary     Status: Abnormal   Collection Time: 12/26/23 10:39 AM  Result Value Ref Range   Glucose-Capillary 173 (H) 70 - 99 mg/dL    Comment: Glucose reference range applies only to samples taken after fasting for at least 8 hours.  MRSA Next Gen by PCR, Nasal     Status: None   Collection Time: 12/26/23 10:50 AM   Specimen: Nasal Mucosa; Nasal Swab  Result Value Ref Range   MRSA by PCR Next Gen NOT DETECTED NOT DETECTED    Comment: (NOTE) The GeneXpert MRSA Assay (FDA approved for NASAL specimens only), is one component of a comprehensive MRSA colonization surveillance program. It is not intended to diagnose MRSA infection nor to guide or monitor treatment for MRSA infections. Test performance is not FDA approved in patients less than 9 years old. Performed  at [facility], [address] [place], [place] [address]   Glucose, capillary     Status: Abnormal   Collection Time: 12/26/23  3:55 PM  Result Value Ref Range   Glucose-Capillary 143 (H) 70 - 99 mg/dL    Comment: Glucose reference range applies only to samples taken after fasting for at least 8 hours.  Glucose, capillary     Status: Abnormal   Collection Time: 12/26/23  9:39 PM  Result Value Ref Range   Glucose-Capillary 197 (H) 70 - 99 mg/dL    Comment: Glucose reference range applies only to samples taken after fasting for at least 8 hours.  CBC     Status: Abnormal   Collection Time: 12/27/23  3:46 AM  Result Value Ref Range   WBC 10.0 4.0 - 10.5 K/uL   RBC 3.17 (L) 3.87 - 5.11 MIL/uL   Hemoglobin 10.0 (L) 12.0 - 15.0 g/dL   HCT 68.0 (L) 63.9 - 53.9 %   MCV 100.6 (H) 80.0 - 100.0 fL   MCH 31.5 26.0 - 34.0 pg   MCHC 31.3 30.0 - 36.0 g/dL   RDW 83.8 (H) 88.4 - 84.4 %    Platelets 378 150 - 400 K/uL   nRBC 0.0 0.0 - 0.2 %    Comment: Performed at [facility], [address] [place], [place] [address]  Basic metabolic panel with GFR     Status: Abnormal   Collection Time: 12/27/23  3:46 AM  Result Value Ref Range   Sodium 137 135 - 145 mmol/L   Potassium 3.8 3.5 - 5.1 mmol/L   Chloride 102 98 - 111 mmol/L   CO2 28 22 - 32 mmol/L   Glucose, Bld 188 (H) 70 - 99 mg/dL    Comment: Glucose reference range applies only to samples taken after fasting for at least 8 hours.   BUN 27 (H) 6 - 20 mg/dL   Creatinine, Ser 8.97 (H) 0.44 - 1.00 mg/dL   Calcium 8.3 (L) 8.9 - 10.3 mg/dL   GFR, Estimated >39 >39 mL/min    Comment: (NOTE) Calculated using the CKD-EPI Creatinine Equation (2021)    Anion gap 7 5 - 15    Comment: Performed at [facility], [address] [place], [place] [address]  Glucose, capillary     Status: Abnormal   Collection Time: 12/27/23  7:32 AM  Result Value Ref Range   Glucose-Capillary 213 (H) 70 - 99 mg/dL    Comment: Glucose reference range applies only to samples taken after fasting for at least 8 hours.  Glucose, capillary     Status: Abnormal   Collection Time: 12/27/23 11:07 AM  Result Value Ref Range   Glucose-Capillary 165 (H) 70 - 99 mg/dL    Comment: Glucose reference range applies only to samples taken after fasting for at least 8 hours.  Glucose, capillary     Status: Abnormal   Collection Time: 12/27/23  3:42 PM  Result Value Ref Range   Glucose-Capillary 140 (H) 70 - 99 mg/dL    Comment: Glucose reference range applies only to samples taken after fasting for at least 8 hours.  Glucose, capillary     Status: Abnormal   Collection Time: 12/27/23 10:02 PM  Result Value Ref Range   Glucose-Capillary 194 (H) 70 - 99 mg/dL    Comment: Glucose reference range applies only to samples taken after fasting for at least 8 hours.  CBC     Status: Abnormal  Collection Time: 12/28/23  4:29 AM  Result Value Ref  Range   WBC 7.0 4.0 - 10.5 K/uL   RBC 2.88 (L) 3.87 - 5.11 MIL/uL   Hemoglobin 8.9 (L) 12.0 - 15.0 g/dL   HCT 70.9 (L) 63.9 - 53.9 %   MCV 100.7 (H) 80.0 - 100.0 fL   MCH 30.9 26.0 - 34.0 pg   MCHC 30.7 30.0 - 36.0 g/dL   RDW 84.0 (H) 88.4 - 84.4 %   Platelets 311 150 - 400 K/uL   nRBC 0.0 0.0 - 0.2 %    Comment: Performed at [facility], [address] [place], [place] [address]  Basic metabolic panel with GFR     Status: Abnormal   Collection Time: 12/28/23  4:29 AM  Result Value Ref Range   Sodium 139 135 - 145 mmol/L   Potassium 3.7 3.5 - 5.1 mmol/L   Chloride 103 98 - 111 mmol/L   CO2 30 22 - 32 mmol/L   Glucose, Bld 188 (H) 70 - 99 mg/dL    Comment: Glucose reference range applies only to samples taken after fasting for at least 8 hours.   BUN 18 6 - 20 mg/dL   Creatinine, Ser 9.06 0.44 - 1.00 mg/dL   Calcium 8.2 (L) 8.9 - 10.3 mg/dL   GFR, Estimated >39 >39 mL/min    Comment: (NOTE) Calculated using the CKD-EPI Creatinine Equation (2021)    Anion gap 6 5 - 15    Comment: Performed at [facility], [address] [place], [place] [address]  Glucose, capillary     Status: Abnormal   Collection Time: 12/28/23  7:45 AM  Result Value Ref Range   Glucose-Capillary 183 (H) 70 - 99 mg/dL    Comment: Glucose reference range applies only to samples taken after fasting for at least 8 hours.  ECHOCARDIOGRAM LIMITED     Status: None   Collection Time: 12/28/23  8:16 AM  Result Value Ref Range   Weight 4,608 oz   Height 69 in   BP 151/65 mmHg   S' Lateral 3.10 cm   Est EF 55 - 60%   Glucose, capillary     Status: Abnormal   Collection Time: 12/28/23 11:30 AM  Result Value Ref Range   Glucose-Capillary 133 (H) 70 - 99 mg/dL    Comment: Glucose reference range applies only to samples taken after fasting for at least 8 hours.  CBC     Status: Abnormal   Collection Time: 12/28/23  3:47 PM  Result Value Ref Range   WBC 7.5 4.0 - 10.5 K/uL   RBC 3.07 (L)  3.87 - 5.11 MIL/uL   Hemoglobin 9.6 (L) 12.0 - 15.0 g/dL   HCT 69.3 (L) 63.9 - 53.9 %   MCV 99.7 80.0 - 100.0 fL   MCH 31.3 26.0 - 34.0 pg   MCHC 31.4 30.0 - 36.0 g/dL   RDW 84.0 (H) 88.4 - 84.4 %   Platelets 321 150 - 400 K/uL   nRBC 0.0 0.0 - 0.2 %    Comment: Performed at [facility], [address] [place], [place] [address]  Glucose, capillary     Status: Abnormal   Collection Time: 12/28/23  4:34 PM  Result Value Ref Range   Glucose-Capillary 196 (H) 70 - 99 mg/dL    Comment: Glucose reference range applies only to samples taken after fasting for at least 8 hours.  Glucose, capillary     Status: Abnormal  Collection Time: 12/28/23  7:40 PM  Result Value Ref Range   Glucose-Capillary 208 (H) 70 - 99 mg/dL    Comment: Glucose reference range applies only to samples taken after fasting for at least 8 hours.  Glucose, capillary     Status: Abnormal   Collection Time: 12/28/23  9:15 PM  Result Value Ref Range   Glucose-Capillary 206 (H) 70 - 99 mg/dL    Comment: Glucose reference range applies only to samples taken after fasting for at least 8 hours.  CBC     Status: Abnormal   Collection Time: 12/29/23  4:03 AM  Result Value Ref Range   WBC 6.5 4.0 - 10.5 K/uL   RBC 2.93 (L) 3.87 - 5.11 MIL/uL   Hemoglobin 9.1 (L) 12.0 - 15.0 g/dL   HCT 70.4 (L) 63.9 - 53.9 %   MCV 100.7 (H) 80.0 - 100.0 fL   MCH 31.1 26.0 - 34.0 pg   MCHC 30.8 30.0 - 36.0 g/dL   RDW 84.0 (H) 88.4 - 84.4 %   Platelets 304 150 - 400 K/uL   nRBC 0.0 0.0 - 0.2 %    Comment: Performed at [facility], [address], [place], [place] [address]  Basic metabolic panel with GFR     Status: Abnormal   Collection Time: 12/29/23  4:03 AM  Result Value Ref Range   Sodium 138 135 - 145 mmol/L   Potassium 3.8 3.5 - 5.1 mmol/L   Chloride 100 98 - 111 mmol/L   CO2 31 22 - 32 mmol/L   Glucose, Bld 202 (H) 70 - 99 mg/dL    Comment: Glucose reference range applies only to samples taken after  fasting for at least 8 hours.   BUN 15 6 - 20 mg/dL   Creatinine, Ser 9.09 0.44 - 1.00 mg/dL   Calcium 8.3 (L) 8.9 - 10.3 mg/dL   GFR, Estimated >39 >39 mL/min    Comment: (NOTE) Calculated using the CKD-EPI Creatinine Equation (2021)    Anion gap 7 5 - 15    Comment: Performed at [facility], [address] [place], [place] [address]  Vitamin B12     Status: Abnormal   Collection Time: 12/29/23  4:03 AM  Result Value Ref Range   Vitamin B-12 1,313 (H) 180 - 914 pg/mL    Comment: Performed at [facility], [address], [place], [place] [address]  Folate     Status: None   Collection Time: 12/29/23  4:03 AM  Result Value Ref Range   Folate 16.3 >5.9 ng/mL    Comment: Performed at [facility], [address] [place], [place] [address]  Glucose, capillary     Status: Abnormal   Collection Time: 12/29/23  7:33 AM  Result Value Ref Range   Glucose-Capillary 207 (H) 70 - 99 mg/dL    Comment: Glucose reference range applies only to samples taken after fasting for at least 8 hours.  Glucose, capillary     Status: Abnormal   Collection Time: 12/29/23 11:48 AM  Result Value Ref Range   Glucose-Capillary 206 (H) 70 - 99 mg/dL    Comment: Glucose reference range applies only to samples taken after fasting for at least 8 hours.  Glucose, capillary     Status: Abnormal   Collection Time: 12/29/23  4:33 PM  Result Value Ref Range   Glucose-Capillary 185 (H) 70 - 99 mg/dL    Comment: Glucose reference range applies only to  samples taken after fasting for at least 8 hours.  Glucose, capillary     Status: Abnormal   Collection Time: 12/29/23  9:18 PM  Result Value Ref Range   Glucose-Capillary 176 (H) 70 - 99 mg/dL    Comment: Glucose reference range applies only to samples taken after fasting for at least 8 hours.  CBC     Status: Abnormal   Collection Time: 12/30/23  4:00 AM  Result Value Ref Range   WBC 6.4 4.0 - 10.5 K/uL   RBC 3.01 (L) 3.87 - 5.11  MIL/uL   Hemoglobin 9.3 (L) 12.0 - 15.0 g/dL   HCT 69.6 (L) 63.9 - 53.9 %   MCV 100.7 (H) 80.0 - 100.0 fL   MCH 30.9 26.0 - 34.0 pg   MCHC 30.7 30.0 - 36.0 g/dL   RDW 84.3 (H) 88.4 - 84.4 %   Platelets 280 150 - 400 K/uL   nRBC 0.0 0.0 - 0.2 %    Comment: Performed at [facility], [address] [place], [place] [address]  Basic metabolic panel with GFR     Status: Abnormal   Collection Time: 12/30/23  4:00 AM  Result Value Ref Range   Sodium 141 135 - 145 mmol/L   Potassium 4.0 3.5 - 5.1 mmol/L   Chloride 102 98 - 111 mmol/L   CO2 32 22 - 32 mmol/L   Glucose, Bld 123 (H) 70 - 99 mg/dL    Comment: Glucose reference range applies only to samples taken after fasting for at least 8 hours.   BUN 14 6 - 20 mg/dL   Creatinine, Ser 9.14 0.44 - 1.00 mg/dL   Calcium 8.5 (L) 8.9 - 10.3 mg/dL   GFR, Estimated >39 >39 mL/min    Comment: (NOTE) Calculated using the CKD-EPI Creatinine Equation (2021)    Anion gap 7 5 - 15    Comment: Performed at [facility], [address] [place], [place] [address]  Glucose, capillary     Status: Abnormal   Collection Time: 12/30/23  7:29 AM  Result Value Ref Range   Glucose-Capillary 115 (H) 70 - 99 mg/dL    Comment: Glucose reference range applies only to samples taken after fasting for at least 8 hours.  Glucose, capillary     Status: Abnormal   Collection Time: 12/30/23 11:43 AM  Result Value Ref Range   Glucose-Capillary 158 (H) 70 - 99 mg/dL    Comment: Glucose reference range applies only to samples taken after fasting for at least 8 hours.  Lipid Profile     Status: Abnormal   Collection Time: 01/07/24 11:11 AM  Result Value Ref Range   Cholesterol, Total 154 100 - 199 mg/dL   Triglycerides 832 (H) 0 - 149 mg/dL   HDL 34 (L) >60 mg/dL   VLDL Cholesterol Cal 29 5 - 40 mg/dL   LDL Chol Calc (NIH) 91 0 - 99 mg/dL   Chol/HDL Ratio 4.5 (H) 0.0 - 4.4 ratio    Comment:                                   T. Chol/HDL Ratio  Men  Women                               1/2 Avg.Risk  3.4    3.3                                   Avg.Risk  5.0    4.4                                2X Avg.Risk  9.6    7.1                                3X Avg.Risk 23.4   11.0   Vitamin D  (25 hydroxy)     Status: Abnormal   Collection Time: 01/07/24 11:11 AM  Result Value Ref Range   Vit D, 25-Hydroxy 29.0 (L) 30.0 - 100.0 ng/mL    Comment: Vitamin D  deficiency has been defined by the Institute of Medicine and an Endocrine Society practice guideline as a level of serum 25-OH vitamin D  less than 20 ng/mL (1,2). The Endocrine Society went on to further define vitamin D  insufficiency as a level between 21 and 29 ng/mL (2). 1. IOM (Institute of Medicine). 2010. Dietary reference    intakes for calcium and D. [place]: The    [employer]. 2. Holick MF, Binkley [place], Bischoff-Ferrari HA, et al.    Evaluation, treatment, and prevention of vitamin D     deficiency: an Endocrine Society clinical practice    guideline. JCEM. 2011 Jul; 96(7):1911-30.   Vitamin B12     Status: None   Collection Time: 01/07/24 11:11 AM  Result Value Ref Range   Vitamin B-12 670 232 - 1,245 pg/mL  Hemoglobin A1c     Status: Abnormal   Collection Time: 01/07/24 11:11 AM  Result Value Ref Range   Hgb A1c MFr Bld 8.0 (H) 4.8 - 5.6 %    Comment:          Prediabetes: 5.7 - 6.4          Diabetes: >6.4          Glycemic control for adults with diabetes: <7.0    Est. average glucose Bld gHb Est-mCnc 183 mg/dL  CBC with Diff     Status: Abnormal   Collection Time: 01/07/24 11:11 AM  Result Value Ref Range   WBC 9.3 3.4 - 10.8 x10E3/uL   RBC 3.67 (L) 3.77 - 5.28 x10E6/uL   Hemoglobin 11.4 11.1 - 15.9 g/dL   Hematocrit 64.0 65.9 - 46.6 %   MCV 98 (H) 79 - 97 fL   MCH 31.1 26.6 - 33.0 pg   MCHC 31.8 31.5 - 35.7 g/dL   RDW 84.7 88.2 - 84.5 %   Platelets 400 150 - 450 x10E3/uL   Neutrophils 60 Not  Estab. %   Lymphs 26 Not Estab. %   Monocytes 8 Not Estab. %   Eos 5 Not Estab. %   Basos 1 Not Estab. %   Neutrophils Absolute 5.5 1.4 - 7.0 x10E3/uL   Lymphocytes Absolute 2.5 0.7 - 3.1 x10E3/uL   Monocytes Absolute 0.8 0.1 - 0.9 x10E3/uL   EOS (ABSOLUTE) 0.4 0.0 - 0.4 x10E3/uL   Basophils Absolute 0.1  0.0 - 0.2 x10E3/uL   Immature Granulocytes 0 Not Estab. %   Immature Grans (Abs) 0.0 0.0 - 0.1 x10E3/uL  CMP14+EGFR     Status: Abnormal   Collection Time: 01/07/24 11:11 AM  Result Value Ref Range   Glucose 169 (H) 70 - 99 mg/dL   BUN 24 6 - 24 mg/dL   Creatinine, Ser 9.02 0.57 - 1.00 mg/dL   eGFR 67 >40 fO/fpw/8.26   BUN/Creatinine Ratio 25 (H) 9 - 23   Sodium 140 134 - 144 mmol/L   Potassium 4.7 3.5 - 5.2 mmol/L   Chloride 102 96 - 106 mmol/L   CO2 20 20 - 29 mmol/L   Calcium 9.3 8.7 - 10.2 mg/dL   Total Protein 6.9 6.0 - 8.5 g/dL   Albumin 3.9 3.8 - 4.9 g/dL   Globulin, Total 3.0 1.5 - 4.5 g/dL   Bilirubin Total 0.2 0.0 - 1.2 mg/dL   Alkaline Phosphatase 80 49 - 135 IU/L   AST 18 0 - 40 IU/L   ALT 17 0 - 32 IU/L       Assessment & Plan Generalized anxiety disorder Patient stable.  Well controlled with current therapy.   Continue current meds.   Needs flu shot Flu vaccine given in office today.  Suggested pt manage side effects with supportive therapies.   Type 2 diabetes mellitus with hyperglycemia, with long-term current use of insulin  (HCC) Type 2 diabetes mellitus with diabetic nephropathy, with long-term current use of insulin  (HCC) Checking labs today. Will call pt. With results  Continue current diabetes POC, as patient has been well controlled on current regimen.  Will adjust meds if needed based on labs.   -CBC w/Diff -CMP w/eGFR -Hemoglobin A1C  Essential hypertension Blood pressure well controlled with current medications.  Continue current therapy.  Will reassess at follow up.   - CBC w/Diff - CMP w/eGFR  Obesity, Class III, BMI 40-49.9  (morbid obesity) (HCC) Continue current meds.  Will adjust as needed based on results.  The patient is asked to make an attempt to improve diet and exercise patterns to aid in medical management of this problem. Addressed importance of increasing and maintaining water  intake.   Mixed hyperlipidemia Checking labs today.  Continue current therapy for lipid control. Will modify as needed based on labwork results.   -CMP w/eGFR -Lipid Panel  Vitamin D  deficiency, unspecified B12 deficiency due to diet Checking labs today.  Will continue supplements as needed.   - Vitamin D  - Vitamin B12 - TSH     Return in about 3 months (around 02/06/2024).   Total time spent: 20 minutes  ALAN CHRISTELLA ARRANT, FNP  11/06/2023   This document may have been prepared by [facility] Voice Recognition software and as such may include unintentional dictation errors.      [1]  Allergies Allergen Reactions   Shellfish Allergy Anaphylaxis   Codeine Other (See Comments)    Migraine  Other Reaction(s): Other (See Comments)  Reaction:  Severe migraines   Morphine  And Codeine Other (See Comments)    Reaction:  Severe migraines    Rosuvastatin     Other reaction(s): Muscle Pain   Metformin  And Related Diarrhea   Percocet [Oxycodone -Acetaminophen ] Nausea And Vomiting   Sulfa Antibiotics Rash   Theophyllines Itching and Rash   "

## 2024-01-14 NOTE — Assessment & Plan Note (Signed)
 Continue current meds.  Will adjust as needed based on results.  The patient is asked to make an attempt to improve diet and exercise patterns to aid in medical management of this problem. Addressed importance of increasing and maintaining water  intake.

## 2024-01-15 ENCOUNTER — Inpatient Hospital Stay: Attending: Oncology

## 2024-01-15 DIAGNOSIS — C563 Malignant neoplasm of bilateral ovaries: Secondary | ICD-10-CM | POA: Insufficient documentation

## 2024-01-15 DIAGNOSIS — C786 Secondary malignant neoplasm of retroperitoneum and peritoneum: Secondary | ICD-10-CM | POA: Insufficient documentation

## 2024-01-15 DIAGNOSIS — Z1502 Genetic susceptibility to malignant neoplasm of ovary: Secondary | ICD-10-CM | POA: Diagnosis not present

## 2024-01-15 DIAGNOSIS — Z1509 Genetic susceptibility to other malignant neoplasm: Secondary | ICD-10-CM | POA: Diagnosis not present

## 2024-01-15 DIAGNOSIS — R112 Nausea with vomiting, unspecified: Secondary | ICD-10-CM

## 2024-01-15 DIAGNOSIS — C569 Malignant neoplasm of unspecified ovary: Secondary | ICD-10-CM

## 2024-01-15 DIAGNOSIS — R971 Elevated cancer antigen 125 [CA 125]: Secondary | ICD-10-CM | POA: Insufficient documentation

## 2024-01-15 DIAGNOSIS — E278 Other specified disorders of adrenal gland: Secondary | ICD-10-CM | POA: Diagnosis not present

## 2024-01-15 DIAGNOSIS — R1010 Upper abdominal pain, unspecified: Secondary | ICD-10-CM

## 2024-01-15 LAB — CBC WITH DIFFERENTIAL (CANCER CENTER ONLY)
Abs Immature Granulocytes: 0.03 K/uL (ref 0.00–0.07)
Basophils Absolute: 0.1 K/uL (ref 0.0–0.1)
Basophils Relative: 2 %
Eosinophils Absolute: 0.5 K/uL (ref 0.0–0.5)
Eosinophils Relative: 6 %
HCT: 38.9 % (ref 36.0–46.0)
Hemoglobin: 12.3 g/dL (ref 12.0–15.0)
Immature Granulocytes: 0 %
Lymphocytes Relative: 44 %
Lymphs Abs: 3.3 K/uL (ref 0.7–4.0)
MCH: 30.4 pg (ref 26.0–34.0)
MCHC: 31.6 g/dL (ref 30.0–36.0)
MCV: 96 fL (ref 80.0–100.0)
Monocytes Absolute: 0.6 K/uL (ref 0.1–1.0)
Monocytes Relative: 9 %
Neutro Abs: 2.9 K/uL (ref 1.7–7.7)
Neutrophils Relative %: 39 %
Platelet Count: 330 K/uL (ref 150–400)
RBC: 4.05 MIL/uL (ref 3.87–5.11)
RDW: 15.5 % (ref 11.5–15.5)
WBC Count: 7.4 K/uL (ref 4.0–10.5)
nRBC: 0 % (ref 0.0–0.2)

## 2024-01-15 LAB — CMP (CANCER CENTER ONLY)
ALT: 28 U/L (ref 0–44)
AST: 30 U/L (ref 15–41)
Albumin: 4.2 g/dL (ref 3.5–5.0)
Alkaline Phosphatase: 73 U/L (ref 38–126)
Anion gap: 13 (ref 5–15)
BUN: 19 mg/dL (ref 6–20)
CO2: 27 mmol/L (ref 22–32)
Calcium: 9.6 mg/dL (ref 8.9–10.3)
Chloride: 101 mmol/L (ref 98–111)
Creatinine: 1.05 mg/dL — ABNORMAL HIGH (ref 0.44–1.00)
GFR, Estimated: >60 mL/min
Glucose, Bld: 96 mg/dL (ref 70–99)
Potassium: 4.2 mmol/L (ref 3.5–5.1)
Sodium: 141 mmol/L (ref 135–145)
Total Bilirubin: 0.3 mg/dL (ref 0.0–1.2)
Total Protein: 7.7 g/dL (ref 6.5–8.1)

## 2024-01-16 ENCOUNTER — Inpatient Hospital Stay

## 2024-01-16 LAB — CA 125: Cancer Antigen (CA) 125: 71 U/mL — ABNORMAL HIGH (ref 0.0–38.1)

## 2024-01-20 ENCOUNTER — Encounter: Payer: Self-pay | Admitting: Oncology

## 2024-01-20 ENCOUNTER — Inpatient Hospital Stay: Admitting: Oncology

## 2024-01-20 ENCOUNTER — Inpatient Hospital Stay

## 2024-01-20 VITALS — BP 131/71 | HR 77 | Temp 97.8°F | Resp 18 | Wt 277.1 lb

## 2024-01-20 DIAGNOSIS — R7989 Other specified abnormal findings of blood chemistry: Secondary | ICD-10-CM | POA: Diagnosis not present

## 2024-01-20 DIAGNOSIS — I3139 Other pericardial effusion (noninflammatory): Secondary | ICD-10-CM

## 2024-01-20 DIAGNOSIS — C569 Malignant neoplasm of unspecified ovary: Secondary | ICD-10-CM

## 2024-01-20 DIAGNOSIS — C563 Malignant neoplasm of bilateral ovaries: Secondary | ICD-10-CM | POA: Diagnosis not present

## 2024-01-20 NOTE — Progress Notes (Signed)
 " Hematology/Oncology Progress note [phone] [phone]      CHIEF COMPLAINTS  High grade serous carcinoma of ovaries.   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 Post treatment CT showed excellent response. S/p Debulking R1 on 02/20/2023, s/p 4 cycles of adjuvant carboplatin  taxol   She did not tolerate Olaparib  300mg  BID due to severe diarrhea.   Previously tolerated 250mg  twice daily. Hold off for now to allow full recovery from acute symptoms.  It is unclear if Olaparib  causes pericarditis, it seems to be less likely.  CA 125 is rising, recent CT in mid Dec did not show radiographic progression. Possible biochemical recurrence. Repeat CT at end of Jan 2026 Shared decision was made to wait until her pericarditis resolved and resume Olaparib .  Aspirin  81mg  daily for thrombosis prophylaxis. .    Elevated LFTs LFT has normalized.  Likely reactive to acute viral infection   Pericardial effusion, without tamponade History of Takotsubo cardiomyopathy this is likely due to acute viral infection.  Less likely due to Olaparib  12/16/2023 Pericardiocentesis performed,  900 cc bloody fluid drained, cytology was negative for malignancy cells.  On Colchicine  Follow up with cardiology, there is plan to repeat echo in Feb   Follow up 3 weeks   All questions were answered. The patient knows to call the clinic with any problems, questions or concerns.  Zelphia Cap, MD, PhD [facility] Hematology Oncology 01/20/2024    HISTORY OF PRESENTING ILLNESS:  Nancy [name] 59 y.o. female presents to establish care for  peritoneal carcinomatosis I have reviewed her chart and materials related to her cancer extensively and collaborated history with the patient. 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 Aortic atherosclerosis    11/06/2022 Initial Diagnosis   Primary peritoneal carcinomatosis   -presented to emergency room for evaluation of abdominal pain/bloating, poor oral intake, nonbloody nonbilious vomiting  Patient underwent paracentesis, cytology showed malignant cells. IHC can not be added.    11/14/2022 Imaging   CT chest w contrast  1. Prominent subcentimeter right retrocrural lymph nodes, metastatic disease can not be excluded. 2. Nonspecific ground-glass nodule of the superior portion of the right lower lobe measuring 7 mm. Recommend attention on follow-up. 3. Small left pleural effusion. 4. Partially visualized large volume abdominal ascites and peritoneal thickening, volume of ascites appears increased when compared with the prior CT. 5. Coronary artery calcifications and aortic Atherosclerosis (ICD10-I70.0).   11/18/2022 Procedure   Medi port placement by IR   11/18/2022 Procedure   Omentum biopsy showed  1. Omentum, biopsy,  :   - INVOLVEMENT BY HIGH-GRADE SEROUS CARCINOMA OF GYNECOLOGIC ORIGIN.   Diagnosis Note : The carcinoma is positive for cytokeratin 7, PAX8, WT1, and ER.  Cytokeratin 20 and GATA3 are negative.  P53 is overexpressed.  The morphologic findings in conjunction with the pattern of immunohistochemistry support the  above diagnosis.   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 The report date is 11/25/2022.  The CancerNext-Expanded +  RNAinsight gene panel offered by [employer] and includes sequencing and rearrangement analysis for the following 71 genes: AIP, ALK, APC*, ATM*, AXIN2, BAP1, BARD1, BMPR1A, BRCA1*, BRCA2*, BRIP1*, CDC73, CDH1*,CDK4, CDKN1B, CDKN2A, CHEK2*, CTNNA1, DICER1, FH, FLCN, KIF1B, LZTR1, MAX, MEN1, MET, MLH1*, MSH2*, MSH3, MSH6*, MUTYH*, NF1*, NF2, NTHL1, PALB2*, PHOX2B, PMS2*, POT1, PRKAR1A, PTCH1, PTEN*, RAD51C*, RAD51D*,RB1, RET, SDHA, SDHAF2, SDHB, SDHC, SDHD, SMAD4, SMARCA4, SMARCB1, SMARCE1, STK11, SUFU, TMEM127, TP53*,TSC1, TSC2, VHL; EGFR, EGLN1, HOXB13, KIT, MITF, PDGFRA, POLD1 and POLE (sequencing only); EPCAM and GREM1 (deletion/duplication only).    01/13/2023 Imaging   CT chest abdomen pelvis w contrast showed  Decreasing ascites, trace residual. The areas of omental caking and nodularity along the perineum is decreasing as well with significant residual particularly in the upper anterior abdomen.   Stable small mesenteric and retroperitoneal nodes. Resolved left pleural effusion. No developing new mass lesion. Small left renal angiomyolipoma and upper pole nonobstructing renal stone.   02/20/2023 Surgery    Interval debulking R1 - robotic assisted laparoscopic total hysterectomy, bilateral salpingo-oophorectomy, lysis of adhesions, peritoneal stripping, and omentectomy with stage IIIC high grade serous carcinoma of bilateral ovaries   A. Uterus, bilateral fallopian tubes, and bilateral ovaries, total hysterectomy and bilateral salpingo-oophorectomy: Residual high grade serous carcinoma (1.6 cm) involving bilateral ovaries, left fallopian tube, and uterine serosa.   Additional findings:  Uterus with leiomyomata (up to 1.1 cm) Right fallopian tube with no pathologic diagnosis See synoptic report.   B. Peritoneal nodules, excision: High grade serous carcinoma (up to 0.1 cm).  C. Omentum, omentectomy:  High grade serous carcinoma (0.7 cm). Moderate treatment response (CRS2).      03/04/2023 Cancer Staging   Staging form: Ovary, AJCC 7th Edition - Pathologic stage from 03/04/2023: FIGO Stage IIIC (cM0) - Signed by Babara Call, MD on 03/13/2023 Stage prefix: Initial diagnosis Laterality: Bilateral   12/06/2023 - 12/09/2023 Hospital Admission   hospitalized at [facility] for an acute episode of chest pain radiating under the rib cage, near-syncope, vomiting, and profound weakness, which began while shopping and progressed to syncope and inability to ambulate.  She was found to have moderate size pericardial effusion without evidence of tamponade. She also had hypotension.  mildly elevated inflammatory markers -ESR 41, CRP 12  acute liver injury with an initial AST of 177 and ALT 147. Her AST rose to 585 and ALT rose to 748. Her T. bili and alkaline phosphatase were normal   impaired renal function,  Negative EBV PCR, negative CMV PCR, negative Hep B/C  Negative ANA, negative antimitochondrial Ab  Since discharge, she continues to experience significant fatigue, dyspnea on minimal exertion, and generalized weakness, resulting in inability to perform usual activities and requiring a wheelchair for mobility.  She also endorses  headaches, anorexia, and ongoing nausea without weight loss. She attempts to eat small amounts every few hours but is unable to tolerate full meals. She denies current diarrhea but has a history of intermittent diarrhea while on olaparib , previously managed with preemptive loperamide. She denies current symptoms of gastroesophageal reflux.    12/24/2023 Imaging   CT chest abdomen pelvis w contrast showed 1. New large pericardial effusion. 2. New small right pleural effusion. 3. Prominent appendix with trace surrounding inflammatory stranding, without fluid collection or perforation. Please correlate clinically for early acute appendicitis. 4. Low attenuation 3 cm left adrenal lesion, favored adenoma or myelolipoma.  5. No evidence of metastatic disease.    History of sepsis secondary to diabetic foot infection/osteomyelitis status post left BKA done on 09/24/2022,   Discussed the use of AI scribe software for clinical note transcription with the patient, who gave verbal consent to proceed.    Today she is accompanied by significant other.  Patient previously tolerated Olarparib at 250mg  BID dosage with manageable diarrhea with prophylactic imodium.   Olaparib  maintenance therapy was held following her cardiac complications, and she expresses concern about being off the medication for over a month and the impact on her cancer markers. She is also taking colchicine  and has not yet started magnesium  supplementation due to pharmacy delay, despite documented hypomagnesemia.  She experienced a transient low-grade fever and headache on Saturday evening (temperature up to 100F), which resolved by Sunday morning. She denies current fever, sore throat, or acute infectious symptoms. She continues to feel fatigued but otherwise stable. Transient elevations in liver enzymes during hospitalization have normalized.    MEDICAL HISTORY:  Past Medical History:  Diagnosis Date   Abnormal cardiovascular stress test 09/21/2018   Formatting of this note might be different from the original. Lexiscan Myoview 09/16/2018 revealed mild anterior ischemia   Adverse effect of motion 05/10/2014   AKI (acute kidney injury) 10/22/2022   Anxiety    Arthritis    r knee   Asthma    Breast cyst 05/10/2014   CAD (coronary artery disease)    a.) LHC 06/04/2009 at [facility]; non-obstructive CAD. b.) CTA with FFR 10/08/2018: extensive mixed plaque proximal to mid LAD (51-69%); Coronary Ca score 217; FFR 0.71 dPDA, 0.86 mLAD, 0.87 dLCx.   CCF (congestive cardiac failure) (HCC) 06/06/2008   a.) 30% EF. b.) TTE 06/03/2011: EF >55%; triv MR. c.) TTE 04/27/2018: EF 55%; mild LVH; triv PR, mild MR/TR; G1DD.   Cellulitis of left lower extremity 09/18/2022   Chest pain with high risk for  cardiac etiology 07/02/2016   Chronic use of opiate drug for therapeutic purpose 02/13/2022   Formatting of this note might be different from the original. [facility] Pain Contract signed on 04/18/16 & updated 03/04/17; UDS done on 04/18/16.   Complication of anesthesia    Diabetic foot infection (HCC) 03/13/2015   Diabetic foot ulcer (HCC) 02/08/2021   Diabetic ulcer of left heel associated with type 2 diabetes mellitus (HCC) 02/08/2021   Eczema    Family history of adverse reaction to anesthesia    a.) PONV in mother and grandmother   Gas gangrene (HCC) 09/18/2022   GERD (gastroesophageal reflux disease) 05/10/2014   History of kidney stones    History of MSSA bacteremia due to osteomyelitis L foot 09/18/22 10/22/2022   HLD (hyperlipidemia)    Hyponatremia 03/13/2015   Migraines    Motion sickness    MSSA bacteremia 09/20/2022   Osteomyelitis (HCC) 09/18/2022   Other acute osteomyelitis, left ankle and foot (HCC) 02/13/2022   Panic attacks    Peripheral edema 04/27/2018   Pneumonia    PONV (postoperative nausea and vomiting)    Sepsis secondary to diabetic foot infection 03/13/2015   Sepsis secondary to diabetic foot infection 03/13/2015   T2DM (type 2 diabetes mellitus) (HCC)    Takotsubo cardiomyopathy / transient apical balooning syndrome / stress-induced cardiomyopathy 05/10/2014   Unspecified essential hypertension     SURGICAL HISTORY: Past Surgical History:  Procedure Laterality Date   AMPUTATION Left 09/19/2022   Procedure: AMPUTATION BELOW KNEE;  Surgeon: Jama Cordella MATSU, MD;  Location: [facility];  Service: Vascular;  Laterality: Left;   AMPUTATION Left 09/24/2022   Procedure: AMPUTATION BELOW KNEE WITH WOUND CLOSURE;  Surgeon: Jama Cordella MATSU, MD;  Location: [facility];  Service: Vascular;  Laterality: Left;   AMPUTATION TOE Left 03/16/2015   Procedure: left fifth toe amputation with incision and drainage;  Surgeon: Eva Gay, DPM;  Location: [facility];  Service: Podiatry;   Laterality: Left;   APPLICATION OF WOUND VAC Left 02/14/2021   Procedure: APPLICATION OF WOUND VAC;  Surgeon: Gay Eva, DPM;  Location: [facility];  Service: Podiatry;  Laterality: Left;   APPLICATION OF WOUND VAC Left 04/16/2021   Procedure: APPLICATION OF WOUND VAC;  Surgeon: Lowery Estefana RAMAN, DO;  Location: [facility];  Service: Plastics;  Laterality: Left;   CARDIAC CATHETERIZATION Left 06/04/2009   Procedure: CARDIAC CATHETERIZATION; Location: [facility]   CHOLECYSTECTOMY     DEBRIDEMENT AND CLOSURE WOUND Left 04/16/2021   Procedure: DEBRIDEMENT AND CLOSURE WOUND;  Surgeon: Lowery Estefana RAMAN, DO;  Location: [facility];  Service: Plastics;  Laterality: Left;  1 hour   IR IMAGING GUIDED PORT INSERTION  11/18/2022   IRRIGATION AND DEBRIDEMENT FOOT Left 02/08/2021   Procedure: IRRIGATION AND DEBRIDEMENT FOOT - LFT HEEL ULCER;  Surgeon: Gay Eva, DPM;  Location: [facility];  Service: Podiatry;  Laterality: Left;   IRRIGATION AND DEBRIDEMENT FOOT Left 02/14/2021   Procedure: IRRIGATION AND DEBRIDEMENT LEFT HEEL;  Surgeon: Gay Eva, DPM;  Location: [facility];  Service: Podiatry;  Laterality: Left;   kidney stone removal     KNEE ARTHROSCOPY W/ MENISCAL REPAIR     NOSE SURGERY  01/07/1989   due to fracture   PERICARDIOCENTESIS N/A 12/26/2023   Procedure: PERICARDIOCENTESIS;  Surgeon: Darron Deatrice LABOR, MD;  Location: [facility];  Service: Cardiovascular;  Laterality: N/A;   TEE WITHOUT CARDIOVERSION N/A 09/26/2022   Procedure: TRANSESOPHAGEAL ECHOCARDIOGRAM;  Surgeon: Dewane Shiner, DO;  Location: [facility];  Service: Cardiovascular;  Laterality: N/A;   TOE AMPUTATION     second toe   TONSILLECTOMY     TOTAL KNEE ARTHROPLASTY  01/11/2011   Procedure: TOTAL KNEE ARTHROPLASTY;  Surgeon: Norleen LITTIE Gavel;  Location: [facility];  Service: Orthopedics;  Laterality: Right;  COMPUTER ASSISTED TOTAL KNEE REPLACEMENT    SOCIAL  HISTORY: Social History   Socioeconomic History   Marital status: Married    Spouse name: Not on file   Number of children: Not on file   Years of education: Not on file   Highest education level: Not on file  Occupational History   Not on file  Tobacco Use   Smoking status: Former    Current packs/day: 0.00    Types: Cigarettes    Quit date: 03/12/1985    Years since quitting: 38.8   Smokeless tobacco: Never  Vaping Use   Vaping status: Never Used  Substance and Sexual Activity   Alcohol use: No   Drug use: No   Sexual activity: Yes    Birth control/protection: Post-menopausal  Other Topics Concern   Not on file  Social History Narrative   Wife, Barnie, at bedside. One indoor pet, dog.   Social Drivers of Health   Tobacco Use: Medium Risk (01/20/2024)   Patient History    Smoking Tobacco Use: Former    Smokeless Tobacco Use: Never    Passive Exposure: Not on file  Financial Resource Strain: Medium Risk (01/07/2024)   Received from [facility]   Overall  Financial Resource Strain (CARDIA)    Difficulty of Paying Living Expenses: Somewhat hard  Food Insecurity: No Food Insecurity (01/07/2024)   Received from [facility]   Epic    Within the past 12 months, you worried that your food would run out before you got the money to buy more.: Never true    Within the past 12 months, the food you bought just didn't last and you didn't have money to get more.: Never true  Transportation Needs: No Transportation Needs (01/07/2024)   Received from [facility] - Transportation    In the past 12 months, has lack of transportation kept you from medical appointments or from getting medications?: No    Lack of Transportation (Non-Medical): No  Recent Concern: Transportation Needs - Unmet Transportation Needs (12/01/2023)   Received from [facility] - Transportation    In the past 12 months,  has lack of transportation kept you from medical appointments or from getting medications?: Yes    Lack of Transportation (Non-Medical): Yes  Physical Activity: Not on file  Stress: Not on file  Social Connections: Not on file  Intimate Partner Violence: Not At Risk (12/27/2023)   Epic    Fear of Current or Ex-Partner: No    Emotionally Abused: No    Physically Abused: No    Sexually Abused: No  Depression (PHQ2-9): Low Risk (11/04/2023)   Depression (PHQ2-9)    PHQ-2 Score: 1  Alcohol Screen: Low Risk (12/24/2023)   Alcohol Screen    Last Alcohol Screening Score (AUDIT): 0  Housing: Low Risk  (01/07/2024)   Received from [facility]   Epic    In the last 12 months, was there a time when you were not able to pay the mortgage or rent on time?: No    In the past 12 months, how many times have you moved where you were living?: 0    At any time in the past 12 months, were you homeless or living in a shelter (including now)?: No  Utilities: Not At Risk (01/07/2024)   Received from [facility]   Epic    In the past 12 months has the electric, gas, oil, or water  company threatened to shut off services in your home?: No  Health Literacy: Not on file    FAMILY HISTORY: Family History  Problem Relation Age of Onset   Transient ischemic attack Mother    Asthma Mother    Hypertension Mother    Lung cancer Father 62   Hypertension Father    Asthma Sister    Anxiety disorder Sister    Asthma Sister    Hypertension Brother    Ovarian cancer Maternal Aunt 40   Melanoma Maternal Uncle        dx 74s   Lung cancer Paternal Aunt 68   Stomach cancer Maternal Grandmother    Colon cancer Maternal Grandmother    Lymphoma Maternal Grandmother    Melanoma Maternal Grandfather 72       sun exposure   Lung cancer Paternal Grandmother 7   Kidney cancer Paternal Grandfather        dx early   Crohn's disease Other    GER disease Other    Breast cancer Neg  Hx     ALLERGIES:  is allergic to shellfish allergy, codeine, morphine  and codeine, rosuvastatin, metformin  and related, percocet [oxycodone -acetaminophen ], sulfa antibiotics, and theophyllines.  MEDICATIONS:  Current Outpatient Medications  Medication Sig Dispense Refill   acetaminophen  (TYLENOL ) 325 MG tablet Take 2 tablets (650 mg total) by mouth every 6 (six) hours as needed for mild pain (pain score 1-3) (or Fever >/= 101). 30 tablet 0   acidophilus (RISAQUAD) CAPS capsule Take 1 capsule by mouth daily.     ALPRAZolam  (XANAX ) 0.5 MG tablet Take 0.5-1 tablets (0.25-0.5 mg total) by mouth 2 (two) times daily as needed for anxiety. Take 1/2 to 1 tablet by mouth as needed for panic attacks 60 tablet 2   amiodarone  (PACERONE ) 200 MG tablet Take 2 tablets (400 mg total) by mouth 2 (two) times daily for 4 days, THEN 1 tablet (200 mg total) daily. (Patient taking differently: Take 1 tablets (200 mg total) by mouth 2 (two) times daily) 106 tablet 0   apixaban  (ELIQUIS ) 5 MG TABS tablet Take 1 tablet (5 mg total) by mouth 2 (two) times daily. 60 tablet 1   carvedilol  (COREG ) 6.25 MG tablet TAKE 1 TABLET BY MOUTH TWICE DAILY 180 tablet 1   colchicine  0.6 MG tablet Take 0.6 mg by mouth 2 (two) times daily.     desvenlafaxine  (PRISTIQ ) 100 MG 24 hr tablet Take 1 tablet (100 mg total) by mouth daily. 90 tablet 3   diphenoxylate -atropine  (LOMOTIL ) 2.5-0.025 MG tablet Take 1 tablet by mouth 4 (four) times daily as needed for diarrhea or loose stools. 30 tablet 0   Ensure Max Protein (ENSURE MAX PROTEIN) LIQD Take 330 mLs (11 oz total) by mouth 2 (two) times daily.     fenofibrate  54 MG tablet TAKE 1 TABLET BY MOUTH EVERY DAY 30 tablet 11   furosemide (LASIX) 40 MG tablet Take 40 mg by mouth.     gabapentin  (NEURONTIN ) 300 MG capsule TAKE 1 CAPSULE BY MOUTH EVERY NIGHT AT BEDTIME (Patient taking differently: Take 300 mg by mouth as needed.) 60 capsule 0   insulin  NPH-regular Human (70-30) 100 UNIT/ML  injection Inject 20 Units into the skin 2 (two) times daily with a meal. (Patient taking differently: Inject 70 Units into the skin 2 (two) times daily with a meal.)     lisinopril  (ZESTRIL ) 5 MG tablet Take 1 tablet (5 mg total) by mouth 2 (two) times daily. 30 tablet 1   magnesium  oxide (MAG-OX) 400 MG tablet Take 400 mg by mouth.     montelukast  (SINGULAIR ) 10 MG tablet Take 1 tablet (10 mg total) by mouth daily. 90 tablet 1   nystatin  (MYCOSTATIN /NYSTOP ) powder APPLY TOPICALLY TWICE DAILY AS NEEDED 30 g 3   ondansetron  (ZOFRAN -ODT) 8 MG disintegrating tablet Take 1 tablet (8 mg total) by mouth every 8 (eight) hours as needed for nausea or vomiting. 90 tablet 3   pantoprazole  (PROTONIX ) 40 MG tablet Take 1 tablet (40 mg total) by mouth daily. 90 tablet 3   potassium chloride  (KLOR-CON  M) 10 MEQ tablet Take 10 mEq by mouth.     pravastatin  (PRAVACHOL ) 40 MG tablet TAKE 1 TABLET BY MOUTH EVERY DAY 30 tablet 5   prochlorperazine  (COMPAZINE ) 10 MG tablet Take 1 tablet (10 mg total) by mouth every 6 (six) hours as needed for nausea or vomiting. 90 tablet 1   Ubrogepant  (UBRELVY ) 100 MG TABS Take 1 tablet (100 mg total) by mouth as needed (migraine). 12 tablet 1   albuterol  (PROVENTIL  HFA;VENTOLIN  HFA) 108 (90 Base) MCG/ACT inhaler Inhale 2 puffs into the lungs every 4 (four) hours as needed for wheezing or shortness of breath. Reported  on 03/29/2015 (Patient not taking: Reported on 01/20/2024) 1 Inhaler 3   albuterol  (PROVENTIL ) (2.5 MG/3ML) 0.083% nebulizer solution Take 2.5 mg by nebulization as needed. (Patient not taking: Reported on 01/20/2024)     aspirin  EC 81 MG tablet Take 81 mg by mouth daily. (Patient not taking: Reported on 01/20/2024)     Continuous Glucose Sensor (FREESTYLE LIBRE 2 SENSOR) MISC 1 each by Does not apply route every 14 (fourteen) days. (Patient not taking: Reported on 01/20/2024) 2 each 3   lidocaine  4 % Place 1 patch onto the skin daily as needed (for pain). (Patient not taking:  Reported on 01/20/2024)     lidocaine -prilocaine  (EMLA ) cream Apply to affected area once (Patient not taking: Reported on 01/20/2024) 30 g 3   metFORMIN  (GLUCOPHAGE ) 500 MG tablet TAKE 2 TABLET BY MOUTH IN THE MORNING AND 3 TABLET BY MOUTH IN THE EVENING (Patient not taking: Reported on 01/20/2024) 450 tablet 1   methocarbamol  (ROBAXIN ) 500 MG tablet Take 1 tablet (500 mg total) by mouth every 8 (eight) hours as needed for muscle spasms. (Patient not taking: Reported on 01/20/2024) 30 tablet 0   mirtazapine  (REMERON ) 7.5 MG tablet TAKE 1 TABLET BY MOUTH EVERY NIGHT AT BEDTIME (Patient not taking: Reported on 01/20/2024) 90 tablet 2   MOUNJARO  7.5 MG/0.5ML Pen ADMINISTER 7.5 MG UNDER THE SKIN 1 TIME A WEEK (Patient not taking: Reported on 01/20/2024) 2 mL 0   Multiple Vitamin (MULTIVITAMIN WITH MINERALS) TABS tablet Take 1 tablet by mouth daily. (Patient not taking: Reported on 01/20/2024) 30 tablet 2   naloxone  (NARCAN ) nasal spray 4 mg/0.1 mL SPRAY 1 SPRAY INTO ONE NOSTRIL AS DIRECTED FOR OPIOID OVERDOSE (TURN PERSON ON SIDE AFTER DOSE. IF NO RESPONSE IN 2-3 MINUTES OR PERSON RESPONDS BUT RELAPSES, REPEAT USING A NEW SPRAY DEVICE AND SPRAY INTO THE OTHER NOSTRIL. CALL 911 AFTER USE.) * EMERGENCY USE ONLY * (Patient not taking: Reported on 01/20/2024) 1 each 0   nystatin  ointment (MYCOSTATIN ) Apply 1 Application topically 2 (two) times daily. (Patient not taking: Reported on 01/20/2024) 30 g 0   olaparib  (LYNPARZA ) 100 MG tablet Take 1 tablet (100 mg total) by mouth 2 (two) times daily. Swallow whole. May take with food to decrease nausea and vomiting. Take with 150mg  tablet (Patient not taking: Reported on 01/20/2024) 60 tablet 0   olaparib  (LYNPARZA ) 150 MG tablet Take 1 tablet (150 mg total) by mouth 2 (two) times daily. Swallow whole. May take with food to decrease nausea and vomiting. Take with 100 mg tablet (Patient not taking: Reported on 01/20/2024) 60 tablet 0   ondansetron  (ZOFRAN ) 8 MG tablet Take 1  tablet (8 mg total) by mouth every 8 (eight) hours as needed for nausea or vomiting. Start on the third day after carboplatin . (Patient not taking: Reported on 01/20/2024) 30 tablet 1   oxyCODONE  (OXY IR/ROXICODONE ) 5 MG immediate release tablet Take 0.5-1 tablets (2.5-5 mg total) by mouth every 4 (four) hours as needed for moderate pain (pain score 4-6) or severe pain (pain score 7-10). (Patient not taking: Reported on 01/20/2024) 8 tablet 0   zinc  sulfate 220 (50 Zn) MG capsule Take 1 capsule (220 mg total) by mouth daily. (Patient not taking: Reported on 01/20/2024) 30 capsule 0   No current facility-administered medications for this visit.    Review of Systems  Constitutional:  Positive for appetite change and fatigue. Negative for chills, fever and unexpected weight change.  HENT:   Negative for hearing loss and  voice change.   Eyes:  Negative for eye problems.  Respiratory:  Positive for shortness of breath. Negative for chest tightness and cough.   Cardiovascular:  Positive for palpitations. Negative for chest pain and leg swelling.  Gastrointestinal:  Positive for nausea. Negative for abdominal distention, abdominal pain, blood in stool and diarrhea.  Endocrine: Negative for hot flashes.  Genitourinary:  Negative for difficulty urinating and frequency.   Musculoskeletal:  Negative for arthralgias and neck pain.       S/p left  BKA   Skin:  Negative for itching and rash.  Neurological:  Positive for numbness. Negative for extremity weakness.  Hematological:  Negative for adenopathy.  Psychiatric/Behavioral:  Negative for confusion.      PHYSICAL EXAMINATION: ECOG PERFORMANCE STATUS: 1 - Symptomatic but completely ambulatory  Vitals:   01/20/24 1030 01/20/24 1050  BP: (!) 152/76 131/71  Pulse: 77   Resp: 18   Temp: 97.8 F (36.6 C)   SpO2: 97%    Filed Weights   01/20/24 1030  Weight: 277 lb 1.6 oz (125.7 kg)    Physical Exam Constitutional:      General: She is not in  acute distress.    Appearance: She is obese. She is not diaphoretic.  HENT:     Head: Normocephalic and atraumatic.  Eyes:     General: No scleral icterus. Cardiovascular:     Rate and Rhythm: Normal rate.  Pulmonary:     Effort: Pulmonary effort is normal. No respiratory distress.     Breath sounds: Normal breath sounds.  Abdominal:     General: There is no distension.     Palpations: Abdomen is soft.     Tenderness: There is no abdominal tenderness.  Musculoskeletal:        General: Normal range of motion.     Cervical back: Normal range of motion and neck supple.     Comments: S/p left BKA  Skin:    General: Skin is warm and dry.     Findings: No erythema.  Neurological:     Mental Status: She is alert and oriented to person, place, and time. Mental status is at baseline.     Cranial Nerves: No cranial nerve deficit.     Motor: No abnormal muscle tone.  Psychiatric:        Mood and Affect: Affect normal.      LABORATORY DATA:  I have reviewed the data as listed    Latest Ref Rng & Units 01/15/2024    1:13 PM 01/07/2024   11:11 AM 12/30/2023    4:00 AM  CBC  WBC 4.0 - 10.5 K/uL 7.4  9.3  6.4   Hemoglobin 12.0 - 15.0 g/dL 87.6  88.5  9.3   Hematocrit 36.0 - 46.0 % 38.9  35.9  30.3   Platelets 150 - 400 K/uL 330  400  280       Latest Ref Rng & Units 01/15/2024    1:13 PM 01/07/2024   11:11 AM 12/30/2023    4:00 AM  CMP  Glucose 70 - 99 mg/dL 96  830  876   BUN 6 - 20 mg/dL 19  24  14    Creatinine 0.44 - 1.00 mg/dL 8.94  9.02  9.14   Sodium 135 - 145 mmol/L 141  140  141   Potassium 3.5 - 5.1 mmol/L 4.2  4.7  4.0   Chloride 98 - 111 mmol/L 101  102  102   CO2 22 -  32 mmol/L 27  20  32   Calcium 8.9 - 10.3 mg/dL 9.6  9.3  8.5   Total Protein 6.5 - 8.1 g/dL 7.7  6.9    Total Bilirubin 0.0 - 1.2 mg/dL 0.3  0.2    Alkaline Phos 38 - 126 U/L 73  80    AST 15 - 41 U/L 30  18    ALT 0 - 44 U/L 28  17       RADIOGRAPHIC STUDIES: I have personally reviewed the  radiological images as listed and agreed with the findings in the report. ECHOCARDIOGRAM LIMITED Result Date: 12/28/2023    ECHOCARDIOGRAM LIMITED REPORT   Patient Name:   Nancy [name] Date of Exam: 12/28/2023 Medical Rec #:  [id number]           Height:       69.0 in Accession #:    [id number]          Weight:       288.0 lb Date of Birth:  [birth year]          BSA:          2.412 m Patient Age:    58 years            BP:           151/65 mmHg Patient Gender: F                   HR:           66 bpm. Exam Location:  [facility] Procedure: 2D Echo and Cardiac Doppler (Both Spectral and Color Flow Doppler            were utilized during procedure). Indications:     Pericardial effusion I31.3  History:         Patient has prior history of Echocardiogram examinations, most                  recent 12/26/2023.  Sonographer:     Thedora Louder RDCS, FASE Referring Phys:  [id number] FLYJFFJI A ARIDA Diagnosing Phys: Denyse Bathe  Sonographer Comments: Technically difficult study due to poor echo windows. Image acquisition challenging due to patient body habitus. This study was performed with patient supine. IMPRESSIONS  1. Left ventricular ejection fraction, by estimation, is 55 to 60%. The left ventricle has normal function. Left ventricular diastolic function could not be evaluated.  2. Left atrial size was mild to moderately dilated.  3. Right atrial size was mild to moderately dilated.  4. A small pericardial effusion is present. The pericardial effusion is circumferential. There is no evidence of cardiac tamponade.  5. The mitral valve was not assessed.  6. The aortic valve was not assessed. FINDINGS  Left Ventricle: Left ventricular ejection fraction, by estimation, is 55 to 60%. The left ventricle has normal function. Left ventricular diastolic function could not be evaluated. Left Atrium: Left atrial size was mild to moderately dilated. Right Atrium: Right atrial size was mild to moderately dilated. Pericardium: A  small pericardial effusion is present. The pericardial effusion is circumferential. There is no evidence of cardiac tamponade. Mitral Valve: The mitral valve was not assessed. Tricuspid Valve: The tricuspid valve is not assessed. Aortic Valve: The aortic valve was not assessed. Additional Comments: Spectral Doppler performed.  LEFT VENTRICLE PLAX 2D LVIDd:         4.40 cm LVIDs:         3.10 cm LV PW:  1.30 cm LV IVS:        1.30 cm  LEFT ATRIUM         Index LA diam:    3.90 cm 1.62 cm/m Denyse Bathe Electronically signed by Denyse Bathe Signature Date/Time: 12/28/2023/10:51:10 AM    Final    CT ABDOMEN PELVIS WO CONTRAST Result Date: 12/27/2023 CLINICAL DATA:  Nonlocalized abdominal pain. Hypertension. Below-the-knee amputation. Morbid obesity. Stage III ovarian cancer on chemotherapy. Pericardial effusion. * Tracking Code: BO * EXAM: CT ABDOMEN AND PELVIS WITHOUT CONTRAST TECHNIQUE: Multidetector CT imaging of the abdomen and pelvis was performed following the standard protocol without IV contrast. RADIATION DOSE REDUCTION: This exam was performed according to the departmental dose-optimization program which includes automated exposure control, adjustment of the mA and/or kV according to patient size and/or use of iterative reconstruction technique. COMPARISON:  12/24/2023 FINDINGS: Lower chest: Bibasilar atelectasis. Decrease in tiny pericardial effusion. Increase in small right and new small left pleural effusions. Compressive bibasilar atelectasis. Hepatobiliary: Moderate hepatic steatosis. Hepatomegaly at 18.6 cm craniocaudal. Cholecystectomy, without biliary ductal dilatation. Pancreas: Normal, without mass or ductal dilatation. Spleen: Normal in size, without focal abnormality. Adrenals/Urinary Tract: Normal right adrenal gland. Fat density left adrenal nodule of 2.7 cm is consistent with a benign myelolipoma. Punctate left renal collecting system calculus. Right renal vascular calcification. No  hydronephrosis. No hydroureter or ureteric calculi. No bladder calculi. Stomach/Bowel: Normal stomach, without wall thickening. Dense contrast within the colon, which is normal in caliber. Normal terminal ileum. The appendix measures up to 7 mm, without surrounding inflammation on image 67/5. Normal small bowel. Vascular/Lymphatic: Advanced aortic and branch vessel atherosclerosis. Small abdominal retroperitoneal nodes, none pathologic by size criteria. Reproductive: Hysterectomy.  No adnexal mass. Other: No significant free fluid. Mild pelvic floor laxity. No evidence of omental or peritoneal disease. Mild anasarca. Musculoskeletal: Lumbosacral spondylosis. IMPRESSION: 1. Normal appendix, without acute process in the abdomen or pelvis. 2. No evidence of metastatic disease, status post hysterectomy. 3. Decreased tiny pericardial effusion. Increased right and new left pleural effusions, both small. 4. Left nephrolithiasis 5. Hepatic steatosis 6.  Aortic Atherosclerosis (ICD10-I70.0). Electronically Signed   By: Rockey Kilts M.D.   On: 12/27/2023 15:16   ECHOCARDIOGRAM LIMITED Result Date: 12/26/2023    ECHOCARDIOGRAM LIMITED REPORT   Patient Name:   Nancy [name] Date of Exam: 12/26/2023 Medical Rec #:  [id number]           Height:       69.0 in Accession #:    [id number]          Weight:       288.0 lb Date of Birth:  07-May-1965          BSA:          2.412 m Patient Age:    58 years            BP:           145/90 mmHg Patient Gender: F                   HR:           70 bpm. Exam Location:  [facility] Procedure: Limited Echo, Color Doppler and Saline Contrast Bubble Study (Both            Spectral and Color Flow Doppler were utilized during procedure). Indications:     Pericardial effusion I31.3  History:         Patient has prior history of Echocardiogram  examinations, most                  recent 12/25/2023.  Sonographer:     Ashley McNeely-Sloane Referring Phys:  [id number] CARALYN HUDSON Diagnosing Phys:  Deatrice Cage MD IMPRESSIONS  1. Left ventricular ejection fraction, by estimation, is 55 to 60%. The left ventricle has normal function. The left ventricle has no regional wall motion abnormalities. Left ventricular diastolic function could not be evaluated.  2. Right ventricular systolic function is normal. The right ventricular size is normal.  3. Large pericardial effusion initially noted. post pericardiocentesis, effusion is small with no tamponade. Agitated saline was injected via the catheter which confirmed position in pericardial space.  4. The mitral valve was not well visualized. No evidence of mitral valve regurgitation. No evidence of mitral stenosis.  5. The aortic valve was not well visualized. Aortic valve regurgitation is not visualized. No aortic stenosis is present. FINDINGS  Left Ventricle: Left ventricular ejection fraction, by estimation, is 55 to 60%. The left ventricle has normal function. The left ventricle has no regional wall motion abnormalities. The left ventricular internal cavity size was normal in size. There is  no left ventricular hypertrophy. Left ventricular diastolic function could not be evaluated. Right Ventricle: The right ventricular size is normal. No increase in right ventricular wall thickness. Right ventricular systolic function is normal. Left Atrium: Left atrial size was normal in size. Right Atrium: Right atrial size was normal in size. Pericardium: A large pericardial effusion is present. There is no evidence of cardiac tamponade. Mitral Valve: The mitral valve was not well visualized. Mild mitral annular calcification. No evidence of mitral valve stenosis. Tricuspid Valve: The tricuspid valve is not well visualized. Tricuspid valve regurgitation is not demonstrated. No evidence of tricuspid stenosis. Aortic Valve: The aortic valve was not well visualized. Aortic valve regurgitation is not visualized. No aortic stenosis is present. Pulmonic Valve: The pulmonic valve  was not well visualized. Pulmonic valve regurgitation is not visualized. No evidence of pulmonic stenosis. Aorta: The aortic root is normal in size and structure. IAS/Shunts: No atrial level shunt detected by color flow Doppler. Agitated saline contrast was given intravenously to evaluate for intracardiac shunting. Deatrice Cage MD Electronically signed by Deatrice Cage MD Signature Date/Time: 12/26/2023/2:38:28 PM    Final    CARDIAC CATHETERIZATION Result Date: 12/26/2023 Successful pericardiocentesis via the subxiphoid area.  A total of 940 mL of bloody fluid was removed. Recommendations: Fluid was sent to the lab for analysis including cytology. Continue drainage to gravity and if output is less than 50 mL in 24 hours and echocardiogram shows resolution of pericardial effusion, the drain can be removed. I ordered her repeat limited echocardiogram to be done tomorrow. Recommend holding anticoagulation for at least 24 hours given bloody effusion.   ECHOCARDIOGRAM COMPLETE Result Date: 12/25/2023    ECHOCARDIOGRAM REPORT   Patient Name:   Nancy [name] Date of Exam: 12/25/2023 Medical Rec #:  [id number]           Height:       69.0 in Accession #:    [id number]          Weight:       288.0 lb Date of Birth:  04-Nov-1965          BSA:          2.412 m Patient Age:    58 years            BP:  127/90 mmHg Patient Gender: F                   HR:           86 bpm. Exam Location:  [facility] Procedure: 2D Echo, Cardiac Doppler, Color Doppler and Intracardiac            Opacification Agent (Both Spectral and Color Flow Doppler were            utilized during procedure). Indications:     Pericardial effusion I31.3  History:         Patient has prior history of Echocardiogram examinations, most                  recent 09/22/2022. Ovarian cancer, Arrythmias:Atrial                  Fibrillation; Signs/Symptoms:Dyspnea.  Sonographer:     Ashley McNeely-Sloane Referring Phys:  [id number] CARALYN HUDSON Diagnosing  Phys: Marsa Dooms MD IMPRESSIONS  1. Left ventricular ejection fraction, by estimation, is 55 to 60%. The left ventricle has normal function. The left ventricle has no regional wall motion abnormalities. Left ventricular diastolic parameters are consistent with Grade I diastolic dysfunction (impaired relaxation).  2. Right ventricular systolic function is normal. The right ventricular size is normal.  3. Large pericardial effusion. The pericardial effusion is circumferential. There is no evidence of cardiac tamponade.  4. The mitral valve is normal in structure. Trivial mitral valve regurgitation. No evidence of mitral stenosis.  5. The aortic valve is normal in structure. Aortic valve regurgitation is not visualized. No aortic stenosis is present.  6. The inferior vena cava is normal in size with greater than 50% respiratory variability, suggesting right atrial pressure of 3 mmHg. FINDINGS  Left Ventricle: Left ventricular ejection fraction, by estimation, is 55 to 60%. The left ventricle has normal function. The left ventricle has no regional wall motion abnormalities. Definity  contrast agent was given IV to delineate the left ventricular  endocardial borders. Strain was performed and the global longitudinal strain is indeterminate. The left ventricular internal cavity size was normal in size. There is no left ventricular hypertrophy. Left ventricular diastolic parameters are consistent with Grade I diastolic dysfunction (impaired relaxation). Right Ventricle: The right ventricular size is normal. No increase in right ventricular wall thickness. Right ventricular systolic function is normal. Left Atrium: Left atrial size was normal in size. Right Atrium: Right atrial size was normal in size. Pericardium: A large pericardial effusion is present. The pericardial effusion is circumferential. There is no evidence of cardiac tamponade. Mitral Valve: The mitral valve is normal in structure. Trivial mitral valve  regurgitation. No evidence of mitral valve stenosis. MV peak gradient, 3.6 mmHg. The mean mitral valve gradient is 2.0 mmHg. Tricuspid Valve: The tricuspid valve is normal in structure. Tricuspid valve regurgitation is trivial. No evidence of tricuspid stenosis. Aortic Valve: The aortic valve is normal in structure. Aortic valve regurgitation is not visualized. No aortic stenosis is present. Aortic valve mean gradient measures 3.0 mmHg. Aortic valve peak gradient measures 4.9 mmHg. Aortic valve area, by VTI measures 3.23 cm. Pulmonic Valve: The pulmonic valve was normal in structure. Pulmonic valve regurgitation is not visualized. No evidence of pulmonic stenosis. Aorta: The aortic root is normal in size and structure. Venous: The inferior vena cava is normal in size with greater than 50% respiratory variability, suggesting right atrial pressure of 3 mmHg. IAS/Shunts: No atrial level shunt detected by color flow Doppler. Additional  Comments: 3D was performed not requiring image post processing on an independent workstation and was indeterminate.  LEFT VENTRICLE PLAX 2D LVIDd:         2.90 cm     Diastology LVIDs:         2.10 cm     LV e' medial:    6.31 cm/s LV PW:         2.20 cm     LV E/e' medial:  14.8 LV IVS:        1.30 cm     LV e' lateral:   5.44 cm/s LVOT diam:     2.10 cm     LV E/e' lateral: 17.1 LV SV:         59 LV SV Index:   24 LVOT Area:     3.46 cm  LV Volumes (MOD) LV vol d, MOD A2C: 77.9 ml LV vol s, MOD A2C: 20.5 ml LV SV MOD A2C:     57.4 ml RIGHT VENTRICLE             IVC RV S prime:     18.50 cm/s  IVC diam: 2.30 cm TAPSE (M-mode): 2.1 cm LEFT ATRIUM           Index        RIGHT ATRIUM           Index LA Vol (A4C): 71.1 ml 29.47 ml/m  RA Area:     22.70 cm                                    RA Volume:   68.80 ml  28.52 ml/m  AORTIC VALVE                    PULMONIC VALVE AV Area (Vmax):    3.32 cm     PV Vmax:        0.89 m/s AV Area (Vmean):   3.20 cm     PV Vmean:       63.700 cm/s  AV Area (VTI):     3.23 cm     PV VTI:         0.158 m AV Vmax:           110.50 cm/s  PV Peak grad:   3.2 mmHg AV Vmean:          75.650 cm/s  PV Mean grad:   2.0 mmHg AV VTI:            0.181 m      RVOT Peak grad: 2 mmHg AV Peak Grad:      4.9 mmHg AV Mean Grad:      3.0 mmHg LVOT Vmax:         106.00 cm/s LVOT Vmean:        69.800 cm/s LVOT VTI:          0.169 m LVOT/AV VTI ratio: 0.93  AORTA Ao Root diam: 2.90 cm Ao Asc diam:  3.80 cm MITRAL VALVE MV Area (PHT): 3.17 cm     SHUNTS MV Area VTI:   3.68 cm     Systemic VTI:  0.17 m MV Peak grad:  3.6 mmHg     Systemic Diam: 2.10 cm MV Mean grad:  2.0 mmHg     Pulmonic VTI:  0.119 m MV Vmax:       0.95 m/s  MV Vmean:      58.9 cm/s MV Decel Time: 239 msec MV E velocity: 93.20 cm/s MV A velocity: 112.00 cm/s MV E/A ratio:  0.83 Marsa Dooms MD Electronically signed by Marsa Dooms MD Signature Date/Time: 12/25/2023/4:32:43 PM    Final    DG Chest 2 View Result Date: 12/24/2023 EXAM: 2 VIEW(S) XRAY OF THE CHEST 12/24/2023 06:38:21 PM COMPARISON: None available. CLINICAL HISTORY: shortness of breath FINDINGS: LINES, TUBES AND DEVICES: Right IJ approach Port-A-Cath in place with tip projecting at the superior right atrium. LUNGS AND PLEURA: No focal pulmonary opacity. No pleural effusion. No pneumothorax. HEART AND MEDIASTINUM: Cardiomegaly. Aortic atherosclerosis. BONES AND SOFT TISSUES: No acute osseous abnormality. IMPRESSION: 1. Right IJ approach Port-A-Cath with tip projecting at the superior right atrium. 2. Cardiomegaly. 3. Aortic atherosclerosis. Electronically signed by: Greig Pique MD 12/24/2023 06:51 PM EST RP Workstation: [id number]   CT CHEST ABDOMEN PELVIS W CONTRAST Result Date: 12/24/2023 EXAM: CT CHEST, ABDOMEN AND PELVIS WITH CONTRAST 12/24/2023 06:14:55 PM TECHNIQUE: CT of the chest, abdomen and pelvis was performed with the administration of 100 mL of iohexol  (OMNIPAQUE ) 300 MG/ML solution. Multiplanar reformatted images are  provided for review. Automated exposure control, iterative reconstruction, and/or weight based adjustment of the mA/kV was utilized to reduce the radiation dose to as low as reasonably achievable. COMPARISON: CT chest abdomen and pelvis 10/22/2023. CLINICAL HISTORY: ovarian cancer ovarian cancer FINDINGS: CHEST: MEDIASTINUM AND LYMPH NODES: There is a new large pericardial effusion. Right chest port catheter tip ends in the distal SVC. The central airways are clear. No mediastinal, hilar or axillary lymphadenopathy. LUNGS AND PLEURA: There is a new small right pleural effusion. There is a calcified granuloma in the right upper lobe. No focal consolidation or pulmonary edema. No pneumothorax. ABDOMEN AND PELVIS: LIVER: The liver is unremarkable. GALLBLADDER AND BILE DUCTS: The gallbladder is surgically absent. No biliary ductal dilatation. SPLEEN: No acute abnormality. PANCREAS: No acute abnormality. ADRENAL GLANDS: Low attenuation left adrenal adenoma or myelolipoma measures 3 cm; this is better seen on today's study when compared to the prior examination. The right adrenal gland appears normal. KIDNEYS, URETERS AND BLADDER: There is a small fat containing lesion in the left kidney posteriorly measuring 9 mm, likely angiomyolipoma. Subcentimeter hypodensity may represent a cyst in the inferior pole of the left kidney. There is a 5 mm fat containing lesion in the inferior pole of the right kidney which is unchanged, likely angiomyolipoma. No stones in the kidneys or ureters. No hydronephrosis. No perinephric or periureteral stranding. Urinary bladder is unremarkable. GI AND BOWEL: The appendix is prominent in size measuring up to 8 mm. There is some trace surrounding inflammatory stranding. There is no fluid collection or perforation. Stomach demonstrates no acute abnormality. There is no bowel obstruction. REPRODUCTIVE ORGANS: The uterus is surgically absent. The ovaries are not definitively seen. PERITONEUM AND  RETROPERITONEUM: There is trace free fluid in the right upper quadrant which is new from prior. No free air. VASCULATURE: Aorta is normal in caliber. There are atherosclerotic calcifications of the aorta. ABDOMINAL AND PELVIS LYMPH NODES: There are scattered nonenlarged retroperitoneal lymph nodes which are similar to the prior study. BONES AND SOFT TISSUES: No acute osseous abnormality. No focal soft tissue abnormality. IMPRESSION: 1. New large pericardial effusion. 2. New small right pleural effusion. 3. Prominent appendix with trace surrounding inflammatory stranding, without fluid collection or perforation. Please correlate clinically for early acute appendicitis. 4. Low attenuation 3 cm left adrenal lesion, favored adenoma or myelolipoma.  5. No evidence of metastatic disease. Electronically signed by: Greig Pique MD 12/24/2023 06:42 PM EST RP Workstation: [id number]       "

## 2024-01-20 NOTE — Assessment & Plan Note (Addendum)
 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 Post treatment CT showed excellent response. S/p Debulking R1 on 02/20/2023, s/p 4 cycles of adjuvant carboplatin  taxol   She did not tolerate Olaparib  300mg  BID due to severe diarrhea.   Previously tolerated 250mg  twice daily. Hold off for now to allow full recovery from acute symptoms.  It is unclear if Olaparib  causes pericarditis, it seems to be less likely.  CA 125 is rising, recent CT in mid Dec did not show radiographic progression. Possible biochemical recurrence. Repeat CT at end of Jan 2026 Shared decision was made to wait until her pericarditis resolved and resume Olaparib .

## 2024-01-20 NOTE — Assessment & Plan Note (Signed)
 LFT has normalized.  Likely reactive to acute viral infection

## 2024-01-20 NOTE — Assessment & Plan Note (Signed)
 History of Takotsubo cardiomyopathy this is likely due to acute viral infection.  Less likely due to Olaparib  12/16/2023 Pericardiocentesis performed,  900 cc bloody fluid drained, cytology was negative for malignancy cells.  On Colchicine  Follow up with cardiology, there is plan to repeat echo in Feb

## 2024-01-21 ENCOUNTER — Other Ambulatory Visit (HOSPITAL_COMMUNITY): Payer: Self-pay

## 2024-01-22 ENCOUNTER — Other Ambulatory Visit: Payer: Self-pay

## 2024-01-23 ENCOUNTER — Other Ambulatory Visit: Payer: Self-pay

## 2024-01-23 ENCOUNTER — Other Ambulatory Visit (HOSPITAL_COMMUNITY): Payer: Self-pay

## 2024-01-23 ENCOUNTER — Other Ambulatory Visit: Payer: Self-pay | Admitting: Family

## 2024-01-28 ENCOUNTER — Other Ambulatory Visit: Payer: Self-pay | Admitting: Family

## 2024-02-04 ENCOUNTER — Inpatient Hospital Stay

## 2024-02-04 ENCOUNTER — Other Ambulatory Visit (HOSPITAL_COMMUNITY): Payer: Self-pay

## 2024-02-04 ENCOUNTER — Telehealth: Payer: Self-pay | Admitting: Obstetrics and Gynecology

## 2024-02-04 NOTE — Telephone Encounter (Signed)
 Due to weather and the ice, pt is unable to make the GYN-ONC appt today 1/28. She has a walker and a prosthetic leg and did not want to fall and get ijured.   Pt would like to be seen sooner than the next available appt (3/18) pt stated she will see either MD for the GYN-ONC   Please advise

## 2024-02-05 ENCOUNTER — Encounter: Payer: Self-pay | Admitting: Oncology

## 2024-02-05 ENCOUNTER — Other Ambulatory Visit: Payer: Self-pay

## 2024-02-06 ENCOUNTER — Telehealth: Payer: Self-pay | Admitting: Obstetrics and Gynecology

## 2024-02-06 NOTE — Telephone Encounter (Signed)
 Called pt to confirm CT/port flush lab for 2/4 - pt confirmed both appts - LH

## 2024-02-11 ENCOUNTER — Ambulatory Visit
Admission: RE | Admit: 2024-02-11 | Discharge: 2024-02-11 | Disposition: A | Source: Ambulatory Visit | Attending: Oncology

## 2024-02-11 ENCOUNTER — Inpatient Hospital Stay: Attending: Oncology

## 2024-02-11 DIAGNOSIS — C569 Malignant neoplasm of unspecified ovary: Secondary | ICD-10-CM

## 2024-02-11 LAB — BASIC METABOLIC PANEL - CANCER CENTER ONLY
Anion gap: 13 (ref 5–15)
BUN: 27 mg/dL — ABNORMAL HIGH (ref 6–20)
CO2: 27 mmol/L (ref 22–32)
Calcium: 9.5 mg/dL (ref 8.9–10.3)
Chloride: 100 mmol/L (ref 98–111)
Creatinine: 1.47 mg/dL — ABNORMAL HIGH (ref 0.44–1.00)
GFR, Estimated: 41 mL/min — ABNORMAL LOW
Glucose, Bld: 155 mg/dL — ABNORMAL HIGH (ref 70–99)
Potassium: 4.7 mmol/L (ref 3.5–5.1)
Sodium: 139 mmol/L (ref 135–145)

## 2024-02-11 MED ORDER — HEPARIN SOD (PORK) LOCK FLUSH 100 UNIT/ML IV SOLN
500.0000 [IU] | Freq: Once | INTRAVENOUS | Status: AC
  Administered 2024-02-11: 500 [IU] via INTRAVENOUS
  Filled 2024-02-11: qty 5

## 2024-02-11 MED ORDER — HEPARIN SOD (PORK) LOCK FLUSH 100 UNIT/ML IV SOLN
INTRAVENOUS | Status: AC
  Filled 2024-02-11: qty 5

## 2024-02-11 MED ORDER — IOHEXOL 300 MG/ML  SOLN
100.0000 mL | Freq: Once | INTRAMUSCULAR | Status: AC | PRN
  Administered 2024-02-11: 100 mL via INTRAVENOUS

## 2024-02-12 ENCOUNTER — Other Ambulatory Visit (HOSPITAL_COMMUNITY): Payer: Self-pay

## 2024-02-12 LAB — CA 125: Cancer Antigen (CA) 125: 18.1 U/mL (ref 0.0–38.1)

## 2024-02-19 ENCOUNTER — Inpatient Hospital Stay: Admitting: Oncology

## 2024-02-25 ENCOUNTER — Inpatient Hospital Stay

## 2024-05-10 ENCOUNTER — Encounter (INDEPENDENT_AMBULATORY_CARE_PROVIDER_SITE_OTHER)

## 2024-05-10 ENCOUNTER — Ambulatory Visit (INDEPENDENT_AMBULATORY_CARE_PROVIDER_SITE_OTHER): Admitting: Vascular Surgery
